# Patient Record
Sex: Female | Born: 1940 | Race: White | Hispanic: No | Smoking: Former smoker
Health system: Southern US, Community
[De-identification: ages and names within clinical notes are randomized; demographics above are authoritative.]

## PROBLEM LIST (undated history)

## (undated) DIAGNOSIS — K2970 Gastritis, unspecified, without bleeding: Secondary | ICD-10-CM

## (undated) DIAGNOSIS — K449 Diaphragmatic hernia without obstruction or gangrene: Secondary | ICD-10-CM

## (undated) DIAGNOSIS — N189 Chronic kidney disease, unspecified: Principal | ICD-10-CM

## (undated) DIAGNOSIS — H269 Unspecified cataract: Secondary | ICD-10-CM

## (undated) DIAGNOSIS — I10 Essential (primary) hypertension: Secondary | ICD-10-CM

## (undated) DIAGNOSIS — K56609 Unspecified intestinal obstruction, unspecified as to partial versus complete obstruction: Secondary | ICD-10-CM

## (undated) DIAGNOSIS — E669 Obesity, unspecified: Secondary | ICD-10-CM

## (undated) DIAGNOSIS — E785 Hyperlipidemia, unspecified: Secondary | ICD-10-CM

## (undated) DIAGNOSIS — N200 Calculus of kidney: Secondary | ICD-10-CM

## (undated) DIAGNOSIS — M549 Dorsalgia, unspecified: Secondary | ICD-10-CM

## (undated) DIAGNOSIS — I319 Disease of pericardium, unspecified: Secondary | ICD-10-CM

## (undated) DIAGNOSIS — R011 Cardiac murmur, unspecified: Secondary | ICD-10-CM

## (undated) DIAGNOSIS — M3500 Sicca syndrome, unspecified: Secondary | ICD-10-CM

## (undated) DIAGNOSIS — E538 Deficiency of other specified B group vitamins: Secondary | ICD-10-CM

## (undated) DIAGNOSIS — M8700 Idiopathic aseptic necrosis of unspecified bone: Secondary | ICD-10-CM

## (undated) DIAGNOSIS — E119 Type 2 diabetes mellitus without complications: Secondary | ICD-10-CM

## (undated) DIAGNOSIS — F419 Anxiety disorder, unspecified: Secondary | ICD-10-CM

## (undated) DIAGNOSIS — D631 Anemia in chronic kidney disease: Principal | ICD-10-CM

## (undated) DIAGNOSIS — I4891 Unspecified atrial fibrillation: Secondary | ICD-10-CM

## (undated) DIAGNOSIS — G8929 Other chronic pain: Secondary | ICD-10-CM

## (undated) DIAGNOSIS — Z87442 Personal history of urinary calculi: Secondary | ICD-10-CM

## (undated) DIAGNOSIS — K219 Gastro-esophageal reflux disease without esophagitis: Secondary | ICD-10-CM

## (undated) DIAGNOSIS — M810 Age-related osteoporosis without current pathological fracture: Secondary | ICD-10-CM

## (undated) DIAGNOSIS — K222 Esophageal obstruction: Secondary | ICD-10-CM

## (undated) DIAGNOSIS — Z9289 Personal history of other medical treatment: Secondary | ICD-10-CM

## (undated) DIAGNOSIS — Z950 Presence of cardiac pacemaker: Secondary | ICD-10-CM

## (undated) DIAGNOSIS — D649 Anemia, unspecified: Secondary | ICD-10-CM

## (undated) DIAGNOSIS — N183 Chronic kidney disease, stage 3 unspecified: Secondary | ICD-10-CM

## (undated) DIAGNOSIS — K31819 Angiodysplasia of stomach and duodenum without bleeding: Secondary | ICD-10-CM

## (undated) DIAGNOSIS — E079 Disorder of thyroid, unspecified: Secondary | ICD-10-CM

## (undated) DIAGNOSIS — Z8739 Personal history of other diseases of the musculoskeletal system and connective tissue: Secondary | ICD-10-CM

## (undated) DIAGNOSIS — F32A Depression, unspecified: Secondary | ICD-10-CM

## (undated) DIAGNOSIS — H409 Unspecified glaucoma: Secondary | ICD-10-CM

## (undated) DIAGNOSIS — M1990 Unspecified osteoarthritis, unspecified site: Secondary | ICD-10-CM

## (undated) DIAGNOSIS — K5790 Diverticulosis of intestine, part unspecified, without perforation or abscess without bleeding: Secondary | ICD-10-CM

## (undated) DIAGNOSIS — F329 Major depressive disorder, single episode, unspecified: Secondary | ICD-10-CM

## (undated) HISTORY — DX: Deficiency of other specified B group vitamins: E53.8

## (undated) HISTORY — PX: REVISION TOTAL KNEE ARTHROPLASTY: SUR1280

## (undated) HISTORY — DX: Unspecified atrial fibrillation: I48.91

## (undated) HISTORY — DX: Unspecified osteoarthritis, unspecified site: M19.90

## (undated) HISTORY — PX: INSERT / REPLACE / REMOVE PACEMAKER: SUR710

## (undated) HISTORY — PX: ABDOMINAL HYSTERECTOMY: SHX81

## (undated) HISTORY — DX: Chronic kidney disease, unspecified: N18.9

## (undated) HISTORY — DX: Disease of pericardium, unspecified: I31.9

## (undated) HISTORY — PX: KNEE ARTHROSCOPY: SHX127

## (undated) HISTORY — PX: CHOLECYSTECTOMY OPEN: SUR202

## (undated) HISTORY — DX: Disorder of thyroid, unspecified: E07.9

## (undated) HISTORY — DX: Unspecified intestinal obstruction, unspecified as to partial versus complete obstruction: K56.609

## (undated) HISTORY — DX: Age-related osteoporosis without current pathological fracture: M81.0

## (undated) HISTORY — DX: Esophageal obstruction: K22.2

## (undated) HISTORY — DX: Diaphragmatic hernia without obstruction or gangrene: K44.9

## (undated) HISTORY — DX: Angiodysplasia of stomach and duodenum without bleeding: K31.819

## (undated) HISTORY — DX: Unspecified glaucoma: H40.9

## (undated) HISTORY — PX: JOINT REPLACEMENT: SHX530

## (undated) HISTORY — PX: ESOPHAGOGASTRODUODENOSCOPY (EGD) WITH ESOPHAGEAL DILATION: SHX5812

## (undated) HISTORY — PX: TOTAL KNEE ARTHROPLASTY: SHX125

## (undated) HISTORY — PX: EYE SURGERY: SHX253

## (undated) HISTORY — DX: Gastritis, unspecified, without bleeding: K29.70

## (undated) HISTORY — DX: Diverticulosis of intestine, part unspecified, without perforation or abscess without bleeding: K57.90

## (undated) HISTORY — PX: COLECTOMY: SHX59

## (undated) HISTORY — DX: Anemia in chronic kidney disease: D63.1

---

## 1997-03-06 ENCOUNTER — Encounter: Payer: Self-pay | Admitting: Internal Medicine

## 1999-03-24 ENCOUNTER — Inpatient Hospital Stay (HOSPITAL_COMMUNITY): Admission: RE | Admit: 1999-03-24 | Discharge: 1999-03-31 | Payer: Self-pay | Admitting: Orthopedic Surgery

## 1999-03-24 ENCOUNTER — Encounter: Payer: Self-pay | Admitting: Orthopedic Surgery

## 1999-03-30 ENCOUNTER — Encounter: Payer: Self-pay | Admitting: Orthopedic Surgery

## 1999-03-31 ENCOUNTER — Inpatient Hospital Stay (HOSPITAL_COMMUNITY)
Admission: RE | Admit: 1999-03-31 | Discharge: 1999-04-05 | Payer: Self-pay | Admitting: Physical Medicine & Rehabilitation

## 1999-04-02 ENCOUNTER — Encounter: Payer: Self-pay | Admitting: Physical Medicine & Rehabilitation

## 1999-08-01 ENCOUNTER — Encounter: Payer: Self-pay | Admitting: *Deleted

## 1999-08-01 ENCOUNTER — Encounter: Admission: RE | Admit: 1999-08-01 | Discharge: 1999-08-01 | Payer: Self-pay | Admitting: *Deleted

## 2001-01-13 ENCOUNTER — Encounter: Admission: RE | Admit: 2001-01-13 | Discharge: 2001-01-13 | Payer: Self-pay | Admitting: Internal Medicine

## 2001-01-13 ENCOUNTER — Encounter: Payer: Self-pay | Admitting: Internal Medicine

## 2001-09-30 ENCOUNTER — Other Ambulatory Visit: Admission: RE | Admit: 2001-09-30 | Discharge: 2001-09-30 | Payer: Self-pay | Admitting: Internal Medicine

## 2002-11-22 ENCOUNTER — Ambulatory Visit (HOSPITAL_COMMUNITY): Admission: RE | Admit: 2002-11-22 | Discharge: 2002-11-22 | Payer: Self-pay | Admitting: Internal Medicine

## 2003-01-12 ENCOUNTER — Ambulatory Visit (HOSPITAL_COMMUNITY): Admission: RE | Admit: 2003-01-12 | Discharge: 2003-01-12 | Payer: Self-pay | Admitting: Internal Medicine

## 2003-01-18 ENCOUNTER — Encounter: Payer: Self-pay | Admitting: Internal Medicine

## 2003-01-18 ENCOUNTER — Encounter: Admission: RE | Admit: 2003-01-18 | Discharge: 2003-01-18 | Payer: Self-pay | Admitting: Internal Medicine

## 2003-01-31 ENCOUNTER — Encounter: Admission: RE | Admit: 2003-01-31 | Discharge: 2003-03-28 | Payer: Self-pay | Admitting: Internal Medicine

## 2003-07-26 ENCOUNTER — Other Ambulatory Visit: Admission: RE | Admit: 2003-07-26 | Discharge: 2003-07-26 | Payer: Self-pay | Admitting: Internal Medicine

## 2003-08-07 ENCOUNTER — Encounter: Admission: RE | Admit: 2003-08-07 | Discharge: 2003-08-07 | Payer: Self-pay | Admitting: Internal Medicine

## 2004-04-25 ENCOUNTER — Encounter: Admission: RE | Admit: 2004-04-25 | Discharge: 2004-04-25 | Payer: Self-pay | Admitting: Internal Medicine

## 2005-03-24 ENCOUNTER — Other Ambulatory Visit: Admission: RE | Admit: 2005-03-24 | Discharge: 2005-03-24 | Payer: Self-pay | Admitting: Internal Medicine

## 2007-01-24 ENCOUNTER — Encounter: Admission: RE | Admit: 2007-01-24 | Discharge: 2007-01-24 | Payer: Self-pay | Admitting: Internal Medicine

## 2007-08-17 ENCOUNTER — Encounter: Payer: Self-pay | Admitting: Internal Medicine

## 2007-09-26 ENCOUNTER — Other Ambulatory Visit: Admission: RE | Admit: 2007-09-26 | Discharge: 2007-09-26 | Payer: Self-pay | Admitting: Family Medicine

## 2007-09-26 ENCOUNTER — Encounter: Payer: Self-pay | Admitting: Internal Medicine

## 2007-10-03 ENCOUNTER — Encounter: Payer: Self-pay | Admitting: Internal Medicine

## 2007-10-25 ENCOUNTER — Telehealth: Payer: Self-pay | Admitting: Internal Medicine

## 2007-11-21 DIAGNOSIS — E669 Obesity, unspecified: Secondary | ICD-10-CM | POA: Insufficient documentation

## 2007-11-21 DIAGNOSIS — K56609 Unspecified intestinal obstruction, unspecified as to partial versus complete obstruction: Secondary | ICD-10-CM | POA: Insufficient documentation

## 2007-11-21 DIAGNOSIS — M1990 Unspecified osteoarthritis, unspecified site: Secondary | ICD-10-CM | POA: Insufficient documentation

## 2007-11-21 DIAGNOSIS — Z862 Personal history of diseases of the blood and blood-forming organs and certain disorders involving the immune mechanism: Secondary | ICD-10-CM | POA: Insufficient documentation

## 2007-11-21 DIAGNOSIS — Z8639 Personal history of other endocrine, nutritional and metabolic disease: Secondary | ICD-10-CM

## 2007-11-21 DIAGNOSIS — E538 Deficiency of other specified B group vitamins: Secondary | ICD-10-CM | POA: Insufficient documentation

## 2007-11-21 DIAGNOSIS — K449 Diaphragmatic hernia without obstruction or gangrene: Secondary | ICD-10-CM | POA: Insufficient documentation

## 2007-11-21 DIAGNOSIS — Z8679 Personal history of other diseases of the circulatory system: Secondary | ICD-10-CM | POA: Insufficient documentation

## 2007-11-21 DIAGNOSIS — K5730 Diverticulosis of large intestine without perforation or abscess without bleeding: Secondary | ICD-10-CM | POA: Insufficient documentation

## 2007-11-21 DIAGNOSIS — K222 Esophageal obstruction: Secondary | ICD-10-CM | POA: Insufficient documentation

## 2007-11-21 DIAGNOSIS — Z8719 Personal history of other diseases of the digestive system: Secondary | ICD-10-CM | POA: Insufficient documentation

## 2007-11-22 ENCOUNTER — Ambulatory Visit: Payer: Self-pay | Admitting: Internal Medicine

## 2008-01-04 ENCOUNTER — Inpatient Hospital Stay (HOSPITAL_COMMUNITY): Admission: RE | Admit: 2008-01-04 | Discharge: 2008-01-09 | Payer: Self-pay | Admitting: Orthopedic Surgery

## 2009-01-09 ENCOUNTER — Inpatient Hospital Stay (HOSPITAL_COMMUNITY): Admission: RE | Admit: 2009-01-09 | Discharge: 2009-01-14 | Payer: Self-pay | Admitting: Orthopedic Surgery

## 2009-01-14 ENCOUNTER — Encounter (INDEPENDENT_AMBULATORY_CARE_PROVIDER_SITE_OTHER): Payer: Self-pay | Admitting: *Deleted

## 2009-01-26 ENCOUNTER — Inpatient Hospital Stay (HOSPITAL_COMMUNITY): Admission: EM | Admit: 2009-01-26 | Discharge: 2009-02-01 | Payer: Self-pay | Admitting: Emergency Medicine

## 2009-01-26 ENCOUNTER — Encounter (INDEPENDENT_AMBULATORY_CARE_PROVIDER_SITE_OTHER): Payer: Self-pay | Admitting: *Deleted

## 2009-01-29 ENCOUNTER — Encounter (INDEPENDENT_AMBULATORY_CARE_PROVIDER_SITE_OTHER): Payer: Self-pay | Admitting: *Deleted

## 2009-02-12 ENCOUNTER — Encounter: Payer: Self-pay | Admitting: Internal Medicine

## 2009-02-13 ENCOUNTER — Encounter: Payer: Self-pay | Admitting: Internal Medicine

## 2009-02-13 ENCOUNTER — Encounter (INDEPENDENT_AMBULATORY_CARE_PROVIDER_SITE_OTHER): Payer: Self-pay | Admitting: *Deleted

## 2009-02-15 ENCOUNTER — Ambulatory Visit (HOSPITAL_COMMUNITY): Admission: RE | Admit: 2009-02-15 | Discharge: 2009-02-15 | Payer: Self-pay | Admitting: Internal Medicine

## 2009-02-15 ENCOUNTER — Encounter (INDEPENDENT_AMBULATORY_CARE_PROVIDER_SITE_OTHER): Payer: Self-pay | Admitting: *Deleted

## 2009-02-19 ENCOUNTER — Ambulatory Visit: Payer: Self-pay | Admitting: Internal Medicine

## 2009-02-19 DIAGNOSIS — K630 Abscess of intestine: Secondary | ICD-10-CM | POA: Insufficient documentation

## 2009-02-21 ENCOUNTER — Telehealth: Payer: Self-pay | Admitting: Internal Medicine

## 2009-02-27 ENCOUNTER — Encounter: Payer: Self-pay | Admitting: Internal Medicine

## 2009-02-27 ENCOUNTER — Encounter (INDEPENDENT_AMBULATORY_CARE_PROVIDER_SITE_OTHER): Payer: Self-pay | Admitting: *Deleted

## 2009-03-22 ENCOUNTER — Ambulatory Visit: Payer: Self-pay | Admitting: Internal Medicine

## 2009-03-26 ENCOUNTER — Telehealth: Payer: Self-pay | Admitting: Internal Medicine

## 2009-03-27 ENCOUNTER — Ambulatory Visit: Payer: Self-pay | Admitting: Internal Medicine

## 2009-03-27 DIAGNOSIS — R1900 Intra-abdominal and pelvic swelling, mass and lump, unspecified site: Secondary | ICD-10-CM | POA: Insufficient documentation

## 2009-03-29 ENCOUNTER — Encounter: Payer: Self-pay | Admitting: Internal Medicine

## 2009-04-09 ENCOUNTER — Ambulatory Visit: Admission: RE | Admit: 2009-04-09 | Discharge: 2009-04-09 | Payer: Self-pay | Admitting: Gynecologic Oncology

## 2009-06-03 ENCOUNTER — Ambulatory Visit: Payer: Self-pay | Admitting: Internal Medicine

## 2009-07-16 ENCOUNTER — Ambulatory Visit (HOSPITAL_COMMUNITY): Admission: RE | Admit: 2009-07-16 | Discharge: 2009-07-16 | Payer: Self-pay | Admitting: Gynecologic Oncology

## 2009-07-17 ENCOUNTER — Ambulatory Visit: Admission: RE | Admit: 2009-07-17 | Discharge: 2009-07-17 | Payer: Self-pay | Admitting: Gynecologic Oncology

## 2009-07-17 ENCOUNTER — Encounter: Payer: Self-pay | Admitting: Internal Medicine

## 2009-07-23 ENCOUNTER — Telehealth: Payer: Self-pay | Admitting: Internal Medicine

## 2009-08-02 ENCOUNTER — Telehealth: Payer: Self-pay | Admitting: Internal Medicine

## 2009-08-08 ENCOUNTER — Telehealth: Payer: Self-pay | Admitting: Internal Medicine

## 2009-08-15 ENCOUNTER — Encounter (INDEPENDENT_AMBULATORY_CARE_PROVIDER_SITE_OTHER): Payer: Self-pay | Admitting: *Deleted

## 2009-08-15 ENCOUNTER — Encounter: Admission: RE | Admit: 2009-08-15 | Discharge: 2009-08-15 | Payer: Self-pay | Admitting: Family Medicine

## 2009-08-22 ENCOUNTER — Telehealth: Payer: Self-pay | Admitting: Internal Medicine

## 2009-08-22 ENCOUNTER — Encounter: Payer: Self-pay | Admitting: Internal Medicine

## 2009-09-26 ENCOUNTER — Ambulatory Visit: Admission: RE | Admit: 2009-09-26 | Discharge: 2009-09-26 | Payer: Self-pay | Admitting: Gynecologic Oncology

## 2009-10-08 ENCOUNTER — Inpatient Hospital Stay (HOSPITAL_COMMUNITY): Admission: RE | Admit: 2009-10-08 | Discharge: 2009-10-23 | Payer: Self-pay | Admitting: General Surgery

## 2009-10-08 ENCOUNTER — Encounter (INDEPENDENT_AMBULATORY_CARE_PROVIDER_SITE_OTHER): Payer: Self-pay | Admitting: General Surgery

## 2009-10-18 ENCOUNTER — Ambulatory Visit: Payer: Self-pay | Admitting: Physical Medicine & Rehabilitation

## 2009-10-25 ENCOUNTER — Emergency Department (HOSPITAL_COMMUNITY)
Admission: EM | Admit: 2009-10-25 | Discharge: 2009-10-25 | Payer: Self-pay | Source: Home / Self Care | Admitting: Emergency Medicine

## 2009-10-29 ENCOUNTER — Ambulatory Visit (HOSPITAL_COMMUNITY): Admission: RE | Admit: 2009-10-29 | Discharge: 2009-10-29 | Payer: Self-pay | Admitting: Internal Medicine

## 2010-01-27 ENCOUNTER — Encounter: Admission: RE | Admit: 2010-01-27 | Discharge: 2010-01-27 | Payer: Self-pay | Admitting: General Surgery

## 2010-03-19 ENCOUNTER — Inpatient Hospital Stay (HOSPITAL_COMMUNITY): Admission: RE | Admit: 2010-03-19 | Discharge: 2010-03-23 | Payer: Self-pay | Admitting: General Surgery

## 2010-03-19 ENCOUNTER — Encounter (INDEPENDENT_AMBULATORY_CARE_PROVIDER_SITE_OTHER): Payer: Self-pay | Admitting: General Surgery

## 2010-07-22 NOTE — Consult Note (Signed)
Summary: Abdominal Pain  NAME:  Tricia Gonzales, Tricia Gonzales NO.:  [id number]      MEDICAL RECORD NO.:  [id number]          PATIENT TYPE:  INP      LOCATION:  1522                         FACILITY:  [facility]      PHYSICIAN:  Marland Kitchen T. Hoxworth, M.D.DATE OF BIRTH:  1941/02/21      DATE OF CONSULTATION:  01/29/2009   DATE OF DISCHARGE:                                    CONSULTATION      REFERRING PHYSICIAN:  [facility] hospitalist.      CHIEF COMPLAINT:  Abdominal pain.      HISTORY:  I was asked by the [facility] hospitalist to evaluate Tricia Gonzales.   She is a very pleasant 70 year old white female with a number of   significant medical problems who was admitted to the hospital on August   8 with abdominal pain.  The patient states she has a history of   diverticulitis about 15 years ago which was treated with outpatient   antibiotics.  She underwent colonoscopy at that time.  Since then she   has had minimal difficulty with just occasional mild lower abdominal   pain that she will manage by restricting her diet and it quickly   resolves.  She has not required any further medical treatment until now.      The patient is status post recent redo left total knee replacement by   Dr. Wynelle Link on July 21.  She states that around the time of that   surgery, probably due to pain medication, she developed some   constipation and then had some recurrent pain in her lower abdomen left   greater than right.  This started significantly about 5-6 days prior to   admission.  It was persistent pain, moderately severe.  She was being   seen by home health from her knee surgery and some at least low grade   fever was documented.  She presented for evaluation and was admitted on   August 8 based on CT findings of diverticulitis and abscess as described   below.      The patient has been on IV antibiotics since admission and states she is   feeling much better than admission with now relatively mild  occasional   pain.  She has had some loose stools since admission.  No current fever   or chills.  Tolerating liquids without difficulty.  She has not noted   any melena or hematochezia.  Last colonoscopy was about 12-15 years ago.      PAST MEDICAL HISTORY:  Medically she is followed for:   1. Morbid obesity.   2. Hypertension.   3. Type 2 diabetes.   4. Hyperlipidemia.      SURGERY:  Includes previous open cholecystectomy years ago and previous   knee replacements on each side.      MEDICATIONS ON ADMISSION:   1. She was on Coumadin for prophylaxis following her knee replacement.   2. Glipizide 10 mg b.i.d.   3. Neurontin 300 b.i.d.   4. Metoprolol 100  mg daily.   5. Cardura 4 mg daily.   6. Robaxin p.r.n.   7. Lasix 40 mg daily.   8. Trental 400 mg daily.   9. Protonix 40 daily.   10.Trazodone 50 at night.   11.Zocor 20 mg daily.   12.Since admission she is on Cipro 400 mg IV every 12 hours.   13.Flagyl 500 mg IV every 8 hours.      ALLERGIES:  NORVASC, METFORMIN AND CONTRAST DYE.      SOCIAL HISTORY:  Lives alone.  Does not smoke cigarettes, drinks   moderate amount of alcohol.      FAMILY HISTORY:  Noncontributory.      REVIEW OF SYSTEMS:  GENERAL:  Some malaise and low grade fever with this   illness.  HEENT:  Denies vision, hearing, swallowing problems.   RESPIRATORY:  Denies shortness of breath, cough, wheezing.  CARDIAC:   Denies chest pain, palpitations, history of heart disease.  ABDOMEN/GI:   As above.  GU:  She has had a moderate amount of urinary hesitancy with   her knee replacement.  No burning, frequency, pneumaturia.  EXTREMITIES:   Positive arthritis pain, difficult mobility.  HEMATOLOGIC:  Denies   history of blood clots, abnormal bleeding.      PHYSICAL EXAM:  Temperature is 98.6, respirations 16, heart rate 72,   blood pressure 102/62.   GENERAL:  Pleasant, morbidly obese female, no acute distress.   SKIN:  Warm, dry.  No rash or infection.    HEENT:  No palpable mass or thyromegaly.  Sclerae nonicteric.   Oropharynx clear.   LYMPH NODES.  No cervical, subclavicular or inguinal nodes palpable.   LUNGS:  Clear without wheezing or increased work of breathing.   CARDIAC:  Regular rate and rhythm.  No significant edema or JVD noted.   ABDOMEN:  Obese.  No hernias.  Well-healed Kocher incision.  Soft   without apparent tenderness.  No discernible masses, but limited due to   obesity.   EXTREMITIES:  Healing incision left knee with minimal erythema.   NEUROLOGIC:  Alert, oriented.  Motor and sensory exams grossly normal.      LABORATORY:  Currently white count is 9.4, hemoglobin is 86.   Electrolytes, BUN, creatinine unremarkable.  Stool for occult blood was   negative.  CA-125 within normal limits.      IMAGING:  CT scan of the abdomen and pelvis was reviewed.  This shows   diverticulosis of the left and sigmoid colon.  There is a 4 x 4.1 cm   collection of air and fluid adjacent to the sigmoid colon consistent   with a pericolonic diverticular abscess.  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      ASSESSMENT/PLAN:  A 70 year old female with multiple medical problems   status post recent total knee replacement.  She has an apparent small   peridiverticular abscess.  This was not felt amenable to percutaneous   drainage by Interventional Radiology but is small enough that it could   very likely be adequately treated with antibiotics alone.  She has never   been hospitalized for diverticulitis previously and has had minimal   ongoing symptoms.  She would be at increased risk for colectomy due to   her morbid obesity and other medical problems.   1. At this point I  would not recommend surgery unless her abscess       fails to resolve or she has ongoing significant symptoms.   2. She will require a colonoscopy once this  resolves.   3. She will require further evaluation of her ovarian mass which is       being arranged by the hospitalist team.   4. I would think she is nearing time for discharge and could follow up       with oral antibiotics and repeat CT scan as an outpatient in 2-3       weeks and I would be happy to see her back in the office at that       time.  We will follow with you.               Darene Lamer. Hoxworth, M.D.   Electronically Signed            BTH/MEDQ  D:  01/29/2009  T:  01/29/2009  Job:  [id number]

## 2010-07-22 NOTE — Assessment & Plan Note (Signed)
Summary: f/u./...em   History of Present Illness Visit Type: follow up  Primary GI MD: Delfin Edis MD Primary Provider: Kelton Pillar, MD Requesting Provider: n/a Chief Complaint: F/u from arm and shoulder pain from antibiotics. Pt states that she is better and denies any GI complaints  History of Present Illness:   This is a 70 year old white female with a history of diverticulitis and a diverticular abscess in July 2010. She is status post hospitalization and surgical evaluation. Patient has improved on several courses of antibiotics and did not need surgery. She is a high risk for surgery because of her obesity and other medical issues. Her last CT scan of the abdomen in October 2010 showed almost complete resolution of the abscess but a tiny residual collection filled with contrast connecting with the colon consistent with a contained leak. She has a pelvic mass and is seen by Dr.Soper and followed with vaginal ultrasound. Our last attempt for a colonoscopy in 1998 showed severe diverticulosis of the left colon. It was an incomplete exam. A subsequent barium enema showed severe diverticulosis. Patient is due for a repeat colonoscopy.   GI Review of Systems      Denies abdominal pain, acid reflux, belching, bloating, chest pain, dysphagia with liquids, dysphagia with solids, heartburn, loss of appetite, nausea, vomiting, vomiting blood, weight loss, and  weight gain.        Denies anal fissure, black tarry stools, change in bowel habit, constipation, diarrhea, diverticulosis, fecal incontinence, heme positive stool, hemorrhoids, irritable bowel syndrome, jaundice, light color stool, liver problems, rectal bleeding, and  rectal pain.    Current Medications (verified): 1)  Vicodin 5-500 Mg  Tabs (Hydrocodone-Acetaminophen) .... As Needed 2)  Glipizide 10 Mg  Tabs (Glipizide) .... Two Times A Day 3)  Actos 30 Mg  Tabs (Pioglitazone Hcl) .... Once Daily 4)  Metoprolol Succinate 50 Mg  Tb24  (Metoprolol Succinate) .... Two Times A Day 5)  Cardura 4 Mg  Tabs (Doxazosin Mesylate) .... Once Daily 6)  Furosemide 40 Mg  Tabs (Furosemide) .... Once Daily 7)  Trental 400 Mg  Tbcr (Pentoxifylline) .... Once Daily 8)  Baby Aspirin 81 Mg  Chew (Aspirin) .... Once Daily 9)  Protonix 40 Mg  Tbec (Pantoprazole Sodium) .... Once Daily 10)  Neurontin 300 Mg  Caps (Gabapentin) .... Two Times A Day 11)  Zocor 20 Mg  Tabs (Simvastatin) .... Take 1/2 Daily 12)  Trazamine 50 Mg  Misc (Trazodone & Diet Manage Prod) .... Take 1-2 Daily  Allergies (verified): 1)  ! Norvasc  Past History:  Past Medical History: Reviewed history from 11/21/2007 and no changes required. Current Problems:  DIABETES MELLITUS, HX OF (ICD-V12.2) HYPERTENSION, HX OF (ICD-V12.50) OSTEOARTHRITIS (ICD-715.90) ANEMIA, HX OF (ICD-V12.3) Hx of UNSPECIFIED INTESTINAL OBSTRUCTION (ICD-560.9) HIATAL HERNIA (ICD-553.3) REFLUX ESOPHAGITIS, HX OF (ICD-V12.79) DIVERTICULOSIS OF COLON (ICD-562.10) Hx of ESOPHAGEAL STRICTURE (ICD-530.3) OBESITY (ICD-278.00) VITAMIN B12 DEFICIENCY (ICD-266.2)  Past Surgical History: Reviewed history from 11/21/2007 and no changes required. cholecystectomy knee surgery  Family History: Reviewed history from 02/19/2009 and no changes required. Family History of Diabetes: Mother and brother  No FH of Colon Cancer:  Social History: Reviewed history from 02/19/2009 and no changes required. Patient is a former smoker.  Occupation: Electrical engineer Alcohol Use - yes Daily Caffeine Use Patient does not get regular exercise.   Review of Systems  The patient denies allergy/sinus, anemia, anxiety-new, arthritis/joint pain, back pain, blood in urine, breast changes/lumps, change in vision, confusion, cough, coughing up blood,  depression-new, fainting, fatigue, fever, headaches-new, hearing problems, heart murmur, heart rhythm changes, itching, menstrual pain, muscle pains/cramps, night sweats,  nosebleeds, pregnancy symptoms, shortness of breath, skin rash, sleeping problems, sore throat, swelling of feet/legs, swollen lymph glands, thirst - excessive , urination - excessive , urination changes/pain, urine leakage, vision changes, and voice change.         Pertinent positive and negative review of systems were noted in the above HPI. All other ROS was otherwise negative.   Vital Signs:  Patient profile:   70 year old female Height:      60 inches Weight:      256 pounds BMI:     50.18 BSA:     2.07 Pulse rate:   62 / minute Pulse rhythm:   regular BP sitting:   126 / 72  (left arm) Cuff size:   large  Vitals Entered By: Hope Pigeon CMA (June 03, 2009 11:44 AM)  Physical Exam  General:  Well developed, well nourished, no acute distress. Eyes:  PERRLA, no icterus. Mouth:  No deformity or lesions, dentition normal. Neck:  Supple; no masses or thyromegaly. Lungs:  Clear throughout to auscultation. Heart:  Regular rate and rhythm; no murmurs, rubs,  or bruits. Abdomen:  obese soft abdomen with well-healed surgical scars and normoactive bowel sounds. No tenderness and no palpable mass. Skin:  Intact without significant lesions or rashes. Psych:  Alert and cooperative. Normal mood and affect.   Impression & Recommendations:  Problem # 1:  ABSCESS OF INTESTINE (ICD-569.5) Patient is status post diverticular abscess with  resolution. She has had severe diverticulosis of the left column since 1998. Patient is doing well and remains asymptomatic. She will increase her fiber intake and will call us if the pain recurs. She has amoxicillin at home which she may take at the onset of pain. Patient is due for a colonoscopy at this time. However, we will wait until March 2011 to send her a recall letter. We will have to use a pediatric scope .  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 She has a vaginal sonogram scheduled for  06/2009.  Patient Instructions: 1)  recall colonoscopy March 2011; plan to use pediatric scope. 2)  Increase fiber intake. 3)  Call if abdominal pain recurs. 4)  Vaginal ultrasound January 2011 scheduled for followup of pelvic mass. 5)  Copy sent to : Dr Kelton Pillar, Dr Clarene Essex

## 2010-07-22 NOTE — Letter (Signed)
Summary: Tricia Gonzales  Kelton Pillar, MD   Imported By: Awilda Bill CMA (AAMA) 02/14/2009 10:47:48  _____________________________________________________________________  External Attachment:    Type:   Image     Comment:   External Document

## 2010-07-22 NOTE — Progress Notes (Signed)
Summary: TRIAGE  Phone Note Call from Patient Call back at Home Phone [phone]   Call For: Dr Olevia Perches Reason for Call: Talk to Nurse Summary of Call: Has been on Flagyl for 7days for her flare up and she feels that she is not any better. What else can she do?  Initial call taken by: Irwin Brakeman [facility],  August 02, 2009 9:14 AM  Follow-up for Phone Call        See triage from 07-23-09.Now with very low abd. pain and pressure. Denies constipation, diarrhea, blood, black stools, n/v. States she has trouble voiding for several days, frequency and some painful urination.   1) Please call your PCP ASAP, sounds like a UTI. 2) Complete your antibiotics we ordered and callback with an update on Monday. 3) If symptoms become worse call back immediately or go to ER.  Follow-up by: Vivia Ewing LPN,  August 02, 2261 9:33 AM  Additional Follow-up for Phone Call Additional follow up Details #1::        OK Additional Follow-up by: Lafayette Dragon MD,  August 02, 2009 4:06 PM

## 2010-07-22 NOTE — Letter (Signed)
Summary: [facility]   Imported By: Bubba Hales 04/05/2009 13:49:04  _____________________________________________________________________  External Attachment:    Type:   Image     Comment:   External Document

## 2010-07-22 NOTE — Progress Notes (Signed)
Summary: flare up  Phone Note Call from Patient Call back at Home Phone [phone]   Call For: Dr Olevia Perches Reason for Call: Talk to Nurse Summary of Call: Diverticulitits flare up and was told Dr would order antibiotics for her the next time she got one uses [place]. Can we let her know when it has been done so she can call pharmacy to have them deliver the medicine to her house. Initial call taken by: Irwin Brakeman [facility],  July 23, 2009 11:17 AM  Follow-up for Phone Call        Pain in LLQ that started Saturday night.  She  describes as pressure and sharp. She took a laxative Saturday night due to constipation.  She had some amoxicillin at the house but was 2 pills and she is now out.  Per last office note from Dec she was to start on antibiotics at home and call for further direction.  Dr Olevia Perches Please advise. Follow-up by: Barb Merino RN, [place],  July 23, 2009 12:06 PM  Additional Follow-up for Phone Call Additional follow up Details #1::        Discussed with Dr Olevia Perches patient to resume cipro 500 mg two times a day and Flagyl 250 mg three times a day x 10 days.  Patient  to stay on Miralax.  Patient  is also scheduled for REV with Dr Olevia Perches for 08-26-09 9:15 Additional Follow-up by: Barb Merino RN, CGRN,  July 23, 2009 3:14 PM    New/Updated Medications: CIPROFLOXACIN HCL 500 MG TABS (CIPROFLOXACIN HCL) 1 by mouth two times a day METRONIDAZOLE 250 MG TABS (METRONIDAZOLE) 1 by mouth tid Prescriptions: METRONIDAZOLE 250 MG TABS (METRONIDAZOLE) 1 by mouth tid  #30 x 0   Entered by:   Barb Merino RN, CGRN   Authorized by:   Lafayette Dragon MD   Signed by:   Barb Merino RN, CGRN on 07/23/2009   Method used:   Electronically to        [facility] (retail)       [address], [place]  [id number]       Ph: [phone] or [phone]       Fax: [phone]   RxID:   [id number] CIPROFLOXACIN HCL 500 MG TABS (CIPROFLOXACIN HCL) 1 by  mouth two times a day  #20 x 0   Entered by:   Barb Merino RN, CGRN   Authorized by:   Lafayette Dragon MD   Signed by:   Barb Merino RN, CGRN on 07/23/2009   Method used:   Electronically to        [facility] (retail)       [address], [place]  [id number]       Ph: [phone] or [phone]       Fax: [phone]   RxID:   [id number]

## 2010-07-22 NOTE — Procedures (Signed)
Summary: Gastroenterology colon  Gastroenterology colon   Imported By: Awilda Bill CMA 11/21/2007 16:00:47  _____________________________________________________________________  External Attachment:    Type:   Image     Comment:   External Document

## 2010-07-22 NOTE — Letter (Signed)
Summary: [facility] progress report  [facility] progress report   Imported By: Marlon Pel CMA ([place]) 03/27/2009 09:30:54  _____________________________________________________________________  External Attachment:    Type:   Image     Comment:   External Document

## 2010-07-22 NOTE — Medication Information (Signed)
Summary: RX for Taentail/Semrastor/Glipizedxt/triosemide/Gluccostiol/[place]  RX for Taentail/Semrastor/Glipizedxt/triosemide/Gluccostiol/[facility]   Imported By: Adonis Huguenin 12/09/2007 11:10:08  _____________________________________________________________________  External Attachment:    Type:   Image     Comment:   External Document

## 2010-07-22 NOTE — Assessment & Plan Note (Signed)
Summary: NO APPETITE/DIARRHEA/FH   History of Present Illness Visit Type: consult  Primary GI MD: Delfin Edis MD Primary Provider: Kelton Pillar, MD Requesting Provider: Kelton Pillar, MD Chief Complaint: Pt states that she is here to f/u from CT. Pt denies any GI complaints History of Present Illness:   This is a 70 year old white female who has a history of constipation and severe left colon diverticulosis which was diagnosed on a colonoscopy in 1998. At that time, her colonoscopy exam was incomplete due to severe narrowing of the sigmoid colon. A subsequent barium enema showed severe tortuosity of the left colon with some narrowing. Patient was then recovering from an attack of diverticulitis. She has no family history of colon cancer. Patient was treated in 1998 for an esphageal stricture and gastroesophageal reflux. Patient was recently hospitalized due to a knee replacement. At that time, she began having abdominal pain. A CT scan was done and showed an apparent small peridiverticular abscess that was 4cmx4cm.  This was not felt amenable to percutaneous drainage by Interventional Radiology but was small enough that it could very likely be adequately treated with antibiotics alone. She was started on Cipro and Flagyl. She has never been hospitalized for diverticulitis previously and has had minimal ongoing symptoms. She was felt to be at increased risk for colectomy due to her morbid obesity and other medical problems.  Patient was later released from the hospital much improved. She comes today for a follow up. A repeat CT scan of the pelvis showed enlargement of the diverticular abscess from 4-6 cm in diameter. There was extraluminal fluid and gas collection in the central sigmoid colon. 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 Patient restarted her Flagyl and Cipro 3 days ago.   GI Review of Systems      Denies abdominal pain, acid reflux,  belching, bloating, chest pain, dysphagia with liquids, dysphagia with solids, heartburn, loss of appetite, nausea, vomiting, vomiting blood, weight loss, and  weight gain.        Denies anal fissure, black tarry stools, change in bowel habit, constipation, diarrhea, diverticulosis, fecal incontinence, heme positive stool, hemorrhoids, irritable bowel syndrome, jaundice, light color stool, liver problems, rectal bleeding, and  rectal pain.    Current Medications (verified): 1)  Vicodin 5-500 Mg  Tabs (Hydrocodone-Acetaminophen) .... As Needed 2)  Glipizide 10 Mg  Tabs (Glipizide) .... Two Times A Day 3)  Actos 30 Mg  Tabs (Pioglitazone Hcl) .... Once Daily 4)  Metoprolol Succinate 50 Mg  Tb24 (Metoprolol Succinate) .... Two Times A Day 5)  Cardura 4 Mg  Tabs (Doxazosin Mesylate) .... Once Daily 6)  Furosemide 40 Mg  Tabs (Furosemide) .... Once Daily 7)  Trental 400 Mg  Tbcr (Pentoxifylline) .... Once Daily 8)  Baby Aspirin 81 Mg  Chew (Aspirin) .... Once Daily 9)  Protonix 40 Mg  Tbec (Pantoprazole Sodium) .... Once Daily 10)  Neurontin 300 Mg  Caps (Gabapentin) .... Two Times A Day 11)  Zocor 20 Mg  Tabs (Simvastatin) .... Take 1/2 Daily 12)  Trazamine 50 Mg  Misc (Trazodone & Diet Manage Prod) .... Take 1-2 Daily 13)  Centrum Silver   Tabs (Multiple Vitamins-Minerals) .... Once Daily 14)  Vitamin D 2000 Unit  Tabs (Cholecalciferol) .... Once Daily 15)  Cipro 500 Mg Tabs (Ciprofloxacin Hcl) .... One Tablet By Mouth Two Times A Day 16)  Flagyl 500 Mg Tabs (Metronidazole) .... One Tablet By Mouth Three Times  A Day  Allergies (verified): 1)  ! Norvasc  Past History:  Past Medical History: Reviewed history from 11/21/2007 and no changes required. Current Problems:  DIABETES MELLITUS, HX OF (ICD-V12.2) HYPERTENSION, HX OF (ICD-V12.50) OSTEOARTHRITIS (ICD-715.90) ANEMIA, HX OF (ICD-V12.3) Hx of UNSPECIFIED INTESTINAL OBSTRUCTION (ICD-560.9) HIATAL HERNIA (ICD-553.3) REFLUX  ESOPHAGITIS, HX OF (ICD-V12.79) DIVERTICULOSIS OF COLON (ICD-562.10) Hx of ESOPHAGEAL STRICTURE (ICD-530.3) OBESITY (ICD-278.00) VITAMIN B12 DEFICIENCY (ICD-266.2)  Past Surgical History: Reviewed history from 11/21/2007 and no changes required. cholecystectomy knee surgery  Family History: Family History of Diabetes: Mother and brother  No FH of Colon Cancer:  Social History: Reviewed history from 11/22/2007 and no changes required. Patient is a former smoker.  Occupation: Electrical engineer Alcohol Use - yes Daily Caffeine Use Patient does not get regular exercise.   Review of Systems  The patient denies allergy/sinus, anemia, anxiety-new, arthritis/joint pain, back pain, blood in urine, breast changes/lumps, change in vision, confusion, cough, coughing up blood, depression-new, fainting, fatigue, fever, headaches-new, hearing problems, heart murmur, heart rhythm changes, itching, menstrual pain, muscle pains/cramps, night sweats, nosebleeds, pregnancy symptoms, shortness of breath, skin rash, sleeping problems, sore throat, swelling of feet/legs, swollen lymph glands, thirst - excessive , urination - excessive , urination changes/pain, urine leakage, vision changes, and voice change.         Pertinent positive and negative review of systems were noted in the above HPI. All other ROS was otherwise negative.   Vital Signs:  Patient profile:   70 year old female Height:      60 inches Weight:      268 pounds BMI:     52.53 BSA:     2.12 Pulse rate:   62 / minute Pulse rhythm:   regular BP sitting:   122 / 74  (left arm) Cuff size:   regular  Vitals Entered By: Hope Pigeon [place] (February 19, 2009 4:20 PM)  Physical Exam  General:  morbidly obese, alert and oriented. She moves around with a walker. Eyes:  PERRLA, no icterus. Mouth:  No deformity or lesions, dentition normal. Neck:  Supple; no masses or thyromegaly. Lungs:  Clear throughout to auscultation. Heart:  Regular rate  and rhythm; no murmurs, rubs,  or bruits. Abdomen:  massively obese abdomen with normoactive bowel sounds and focal tenderness in left lower quadrant. There was no rebound. Rectal:  patient declined rectal exam. Extremities:  2+ edema with stasis dermatitis. Psych:  Alert and cooperative. Normal mood and affect.   Impression & Recommendations:  Problem # 1:  DIVERTICULOSIS OF COLON (ICD-562.10) Diverticular abscess in the left parasigmoid space enlarged from 4-6 cm in diameter despite patient feeling clinically improved. She still has periods of nausea and bloating which may be related to Flagyl. We will continue her current antibiotics and repeat the CT scan in 4 weeks. Decrease Flagyl  to 250 mg 3 times a day. If the abscess does not resolve, she will  need percutaneous drainage. Surgical intervention at this point would almost certainly require a colostomy. Dr Excell Seltzer has been seeing her in the hospital and would be the surgeon to do the sigmoid resection.  Problem # 2:  DIABETES MELLITUS, HX OF (ICD-V12.2) continue diabetic medications.  Problem # 3:  HIATAL HERNIA (ICD-553.3) no dysphagia or evidence of recurrent esophageal stricture. Patient is to continue PPI.  Other Orders: GI Misc Procedure/ Radiology Order (GI Misc )  Patient Instructions: 1)  Cipro 500 mg p.o. b.i.d. x 4 more weeks 2)  Flagyl 250 mg  p.o. t.i.d. x 4 weeks ( 500 mg causes nausea) 3)  Repeat CT scan of the pelvis with attention to pelvic abscess in 4 weeks before she sees me in the office. (scheduled for 03/22/09) 4)  Samples of probiotic given to the patient to maintain normal bacteria flora. 5)  Copy sent to : Dr Excell Seltzer, Dr Kelton Pillar Prescriptions: CIPRO 500 MG TABS (CIPROFLOXACIN HCL) one tablet by mouth two times a day x 1 month  #60 x 0   Entered by:   Awilda Bill CMA ([place])   Authorized by:   Lafayette Dragon MD   Signed by:   Awilda Bill CMA ([place]) on 02/20/2009   Method used:   Electronically  to        [facility] (retail)       [address] [place], [place]  [id number]       Ph: [phone] or [phone]       Fax: [phone]   RxID:   [id number] FLAGYL 250 MG TABS (METRONIDAZOLE) Take 1 tablet by mouth three times a day x 1 month  #90 x 0   Entered by:   Awilda Bill CMA ([place])   Authorized by:   Lafayette Dragon MD   Signed by:   Awilda Bill CMA ([place]) on 02/20/2009   Method used:   Electronically to        [facility] (retail)       [address], [place]  [id number]       Ph: [phone] or [phone]       Fax: [phone]   RxID:   [id number]

## 2010-07-22 NOTE — Assessment & Plan Note (Signed)
Summary: CONSULT WITH DR.BRODIE AND SCHEDULE A COLON.  [place]   History of Present Illness Visit Type: new patient Primary GI MD: Delfin Edis MD Primary Provider: Luvenia Heller Chief Complaint: colon cancer screening History of Present Illness:   70 year old white female, history of constipation and severe left colon diverticulosis which was diagnosed on colonoscopy in 1998. At that time. Her colonoscopy exam was incomplete due  to severe narrowing of the sigmoid colon. Subsequent barium enema showed severe tortuosity of the left colon with some narrowing. Patient was then  recovering from an  attack of diverticulitis.. She has no family history of colon cancer and she denies rectal bleeding. She is due for repeat colonoscopy. Patient has a total  knee replacement surgery scheduled  by Dr.Alusio  in July 09. She would like to wait for the colonoscopy till l after the surgery. Patient is a diabetic, on oral hypoglycemic agents,  her bowel habits are generally regular, except for occasional  diarrhea, which she attributes to irritable bowel syndrome. BX seen. pt was treated  in 1998 for esphageal stricture and gastroesophageal  reflux. She has not had any symptoms of upper GI on discomfort,  dysphagia, or odynophagia             Prior Medications Reviewed Using: List Brought by Patient  Updated Prior Medication List: VICODIN 5-500 MG  TABS (HYDROCODONE-ACETAMINOPHEN) as needed GLIPIZIDE 10 MG  TABS (GLIPIZIDE) two times a day ACTOS 30 MG  TABS (PIOGLITAZONE HCL) once daily METOPROLOL SUCCINATE 50 MG  TB24 (METOPROLOL SUCCINATE) two times a day CARDURA 4 MG  TABS (DOXAZOSIN MESYLATE) once daily FUROSEMIDE 40 MG  TABS (FUROSEMIDE) once daily TRENTAL 400 MG  TBCR (PENTOXIFYLLINE) once daily BABY ASPIRIN 81 MG  CHEW (ASPIRIN) once daily PROTONIX 40 MG  TBEC (PANTOPRAZOLE SODIUM) once daily PREDNISONE 1 MG  TABS (PREDNISONE) Take 1 q.o.d. NEURONTIN 300 MG  CAPS (GABAPENTIN) two times a  day ZOCOR 20 MG  TABS (SIMVASTATIN) Take 1/2 daily TRAZAMINE 50 MG  MISC (TRAZODONE & DIET MANAGE PROD) Take 1-2 daily SINGULAIR 10 MG  TABS (MONTELUKAST SODIUM) once daily CENTRUM SILVER   TABS (MULTIPLE VITAMINS-MINERALS) once daily VITAMIN D 2000 UNIT  TABS (CHOLECALCIFEROL) once daily FOLIC ACID 010 MCG  TABS (FOLIC ACID) once daily  Current Allergies (reviewed today): ! NORVASC  Past Medical History:    Reviewed history from 11/21/2007 and no changes required:       Current Problems:        DIABETES MELLITUS, HX OF (ICD-V12.2)       HYPERTENSION, HX OF (ICD-V12.50)       OSTEOARTHRITIS (ICD-715.90)       ANEMIA, HX OF (ICD-V12.3)       Hx of UNSPECIFIED INTESTINAL OBSTRUCTION (ICD-560.9)       HIATAL HERNIA (ICD-553.3)       REFLUX ESOPHAGITIS, HX OF (ICD-V12.79)       DIVERTICULOSIS OF COLON (ICD-562.10)       Hx of ESOPHAGEAL STRICTURE (ICD-530.3)       OBESITY (ICD-278.00)       VITAMIN B12 DEFICIENCY (ICD-266.2)         Past Surgical History:    Reviewed history from 11/21/2007 and no changes required:       cholecystectomy       knee surgery   Family History:    Reviewed history and no changes required:       Family History of Diabetes:   Social History:    Reviewed  history from 11/21/2007 and no changes required:              Patient is a former smoker.        Occupation: Electrical engineer       Alcohol Use - yes       Daily Caffeine Use       Patient does not get regular exercise.    Risk Factors:  Alcohol use:  yes Exercise:  no   Review of Systems       Pertinent positive and negative review of systems were noted in the above HPI. All other ROS was otherwise negative.    Vital Signs:  Patient Profile:   70 Years Old Female Height:     60 inches Weight:      272 pounds BMI:     53.31 Pulse rate:   64 / minute BP sitting:   120 / 68                  Physical Exam  General:     obese.   Neck:     Supple; no masses or  thyromegaly. Lungs:     Clear throughout to auscultation. Heart:     Regular rate and rhythm; no murmurs, rubs,  or bruits. Abdomen:     abdomen was massively obese, but soft and nontender. There was a healed post cholecystectomy scar in right upper quadrant. Her bowel sounds are normal active. I could not elicit any tenderness Rectal:     recent hemoccult cards by Dr Laurann Montana were negative    Impression & Recommendations:  Problem # 1:  DIVERTICULOSIS OF COLON (ICD-562.10) severe sigmoid diverticulosis documented on colonoscopy in 1998, patient is due for another exam. The last exam was complicated by partial obstruction of the left colon necessitating tpatient to have a barium enema. Since the last colonoscop we have had he available pediatric colonoscopes and it is possible, that we may be able to  negotiate through the colon without problems. Also, last colonoscopy was done shortly after she had an attack of diverticulitis, which might have caused some edema in the sigmoid colon making the exam harder.  Because of problems with insurance reimbursement for virtual colonoscopy, I  think trial of endoscopic exam could be preferable .  Problem # 2:  HIATAL HERNIA (ICD-553.3) patient denies any symptoms of gastroesophageal reflux   Patient Instructions: 1)  colonoscopy in the [place]. Patient will receive a recall letter in 9/09 2)  Continue high-fiber diet. 3)  Take fiber supplements  as needed for constipation    ]

## 2010-07-22 NOTE — Progress Notes (Signed)
Summary: [facility] for [place] [facility]   Imported By: Adonis Huguenin 12/09/2007 11:11:14  _____________________________________________________________________  External Attachment:    Type:   Image     Comment:   External Document

## 2010-07-22 NOTE — Consult Note (Signed)
Summary: [facility] for [place] [facility]   Imported By: Adonis Huguenin 12/09/2007 11:07:10  _____________________________________________________________________  External Attachment:    Type:   Image     Comment:   External Document

## 2010-07-22 NOTE — Letter (Signed)
Summary: Consult/[facility]  Consult/[facility]   Imported By: Phillis Knack 08/05/2009 07:46:48  _____________________________________________________________________  External Attachment:    Type:   Image     Comment:   External Document

## 2010-07-22 NOTE — Progress Notes (Signed)
Summary: Condition Update  Phone Note Call from Patient Call back at Home Phone [phone]   Call For: Dr Olevia Perches Summary of Call: Tricia Gonzales all her medicine she was given for Diverticulitis and is still not feeling better. Did Dr want to extended it? Initial call taken by: Irwin Brakeman [facility],  August 08, 2009 10:44 AM  Follow-up for Phone Call        [email or web address] Cipro/Flagyl on Tuesday, took for 10 days for diverticulitis. Has a UTI and is currently  taking Sulfa. She still has intermittent lower abd. discomfort, wants to know if it is still diverticulitis?  1) Complete UTI meds as directed 2) Plenty of fliuds throughout the day.Advance to soft,bland diet x2-4 days. Advanced as tolerated. 3) tylenol/Ibuprofen as needed 4) Heating pad to abdomen as needed. 5) If symptoms become worse call back immediately or go to ER. 6) Pt. to keep scheduled office visit on 08-26-09 at 9:15am 7) I will call pt., if new orders, after MD reviews.   Follow-up by: Vivia Ewing LPN,  August 08, 9824 12:19 PM  Additional Follow-up for Phone Call Additional follow up Details #1::        Doxycycline 100 mg by mouth once daily, # 10, no refill. Additional Follow-up by: Lafayette Dragon MD,  August 08, 2009 12:35 PM    Additional Follow-up for Phone Call Additional follow up Details #2::    Above MD orders reviewed with patient. Pt. to keep scheduled office visit. Pt. instructed to call back as needed.  Follow-up by: Vivia Ewing LPN,  August 08, 4156 1:30 PM  New/Updated Medications: DOXYCYCLINE HYCLATE 100 MG CAPS (DOXYCYCLINE HYCLATE) Take one by mouth daily for 10 days. Prescriptions: DOXYCYCLINE HYCLATE 100 MG CAPS (DOXYCYCLINE HYCLATE) Take one by mouth daily for 10 days.  #10 x 0   Entered by:   Vivia Ewing LPN   Authorized by:   Lafayette Dragon MD   Signed by:   Vivia Ewing LPN on 30/94/0768   Method used:   Electronically to        [facility] (retail)       [address], [place]  [id number]       Ph: [phone] or [phone]       Fax: [phone]   RxID:   [id number]

## 2010-07-22 NOTE — Progress Notes (Signed)
Summary: Wants to discuss doing Colon w/[name] or Dr Olevia Perches  Phone Note Call from Patient Call back at Home Phone [phone]   Call For: Tricia Gonzales Summary of Call: Pt saw Dr Olevia Perches over 10 yrs ago--per pt., pt wants to talk to Dr Olevia Perches or her nurse to discuss doing another Colon does not want to happen what happened before--blockage. Does not want to wait till mid-june to see Dr Olevia Perches to discuss doing this. Is this something she can discuss over the phone w/Dr. Olevia Perches or Neoma Laming? Pt's chart requested from warehouse. Initial call taken by: Denice Paradise,  Oct 25, 2007 3:40 PM  Follow-up for Phone Call        Pt. has not seen Dr.Brodie, "In over ten years." She wants an ov to consult with Dr.Brodie and to schedule a colonoscopy. No GI complaints. 1) NP3 appt. scheduled for 11-22-07_0 :30am 2) Pt. instructed to call back as needed.  Follow-up by: Vivia Ewing LPN,  Oct 25, 8914 9:45 PM

## 2010-07-22 NOTE — Discharge Summary (Signed)
NAME:  Tricia Gonzales, Tricia Gonzales NO.:  [id number]      MEDICAL RECORD NO.:  [id number]          PATIENT TYPE:  INP      LOCATION:  25                         FACILITY:  [facility]      PHYSICIAN:  Gaynelle Arabian, M.D.    DATE OF BIRTH:  [birth year]      DATE OF ADMISSION:  01/09/2009   DATE OF DISCHARGE:  01/14/2009                                  DISCHARGE SUMMARY      ADMITTING DIAGNOSES:   1. Loose left total knee arthroplasty.   2. Hypertension.   3. Hyperlipidemia.   4. Hiatal hernia.   5. Hemorrhoids.   6. Diverticulosis.   7. History of connective tissue disorder/lipodermatosclerosis.   8. History of renal calculi.   9. Noninsulin-dependent diabetes mellitus.   10.Osteopenia.   11.Postmenopausal.   12.History of childhood illnesses, measles, mumps.   13.Chronic low back pain.   14.History of renal insufficiency.   15.History of anemia.      DISCHARGE DIAGNOSES:   1. Failed left total knee arthroplasty, status post revision left       total knee arthroplasty.   2. Postoperative acute blood loss anemia.   3. Status post transfusion, without sequelae.   4. Postoperative acute renal failure, improved.   5. Hypertension.   6. Hyperlipidemia.   7. Hiatal hernia.   8. Hemorrhoids.   9. Diverticulosis.   10.History of connective tissue disorder/lipodermatosclerosis.   11.History of renal calculi.   12.Noninsulin-dependent diabetes mellitus.   13.Osteopenia.   14.Postmenopausal.   15.History of childhood illnesses, measles, mumps.   16.Chronic low back pain.   17.History of renal insufficiency.   18.History of anemia.   19.Postoperative hyponatremia, improved.      PROCEDURE:  Left total knee arthroplasty revision.  Surgeon:  Dr.   Wynelle Link.  Assistant:  Alexzandrew L. Perkins, P.A.C.  Anesthesia was   general.  Tourniquet time was 64 minutes, down for 10 minutes, then up   an additional 17 minutes.      CONSULTS:  None.      BRIEF HISTORY:  Ms. Pember is  a 70 year old female with a loose unstable   left total knee arthroplasty.  Tibia was grossly loose and collapsed   into varus.  She has intractable pain and now presents for a total knee   arthroplasty revision.      LABORATORY DATA:  Preop CBC showed a hemoglobin of 10.9, hematocrit of   32.5, white cell count 6.7, platelets 241.  PT/INR 13.3 and 1, with PTT   of 29.  Chem panel on admission:  Elevated BUN of 43, elevated   creatinine of 1.29, known history of mild renal insufficiency.   Remaining chem panel within normal limit.  Preop UA negative.  Serial   CBCs were followed.  Hemoglobin dropped down to 8.8, given 2 units of   blood, back up to 9.5, last noted H and H was 8.7 at 25.5.  Serial   protimes followed per Coumadin protocol.  Last noted PT/INR 32.5 and   2.9.  Serial BMETS were followed.  Sodium dropped from 140 to 132, back   up to 136.  BUN went from 43 came down to 41, back up to 47.  Creatinine   went from 1.29 to 2.03, got as high as 2.2, came back down to 2.01,   dropped back down to 1.5.      HOSPITAL COURSE:  The patient was admitted to [facility] and   taken to the O.R. and underwent the above-stated procedure without   complication.  The patient tolerated the procedure well.  Later   transferred to the recovery room and then the orthopedic floor.  Started   on PCA analgesics.  Given 24 hours postop IV antibiotics.  The patient   had low urinary output on day #1.  She had a history of renal   insufficiency but then went into some acute renal failure   postoperatively, with a doubling of her creatinine.  Given some   supplemental fluids, and monitored the urine output.  Her hemoglobin had   dropped also down to 8.8, felt to be significant, as she was low coming   in, so we gave her 2 units of blood.  She tolerated the blood well.  By   day #2, hemoglobin was back up.  Creatinine, which had increased to 2,   was a little higher at 2.1.  Sodium was down due  to a dilutional   component, but her output started to increase.  She was starting to   diurese the extra fluids off, started getting up out of bed, and only   doing short distances about 4 or 5 feet by day #2.  Dressing changed.   Incision looked good.  She was slowly progressing by day #3, however   started to improve with her mobility, but still needs more therapy.  The   creatinine had reached its maximum point of 2.2, and already started   turning around to 1.75.  Sodium was low, but it was starting to   stabilize.  She continued to receive therapy each day.  On day #4, she   was doing better, walking about 120 feet.  Labs continued to improve,   and by day #5, January 14, 2009, her creatinine had returned much nearer   baseline.  The sodium had come up from 133 to 136, creatinine was back   down to 1.5.  Her hemoglobin was little low, but she was noted below   preoperatively.  She was stable at this point.  We did send her iron   supplement.  She is ready to go home.      DISCHARGE PLAN:  Patient discharged home on January 14, 2009.      DISCHARGE DIAGNOSES:  Please see above.      DISCHARGE MEDICATIONS:   1. Percocet.   2. Robaxin.   3. Coumadin.   4. Nu-Iron.      DIET:  Heart-healthy, diabetic diet.      ACTIVITY:  She is total knee protocol.  Weightbearing as tolerated.   Home with PT and home health nursing.      DISCHARGE FOLLOWUP:  In 2 weeks.      DISPOSITION:  Home.      CONDITION ON DISCHARGE:  Improving.               Alexzandrew L. Perkins, P.A.C.               Gaynelle Arabian, M.D.  Electronically Signed         ALP/MEDQ  D:  02/14/2009  T:  02/14/2009  Job:  [id number]      cc:   [place] Cage. Laurann Montana, M.D.   Fax: Corning Rockwell [place], M.D.   Fax: [phone]      Rolm Bookbinder, M.D.

## 2010-07-22 NOTE — Letter (Signed)
Summary: [facility] progress report  [facility] progress report   Imported By: Marlon Pel CMA ([place]) 03/27/2009 09:31:54  _____________________________________________________________________  External Attachment:    Type:   Image     Comment:   External Document

## 2010-07-22 NOTE — Assessment & Plan Note (Signed)
Summary: ARM AND SHOULDER PAIN SINCE STARTING ANTIBIOTICS     [place]   History of Present Illness Visit Type: follow up  Primary GI MD: Delfin Edis MD Primary Provider: Kelton Pillar, MD Requesting Provider: n/a Chief Complaint: Arm and shoulder pain since starting antibiotics  History of Present Illness:   This is a 70 year old white female who has a history of constipation and severe left colon diverticulosis which was diagnosed on a colonoscopy in 1998. At that time, her colonoscopy exam was incomplete due to severe narrowing of the sigmoid colon. A subsequent barium enema showed severe tortuosity of the left colon with some narrowing. Patient was then recovering from an attack of diverticulitis. She has no family history of colon cancer. Patient was treated in 1998 for an esphageal stricture and gastroesophageal reflux. Patient was recently hospitalized due to a knee replacement. At that time, she began having abdominal pain. A CT scan was done and showed an apparent small peridiverticular abscess that was 4cmx4cm.  This was not felt amenable to percutaneous drainage by Interventional Radiology but was small enough that it could very likely be adequately treated with antibiotics alone. She was started on Cipro and Flagyl. She has never been hospitalized for diverticulitis previously and has had minimal ongoing symptoms. She was felt to be at increased risk for colectomy due to her morbid obesity and other medical problems.  Patient was later released from the hospital much improved. She comes today for a follow up. A repeat CT scan of the pelvis showed enlargement of the diverticular abscess from 4-6 cm in diameter. There was extraluminal fluid and gas collection in the central sigmoid colon. 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 Patient restarted her Flagyl and Cipro at that time. A final CT scan completed on 03/22/09 showed interval resolution of  the diverticular abscess with a tiny residual contained focal leak. There was no evidence of acute inflammation or residual abscess at that time.  Patient comes today to discuss multiple concerns regarding her diagnosis. Her major complaint is tendinitis in both shoulders. She found out that Cipro may cause tendinitis in the feet as well as in the shoulder.   GI Review of Systems      Denies abdominal pain, acid reflux, belching, bloating, chest pain, dysphagia with liquids, dysphagia with solids, heartburn, loss of appetite, nausea, vomiting, vomiting blood, weight loss, and  weight gain.        Denies anal fissure, black tarry stools, change in bowel habit, constipation, diarrhea, diverticulosis, fecal incontinence, heme positive stool, hemorrhoids, irritable bowel syndrome, jaundice, light color stool, liver problems, rectal bleeding, and  rectal pain.    Current Medications (verified): 1)  Vicodin 5-500 Mg  Tabs (Hydrocodone-Acetaminophen) .... As Needed 2)  Glipizide 10 Mg  Tabs (Glipizide) .... Two Times A Day 3)  Actos 30 Mg  Tabs (Pioglitazone Hcl) .... Once Daily 4)  Metoprolol Succinate 50 Mg  Tb24 (Metoprolol Succinate) .... Two Times A Day 5)  Cardura 4 Mg  Tabs (Doxazosin Mesylate) .... Once Daily 6)  Furosemide 40 Mg  Tabs (Furosemide) .... Once Daily 7)  Trental 400 Mg  Tbcr (Pentoxifylline) .... Once Daily 8)  Baby Aspirin 81 Mg  Chew (Aspirin) .... Once Daily 9)  Protonix 40 Mg  Tbec (Pantoprazole Sodium) .... Once Daily 10)  Neurontin 300 Mg  Caps (Gabapentin) .... Two Times A Day 11)  Zocor 20 Mg  Tabs (Simvastatin) .... Take 1/2  Daily 12)  Trazamine 50 Mg  Misc (Trazodone & Diet Manage Prod) .... Take 1-2 Daily 13)  Cipro 500 Mg Tabs (Ciprofloxacin Hcl) .... One Tablet By Mouth Two Times A Day X 1 Month 14)  Flagyl 250 Mg Tabs (Metronidazole) .... Take 1 Tablet By Mouth Three Times A Day X 1 Month  Allergies (verified): 1)  ! Norvasc  Past History:  Past Medical  History: Reviewed history from 11/21/2007 and no changes required. Current Problems:  DIABETES MELLITUS, HX OF (ICD-V12.2) HYPERTENSION, HX OF (ICD-V12.50) OSTEOARTHRITIS (ICD-715.90) ANEMIA, HX OF (ICD-V12.3) Hx of UNSPECIFIED INTESTINAL OBSTRUCTION (ICD-560.9) HIATAL HERNIA (ICD-553.3) REFLUX ESOPHAGITIS, HX OF (ICD-V12.79) DIVERTICULOSIS OF COLON (ICD-562.10) Hx of ESOPHAGEAL STRICTURE (ICD-530.3) OBESITY (ICD-278.00) VITAMIN B12 DEFICIENCY (ICD-266.2)  Past Surgical History: Reviewed history from 11/21/2007 and no changes required. cholecystectomy knee surgery  Family History: Reviewed history from 02/19/2009 and no changes required. Family History of Diabetes: Mother and brother  No FH of Colon Cancer:  Social History: Reviewed history from 02/19/2009 and no changes required. Patient is a former smoker.  Occupation: Electrical engineer Alcohol Use - yes Daily Caffeine Use Patient does not get regular exercise.   Review of Systems  The patient denies allergy/sinus, anemia, anxiety-new, arthritis/joint pain, back pain, blood in urine, breast changes/lumps, change in vision, confusion, cough, coughing up blood, depression-new, fainting, fatigue, fever, headaches-new, hearing problems, heart murmur, heart rhythm changes, itching, menstrual pain, muscle pains/cramps, night sweats, nosebleeds, pregnancy symptoms, shortness of breath, skin rash, sleeping problems, sore throat, swelling of feet/legs, swollen lymph glands, thirst - excessive , urination - excessive , urination changes/pain, urine leakage, vision changes, and voice change.         Pertinent positive and negative review of systems were noted in the above HPI. All other ROS was otherwise negative.   Vital Signs:  Patient profile:   70 year old female Height:      60 inches Weight:      260 pounds BMI:     50.96 BSA:     2.09 Pulse rate:   62 / minute Pulse rhythm:   regular BP sitting:   132 / 80  (right arm) Cuff  size:   regular  Vitals Entered By: Hope Pigeon CMA (March 27, 2009 2:23 PM)   Impression & Recommendations:  Problem # 1:  ABSCESS OF INTESTINE (ICD-569.5) Patient is status post diverticular abscess. She has had marked improvement and resolution of her abcess on the last CT scan one week ago showing only tiny residual focal collection in the left lower quadrant. She will continue her Cipro for 2 more days. Patient may increase her fiber intake gradually.  Problem # 2:  PELVIC MASS (ICD-789.30) Patient has a 8 cm pelvic mass most likely arising from the uterus representing a degenerated fibroid. Patient will need a gynecological evaluation.  Problem # 3:  Hx of ESOPHAGEAL STRICTURE (ICD-530.3) Patient currently has no symptoms of stricture.  Patient Instructions: 1)  finish the Cipro course. 2)  If tendinitis does not subside, call back. 3)  Increase fiber content in diet. 4)  Discontinue Flagyl at the same time as cipro. 5)  Gynecological evaluation of a pelvic mass. 6)  Office visit 3 months. 7)  Copy sent to : Dr Excell Seltzer, Dr Lady Deutscher

## 2010-07-22 NOTE — Progress Notes (Signed)
Summary: Appt with surgeon sooner than ct scan  Phone Note Call from Patient Call back at Home Phone [phone]   Call For: DR BRODIE Summary of Call: Has an appoinment next week with Dr Excell Seltzer but her CT Scan is not until Oct 1st? Should she reschedule surgeon consult until after CT Scan? Initial call taken by: [facility],  February 21, 2009 11:12 AM  Follow-up for Phone Call        Dr Olevia Perches- Do I need to reschedule patients appt with Dr Excell Seltzer until after her CT scan for next month or do you think he will see her before since he is the surgeon who saw her in the hospital? Follow-up by: Awilda Bill CMA Deborra Medina),  February 21, 2009 11:51 AM  Additional Follow-up for Phone Call Additional follow up Details #1::        please go ahaed and see Dr Excell Seltzer so he can follow her all along, since he will have to decide about the surgery eventually. If he wants to, he may rechedule the CT scan for an earlier date. Additional Follow-up by: Lafayette Dragon MD,  February 21, 2009 1:28 PM    Additional Follow-up for Phone Call Additional follow up Details #2::    Per patient, she "already cancelled the appt" because "there is not going to be much change" and "he can't tell anything without the new ct." I have asked patient to call back and reschedule her appointment with [facility]. I have also asked that she not cancel future appointments until she contacts [place] to find out what she should do. Patient states that she will. Follow-up by: Awilda Bill CMA Deborra Medina),  February 21, 2009 4:27 PM   Appended Document: Appt with surgeon sooner than ct scan reviwed and agree

## 2010-07-22 NOTE — Progress Notes (Signed)
Summary: TRIAGE  ---- Converted from flag ---- ---- 03/25/2009 5:50 PM, Lafayette Dragon MD wrote: I have spoken to the patient, she is feeling well, has appointm with Dr Excell Seltzer in 3 days. I told her to cancel the appointm with me and to see me in 3 months.  ---- 03/25/2009 4:56 PM, Vivia Ewing LPN wrote: CT results reviewed with pt. She wants to know if she still needs to see you this week? Deb ------------------------------  Phone Note Outgoing Call   Call placed by: Vivia Ewing LPN,  March 26, 1609 9:46 AM Call placed to: Patient Summary of Call: Pt. has a c/o--"Terrible aching in my arms and shoulders since I started taking the antibiotics Dr.Brodie orderd for me"  [place]  Initial call taken by: Vivia Ewing LPN,  March 27, 9603 9:47 AM  Follow-up for Phone Call        I just talked to her at length last night, and she did not mention  anything. She better keep the appointment with me in AM, which I told her she could cancel if she is feeling well. She is seeing Dr Excell Seltzer on 03/28/2009. Follow-up by: Lafayette Dragon MD,  March 26, 2009 1:37 PM  Additional Follow-up for Phone Call Additional follow up Details #1::        Pt. will see Dr.Brodie on 03-27-09 at 2pm.  Additional Follow-up by: Vivia Ewing LPN,  March 26, 5408 4:08 PM

## 2010-07-22 NOTE — Letter (Signed)
Summary: [facility]   Imported By: Bubba Hales 09/06/2009 10:53:07  _____________________________________________________________________  External Attachment:    Type:   Image     Comment:   External Document

## 2010-07-22 NOTE — Letter (Signed)
Summary: [facility]   Imported By: Phillis Knack 04/23/2009 15:09:07  _____________________________________________________________________  External Attachment:    Type:   Image     Comment:   External Document

## 2010-07-22 NOTE — Progress Notes (Signed)
Summary: ? re appt  Phone Note Call from Patient Call back at Home Phone [phone]   Caller: Patient Call For: Tricia Gonzales Reason for Call: Talk to Nurse Summary of Call: Patient wants to know if she needs to come for appt on Monday, states that she had her surgery. Initial call taken by: Ronalee Red,  August 22, 2009 3:31 PM  Follow-up for Phone Call        Patient saw Dr Excell Seltzer today and is going to have a segmental colectomy/hysterectomy in the next month.  She has asked Dr Excell Seltzer to forward a note to you about hi plans.  Patient  has an appointment with you on Monday.  She is wondering if it is ok to cancel or do you need to see her for anything.  Per your dec office note it says planned colon for March 2011.  Please advise. Follow-up by: Barb Merino RN, [place],  August 22, 2009 4:03 PM  Additional Follow-up for Phone Call Additional follow up Details #1::        She can cancel, please don't put any  pt  in her spot to give me a"breather". Additional Follow-up by: Lafayette Dragon MD,  August 22, 2009 9:08 PM    Additional Follow-up for Phone Call Additional follow up Details #2::    Patient  notified I have canceled her appt Follow-up by: Barb Merino RN, CGRN,  August 23, 2009 8:18 AM

## 2010-09-04 LAB — GLUCOSE, CAPILLARY
Glucose-Capillary: 103 mg/dL — ABNORMAL HIGH (ref 70–99)
Glucose-Capillary: 106 mg/dL — ABNORMAL HIGH (ref 70–99)
Glucose-Capillary: 107 mg/dL — ABNORMAL HIGH (ref 70–99)
Glucose-Capillary: 114 mg/dL — ABNORMAL HIGH (ref 70–99)
Glucose-Capillary: 121 mg/dL — ABNORMAL HIGH (ref 70–99)
Glucose-Capillary: 155 mg/dL — ABNORMAL HIGH (ref 70–99)
Glucose-Capillary: 169 mg/dL — ABNORMAL HIGH (ref 70–99)
Glucose-Capillary: 175 mg/dL — ABNORMAL HIGH (ref 70–99)
Glucose-Capillary: 233 mg/dL — ABNORMAL HIGH (ref 70–99)
Glucose-Capillary: 63 mg/dL — ABNORMAL LOW (ref 70–99)
Glucose-Capillary: 64 mg/dL — ABNORMAL LOW (ref 70–99)
Glucose-Capillary: 73 mg/dL (ref 70–99)
Glucose-Capillary: 77 mg/dL (ref 70–99)
Glucose-Capillary: 78 mg/dL (ref 70–99)
Glucose-Capillary: 81 mg/dL (ref 70–99)
Glucose-Capillary: 85 mg/dL (ref 70–99)
Glucose-Capillary: 88 mg/dL (ref 70–99)
Glucose-Capillary: 97 mg/dL (ref 70–99)
Glucose-Capillary: 99 mg/dL (ref 70–99)

## 2010-09-04 LAB — URINALYSIS, ROUTINE W REFLEX MICROSCOPIC
Bilirubin Urine: NEGATIVE
Glucose, UA: NEGATIVE mg/dL
Hgb urine dipstick: NEGATIVE
Ketones, ur: NEGATIVE mg/dL
Nitrite: NEGATIVE
Protein, ur: NEGATIVE mg/dL
Specific Gravity, Urine: 1.01 (ref 1.005–1.030)
Urobilinogen, UA: 0.2 mg/dL (ref 0.0–1.0)
pH: 5 (ref 5.0–8.0)

## 2010-09-04 LAB — COMPREHENSIVE METABOLIC PANEL
ALT: 9 U/L (ref 0–35)
AST: 15 U/L (ref 0–37)
Albumin: 3.9 g/dL (ref 3.5–5.2)
Alkaline Phosphatase: 85 U/L (ref 39–117)
BUN: 37 mg/dL — ABNORMAL HIGH (ref 6–23)
CO2: 31 mEq/L (ref 19–32)
Calcium: 9.5 mg/dL (ref 8.4–10.5)
Chloride: 101 mEq/L (ref 96–112)
Creatinine, Ser: 1.22 mg/dL — ABNORMAL HIGH (ref 0.4–1.2)
GFR calc Af Amer: 53 mL/min — ABNORMAL LOW (ref >60)
GFR calc non Af Amer: 44 mL/min — ABNORMAL LOW (ref >60)
Glucose, Bld: 107 mg/dL — ABNORMAL HIGH (ref 70–99)
Potassium: 4.6 mEq/L (ref 3.5–5.1)
Sodium: 140 mEq/L (ref 135–145)
Total Bilirubin: 0.4 mg/dL (ref 0.3–1.2)
Total Protein: 7.4 g/dL (ref 6.0–8.3)

## 2010-09-04 LAB — DIFFERENTIAL
Basophils Absolute: 0 10*3/uL (ref 0.0–0.1)
Basophils Relative: 0 % (ref 0–1)
Eosinophils Absolute: 0 10*3/uL (ref 0.0–0.7)
Eosinophils Relative: 1 % (ref 0–5)
Lymphocytes Relative: 15 % (ref 12–46)
Lymphs Abs: 0.8 10*3/uL (ref 0.7–4.0)
Monocytes Absolute: 0.3 10*3/uL (ref 0.1–1.0)
Monocytes Relative: 6 % (ref 3–12)
Neutro Abs: 4.3 10*3/uL (ref 1.7–7.7)
Neutrophils Relative %: 78 % — ABNORMAL HIGH (ref 43–77)

## 2010-09-04 LAB — CBC
HCT: 31.8 % — ABNORMAL LOW (ref 36.0–46.0)
Hemoglobin: 10.8 g/dL — ABNORMAL LOW (ref 12.0–15.0)
MCH: 31.3 pg (ref 26.0–34.0)
MCHC: 33.9 g/dL (ref 30.0–36.0)
MCV: 92.3 fL (ref 78.0–100.0)
Platelets: 223 10*3/uL (ref 150–400)
RBC: 3.45 MIL/uL — ABNORMAL LOW (ref 3.87–5.11)
RDW: 14.1 % (ref 11.5–15.5)
WBC: 5.5 10*3/uL (ref 4.0–10.5)

## 2010-09-04 LAB — SURGICAL PCR SCREEN
MRSA, PCR: NEGATIVE
Staphylococcus aureus: NEGATIVE

## 2010-09-04 LAB — HEMOGLOBIN: Hemoglobin: 8.9 g/dL — ABNORMAL LOW (ref 12.0–15.0)

## 2010-09-09 LAB — BASIC METABOLIC PANEL
BUN: 16 mg/dL (ref 6–23)
BUN: 15 mg/dL (ref 6–23)
BUN: 19 mg/dL (ref 6–23)
BUN: 24 mg/dL — ABNORMAL HIGH (ref 6–23)
BUN: 25 mg/dL — ABNORMAL HIGH (ref 6–23)
BUN: 28 mg/dL — ABNORMAL HIGH (ref 6–23)
BUN: 28 mg/dL — ABNORMAL HIGH (ref 6–23)
CO2: 32 mEq/L (ref 19–32)
CO2: 24 mEq/L (ref 19–32)
CO2: 26 mEq/L (ref 19–32)
CO2: 26 mEq/L (ref 19–32)
CO2: 28 mEq/L (ref 19–32)
CO2: 29 mEq/L (ref 19–32)
CO2: 31 mEq/L (ref 19–32)
Calcium: 8.4 mg/dL (ref 8.4–10.5)
Calcium: 7.4 mg/dL — ABNORMAL LOW (ref 8.4–10.5)
Calcium: 7.6 mg/dL — ABNORMAL LOW (ref 8.4–10.5)
Calcium: 7.7 mg/dL — ABNORMAL LOW (ref 8.4–10.5)
Calcium: 7.9 mg/dL — ABNORMAL LOW (ref 8.4–10.5)
Calcium: 8 mg/dL — ABNORMAL LOW (ref 8.4–10.5)
Calcium: 8.4 mg/dL (ref 8.4–10.5)
Chloride: 101 mEq/L (ref 96–112)
Chloride: 106 mEq/L (ref 96–112)
Chloride: 107 mEq/L (ref 96–112)
Chloride: 110 mEq/L (ref 96–112)
Chloride: 111 mEq/L (ref 96–112)
Chloride: 112 mEq/L (ref 96–112)
Chloride: 113 mEq/L — ABNORMAL HIGH (ref 96–112)
Creatinine, Ser: 1.01 mg/dL (ref 0.4–1.2)
Creatinine, Ser: 0.98 mg/dL (ref 0.4–1.2)
Creatinine, Ser: 1.06 mg/dL (ref 0.4–1.2)
Creatinine, Ser: 1.14 mg/dL (ref 0.4–1.2)
Creatinine, Ser: 1.21 mg/dL — ABNORMAL HIGH (ref 0.4–1.2)
Creatinine, Ser: 1.55 mg/dL — ABNORMAL HIGH (ref 0.4–1.2)
Creatinine, Ser: 1.56 mg/dL — ABNORMAL HIGH (ref 0.4–1.2)
GFR calc Af Amer: >60 mL/min (ref >60)
GFR calc Af Amer: 40 mL/min — ABNORMAL LOW (ref >60)
GFR calc Af Amer: 40 mL/min — ABNORMAL LOW (ref >60)
GFR calc Af Amer: 54 mL/min — ABNORMAL LOW (ref >60)
GFR calc Af Amer: 57 mL/min — ABNORMAL LOW (ref >60)
GFR calc Af Amer: >60 mL/min (ref >60)
GFR calc Af Amer: >60 mL/min (ref >60)
GFR calc non Af Amer: 55 mL/min — ABNORMAL LOW (ref >60)
GFR calc non Af Amer: 33 mL/min — ABNORMAL LOW (ref >60)
GFR calc non Af Amer: 33 mL/min — ABNORMAL LOW (ref >60)
GFR calc non Af Amer: 44 mL/min — ABNORMAL LOW (ref >60)
GFR calc non Af Amer: 47 mL/min — ABNORMAL LOW (ref >60)
GFR calc non Af Amer: 52 mL/min — ABNORMAL LOW (ref >60)
GFR calc non Af Amer: 56 mL/min — ABNORMAL LOW (ref >60)
Glucose, Bld: 163 mg/dL — ABNORMAL HIGH (ref 70–99)
Glucose, Bld: 121 mg/dL — ABNORMAL HIGH (ref 70–99)
Glucose, Bld: 132 mg/dL — ABNORMAL HIGH (ref 70–99)
Glucose, Bld: 142 mg/dL — ABNORMAL HIGH (ref 70–99)
Glucose, Bld: 152 mg/dL — ABNORMAL HIGH (ref 70–99)
Glucose, Bld: 244 mg/dL — ABNORMAL HIGH (ref 70–99)
Glucose, Bld: 92 mg/dL (ref 70–99)
Potassium: 4.1 mEq/L (ref 3.5–5.1)
Potassium: 3.9 mEq/L (ref 3.5–5.1)
Potassium: 4.4 mEq/L (ref 3.5–5.1)
Potassium: 4.6 mEq/L (ref 3.5–5.1)
Potassium: 4.7 mEq/L (ref 3.5–5.1)
Potassium: 4.9 mEq/L (ref 3.5–5.1)
Potassium: 5 mEq/L (ref 3.5–5.1)
Sodium: 138 mEq/L (ref 135–145)
Sodium: 138 mEq/L (ref 135–145)
Sodium: 140 mEq/L (ref 135–145)
Sodium: 141 mEq/L (ref 135–145)
Sodium: 143 mEq/L (ref 135–145)
Sodium: 143 mEq/L (ref 135–145)
Sodium: 144 mEq/L (ref 135–145)

## 2010-09-09 LAB — CBC
HCT: 23.8 % — ABNORMAL LOW (ref 36.0–46.0)
HCT: 25.7 % — ABNORMAL LOW (ref 36.0–46.0)
HCT: 25.7 % — ABNORMAL LOW (ref 36.0–46.0)
HCT: 25.9 % — ABNORMAL LOW (ref 36.0–46.0)
HCT: 26.7 % — ABNORMAL LOW (ref 36.0–46.0)
HCT: 27.4 % — ABNORMAL LOW (ref 36.0–46.0)
HCT: 27.7 % — ABNORMAL LOW (ref 36.0–46.0)
HCT: 29.4 % — ABNORMAL LOW (ref 36.0–46.0)
Hemoglobin: 7.8 g/dL — ABNORMAL LOW (ref 12.0–15.0)
Hemoglobin: 8.4 g/dL — ABNORMAL LOW (ref 12.0–15.0)
Hemoglobin: 8.4 g/dL — ABNORMAL LOW (ref 12.0–15.0)
Hemoglobin: 8.5 g/dL — ABNORMAL LOW (ref 12.0–15.0)
Hemoglobin: 8.7 g/dL — ABNORMAL LOW (ref 12.0–15.0)
Hemoglobin: 9 g/dL — ABNORMAL LOW (ref 12.0–15.0)
Hemoglobin: 9.1 g/dL — ABNORMAL LOW (ref 12.0–15.0)
Hemoglobin: 9.8 g/dL — ABNORMAL LOW (ref 12.0–15.0)
MCHC: 32.7 g/dL (ref 30.0–36.0)
MCHC: 32.8 g/dL (ref 30.0–36.0)
MCHC: 32.6 g/dL (ref 30.0–36.0)
MCHC: 32.7 g/dL (ref 30.0–36.0)
MCHC: 32.8 g/dL (ref 30.0–36.0)
MCHC: 32.9 g/dL (ref 30.0–36.0)
MCHC: 33 g/dL (ref 30.0–36.0)
MCHC: 33.2 g/dL (ref 30.0–36.0)
MCV: 88.7 fL (ref 78.0–100.0)
MCV: 90.2 fL (ref 78.0–100.0)
MCV: 88.7 fL (ref 78.0–100.0)
MCV: 90.4 fL (ref 78.0–100.0)
MCV: 91.3 fL (ref 78.0–100.0)
MCV: 91.7 fL (ref 78.0–100.0)
MCV: 91.8 fL (ref 78.0–100.0)
MCV: 92 fL (ref 78.0–100.0)
Platelets: 419 10*3/uL — ABNORMAL HIGH (ref 150–400)
Platelets: 429 10*3/uL — ABNORMAL HIGH (ref 150–400)
Platelets: 166 10*3/uL (ref 150–400)
Platelets: 183 10*3/uL (ref 150–400)
Platelets: 189 10*3/uL (ref 150–400)
Platelets: 190 10*3/uL (ref 150–400)
Platelets: 213 10*3/uL (ref 150–400)
Platelets: 235 10*3/uL (ref 150–400)
RBC: 2.69 MIL/uL — ABNORMAL LOW (ref 3.87–5.11)
RBC: 2.85 MIL/uL — ABNORMAL LOW (ref 3.87–5.11)
RBC: 2.8 MIL/uL — ABNORMAL LOW (ref 3.87–5.11)
RBC: 2.83 MIL/uL — ABNORMAL LOW (ref 3.87–5.11)
RBC: 2.9 MIL/uL — ABNORMAL LOW (ref 3.87–5.11)
RBC: 3.02 MIL/uL — ABNORMAL LOW (ref 3.87–5.11)
RBC: 3.03 MIL/uL — ABNORMAL LOW (ref 3.87–5.11)
RBC: 3.31 MIL/uL — ABNORMAL LOW (ref 3.87–5.11)
RDW: 17.5 % — ABNORMAL HIGH (ref 11.5–15.5)
RDW: 17.5 % — ABNORMAL HIGH (ref 11.5–15.5)
RDW: 16.6 % — ABNORMAL HIGH (ref 11.5–15.5)
RDW: 17 % — ABNORMAL HIGH (ref 11.5–15.5)
RDW: 17 % — ABNORMAL HIGH (ref 11.5–15.5)
RDW: 17.1 % — ABNORMAL HIGH (ref 11.5–15.5)
RDW: 17.1 % — ABNORMAL HIGH (ref 11.5–15.5)
RDW: 17.2 % — ABNORMAL HIGH (ref 11.5–15.5)
WBC: 8.3 10*3/uL (ref 4.0–10.5)
WBC: 8.8 10*3/uL (ref 4.0–10.5)
WBC: 10.6 10*3/uL — ABNORMAL HIGH (ref 4.0–10.5)
WBC: 11.1 10*3/uL — ABNORMAL HIGH (ref 4.0–10.5)
WBC: 11.6 10*3/uL — ABNORMAL HIGH (ref 4.0–10.5)
WBC: 13.8 10*3/uL — ABNORMAL HIGH (ref 4.0–10.5)
WBC: 8.6 10*3/uL (ref 4.0–10.5)
WBC: 9.9 10*3/uL (ref 4.0–10.5)

## 2010-09-09 LAB — GLUCOSE, CAPILLARY
Glucose-Capillary: 130 mg/dL — ABNORMAL HIGH (ref 70–99)
Glucose-Capillary: 132 mg/dL — ABNORMAL HIGH (ref 70–99)
Glucose-Capillary: 135 mg/dL — ABNORMAL HIGH (ref 70–99)
Glucose-Capillary: 150 mg/dL — ABNORMAL HIGH (ref 70–99)
Glucose-Capillary: 156 mg/dL — ABNORMAL HIGH (ref 70–99)
Glucose-Capillary: 158 mg/dL — ABNORMAL HIGH (ref 70–99)
Glucose-Capillary: 159 mg/dL — ABNORMAL HIGH (ref 70–99)
Glucose-Capillary: 161 mg/dL — ABNORMAL HIGH (ref 70–99)
Glucose-Capillary: 169 mg/dL — ABNORMAL HIGH (ref 70–99)
Glucose-Capillary: 170 mg/dL — ABNORMAL HIGH (ref 70–99)
Glucose-Capillary: 202 mg/dL — ABNORMAL HIGH (ref 70–99)
Glucose-Capillary: 215 mg/dL — ABNORMAL HIGH (ref 70–99)
Glucose-Capillary: 243 mg/dL — ABNORMAL HIGH (ref 70–99)
Glucose-Capillary: 243 mg/dL — ABNORMAL HIGH (ref 70–99)
Glucose-Capillary: 117 mg/dL — ABNORMAL HIGH (ref 70–99)
Glucose-Capillary: 181 mg/dL — ABNORMAL HIGH (ref 70–99)
Glucose-Capillary: 101 mg/dL — ABNORMAL HIGH (ref 70–99)
Glucose-Capillary: 109 mg/dL — ABNORMAL HIGH (ref 70–99)
Glucose-Capillary: 109 mg/dL — ABNORMAL HIGH (ref 70–99)
Glucose-Capillary: 109 mg/dL — ABNORMAL HIGH (ref 70–99)
Glucose-Capillary: 109 mg/dL — ABNORMAL HIGH (ref 70–99)
Glucose-Capillary: 114 mg/dL — ABNORMAL HIGH (ref 70–99)
Glucose-Capillary: 114 mg/dL — ABNORMAL HIGH (ref 70–99)
Glucose-Capillary: 117 mg/dL — ABNORMAL HIGH (ref 70–99)
Glucose-Capillary: 119 mg/dL — ABNORMAL HIGH (ref 70–99)
Glucose-Capillary: 120 mg/dL — ABNORMAL HIGH (ref 70–99)
Glucose-Capillary: 120 mg/dL — ABNORMAL HIGH (ref 70–99)
Glucose-Capillary: 121 mg/dL — ABNORMAL HIGH (ref 70–99)
Glucose-Capillary: 122 mg/dL — ABNORMAL HIGH (ref 70–99)
Glucose-Capillary: 123 mg/dL — ABNORMAL HIGH (ref 70–99)
Glucose-Capillary: 123 mg/dL — ABNORMAL HIGH (ref 70–99)
Glucose-Capillary: 123 mg/dL — ABNORMAL HIGH (ref 70–99)
Glucose-Capillary: 125 mg/dL — ABNORMAL HIGH (ref 70–99)
Glucose-Capillary: 126 mg/dL — ABNORMAL HIGH (ref 70–99)
Glucose-Capillary: 127 mg/dL — ABNORMAL HIGH (ref 70–99)
Glucose-Capillary: 128 mg/dL — ABNORMAL HIGH (ref 70–99)
Glucose-Capillary: 129 mg/dL — ABNORMAL HIGH (ref 70–99)
Glucose-Capillary: 130 mg/dL — ABNORMAL HIGH (ref 70–99)
Glucose-Capillary: 130 mg/dL — ABNORMAL HIGH (ref 70–99)
Glucose-Capillary: 133 mg/dL — ABNORMAL HIGH (ref 70–99)
Glucose-Capillary: 137 mg/dL — ABNORMAL HIGH (ref 70–99)
Glucose-Capillary: 138 mg/dL — ABNORMAL HIGH (ref 70–99)
Glucose-Capillary: 138 mg/dL — ABNORMAL HIGH (ref 70–99)
Glucose-Capillary: 140 mg/dL — ABNORMAL HIGH (ref 70–99)
Glucose-Capillary: 141 mg/dL — ABNORMAL HIGH (ref 70–99)
Glucose-Capillary: 141 mg/dL — ABNORMAL HIGH (ref 70–99)
Glucose-Capillary: 142 mg/dL — ABNORMAL HIGH (ref 70–99)
Glucose-Capillary: 143 mg/dL — ABNORMAL HIGH (ref 70–99)
Glucose-Capillary: 143 mg/dL — ABNORMAL HIGH (ref 70–99)
Glucose-Capillary: 145 mg/dL — ABNORMAL HIGH (ref 70–99)
Glucose-Capillary: 147 mg/dL — ABNORMAL HIGH (ref 70–99)
Glucose-Capillary: 147 mg/dL — ABNORMAL HIGH (ref 70–99)
Glucose-Capillary: 150 mg/dL — ABNORMAL HIGH (ref 70–99)
Glucose-Capillary: 150 mg/dL — ABNORMAL HIGH (ref 70–99)
Glucose-Capillary: 156 mg/dL — ABNORMAL HIGH (ref 70–99)
Glucose-Capillary: 156 mg/dL — ABNORMAL HIGH (ref 70–99)
Glucose-Capillary: 159 mg/dL — ABNORMAL HIGH (ref 70–99)
Glucose-Capillary: 160 mg/dL — ABNORMAL HIGH (ref 70–99)
Glucose-Capillary: 162 mg/dL — ABNORMAL HIGH (ref 70–99)
Glucose-Capillary: 168 mg/dL — ABNORMAL HIGH (ref 70–99)
Glucose-Capillary: 169 mg/dL — ABNORMAL HIGH (ref 70–99)
Glucose-Capillary: 176 mg/dL — ABNORMAL HIGH (ref 70–99)
Glucose-Capillary: 179 mg/dL — ABNORMAL HIGH (ref 70–99)
Glucose-Capillary: 182 mg/dL — ABNORMAL HIGH (ref 70–99)
Glucose-Capillary: 186 mg/dL — ABNORMAL HIGH (ref 70–99)
Glucose-Capillary: 187 mg/dL — ABNORMAL HIGH (ref 70–99)
Glucose-Capillary: 200 mg/dL — ABNORMAL HIGH (ref 70–99)
Glucose-Capillary: 203 mg/dL — ABNORMAL HIGH (ref 70–99)
Glucose-Capillary: 257 mg/dL — ABNORMAL HIGH (ref 70–99)
Glucose-Capillary: 82 mg/dL (ref 70–99)
Glucose-Capillary: 86 mg/dL (ref 70–99)
Glucose-Capillary: 97 mg/dL (ref 70–99)
Glucose-Capillary: 98 mg/dL (ref 70–99)
Glucose-Capillary: 99 mg/dL (ref 70–99)

## 2010-09-09 LAB — WOUND CULTURE

## 2010-09-09 LAB — CROSSMATCH
ABO/RH(D): A POS
ABO/RH(D): A POS
Antibody Screen: NEGATIVE
Antibody Screen: NEGATIVE

## 2010-09-09 LAB — POCT I-STAT 4, (NA,K, GLUC, HGB,HCT)
Glucose, Bld: 221 mg/dL — ABNORMAL HIGH (ref 70–99)
HCT: 27 % — ABNORMAL LOW (ref 36.0–46.0)
Hemoglobin: 9.2 g/dL — ABNORMAL LOW (ref 12.0–15.0)
Potassium: 4.1 mEq/L (ref 3.5–5.1)
Sodium: 137 mEq/L (ref 135–145)

## 2010-09-09 LAB — DIFFERENTIAL
Basophils Absolute: 0 10*3/uL (ref 0.0–0.1)
Basophils Relative: 0 % (ref 0–1)
Eosinophils Absolute: 0.2 10*3/uL (ref 0.0–0.7)
Eosinophils Relative: 2 % (ref 0–5)
Lymphocytes Relative: 12 % (ref 12–46)
Lymphs Abs: 1.1 10*3/uL (ref 0.7–4.0)
Monocytes Absolute: 0.8 10*3/uL (ref 0.1–1.0)
Monocytes Relative: 9 % (ref 3–12)
Neutro Abs: 6.8 10*3/uL (ref 1.7–7.7)
Neutrophils Relative %: 77 % (ref 43–77)

## 2010-09-09 LAB — URINE CULTURE
Colony Count: >=100000
Special Requests: NEGATIVE

## 2010-09-09 LAB — COMPREHENSIVE METABOLIC PANEL
ALT: 10 U/L (ref 0–35)
AST: 10 U/L (ref 0–37)
Albumin: 2 g/dL — ABNORMAL LOW (ref 3.5–5.2)
Alkaline Phosphatase: 71 U/L (ref 39–117)
BUN: 12 mg/dL (ref 6–23)
CO2: 32 mEq/L (ref 19–32)
Calcium: 7.8 mg/dL — ABNORMAL LOW (ref 8.4–10.5)
Chloride: 101 mEq/L (ref 96–112)
Creatinine, Ser: 1.11 mg/dL (ref 0.4–1.2)
GFR calc Af Amer: 59 mL/min — ABNORMAL LOW (ref >60)
GFR calc non Af Amer: 49 mL/min — ABNORMAL LOW (ref >60)
Glucose, Bld: 137 mg/dL — ABNORMAL HIGH (ref 70–99)
Potassium: 3.4 mEq/L — ABNORMAL LOW (ref 3.5–5.1)
Sodium: 139 mEq/L (ref 135–145)
Total Bilirubin: 0.4 mg/dL (ref 0.3–1.2)
Total Protein: 5 g/dL — ABNORMAL LOW (ref 6.0–8.3)

## 2010-09-09 LAB — CREATININE, FLUID (PLEURAL, PERITONEAL, JP DRAINAGE): Creat, Fluid: 1.1 mg/dL

## 2010-09-09 LAB — PREALBUMIN: Prealbumin: 9.7 mg/dL — ABNORMAL LOW (ref 18.0–45.0)

## 2010-09-09 LAB — HEMOGLOBIN AND HEMATOCRIT, BLOOD
HCT: 26 % — ABNORMAL LOW (ref 36.0–46.0)
Hemoglobin: 8.6 g/dL — ABNORMAL LOW (ref 12.0–15.0)

## 2010-09-09 LAB — MRSA PCR SCREENING

## 2010-09-09 LAB — PREPARE RBC (CROSSMATCH)

## 2010-09-09 LAB — PROTIME-INR
INR: 1.05 (ref 0.00–1.49)
Prothrombin Time: 13.6 seconds (ref 11.6–15.2)

## 2010-09-09 LAB — APTT: aPTT: 28 seconds (ref 24–37)

## 2010-09-09 LAB — ABO/RH: ABO/RH(D): A POS

## 2010-09-10 LAB — URINALYSIS, ROUTINE W REFLEX MICROSCOPIC
Bilirubin Urine: NEGATIVE
Glucose, UA: NEGATIVE mg/dL
Ketones, ur: NEGATIVE mg/dL
Nitrite: NEGATIVE
Protein, ur: NEGATIVE mg/dL
Specific Gravity, Urine: 1.014 (ref 1.005–1.030)
Urobilinogen, UA: 0.2 mg/dL (ref 0.0–1.0)
pH: 5.5 (ref 5.0–8.0)

## 2010-09-10 LAB — CBC
HCT: 30.7 % — ABNORMAL LOW (ref 36.0–46.0)
Hemoglobin: 10.2 g/dL — ABNORMAL LOW (ref 12.0–15.0)
MCHC: 33.3 g/dL (ref 30.0–36.0)
MCV: 88.7 fL (ref 78.0–100.0)
Platelets: 270 10*3/uL (ref 150–400)
RBC: 3.46 MIL/uL — ABNORMAL LOW (ref 3.87–5.11)
RDW: 16.5 % — ABNORMAL HIGH (ref 11.5–15.5)
WBC: 4.6 10*3/uL (ref 4.0–10.5)

## 2010-09-10 LAB — COMPREHENSIVE METABOLIC PANEL
ALT: 11 U/L (ref 0–35)
AST: 16 U/L (ref 0–37)
Albumin: 3.5 g/dL (ref 3.5–5.2)
Alkaline Phosphatase: 72 U/L (ref 39–117)
BUN: 33 mg/dL — ABNORMAL HIGH (ref 6–23)
CO2: 34 mEq/L — ABNORMAL HIGH (ref 19–32)
Calcium: 9.4 mg/dL (ref 8.4–10.5)
Chloride: 101 mEq/L (ref 96–112)
Creatinine, Ser: 1.28 mg/dL — ABNORMAL HIGH (ref 0.4–1.2)
GFR calc Af Amer: 50 mL/min — ABNORMAL LOW (ref >60)
GFR calc non Af Amer: 41 mL/min — ABNORMAL LOW (ref >60)
Glucose, Bld: 183 mg/dL — ABNORMAL HIGH (ref 70–99)
Potassium: 4.4 mEq/L (ref 3.5–5.1)
Sodium: 139 mEq/L (ref 135–145)
Total Bilirubin: 0.5 mg/dL (ref 0.3–1.2)
Total Protein: 7.4 g/dL (ref 6.0–8.3)

## 2010-09-10 LAB — TYPE AND SCREEN
ABO/RH(D): A POS
Antibody Screen: NEGATIVE

## 2010-09-10 LAB — DIFFERENTIAL
Basophils Absolute: 0 10*3/uL (ref 0.0–0.1)
Basophils Relative: 0 % (ref 0–1)
Eosinophils Absolute: 0.1 10*3/uL (ref 0.0–0.7)
Eosinophils Relative: 3 % (ref 0–5)
Lymphocytes Relative: 22 % (ref 12–46)
Lymphs Abs: 1 10*3/uL (ref 0.7–4.0)
Monocytes Absolute: 0.3 10*3/uL (ref 0.1–1.0)
Monocytes Relative: 6 % (ref 3–12)
Neutro Abs: 3.2 10*3/uL (ref 1.7–7.7)
Neutrophils Relative %: 68 % (ref 43–77)

## 2010-09-10 LAB — URINE MICROSCOPIC-ADD ON

## 2010-09-27 LAB — GLUCOSE, CAPILLARY
Glucose-Capillary: 113 mg/dL — ABNORMAL HIGH (ref 70–99)
Glucose-Capillary: 123 mg/dL — ABNORMAL HIGH (ref 70–99)
Glucose-Capillary: 123 mg/dL — ABNORMAL HIGH (ref 70–99)
Glucose-Capillary: 127 mg/dL — ABNORMAL HIGH (ref 70–99)
Glucose-Capillary: 128 mg/dL — ABNORMAL HIGH (ref 70–99)
Glucose-Capillary: 133 mg/dL — ABNORMAL HIGH (ref 70–99)
Glucose-Capillary: 134 mg/dL — ABNORMAL HIGH (ref 70–99)
Glucose-Capillary: 134 mg/dL — ABNORMAL HIGH (ref 70–99)
Glucose-Capillary: 136 mg/dL — ABNORMAL HIGH (ref 70–99)
Glucose-Capillary: 141 mg/dL — ABNORMAL HIGH (ref 70–99)
Glucose-Capillary: 142 mg/dL — ABNORMAL HIGH (ref 70–99)
Glucose-Capillary: 145 mg/dL — ABNORMAL HIGH (ref 70–99)
Glucose-Capillary: 147 mg/dL — ABNORMAL HIGH (ref 70–99)
Glucose-Capillary: 148 mg/dL — ABNORMAL HIGH (ref 70–99)
Glucose-Capillary: 150 mg/dL — ABNORMAL HIGH (ref 70–99)
Glucose-Capillary: 151 mg/dL — ABNORMAL HIGH (ref 70–99)
Glucose-Capillary: 153 mg/dL — ABNORMAL HIGH (ref 70–99)
Glucose-Capillary: 162 mg/dL — ABNORMAL HIGH (ref 70–99)
Glucose-Capillary: 175 mg/dL — ABNORMAL HIGH (ref 70–99)
Glucose-Capillary: 182 mg/dL — ABNORMAL HIGH (ref 70–99)
Glucose-Capillary: 184 mg/dL — ABNORMAL HIGH (ref 70–99)
Glucose-Capillary: 189 mg/dL — ABNORMAL HIGH (ref 70–99)
Glucose-Capillary: 210 mg/dL — ABNORMAL HIGH (ref 70–99)
Glucose-Capillary: 87 mg/dL (ref 70–99)
Glucose-Capillary: 96 mg/dL (ref 70–99)

## 2010-09-27 LAB — LIPASE, BLOOD: Lipase: 16 U/L (ref 11–59)

## 2010-09-27 LAB — URINALYSIS, ROUTINE W REFLEX MICROSCOPIC
Bilirubin Urine: NEGATIVE
Glucose, UA: NEGATIVE mg/dL
Hgb urine dipstick: NEGATIVE
Ketones, ur: NEGATIVE mg/dL
Nitrite: NEGATIVE
Protein, ur: NEGATIVE mg/dL
Specific Gravity, Urine: 1.006 (ref 1.005–1.030)
Urobilinogen, UA: 1 mg/dL (ref 0.0–1.0)
pH: 6.5 (ref 5.0–8.0)

## 2010-09-27 LAB — COMPREHENSIVE METABOLIC PANEL
ALT: 10 U/L (ref 0–35)
AST: 15 U/L (ref 0–37)
Albumin: 2.6 g/dL — ABNORMAL LOW (ref 3.5–5.2)
Alkaline Phosphatase: 112 U/L (ref 39–117)
BUN: 15 mg/dL (ref 6–23)
CO2: 33 mEq/L — ABNORMAL HIGH (ref 19–32)
Calcium: 8.8 mg/dL (ref 8.4–10.5)
Chloride: 96 mEq/L (ref 96–112)
Creatinine, Ser: 1.04 mg/dL (ref 0.4–1.2)
GFR calc Af Amer: >60 mL/min (ref >60)
GFR calc non Af Amer: 53 mL/min — ABNORMAL LOW (ref >60)
Glucose, Bld: 160 mg/dL — ABNORMAL HIGH (ref 70–99)
Potassium: 3.7 mEq/L (ref 3.5–5.1)
Sodium: 136 mEq/L (ref 135–145)
Total Bilirubin: 0.8 mg/dL (ref 0.3–1.2)
Total Protein: 6.6 g/dL (ref 6.0–8.3)

## 2010-09-27 LAB — PROTIME-INR
INR: 1.7 — ABNORMAL HIGH (ref 0.00–1.49)
INR: 1.9 — ABNORMAL HIGH (ref 0.00–1.49)
INR: 2.5 — ABNORMAL HIGH (ref 0.00–1.49)
INR: 3 — ABNORMAL HIGH (ref 0.00–1.49)
INR: 3.1 — ABNORMAL HIGH (ref 0.00–1.49)
INR: 3.7 — ABNORMAL HIGH (ref 0.00–1.49)
Prothrombin Time: 19.7 seconds — ABNORMAL HIGH (ref 11.6–15.2)
Prothrombin Time: 21.8 seconds — ABNORMAL HIGH (ref 11.6–15.2)
Prothrombin Time: 26.5 seconds — ABNORMAL HIGH (ref 11.6–15.2)
Prothrombin Time: 31.1 seconds — ABNORMAL HIGH (ref 11.6–15.2)
Prothrombin Time: 31.3 seconds — ABNORMAL HIGH (ref 11.6–15.2)
Prothrombin Time: 36.1 seconds — ABNORMAL HIGH (ref 11.6–15.2)

## 2010-09-27 LAB — BASIC METABOLIC PANEL
BUN: 10 mg/dL (ref 6–23)
BUN: 11 mg/dL (ref 6–23)
CO2: 30 mEq/L (ref 19–32)
CO2: 30 mEq/L (ref 19–32)
Calcium: 8.2 mg/dL — ABNORMAL LOW (ref 8.4–10.5)
Calcium: 8.2 mg/dL — ABNORMAL LOW (ref 8.4–10.5)
Chloride: 100 mEq/L (ref 96–112)
Chloride: 97 mEq/L (ref 96–112)
Creatinine, Ser: 0.96 mg/dL (ref 0.4–1.2)
Creatinine, Ser: 1.03 mg/dL (ref 0.4–1.2)
GFR calc Af Amer: >60 mL/min (ref >60)
GFR calc Af Amer: >60 mL/min (ref >60)
GFR calc non Af Amer: 53 mL/min — ABNORMAL LOW (ref >60)
GFR calc non Af Amer: 58 mL/min — ABNORMAL LOW (ref >60)
Glucose, Bld: 117 mg/dL — ABNORMAL HIGH (ref 70–99)
Glucose, Bld: 162 mg/dL — ABNORMAL HIGH (ref 70–99)
Potassium: 3.7 mEq/L (ref 3.5–5.1)
Potassium: 3.7 mEq/L (ref 3.5–5.1)
Sodium: 134 mEq/L — ABNORMAL LOW (ref 135–145)
Sodium: 136 mEq/L (ref 135–145)

## 2010-09-27 LAB — IRON AND TIBC
Iron: 23 ug/dL — ABNORMAL LOW (ref 42–135)
Saturation Ratios: 12 % — ABNORMAL LOW (ref 20–55)
TIBC: 186 ug/dL — ABNORMAL LOW (ref 250–470)
UIBC: 163 ug/dL

## 2010-09-27 LAB — CBC
HCT: 25.4 % — ABNORMAL LOW (ref 36.0–46.0)
HCT: 25.8 % — ABNORMAL LOW (ref 36.0–46.0)
HCT: 31.2 % — ABNORMAL LOW (ref 36.0–46.0)
Hemoglobin: 10.1 g/dL — ABNORMAL LOW (ref 12.0–15.0)
Hemoglobin: 8.4 g/dL — ABNORMAL LOW (ref 12.0–15.0)
Hemoglobin: 8.6 g/dL — ABNORMAL LOW (ref 12.0–15.0)
MCHC: 32.2 g/dL (ref 30.0–36.0)
MCHC: 32.7 g/dL (ref 30.0–36.0)
MCHC: 33.7 g/dL (ref 30.0–36.0)
MCV: 89.6 fL (ref 78.0–100.0)
MCV: 89.7 fL (ref 78.0–100.0)
MCV: 90 fL (ref 78.0–100.0)
Platelets: 388 10*3/uL (ref 150–400)
Platelets: 398 10*3/uL (ref 150–400)
Platelets: 436 10*3/uL — ABNORMAL HIGH (ref 150–400)
RBC: 2.83 MIL/uL — ABNORMAL LOW (ref 3.87–5.11)
RBC: 2.88 MIL/uL — ABNORMAL LOW (ref 3.87–5.11)
RBC: 3.47 MIL/uL — ABNORMAL LOW (ref 3.87–5.11)
RDW: 14.9 % (ref 11.5–15.5)
RDW: 15 % (ref 11.5–15.5)
RDW: 15 % (ref 11.5–15.5)
WBC: 10.8 10*3/uL — ABNORMAL HIGH (ref 4.0–10.5)
WBC: 8.6 10*3/uL (ref 4.0–10.5)
WBC: 9.4 10*3/uL (ref 4.0–10.5)

## 2010-09-27 LAB — URINE CULTURE: Colony Count: >=100000

## 2010-09-27 LAB — DIFFERENTIAL
Basophils Absolute: 0 10*3/uL (ref 0.0–0.1)
Basophils Relative: 0 % (ref 0–1)
Eosinophils Absolute: 0 10*3/uL (ref 0.0–0.7)
Eosinophils Relative: 0 % (ref 0–5)
Lymphocytes Relative: 6 % — ABNORMAL LOW (ref 12–46)
Lymphs Abs: 0.6 10*3/uL — ABNORMAL LOW (ref 0.7–4.0)
Monocytes Absolute: 0.6 10*3/uL (ref 0.1–1.0)
Monocytes Relative: 6 % (ref 3–12)
Neutro Abs: 9.5 10*3/uL — ABNORMAL HIGH (ref 1.7–7.7)
Neutrophils Relative %: 89 % — ABNORMAL HIGH (ref 43–77)

## 2010-09-27 LAB — HEMOCCULT GUIAC POC 1CARD (OFFICE)
Fecal Occult Bld: NEGATIVE
Fecal Occult Bld: NEGATIVE

## 2010-09-27 LAB — FERRITIN: Ferritin: 240 ng/mL (ref 10–291)

## 2010-09-27 LAB — CA 125: CA 125: 17 U/mL (ref 0.0–30.2)

## 2010-09-27 LAB — HEMOGLOBIN AND HEMATOCRIT, BLOOD
HCT: 25.9 % — ABNORMAL LOW (ref 36.0–46.0)
Hemoglobin: 8.6 g/dL — ABNORMAL LOW (ref 12.0–15.0)

## 2010-09-27 LAB — APTT: aPTT: 33 seconds (ref 24–37)

## 2010-09-28 LAB — BASIC METABOLIC PANEL
BUN: 38 mg/dL — ABNORMAL HIGH (ref 6–23)
BUN: 41 mg/dL — ABNORMAL HIGH (ref 6–23)
BUN: 41 mg/dL — ABNORMAL HIGH (ref 6–23)
BUN: 42 mg/dL — ABNORMAL HIGH (ref 6–23)
BUN: 43 mg/dL — ABNORMAL HIGH (ref 6–23)
BUN: 47 mg/dL — ABNORMAL HIGH (ref 6–23)
CO2: 28 mEq/L (ref 19–32)
CO2: 28 mEq/L (ref 19–32)
CO2: 28 mEq/L (ref 19–32)
CO2: 28 mEq/L (ref 19–32)
CO2: 29 mEq/L (ref 19–32)
CO2: 30 mEq/L (ref 19–32)
Calcium: 7.6 mg/dL — ABNORMAL LOW (ref 8.4–10.5)
Calcium: 7.8 mg/dL — ABNORMAL LOW (ref 8.4–10.5)
Calcium: 7.9 mg/dL — ABNORMAL LOW (ref 8.4–10.5)
Calcium: 8 mg/dL — ABNORMAL LOW (ref 8.4–10.5)
Calcium: 8.1 mg/dL — ABNORMAL LOW (ref 8.4–10.5)
Calcium: 8.2 mg/dL — ABNORMAL LOW (ref 8.4–10.5)
Chloride: 100 mEq/L (ref 96–112)
Chloride: 101 mEq/L (ref 96–112)
Chloride: 101 mEq/L (ref 96–112)
Chloride: 102 mEq/L (ref 96–112)
Chloride: 103 mEq/L (ref 96–112)
Chloride: 99 mEq/L (ref 96–112)
Creatinine, Ser: 1.52 mg/dL — ABNORMAL HIGH (ref 0.4–1.2)
Creatinine, Ser: 1.75 mg/dL — ABNORMAL HIGH (ref 0.4–1.2)
Creatinine, Ser: 2.01 mg/dL — ABNORMAL HIGH (ref 0.4–1.2)
Creatinine, Ser: 2.03 mg/dL — ABNORMAL HIGH (ref 0.4–1.2)
Creatinine, Ser: 2.13 mg/dL — ABNORMAL HIGH (ref 0.4–1.2)
Creatinine, Ser: 2.22 mg/dL — ABNORMAL HIGH (ref 0.4–1.2)
GFR calc Af Amer: 27 mL/min — ABNORMAL LOW (ref >60)
GFR calc Af Amer: 28 mL/min — ABNORMAL LOW (ref >60)
GFR calc Af Amer: 30 mL/min — ABNORMAL LOW (ref >60)
GFR calc Af Amer: 30 mL/min — ABNORMAL LOW (ref >60)
GFR calc Af Amer: 35 mL/min — ABNORMAL LOW (ref >60)
GFR calc Af Amer: 41 mL/min — ABNORMAL LOW (ref >60)
GFR calc non Af Amer: 22 mL/min — ABNORMAL LOW (ref >60)
GFR calc non Af Amer: 23 mL/min — ABNORMAL LOW (ref >60)
GFR calc non Af Amer: 24 mL/min — ABNORMAL LOW (ref >60)
GFR calc non Af Amer: 25 mL/min — ABNORMAL LOW (ref >60)
GFR calc non Af Amer: 29 mL/min — ABNORMAL LOW (ref >60)
GFR calc non Af Amer: 34 mL/min — ABNORMAL LOW (ref >60)
Glucose, Bld: 118 mg/dL — ABNORMAL HIGH (ref 70–99)
Glucose, Bld: 141 mg/dL — ABNORMAL HIGH (ref 70–99)
Glucose, Bld: 171 mg/dL — ABNORMAL HIGH (ref 70–99)
Glucose, Bld: 183 mg/dL — ABNORMAL HIGH (ref 70–99)
Glucose, Bld: 183 mg/dL — ABNORMAL HIGH (ref 70–99)
Glucose, Bld: 97 mg/dL (ref 70–99)
Potassium: 4.1 mEq/L (ref 3.5–5.1)
Potassium: 4.1 mEq/L (ref 3.5–5.1)
Potassium: 4.7 mEq/L (ref 3.5–5.1)
Potassium: 4.9 mEq/L (ref 3.5–5.1)
Potassium: 5 mEq/L (ref 3.5–5.1)
Potassium: 5.1 mEq/L (ref 3.5–5.1)
Sodium: 132 mEq/L — ABNORMAL LOW (ref 135–145)
Sodium: 133 mEq/L — ABNORMAL LOW (ref 135–145)
Sodium: 134 mEq/L — ABNORMAL LOW (ref 135–145)
Sodium: 134 mEq/L — ABNORMAL LOW (ref 135–145)
Sodium: 136 mEq/L (ref 135–145)
Sodium: 141 mEq/L (ref 135–145)

## 2010-09-28 LAB — GLUCOSE, CAPILLARY
Glucose-Capillary: 101 mg/dL — ABNORMAL HIGH (ref 70–99)
Glucose-Capillary: 107 mg/dL — ABNORMAL HIGH (ref 70–99)
Glucose-Capillary: 115 mg/dL — ABNORMAL HIGH (ref 70–99)
Glucose-Capillary: 116 mg/dL — ABNORMAL HIGH (ref 70–99)
Glucose-Capillary: 120 mg/dL — ABNORMAL HIGH (ref 70–99)
Glucose-Capillary: 123 mg/dL — ABNORMAL HIGH (ref 70–99)
Glucose-Capillary: 124 mg/dL — ABNORMAL HIGH (ref 70–99)
Glucose-Capillary: 127 mg/dL — ABNORMAL HIGH (ref 70–99)
Glucose-Capillary: 129 mg/dL — ABNORMAL HIGH (ref 70–99)
Glucose-Capillary: 133 mg/dL — ABNORMAL HIGH (ref 70–99)
Glucose-Capillary: 133 mg/dL — ABNORMAL HIGH (ref 70–99)
Glucose-Capillary: 151 mg/dL — ABNORMAL HIGH (ref 70–99)
Glucose-Capillary: 152 mg/dL — ABNORMAL HIGH (ref 70–99)
Glucose-Capillary: 155 mg/dL — ABNORMAL HIGH (ref 70–99)
Glucose-Capillary: 157 mg/dL — ABNORMAL HIGH (ref 70–99)
Glucose-Capillary: 161 mg/dL — ABNORMAL HIGH (ref 70–99)
Glucose-Capillary: 171 mg/dL — ABNORMAL HIGH (ref 70–99)
Glucose-Capillary: 172 mg/dL — ABNORMAL HIGH (ref 70–99)
Glucose-Capillary: 173 mg/dL — ABNORMAL HIGH (ref 70–99)
Glucose-Capillary: 189 mg/dL — ABNORMAL HIGH (ref 70–99)
Glucose-Capillary: 201 mg/dL — ABNORMAL HIGH (ref 70–99)

## 2010-09-28 LAB — CBC
HCT: 25.5 % — ABNORMAL LOW (ref 36.0–46.0)
HCT: 25.7 % — ABNORMAL LOW (ref 36.0–46.0)
HCT: 25.8 % — ABNORMAL LOW (ref 36.0–46.0)
HCT: 27.8 % — ABNORMAL LOW (ref 36.0–46.0)
HCT: 32.5 % — ABNORMAL LOW (ref 36.0–46.0)
Hemoglobin: 10.9 g/dL — ABNORMAL LOW (ref 12.0–15.0)
Hemoglobin: 8.7 g/dL — ABNORMAL LOW (ref 12.0–15.0)
Hemoglobin: 8.8 g/dL — ABNORMAL LOW (ref 12.0–15.0)
Hemoglobin: 9 g/dL — ABNORMAL LOW (ref 12.0–15.0)
Hemoglobin: 9.5 g/dL — ABNORMAL LOW (ref 12.0–15.0)
MCHC: 33.5 g/dL (ref 30.0–36.0)
MCHC: 34 g/dL (ref 30.0–36.0)
MCHC: 34.1 g/dL (ref 30.0–36.0)
MCHC: 34.2 g/dL (ref 30.0–36.0)
MCHC: 34.8 g/dL (ref 30.0–36.0)
MCV: 90.6 fL (ref 78.0–100.0)
MCV: 91 fL (ref 78.0–100.0)
MCV: 91 fL (ref 78.0–100.0)
MCV: 91.3 fL (ref 78.0–100.0)
MCV: 91.7 fL (ref 78.0–100.0)
Platelets: 184 10*3/uL (ref 150–400)
Platelets: 186 10*3/uL (ref 150–400)
Platelets: 203 10*3/uL (ref 150–400)
Platelets: 212 10*3/uL (ref 150–400)
Platelets: 241 10*3/uL (ref 150–400)
RBC: 2.79 MIL/uL — ABNORMAL LOW (ref 3.87–5.11)
RBC: 2.8 MIL/uL — ABNORMAL LOW (ref 3.87–5.11)
RBC: 2.85 MIL/uL — ABNORMAL LOW (ref 3.87–5.11)
RBC: 3.06 MIL/uL — ABNORMAL LOW (ref 3.87–5.11)
RBC: 3.58 MIL/uL — ABNORMAL LOW (ref 3.87–5.11)
RDW: 13.8 % (ref 11.5–15.5)
RDW: 13.9 % (ref 11.5–15.5)
RDW: 13.9 % (ref 11.5–15.5)
RDW: 13.9 % (ref 11.5–15.5)
RDW: 14.2 % (ref 11.5–15.5)
WBC: 10.1 10*3/uL (ref 4.0–10.5)
WBC: 6.7 10*3/uL (ref 4.0–10.5)
WBC: 6.7 10*3/uL (ref 4.0–10.5)
WBC: 6.7 10*3/uL (ref 4.0–10.5)
WBC: 8.4 10*3/uL (ref 4.0–10.5)

## 2010-09-28 LAB — URINALYSIS, ROUTINE W REFLEX MICROSCOPIC
Bilirubin Urine: NEGATIVE
Glucose, UA: NEGATIVE mg/dL
Hgb urine dipstick: NEGATIVE
Ketones, ur: NEGATIVE mg/dL
Nitrite: NEGATIVE
Protein, ur: NEGATIVE mg/dL
Specific Gravity, Urine: 1.009 (ref 1.005–1.030)
Urobilinogen, UA: 0.2 mg/dL (ref 0.0–1.0)
pH: 6 (ref 5.0–8.0)

## 2010-09-28 LAB — CROSSMATCH
ABO/RH(D): A POS
Antibody Screen: NEGATIVE

## 2010-09-28 LAB — PROTIME-INR
INR: 1 (ref 0.00–1.49)
INR: 1.2 (ref 0.00–1.49)
INR: 1.6 — ABNORMAL HIGH (ref 0.00–1.49)
INR: 2 — ABNORMAL HIGH (ref 0.00–1.49)
INR: 2.3 — ABNORMAL HIGH (ref 0.00–1.49)
INR: 2.9 — ABNORMAL HIGH (ref 0.00–1.49)
Prothrombin Time: 13.3 seconds (ref 11.6–15.2)
Prothrombin Time: 15.4 seconds — ABNORMAL HIGH (ref 11.6–15.2)
Prothrombin Time: 20.1 seconds — ABNORMAL HIGH (ref 11.6–15.2)
Prothrombin Time: 24.3 seconds — ABNORMAL HIGH (ref 11.6–15.2)
Prothrombin Time: 26.7 seconds — ABNORMAL HIGH (ref 11.6–15.2)
Prothrombin Time: 32.5 seconds — ABNORMAL HIGH (ref 11.6–15.2)

## 2010-09-28 LAB — COMPREHENSIVE METABOLIC PANEL
ALT: 11 U/L (ref 0–35)
AST: 19 U/L (ref 0–37)
Albumin: 3.4 g/dL — ABNORMAL LOW (ref 3.5–5.2)
Alkaline Phosphatase: 61 U/L (ref 39–117)
BUN: 43 mg/dL — ABNORMAL HIGH (ref 6–23)
CO2: 30 mEq/L (ref 19–32)
Calcium: 9.4 mg/dL (ref 8.4–10.5)
Chloride: 102 mEq/L (ref 96–112)
Creatinine, Ser: 1.29 mg/dL — ABNORMAL HIGH (ref 0.4–1.2)
GFR calc Af Amer: 50 mL/min — ABNORMAL LOW (ref >60)
GFR calc non Af Amer: 41 mL/min — ABNORMAL LOW (ref >60)
Glucose, Bld: 188 mg/dL — ABNORMAL HIGH (ref 70–99)
Potassium: 4.2 mEq/L (ref 3.5–5.1)
Sodium: 140 mEq/L (ref 135–145)
Total Bilirubin: 0.6 mg/dL (ref 0.3–1.2)
Total Protein: 7.1 g/dL (ref 6.0–8.3)

## 2010-09-28 LAB — APTT: aPTT: 29 seconds (ref 24–37)

## 2010-11-04 NOTE — Discharge Summary (Signed)
NAME:  Tricia Gonzales, Tricia Gonzales                 ACCOUNT NO.:  [id number]      MEDICAL RECORD NO.:  [id number]          PATIENT TYPE:  INP      LOCATION:  1619                         FACILITY:  [facility]      PHYSICIAN:  Frank Aluisio, M.D.    DATE OF BIRTH:  04/23/1941      DATE OF ADMISSION:  01/09/2009   DATE OF DISCHARGE:  01/14/2009                                  DISCHARGE SUMMARY      ADMITTING DIAGNOSES:   1. Loose left total knee arthroplasty.   2. Hypertension.   3. Hyperlipidemia.   4. Hiatal hernia.   5. Hemorrhoids.   6. Diverticulosis.   7. History of connective tissue disorder/lipodermatosclerosis.   8. History of renal calculi.   9. Noninsulin-dependent diabetes mellitus.   10.Osteopenia.   11.Postmenopausal.   12.History of childhood illnesses, measles, mumps.   13.Chronic low back pain.   14.History of renal insufficiency.   15.History of anemia.      DISCHARGE DIAGNOSES:   1. Failed left total knee arthroplasty, status post revision left       total knee arthroplasty.   2. Postoperative acute blood loss anemia.   3. Status post transfusion, without sequelae.   4. Postoperative acute renal failure, improved.   5. Hypertension.   6. Hyperlipidemia.   7. Hiatal hernia.   8. Hemorrhoids.   9. Diverticulosis.   10.History of connective tissue disorder/lipodermatosclerosis.   11.History of renal calculi.   12.Noninsulin-dependent diabetes mellitus.   13.Osteopenia.   14.Postmenopausal.   15.History of childhood illnesses, measles, mumps.   16.Chronic low back pain.   17.History of renal insufficiency.   18.History of anemia.   19.Postoperative hyponatremia, improved.      PROCEDURE:  Left total knee arthroplasty revision.  Surgeon:  Dr.   Aluisio.  Assistant:  Alexzandrew L. Perkins, P.A.C.  Anesthesia was   general.  Tourniquet time was 64 minutes, down for 10 minutes, then up   an additional 17 minutes.      CONSULTS:  None.      BRIEF HISTORY:  Ms. Delahunt is  a 70-year-old female with a loose unstable   left total knee arthroplasty.  Tibia was grossly loose and collapsed   into varus.  She has intractable pain and now presents for a total knee   arthroplasty revision.      LABORATORY DATA:  Preop CBC showed a hemoglobin of 10.9, hematocrit of   32.5, white cell count 6.7, platelets 241.  PT/INR 13.3 and 1, with PTT   of 29.  Chem panel on admission:  Elevated BUN of 43, elevated   creatinine of 1.29, known history of mild renal insufficiency.   Remaining chem panel within normal limit.  Preop UA negative.  Serial   CBCs were followed.  Hemoglobin dropped down to 8.8, given 2 units of   blood, back up to 9.5, last noted H and H was 8.7 at 25.5.  Serial   protimes followed per Coumadin protocol.  Last noted PT/INR 32.5 and   2.9.    Serial BMETS were followed.  Sodium dropped from 140 to 132, back   up to 136.  BUN went from 43 came down to 41, back up to 47.  Creatinine   went from 1.29 to 2.03, got as high as 2.2, came back down to 2.01,   dropped back down to 1.5.      HOSPITAL COURSE:  The patient was admitted to [facility] and   taken to the O.R. and underwent the above-stated procedure without   complication.  The patient tolerated the procedure well.  Later   transferred to the recovery room and then the orthopedic floor.  Started   on PCA analgesics.  Given 24 hours postop IV antibiotics.  The patient   had low urinary output on day #1.  She had a history of renal   insufficiency but then went into some acute renal failure   postoperatively, with a doubling of her creatinine.  Given some   supplemental fluids, and monitored the urine output.  Her hemoglobin had   dropped also down to 8.8, felt to be significant, as she was low coming   in, so we gave her 2 units of blood.  She tolerated the blood well.  By   day #2, hemoglobin was back up.  Creatinine, which had increased to 2,   was a little higher at 2.1.  Sodium was down due  to a dilutional   component, but her output started to increase.  She was starting to   diurese the extra fluids off, started getting up out of bed, and only   doing short distances about 4 or 5 feet by day #2.  Dressing changed.   Incision looked good.  She was slowly progressing by day #3, however   started to improve with her mobility, but still needs more therapy.  The   creatinine had reached its maximum point of 2.2, and already started   turning around to 1.75.  Sodium was low, but it was starting to   stabilize.  She continued to receive therapy each day.  On day #4, she   was doing better, walking about 120 feet.  Labs continued to improve,   and by day #5, January 14, 2009, her creatinine had returned much nearer   baseline.  The sodium had come up from 133 to 136, creatinine was back   down to 1.5.  Her hemoglobin was little low, but she was noted below   preoperatively.  She was stable at this point.  We did send her iron   supplement.  She is ready to go home.      DISCHARGE PLAN:  Patient discharged home on January 14, 2009.      DISCHARGE DIAGNOSES:  Please see above.      DISCHARGE MEDICATIONS:   1. Percocet.   2. Robaxin.   3. Coumadin.   4. Nu-Iron.      DIET:  Heart-healthy, diabetic diet.      ACTIVITY:  She is total knee protocol.  Weightbearing as tolerated.   Home with PT and home health nursing.      DISCHARGE FOLLOWUP:  In 2 weeks.      DISPOSITION:  Home.      CONDITION ON DISCHARGE:  Improving.               Alexzandrew L. Perkins, P.A.C.               Frank Aluisio, M.D.  

## 2010-11-04 NOTE — Consult Note (Signed)
NAME:  Tricia Gonzales, Tricia Gonzales NO.:  [id number]   MEDICAL RECORD NO.:  [id number]          PATIENT TYPE:  INP   LOCATION:  1522                         FACILITY:  [facility]   PHYSICIAN:  Marland Kitchen T. Hoxworth, M.D.DATE OF BIRTH:  11-27-1940   DATE OF CONSULTATION:  01/29/2009  DATE OF DISCHARGE:                                 CONSULTATION   REFERRING PHYSICIAN:  [facility] hospitalist.   CHIEF COMPLAINT:  Abdominal pain.   HISTORY:  I was asked by the [facility] hospitalist to evaluate Ms. Modesitt.  She is a very pleasant 70 year old white female with a number of  significant medical problems who was admitted to the hospital on August  8 with abdominal pain.  The patient states she has a history of  diverticulitis about 15 years ago which was treated with outpatient  antibiotics.  She underwent colonoscopy at that time.  Since then she  has had minimal difficulty with just occasional mild lower abdominal  pain that she will manage by restricting her diet and it quickly  resolves.  She has not required any further medical treatment until now.   The patient is status post recent redo left total knee replacement by  Dr. Wynelle Link on July 21.  She states that around the time of that  surgery, probably due to pain medication, she developed some  constipation and then had some recurrent pain in her lower abdomen left  greater than right.  This started significantly about 5-6 days prior to  admission.  It was persistent pain, moderately severe.  She was being  seen by home health from her knee surgery and some at least low grade  fever was documented.  She presented for evaluation and was admitted on  August 8 based on CT findings of diverticulitis and abscess as described  below.   The patient has been on IV antibiotics since admission and states she is  feeling much better than admission with now relatively mild occasional  pain.  She has had some loose stools since admission.  No current  fever  or chills.  Tolerating liquids without difficulty.  She has not noted  any melena or hematochezia.  Last colonoscopy was about 12-15 years ago.   PAST MEDICAL HISTORY:  Medically she is followed for:  1. Morbid obesity.  2. Hypertension.  3. Type 2 diabetes.  4. Hyperlipidemia.   SURGERY:  Includes previous open cholecystectomy years ago and previous  knee replacements on each side.   MEDICATIONS ON ADMISSION:  1. She was on Coumadin for prophylaxis following her knee replacement.  2. Glipizide 10 mg b.i.d.  3. Neurontin 300 b.i.d.  4. Metoprolol 100 mg daily.  5. Cardura 4 mg daily.  6. Robaxin p.r.n.  7. Lasix 40 mg daily.  8. Trental 400 mg daily.  9. Protonix 40 daily.  10.Trazodone 50 at night.  11.Zocor 20 mg daily.  12.Since admission she is on Cipro 400 mg IV every 12 hours.  13.Flagyl 500 mg IV every 8 hours.   ALLERGIES:  NORVASC, METFORMIN AND CONTRAST DYE.   SOCIAL HISTORY:  Lives alone.  Does not smoke cigarettes, drinks  moderate amount of alcohol.   FAMILY HISTORY:  Noncontributory.   REVIEW OF SYSTEMS:  GENERAL:  Some malaise and low grade fever with this  illness.  HEENT:  Denies vision, hearing, swallowing problems.  RESPIRATORY:  Denies shortness of breath, cough, wheezing.  CARDIAC:  Denies chest pain, palpitations, history of heart disease.  ABDOMEN/GI:  As above.  GU:  She has had a moderate amount of urinary hesitancy with  her knee replacement.  No burning, frequency, pneumaturia.  EXTREMITIES:  Positive arthritis pain, difficult mobility.  HEMATOLOGIC:  Denies  history of blood clots, abnormal bleeding.   PHYSICAL EXAM:  Temperature is 98.6, respirations 16, heart rate 72,  blood pressure 102/62.  GENERAL:  Pleasant, morbidly obese female, no acute distress.  SKIN:  Warm, dry.  No rash or infection.  HEENT:  No palpable mass or thyromegaly.  Sclerae nonicteric.  Oropharynx clear.  LYMPH NODES.  No cervical, subclavicular or inguinal  nodes palpable.  LUNGS:  Clear without wheezing or increased work of breathing.  CARDIAC:  Regular rate and rhythm.  No significant edema or JVD noted.  ABDOMEN:  Obese.  No hernias.  Well-healed Kocher incision.  Soft  without apparent tenderness.  No discernible masses, but limited due to  obesity.  EXTREMITIES:  Healing incision left knee with minimal erythema.  NEUROLOGIC:  Alert, oriented.  Motor and sensory exams grossly normal.   LABORATORY:  Currently white count is 9.4, hemoglobin is 86.  Electrolytes, BUN, creatinine unremarkable.  Stool for occult blood was  negative.  CA-125 within normal limits.   IMAGING:  CT scan of the abdomen and pelvis was reviewed.  This shows  diverticulosis of the left and sigmoid colon.  There is a 4 x 4.1 cm  collection of air and fluid adjacent to the sigmoid colon consistent  with a pericolonic diverticular abscess.  Below this and adjacent to the  bladder is a complex cystic solid mass measuring 8.8 x 6.3 cm felt to be  most consistent with a left ovarian mass.   ASSESSMENT/PLAN:  A 70 year old female with multiple medical problems  status post recent total knee replacement.  She has an apparent small  peridiverticular abscess.  This was not felt amenable to percutaneous  drainage by Interventional Radiology but is small enough that it could  very likely be adequately treated with antibiotics alone.  She has never  been hospitalized for diverticulitis previously and has had minimal  ongoing symptoms.  She would be at increased risk for colectomy due to  her morbid obesity and other medical problems.  1. At this point I would not recommend surgery unless her abscess      fails to resolve or she has ongoing significant symptoms.  2. She will require a colonoscopy once this resolves.  3. She will require further evaluation of her ovarian mass which is      being arranged by the hospitalist team.  4. I would think she is nearing time for  discharge and could follow up      with oral antibiotics and repeat CT scan as an outpatient in 2-3      weeks and I would be happy to see her back in the office at that      time.  We will follow with you.      Darene Lamer. Hoxworth, M.D.  Electronically Signed     BTH/MEDQ  D:  01/29/2009  T:  01/29/2009  Job:  [id number]

## 2010-11-04 NOTE — Op Note (Signed)
NAME:  Tricia, Gonzales NO.:  [id number]   MEDICAL RECORD NO.:  [id number]          PATIENT TYPE:  INP   LOCATION:  0001                         FACILITY:  [facility]   PHYSICIAN:  Gaynelle Arabian, M.D.    DATE OF BIRTH:  Nov 26, 1940   DATE OF PROCEDURE:  DATE OF DISCHARGE:                               OPERATIVE REPORT   POSTOPERATIVE DIAGNOSIS:  Osteoarthritis, right knee.   POSTOPERATIVE DIAGNOSIS:  Osteoarthritis, right knee.   PROCEDURE:  Right total knee arthroplasty.   SURGEON:  Gaynelle Arabian, M.D.   ASSISTANT:  Arlee Muslim, PA-C.   ANESTHESIA:  General with postop Marcaine pain pump.   ESTIMATED BLOOD LOSS:  Minimal.   DRAIN:  None.   TOURNIQUET TIME:  34 minutes at 300 mmHg.   COMPLICATIONS:  None.   CONDITION.:  Stable to recovery.   CLINICAL NOTE:  Tricia Gonzales is a 70 year old female with severe end-stage  arthritis of the right knee with progressively worsening pain and  dysfunction.  She has failed nonoperative treatment and presents now for  right total knee arthroplasty.   PROCEDURE IN DETAIL:  After the successful administration of general  anesthetic, a tourniquet was placed high on her right thigh and the  right lower extremity prepped and draped in the usual sterile fashion.  The extremity was wrapped in an Esmarch, the knee flexed and tourniquet  inflated to 300 mmHg.  A midline incision was made with a 10 blade  through subcutaneous tissue, which was a very thick layer to the level  of the extensor mechanism.  Despite her valgus deformity, I decided to  do a medial approach because of a lot of stiffness in the knee.  A fresh  blade was used make a medial parapatellar arthrotomy.  We did not  disrupt the soft tissue sleeve medially.  I did elevate the soft tissue  laterally with attention being paid to avoid patellar tendon on tibial  tubercle.  The patella was everted, the knee flexed 90 degrees.  The ACL  remnant was removed and the  PCL removed.  A drill was used to create a  starting hole in the distal femur and the canal was thoroughly  irrigated.  The 5-degree right valgus alignment guide was placed  referencing off the posterior condyles, rotation was marked, and the  block pinned to remove 11 mm off the distal femur.  We took 11 so we  could capture some bone laterally.  Distal femoral resection was made  with an oscillating saw.  A sizing block was placed and a size 3 was the  most appropriate.  The rotation was marked off the epicondylar axis.  Size 3 cutting blocks were placed and the anterior-posterior chamfer  cuts made.   The tibia subluxed forward and the menisci were removed.  The  extramedullary tibial alignment guide was placed referencing proximally  at the medial aspect of the tibial tubercle and distally along the  second metatarsal axis and tibial crest.  Blocks pinned to remove about  2 mm in the more deficient lateral side.  Tibial  resection was made with  an oscillating saw.  Size 3 was the most appropriate tibial component  and the proximal tibia was prepared with the modular drill and keel  punch for a size 3.  Femoral preparation was completed with the  intercondylar cut.   A size 3 mobile bearing tibial trial size 3 posterior stabilized femoral  trial and 12.5-mm posterior stabilized rotating platform insert trial  were placed.  She hyperextends a little bit at 12 and as we went to 15,  which allowed full extension with excellent varus valgus anterior-  posterior balance throughout full range of motion.  The patella was then  everted and thickness measured to be 20 mm.  Freehand resection was  taken to 12 mm, a 35 template was placed, lug holes were drilled, trial  patella was placed and it tracks normally.  Osteophytes were removed  from the posterior femur and the trial placed.  All trials removed and  then the cut bone surfaces were prepared with pulsatile lavage.  Cement  was mixed  and once ready for implantation the size 3 mobile bearing  tibial tray size 3 posterior stabilized femur and 35 patella were  cemented into place.  The patella was held with a clamp.  The trial 15  insert was placed, knee held in full extension, all extruded cement  removed.  When the cement was fully hardened, then the wound was  copiously irrigated with saline solution.  FloSeal was injected in the  posterior capsule and the permanent 15-mm posterior stabilized rotating  platform insert was placed in the tibial tray.  FloSeal was injected in  the mediolateral gutters and suprapatellar area.  A moist sponge was  placed and the tourniquet released for a total time of 34 minutes.  The  sponge was held for 2 minutes and removed.  Minimal bleeding was  encountered.  That which was encountered was stopped with  electrocautery.  The arthrotomy was then closed with a running #2 quill  suture.  Flexion against gravity to 135 degrees.  The subcu was closed  with interrupted 2-0 Vicryl and subcuticular running 4-0 Monocryl.  A  catheter for the Marcaine pain pump was placed and the pump was  initiated.  Steri-Strips and a bulky sterile dressing were then applied.  She was then placed into a knee immobilizer, awakened and transported to  recovery in stable condition.      Gaynelle Arabian, M.D.  Electronically Signed     FA/MEDQ  D:  01/04/2008  T:  01/04/2008  Job:  [id number]

## 2010-11-04 NOTE — H&P (Signed)
NAME:  Tricia Gonzales, Tricia Gonzales NO.:  [id number]   MEDICAL RECORD NO.:  [id number]          PATIENT TYPE:  INP   LOCATION:  NA                           [facility]ACILITY:  [facility]   PHYSICIAN:  Gaynelle Arabian, M.D.    DATE OF BIRTH:  1941-04-22   DATE OF ADMISSION:  01/04/2008  DATE OF DISCHARGE:                              HISTORY & PHYSICAL   History and Physical performed on December 08, 2007.   CHIEF COMPLAINT:  Right knee pain.   HISTORY OF PRESENT ILLNESS:  The patient is a 70 year old female who is  being seen by Dr. Wynelle Link for increasing night pain in her right knee.  She has known end-stage arthritis and has progressively gotten worse.  She has been treated conservatively in the past including injections.  She has reached a point now where she would benefit from undergoing knee  surgery.  Risks and benefits discussed.  The patient is subsequently  admitted to the hospital for surgery.   ALLERGIES:  No known drug allergies.   CURRENT MEDICATIONS:  Glipizide XR, Actos, metoprolol, Cardura, Lasix,  Trental, aspirin, Protonix, Neurontin, Zocor, Trazodone, Singular,  Centrum Silver, Vitamin D, folic acid, Vicodin, prednisone.   PAST MEDICAL HISTORY:  1. Insulin-dependent diabetes mellitus.  2. Lipodermatosclerosis.  3. Hyperlipidemia.  4. Hypertension.  5. Chronic low back pain.  6. Renal insufficiency.  7. History of anemia.  8. Hiatal hernia.  9. Varicose veins.  10.Hemorrhoids.  11.Diverticulosis.  12.Renal calculi.  13.Childhood illnesses to include measles and mumps.   PAST SURGICAL HISTORY:  1. Gallbladder.  2. Right knee arthroscopy.  3. Left total knee.   FAMILY HISTORY:  Father with history of lung cancer.  Mother with  history of diabetes.   SOCIAL HISTORY:  Divorced, retired. Worked for [employer]  Past  smoker, 2-4 glasses of wine per week. Lives alone.  She does have  someone lined up to assist her with care after surgery.   REVIEW OF  SYSTEMS:  GENERAL:  No fevers, chills, night sweats.  NEUROLOGIC:  A little bit of insomnia. No seizure, syncope, paralysis.  RESPIRATORY:  A little bit of shortness of breath on exertion but no  shortness of breath at rest.  No productive cough or hemoptysis.  CARDIOVASCULAR:  No chest pain, angina, orthopnea.  GI:  No nausea,  vomiting, diarrhea, constipation.  GU:  No dysuria, hematuria,  discharge.  MUSCULOSKELETAL:  Joint pain.   PHYSICAL EXAMINATION:  VITAL SIGNS:  Pulse 64, respirations 14, blood  pressure 110/72.  GENERAL:  A 70 year old white female, well-nourished, well-developed,  overweight, morbidly obese.  Alert, oriented and cooperative, pleasant.  Excellent historian.  HEENT:  Normocephalic and atraumatic.  Pupils round reactive.  Oropharynx clear.  Extraocular movements intact.  NECK: Supple.  CHEST:  Clear.  HEART:  Regular rate and rhythm.  No murmur.  S1, S2 noted.  ABDOMEN:  Soft, nontender.  Bowel sounds present.  RECTAL, BREASTS, GENITALIA:  Not done, not pertinent to present illness.  EXTREMITIES:  Right knee:  No swelling, marked crepitus, no instability.   IMPRESSION:  1. Osteoarthritis, right knee.  2. Non-insulin-dependent diabetes mellitus.  3. Lipodermatosclerosis.  4. Hyperlipidemia.  5. Hypertension.  6. Chronic low back pain.  7. Renal insufficiency.  8. History of anemia.  9. Hiatal hernia.  10.Varicose veins.  11.Hemorrhoids.  12.Diverticulosis.  13.Renal calculi.  14.Childhood illnesses include measles and mumps.   PLAN:  The patient will be admitted to [facility] and undergo  right total knee replacement arthroplasty.  Surgery will be performed by  Dr. Gaynelle Arabian.  She has been seen preoperatively by Dr. Knute Neu. She has also been seen preoperatively by Dr. Kelton Pillar and  felt to be stable for surgery.  Recommends steroid coverage during  surgery.      Alexzandrew L. Perkins, P.A.C.      Gaynelle Arabian,  M.D.  Electronically Signed    ALP/MEDQ  D:  01/03/2008  T:  01/03/2008  Job:  [id number]   cc:   Milford Cage. Laurann Montana, M.D.  Fax: [facility], M.D.  Fax: [phone]   Lowella Bandy. [address]hes, [place]  [place] [address]

## 2010-11-04 NOTE — H&P (Signed)
NAME:  Tricia Gonzales NO.:  [id number]   MEDICAL RECORD NO.:  [id number]          PATIENT TYPE:  INP   LOCATION:  1522                         FACILITY:  [facility]   PHYSICIAN:  Corinna L. Conley Canal, MDDATE OF BIRTH:  24-Jun-1940   DATE OF ADMISSION:  01/26/2009  DATE OF DISCHARGE:                              HISTORY & PHYSICAL   CHIEF COMPLAINT:  Suprapubic pain and left lower quadrant pain.   HISTORY OF PRESENT ILLNESS:  Tricia Gonzales is pleasant 70 year old white  female patient of Dr. Laurann Montana with a history of diverticulitis in the  past who presents with abdominal pain.  She noted the pain has been  present for several weeks but seemed to come and go.  It is  progressively become worse.  She feels that it is similar to her  previous episode of diverticulitis about 10 years ago.  Her appetite has  been poor.  She had a few episodes of diarrhea.  She has no vomiting.  She has no hematochezia.   PAST MEDICAL HISTORY:  Recent left total knee arthroplasty revision on  July 21 by Dr. Wynelle Link, type 2 diabetes, previous diverticulitis,  hypertension, hyperlipidemia, lipodermatosclerosis, status post  cholecystectomy, total right knee replacement, morbid obesity.   MEDICATIONS:  Include Coumadin, glipizide 10 mg p.o. b.i.d., Neurontin  300 mg p.o. b.i.d., metoprolol 100 mg a day, Cardura 4 mg a day,  Coumadin per pharmacy, Robaxin as needed, Lasix 40 mg a day, Trental 400  mg a day, Protonix 40 mg a day, trazodone 50 mg nightly, Zocor 20 mg a  day.   SOCIAL HISTORY:  The patient lives alone and has an aide and home health  home physical therapy since her operation.  She does not smoke or drink.   FAMILY HISTORY:  Reviewed and as per previous.   PAST SURGICAL HISTORY:  Reviewed and as per previous.   REVIEW OF SYSTEMS:  As above, otherwise negative.   PHYSICAL EXAMINATION:  VITAL SIGNS:  Temperature is 98.4, blood pressure  184/67, pulse 73, respiratory rate 19,  oxygen saturation 94% on room  air.  GENERAL:  The patient is a morbidly obese white female in no acute  distress.  HEENT: Normocephalic, atraumatic.  Pupils equal, round, reactive to  light.  Sclerae nonicteric.  Moist mucous membranes.  NECK:  Supple.  No lymphadenopathy.  LUNGS:  Clear to auscultation bilaterally without wheezes, rhonchi or  rales.  CARDIOVASCULAR:  Regular rate and rhythm without murmurs, gallops or  rubs.  ABDOMEN:  Obese, soft, lower abdominal tenderness.  EXTREMITIES:  She has an incision over the left knee with some mild  surrounding erythema.  No drainage. Full range of motion. No edema.  Pulses are intact.  NEUROLOGIC:  Alert and oriented.  Cranial nerves and sensorimotor exam  intact.  PSYCHIATRIC:  Normal affect.  SKIN No rash.   LABORATORY DATA:  White blood cell count is 10,800 with 89% neutrophils.  Hemoglobin is 10.1, hematocrit is 31, platelet count 436,000.  Complete  metabolic panel is significant for a glucose of 160, albumin of 2.6,  lipase normal.  Urinalysis normal.   CT of the abdomen and pelvis show an 8 x 6 cm structure in the central  pelvis suggesting subserosal pedunculated degenerated fibroid.  However,  light gonadal vessels track directly into this lesion and cystic ovarian  unit neoplasm also a consideration, which between the structure and mid  sigmoid is a 4 x 4 cm air and fluid collection, representing a  pericolonic abscess, most likely related to diverticulitis.   ASSESSMENT/PLAN:  1. Diverticulitis with small abscess:  I have discussed the case with      Dr. Vernard Gambles, interventional radiologist.  As this fluid collection      is small and surrounded by bowel and solid organs, it is not      amendable to drainage.  IV antibiotics and clear liquids.  I will      give IV antibiotics.  Monitor white count, serial abdominal exams.      She will get Cipro and Flagyl.  She may need re-imaging at some      point if her symptoms  fail to improve.  She will get pain      medications, clear liquid diet.  Continue Coumadin for      postoperative DVT prophylaxis.  She has about another week's worth      of Coumadin.  2. Recent left total knee revision:  Monitor for cellulitis.  Continue      ambulation with walker. Get PT consult.  3. Morbid obesity.  4. Hypertension.  5. Hyperlipidemia.  6. Diabetes:  Sliding scale for now.      Corinna L. Conley Canal, MD  Electronically Signed     CLS/MEDQ  D:  01/27/2009  T:  01/27/2009  Job:  [id number]   cc:   Gaynelle Arabian, M.D.  Fax: [place]. Laurann Montana, M.D.  Fax: [phone]

## 2010-11-04 NOTE — Op Note (Signed)
NAME:  Tricia Gonzales, Tricia Gonzales NO.:  [id number]   MEDICAL RECORD NO.:  [id number]          PATIENT TYPE:  INP   LOCATION:  0008                         [facility]AC[facility]   PHYSICIAN:  Gaynelle Arabian, M.D.    DATE OF BIRTH:  08/02/1940   DATE OF PROCEDURE:  01/09/2009  DATE OF DISCHARGE:                               OPERATIVE REPORT   PREOPERATIVE DIAGNOSIS:  Failed left total knee arthroplasty.   POSTOPERATIVE DIAGNOSIS:  Failed left total knee arthroplasty.   PROCEDURE:  Left total knee arthroplasty revision.   SURGEON:  Gaynelle Arabian, M.D.   ASSISTANT:  Arlee Muslim, PA-C.   ANESTHESIA:  General with postop Marcaine pain pump.   ESTIMATED BLOOD LOSS:  Minimal.   DRAIN:  Hemovac x1.   TOURNIQUET TIME:  Up 64 minutes at 300 mmHg, down 10 minutes, up an  additional 17 minutes at 300 mmHg.   COMPLICATIONS:  None.   CONDITION.:  Stable to recovery.   CLINICAL NOTE:  Tricia Gonzales is a 70 year old female who has a loose  unstable left total knee arthroplasty.  The tibia is grossly loose and  collapsed into significant varus.  She has had intractable pain.  She  presents now for total knee arthroplasty revision.   PROCEDURE IN DETAIL:  After successful administration of general  anesthetic, a tourniquet was placed high on her left thigh and the left  lower extremity prepped and draped in the usual sterile fashion.  The  extremity was elevated and the tourniquet inflated to 300 mmHg.  A  midline incision was made with a 10 blade through a very deep layer of  subcutaneous tissue to the level of the extensor mechanism.  A fresh  blade was used make a medial parapatellar arthrotomy.  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  The patella was subluxated  laterally.  The soft tissue on the proximal medial tibia was then  subperiosteally elevated to the joint line with the knife into the  semimembranosus bursa with a Cobb elevator.  The knee was flexed 90  degrees.  The tibia, as mentioned, was in significant varus.  I placed  an osteotome between the tibial polyethylene tibial tray to disrupt the  junction between those and to remove the tibial polyethylene.  We then  placed a retractor and subluxated the tibia forward.  I was easily able  to remove the tibial component without having to disrupt any interface  between the component and bone.  It was grossly loose and collapsed into  varus.  The tibial component was then removed.   The femoral component was removed by disrupting the interface between  the femur and bone utilizing osteotomes.  It was removed relatively  easily and there was minimal, if any, bone loss.  I inspected the  patellar component.  There was a lot of bone and tissue overgrowing the  component.  I removed the tissue and did a patellaplasty by removing the  overhanging bone.  The patellar component was well fixed and not  worn,  so I left it in place.   The cement was removed from the tibial keel.  The tibia was cemented  only at the surface and not down into the canal.  I drilled to create a  starting hole in tibial and femoral canals and then thoroughly irrigated  both.  I reamed on tibial side to 13 mm and on the femoral side to 18  mm.   The tibia was again subluxated forward and the extramedullary tibial  alignment guide was placed.  It was placed proximally at the medial  aspect of the tibial tubercle and distally along the second metatarsal  axis and tibial crest.  I pinned the block to remove about 4 mm from the  lateral side.  This was still a defect medially.  I went down into the  10-mm position to get to decent bone.  This would effectively then lead  to placement of a 10-mm augment on the medial side.  A size 3 was most  appropriate tibial component and we did the proximal preparation with  the modular drill.  I also elected to put a  sleeve for rotational  control and I broached up to a size 29 sleeve.  A trial prosthesis was  then placed.  A size 3 MBT revision tray with a 10-mm augment medially  and with a 29 sleeve.  This had great fit on the tibia.  I used the size  2.5 augment so as to taper down to get to the size of her tibia.  The  fit was excellent on the cut bone surfaces and it was a nice stable  construct.  I removed the trial.  Then I went on to prepare the femur.   An 18-mm reamer was placed to serve as our intramedullary cutting guide.  The 5-degree left valgus alignment guide was placed and the block was  pinned to remove about 2 mm off the distal femur.  The resection was  made with an oscillating saw down to healthy-appearing bone.  Removal of  the bone elevated the joint line about 4 mm, and thus I was going to use  4-mm distal augments medial and lateral.  A size 3 was the most  appropriate femoral component.  The AP cutting block was then placed  into +2 position to effectively raise the stem and lower the component  back down to the anterior cortex of the femur.  It was placed and a 15-  mm spacer block was placed in flexion so that we could get a symmetric  flexion gap and gauge the rotation of our cutting block.  The block was  then pinned and the anterior-posterior cuts made.  I had to go to a +4  position to get bone posteriorly both medial and lateral.  The  intercondylar block was then placed and the intercondylar and chamfer  cuts.   On the tibial side, we put together the tibial trial.  Once again, it  was a size 3 MBT revision tray with 2.5 x 10 medial augment and then the  29 sleeve and a 13 x 60 stem extension.  Once again, we had a very good  fit with this.  On the femoral side, the prosthesis was a 18 x 75 stem  in a +2 position, size 3 TC3 femur with 4-mm medial and lateral distal  augments and 4-mm medial and lateral posterior augments.  This also had  a very good fit on the  cut  bone surface.  I went over a 20 on the insert  and thus we changed augments on the tibial side to a 15 medial and a 5  lateral.  With this, a 22.5 insert allowed for full extension with  excellent varus-valgus and anterior-posterior balance throughout full  range of motion.  We then let the tourniquet down for total time of 64  minutes.  While the tourniquet was down, the components were assembled  on the back table.  The components were assembled as stated above with  those sizes.  Note the tibial changed to a 15-mm medial augment and a 5-  mm lateral augment to affect both extension and flexion.  The tourniquet  was down for a total of 10 minutes.  We then rewrapped the leg and  reinflated the tourniquet.  The implants were removed and a trial for  the cement restricter was placed and a size 4 was most appropriate.  A  size 4 cement restricter was then placed to the appropriate depth in the  tibial canal.  The cut bone surfaces were then thoroughly irrigated with  pulsatile lavage as was the canal.  The cement was mixed and once ready  for implantation, the tibial component was cemented in place, impacted,  and extruded cement removed.  The femoral component was cemented  distally but not at the stem, which was press fit.  The trial 22.5  insert was placed, the knee held in full extension, and all extruded  cement removed.  Once the cement had fully hardened, then the permanent  22.5-mm TC3 RP insert was placed into the tibial tray.  The wound was  again irrigated and FloSeal injected in the medial and lateral gutters  and suprapatellar area.  A moist sponge was placed and the tourniquet  released for a second time of 17 minutes.  The sponge was held for 2  minutes and removed.  Minimal bleeding was encountered.  The bleeding  that was encountered was stopped with electrocautery.  The wound was  again irrigated to remove the FloSeal and then the arthrotomy closed  over a Hemovac drain  with interrupted #1 PDS.  Flexion against gravity  was about 120 degrees, at which point the calf and posterior thigh are  touching.  The subcu was  closed in multiple layers with interrupted 2-0 Vicryl and the skin  closed with staples.  The catheter for the Marcaine pain pump was placed  and the pump was initiated.  The drain was hooked to suction and a bulky  sterile dressing applied.  She was placed into a knee immobilizer,  awakened and transported to recovery in stable condition.      Gaynelle Arabian, M.D.  Electronically Signed     FA/MEDQ  D:  01/09/2009  T:  01/09/2009  Job:  [id number]

## 2010-11-04 NOTE — Discharge Summary (Signed)
NAME:  Tricia Gonzales, Tricia Gonzales NO.:  [id number]   MEDICAL RECORD NO.:  [id number]          PATIENT TYPE:  INP   LOCATION:  1522                         FACILITY:  [facility]   PHYSICIAN:  Corinna L. Conley Canal, MDDATE OF BIRTH:  1941-05-19   DATE OF ADMISSION:  01/26/2009  DATE OF DISCHARGE:  02/01/2009                               DISCHARGE SUMMARY   DISCHARGE DIAGNOSES:  1. Diverticulitis with peridiverticular abscess.  2. Recent left total knee revision.  3. Multifactorial anemia.  4. Morbid obesity  5. Diabetes.  6. Hypertension.  7. Hyperlipidemia.  8. Lipodermatosclerosis.   DISCHARGE MEDICATIONS:  1. Ciprofloxacin 500 mg twice a day for another week.  2. Flagyl 500 mg p.o. t.i.d. for another week.  3. Change glipizide to 5 mg twice a day for a week, then 10 mg twice a      day.  4. Stop Coumadin as previously scheduled.  5. Continue Phenergan as needed.  6. Neurontin 300 mg twice a day.  7. Metoprolol XL 100 mg a day.  8. Cardura 4 mg nightly.  9. Robaxin 500 mg as needed.  10.Lasix 40 mg a day.  11.Trental 40 mg a day.  12.Protonix 40 mg a day.  13.Zocor 20 mg a day.  14.Trazodone 50 mg nightly.  15.Hydrocodone/APAP as needed, as previous.   CONDITION:  Stable.   ACTIVITY:  Continue walker and physical therapy at home.   DIET:  Diabetic cardiac.   FOLLOW-UP:  Follow-up CAT scan of the abdomen and pelvis will be range  by discharging staff for 2 weeks to evaluate her abscess.  Follow-up  with Dr. Laurann Montana after CAT scan done.  If abscess worse or still  present, follow up with surgery.  If abscess gone or improving, follow  up with Dr. Delfin Edis for eventual colonoscopy.  Follow up with  gynecologist in 2 months to further evaluate pelvic mass, suspect  fibroid.   CONSULTATIONS:  Dr. Excell Seltzer.   PROCEDURES:  None.   LABS:  White blood cell count on admission 10,800 with 89% neutrophils,  hemoglobin 10, hematocrit 31.2, platelet count 436.   Hemoglobin on 08/12  is 8.4.  INR on admission 1.7, at discharge 2.5.  Complete metabolic  panel on admission significant for a bicarbonate of 33, glucose 160,  albumin 2.6, normal lipase, iron 23, TIBC 186, ferritin 240.  Urinalysis  negative.  Urine culture grew out greater than 100,000 colonies of E-  coli and group B strep which was pansensitive.  Hemoccult of the stool  is negative x2.  CA-125 is normal at 17.   SPECIAL STUDIES:  Radiology:  CT of the abdomen and pelvis shows 4-cm  peridiverticular abscess.  An 8 x 6-cm cystic and solid-appearing  structure in the central pelvis between rectum and bladder, suggesting  uterine origin, possibly representing a subserosal pedunculated  degenerated fibroid.  However, right gonadal vessels track directly into  this lesion and cannot rule out cystic ovarian neoplasm.   HISTORY AND HOSPITAL COURSE:  Ms. Hook is a pleasant 70 year old white  female patient of Dr. Kelton Pillar  with multiple medical problems, who  presented with abdominal pain, mainly suprapubic and left lower  quadrant.  Please see H and P for complete admission details.  She was  found to have a small, 4-cm x 4-cm peridiverticular abscess.  It was  small and not amendable to drainage.  She was started on Cipro and  Flagyl.  Surgery was consulted and felt that this hopefully can be  treated medically.  She would be at increased risk for surgery due to  morbid obesity and her other medical problems.  The plan is at this  point to re-scan her in 2 weeks after treatment of the diverticulitis  and abscess and, if the abscess is bigger or unresolved, follow up with  Dr. Excell Seltzer as an outpatient for further management.  If CT scan is  negative for worsening abscess, she will need eventual colonoscopy.  She  is known to Dr. Olevia Perches and will need referral to Dr. Olevia Perches.  I have  asked her to follow up with Dr. Laurann Montana after the CT scan was done.  I  will also leave a message for  Dr. Laurann Montana, detailing the plan.   At the time of discharge the patient's pain, nausea, anorexia were all  improved and she was cleared by surgery for discharge.  She is  tolerating a diet, ambulating.   She had had a recent left total knee revision and continued on Coumadin  for DVT prophylaxis during the hospitalization.  She also received  physical therapy.  Today was scheduled to be her last dose of Coumadin  for postoperative DVT prophylaxis and I will stop  the Coumadin as  previously planned.   Her other medical problems remained stable, other than the  multifactorial anemia.  I suspect this is due to infection, recent  surgery, etc.  Due to the GI process, I am hesitant to start her on  iron.  However, this can be an option once her GI process has resolved.   Total time on the day of discharge is 45 minutes.      Corinna L. Conley Canal, MD  Electronically Signed     CLS/MEDQ  D:  02/01/2009  T:  02/01/2009  Job:  [id number]   cc:   [place] Cage. Laurann Montana, M.D.  Fax: [place] T. Hoxworth, M.D.  [address] [place] [address]   Dora M. Olevia Perches, [address]  [place]  [place] [address]   Gaynelle Arabian, M.D.  Fax: [phone]

## 2010-11-07 NOTE — Discharge Summary (Signed)
NAME:  Tricia Gonzales, Tricia Gonzales NO.:  [id number]   MEDICAL RECORD NO.:  [id number]          PATIENT TYPE:  INP   LOCATION:  52                         FACILITY:  [facility]   PHYSICIAN:  Gaynelle Arabian, M.D.    DATE OF BIRTH:  01/15/1941   DATE OF ADMISSION:  01/04/2008  DATE OF DISCHARGE:  01/09/2008                               DISCHARGE SUMMARY   ADMISSION DIAGNOSES:  1. Osteoarthritis right knee.  2. Non-insulin-dependent diabetes mellitus.  3. Sclerosis.  4. Hyperlipidemia.  5. Hypertension.  6. Chronic low back pain.  7. Renal insufficiency.  8. History of anemia.  9. Hiatal hernia.  10.Varicose veins.  11.Diverticulosis.  12.Childhood illnesses of measles and mumps.   DISCHARGE DIAGNOSIS:  1. Osteoarthritis right knee status Gonzales right total knee      arthroplasty.  2. Postop blood loss anemia.  3. Status Gonzales transfusion without sequelae.  4. Non-insulin-dependent diabetes mellitus.  5. Sclerosis.  6. Hyperlipidemia.  7. Hypertension.  8. Chronic low back pain.  9. Renal insufficiency.  10.History of anemia.  11.Hiatal hernia.  12.Varicose veins.  13.Diverticulosis.  14.Childhood illnesses of measles and mumps.   PROCEDURE:  January 08, 2008 right total knee.  Surgeon, Dr. Wynelle Link.  Assistant, Arlee Muslim, PA-C.   ANESTHESIA:  General.   CONSULTS:  None.   BRIEF HISTORY:  Tricia Gonzales is a 70 year old female with severe end-stage  arthritis right knee, progressive worsening pain and dysfunction, failed  nonoperative management, presents for total knee arthroplasty.   LABORATORY DATA:  Preop CBC hemoglobin 10.8 hematocrit 30.8, white cell  count 6.6, platelets 240.  Hemoglobin down to 8.7 and 9.2.  Last H&H 9.2  and 27.  Serial PT/PTT preop 13.2 and 26 respectively.  INR 1.  Serial  pro time followed.  PT 43.3.  Chem panel on admission low albumin of  3.4, elevated BUN and creatinine of 34.22, glucose 170.  Serial BMET  followed.  Glucose 183 back  down to 161, BUN went up to 39 back down to  36, creatinine went up to 1.67 to 1.51, improving.  Preop UA small  leukocyte esterase.  White cells, hyaline casts.  Blood type A positive.  EKG dated July 2 normal sinus rhythm, delayed R-wave progression,  confirmed by Dr. Jacqulyn Ducking.   HOSPITAL COURSE:  The patient was admitted to [facility],  tolerated procedure well, later transferred to recovery room then  orthopedic floor started on PCA and p.o. analgesic for pain control  following surgery.  Doing pretty well on the morning of day one.  Hemoglobin started out low at 10.8.  She was already down to 8.7.  BUN,  creatinine was up a little bit with some mild azotemia and mild renal  insufficiency likely due to low blood.  She was given fluids.  Was felt  she best served by undergoing transfusion, which she did.  She started  back home meds.  Blood pressure was stable postop, monitored closely.  She got up and walked about 7 feet by day two, doing a little bit  better.  She  is able to get some sleep.  Hemoglobin was improved to 9.2  and BUN and creatinine up a little bit.  Postoperatively, pressure was  good, output was excellent.  Incision looked good on the dressing  change.  CBG improving felt to be some of the spacing of the fluids.  She diuresed with fluids that would improve.  She was ambulating better  at 60 feet by day three.  She was doing well, tolerating her meds.  Has  been weaned over p.o. meds, progressing with therapy.  Stayed through  the weekend to receive therapy.  She seen back on rounds on January 09, 2008, progressing with PT.  Weaned over to p.o. meds and discharged  home.   DISCHARGE/PLAN:  1. The patient discharged home on January 09, 2008.  2. Discharge diagnoses please see above.  3. Discharge meds Percocet, Robaxin, Nu-Iron Coumadin.   DIET:  Heart-healthy diabetic diet.   ACTIVITY:  Weightbearing as tolerated right leg, total knee protocol,  home  PT and nursing followup 2 weeks.   DISPOSITION:  Home.   CONDITION ON DISCHARGE:  Improved.      Alexzandrew L. Perkins, P.A.C.      Gaynelle Arabian, M.D.  Electronically Signed    ALP/MEDQ  D:  02/28/2008  T:  02/28/2008  Job:  [id number]   cc:   Gaynelle Arabian, M.D.  Fax: [place]. Laurann Montana, M.D.  Fax: [place] Rockwell Alexandria, M.D.  Fax: [phone]   Lowella Bandy. Olevia Perches, [place]  [place] [address]

## 2010-11-07 NOTE — H&P (Signed)
NAME:  Tricia Gonzales, Tricia Gonzales NO.:  [id number]   MEDICAL RECORD NO.:  [id number]          PATIENT TYPE:  INP   LOCATION:  NA                           FACILITY:  [facility]   PHYSICIAN:  Gaynelle Arabian, M.D.    DATE OF BIRTH:  06/27/1940   DATE OF ADMISSION:  01/09/2009  DATE OF DISCHARGE:                              HISTORY & PHYSICAL   CHIEF COMPLAINT:  Loose left total knee arthroplasty.   HISTORY OF PRESENT ILLNESS:  The patient is 69 year old female who has  been seen by Dr. Wynelle Link for ongoing left knee pain.  She has had a  previous knee replacement, and unfortunately has gone on to have a  loosening of the left total knee with collapse of the tibial component.  She had a previous right total knee which is doing well at this time.  The left knee is grossly loose with some instability.  She has reached a  point where she needs to have this done.  Risks and benefits have been  discussed.  She elects to proceed with surgery.  She has been seen  preoperatively by Dr. Kelton Pillar, felt to be no contraindications  from up and coming surgery.  If need be, the [facility] hospitalist will be  consulted to assist with medical management of the patient.   ALLERGIES:  NO KNOWN DRUG ALLERGIES.   CURRENT MEDICATIONS:  1. Glipizide XL 10 mg.  2. Neurontin 300 mg,  3. Actos 45 mg.  4. Metoprolol 100 mg.  5. Cardura 4 mg.  6. Lasix 40 mg.  7. Trental 400 mg.  8. Aspirin 81 mg.  9. Protonix 40 mg.  10.Zocor 20 mg.  11.Trazodone 50 mg.  12.Centrum Silver vitamin.  13.Vitamin D.  14.Vicodin.   PAST MEDICAL HISTORY:  1. Hypertension.  2. Hyperlipidemia.  3. Hiatal hernia.  4. Hemorrhoids.  5. Diverticulosis.  6. History of connective tissue disorder/lipodermatosclerosis.  7. History of renal calculi.  8. Non-insulin dependent diabetes mellitus.  9. Osteopenia.  10.Postmenopausal.  11.History of childhood illnesses of measles and mumps.  12.Chronic low back pain.  13.History of renal insufficiency.  14.History of anemia.  Go back up and then put connective tissue      disorders slash   PAST SURGICAL HISTORY:  1. Gallbladder surgery 1981.  2. Right knee scope 1984.  3. Left knee replacement 1999.  4. Right knee replacement 2009.   FAMILY HISTORY:  Father with lung cancer complications due to surgery.  Mother living age 73.   SOCIAL HISTORY:  Divorce, retired, past smoker, two packs a day for  about 2 years occasional intake of alcohol.  Lives alone.  She has two  children.  She does have a caregiver lined up.  She has five steps  entering her one level home.   REVIEW OF SYSTEMS:  GENERAL:  Occasional chills and night sweats.  No  fevers.  NEURO:  No seizures, syncope or paralysis.  RESPIRATORY:  No  shortness breath, productive cough or hemoptysis.  CARDIOVASCULAR:  No  chest pain, angina, orthopnea.  GI:  Intermittent  diarrhea and  constipation.  No nausea, vomiting.  GU:  No dysuria, hematuria or  discharge.  MUSCULOSKELETAL:  Left knee pain.   PHYSICAL EXAMINATION:  VITAL SIGNS:  Pulse 60, respirations 14, blood  pressure 124/82.  GENERAL:  A 70 year old white female well-nourished, well-developed,  markedly obese, alert and oriented, cooperative and pleasant.  HEENT:  Normocephalic, atraumatic.  Pupils round and reactive.  EOMs  intact.  NECK:  Supple.  CHEST:  Clear.  HEART:  Regular rate and rhythm with a faint murmur, S1 and S2 noted.  ABDOMEN:  Soft, large protuberant abdomen, bowel sounds present.  RECTAL/BREASTS/GENITALIA:  Not done, not pertinent to present illness.  EXTREMITIES:  Left knee significant varus malalignment deformity.  Range  of motion 0-115.  Tender more medial with valgus laxity.   IMPRESSION:  Loose left total knee arthroplasty.   PLAN:  The patient admitted to [facility] to undergo revision  left total knee arthroplasty.  Surgery will be performed by Gaynelle Arabian.  For medical physician, Dr.  Kelton Pillar with [facility]  physicians.  [facility] hospitalist will be consulted if needed for any  medical assistance with the patient during the hospital course.      Alexzandrew L. Perkins, P.A.C.      Gaynelle Arabian, M.D.  Electronically Signed    ALP/MEDQ  D:  01/08/2009  T:  01/09/2009  Job:  [id number]   cc:   [place] Cage. Laurann Montana, M.D.  Fax: [place] Rockwell Alexandria, M.D.  Fax: [phone]   Dr. Rolm Bookbinder

## 2011-03-19 LAB — URINALYSIS, ROUTINE W REFLEX MICROSCOPIC
Bilirubin Urine: NEGATIVE
Glucose, UA: NEGATIVE
Hgb urine dipstick: NEGATIVE
Ketones, ur: NEGATIVE
Nitrite: NEGATIVE
Protein, ur: NEGATIVE
Specific Gravity, Urine: 1.009
Urobilinogen, UA: 0.2
pH: 5.5

## 2011-03-19 LAB — CBC
HCT: 31.8 — ABNORMAL LOW
Hemoglobin: 10.8 — ABNORMAL LOW
MCHC: 34
MCV: 91.3
Platelets: 241
RBC: 3.48 — ABNORMAL LOW
RDW: 14.2
WBC: 6.6

## 2011-03-19 LAB — COMPREHENSIVE METABOLIC PANEL
ALT: 14
AST: 21
Albumin: 3.4 — ABNORMAL LOW
Alkaline Phosphatase: 76
BUN: 34 — ABNORMAL HIGH
CO2: 32
Calcium: 9.2
Chloride: 101
Creatinine, Ser: 1.22 — ABNORMAL HIGH
GFR calc Af Amer: 53 — ABNORMAL LOW
GFR calc non Af Amer: 44 — ABNORMAL LOW
Glucose, Bld: 170 — ABNORMAL HIGH
Potassium: 4.2
Sodium: 141
Total Bilirubin: 0.7
Total Protein: 6.5

## 2011-03-19 LAB — URINE MICROSCOPIC-ADD ON

## 2011-03-19 LAB — APTT: aPTT: 26

## 2011-03-19 LAB — PROTIME-INR
INR: 1
Prothrombin Time: 13.2

## 2011-03-20 LAB — CBC
HCT: 25.5 — ABNORMAL LOW
HCT: 27 — ABNORMAL LOW
HCT: 27 — ABNORMAL LOW
Hemoglobin: 8.7 — ABNORMAL LOW
Hemoglobin: 9.2 — ABNORMAL LOW
Hemoglobin: 9.2 — ABNORMAL LOW
MCHC: 34
MCHC: 34.1
MCHC: 34.2
MCV: 90.1
MCV: 90.4
MCV: 91.5
Platelets: 169
Platelets: 184
Platelets: 206
RBC: 2.79 — ABNORMAL LOW
RBC: 2.99 — ABNORMAL LOW
RBC: 2.99 — ABNORMAL LOW
RDW: 14.3
RDW: 14.4
RDW: 15
WBC: 7.4
WBC: 7.8
WBC: 8.1

## 2011-03-20 LAB — TYPE AND SCREEN
ABO/RH(D): A POS
Antibody Screen: NEGATIVE

## 2011-03-20 LAB — GLUCOSE, CAPILLARY
Glucose-Capillary: 110 — ABNORMAL HIGH
Glucose-Capillary: 129 — ABNORMAL HIGH
Glucose-Capillary: 132 — ABNORMAL HIGH
Glucose-Capillary: 134 — ABNORMAL HIGH
Glucose-Capillary: 135 — ABNORMAL HIGH
Glucose-Capillary: 144 — ABNORMAL HIGH
Glucose-Capillary: 147 — ABNORMAL HIGH
Glucose-Capillary: 150 — ABNORMAL HIGH
Glucose-Capillary: 151 — ABNORMAL HIGH
Glucose-Capillary: 159 — ABNORMAL HIGH
Glucose-Capillary: 160 — ABNORMAL HIGH
Glucose-Capillary: 171 — ABNORMAL HIGH
Glucose-Capillary: 172 — ABNORMAL HIGH
Glucose-Capillary: 176 — ABNORMAL HIGH
Glucose-Capillary: 180 — ABNORMAL HIGH
Glucose-Capillary: 181 — ABNORMAL HIGH
Glucose-Capillary: 185 — ABNORMAL HIGH
Glucose-Capillary: 216 — ABNORMAL HIGH
Glucose-Capillary: 88

## 2011-03-20 LAB — PREPARE RBC (CROSSMATCH)

## 2011-03-20 LAB — BASIC METABOLIC PANEL
BUN: 36 — ABNORMAL HIGH
BUN: 37 — ABNORMAL HIGH
BUN: 39 — ABNORMAL HIGH
CO2: 29
CO2: 29
CO2: 30
Calcium: 8.3 — ABNORMAL LOW
Calcium: 8.6
Calcium: 8.6
Chloride: 101
Chloride: 101
Chloride: 102
Creatinine, Ser: 1.37 — ABNORMAL HIGH
Creatinine, Ser: 1.51 — ABNORMAL HIGH
Creatinine, Ser: 1.67 — ABNORMAL HIGH
GFR calc Af Amer: 37 — ABNORMAL LOW
GFR calc Af Amer: 42 — ABNORMAL LOW
GFR calc Af Amer: 47 — ABNORMAL LOW
GFR calc non Af Amer: 31 — ABNORMAL LOW
GFR calc non Af Amer: 34 — ABNORMAL LOW
GFR calc non Af Amer: 39 — ABNORMAL LOW
Glucose, Bld: 161 — ABNORMAL HIGH
Glucose, Bld: 167 — ABNORMAL HIGH
Glucose, Bld: 183 — ABNORMAL HIGH
Potassium: 4.5
Potassium: 4.7
Potassium: 4.9
Sodium: 136
Sodium: 137
Sodium: 138

## 2011-03-20 LAB — PROTIME-INR
INR: 1
INR: 1.7 — ABNORMAL HIGH
INR: 2.1 — ABNORMAL HIGH
INR: 2.8 — ABNORMAL HIGH
INR: 4.1 — ABNORMAL HIGH
Prothrombin Time: 13.2
Prothrombin Time: 20.3 — ABNORMAL HIGH
Prothrombin Time: 25 — ABNORMAL HIGH
Prothrombin Time: 31.1 — ABNORMAL HIGH
Prothrombin Time: 43.3 — ABNORMAL HIGH

## 2011-03-20 LAB — ABO/RH: ABO/RH(D): A POS

## 2011-09-15 ENCOUNTER — Other Ambulatory Visit: Payer: Self-pay

## 2011-09-15 ENCOUNTER — Emergency Department (HOSPITAL_COMMUNITY): Payer: Medicare Other

## 2011-09-15 ENCOUNTER — Inpatient Hospital Stay (HOSPITAL_COMMUNITY)
Admission: EM | Admit: 2011-09-15 | Discharge: 2011-09-18 | DRG: 309 | Disposition: A | Discharge status: Home / Self Care | Payer: Medicare Other | Attending: Cardiology | Admitting: Cardiology

## 2011-09-15 ENCOUNTER — Encounter (HOSPITAL_COMMUNITY): Payer: Self-pay

## 2011-09-15 DIAGNOSIS — M3500 Sicca syndrome, unspecified: Secondary | ICD-10-CM | POA: Diagnosis present

## 2011-09-15 DIAGNOSIS — I10 Essential (primary) hypertension: Secondary | ICD-10-CM | POA: Diagnosis present

## 2011-09-15 DIAGNOSIS — D649 Anemia, unspecified: Secondary | ICD-10-CM | POA: Diagnosis present

## 2011-09-15 DIAGNOSIS — E119 Type 2 diabetes mellitus without complications: Secondary | ICD-10-CM | POA: Diagnosis present

## 2011-09-15 DIAGNOSIS — E669 Obesity, unspecified: Secondary | ICD-10-CM

## 2011-09-15 DIAGNOSIS — I498 Other specified cardiac arrhythmias: Principal | ICD-10-CM | POA: Diagnosis present

## 2011-09-15 DIAGNOSIS — Z6841 Body Mass Index (BMI) 40.0 and over, adult: Secondary | ICD-10-CM

## 2011-09-15 DIAGNOSIS — Z96659 Presence of unspecified artificial knee joint: Secondary | ICD-10-CM

## 2011-09-15 DIAGNOSIS — R55 Syncope and collapse: Secondary | ICD-10-CM | POA: Diagnosis present

## 2011-09-15 DIAGNOSIS — I4949 Other premature depolarization: Secondary | ICD-10-CM | POA: Diagnosis present

## 2011-09-15 DIAGNOSIS — E785 Hyperlipidemia, unspecified: Secondary | ICD-10-CM | POA: Diagnosis present

## 2011-09-15 DIAGNOSIS — R001 Bradycardia, unspecified: Secondary | ICD-10-CM

## 2011-09-15 HISTORY — DX: Hyperlipidemia, unspecified: E78.5

## 2011-09-15 HISTORY — DX: Sjogren syndrome, unspecified: M35.00

## 2011-09-15 HISTORY — DX: Essential (primary) hypertension: I10

## 2011-09-15 HISTORY — DX: Anemia, unspecified: D64.9

## 2011-09-15 HISTORY — DX: Obesity, unspecified: E66.9

## 2011-09-15 LAB — DIFFERENTIAL
Basophils Absolute: 0 10*3/uL (ref 0.0–0.1)
Basophils Absolute: 0 10*3/uL (ref 0.0–0.1)
Basophils Relative: 0 % (ref 0–1)
Basophils Relative: 0 % (ref 0–1)
Eosinophils Absolute: 0 10*3/uL (ref 0.0–0.7)
Eosinophils Absolute: 0 10*3/uL (ref 0.0–0.7)
Eosinophils Relative: 0 % (ref 0–5)
Eosinophils Relative: 0 % (ref 0–5)
Lymphocytes Relative: 10 % — ABNORMAL LOW (ref 12–46)
Lymphocytes Relative: 8 % — ABNORMAL LOW (ref 12–46)
Lymphs Abs: 0.7 10*3/uL (ref 0.7–4.0)
Lymphs Abs: 0.6 10*3/uL — ABNORMAL LOW (ref 0.7–4.0)
Monocytes Absolute: 0.4 10*3/uL (ref 0.1–1.0)
Monocytes Absolute: 0.6 10*3/uL (ref 0.1–1.0)
Monocytes Relative: 5 % (ref 3–12)
Monocytes Relative: 7 % (ref 3–12)
Neutro Abs: 6.2 10*3/uL (ref 1.7–7.7)
Neutro Abs: 6.6 10*3/uL (ref 1.7–7.7)
Neutrophils Relative %: 84 % — ABNORMAL HIGH (ref 43–77)
Neutrophils Relative %: 85 % — ABNORMAL HIGH (ref 43–77)

## 2011-09-15 LAB — COMPREHENSIVE METABOLIC PANEL
ALT: 50 U/L — ABNORMAL HIGH (ref 0–35)
AST: 71 U/L — ABNORMAL HIGH (ref 0–37)
Albumin: 3.2 g/dL — ABNORMAL LOW (ref 3.5–5.2)
Alkaline Phosphatase: 97 U/L (ref 39–117)
BUN: 44 mg/dL — ABNORMAL HIGH (ref 6–23)
CO2: 29 mEq/L (ref 19–32)
Calcium: 9.1 mg/dL (ref 8.4–10.5)
Chloride: 104 mEq/L (ref 96–112)
Creatinine, Ser: 1.5 mg/dL — ABNORMAL HIGH (ref 0.50–1.10)
GFR calc Af Amer: 40 mL/min — ABNORMAL LOW (ref >90)
GFR calc non Af Amer: 34 mL/min — ABNORMAL LOW (ref >90)
Glucose, Bld: 132 mg/dL — ABNORMAL HIGH (ref 70–99)
Potassium: 5 mEq/L (ref 3.5–5.1)
Sodium: 142 mEq/L (ref 135–145)
Total Bilirubin: 0.2 mg/dL — ABNORMAL LOW (ref 0.3–1.2)
Total Protein: 6.2 g/dL (ref 6.0–8.3)

## 2011-09-15 LAB — CBC
HCT: 29.5 % — ABNORMAL LOW (ref 36.0–46.0)
HCT: 28.6 % — ABNORMAL LOW (ref 36.0–46.0)
Hemoglobin: 9.2 g/dL — ABNORMAL LOW (ref 12.0–15.0)
Hemoglobin: 9.1 g/dL — ABNORMAL LOW (ref 12.0–15.0)
MCH: 29.1 pg (ref 26.0–34.0)
MCH: 29.5 pg (ref 26.0–34.0)
MCHC: 31.2 g/dL (ref 30.0–36.0)
MCHC: 31.8 g/dL (ref 30.0–36.0)
MCV: 93.4 fL (ref 78.0–100.0)
MCV: 92.9 fL (ref 78.0–100.0)
Platelets: 238 10*3/uL (ref 150–400)
Platelets: 227 10*3/uL (ref 150–400)
RBC: 3.16 MIL/uL — ABNORMAL LOW (ref 3.87–5.11)
RBC: 3.08 MIL/uL — ABNORMAL LOW (ref 3.87–5.11)
RDW: 15.2 % (ref 11.5–15.5)
RDW: 15.1 % (ref 11.5–15.5)
WBC: 7.3 10*3/uL (ref 4.0–10.5)
WBC: 7.8 10*3/uL (ref 4.0–10.5)

## 2011-09-15 LAB — PROTIME-INR
INR: 1.06 (ref 0.00–1.49)
INR: 1.05 (ref 0.00–1.49)
Prothrombin Time: 14 seconds (ref 11.6–15.2)
Prothrombin Time: 13.9 seconds (ref 11.6–15.2)

## 2011-09-15 LAB — POCT I-STAT, CHEM 8
BUN: 50 mg/dL — ABNORMAL HIGH (ref 6–23)
Calcium, Ion: 1.19 mmol/L (ref 1.12–1.32)
Chloride: 107 mEq/L (ref 96–112)
Creatinine, Ser: 1.7 mg/dL — ABNORMAL HIGH (ref 0.50–1.10)
Glucose, Bld: 244 mg/dL — ABNORMAL HIGH (ref 70–99)
HCT: 29 % — ABNORMAL LOW (ref 36.0–46.0)
Hemoglobin: 9.9 g/dL — ABNORMAL LOW (ref 12.0–15.0)
Potassium: 5.1 mEq/L (ref 3.5–5.1)
Sodium: 141 mEq/L (ref 135–145)
TCO2: 28 mmol/L (ref 0–100)

## 2011-09-15 LAB — POCT I-STAT TROPONIN I: Troponin i, poc: 0.01 ng/mL (ref 0.00–0.08)

## 2011-09-15 LAB — PHOSPHORUS: Phosphorus: 4.2 mg/dL (ref 2.3–4.6)

## 2011-09-15 LAB — APTT: aPTT: 28 seconds (ref 24–37)

## 2011-09-15 LAB — MAGNESIUM: Magnesium: 2.3 mg/dL (ref 1.5–2.5)

## 2011-09-15 LAB — TROPONIN I: Troponin I: <0.3 ng/mL (ref <0.30)

## 2011-09-15 MED ORDER — FUROSEMIDE 40 MG PO TABS
40.0000 mg | ORAL_TABLET | Freq: Every day | ORAL | Status: DC
  Administered 2011-09-16 – 2011-09-18 (×3): 40 mg via ORAL
  Filled 2011-09-15 (×3): qty 1

## 2011-09-15 MED ORDER — ATROPINE SULFATE 1 MG/ML IJ SOLN
1.0000 mg | Freq: Once | INTRAMUSCULAR | Status: AC
  Administered 2011-09-15: 1 mg via INTRAVENOUS

## 2011-09-15 MED ORDER — SODIUM CHLORIDE 0.9 % IV SOLN: 250.0000 mL | INTRAVENOUS | Status: DC | PRN

## 2011-09-15 MED ORDER — SIMVASTATIN 20 MG PO TABS
20.0000 mg | ORAL_TABLET | Freq: Every day | ORAL | Status: DC
  Administered 2011-09-17 (×2): 20 mg via ORAL
  Filled 2011-09-15 (×3): qty 1

## 2011-09-15 MED ORDER — PIOGLITAZONE HCL 45 MG PO TABS
45.0000 mg | ORAL_TABLET | Freq: Every day | ORAL | Status: DC
  Administered 2011-09-16 – 2011-09-18 (×3): 45 mg via ORAL
  Filled 2011-09-15 (×3): qty 1

## 2011-09-15 MED ORDER — SODIUM CHLORIDE 0.9 % IV BOLUS (SEPSIS)
1000.0000 mL | Freq: Once | INTRAVENOUS | Status: AC
  Administered 2011-09-15: 1000 mL via INTRAVENOUS

## 2011-09-15 MED ORDER — HEPARIN SODIUM (PORCINE) 5000 UNIT/ML IJ SOLN
5000.0000 [IU] | Freq: Three times a day (TID) | INTRAMUSCULAR | Status: DC
  Administered 2011-09-15: 5000 [IU] via SUBCUTANEOUS
  Filled 2011-09-15 (×5): qty 1

## 2011-09-15 MED ORDER — ADULT MULTIVITAMIN W/MINERALS CH
1.0000 | ORAL_TABLET | Freq: Every day | ORAL | Status: DC
  Administered 2011-09-16 – 2011-09-18 (×3): 1 via ORAL
  Filled 2011-09-15 (×3): qty 1

## 2011-09-15 MED ORDER — GABAPENTIN 300 MG PO CAPS
300.0000 mg | ORAL_CAPSULE | Freq: Every day | ORAL | Status: DC
  Administered 2011-09-16 – 2011-09-18 (×3): 300 mg via ORAL
  Filled 2011-09-15 (×3): qty 1

## 2011-09-15 MED ORDER — FENTANYL CITRATE 0.05 MG/ML IJ SOLN
INTRAMUSCULAR | Status: AC
  Filled 2011-09-15: qty 2

## 2011-09-15 MED ORDER — FENTANYL CITRATE 0.05 MG/ML IJ SOLN
50.0000 ug | Freq: Once | INTRAMUSCULAR | Status: AC
  Administered 2011-09-15: 50 ug via INTRAVENOUS
  Filled 2011-09-15: qty 2

## 2011-09-15 MED ORDER — FENTANYL CITRATE 0.05 MG/ML IJ SOLN
50.0000 ug | Freq: Once | INTRAMUSCULAR | Status: AC
  Administered 2011-09-15: 50 ug via INTRAVENOUS

## 2011-09-15 MED ORDER — SODIUM CHLORIDE 0.9 % IJ SOLN
3.0000 mL | Freq: Two times a day (BID) | INTRAMUSCULAR | Status: DC
  Administered 2011-09-15 – 2011-09-17 (×4): 3 mL via INTRAVENOUS

## 2011-09-15 MED ORDER — GLIPIZIDE ER 10 MG PO TB24
10.0000 mg | ORAL_TABLET | Freq: Two times a day (BID) | ORAL | Status: DC
  Administered 2011-09-16 – 2011-09-18 (×5): 10 mg via ORAL
  Filled 2011-09-15 (×7): qty 1

## 2011-09-15 MED ORDER — ONDANSETRON HCL 4 MG/2ML IJ SOLN
INTRAMUSCULAR | Status: AC
  Administered 2011-09-15: 20:00:00
  Filled 2011-09-15: qty 2

## 2011-09-15 MED ORDER — SODIUM CHLORIDE 0.9 % IJ SOLN: 3.0000 mL | INTRAMUSCULAR | Status: DC | PRN

## 2011-09-15 MED ORDER — VITAMIN D3 25 MCG (1000 UNIT) PO TABS
2000.0000 [IU] | ORAL_TABLET | Freq: Every day | ORAL | Status: DC
  Administered 2011-09-16 – 2011-09-17 (×2): 2000 [IU] via ORAL
  Filled 2011-09-15 (×3): qty 2

## 2011-09-15 MED ORDER — DOXAZOSIN MESYLATE 4 MG PO TABS
4.0000 mg | ORAL_TABLET | Freq: Every day | ORAL | Status: DC
  Administered 2011-09-16 – 2011-09-18 (×3): 4 mg via ORAL
  Filled 2011-09-15 (×3): qty 1

## 2011-09-15 MED ORDER — GABAPENTIN 300 MG PO CAPS: 300.0000 mg | ORAL_CAPSULE | Freq: Two times a day (BID) | ORAL | Status: DC

## 2011-09-15 MED ORDER — PANTOPRAZOLE SODIUM 40 MG PO TBEC
40.0000 mg | DELAYED_RELEASE_TABLET | Freq: Every day | ORAL | Status: DC
  Administered 2011-09-16 – 2011-09-18 (×3): 40 mg via ORAL
  Filled 2011-09-15 (×3): qty 1

## 2011-09-15 MED ORDER — SODIUM CHLORIDE 0.9 % IJ SOLN
3.0000 mL | Freq: Two times a day (BID) | INTRAMUSCULAR | Status: DC
  Administered 2011-09-17 – 2011-09-18 (×2): 3 mL via INTRAVENOUS

## 2011-09-15 MED ORDER — MONTELUKAST SODIUM 10 MG PO TABS
10.0000 mg | ORAL_TABLET | Freq: Every day | ORAL | Status: DC
  Administered 2011-09-17 (×2): 10 mg via ORAL
  Filled 2011-09-15 (×3): qty 1

## 2011-09-15 MED ORDER — GABAPENTIN 300 MG PO CAPS
600.0000 mg | ORAL_CAPSULE | Freq: Every day | ORAL | Status: DC
  Administered 2011-09-16 – 2011-09-17 (×3): 600 mg via ORAL
  Filled 2011-09-15 (×4): qty 2

## 2011-09-15 MED ORDER — ASPIRIN EC 81 MG PO TBEC
81.0000 mg | DELAYED_RELEASE_TABLET | Freq: Every day | ORAL | Status: DC
  Administered 2011-09-16 – 2011-09-18 (×3): 81 mg via ORAL
  Filled 2011-09-15 (×3): qty 1

## 2011-09-15 MED ORDER — PENTOXIFYLLINE ER 400 MG PO TBCR
400.0000 mg | EXTENDED_RELEASE_TABLET | Freq: Every day | ORAL | Status: DC
  Administered 2011-09-16 – 2011-09-18 (×3): 400 mg via ORAL
  Filled 2011-09-15 (×3): qty 1

## 2011-09-15 MED ORDER — ACETAMINOPHEN 325 MG PO TABS
650.0000 mg | ORAL_TABLET | Freq: Four times a day (QID) | ORAL | Status: DC | PRN
Start: 2011-09-15 — End: 2011-09-18
  Administered 2011-09-15 – 2011-09-18 (×6): 650 mg via ORAL
  Filled 2011-09-15 (×6): qty 2

## 2011-09-15 MED ORDER — ATROPINE SULFATE 0.1 MG/ML IJ SOLN
INTRAMUSCULAR | Status: AC
  Filled 2011-09-15: qty 10

## 2011-09-15 NOTE — H&P (Signed)
Admit date: 09/15/2011 Referring Physician  Dr. Marye Round Primary Physician  Dr. Kelton Pillar Chief complaint/reason for admission:  Syncope and bradycardia  HPI: This is a 71yo WM with a history of DM, HTN, dyslipidemia, obesity and Sjogren's disease who was in her USOH until around 6:30pm when she had a syncopal event.  She was sitting in a chair reading a brochure and all of a sudden got tunnel vision and hear a high sound in her head.  She became diaphoretic and nauseated and then slumped off of the chair. She tried to get up several times and blacked out.  She called her sister and EMS.  On arrival of EMS she was bradycardic with a junctional escape rhythm in the 20-30's.  Currently she feels fine except for back pain.   She denies any chest pain, SOB, palpitations.  She has occasional LE edema.  In ER she was given Atropine 65m without any significant effect and a Zohl was placed with external pacing.  Shortly after that she went back into NSR at 76bpm.    PMH:    Past Medical History  Diagnosis Date  . Diabetes mellitus   . Hypertension   . Obesity   . Sjogren's disease   . Hyperlipidemia   . Anemia     PSH:    Past Surgical History  Procedure Date  . Joint replacement     knee x 3  . Abdominal hysterectomy   . Colon resection     for rectovaginal fistula    ALLERGIES:   Norvasc; Glucophage; Lisinopril; Losartan; and Morphine and related  Prior to Admit Meds:   (Not in a hospital admission) Family HX:   History reviewed. No pertinent family history. Social HX:    History   Social History  . Marital Status: Divorced    Spouse Name: N/A    Number of Children: N/A  . Years of Education: N/A   Occupational History  . Not on file.   Social History Main Topics  . Smoking status: Former SResearch scientist (life sciences) . Smokeless tobacco: Not on file  . Alcohol Use: Yes  . Drug Use: No  . Sexually Active:    Other Topics Concern  . Not on file   Social History Narrative  . No  narrative on file     ROS:  All 11 ROS were addressed and are negative except what is stated in the HPI  PHYSICAL EXAM Filed Vitals:   09/15/11 2040  BP: 143/63  Resp: 17   General: Well developed, well nourished, in no acute distress Head: Eyes PERRLA, No xanthomas.   Normal cephalic and atramatic  Lungs:   Clear bilaterally to auscultation and percussion. Heart:   HRRR S1 S2 Pulses are 2+ & equal.            No carotid bruit. No JVD.  No abdominal bruits. No femoral bruits. Abdomen: Bowel sounds are positive, abdomen soft and non-tender without masses  Msk:  Back normal, normal gait. Normal strength and tone for age. Extremities:   No clubbing, cyanosis or edema.  DP +1 Neuro: Alert and oriented X 3. Psych:  Good affect, responds appropriately   Labs:   Lab Results  Component Value Date   WBC 7.3 09/15/2011   HGB 9.2* 09/15/2011   HCT 29.5* 09/15/2011   MCV 93.4 09/15/2011   PLT 238 09/15/2011      Lab Results  Component Value Date   INR 1.05 10/25/2009   INR  2.5* 02/01/2009   INR 3.1* 01/31/2009       Radiology:  RADIOLOGY REPORT*  Clinical Data: Syncope. Diabetes. Hypertension.  PORTABLE CHEST - 1 VIEW  Comparison: 10/09/2009  Findings: Cardiomegaly and pulmonary vascular congestion remain  stable. No evidence of acute infiltrate or edema. No evidence of  pleural effusion.  IMPRESSION:  Stable cardiomegaly pulmonary vascular congestion. No acute  findings.  Original Report Authenticated By: Marlaine Hind, M.D.   EKG:  #1Sinus arrest with ventricular escape rhythm            #2 NSR at 76bpm with RBBB   ASSESSMENT:  1.  Syncope secondary to severe bradycardia 2.  Severe bradycardia with sinus arrest and ventricular escape on Metoprolol 3.  HTN 4.  Obesity 5.  DM 6.  Sjogren's disease  PLAN:   Admit to telemetry bed Cycle cardiac enzymes Check TSH Stop beta blocker Check 2D echo in am EP consult in am   Sueanne Margarita, MD  09/15/2011  9:14  PM

## 2011-09-15 NOTE — ED Notes (Signed)
Cardiology MD at bedside.

## 2011-09-15 NOTE — ED Notes (Signed)
Family in [place]

## 2011-09-15 NOTE — ED Notes (Signed)
Pt placed on external pacer by Dr. Hillard Danker.

## 2011-09-15 NOTE — ED Notes (Signed)
Pt no longer being paced.

## 2011-09-15 NOTE — ED Provider Notes (Signed)
I saw and evaluated the patient, reviewed the resident's note and I agree with the findings and plan.   Patient presents to the emergency room with bradycardia. The patient had a syncopal episode at home and called 911. EMS found her heart rate to be in the 20s to 30s with a stable blood pressure.  Patient is complaining of some nausea but no chest pain.  On exam she has a blood pressure of 108/49 however heart rate was in the 20s to 30s. Lungs are clear to auscultation, cardiac bradycardic, extremities with slow but palpable pulses  Patient was given 1 mg of atropine without any significant change. Her symptoms and significant bradycardia she was placed on external pacer pads and external pacing was started. We are getting intermittent capture patient is remaining stable. We will check labs and consult cardiology.  9:06 PM HR is now in the 70s.  Pt is not requiring external pacing.  Will continue to monitor.      CRITICAL CARE Performed by: Kathalene Frames   Total critical care time: 30  Critical care time was exclusive of separately billable procedures and treating other patients.  Critical care was necessary to treat or prevent imminent or life-threatening deterioration.  Critical care was time spent personally by me on the following activities: development of treatment plan with patient and/or surrogate as well as nursing, discussions with consultants, evaluation of patient's response to treatment, examination of patient, obtaining history from patient or surrogate, ordering and performing treatments and interventions, ordering and review of laboratory studies, ordering and review of radiographic studies, pulse oximetry and re-evaluation of patient's condition.    Kathalene Frames, MD 09/15/11 2106

## 2011-09-15 NOTE — ED Provider Notes (Signed)
History     CSN: [id number]  Arrival date & time 09/15/11  2008   First MD Initiated Contact with Patient 09/15/11 2014      Chief Complaint  Patient presents with  . Bradycardia    (Consider location/radiation/quality/duration/timing/severity/associated sxs/prior treatment) Patient is a 71 y.o. female presenting with syncope. The history is provided by the patient and the EMS personnel.  Loss of Consciousness This is a new problem. The current episode started today. Episode frequency: multiple times. The problem has been rapidly improving. Associated symptoms include nausea and weakness (generalized). Pertinent negatives include no abdominal pain, chest pain, chills, congestion, fever, headaches or vomiting. The symptoms are aggravated by nothing. She has tried nothing for the symptoms.    Past Medical History  Diagnosis Date  . Diabetes mellitus   . Hypertension   . Obesity   . Sjogren's disease   . Hyperlipidemia   . Anemia     Past Surgical History  Procedure Date  . Joint replacement     knee x 3  . Abdominal hysterectomy   . Colon resection     for rectovaginal fistula    No family history on file.  History  Substance Use Topics  . Smoking status: Former Research scientist (life sciences)  . Smokeless tobacco: Not on file  . Alcohol Use: Yes    OB History    Grav Para Term Preterm Abortions TAB SAB Ect Mult Living                  Review of Systems  Constitutional: Negative for fever and chills.  HENT: Negative for congestion and rhinorrhea.   Respiratory: Negative for shortness of breath.   Cardiovascular: Positive for syncope. Negative for chest pain and leg swelling.  Gastrointestinal: Positive for nausea. Negative for vomiting, abdominal pain and constipation.  Genitourinary: Negative for urgency, decreased urine volume and difficulty urinating.  Skin: Negative for wound.  Neurological: Positive for weakness (generalized) and light-headedness. Negative for headaches.   Psychiatric/Behavioral: Negative for confusion.  All other systems reviewed and are negative.    Allergies  Norvasc; Glucophage; Lisinopril; Losartan; and Morphine and related  Home Medications   Current Outpatient Rx  Name Route Sig Dispense Refill  . ACETAMINOPHEN 500 MG PO TABS Oral Take 1,000 mg by mouth at bedtime.    . ASPIRIN EC 81 MG PO TBEC Oral Take 81 mg by mouth daily.    Marland Kitchen VITAMIN D 1000 UNITS PO TABS Oral Take 2,000 Units by mouth at bedtime.    Marland Kitchen DOXAZOSIN MESYLATE 4 MG PO TABS Oral Take 4 mg by mouth daily.    . FUROSEMIDE 40 MG PO TABS Oral Take 40 mg by mouth daily.    Marland Kitchen GABAPENTIN 300 MG PO CAPS Oral Take 300-600 mg by mouth 2 (two) times daily. Pt takes 1 capsule in the am & 2 capsules in the pm    . GLIPIZIDE ER 10 MG PO TB24 Oral Take 10 mg by mouth 2 (two) times daily.    Marland Kitchen METOPROLOL TARTRATE 100 MG PO TABS Oral Take 100 mg by mouth daily.    Marland Kitchen MONTELUKAST SODIUM 10 MG PO TABS Oral Take 10 mg by mouth at bedtime.     . ADULT MULTIVITAMIN W/MINERALS CH Oral Take 1 tablet by mouth daily.    Marland Kitchen PANTOPRAZOLE SODIUM 40 MG PO TBEC Oral Take 40 mg by mouth daily.    Marland Kitchen PENTOXIFYLLINE ER 400 MG PO TBCR Oral Take 400 mg by mouth daily.    Marland Kitchen  PIOGLITAZONE HCL 45 MG PO TABS Oral Take 45 mg by mouth daily.    Marland Kitchen SIMVASTATIN 20 MG PO TABS Oral Take 20 mg by mouth at bedtime.       BP 134/49  Pulse 65  Resp 16  SpO2 100%  Physical Exam  Nursing note and vitals reviewed. Constitutional: She is oriented to person, place, and time. She appears well-developed and well-nourished. No distress.  HENT:  Head: Normocephalic and atraumatic.  Right Ear: External ear normal.  Left Ear: External ear normal.  Nose: Nose normal.  Mouth/Throat: Oropharynx is clear and moist.  Neck: Neck supple.  Cardiovascular: Regular rhythm, normal heart sounds and intact distal pulses.  Bradycardia present.   Pulmonary/Chest: Effort normal and breath sounds normal. No respiratory distress. She  has no wheezes. She has no rales.  Abdominal: Soft. She exhibits no distension. There is no tenderness.  Musculoskeletal: She exhibits no edema.  Lymphadenopathy:    She has no cervical adenopathy.  Neurological: She is alert and oriented to person, place, and time.  Skin: Skin is warm and dry. She is not diaphoretic. No pallor.    ED Course  Procedures (including critical care time)  Labs Reviewed  CBC - Abnormal; Notable for the following:    RBC 3.16 (*)    Hemoglobin 9.2 (*)    HCT 29.5 (*)    All other components within normal limits  DIFFERENTIAL - Abnormal; Notable for the following:    Neutrophils Relative 84 (*)    Lymphocytes Relative 10 (*)    All other components within normal limits  TROPONIN I  PROTIME-INR   Dg Chest Portable 1 View  09/15/2011  *RADIOLOGY REPORT*  Clinical Data: Syncope.  Diabetes.  Hypertension.  PORTABLE CHEST - 1 VIEW  Comparison: 10/09/2009  Findings: Cardiomegaly and pulmonary vascular congestion remain stable.  No evidence of acute infiltrate or edema.  No evidence of pleural effusion.  IMPRESSION: Stable cardiomegaly pulmonary vascular congestion.  No acute findings.  Original Report Authenticated By: Marlaine Hind, M.D.    Date: 09/15/2011  Rate: 31  Rhythm: junctional bradycardia  QRS Axis: indeterminate  Intervals: QT prolonged  ST/T Wave abnormalities: normal  Conduction Disutrbances:junctional rhythm  Narrative Interpretation:   Old EKG Reviewed: none available    1. Bradycardia   2. Syncope       MDM  71 yo female with acute syncope multiple times today. Bradycardic on arrival, down to 18. Never lost a palpable radial pulse, and lowest BP 90 systolic. However patient very lightheaded and dizzy. Atropine given without effect, so patient transcutaneously paced with good capture. Given fentanyl during this time. During pacing patient's mental status improved and became less symptomatic. Patient's BP rose as well into the 140s.  Cardiology consulted, and prior to their arrival patient spontaneously converted to a normal sinus rhythm in the 70s. Pacing stopped and patient remained asymptomatic. Patient admitted to cardiology after receiving IVF in ED and will be watched by cardiology.        Sherwood Gambler, MD 09/15/11 2312

## 2011-09-15 NOTE — ED Notes (Signed)
Family at bedside

## 2011-09-15 NOTE — ED Notes (Signed)
Per ems- pt felt very faint at home, slid to floor, pt states she did black out "several times." Upon EMS arrival pt found to have HR in 20-30s. BP stable. 20g IV in left hand. Pt did feel nauseous and was given 78m zofran. Pt has had 300cc of fluid. Pt a&ox4 for EMS. Pt stayed alert during transport. Pt currenlty alert and oriented.

## 2011-09-16 ENCOUNTER — Encounter (HOSPITAL_COMMUNITY): Payer: Self-pay | Admitting: Infectious Diseases

## 2011-09-16 ENCOUNTER — Other Ambulatory Visit: Payer: Self-pay

## 2011-09-16 DIAGNOSIS — R55 Syncope and collapse: Secondary | ICD-10-CM

## 2011-09-16 DIAGNOSIS — I059 Rheumatic mitral valve disease, unspecified: Secondary | ICD-10-CM

## 2011-09-16 DIAGNOSIS — I498 Other specified cardiac arrhythmias: Principal | ICD-10-CM

## 2011-09-16 LAB — BASIC METABOLIC PANEL
BUN: 46 mg/dL — ABNORMAL HIGH (ref 6–23)
CO2: 28 mEq/L (ref 19–32)
Calcium: 8.8 mg/dL (ref 8.4–10.5)
Chloride: 103 mEq/L (ref 96–112)
Creatinine, Ser: 1.43 mg/dL — ABNORMAL HIGH (ref 0.50–1.10)
GFR calc Af Amer: 42 mL/min — ABNORMAL LOW (ref >90)
GFR calc non Af Amer: 36 mL/min — ABNORMAL LOW (ref >90)
Glucose, Bld: 62 mg/dL — ABNORMAL LOW (ref 70–99)
Potassium: 4.3 mEq/L (ref 3.5–5.1)
Sodium: 140 mEq/L (ref 135–145)

## 2011-09-16 LAB — TSH: TSH: 6.035 u[IU]/mL — ABNORMAL HIGH (ref 0.350–4.500)

## 2011-09-16 LAB — GLUCOSE, CAPILLARY
Glucose-Capillary: 60 mg/dL — ABNORMAL LOW (ref 70–99)
Glucose-Capillary: 90 mg/dL (ref 70–99)
Glucose-Capillary: 92 mg/dL (ref 70–99)
Glucose-Capillary: 93 mg/dL (ref 70–99)

## 2011-09-16 LAB — CARDIAC PANEL(CRET KIN+CKTOT+MB+TROPI)
CK, MB: 2.3 ng/mL (ref 0.3–4.0)
CK, MB: 2.2 ng/mL (ref 0.3–4.0)
CK, MB: 2.3 ng/mL (ref 0.3–4.0)
Relative Index: INVALID (ref 0.0–2.5)
Relative Index: INVALID (ref 0.0–2.5)
Relative Index: INVALID (ref 0.0–2.5)
Total CK: 70 U/L (ref 7–177)
Total CK: 69 U/L (ref 7–177)
Total CK: 70 U/L (ref 7–177)
Troponin I: <0.3 ng/mL (ref <0.30)
Troponin I: <0.3 ng/mL (ref <0.30)
Troponin I: <0.3 ng/mL (ref <0.30)

## 2011-09-16 LAB — HEMOGLOBIN A1C
Hgb A1c MFr Bld: 7.8 % — ABNORMAL HIGH (ref <5.7)
Mean Plasma Glucose: 177 mg/dL — ABNORMAL HIGH (ref <117)

## 2011-09-16 LAB — MRSA PCR SCREENING: MRSA by PCR: NEGATIVE

## 2011-09-16 MED ORDER — INSULIN ASPART 100 UNIT/ML ~~LOC~~ SOLN
0.0000 [IU] | Freq: Three times a day (TID) | SUBCUTANEOUS | Status: DC
  Administered 2011-09-17: 2 [IU] via SUBCUTANEOUS
  Administered 2011-09-17: 3 [IU] via SUBCUTANEOUS
  Administered 2011-09-18: 2 [IU] via SUBCUTANEOUS

## 2011-09-16 NOTE — Progress Notes (Signed)
SUBJECTIVE:  Feels better.  Still tired.  Is not wreconsidering pacer.  SHe does not want to have another episode like yesterday.  She thinks maybe she will have the pacer place to prevent another episode.  She is concerned whether insurance will cover.  OBJECTIVE:   Vitals:   Filed Vitals:   09/16/11 1600 09/16/11 1626 09/16/11 1700 09/16/11 1800  BP: 101/49  95/41 124/39  Pulse: 59  53 65  Temp:  97.4 F (36.3 C)    TempSrc:  Oral    Resp: _0 Height:      Weight:      SpO2: 97%  96% 99%   I&O's:   Intake/Output Summary (Last 24 hours) at 09/16/11 1818 Last data filed at 09/16/11 1800  Gross per 24 hour  Intake    720 ml  Output    700 ml  Net     20 ml   TELEMETRY: Reviewed telemetry pt in NSR:     PHYSICAL EXAM General: Well developed, well nourished, in no acute distress Head:    Normal cephalic and atramatic  Lungs:   Clear bilaterally to auscultation. Heart:   HRRR S1 S2 Abdomen: Bowel sounds are positive, abdomen soft and non-tender without masses or                  Hernia's noted. Msk:  Back normal, . Normal strength and tone for age. Extremities: traceedema.  DP +1 Neuro: Alert and oriented X 3. Psych:  Good affect, responds appropriately   LABS: Basic Metabolic Panel:  Basename 09/16/11 0640 09/15/11 2302  NA 140 142  K 4.3 5.0  CL 103 104  CO2 28 29  GLUCOSE 62* 132*  BUN 46* 44*  CREATININE 1.43* 1.50*  CALCIUM 8.8 9.1  MG -- 2.3  PHOS -- 4.2   Liver Function Tests:  [facility] 09/15/11 2302  AST 71*  ALT 50*  ALKPHOS 97  BILITOT 0.2*  PROT 6.2  ALBUMIN 3.2*   No results found for this basename: LIPASE:2,AMYLASE:2 in the last 72 hours CBC:  Basename 09/15/11 2302 09/15/11 2127 09/15/11 2037  WBC 7.8 -- 7.3  NEUTROABS 6.6 -- 6.2  HGB 9.1* 9.9* --  HCT 28.6* 29.0* --  MCV 92.9 -- 93.4  PLT 227 -- 238   Cardiac Enzymes:  Basename 09/16/11 1546 09/16/11 0640 09/15/11 2301  CKTOTAL 70 69 70  CKMB 2.3 2.2 2.3  CKMBINDEX  -- -- --  TROPONINI <0.30 <0.30 <0.30   BNP: No components found with this basename: POCBNP:3 D-Dimer: No results found for this basename: DDIMER:2 in the last 72 hours Hemoglobin A1C:  Basename 09/15/11 2302  HGBA1C 7.8*   Fasting Lipid Panel: No results found for this basename: CHOL,HDL,LDLCALC,TRIG,CHOLHDL,LDLDIRECT in the last 72 hours Thyroid Function Tests:  Basename 09/15/11 2302  TSH 6.035*  T4TOTAL --  T3FREE --  THYROIDAB --   Anemia Panel: No results found for this basename: VITAMINB12,FOLATE,FERRITIN,TIBC,IRON,RETICCTPCT in the last 72 hours Coag Panel:   Lab Results  Component Value Date   INR 1.05 09/15/2011   INR 1.06 09/15/2011   INR 1.05 10/25/2009    RADIOLOGY: Dg Chest Portable 1 View  09/15/2011  *RADIOLOGY REPORT*  Clinical Data: Syncope.  Diabetes.  Hypertension.  PORTABLE CHEST - 1 VIEW  Comparison: 10/09/2009  Findings: Cardiomegaly and pulmonary vascular congestion remain stable.  No evidence of acute infiltrate or edema.  No evidence of pleural effusion.  IMPRESSION: Stable cardiomegaly pulmonary vascular congestion.  No acute findings.  Original Report Authenticated By: Marlaine Hind, M.D.      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  PLAN:  Will make NPO past MN for possible pacer.  If rhythm is stable and she decides to hold off, will need a monitor at the time of d/c.  Hold rate slowing drugs.  She is still considering her options.  Jettie Booze., MD  09/16/2011  6:18 PM

## 2011-09-16 NOTE — Consult Note (Signed)
ELECTROPHYSIOLOGY CONSULT NOTE    Patient ID: Tricia Gonzales MRN: [id number], DOB/AGE: [birth year] 71 y.o.  Admit date: 09/15/2011 Date of Consult: 09-16-2011  Primary Physician: Kelton Pillar, MD Primary Cardiologist: Peter Martinique, MD (remotely)  Reason for Consultation: syncope and bradycardia  HPI:  Tricia Gonzales is a 71 year old female with a history of morbid obesity, diabetes, hypertension, obesity, Sjogren's disease, and hyperlipidemia. She reports that last night, she was sitting in a chair reading a pamphlet when she began to feel "woozy" headed and things started "blacking out".  She states that she then felt herself sliding to the floor and lost consciousness.  She attempted to get to the kitchen and she passed out again.  She was able to get to the telephone and call her sister and then 911.  When EMS arrived, she was in junctional rhythm in the 20-30's.  She was brought to the ER for further evaluation.  In the ER, she was given Atropine and externally paced for a short while and then she went into sinus rhythm.  She is on Metoprolol for hypertension and took her last dose yesterday morning (146m).  She states that she has been on this medication long term for hypertension.  Looking back, she feels as though she has had some "woozy" head feelings off and on for a few months.  These usually occur when she has been reading or at the computer for a prolonged period of time.  She has had syncope before, once while pregnant, and once in the setting of emotional stress while signing her divorce papers.    She also endorses increased shortness of breath and exercise intolerance over the past few months.  She states she has had lower extremity edema for a long time, and this has not worsened lately.  There has been no chest pain.  She complains of headaches since the fall.   Her hemoglobin was 9.1 on admission.  She states this is chronic for her.  She has been evaluated by hematology and  GI with no reason found per her report.    Creatinine was 1.43 on admission, was 1.22 in September of 2011.    Past Medical History  Diagnosis Date  . Diabetes mellitus   . Hypertension   . Obesity   . Sjogren's disease   . Hyperlipidemia   . Anemia      Surgical History:  Past Surgical History  Procedure Date  . Joint replacement     knee x 3  . Abdominal hysterectomy   . Colon resection     for rectovaginal fistula     Prescriptions prior to admission  Medication Sig Dispense Refill  . acetaminophen (TYLENOL) 500 MG tablet Take 1,000 mg by mouth at bedtime.      .Marland Kitchenaspirin EC 81 MG tablet Take 81 mg by mouth daily.      . cholecalciferol (VITAMIN D) 1000 UNITS tablet Take 2,000 Units by mouth at bedtime.      .Marland Kitchendoxazosin (CARDURA) 4 MG tablet Take 4 mg by mouth daily.      . furosemide (LASIX) 40 MG tablet Take 40 mg by mouth daily.      .Marland Kitchengabapentin (NEURONTIN) 300 MG capsule Take 300-600 mg by mouth 2 (two) times daily. Pt takes 1 capsule in the am & 2 capsules in the pm      . glipiZIDE (GLUCOTROL XL) 10 MG 24 hr tablet Take 10 mg by mouth 2 (two)  times daily.      . metoprolol (LOPRESSOR) 100 MG tablet Take 100 mg by mouth daily.      . montelukast (SINGULAIR) 10 MG tablet Take 10 mg by mouth at bedtime.       . Multiple Vitamin (MULITIVITAMIN WITH MINERALS) TABS Take 1 tablet by mouth daily.      . pantoprazole (PROTONIX) 40 MG tablet Take 40 mg by mouth daily.      . pentoxifylline (TRENTAL) 400 MG CR tablet Take 400 mg by mouth daily.      . pioglitazone (ACTOS) 45 MG tablet Take 45 mg by mouth daily.      . simvastatin (ZOCOR) 20 MG tablet Take 20 mg by mouth at bedtime.         Inpatient Medications:    . aspirin EC  81 mg Oral Daily  . cholecalciferol  2,000 Units Oral QHS  . doxazosin  4 mg Oral Daily  . furosemide  40 mg Oral Daily  . gabapentin  300 mg Oral Daily  . gabapentin  600 mg Oral QHS  . glipiZIDE  10 mg Oral BID AC  . heparin  5,000 Units  Subcutaneous Q8H  . montelukast  10 mg Oral QHS  . mulitivitamin with minerals  1 tablet Oral Daily  . ondansetron      . pantoprazole  40 mg Oral Daily  . pentoxifylline  400 mg Oral Daily  . pioglitazone  45 mg Oral Daily  . simvastatin  20 mg Oral QHS    Allergies:  Allergies  Allergen Reactions  . Norvasc (Amlodipine Besylate) Shortness Of Breath  . Glucophage (Metformin Hydrochloride) Other (See Comments)    "increases creatinine"  . Lisinopril     Headache   . Losartan     Headache   . Morphine And Related Other (See Comments)    "hallucinations"    History   Social History  . Marital Status: Divorced    Spouse Name: N/A    Number of Children: 2  . Years of Education: N/A   Occupational History  . Retired, was Electrical engineer for [employer]   Social History Main Topics  . Smoking status: Former Research scientist (life sciences)  . Smokeless tobacco: Not on file  . Alcohol Use: Yes  . Drug Use: No  . Sexually Active: Not Currently    History reviewed. No pertinent family history.   Physical Exam: Filed Vitals:   09/16/11 0600 09/16/11 0700 09/16/11 0715 09/16/11 0804  BP: 97/28 88/39 105/44   Pulse: 55 61 52   Temp:    97.4 F (36.3 C)  TempSrc:    Oral  Resp: _0 Height:      Weight:      SpO2: 97% 96% 98%     GEN- The patient is morbidly obese appearing, alert and oriented x 3 today.   Head- normocephalic, atraumatic Eyes-  Sclera clear, conjunctiva pink Ears- hearing intact Oropharynx- clear Neck- supple, no JVP Lymph- no cervical lymphadenopathy Lungs- Clear to ausculation bilaterally, normal work of breathing Heart- Regular rate and rhythm, no murmurs, rubs or gallops, PMI not laterally displaced GI- soft, NT, ND, + BS Extremities- no clubbing, cyanosis, 1+ edema MS- no significant deformity or atrophy Skin- no rash or lesion Psych- euthymic mood, full affect Neuro- strength and sensation are intact  Labs:   Lab Results  Component Value  Date   WBC 7.8 09/15/2011   HGB 9.1* 09/15/2011   HCT 28.6*  09/15/2011   MCV 92.9 09/15/2011   PLT 227 09/15/2011    Lab 09/16/11 0640 09/15/11 2302  NA 140 --  K 4.3 --  CL 103 --  CO2 28 --  BUN 46* --  CREATININE 1.43* --  CALCIUM 8.8 --  PROT -- 6.2  BILITOT -- 0.2*  ALKPHOS -- 97  ALT -- 50*  AST -- 71*  GLUCOSE 62* --   Lab Results  Component Value Date   CKTOTAL 69 09/16/2011   CKMB 2.2 09/16/2011   TROPONINI <0.30 09/16/2011    Magnesium- 2.3, TSH- pending  Radiology/Studies: Dg Chest Portable 1 View 09/15/2011  *RADIOLOGY REPORT*  Clinical Data: Syncope.  Diabetes.  Hypertension.  PORTABLE CHEST - 1 VIEW  Comparison: 10/09/2009  Findings: Cardiomegaly and pulmonary vascular congestion remain stable.  No evidence of acute infiltrate or edema.  No evidence of pleural effusion.  IMPRESSION: Stable cardiomegaly pulmonary vascular congestion.  No acute findings.  Original Report Authenticated By: Marlaine Hind, M.D.   EKG: 09-16-2011- junctional rhythm with  RBBB ventricular escape, rate 47  TELEMETRY: junctional rhythm/sinus rhythm presently 50s  A/P:  The patient has symptomatic bradycardia with syncope.  Review of her rhythm strip from EMS reveals a slow ventricular escape rhythm in the 20s/30s.  Presently, she is in sinus rhythm 50s-60s.  She has been on metoprolol 152m daily chronically with her last dose yesterday am.  I suspect that she will require PPM longterm. At this time, however, she is clear that she would like to allow metoprolol to wash out and then proceed with PPM only if her bradycardia returns.  I think that this is a reasonable option. We will follow her closely on telemetry.  If she has further pauses or return of symptomatic bradycardia then we will proceed with PPM.  She is aware that following syncope, she cannot drive for 6 months.  Mackenize Delgadillo,MD 10:12 AM 09/16/2011

## 2011-09-16 NOTE — Progress Notes (Signed)
Inpatient Diabetes Program Recommendations  AACE/ADA: New Consensus Statement on Inpatient Glycemic Control (2009)  Target Ranges:  Prepandial:   less than 140 mg/dL      Peak postprandial:   less than 180 mg/dL (1-2 hours)      Critically ill patients:  140 - 180 mg/dL   Reason for Visit: Results for ROMELLE, MULDOON (MRN [id number]) as of 09/16/2011 10:59  Ref. Range 09/15/2011 21:13 09/15/2011 21:27 09/15/2011 23:01 09/15/2011 23:02 09/16/2011 06:40  Glucose Latest Range: 70-99 mg/dL  244 (H)  132 (H) 62 (L)   Please check CBG's tid wc and HS due to history of diabetes. Lab glucose slightly low this morning.  Inpatient Diabetes Program Recommendations Correction (SSI): Consider starting Novolog sensitive tid wc and HS while patient is in the hospital. Oral Agents: Please hold Glipizide while patient is NPO. HgbA1C: Ordered.  Note: Will follow.

## 2011-09-16 NOTE — Progress Notes (Signed)
  Echocardiogram 2D Echocardiogram has been performed.  Tricia Gonzales A 09/16/2011, 9:56 AM

## 2011-09-16 NOTE — Progress Notes (Signed)
All charting, assessments and medication administration from 2242 3/26 through 0730 3/27 for SN has been supervised and verified to be correct by this RN.

## 2011-09-17 LAB — GLUCOSE, CAPILLARY
Glucose-Capillary: 87 mg/dL (ref 70–99)
Glucose-Capillary: 162 mg/dL — ABNORMAL HIGH (ref 70–99)
Glucose-Capillary: 244 mg/dL — ABNORMAL HIGH (ref 70–99)
Glucose-Capillary: 105 mg/dL — ABNORMAL HIGH (ref 70–99)

## 2011-09-17 LAB — T4, FREE: Free T4: 1.22 ng/dL (ref 0.80–1.80)

## 2011-09-17 NOTE — Progress Notes (Signed)
Patient taken to 2040 by wheelchair. On RA 02 sats 92% and above. Patient tolerated well, VSS. RN and tech at bedside for admit. No questions from RN at this time. Family contact information given and patient not wanting to call them at this time. Patient states that she will call her son in the morning.

## 2011-09-17 NOTE — Progress Notes (Signed)
Charting done by SN, Gilford Rile, has been verified by myself concerning Ms. Claggett's care for today.  This includes, medications, assessment, and hourly charting.    Doran Clay, RN

## 2011-09-17 NOTE — Discharge Instructions (Addendum)
[facility]:  [facility] ([facility] [address] [place], [place] [address]  [phone]  Personal Trainers available.

## 2011-09-17 NOTE — Progress Notes (Signed)
CARE MANAGEMENT NOTE 09/17/2011  Patient:  Tricia Gonzales, Tricia Gonzales   Account Number:  [id number]  Date Initiated:  09/17/2011  Documentation initiated by:  GRAVES-BIGELOW,Joseff Luckman  Subjective/Objective Assessment:   Pt admitted with Syncope and bradycardia. Plan for d/c 09-18-11. CM received referral for weight loss assistance. Pt is from home alone and she has support of daughter and son. She has two sisters that live in [place].     Action/Plan:   CM did give pt several options for resources that she could visit. She stated that the [facility] will be the best option for her. CM will place the information on the AVS form.   Anticipated DC Date:  09/18/2011   Anticipated DC Plan:  [place]  CM consult      Choice offered to / List presented to:             Status of service:  Completed, signed off Medicare Important Message given?   (If response is "NO", the following Medicare IM given date fields will be blank) Date Medicare IM given:   Date Additional Medicare IM given:    Discharge Disposition:  HOME/SELF CARE  Per UR Regulation:    If discussed at Long Length of Stay Meetings, dates discussed:    Comments:  09-16-11 1449 Jacqlyn Krauss, RN,BSN [phone] CM did discuss the option of a RN to visit. Pt is refusing as of now. CM read in MD notes that pt will not be able to drive for 6 months. She feels that this is not realisitic. Pt's daughter lives in [place] and  she will not be able to provide transportation for 6 months. CM stated that an option would be to contact neighbors / friends and that they may can help her run errands. Pt states she has to get her hair and nails done. She stated that she does have sisters that may can assist, but 6 months is too much for anybody. Pt states she has [employer] security system. CM suggessted that she call [employer] and they can send her a wristlet with the ermergency button on it and if anything was to happen  she could press that button for assistance. No other needs for CM at this time. Will continue to monitor for additional d/c needs.

## 2011-09-17 NOTE — Progress Notes (Addendum)
SUBJECTIVE: The patient is doing well today.  At this time, she denies chest pain, shortness of breath, or any new concerns. No further presyncope or syncope    . aspirin EC  81 mg Oral Daily  . cholecalciferol  2,000 Units Oral QHS  . doxazosin  4 mg Oral Daily  . furosemide  40 mg Oral Daily  . gabapentin  300 mg Oral Daily  . gabapentin  600 mg Oral QHS  . glipiZIDE  10 mg Oral BID AC  . insulin aspart  0-9 Units Subcutaneous TID WC  . montelukast  10 mg Oral QHS  . mulitivitamin with minerals  1 tablet Oral Daily  . pantoprazole  40 mg Oral Daily  . pentoxifylline  400 mg Oral Daily  . pioglitazone  45 mg Oral Daily  . simvastatin  20 mg Oral QHS  . sodium chloride  3 mL Intravenous Q12H  . sodium chloride  3 mL Intravenous Q12H  . DISCONTD: heparin  5,000 Units Subcutaneous Q8H      OBJECTIVE: Physical Exam: Filed Vitals:   09/17/11 0400 09/17/11 0500 09/17/11 0600 09/17/11 0700  BP: 123/47 124/60 113/35 124/43  Pulse: 66 74 67 67  Temp: 98.2 F (36.8 C)   98.3 F (36.8 C)  TempSrc: Oral   Oral  Resp: _0 Height:      Weight:  290 lb (131.543 kg)    SpO2: 96% 94% 95% 96%    Intake/Output Summary (Last 24 hours) at 09/17/11 3875 Last data filed at 09/17/11 0500  Gross per 24 hour  Intake    950 ml  Output   1750 ml  Net   -800 ml    Telemetry reveals sinus rhythm, when asleep she is brady 50s with junctional rhythm, when awake heart rates are 70s  GEN- The patient is overweight appearing, alert and oriented x 3 today.   Head- normocephalic, atraumatic Eyes-  Sclera clear, conjunctiva pink Ears- hearing intact Oropharynx- clear Neck- supple, no JVP Lymph- no cervical lymphadenopathy Lungs- Clear to ausculation bilaterally, normal work of breathing Heart- Regular rate and rhythm, no murmurs, rubs or gallops, PMI not laterally displaced GI- soft, NT, ND, + BS Extremities- no clubbing, cyanosis, or edema  LABS: Basic Metabolic  Panel:  Basename 09/16/11 0640 09/15/11 2302  NA 140 142  K 4.3 5.0  CL 103 104  CO2 28 29  GLUCOSE 62* 132*  BUN 46* 44*  CREATININE 1.43* 1.50*  CALCIUM 8.8 9.1  MG -- 2.3  PHOS -- 4.2   Liver Function Tests:  Basename 09/15/11 2302  AST 71*  ALT 50*  ALKPHOS 97  BILITOT 0.2*  PROT 6.2  ALBUMIN 3.2*   No results found for this basename: LIPASE:2,AMYLASE:2 in the last 72 hours CBC:  Basename 09/15/11 2302 09/15/11 2127 09/15/11 2037  WBC 7.8 -- 7.3  NEUTROABS 6.6 -- 6.2  HGB 9.1* 9.9* --  HCT 28.6* 29.0* --  MCV 92.9 -- 93.4  PLT 227 -- 238   Cardiac Enzymes:  Basename 09/16/11 1546 09/16/11 0640 09/15/11 2301  CKTOTAL 70 69 70  CKMB 2.3 2.2 2.3  CKMBINDEX -- -- --  TROPONINI <0.30 <0.30 <0.30   BNP: No components found with this basename: POCBNP:3 D-Dimer: No results found for this basename: DDIMER:2 in the last 72 hours Hemoglobin A1C:  Basename 09/15/11 2302  HGBA1C 7.8*   Fasting Lipid Panel: No results found for this basename: CHOL,HDL,LDLCALC,TRIG,CHOLHDL,LDLDIRECT in the last 72 hours Thyroid  Function Tests:  Basename 09/15/11 2302  TSH 6.035*  T4TOTAL --  T3FREE --  THYROIDAB --   Anemia Panel: No results found for this basename: VITAMINB12,FOLATE,FERRITIN,TIBC,IRON,RETICCTPCT in the last 72 hours  RADIOLOGY: Dg Chest Portable 1 View  09/15/2011  *RADIOLOGY REPORT*  Clinical Data: Syncope.  Diabetes.  Hypertension.  PORTABLE CHEST - 1 VIEW  Comparison: 10/09/2009  Findings: Cardiomegaly and pulmonary vascular congestion remain stable.  No evidence of acute infiltrate or edema.  No evidence of pleural effusion.  IMPRESSION: Stable cardiomegaly pulmonary vascular congestion.  No acute findings.  Original Report Authenticated By: Marlaine Hind, M.D.    ASSESSMENT AND PLAN:  Principal Problem:  *Syncope Active Problems:  Bradycardia, severe sinus  1.  Bradycardia- improving off of metoprolol Reversible causes for syncope/ brady include  metoprolol and thyroid dysfunction. Hold metoprolol long  term TSH is high,  Will check T4 and consider replacement.  I will defer management of thyroid to Dr Radford Pax  2. Elevated TSH as above  3. Syncope- could go to telemetry today Plan to DC tomorrow with an event monitor if rhythm is stable No driving x 6 months (pt is aware)  If no further issues, I will see as needed while here. Please call with questions.   Thompson Grayer, MD 09/17/2011 8:07 AM

## 2011-09-17 NOTE — Progress Notes (Signed)
SUBJECTIVE:  Doing well  OBJECTIVE:   Vitals:   Filed Vitals:   09/17/11 1300 09/17/11 1400 09/17/11 1500 09/17/11 1600  BP: 107/49 93/80 114/45 126/54  Pulse: 86 79 80 77  Temp:    98.4 F (36.9 C)  TempSrc:    Oral  Resp: _0 Height:      Weight:      SpO2: 97% 96% 95% 95%   I&O's:   Intake/Output Summary (Last 24 hours) at 09/17/11 1754 Last data filed at 09/17/11 1600  Gross per 24 hour  Intake   1190 ml  Output   1650 ml  Net   -460 ml   TELEMETRY: Reviewed telemetry pt in NSR     PHYSICAL EXAM General: Well developed, well nourished, in no acute distress Head: Eyes PERRLA, No xanthomas.   Normal cephalic and atramatic  Lungs:  Clear bilaterally to auscultation and percussion. Heart:  HRRR S1 S2 Pulses are 2+ & equal.            No carotid bruit. No JVD.  No abdominal bruits. No femoral bruits. Abdomen: Bowel sounds are positive, abdomen soft and non-tender without masses Neuro: Alert and oriented X 3. Psych:  Good affect, responds appropriately   LABS: Basic Metabolic Panel:  Basename 09/16/11 0640 09/15/11 2302  NA 140 142  K 4.3 5.0  CL 103 104  CO2 28 29  GLUCOSE 62* 132*  BUN 46* 44*  CREATININE 1.43* 1.50*  CALCIUM 8.8 9.1  MG -- 2.3  PHOS -- 4.2   Liver Function Tests:  [facility] 09/15/11 2302  AST 71*  ALT 50*  ALKPHOS 97  BILITOT 0.2*  PROT 6.2  ALBUMIN 3.2*   No results found for this basename: LIPASE:2,AMYLASE:2 in the last 72 hours CBC:  Basename 09/15/11 2302 09/15/11 2127 09/15/11 2037  WBC 7.8 -- 7.3  NEUTROABS 6.6 -- 6.2  HGB 9.1* 9.9* --  HCT 28.6* 29.0* --  MCV 92.9 -- 93.4  PLT 227 -- 238   Cardiac Enzymes:  Basename 09/16/11 1546 09/16/11 0640 09/15/11 2301  CKTOTAL 70 69 70  CKMB 2.3 2.2 2.3  CKMBINDEX -- -- --  TROPONINI <0.30 <0.30 <0.30   BNP: No components found with this basename: POCBNP:3 D-Dimer: No results found for this basename: DDIMER:2 in the last 72 hours Hemoglobin A1C:  Basename  09/15/11 2302  HGBA1C 7.8*   Fasting Lipid Panel: No results found for this basename: CHOL,HDL,LDLCALC,TRIG,CHOLHDL,LDLDIRECT in the last 72 hours Thyroid Function Tests:  Basename 09/15/11 2302  TSH 6.035*  T4TOTAL --  T3FREE --  THYROIDAB --   Anemia Panel: No results found for this basename: VITAMINB12,FOLATE,FERRITIN,TIBC,IRON,RETICCTPCT in the last 72 hours Coag Panel:   Lab Results  Component Value Date   INR 1.05 09/15/2011   INR 1.06 09/15/2011   INR 1.05 10/25/2009    RADIOLOGY: Dg Chest Portable 1 View  09/15/2011  *RADIOLOGY REPORT*  Clinical Data: Syncope.  Diabetes.  Hypertension.  PORTABLE CHEST - 1 VIEW  Comparison: 10/09/2009  Findings: Cardiomegaly and pulmonary vascular congestion remain stable.  No evidence of acute infiltrate or edema.  No evidence of pleural effusion.  IMPRESSION: Stable cardiomegaly pulmonary vascular congestion.  No acute findings.  Original Report Authenticated By: Marlaine Hind, M.D.      ASSESSMENT: 1. Syncope secondary to severe bradycardia  2. Severe bradycardia with sinus arrest and ventricular escape resolved off Metoprolol 3. HTN  4. Obesity  5. DM  6. Sjogren's disease  7. Hypothyroidism with elevated TSH - full thyroid panel pending   PLAN:   1.  Await full thyroid panel 2.  Patient does not want PPM at this time.  Lifewatch monitor placed by my staff on patient today for 4 weeks 3.  D/C home in am  Sueanne Margarita, MD  09/17/2011  5:54 PM

## 2011-09-18 LAB — GLUCOSE, CAPILLARY
Glucose-Capillary: 120 mg/dL — ABNORMAL HIGH (ref 70–99)
Glucose-Capillary: 163 mg/dL — ABNORMAL HIGH (ref 70–99)

## 2011-09-18 NOTE — Progress Notes (Signed)
Pt discharge instructions and patient education complete. IV site d/c. Site WNL. D/C home with daughter via wheelchair. Tricia Gonzales

## 2011-09-18 NOTE — Discharge Summary (Signed)
Patient ID: Tricia Gonzales MRN: [id number] DOB/AGE: 70/02/1941 70 y.o.  Admit date: 09/15/2011 Discharge date: 09/18/2011  Primary Discharge Diagnosis: Bradycardia, syncope Secondary Discharge Diagnosis: Diabetes, hypertension, dyslipidemia, obesity, Sjogren's disease  Significant Diagnostic Studies: Telemetry demonstrated no evidence of further bradycardia/pauses.  Consults: Dr. Thompson Grayer, electrophysiology.  Hospital Course: 71 year old female previously on metoprolol 100 mg a day who was in her usual state of health until the evening of discharge had a syncopal event. She was sitting in her chair reading a brochure when she got, vision, heard a high pitched sound in her head, diaphoretic, nausea, slumped over in her chair. She tried to get up several times but "blacked out". Her sister called EMS. On arrival, EMS noted that she was bradycardic with a escape rhythm of 20-30. She was given atropine, Zoll pad placed.  Shortly thereafter, normal sinus rhythm heart rate in the 70s ensued. She is remaining there this entire hospitalization. Telemetry personally viewed. Consultation with electrophysiology took place. Plan was to discontinue metoprolol, DC with event monitor if rhythm was stable and it is. She knows not to drive for 6 months.  Dr. Radford Pax, on day prior to discharge, stated that patient does not 1 permanent pacemaker at this time. A LifeWatch monitor was placed by staff for 4 weeks. She requested that she be discharged today.  Thyroid panel showed a free T4 of 1.22, normal. TSH was slightly elevated at 6.0.  I had a lengthy discussion with Tricia Gonzales about discharge. Her first question to me was about whether or not she needed a pacemaker. I referred to both Dr. Radford Pax as well as Dr. Jackalyn Lombard notes. I discussed that the discontinuation of metoprolol was important as this may be playing an important role in her significant bradycardia. She may ultimately require a pacemaker. If she has  significant bradycardia once again on monitor, pacemaker will take place. At first, she was quite hesitant about going home. She once again had several questions about pacemaker. She said that she was shocked when she was told that she may need a pacemaker. I clearly stated to her that if she was unsettled with going home, we could keep her here over the weekend for potential pacemaker placement on Monday. She at one point said "what will monitor do if I have another episode ". I explained to her that the monitor does not provide any therapy but it will allow Dr. Radford Pax information that would lead her to have a pacemaker placed. She also stated that she was worried that she could go home and die. I told her that her telemetry all she was here in the hospital did not show any evidence of further bradycardia and that both Dr. Radford Pax and Dr. Rayann Heman felt comfortable with her being discharged with monitoring. She ultimately felt as though she didn't understand what the plan was for her and I assured her that both Dr. Radford Pax and Dr. Rayann Heman were in communication with each other discussing her case in detail.  Overall, no further syncope, no chest pain, no shortness of breath. She is sitting comfortably in her chair.     Discharge Exam: Blood pressure 108/80, pulse 81, temperature 98.5 F (36.9 C), temperature source Oral, resp. rate 18, height _0  (1.499 m), weight 131.543 kg (290 lb), SpO2 94.00%.    General: Alert and oriented x3 no acute distress mildly anxious  Cardiovascular: Regular rate and rhythm no murmurs  Lungs: Clear to auscultation bilaterally  Abdomen: Soft nontender normal active bowel  sounds  Extremities: Warm, dry. Trace edema.   Labs:   Lab Results  Component Value Date   WBC 7.8 09/15/2011   HGB 9.1* 09/15/2011   HCT 28.6* 09/15/2011   MCV 92.9 09/15/2011   PLT 227 09/15/2011     Lab 09/16/11 0640 09/15/11 2302  NA 140 --  K 4.3 --  CL 103 --  CO2 28 --  BUN 46* --    CREATININE 1.43* --  CALCIUM 8.8 --  PROT -- 6.2  BILITOT -- 0.2*  ALKPHOS -- 97  ALT -- 50*  AST -- 71*  GLUCOSE 62* --   Lab Results  Component Value Date   CKTOTAL 70 09/16/2011   CKMB 2.3 09/16/2011   TROPONINI <0.30 09/16/2011    FOLLOW UP PLANS AND APPOINTMENTS Discharge Orders    Future Orders Please Complete By Expires   Diet - low sodium heart healthy      Increase activity slowly        Medication List  As of 09/18/2011  2:39 PM   STOP taking these medications         metoprolol 100 MG tablet         TAKE these medications         acetaminophen 500 MG tablet   Commonly known as: TYLENOL   Take 1,000 mg by mouth at bedtime.      aspirin EC 81 MG tablet   Take 81 mg by mouth daily.      cholecalciferol 1000 UNITS tablet   Commonly known as: VITAMIN D   Take 2,000 Units by mouth at bedtime.      doxazosin 4 MG tablet   Commonly known as: CARDURA   Take 4 mg by mouth daily.      furosemide 40 MG tablet   Commonly known as: LASIX   Take 40 mg by mouth daily.      gabapentin 300 MG capsule   Commonly known as: NEURONTIN   Take 300-600 mg by mouth 2 (two) times daily. Pt takes 1 capsule in the am & 2 capsules in the pm      glipiZIDE 10 MG 24 hr tablet   Commonly known as: GLUCOTROL XL   Take 10 mg by mouth 2 (two) times daily.      montelukast 10 MG tablet   Commonly known as: SINGULAIR   Take 10 mg by mouth at bedtime.      mulitivitamin with minerals Tabs   Take 1 tablet by mouth daily.      pantoprazole 40 MG tablet   Commonly known as: PROTONIX   Take 40 mg by mouth daily.      pentoxifylline 400 MG CR tablet   Commonly known as: TRENTAL   Take 400 mg by mouth daily.      pioglitazone 45 MG tablet   Commonly known as: ACTOS   Take 45 mg by mouth daily.      simvastatin 20 MG tablet   Commonly known as: ZOCOR   Take 20 mg by mouth at bedtime.           Follow-up Information    Follow up with Sueanne Margarita, MD in 1 week.    Contact information:   [address] [place] [phone]          BRING ALL MEDICATIONS WITH YOU TO FOLLOW UP APPOINTMENTS  Time spent with patient to include physician time: 40 minutes spent  on discharge, discussion with patient, discussion with nursing, medical records, education, review of medications. SignedCandee Furbish 09/18/2011, 2:39 PM

## 2011-12-04 ENCOUNTER — Encounter: Payer: Self-pay | Admitting: *Deleted

## 2011-12-25 ENCOUNTER — Ambulatory Visit (INDEPENDENT_AMBULATORY_CARE_PROVIDER_SITE_OTHER): Payer: Medicare Other | Admitting: Internal Medicine

## 2011-12-25 ENCOUNTER — Encounter: Payer: Self-pay | Admitting: Internal Medicine

## 2011-12-25 VITALS — BP 110/70 | HR 100 | Ht 59.75 in | Wt 291.6 lb

## 2011-12-25 DIAGNOSIS — R109 Unspecified abdominal pain: Secondary | ICD-10-CM

## 2011-12-25 MED ORDER — MOVIPREP 100 G PO SOLR: ORAL | Status: DC

## 2011-12-25 MED ORDER — DICYCLOMINE HCL 10 MG PO CAPS: 10.0000 mg | ORAL_CAPSULE | Freq: Two times a day (BID) | ORAL | Status: DC

## 2011-12-25 NOTE — Progress Notes (Signed)
Tricia Gonzales 03-25-1941 MRN [id number]   History of Present Illness:  This is a 71 year old white female with left lower quadrant abdominal pain, right lower and upper quadrant discomfort, intermittent constipation and diarrhea as well as a history of complicated diverticular disease of the colon,  initially in 1990s, subsequently  necessitating  segmental resection in April 2011after presenting with diverticular abscess. She had a diverting colostomy for 5 months before it was taken down by Dr Excell Seltzer. She underwent a resection of a pelvic tumor which was benign. She is morbidly obese. She takes stool softeners every other day. There is no family history of colon cancer. She never had a full colonoscopy because of a partial obstruction of her left colon with diverticular disease. A barium enema in 1998 confirmed the presence of diverticulosis.   Past Medical History  Diagnosis Date  . Diabetes mellitus   . Hypertension   . Obesity   . Sjogren's disease   . Hyperlipidemia   . Anemia   . Diverticulosis   . Osteoarthritis   . Intestinal obstruction   . Hiatal hernia   . Esophageal stricture   . Vitamin B12 deficiency    Past Surgical History  Procedure Date  . Joint replacement     knee x 3  . Abdominal hysterectomy   . Colon resection     for rectovaginal fistula  . Cholecystectomy     reports that she quit smoking about 28 years ago. She has never used smokeless tobacco. She reports that she drinks alcohol. She reports that she does not use illicit drugs. family history includes Diabetes in her brother and mother and Hypertension in her sister.  There is no history of Colon cancer. Allergies  Allergen Reactions  . Norvasc (Amlodipine Besylate) Shortness Of Breath  . Glucophage (Metformin Hydrochloride) Other (See Comments)    "increases creatinine"  . Lisinopril     Headache   . Losartan     Headache   . Morphine And Related Other (See Comments)    "hallucinations"         Review of Systems: Positive for bloating and distention. Constipation and diarrhea. Negative for rectal bleeding negative for heartburn.  The remainder of the 10 point ROS is negative except as outlined in H&P   Physical Exam: General appearance  Well developed, in no distress. Massively obese Eyes- non icteric. HEENT nontraumatic, normocephalic. Mouth no lesions, tongue papillated, no cheilosis. Neck supple without adenopathy, thyroid not enlarged, no carotid bruits, no JVD. Lungs Clear to auscultation bilaterally. Cor normal S1, normal S2, regular rhythm, no murmur,  quiet precordium. Abdomen: Obese abdomen, soft, difficult to examineTenderness in left lower quadrant. No palpable mass. Also discomfort in right lower quadrant. Post open cholecystectomy scar in right upper quadrant. Normal active bowel sounds. Well-healed surgical scars. Rectal: Normal rectal sphincter tone. No stool, mucus Hemoccult negative. Extremities no pedal edema. Skin no lesions. Neurological alert and oriented x 3. Psychological normal mood and affect.  Assessment and Plan:  Problem #1 History of complicated diverticular disease with partial obstruction and diverticular abscess necessitating a temporary colostomy in 09/2009 which has now been reversed. Patient is complaining of left lower quadrant discomfort, which may be related to irritable bowel syndrome, adhesions or possibly recurrent diverticular disease. She has never had a full colonoscopy and for that reason we will proceed with a colonoscopy . We have discussed the prep and the sedation. She will use moviprep. We will also start her on Metamucil 1 teaspoon  daily. Samples were given. She will take Bentyl 10 mg twice a day for presumed irritable bowel syndrome.   12/25/2011 Delfin Edis

## 2011-12-25 NOTE — Patient Instructions (Addendum)
You have been scheduled for a colonoscopy. Please follow written instructions given to you at your visit today.  Please pick up your prep kit at the pharmacy within the next 1-3 days. We have sent the following medications to your pharmacy for you to pick up at your convenience: Bentyl Please purchase Metamucil over the counter. Take as directed. CC: Dr Kelton Pillar

## 2011-12-26 ENCOUNTER — Encounter: Payer: Self-pay | Admitting: Internal Medicine

## 2011-12-28 ENCOUNTER — Telehealth: Payer: Self-pay | Admitting: Internal Medicine

## 2011-12-28 NOTE — Telephone Encounter (Signed)
I have given the patient the option for switching to Dr Nichola Sizer hospital week, but patient states that she is unable to get up early for procedure. She states that she is going to get a friend to bring her to the 01/07/12 appt at 11 am instead of her daughter, but will call back if she still needs to switch days. I have advised her that I will be more than happy to do that if she needs me to do so.

## 2012-01-07 ENCOUNTER — Ambulatory Visit (HOSPITAL_COMMUNITY)
Admission: RE | Admit: 2012-01-07 | Discharge: 2012-01-07 | Disposition: A | Discharge status: Home / Self Care | Payer: Medicare Other | Source: Ambulatory Visit | Attending: Internal Medicine | Admitting: Internal Medicine

## 2012-01-07 ENCOUNTER — Encounter (HOSPITAL_COMMUNITY): Payer: Self-pay

## 2012-01-07 ENCOUNTER — Encounter (HOSPITAL_COMMUNITY)
Admission: RE | Disposition: A | Discharge status: Home / Self Care | Payer: Self-pay | Source: Ambulatory Visit | Attending: Internal Medicine

## 2012-01-07 DIAGNOSIS — K630 Abscess of intestine: Secondary | ICD-10-CM

## 2012-01-07 DIAGNOSIS — I10 Essential (primary) hypertension: Secondary | ICD-10-CM | POA: Insufficient documentation

## 2012-01-07 DIAGNOSIS — M3500 Sicca syndrome, unspecified: Secondary | ICD-10-CM | POA: Insufficient documentation

## 2012-01-07 DIAGNOSIS — R1032 Left lower quadrant pain: Secondary | ICD-10-CM

## 2012-01-07 DIAGNOSIS — Z90710 Acquired absence of both cervix and uterus: Secondary | ICD-10-CM | POA: Insufficient documentation

## 2012-01-07 DIAGNOSIS — E119 Type 2 diabetes mellitus without complications: Secondary | ICD-10-CM | POA: Insufficient documentation

## 2012-01-07 DIAGNOSIS — R109 Unspecified abdominal pain: Secondary | ICD-10-CM | POA: Insufficient documentation

## 2012-01-07 DIAGNOSIS — Z9049 Acquired absence of other specified parts of digestive tract: Secondary | ICD-10-CM | POA: Insufficient documentation

## 2012-01-07 DIAGNOSIS — E785 Hyperlipidemia, unspecified: Secondary | ICD-10-CM | POA: Insufficient documentation

## 2012-01-07 DIAGNOSIS — K5900 Constipation, unspecified: Secondary | ICD-10-CM | POA: Insufficient documentation

## 2012-01-07 HISTORY — PX: COLONOSCOPY: SHX5424

## 2012-01-07 LAB — GLUCOSE, CAPILLARY: Glucose-Capillary: 181 mg/dL — ABNORMAL HIGH (ref 70–99)

## 2012-01-07 SURGERY — COLONOSCOPY
Anesthesia: Moderate Sedation

## 2012-01-07 SURGERY — Surgical Case
Anesthesia: *Unknown

## 2012-01-07 MED ORDER — DIPHENHYDRAMINE HCL 50 MG/ML IJ SOLN
INTRAMUSCULAR | Status: AC
  Filled 2012-01-07: qty 1

## 2012-01-07 MED ORDER — FENTANYL CITRATE 0.05 MG/ML IJ SOLN
INTRAMUSCULAR | Status: DC | PRN
  Administered 2012-01-07 (×3): 25 ug via INTRAVENOUS

## 2012-01-07 MED ORDER — DIPHENHYDRAMINE HCL 50 MG/ML IJ SOLN
INTRAMUSCULAR | Status: DC | PRN
  Administered 2012-01-07: 25 mg via INTRAVENOUS

## 2012-01-07 MED ORDER — FENTANYL CITRATE 0.05 MG/ML IJ SOLN
INTRAMUSCULAR | Status: AC
  Filled 2012-01-07: qty 4

## 2012-01-07 MED ORDER — SODIUM CHLORIDE 0.9 % IV SOLN: Freq: Once | INTRAVENOUS | Status: DC

## 2012-01-07 MED ORDER — MIDAZOLAM HCL 5 MG/5ML IJ SOLN
INTRAMUSCULAR | Status: DC | PRN
  Administered 2012-01-07 (×3): 2 mg via INTRAVENOUS

## 2012-01-07 MED ORDER — MIDAZOLAM HCL 10 MG/2ML IJ SOLN
INTRAMUSCULAR | Status: AC
  Filled 2012-01-07: qty 4

## 2012-01-07 NOTE — H&P (View-Only) (Signed)
Tricia Gonzales 09/30/1940 MRN [id number]   History of Present Illness:  This is a 71-year-old white female with left lower quadrant abdominal pain, right lower and upper quadrant discomfort, intermittent constipation and diarrhea as well as a history of complicated diverticular disease of the colon,  initially in 1990s, subsequently  necessitating  segmental resection in April 2011after presenting with diverticular abscess. She had a diverting colostomy for 5 months before it was taken down by Dr Hoxworth. She underwent a resection of a pelvic tumor which was benign. She is morbidly obese. She takes stool softeners every other day. There is no family history of colon cancer. She never had a full colonoscopy because of a partial obstruction of her left colon with diverticular disease. A barium enema in 1998 confirmed the presence of diverticulosis.   Past Medical History  Diagnosis Date  . Diabetes mellitus   . Hypertension   . Obesity   . Sjogren's disease   . Hyperlipidemia   . Anemia   . Diverticulosis   . Osteoarthritis   . Intestinal obstruction   . Hiatal hernia   . Esophageal stricture   . Vitamin B12 deficiency    Past Surgical History  Procedure Date  . Joint replacement     knee x 3  . Abdominal hysterectomy   . Colon resection     for rectovaginal fistula  . Cholecystectomy     reports that she quit smoking about 28 years ago. She has never used smokeless tobacco. She reports that she drinks alcohol. She reports that she does not use illicit drugs. family history includes Diabetes in her brother and mother and Hypertension in her sister.  There is no history of Colon cancer. Allergies  Allergen Reactions  . Norvasc (Amlodipine Besylate) Shortness Of Breath  . Glucophage (Metformin Hydrochloride) Other (See Comments)    "increases creatinine"  . Lisinopril     Headache   . Losartan     Headache   . Morphine And Related Other (See Comments)    "hallucinations"         Review of Systems: Positive for bloating and distention. Constipation and diarrhea. Negative for rectal bleeding negative for heartburn.  The remainder of the 10 point ROS is negative except as outlined in H&P   Physical Exam: General appearance  Well developed, in no distress. Massively obese Eyes- non icteric. HEENT nontraumatic, normocephalic. Mouth no lesions, tongue papillated, no cheilosis. Neck supple without adenopathy, thyroid not enlarged, no carotid bruits, no JVD. Lungs Clear to auscultation bilaterally. Cor normal S1, normal S2, regular rhythm, no murmur,  quiet precordium. Abdomen: Obese abdomen, soft, difficult to examineTenderness in left lower quadrant. No palpable mass. Also discomfort in right lower quadrant. Post open cholecystectomy scar in right upper quadrant. Normal active bowel sounds. Well-healed surgical scars. Rectal: Normal rectal sphincter tone. No stool, mucus Hemoccult negative. Extremities no pedal edema. Skin no lesions. Neurological alert and oriented x 3. Psychological normal mood and affect.  Assessment and Plan:  Problem #1 History of complicated diverticular disease with partial obstruction and diverticular abscess necessitating a temporary colostomy in 09/2009 which has now been reversed. Patient is complaining of left lower quadrant discomfort, which may be related to irritable bowel syndrome, adhesions or possibly recurrent diverticular disease. She has never had a full colonoscopy and for that reason we will proceed with a colonoscopy . We have discussed the prep and the sedation. She will use moviprep. We will also start her on Metamucil 1 teaspoon   daily. Samples were given. She will take Bentyl 10 mg twice a day for presumed irritable bowel syndrome.   12/25/2011 Aldrin Engelhard  

## 2012-01-07 NOTE — Interval H&P Note (Signed)
History and Physical Interval Note:  01/07/2012 11:19 AM  Tricia Gonzales  has presented today for surgery, with the diagnosis of abdominal pain/dns  The various methods of treatment have been discussed with the patient and family. After consideration of risks, benefits and other options for treatment, the patient has consented to  Procedure(s) (LRB): COLONOSCOPY (N/A) as a surgical intervention .  The patient's history has been reviewed, patient examined, no change in status, stable for surgery.  I have reviewed the patients' chart and labs.  Questions were answered to the patient's satisfaction.     Delfin Edis

## 2012-01-07 NOTE — Op Note (Signed)
[facility] [place], [place]  [id number]  COLONOSCOPY PROCEDURE REPORT  PATIENT:  Tricia Gonzales, Tricia Gonzales  MR#:  [id number] BIRTHDATE:  1940-10-31, 29 yrs. old  GENDER:  female ENDOSCOPIST:  Lowella Bandy. Olevia Perches, MD REF. BY:  Kelton Pillar, M.D. PROCEDURE DATE:  01/07/2012 PROCEDURE:  Colonoscopy 48270 ASA CLASS:  Class III INDICATIONS:  Abdominal pain s/p sigmoid resection for diverticular abcess 09/2009, had temporary colostomy, hx of diverticular stricture 1998 MEDICATIONS:   These medications were titrated to patient response per physician's verbal order, Versed 6 mg, Benadryl 12.5 mg, Fentanyl 75 mcg  DESCRIPTION OF PROCEDURE:   After the risks and benefits and of the procedure were explained, informed consent was obtained. Digital rectal exam was performed and revealed no rectal masses. The Pentax Ped Colon [id number] endoscope was introduced through the anus and advanced to the cecum, which was identified by both the appendix and ileocecal valve.  The quality of the prep was good, using MoviPrep.  The instrument was then slowly withdrawn as the colon was fully examined. <<PROCEDUREIMAGES>>  FINDINGS:  There was evidence of a prior segmental colectomy. in the sigmoid colon (see image7 and image6). at 20 cm widely patent anastomosis  Moderate diverticulosis was found throughout the colon (see image5, image1, and image2).  This was otherwise a normal examination of the colon (see image3, image4, and image8). Retroflexed views in the rectum revealed no abnormalities.    The scope was then withdrawn from the patient and the procedure completed.  COMPLICATIONS:  None ENDOSCOPIC IMPRESSION: 1) Prior segmental colectomy in the sigmoid colon 2) Moderate diverticulosis throughout the colon 3) Otherwise normal examination RECOMMENDATIONS: abd. pain likely from symptomatic diverticulosis or adhesive disease, continue Metamucil/ bentyl 10 mg po bid  REPEAT EXAM:  In 10  year(s) for.  ______________________________ Lowella Bandy. Olevia Perches, MD  CC:  n. eSIGNED:   Lowella Bandy. Haja Crego at 01/07/2012 11:50 AM  Burnett Sheng, [phone]

## 2012-01-08 ENCOUNTER — Encounter (HOSPITAL_COMMUNITY): Payer: Self-pay

## 2012-01-11 ENCOUNTER — Encounter (HOSPITAL_COMMUNITY): Payer: Self-pay | Admitting: Internal Medicine

## 2012-01-19 ENCOUNTER — Telehealth: Payer: Self-pay | Admitting: Internal Medicine

## 2012-01-19 NOTE — Telephone Encounter (Signed)
Pt calling wanting to know if she needs a follow-up OV from colon done 01/07/12. The report states she needs recall colon in 10 years. Does pt need an OV? Please advise.

## 2012-01-19 NOTE — Telephone Encounter (Signed)
I would like her to see me in 2 months to discuss again her LLQ abd. Pain.

## 2012-01-20 NOTE — Telephone Encounter (Signed)
Spoke with patient and gave her recommendations. Scheduled OV on 03/18/12 at 2:15 PM.

## 2012-03-17 ENCOUNTER — Other Ambulatory Visit: Payer: Self-pay | Admitting: Family Medicine

## 2012-03-17 ENCOUNTER — Encounter (INDEPENDENT_AMBULATORY_CARE_PROVIDER_SITE_OTHER): Payer: Medicare Other | Admitting: Vascular Surgery

## 2012-03-17 DIAGNOSIS — R609 Edema, unspecified: Secondary | ICD-10-CM

## 2012-03-17 DIAGNOSIS — M79609 Pain in unspecified limb: Secondary | ICD-10-CM

## 2012-03-17 NOTE — Progress Notes (Unsigned)
Left lower extremity venous duplex performed @ [facility] 03/17/2012

## 2012-03-18 ENCOUNTER — Ambulatory Visit (INDEPENDENT_AMBULATORY_CARE_PROVIDER_SITE_OTHER): Payer: Medicare Other | Admitting: Internal Medicine

## 2012-03-18 ENCOUNTER — Encounter: Payer: Self-pay | Admitting: Internal Medicine

## 2012-03-18 VITALS — BP 120/50 | HR 84 | Ht 58.5 in | Wt 288.4 lb

## 2012-03-18 DIAGNOSIS — K5732 Diverticulitis of large intestine without perforation or abscess without bleeding: Secondary | ICD-10-CM

## 2012-03-18 DIAGNOSIS — R1032 Left lower quadrant pain: Secondary | ICD-10-CM

## 2012-03-18 NOTE — Progress Notes (Signed)
Tricia Gonzales [birth year] MRN [id number]   History of Present Illness:  This is a 71 year old white female with complicated diverticular disease and chronic LLQ abdominal pain. She has a history of a colonic stricture since 1998 and subsequently, diverticular abscess requiring segmental sigmoid resection and temporary colostomy in April 2011, which was taken down 5 months later. She had a recent colonoscopy in July 2013 which showed widely patent sigmoid anastomosis at 20 cm. She still had moderately severe diverticulosis. She has been on Metamucil and a stool softener every other day. The pain in the left lower quadrant has improved. 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 She also has Sjgren syndrome and diabetes for which she is followed by Dr. Laurann Montana.  Past Medical History  Diagnosis Date  . Diabetes mellitus   . Hypertension   . Obesity   . Sjogren's disease   . Hyperlipidemia   . Anemia   . Diverticulosis   . Osteoarthritis   . Intestinal obstruction   . Hiatal hernia   . Esophageal stricture   . Vitamin B12 deficiency    Past Surgical History  Procedure Date  . Joint replacement     knee x 3  . Abdominal hysterectomy   . Colon resection     for rectovaginal fistula  . Cholecystectomy   . Colonoscopy 01/07/2012    Procedure: COLONOSCOPY;  Surgeon: Lafayette Dragon, MD;  Location: [facility];  Service: Endoscopy;  Laterality: N/A;    reports that she quit smoking about 28 years ago. She has never used smokeless tobacco. She reports that she drinks alcohol. She reports that she does not use illicit drugs. family history includes Diabetes in her brother and mother and Hypertension in her sister.  There is no history of Colon cancer. Allergies  Allergen Reactions  . Norvasc (Amlodipine Besylate) Shortness Of Breath  . Glucophage (Metformin Hydrochloride) Other (See Comments)    "increases creatinine"  . Lisinopril    Headache   . Losartan     Headache   . Morphine And Related Other (See Comments)    "hallucinations"        Review of Systems: Occasional heartburn and indigestion for which she take protonic. Denies rectal bleeding  The remainder of the 10 point ROS is negative except as outlined in H&P    Assessment and Plan:  Problem #1 History of complicated diverticular disease. She is now 2 years post segmental sigmoid resection by Dr Excell Seltzer. She is doing well. Her left lower quadrant abdominal pain has improved. She will continue Bentyl, stool softeners and Metamucil. A recall colonoscopy will be due in 10 years depending on her medical status at that point. We will see her on an as necessary basis.  03/18/2012 Delfin Edis

## 2012-03-18 NOTE — Patient Instructions (Addendum)
CC: Dr.Elaine Laurann Montana

## 2012-06-03 ENCOUNTER — Other Ambulatory Visit (HOSPITAL_COMMUNITY): Payer: Self-pay | Admitting: *Deleted

## 2012-06-06 ENCOUNTER — Encounter (HOSPITAL_COMMUNITY)
Admission: RE | Admit: 2012-06-06 | Discharge: 2012-06-06 | Disposition: A | Discharge status: Home / Self Care | Payer: Medicare Other | Source: Ambulatory Visit | Attending: Nephrology | Admitting: Nephrology

## 2012-06-06 DIAGNOSIS — K56609 Unspecified intestinal obstruction, unspecified as to partial versus complete obstruction: Secondary | ICD-10-CM | POA: Insufficient documentation

## 2012-06-06 DIAGNOSIS — K5730 Diverticulosis of large intestine without perforation or abscess without bleeding: Secondary | ICD-10-CM | POA: Insufficient documentation

## 2012-06-06 DIAGNOSIS — D649 Anemia, unspecified: Secondary | ICD-10-CM | POA: Insufficient documentation

## 2012-06-06 DIAGNOSIS — K449 Diaphragmatic hernia without obstruction or gangrene: Secondary | ICD-10-CM | POA: Insufficient documentation

## 2012-06-06 MED ORDER — SODIUM CHLORIDE 0.9 % IV SOLN
INTRAVENOUS | Status: DC
  Administered 2012-06-06: 10:00:00 via INTRAVENOUS

## 2012-06-06 MED ORDER — FERUMOXYTOL INJECTION 510 MG/17 ML
510.0000 mg | INTRAVENOUS | Status: DC
  Administered 2012-06-06: 510 mg via INTRAVENOUS

## 2012-06-06 MED ORDER — FERUMOXYTOL INJECTION 510 MG/17 ML
INTRAVENOUS | Status: AC
  Administered 2012-06-06: 510 mg via INTRAVENOUS
  Filled 2012-06-06: qty 17

## 2012-06-20 ENCOUNTER — Encounter (HOSPITAL_COMMUNITY)
Admission: RE | Admit: 2012-06-20 | Discharge: 2012-06-20 | Disposition: A | Discharge status: Home / Self Care | Payer: Medicare Other | Source: Ambulatory Visit | Attending: Nephrology | Admitting: Nephrology

## 2012-06-20 MED ORDER — FERUMOXYTOL INJECTION 510 MG/17 ML
510.0000 mg | INTRAVENOUS | Status: DC
  Administered 2012-06-20: 510 mg via INTRAVENOUS

## 2012-06-20 MED ORDER — FERUMOXYTOL INJECTION 510 MG/17 ML
INTRAVENOUS | Status: AC
  Administered 2012-06-20: 510 mg via INTRAVENOUS
  Filled 2012-06-20: qty 17

## 2012-06-20 MED ORDER — SODIUM CHLORIDE 0.9 % IV SOLN
INTRAVENOUS | Status: DC
  Administered 2012-06-20: 11:00:00 via INTRAVENOUS

## 2013-07-20 ENCOUNTER — Other Ambulatory Visit: Payer: Self-pay | Admitting: Dermatology

## 2014-06-22 DIAGNOSIS — I319 Disease of pericardium, unspecified: Secondary | ICD-10-CM

## 2014-06-22 HISTORY — DX: Disease of pericardium, unspecified: I31.9

## 2014-07-18 ENCOUNTER — Other Ambulatory Visit: Payer: Self-pay | Admitting: Family Medicine

## 2014-08-08 ENCOUNTER — Other Ambulatory Visit: Payer: Self-pay | Admitting: Family Medicine

## 2014-08-08 ENCOUNTER — Ambulatory Visit
Admission: RE | Admit: 2014-08-08 | Discharge: 2014-08-08 | Disposition: A | Discharge status: Home / Self Care | Payer: Medicare Other | Source: Ambulatory Visit | Attending: Family Medicine | Admitting: Family Medicine

## 2014-08-08 DIAGNOSIS — R918 Other nonspecific abnormal finding of lung field: Secondary | ICD-10-CM

## 2014-08-09 ENCOUNTER — Inpatient Hospital Stay
Admission: RE | Admit: 2014-08-09 | Discharge: 2014-08-09 | Disposition: A | Discharge status: Home / Self Care | Payer: Self-pay | Source: Ambulatory Visit | Attending: Family Medicine | Admitting: Family Medicine

## 2014-08-09 ENCOUNTER — Other Ambulatory Visit: Payer: Self-pay | Admitting: Family Medicine

## 2014-08-09 DIAGNOSIS — R918 Other nonspecific abnormal finding of lung field: Secondary | ICD-10-CM

## 2014-08-13 ENCOUNTER — Other Ambulatory Visit: Payer: Self-pay | Admitting: Family Medicine

## 2014-08-13 DIAGNOSIS — R918 Other nonspecific abnormal finding of lung field: Secondary | ICD-10-CM

## 2014-08-17 ENCOUNTER — Other Ambulatory Visit: Payer: Self-pay | Admitting: Family Medicine

## 2014-08-17 ENCOUNTER — Ambulatory Visit
Admission: RE | Admit: 2014-08-17 | Discharge: 2014-08-17 | Disposition: A | Discharge status: Home / Self Care | Payer: Medicare Other | Source: Ambulatory Visit | Attending: Family Medicine | Admitting: Family Medicine

## 2014-08-17 DIAGNOSIS — R918 Other nonspecific abnormal finding of lung field: Secondary | ICD-10-CM

## 2015-03-22 ENCOUNTER — Ambulatory Visit (INDEPENDENT_AMBULATORY_CARE_PROVIDER_SITE_OTHER): Payer: Medicare Other | Admitting: Internal Medicine

## 2015-03-22 ENCOUNTER — Telehealth: Payer: Self-pay | Admitting: *Deleted

## 2015-03-22 ENCOUNTER — Encounter: Payer: Self-pay | Admitting: Internal Medicine

## 2015-03-22 VITALS — BP 108/64 | HR 79 | Temp 97.7°F | Resp 12 | Ht 59.0 in | Wt 259.8 lb

## 2015-03-22 DIAGNOSIS — M810 Age-related osteoporosis without current pathological fracture: Secondary | ICD-10-CM

## 2015-03-22 DIAGNOSIS — E785 Hyperlipidemia, unspecified: Secondary | ICD-10-CM | POA: Diagnosis not present

## 2015-03-22 LAB — COMPREHENSIVE METABOLIC PANEL
ALT: 11 U/L (ref 0–35)
AST: 18 U/L (ref 0–37)
Albumin: 3.9 g/dL (ref 3.5–5.2)
Alkaline Phosphatase: 62 U/L (ref 39–117)
BUN: 55 mg/dL — ABNORMAL HIGH (ref 6–23)
CO2: 30 mEq/L (ref 19–32)
Calcium: 9.6 mg/dL (ref 8.4–10.5)
Chloride: 103 mEq/L (ref 96–112)
Creatinine, Ser: 1.94 mg/dL — ABNORMAL HIGH (ref 0.40–1.20)
GFR: 26.84 mL/min — ABNORMAL LOW (ref >60.00)
Glucose, Bld: 135 mg/dL — ABNORMAL HIGH (ref 70–99)
Potassium: 4.9 mEq/L (ref 3.5–5.1)
Sodium: 139 mEq/L (ref 135–145)
Total Bilirubin: 0.3 mg/dL (ref 0.2–1.2)
Total Protein: 7 g/dL (ref 6.0–8.3)

## 2015-03-22 LAB — VITAMIN D 25 HYDROXY (VIT D DEFICIENCY, FRACTURES): VITD: 30.93 ng/mL (ref 30.00–100.00)

## 2015-03-22 LAB — TSH: TSH: 3.1 u[IU]/mL (ref 0.35–4.50)

## 2015-03-22 NOTE — Telephone Encounter (Signed)
Please initiate a PA for the Prolia injection for pt. Thank you.

## 2015-03-22 NOTE — Progress Notes (Addendum)
Patient ID: Tricia Gonzales, female   DOB: 10/31/1940, 74 y.o.   MRN: [id number]   HPI  Tricia Gonzales is a 74 y.o.-year-old female, referred by her PCP, Reginia Naas, MD, for management of osteoporosis.  Pt was dx with osteopenia several years ago, then OP in 12/2014.  She denies fractures or falls. No dizziness/vertigo/orthostasis. Had 3 TKR surgeries.  I reviewed pt's DEXA scans: Date L1-L3 T score FN T score  01/15/2015 -1.1 RFN: -4.1 LFN: -3.0   She has been on the following OP treatments:  - bisphosphonates (Fosamax, Actonel) x 2 years >> stopped 2010  She has GERD >> on Protonix. Also, h/o esophageal stricture.  She has a h/o vitamin D deficiency in the past >> was on Ergocalciferol 50,000 IU 10 years ago (Dr Rockwell Alexandria - rheumatology).  Pt takes calcium and vitamin D (1 MVI daily) - stopped extra vit D in 06/2014 - was taking 2000 IU daily. She also eats dairy and green, leafy, vegetables.   No weight bearing exercises.   She has a h/o kidney stone x1 in her 38s.   She does not take high vitamin A doses.  She was on chronic glucocorticoid tx for Lipodermatosclerosis (Dr Ubaldo Glassing) >> now off. Also, was getting injections for Low back pain, shoulder, knee.  No h/o hyper/hypocalcemia. No h/o hyperparathyroidism.  Lab Results  Component Value Date   CALCIUM 8.8 09/16/2011   CALCIUM 9.1 09/15/2011   CALCIUM 9.5 03/11/2010   CALCIUM 8.4 10/25/2009   CALCIUM 7.8* 10/20/2009   CALCIUM 8.0* 10/14/2009   CALCIUM 8.4 10/12/2009   CALCIUM 7.9* 10/11/2009   CALCIUM 7.6* 10/10/2009   CALCIUM 7.4* 10/09/2009   No h/o thyrotoxicosis. Reviewed TSH recent levels:  Lab Results  Component Value Date   TSH 6.035* 09/15/2011   + h/o stage 3 CKD. Last BUN/Cr: Lab Results  Component Value Date   BUN 46* 09/16/2011   CREATININE 1.43* 09/16/2011   Menopause was at 74 y/o.   Pt does not have a known FH of osteoporosis, but her mother had a hip fx.  I reviewed her chart and she  also has a history of DM2 - On glipizide XL and Januvia. Recent HbA1c (03/19/2015) was 7.8% (increased from 7.5%). She also has a history of hypertension and hyperlipidemia.  ROS: Constitutional: + weight gain, + fatigue, + subjective hypothermia, + poor sleep Eyes: + blurry vision, no xerophthalmia ENT:+  sore throat, no nodules palpated in throat, + dysphagia/no odynophagia, + hoarseness Cardiovascular: no CP/+ SOB/no palpitations/+ leg swelling Respiratory: + cough/+ SOB/+ wheezing Gastrointestinal: no N/V/D/C Musculoskeletal: + muscle/+ joint aches Skin: no rashes, + easy bruising, + itching, + hair loss Neurological: no tremors/numbness/tingling/dizziness, + HA Psychiatric: no depression/+ anxiety  Past Medical History  Diagnosis Date  . Diabetes mellitus   . Hypertension   . Obesity   . Sjogren's disease   . Hyperlipidemia   . Anemia   . Diverticulosis   . Osteoarthritis   . Intestinal obstruction   . Hiatal hernia   . Esophageal stricture   . Vitamin B12 deficiency    Past Surgical History  Procedure Laterality Date  . Joint replacement      knee x 3  . Abdominal hysterectomy    . Colon resection      for rectovaginal fistula  . Cholecystectomy    . Colonoscopy  01/07/2012    Procedure: COLONOSCOPY;  Surgeon: Lafayette Dragon, MD;  Location: [facility];  Service: Endoscopy;  Laterality:  N/A;   Social History   Social History  . Marital Status: Divorced    Spouse Name: N/A  . Number of Children: 2   Occupational History  . retired   Social History Main Topics  . Smoking status: Former Smoker    Quit date: 06/23/1983  . Smokeless tobacco: Never Used  . Alcohol Use: Yes     Comment: occasionally - wine, once a week, x2 drinks  . Drug Use: No   Current Outpatient Prescriptions on File Prior to Visit  Medication Sig Dispense Refill  . acetaminophen (TYLENOL) 500 MG tablet Take 1,500 mg by mouth at bedtime.     Marland Kitchen aspirin EC 81 MG tablet Take 81 mg by mouth  daily.    Marland Kitchen doxazosin (CARDURA) 4 MG tablet Take 4 mg by mouth daily.    . furosemide (LASIX) 40 MG tablet Take 40 mg by mouth daily.    Marland Kitchen gabapentin (NEURONTIN) 300 MG capsule Take 300-600 mg by mouth 2 (two) times daily. Pt takes 1 capsule in the am & 2 capsules in the pm    . glipiZIDE (GLUCOTROL XL) 10 MG 24 hr tablet Take 10 mg by mouth 2 (two) times daily.    . irbesartan (AVAPRO) 150 MG tablet Take 150 mg by mouth daily.     . montelukast (SINGULAIR) 10 MG tablet Take 10 mg by mouth at bedtime.     . Multiple Vitamin (MULITIVITAMIN WITH MINERALS) TABS Take 1 tablet by mouth daily.    . pantoprazole (PROTONIX) 40 MG tablet Take 40 mg by mouth daily.    . pentoxifylline (TRENTAL) 400 MG CR tablet Take 400 mg by mouth daily.    . sitaGLIPtin (JANUVIA) 100 MG tablet Take 100 mg by mouth daily.    . cholecalciferol (VITAMIN D) 1000 UNITS tablet Take 2,000 Units by mouth at bedtime.    . pioglitazone (ACTOS) 45 MG tablet Take 45 mg by mouth daily.     No current facility-administered medications on file prior to visit.   Allergies  Allergen Reactions  . Norvasc [Amlodipine Besylate] Shortness Of Breath  . Glucophage [Metformin Hydrochloride] Other (See Comments)    "increases creatinine"  . Lisinopril     Headache   . Losartan     Headache   . Metoprolol Other (See Comments)    Passed out  . Morphine And Related Other (See Comments)    "hallucinations"  . Simvastatin Other (See Comments)    Body aches  . Trulicity [Dulaglutide] Other (See Comments)    Severe mood swings   Family History  Problem Relation Age of Onset  . Diabetes Mother   . Diabetes Brother   . Colon cancer Neg Hx   . Hypertension Sister   Father with lung CA.  PE: BP 108/64 mmHg  Pulse 79  Temp(Src) 97.7 F (36.5 C) (Oral)  Resp 12  Ht _0  (1.499 m)  Wt 259 lb 12.8 oz (117.845 kg)  BMI 52.45 kg/m2  SpO2 95% Wt Readings from Last 3 Encounters:  03/22/15 259 lb 12.8 oz (117.845 kg)  06/20/12  274 lb (124.286 kg)  06/06/12 274 lb (124.286 kg)   Constitutional: obese, in NAD. + kyphosis. Eyes: PERRLA, EOMI, no exophthalmos ENT: moist mucous membranes, no thyromegaly, no cervical lymphadenopathy Cardiovascular: RRR, No MRG, + B leg swelling, pitting Respiratory: CTA B Gastrointestinal: abdomen soft, NT, ND, BS+ Musculoskeletal: no deformities, strength intact in all 4 Skin: moist, warm, no rashes Neurological: no tremor with  outstretched hands, DTR normal in all 4  Assessment: 1. Osteoporosis  Plan: 1. Osteoporosis - likely postmenopausal + 2/2 prolonged steroid use - Discussed about increased risk of fracture, depending on the T score, greatly increased when the T score is lower than -2.5, but it is actually a continuum and -2.5 should not be regarded as an absolute threshold. We reviewed her DEXA scans together, and I explained that based on the T scores, she has an increased risk for fractures.  - We discussed about the different medication classes, benefits and side effects (including atypical fractures and ONJ - no dental workup in progress or planned).  - since she has GERD and also h/o Esophageal stricture, she is not a candidate for oral bisphosphonates, so my first choice would be sq denosumab (Prolia) - she has decreased GFR, so she is not a good candidate for iv bisphosphonate (Reclast). I don't think she is a candidate for Teriparatide at the moment.  I explained the mechanism of action and expected benefits. She agrees to start Prolia. - we reviewed her dietary and supplemental calcium and vitamin D intake - given her specific instructions about food sources for these - see pt instructions  - discussed fall precautions   - given handout from [facility]: weight bearing exercises - advised to do this every day or at least 5/7 days - we discussed about maintaining a good amount of protein in her diet. The recommended daily protein intake is ~0.8 g  per kilogram per day (~90 g a day for her). I advised her to try to aim for this amount, since a diet low in proteins can exacerbate osteoporosis. Also, avoid smoking or >2 drinks of alcohol a day. - We will check the following tests:  Vitamin D  CMP  TSH - if all normal, will arrange for Prolia infusion - will check a new DEXA scan in 2 years - will see pt back in a year  - time spent with the patient: 1 hour, of which >50% was spent in obtaining information about her symptoms, reviewing her previous labs, evaluations, and treatments, counseling her about her condition (please see the discussed topics above), and developing a plan to further investigate it; she had a number of questions which I addressed.  Office Visit on 03/22/2015  Component Date Value Ref Range Status  . Sodium 03/22/2015 139  135 - 145 mEq/L Final  . Potassium 03/22/2015 4.9  3.5 - 5.1 mEq/L Final  . Chloride 03/22/2015 103  96 - 112 mEq/L Final  . CO2 03/22/2015 30  19 - 32 mEq/L Final  . Glucose, Bld 03/22/2015 135* 70 - 99 mg/dL Final  . BUN 03/22/2015 55* 6 - 23 mg/dL Final  . Creatinine, Ser 03/22/2015 1.94* 0.40 - 1.20 mg/dL Final  . Total Bilirubin 03/22/2015 0.3  0.2 - 1.2 mg/dL Final  . Alkaline Phosphatase 03/22/2015 62  39 - 117 U/L Final  . AST 03/22/2015 18  0 - 37 U/L Final  . ALT 03/22/2015 11  0 - 35 U/L Final  . Total Protein 03/22/2015 7.0  6.0 - 8.3 g/dL Final  . Albumin 03/22/2015 3.9  3.5 - 5.2 g/dL Final  . Calcium 03/22/2015 9.6  8.4 - 10.5 mg/dL Final  . GFR 03/22/2015 26.84* >60.00 mL/min Final  . VITD 03/22/2015 30.93  30.00 - 100.00 ng/mL Final  . TSH 03/22/2015 3.10  0.35 - 4.50 uIU/mL Final    Vitamin D is normal, but on  the low side, I will advise the patient to start a supplement of 2000 units of vitamin D daily.  Her thyroid test is normal.  Her GFR is slightly lower than before. We'll continue with the plan to add Prolia, based on the following reference: J Bone Miner Res.  2012 Jul; 27(7): 2446-2863.  Published online 2012 Mar 28. doi: 10.1002/jbmr.J5811397  PMCID: [id number]  A Single-Dose Study of Denosumab in Patients With Various Degrees of Renal Impairment

## 2015-03-22 NOTE — Patient Instructions (Signed)
Please stop at the lab.  We will try to get Prolia to covered by your insurance.  Please return in 1 year.  How Can I Prevent Falls? Men and women with osteoporosis need to take care not to fall down. Falls can break bones. Some reasons people fall are: Poor vision  Poor balance  Certain diseases that affect how you walk  Some types of medicine, such as sleeping pills.  Some tips to help prevent falls outdoors are: Use a cane or walker  Wear rubber-soled shoes so you don't slip  Walk on grass when sidewalks are slippery  In winter, put salt or kitty litter on icy sidewalks.  Some ways to help prevent falls indoors are: Keep rooms free of clutter, especially on floors  Use plastic or carpet runners on slippery floors  Wear low-heeled shoes that provide good support  Do not walk in socks, stockings, or slippers  Be sure carpets and area rugs have skid-proof backs or are tacked to the floor  Be sure stairs are well lit and have rails on both sides  Put grab bars on bathroom walls near tub, shower, and toilet  Use a rubber bath mat in the shower or tub  Keep a flashlight next to your bed  Use a sturdy step stool with a handrail and wide steps  Add more lights in rooms (and night lights) Buy a cordless phone to keep with you so that you don't have to rush to the phone       when it rings and so that you can call for help if you fall.   (adapted from http://www.niams.NightlifePreviews.se)  Dietary sources of calcium and vitamin D:  Calcium content (mg) - http://www.niams.MoviePins.co.za  Fortified oatmeal, 1 packet 350  Sardines, canned in oil, with edible bones, 3 oz. 324  Cheddar cheese, 1 oz. shredded 306  Milk, nonfat, 1 cup 302  Milkshake, 1 cup 300  Yogurt, plain, low-fat, 1 cup 300  Soybeans, cooked, 1 cup 261  Tofu, firm, with calcium,  cup 204  Orange juice, fortified with calcium, 6 oz. 200-260 (varies)   Salmon, canned, with edible bones, 3 oz. 181  Pudding, instant, made with 2% milk,  cup 153  Baked beans, 1 cup [place], 1% milk fat, 1 cup 138  Spaghetti, lasagna, 1 cup 125  Frozen yogurt, vanilla, soft-serve,  cup 103  Ready-to-eat cereal, fortified with calcium, 1 cup 100-1,000 (varies)  Cheese pizza, 1 slice 707  Fortified waffles, 2 100  Turnip greens, boiled,  cup 99  Broccoli, raw, 1 cup 90  Ice cream, vanilla,  cup 85  Soy or rice milk, fortified with calcium, 1 cup 80-500 (varies)   Vitamin D content (International Units, IU) - https://www.ars.usda.gov Cod liver oil, 1 tablespoon 1,360  Swordfish, cooked, 3 oz 566  Salmon (sockeye), cooked, 3 oz 447  Tuna fish, canned in water, drained, 3 oz 154  Orange juice fortified with vitamin D, 1 cup (check product labels, as amount of added vitamin D varies) 137  Milk, nonfat, reduced fat, and whole, vitamin D-fortified, 1 cup 115-124  Yogurt, fortified with 20% of the daily value for vitamin D, 6 oz 80  Margarine, fortified, 1 tablespoon 60  Sardines, canned in oil, drained, 2 sardines 46  Liver, beef, cooked, 3 oz 42  Egg, 1 large (vitamin D is found in yolk) 41  Ready-to-eat cereal, fortified with 10% of the daily value for vitamin D, 0.75-1 cup  40  Cheese, Swiss, 1 oz 6   Exercise for Strong Bones (from National Osteoporosis Foundation) There are two types of exercises that are important for building and maintaining bone density:  weight-bearing and muscle-strengthening exercises. Weight-bearing Exercises These exercises include activities that make you move against gravity while staying upright. Weight-bearing exercises can be high-impact or low-impact. High-impact weight-bearing exercises help build bones and keep them strong. If you have broken a bone due to osteoporosis or are at risk of breaking a bone, you may need to avoid high-impact exercises. If you're not sure, you should check with your healthcare  provider. Examples of high-impact weight-bearing exercises are: . Dancing . Doing high-impact aerobics . Hiking . Jogging/running . Jumping Rope . Stair climbing . Tennis Low-impact weight-bearing exercises can also help keep bones strong and are a safe alternative if you cannot do high-impact exercises. Examples of low-impact weight-bearing exercises are: . Using elliptical training machines . Doing low-impact aerobics . Using stair-step machines . Fast walking on a treadmill or outside Muscle-Strengthening Exercises These exercises include activities where you move your body, a weight or some other resistance against gravity. They are also known as resistance exercises and include: . Lifting weights . Using elastic exercise bands . Using weight machines . Lifting your own body weight . Functional movements, such as standing and rising up on your toes Yoga and Pilates can also improve strength, balance and flexibility. However, certain positions may not be safe for people with osteoporosis or those at increased risk of broken bones. For example, exercises that have you bend forward may increase the chance of breaking a bone in the spine. A physical therapist should be able to help you learn which exercises are safe and appropriate for you. Non-Impact Exercises Non-impact exercises can help you to improve balance, posture and how well you move in everyday activities. These exercises can also help to increase muscle strength and decrease the risk of falls and broken bones. Some of these exercises include: . Balance exercises that strengthen your legs and test your balance, such as Tai Chi, can decrease your risk of falls. . Posture exercises that improve your posture and reduce rounded or "sloping" shoulders can help you decrease the chance of breaking a bone, especially in the spine. . Functional exercises that improve how well you move can help you with everyday activities and decrease  your chance of falling and breaking a bone. For example, if you have trouble getting up from a chair or climbing stairs, you should do these activities as exercises. A physical therapist can teach you balance, posture and functional exercises. Starting a New Exercise Program If you haven't exercised regularly for a while, check with your healthcare provider before beginning a new exercise program-particularly if you have health problems such as heart disease, diabetes or high blood pressure. If you're at high risk of breaking a bone, you should work with a physical therapist to develop a safe exercise program. Once you have your healthcare provider's approval, start slowly. If you've already broken bones in the spine because of osteoporosis, be very careful to avoid activities that require reaching down, bending forward, rapid twisting motions, heavy lifting and those that increase your chance of a fall. As you get started, your muscles may feel sore for a day or two after you exercise. If soreness lasts longer, you may be working too hard and need to ease up. Exercises should be done in a pain-free range of motion. How Much Exercise Do You Need?   Weight-bearing exercises 30 minutes on most days of the week. Do a 30-minutesession or multiple sessions spread out throughout the day. The benefits to your bones are the same.   Muscle-strengthening exercises Two to three days per week. If you don't have much time for strengthening/resistance training, do small amounts at a time. You can do just one body part each day. For example do arms one day, legs the next and trunk the next. You can also spread these exercises out during your normal day.  Balance, posture and functional exercises Every day or as often as needed. You may want to focus on one area more than the others. If you have fallen or lose your balance, spend time doing balance exercises. If you are getting rounded shoulders, work more on posture  exercises. If you have trouble climbing stairs or getting up from the couch, do more functional exercises. You can also perform these exercises at one time or spread them during your day. Work with a phyiscal therapist to learn the right exercises for you.     

## 2015-03-25 NOTE — Telephone Encounter (Signed)
I have electronically submitted pt's info for [place] verification and will notify you once I have a response. Thank you.

## 2015-04-01 NOTE — Telephone Encounter (Signed)
I have rec'd Ms. Backhaus ins verification for Prolia and since she has met her $200 deductible her ins will cover 100% of the contracted rate, which means she will have a $0 estimated responsibility.  Please advise pt this is only an estimate and we will not know an exact amt until her insurance pays.  I have sent a copy of the summary of benefits to be scanned into her chart.  Please let me know if you have any questions. Thank you.

## 2015-04-05 NOTE — Telephone Encounter (Signed)
Called pt and advised her Prolia inj covered. Pt scheduled inj for 04/12/15 at 11:30 am.

## 2015-04-09 ENCOUNTER — Telehealth: Payer: Self-pay | Admitting: Internal Medicine

## 2015-04-09 NOTE — Telephone Encounter (Signed)
Patient called stating that she has a lot of questions and concerns regarding her prolia    Please advise    Thank you

## 2015-04-10 NOTE — Telephone Encounter (Signed)
Pt looked up online the sx of Prolia. Pt was concerned about the constipation that is one of the side effects. The other concern is the joint pain. Pt has osteoarthritis and is concerned about the possible added pain that it cause her back. Please address pt's concerns and advise. Thank you.

## 2015-04-11 NOTE — Telephone Encounter (Signed)
These are really rare side effects, and the benefit of not having an osteoporotic fracture overwhelmingly outweighs the risk of having these complications.

## 2015-04-11 NOTE — Telephone Encounter (Signed)
Called pt and lvm advising her per Dr Arman Filter message below. Advised pt to call and let us know what she decides.

## 2015-04-12 ENCOUNTER — Other Ambulatory Visit (INDEPENDENT_AMBULATORY_CARE_PROVIDER_SITE_OTHER): Payer: Medicare Other | Admitting: *Deleted

## 2015-04-12 ENCOUNTER — Ambulatory Visit (INDEPENDENT_AMBULATORY_CARE_PROVIDER_SITE_OTHER): Payer: Medicare Other | Admitting: *Deleted

## 2015-04-12 DIAGNOSIS — M810 Age-related osteoporosis without current pathological fracture: Secondary | ICD-10-CM | POA: Diagnosis not present

## 2015-04-12 MED ORDER — DENOSUMAB 60 MG/ML ~~LOC~~ SOLN
60.0000 mg | Freq: Once | SUBCUTANEOUS | Status: AC
  Administered 2015-04-12: 60 mg via SUBCUTANEOUS

## 2015-06-13 ENCOUNTER — Other Ambulatory Visit (HOSPITAL_COMMUNITY): Payer: Self-pay | Admitting: Orthopedic Surgery

## 2015-06-13 DIAGNOSIS — Z96652 Presence of left artificial knee joint: Principal | ICD-10-CM

## 2015-06-13 DIAGNOSIS — T8484XA Pain due to internal orthopedic prosthetic devices, implants and grafts, initial encounter: Secondary | ICD-10-CM

## 2015-06-17 ENCOUNTER — Encounter (HOSPITAL_COMMUNITY): Payer: Self-pay | Admitting: Emergency Medicine

## 2015-06-17 ENCOUNTER — Inpatient Hospital Stay (HOSPITAL_COMMUNITY)
Admission: EM | Admit: 2015-06-17 | Discharge: 2015-06-19 | DRG: 243 | Disposition: A | Discharge status: Home / Self Care | Payer: Medicare Other | Attending: Cardiology | Admitting: Cardiology

## 2015-06-17 DIAGNOSIS — I441 Atrioventricular block, second degree: Secondary | ICD-10-CM | POA: Diagnosis present

## 2015-06-17 DIAGNOSIS — D509 Iron deficiency anemia, unspecified: Secondary | ICD-10-CM

## 2015-06-17 DIAGNOSIS — R55 Syncope and collapse: Secondary | ICD-10-CM

## 2015-06-17 DIAGNOSIS — Z96659 Presence of unspecified artificial knee joint: Secondary | ICD-10-CM | POA: Diagnosis present

## 2015-06-17 DIAGNOSIS — I442 Atrioventricular block, complete: Secondary | ICD-10-CM | POA: Diagnosis present

## 2015-06-17 DIAGNOSIS — M3500 Sicca syndrome, unspecified: Secondary | ICD-10-CM | POA: Diagnosis present

## 2015-06-17 DIAGNOSIS — E1122 Type 2 diabetes mellitus with diabetic chronic kidney disease: Secondary | ICD-10-CM | POA: Diagnosis present

## 2015-06-17 DIAGNOSIS — Z950 Presence of cardiac pacemaker: Secondary | ICD-10-CM | POA: Diagnosis not present

## 2015-06-17 DIAGNOSIS — N183 Chronic kidney disease, stage 3 (moderate): Secondary | ICD-10-CM | POA: Diagnosis present

## 2015-06-17 DIAGNOSIS — Z7982 Long term (current) use of aspirin: Secondary | ICD-10-CM

## 2015-06-17 DIAGNOSIS — M1990 Unspecified osteoarthritis, unspecified site: Secondary | ICD-10-CM | POA: Diagnosis present

## 2015-06-17 DIAGNOSIS — D649 Anemia, unspecified: Secondary | ICD-10-CM | POA: Diagnosis present

## 2015-06-17 DIAGNOSIS — I495 Sick sinus syndrome: Principal | ICD-10-CM | POA: Diagnosis present

## 2015-06-17 DIAGNOSIS — I272 Other secondary pulmonary hypertension: Secondary | ICD-10-CM | POA: Diagnosis present

## 2015-06-17 DIAGNOSIS — E538 Deficiency of other specified B group vitamins: Secondary | ICD-10-CM | POA: Diagnosis present

## 2015-06-17 DIAGNOSIS — Z7984 Long term (current) use of oral hypoglycemic drugs: Secondary | ICD-10-CM

## 2015-06-17 DIAGNOSIS — Z01810 Encounter for preprocedural cardiovascular examination: Secondary | ICD-10-CM

## 2015-06-17 DIAGNOSIS — I4949 Other premature depolarization: Secondary | ICD-10-CM | POA: Diagnosis not present

## 2015-06-17 DIAGNOSIS — E785 Hyperlipidemia, unspecified: Secondary | ICD-10-CM | POA: Diagnosis present

## 2015-06-17 DIAGNOSIS — I129 Hypertensive chronic kidney disease with stage 1 through stage 4 chronic kidney disease, or unspecified chronic kidney disease: Secondary | ICD-10-CM | POA: Diagnosis present

## 2015-06-17 DIAGNOSIS — Z6841 Body Mass Index (BMI) 40.0 and over, adult: Secondary | ICD-10-CM | POA: Diagnosis not present

## 2015-06-17 DIAGNOSIS — Z87891 Personal history of nicotine dependence: Secondary | ICD-10-CM | POA: Diagnosis not present

## 2015-06-17 DIAGNOSIS — M810 Age-related osteoporosis without current pathological fracture: Secondary | ICD-10-CM | POA: Diagnosis present

## 2015-06-17 DIAGNOSIS — R001 Bradycardia, unspecified: Secondary | ICD-10-CM | POA: Diagnosis present

## 2015-06-17 LAB — COMPREHENSIVE METABOLIC PANEL
ALT: 17 U/L (ref 14–54)
AST: 24 U/L (ref 15–41)
Albumin: 3.4 g/dL — ABNORMAL LOW (ref 3.5–5.0)
Alkaline Phosphatase: 53 U/L (ref 38–126)
Anion gap: 9 (ref 5–15)
BUN: 55 mg/dL — ABNORMAL HIGH (ref 6–20)
CO2: 24 mmol/L (ref 22–32)
Calcium: 8.7 mg/dL — ABNORMAL LOW (ref 8.9–10.3)
Chloride: 108 mmol/L (ref 101–111)
Creatinine, Ser: 1.89 mg/dL — ABNORMAL HIGH (ref 0.44–1.00)
GFR calc Af Amer: 29 mL/min — ABNORMAL LOW (ref >60)
GFR calc non Af Amer: 25 mL/min — ABNORMAL LOW (ref >60)
Glucose, Bld: 236 mg/dL — ABNORMAL HIGH (ref 65–99)
Potassium: 5 mmol/L (ref 3.5–5.1)
Sodium: 141 mmol/L (ref 135–145)
Total Bilirubin: 0.4 mg/dL (ref 0.3–1.2)
Total Protein: 6.5 g/dL (ref 6.5–8.1)

## 2015-06-17 LAB — CBC
HCT: 28 % — ABNORMAL LOW (ref 36.0–46.0)
Hemoglobin: 8.2 g/dL — ABNORMAL LOW (ref 12.0–15.0)
MCH: 25.9 pg — ABNORMAL LOW (ref 26.0–34.0)
MCHC: 29.3 g/dL — ABNORMAL LOW (ref 30.0–36.0)
MCV: 88.6 fL (ref 78.0–100.0)
Platelets: 295 10*3/uL (ref 150–400)
RBC: 3.16 MIL/uL — ABNORMAL LOW (ref 3.87–5.11)
RDW: 14.6 % (ref 11.5–15.5)
WBC: 7.4 10*3/uL (ref 4.0–10.5)

## 2015-06-17 LAB — GLUCOSE, CAPILLARY
Glucose-Capillary: 108 mg/dL — ABNORMAL HIGH (ref 65–99)
Glucose-Capillary: 156 mg/dL — ABNORMAL HIGH (ref 65–99)

## 2015-06-17 LAB — TROPONIN I: Troponin I: <0.03 ng/mL (ref <0.031)

## 2015-06-17 MED ORDER — PIOGLITAZONE HCL 45 MG PO TABS
45.0000 mg | ORAL_TABLET | Freq: Every day | ORAL | Status: DC
Start: 2015-06-18 — End: 2015-06-18
  Filled 2015-06-17 (×2): qty 1

## 2015-06-17 MED ORDER — SODIUM CHLORIDE 0.9 % IJ SOLN: 3.0000 mL | INTRAMUSCULAR | Status: DC | PRN

## 2015-06-17 MED ORDER — IRBESARTAN 150 MG PO TABS
150.0000 mg | ORAL_TABLET | Freq: Every day | ORAL | Status: DC
  Administered 2015-06-17 – 2015-06-19 (×3): 150 mg via ORAL
  Filled 2015-06-17 (×3): qty 1

## 2015-06-17 MED ORDER — OXYCODONE-ACETAMINOPHEN 5-325 MG PO TABS: 1.0000 | ORAL_TABLET | Freq: Four times a day (QID) | ORAL | Status: DC | PRN

## 2015-06-17 MED ORDER — HEPARIN SODIUM (PORCINE) 5000 UNIT/ML IJ SOLN: 5000.0000 [IU] | Freq: Three times a day (TID) | INTRAMUSCULAR | Status: DC

## 2015-06-17 MED ORDER — GLIPIZIDE ER 10 MG PO TB24
10.0000 mg | ORAL_TABLET | Freq: Two times a day (BID) | ORAL | Status: DC
  Administered 2015-06-17 – 2015-06-19 (×3): 10 mg via ORAL
  Filled 2015-06-17 (×6): qty 1

## 2015-06-17 MED ORDER — HYDROXYCHLOROQUINE SULFATE 200 MG PO TABS
400.0000 mg | ORAL_TABLET | Freq: Every day | ORAL | Status: DC
  Administered 2015-06-17 – 2015-06-19 (×3): 400 mg via ORAL
  Filled 2015-06-17 (×3): qty 2

## 2015-06-17 MED ORDER — SODIUM CHLORIDE 0.9 % IJ SOLN
3.0000 mL | Freq: Two times a day (BID) | INTRAMUSCULAR | Status: DC
  Administered 2015-06-18 – 2015-06-19 (×2): 3 mL via INTRAVENOUS

## 2015-06-17 MED ORDER — LINAGLIPTIN 5 MG PO TABS
5.0000 mg | ORAL_TABLET | Freq: Every day | ORAL | Status: DC
  Administered 2015-06-19: 5 mg via ORAL
  Filled 2015-06-17: qty 1

## 2015-06-17 MED ORDER — INSULIN ASPART 100 UNIT/ML ~~LOC~~ SOLN
0.0000 [IU] | Freq: Three times a day (TID) | SUBCUTANEOUS | Status: DC
  Administered 2015-06-19: 2 [IU] via SUBCUTANEOUS

## 2015-06-17 MED ORDER — MONTELUKAST SODIUM 10 MG PO TABS
10.0000 mg | ORAL_TABLET | Freq: Every day | ORAL | Status: DC
  Administered 2015-06-17 – 2015-06-18 (×2): 10 mg via ORAL
  Filled 2015-06-17 (×2): qty 1

## 2015-06-17 MED ORDER — SODIUM CHLORIDE 0.9 % IV SOLN: 250.0000 mL | INTRAVENOUS | Status: DC | PRN

## 2015-06-17 MED ORDER — SODIUM CHLORIDE 0.9 % IJ SOLN
3.0000 mL | Freq: Two times a day (BID) | INTRAMUSCULAR | Status: DC
  Administered 2015-06-17 – 2015-06-19 (×3): 3 mL via INTRAVENOUS

## 2015-06-17 MED ORDER — ASPIRIN EC 81 MG PO TBEC
81.0000 mg | DELAYED_RELEASE_TABLET | Freq: Every day | ORAL | Status: DC
  Administered 2015-06-17 – 2015-06-19 (×3): 81 mg via ORAL
  Filled 2015-06-17 (×3): qty 1

## 2015-06-17 MED ORDER — ACETAMINOPHEN 500 MG PO TABS
1000.0000 mg | ORAL_TABLET | ORAL | Status: DC | PRN
  Administered 2015-06-17: 1000 mg via ORAL
  Filled 2015-06-17: qty 2

## 2015-06-17 MED ORDER — PRAVASTATIN SODIUM 40 MG PO TABS
40.0000 mg | ORAL_TABLET | Freq: Every day | ORAL | Status: DC
  Administered 2015-06-17 – 2015-06-18 (×2): 40 mg via ORAL
  Filled 2015-06-17 (×2): qty 1

## 2015-06-17 MED ORDER — PANTOPRAZOLE SODIUM 40 MG PO TBEC
40.0000 mg | DELAYED_RELEASE_TABLET | Freq: Every day | ORAL | Status: DC
  Administered 2015-06-17 – 2015-06-19 (×3): 40 mg via ORAL
  Filled 2015-06-17 (×3): qty 1

## 2015-06-17 MED ORDER — INSULIN DETEMIR 100 UNIT/ML ~~LOC~~ SOLN
6.0000 [IU] | Freq: Every day | SUBCUTANEOUS | Status: DC
  Filled 2015-06-17 (×2): qty 0.06

## 2015-06-17 MED ORDER — FEBUXOSTAT 40 MG PO TABS
40.0000 mg | ORAL_TABLET | Freq: Every day | ORAL | Status: DC
  Administered 2015-06-17 – 2015-06-19 (×3): 40 mg via ORAL
  Filled 2015-06-17 (×3): qty 1

## 2015-06-17 MED ORDER — FUROSEMIDE 40 MG PO TABS
40.0000 mg | ORAL_TABLET | Freq: Every day | ORAL | Status: DC
  Administered 2015-06-17 – 2015-06-19 (×2): 40 mg via ORAL
  Filled 2015-06-17 (×2): qty 1

## 2015-06-17 NOTE — ED Notes (Signed)
Pt here from home with c/o syncopal episode , pt  Heart rate was in the 30's upon ems arrival pt in the 70's upon arrival to ED no chest pain no sob

## 2015-06-17 NOTE — H&P (Addendum)
Tricia Gonzales is an 74 y.o. female.   Chief Complaint: Syncope, bradycardia HPI: Tricia Gonzales  is a 74 y.o. female with a history of HTN, DM II, obesity, and Sjogren's syndrome who presented after syncopal episode. She was sitting at her kitchen table and speaking to her sister and felt a "funny feeling" in her head and then woke up on the floor. Her sister called EMS. Denies any injury.  EMS on arrival found her to be markedly bradycardic per their EKG with rates in the 30s and episodes of junctional rhythm. HR had improved to 70s on arrival to ED. Denied any CP, palpitations, or SOB. Had a similar episode 3 years ago with similar findings. Pt was on metoprolol at that time which was discontinued.   She also reports 2 near syncope/syncopal episodes in 1 night two months ago, first episode occurred after waking and sitting on the side of the bed, suddenly felt lightheaded and fell backwards, second episode occurred while on the toilet the same day.  No further symptoms since admission. She states that she is presently feeling well. Denies any exertional chest pain, denies any exertional dyspnea although on further questioning states that she gets mildly dyspneic on extremes of activity.  Past Medical History  Diagnosis Date  . Diabetes mellitus   . Hypertension   . Obesity   . Sjogren's disease ([place])   . Hyperlipidemia   . Anemia   . Diverticulosis   . Osteoarthritis   . Intestinal obstruction ([place])   . Hiatal hernia   . Esophageal stricture   . Vitamin B12 deficiency     Past Surgical History  Procedure Laterality Date  . Joint replacement      knee x 3  . Abdominal hysterectomy    . Colon resection      for rectovaginal fistula  . Cholecystectomy    . Colonoscopy  01/07/2012    Procedure: COLONOSCOPY;  Surgeon: Lafayette Dragon, MD;  Location: [facility];  Service: Endoscopy;  Laterality: N/A;    Family History  Problem Relation Age of Onset  . Diabetes Mother   . Diabetes  Brother   . Colon cancer Neg Hx   . Hypertension Sister    Social History:  reports that she quit smoking about 32 years ago. She has never used smokeless tobacco. She reports that she drinks alcohol. She reports that she does not use illicit drugs.  Allergies:  Allergies  Allergen Reactions  . Norvasc [Amlodipine Besylate] Shortness Of Breath  . Glucophage [Metformin Hydrochloride] Other (See Comments)    "increases creatinine"  . Lisinopril     Headache   . Losartan     Headache   . Metoprolol Other (See Comments)    Passed out  . Morphine And Related Other (See Comments)    "hallucinations"  . Simvastatin Other (See Comments)    Body aches  . Trulicity [Dulaglutide] Other (See Comments)    Severe mood swings    Review of Systems - History obtained from the patient General ROS: negative for - chills, fever, malaise or night sweats Respiratory ROS: Mild chronic shortness of breath on extreme exertion. Denies PND or orthopnea. Cardiovascular ROS: negative for - chest pain, edema, irregular heartbeat, orthopnea or palpitations Neurological ROS: positive for  syncope negative for - confusion, neurologic deficit, memory loss, seizures or visual changes  Blood pressure 145/56, pulse 74, temperature 97.9 F (36.6 C), temperature source Oral, resp. rate 14, height _0  (1.499 m),  weight 112.764 kg (248 lb 9.6 oz), SpO2 96 %. Body mass index is 50.18 kg/(m^2).   General appearance: alert, cooperative, no distress and morbidly obese Eyes: negative Neck: no adenopathy, no carotid bruit, no JVD, supple, symmetrical, trachea midline and thyroid not enlarged, symmetric, no tenderness/mass/nodules Short neck.  Resp: clear to auscultation bilaterally Chest wall: no tenderness Cardio: S1, S2 normal, no S3 or S4 and systolic murmur: early systolic 2/6, crescendo at 2nd right intercostal space GI: soft, non-tender; bowel sounds normal; no masses,  no organomegaly Extremities:  extremities normal, atraumatic, no cyanosis or edema Pulses: 2+ and symmetric in the extremities, femoral pulses are deep but palpable. Pedal pulses 2+. Bilateral carotid artery bruit present. Skin: Skin color, texture, turgor normal. No rashes or lesions Neurologic: Grossly normal  Results for orders placed or performed during the hospital encounter of 06/17/15 (from the past 48 hour(s))  CBC     Status: Abnormal   Collection Time: 06/17/15 11:52 AM  Result Value Ref Range   WBC 7.4 4.0 - 10.5 K/uL   RBC 3.16 (L) 3.87 - 5.11 MIL/uL   Hemoglobin 8.2 (L) 12.0 - 15.0 g/dL   HCT 28.0 (L) 36.0 - 46.0 %   MCV 88.6 78.0 - 100.0 fL   MCH 25.9 (L) 26.0 - 34.0 pg   MCHC 29.3 (L) 30.0 - 36.0 g/dL   RDW 14.6 11.5 - 15.5 %   Platelets 295 150 - 400 K/uL  Comprehensive metabolic panel     Status: Abnormal   Collection Time: 06/17/15 11:52 AM  Result Value Ref Range   Sodium 141 135 - 145 mmol/L   Potassium 5.0 3.5 - 5.1 mmol/L   Chloride 108 101 - 111 mmol/L   CO2 24 22 - 32 mmol/L   Glucose, Bld 236 (H) 65 - 99 mg/dL   BUN 55 (H) 6 - 20 mg/dL   Creatinine, Ser 1.89 (H) 0.44 - 1.00 mg/dL   Calcium 8.7 (L) 8.9 - 10.3 mg/dL   Total Protein 6.5 6.5 - 8.1 g/dL   Albumin 3.4 (L) 3.5 - 5.0 g/dL   AST 24 15 - 41 U/L   ALT 17 14 - 54 U/L   Alkaline Phosphatase 53 38 - 126 U/L   Total Bilirubin 0.4 0.3 - 1.2 mg/dL   GFR calc non Af Amer 25 (L) >60 mL/min   GFR calc Af Amer 29 (L) >60 mL/min    Comment: (NOTE) The eGFR has been calculated using the CKD EPI equation. This calculation has not been validated in all clinical situations. eGFR's persistently <60 mL/min signify possible Chronic Kidney Disease.    Anion gap 9 5 - 15  Troponin I     Status: None   Collection Time: 06/17/15 11:52 AM  Result Value Ref Range   Troponin I <0.03 <0.031 ng/mL    Comment:        NO INDICATION OF MYOCARDIAL INJURY.   Glucose, capillary     Status: Abnormal   Collection Time: 06/17/15  4:39 PM  Result  Value Ref Range   Glucose-Capillary 108 (H) 65 - 99 mg/dL   No results found.  Labs:   Lab Results  Component Value Date   WBC 7.4 06/17/2015   HGB 8.2* 06/17/2015   HCT 28.0* 06/17/2015   MCV 88.6 06/17/2015   PLT 295 06/17/2015    Recent Labs Lab 06/17/15 1152  NA 141  K 5.0  CL 108  CO2 24  BUN 55*  CREATININE 1.89*  CALCIUM 8.7*  PROT 6.5  BILITOT 0.4  ALKPHOS 53  ALT 17  AST 24  GLUCOSE 236*   HEMOGLOBIN A1C Lab Results  Component Value Date   HGBA1C 7.8* 09/15/2011   MPG 177* 09/15/2011     Recent Labs  06/17/15 1152  TROPONINI <0.03    Recent Labs  03/22/15 1406  TSH 3.10    EKG:  Tracings from the EMS reveals what appears to be paroxysmal episodes of sinus arrest, difficult to state due to baseline artifact, first-degree AV block.  EKG 06/17/2015: Sinus rhythm with first-degree AV block at a rate of 70 bpm, right axis deviation, left posterior fascicular block, right bundle branch block. No evidence of ischemia. Trifascicular block, abnormal EKG.  First EKG on arrival to the emergency room reveals similar findings as above but one episode of Mobitz type II AV block.  Medications Prior to Admission  Medication Sig Dispense Refill  . ACCU-CHEK AVIVA PLUS test strip 1 each by Other route daily.     Marland Kitchen acetaminophen (TYLENOL) 500 MG tablet Take 1,500 mg by mouth at bedtime.     Marland Kitchen aspirin EC 81 MG tablet Take 81 mg by mouth daily.    . cholecalciferol (VITAMIN D) 1000 UNITS tablet Take 2,000 Units by mouth at bedtime.    Marland Kitchen denosumab (PROLIA) 60 MG/ML SOLN injection Inject 60 mg into the skin every 6 (six) months. Administer in upper arm, thigh, or abdomen    . docusate sodium (COLACE) 100 MG capsule Take 100 mg by mouth every other day.    . doxazosin (CARDURA) 4 MG tablet Take 4 mg by mouth daily.    . furosemide (LASIX) 40 MG tablet Take 40 mg by mouth daily.    Marland Kitchen gabapentin (NEURONTIN) 300 MG capsule Take 300-600 mg by mouth 2 (two) times  daily. Pt takes 1 capsule in the am & 2 capsules in the pm    . glipiZIDE (GLUCOTROL XL) 10 MG 24 hr tablet Take 10 mg by mouth 2 (two) times daily.    Marland Kitchen HYDROcodone-acetaminophen (NORCO/VICODIN) 5-325 MG tablet Take 1 tablet by mouth every 6 (six) hours as needed for moderate pain.    . hydroxychloroquine (PLAQUENIL) 200 MG tablet Take 400 mg by mouth daily.     . irbesartan (AVAPRO) 300 MG tablet Take 300 mg by mouth daily.    . montelukast (SINGULAIR) 10 MG tablet Take 10 mg by mouth at bedtime.     . Multiple Vitamin (MULITIVITAMIN WITH MINERALS) TABS Take 1 tablet by mouth daily.    . pantoprazole (PROTONIX) 40 MG tablet Take 40 mg by mouth daily.    . pentoxifylline (TRENTAL) 400 MG CR tablet Take 400 mg by mouth daily.    . pravastatin (PRAVACHOL) 40 MG tablet Take 40 mg by mouth daily.     . sitaGLIPtin (JANUVIA) 50 MG tablet Take 50 mg by mouth daily.    Marland Kitchen ULORIC 40 MG tablet Take 40 mg by mouth daily.       Current facility-administered medications:  .  0.9 %  sodium chloride infusion, 250 mL, Intravenous, PRN, Adrian Prows, MD .  aspirin EC tablet 81 mg, 81 mg, Oral, Daily, Adrian Prows, MD, 81 mg at 06/17/15 1711 .  febuxostat (ULORIC) tablet 40 mg, 40 mg, Oral, Daily, Adrian Prows, MD, 40 mg at 06/17/15 1711 .  furosemide (LASIX) tablet 40 mg, 40 mg, Oral, Daily, Adrian Prows, MD, 40 mg at 06/17/15 1711 .  glipiZIDE (GLUCOTROL XL)  24 hr tablet 10 mg, 10 mg, Oral, BID WC, Adrian Prows, MD, 10 mg at 06/17/15 1711 .  heparin injection 5,000 Units, 5,000 Units, Subcutaneous, 3 times per day, Adrian Prows, MD, 5,000 Units at 06/17/15 1400 .  hydroxychloroquine (PLAQUENIL) tablet 400 mg, 400 mg, Oral, Daily, Adrian Prows, MD, 400 mg at 06/17/15 1711 .  insulin aspart (novoLOG) injection 0-15 Units, 0-15 Units, Subcutaneous, TID WC, Adrian Prows, MD, 0 Units at 06/17/15 1700 .  insulin detemir (LEVEMIR) injection 6 Units, 6 Units, Subcutaneous, QHS, Adrian Prows, MD .  irbesartan (AVAPRO) tablet 150 mg, 150 mg,  Oral, Daily, Adrian Prows, MD, 150 mg at 06/17/15 1711 .  linagliptin (TRADJENTA) tablet 5 mg, 5 mg, Oral, Daily, Adrian Prows, MD, 5 mg at 06/17/15 1600 .  montelukast (SINGULAIR) tablet 10 mg, 10 mg, Oral, QHS, Adrian Prows, MD .  pantoprazole (PROTONIX) EC tablet 40 mg, 40 mg, Oral, Daily, Adrian Prows, MD, 40 mg at 06/17/15 1711 .  [START ON 06/18/2015] pioglitazone (ACTOS) tablet 45 mg, 45 mg, Oral, Q breakfast, Adrian Prows, MD .  pravastatin (PRAVACHOL) tablet 40 mg, 40 mg, Oral, q1800, Adrian Prows, MD, 40 mg at 06/17/15 1711 .  sodium chloride 0.9 % injection 3 mL, 3 mL, Intravenous, Q12H, Adrian Prows, MD, 0 mL at 06/17/15 1636 .  sodium chloride 0.9 % injection 3 mL, 3 mL, Intravenous, Q12H, Adrian Prows, MD, 0 mL at 06/17/15 1637 .  sodium chloride 0.9 % injection 3 mL, 3 mL, Intravenous, PRN, Adrian Prows, MD  Assessment/Plan 1. Syncope and collapse secondary to high degree AV nodal disease, probable sick sinus syndrome. Please see abnormal EKG findings above. 2. Morbid obesity 3. Type II DM, previously uncontrolled, HbA1c pending. 4. Essential Hypertension 5. Sjogren's disease 6. Bilateral carotid bruit needs f/u.  Recommendation: Symptomatic bradycardia,  Discontinue doxazosin to ensure no negative chronotropic agents although this may not be contributing to her presentation. I will keep her nothing by mouth and ask EP evaluation for tomorrow for consideration for permanent pacemaker implantation due to trifascicular block, symptomatic bradycardia, history consistent with what appears to be sick sinus syndrome and also underlying high degree AV block. Patient would like to follow up with Dr. Golden Hurter who she had seen 3 years ago, I will request them to take over tomorrow morning.    Adrian Prows, MD 06/17/2015, 6:43 PM [facility] [place] Pager: [phone] Office: [phone] If no answer: Cell:  [phone]

## 2015-06-17 NOTE — ED Provider Notes (Signed)
CSN: [id number]     Arrival date & time 06/17/15  1130 History   First MD Initiated Contact with Patient 06/17/15 1139     Chief Complaint  Patient presents with  . Loss of Consciousness     (Consider location/radiation/quality/duration/timing/severity/associated sxs/prior Treatment) Patient is a 74 y.o. female presenting with syncope. The history is provided by the patient and the EMS personnel.  Loss of Consciousness Associated symptoms: no chest pain, no confusion, no fever, no headaches, no palpitations, no shortness of breath and no vomiting   Patient presents via ems after syncopal event today.  Was sitting, at home, on phone, began to feel faint, lightheaded.  Briefly passed out, awoke on floor. Denies injury.  States hx same approximately 3 yrs ago. Denies any chest pain or discomfort. No palpitations. No sob or unusual doe. No fever or chills. States recent health at baseline. No recent change in meds.  Denies any recent blood loss or melena.       Past Medical History  Diagnosis Date  . Diabetes mellitus   . Hypertension   . Obesity   . Sjogren's disease ([place])   . Hyperlipidemia   . Anemia   . Diverticulosis   . Osteoarthritis   . Intestinal obstruction ([place])   . Hiatal hernia   . Esophageal stricture   . Vitamin B12 deficiency    Past Surgical History  Procedure Laterality Date  . Joint replacement      knee x 3  . Abdominal hysterectomy    . Colon resection      for rectovaginal fistula  . Cholecystectomy    . Colonoscopy  01/07/2012    Procedure: COLONOSCOPY;  Surgeon: Lafayette Dragon, MD;  Location: [facility];  Service: Endoscopy;  Laterality: N/A;   Family History  Problem Relation Age of Onset  . Diabetes Mother   . Diabetes Brother   . Colon cancer Neg Hx   . Hypertension Sister    Social History  Substance Use Topics  . Smoking status: Former Smoker    Quit date: 06/23/1983  . Smokeless tobacco: Never Used  . Alcohol Use: Yes     Comment:  occasionally   OB History    No data available     Review of Systems  Constitutional: Negative for fever and chills.  HENT: Negative for sore throat.   Eyes: Negative for redness.  Respiratory: Negative for shortness of breath.   Cardiovascular: Positive for syncope. Negative for chest pain, palpitations and leg swelling.  Gastrointestinal: Negative for vomiting, abdominal pain and blood in stool.  Genitourinary: Negative for flank pain.  Musculoskeletal: Negative for back pain and neck pain.  Skin: Negative for rash.  Neurological: Positive for syncope. Negative for headaches.  Hematological: Does not bruise/bleed easily.  Psychiatric/Behavioral: Negative for confusion.      Allergies  Norvasc; Glucophage; Lisinopril; Losartan; Metoprolol; Morphine and related; Simvastatin; and Trulicity  Home Medications   Prior to Admission medications   Medication Sig Start Date End Date Taking? Authorizing Provider  ACCU-CHEK AVIVA PLUS test strip 1 each by Other route daily.  03/15/15   Historical Provider, MD  acetaminophen (TYLENOL) 500 MG tablet Take 1,500 mg by mouth at bedtime.     Historical Provider, MD  aspirin EC 81 MG tablet Take 81 mg by mouth daily.    Historical Provider, MD  cholecalciferol (VITAMIN D) 1000 UNITS tablet Take 2,000 Units by mouth at bedtime.    Historical Provider, MD  doxazosin (CARDURA) 4  MG tablet Take 4 mg by mouth daily.    Historical Provider, MD  furosemide (LASIX) 40 MG tablet Take 40 mg by mouth daily.    Historical Provider, MD  gabapentin (NEURONTIN) 300 MG capsule Take 300-600 mg by mouth 2 (two) times daily. Pt takes 1 capsule in the am & 2 capsules in the pm    Historical Provider, MD  glipiZIDE (GLUCOTROL XL) 10 MG 24 hr tablet Take 10 mg by mouth 2 (two) times daily.    Historical Provider, MD  hydroxychloroquine (PLAQUENIL) 200 MG tablet Take 400 mg by mouth daily.  03/16/15   Historical Provider, MD  irbesartan (AVAPRO) 150 MG tablet Take 150  mg by mouth daily.     Historical Provider, MD  montelukast (SINGULAIR) 10 MG tablet Take 10 mg by mouth at bedtime.     Historical Provider, MD  Multiple Vitamin (MULITIVITAMIN WITH MINERALS) TABS Take 1 tablet by mouth daily.    Historical Provider, MD  pantoprazole (PROTONIX) 40 MG tablet Take 40 mg by mouth daily.    Historical Provider, MD  pentoxifylline (TRENTAL) 400 MG CR tablet Take 400 mg by mouth daily.    Historical Provider, MD  pioglitazone (ACTOS) 45 MG tablet Take 45 mg by mouth daily.    Historical Provider, MD  pravastatin (PRAVACHOL) 40 MG tablet  03/16/15   Historical Provider, MD  sitaGLIPtin (JANUVIA) 100 MG tablet Take 100 mg by mouth daily.    Historical Provider, MD  ULORIC 40 MG tablet  03/15/15   Historical Provider, MD   BP 144/48 mmHg  Pulse 73  Temp(Src) 97.7 F (36.5 C) (Oral)  Resp 18  Ht _0  (1.499 m)  Wt 114.306 kg  BMI 50.87 kg/m2  SpO2 98% Physical Exam  Constitutional: She is oriented to person, place, and time. She appears well-developed and well-nourished. No distress.  HENT:  Head: Atraumatic.  Mouth/Throat: Oropharynx is clear and moist.  Eyes: Conjunctivae are normal. Pupils are equal, round, and reactive to light. No scleral icterus.  Neck: Neck supple. No tracheal deviation present. No thyromegaly present.  Cardiovascular: Normal rate, normal heart sounds and intact distal pulses.  Exam reveals no gallop and no friction rub.   No murmur heard. Pulmonary/Chest: Effort normal and breath sounds normal. No respiratory distress. She exhibits no tenderness.  Abdominal: Soft. Normal appearance and bowel sounds are normal. She exhibits no distension. There is no tenderness.  No puls mass  Genitourinary:  No cva tenderness  Musculoskeletal: She exhibits no edema.  CTLS spine, non tender, aligned, no step off. Good rom bil ext, no focal bony tenderness  Neurological: She is alert and oriented to person, place, and time.  Motor intact bil. stre  5/5. sens grossly intact.   Skin: Skin is warm and dry. No rash noted. She is not diaphoretic.  Psychiatric: She has a normal mood and affect.  Nursing note and vitals reviewed.   ED Course  Procedures (including critical care time) Labs Review   Results for orders placed or performed during the hospital encounter of 06/17/15  MRSA PCR Screening  Result Value Ref Range   MRSA by PCR NEGATIVE NEGATIVE  CBC  Result Value Ref Range   WBC 7.4 4.0 - 10.5 K/uL   RBC 3.16 (L) 3.87 - 5.11 MIL/uL   Hemoglobin 8.2 (L) 12.0 - 15.0 g/dL   HCT 28.0 (L) 36.0 - 46.0 %   MCV 88.6 78.0 - 100.0 fL   MCH 25.9 (L) 26.0 -  34.0 pg   MCHC 29.3 (L) 30.0 - 36.0 g/dL   RDW 14.6 11.5 - 15.5 %   Platelets 295 150 - 400 K/uL  Comprehensive metabolic panel  Result Value Ref Range   Sodium 141 135 - 145 mmol/L   Potassium 5.0 3.5 - 5.1 mmol/L   Chloride 108 101 - 111 mmol/L   CO2 24 22 - 32 mmol/L   Glucose, Bld 236 (H) 65 - 99 mg/dL   BUN 55 (H) 6 - 20 mg/dL   Creatinine, Ser 1.89 (H) 0.44 - 1.00 mg/dL   Calcium 8.7 (L) 8.9 - 10.3 mg/dL   Total Protein 6.5 6.5 - 8.1 g/dL   Albumin 3.4 (L) 3.5 - 5.0 g/dL   AST 24 15 - 41 U/L   ALT 17 14 - 54 U/L   Alkaline Phosphatase 53 38 - 126 U/L   Total Bilirubin 0.4 0.3 - 1.2 mg/dL   GFR calc non Af Amer 25 (L) >60 mL/min   GFR calc Af Amer 29 (L) >60 mL/min   Anion gap 9 5 - 15  Troponin I  Result Value Ref Range   Troponin I <0.03 <0.031 ng/mL  Urinalysis, Routine w reflex microscopic (not at [facility])  Result Value Ref Range   Color, Urine YELLOW YELLOW   APPearance CLEAR CLEAR   Specific Gravity, Urine 1.020 1.005 - 1.030   pH 5.0 5.0 - 8.0   Glucose, UA NEGATIVE NEGATIVE mg/dL   Hgb urine dipstick NEGATIVE NEGATIVE   Bilirubin Urine NEGATIVE NEGATIVE   Ketones, ur NEGATIVE NEGATIVE mg/dL   Protein, ur NEGATIVE NEGATIVE mg/dL   Nitrite NEGATIVE NEGATIVE   Leukocytes, UA SMALL (A) NEGATIVE  Hemoglobin A1c  Result Value Ref Range   Hgb A1c MFr Bld  8.2 (H) 4.8 - 5.6 %   Mean Plasma Glucose 189 mg/dL  Glucose, capillary  Result Value Ref Range   Glucose-Capillary 108 (H) 65 - 99 mg/dL  Basic metabolic panel  Result Value Ref Range   Sodium 142 135 - 145 mmol/L   Potassium 4.6 3.5 - 5.1 mmol/L   Chloride 108 101 - 111 mmol/L   CO2 27 22 - 32 mmol/L   Glucose, Bld 159 (H) 65 - 99 mg/dL   BUN 51 (H) 6 - 20 mg/dL   Creatinine, Ser 1.67 (H) 0.44 - 1.00 mg/dL   Calcium 8.6 (L) 8.9 - 10.3 mg/dL   GFR calc non Af Amer 29 (L) >60 mL/min   GFR calc Af Amer 34 (L) >60 mL/min   Anion gap 7 5 - 15  Glucose, capillary  Result Value Ref Range   Glucose-Capillary 156 (H) 65 - 99 mg/dL  Urine microscopic-add on  Result Value Ref Range   Squamous Epithelial / LPF 0-5 (A) NONE SEEN   WBC, UA 0-5 0 - 5 WBC/hpf   RBC / HPF NONE SEEN 0 - 5 RBC/hpf   Bacteria, UA RARE (A) NONE SEEN   Casts HYALINE CASTS (A) NEGATIVE  Glucose, capillary  Result Value Ref Range   Glucose-Capillary 164 (H) 65 - 99 mg/dL  Glucose, capillary  Result Value Ref Range   Glucose-Capillary 107 (H) 65 - 99 mg/dL  Glucose, capillary  Result Value Ref Range   Glucose-Capillary 183 (H) 65 - 99 mg/dL  Glucose, capillary  Result Value Ref Range   Glucose-Capillary 152 (H) 65 - 99 mg/dL  Glucose, capillary  Result Value Ref Range   Glucose-Capillary 162 (H) 65 - 99 mg/dL  Dg Chest 2 View  06/19/2015  CLINICAL DATA:  Pacemaker placement. EXAM: CHEST  2 VIEW COMPARISON:  06/18/2015 FINDINGS: Left-sided dual chamber cardiac pacemaker has been placed. Leads in the region of the right atrium and right ventricle. Negative for a pneumothorax. Heart size is enlarged but unchanged. Atherosclerotic calcifications at the aortic arch. Coarse lung markings without focal airspace disease. No pleural effusions. Degenerative changes in the mid and lower thoracic spine. IMPRESSION: Placement of cardiac pacemaker without pneumothorax. Electronically Signed   By: Markus Daft M.D.   On:  06/19/2015 07:44   Dg Chest 2 View  06/18/2015  CLINICAL DATA:  Syncope, preop EXAM: CHEST  2 VIEW COMPARISON:  CT chest dated 08/17/2014 FINDINGS: Chronic interstitial markings. No focal consolidation. No pleural effusion or pneumothorax. Cardiomegaly. Degenerative changes of the visualized thoracolumbar spine. Cholecystectomy clips. IMPRESSION: No evidence of acute cardiopulmonary disease. Electronically Signed   By: Julian Hy M.D.   On: 06/18/2015 09:51       I have personally reviewed and evaluated these images and lab results as part of my medical decision-making.   EKG Interpretation   Date/Time:  Monday June 17 2015 11:42:21 EST Ventricular Rate:  70 PR Interval:  52 QRS Duration: 166 QT Interval:  485 QTC Calculation: 523 R Axis:   106 Text Interpretation:  Sinus rhythm Sinus pause RBBB and LPFB Confirmed by  Ashok Cordia  MD, Lennette Bihari ([id number]) on 06/17/2015 12:06:43 PM      MDM   Iv ns. Continuous pulse ox and monitor. o2 [place].   Labs.   Reviewed nursing notes and prior charts for additional history.   On review old charts, from 2013, admission w syncope.  At that time patient in bradycardic rhythm, rate in 20-30 range - decision made then to hold b blocker, no pacemaker placed.    Given recurrent bradycardia, syncope, and not currently on b blocker, will consult cardiology re admission and eval for pacemaker.       EMS ecg reviewed, ?2nd deg hb type 2.  Cardiology consulted for admission, pacemaker eval.     Lajean Saver, MD 06/20/15 [phone]

## 2015-06-17 NOTE — Plan of Care (Signed)
Problem: Education: Goal: Knowledge of [facility] Education information/materials will improve Outcome: Completed/Met Date Met:  06/17/15 Pt educated about unit policies and expectations. Family at bedside and also made aware.

## 2015-06-17 NOTE — ED Notes (Signed)
Attempted report

## 2015-06-18 ENCOUNTER — Encounter (HOSPITAL_COMMUNITY)
Admission: EM | Disposition: A | Discharge status: Home / Self Care | Payer: Self-pay | Source: Home / Self Care | Attending: Cardiology

## 2015-06-18 ENCOUNTER — Inpatient Hospital Stay (HOSPITAL_COMMUNITY): Payer: Medicare Other

## 2015-06-18 DIAGNOSIS — I442 Atrioventricular block, complete: Secondary | ICD-10-CM

## 2015-06-18 DIAGNOSIS — I441 Atrioventricular block, second degree: Secondary | ICD-10-CM

## 2015-06-18 HISTORY — PX: EP IMPLANTABLE DEVICE: SHX172B

## 2015-06-18 LAB — BASIC METABOLIC PANEL
Anion gap: 7 (ref 5–15)
BUN: 51 mg/dL — ABNORMAL HIGH (ref 6–20)
CO2: 27 mmol/L (ref 22–32)
Calcium: 8.6 mg/dL — ABNORMAL LOW (ref 8.9–10.3)
Chloride: 108 mmol/L (ref 101–111)
Creatinine, Ser: 1.67 mg/dL — ABNORMAL HIGH (ref 0.44–1.00)
GFR calc Af Amer: 34 mL/min — ABNORMAL LOW (ref >60)
GFR calc non Af Amer: 29 mL/min — ABNORMAL LOW (ref >60)
Glucose, Bld: 159 mg/dL — ABNORMAL HIGH (ref 65–99)
Potassium: 4.6 mmol/L (ref 3.5–5.1)
Sodium: 142 mmol/L (ref 135–145)

## 2015-06-18 LAB — HEMOGLOBIN A1C
Hgb A1c MFr Bld: 8.2 % — ABNORMAL HIGH (ref 4.8–5.6)
Mean Plasma Glucose: 189 mg/dL

## 2015-06-18 LAB — MRSA PCR SCREENING: MRSA by PCR: NEGATIVE

## 2015-06-18 LAB — GLUCOSE, CAPILLARY
Glucose-Capillary: 164 mg/dL — ABNORMAL HIGH (ref 65–99)
Glucose-Capillary: 107 mg/dL — ABNORMAL HIGH (ref 65–99)
Glucose-Capillary: 183 mg/dL — ABNORMAL HIGH (ref 65–99)

## 2015-06-18 LAB — URINALYSIS, ROUTINE W REFLEX MICROSCOPIC
Bilirubin Urine: NEGATIVE
Glucose, UA: NEGATIVE mg/dL
Hgb urine dipstick: NEGATIVE
Ketones, ur: NEGATIVE mg/dL
Nitrite: NEGATIVE
Protein, ur: NEGATIVE mg/dL
Specific Gravity, Urine: 1.02 (ref 1.005–1.030)
pH: 5 (ref 5.0–8.0)

## 2015-06-18 LAB — URINE MICROSCOPIC-ADD ON: RBC / HPF: NONE SEEN RBC/hpf (ref 0–5)

## 2015-06-18 SURGERY — PACEMAKER IMPLANT

## 2015-06-18 MED ORDER — HEPARIN (PORCINE) IN NACL 2-0.9 UNIT/ML-% IJ SOLN
INTRAMUSCULAR | Status: AC
  Filled 2015-06-18: qty 500

## 2015-06-18 MED ORDER — FENTANYL CITRATE (PF) 100 MCG/2ML IJ SOLN
INTRAMUSCULAR | Status: AC
  Filled 2015-06-18: qty 2

## 2015-06-18 MED ORDER — IRBESARTAN 150 MG PO TABS: 300.0000 mg | ORAL_TABLET | Freq: Every day | ORAL | Status: DC

## 2015-06-18 MED ORDER — CHLORHEXIDINE GLUCONATE 4 % EX LIQD
60.0000 mL | Freq: Once | CUTANEOUS | Status: AC
  Administered 2015-06-18: 4 via TOPICAL
  Filled 2015-06-18: qty 30

## 2015-06-18 MED ORDER — MIDAZOLAM HCL 5 MG/5ML IJ SOLN
INTRAMUSCULAR | Status: AC
  Filled 2015-06-18: qty 5

## 2015-06-18 MED ORDER — SODIUM CHLORIDE 0.9 % IV SOLN
INTRAVENOUS | Status: DC
  Administered 2015-06-18: 12:00:00 via INTRAVENOUS

## 2015-06-18 MED ORDER — HEPARIN (PORCINE) IN NACL 2-0.9 UNIT/ML-% IJ SOLN
INTRAMUSCULAR | Status: DC | PRN
  Administered 2015-06-18: 15:00:00

## 2015-06-18 MED ORDER — CEFAZOLIN SODIUM-DEXTROSE 2-3 GM-% IV SOLR
2.0000 g | INTRAVENOUS | Status: AC
  Administered 2015-06-18: 2 g via INTRAVENOUS

## 2015-06-18 MED ORDER — SODIUM CHLORIDE 0.9 % IV SOLN: 250.0000 mL | INTRAVENOUS | Status: DC

## 2015-06-18 MED ORDER — HYDROCODONE-ACETAMINOPHEN 5-325 MG PO TABS
1.0000 | ORAL_TABLET | Freq: Four times a day (QID) | ORAL | Status: DC | PRN
  Administered 2015-06-18 – 2015-06-19 (×2): 1 via ORAL
  Filled 2015-06-18 (×3): qty 1

## 2015-06-18 MED ORDER — FENTANYL CITRATE (PF) 100 MCG/2ML IJ SOLN
INTRAMUSCULAR | Status: DC | PRN
  Administered 2015-06-18 (×4): 12.5 ug via INTRAVENOUS
  Administered 2015-06-18: 25 ug via INTRAVENOUS
  Administered 2015-06-18 (×2): 12.5 ug via INTRAVENOUS

## 2015-06-18 MED ORDER — CEFAZOLIN SODIUM 1-5 GM-% IV SOLN
1.0000 g | Freq: Four times a day (QID) | INTRAVENOUS | Status: AC
  Administered 2015-06-18 – 2015-06-19 (×3): 1 g via INTRAVENOUS
  Filled 2015-06-18 (×4): qty 50

## 2015-06-18 MED ORDER — GENTAMICIN SULFATE 40 MG/ML IJ SOLN
INTRAMUSCULAR | Status: AC
  Filled 2015-06-18: qty 2

## 2015-06-18 MED ORDER — SODIUM CHLORIDE 0.9 % IJ SOLN: 3.0000 mL | Freq: Two times a day (BID) | INTRAMUSCULAR | Status: DC

## 2015-06-18 MED ORDER — DENOSUMAB 60 MG/ML ~~LOC~~ SOLN: 60.0000 mg | SUBCUTANEOUS | Status: DC

## 2015-06-18 MED ORDER — SODIUM CHLORIDE 0.9 % IR SOLN
80.0000 mg | Status: AC
  Administered 2015-06-18: 80 mg

## 2015-06-18 MED ORDER — GABAPENTIN 300 MG PO CAPS
300.0000 mg | ORAL_CAPSULE | Freq: Every day | ORAL | Status: DC
  Administered 2015-06-19: 300 mg via ORAL
  Filled 2015-06-18: qty 1

## 2015-06-18 MED ORDER — LINAGLIPTIN 5 MG PO TABS: 5.0000 mg | ORAL_TABLET | Freq: Every day | ORAL | Status: DC

## 2015-06-18 MED ORDER — ONDANSETRON HCL 4 MG/2ML IJ SOLN: 4.0000 mg | Freq: Four times a day (QID) | INTRAMUSCULAR | Status: DC | PRN

## 2015-06-18 MED ORDER — MIDAZOLAM HCL 5 MG/5ML IJ SOLN
INTRAMUSCULAR | Status: DC | PRN
  Administered 2015-06-18 (×3): 1 mg via INTRAVENOUS
  Administered 2015-06-18: 2 mg via INTRAVENOUS
  Administered 2015-06-18 (×3): 1 mg via INTRAVENOUS

## 2015-06-18 MED ORDER — LIDOCAINE HCL (PF) 1 % IJ SOLN
INTRAMUSCULAR | Status: DC | PRN
  Administered 2015-06-18: 60 mL

## 2015-06-18 MED ORDER — SODIUM CHLORIDE 0.9 % IJ SOLN: 3.0000 mL | INTRAMUSCULAR | Status: DC | PRN

## 2015-06-18 MED ORDER — ACETAMINOPHEN 325 MG PO TABS: 325.0000 mg | ORAL_TABLET | ORAL | Status: DC | PRN

## 2015-06-18 MED ORDER — DOCUSATE SODIUM 100 MG PO CAPS
100.0000 mg | ORAL_CAPSULE | ORAL | Status: DC
  Filled 2015-06-18 (×2): qty 1

## 2015-06-18 MED ORDER — CEFAZOLIN SODIUM-DEXTROSE 2-3 GM-% IV SOLR
INTRAVENOUS | Status: AC
  Filled 2015-06-18: qty 50

## 2015-06-18 MED ORDER — GABAPENTIN 300 MG PO CAPS
600.0000 mg | ORAL_CAPSULE | Freq: Every day | ORAL | Status: DC
  Administered 2015-06-18: 600 mg via ORAL
  Filled 2015-06-18: qty 2

## 2015-06-18 SURGICAL SUPPLY — 7 items
CABLE SURGICAL S-101-97-12 (CABLE) ×2 IMPLANT
LEAD CAPSURE NOVUS 45CM (Lead) ×2 IMPLANT
LEAD CAPSURE NOVUS 5076-52CM (Lead) ×2 IMPLANT
PAD DEFIB LIFELINK (PAD) ×2 IMPLANT
PPM ADVISA MRI DR A2DR01 (Pacemaker) ×2 IMPLANT
SHEATH CLASSIC 7F (SHEATH) ×4 IMPLANT
TRAY PACEMAKER INSERTION (PACKS) ×2 IMPLANT

## 2015-06-18 NOTE — H&P (View-Only) (Signed)
ELECTROPHYSIOLOGY CONSULT NOTE    Patient ID: [name] MRN: [id number], DOB/AGE: Feb 16, 1941 74 y.o.  Admit date: 06/17/2015 Date of Consult: 06/18/2015   Primary Physician: Reginia Naas, MD Primary Cardiologist: Dr. Einar Gip  Reason for Consultation: syncope, bradycardia  HPI: Tricia Gonzales is a 74 y.o. female who suffered a syncopal event at home.  She was seated at the time, eating breakfast and talking on the phone with her sister when she got a "feeling" in her head and woke on the floor.  She estimates the time from fainting to waking as a few seconds only.  She did not ask her sister, but the call had dropped and when she called her back and told her she had fainted her sister called 18.  The patient states she felt weak, but otherwise fine.  She had no CP, palpitations or SOB pre or post syncope. When EMS arrived her HR was in the 30's, upon arrival to ER the 70's.  Unknown vitals otherwise by EMS.  The patient reports 3 years ago with the prior syncopal event noted in her record that the event happened the same way, at that time also found bradycardiac and her metoprolol stopped.  Since then she has been well from a cardiac stand point.   In the last 6 months or so though a few very fleeting dizzy spells.  And about 2 months ago she woke in the middle of the night feeling very weak like she could hardly move and she sat at the edge of the bed, at that time feels like she blacked out a second, she did not seek attention for this.  She denies trauma with yesterday's event or the prior.  Her PMHx is that of morbid obesity, HTN, DM, osteoporosis and OA, currently being evaluated by ortho for possible stress fracture LLE, Sjogren's syndrome, CRI and chronic anemia she reports seeing a hematologist for some years ago without clear findings for her anemia.    She is feeling well this morning, without any CP, palpitations or SOB.  She does get winded with increased exertion, this  observed over the last few years and has attributed it to her weight an lack of conditioning over the years, noting no acute changes in her exertional capacity.  Past Medical History  Diagnosis Date  . Diabetes mellitus   . Hypertension   . Obesity   . Sjogren's disease ([place])   . Hyperlipidemia   . Anemia   . Diverticulosis   . Osteoarthritis   . Intestinal obstruction ([place])   . Hiatal hernia   . Esophageal stricture   . Vitamin B12 deficiency      Surgical History:  Past Surgical History  Procedure Laterality Date  . Joint replacement      knee x 3  . Abdominal hysterectomy    . Colon resection      for rectovaginal fistula  . Cholecystectomy    . Colonoscopy  01/07/2012    Procedure: COLONOSCOPY;  Surgeon: Lafayette Dragon, MD;  Location: [facility];  Service: Endoscopy;  Laterality: N/A;     Prescriptions prior to admission  Medication Sig Dispense Refill Last Dose  . ACCU-CHEK AVIVA PLUS test strip 1 each by Other route daily.    06/16/2015 at Unknown time  . acetaminophen (TYLENOL) 500 MG tablet Take 1,500 mg by mouth at bedtime.    06/16/2015 at Unknown time  . aspirin EC 81 MG tablet Take 81 mg by mouth daily.  06/16/2015 at Unknown time  . cholecalciferol (VITAMIN D) 1000 UNITS tablet Take 2,000 Units by mouth at bedtime.   06/16/2015 at Unknown time  . denosumab (PROLIA) 60 MG/ML SOLN injection Inject 60 mg into the skin every 6 (six) months. Administer in upper arm, thigh, or abdomen   unk at unk  . docusate sodium (COLACE) 100 MG capsule Take 100 mg by mouth every other day.   Past Week at Unknown time  . doxazosin (CARDURA) 4 MG tablet Take 4 mg by mouth daily.   06/16/2015 at Unknown time  . furosemide (LASIX) 40 MG tablet Take 40 mg by mouth daily.   06/16/2015 at Unknown time  . gabapentin (NEURONTIN) 300 MG capsule Take 300-600 mg by mouth 2 (two) times daily. Pt takes 1 capsule in the am & 2 capsules in the pm   06/16/2015 at Unknown time  . glipiZIDE  (GLUCOTROL XL) 10 MG 24 hr tablet Take 10 mg by mouth 2 (two) times daily.   06/16/2015 at Unknown time  . HYDROcodone-acetaminophen (NORCO/VICODIN) 5-325 MG tablet Take 1 tablet by mouth every 6 (six) hours as needed for moderate pain.   Past Week at Unknown time  . hydroxychloroquine (PLAQUENIL) 200 MG tablet Take 400 mg by mouth daily.    06/16/2015 at Unknown time  . irbesartan (AVAPRO) 300 MG tablet Take 300 mg by mouth daily.   06/16/2015 at Unknown time  . montelukast (SINGULAIR) 10 MG tablet Take 10 mg by mouth at bedtime.    06/16/2015 at Unknown time  . Multiple Vitamin (MULITIVITAMIN WITH MINERALS) TABS Take 1 tablet by mouth daily.   06/16/2015 at Unknown time  . pantoprazole (PROTONIX) 40 MG tablet Take 40 mg by mouth daily.   06/16/2015 at Unknown time  . pentoxifylline (TRENTAL) 400 MG CR tablet Take 400 mg by mouth daily.   06/16/2015 at Unknown time  . pravastatin (PRAVACHOL) 40 MG tablet Take 40 mg by mouth daily.    06/16/2015 at Unknown time  . sitaGLIPtin (JANUVIA) 50 MG tablet Take 50 mg by mouth daily.   06/16/2015 at Unknown time  . ULORIC 40 MG tablet Take 40 mg by mouth daily.    06/16/2015 at Unknown time    Inpatient Medications:  . aspirin EC  81 mg Oral Daily  . febuxostat  40 mg Oral Daily  . furosemide  40 mg Oral Daily  . glipiZIDE  10 mg Oral BID WC  . hydroxychloroquine  400 mg Oral Daily  . insulin aspart  0-15 Units Subcutaneous TID WC  . insulin detemir  6 Units Subcutaneous QHS  . irbesartan  150 mg Oral Daily  . linagliptin  5 mg Oral Daily  . montelukast  10 mg Oral QHS  . pantoprazole  40 mg Oral Daily  . pioglitazone  45 mg Oral Q breakfast  . pravastatin  40 mg Oral q1800  . sodium chloride  3 mL Intravenous Q12H  . sodium chloride  3 mL Intravenous Q12H    Allergies:  Allergies  Allergen Reactions  . Norvasc [Amlodipine Besylate] Shortness Of Breath  . Glucophage [Metformin Hydrochloride] Other (See Comments)    "increases creatinine"    . Lisinopril     Headache   . Losartan     Headache   . Metoprolol Other (See Comments)    Passed out  . Morphine And Related Other (See Comments)    "hallucinations"  . Simvastatin Other (See Comments)    Body aches  .  Trulicity [Dulaglutide] Other (See Comments)    Severe mood swings    Social History   Social History  . Marital Status: Divorced    Spouse Name: N/A  . Number of Children: 2  . Years of Education: N/A   Occupational History  . Not on file.   Social History Main Topics  . Smoking status: Former Smoker    Quit date: 06/23/1983  . Smokeless tobacco: Never Used  . Alcohol Use: Yes     Comment: occasionally  . Drug Use: No  . Sexual Activity: Not Currently   Other Topics Concern  . Not on file   Social History Narrative     Family History  Problem Relation Age of Onset  . Diabetes Mother   . Diabetes Brother   . Colon cancer Neg Hx   . Hypertension Sister      Review of Systems: All other systems reviewed and are otherwise negative except as noted above.  Physical Exam: Filed Vitals:   06/17/15 1430 06/17/15 1500 06/17/15 2013 06/18/15 0500  BP: 142/54 145/56 142/53 139/48  Pulse: 70 74 83 70  Temp:  97.9 F (36.6 C) 98.3 F (36.8 C) 98.6 F (37 C)  TempSrc:  Oral Oral Oral  Resp: _0 Height:  4' 11" (1.499 m)    Weight:  248 lb 9.6 oz (112.764 kg)  228 lb 12.8 oz (103.783 kg)  SpO2: 99% 96% 92% 97%    GEN- The patient is morbidly obese, well appearing, alert and oriented x 3 today.   HEENT: normocephalic, atraumatic; sclera clear, conjunctiva pink; hearing intact; oropharynx clear; neck supple, no JVP Lymph- no cervical lymphadenopathy Lungs- Clear to ausculation bilaterally, normal work of breathing.  No wheezes, rales, rhonchi Heart- Regular rate and rhythm, soft systolic murmurs, no rubs or gallops, PMI not laterally displaced GI- soft, non-tender, non-distended Extremities- no clubbing, cyanosis, or edema MS- no  significant deformity or atrophy Skin- warm and dry, no rash or lesion Psych- euthymic mood, full affect Neuro- no gross deficits observed  Labs:   Lab Results  Component Value Date   WBC 7.4 06/17/2015   HGB 8.2* 06/17/2015   HCT 28.0* 06/17/2015   MCV 88.6 06/17/2015   PLT 295 06/17/2015    Recent Labs Lab 06/17/15 1152 06/18/15 0556  NA 141 142  K 5.0 4.6  CL 108 108  CO2 24 27  BUN 55* 51*  CREATININE 1.89* 1.67*  CALCIUM 8.7* 8.6*  PROT 6.5  --   BILITOT 0.4  --   ALKPHOS 53  --   ALT 17  --   AST 24  --   GLUCOSE 236* 159*      Radiology/Studies: No results found.  EKG:  #1: SR, RBBB, second degree AVBlock, mobtiz II #2: SR, RBBB EMS: appears possibly 2:1 AVblock HR 39  TELEMETRY: SR, appears infrequent episodes of second degree type II, there is baseline artifact  09/16/11: Echocardiogram Impressions: - Normal LV size with mild LV hypertrophy. EF 60-65%. Moderate diastolic dysfunction. Mild pulmonary hypertension. Normal RV size and systolic function.   Assessment and Plan:  1. 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      TSH in Sept 3.1 (wnl)  Pending further discussion with Dr. Lovena Le  2. HTN  3. CRI, stage III     Sept 2016 BUN/Creat 55/1.94  4. Chronic anemia     H/H in March 2013 was 9.1/28    Signed, Tommye Standard, PA-C 06/18/2015 7:08 AM  EP attending  Patient seen and examined. I agree with the documentation and findings as noted above. On exam she is a pleasant morbidly obese woman in no acute distress. Cardiovascular exam reveals a regular rate rhythm, the lungs are clear, and the abdominal exam demonstrates marked obesity. Assessment and plan 1. Stokes-Adams syncope, secondary to intermittent complete  heart block in the setting of baseline Mobitz 2 second-degree AV block 2. Morbid obesity 3. Chronic renal insufficiency 4. Hypertension Discussion: I discussed the treatment options with the patient. The risks, goals, benefits, and expectations of permanent pacemaker and insertion of been reviewed with the patient. She is willing to proceed.  Cristopher Peru, M.D.

## 2015-06-18 NOTE — Progress Notes (Signed)
Pt called nurse into room and reports that she is feeling nauseated. Dr. Einar Gip returned call with order to give patient zofran and order obtained. [facility] [employer]

## 2015-06-18 NOTE — Care Management Note (Signed)
Case Management Note Marvetta Gibbons RN, BSN Unit 2W-Case Manager [phone] Covering 3W  Patient Details  Name: Tricia Gonzales MRN: [id number] Date of Birth: 07/06/1940  Subjective/Objective:    Pt admitted with syncope - symptomatic bradycardia- s/p PPM                Action/Plan: PTA pt lived at home- CM to follow for d/c needs  Expected Discharge Date:                  Expected Discharge Plan:  Home/Self Care  In-House Referral:     Discharge planning Services  CM Consult  Post Acute Care Choice:    Choice offered to:     DME Arranged:    DME Agency:     HH Arranged:    HH Agency:     Status of Service:  In process, will continue to follow  Medicare Important Message Given:    Date Medicare IM Given:    Medicare IM give by:    Date Additional Medicare IM Given:    Additional Medicare Important Message give by:     If discussed at [place] of Stay Meetings, dates discussed:    Additional Comments:  Dawayne Patricia, RN 06/18/2015, 4:15 PM

## 2015-06-18 NOTE — Discharge Summary (Signed)
ELECTROPHYSIOLOGY PROCEDURE DISCHARGE SUMMARY    Patient ID: Tricia Gonzales,  MRN: [id number], DOB/AGE: [birth year] 73 y.o.  Admit date: 06/17/2015 Discharge date: 06/19/2015  Primary Care Physician: Reginia Naas, MD Primary Cardiologist: Radford Pax Electrophysiologist: Lovena Le  Primary Discharge Diagnosis:  Symptomatic Mobitz II and syncope status post pacemaker implantation this admission  Secondary Discharge Diagnosis:  1.  Diabetes 2.  Hypertension 3.  Obesity 4.  Chronic renal insufficiency 5.  Hyperlipidemia 6.  Osteoarthritis  Allergies  Allergen Reactions  . Norvasc [Amlodipine Besylate] Shortness Of Breath  . Glucophage [Metformin Hydrochloride] Other (See Comments)    "increases creatinine"  . Lisinopril     Headache   . Losartan     Headache   . Metoprolol Other (See Comments)    Passed out  . Morphine And Related Other (See Comments)    "hallucinations"  . Simvastatin Other (See Comments)    Body aches  . Trulicity [Dulaglutide] Other (See Comments)    Severe mood swings     Procedures This Admission:  1.  Implantation of a MDT dual chamber PPM on 06/18/15 by [name].  See op note for full details. There were no immediate post procedure complications. 2.  CXR on 06/19/15 demonstrated no pneumothorax status post device implantation.   Brief HPI/Hospital Course Tricia Gonzales is a 74 y.o. female who presented to the ER with a syncopal event that occurred while eating breakfast. She was found to be in mobitz II heart block and admitted for further evaluation. She was on no AVN blocking agents and there were no other reversible causes identified for heart block. Risks, benefits, and alternatives to PPM implantation were reviewed with the patient who wished to proceed.  The patient underwent implantation of a MDT dual chamber pacemaker with details as outlined above.  She  was monitored on telemetry overnight which demonstrated sinus rhythm with  intermittent atrial pacing.  Left chest was without hematoma or ecchymosis.  The device was interrogated and found to be functioning normally.  CXR was obtained and demonstrated no pneumothorax status post device implantation.  Wound care, arm mobility, and restrictions were reviewed with the patient.  The patient was examined and considered stable for discharge to home.    Physical Exam: Filed Vitals:   06/18/15 1845 06/18/15 2000 06/18/15 2131 06/19/15 0500  BP: 120/54 134/45 139/51 132/59  Pulse:   74 72  Temp:   98.2 F (36.8 C) 97.8 F (36.6 C)  TempSrc:   Oral Oral  Resp:    20  Height:      Weight:    249 lb 9.6 oz (113.218 kg)  SpO2:   97% 95%    GEN- The patient is obese appearing, alert and oriented x 3 today.   HEENT: normocephalic, atraumatic; sclera clear, conjunctiva pink; hearing intact; oropharynx clear; neck supple  Lungs- Clear to ausculation bilaterally, normal work of breathing.  No wheezes, rales, rhonchi Heart- Regular rate and rhythm, no murmurs, rubs or gallops  GI- soft, non-tender, non-distended, bowel sounds present  Extremities- no clubbing, cyanosis, or edema; DP/PT/radial pulses 2+ bilaterally MS- no significant deformity or atrophy Skin- warm and dry, no rash or lesion, left chest without hematoma/ecchymosis Psych- euthymic mood, full affect Neuro- strength and sensation are intact   Labs:   Lab Results  Component Value Date   WBC 7.4 06/17/2015   HGB 8.2* 06/17/2015   HCT 28.0* 06/17/2015   MCV 88.6 06/17/2015   PLT 295 06/17/2015  Recent Labs Lab 06/17/15 1152 06/18/15 0556  NA 141 142  K 5.0 4.6  CL 108 108  CO2 24 27  BUN 55* 51*  CREATININE 1.89* 1.67*  CALCIUM 8.7* 8.6*  PROT 6.5  --   BILITOT 0.4  --   ALKPHOS 53  --   ALT 17  --   AST 24  --   GLUCOSE 236* 159*    Discharge Medications:    Medication List    TAKE these medications        ACCU-CHEK AVIVA PLUS test strip  Generic drug:  glucose blood  1 each  by Other route daily.     acetaminophen 500 MG tablet  Commonly known as:  TYLENOL  Take 1,500 mg by mouth at bedtime.     aspirin EC 81 MG tablet  Take 81 mg by mouth daily.     cholecalciferol 1000 units tablet  Commonly known as:  VITAMIN D  Take 2,000 Units by mouth at bedtime.     denosumab 60 MG/ML Soln injection  Commonly known as:  PROLIA  Inject 60 mg into the skin every 6 (six) months. Administer in upper arm, thigh, or abdomen     docusate sodium 100 MG capsule  Commonly known as:  COLACE  Take 100 mg by mouth every other day.     doxazosin 4 MG tablet  Commonly known as:  CARDURA  Take 4 mg by mouth daily.     furosemide 40 MG tablet  Commonly known as:  LASIX  Take 40 mg by mouth daily.     gabapentin 300 MG capsule  Commonly known as:  NEURONTIN  Take 300-600 mg by mouth 2 (two) times daily. Pt takes 1 capsule in the am & 2 capsules in the pm     glipiZIDE 10 MG 24 hr tablet  Commonly known as:  GLUCOTROL XL  Take 10 mg by mouth 2 (two) times daily.     HYDROcodone-acetaminophen 5-325 MG tablet  Commonly known as:  NORCO/VICODIN  Take 1 tablet by mouth every 6 (six) hours as needed for moderate pain.     hydroxychloroquine 200 MG tablet  Commonly known as:  PLAQUENIL  Take 400 mg by mouth daily.     irbesartan 300 MG tablet  Commonly known as:  AVAPRO  Take 300 mg by mouth daily.     montelukast 10 MG tablet  Commonly known as:  SINGULAIR  Take 10 mg by mouth at bedtime.     multivitamin with minerals Tabs tablet  Take 1 tablet by mouth daily.     pantoprazole 40 MG tablet  Commonly known as:  PROTONIX  Take 40 mg by mouth daily.     pentoxifylline 400 MG CR tablet  Commonly known as:  TRENTAL  Take 400 mg by mouth daily.     pravastatin 40 MG tablet  Commonly known as:  PRAVACHOL  Take 40 mg by mouth daily.     sitaGLIPtin 50 MG tablet  Commonly known as:  JANUVIA  Take 50 mg by mouth daily.     ULORIC 40 MG tablet  Generic drug:   febuxostat  Take 40 mg by mouth daily.        Disposition:  Discharge Instructions    Diet - low sodium heart healthy    Complete by:  As directed      Increase activity slowly    Complete by:  As directed  Follow-up Information    Follow up with [facility] On 06/27/2015.   Specialty:  Cardiology   Why:  at 10:30AM for wound check    Contact information:   [address], [place] [id number]      Follow up with Cristopher Peru, MD On 09/18/2015.   Specialty:  Cardiology   Why:  at 9:15AM   Contact information:   [place] [address] [phone]       Duration of Discharge Encounter: Greater than 30 minutes including physician time.  Signed, [name], NP 06/19/2015 8:29 AM  Device interrogation is reviewed and normal.  CXR reviewed. Thompson Grayer MD, [facility] 06/19/2015 2:43 PM

## 2015-06-18 NOTE — Progress Notes (Signed)
Utilization review completed

## 2015-06-18 NOTE — Interval H&P Note (Signed)
History and Physical Interval Note:  06/18/2015 2:05 PM  Tricia Gonzales  has presented today for surgery, with the diagnosis of syncope  The various methods of treatment have been discussed with the patient and family. After consideration of risks, benefits and other options for treatment, the patient has consented to  Procedure(s): Pacemaker Implant (N/A) as a surgical intervention .  The patient's history has been reviewed, patient examined, no change in status, stable for surgery.  I have reviewed the patient's chart and labs.  Questions were answered to the patient's satisfaction.     Cristopher Peru

## 2015-06-18 NOTE — Consult Note (Signed)
ELECTROPHYSIOLOGY CONSULT NOTE    Patient ID: [name] MRN: [id number], DOB/AGE: Feb 16, 1941 74 y.o.  Admit date: 06/17/2015 Date of Consult: 06/18/2015   Primary Physician: Reginia Naas, MD Primary Cardiologist: Dr. Einar Gip  Reason for Consultation: syncope, bradycardia  HPI: Tricia Gonzales is a 74 y.o. female who suffered a syncopal event at home.  She was seated at the time, eating breakfast and talking on the phone with her sister when she got a "feeling" in her head and woke on the floor.  She estimates the time from fainting to waking as a few seconds only.  She did not ask her sister, but the call had dropped and when she called her back and told her she had fainted her sister called 18.  The patient states she felt weak, but otherwise fine.  She had no CP, palpitations or SOB pre or post syncope. When EMS arrived her HR was in the 30's, upon arrival to ER the 70's.  Unknown vitals otherwise by EMS.  The patient reports 3 years ago with the prior syncopal event noted in her record that the event happened the same way, at that time also found bradycardiac and her metoprolol stopped.  Since then she has been well from a cardiac stand point.   In the last 6 months or so though a few very fleeting dizzy spells.  And about 2 months ago she woke in the middle of the night feeling very weak like she could hardly move and she sat at the edge of the bed, at that time feels like she blacked out a second, she did not seek attention for this.  She denies trauma with yesterday's event or the prior.  Her PMHx is that of morbid obesity, HTN, DM, osteoporosis and OA, currently being evaluated by ortho for possible stress fracture LLE, Sjogren's syndrome, CRI and chronic anemia she reports seeing a hematologist for some years ago without clear findings for her anemia.    She is feeling well this morning, without any CP, palpitations or SOB.  She does get winded with increased exertion, this  observed over the last few years and has attributed it to her weight an lack of conditioning over the years, noting no acute changes in her exertional capacity.  Past Medical History  Diagnosis Date  . Diabetes mellitus   . Hypertension   . Obesity   . Sjogren's disease ([place])   . Hyperlipidemia   . Anemia   . Diverticulosis   . Osteoarthritis   . Intestinal obstruction ([place])   . Hiatal hernia   . Esophageal stricture   . Vitamin B12 deficiency      Surgical History:  Past Surgical History  Procedure Laterality Date  . Joint replacement      knee x 3  . Abdominal hysterectomy    . Colon resection      for rectovaginal fistula  . Cholecystectomy    . Colonoscopy  01/07/2012    Procedure: COLONOSCOPY;  Surgeon: Lafayette Dragon, MD;  Location: [facility] ENDOSCOPY;  Service: Endoscopy;  Laterality: N/A;     Prescriptions prior to admission  Medication Sig Dispense Refill Last Dose  . ACCU-CHEK AVIVA PLUS test strip 1 each by Other route daily.    06/16/2015 at Unknown time  . acetaminophen (TYLENOL) 500 MG tablet Take 1,500 mg by mouth at bedtime.    06/16/2015 at Unknown time  . aspirin EC 81 MG tablet Take 81 mg by mouth daily.  06/16/2015 at Unknown time  . cholecalciferol (VITAMIN D) 1000 UNITS tablet Take 2,000 Units by mouth at bedtime.   06/16/2015 at Unknown time  . denosumab (PROLIA) 60 MG/ML SOLN injection Inject 60 mg into the skin every 6 (six) months. Administer in upper arm, thigh, or abdomen   unk at unk  . docusate sodium (COLACE) 100 MG capsule Take 100 mg by mouth every other day.   Past Week at Unknown time  . doxazosin (CARDURA) 4 MG tablet Take 4 mg by mouth daily.   06/16/2015 at Unknown time  . furosemide (LASIX) 40 MG tablet Take 40 mg by mouth daily.   06/16/2015 at Unknown time  . gabapentin (NEURONTIN) 300 MG capsule Take 300-600 mg by mouth 2 (two) times daily. Pt takes 1 capsule in the am & 2 capsules in the pm   06/16/2015 at Unknown time  . glipiZIDE  (GLUCOTROL XL) 10 MG 24 hr tablet Take 10 mg by mouth 2 (two) times daily.   06/16/2015 at Unknown time  . HYDROcodone-acetaminophen (NORCO/VICODIN) 5-325 MG tablet Take 1 tablet by mouth every 6 (six) hours as needed for moderate pain.   Past Week at Unknown time  . hydroxychloroquine (PLAQUENIL) 200 MG tablet Take 400 mg by mouth daily.    06/16/2015 at Unknown time  . irbesartan (AVAPRO) 300 MG tablet Take 300 mg by mouth daily.   06/16/2015 at Unknown time  . montelukast (SINGULAIR) 10 MG tablet Take 10 mg by mouth at bedtime.    06/16/2015 at Unknown time  . Multiple Vitamin (MULITIVITAMIN WITH MINERALS) TABS Take 1 tablet by mouth daily.   06/16/2015 at Unknown time  . pantoprazole (PROTONIX) 40 MG tablet Take 40 mg by mouth daily.   06/16/2015 at Unknown time  . pentoxifylline (TRENTAL) 400 MG CR tablet Take 400 mg by mouth daily.   06/16/2015 at Unknown time  . pravastatin (PRAVACHOL) 40 MG tablet Take 40 mg by mouth daily.    06/16/2015 at Unknown time  . sitaGLIPtin (JANUVIA) 50 MG tablet Take 50 mg by mouth daily.   06/16/2015 at Unknown time  . ULORIC 40 MG tablet Take 40 mg by mouth daily.    06/16/2015 at Unknown time    Inpatient Medications:  . aspirin EC  81 mg Oral Daily  . febuxostat  40 mg Oral Daily  . furosemide  40 mg Oral Daily  . glipiZIDE  10 mg Oral BID WC  . hydroxychloroquine  400 mg Oral Daily  . insulin aspart  0-15 Units Subcutaneous TID WC  . insulin detemir  6 Units Subcutaneous QHS  . irbesartan  150 mg Oral Daily  . linagliptin  5 mg Oral Daily  . montelukast  10 mg Oral QHS  . pantoprazole  40 mg Oral Daily  . pioglitazone  45 mg Oral Q breakfast  . pravastatin  40 mg Oral q1800  . sodium chloride  3 mL Intravenous Q12H  . sodium chloride  3 mL Intravenous Q12H    Allergies:  Allergies  Allergen Reactions  . Norvasc [Amlodipine Besylate] Shortness Of Breath  . Glucophage [Metformin Hydrochloride] Other (See Comments)    "increases creatinine"    . Lisinopril     Headache   . Losartan     Headache   . Metoprolol Other (See Comments)    Passed out  . Morphine And Related Other (See Comments)    "hallucinations"  . Simvastatin Other (See Comments)    Body aches  .  Trulicity [Dulaglutide] Other (See Comments)    Severe mood swings    Social History   Social History  . Marital Status: Divorced    Spouse Name: N/A  . Number of Children: 2  . Years of Education: N/A   Occupational History  . Not on file.   Social History Main Topics  . Smoking status: Former Smoker    Quit date: 06/23/1983  . Smokeless tobacco: Never Used  . Alcohol Use: Yes     Comment: occasionally  . Drug Use: No  . Sexual Activity: Not Currently   Other Topics Concern  . Not on file   Social History Narrative     Family History  Problem Relation Age of Onset  . Diabetes Mother   . Diabetes Brother   . Colon cancer Neg Hx   . Hypertension Sister      Review of Systems: All other systems reviewed and are otherwise negative except as noted above.  Physical Exam: Filed Vitals:   06/17/15 1430 06/17/15 1500 06/17/15 2013 06/18/15 0500  BP: 142/54 145/56 142/53 139/48  Pulse: 70 74 83 70  Temp:  97.9 F (36.6 C) 98.3 F (36.8 C) 98.6 F (37 C)  TempSrc:  Oral Oral Oral  Resp: _0 Height:  4' 11" (1.499 m)    Weight:  248 lb 9.6 oz (112.764 kg)  228 lb 12.8 oz (103.783 kg)  SpO2: 99% 96% 92% 97%    GEN- The patient is morbidly obese, well appearing, alert and oriented x 3 today.   HEENT: normocephalic, atraumatic; sclera clear, conjunctiva pink; hearing intact; oropharynx clear; neck supple, no JVP Lymph- no cervical lymphadenopathy Lungs- Clear to ausculation bilaterally, normal work of breathing.  No wheezes, rales, rhonchi Heart- Regular rate and rhythm, soft systolic murmurs, no rubs or gallops, PMI not laterally displaced GI- soft, non-tender, non-distended Extremities- no clubbing, cyanosis, or edema MS- no  significant deformity or atrophy Skin- warm and dry, no rash or lesion Psych- euthymic mood, full affect Neuro- no gross deficits observed  Labs:   Lab Results  Component Value Date   WBC 7.4 06/17/2015   HGB 8.2* 06/17/2015   HCT 28.0* 06/17/2015   MCV 88.6 06/17/2015   PLT 295 06/17/2015    Recent Labs Lab 06/17/15 1152 06/18/15 0556  NA 141 142  K 5.0 4.6  CL 108 108  CO2 24 27  BUN 55* 51*  CREATININE 1.89* 1.67*  CALCIUM 8.7* 8.6*  PROT 6.5  --   BILITOT 0.4  --   ALKPHOS 53  --   ALT 17  --   AST 24  --   GLUCOSE 236* 159*      Radiology/Studies: No results found.  EKG:  #1: SR, RBBB, second degree AVBlock, mobtiz II #2: SR, RBBB EMS: appears possibly 2:1 AVblock HR 39  TELEMETRY: SR, appears infrequent episodes of second degree type II, there is baseline artifact  09/16/11: Echocardiogram Impressions: - Normal LV size with mild LV hypertrophy. EF 60-65%. Moderate diastolic dysfunction. Mild pulmonary hypertension. Normal RV size and systolic function.   Assessment and Plan:  1. 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      TSH in Sept 3.1 (wnl)  Pending further discussion with Dr. Lovena Le  2. HTN  3. CRI, stage III     Sept 2016 BUN/Creat 55/1.94  4. Chronic anemia     H/H in March 2013 was 9.1/28    Signed, Tommye Standard, PA-C 06/18/2015 7:08 AM  EP attending  Patient seen and examined. I agree with the documentation and findings as noted above. On exam she is a pleasant morbidly obese woman in no acute distress. Cardiovascular exam reveals a regular rate rhythm, the lungs are clear, and the abdominal exam demonstrates marked obesity. Assessment and plan 1. Stokes-Adams syncope, secondary to intermittent complete  heart block in the setting of baseline Mobitz 2 second-degree AV block 2. Morbid obesity 3. Chronic renal insufficiency 4. Hypertension Discussion: I discussed the treatment options with the patient. The risks, goals, benefits, and expectations of permanent pacemaker and insertion of been reviewed with the patient. She is willing to proceed.  Cristopher Peru, M.D.

## 2015-06-18 NOTE — Clinical Documentation Improvement (Signed)
Cardiology  Can the diagnosis of CRI, stage III be further specified?   CKD Stage I - GFR greater than or equal to 90  CKD Stage II - GFR 60-89  CKD Stage III - GFR 30-59  CKD Stage IV - GFR 15-29  CKD Stage V - GFR < 15  ESRD (End Stage Renal Disease)  Other condition  Unable to clinically determine   Supporting Information: : (risk factors, signs and symptoms, diagnostics, treatment) Component     Latest Ref Rng 06/17/2015 06/18/2015           BUN     6 - 20 mg/dL 55 (H) 51 (H)  Creatinine     0.44 - 1.00 mg/dL 1.89 (H) 1.67 (H)  EGFR (Non-African Amer.)     >60 mL/min 25 (L) 29 (L)   Please exercise your independent, professional judgment when responding. A specific answer is not anticipated or expected. Please update your documentation within the medical record to reflect your response to this query. Thank you  Thank You, [place] [phone]

## 2015-06-18 NOTE — Discharge Instructions (Signed)
° ° °  Supplemental Discharge Instructions for  Pacemaker/Defibrillator Patients  Activity No heavy lifting or vigorous activity with your left/right arm for 6 to 8 weeks.  Do not raise your left/right arm above your head for one week.  Gradually raise your affected arm as drawn below.           __      06/22/15                  06/23/15                    06/24/15                     06/25/15  NO DRIVING for   1 week  ; you may begin driving on  06/30/89   .  WOUND CARE - Keep the wound area clean and dry.  Do not get this area wet for one week. No showers for one week; you may shower on   06/25/15  . - The tape/steri-strips on your wound will fall off; do not pull them off.  No bandage is needed on the site.  DO  NOT apply any creams, oils, or ointments to the wound area. - If you notice any drainage or discharge from the wound, any swelling or bruising at the site, or you develop a fever > 101? F after you are discharged home, call the office at once.  Special Instructions - You are still able to use cellular telephones; use the ear opposite the side where you have your pacemaker/defibrillator.  Avoid carrying your cellular phone near your device. - When traveling through airports, show security personnel your identification card to avoid being screened in the metal detectors.  Ask the security personnel to use the hand wand. - Avoid arc welding equipment, MRI testing (magnetic resonance imaging), TENS units (transcutaneous nerve stimulators).  Call the office for questions about other devices. - Avoid electrical appliances that are in poor condition or are not properly grounded. - Microwave ovens are safe to be near or to operate.

## 2015-06-19 ENCOUNTER — Encounter (HOSPITAL_COMMUNITY): Payer: Self-pay | Admitting: Internal Medicine

## 2015-06-19 ENCOUNTER — Inpatient Hospital Stay (HOSPITAL_COMMUNITY): Payer: Medicare Other

## 2015-06-19 DIAGNOSIS — R55 Syncope and collapse: Secondary | ICD-10-CM

## 2015-06-19 DIAGNOSIS — Z950 Presence of cardiac pacemaker: Secondary | ICD-10-CM

## 2015-06-19 DIAGNOSIS — I4949 Other premature depolarization: Secondary | ICD-10-CM

## 2015-06-19 LAB — GLUCOSE, CAPILLARY
Glucose-Capillary: 152 mg/dL — ABNORMAL HIGH (ref 65–99)
Glucose-Capillary: 162 mg/dL — ABNORMAL HIGH (ref 65–99)

## 2015-06-19 NOTE — Evaluation (Signed)
Physical Therapy Evaluation Patient Details Name: RENN STILLE MRN: [id number] DOB: 12/09/1940 Today's Date: 06/19/2015   History of Present Illness  Pt presents after syncopal episode sitting at her table. Received pacemaker 06/18/15. Her PMHx is that of morbid obesity, HTN, DM, osteoporosis and OA, currently being evaluated by ortho for possible stress fracture LLE, Sjogren's syndrome, CRI and chronic anemia  Clinical Impression  Patient evaluated by Physical Therapy with no further acute PT needs identified. All education has been completed and the patient has no further questions. Pt agreeable to HHPT for work on strengthening and balance. See below for any follow-up Physical Therapy or equipment needs. PT is signing off. Thank you for this referral.     Follow Up Recommendations Home health PT;Supervision - Intermittent    Equipment Recommendations  None recommended by PT    Recommendations for Other Services       Precautions / Restrictions Precautions Precautions: Fall;ICD/Pacemaker Precaution Comments: has a LLE stress fracture, pt unsure tibia or fibula, was told to use RW and was to have bone scan tomorrow Restrictions Weight Bearing Restrictions: No      Mobility  Bed Mobility Overal bed mobility: Independent                Transfers Overall transfer level: Modified independent Equipment used: Rolling walker (2 wheeled)             General transfer comment: able to stand without use of LUE  Ambulation/Gait Ambulation/Gait assistance: Supervision Ambulation Distance (Feet): 60 Feet Assistive device: Rolling walker (2 wheeled) Gait Pattern/deviations: Step-through pattern Gait velocity: decreased Gait velocity interpretation: <1.8 ft/sec, indicative of risk for recurrent falls General Gait Details: discussed not putting significant wt through LUE on RW, pt able to do this. One misstep with self correction, feel that the balance benefits of the RW  outweigh the risk of mild WB'ing through LUE.   Stairs Stairs:  (discussed stair technique)          Wheelchair Mobility    Modified Rankin (Stroke Patients Only)       Balance Overall balance assessment: Needs assistance Sitting-balance support: No upper extremity supported Sitting balance-Leahy Scale: Good     Standing balance support: No upper extremity supported Standing balance-Leahy Scale: Fair Standing balance comment: can stand without UE support for activity within BOS, needs UE support when stepping                             Pertinent Vitals/Pain Pain Assessment: Faces Faces Pain Scale: Hurts little more Pain Location: left leg Pain Descriptors / Indicators: Aching Pain Intervention(s): Limited activity within patient's tolerance;Monitored during session    [place] expects to be discharged to:: Private residence Living Arrangements: Alone   Type of Home: House Home Access: Stairs to enter Entrance Stairs-Rails: Psychiatric nurse of Steps: 3 Home Layout: One level Home Equipment: Cane - single point;Walker - 2 wheels;Adaptive equipment Additional Comments: pt's daughter was going to stay with her but she has the flu. Her son can help out some but will probably not stay the night. Has 2 sisters that can help some as well    Prior Function Level of Independence: Independent with assistive device(s)         Comments: has been using RW since LLE stress fx (unknown cause, no fall)     Hand Dominance        Extremity/Trunk Assessment  Lower Extremity Assessment: Generalized weakness      Cervical / Trunk Assessment: Kyphotic  Communication   Communication: No difficulties  Cognition Arousal/Alertness: Awake/alert Behavior During Therapy: WFL for tasks assessed/performed Overall Cognitive Status: Within Functional Limits for tasks assessed                       General Comments General comments (skin integrity, edema, etc.): reviewed precautions for pacer    Exercises        Assessment/Plan    PT Assessment All further PT needs can be met in the next venue of care  PT Diagnosis Acute pain;Generalized weakness;Difficulty walking   PT Problem List Decreased strength;Decreased balance;Decreased mobility;Decreased knowledge of precautions;Pain  PT Treatment Interventions     PT Goals (Current goals can be found in the Care Plan section) Acute Rehab PT Goals Patient Stated Goal: return home PT Goal Formulation: All assessment and education complete, DC therapy    Frequency     Barriers to discharge        Co-evaluation               End of Session Equipment Utilized During Treatment: Gait belt Activity Tolerance: Patient tolerated treatment well Patient left: in bed;with call bell/phone within reach Nurse Communication: Mobility status         Time: 1209-1227 PT Time Calculation (min) (ACUTE ONLY): 18 min   Charges:   PT Evaluation $Initial PT Evaluation Tier I: 1 Procedure     PT G Codes:      Leighton Roach, PT  Acute Rehab Services  [place], [place] 06/19/2015, 2:17 PM

## 2015-06-19 NOTE — Care Management Note (Signed)
Case Management Note  Patient Details  Name: Tricia Gonzales MRN: [id number] Date of Birth: 10-29-1940  Subjective/Objective: Pt admitted for syncope- post pacemaker.                  Action/Plan: Pt agreeable to [facility] PT Services. CM did make referral with [facility]. SOC to begin within 24-48 hours of d/c. No further needs from CM at this time.    Expected Discharge Date:                  Expected Discharge Plan:  [place]  In-House Referral:  NA  Discharge planning Services  CM Consult  Post Acute Care Choice:  Home Health Choice offered to:  Patient  DME Arranged:  N/A DME Agency:  NA  HH Arranged:  PT [place] Agency:  [place]  Status of Service:  Completed, signed off  Medicare Important Message Given:    Date Medicare IM Given:    Medicare IM give by:    Date Additional Medicare IM Given:    Additional Medicare Important Message give by:     If discussed at [place] of Stay Meetings, dates discussed:    Additional Comments:  Bethena Roys, RN 06/19/2015, 3:04 PM

## 2015-06-20 ENCOUNTER — Ambulatory Visit (HOSPITAL_COMMUNITY)
Admission: RE | Admit: 2015-06-20 | Discharge: 2015-06-20 | Disposition: A | Discharge status: Home / Self Care | Payer: Medicare Other | Source: Ambulatory Visit | Attending: Orthopedic Surgery | Admitting: Orthopedic Surgery

## 2015-06-20 ENCOUNTER — Encounter (HOSPITAL_COMMUNITY)
Admission: RE | Admit: 2015-06-20 | Discharge: 2015-06-20 | Disposition: A | Discharge status: Home / Self Care | Payer: Medicare Other | Source: Ambulatory Visit | Attending: Orthopedic Surgery | Admitting: Orthopedic Surgery

## 2015-06-20 DIAGNOSIS — Z96652 Presence of left artificial knee joint: Secondary | ICD-10-CM | POA: Diagnosis not present

## 2015-06-20 DIAGNOSIS — T8484XA Pain due to internal orthopedic prosthetic devices, implants and grafts, initial encounter: Secondary | ICD-10-CM

## 2015-06-20 DIAGNOSIS — R938 Abnormal findings on diagnostic imaging of other specified body structures: Secondary | ICD-10-CM | POA: Diagnosis not present

## 2015-06-20 DIAGNOSIS — X58XXXA Exposure to other specified factors, initial encounter: Secondary | ICD-10-CM | POA: Diagnosis not present

## 2015-06-20 MED ORDER — TECHNETIUM TC 99M MEDRONATE IV KIT
25.0000 | PACK | Freq: Once | INTRAVENOUS | Status: AC | PRN
  Administered 2015-06-20: 27.1 via INTRAVENOUS

## 2015-06-27 ENCOUNTER — Encounter: Payer: Self-pay | Admitting: Internal Medicine

## 2015-06-27 ENCOUNTER — Ambulatory Visit (INDEPENDENT_AMBULATORY_CARE_PROVIDER_SITE_OTHER): Payer: Medicare Other | Admitting: *Deleted

## 2015-06-27 DIAGNOSIS — I442 Atrioventricular block, complete: Secondary | ICD-10-CM | POA: Diagnosis not present

## 2015-06-27 LAB — CUP PACEART INCLINIC DEVICE CHECK
Battery Voltage: 3.11 V
Brady Statistic AP VP Percent: 13.45 %
Brady Statistic AP VS Percent: 3.55 %
Brady Statistic AS VP Percent: 6.02 %
Brady Statistic AS VS Percent: 76.99 %
Brady Statistic RA Percent Paced: 17 %
Brady Statistic RV Percent Paced: 19.47 %
Date Time Interrogation Session: 20170105113153
Implantable Lead Implant Date: 20161227
Implantable Lead Implant Date: 20161227
Implantable Lead Location: 753859
Implantable Lead Location: 753860
Implantable Lead Model: 5076
Implantable Lead Model: 5076
Lead Channel Impedance Value: 323 Ohm
Lead Channel Impedance Value: 342 Ohm
Lead Channel Impedance Value: 399 Ohm
Lead Channel Impedance Value: 456 Ohm
Lead Channel Pacing Threshold Amplitude: 0.75 V
Lead Channel Pacing Threshold Amplitude: 0.75 V
Lead Channel Pacing Threshold Pulse Width: 0.4 ms
Lead Channel Pacing Threshold Pulse Width: 0.4 ms
Lead Channel Sensing Intrinsic Amplitude: 12.25 mV
Lead Channel Sensing Intrinsic Amplitude: 2.875 mV
Lead Channel Setting Pacing Amplitude: 3.5 V
Lead Channel Setting Pacing Amplitude: 3.5 V
Lead Channel Setting Pacing Pulse Width: 0.4 ms
Lead Channel Setting Sensing Sensitivity: 2.8 mV

## 2015-06-27 NOTE — Progress Notes (Signed)
Wound check appointment. Steri-strips removed. Wound without redness or edema. Incision edges approximated, wound well healed. Slight yellow bruising dissipating ~2" below incision. Normal device function. Thresholds, sensing, and impedances consistent with implant measurements. Device programmed at 3.5V/auto capture programmed on for extra safety margin until 3 month visit. Histogram distribution appropriate for patient and level of activity. No mode switches or high ventricular rates noted. Patient educated about wound care, arm mobility, lifting restrictions. Patient notes her L shoulder is stiff since implant, encouraged ROM exercises and to call with worsening pain/stiffness. ROV with GT on 09/18/15 at 11:30am.

## 2015-08-06 ENCOUNTER — Observation Stay (HOSPITAL_BASED_OUTPATIENT_CLINIC_OR_DEPARTMENT_OTHER): Payer: Medicare Other

## 2015-08-06 ENCOUNTER — Inpatient Hospital Stay (HOSPITAL_COMMUNITY)
Admission: EM | Admit: 2015-08-06 | Discharge: 2015-08-13 | DRG: 314 | Disposition: A | Discharge status: Home Health | Payer: Medicare Other | Attending: Internal Medicine | Admitting: Internal Medicine

## 2015-08-06 ENCOUNTER — Emergency Department (HOSPITAL_COMMUNITY): Payer: Medicare Other

## 2015-08-06 ENCOUNTER — Observation Stay (HOSPITAL_COMMUNITY): Payer: Medicare Other

## 2015-08-06 ENCOUNTER — Encounter (HOSPITAL_COMMUNITY): Payer: Self-pay

## 2015-08-06 DIAGNOSIS — N179 Acute kidney failure, unspecified: Secondary | ICD-10-CM | POA: Diagnosis present

## 2015-08-06 DIAGNOSIS — Z87891 Personal history of nicotine dependence: Secondary | ICD-10-CM

## 2015-08-06 DIAGNOSIS — R079 Chest pain, unspecified: Secondary | ICD-10-CM | POA: Diagnosis not present

## 2015-08-06 DIAGNOSIS — B9620 Unspecified Escherichia coli [E. coli] as the cause of diseases classified elsewhere: Secondary | ICD-10-CM | POA: Diagnosis present

## 2015-08-06 DIAGNOSIS — D649 Anemia, unspecified: Secondary | ICD-10-CM | POA: Diagnosis present

## 2015-08-06 DIAGNOSIS — Z9049 Acquired absence of other specified parts of digestive tract: Secondary | ICD-10-CM

## 2015-08-06 DIAGNOSIS — K219 Gastro-esophageal reflux disease without esophagitis: Secondary | ICD-10-CM | POA: Diagnosis present

## 2015-08-06 DIAGNOSIS — E119 Type 2 diabetes mellitus without complications: Secondary | ICD-10-CM

## 2015-08-06 DIAGNOSIS — Z6841 Body Mass Index (BMI) 40.0 and over, adult: Secondary | ICD-10-CM

## 2015-08-06 DIAGNOSIS — Z833 Family history of diabetes mellitus: Secondary | ICD-10-CM

## 2015-08-06 DIAGNOSIS — I959 Hypotension, unspecified: Secondary | ICD-10-CM | POA: Diagnosis not present

## 2015-08-06 DIAGNOSIS — E1122 Type 2 diabetes mellitus with diabetic chronic kidney disease: Secondary | ICD-10-CM | POA: Diagnosis present

## 2015-08-06 DIAGNOSIS — R071 Chest pain on breathing: Secondary | ICD-10-CM | POA: Diagnosis not present

## 2015-08-06 DIAGNOSIS — K222 Esophageal obstruction: Secondary | ICD-10-CM | POA: Diagnosis present

## 2015-08-06 DIAGNOSIS — M7989 Other specified soft tissue disorders: Secondary | ICD-10-CM

## 2015-08-06 DIAGNOSIS — R195 Other fecal abnormalities: Secondary | ICD-10-CM | POA: Diagnosis present

## 2015-08-06 DIAGNOSIS — Z7984 Long term (current) use of oral hypoglycemic drugs: Secondary | ICD-10-CM

## 2015-08-06 DIAGNOSIS — I1310 Hypertensive heart and chronic kidney disease without heart failure, with stage 1 through stage 4 chronic kidney disease, or unspecified chronic kidney disease: Secondary | ICD-10-CM | POA: Diagnosis present

## 2015-08-06 DIAGNOSIS — R001 Bradycardia, unspecified: Secondary | ICD-10-CM | POA: Diagnosis present

## 2015-08-06 DIAGNOSIS — Z885 Allergy status to narcotic agent status: Secondary | ICD-10-CM

## 2015-08-06 DIAGNOSIS — Z8249 Family history of ischemic heart disease and other diseases of the circulatory system: Secondary | ICD-10-CM

## 2015-08-06 DIAGNOSIS — I10 Essential (primary) hypertension: Secondary | ICD-10-CM | POA: Diagnosis not present

## 2015-08-06 DIAGNOSIS — E1165 Type 2 diabetes mellitus with hyperglycemia: Secondary | ICD-10-CM | POA: Diagnosis present

## 2015-08-06 DIAGNOSIS — E875 Hyperkalemia: Secondary | ICD-10-CM | POA: Diagnosis not present

## 2015-08-06 DIAGNOSIS — I4510 Unspecified right bundle-branch block: Secondary | ICD-10-CM | POA: Diagnosis present

## 2015-08-06 DIAGNOSIS — E7800 Pure hypercholesterolemia, unspecified: Secondary | ICD-10-CM | POA: Diagnosis present

## 2015-08-06 DIAGNOSIS — Z950 Presence of cardiac pacemaker: Secondary | ICD-10-CM | POA: Diagnosis not present

## 2015-08-06 DIAGNOSIS — E785 Hyperlipidemia, unspecified: Secondary | ICD-10-CM | POA: Diagnosis present

## 2015-08-06 DIAGNOSIS — N184 Chronic kidney disease, stage 4 (severe): Secondary | ICD-10-CM | POA: Diagnosis present

## 2015-08-06 DIAGNOSIS — N183 Chronic kidney disease, stage 3 unspecified: Secondary | ICD-10-CM | POA: Diagnosis present

## 2015-08-06 DIAGNOSIS — Z79899 Other long term (current) drug therapy: Secondary | ICD-10-CM

## 2015-08-06 DIAGNOSIS — R0601 Orthopnea: Secondary | ICD-10-CM | POA: Diagnosis present

## 2015-08-06 DIAGNOSIS — E8770 Fluid overload, unspecified: Secondary | ICD-10-CM | POA: Diagnosis not present

## 2015-08-06 DIAGNOSIS — N390 Urinary tract infection, site not specified: Secondary | ICD-10-CM | POA: Diagnosis present

## 2015-08-06 DIAGNOSIS — K2960 Other gastritis without bleeding: Secondary | ICD-10-CM | POA: Diagnosis present

## 2015-08-06 DIAGNOSIS — K5790 Diverticulosis of intestine, part unspecified, without perforation or abscess without bleeding: Secondary | ICD-10-CM | POA: Diagnosis present

## 2015-08-06 DIAGNOSIS — M3500 Sicca syndrome, unspecified: Secondary | ICD-10-CM | POA: Diagnosis present

## 2015-08-06 DIAGNOSIS — J189 Pneumonia, unspecified organism: Secondary | ICD-10-CM | POA: Diagnosis present

## 2015-08-06 DIAGNOSIS — K449 Diaphragmatic hernia without obstruction or gangrene: Secondary | ICD-10-CM | POA: Diagnosis present

## 2015-08-06 DIAGNOSIS — Z96659 Presence of unspecified artificial knee joint: Secondary | ICD-10-CM | POA: Diagnosis present

## 2015-08-06 DIAGNOSIS — Z888 Allergy status to other drugs, medicaments and biological substances status: Secondary | ICD-10-CM

## 2015-08-06 DIAGNOSIS — I309 Acute pericarditis, unspecified: Principal | ICD-10-CM | POA: Diagnosis present

## 2015-08-06 DIAGNOSIS — Z7982 Long term (current) use of aspirin: Secondary | ICD-10-CM

## 2015-08-06 DIAGNOSIS — D631 Anemia in chronic kidney disease: Secondary | ICD-10-CM | POA: Diagnosis present

## 2015-08-06 HISTORY — DX: Other chronic pain: G89.29

## 2015-08-06 HISTORY — DX: Depression, unspecified: F32.A

## 2015-08-06 HISTORY — DX: Presence of cardiac pacemaker: Z95.0

## 2015-08-06 HISTORY — DX: Type 2 diabetes mellitus without complications: E11.9

## 2015-08-06 HISTORY — DX: Unspecified osteoarthritis, unspecified site: M19.90

## 2015-08-06 HISTORY — DX: Personal history of other diseases of the musculoskeletal system and connective tissue: Z87.39

## 2015-08-06 HISTORY — DX: Chronic kidney disease, stage 3 (moderate): N18.3

## 2015-08-06 HISTORY — DX: Dorsalgia, unspecified: M54.9

## 2015-08-06 HISTORY — DX: Personal history of other medical treatment: Z92.89

## 2015-08-06 HISTORY — DX: Anxiety disorder, unspecified: F41.9

## 2015-08-06 HISTORY — DX: Major depressive disorder, single episode, unspecified: F32.9

## 2015-08-06 HISTORY — DX: Chronic kidney disease, stage 3 unspecified: N18.30

## 2015-08-06 HISTORY — DX: Calculus of kidney: N20.0

## 2015-08-06 LAB — URINE MICROSCOPIC-ADD ON

## 2015-08-06 LAB — CBC WITH DIFFERENTIAL/PLATELET
Basophils Absolute: 0 10*3/uL (ref 0.0–0.1)
Basophils Absolute: 0 10*3/uL (ref 0.0–0.1)
Basophils Absolute: 0 10*3/uL (ref 0.0–0.1)
Basophils Relative: 0 %
Basophils Relative: 0 %
Basophils Relative: 0 %
Eosinophils Absolute: 0.1 10*3/uL (ref 0.0–0.7)
Eosinophils Absolute: 0 10*3/uL (ref 0.0–0.7)
Eosinophils Absolute: 0 10*3/uL (ref 0.0–0.7)
Eosinophils Relative: 1 %
Eosinophils Relative: 0 %
Eosinophils Relative: 0 %
HCT: 26.1 % — ABNORMAL LOW (ref 36.0–46.0)
HCT: 25 % — ABNORMAL LOW (ref 36.0–46.0)
HCT: 23.9 % — ABNORMAL LOW (ref 36.0–46.0)
Hemoglobin: 7.8 g/dL — ABNORMAL LOW (ref 12.0–15.0)
Hemoglobin: 7.3 g/dL — ABNORMAL LOW (ref 12.0–15.0)
Hemoglobin: 7.1 g/dL — ABNORMAL LOW (ref 12.0–15.0)
Lymphocytes Relative: 7 %
Lymphocytes Relative: 2 %
Lymphocytes Relative: 3 %
Lymphs Abs: 0.8 10*3/uL (ref 0.7–4.0)
Lymphs Abs: 0.3 10*3/uL — ABNORMAL LOW (ref 0.7–4.0)
Lymphs Abs: 0.6 10*3/uL — ABNORMAL LOW (ref 0.7–4.0)
MCH: 25.8 pg — ABNORMAL LOW (ref 26.0–34.0)
MCH: 25.4 pg — ABNORMAL LOW (ref 26.0–34.0)
MCH: 26 pg (ref 26.0–34.0)
MCHC: 29.9 g/dL — ABNORMAL LOW (ref 30.0–36.0)
MCHC: 29.2 g/dL — ABNORMAL LOW (ref 30.0–36.0)
MCHC: 29.7 g/dL — ABNORMAL LOW (ref 30.0–36.0)
MCV: 86.4 fL (ref 78.0–100.0)
MCV: 87.1 fL (ref 78.0–100.0)
MCV: 87.5 fL (ref 78.0–100.0)
Monocytes Absolute: 0.6 10*3/uL (ref 0.1–1.0)
Monocytes Absolute: 1.3 10*3/uL — ABNORMAL HIGH (ref 0.1–1.0)
Monocytes Absolute: 1.5 10*3/uL — ABNORMAL HIGH (ref 0.1–1.0)
Monocytes Relative: 5 %
Monocytes Relative: 6 %
Monocytes Relative: 7 %
Neutro Abs: 9.7 10*3/uL — ABNORMAL HIGH (ref 1.7–7.7)
Neutro Abs: 19.5 10*3/uL — ABNORMAL HIGH (ref 1.7–7.7)
Neutro Abs: 19.3 10*3/uL — ABNORMAL HIGH (ref 1.7–7.7)
Neutrophils Relative %: 87 %
Neutrophils Relative %: 92 %
Neutrophils Relative %: 90 %
Platelets: 276 10*3/uL (ref 150–400)
Platelets: 282 10*3/uL (ref 150–400)
Platelets: 278 10*3/uL (ref 150–400)
RBC: 3.02 MIL/uL — ABNORMAL LOW (ref 3.87–5.11)
RBC: 2.87 MIL/uL — ABNORMAL LOW (ref 3.87–5.11)
RBC: 2.73 MIL/uL — ABNORMAL LOW (ref 3.87–5.11)
RDW: 15.7 % — ABNORMAL HIGH (ref 11.5–15.5)
RDW: 16 % — ABNORMAL HIGH (ref 11.5–15.5)
RDW: 16.3 % — ABNORMAL HIGH (ref 11.5–15.5)
WBC: 11.1 10*3/uL — ABNORMAL HIGH (ref 4.0–10.5)
WBC: 21.1 10*3/uL — ABNORMAL HIGH (ref 4.0–10.5)
WBC: 21.4 10*3/uL — ABNORMAL HIGH (ref 4.0–10.5)

## 2015-08-06 LAB — LIPASE, BLOOD: Lipase: 25 U/L (ref 11–51)

## 2015-08-06 LAB — URINALYSIS, ROUTINE W REFLEX MICROSCOPIC
Glucose, UA: 100 mg/dL — AB
Hgb urine dipstick: NEGATIVE
Ketones, ur: NEGATIVE mg/dL
Nitrite: NEGATIVE
Protein, ur: NEGATIVE mg/dL
Specific Gravity, Urine: 1.018 (ref 1.005–1.030)
pH: 5 (ref 5.0–8.0)

## 2015-08-06 LAB — COMPREHENSIVE METABOLIC PANEL
ALT: 12 U/L — ABNORMAL LOW (ref 14–54)
AST: 16 U/L (ref 15–41)
Albumin: 3.3 g/dL — ABNORMAL LOW (ref 3.5–5.0)
Alkaline Phosphatase: 57 U/L (ref 38–126)
Anion gap: 8 (ref 5–15)
BUN: 70 mg/dL — ABNORMAL HIGH (ref 6–20)
CO2: 26 mmol/L (ref 22–32)
Calcium: 8.8 mg/dL — ABNORMAL LOW (ref 8.9–10.3)
Chloride: 104 mmol/L (ref 101–111)
Creatinine, Ser: 1.86 mg/dL — ABNORMAL HIGH (ref 0.44–1.00)
GFR calc Af Amer: 30 mL/min — ABNORMAL LOW (ref >60)
GFR calc non Af Amer: 26 mL/min — ABNORMAL LOW (ref >60)
Glucose, Bld: 265 mg/dL — ABNORMAL HIGH (ref 65–99)
Potassium: 5.6 mmol/L — ABNORMAL HIGH (ref 3.5–5.1)
Sodium: 138 mmol/L (ref 135–145)
Total Bilirubin: 0.6 mg/dL (ref 0.3–1.2)
Total Protein: 6.2 g/dL — ABNORMAL LOW (ref 6.5–8.1)

## 2015-08-06 LAB — D-DIMER, QUANTITATIVE: D-Dimer, Quant: 1.44 ug/mL-FEU — ABNORMAL HIGH (ref 0.00–0.50)

## 2015-08-06 LAB — BASIC METABOLIC PANEL
Anion gap: 10 (ref 5–15)
Anion gap: 12 (ref 5–15)
BUN: 69 mg/dL — ABNORMAL HIGH (ref 6–20)
BUN: 71 mg/dL — ABNORMAL HIGH (ref 6–20)
CO2: 22 mmol/L (ref 22–32)
CO2: 20 mmol/L — ABNORMAL LOW (ref 22–32)
Calcium: 8.1 mg/dL — ABNORMAL LOW (ref 8.9–10.3)
Calcium: 7.8 mg/dL — ABNORMAL LOW (ref 8.9–10.3)
Chloride: 108 mmol/L (ref 101–111)
Chloride: 104 mmol/L (ref 101–111)
Creatinine, Ser: 1.87 mg/dL — ABNORMAL HIGH (ref 0.44–1.00)
Creatinine, Ser: 2.28 mg/dL — ABNORMAL HIGH (ref 0.44–1.00)
GFR calc Af Amer: 29 mL/min — ABNORMAL LOW (ref >60)
GFR calc Af Amer: 23 mL/min — ABNORMAL LOW (ref >60)
GFR calc non Af Amer: 25 mL/min — ABNORMAL LOW (ref >60)
GFR calc non Af Amer: 20 mL/min — ABNORMAL LOW (ref >60)
Glucose, Bld: 250 mg/dL — ABNORMAL HIGH (ref 65–99)
Glucose, Bld: 271 mg/dL — ABNORMAL HIGH (ref 65–99)
Potassium: 5.1 mmol/L (ref 3.5–5.1)
Potassium: 5.2 mmol/L — ABNORMAL HIGH (ref 3.5–5.1)
Sodium: 140 mmol/L (ref 135–145)
Sodium: 136 mmol/L (ref 135–145)

## 2015-08-06 LAB — I-STAT TROPONIN, ED
Troponin i, poc: 0.01 ng/mL (ref 0.00–0.08)
Troponin i, poc: 0.02 ng/mL (ref 0.00–0.08)

## 2015-08-06 LAB — MRSA PCR SCREENING: MRSA by PCR: NEGATIVE

## 2015-08-06 LAB — LACTIC ACID, PLASMA: Lactic Acid, Venous: 1.5 mmol/L (ref 0.5–2.0)

## 2015-08-06 LAB — BRAIN NATRIURETIC PEPTIDE: B Natriuretic Peptide: 233.6 pg/mL — ABNORMAL HIGH (ref 0.0–100.0)

## 2015-08-06 LAB — TROPONIN I
Troponin I: <0.03 ng/mL (ref <0.031)
Troponin I: 0.04 ng/mL — ABNORMAL HIGH (ref <0.031)

## 2015-08-06 LAB — GLUCOSE, CAPILLARY
Glucose-Capillary: 191 mg/dL — ABNORMAL HIGH (ref 65–99)
Glucose-Capillary: 207 mg/dL — ABNORMAL HIGH (ref 65–99)
Glucose-Capillary: 234 mg/dL — ABNORMAL HIGH (ref 65–99)
Glucose-Capillary: 250 mg/dL — ABNORMAL HIGH (ref 65–99)

## 2015-08-06 LAB — PROCALCITONIN: Procalcitonin: 1.04 ng/mL

## 2015-08-06 MED ORDER — DOXAZOSIN MESYLATE 4 MG PO TABS
4.0000 mg | ORAL_TABLET | Freq: Every day | ORAL | Status: DC
  Filled 2015-08-06: qty 1

## 2015-08-06 MED ORDER — ASPIRIN EC 325 MG PO TBEC
325.0000 mg | DELAYED_RELEASE_TABLET | Freq: Every day | ORAL | Status: DC
Start: 2015-08-06 — End: 2015-08-12
  Administered 2015-08-06 – 2015-08-11 (×6): 325 mg via ORAL
  Filled 2015-08-06 (×7): qty 1

## 2015-08-06 MED ORDER — PRAVASTATIN SODIUM 40 MG PO TABS
40.0000 mg | ORAL_TABLET | Freq: Every day | ORAL | Status: DC
  Administered 2015-08-06 – 2015-08-13 (×8): 40 mg via ORAL
  Filled 2015-08-06 (×8): qty 1

## 2015-08-06 MED ORDER — SODIUM CHLORIDE 0.9 % IV BOLUS (SEPSIS)
500.0000 mL | Freq: Once | INTRAVENOUS | Status: AC
  Administered 2015-08-06: 500 mL via INTRAVENOUS

## 2015-08-06 MED ORDER — FENTANYL CITRATE (PF) 100 MCG/2ML IJ SOLN
25.0000 ug | INTRAMUSCULAR | Status: DC | PRN
  Administered 2015-08-06: 25 ug via INTRAVENOUS
  Administered 2015-08-06 (×2): 50 ug via INTRAVENOUS
  Filled 2015-08-06 (×3): qty 2

## 2015-08-06 MED ORDER — OXYCODONE-ACETAMINOPHEN 5-325 MG PO TABS
1.0000 | ORAL_TABLET | Freq: Once | ORAL | Status: AC
  Administered 2015-08-06: 1 via ORAL
  Filled 2015-08-06: qty 1

## 2015-08-06 MED ORDER — HYDRALAZINE HCL 20 MG/ML IJ SOLN: 10.0000 mg | INTRAMUSCULAR | Status: DC | PRN

## 2015-08-06 MED ORDER — SODIUM CHLORIDE 0.9 % IV BOLUS (SEPSIS)
500.0000 mL | Freq: Once | INTRAVENOUS | Status: AC
Start: 2015-08-06 — End: 2015-08-06
  Administered 2015-08-06: 500 mL via INTRAVENOUS

## 2015-08-06 MED ORDER — ACETAMINOPHEN 325 MG PO TABS
650.0000 mg | ORAL_TABLET | ORAL | Status: DC | PRN
  Administered 2015-08-09 – 2015-08-12 (×2): 650 mg via ORAL
  Filled 2015-08-06 (×2): qty 2

## 2015-08-06 MED ORDER — SODIUM CHLORIDE 0.9 % IV BOLUS (SEPSIS): 500.0000 mL | Freq: Once | INTRAVENOUS | Status: DC

## 2015-08-06 MED ORDER — FENTANYL CITRATE (PF) 100 MCG/2ML IJ SOLN
50.0000 ug | Freq: Once | INTRAMUSCULAR | Status: AC
  Administered 2015-08-06: 50 ug via INTRAVENOUS
  Filled 2015-08-06: qty 2

## 2015-08-06 MED ORDER — SODIUM CHLORIDE 0.9 % IV SOLN: Freq: Once | INTRAVENOUS | Status: DC

## 2015-08-06 MED ORDER — DEXTROSE 50 % IV SOLN
1.0000 | Freq: Once | INTRAVENOUS | Status: AC
  Administered 2015-08-06: 50 mL via INTRAVENOUS
  Filled 2015-08-06: qty 50

## 2015-08-06 MED ORDER — HEPARIN BOLUS VIA INFUSION
4000.0000 [IU] | Freq: Once | INTRAVENOUS | Status: DC
  Filled 2015-08-06: qty 4000

## 2015-08-06 MED ORDER — HYDROXYCHLOROQUINE SULFATE 200 MG PO TABS
400.0000 mg | ORAL_TABLET | Freq: Every day | ORAL | Status: DC
  Administered 2015-08-06 – 2015-08-13 (×8): 400 mg via ORAL
  Filled 2015-08-06 (×7): qty 2

## 2015-08-06 MED ORDER — GI COCKTAIL ~~LOC~~
30.0000 mL | Freq: Once | ORAL | Status: AC
  Administered 2015-08-06: 30 mL via ORAL
  Filled 2015-08-06: qty 30

## 2015-08-06 MED ORDER — ASPIRIN 81 MG PO CHEW
324.0000 mg | CHEWABLE_TABLET | Freq: Once | ORAL | Status: AC
  Administered 2015-08-06: 324 mg via ORAL
  Filled 2015-08-06: qty 4

## 2015-08-06 MED ORDER — ONDANSETRON HCL 4 MG/2ML IJ SOLN
4.0000 mg | Freq: Four times a day (QID) | INTRAMUSCULAR | Status: DC | PRN
  Administered 2015-08-12 – 2015-08-13 (×2): 4 mg via INTRAVENOUS
  Filled 2015-08-06 (×2): qty 2

## 2015-08-06 MED ORDER — PENTOXIFYLLINE ER 400 MG PO TBCR
400.0000 mg | EXTENDED_RELEASE_TABLET | Freq: Every day | ORAL | Status: DC
  Administered 2015-08-06 – 2015-08-13 (×8): 400 mg via ORAL
  Filled 2015-08-06 (×9): qty 1

## 2015-08-06 MED ORDER — SODIUM CHLORIDE 0.9 % IV SOLN
INTRAVENOUS | Status: DC
  Administered 2015-08-07 – 2015-08-09 (×2): via INTRAVENOUS

## 2015-08-06 MED ORDER — FENTANYL CITRATE (PF) 100 MCG/2ML IJ SOLN
25.0000 ug | INTRAMUSCULAR | Status: DC | PRN
  Administered 2015-08-06: 25 ug via INTRAVENOUS
  Filled 2015-08-06: qty 2

## 2015-08-06 MED ORDER — MONTELUKAST SODIUM 10 MG PO TABS
10.0000 mg | ORAL_TABLET | Freq: Every day | ORAL | Status: DC
  Administered 2015-08-06 – 2015-08-12 (×7): 10 mg via ORAL
  Filled 2015-08-06 (×7): qty 1

## 2015-08-06 MED ORDER — FEBUXOSTAT 40 MG PO TABS
40.0000 mg | ORAL_TABLET | Freq: Every day | ORAL | Status: DC
  Administered 2015-08-06 – 2015-08-13 (×8): 40 mg via ORAL
  Filled 2015-08-06 (×8): qty 1

## 2015-08-06 MED ORDER — INSULIN ASPART 100 UNIT/ML ~~LOC~~ SOLN
0.0000 [IU] | SUBCUTANEOUS | Status: DC
  Administered 2015-08-06 (×2): 3 [IU] via SUBCUTANEOUS
  Administered 2015-08-06 – 2015-08-07 (×2): 2 [IU] via SUBCUTANEOUS
  Administered 2015-08-07 (×2): 3 [IU] via SUBCUTANEOUS
  Administered 2015-08-07 (×2): 1 [IU] via SUBCUTANEOUS
  Administered 2015-08-07: 2 [IU] via SUBCUTANEOUS

## 2015-08-06 MED ORDER — INSULIN ASPART 100 UNIT/ML ~~LOC~~ SOLN
10.0000 [IU] | Freq: Once | SUBCUTANEOUS | Status: AC
Start: 2015-08-06 — End: 2015-08-06
  Administered 2015-08-06: 10 [IU] via INTRAVENOUS
  Filled 2015-08-06: qty 1

## 2015-08-06 MED ORDER — PANTOPRAZOLE SODIUM 40 MG PO TBEC
40.0000 mg | DELAYED_RELEASE_TABLET | Freq: Every day | ORAL | Status: DC
  Administered 2015-08-06 – 2015-08-12 (×7): 40 mg via ORAL
  Filled 2015-08-06 (×7): qty 1

## 2015-08-06 MED ORDER — TECHNETIUM TC 99M DIETHYLENETRIAME-PENTAACETIC ACID: 31.8000 | Freq: Once | INTRAVENOUS | Status: DC | PRN

## 2015-08-06 MED ORDER — IRBESARTAN 300 MG PO TABS: 300.0000 mg | ORAL_TABLET | Freq: Every day | ORAL | Status: DC

## 2015-08-06 MED ORDER — HEPARIN (PORCINE) IN NACL 100-0.45 UNIT/ML-% IJ SOLN: 1250.0000 [IU]/h | INTRAMUSCULAR | Status: DC

## 2015-08-06 MED ORDER — VANCOMYCIN HCL 10 G IV SOLR
1250.0000 mg | INTRAVENOUS | Status: DC
  Administered 2015-08-06: 1250 mg via INTRAVENOUS
  Filled 2015-08-06: qty 1250

## 2015-08-06 MED ORDER — LINAGLIPTIN 5 MG PO TABS
5.0000 mg | ORAL_TABLET | Freq: Every day | ORAL | Status: DC
Start: 2015-08-06 — End: 2015-08-13
  Administered 2015-08-06 – 2015-08-13 (×8): 5 mg via ORAL
  Filled 2015-08-06 (×9): qty 1

## 2015-08-06 MED ORDER — TECHNETIUM TO 99M ALBUMIN AGGREGATED
4.3000 | Freq: Once | INTRAVENOUS | Status: AC | PRN
  Administered 2015-08-06: 4 via INTRAVENOUS

## 2015-08-06 MED ORDER — SODIUM CHLORIDE 0.9 % IV SOLN
Freq: Once | INTRAVENOUS | Status: AC
  Administered 2015-08-06: 250 mL/h via INTRAVENOUS

## 2015-08-06 MED ORDER — SODIUM POLYSTYRENE SULFONATE 15 GM/60ML PO SUSP
30.0000 g | Freq: Once | ORAL | Status: AC
  Administered 2015-08-06: 30 g via ORAL
  Filled 2015-08-06: qty 120

## 2015-08-06 MED ORDER — GABAPENTIN 300 MG PO CAPS
300.0000 mg | ORAL_CAPSULE | Freq: Two times a day (BID) | ORAL | Status: DC
  Administered 2015-08-06 – 2015-08-13 (×14): 300 mg via ORAL
  Filled 2015-08-06 (×14): qty 1

## 2015-08-06 MED ORDER — PIPERACILLIN-TAZOBACTAM 3.375 G IVPB 30 MIN
3.3750 g | INTRAVENOUS | Status: AC
  Administered 2015-08-06: 3.375 g via INTRAVENOUS
  Filled 2015-08-06: qty 50

## 2015-08-06 MED ORDER — HEPARIN (PORCINE) IN NACL 100-0.45 UNIT/ML-% IJ SOLN
1100.0000 [IU]/h | INTRAMUSCULAR | Status: DC
  Filled 2015-08-06: qty 250

## 2015-08-06 MED ORDER — FUROSEMIDE 40 MG PO TABS: 40.0000 mg | ORAL_TABLET | Freq: Every day | ORAL | Status: DC

## 2015-08-06 MED ORDER — SODIUM CHLORIDE 0.9 % IV BOLUS (SEPSIS)
1000.0000 mL | Freq: Once | INTRAVENOUS | Status: AC
  Administered 2015-08-06: 1000 mL via INTRAVENOUS

## 2015-08-06 MED ORDER — SODIUM CHLORIDE 0.9 % IV SOLN: INTRAVENOUS | Status: DC

## 2015-08-06 MED ORDER — PANTOPRAZOLE SODIUM 40 MG IV SOLR
40.0000 mg | Freq: Once | INTRAVENOUS | Status: AC
  Administered 2015-08-06: 40 mg via INTRAVENOUS
  Filled 2015-08-06: qty 40

## 2015-08-06 MED ORDER — PIPERACILLIN-TAZOBACTAM 3.375 G IVPB
3.3750 g | Freq: Three times a day (TID) | INTRAVENOUS | Status: DC
  Filled 2015-08-06: qty 50

## 2015-08-06 MED ORDER — DOCUSATE SODIUM 100 MG PO CAPS
100.0000 mg | ORAL_CAPSULE | ORAL | Status: DC
  Administered 2015-08-06 – 2015-08-12 (×4): 100 mg via ORAL
  Filled 2015-08-06 (×6): qty 1

## 2015-08-06 NOTE — ED Notes (Signed)
Spoke with MD Broadus John and placed orders.

## 2015-08-06 NOTE — ED Notes (Signed)
Patient taken to NM. Pt remains on the cardiac monitor and BP. Magda Paganini, RN at [facility] given report and verbalized understanding of the Pt.

## 2015-08-06 NOTE — H&P (Signed)
[facility] History and Physical  BRYNA RAZAVI ZOX:[id number] DOB: 1940-09-24 DOA: 08/06/2015  Referring physician: Dr.Horton. PCP: Reginia Naas, MD  Specialists: Dr.Allred   Chief Complaint: Chest pain.  HPI: Tricia Gonzales is a 75 y.o. 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 Patient has been having chest pain since yesterday morning. Pain is only on deep breathing. Denies any associated productive cough shortness of breath fever chills. Chest x-ray did not show anything acute and cardiac markers were negative but d-dimer was elevated. Patient has been admitted for further management.   Review of Systems: As presented in the history of presenting illness, rest negative.  Past Medical History  Diagnosis Date  . Diabetes mellitus   . Hypertension   . Obesity   . Sjogren's disease ([place])   . Hyperlipidemia   . Anemia   . Diverticulosis   . Osteoarthritis   . Intestinal obstruction ([place])   . Hiatal hernia   . Esophageal stricture   . Vitamin B12 deficiency    Past Surgical History  Procedure Laterality Date  . Joint replacement      knee x 3  . Abdominal hysterectomy    . Colon resection      for rectovaginal fistula  . Cholecystectomy    . Colonoscopy  01/07/2012    Procedure: COLONOSCOPY;  Surgeon: Lafayette Dragon, MD;  Location: [facility];  Service: Endoscopy;  Laterality: N/A;  . Ep implantable device N/A 06/18/2015    Procedure: Pacemaker Implant;  Surgeon: Evans Lance, MD;  Location: [facility];  Service: Cardiovascular;  Laterality: N/A;   Social History:  reports that she quit smoking about 32 years ago. She has never used smokeless tobacco. She reports that she drinks alcohol. She reports that she does not use illicit  drugs. Where does patient live at home. Can patient participate in ADLs? Yes.  Allergies  Allergen Reactions  . Norvasc [Amlodipine Besylate] Shortness Of Breath  . Glucophage [Metformin Hydrochloride] Other (See Comments)    "increases creatinine"  . Lisinopril     Headache   . Losartan     Headache   . Metoprolol Other (See Comments)    Passed out  . Morphine And Related Other (See Comments)    "hallucinations"  . Simvastatin Other (See Comments)    Body aches  . Trulicity [Dulaglutide] Other (See Comments)    Severe mood swings    Family History:  Family History  Problem Relation Age of Onset  . Diabetes Mother   . Diabetes Brother   . Colon cancer Neg Hx   . Hypertension Sister       Prior to Admission medications   Medication Sig Start Date End Date Taking? Authorizing Provider  acetaminophen (TYLENOL) 500 MG tablet Take 1,500 mg by mouth at bedtime.    Yes Historical Provider, MD  aspirin EC 81 MG tablet Take 81 mg by mouth daily.   Yes Historical Provider, MD  cholecalciferol (VITAMIN D) 1000 UNITS tablet Take 2,000 Units by mouth at bedtime.   Yes Historical Provider, MD  denosumab (PROLIA) 60 MG/ML SOLN injection Inject 60 mg into the skin every 6 (six) months. Administer in upper arm, thigh, or abdomen   Yes Historical Provider, MD  docusate sodium (COLACE) 100 MG capsule  Take 100 mg by mouth every other day.   Yes Historical Provider, MD  doxazosin (CARDURA) 4 MG tablet Take 4 mg by mouth daily.   Yes Historical Provider, MD  furosemide (LASIX) 40 MG tablet Take 40 mg by mouth daily.   Yes Historical Provider, MD  gabapentin (NEURONTIN) 300 MG capsule Take 300 mg by mouth 2 (two) times daily.    Yes Historical Provider, MD  glipiZIDE (GLUCOTROL XL) 10 MG 24 hr tablet Take 10 mg by mouth 2 (two) times daily.   Yes Historical Provider, MD  HYDROcodone-acetaminophen (NORCO/VICODIN) 5-325 MG tablet Take 1 tablet by mouth every 6 (six) hours as needed for moderate  pain.   Yes Historical Provider, MD  hydroxychloroquine (PLAQUENIL) 200 MG tablet Take 400 mg by mouth daily.  03/16/15  Yes Historical Provider, MD  irbesartan (AVAPRO) 300 MG tablet Take 300 mg by mouth daily.   Yes Historical Provider, MD  montelukast (SINGULAIR) 10 MG tablet Take 10 mg by mouth at bedtime.    Yes Historical Provider, MD  Multiple Vitamin (MULITIVITAMIN WITH MINERALS) TABS Take 1 tablet by mouth daily.   Yes Historical Provider, MD  pantoprazole (PROTONIX) 40 MG tablet Take 40 mg by mouth daily.   Yes Historical Provider, MD  pentoxifylline (TRENTAL) 400 MG CR tablet Take 400 mg by mouth daily.   Yes Historical Provider, MD  pravastatin (PRAVACHOL) 40 MG tablet Take 40 mg by mouth daily.  03/16/15  Yes Historical Provider, MD  sitaGLIPtin (JANUVIA) 50 MG tablet Take 50 mg by mouth daily.   Yes Historical Provider, MD  ULORIC 40 MG tablet Take 40 mg by mouth daily.  03/15/15  Yes Historical Provider, MD  ACCU-CHEK AVIVA PLUS test strip 1 each by Other route daily.  03/15/15   Historical Provider, MD    Physical Exam: Filed Vitals:   08/06/15 0315 08/06/15 0400 08/06/15 0430 08/06/15 0500  BP: 117/41 121/31 114/34 118/48  Pulse: 61 74 68 68  Resp: _0 Height:    4' 11" (1.499 m)  Weight:    113.2 kg (249 lb 9 oz)  SpO2: 99% 93% 92% 95%     General:  Moderately built and nourished.  Eyes: Anicteric no pallor.  ENT: No discharge from the ears eyes nose or mouth.  Neck: No mass felt. No JVD appreciated.  Cardiovascular: S1-S2 heard.  Respiratory: No rhonchi or crepitations.  Abdomen: Soft nontender bowel sounds present. No guarding or rigidity.  Skin: No rash.  Musculoskeletal: No edema.  Psychiatric: Appears normal.  Neurologic: Alert awake oriented to time place and person. Moves all extremities.  Labs on Admission:  Basic Metabolic Panel:  Recent Labs Lab 08/06/15 0146  NA 138  K 5.6*  CL 104  CO2 26  GLUCOSE 265*  BUN 70*  CREATININE  1.86*  CALCIUM 8.8*   Liver Function Tests:  Recent Labs Lab 08/06/15 0146  AST 16  ALT 12*  ALKPHOS 57  BILITOT 0.6  PROT 6.2*  ALBUMIN 3.3*    Recent Labs Lab 08/06/15 0146  LIPASE 25   No results for input(s): AMMONIA in the last 168 hours. CBC:  Recent Labs Lab 08/06/15 0146  WBC 11.1*  NEUTROABS 9.7*  HGB 7.8*  HCT 26.1*  MCV 86.4  PLT 276   Cardiac Enzymes: No results for input(s): CKTOTAL, CKMB, CKMBINDEX, TROPONINI in the last 168 hours.  BNP (last 3 results)  Recent Labs  08/06/15 0146  BNP 233.6*  ProBNP (last 3 results) No results for input(s): PROBNP in the last 8760 hours.  CBG: No results for input(s): GLUCAP in the last 168 hours.  Radiological Exams on Admission: Dg Chest Portable 1 View  08/06/2015  CLINICAL DATA:  Shortness of breath. Chest pain on inspiration for 1 day. EXAM: PORTABLE CHEST 1 VIEW COMPARISON:  06/19/2015 FINDINGS: Cardiac pacemaker. Shallow inspiration. Mild cardiac enlargement without significant vascular congestion. No focal airspace disease or consolidation in the lungs. No blunting of costophrenic angles. No pneumothorax. Calcified and tortuous aorta. IMPRESSION: Mild cardiac enlargement.  No evidence of active pulmonary disease. Electronically Signed   By: Lucienne Capers M.D.   On: 08/06/2015 01:55    EKG: Independently reviewed. Normal sinus rhythm with RBBB.  Assessment/Plan Principal Problem:   Chest pain Active Problems:   Pacemaker   Diabetes mellitus type 2, controlled (HCC)   Hyperlipidemia   Hypertension   CKD (chronic kidney disease) stage 3, GFR 30-59 ml/min   Chronic anemia   1. Chest pain, pleuritic - appears atypical. D-dimer is positive for which VQ scan has been ordered and is pending. Will also check 2-D echo since patient had recent instrumentation to rule out any pericardial effusion. Cardiology consult has been requested. Cycle cardiac markers. Patient is on aspirin and when necessary  fentanyl for pain. 2. Diabetes mellitus type 2 with hyperglycemia - patient has been placed on sliding scale coverage. Since patient is nothing by mouth glipizide is on hold. 3. Chronic kidney disease stage 3-4 with hyperkalemia - holding off ARB for now. Patient was given Kayexalate D50 and insulin. Follow metabolic panel. Patient is on Lasix. 4. Hypertension - since patient's ARB is on hold due to hyperkalemia I have placed patient on when necessary IV hydralazine. Closely follow blood pressure trends. 5. Hyperlipidemia on statins. 6. Chronic anemia - follow CBC. Hemoglobin has mildly worsened. Type and screen for now. 7. Status post pacemaker placement for Mobitz type II second-degree AV block in December 2016. 8. History of Sjogren's syndrome on hydroxychloroquine.   DVT Prophylaxis SCDs until we get 2-D echo.  Code Status: Full code.  Family Communication: Discussed with patient.  Disposition Plan: Admit for observation.    Lumina Gitto N. [facility] Pager [phone].  If 7PM-7AM, please contact night-coverage [email or web address] Password [email or web address] 08/06/2015, 6:20 AM

## 2015-08-06 NOTE — ED Notes (Signed)
Spoke with NM. Pt to be taken at the this time. Pt will be transferred to floor from [place]

## 2015-08-06 NOTE — Progress Notes (Signed)
ANTICOAGULATION CONSULT NOTE - Initial Consult  Pharmacy Consult for heparin Indication: r/o PE  Allergies  Allergen Reactions  . Norvasc [Amlodipine Besylate] Shortness Of Breath  . Glucophage [Metformin Hydrochloride] Other (See Comments)    "increases creatinine"  . Lisinopril     Headache   . Losartan     Headache   . Metoprolol Other (See Comments)    Passed out  . Morphine And Related Other (See Comments)    "hallucinations"  . Simvastatin Other (See Comments)    Body aches  . Trulicity [Dulaglutide] Other (See Comments)    Severe mood swings    Patient Measurements: Height: _0  (149.9 cm) Weight: 249 lb 9 oz (113.2 kg) IBW/kg (Calculated) : 43.2 Heparin Dosing Weight: 75kg  Vital Signs: BP: 114/34 mmHg (02/14 0430) Pulse Rate: 68 (02/14 0430)  Labs:  Recent Labs  08/06/15 0146  HGB 7.8*  HCT 26.1*  PLT 276  CREATININE 1.86*    Estimated Creatinine Clearance: 29.8 mL/min (by C-G formula based on Cr of 1.86).   Medical History: Past Medical History  Diagnosis Date  . Diabetes mellitus   . Hypertension   . Obesity   . Sjogren's disease ([place])   . Hyperlipidemia   . Anemia   . Diverticulosis   . Osteoarthritis   . Intestinal obstruction ([place])   . Hiatal hernia   . Esophageal stricture   . Vitamin B12 deficiency      Assessment: 75yo female c/o CP and SOB, no relief w/ NTG given by EMS, D-dimer elevated, awaiting VQ scan, to begin heparin.  Goal of Therapy:  Heparin level 0.3-0.7 units/ml Monitor platelets by anticoagulation protocol: Yes   Plan:  Will give heparin 4000 units IV bolus x1 followed by gtt at 1100 units/hr and monitor heparin levels and CBC.  Wynona Neat, PharmD, BCPS  08/06/2015,5:15 AM

## 2015-08-06 NOTE — ED Notes (Signed)
MD Cardiology Croitoru at the bedside speaking family

## 2015-08-06 NOTE — Progress Notes (Signed)
Pharmacy Antibiotic Note  Tricia Gonzales is a 75 y.o. female admitted on 08/06/2015 who was recently admitted in December 2016 and had pacemaker placed presents to the ER because of chest pain. Patient has been having chest pain since yesterday morning. Pain is only on deep breathing, Ddimer elevated 1.44 but  VQ neg for PE. Her WBC jumped to 21 Tm 99 will treat for  sepsis.   Pharmacy has been consulted for Zosyn and vancomycin dosing dosing.  Cr 1.9 Crcl approx 22m/min - about the same as admit in Dec  Plan: Zosyn 3.375GM x1 over 378m for sepsis Then Zosyn 3.375gm IV q8 EI Vancomycin 12505mV q24hr  Height: 4' 11" (149.9 cm) Weight: 249 lb 9 oz (113.2 kg) IBW/kg (Calculated) : 43.2  Temp (24hrs), Avg:98.6 F (37 C), Min:97.9 F (36.6 C), Max:99.8 F (37.7 C)   Recent Labs Lab 08/06/15 0146 08/06/15 1321  WBC 11.1* 21.1*  CREATININE 1.86* 1.87*    Estimated Creatinine Clearance: 29.7 mL/min (by C-G formula based on Cr of 1.87).    Allergies  Allergen Reactions  . Norvasc [Amlodipine Besylate] Shortness Of Breath  . Glucophage [Metformin Hydrochloride] Other (See Comments)    "increases creatinine"  . Lisinopril     Headache   . Losartan     Headache   . Metoprolol Other (See Comments)    Passed out  . Morphine And Related Other (See Comments)    "hallucinations"  . Simvastatin Other (See Comments)    Body aches  . Trulicity [Dulaglutide] Other (See Comments)    Severe mood swings    Antimicrobials this admission: 2/14 vanc >>  2/14 Zosyn >>   Dose adjustments this admission:   Microbiology results: 2/14 BCx:  2/14 UCx: Sputum:   MRSA PCR:   LisBonnita Nasutiarm.D. CPP, BCPS Clinical Pharmacist [phone] 9:02 PM

## 2015-08-06 NOTE — ED Notes (Signed)
ECHO at the bedside.

## 2015-08-06 NOTE — ED Notes (Signed)
EKG given to [facility]. Pt's Chest pain increased with BP dropping

## 2015-08-06 NOTE — ED Notes (Signed)
Attempted Report x1.

## 2015-08-06 NOTE — ED Notes (Addendum)
Pt complained of increasing chest pain and back pain. Some elevation noted on Cardiac monitor.  Pt pale. EKG taken and MD [facility] made aware of patient and changes.  Pt is alert and oriented. Pt remains on the monitor. MD Campos at the bedside.

## 2015-08-06 NOTE — ED Notes (Signed)
MD [facility] made aware of patient's BP dropping again. Verbalized to give patient another bolus. ECHO called and Campos requested for STAT ECHO. Cardiology paged.

## 2015-08-06 NOTE — ED Notes (Signed)
Myself and Jarrett Soho, RN readjusted patient on stretcher

## 2015-08-06 NOTE — ED Provider Notes (Signed)
CSN: [id number]     Arrival date & time 08/06/15  0119 History  By signing my name below, I, Altamease Oiler, attest that this documentation has been prepared under the direction and in the presence of Merryl Hacker, MD. Electronically Signed: Altamease Oiler, ED Scribe. 08/06/2015. 2:05 AM   Chief Complaint  Patient presents with  . Chest Pain   The history is provided by the patient. No language interpreter was used.   Tricia Gonzales is a 75 y.o. female with PMHx of HTN, DM, HLD, and obesity with a demand pacemaker who presents to the Emergency Department complaining of new, constant,8/10 in severity, chest pain with onset approximately 21 hours ago. The pain is dull generally and sharp with deep breathing. Initially she thought it might be heartburn but it doesn't get better or worse with eating. She notes that the pain started at the mid back. Associated symptoms include SOB. Pt denies LE swelling, cough, nausea, and vomiting. She used 81 mg of aspirin at home. Pt denies smoking.  Past Medical History  Diagnosis Date  . Diabetes mellitus   . Hypertension   . Obesity   . Sjogren's disease ([place])   . Hyperlipidemia   . Anemia   . Diverticulosis   . Osteoarthritis   . Intestinal obstruction ([place])   . Hiatal hernia   . Esophageal stricture   . Vitamin B12 deficiency    Past Surgical History  Procedure Laterality Date  . Joint replacement      knee x 3  . Abdominal hysterectomy    . Colon resection      for rectovaginal fistula  . Cholecystectomy    . Colonoscopy  01/07/2012    Procedure: COLONOSCOPY;  Surgeon: Lafayette Dragon, MD;  Location: [facility];  Service: Endoscopy;  Laterality: N/A;  . Ep implantable device N/A 06/18/2015    Procedure: Pacemaker Implant;  Surgeon: Evans Lance, MD;  Location: [place] [facility];  Service: Cardiovascular;  Laterality: N/A;   Family History  Problem Relation Age of Onset  . Diabetes Mother   . Diabetes Brother   . Colon cancer  Neg Hx   . Hypertension Sister    Social History  Substance Use Topics  . Smoking status: Former Smoker    Quit date: 06/23/1983  . Smokeless tobacco: Never Used  . Alcohol Use: Yes     Comment: occasionally   OB History    No data available     Review of Systems  Respiratory: Positive for shortness of breath. Negative for cough.   Cardiovascular: Positive for chest pain. Negative for leg swelling.  Gastrointestinal: Negative for nausea and vomiting.  All other systems reviewed and are negative.     Allergies  Norvasc; Glucophage; Lisinopril; Losartan; Metoprolol; Morphine and related; Simvastatin; and Trulicity  Home Medications   Prior to Admission medications   Medication Sig Start Date End Date Taking? Authorizing Provider  acetaminophen (TYLENOL) 500 MG tablet Take 1,500 mg by mouth at bedtime.    Yes Historical Provider, MD  aspirin EC 81 MG tablet Take 81 mg by mouth daily.   Yes Historical Provider, MD  cholecalciferol (VITAMIN D) 1000 UNITS tablet Take 2,000 Units by mouth at bedtime.   Yes Historical Provider, MD  denosumab (PROLIA) 60 MG/ML SOLN injection Inject 60 mg into the skin every 6 (six) months. Administer in upper arm, thigh, or abdomen   Yes Historical Provider, MD  docusate sodium (COLACE) 100 MG capsule Take 100  mg by mouth every other day.   Yes Historical Provider, MD  doxazosin (CARDURA) 4 MG tablet Take 4 mg by mouth daily.   Yes Historical Provider, MD  furosemide (LASIX) 40 MG tablet Take 40 mg by mouth daily.   Yes Historical Provider, MD  gabapentin (NEURONTIN) 300 MG capsule Take 300 mg by mouth 2 (two) times daily.    Yes Historical Provider, MD  glipiZIDE (GLUCOTROL XL) 10 MG 24 hr tablet Take 10 mg by mouth 2 (two) times daily.   Yes Historical Provider, MD  HYDROcodone-acetaminophen (NORCO/VICODIN) 5-325 MG tablet Take 1 tablet by mouth every 6 (six) hours as needed for moderate pain.   Yes Historical Provider, MD  hydroxychloroquine  (PLAQUENIL) 200 MG tablet Take 400 mg by mouth daily.  03/16/15  Yes Historical Provider, MD  irbesartan (AVAPRO) 300 MG tablet Take 300 mg by mouth daily.   Yes Historical Provider, MD  montelukast (SINGULAIR) 10 MG tablet Take 10 mg by mouth at bedtime.    Yes Historical Provider, MD  Multiple Vitamin (MULITIVITAMIN WITH MINERALS) TABS Take 1 tablet by mouth daily.   Yes Historical Provider, MD  pantoprazole (PROTONIX) 40 MG tablet Take 40 mg by mouth daily.   Yes Historical Provider, MD  pentoxifylline (TRENTAL) 400 MG CR tablet Take 400 mg by mouth daily.   Yes Historical Provider, MD  pravastatin (PRAVACHOL) 40 MG tablet Take 40 mg by mouth daily.  03/16/15  Yes Historical Provider, MD  sitaGLIPtin (JANUVIA) 50 MG tablet Take 50 mg by mouth daily.   Yes Historical Provider, MD  ULORIC 40 MG tablet Take 40 mg by mouth daily.  03/15/15  Yes Historical Provider, MD  ACCU-CHEK AVIVA PLUS test strip 1 each by Other route daily.  03/15/15   Historical Provider, MD   BP 118/48 mmHg  Pulse 68  Resp 22  Ht _0  (1.499 m)  Wt 249 lb 9 oz (113.2 kg)  BMI 50.38 kg/m2  SpO2 95% Physical Exam  Constitutional: She is oriented to person, place, and time. She appears well-developed and well-nourished.  Obese  HENT:  Head: Normocephalic and atraumatic.  Cardiovascular: Normal rate, regular rhythm and normal heart sounds.   No murmur heard. Pulmonary/Chest: Effort normal and breath sounds normal. No respiratory distress. She has no wheezes. She has no rales. She exhibits no tenderness.  Abdominal: Soft. Bowel sounds are normal. There is no tenderness. There is no rebound.  Musculoskeletal:  1+ bilateral lower extremity edema  Neurological: She is alert and oriented to person, place, and time.  Skin: Skin is warm and dry.  Psychiatric: She has a normal mood and affect.  Nursing note and vitals reviewed.   ED Course  Procedures (including critical care time) DIAGNOSTIC STUDIES: Oxygen Saturation  is 95% on RA,  normal by my interpretation.    COORDINATION OF CARE: 2:04 AM Discussed treatment plan which includes lab work, CXR, EKG with pt at bedside and pt agreed to plan.  Labs Review Labs Reviewed  COMPREHENSIVE METABOLIC PANEL - Abnormal; Notable for the following:    Potassium 5.6 (*)    Glucose, Bld 265 (*)    BUN 70 (*)    Creatinine, Ser 1.86 (*)    Calcium 8.8 (*)    Total Protein 6.2 (*)    Albumin 3.3 (*)    ALT 12 (*)    GFR calc non Af Amer 26 (*)    GFR calc Af Amer 30 (*)    All other  components within normal limits  CBC WITH DIFFERENTIAL/PLATELET - Abnormal; Notable for the following:    WBC 11.1 (*)    RBC 3.02 (*)    Hemoglobin 7.8 (*)    HCT 26.1 (*)    MCH 25.8 (*)    MCHC 29.9 (*)    RDW 15.7 (*)    Neutro Abs 9.7 (*)    All other components within normal limits  D-DIMER, QUANTITATIVE (NOT AT [facility]) - Abnormal; Notable for the following:    D-Dimer, Quant 1.44 (*)    All other components within normal limits  BRAIN NATRIURETIC PEPTIDE - Abnormal; Notable for the following:    B Natriuretic Peptide 233.6 (*)    All other components within normal limits  LIPASE, BLOOD  I-STAT TROPOININ, ED    Imaging Review Dg Chest Portable 1 View  08/06/2015  CLINICAL DATA:  Shortness of breath. Chest pain on inspiration for 1 day. EXAM: PORTABLE CHEST 1 VIEW COMPARISON:  06/19/2015 FINDINGS: Cardiac pacemaker. Shallow inspiration. Mild cardiac enlargement without significant vascular congestion. No focal airspace disease or consolidation in the lungs. No blunting of costophrenic angles. No pneumothorax. Calcified and tortuous aorta. IMPRESSION: Mild cardiac enlargement.  No evidence of active pulmonary disease. Electronically Signed   By: Lucienne Capers M.D.   On: 08/06/2015 01:55   I have personally reviewed and evaluated these images and lab results as part of my medical decision-making.   EKG Interpretation   Date/Time:  Tuesday August 06 2015 01:44:45  EST Ventricular Rate:  64 PR Interval:  197 QRS Duration: 149 QT Interval:  423 QTC Calculation: 436 R Axis:   102 Text Interpretation:  Sinus rhythm RBBB and LPFB morphologic changes in  V2-V4, new when compared to prior Confirmed by HORTON  MD, Troy  ([id number]) on 08/06/2015 1:49:07 AM      MDM   Final diagnoses:  Chest pain  Hyperkalemia    Patient presents with chest pain over the last 24 hours. Nontoxic on exam. Vital signs reassuring. No reproducible tenderness on exam. Pain is somewhat atypical for ACS. Patient does have risk factors including obesity, diabetes, hypertension, hyperlipidemia. EKG shows no significant changes but does show some morphologic changes in V2 through V4. Initial troponin is negative. Chest x-ray is without evidence of pneumothorax or pneumonia.  Given the pleuritic nature of the pain, d-dimer was obtained and is 1.44. Patient's kidney function does not allow for her to have a CT. She will need a VQ scan.  Patient continues to have persistent pain. Given her risk factors, will admit for chest pain rule out. . Of note, potassium was noted to be 5.6 and hemoglobin of 7.8. Patient's last hemoglobin was 8.2. Baseline 9. Denies any acute bleeding.  Patient given Kayexalate and insulin and glucose. Will need repeat potassium.  I personally performed the services described in this documentation, which was scribed in my presence. The recorded information has been reviewed and is accurate.    Merryl Hacker, MD 08/06/15 [phone]

## 2015-08-06 NOTE — ED Notes (Signed)
Report attempted 

## 2015-08-06 NOTE — ED Notes (Signed)
Jarrett Soho, RN present when manual pressure was taken

## 2015-08-06 NOTE — Progress Notes (Signed)
  Echocardiogram 2D Echocardiogram Limited  has been performed.  Tricia Gonzales M 08/06/2015, 9:01 AM

## 2015-08-06 NOTE — ED Notes (Signed)
Dr. Horton at bedside. 

## 2015-08-06 NOTE — Consult Note (Signed)
Reason for Consult: Chest pain  Requesting Physician: Broadus John  Cardiologist: Turner/Taylor - PM  HPI: [name] with a past medical history significant for second degree AV block, Mobitz type 2 and syncope s/p dual chamber PPM implantation 06/18/2015 ([employer]), chronic anemia, Sjogren's sd., obesity, type 2 DM, HTN, hypercholesterolemia presents with pleuritic chest discomfort that began around 24 hours ago. The pain is worsened by lying down and breathing deeply. No real dyspnea, but pain causes splinting. She is hypotensive, improving with IV fluids.  Denies fever, chills, sick contact, cough, leg pain, but has chronic ankle swelling.  ECG shows preexisting RBBB, LPHB and new ST elevation < 1 mm in most leads. Troponin is normal. CXR shows cardiomegaly and aortic calcification, not a particularly wide mediastinum. Similar CXR in December. D-dimer is elevated (VQ scan ordered since creatinine is 1.8, seems to be her baseline). Bedside review of echo shows a trivial pericardial effusion, normal LV regional wall motion and EF, normal RV size and function, no obvious abnormality of the ascending or arch aortamildly increased PA pressure: 45 mm Hg, similar to last echo 2013).   PMHx:  Past Medical History  Diagnosis Date  . Diabetes mellitus   . Hypertension   . Obesity   . Sjogren's disease ([place])   . Hyperlipidemia   . Anemia   . Diverticulosis   . Osteoarthritis   . Intestinal obstruction ([place])   . Hiatal hernia   . Esophageal stricture   . Vitamin B12 deficiency    Past Surgical History  Procedure Laterality Date  . Joint replacement      knee x 3  . Abdominal hysterectomy    . Colon resection      for rectovaginal fistula  . Cholecystectomy    . Colonoscopy  01/07/2012    Procedure: COLONOSCOPY;  Surgeon: Lafayette Dragon, MD;  Location: [facility];  Service: Endoscopy;  Laterality: N/A;  . Ep implantable device N/A 06/18/2015    Procedure:  Pacemaker Implant;  Surgeon: Evans Lance, MD;  Location: [place] [facility];  Service: Cardiovascular;  Laterality: N/A;    FAMHx: Family History  Problem Relation Age of Onset  . Diabetes Mother   . Diabetes Brother   . Colon cancer Neg Hx   . Hypertension Sister     SOCHx:  reports that she quit smoking about 32 years ago. She has never used smokeless tobacco. She reports that she drinks alcohol. She reports that she does not use illicit drugs.  ALLERGIES: Allergies  Allergen Reactions  . Norvasc [Amlodipine Besylate] Shortness Of Breath  . Glucophage [Metformin Hydrochloride] Other (See Comments)    "increases creatinine"  . Lisinopril     Headache   . Losartan     Headache   . Metoprolol Other (See Comments)    Passed out  . Morphine And Related Other (See Comments)    "hallucinations"  . Simvastatin Other (See Comments)    Body aches  . Trulicity [Dulaglutide] Other (See Comments)    Severe mood swings    ROS: Pertinent items noted in HPI and remainder of comprehensive ROS otherwise negative.  HOME MEDICATIONS:  No current facility-administered medications on file prior to encounter.   Current Outpatient Prescriptions on File Prior to Encounter  Medication Sig Dispense Refill  . acetaminophen (TYLENOL) 500 MG tablet Take 1,500 mg by mouth at bedtime.     Marland Kitchen aspirin EC 81 MG tablet Take 81 mg by  mouth daily.    . cholecalciferol (VITAMIN D) 1000 UNITS tablet Take 2,000 Units by mouth at bedtime.    Marland Kitchen denosumab (PROLIA) 60 MG/ML SOLN injection Inject 60 mg into the skin every 6 (six) months. Administer in upper arm, thigh, or abdomen    . docusate sodium (COLACE) 100 MG capsule Take 100 mg by mouth every other day.    . doxazosin (CARDURA) 4 MG tablet Take 4 mg by mouth daily.    . furosemide (LASIX) 40 MG tablet Take 40 mg by mouth daily.    Marland Kitchen gabapentin (NEURONTIN) 300 MG capsule Take 300 mg by mouth 2 (two) times daily.     Marland Kitchen glipiZIDE (GLUCOTROL XL) 10  MG 24 hr tablet Take 10 mg by mouth 2 (two) times daily.    Marland Kitchen HYDROcodone-acetaminophen (NORCO/VICODIN) 5-325 MG tablet Take 1 tablet by mouth every 6 (six) hours as needed for moderate pain.    . hydroxychloroquine (PLAQUENIL) 200 MG tablet Take 400 mg by mouth daily.     . irbesartan (AVAPRO) 300 MG tablet Take 300 mg by mouth daily.    . montelukast (SINGULAIR) 10 MG tablet Take 10 mg by mouth at bedtime.     . Multiple Vitamin (MULITIVITAMIN WITH MINERALS) TABS Take 1 tablet by mouth daily.    . pantoprazole (PROTONIX) 40 MG tablet Take 40 mg by mouth daily.    . pentoxifylline (TRENTAL) 400 MG CR tablet Take 400 mg by mouth daily.    . pravastatin (PRAVACHOL) 40 MG tablet Take 40 mg by mouth daily.     . sitaGLIPtin (JANUVIA) 50 MG tablet Take 50 mg by mouth daily.    Marland Kitchen ULORIC 40 MG tablet Take 40 mg by mouth daily.     Marland Kitchen ACCU-CHEK AVIVA PLUS test strip 1 each by Other route daily.        VITALS: Blood pressure 89/44, pulse 71, resp. rate 17, height _0  (1.499 m), weight 113.2 kg (249 lb 9 oz), SpO2 98 %.  PHYSICAL EXAM:  General: Alert, oriented x3, no distress Head: no evidence of trauma, PERRL, EOMI, no exophtalmos or lid lag, no myxedema, no xanthelasma; normal ears, nose and oropharynx Neck: cannot evaluate jugular venous pulsations due to obesity; brisk carotid pulses without delay and no carotid bruits Chest: clear to auscultation, no signs of consolidation by percussion or palpation, normal fremitus, symmetrical and full respiratory excursions Cardiovascular: normal position and quality of the apical impulse, regular rhythm, normal first heart sound and widely split second heart sound, no rubs or gallops, no murmur Abdomen: no tenderness or distention, no masses by palpation, no abnormal pulsatility or arterial bruits, normal bowel sounds, no hepatosplenomegaly Extremities: no clubbing, cyanosis;  2+ symmetrical ankle edema;  Neurological: grossly  nonfocal   LABS  CBC  Recent Labs  08/06/15 0146  WBC 11.1*  NEUTROABS 9.7*  HGB 7.8*  HCT 26.1*  MCV 86.4  PLT 884   Basic Metabolic Panel  Recent Labs  08/06/15 0146  NA 138  K 5.6*  CL 104  CO2 26  GLUCOSE 265*  BUN 70*  CREATININE 1.86*  CALCIUM 8.8*   Liver Function Tests  Recent Labs  08/06/15 0146  AST 16  ALT 12*  ALKPHOS 57  BILITOT 0.6  PROT 6.2*  ALBUMIN 3.3*    Recent Labs  08/06/15 0146  LIPASE 25   D-Dimer  Recent Labs  08/06/15 0147  DDIMER 1.44*   IMAGING: Dg Chest Portable 1 View  08/06/2015  CLINICAL DATA:  Shortness of breath. Chest pain on inspiration for 1 day. EXAM: PORTABLE CHEST 1 VIEW COMPARISON:  06/19/2015 FINDINGS: Cardiac pacemaker. Shallow inspiration. Mild cardiac enlargement without significant vascular congestion. No focal airspace disease or consolidation in the lungs. No blunting of costophrenic angles. No pneumothorax. Calcified and tortuous aorta. IMPRESSION: Mild cardiac enlargement.  No evidence of active pulmonary disease. Electronically Signed   By: Lucienne Capers M.D.   On: 08/06/2015 01:55    ECG: NSR, RBBB, LPFB, < 33m ST elevation in the inferior and anterolateral leads, not obvious in I and aVL  TELEMETRY: NSR  IMPRESSION/RECOMMENDATION: 1. Probable acute pericarditis - no tamponade on echo. Will interrogate her pacemaker. Would expect elevated troponin after 24 h of symptoms if this was a coronary event. 2. Possible pulmonary embolism - this could explain the pleuritic pain, hypotension and elevated D-dimer. V/Q pending 3. Hypotension - IV fluids, hold diuretics/BP meds 4. History of second degree AV block - interrogate PPM 5. Anemia - moderately severe, but similar to December. No overt bleeding, but monitor closely in view of low BP, possible occult hemorrhage 6. Obesity 7. DM, not on insulin 8. History of HTN - hold meds   Time Spent Directly with Patient: 428minutes  MSanda Klein  MD, [facility] [phone] [phone]   08/06/2015, 8:37 AM

## 2015-08-06 NOTE — ED Notes (Addendum)
Pt placed in Trendelenburg and manual BP taken to verify. Pt is alert and oriented. Complaining of drowsiness. Pt is speaking with RN. MD Broadus John paged at this time

## 2015-08-06 NOTE — ED Provider Notes (Signed)
8:38 AM Patient developed worsening chest pain and hypotension down into the 80s here in emergency department.  Her renal insufficiency limits our evaluation.  I had cardiology come to the bedside and we are in  the process of performing a bedside echocardiogram to evaluate for the possibility of LV dysfunction such as in ACS or RV dysfunction such as in PE.  We also took a look at her aortic arch to evaluate for dissection.  No obvious aortic root dilation is noted and no obvious dissection at this time although this is not the most optimal test for disection.  Pericarditis is the currently diagnosis by cardiology.  We will continue to follow along closely in the emergency department.  I have updated Dr. Broadus John, triad hospitalist, with the patient's new findings and vital signs.  Dr. Broadus John will continue to follow closely as well.  The patient is being admitted to stepdown unit and at this time will not change that admission.  I will update the chart with any new findings.  Jola Schmidt, MD 08/06/15 [phone]

## 2015-08-06 NOTE — ED Notes (Signed)
Pt arrived via EMS from home c/o chest pain, SOB.  Pt has demand pacemaker, EKG RBBB.  EMS gave 2 ntg without relief.

## 2015-08-06 NOTE — ED Notes (Signed)
Paged MD Broadus John

## 2015-08-06 NOTE — ED Notes (Addendum)
Lowered head of bed to 30 degrees for pt's BP. Due to pain, pt unable to tolerate more

## 2015-08-06 NOTE — Progress Notes (Signed)
ANTICOAGULATION CONSULT NOTE - Initial Consult  Pharmacy Consult for heparin Indication: PE  Allergies  Allergen Reactions  . Norvasc [Amlodipine Besylate] Shortness Of Breath  . Glucophage [Metformin Hydrochloride] Other (See Comments)    "increases creatinine"  . Lisinopril     Headache   . Losartan     Headache   . Metoprolol Other (See Comments)    Passed out  . Morphine And Related Other (See Comments)    "hallucinations"  . Simvastatin Other (See Comments)    Body aches  . Trulicity [Dulaglutide] Other (See Comments)    Severe mood swings    Patient Measurements: Height: _0  (149.9 cm) Weight: 249 lb 9 oz (113.2 kg) IBW/kg (Calculated) : 43.2 Heparin Dosing Weight: 75kg  Vital Signs: Temp: 98.1 F (36.7 C) (02/14 1108) Temp Source: Oral (02/14 1108) BP: 121/44 mmHg (02/14 1108) Pulse Rate: 84 (02/14 1108)  Labs:  Recent Labs  08/06/15 0146  HGB 7.8*  HCT 26.1*  PLT 276  CREATININE 1.86*    Estimated Creatinine Clearance: 29.8 mL/min (by C-G formula based on Cr of 1.86).   Medical History: Past Medical History  Diagnosis Date  . Diabetes mellitus   . Hypertension   . Obesity   . Sjogren's disease ([place])   . Hyperlipidemia   . Anemia   . Diverticulosis   . Osteoarthritis   . Intestinal obstruction ([place])   . Hiatal hernia   . Esophageal stricture   . Vitamin B12 deficiency    Assessment: 75yo female c/o CP and SOB, no relief w/ NTG given by EMS, D-dimer elevated, VQ scan shows no evidence of PE. Hgb low at 7.8, plts wnl. No s/s of bleed.   Goal of Therapy:  Heparin level 0.3-0.7 units/ml Monitor platelets by anticoagulation protocol: Yes   Plan:  Give 4,000 unit heparin BOLUS Start heparin gtt at 1,250 units/hr Check 8 hr HL Monitor daily HL, CBC, s/s of bleed  May not need to continue heparin as seems to be negative for PE  Elenor Quinones, PharmD, BCPS Clinical Pharmacist Pager [phone] 08/06/2015 1:06 PM

## 2015-08-06 NOTE — ED Notes (Signed)
MD Cardiology at the bedside

## 2015-08-06 NOTE — Progress Notes (Addendum)
[facility] NP L. [place]uk was paged for BP 76/31(45) @ [phone]

## 2015-08-06 NOTE — Progress Notes (Signed)
Pt seen and examined, admitted this am per Dr.K, 75/F with DM, CKD, s/p PPM for second degree AVB and syncope 34month ago now with Severe Pleuritic chest pain and mild Hypotension -s/p Urgent bedside ECHO per Cards, no signs of dissection/tamponade, normal EF, wall motion, mild Pulm HTN unchanged, RV ok. -VS scan pending to r/o PE, empiric Heparin till then -Troponin negative -if no PE, other possibility is pericarditis or micro tear from pacer leads -cards consulting  PDomenic Polite MD

## 2015-08-07 ENCOUNTER — Observation Stay (HOSPITAL_COMMUNITY): Payer: Medicare Other

## 2015-08-07 DIAGNOSIS — R071 Chest pain on breathing: Secondary | ICD-10-CM | POA: Diagnosis not present

## 2015-08-07 DIAGNOSIS — I10 Essential (primary) hypertension: Secondary | ICD-10-CM | POA: Diagnosis not present

## 2015-08-07 DIAGNOSIS — I309 Acute pericarditis, unspecified: Secondary | ICD-10-CM | POA: Diagnosis present

## 2015-08-07 DIAGNOSIS — N183 Chronic kidney disease, stage 3 (moderate): Secondary | ICD-10-CM | POA: Diagnosis not present

## 2015-08-07 DIAGNOSIS — D649 Anemia, unspecified: Secondary | ICD-10-CM | POA: Diagnosis not present

## 2015-08-07 DIAGNOSIS — R079 Chest pain, unspecified: Secondary | ICD-10-CM | POA: Diagnosis not present

## 2015-08-07 DIAGNOSIS — E875 Hyperkalemia: Secondary | ICD-10-CM | POA: Diagnosis not present

## 2015-08-07 LAB — BASIC METABOLIC PANEL
Anion gap: 10 (ref 5–15)
BUN: 66 mg/dL — ABNORMAL HIGH (ref 6–20)
CO2: 21 mmol/L — ABNORMAL LOW (ref 22–32)
Calcium: 7.3 mg/dL — ABNORMAL LOW (ref 8.9–10.3)
Chloride: 110 mmol/L (ref 101–111)
Creatinine, Ser: 2.23 mg/dL — ABNORMAL HIGH (ref 0.44–1.00)
GFR calc Af Amer: 24 mL/min — ABNORMAL LOW (ref >60)
GFR calc non Af Amer: 21 mL/min — ABNORMAL LOW (ref >60)
Glucose, Bld: 158 mg/dL — ABNORMAL HIGH (ref 65–99)
Potassium: 4.8 mmol/L (ref 3.5–5.1)
Sodium: 141 mmol/L (ref 135–145)

## 2015-08-07 LAB — CBC
HCT: 21.3 % — ABNORMAL LOW (ref 36.0–46.0)
Hemoglobin: 6.3 g/dL — CL (ref 12.0–15.0)
MCH: 26 pg (ref 26.0–34.0)
MCHC: 29.6 g/dL — ABNORMAL LOW (ref 30.0–36.0)
MCV: 88 fL (ref 78.0–100.0)
Platelets: 250 10*3/uL (ref 150–400)
RBC: 2.42 MIL/uL — ABNORMAL LOW (ref 3.87–5.11)
RDW: 16.4 % — ABNORMAL HIGH (ref 11.5–15.5)
WBC: 15.7 10*3/uL — ABNORMAL HIGH (ref 4.0–10.5)

## 2015-08-07 LAB — SEDIMENTATION RATE: Sed Rate: 64 mm/hr — ABNORMAL HIGH (ref 0–22)

## 2015-08-07 LAB — LACTIC ACID, PLASMA: Lactic Acid, Venous: 1 mmol/L (ref 0.5–2.0)

## 2015-08-07 LAB — TROPONIN I: Troponin I: 0.04 ng/mL — ABNORMAL HIGH (ref <0.031)

## 2015-08-07 LAB — GLUCOSE, CAPILLARY
Glucose-Capillary: 126 mg/dL — ABNORMAL HIGH (ref 65–99)
Glucose-Capillary: 145 mg/dL — ABNORMAL HIGH (ref 65–99)
Glucose-Capillary: 153 mg/dL — ABNORMAL HIGH (ref 65–99)
Glucose-Capillary: 170 mg/dL — ABNORMAL HIGH (ref 65–99)
Glucose-Capillary: 233 mg/dL — ABNORMAL HIGH (ref 65–99)

## 2015-08-07 LAB — PREPARE RBC (CROSSMATCH)

## 2015-08-07 MED ORDER — INSULIN ASPART 100 UNIT/ML ~~LOC~~ SOLN
0.0000 [IU] | Freq: Three times a day (TID) | SUBCUTANEOUS | Status: DC
  Administered 2015-08-08: 5 [IU] via SUBCUTANEOUS
  Administered 2015-08-08 (×2): 3 [IU] via SUBCUTANEOUS
  Administered 2015-08-09: 5 [IU] via SUBCUTANEOUS
  Administered 2015-08-09: 2 [IU] via SUBCUTANEOUS
  Administered 2015-08-09: 7 [IU] via SUBCUTANEOUS
  Administered 2015-08-10: 9 [IU] via SUBCUTANEOUS
  Administered 2015-08-10: 5 [IU] via SUBCUTANEOUS
  Administered 2015-08-10: 2 [IU] via SUBCUTANEOUS
  Administered 2015-08-11: 7 [IU] via SUBCUTANEOUS
  Administered 2015-08-11: 5 [IU] via SUBCUTANEOUS
  Administered 2015-08-11 – 2015-08-12 (×2): 2 [IU] via SUBCUTANEOUS
  Administered 2015-08-12 – 2015-08-13 (×2): 5 [IU] via SUBCUTANEOUS
  Administered 2015-08-13 (×2): 3 [IU] via SUBCUTANEOUS

## 2015-08-07 MED ORDER — SODIUM CHLORIDE 0.9 % IV SOLN
Freq: Once | INTRAVENOUS | Status: AC
  Administered 2015-08-07: 14:00:00 via INTRAVENOUS

## 2015-08-07 MED ORDER — PREDNISONE 20 MG PO TABS
20.0000 mg | ORAL_TABLET | Freq: Every day | ORAL | Status: AC
  Administered 2015-08-07 – 2015-08-11 (×5): 20 mg via ORAL
  Filled 2015-08-07 (×5): qty 1

## 2015-08-07 MED ORDER — FUROSEMIDE 10 MG/ML IJ SOLN
20.0000 mg | Freq: Once | INTRAMUSCULAR | Status: AC
  Administered 2015-08-07: 20 mg via INTRAVENOUS
  Filled 2015-08-07: qty 2

## 2015-08-07 MED ORDER — SODIUM CHLORIDE 0.9 % IV SOLN
Freq: Once | INTRAVENOUS | Status: AC
  Administered 2015-08-07: 07:00:00 via INTRAVENOUS

## 2015-08-07 MED ORDER — HYDROMORPHONE HCL 1 MG/ML IJ SOLN
0.5000 mg | INTRAMUSCULAR | Status: DC | PRN
  Administered 2015-08-07 – 2015-08-08 (×5): 0.5 mg via INTRAVENOUS
  Filled 2015-08-07 (×5): qty 1

## 2015-08-07 NOTE — Progress Notes (Signed)
[facility] PROGRESS NOTE   Tricia Gonzales XIH:[id number] DOB: 07-Jan-1941 DOA: 08/06/2015 PCP: Reginia Naas, MD  HPI/Subjective: Still complaining about moderate to severe chest pain. Increases with respiration.  Assessment/Plan: Principal Problem:   Chest pain Active Problems:   Pacemaker   Diabetes mellitus type 2, controlled (HCC)   Hyperlipidemia   Hypertension   CKD (chronic kidney disease) stage 3, GFR 30-59 ml/min   Chronic anemia   Pain in the chest   Chest pain Pleuritic, has positive d-dimer is, VQ scan is negative for PEs. Patient also has -3 sets of cardiac enzymes and 12-lead EKG. 2-D echo showed no evidence of pericardial effusion, normal LVEF. Per cardiology cannot rule out acute pericarditis.  Hypotension Unclear etiology, could be secondary to diuretics and opioid narcotics. Blood pressure is reasonable today, continue to follow closely.  History of second-degree AV block Permanent pacemaker in place, interrogated by cardiology.  Anemia Severe anemia with hemoglobin of 6.3, hemoglobin baseline is 8.2 from December 2016. This acute anemia on top of anemia of chronic disease secondary to security stage III, no evidence of bleeding. Will check CT scan without contrast, of the chest. Check FOBT patient is not on anticoagulation.  Acute renal failure and chronic kidney disease stage III. Patient has chronic disease with creatinine baseline of 1.8, presented with creatinine of 2.26. Continue IV fluids, this could be secondary to hypertension. Check BMP in a.m.    Code Status: Full Code Family Communication: Plan discussed with the patient. Disposition Plan: Remains inpatient Diet: Diet renal/carb modified with fluid restriction Diet-HS Snack?: Nothing; Room service appropriate?: Yes; Fluid consistency:: Thin  Consultants:  Cards  Procedures:  None  Antibiotics:  None   Objective: Filed Vitals:   08/07/15 1115 08/07/15 1200   BP: 134/55 143/68  Pulse: 81 83  Temp: 98.1 F (36.7 C) 98.5 F (36.9 C)  Resp: 18 20    Intake/Output Summary (Last 24 hours) at 08/07/15 1321 Last data filed at 08/07/15 0700  Gross per 24 hour  Intake 2832.5 ml  Output    550 ml  Net 2282.5 ml   Filed Weights   08/06/15 0500  Weight: 113.2 kg (249 lb 9 oz)    Exam: General: Alert and awake, oriented x3, not in any acute distress. HEENT: anicteric sclera, pupils reactive to light and accommodation, EOMI CVS: S1-S2 clear, no murmur rubs or gallops Chest: clear to auscultation bilaterally, no wheezing, rales or rhonchi Abdomen: soft nontender, nondistended, normal bowel sounds, no organomegaly Extremities: no cyanosis, clubbing or edema noted bilaterally Neuro: Cranial nerves II-XII intact, no focal neurological deficits  Data Reviewed: Basic Metabolic Panel:  Recent Labs Lab 08/06/15 0146 08/06/15 1321 08/06/15 2104 08/07/15 1058  NA 138 140 136 141  K 5.6* 5.1 5.2* 4.8  CL 104 108 104 110  CO2 26 22 20* 21*  GLUCOSE 265* 250* 271* 158*  BUN 70* 69* 71* 66*  CREATININE 1.86* 1.87* 2.28* 2.23*  CALCIUM 8.8* 8.1* 7.8* 7.3*   Liver Function Tests:  Recent Labs Lab 08/06/15 0146  AST 16  ALT 12*  ALKPHOS 57  BILITOT 0.6  PROT 6.2*  ALBUMIN 3.3*    Recent Labs Lab 08/06/15 0146  LIPASE 25   No results for input(s): AMMONIA in the last 168 hours. CBC:  Recent Labs Lab 08/06/15 0146 08/06/15 1321 08/06/15 2104 08/07/15 0500  WBC 11.1* 21.1* 21.4* 15.7*  NEUTROABS 9.7* 19.5* 19.3*  --   HGB 7.8* 7.3* 7.1* 6.3*  HCT 26.1*  25.0* 23.9* 21.3*  MCV 86.4 87.1 87.5 88.0  PLT 276 282 278 250   Cardiac Enzymes:  Recent Labs Lab 08/06/15 1321 08/06/15 1647 08/06/15 2356  TROPONINI <0.03 0.04* 0.04*   BNP (last 3 results)  Recent Labs  08/06/15 0146  BNP 233.6*    ProBNP (last 3 results) No results for input(s): PROBNP in the last 8760 hours.  CBG:  Recent Labs Lab 08/06/15 2031  08/06/15 2334 08/07/15 0429 08/07/15 0826 08/07/15 1213  GLUCAP 250* 207* 126* 145* 153*    Micro Recent Results (from the past 240 hour(s))  MRSA PCR Screening     Status: None   Collection Time: 08/06/15 11:24 AM  Result Value Ref Range Status   MRSA by PCR NEGATIVE NEGATIVE Final    Comment:        The GeneXpert MRSA Assay (FDA approved for NASAL specimens only), is one component of a comprehensive MRSA colonization surveillance program. It is not intended to diagnose MRSA infection nor to guide or monitor treatment for MRSA infections.   Culture, blood (routine x 2)     Status: None (Preliminary result)   Collection Time: 08/06/15  8:46 PM  Result Value Ref Range Status   Specimen Description BLOOD LEFT HAND  Final   Special Requests   Final    BOTTLES DRAWN AEROBIC AND ANAEROBIC 4.5CC BLUE 5CC RED   Culture NO GROWTH < 24 HOURS  Final   Report Status PENDING  Incomplete     Studies: Nm Pulmonary Perf And Vent  08/06/2015  CLINICAL DATA:  New onset chest pain with shortness of Breath EXAM: NUCLEAR MEDICINE VENTILATION - PERFUSION LUNG SCAN TECHNIQUE: Ventilation images were obtained in multiple projections using inhaled aerosol [id number]. Perfusion images were obtained in multiple projections after intravenous injection of Tc-938mAA. RADIOPHARMACEUTICALS:  3148.5illicuries [id number] aerosol inhalation and 4.3 millicuries [id number] IV COMPARISON:  Chest x-ray from earlier in the same day FINDINGS: Ventilation: Mild clumping is noted centrally. No significant ventilation defect is identified. Perfusion: Adequate perfusion is noted. A defect is noted related to the pacemaker pack anteriorly on the left. No perfusion defect to suggest pulmonary embolism is seen. IMPRESSION: No evidence of pulmonary embolism. Electronically Signed   By: MaInez Catalina.D.   On: 08/06/2015 12:20   Dg Chest Portable 1 View  08/06/2015  CLINICAL DATA:  Shortness of breath.  Chest pain on inspiration for 1 day. EXAM: PORTABLE CHEST 1 VIEW COMPARISON:  06/19/2015 FINDINGS: Cardiac pacemaker. Shallow inspiration. Mild cardiac enlargement without significant vascular congestion. No focal airspace disease or consolidation in the lungs. No blunting of costophrenic angles. No pneumothorax. Calcified and tortuous aorta. IMPRESSION: Mild cardiac enlargement.  No evidence of active pulmonary disease. Electronically Signed   By: WiLucienne Capers.D.   On: 08/06/2015 01:55    Scheduled Meds: . aspirin EC  325 mg Oral Daily  . docusate sodium  100 mg Oral QODAY  . febuxostat  40 mg Oral Daily  . gabapentin  300 mg Oral BID  . hydroxychloroquine  400 mg Oral Daily  . insulin aspart  0-9 Units Subcutaneous Q4H  . linagliptin  5 mg Oral Daily  . montelukast  10 mg Oral QHS  . pantoprazole  40 mg Oral Daily  . pentoxifylline  400 mg Oral Daily  . pravastatin  40 mg Oral Daily   Continuous Infusions: . sodium chloride 150 mL/hr at 08/07/15 [id number]     Time spent:  35 minutes    [facility]  [facility] Pager [phone] If 7PM-7AM, please contact night-coverage at [email or web address], password [facility] 08/07/2015, 1:21 PM

## 2015-08-07 NOTE — Progress Notes (Signed)
Was paged by nurse just after 8pm that BP dropping into 28V systolic. Pt feeling weak, however mental status remains normal and no dizziness. HR in 70s, other VS stable. Chart reviewed, pt suspected of possible pericarditis causing leukocytosis and chest pain. Pt was hypotensive previously and received a total of 2L NS, is currently saline locked. Last dose of fentanyl was around 5pm for chest pain.   Labs drawn - WBC trending up now 21.4, Hgb low but stable (baseline). Lactate normal. Creatinine worsening. Urine output has been low. Urine with epithelial cells, will need to re-collect.  She was given and additional 2L NS, bp dropped further after receiving fentanyl but BP steadily climbing. I spoke with Dr. Hal Hope and Dr. Lake Bells with CCM, will re-evaluate after bolus complete but with normal lactate and no tachycardia does not meet sepsis criteria, will hold off on antibiotics. Unclear etiology of hypotension but no tampanode, no definite infectious source. Will continue to monitor closely tonight and if BP drops again will re-consult CCM for further management. Cont IVF and d/c fentanyl. Pt evaluated at bedside by Dr. Hal Hope. Above plan discussed with him.

## 2015-08-07 NOTE — Progress Notes (Addendum)
Patient Profile: 75 y.o. 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 She presented back [facility] on 08/06/15 with complaint of pleuritic CP. Troponin with flat low level trend of 0.04, D-dimer abnormal however V/Q negative for PE. ESR elevated at 64.   Subjective: Still feels weak. Still with pleuritic CP and mild dyspnea.   Objective: Vital signs in last 24 hours: Temp:  [97.8 F (36.6 C)-99.8 F (37.7 C)] 98.5 F (36.9 C) (02/15 1200) Pulse Rate:  [62-83] 83 (02/15 1200) Resp:  [12-23] 20 (02/15 1200) BP: (54-146)/(23-93) 143/68 mmHg (02/15 1200) SpO2:  [95 %-100 %] 96 % (02/15 1200) Last BM Date: 08/06/15  Intake/Output from previous day: 02/14 0701 - 02/15 0700 In: 2832.5 [I.V.:2832.5] Out: 550 [Urine:550] Intake/Output this shift:    Medications Current Facility-Administered Medications  Medication Dose Route Frequency Provider Last Rate Last Dose  . 0.9 %  sodium chloride infusion   Intravenous Continuous Hewitt Shorts [place], PA-C 150 mL/hr at 08/07/15 0867    . acetaminophen (TYLENOL) tablet 650 mg  650 mg Oral Q4H PRN Rise Patience, MD      . aspirin EC tablet 325 mg  325 mg Oral Daily Rise Patience, MD   325 mg at 08/07/15 0913  . docusate sodium (COLACE) capsule 100 mg  100 mg Oral QODAY Rise Patience, MD   100 mg at 08/06/15 1238  . febuxostat (ULORIC) tablet 40 mg  40 mg Oral Daily Rise Patience, MD   40 mg at 08/07/15 0913  . gabapentin (NEURONTIN) capsule 300 mg  300 mg Oral BID Rise Patience, MD   300 mg at 08/07/15 0913  . hydrALAZINE (APRESOLINE) injection 10 mg  10 mg Intravenous Q4H PRN Rise Patience, MD      . HYDROmorphone (DILAUDID) injection 0.5 mg  0.5 mg Intravenous Q3H PRN Verlee Monte, MD   0.5 mg at 08/07/15 0942  . hydroxychloroquine  (PLAQUENIL) tablet 400 mg  400 mg Oral Daily Rise Patience, MD   400 mg at 08/07/15 0914  . insulin aspart (novoLOG) injection 0-9 Units  0-9 Units Subcutaneous Q4H Rise Patience, MD   1 Units at 08/07/15 0912  . linagliptin (TRADJENTA) tablet 5 mg  5 mg Oral Daily Rise Patience, MD   5 mg at 08/07/15 0913  . montelukast (SINGULAIR) tablet 10 mg  10 mg Oral QHS Rise Patience, MD   10 mg at 08/06/15 2126  . ondansetron (ZOFRAN) injection 4 mg  4 mg Intravenous Q6H PRN Rise Patience, MD      . pantoprazole (PROTONIX) EC tablet 40 mg  40 mg Oral Daily Rise Patience, MD   40 mg at 08/07/15 0914  . pentoxifylline (TRENTAL) CR tablet 400 mg  400 mg Oral Daily Rise Patience, MD   400 mg at 08/07/15 0914  . pravastatin (PRAVACHOL) tablet 40 mg  40 mg Oral Daily Rise Patience, MD   40 mg at 08/07/15 0914  . technetium TC 49M diethylenetriame-pentaacetic acid (DTPA) injection 31.8 milli Curie  31.8 milli Curie Intravenous Once PRN Merryl Hacker, MD        PE: General appearance: alert, cooperative and no distress Lungs: clear to auscultation bilaterally Heart: regular rate and rhythm, S1, S2 normal, no murmur, click, rub or gallop Extremities:  no LEE Pulses: 2+ and symmetric Skin: Skin color, texture, turgor normal. No rashes or lesions Neurologic: Grossly normal  Lab Results:   Recent Labs  08/06/15 1321 08/06/15 2104 08/07/15 0500  WBC 21.1* 21.4* 15.7*  HGB 7.3* 7.1* 6.3*  HCT 25.0* 23.9* 21.3*  PLT 282 278 250   BMET  Recent Labs  08/06/15 1321 08/06/15 2104 08/07/15 1058  NA 140 136 141  K 5.1 5.2* 4.8  CL 108 104 110  CO2 22 20* 21*  GLUCOSE 250* 271* 158*  BUN 69* 71* 66*  CREATININE 1.87* 2.28* 2.23*  CALCIUM 8.1* 7.8* 7.3*   Cardiac Panel (last 3 results)  Recent Labs  08/06/15 1321 08/06/15 1647 08/06/15 2356  TROPONINI <0.03 0.04* 0.04*     Studies/Results: 2D echo    Assessment/Plan  Principal  Problem:   Chest pain Active Problems:   Pacemaker   Diabetes mellitus type 2, controlled (HCC)   Hyperlipidemia   Hypertension   CKD (chronic kidney disease) stage 3, GFR 30-59 ml/min   Chronic anemia   Pain in the chest   1. Chest Pain: cardiac enzymes slightly elevated but with flat low level trend not consistent with ACS. 2D echo shows normal EF and wall motion. No pericardial effusion. D-dimer is abnormal however V/Q scan was negative for PE. ESR is elevated at 64. Suspect possible pericarditis given pleuritic chest pain and abnormal inflammatory markers. Usual therapy would be treatment with NSAIDs, however unable to initiate given baseline renal function with SCr up to 2.23. Would also avoid use of colchicine as well given renal function. Can treat with high dose ASA vs prednisone.   2. H/o second degree AV block: s/p PPM insertion 05/2015.  Will interrogate device.  3. HTN: currently not on any BP meds given recent issues with hypotension. Was on lasix and Avapro as an outpatient. Current BP is in the 140s. Will need to avoid further use of both agents given her renal function. Can consider low dose amlodipine if needed.   4. Anemia: H/H continues to drop. Hgb today is 6.3. She is receiving blood. IM managing.       08/07/2015 1:14 PM  I have seen and examined the patient along with Brittainy M. Simmons, PA-C.  I have reviewed the chart, notes and new data.  I agree with PA's note.  Key new complaints: pleuritic pain has improved Key examination changes: no rub, pale Key new findings / data: Hgb continues to drop. No evidence of intrathoracic bleeding by chest CT, but some small pleural effusions. No overt GI bleeding so far. VQ scan negative. ESR high (less significant with her degree of anemia).   PLAN: 1. Acute pericarditis/pleuritis, etiology could be: - viral - pacemaker lead (micro-)perforation - autoimmune (she has Sjogren's sd.).  No large effusion on echo or CT.  Symptomatic therapy recommended. Unfortunately NSAIDs and colchicine are not good choices for her. Will start short course of prednisone. She has taken it before with only mild worsening of glycemic control  Mihai Croitoru, MD, FACC [facility] [phone] 08/07/2015, 6:00 PM  

## 2015-08-07 NOTE — Care Management Obs Status (Signed)
[place] NOTIFICATION   Patient Details  Name: Tricia Gonzales MRN: [id number] Date of Birth: 10-01-1940   Medicare Observation Status Notification Given:  Yes    MayoKym Groom, RN 08/07/2015, 12:36 PM

## 2015-08-07 NOTE — Progress Notes (Signed)
CRITICAL VALUE ALERT  Critical value received:  Eulis Foster  Date of notification: 08/07/2015  Time of notification:  0555  Critical value read back: Yes  Nurse who received alert:  Eulis Foster  MD notified (1st page):  Roger Shelter NP  Time of first page: 0559   MD notified (2nd page):  Time of second page: 0629  Responding MD:  Roger Shelter NP  Time MD responded: [phone]

## 2015-08-08 DIAGNOSIS — Z9049 Acquired absence of other specified parts of digestive tract: Secondary | ICD-10-CM | POA: Diagnosis not present

## 2015-08-08 DIAGNOSIS — N184 Chronic kidney disease, stage 4 (severe): Secondary | ICD-10-CM | POA: Diagnosis present

## 2015-08-08 DIAGNOSIS — R079 Chest pain, unspecified: Secondary | ICD-10-CM | POA: Diagnosis present

## 2015-08-08 DIAGNOSIS — J189 Pneumonia, unspecified organism: Secondary | ICD-10-CM | POA: Diagnosis present

## 2015-08-08 DIAGNOSIS — I4510 Unspecified right bundle-branch block: Secondary | ICD-10-CM | POA: Diagnosis present

## 2015-08-08 DIAGNOSIS — N179 Acute kidney failure, unspecified: Secondary | ICD-10-CM | POA: Diagnosis present

## 2015-08-08 DIAGNOSIS — E1165 Type 2 diabetes mellitus with hyperglycemia: Secondary | ICD-10-CM | POA: Diagnosis present

## 2015-08-08 DIAGNOSIS — K449 Diaphragmatic hernia without obstruction or gangrene: Secondary | ICD-10-CM | POA: Diagnosis present

## 2015-08-08 DIAGNOSIS — K222 Esophageal obstruction: Secondary | ICD-10-CM | POA: Diagnosis present

## 2015-08-08 DIAGNOSIS — E875 Hyperkalemia: Secondary | ICD-10-CM | POA: Diagnosis present

## 2015-08-08 DIAGNOSIS — E1122 Type 2 diabetes mellitus with diabetic chronic kidney disease: Secondary | ICD-10-CM | POA: Diagnosis present

## 2015-08-08 DIAGNOSIS — E785 Hyperlipidemia, unspecified: Secondary | ICD-10-CM

## 2015-08-08 DIAGNOSIS — Z888 Allergy status to other drugs, medicaments and biological substances status: Secondary | ICD-10-CM | POA: Diagnosis not present

## 2015-08-08 DIAGNOSIS — N183 Chronic kidney disease, stage 3 (moderate): Secondary | ICD-10-CM | POA: Diagnosis not present

## 2015-08-08 DIAGNOSIS — K2960 Other gastritis without bleeding: Secondary | ICD-10-CM | POA: Diagnosis present

## 2015-08-08 DIAGNOSIS — R195 Other fecal abnormalities: Secondary | ICD-10-CM | POA: Diagnosis present

## 2015-08-08 DIAGNOSIS — D649 Anemia, unspecified: Secondary | ICD-10-CM | POA: Diagnosis not present

## 2015-08-08 DIAGNOSIS — E7800 Pure hypercholesterolemia, unspecified: Secondary | ICD-10-CM | POA: Diagnosis present

## 2015-08-08 DIAGNOSIS — D631 Anemia in chronic kidney disease: Secondary | ICD-10-CM | POA: Diagnosis present

## 2015-08-08 DIAGNOSIS — Z950 Presence of cardiac pacemaker: Secondary | ICD-10-CM | POA: Diagnosis not present

## 2015-08-08 DIAGNOSIS — Z885 Allergy status to narcotic agent status: Secondary | ICD-10-CM | POA: Diagnosis not present

## 2015-08-08 DIAGNOSIS — Z6841 Body Mass Index (BMI) 40.0 and over, adult: Secondary | ICD-10-CM | POA: Diagnosis not present

## 2015-08-08 DIAGNOSIS — Z7984 Long term (current) use of oral hypoglycemic drugs: Secondary | ICD-10-CM | POA: Diagnosis not present

## 2015-08-08 DIAGNOSIS — I959 Hypotension, unspecified: Secondary | ICD-10-CM | POA: Diagnosis present

## 2015-08-08 DIAGNOSIS — R0601 Orthopnea: Secondary | ICD-10-CM | POA: Diagnosis present

## 2015-08-08 DIAGNOSIS — Z833 Family history of diabetes mellitus: Secondary | ICD-10-CM | POA: Diagnosis not present

## 2015-08-08 DIAGNOSIS — K5790 Diverticulosis of intestine, part unspecified, without perforation or abscess without bleeding: Secondary | ICD-10-CM | POA: Diagnosis present

## 2015-08-08 DIAGNOSIS — B9620 Unspecified Escherichia coli [E. coli] as the cause of diseases classified elsewhere: Secondary | ICD-10-CM | POA: Diagnosis present

## 2015-08-08 DIAGNOSIS — R071 Chest pain on breathing: Secondary | ICD-10-CM | POA: Diagnosis not present

## 2015-08-08 DIAGNOSIS — M7989 Other specified soft tissue disorders: Secondary | ICD-10-CM | POA: Diagnosis not present

## 2015-08-08 DIAGNOSIS — Z96659 Presence of unspecified artificial knee joint: Secondary | ICD-10-CM | POA: Diagnosis present

## 2015-08-08 DIAGNOSIS — E8770 Fluid overload, unspecified: Secondary | ICD-10-CM | POA: Diagnosis not present

## 2015-08-08 DIAGNOSIS — K3189 Other diseases of stomach and duodenum: Secondary | ICD-10-CM | POA: Diagnosis not present

## 2015-08-08 DIAGNOSIS — R001 Bradycardia, unspecified: Secondary | ICD-10-CM | POA: Diagnosis present

## 2015-08-08 DIAGNOSIS — Z79899 Other long term (current) drug therapy: Secondary | ICD-10-CM | POA: Diagnosis not present

## 2015-08-08 DIAGNOSIS — Z87891 Personal history of nicotine dependence: Secondary | ICD-10-CM | POA: Diagnosis not present

## 2015-08-08 DIAGNOSIS — N390 Urinary tract infection, site not specified: Secondary | ICD-10-CM | POA: Diagnosis present

## 2015-08-08 DIAGNOSIS — K219 Gastro-esophageal reflux disease without esophagitis: Secondary | ICD-10-CM | POA: Diagnosis present

## 2015-08-08 DIAGNOSIS — Z8249 Family history of ischemic heart disease and other diseases of the circulatory system: Secondary | ICD-10-CM | POA: Diagnosis not present

## 2015-08-08 DIAGNOSIS — I309 Acute pericarditis, unspecified: Secondary | ICD-10-CM | POA: Diagnosis present

## 2015-08-08 DIAGNOSIS — I1310 Hypertensive heart and chronic kidney disease without heart failure, with stage 1 through stage 4 chronic kidney disease, or unspecified chronic kidney disease: Secondary | ICD-10-CM | POA: Diagnosis present

## 2015-08-08 DIAGNOSIS — M3500 Sicca syndrome, unspecified: Secondary | ICD-10-CM | POA: Diagnosis present

## 2015-08-08 DIAGNOSIS — Z7982 Long term (current) use of aspirin: Secondary | ICD-10-CM | POA: Diagnosis not present

## 2015-08-08 LAB — GLUCOSE, CAPILLARY
Glucose-Capillary: 237 mg/dL — ABNORMAL HIGH (ref 65–99)
Glucose-Capillary: 266 mg/dL — ABNORMAL HIGH (ref 65–99)
Glucose-Capillary: 230 mg/dL — ABNORMAL HIGH (ref 65–99)
Glucose-Capillary: 223 mg/dL — ABNORMAL HIGH (ref 65–99)

## 2015-08-08 LAB — BASIC METABOLIC PANEL
Anion gap: 10 (ref 5–15)
BUN: 69 mg/dL — ABNORMAL HIGH (ref 6–20)
CO2: 21 mmol/L — ABNORMAL LOW (ref 22–32)
Calcium: 7.4 mg/dL — ABNORMAL LOW (ref 8.9–10.3)
Chloride: 107 mmol/L (ref 101–111)
Creatinine, Ser: 2.08 mg/dL — ABNORMAL HIGH (ref 0.44–1.00)
GFR calc Af Amer: 26 mL/min — ABNORMAL LOW (ref >60)
GFR calc non Af Amer: 22 mL/min — ABNORMAL LOW (ref >60)
Glucose, Bld: 279 mg/dL — ABNORMAL HIGH (ref 65–99)
Potassium: 4.8 mmol/L (ref 3.5–5.1)
Sodium: 138 mmol/L (ref 135–145)

## 2015-08-08 LAB — CBC
HCT: 27.3 % — ABNORMAL LOW (ref 36.0–46.0)
HCT: 28.4 % — ABNORMAL LOW (ref 36.0–46.0)
Hemoglobin: 8.8 g/dL — ABNORMAL LOW (ref 12.0–15.0)
Hemoglobin: 8.6 g/dL — ABNORMAL LOW (ref 12.0–15.0)
MCH: 28.2 pg (ref 26.0–34.0)
MCH: 26.5 pg (ref 26.0–34.0)
MCHC: 32.2 g/dL (ref 30.0–36.0)
MCHC: 30.3 g/dL (ref 30.0–36.0)
MCV: 87.5 fL (ref 78.0–100.0)
MCV: 87.4 fL (ref 78.0–100.0)
Platelets: 245 10*3/uL (ref 150–400)
Platelets: 262 10*3/uL (ref 150–400)
RBC: 3.12 MIL/uL — ABNORMAL LOW (ref 3.87–5.11)
RBC: 3.25 MIL/uL — ABNORMAL LOW (ref 3.87–5.11)
RDW: 15.9 % — ABNORMAL HIGH (ref 11.5–15.5)
RDW: 16.1 % — ABNORMAL HIGH (ref 11.5–15.5)
WBC: 16.7 10*3/uL — ABNORMAL HIGH (ref 4.0–10.5)
WBC: 14.5 10*3/uL — ABNORMAL HIGH (ref 4.0–10.5)

## 2015-08-08 LAB — B. BURGDORFI ANTIBODIES: B burgdorferi Ab IgG+IgM: <0.91 {ISR} (ref 0.00–0.90)

## 2015-08-08 LAB — PROCALCITONIN: Procalcitonin: 1.12 ng/mL

## 2015-08-08 MED ORDER — HYDROCODONE-ACETAMINOPHEN 5-325 MG PO TABS
1.0000 | ORAL_TABLET | Freq: Four times a day (QID) | ORAL | Status: DC | PRN
  Administered 2015-08-08 – 2015-08-11 (×2): 1 via ORAL
  Filled 2015-08-08 (×2): qty 1

## 2015-08-08 MED ORDER — DEXTROSE 5 % IV SOLN
500.0000 mg | INTRAVENOUS | Status: DC
  Administered 2015-08-08 – 2015-08-12 (×5): 500 mg via INTRAVENOUS
  Filled 2015-08-08 (×6): qty 500

## 2015-08-08 MED ORDER — GUAIFENESIN ER 600 MG PO TB12
1200.0000 mg | ORAL_TABLET | Freq: Two times a day (BID) | ORAL | Status: DC
  Administered 2015-08-08 – 2015-08-13 (×11): 1200 mg via ORAL
  Filled 2015-08-08 (×11): qty 2

## 2015-08-08 MED ORDER — INSULIN ASPART 100 UNIT/ML ~~LOC~~ SOLN
0.0000 [IU] | Freq: Every day | SUBCUTANEOUS | Status: DC
Start: 2015-08-08 — End: 2015-08-13
  Administered 2015-08-08 – 2015-08-10 (×2): 2 [IU] via SUBCUTANEOUS

## 2015-08-08 MED ORDER — DEXTROSE 5 % IV SOLN
1.0000 g | INTRAVENOUS | Status: DC
  Administered 2015-08-08 – 2015-08-12 (×5): 1 g via INTRAVENOUS
  Filled 2015-08-08 (×6): qty 10

## 2015-08-08 MED ORDER — GUAIFENESIN-DM 100-10 MG/5ML PO SYRP: 5.0000 mL | ORAL_SOLUTION | ORAL | Status: DC | PRN

## 2015-08-08 MED ORDER — ALUM & MAG HYDROXIDE-SIMETH 200-200-20 MG/5ML PO SUSP
15.0000 mL | Freq: Four times a day (QID) | ORAL | Status: DC | PRN
  Administered 2015-08-08: 15 mL via ORAL
  Filled 2015-08-08: qty 30

## 2015-08-08 MED ORDER — INSULIN ASPART 100 UNIT/ML ~~LOC~~ SOLN: 0.0000 [IU] | Freq: Three times a day (TID) | SUBCUTANEOUS | Status: DC

## 2015-08-08 NOTE — Progress Notes (Addendum)
Patient is being transferred to 2W room 15. Report given to the accepting nurse. I tried to call Nira Conn, patient's daughter, to inform her of this transfer.  Message left.  Receiving nurse stated that she will call me once the room is ready.  Direct phone number given.

## 2015-08-08 NOTE — Progress Notes (Signed)
Inpatient Diabetes Program Recommendations  AACE/ADA: New Consensus Statement on Inpatient Glycemic Control (2015)  Target Ranges:  Prepandial:   less than 140 mg/dL      Peak postprandial:   less than 180 mg/dL (1-2 hours)      Critically ill patients:  140 - 180 mg/dL   Review of Glycemic Control  Inpatient Diabetes Program Recommendations:  Correction (SSI): Increase Novolog to moderate scale  Thank you  Raoul Pitch BSN, RN,CDE Inpatient Diabetes Coordinator [phone] (team pager)

## 2015-08-08 NOTE — Progress Notes (Signed)
Notified physician of CBG at 9 pm   223.    No order for  HS  Sliding insulin coverage.  Mervyn Skeeters, RN

## 2015-08-08 NOTE — Progress Notes (Signed)
Received patient in room 2W15 from Unit 2C, alert & oriented, vital signs   97.8 temp Hr 82 Resp 18   O2sat  93% on room air, patient up to bathroom  Voids qs.  Ambulated well with one assist.  Tavaria Mackins, rn

## 2015-08-08 NOTE — Progress Notes (Signed)
[facility] PROGRESS NOTE   ZIPPORAH FINAMORE ZJQ:[id number] DOB: [birth year] DOA: 08/06/2015 PCP: Reginia Naas, MD  HPI/Subjective: Still complaining about moderate to severe chest pain. Increases with respiration.  Assessment/Plan: Principal Problem:   Chest pain Active Problems:   Pacemaker   Diabetes mellitus type 2, controlled (HCC)   Hyperlipidemia   Hypertension   CKD (chronic kidney disease) stage 3, GFR 30-59 ml/min   Chronic anemia   Pain in the chest   Acute pericarditis   Chest pain Pleuritic, has positive d-dimer is, VQ scan is negative for PEs. Patient also has -3 sets of cardiac enzymes and 12-lead EKG. 2-D echo showed no evidence of pericardial effusion, normal LVEF. Per cardiology cannot rule out acute pericarditis.  Community acquired pneumonia, presumed Patient came in with chest pain and fever, not much of SOB and sputum production. CT scan of the chest showed atelectasis versus infiltrates. Because of patient fever, leukocytosis and increased procalcitonin, I well start her on antibiotics for pneumonia. Supportive management with mucolytics, bronchial dilators, antitussives and oxygen as needed.  Hypotension Unclear etiology, could be secondary to diuretics and opioid narcotics. Blood pressure is reasonable today, continue to follow closely.  History of second-degree AV block Permanent pacemaker in place, interrogated by cardiology.  Anemia Severe anemia with hemoglobin of 6.3, hemoglobin baseline is 8.2 from December 2016. This acute anemia on top of anemia of chronic disease secondary to security stage III, no evidence of bleeding. Will check CT scan without contrast, of the chest. Check FOBT patient is not on anticoagulation.  Acute renal failure and chronic kidney disease stage III. Patient has chronic disease with creatinine baseline of 1.8, presented with creatinine of 2.26. Continue IV fluids, this could be secondary to  hypertension. Check BMP in a.m.    Code Status: Full Code Family Communication: Plan discussed with the patient. Disposition Plan: Remains inpatient Diet: Diet renal/carb modified with fluid restriction Diet-HS Snack?: Nothing; Room service appropriate?: Yes; Fluid consistency:: Thin  Consultants:  Cards  Procedures:  None  Antibiotics:  None   Objective: Filed Vitals:   08/08/15 0700 08/08/15 0800  BP: 139/52 141/66  Pulse: 72 82  Temp:  98.3 F (36.8 C)  Resp: 14 16    Intake/Output Summary (Last 24 hours) at 08/08/15 1240 Last data filed at 08/08/15 0930  Gross per 24 hour  Intake    825 ml  Output   1425 ml  Net   -600 ml   Filed Weights   08/06/15 0500  Weight: 113.2 kg (249 lb 9 oz)    Exam: General: Alert and awake, oriented x3, not in any acute distress. HEENT: anicteric sclera, pupils reactive to light and accommodation, EOMI CVS: S1-S2 clear, no murmur rubs or gallops Chest: clear to auscultation bilaterally, no wheezing, rales or rhonchi Abdomen: soft nontender, nondistended, normal bowel sounds, no organomegaly Extremities: no cyanosis, clubbing or edema noted bilaterally Neuro: Cranial nerves II-XII intact, no focal neurological deficits  Data Reviewed: Basic Metabolic Panel:  Recent Labs Lab 08/06/15 0146 08/06/15 1321 08/06/15 2104 08/07/15 1058 08/08/15 0528  NA 138 140 136 141 138  K 5.6* 5.1 5.2* 4.8 4.8  CL 104 108 104 110 107  CO2 26 22 20* 21* 21*  GLUCOSE 265* 250* 271* 158* 279*  BUN 70* 69* 71* 66* 69*  CREATININE 1.86* 1.87* 2.28* 2.23* 2.08*  CALCIUM 8.8* 8.1* 7.8* 7.3* 7.4*   Liver Function Tests:  Recent Labs Lab 08/06/15 0146  AST 16  ALT 12*  ALKPHOS 57  BILITOT 0.6  PROT 6.2*  ALBUMIN 3.3*    Recent Labs Lab 08/06/15 0146  LIPASE 25   No results for input(s): AMMONIA in the last 168 hours. CBC:  Recent Labs Lab 08/06/15 0146 08/06/15 1321 08/06/15 2104 08/07/15 0500 08/07/15 1701  08/08/15 0528  WBC 11.1* 21.1* 21.4* 15.7* 16.7* 14.5*  NEUTROABS 9.7* 19.5* 19.3*  --   --   --   HGB 7.8* 7.3* 7.1* 6.3* 8.8* 8.6*  HCT 26.1* 25.0* 23.9* 21.3* 27.3* 28.4*  MCV 86.4 87.1 87.5 88.0 87.5 87.4  PLT 276 282 278 250 245 262   Cardiac Enzymes:  Recent Labs Lab 08/06/15 1321 08/06/15 1647 08/06/15 2356  TROPONINI <0.03 0.04* 0.04*   BNP (last 3 results)  Recent Labs  08/06/15 0146  BNP 233.6*    ProBNP (last 3 results) No results for input(s): PROBNP in the last 8760 hours.  CBG:  Recent Labs Lab 08/07/15 0826 08/07/15 1213 08/07/15 1701 08/07/15 2103 08/08/15 0808  GLUCAP 145* 153* 170* 233* 237*    Micro Recent Results (from the past 240 hour(s))  MRSA PCR Screening     Status: None   Collection Time: 08/06/15 11:24 AM  Result Value Ref Range Status   MRSA by PCR NEGATIVE NEGATIVE Final    Comment:        The GeneXpert MRSA Assay (FDA approved for NASAL specimens only), is one component of a comprehensive MRSA colonization surveillance program. It is not intended to diagnose MRSA infection nor to guide or monitor treatment for MRSA infections.   Culture, blood (routine x 2)     Status: None (Preliminary result)   Collection Time: 08/06/15  8:46 PM  Result Value Ref Range Status   Specimen Description BLOOD LEFT HAND  Final   Special Requests   Final    BOTTLES DRAWN AEROBIC AND ANAEROBIC 4.5CC BLUE 5CC RED   Culture NO GROWTH 2 DAYS  Final   Report Status PENDING  Incomplete  Culture, blood (routine x 2)     Status: None (Preliminary result)   Collection Time: 08/06/15 11:56 PM  Result Value Ref Range Status   Specimen Description BLOOD LEFT HAND  Final   Special Requests IN PEDIATRIC BOTTLE 1CC  Final   Culture NO GROWTH 1 DAY  Final   Report Status PENDING  Incomplete     Studies: Ct Chest Wo Contrast  08/07/2015  CLINICAL DATA:  Moderate to severe chest pain EXAM: CT CHEST WITHOUT CONTRAST TECHNIQUE: Multidetector CT  imaging of the chest was performed following the standard protocol without IV contrast. COMPARISON:  Chest x-ray 08/06/2015.  Chest CT 08/17/2014 FINDINGS: Mediastinum / Lymph Nodes: There is no axillary lymphadenopathy. 11 mm right thyroid nodule noted. No mediastinal lymphadenopathy. There is no hilar lymphadenopathy. Heart is enlarged. Trace pericardial fluid/thickening noted. The esophagus has normal imaging features. Left dual lead permanent pacemaker again noted. Lungs / Pleura: Bilateral lower lobe collapse/consolidation, right greater than left. There is some trace probable atelectasis in the posterior left upper lobe. Small bilateral pleural effusions, right greater than left. Upper Abdomen:  Unremarkable. MSK / Soft Tissues: Bone windows reveal no worrisome lytic or sclerotic osseous lesions. IMPRESSION: Low volume film with bilateral lower lobe collapse/ consolidation (right greater than left) and small bilateral pleural effusions. Electronically Signed   By: Misty Stanley M.D.   On: 08/07/2015 17:09    Scheduled Meds: . aspirin EC  325 mg Oral Daily  . docusate sodium  100 mg Oral QODAY  . febuxostat  40 mg Oral Daily  . gabapentin  300 mg Oral BID  . hydroxychloroquine  400 mg Oral Daily  . insulin aspart  0-9 Units Subcutaneous TID WC  . linagliptin  5 mg Oral Daily  . montelukast  10 mg Oral QHS  . pantoprazole  40 mg Oral Daily  . pentoxifylline  400 mg Oral Daily  . pravastatin  40 mg Oral Daily  . predniSONE  20 mg Oral QAC breakfast   Continuous Infusions: . sodium chloride 150 mL/hr at 08/07/15 0027       Time spent: 35 minutes    [facility]  [facility] Pager [phone] If 7PM-7AM, please contact night-coverage at [email or web address], password [facility] 08/08/2015, 12:40 PM

## 2015-08-08 NOTE — Progress Notes (Signed)
Patient Name: Tricia Gonzales Date of Encounter: 08/08/2015  Principal Problem:   Chest pain Active Problems:   Pacemaker   Diabetes mellitus type 2, controlled ([place])   Hyperlipidemia   Hypertension   CKD (chronic kidney disease) stage 3, GFR 30-59 ml/min   Chronic anemia   Pain in the chest   Acute pericarditis   Length of Stay:   SUBJECTIVE  Chest pain is gone.  CURRENT MEDS . aspirin EC  325 mg Oral Daily  . azithromycin  500 mg Intravenous Q24H  . cefTRIAXone (ROCEPHIN)  IV  1 g Intravenous Q24H  . docusate sodium  100 mg Oral QODAY  . febuxostat  40 mg Oral Daily  . gabapentin  300 mg Oral BID  . guaiFENesin  1,200 mg Oral BID  . hydroxychloroquine  400 mg Oral Daily  . insulin aspart  0-9 Units Subcutaneous TID WC  . linagliptin  5 mg Oral Daily  . montelukast  10 mg Oral QHS  . pantoprazole  40 mg Oral Daily  . pentoxifylline  400 mg Oral Daily  . pravastatin  40 mg Oral Daily  . predniSONE  20 mg Oral QAC breakfast    OBJECTIVE   Intake/Output Summary (Last 24 hours) at 08/08/15 1353 Last data filed at 08/08/15 0930  Gross per 24 hour  Intake    575 ml  Output    850 ml  Net   -275 ml   Filed Weights   08/06/15 0500  Weight: 113.2 kg (249 lb 9 oz)    PHYSICAL EXAM Filed Vitals:   08/08/15 0600 08/08/15 0700 08/08/15 0800 08/08/15 1252  BP: 159/59 139/52 141/66 147/83  Pulse: 79 72 82 83  Temp:   98.3 F (36.8 C) 97.7 F (36.5 C)  TempSrc:   Oral Oral  Resp: _0 Height:      Weight:      SpO2: 96% 96% 97% 94%   General: Alert, oriented x3, no distress, pale Head: no evidence of trauma, PERRL, EOMI, no exophtalmos or lid lag, no myxedema, no xanthelasma; normal ears, nose and oropharynx Neck: normal jugular venous pulsations and no hepatojugular reflux; brisk carotid pulses without delay and no carotid bruits Chest: clear to auscultation, no signs of consolidation by percussion or palpation, normal fremitus, symmetrical and full  respiratory excursions Cardiovascular: normal position and quality of the apical impulse, regular rhythm, normal first and widely split second heart sounds, no rubs or gallops, soft 1-2/5 ejection murmur Abdomen: no tenderness or distention, no masses by palpation, no abnormal pulsatility or arterial bruits, normal bowel sounds, no hepatosplenomegaly Extremities: no clubbing, cyanosis or edema; 2+ radial, ulnar and brachial pulses bilaterally; 2+ right femoral, posterior tibial and dorsalis pedis pulses; 2+ left femoral, posterior tibial and dorsalis pedis pulses; no subclavian or femoral bruits Neurological: grossly nonfocal  LABS  CBC  Recent Labs  08/06/15 1321 08/06/15 2104  08/07/15 1701 08/08/15 0528  WBC 21.1* 21.4*  < > 16.7* 14.5*  NEUTROABS 19.5* 19.3*  --   --   --   HGB 7.3* 7.1*  < > 8.8* 8.6*  HCT 25.0* 23.9*  < > 27.3* 28.4*  MCV 87.1 87.5  < > 87.5 87.4  PLT 282 278  < > 245 262  < > = values in this interval not displayed. Basic Metabolic Panel  Recent Labs  08/07/15 1058 08/08/15 0528  NA 141 138  K 4.8 4.8  CL 110 107  CO2 21*  21*  GLUCOSE 158* 279*  BUN 66* 69*  CREATININE 2.23* 2.08*  CALCIUM 7.3* 7.4*   Liver Function Tests  Recent Labs  08/06/15 0146  AST 16  ALT 12*  ALKPHOS 57  BILITOT 0.6  PROT 6.2*  ALBUMIN 3.3*    Recent Labs  08/06/15 0146  LIPASE 25   Cardiac Enzymes  Recent Labs  08/06/15 1321 08/06/15 1647 08/06/15 2356  TROPONINI <0.03 0.04* 0.04*   BNP Invalid input(s): POCBNP D-Dimer  Recent Labs  08/06/15 0147  DDIMER 1.44*   Radiology Studies Imaging results have been reviewed and Ct Chest Wo Contrast  08/07/2015  CLINICAL DATA:  Moderate to severe chest pain EXAM: CT CHEST WITHOUT CONTRAST TECHNIQUE: Multidetector CT imaging of the chest was performed following the standard protocol without IV contrast. COMPARISON:  Chest x-ray 08/06/2015.  Chest CT 08/17/2014 FINDINGS: Mediastinum / Lymph Nodes: There  is no axillary lymphadenopathy. 11 mm right thyroid nodule noted. No mediastinal lymphadenopathy. There is no hilar lymphadenopathy. Heart is enlarged. Trace pericardial fluid/thickening noted. The esophagus has normal imaging features. Left dual lead permanent pacemaker again noted. Lungs / Pleura: Bilateral lower lobe collapse/consolidation, right greater than left. There is some trace probable atelectasis in the posterior left upper lobe. Small bilateral pleural effusions, right greater than left. Upper Abdomen:  Unremarkable. MSK / Soft Tissues: Bone windows reveal no worrisome lytic or sclerotic osseous lesions. IMPRESSION: Low volume film with bilateral lower lobe collapse/ consolidation (right greater than left) and small bilateral pleural effusions. Electronically Signed   By: Misty Stanley M.D.   On: 08/07/2015 17:09   V/Q scan 08/06/15 FINDINGS: Ventilation: Mild clumping is noted centrally. No significant ventilation defect is identified.  Perfusion: Adequate perfusion is noted. A defect is noted related to the pacemaker pack anteriorly on the left. No perfusion defect to suggest pulmonary embolism is seen.  IMPRESSION: No evidence of pulmonary embolism.  TELE NSR  ASSESSMENT AND PLAN  She appears to have resolving acute pericarditis or pleuripericarditis. Would suggest rapid wean of her steroids to reduce risk of side effects. No evidence of significant pericardial effusion. She already has an appointment scheduled with Dr. Lovena Le in March.  Sanda Klein, MD, [facility] [phone] office [phone] pager 08/08/2015 1:53 PM

## 2015-08-09 DIAGNOSIS — N390 Urinary tract infection, site not specified: Secondary | ICD-10-CM | POA: Diagnosis present

## 2015-08-09 LAB — BASIC METABOLIC PANEL
Anion gap: 8 (ref 5–15)
BUN: 71 mg/dL — ABNORMAL HIGH (ref 6–20)
CO2: 24 mmol/L (ref 22–32)
Calcium: 7.5 mg/dL — ABNORMAL LOW (ref 8.9–10.3)
Chloride: 109 mmol/L (ref 101–111)
Creatinine, Ser: 1.84 mg/dL — ABNORMAL HIGH (ref 0.44–1.00)
GFR calc Af Amer: 30 mL/min — ABNORMAL LOW (ref >60)
GFR calc non Af Amer: 26 mL/min — ABNORMAL LOW (ref >60)
Glucose, Bld: 179 mg/dL — ABNORMAL HIGH (ref 65–99)
Potassium: 5.1 mmol/L (ref 3.5–5.1)
Sodium: 141 mmol/L (ref 135–145)

## 2015-08-09 LAB — OCCULT BLOOD X 1 CARD TO LAB, STOOL: Fecal Occult Bld: POSITIVE — AB

## 2015-08-09 LAB — URINALYSIS W MICROSCOPIC (NOT AT ARMC)
Bilirubin Urine: NEGATIVE
Glucose, UA: NEGATIVE mg/dL
Hgb urine dipstick: NEGATIVE
Ketones, ur: NEGATIVE mg/dL
Nitrite: NEGATIVE
Protein, ur: NEGATIVE mg/dL
RBC / HPF: NONE SEEN RBC/hpf (ref 0–5)
Specific Gravity, Urine: 1.017 (ref 1.005–1.030)
pH: 5 (ref 5.0–8.0)

## 2015-08-09 LAB — CBC
HCT: 25.8 % — ABNORMAL LOW (ref 36.0–46.0)
Hemoglobin: 7.9 g/dL — ABNORMAL LOW (ref 12.0–15.0)
MCH: 26.7 pg (ref 26.0–34.0)
MCHC: 30.6 g/dL (ref 30.0–36.0)
MCV: 87.2 fL (ref 78.0–100.0)
Platelets: 268 10*3/uL (ref 150–400)
RBC: 2.96 MIL/uL — ABNORMAL LOW (ref 3.87–5.11)
RDW: 16.1 % — ABNORMAL HIGH (ref 11.5–15.5)
WBC: 9.5 10*3/uL (ref 4.0–10.5)

## 2015-08-09 LAB — GLUCOSE, CAPILLARY
Glucose-Capillary: 156 mg/dL — ABNORMAL HIGH (ref 65–99)
Glucose-Capillary: 311 mg/dL — ABNORMAL HIGH (ref 65–99)
Glucose-Capillary: 272 mg/dL — ABNORMAL HIGH (ref 65–99)
Glucose-Capillary: 188 mg/dL — ABNORMAL HIGH (ref 65–99)

## 2015-08-09 MED ORDER — POLYETHYLENE GLYCOL 3350 17 G PO PACK
17.0000 g | PACK | Freq: Every day | ORAL | Status: DC
  Administered 2015-08-09 – 2015-08-12 (×3): 17 g via ORAL
  Filled 2015-08-09 (×4): qty 1

## 2015-08-09 MED ORDER — FUROSEMIDE 10 MG/ML IJ SOLN
40.0000 mg | Freq: Once | INTRAMUSCULAR | Status: AC
  Administered 2015-08-09: 40 mg via INTRAVENOUS
  Filled 2015-08-09: qty 4

## 2015-08-09 MED ORDER — SORBITOL 70 % SOLN
100.0000 mL | Freq: Once | Status: AC
  Administered 2015-08-09: 100 mL via ORAL
  Filled 2015-08-09: qty 120

## 2015-08-09 NOTE — Progress Notes (Signed)
Inpatient Diabetes Program Recommendations  AACE/ADA: New Consensus Statement on Inpatient Glycemic Control (2015)  Target Ranges:  Prepandial:   less than 140 mg/dL      Peak postprandial:   less than 180 mg/dL (1-2 hours)      Critically ill patients:  140 - 180 mg/dL   Review of Glycemic Control  Results for CAMRIE, STOCK (MRN [id number]) as of 08/09/2015 10:20  Ref. Range 08/08/2015 08:08 08/08/2015 12:51 08/08/2015 16:49 08/08/2015 20:55 08/09/2015 05:56  Glucose-Capillary Latest Ref Range: 65-99 mg/dL 237 (H) 266 (H) 230 (H) 223 (H) 156 (H)    Diabetes history: Type 2 Outpatient Diabetes medications: Glucotrol 66m bid, Januvia 563mday Current orders for Inpatient glycemic control: Tradgenta 21m48may, Novolog 0-9 units tid, Novolog 0-5 units qhs  Inpatient Diabetes Program Recommendations: Blood sugars remain elevated- consider increasing Novolog correction to moderate correction scale.  JulGentry FitzN, BA, MHA, CDE Diabetes Coordinator Inpatient Diabetes Program  [phone] Pager) [phone]/17/2017 10:21 AM

## 2015-08-09 NOTE — Progress Notes (Signed)
On exam RN noted left arm swelling significantly > right. MD pg'd to make aware.

## 2015-08-09 NOTE — Progress Notes (Addendum)
Pt states bm was dark black, possibly tarry but loose. Pt used toilet instead of BSC. Instructed RN will need to hemocult next one due to hgb trending down, hat placed in toilet.

## 2015-08-09 NOTE — Progress Notes (Signed)
Pt hemoccult positive.  Pt having no stools at this time, resting comfortably.  Lynch with [facility] notified.  Will recheck CBC in AM.  RN will continue to monitor pt closely.  Claudette Stapler, RN

## 2015-08-09 NOTE — Plan of Care (Signed)
Problem: Acute Rehab PT Goals(only PT should resolve) Goal: Pt Will Go Supine/Side To Sit With bed level

## 2015-08-09 NOTE — Evaluation (Signed)
Physical Therapy Evaluation Patient Details Name: Tricia Gonzales MRN: [id number] DOB: 10-11-1940 Today's Date: 08/09/2015   History of Present Illness  75 yo female with onset of chest pain and had pacemaker implanted Dec 2016, now  has PT consult for mobility  Clinical Impression  Pt was seen for evaluation of her mobility with sats as follows:SATURATION QUALIFICATIONS: (This note is used to comply with regulatory documentation for home oxygen)  Patient Saturations on Room Air at Rest = 91%  Patient Saturations on Room Air while Ambulating = 82%  Patient Saturations on 2 Liters of oxygen while Ambulating = 97%  Please briefly explain why patient needs home oxygen:  On a very short distance was down to 82% on room air, not able to sustain enough activity safely at home alone.    Follow Up Recommendations SNF    Equipment Recommendations  None recommended by PT    Recommendations for Other Services Rehab consult     Precautions / Restrictions Precautions Precautions: Fall;ICD/Pacemaker Restrictions Weight Bearing Restrictions: No      Mobility  Bed Mobility Overal bed mobility: Modified Independent             General bed mobility comments: uses HOB elevated and railing  Transfers Overall transfer level: Needs assistance Equipment used: Rolling walker (2 wheeled);1 person hand held assist Transfers: Sit to/from [facility] Sit to Stand: Min guard Stand pivot transfers: Min guard       General transfer comment: has limited use of her LE's due to fatigue and SOB  Ambulation/Gait Ambulation/Gait assistance: Min guard Ambulation Distance (Feet): 60 Feet Assistive device: Rolling walker (2 wheeled) Gait Pattern/deviations: Step-through pattern;Wide base of support;Drifts right/left Gait velocity: decreased Gait velocity interpretation: Below normal speed for age/gender General Gait Details: Pt is getting up with assist from nursing here and  needs help, not willing to consider rehab option  Stairs            Wheelchair Mobility    Modified Rankin (Stroke Patients Only)       Balance Overall balance assessment: Needs assistance Sitting-balance support: Feet supported Sitting balance-Leahy Scale: Good     Standing balance support: Bilateral upper extremity supported Standing balance-Leahy Scale: Fair Standing balance comment: fair- dynamic without walker                             Pertinent Vitals/Pain Pain Assessment: No/denies pain    Home Living Family/patient expects to be discharged to:: Private residence Living Arrangements: Alone   Type of Home: House Home Access: Stairs to enter Entrance Stairs-Rails: Psychiatric nurse of Steps: 3 Home Layout: One level Home Equipment: Cane - single point;Walker - 2 wheels;Adaptive equipment      Prior Function Level of Independence: Independent with assistive device(s)         Comments: has been using RW since LLE stress fx (unknown cause, no fall)     Hand Dominance        Extremity/Trunk Assessment   Upper Extremity Assessment: Overall WFL for tasks assessed           Lower Extremity Assessment: Generalized weakness      Cervical / Trunk Assessment: Kyphotic  Communication   Communication: No difficulties  Cognition Arousal/Alertness: Awake/alert Behavior During Therapy: WFL for tasks assessed/performed Overall Cognitive Status: Within Functional Limits for tasks assessed  General Comments General comments (skin integrity, edema, etc.): Pt is tested for O2 sats pregait as she was SOB when nursing took her to BR.  Was 91% at rest off O2 then walked 60', was 82% after gait.  Reapplied O2 2L and was 97% fairly quickly with pulse 78.    Exercises        Assessment/Plan    PT Assessment Patient needs continued PT services  PT Diagnosis Difficulty walking;Other (comment)  (cardiac care)   PT Problem List Decreased strength;Decreased range of motion;Decreased activity tolerance;Decreased mobility;Decreased balance;Decreased coordination;Decreased knowledge of use of DME;Cardiopulmonary status limiting activity;Obesity  PT Treatment Interventions DME instruction;Gait training;Stair training;Functional mobility training;Therapeutic activities;Therapeutic exercise;Balance training;Neuromuscular re-education;Patient/family education   PT Goals (Current goals can be found in the Care Plan section) Acute Rehab PT Goals Patient Stated Goal: return home PT Goal Formulation: With patient Time For Goal Achievement: 08/23/15 Potential to Achieve Goals: Good    Frequency Min 3X/week   Barriers to discharge Decreased caregiver support;Inaccessible home environment home alone with RW and O2 needed    Co-evaluation               End of Session Equipment Utilized During Treatment: Gait belt;Oxygen Activity Tolerance: Patient tolerated treatment well Patient left: in bed;with call bell/phone within reach Nurse Communication: Mobility status         Time: 0722-5750 PT Time Calculation (min) (ACUTE ONLY): 24 min   Charges:   PT Evaluation $PT Eval Moderate Complexity: 1 Procedure PT Treatments $Gait Training: 8-22 mins   PT G Codes:        Ramond Dial 08/21/15, 10:54 AM   Mee Hives, PT MS Acute Rehab Dept. Number: [phone] [id number] and [place] [phone]

## 2015-08-09 NOTE — Progress Notes (Signed)
[facility] PROGRESS NOTE   Tricia Gonzales OEU:[id number] DOB: 1941-03-29 DOA: 08/06/2015 PCP: Reginia Naas, MD  HPI/Subjective: Feeling more short of breath today but overall she is feeling better than the day she came in.  Assessment/Plan: Principal Problem:   Chest pain Active Problems:   Pacemaker   Diabetes mellitus type 2, controlled ([facility])   Hyperlipidemia   Hypertension   CKD (chronic kidney disease) stage 3, GFR 30-59 ml/min   Chronic anemia   Acute pericarditis   UTI (urinary tract infection)   Chest pain Pleuritic, has positive d-dimer is, VQ scan is negative for PEs. Patient also has -3 sets of cardiac enzymes and 12-lead EKG. 2-D echo showed no evidence of pericardial effusion, normal LVEF. Per cardiology this is likely acute pleuropericarditis, improving with a steroids.  Community acquired pneumonia, presumed Patient came in with chest pain and fever, not much of SOB and sputum production. CT scan of the chest showed atelectasis versus infiltrates. Because of patient fever, leukocytosis and increased procalcitonin, I well start her on antibiotics for pneumonia. Supportive management with mucolytics, bronchial dilators, antitussives and oxygen as needed.  UTI Urinalysis is consistent with UTI, on Rocephin. Culture growing gram-negative rods, just antibiotics according to culture results.  Hypotension Unclear etiology, could be secondary to diuretics and opioid narcotics. Blood pressure is reasonable today, continue to follow closely.  History of second-degree AV block Permanent pacemaker in place, interrogated by cardiology.  Anemia Severe anemia with hemoglobin of 6.3, hemoglobin baseline is 8.2 from December 2016. This acute anemia on top of anemia of chronic disease secondary to CKD stage III, no evidence of bleeding. CT scan showed atelectasis versus consultation.  Acute renal failure and chronic kidney disease stage III. Patient has  chronic disease with creatinine baseline of 1.8, presented with creatinine of 2.26. Discontinue IV fluids, creatinine is back to baseline patient might need some diuresis. Check BMP in a.m.    Code Status: Full Code Family Communication: Plan discussed with the patient. Disposition Plan: Remains inpatient Diet: Diet renal/carb modified with fluid restriction Diet-HS Snack?: Nothing; Room service appropriate?: Yes; Fluid consistency:: Thin  Consultants:  Cards  Procedures:  None  Antibiotics:  None   Objective: Filed Vitals:   08/08/15 2022 08/09/15 0534  BP: 126/63 142/51  Pulse: 79 80  Temp: 98.4 F (36.9 C) 98.5 F (36.9 C)  Resp: 18 20    Intake/Output Summary (Last 24 hours) at 08/09/15 1157 Last data filed at 08/09/15 1011  Gross per 24 hour  Intake    715 ml  Output    500 ml  Net    215 ml   Filed Weights   08/06/15 0500 08/09/15 0534  Weight: 113.2 kg (249 lb 9 oz) 122.5 kg (270 lb 1 oz)    Exam: General: Alert and awake, oriented x3, not in any acute distress. HEENT: anicteric sclera, pupils reactive to light and accommodation, EOMI CVS: S1-S2 clear, no murmur rubs or gallops Chest: clear to auscultation bilaterally, no wheezing, rales or rhonchi Abdomen: soft nontender, nondistended, normal bowel sounds, no organomegaly Extremities: no cyanosis, clubbing or edema noted bilaterally Neuro: Cranial nerves II-XII intact, no focal neurological deficits  Data Reviewed: Basic Metabolic Panel:  Recent Labs Lab 08/06/15 1321 08/06/15 2104 08/07/15 1058 08/08/15 0528 08/09/15 0320  NA 140 136 141 138 141  K 5.1 5.2* 4.8 4.8 5.1  CL 108 104 110 107 109  CO2 22 20* 21* 21* 24  GLUCOSE 250* 271* 158* 279* 179*  BUN 69* 71* 66* 69* 71*  CREATININE 1.87* 2.28* 2.23* 2.08* 1.84*  CALCIUM 8.1* 7.8* 7.3* 7.4* 7.5*   Liver Function Tests:  Recent Labs Lab 08/06/15 0146  AST 16  ALT 12*  ALKPHOS 57  BILITOT 0.6  PROT 6.2*  ALBUMIN 3.3*     Recent Labs Lab 08/06/15 0146  LIPASE 25   No results for input(s): AMMONIA in the last 168 hours. CBC:  Recent Labs Lab 08/06/15 0146 08/06/15 1321 08/06/15 2104 08/07/15 0500 08/07/15 1701 08/08/15 0528 08/09/15 0320  WBC 11.1* 21.1* 21.4* 15.7* 16.7* 14.5* 9.5  NEUTROABS 9.7* 19.5* 19.3*  --   --   --   --   HGB 7.8* 7.3* 7.1* 6.3* 8.8* 8.6* 7.9*  HCT 26.1* 25.0* 23.9* 21.3* 27.3* 28.4* 25.8*  MCV 86.4 87.1 87.5 88.0 87.5 87.4 87.2  PLT 276 282 278 250 245 262 268   Cardiac Enzymes:  Recent Labs Lab 08/06/15 1321 08/06/15 1647 08/06/15 2356  TROPONINI <0.03 0.04* 0.04*   BNP (last 3 results)  Recent Labs  08/06/15 0146  BNP 233.6*    ProBNP (last 3 results) No results for input(s): PROBNP in the last 8760 hours.  CBG:  Recent Labs Lab 08/08/15 1251 08/08/15 1649 08/08/15 2055 08/09/15 0556 08/09/15 1114  GLUCAP 266* 230* 223* 156* 311*    Micro Recent Results (from the past 240 hour(s))  MRSA PCR Screening     Status: None   Collection Time: 08/06/15 11:24 AM  Result Value Ref Range Status   MRSA by PCR NEGATIVE NEGATIVE Final    Comment:        The GeneXpert MRSA Assay (FDA approved for NASAL specimens only), is one component of a comprehensive MRSA colonization surveillance program. It is not intended to diagnose MRSA infection nor to guide or monitor treatment for MRSA infections.   Culture, blood (routine x 2)     Status: None (Preliminary result)   Collection Time: 08/06/15  8:46 PM  Result Value Ref Range Status   Specimen Description BLOOD LEFT HAND  Final   Special Requests   Final    BOTTLES DRAWN AEROBIC AND ANAEROBIC 4.5CC BLUE 5CC RED   Culture NO GROWTH 2 DAYS  Final   Report Status PENDING  Incomplete  Culture, Urine     Status: None (Preliminary result)   Collection Time: 08/06/15  9:37 PM  Result Value Ref Range Status   Specimen Description URINE, CLEAN CATCH  Final   Special Requests NONE  Final   Culture    Final    >=100,000 COLONIES/mL GRAM NEGATIVE RODS CULTURE REINCUBATED FOR BETTER GROWTH    Report Status PENDING  Incomplete  Culture, blood (routine x 2)     Status: None (Preliminary result)   Collection Time: 08/06/15 11:56 PM  Result Value Ref Range Status   Specimen Description BLOOD LEFT HAND  Final   Special Requests IN PEDIATRIC BOTTLE 1CC  Final   Culture NO GROWTH 1 DAY  Final   Report Status PENDING  Incomplete     Studies: Ct Chest Wo Contrast  08/07/2015  CLINICAL DATA:  Moderate to severe chest pain EXAM: CT CHEST WITHOUT CONTRAST TECHNIQUE: Multidetector CT imaging of the chest was performed following the standard protocol without IV contrast. COMPARISON:  Chest x-ray 08/06/2015.  Chest CT 08/17/2014 FINDINGS: Mediastinum / Lymph Nodes: There is no axillary lymphadenopathy. 11 mm right thyroid nodule noted. No mediastinal lymphadenopathy. There is no hilar lymphadenopathy. Heart is enlarged. Trace  pericardial fluid/thickening noted. The esophagus has normal imaging features. Left dual lead permanent pacemaker again noted. Lungs / Pleura: Bilateral lower lobe collapse/consolidation, right greater than left. There is some trace probable atelectasis in the posterior left upper lobe. Small bilateral pleural effusions, right greater than left. Upper Abdomen:  Unremarkable. MSK / Soft Tissues: Bone windows reveal no worrisome lytic or sclerotic osseous lesions. IMPRESSION: Low volume film with bilateral lower lobe collapse/ consolidation (right greater than left) and small bilateral pleural effusions. Electronically Signed   By: Misty Stanley M.D.   On: 08/07/2015 17:09    Scheduled Meds: . aspirin EC  325 mg Oral Daily  . azithromycin  500 mg Intravenous Q24H  . cefTRIAXone (ROCEPHIN)  IV  1 g Intravenous Q24H  . docusate sodium  100 mg Oral QODAY  . febuxostat  40 mg Oral Daily  . gabapentin  300 mg Oral BID  . guaiFENesin  1,200 mg Oral BID  . hydroxychloroquine  400 mg Oral  Daily  . insulin aspart  0-5 Units Subcutaneous QHS  . insulin aspart  0-9 Units Subcutaneous TID WC  . linagliptin  5 mg Oral Daily  . montelukast  10 mg Oral QHS  . pantoprazole  40 mg Oral Daily  . pentoxifylline  400 mg Oral Daily  . pravastatin  40 mg Oral Daily  . predniSONE  20 mg Oral QAC breakfast   Continuous Infusions:       Time spent: 35 minutes    [facility]  [facility] Pager [phone] If 7PM-7AM, please contact night-coverage at [email or web address], password [facility] 08/09/2015, 11:57 AM  LOS: 1 day

## 2015-08-09 NOTE — Evaluation (Signed)
Occupational Therapy Evaluation Patient Details Name: Tricia Gonzales MRN: [id number] DOB: 20-Apr-1941 Today's Date: 08/09/2015    History of Present Illness 75 yo female with onset of chest pain and had pacemaker implanted Dec 2016, now  has PT consult for mobility   Clinical Impression   Pt was independent with AD prior to admission. Presents with decreased activity tolerance and impaired standing balance interfering with ability to perform at her baseline.  Will follow acutely.    Follow Up Recommendations  SNF (Pt is refusing, will need HHOT and is requesting HHRN.)    Equipment Recommendations       Recommendations for Other Services       Precautions / Restrictions Precautions Precautions: Fall;ICD/Pacemaker Precaution Comments: Pt with ?stress fx in LLE, was not able to go to ortho appointment this week due to illness Restrictions Weight Bearing Restrictions: No      Mobility Bed Mobility Overal bed mobility: Modified Independent             General bed mobility comments: uses HOB elevated and railing  Transfers Overall transfer level: Needs assistance Equipment used: Rolling walker (2 wheeled) Transfers: Sit to/from [facility] Sit to Stand: Min guard Stand pivot transfers: Min guard       General transfer comment: slow to stand, but does not require assist    Balance Overall balance assessment: Needs assistance Sitting-balance support: Feet supported Sitting balance-Leahy Scale: Good     Standing balance support: Bilateral upper extremity supported Standing balance-Leahy Scale: Fair Standing balance comment: fair- dynamic without walker                            ADL Overall ADL's : Needs assistance/impaired Eating/Feeding: Independent;Sitting   Grooming: Wash/dry hands;Supervision/safety;Sitting   Upper Body Bathing: Set up;Sitting   Lower Body Bathing: Minimal assistance;Sit to/from stand   Upper Body  Dressing : Set up;Sitting   Lower Body Dressing: Minimal assistance;Sit to/from stand   Toilet Transfer: Min guard;RW;Ambulation;Comfort height toilet;Grab bars   Toileting- Water quality scientist and Hygiene: Min guard;Sit to/from stand       Functional mobility during ADLs: Min guard;Rolling walker General ADL Comments: Instructed in pursed lip breathing.     Vision     Perception     Praxis      Pertinent Vitals/Pain Pain Assessment: No/denies pain     Hand Dominance Right   Extremity/Trunk Assessment Upper Extremity Assessment Upper Extremity Assessment: Overall WFL for tasks assessed (arthritic changes in hands)   Lower Extremity Assessment Lower Extremity Assessment: Defer to PT evaluation   Cervical / Trunk Assessment Cervical / Trunk Assessment: Kyphotic   Communication Communication Communication: No difficulties   Cognition Arousal/Alertness: Awake/alert Behavior During Therapy: WFL for tasks assessed/performed Overall Cognitive Status: Within Functional Limits for tasks assessed                     General Comments       Exercises       Shoulder Instructions      Home Living Family/patient expects to be discharged to:: Private residence Living Arrangements: Alone Available Help at Discharge: Family;Friend(s);Available PRN/intermittently Type of Home: House Home Access: Stairs to enter [employer] of Steps: 3 Entrance Stairs-Rails: Right;Left Home Layout: One level         Bathroom Toilet: Handicapped height Bathroom Accessibility: Yes   Home Equipment: Cane - single point;Walker - 2 wheels;Adaptive equipment Adaptive Equipment: Reacher Additional  Comments: has friends and family that can come in and out      Prior Functioning/Environment Level of Independence: Independent with assistive device(s)        Comments: has been using RW since LLE stress fx (unknown cause, no fall), has a housekeeper, friends have  been bringing in food    OT Diagnosis: Generalized weakness   OT Problem List: Decreased strength;Decreased activity tolerance;Impaired balance (sitting and/or standing);Cardiopulmonary status limiting activity;Obesity   OT Treatment/Interventions: Self-care/ADL training;Energy conservation;Patient/family education    OT Goals(Current goals can be found in the care plan section) Acute Rehab OT Goals Patient Stated Goal: return home OT Goal Formulation: With patient Time For Goal Achievement: 08/16/15 Potential to Achieve Goals: Good  OT Frequency: Min 2X/week   Barriers to D/C: Decreased caregiver support          Co-evaluation              End of Session Equipment Utilized During Treatment: Oxygen;Rolling walker  Activity Tolerance: Patient limited by fatigue Patient left: in chair;with call bell/phone within reach;with family/visitor present   Time: 3735-7897 OT Time Calculation (min): 28 min Charges:  OT General Charges $OT Visit: 1 Procedure OT Evaluation $OT Eval Moderate Complexity: 1 Procedure OT Treatments $Self Care/Home Management : 8-22 mins G-Codes:    Malka So 08/09/2015, 12:10 PM  [phone]

## 2015-08-10 ENCOUNTER — Inpatient Hospital Stay (HOSPITAL_COMMUNITY): Payer: Medicare Other

## 2015-08-10 DIAGNOSIS — R195 Other fecal abnormalities: Secondary | ICD-10-CM | POA: Diagnosis present

## 2015-08-10 DIAGNOSIS — M7989 Other specified soft tissue disorders: Secondary | ICD-10-CM

## 2015-08-10 DIAGNOSIS — N183 Chronic kidney disease, stage 3 (moderate): Secondary | ICD-10-CM

## 2015-08-10 DIAGNOSIS — R079 Chest pain, unspecified: Secondary | ICD-10-CM | POA: Diagnosis present

## 2015-08-10 DIAGNOSIS — D649 Anemia, unspecified: Secondary | ICD-10-CM

## 2015-08-10 DIAGNOSIS — E875 Hyperkalemia: Secondary | ICD-10-CM

## 2015-08-10 LAB — TYPE AND SCREEN
ABO/RH(D): A POS
Antibody Screen: NEGATIVE
Unit division: 0
Unit division: 0
Unit division: 0
Unit division: 0

## 2015-08-10 LAB — URINE CULTURE: Culture: >=100000

## 2015-08-10 LAB — CBC
HCT: 26.5 % — ABNORMAL LOW (ref 36.0–46.0)
Hemoglobin: 8 g/dL — ABNORMAL LOW (ref 12.0–15.0)
MCH: 26.6 pg (ref 26.0–34.0)
MCHC: 30.2 g/dL (ref 30.0–36.0)
MCV: 88 fL (ref 78.0–100.0)
Platelets: 280 10*3/uL (ref 150–400)
RBC: 3.01 MIL/uL — ABNORMAL LOW (ref 3.87–5.11)
RDW: 16.3 % — ABNORMAL HIGH (ref 11.5–15.5)
WBC: 8 10*3/uL (ref 4.0–10.5)

## 2015-08-10 LAB — BASIC METABOLIC PANEL WITH GFR
Anion gap: 6 (ref 5–15)
BUN: 68 mg/dL — ABNORMAL HIGH (ref 6–20)
CO2: 24 mmol/L (ref 22–32)
Calcium: 7.7 mg/dL — ABNORMAL LOW (ref 8.9–10.3)
Chloride: 111 mmol/L (ref 101–111)
Creatinine, Ser: 1.62 mg/dL — ABNORMAL HIGH (ref 0.44–1.00)
GFR calc Af Amer: 35 mL/min — ABNORMAL LOW
GFR calc non Af Amer: 30 mL/min — ABNORMAL LOW
Glucose, Bld: 221 mg/dL — ABNORMAL HIGH (ref 65–99)
Potassium: 5.2 mmol/L — ABNORMAL HIGH (ref 3.5–5.1)
Sodium: 141 mmol/L (ref 135–145)

## 2015-08-10 LAB — GLUCOSE, CAPILLARY
Glucose-Capillary: 198 mg/dL — ABNORMAL HIGH (ref 65–99)
Glucose-Capillary: 280 mg/dL — ABNORMAL HIGH (ref 65–99)
Glucose-Capillary: 352 mg/dL — ABNORMAL HIGH (ref 65–99)
Glucose-Capillary: 228 mg/dL — ABNORMAL HIGH (ref 65–99)

## 2015-08-10 LAB — PROCALCITONIN: Procalcitonin: 0.43 ng/mL

## 2015-08-10 NOTE — Progress Notes (Signed)
SUBJECTIVE:  No chest pain.     PHYSICAL EXAM Filed Vitals:   08/09/15 0534 08/09/15 1437 08/09/15 2015 08/10/15 0526  BP: 142/51 142/74 139/44 146/64  Pulse: 80 71 67 72  Temp: 98.5 F (36.9 C) 97.9 F (36.6 C) 98.2 F (36.8 C) 98.2 F (36.8 C)  TempSrc: Oral Oral Oral Oral  Resp: _0 Height:      Weight: 270 lb 1 oz (122.5 kg)     SpO2: 95% 99% 99% 100%   General:  No acute distress Lungs:  Clear Heart:  RRR Abdomen:  Positive bowel sounds, no rebound no guarding Extremities:  Moderate edema  LABS: Lab Results  Component Value Date   TROPONINI 0.04* 08/06/2015   Results for orders placed or performed during the hospital encounter of 08/06/15 (from the past 24 hour(s))  Glucose, capillary     Status: Abnormal   Collection Time: 08/09/15 11:14 AM  Result Value Ref Range   Glucose-Capillary 311 (H) 65 - 99 mg/dL  Glucose, capillary     Status: Abnormal   Collection Time: 08/09/15  4:32 PM  Result Value Ref Range   Glucose-Capillary 272 (H) 65 - 99 mg/dL  Occult blood card to lab, stool     Status: Abnormal   Collection Time: 08/09/15  7:10 PM  Result Value Ref Range   Fecal Occult Bld POSITIVE (A) NEGATIVE  Glucose, capillary     Status: Abnormal   Collection Time: 08/09/15  9:18 PM  Result Value Ref Range   Glucose-Capillary 188 (H) 65 - 99 mg/dL  CBC     Status: Abnormal   Collection Time: 08/10/15  4:55 AM  Result Value Ref Range   WBC 8.0 4.0 - 10.5 K/uL   RBC 3.01 (L) 3.87 - 5.11 MIL/uL   Hemoglobin 8.0 (L) 12.0 - 15.0 g/dL   HCT 26.5 (L) 36.0 - 46.0 %   MCV 88.0 78.0 - 100.0 fL   MCH 26.6 26.0 - 34.0 pg   MCHC 30.2 30.0 - 36.0 g/dL   RDW 16.3 (H) 11.5 - 15.5 %   Platelets 280 150 - 400 K/uL  Procalcitonin     Status: None   Collection Time: 08/10/15  4:55 AM  Result Value Ref Range   Procalcitonin 0.43 ng/mL  Basic metabolic panel     Status: Abnormal   Collection Time: 08/10/15  4:55 AM  Result Value Ref Range   Sodium 141 135 -  145 mmol/L   Potassium 5.2 (H) 3.5 - 5.1 mmol/L   Chloride 111 101 - 111 mmol/L   CO2 24 22 - 32 mmol/L   Glucose, Bld 221 (H) 65 - 99 mg/dL   BUN 68 (H) 6 - 20 mg/dL   Creatinine, Ser 1.62 (H) 0.44 - 1.00 mg/dL   Calcium 7.7 (L) 8.9 - 10.3 mg/dL   GFR calc non Af Amer 30 (L) >60 mL/min   GFR calc Af Amer 35 (L) >60 mL/min   Anion gap 6 5 - 15  Glucose, capillary     Status: Abnormal   Collection Time: 08/10/15  5:59 AM  Result Value Ref Range   Glucose-Capillary 198 (H) 65 - 99 mg/dL    Intake/Output Summary (Last 24 hours) at 08/10/15 0856 Last data filed at 08/09/15 1901  Gross per 24 hour  Intake    480 ml  Output    750 ml  Net   -270 ml    ASSESSMENT AND PLAN:  CHEST PAIN:  Possible pericarditis or pleuropericarditis.  Continue steroid taper.  No further cardiac issues.   Call with further issues.   Minus Breeding 08/10/2015 8:56 AM

## 2015-08-10 NOTE — Consult Note (Signed)
Consultation  Referring Provider: [facility] Primary Care Physician:  Reginia Naas, MD Primary Gastroenterologist:  Dr.Brodie/former  Reason for Consultation:  Anemia,heme + stool  HPI: Tricia Gonzales is a 75 y.o. female   who was admitted on 08/06/2015 with complaints of chest pain and shortness of breath. She has been diagnosed with a pleuro pericarditis and is being treated with steroids. She is improved symptomatically but still feels short of breath and states she has a "smothery" feeling if she tries to lay back. She is most comfortable sitting forward. Patient has multiple medical issues including morbid obesity, adult-onset diabetes mellitus, Sjogren syndrome, hypertension, chronic kidney disease stage III, she is status post pacemaker placement.  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 She had colonoscopy last in July 2013 with finding of prior segmental colectomy and moderate pandiverticulosis, no polyps. Patient says she had a very remote EGD with Dr. Olevia Perches. She does have history of chronic GERD and stays on Protonix at home. She says she does fine on the protonix but if she stops that she gets recurrent burning in her stomach.  She's been on a baby aspirin at home and trental Patient has a chronic anemia which she says she's had for many years with baseline hemoglobin around 8. On admission, 08/07/2015 hemoglobin was down to 6.3 hematocrit of 21.3. She was transfused and on 2/16 hemoglobin was 8.6, today hemoglobin 8 hematocrit of 26.5. Stool has been documented to be Hemoccult-positive.   Patient has not seen any obvious melena or blood. She denies any abdominal pain or changes in her bowel habits.   Past Medical History  Diagnosis Date  . Hypertension   . Obesity   . Sjogren's disease ([place])   . Hyperlipidemia   . Anemia   . Diverticulosis   . Intestinal obstruction ([place])    . Hiatal hernia   . Esophageal stricture   . Vitamin B12 deficiency   . Presence of permanent cardiac pacemaker   . Type II diabetes mellitus ([place])   . History of blood transfusion     "@ least w/1st knee OR"  . Osteoarthritis   . Arthritis   . Chronic back pain   . History of gout   . Anxiety   . Depression   . Kidney stones   . Chronic kidney disease (CKD), stage III (moderate)     Past Surgical History  Procedure Laterality Date  . Joint replacement    . Colectomy      for rectovaginal fistula  . Colonoscopy  01/07/2012    Procedure: COLONOSCOPY;  Surgeon: Lafayette Dragon, MD;  Location: [facility];  Service: Endoscopy;  Laterality: N/A;  . Ep implantable device N/A 06/18/2015    Procedure: Pacemaker Implant;  Surgeon: Evans Lance, MD;  Location: [facility] [facility];  Service: Cardiovascular;  Laterality: N/A;  . Cholecystectomy open    . Insert / replace / remove pacemaker    . Total knee arthroplasty Bilateral   . Revision total knee arthroplasty Left   . Abdominal hysterectomy    . Knee arthroscopy Right   . Esophagogastroduodenoscopy (egd) with esophageal dilation      Prior to Admission medications   Medication Sig Start Date End Date Taking? Authorizing Provider  acetaminophen (TYLENOL) 500 MG tablet Take 1,500 mg by mouth at bedtime.    Yes Historical Provider, MD  aspirin EC 81 MG tablet Take 81 mg  by mouth daily.   Yes Historical Provider, MD  cholecalciferol (VITAMIN D) 1000 UNITS tablet Take 2,000 Units by mouth at bedtime.   Yes Historical Provider, MD  denosumab (PROLIA) 60 MG/ML SOLN injection Inject 60 mg into the skin every 6 (six) months. Administer in upper arm, thigh, or abdomen   Yes Historical Provider, MD  docusate sodium (COLACE) 100 MG capsule Take 100 mg by mouth every other day.   Yes Historical Provider, MD  doxazosin (CARDURA) 4 MG tablet Take 4 mg by mouth daily.   Yes Historical Provider, MD  furosemide (LASIX) 40 MG tablet Take 40 mg by  mouth daily.   Yes Historical Provider, MD  gabapentin (NEURONTIN) 300 MG capsule Take 300 mg by mouth 2 (two) times daily.    Yes Historical Provider, MD  glipiZIDE (GLUCOTROL XL) 10 MG 24 hr tablet Take 10 mg by mouth 2 (two) times daily.   Yes Historical Provider, MD  HYDROcodone-acetaminophen (NORCO/VICODIN) 5-325 MG tablet Take 1 tablet by mouth every 6 (six) hours as needed for moderate pain.   Yes Historical Provider, MD  hydroxychloroquine (PLAQUENIL) 200 MG tablet Take 400 mg by mouth daily.  03/16/15  Yes Historical Provider, MD  irbesartan (AVAPRO) 300 MG tablet Take 300 mg by mouth daily.   Yes Historical Provider, MD  montelukast (SINGULAIR) 10 MG tablet Take 10 mg by mouth at bedtime.    Yes Historical Provider, MD  Multiple Vitamin (MULITIVITAMIN WITH MINERALS) TABS Take 1 tablet by mouth daily.   Yes Historical Provider, MD  pantoprazole (PROTONIX) 40 MG tablet Take 40 mg by mouth daily.   Yes Historical Provider, MD  pentoxifylline (TRENTAL) 400 MG CR tablet Take 400 mg by mouth daily.   Yes Historical Provider, MD  pravastatin (PRAVACHOL) 40 MG tablet Take 40 mg by mouth daily.  03/16/15  Yes Historical Provider, MD  sitaGLIPtin (JANUVIA) 50 MG tablet Take 50 mg by mouth daily.   Yes Historical Provider, MD  ULORIC 40 MG tablet Take 40 mg by mouth daily.  03/15/15  Yes Historical Provider, MD  ACCU-CHEK AVIVA PLUS test strip 1 each by Other route daily.  03/15/15   Historical Provider, MD    Current Facility-Administered Medications  Medication Dose Route Frequency Provider Last Rate Last Dose  . acetaminophen (TYLENOL) tablet 650 mg  650 mg Oral Q4H PRN Rise Patience, MD   650 mg at 08/09/15 1601  . alum & mag hydroxide-simeth (MAALOX/MYLANTA) 200-200-20 MG/5ML suspension 15 mL  15 mL Oral Q6H PRN Verlee Monte, MD   15 mL at 08/08/15 1634  . aspirin EC tablet 325 mg  325 mg Oral Daily Rise Patience, MD   325 mg at 08/10/15 0945  . azithromycin (ZITHROMAX) 500 mg in  dextrose 5 % 250 mL IVPB  500 mg Intravenous Q24H Verlee Monte, MD   500 mg at 08/09/15 1425  . cefTRIAXone (ROCEPHIN) 1 g in dextrose 5 % 50 mL IVPB  1 g Intravenous Q24H Verlee Monte, MD   1 g at 08/09/15 1330  . docusate sodium (COLACE) capsule 100 mg  100 mg Oral QODAY Rise Patience, MD   100 mg at 08/10/15 0945  . febuxostat (ULORIC) tablet 40 mg  40 mg Oral Daily Rise Patience, MD   40 mg at 08/10/15 0945  . gabapentin (NEURONTIN) capsule 300 mg  300 mg Oral BID Rise Patience, MD   300 mg at 08/10/15 0945  . guaiFENesin (MUCINEX) 12 hr  tablet 1,200 mg  1,200 mg Oral BID Verlee Monte, MD   1,200 mg at 08/10/15 0945  . guaiFENesin-dextromethorphan (ROBITUSSIN DM) 100-10 MG/5ML syrup 5 mL  5 mL Oral Q4H PRN Verlee Monte, MD      . hydrALAZINE (APRESOLINE) injection 10 mg  10 mg Intravenous Q4H PRN Rise Patience, MD      . HYDROcodone-acetaminophen (NORCO/VICODIN) 5-325 MG per tablet 1 tablet  1 tablet Oral Q6H PRN Verlee Monte, MD   1 tablet at 08/08/15 1633  . HYDROmorphone (DILAUDID) injection 0.5 mg  0.5 mg Intravenous Q3H PRN Verlee Monte, MD   0.5 mg at 08/08/15 0500  . hydroxychloroquine (PLAQUENIL) tablet 400 mg  400 mg Oral Daily Rise Patience, MD   400 mg at 08/10/15 0945  . insulin aspart (novoLOG) injection 0-5 Units  0-5 Units Subcutaneous QHS Pollie Friar, PA-C   2 Units at 08/08/15 2224  . insulin aspart (novoLOG) injection 0-9 Units  0-9 Units Subcutaneous TID WC Verlee Monte, MD   5 Units at 08/10/15 1202  . linagliptin (TRADJENTA) tablet 5 mg  5 mg Oral Daily Rise Patience, MD   5 mg at 08/10/15 0945  . montelukast (SINGULAIR) tablet 10 mg  10 mg Oral QHS Rise Patience, MD   10 mg at 08/09/15 2116  . ondansetron (ZOFRAN) injection 4 mg  4 mg Intravenous Q6H PRN Rise Patience, MD      . pantoprazole (PROTONIX) EC tablet 40 mg  40 mg Oral Daily Rise Patience, MD   40 mg at 08/10/15 0945  . pentoxifylline (TRENTAL) CR tablet  400 mg  400 mg Oral Daily Rise Patience, MD   400 mg at 08/10/15 0945  . polyethylene glycol (MIRALAX / GLYCOLAX) packet 17 g  17 g Oral Daily Verlee Monte, MD   17 g at 08/09/15 1331  . pravastatin (PRAVACHOL) tablet 40 mg  40 mg Oral Daily Rise Patience, MD   40 mg at 08/10/15 0945  . predniSONE (DELTASONE) tablet 20 mg  20 mg Oral QAC breakfast Sanda Klein, MD   20 mg at 08/10/15 0608  . technetium TC 77M diethylenetriame-pentaacetic acid (DTPA) injection 31.8 milli Curie  31.8 milli Curie Intravenous Once PRN Merryl Hacker, MD        Allergies as of 08/06/2015 - Review Complete 08/06/2015  Allergen Reaction Noted  . Norvasc [amlodipine besylate] Shortness Of Breath 11/22/2007  . Glucophage [metformin hydrochloride] Other (See Comments) 09/15/2011  . Lisinopril  09/15/2011  . Losartan  09/15/2011  . Metoprolol Other (See Comments) 03/22/2015  . Morphine and related Other (See Comments) 09/15/2011  . Simvastatin Other (See Comments) 03/22/2015  . Trulicity [dulaglutide] Other (See Comments) 03/22/2015    Family History  Problem Relation Age of Onset  . Diabetes Mother   . Diabetes Brother   . Colon cancer Neg Hx   . Hypertension Sister     Social History   Social History  . Marital Status: Divorced    Spouse Name: N/A  . Number of Children: 2  . Years of Education: N/A   Occupational History  . Not on file.   Social History Main Topics  . Smoking status: Former Smoker -- 2.00 packs/day for 3 years    Types: Cigarettes    Quit date: 06/23/1983  . Smokeless tobacco: Never Used  . Alcohol Use: 1.2 oz/week    2 Glasses of wine per week  . Drug Use: No  .  Sexual Activity: No   Other Topics Concern  . Not on file   Social History Narrative    Review of Systems: Pertinent positive and negative review of systems were noted in the above HPI section.  All other review of systems was otherwise negative.n.  Physical Exam: Vital signs in last 24  hours: Temp:  [97.9 F (36.6 C)-98.2 F (36.8 C)] 98.2 F (36.8 C) (02/18 0526) Pulse Rate:  [67-72] 72 (02/18 0526) Resp:  [18-19] 18 (02/18 0526) BP: (139-146)/(44-74) 146/64 mmHg (02/18 0526) SpO2:  [99 %-100 %] 100 % (02/18 0526) Last BM Date: 08/09/15 General:   Alert,  Well-developed,older WF, obese,well-nourished, pleasant and cooperative in NAD Head:  Normocephalic and atraumatic. Eyes:  Sclera clear, no icterus.   Conjunctiva pink. Ears:  Normal auditory acuity. Nose:  No deformity, discharge,  or lesions. Mouth:  No deformity or lesions.   Neck:  Supple; no masses or thyromegaly. Lungs:  Fine rales left base and mid. Heart:  Regular rate and rhythm; no murmurs, clicks, rubs,  or gallops. Abdomen:  Soft,obese,nontender, BS active,nonpalp mass or hsm.   Rectal:  Deferred -documented heme+ Msk:  Symmetrical without gross deformities. . Pulses:  Normal pulses noted. Extremities:  Without clubbing or edema. Neurologic:  Alert and  oriented x4;  grossly normal neurologically. Skin:  Intact without significant lesions or rashes.. Psych:  Alert and cooperative. Normal mood and affect.  Intake/Output from previous day: 02/17 0701 - 02/18 0700 In: 7 [P.O.:820] Out: 750 [Urine:750] Intake/Output this shift:    Lab Results:  Recent Labs  08/08/15 0528 08/09/15 0320 08/10/15 0455  WBC 14.5* 9.5 8.0  HGB 8.6* 7.9* 8.0*  HCT 28.4* 25.8* 26.5*  PLT 262 268 280   BMET  Recent Labs  08/08/15 0528 08/09/15 0320 08/10/15 0455  NA 138 141 141  K 4.8 5.1 5.2*  CL 107 109 111  CO2 21* 24 24  GLUCOSE 279* 179* 221*  BUN 69* 71* 68*  CREATININE 2.08* 1.84* 1.62*  CALCIUM 7.4* 7.5* 7.7*   LFT No results for input(s): PROT, ALBUMIN, AST, ALT, ALKPHOS, BILITOT, BILIDIR, IBILI in the last 72 hours. PT/INR No results for input(s): LABPROT, INR in the last 72 hours. Hepatitis Panel No results for input(s): HEPBSAG, HCVAB, HEPAIGM, HEPBIGM in the last 72  hours.    IMPRESSION:   #54 75 year old female with chronic anemia, baseline hemoglobin around 8 felt secondary to anemia of chronic disease/chronic kidney disease with drop in hemoglobin on admission and Hemoccult-positive stool. Patient has not had any overt bleeding. Colonoscopy in 2013 negative with the exception of moderate pandiverticulosis. Consider upper GI source/gastropathy with complaints of chronic epigastric burning #2  Acute pericarditis-still dyspneic #3 sjogrens syndrome #4 AODM #5 morbid obesity #6 CKD stage 3 #7 HTN #7 s/p pacer- junctional bradycardia #8 s/p segmental colectomy 2011- diverticular disease- pandiverticulosis  PLAN: Pt not ready for EGD at present, consider EGD prior to discharge when closer to baseline from cardiopulmonary standpoint Monitor serial hgb's Continue daily PPI     Kalima Saylor  08/10/2015, 12:23 PM

## 2015-08-10 NOTE — Progress Notes (Signed)
Preliminary results by tech - Venous Duplex Upper Ext. Completed. Negative for deep vein thrombosis in both arms and superficial vein thrombosis in the right arm.  Positive for a localized thrombus in the left basilic vein at the antecubital fossa. Results given to patient's nurse.  Oda Cogan, BS, RDMS, RVT

## 2015-08-10 NOTE — Progress Notes (Addendum)
[facility] PROGRESS NOTE   Tricia Gonzales YQM:[id number] DOB: [birth year] DOA: 08/06/2015 PCP: Reginia Naas, MD  HPI/Subjective: Feels better today, still not back to her baseline. PT recommended SNF, patient reluctant to go, set up home health services likely for discharge to home in a.m.  Assessment/Plan: Principal Problem:   Chest pain Active Problems:   Pacemaker   Diabetes mellitus type 2, controlled (HCC)   Hyperlipidemia   Hypertension   CKD (chronic kidney disease) stage 3, GFR 30-59 ml/min   Chronic anemia   Acute pericarditis   UTI (urinary tract infection)   Chest pain Pleuritic, has positive d-dimer is, VQ scan is negative for PEs. Patient also has -3 sets of cardiac enzymes and 12-lead EKG. 2-D echo showed no evidence of pericardial effusion, normal LVEF. Per cardiology this is likely acute pleuropericarditis, improving with a steroids.  Community acquired pneumonia, presumed Patient came in with chest pain and fever, not much of SOB and sputum production. CT scan of the chest showed atelectasis versus infiltrates. Because of patient fever, leukocytosis and increased procalcitonin, I well start her on antibiotics for pneumonia. Supportive management with mucolytics, bronchial dilators, antitussives and oxygen as needed. Patient is doing much better on antibiotics and steroids. We'll continue.  Anemia Severe anemia with hemoglobin of 6.3, hemoglobin baseline is 8.2 from December 2016. This acute anemia on top of anemia of chronic disease secondary to CKD stage III, no evidence of bleeding. Patient required transfusion of 2 units of packed RBCs, FOBT was positive from last night. GI to evaluate.  UTI Urinalysis is consistent with UTI, on Rocephin. Culture growing Escherichia coli, adjust antibiotics according to culture results.  Hypotension Unclear etiology, could be secondary to diuretics and opioid narcotics. Blood pressure is reasonable  today, continue to follow closely.  History of second-degree AV block Permanent pacemaker in place, interrogated by cardiology.  Acute renal failure and chronic kidney disease stage III. Patient has chronic disease with creatinine baseline of 1.8, presented with creatinine of 2.26. Creatinine is back to baseline of 1.6. Slight hyperkalemia of 5.2, check BMP in a.m.   Code Status: Full Code Family Communication: Plan discussed with the patient. Disposition Plan: Remains inpatient Diet: Diet renal/carb modified with fluid restriction Diet-HS Snack?: Nothing; Room service appropriate?: Yes; Fluid consistency:: Thin  Consultants:  Cards  Procedures:  None  Antibiotics:  None   Objective: Filed Vitals:   08/09/15 2015 08/10/15 0526  BP: 139/44 146/64  Pulse: 67 72  Temp: 98.2 F (36.8 C) 98.2 F (36.8 C)  Resp: 18 18    Intake/Output Summary (Last 24 hours) at 08/10/15 1120 Last data filed at 08/09/15 1901  Gross per 24 hour  Intake    480 ml  Output    600 ml  Net   -120 ml   Filed Weights   08/06/15 0500 08/09/15 0534  Weight: 113.2 kg (249 lb 9 oz) 122.5 kg (270 lb 1 oz)    Exam: General: Alert and awake, oriented x3, not in any acute distress. HEENT: anicteric sclera, pupils reactive to light and accommodation, EOMI CVS: S1-S2 clear, no murmur rubs or gallops Chest: clear to auscultation bilaterally, no wheezing, rales or rhonchi Abdomen: soft nontender, nondistended, normal bowel sounds, no organomegaly Extremities: no cyanosis, clubbing or edema noted bilaterally Neuro: Cranial nerves II-XII intact, no focal neurological deficits  Data Reviewed: Basic Metabolic Panel:  Recent Labs Lab 08/06/15 2104 08/07/15 1058 08/08/15 0528 08/09/15 0320 08/10/15 0455  NA 136 141 138 141 141  K 5.2* 4.8 4.8 5.1 5.2*  CL 104 110 107 109 111  CO2 20* 21* 21* 24 24  GLUCOSE 271* 158* 279* 179* 221*  BUN 71* 66* 69* 71* 68*  CREATININE 2.28* 2.23* 2.08* 1.84*  1.62*  CALCIUM 7.8* 7.3* 7.4* 7.5* 7.7*   Liver Function Tests:  Recent Labs Lab 08/06/15 0146  AST 16  ALT 12*  ALKPHOS 57  BILITOT 0.6  PROT 6.2*  ALBUMIN 3.3*    Recent Labs Lab 08/06/15 0146  LIPASE 25   No results for input(s): AMMONIA in the last 168 hours. CBC:  Recent Labs Lab 08/06/15 0146 08/06/15 1321 08/06/15 2104 08/07/15 0500 08/07/15 1701 08/08/15 0528 08/09/15 0320 08/10/15 0455  WBC 11.1* 21.1* 21.4* 15.7* 16.7* 14.5* 9.5 8.0  NEUTROABS 9.7* 19.5* 19.3*  --   --   --   --   --   HGB 7.8* 7.3* 7.1* 6.3* 8.8* 8.6* 7.9* 8.0*  HCT 26.1* 25.0* 23.9* 21.3* 27.3* 28.4* 25.8* 26.5*  MCV 86.4 87.1 87.5 88.0 87.5 87.4 87.2 88.0  PLT 276 282 278 250 245 262 268 280   Cardiac Enzymes:  Recent Labs Lab 08/06/15 1321 08/06/15 1647 08/06/15 2356  TROPONINI <0.03 0.04* 0.04*   BNP (last 3 results)  Recent Labs  08/06/15 0146  BNP 233.6*    ProBNP (last 3 results) No results for input(s): PROBNP in the last 8760 hours.  CBG:  Recent Labs Lab 08/09/15 0556 08/09/15 1114 08/09/15 1632 08/09/15 2118 08/10/15 0559  GLUCAP 156* 311* 272* 188* 198*    Micro Recent Results (from the past 240 hour(s))  MRSA PCR Screening     Status: None   Collection Time: 08/06/15 11:24 AM  Result Value Ref Range Status   MRSA by PCR NEGATIVE NEGATIVE Final    Comment:        The GeneXpert MRSA Assay (FDA approved for NASAL specimens only), is one component of a comprehensive MRSA colonization surveillance program. It is not intended to diagnose MRSA infection nor to guide or monitor treatment for MRSA infections.   Culture, blood (routine x 2)     Status: None (Preliminary result)   Collection Time: 08/06/15  8:46 PM  Result Value Ref Range Status   Specimen Description BLOOD LEFT HAND  Final   Special Requests   Final    BOTTLES DRAWN AEROBIC AND ANAEROBIC 4.5CC BLUE 5CC RED   Culture NO GROWTH 3 DAYS  Final   Report Status PENDING   Incomplete  Culture, Urine     Status: None (Preliminary result)   Collection Time: 08/06/15  9:37 PM  Result Value Ref Range Status   Specimen Description URINE, CLEAN CATCH  Final   Special Requests NONE  Final   Culture >=100,000 COLONIES/mL ESCHERICHIA COLI  Final   Report Status PENDING  Incomplete  Culture, blood (routine x 2)     Status: None (Preliminary result)   Collection Time: 08/06/15 11:56 PM  Result Value Ref Range Status   Specimen Description BLOOD LEFT HAND  Final   Special Requests IN PEDIATRIC BOTTLE 1CC  Final   Culture NO GROWTH 2 DAYS  Final   Report Status PENDING  Incomplete     Studies: No results found.  Scheduled Meds: . aspirin EC  325 mg Oral Daily  . azithromycin  500 mg Intravenous Q24H  . cefTRIAXone (ROCEPHIN)  IV  1 g Intravenous Q24H  . docusate sodium  100 mg Oral QODAY  . febuxostat  40 mg Oral Daily  . gabapentin  300 mg Oral BID  . guaiFENesin  1,200 mg Oral BID  . hydroxychloroquine  400 mg Oral Daily  . insulin aspart  0-5 Units Subcutaneous QHS  . insulin aspart  0-9 Units Subcutaneous TID WC  . linagliptin  5 mg Oral Daily  . montelukast  10 mg Oral QHS  . pantoprazole  40 mg Oral Daily  . pentoxifylline  400 mg Oral Daily  . polyethylene glycol  17 g Oral Daily  . pravastatin  40 mg Oral Daily  . predniSONE  20 mg Oral QAC breakfast   Continuous Infusions:       Time spent: 35 minutes    [facility]  [facility] Pager [phone] If 7PM-7AM, please contact night-coverage at [email or web address], password [facility] 08/10/2015, 11:20 AM  LOS: 2 days

## 2015-08-11 LAB — CBC
HCT: 26.4 % — ABNORMAL LOW (ref 36.0–46.0)
Hemoglobin: 7.9 g/dL — ABNORMAL LOW (ref 12.0–15.0)
MCH: 26.5 pg (ref 26.0–34.0)
MCHC: 29.9 g/dL — ABNORMAL LOW (ref 30.0–36.0)
MCV: 88.6 fL (ref 78.0–100.0)
Platelets: 292 10*3/uL (ref 150–400)
RBC: 2.98 MIL/uL — ABNORMAL LOW (ref 3.87–5.11)
RDW: 16.1 % — ABNORMAL HIGH (ref 11.5–15.5)
WBC: 6.6 10*3/uL (ref 4.0–10.5)

## 2015-08-11 LAB — CULTURE, BLOOD (ROUTINE X 2): Culture: NO GROWTH

## 2015-08-11 LAB — BASIC METABOLIC PANEL
Anion gap: 7 (ref 5–15)
BUN: 58 mg/dL — ABNORMAL HIGH (ref 6–20)
CO2: 21 mmol/L — ABNORMAL LOW (ref 22–32)
Calcium: 7.9 mg/dL — ABNORMAL LOW (ref 8.9–10.3)
Chloride: 112 mmol/L — ABNORMAL HIGH (ref 101–111)
Creatinine, Ser: 1.33 mg/dL — ABNORMAL HIGH (ref 0.44–1.00)
GFR calc Af Amer: 44 mL/min — ABNORMAL LOW (ref >60)
GFR calc non Af Amer: 38 mL/min — ABNORMAL LOW (ref >60)
Glucose, Bld: 182 mg/dL — ABNORMAL HIGH (ref 65–99)
Potassium: 5.3 mmol/L — ABNORMAL HIGH (ref 3.5–5.1)
Sodium: 140 mmol/L (ref 135–145)

## 2015-08-11 LAB — GLUCOSE, CAPILLARY
Glucose-Capillary: 165 mg/dL — ABNORMAL HIGH (ref 65–99)
Glucose-Capillary: 255 mg/dL — ABNORMAL HIGH (ref 65–99)
Glucose-Capillary: 175 mg/dL — ABNORMAL HIGH (ref 65–99)

## 2015-08-11 MED ORDER — FUROSEMIDE 10 MG/ML IJ SOLN
40.0000 mg | Freq: Three times a day (TID) | INTRAMUSCULAR | Status: AC
  Administered 2015-08-11 (×2): 40 mg via INTRAVENOUS
  Filled 2015-08-11 (×2): qty 4

## 2015-08-11 NOTE — Progress Notes (Signed)
Patient ID: Tricia Gonzales, female   DOB: [birth year], 75 y.o.   MRN: [id number]    Progress Note   Subjective  Up walking in room. Feels tired, fatigued  Denies SOB Has not required O2 and sats in mid 90's   Objective   Vital signs in last 24 hours: Temp:  [97.5 F (36.4 C)-98.1 F (36.7 C)] 98.1 F (36.7 C) (02/19 0549) Pulse Rate:  [65-71] 70 (02/19 0549) Resp:  [17-18] 18 (02/19 0549) BP: (142-179)/(56-79) 147/56 mmHg (02/19 0549) SpO2:  [96 %-100 %] 96 % (02/19 0549) Last BM Date: 08/09/15 General: obese    white female in NAD Heart:  Regular rate and rhythm; no murmurs Lungs: Respirations even and unlabored, lungs  Few fines rales Abdomen:  Soft,obese, nontender and nondistended. Normal bowel sounds. Extremities:  Without edema. Neurologic:  Alert and oriented,  grossly normal neurologically. Psych:  Cooperative. Normal mood and affect.  Intake/Output from previous day: 02/18 0701 - 02/19 0700 In: 600 [P.O.:600] Out: -  Intake/Output this shift:    Lab Results:  Recent Labs  08/09/15 0320 08/10/15 0455 08/11/15 0248  WBC 9.5 8.0 6.6  HGB 7.9* 8.0* 7.9*  HCT 25.8* 26.5* 26.4*  PLT 268 280 292   BMET  Recent Labs  08/09/15 0320 08/10/15 0455 08/11/15 0248  NA 141 141 140  K 5.1 5.2* 5.3*  CL 109 111 112*  CO2 24 24 21*  GLUCOSE 179* 221* 182*  BUN 71* 68* 58*  CREATININE 1.84* 1.62* 1.33*  CALCIUM 7.5* 7.7* 7.9*   LFT No results for input(s): PROT, ALBUMIN, AST, ALT, ALKPHOS, BILITOT, BILIDIR, IBILI in the last 72 hours. PT/INR No results for input(s): LABPROT, INR in the last 72 hours.  Studies/Results: No results found.     Assessment / Plan:     #1  75 yo female with chronic anemia(CKD) , with recent drop and heme + stool  Colon 2013 with diverticulosis only On chronic PPI for GERd, c/o burning in stomach Will proceed with EGD tomorrow with Dr Havery [name]- believe best for her to be done prior to Discharge, as would need to be done at  Hospital due to BMI etc NPO in am for EGD HGB stable- repeat in am  #2 Pericarditis- on steroids #3 morbid obesity #4 Sjogren's #5 AODM #6 pacemaker- junctional brady #7 chronic antiplatelet rx- on ASA and trental       Principal Problem:   Chest pain Active Problems:   Pacemaker   Diabetes mellitus type 2, controlled ([place])   Hyperlipidemia   Hypertension   CKD (chronic kidney disease) stage 3, GFR 30-59 ml/min   Chronic anemia   Acute pericarditis   UTI (urinary tract infection)   Chest pain at rest   Heme positive stool     LOS: 3 days   Amy Esterwood  08/11/2015, 9:14 AM

## 2015-08-11 NOTE — Progress Notes (Signed)
[facility] PROGRESS NOTE   Tricia Gonzales BHA:[id number] DOB: Feb 25, 1941 DOA: 08/06/2015 PCP: Reginia Naas, MD  HPI/Subjective: Seen by GI, recommended EGD to be done in a.m. Continued to complain about lower extremity edema, Lasix restarted.  Assessment/Plan: Principal Problem:   Chest pain Active Problems:   Pacemaker   Diabetes mellitus type 2, controlled (HCC)   Hyperlipidemia   Hypertension   CKD (chronic kidney disease) stage 3, GFR 30-59 ml/min   Chronic anemia   Acute pericarditis   UTI (urinary tract infection)   Chest pain at rest   Heme positive stool   Chest pain Pleuritic, has positive d-dimer is, VQ scan is negative for PEs. Patient also has -3 sets of cardiac enzymes and 12-lead EKG. 2-D echo showed no evidence of pericardial effusion, normal LVEF. Per cardiology this is likely acute pleuropericarditis, improving with a steroids.  Community acquired pneumonia, presumed Patient came in with chest pain and fever, not much of SOB and sputum production. CT scan of the chest showed atelectasis versus infiltrates. Because of patient fever, leukocytosis and increased procalcitonin, I well start her on antibiotics for pneumonia. Supportive management with mucolytics, bronchial dilators, antitussives and oxygen as needed. Patient is doing much better on antibiotics and steroids. We'll continue.  Volume overload Patient has CKD stage III, been on Lasix at home, Lasix held at the time of admission. Patient received transfusion and IV fluids, continue with volume overload. Started on Lasix, 2-D echo showed LVEF of 60-65%, does not have history of chronic CHF.  Anemia Severe anemia with hemoglobin of 6.3, hemoglobin baseline is 8.2 from December 2016. This acute anemia on top of anemia of chronic disease secondary to CKD stage III. Patient required transfusion of 2 units of packed RBCs, FOBT was positive from last night. GI to evaluate.    UTI Urinalysis is consistent with UTI, on Rocephin. Culture growing Escherichia coli, adjust antibiotics according to culture results.  Hypotension Unclear etiology, could be secondary to diuretics and opioid narcotics. Blood pressure is reasonable today, continue to follow closely.  History of second-degree AV block Permanent pacemaker in place, interrogated by cardiology.  Acute renal failure and chronic kidney disease stage III. Patient has chronic disease with creatinine baseline of 1.8, presented with creatinine of 2.26. Creatinine is back to baseline of 1.6. Slight hyperkalemia of 5.2, check BMP in a.m.   Code Status: Full Code Family Communication: Plan discussed with the patient. Disposition Plan: Remains inpatient Diet: Diet renal/carb modified with fluid restriction Diet-HS Snack?: Nothing; Room service appropriate?: Yes; Fluid consistency:: Thin Diet NPO time specified  Consultants:  Cards  Procedures:  None  Antibiotics:  None   Objective: Filed Vitals:   08/11/15 0549 08/11/15 1349  BP: 147/56 155/60  Pulse: 70 75  Temp: 98.1 F (36.7 C) 97.9 F (36.6 C)  Resp: 18 18    Intake/Output Summary (Last 24 hours) at 08/11/15 1403 Last data filed at 08/11/15 1349  Gross per 24 hour  Intake    600 ml  Output   2200 ml  Net  -1600 ml   Filed Weights   08/06/15 0500 08/09/15 0534  Weight: 113.2 kg (249 lb 9 oz) 122.5 kg (270 lb 1 oz)    Exam: General: Alert and awake, oriented x3, not in any acute distress. HEENT: anicteric sclera, pupils reactive to light and accommodation, EOMI CVS: S1-S2 clear, no murmur rubs or gallops Chest: clear to auscultation bilaterally, no wheezing, rales or rhonchi Abdomen: soft nontender, nondistended, normal bowel  sounds, no organomegaly Extremities: no cyanosis, clubbing or edema noted bilaterally Neuro: Cranial nerves II-XII intact, no focal neurological deficits  Data Reviewed: Basic Metabolic Panel:  Recent  Labs Lab 08/07/15 1058 08/08/15 0528 08/09/15 0320 08/10/15 0455 08/11/15 0248  NA 141 138 141 141 140  K 4.8 4.8 5.1 5.2* 5.3*  CL 110 107 109 111 112*  CO2 21* 21* 24 24 21*  GLUCOSE 158* 279* 179* 221* 182*  BUN 66* 69* 71* 68* 58*  CREATININE 2.23* 2.08* 1.84* 1.62* 1.33*  CALCIUM 7.3* 7.4* 7.5* 7.7* 7.9*   Liver Function Tests:  Recent Labs Lab 08/06/15 0146  AST 16  ALT 12*  ALKPHOS 57  BILITOT 0.6  PROT 6.2*  ALBUMIN 3.3*    Recent Labs Lab 08/06/15 0146  LIPASE 25   No results for input(s): AMMONIA in the last 168 hours. CBC:  Recent Labs Lab 08/06/15 0146 08/06/15 1321 08/06/15 2104  08/07/15 1701 08/08/15 0528 08/09/15 0320 08/10/15 0455 08/11/15 0248  WBC 11.1* 21.1* 21.4*  < > 16.7* 14.5* 9.5 8.0 6.6  NEUTROABS 9.7* 19.5* 19.3*  --   --   --   --   --   --   HGB 7.8* 7.3* 7.1*  < > 8.8* 8.6* 7.9* 8.0* 7.9*  HCT 26.1* 25.0* 23.9*  < > 27.3* 28.4* 25.8* 26.5* 26.4*  MCV 86.4 87.1 87.5  < > 87.5 87.4 87.2 88.0 88.6  PLT 276 282 278  < > 245 262 268 280 292  < > = values in this interval not displayed. Cardiac Enzymes:  Recent Labs Lab 08/06/15 1321 08/06/15 1647 08/06/15 2356  TROPONINI <0.03 0.04* 0.04*   BNP (last 3 results)  Recent Labs  08/06/15 0146  BNP 233.6*    ProBNP (last 3 results) No results for input(s): PROBNP in the last 8760 hours.  CBG:  Recent Labs Lab 08/10/15 1123 08/10/15 1632 08/10/15 2143 08/11/15 0548 08/11/15 1053  GLUCAP 280* 352* 228* 165* 255*    Micro Recent Results (from the past 240 hour(s))  MRSA PCR Screening     Status: None   Collection Time: 08/06/15 11:24 AM  Result Value Ref Range Status   MRSA by PCR NEGATIVE NEGATIVE Final    Comment:        The GeneXpert MRSA Assay (FDA approved for NASAL specimens only), is one component of a comprehensive MRSA colonization surveillance program. It is not intended to diagnose MRSA infection nor to guide or monitor treatment for MRSA  infections.   Culture, blood (routine x 2)     Status: None (Preliminary result)   Collection Time: 08/06/15  8:46 PM  Result Value Ref Range Status   Specimen Description BLOOD LEFT HAND  Final   Special Requests   Final    BOTTLES DRAWN AEROBIC AND ANAEROBIC 4.5CC BLUE 5CC RED   Culture NO GROWTH 4 DAYS  Final   Report Status PENDING  Incomplete  Culture, Urine     Status: None   Collection Time: 08/06/15  9:37 PM  Result Value Ref Range Status   Specimen Description URINE, CLEAN CATCH  Final   Special Requests NONE  Final   Culture >=100,000 COLONIES/mL ESCHERICHIA COLI  Final   Report Status 08/10/2015 FINAL  Final   Organism ID, Bacteria ESCHERICHIA COLI  Final      Susceptibility   Escherichia coli - MIC*    AMPICILLIN 4 SENSITIVE Sensitive     CEFAZOLIN <=4 SENSITIVE Sensitive  CEFTRIAXONE <=1 SENSITIVE Sensitive     CIPROFLOXACIN <=0.25 SENSITIVE Sensitive     GENTAMICIN <=1 SENSITIVE Sensitive     IMIPENEM <=0.25 SENSITIVE Sensitive     NITROFURANTOIN <=16 SENSITIVE Sensitive     TRIMETH/SULFA <=20 SENSITIVE Sensitive     AMPICILLIN/SULBACTAM 4 SENSITIVE Sensitive     PIP/TAZO <=4 SENSITIVE Sensitive     * >=100,000 COLONIES/mL ESCHERICHIA COLI  Culture, blood (routine x 2)     Status: None (Preliminary result)   Collection Time: 08/06/15 11:56 PM  Result Value Ref Range Status   Specimen Description BLOOD LEFT HAND  Final   Special Requests IN PEDIATRIC BOTTLE 1CC  Final   Culture NO GROWTH 3 DAYS  Final   Report Status PENDING  Incomplete     Studies: No results found.  Scheduled Meds: . aspirin EC  325 mg Oral Daily  . azithromycin  500 mg Intravenous Q24H  . cefTRIAXone (ROCEPHIN)  IV  1 g Intravenous Q24H  . docusate sodium  100 mg Oral QODAY  . febuxostat  40 mg Oral Daily  . gabapentin  300 mg Oral BID  . guaiFENesin  1,200 mg Oral BID  . hydroxychloroquine  400 mg Oral Daily  . insulin aspart  0-5 Units Subcutaneous QHS  . insulin aspart  0-9  Units Subcutaneous TID WC  . linagliptin  5 mg Oral Daily  . montelukast  10 mg Oral QHS  . pantoprazole  40 mg Oral Daily  . pentoxifylline  400 mg Oral Daily  . polyethylene glycol  17 g Oral Daily  . pravastatin  40 mg Oral Daily   Continuous Infusions:       Time spent: 35 minutes    [facility]  [facility] Pager [phone] If 7PM-7AM, please contact night-coverage at [email or web address], password [facility] 08/11/2015, 2:03 PM  LOS: 3 days

## 2015-08-12 ENCOUNTER — Encounter (HOSPITAL_COMMUNITY)
Admission: EM | Disposition: A | Discharge status: Home Health | Payer: Self-pay | Source: Home / Self Care | Attending: Internal Medicine

## 2015-08-12 ENCOUNTER — Inpatient Hospital Stay (HOSPITAL_COMMUNITY): Payer: Medicare Other | Admitting: Anesthesiology

## 2015-08-12 ENCOUNTER — Encounter (HOSPITAL_COMMUNITY): Payer: Self-pay

## 2015-08-12 DIAGNOSIS — K3189 Other diseases of stomach and duodenum: Secondary | ICD-10-CM

## 2015-08-12 HISTORY — PX: ESOPHAGOGASTRODUODENOSCOPY (EGD) WITH PROPOFOL: SHX5813

## 2015-08-12 LAB — BASIC METABOLIC PANEL
Anion gap: 9 (ref 5–15)
BUN: 54 mg/dL — ABNORMAL HIGH (ref 6–20)
CO2: 24 mmol/L (ref 22–32)
Calcium: 8.3 mg/dL — ABNORMAL LOW (ref 8.9–10.3)
Chloride: 108 mmol/L (ref 101–111)
Creatinine, Ser: 1.38 mg/dL — ABNORMAL HIGH (ref 0.44–1.00)
GFR calc Af Amer: 42 mL/min — ABNORMAL LOW (ref >60)
GFR calc non Af Amer: 37 mL/min — ABNORMAL LOW (ref >60)
Glucose, Bld: 163 mg/dL — ABNORMAL HIGH (ref 65–99)
Potassium: 5.4 mmol/L — ABNORMAL HIGH (ref 3.5–5.1)
Sodium: 141 mmol/L (ref 135–145)

## 2015-08-12 LAB — GLUCOSE, CAPILLARY
Glucose-Capillary: 321 mg/dL — ABNORMAL HIGH (ref 65–99)
Glucose-Capillary: 163 mg/dL — ABNORMAL HIGH (ref 65–99)
Glucose-Capillary: 167 mg/dL — ABNORMAL HIGH (ref 65–99)
Glucose-Capillary: 257 mg/dL — ABNORMAL HIGH (ref 65–99)
Glucose-Capillary: 187 mg/dL — ABNORMAL HIGH (ref 65–99)

## 2015-08-12 LAB — CULTURE, BLOOD (ROUTINE X 2): Culture: NO GROWTH

## 2015-08-12 SURGERY — ESOPHAGOGASTRODUODENOSCOPY (EGD) WITH PROPOFOL
Anesthesia: Monitor Anesthesia Care

## 2015-08-12 MED ORDER — PROPOFOL 500 MG/50ML IV EMUL
INTRAVENOUS | Status: DC | PRN
  Administered 2015-08-12: 100 ug/kg/min via INTRAVENOUS

## 2015-08-12 MED ORDER — FUROSEMIDE 10 MG/ML IJ SOLN
40.0000 mg | Freq: Every day | INTRAMUSCULAR | Status: DC
  Administered 2015-08-13: 40 mg via INTRAVENOUS
  Filled 2015-08-12 (×2): qty 4

## 2015-08-12 MED ORDER — PANTOPRAZOLE SODIUM 40 MG PO TBEC
40.0000 mg | DELAYED_RELEASE_TABLET | Freq: Two times a day (BID) | ORAL | Status: DC
  Administered 2015-08-12 – 2015-08-13 (×3): 40 mg via ORAL
  Filled 2015-08-12 (×2): qty 1

## 2015-08-12 MED ORDER — BUTAMBEN-TETRACAINE-BENZOCAINE 2-2-14 % EX AERO
INHALATION_SPRAY | CUTANEOUS | Status: DC | PRN
  Administered 2015-08-12: 2 via TOPICAL

## 2015-08-12 MED ORDER — SODIUM POLYSTYRENE SULFONATE 15 GM/60ML PO SUSP
15.0000 g | Freq: Once | ORAL | Status: DC
  Filled 2015-08-12: qty 60

## 2015-08-12 MED ORDER — LACTATED RINGERS IV SOLN
INTRAVENOUS | Status: DC | PRN
  Administered 2015-08-12: 11:00:00 via INTRAVENOUS

## 2015-08-12 MED ORDER — ASPIRIN EC 81 MG PO TBEC
81.0000 mg | DELAYED_RELEASE_TABLET | Freq: Every day | ORAL | Status: DC
  Administered 2015-08-13: 81 mg via ORAL
  Filled 2015-08-12: qty 1

## 2015-08-12 NOTE — Progress Notes (Signed)
[facility] PROGRESS NOTE   ROWEN HUR UYQ:[id number] DOB: December 29, 1940 DOA: 08/06/2015 PCP: Reginia Naas, MD  HPI/Subjective: Feels okay this morning, EGD done and showed erosive esophagitis. PPI to be increased to twice a day.  Assessment/Plan: Principal Problem:   Chest pain Active Problems:   Pacemaker   Diabetes mellitus type 2, controlled (HCC)   Hyperlipidemia   Hypertension   CKD (chronic kidney disease) stage 3, GFR 30-59 ml/min   Chronic anemia   Acute pericarditis   UTI (urinary tract infection)   Chest pain at rest   Heme positive stool   Chest pain Pleuritic, has positive d-dimer is, VQ scan is negative for PEs. Patient also has -3 sets of cardiac enzymes and 12-lead EKG. 2-D echo showed no evidence of pericardial effusion, normal LVEF. Per cardiology this is likely acute pleuropericarditis, improving with a steroids.  Community acquired pneumonia, presumed Patient came in with chest pain and fever, not much of SOB and sputum production. CT scan of the chest showed atelectasis versus infiltrates. Because of patient fever, leukocytosis and increased procalcitonin, I well start her on antibiotics for pneumonia. Supportive management with mucolytics, bronchial dilators, antitussives and oxygen as needed. Patient is doing much better on antibiotics and steroids. We'll continue.  Erosive esophagitis Seen by GI, PPI ordered as twice a day. Biopsies taken and serology for H. pylori On aspirin 325 mg, will decrease to 81 mg.  Volume overload Patient has CKD stage III, been on Lasix at home, Lasix held at the time of admission. Patient received transfusion and IV fluids, continue with volume overload. Started on Lasix, 2-D echo showed LVEF of 60-65%, does not have history of chronic CHF.  Anemia Severe anemia with hemoglobin of 6.3, hemoglobin baseline is 8.2 from December 2016. This acute anemia on top of anemia of chronic disease secondary to  CKD stage III. Patient required transfusion of 2 units of packed RBCs, FOBT was positive from last night. EGD done and showed erosive esophagitis.   UTI Urinalysis is consistent with UTI, on Rocephin. Culture growing Escherichia coli, adjust antibiotics according to culture results.  Hypotension Unclear etiology, could be secondary to diuretics and opioid narcotics. Blood pressure is reasonable today, continue to follow closely.  History of second-degree AV block Permanent pacemaker in place, interrogated by cardiology.  Acute renal failure and chronic kidney disease stage III. Patient has chronic disease with creatinine baseline of 1.8, presented with creatinine of 2.26. Creatinine is back to baseline of 1.6. Lasix restarted Has a slight hyperkalemia of 5.4, did not go down with Lasix, I'll give small dose of Kayexalate.   Code Status: Full Code Family Communication: Plan discussed with the patient. Disposition Plan: Remains inpatient Diet: Diet NPO time specified  Consultants:  Cards  Procedures:  None  Antibiotics:  None   Objective: Filed Vitals:   08/12/15 1035 08/12/15 1201  BP: 186/79 165/87  Pulse: 79 71  Temp: 98.8 F (37.1 C) 97.5 F (36.4 C)  Resp: 15 18    Intake/Output Summary (Last 24 hours) at 08/12/15 1218 Last data filed at 08/12/15 1200  Gross per 24 hour  Intake   1720 ml  Output   3851 ml  Net  -2131 ml   Filed Weights   08/06/15 0500 08/09/15 0534 08/12/15 1035  Weight: 113.2 kg (249 lb 9 oz) 122.5 kg (270 lb 1 oz) 122.471 kg (270 lb)    Exam: General: Alert and awake, oriented x3, not in any acute distress. HEENT: anicteric sclera,  pupils reactive to light and accommodation, EOMI CVS: S1-S2 clear, no murmur rubs or gallops Chest: clear to auscultation bilaterally, no wheezing, rales or rhonchi Abdomen: soft nontender, nondistended, normal bowel sounds, no organomegaly Extremities: no cyanosis, clubbing or edema noted  bilaterally Neuro: Cranial nerves II-XII intact, no focal neurological deficits  Data Reviewed: Basic Metabolic Panel:  Recent Labs Lab 08/08/15 0528 08/09/15 0320 08/10/15 0455 08/11/15 0248 08/12/15 0341  NA 138 141 141 140 141  K 4.8 5.1 5.2* 5.3* 5.4*  CL 107 109 111 112* 108  CO2 21* 24 24 21* 24  GLUCOSE 279* 179* 221* 182* 163*  BUN 69* 71* 68* 58* 54*  CREATININE 2.08* 1.84* 1.62* 1.33* 1.38*  CALCIUM 7.4* 7.5* 7.7* 7.9* 8.3*   Liver Function Tests:  Recent Labs Lab 08/06/15 0146  AST 16  ALT 12*  ALKPHOS 57  BILITOT 0.6  PROT 6.2*  ALBUMIN 3.3*    Recent Labs Lab 08/06/15 0146  LIPASE 25   No results for input(s): AMMONIA in the last 168 hours. CBC:  Recent Labs Lab 08/06/15 0146 08/06/15 1321 08/06/15 2104  08/07/15 1701 08/08/15 0528 08/09/15 0320 08/10/15 0455 08/11/15 0248  WBC 11.1* 21.1* 21.4*  < > 16.7* 14.5* 9.5 8.0 6.6  NEUTROABS 9.7* 19.5* 19.3*  --   --   --   --   --   --   HGB 7.8* 7.3* 7.1*  < > 8.8* 8.6* 7.9* 8.0* 7.9*  HCT 26.1* 25.0* 23.9*  < > 27.3* 28.4* 25.8* 26.5* 26.4*  MCV 86.4 87.1 87.5  < > 87.5 87.4 87.2 88.0 88.6  PLT 276 282 278  < > 245 262 268 280 292  < > = values in this interval not displayed. Cardiac Enzymes:  Recent Labs Lab 08/06/15 1321 08/06/15 1647 08/06/15 2356  TROPONINI <0.03 0.04* 0.04*   BNP (last 3 results)  Recent Labs  08/06/15 0146  BNP 233.6*    ProBNP (last 3 results) No results for input(s): PROBNP in the last 8760 hours.  CBG:  Recent Labs Lab 08/10/15 2143 08/11/15 0548 08/11/15 1053 08/11/15 2121 08/12/15 0619  GLUCAP 228* 165* 255* 175* 163*    Micro Recent Results (from the past 240 hour(s))  MRSA PCR Screening     Status: None   Collection Time: 08/06/15 11:24 AM  Result Value Ref Range Status   MRSA by PCR NEGATIVE NEGATIVE Final    Comment:        The GeneXpert MRSA Assay (FDA approved for NASAL specimens only), is one component of a comprehensive  MRSA colonization surveillance program. It is not intended to diagnose MRSA infection nor to guide or monitor treatment for MRSA infections.   Culture, blood (routine x 2)     Status: None   Collection Time: 08/06/15  8:46 PM  Result Value Ref Range Status   Specimen Description BLOOD LEFT HAND  Final   Special Requests   Final    BOTTLES DRAWN AEROBIC AND ANAEROBIC 4.5CC BLUE 5CC RED   Culture NO GROWTH 5 DAYS  Final   Report Status 08/11/2015 FINAL  Final  Culture, Urine     Status: None   Collection Time: 08/06/15  9:37 PM  Result Value Ref Range Status   Specimen Description URINE, CLEAN CATCH  Final   Special Requests NONE  Final   Culture >=100,000 COLONIES/mL ESCHERICHIA COLI  Final   Report Status 08/10/2015 FINAL  Final   Organism ID, Bacteria ESCHERICHIA COLI  Final  Susceptibility   Escherichia coli - MIC*    AMPICILLIN 4 SENSITIVE Sensitive     CEFAZOLIN <=4 SENSITIVE Sensitive     CEFTRIAXONE <=1 SENSITIVE Sensitive     CIPROFLOXACIN <=0.25 SENSITIVE Sensitive     GENTAMICIN <=1 SENSITIVE Sensitive     IMIPENEM <=0.25 SENSITIVE Sensitive     NITROFURANTOIN <=16 SENSITIVE Sensitive     TRIMETH/SULFA <=20 SENSITIVE Sensitive     AMPICILLIN/SULBACTAM 4 SENSITIVE Sensitive     PIP/TAZO <=4 SENSITIVE Sensitive     * >=100,000 COLONIES/mL ESCHERICHIA COLI  Culture, blood (routine x 2)     Status: None (Preliminary result)   Collection Time: 08/06/15 11:56 PM  Result Value Ref Range Status   Specimen Description BLOOD LEFT HAND  Final   Special Requests IN PEDIATRIC BOTTLE 1CC  Final   Culture NO GROWTH 4 DAYS  Final   Report Status PENDING  Incomplete     Studies: No results found.  Scheduled Meds: . aspirin EC  325 mg Oral Daily  . azithromycin  500 mg Intravenous Q24H  . cefTRIAXone (ROCEPHIN)  IV  1 g Intravenous Q24H  . docusate sodium  100 mg Oral QODAY  . febuxostat  40 mg Oral Daily  . gabapentin  300 mg Oral BID  . guaiFENesin  1,200 mg Oral  BID  . hydroxychloroquine  400 mg Oral Daily  . insulin aspart  0-5 Units Subcutaneous QHS  . insulin aspart  0-9 Units Subcutaneous TID WC  . linagliptin  5 mg Oral Daily  . montelukast  10 mg Oral QHS  . pantoprazole  40 mg Oral Daily  . pentoxifylline  400 mg Oral Daily  . polyethylene glycol  17 g Oral Daily  . pravastatin  40 mg Oral Daily   Continuous Infusions:       Time spent: 35 minutes    [facility] A  [facility] Pager [phone] If 7PM-7AM, please contact night-coverage at [email or web address], password [facility] 08/12/2015, 12:18 PM  LOS: 4 days

## 2015-08-12 NOTE — Transfer of Care (Signed)
Immediate Anesthesia Transfer of Care Note  Patient: Tricia Gonzales  Procedure(s) Performed: Procedure(s): ESOPHAGOGASTRODUODENOSCOPY (EGD) WITH PROPOFOL (N/A)  Patient Location: Endoscopy Unit  Anesthesia Type:MAC  Level of Consciousness: awake, alert  and oriented  Airway & Oxygen Therapy: Patient Spontanous Breathing and Patient connected to nasal cannula oxygen  Post-op Assessment: Report given to RN, Post -op Vital signs reviewed and stable and Patient moving all extremities  Post vital signs: Reviewed and stable  Last Vitals:  Filed Vitals:   08/12/15 1035 08/12/15 1201  BP: 186/79 165/87  Pulse: 79 71  Temp: 37.1 C   Resp: 15 18    Complications: No apparent anesthesia complications

## 2015-08-12 NOTE — Anesthesia Preprocedure Evaluation (Addendum)
Anesthesia Evaluation  Patient identified by MRN, date of birth, ID band Patient awake    Reviewed: Allergy & Precautions, NPO status , Patient's Chart, lab work & pertinent test results  Airway Mallampati: III  TM Distance: >3 FB Neck ROM: Full    Dental  (+) Poor Dentition   Pulmonary former smoker,    breath sounds clear to auscultation       Cardiovascular hypertension, + pacemaker  Rhythm:Regular Rate:Normal  Echo 2017 with normal EF, no signficant valve abnormalities  Hx of AV block, second degree, requiring permanent pacemaker   Neuro/Psych PSYCHIATRIC DISORDERS Anxiety Depression    GI/Hepatic hiatal hernia,   Endo/Other  diabetesMorbid obesity  Renal/GU CRFRenal disease     Musculoskeletal  (+) Arthritis ,   Abdominal   Peds  Hematology  (+) anemia ,   Anesthesia Other Findings   Reproductive/Obstetrics                           Anesthesia Physical Anesthesia Plan  ASA: III  Anesthesia Plan: MAC   Post-op Pain Management:    Induction: Intravenous  Airway Management Planned: Nasal Cannula  Additional Equipment:   Intra-op Plan:   Post-operative Plan:   Informed Consent: I have reviewed the patients History and Physical, chart, labs and discussed the procedure including the risks, benefits and alternatives for the proposed anesthesia with the patient or authorized representative who has indicated his/her understanding and acceptance.   Dental advisory given  Plan Discussed with: CRNA and Anesthesiologist  Anesthesia Plan Comments:         Anesthesia Quick Evaluation

## 2015-08-12 NOTE — Care Management Important Message (Signed)
Important Message  Patient Details  Name: Tricia Gonzales MRN: [id number] Date of Birth: 1940-07-27   Medicare Important Message Given:  Yes    Loann Quill 08/12/2015, 11:43 AM

## 2015-08-12 NOTE — Progress Notes (Signed)
Physical Therapy Treatment Patient Details Name: Tricia Gonzales MRN: [id number] DOB: 1940/09/17 Today's Date: 08/12/2015    History of Present Illness 75 yo female with onset of chest pain and had pacemaker implanted Dec 2016, now  has PT consult for mobility    PT Comments    Pt performed increased gait distance with successful completion of stair training.  O2 sats remain to decrease on room air but improves with standing rest breaks and pursed lip breathing.    Follow Up Recommendations  SNF     Equipment Recommendations  None recommended by PT    Recommendations for Other Services Rehab consult     Precautions / Restrictions Precautions Precautions: Fall;ICD/Pacemaker Precaution Comments: Pt with ?stress fx in LLE, was not able to go to ortho appointment this week due to illness Restrictions Weight Bearing Restrictions: No    Mobility  Bed Mobility Overal bed mobility:  (Pt recieved in recliner chair.  )                Transfers Overall transfer level: Modified independent   Transfers: Sit to/from Stand Sit to Stand: Modified independent (Device/Increase time) Stand pivot transfers: Modified independent (Device/Increase time)       General transfer comment: slow to stand, but does not require assist, demonstrated good technique.    Ambulation/Gait Ambulation/Gait assistance: Min guard Ambulation Distance (Feet): 276 Feet Assistive device: Rolling walker (2 wheeled) Gait Pattern/deviations: Step-through pattern;Wide base of support Gait velocity: decreased   General Gait Details: Pt required cues for pacing to maintain O2 sats greater than 90%.  Pt required cues for safety with RW position.     Stairs Stairs: Yes Stairs assistance: Min guard Stair Management: Two rails Number of Stairs: 5 General stair comments: Cues for sequencing and hand placement on rails to improve safety.  O2 sats decreased to 84% on RA returning quickly to 90% with standing  rest break.    Wheelchair Mobility    Modified Rankin (Stroke Patients Only)       Balance     Sitting balance-Leahy Scale: Good       Standing balance-Leahy Scale: Fair                      Cognition Arousal/Alertness: Awake/alert Behavior During Therapy: WFL for tasks assessed/performed Overall Cognitive Status: Within Functional Limits for tasks assessed                      Exercises      General Comments        Pertinent Vitals/Pain Pain Assessment: No/denies pain    Home Living                      Prior Function            PT Goals (current goals can now be found in the care plan section) Acute Rehab PT Goals Potential to Achieve Goals: Good Progress towards PT goals: Progressing toward goals    Frequency  Min 3X/week    PT Plan      Co-evaluation             End of Session Equipment Utilized During Treatment: Gait belt Activity Tolerance: Patient tolerated treatment well Patient left: in bed;with call bell/phone within reach     Time: 0947-1002 PT Time Calculation (min) (ACUTE ONLY): 15 min  Charges:  $Gait Training: 8-22 mins  G Codes:      Cristela Blue 08/12/2015, 12:50 PM  Governor Rooks, PTA pager [phone]

## 2015-08-12 NOTE — Anesthesia Postprocedure Evaluation (Signed)
Anesthesia Post Note  Patient: Tricia Gonzales  Procedure(s) Performed: Procedure(s) (LRB): ESOPHAGOGASTRODUODENOSCOPY (EGD) WITH PROPOFOL (N/A)  Patient location during evaluation: PACU Anesthesia Type: MAC Level of consciousness: awake and alert Pain management: pain level controlled Vital Signs Assessment: post-procedure vital signs reviewed and stable Respiratory status: spontaneous breathing, nonlabored ventilation, respiratory function stable and patient connected to nasal cannula oxygen Cardiovascular status: stable and blood pressure returned to baseline Anesthetic complications: no    Last Vitals:  Filed Vitals:   08/12/15 1035 08/12/15 1201  BP: 186/79 165/87  Pulse: 79 71  Temp: 37.1 C 36.4 C  Resp: 15 18    Last Pain:  Filed Vitals:   08/12/15 1205  PainSc: 0-No pain                 Zenaida Deed

## 2015-08-12 NOTE — H&P (View-Only) (Signed)
Patient ID: Tricia Gonzales, female   DOB: 1941-01-12, 75 y.o.   MRN: [id number]    Progress Note   Subjective  Up walking in room. Feels tired, fatigued  Denies SOB Has not required O2 and sats in mid 90's   Objective   Vital signs in last 24 hours: Temp:  [97.5 F (36.4 C)-98.1 F (36.7 C)] 98.1 F (36.7 C) (02/19 0549) Pulse Rate:  [65-71] 70 (02/19 0549) Resp:  [17-18] 18 (02/19 0549) BP: (142-179)/(56-79) 147/56 mmHg (02/19 0549) SpO2:  [96 %-100 %] 96 % (02/19 0549) Last BM Date: 08/09/15 General: obese    white female in NAD Heart:  Regular rate and rhythm; no murmurs Lungs: Respirations even and unlabored, lungs  Few fines rales Abdomen:  Soft,obese, nontender and nondistended. Normal bowel sounds. Extremities:  Without edema. Neurologic:  Alert and oriented,  grossly normal neurologically. Psych:  Cooperative. Normal mood and affect.  Intake/Output from previous day: 02/18 0701 - 02/19 0700 In: 600 [P.O.:600] Out: -  Intake/Output this shift:    Lab Results:  Recent Labs  08/09/15 0320 08/10/15 0455 08/11/15 0248  WBC 9.5 8.0 6.6  HGB 7.9* 8.0* 7.9*  HCT 25.8* 26.5* 26.4*  PLT 268 280 292   BMET  Recent Labs  08/09/15 0320 08/10/15 0455 08/11/15 0248  NA 141 141 140  K 5.1 5.2* 5.3*  CL 109 111 112*  CO2 24 24 21*  GLUCOSE 179* 221* 182*  BUN 71* 68* 58*  CREATININE 1.84* 1.62* 1.33*  CALCIUM 7.5* 7.7* 7.9*   LFT No results for input(s): PROT, ALBUMIN, AST, ALT, ALKPHOS, BILITOT, BILIDIR, IBILI in the last 72 hours. PT/INR No results for input(s): LABPROT, INR in the last 72 hours.  Studies/Results: No results found.     Assessment / Plan:     #1  75 yo female with chronic anemia(CKD) , with recent drop and heme + stool  Colon 2013 with diverticulosis only On chronic PPI for GERd, c/o burning in stomach Will proceed with EGD tomorrow with Dr Havery [name]- believe best for her to be done prior to Discharge, as would need to be done at  Hospital due to BMI etc NPO in am for EGD HGB stable- repeat in am  #2 Pericarditis- on steroids #3 morbid obesity #4 Sjogren's #5 AODM #6 pacemaker- junctional brady #7 chronic antiplatelet rx- on ASA and trental       Principal Problem:   Chest pain Active Problems:   Pacemaker   Diabetes mellitus type 2, controlled ([place])   Hyperlipidemia   Hypertension   CKD (chronic kidney disease) stage 3, GFR 30-59 ml/min   Chronic anemia   Acute pericarditis   UTI (urinary tract infection)   Chest pain at rest   Heme positive stool     LOS: 3 days   [name]  08/11/2015, 9:14 AM

## 2015-08-12 NOTE — Progress Notes (Signed)
Patient ID: Tricia Gonzales, female   DOB: 08/07/1940, 75 y.o.   MRN: [id number]   EGD today  Revealed acute gastritis  Which explains her heme + stool -especially since on 325 mg ASA daily and trental.  Treat with BID PPI x 6 weeks then back to Qam  PPI chronically  Will follow up H pylori and treat if positive GI will sign off   Regular diet

## 2015-08-12 NOTE — Interval H&P Note (Signed)
History and Physical Interval Note:  08/12/2015 11:19 AM  Tricia Gonzales  has presented today for surgery, with the diagnosis of anemia , heme positive  The various methods of treatment have been discussed with the patient and family. After consideration of risks, benefits and other options for treatment, the patient has consented to  Procedure(s): ESOPHAGOGASTRODUODENOSCOPY (EGD) WITH PROPOFOL (N/A) as a surgical intervention .  The patient's history has been reviewed, patient examined, no change in status, stable for surgery.  I have reviewed the patient's chart and labs.  Questions were answered to the patient's satisfaction.     Renelda Loma [name]

## 2015-08-12 NOTE — Anesthesia Procedure Notes (Signed)
Procedure Name: MAC Date/Time: 08/12/2015 11:41 AM Performed by: Kyung Rudd Pre-anesthesia Checklist: Patient identified, Emergency Drugs available, Suction available and Patient being monitored Patient Re-evaluated:Patient Re-evaluated prior to inductionOxygen Delivery Method: Nasal cannula Intubation Type: IV induction Placement Confirmation: positive ETCO2 Dental Injury: Teeth and Oropharynx as per pre-operative assessment

## 2015-08-12 NOTE — Op Note (Addendum)
[facility] [place], [id number]   ENDOSCOPY PROCEDURE REPORT  PATIENT: Tricia Gonzales, Tricia Gonzales  MR#: [id number] BIRTHDATE: 29-Oct-1940 , 75  yrs. old GENDER: female ENDOSCOPIST: Yetta Flock, MD REFERRED BY: PROCEDURE DATE:  08/12/2015 PROCEDURE:  EGD w/ biopsy ASA CLASS:     Class III INDICATIONS:  anemia. MEDICATIONS: Per Anesthesia TOPICAL ANESTHETIC: Lidocaine Spray  DESCRIPTION OF PROCEDURE: After the risks benefits and alternatives of the procedure were thoroughly explained, informed consent was obtained.  The PENTAX GASTOROSCOPE [id number] endoscope was introduced through the mouth and advanced to the second portion of the duodenum , Without limitations.  The instrument was slowly withdrawn as the mucosa was fully examined.  The esophagus was normal in appearance.  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  DH noted 42cm from the incisors with GEJ and SCJ located 40cm from the incisors, with a 2cm hiatal hernia.  The stomach was remarkable for erosive gastritis in the antrum / pylorus, without focal ulceration or stigmata of bleeding.  The proximal stomach and fundus were normal.  Biopsies were taken of the antrum and body to rule out H pylori.  The duodenal bulb and 2nd portion of the duodenum appeared normal.  Retroflexed views revealed no abnormalities.     The scope was then withdrawn from the patient and the procedure completed.  COMPLICATIONS: There were no immediate complications.  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

## 2015-08-12 NOTE — Progress Notes (Signed)
OT Cancellation Note  Patient Details Name: SIENA POEHLER MRN: [id number] DOB: 22-Oct-1940   Cancelled Treatment:    Reason Eval/Treat Not Completed: Patient at procedure or test/ unavailable.  Pt. Having EGD this am.  Will check back as pt.  medically able.    Janice Coffin, COTA/L 08/12/2015, 10:21 AM

## 2015-08-13 ENCOUNTER — Encounter (HOSPITAL_COMMUNITY): Payer: Self-pay | Admitting: Gastroenterology

## 2015-08-13 LAB — BASIC METABOLIC PANEL
Anion gap: 7 (ref 5–15)
Anion gap: 10 (ref 5–15)
BUN: 39 mg/dL — ABNORMAL HIGH (ref 6–20)
BUN: 34 mg/dL — ABNORMAL HIGH (ref 6–20)
CO2: 25 mmol/L (ref 22–32)
CO2: 24 mmol/L (ref 22–32)
Calcium: 8.3 mg/dL — ABNORMAL LOW (ref 8.9–10.3)
Calcium: 9 mg/dL (ref 8.9–10.3)
Chloride: 110 mmol/L (ref 101–111)
Chloride: 109 mmol/L (ref 101–111)
Creatinine, Ser: 1.21 mg/dL — ABNORMAL HIGH (ref 0.44–1.00)
Creatinine, Ser: 1.22 mg/dL — ABNORMAL HIGH (ref 0.44–1.00)
GFR calc Af Amer: 50 mL/min — ABNORMAL LOW (ref >60)
GFR calc Af Amer: 49 mL/min — ABNORMAL LOW (ref >60)
GFR calc non Af Amer: 43 mL/min — ABNORMAL LOW (ref >60)
GFR calc non Af Amer: 43 mL/min — ABNORMAL LOW (ref >60)
Glucose, Bld: 220 mg/dL — ABNORMAL HIGH (ref 65–99)
Glucose, Bld: 224 mg/dL — ABNORMAL HIGH (ref 65–99)
Potassium: 5.8 mmol/L — ABNORMAL HIGH (ref 3.5–5.1)
Potassium: 4.9 mmol/L (ref 3.5–5.1)
Sodium: 142 mmol/L (ref 135–145)
Sodium: 143 mmol/L (ref 135–145)

## 2015-08-13 LAB — GLUCOSE, CAPILLARY
Glucose-Capillary: 221 mg/dL — ABNORMAL HIGH (ref 65–99)
Glucose-Capillary: 201 mg/dL — ABNORMAL HIGH (ref 65–99)
Glucose-Capillary: 272 mg/dL — ABNORMAL HIGH (ref 65–99)

## 2015-08-13 LAB — CBC
HCT: 29.1 % — ABNORMAL LOW (ref 36.0–46.0)
Hemoglobin: 8.5 g/dL — ABNORMAL LOW (ref 12.0–15.0)
MCH: 26 pg (ref 26.0–34.0)
MCHC: 29.2 g/dL — ABNORMAL LOW (ref 30.0–36.0)
MCV: 89 fL (ref 78.0–100.0)
Platelets: 369 10*3/uL (ref 150–400)
RBC: 3.27 MIL/uL — ABNORMAL LOW (ref 3.87–5.11)
RDW: 15.8 % — ABNORMAL HIGH (ref 11.5–15.5)
WBC: 9.7 10*3/uL (ref 4.0–10.5)

## 2015-08-13 MED ORDER — SODIUM POLYSTYRENE SULFONATE 15 GM/60ML PO SUSP
30.0000 g | Freq: Once | ORAL | Status: AC
  Administered 2015-08-13: 30 g via ORAL
  Filled 2015-08-13: qty 120

## 2015-08-13 MED ORDER — PANTOPRAZOLE SODIUM 40 MG PO TBEC: 40.0000 mg | DELAYED_RELEASE_TABLET | Freq: Two times a day (BID) | ORAL | Status: DC

## 2015-08-13 MED ORDER — CEFUROXIME AXETIL 500 MG PO TABS: 500.0000 mg | ORAL_TABLET | Freq: Two times a day (BID) | ORAL | Status: DC

## 2015-08-13 MED ORDER — CEFUROXIME AXETIL 500 MG PO TABS
500.0000 mg | ORAL_TABLET | Freq: Two times a day (BID) | ORAL | Status: DC
  Administered 2015-08-13: 500 mg via ORAL
  Filled 2015-08-13: qty 1

## 2015-08-13 MED ORDER — AZITHROMYCIN 500 MG PO TABS
500.0000 mg | ORAL_TABLET | ORAL | Status: DC
  Administered 2015-08-13: 500 mg via ORAL
  Filled 2015-08-13: qty 1

## 2015-08-13 NOTE — Progress Notes (Signed)
Patient refused both lasix and and kayexalate yesterday despite teaching.  Yesterday she complained that getting up and down to go to the toilet was too taxing for her. This morning the patient refused kayexalate again. She stated that she is sick on her stomach because of taking Miralax yesterday.  She said that the Miralax should have lowered her potassium level since it made her have several bowel movements yesterday. Patient educated that Miralax does not work the same as Kayexalate which reduces potassium level in the blood. She was educated on the dangers of hyperkalemia. She was also given printed educational material on hyperkalemia. Doctor [facility] notified and will come to speak with the patient.  Will continue to encourage patient to take medication as ordered.

## 2015-08-13 NOTE — Progress Notes (Signed)
Inpatient Diabetes Program Recommendations  AACE/ADA: New Consensus Statement on Inpatient Glycemic Control (2015)  Target Ranges:  Prepandial:   less than 140 mg/dL      Peak postprandial:   less than 180 mg/dL (1-2 hours)      Critically ill patients:  140 - 180 mg/dL   Results for NADIYA, PIERATT (MRN [id number]) as of 08/13/2015 08:14  Ref. Range 08/12/2015 06:19 08/12/2015 14:27 08/12/2015 16:05 08/12/2015 20:37 08/13/2015 07:01  Glucose-Capillary Latest Ref Range: 65-99 mg/dL 163 (H) 167 (H) 257 (H) 187 (H) 221 (H)   Review of Glycemic Control  Diabetes history: DM 2 Outpatient Diabetes medications: Glipizide 10 mg BID, Januvia 50 mg Daily Current orders for Inpatient glycemic control: Novolog Sensitive TID + HS, Tradjenta 5 Daily  Inpatient Diabetes Program Recommendations: Correction (SSI): Glucose increasing into the 200's occassionally. Please consider increasing correction to Novolog moderate TID.  Thanks,  Tama Headings RN, MSN, [facility] Inpatient Diabetes Coordinator Team Pager [phone] (8a-5p)

## 2015-08-13 NOTE — Progress Notes (Signed)
Discharge Note:  Patient alert and oriented X 4 and in no apparent distress.  Patient given discharge instructions regarding medications, activity, diet, and upcoming appointments.  Patient verbalized understanding of all instructions.  Telemetry and peripheral IV discontinued.  Patient confirmed that she had all of her personal belongings. She was transported out via wheelchair by this RN.

## 2015-08-13 NOTE — Care Management Note (Signed)
Case Management Note  Patient Details  Name: Tricia Gonzales MRN: [id number] Date of Birth: 07-24-1940  Subjective/Objective:       Chest pain, PNA             Action/Plan:  NCM spoke to pt and offered choice/ [facility] list provided. Pt states she had [facility] and [facility] in the past. Requesting [facility] for [facility] Pt states she has RW at home. States 3n1 not needed. States her dtr and son both live in town. Her pharmacy delivers her medications to her home. States no problems with paying for meds. States her sisters are both in town and can assist as needed.   Expected Discharge Date:  08/13/2015               Expected Discharge Plan:  [place]  In-House Referral:  Clinical Social Work  Discharge planning Services  CM Consult  Post Acute Care Choice:  Home Health Choice offered to:  Patient  DME Arranged:  N/A DME Agency:  NA  HH Arranged:  RN, PT, OT, Nurse's Aide [place] Agency:  [place]  Status of Service:  Completed, signed off  Medicare Important Message Given:  Yes Date Medicare IM Given:    Medicare IM give by:    Date Additional Medicare IM Given:    Additional Medicare Important Message give by:     If discussed at [place] of Stay Meetings, dates discussed:    Additional Comments:  Erenest Rasher, RN 08/13/2015, 4:25 PM

## 2015-08-13 NOTE — Progress Notes (Signed)
Utilization review completed

## 2015-08-13 NOTE — Discharge Summary (Signed)
Physician Discharge Summary  Tricia Gonzales KVQ:[id number] DOB: Nov 25, 1940 DOA: 08/06/2015  PCP: Reginia Naas, MD  Admit date: 08/06/2015 Discharge date: 08/13/2015  Time spent: 40 minutes  Recommendations for Outpatient Follow-up:  1. Follow-up with primary care physician within one week. 2. Protonix switched to twice a day.   Discharge Diagnoses:  Principal Problem:   Chest pain Active Problems:   Pacemaker   Diabetes mellitus type 2, controlled ([place])   Hyperlipidemia   Hypertension   CKD (chronic kidney disease) stage 3, GFR 30-59 ml/min   Chronic anemia   Acute pericarditis   UTI (urinary tract infection)   Chest pain at rest   Heme positive stool   Discharge Condition: Stable  Diet recommendation: Carbohydrate modified diet  Filed Weights   08/06/15 0500 08/09/15 0534 08/12/15 1035  Weight: 113.2 kg (249 lb 9 oz) 122.5 kg (270 lb 1 oz) 122.471 kg (270 lb)    History of present illness:  Tricia Gonzales is a 75 y.o. 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 Patient has been having chest pain since yesterday morning. Pain is only on deep breathing. Denies any associated productive cough shortness of breath fever chills. Chest x-ray did not show anything acute and cardiac markers were negative but d-dimer was elevated. Patient has been admitted for further management.   Hospital Course:   Chest pain Pleuritic, has positive d-dimer is, VQ scan is negative for PEs. Patient also has -3 sets of cardiac enzymes and 12-lead EKG. 2-D echo showed no evidence of pericardial effusion, normal LVEF. Per cardiology this is likely acute pleuropericarditis, improved with a steroids, steroids discontinued. This is pleuropericarditis versus CAP  Community  acquired pneumonia, presumed Patient came in with chest pain and fever, not much of SOB and sputum production. CT scan of the chest showed atelectasis versus infiltrates. Because of patient fever, leukocytosis and increased procalcitonin, I well start her on antibiotics for pneumonia. Supportive management with mucolytics, bronchial dilators, antitussives and oxygen as needed. Ceftin 500 mg for 5 more days of discharge.  Erosive esophagitis Seen by GI, PPI ordered as twice a day. Biopsies taken and serology for H. pylori Advised not to take any NSAIDs, aspirin decreased to 81 mg.  Volume overload Patient has CKD stage III, been on Lasix at home, Lasix held at the time of admission. Patient received transfusion and IV fluids, continue with volume overload. Started on Lasix, 2-D echo showed LVEF of 60-65%, does not have history of chronic CHF.  Home dose of Lasix restarted on discharge.  Anemia Severe anemia with hemoglobin of 6.3, hemoglobin baseline is 8.2 from December 2016. This acute anemia on top of anemia of chronic disease secondary to CKD stage III. Patient required transfusion of 2 units of packed RBCs, FOBT was positive EGD done and showed erosive esophagitis.  UTI Urinalysis is consistent with UTI, on Rocephin. Culture growing Escherichia coli, adjust antibiotics according to culture results.  Hypotension Unclear etiology, could be secondary to diuretics and opioid narcotics. Blood pressure is improved and back to normal.  History of second-degree AV block Permanent pacemaker in place, interrogated by cardiology.  Acute renal failure and chronic kidney disease stage III. Patient has chronic disease with creatinine baseline of 1.8, presented with creatinine of 2.26. Creatinine is back to baseline of  1.6. Lasix restarted  Hyperkalemia Potassium today is 5.7, improved to 4.9 after dose of Kayexalate. Patient is okay for discharge.   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 HEENT: anicteric sclera, pupils reactive to light and accommodation, EOMI CVS: S1-S2 clear, no murmur rubs or gallops Chest: clear to auscultation bilaterally, no wheezing, rales or rhonchi Abdomen: soft nontender, nondistended, normal bowel sounds, no organomegaly Extremities: no cyanosis, clubbing or edema noted bilaterally Neuro: Cranial nerves II-XII intact, no focal neurological deficits  Discharge Instructions   Discharge Instructions    Diet - low sodium heart healthy    Complete by:  As directed      Increase activity slowly    Complete by:  As directed           Current Discharge Medication List    START taking these medications   Details  cefUROXime (CEFTIN) 500 MG tablet Take 1 tablet (500 mg total) by mouth 2 (two) times daily with a meal. Qty: 10 tablet, Refills: 0      CONTINUE these medications which have CHANGED   Details  pantoprazole (PROTONIX) 40 MG tablet Take 1 tablet (40 mg total) by mouth 2 (two) times daily before a meal. Qty: 60 tablet, Refills: 0      CONTINUE these medications which have NOT CHANGED   Details  acetaminophen (TYLENOL) 500 MG tablet Take 1,500 mg by mouth at bedtime.     aspirin EC 81 MG tablet Take 81 mg by mouth daily.    cholecalciferol (VITAMIN D) 1000 UNITS tablet Take 2,000 Units by mouth at bedtime.    denosumab (PROLIA) 60 MG/ML SOLN injection Inject 60 mg into the skin every 6 (six) months. Administer in upper arm, thigh, or abdomen     docusate sodium (COLACE) 100 MG capsule Take 100 mg by mouth every other day.    doxazosin (CARDURA) 4 MG tablet Take 4 mg by mouth daily.    furosemide (LASIX) 40 MG tablet Take 40 mg by mouth daily.    gabapentin (NEURONTIN) 300 MG capsule Take 300 mg by mouth 2 (two) times daily.     glipiZIDE (GLUCOTROL XL) 10 MG 24 hr tablet Take 10 mg by mouth 2 (two) times daily.    HYDROcodone-acetaminophen (NORCO/VICODIN) 5-325 MG tablet Take 1 tablet by mouth every 6 (six) hours as needed for moderate pain.    hydroxychloroquine (PLAQUENIL) 200 MG tablet Take 400 mg by mouth daily.     irbesartan (AVAPRO) 300 MG tablet Take 300 mg by mouth daily.    montelukast (SINGULAIR) 10 MG tablet Take 10 mg by mouth at bedtime.     Multiple Vitamin (MULITIVITAMIN WITH MINERALS) TABS Take 1 tablet by mouth daily.    pentoxifylline (TRENTAL) 400 MG CR tablet Take 400 mg by mouth daily.    pravastatin (PRAVACHOL) 40 MG tablet Take 40 mg by mouth daily.     sitaGLIPtin (JANUVIA) 50 MG tablet Take 50 mg by mouth daily.    ULORIC 40 MG tablet  Take 40 mg by mouth daily.     ACCU-CHEK AVIVA PLUS test strip 1 each by Other route daily.        Allergies  Allergen Reactions  . Norvasc [Amlodipine Besylate] Shortness Of Breath  . Glucophage [Metformin Hydrochloride] Other (See Comments)    "increases creatinine"  . Lisinopril     Headache   . Losartan     Headache   . Metoprolol Other (See Comments)    Passed out  . Morphine And Related Other (See Comments)    "hallucinations"  . Simvastatin Other (See Comments)    Body aches  . Trulicity [Dulaglutide] Other (See Comments)    Severe mood swings   Follow-up Information    Follow up with Reginia Naas, MD In 1 week.   Specialty:  Family Medicine   Contact information:   [address] [place] [address] [phone]        The results of significant diagnostics from this hospitalization (including imaging,  microbiology, ancillary and laboratory) are listed below for reference.    Significant Diagnostic Studies: Ct Chest Wo Contrast  08/07/2015  CLINICAL DATA:  Moderate to severe chest pain EXAM: CT CHEST WITHOUT CONTRAST TECHNIQUE: Multidetector CT imaging of the chest was performed following the standard protocol without IV contrast. COMPARISON:  Chest x-ray 08/06/2015.  Chest CT 08/17/2014 FINDINGS: Mediastinum / Lymph Nodes: There is no axillary lymphadenopathy. 11 mm right thyroid nodule noted. No mediastinal lymphadenopathy. There is no hilar lymphadenopathy. Heart is enlarged. Trace pericardial fluid/thickening noted. The esophagus has normal imaging features. Left dual lead permanent pacemaker again noted. Lungs / Pleura: Bilateral lower lobe collapse/consolidation, right greater than left. There is some trace probable atelectasis in the posterior left upper lobe. Small bilateral pleural effusions, right greater than left. Upper Abdomen:  Unremarkable. MSK / Soft Tissues: Bone windows reveal no worrisome lytic or sclerotic osseous lesions. IMPRESSION: Low volume film with bilateral lower lobe collapse/ consolidation (right greater than left) and small bilateral pleural effusions. Electronically Signed   By: Misty Stanley M.D.   On: 08/07/2015 17:09   Nm Pulmonary Perf And Vent  08/06/2015  CLINICAL DATA:  New onset chest pain with shortness of Breath EXAM: NUCLEAR MEDICINE VENTILATION - PERFUSION LUNG SCAN TECHNIQUE: Ventilation images were obtained in multiple projections using inhaled aerosol Tc-60mDTPA. Perfusion images were obtained in multiple projections after intravenous injection of Tc-967mAA. RADIOPHARMACEUTICALS:  3167.1illicuries [id number] aerosol inhalation and 4.3 millicuries [id number] IV COMPARISON:  Chest x-ray from earlier in the same day FINDINGS: Ventilation: Mild clumping is noted centrally. No significant ventilation defect is identified. Perfusion: Adequate  perfusion is noted. A defect is noted related to the pacemaker pack anteriorly on the left. No perfusion defect to suggest pulmonary embolism is seen. IMPRESSION: No evidence of pulmonary embolism. Electronically Signed   By: MaInez Catalina.D.   On: 08/06/2015 12:20   Dg Chest Portable 1 View  08/06/2015  CLINICAL DATA:  Shortness of breath. Chest pain on inspiration for 1 day. EXAM: PORTABLE CHEST 1 VIEW COMPARISON:  06/19/2015 FINDINGS: Cardiac pacemaker. Shallow inspiration. Mild cardiac enlargement without significant vascular congestion. No focal airspace disease or consolidation in the lungs. No blunting of costophrenic angles. No pneumothorax. Calcified and tortuous aorta. IMPRESSION: Mild cardiac enlargement.  No evidence of active pulmonary disease. Electronically Signed   By: WiLucienne Capers.D.   On: 08/06/2015 01:55    Microbiology: Recent Results (from the past 240 hour(s))  MRSA PCR Screening     Status: None   Collection Time: 08/06/15 11:24 AM  Result Value Ref Range Status   MRSA by PCR NEGATIVE NEGATIVE Final    Comment:        The GeneXpert MRSA Assay (FDA approved for NASAL specimens only), is one component of a comprehensive MRSA colonization surveillance program. It is not intended to diagnose MRSA infection nor to guide or monitor treatment for MRSA infections.   Culture, blood (routine x 2)     Status: None   Collection Time: 08/06/15  8:46 PM  Result Value Ref Range Status   Specimen Description BLOOD LEFT HAND  Final   Special Requests   Final    BOTTLES DRAWN AEROBIC AND ANAEROBIC 4.5CC BLUE 5CC RED   Culture NO GROWTH 5 DAYS  Final   Report Status 08/11/2015 FINAL  Final  Culture, Urine     Status: None   Collection Time: 08/06/15  9:37 PM  Result Value Ref Range Status   Specimen Description URINE, CLEAN CATCH  Final   Special Requests NONE  Final   Culture >=100,000 COLONIES/mL ESCHERICHIA COLI  Final   Report Status 08/10/2015 FINAL  Final    Organism ID, Bacteria ESCHERICHIA COLI  Final      Susceptibility   Escherichia coli - MIC*    AMPICILLIN 4 SENSITIVE Sensitive     CEFAZOLIN <=4 SENSITIVE Sensitive     CEFTRIAXONE <=1 SENSITIVE Sensitive     CIPROFLOXACIN <=0.25 SENSITIVE Sensitive     GENTAMICIN <=1 SENSITIVE Sensitive     IMIPENEM <=0.25 SENSITIVE Sensitive     NITROFURANTOIN <=16 SENSITIVE Sensitive     TRIMETH/SULFA <=20 SENSITIVE Sensitive     AMPICILLIN/SULBACTAM 4 SENSITIVE Sensitive     PIP/TAZO <=4 SENSITIVE Sensitive     * >=100,000 COLONIES/mL ESCHERICHIA COLI  Culture, blood (routine x 2)     Status: None   Collection Time: 08/06/15 11:56 PM  Result Value Ref Range Status   Specimen Description BLOOD LEFT HAND  Final   Special Requests IN PEDIATRIC BOTTLE 1CC  Final   Culture NO GROWTH 5 DAYS  Final   Report Status 08/12/2015 FINAL  Final     Labs: Basic Metabolic Panel:  Recent Labs Lab 08/10/15 0455 08/11/15 0248 08/12/15 0341 08/13/15 0315 08/13/15 1211  NA 141 140 141 142 143  K 5.2* 5.3* 5.4* 5.8* 4.9  CL 111 112* 108 110 109  CO2 24 21* _0 GLUCOSE 221* 182* 163* 220* 224*  BUN 68* 58* 54* 39* 34*  CREATININE 1.62* 1.33* 1.38* 1.21* 1.22*  CALCIUM 7.7* 7.9* 8.3* 8.3* 9.0   Liver Function Tests: No results for input(s): AST, ALT, ALKPHOS, BILITOT, PROT, ALBUMIN in the last 168 hours. No results for input(s): LIPASE, AMYLASE in the last 168 hours. No results for input(s): AMMONIA in the last 168 hours. CBC:  Recent Labs Lab 08/06/15 2104  08/08/15 0528 08/09/15 0320 08/10/15 0455 08/11/15 0248 08/13/15 0315  WBC 21.4*  < > 14.5* 9.5 8.0 6.6 9.7  NEUTROABS 19.3*  --   --   --   --   --   --   HGB 7.1*  < > 8.6* 7.9* 8.0* 7.9* 8.5*  HCT 23.9*  < > 28.4* 25.8* 26.5* 26.4* 29.1*  MCV 87.5  < > 87.4 87.2 88.0 88.6 89.0  PLT 278  < > 262 268 280 292 369  < > = values in this interval not displayed.  Cardiac Enzymes:  Recent Labs Lab 08/06/15 1647 08/06/15 2356   TROPONINI 0.04* 0.04*   BNP: BNP (last 3 results)  Recent Labs  08/06/15 0146  BNP 233.6*    ProBNP (last 3 results) No results for input(s): PROBNP in the last 8760 hours.  CBG:  Recent Labs Lab 08/12/15 1427 08/12/15 1605 08/12/15 2037 08/13/15 0701 08/13/15 1140  GLUCAP 167* 257* 187* 221* 201*       Signed:  Verlee Monte A MD.  [facility] 08/13/2015, 3:45 PM

## 2015-08-13 NOTE — Progress Notes (Signed)
Occupational Therapy Treatment Patient Details Name: Tricia Gonzales MRN: [id number] DOB: August 06, 1940 Today's Date: 08/13/2015    History of present illness 75 yo female with onset of chest pain and had pacemaker implanted Dec 2016, now  has PT consult for mobility   OT comments  Pt eager to go home.  Fatigues easily, but able to perform LB dressing, toileting and standing grooming at a modified independent level.  Educated in energy conservation.  Follow Up Recommendations  Home health OT (Pt also requesting [facility].)    Equipment Recommendations       Recommendations for Other Services      Precautions / Restrictions Precautions Precautions: Fall;ICD/Pacemaker       Mobility Bed Mobility               General bed mobility comments: pt in chair  Transfers Overall transfer level: Modified independent Equipment used: Rolling walker (2 wheeled)                  Balance                                   ADL Overall ADL's : Needs assistance/impaired     Grooming: Wash/dry hands;Modified independent;Standing;Brushing hair;Oral care               Lower Body Dressing: Modified independent;Sit to/from stand   Toilet Transfer: Modified Independent;RW;Ambulation;Comfort height toilet   Toileting- Clothing Manipulation and Hygiene: Modified independent;Sit to/from stand       Functional mobility during ADLs: Rolling walker;Modified independent (in room) General ADL Comments: Instructed in energy conservation, pt verbalizing understanding.      Vision                     Perception     Praxis      Cognition   Behavior During Therapy: WFL for tasks assessed/performed Overall Cognitive Status: Within Functional Limits for tasks assessed                       Extremity/Trunk Assessment               Exercises     Shoulder Instructions       General Comments      Pertinent Vitals/ Pain       Pain  Assessment: No/denies pain  Home Living                                          Prior Functioning/Environment              Frequency Min 2X/week     Progress Toward Goals  OT Goals(current goals can now be found in the care plan section)  Progress towards OT goals: Progressing toward goals  Acute Rehab OT Goals Patient Stated Goal: return home  Plan Discharge plan needs to be updated    Co-evaluation                 End of Session Equipment Utilized During Treatment: Rolling walker   Activity Tolerance Patient tolerated treatment well   Patient Left in chair;with call bell/phone within reach   Nurse Communication          Time: [phone] OT Time Calculation (min): 29 min  Charges: OT General Charges $  OT Visit: 1 Procedure OT Treatments $Self Care/Home Management : 23-37 mins  Malka So 08/13/2015, 9:33 AM  [phone]

## 2015-08-20 ENCOUNTER — Telehealth: Payer: Self-pay | Admitting: Internal Medicine

## 2015-08-20 NOTE — Telephone Encounter (Signed)
New message      Talk to a nurse regarding patient's fast HR

## 2015-08-21 ENCOUNTER — Telehealth: Payer: Self-pay | Admitting: Internal Medicine

## 2015-08-21 MED ORDER — METOPROLOL SUCCINATE ER 25 MG PO TB24: 25.0000 mg | ORAL_TABLET | Freq: Every day | ORAL | Status: DC

## 2015-08-21 NOTE — Telephone Encounter (Signed)
See phone noted dated 08/21/15

## 2015-08-21 NOTE — Telephone Encounter (Signed)
Spoke with patient and she says she can sometimes feel her HR running fast at night but overall not that symptomatic.  She has just been told by 2 different healthcare professionals that her HR is up.  She has sent a transmission which shows she has been in afib 43 % of the time since 08/06/15.  Discussed with [name], NP,  will start her on Toprol 25 mg daily to slow her rates down and come in tomorrow to assess and make further recommendations.

## 2015-08-21 NOTE — Telephone Encounter (Signed)
New Message:  Pt called in stating that her heart rate has been running in the 120-130s and she would like to know what to do.

## 2015-08-22 ENCOUNTER — Encounter: Payer: Self-pay | Admitting: Internal Medicine

## 2015-08-22 ENCOUNTER — Ambulatory Visit (INDEPENDENT_AMBULATORY_CARE_PROVIDER_SITE_OTHER): Payer: Medicare Other | Admitting: Physician Assistant

## 2015-08-22 ENCOUNTER — Encounter: Payer: Self-pay | Admitting: Physician Assistant

## 2015-08-22 VITALS — BP 116/62 | HR 112 | Ht 59.0 in | Wt 252.0 lb

## 2015-08-22 DIAGNOSIS — I309 Acute pericarditis, unspecified: Secondary | ICD-10-CM | POA: Diagnosis not present

## 2015-08-22 DIAGNOSIS — I10 Essential (primary) hypertension: Secondary | ICD-10-CM | POA: Diagnosis not present

## 2015-08-22 LAB — CBC
HCT: 30.6 % — ABNORMAL LOW (ref 36.0–46.0)
Hemoglobin: 9.4 g/dL — ABNORMAL LOW (ref 12.0–15.0)
MCH: 26.5 pg (ref 26.0–34.0)
MCHC: 30.7 g/dL (ref 30.0–36.0)
MCV: 86.2 fL (ref 78.0–100.0)
MPV: 10.4 fL (ref 8.6–12.4)
Platelets: 350 10*3/uL (ref 150–400)
RBC: 3.55 MIL/uL — ABNORMAL LOW (ref 3.87–5.11)
RDW: 16 % — ABNORMAL HIGH (ref 11.5–15.5)
WBC: 7.6 10*3/uL (ref 4.0–10.5)

## 2015-08-22 LAB — BASIC METABOLIC PANEL
BUN: 64 mg/dL — ABNORMAL HIGH (ref 7–25)
CO2: 26 mmol/L (ref 20–31)
Calcium: 9.8 mg/dL (ref 8.6–10.4)
Chloride: 96 mmol/L — ABNORMAL LOW (ref 98–110)
Creat: 2.34 mg/dL — ABNORMAL HIGH (ref 0.60–0.93)
Glucose, Bld: 352 mg/dL — ABNORMAL HIGH (ref 65–99)
Potassium: 5 mmol/L (ref 3.5–5.3)
Sodium: 134 mmol/L — ABNORMAL LOW (ref 135–146)

## 2015-08-22 MED ORDER — METOPROLOL SUCCINATE ER 25 MG PO TB24: 37.5000 mg | ORAL_TABLET | Freq: Every day | ORAL | Status: DC

## 2015-08-22 NOTE — Patient Instructions (Signed)
Medication Instructions:  Your physician has recommended you make the following change in your medication:  1.  INCREASE the Metoprolol to 25 mg taking 1 1/2 tablet daily   Labwork: TODAY:  CBC & BMET  Testing/Procedures: NONE ORDERED  Follow-Up: Your physician recommends that you schedule a follow-up appointment in: 08/28/15 @ 9:30 WITH KATIE THOMPSON, PA-C   Any Other Special Instructions Will Be Listed Below (If Applicable).     If you need a refill on your cardiac medications before your next appointment, please call your pharmacy.

## 2015-08-22 NOTE — Progress Notes (Signed)
Cardiology Office Note   Date:  08/22/2015   ID:  Tricia Gonzales, DOB 08-06-1940, MRN [id number]  PCP:  Tricia Naas, MD  Cardiologist:  Dr. Radford Pax Dr. Rayann Heman ( saw in consult in 2013)    Newly diagnosed atrial fibrillation    History of Present Illness:  Tricia Gonzales is a 75 y.o. female with a history of Mobitz type 2 and syncope s/p dual chamber PPM implantation 06/18/2015 ([employer]), chronic anemia, Sjogren's dz., obesity, type 2 DM, CKD, HTN, HLD who presnts to clinic for evaluation of recently diagnosed afib with RVR on pacemaker interrogation.   She was admitted in 2013 for syncope but was on high dose BB at the time and so PPM was not indicated.   Admitted 12/26-12/29/17 for syncope and symptomatic Mobitz II and implantation of a MDT dual chamber PPM on 06/18/15 by [name].  Re-admitted 2/14-2/21 for chest pain. D dimer positive but V/Q scan negative for PE. She ruled out for MI. 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 NSAIDS were contraindicated due to erosive esophagitis.  She also was treated for CAP w/ fever, leukocytosis and increased procalcitonin. She was also found to be anemic requiring 2 unit transfusion. FOBT was positive and EGD showed erosive esophagitis and she was started on BID PPI. As above, NSAIDS contraindicated. She also had some hyperkalemia treated with Kayexelte. There was question of volume overload, she had some orthopnea and she was resumed on her home lasix.   Phone notes reveal that she has been found to be tachycardic recently. She has sent a transmission which showed she has been in afib 43 % of the time since 08/06/15. RN discussed with [name], NP and it was decided tostart her on Toprol 25 mg daily to slow her rates down and come in to be seen to discuss long term AC.  Today she presents to clinic for follow up. She sometimes feels a fluttering in her chest but  otherwise asymptomatic. She has chronic dyspnea but is very overweight. She has chronic LE edema which is not worse. She did have some orthopnea while in the hospital that is now better. No chest pain. She apparently had a sleep study in the past that was normal. No chest pain. No dizziness or syncope. No blood in stool or urine.    Past Medical History  Diagnosis Date  . Hypertension   . Obesity   . Sjogren's disease ([place])   . Hyperlipidemia   . Anemia   . Diverticulosis   . Intestinal obstruction ([place])   . Hiatal hernia   . Esophageal stricture   . Vitamin B12 deficiency   . Presence of permanent cardiac pacemaker   . Type II diabetes mellitus ([place])   . History of blood transfusion     "@ least w/1st knee OR"  . Osteoarthritis   . Arthritis   . Chronic back pain   . History of gout   . Anxiety   . Depression   . Kidney stones   . Chronic kidney disease (CKD), stage III (moderate)     Past Surgical History  Procedure Laterality Date  . Joint replacement    . Colectomy      for rectovaginal fistula  . Colonoscopy  01/07/2012    Procedure: COLONOSCOPY;  Surgeon: Lafayette Dragon, MD;  Location: [facility];  Service: Endoscopy;  Laterality: N/A;  . Ep implantable device N/A  06/18/2015    Procedure: Pacemaker Implant;  Surgeon: Evans Lance, MD;  Location: [facility];  Service: Cardiovascular;  Laterality: N/A;  . Cholecystectomy open    . Insert / replace / remove pacemaker    . Total knee arthroplasty Bilateral   . Revision total knee arthroplasty Left   . Abdominal hysterectomy    . Knee arthroscopy Right   . Esophagogastroduodenoscopy (egd) with esophageal dilation    . Esophagogastroduodenoscopy (egd) with propofol N/A 08/12/2015    Procedure: ESOPHAGOGASTRODUODENOSCOPY (EGD) WITH PROPOFOL;  Surgeon: Manus Gunning, MD;  Location: [facility];  Service: Gastroenterology;  Laterality: N/A;     Current Outpatient Prescriptions  Medication Sig  Dispense Refill  . ACCU-CHEK AVIVA PLUS test strip 1 each by Other route daily.     Marland Kitchen acetaminophen (TYLENOL) 500 MG tablet Take 1,500 mg by mouth at bedtime.     Marland Kitchen aspirin EC 81 MG tablet Take 81 mg by mouth daily.    . cholecalciferol (VITAMIN D) 1000 UNITS tablet Take 2,000 Units by mouth at bedtime.    Marland Kitchen denosumab (PROLIA) 60 MG/ML SOLN injection Inject 60 mg into the skin every 6 (six) months. Administer in upper arm, thigh, or abdomen    . docusate sodium (COLACE) 100 MG capsule Take 100 mg by mouth every other day.    . doxazosin (CARDURA) 4 MG tablet Take 4 mg by mouth daily.    . furosemide (LASIX) 40 MG tablet Take 40 mg by mouth daily.    Marland Kitchen gabapentin (NEURONTIN) 300 MG capsule Take 300 mg by mouth 2 (two) times daily.     Marland Kitchen glipiZIDE (GLUCOTROL XL) 10 MG 24 hr tablet Take 10 mg by mouth 2 (two) times daily.    Marland Kitchen HYDROcodone-acetaminophen (NORCO/VICODIN) 5-325 MG tablet Take 1 tablet by mouth every 6 (six) hours as needed for moderate pain.    Marland Kitchen irbesartan (AVAPRO) 300 MG tablet Take 300 mg by mouth daily.    . metoprolol succinate (TOPROL-XL) 25 MG 24 hr tablet Take 1.5 tablets (37.5 mg total) by mouth daily. 90 tablet 3  . montelukast (SINGULAIR) 10 MG tablet Take 10 mg by mouth at bedtime.     . Multiple Vitamin (MULITIVITAMIN WITH MINERALS) TABS Take 1 tablet by mouth daily.    . pantoprazole (PROTONIX) 40 MG tablet Take 1 tablet (40 mg total) by mouth 2 (two) times daily before a meal. 60 tablet 0  . pentoxifylline (TRENTAL) 400 MG CR tablet Take 400 mg by mouth daily.    . pravastatin (PRAVACHOL) 40 MG tablet Take 40 mg by mouth daily.     . sitaGLIPtin (JANUVIA) 50 MG tablet Take 50 mg by mouth daily.    Marland Kitchen ULORIC 40 MG tablet Take 40 mg by mouth daily.      No current facility-administered medications for this visit.    Allergies:   Norvasc; Glucophage; Lisinopril; Losartan; Morphine and related; Simvastatin; and Trulicity    Social History:  The patient  reports that  she quit smoking about 32 years ago. Her smoking use included Cigarettes. She has a 6 pack-year smoking history. She has never used smokeless tobacco. She reports that she drinks about 1.2 oz of alcohol per week. She reports that she does not use illicit drugs.   Family History:  The patient'sfamily history includes Diabetes in her brother and mother; Hypertension in her sister. There is no history of Colon cancer.    ROS:  Please see the history  of present illness.   Otherwise, review of systems are positive for none.   All other systems are reviewed and negative.    PHYSICAL EXAM: VS:  BP 116/62 mmHg  Pulse 112  Ht _0  (1.499 m)  Wt 252 lb (114.306 kg)  BMI 50.87 kg/m2 , BMI Body mass index is 50.87 kg/(m^2). GEN: Well nourished, well developed, in no acute distressobese HEENT: normal Neck: no JVD, carotid bruits, or masses Cardiac: irreg irreg, tachy; no murmurs, rubs, or gallops,no edema  Respiratory:  clear to auscultation bilaterally, normal work of breathing GI: soft, nontender, nondistended, + BS MS: no deformity or atrophy Skin: warm and dry, no rash Neuro:  Strength and sensation are intact Psych: euthymic mood, full affect   EKG:  EKG is ordered today. The ekg ordered today demonstrates afib with RVR HR 112. RBBB   Recent Labs: 03/22/2015: TSH 3.10 08/06/2015: ALT 12*; B Natriuretic Peptide 233.6* 08/13/2015: BUN 34*; Creatinine, Ser 1.22*; Hemoglobin 8.5*; Platelets 369; Potassium 4.9; Sodium 143    Lipid Panel No results found for: CHOL, TRIG, HDL, CHOLHDL, VLDL, LDLCALC, LDLDIRECT    Wt Readings from Last 3 Encounters:  08/22/15 252 lb (114.306 kg)  08/12/15 270 lb (122.471 kg)  06/19/15 249 lb 9.6 oz (113.218 kg)      Other studies Reviewed: Additional studies/ records that were reviewed today include: 2D ECHO Review of the above records demonstrates:   2D ECHO 08/06/2015 LV EF: 60% -  65% Study Conclusions - Left ventricle: The cavity size was  normal. Systolic function was normal. The estimated ejection fraction was in the range of 60% to 65%. Wall motion was normal; there were no regional wall motion abnormalities. - Aortic valve: Poorly visualized. - Right ventricle: Pacer wire or catheter noted in right ventricle. - Right atrium: Pacer wire or catheter noted in right atrium. - Atrial septum: There was increased thickness of the septum, consistent with lipomatous hypertrophy. - Pulmonary arteries: PA peak pressure: 50 mm Hg (S). Impressions: - The right ventricular systolic pressure was increased consistent with moderate pulmonary hypertension.    ASSESSMENT AND PLAN:  Tricia Gonzales is a 75 y.o. female with a history of Mobitz type 2 and syncope s/p dual chamber PPM implantation 06/18/2015 ([employer]), chronic anemia, Sjogren's dz., obesity, type 2 DM, CKD, HTN, HLD who presnts to clinic for evaluation of recently diagnosed afib with RVR on pacemaker interrogation.   Newly diagnosed afib with RVR: ECG today w/ atrial fib with RVR. HR 112 on Toprol XL 21m daily. Will start Eliquis 2.568mBID (GFR 50) CHADSVASC score of at least 4 (HTN, age, F sex, DM). Will get a CBC today and if okay we will start Eliquis and recheck CBC in 1 weeks. Will increase Toprol XL to 37.5 mg for better rate control.  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   CKD: will place her on renally dosed Eliquis 2.66m49mID if her CBC is stable. Will check BMET   HTN: BP well controlled on current regimen. Increase BB for better rate control as above  Anemia of chronic disease: baseline hemoglobin in 8s. Recently was FOBT + and required 2 U transfusion while admitted. Will check CBC today.   Obesity: discussed how afib is best managed by weight loss. She sad she has been trying to loose weight her whole life and cannot ever do it.   Erosive esophagitis: continue PPI.   Hyperkalemia: treated with Kayexelate  during  recent admission. Will check BMET today  Current medicines are reviewed at length with the patient today.  The patient does not have concerns regarding medicines.  The following changes have been made:  Will increase Toprol XL to 37.5 mg for better rate control. Add Eliquis 2.47m BID if CBC stable today.    Labs/ tests ordered today include:  Orders Placed This Encounter  Procedures  . CBC  . Basic Metabolic Panel (BMET)     Disposition:   FU with me next week.   SRenea Ee 08/22/2015 2:29 PM    [facility] [address] [place]  [id number]: [phone] Fax: [phone].cvof

## 2015-08-23 ENCOUNTER — Telehealth: Payer: Self-pay | Admitting: *Deleted

## 2015-08-23 ENCOUNTER — Telehealth: Payer: Self-pay | Admitting: Internal Medicine

## 2015-08-23 ENCOUNTER — Telehealth: Payer: Self-pay

## 2015-08-23 DIAGNOSIS — I10 Essential (primary) hypertension: Secondary | ICD-10-CM

## 2015-08-23 MED ORDER — APIXABAN 2.5 MG PO TABS: 2.5000 mg | ORAL_TABLET | Freq: Two times a day (BID) | ORAL | Status: DC

## 2015-08-23 NOTE — Telephone Encounter (Signed)
Prior auth for Eliquis 2.63m  Sent to [employer]

## 2015-08-23 NOTE — Telephone Encounter (Signed)
Calling because she states that she can draw the labs that are needed , she just needs a order . Can leave a message , she does have a confidential voice mail   Thanks

## 2015-08-23 NOTE — Telephone Encounter (Signed)
-----  Message from Eileen Stanford, PA-C sent at 08/23/2015  7:48 AM EST ----- Her kidney function is worse. Please have her hold her lasix for 3 days and come back Monday for repeat BMET. Also please tell her hemoglobin looks good and it is okay to start the Eliquis 2.31m BID. I will see her in the office next week to check blood counts again. thanks

## 2015-08-23 NOTE — Telephone Encounter (Signed)
Returned pt's call and clarified for the pt to take the Eliquis 2.5 bid. Pt verbalized understanding.

## 2015-08-23 NOTE — Telephone Encounter (Signed)
Returned Caitlin's call from [facility].  She was calling in regards to pts lab work that she needs to have done 08/26/15.  She was asking if it was ok if she drew it, and I advised her that if she could assure that we would get the results, that it would be fine.  She was made aware of lab results and the importance of repeating 08/26/15 with results sent to [place] and she advised that she would just tell the pt she felt it would be better if pt comes to the office for labs.

## 2015-08-23 NOTE — Telephone Encounter (Signed)
Pt wants to know how to take her Eliquis. Should it be once a day or 2 times a day?

## 2015-08-26 ENCOUNTER — Other Ambulatory Visit (INDEPENDENT_AMBULATORY_CARE_PROVIDER_SITE_OTHER): Payer: Medicare Other | Admitting: *Deleted

## 2015-08-26 ENCOUNTER — Telehealth: Payer: Self-pay

## 2015-08-26 DIAGNOSIS — I10 Essential (primary) hypertension: Secondary | ICD-10-CM

## 2015-08-26 LAB — BASIC METABOLIC PANEL
BUN: 62 mg/dL — ABNORMAL HIGH (ref 7–25)
CO2: 25 mmol/L (ref 20–31)
Calcium: 9.2 mg/dL (ref 8.6–10.4)
Chloride: 98 mmol/L (ref 98–110)
Creat: 1.91 mg/dL — ABNORMAL HIGH (ref 0.60–0.93)
Glucose, Bld: 303 mg/dL — ABNORMAL HIGH (ref 65–99)
Potassium: 5.4 mmol/L — ABNORMAL HIGH (ref 3.5–5.3)
Sodium: 133 mmol/L — ABNORMAL LOW (ref 135–146)

## 2015-08-26 NOTE — Addendum Note (Signed)
Addended by: Freada Bergeron on: 08/26/2015 05:51 PM   Modules accepted: Orders

## 2015-08-26 NOTE — Telephone Encounter (Signed)
Eliquis 5 mg approved through 06/21/2016. PA- [id number].

## 2015-08-27 ENCOUNTER — Telehealth: Payer: Self-pay | Admitting: Physician Assistant

## 2015-08-28 ENCOUNTER — Ambulatory Visit (INDEPENDENT_AMBULATORY_CARE_PROVIDER_SITE_OTHER): Payer: Medicare Other | Admitting: Physician Assistant

## 2015-08-28 ENCOUNTER — Ambulatory Visit: Payer: Medicare Other | Admitting: Physician Assistant

## 2015-08-28 ENCOUNTER — Encounter: Payer: Self-pay | Admitting: Physician Assistant

## 2015-08-28 VITALS — BP 160/50 | HR 70 | Ht 59.0 in | Wt 257.0 lb

## 2015-08-28 DIAGNOSIS — I10 Essential (primary) hypertension: Secondary | ICD-10-CM

## 2015-08-28 DIAGNOSIS — I309 Acute pericarditis, unspecified: Secondary | ICD-10-CM | POA: Diagnosis not present

## 2015-08-28 DIAGNOSIS — E875 Hyperkalemia: Secondary | ICD-10-CM | POA: Diagnosis not present

## 2015-08-28 DIAGNOSIS — I480 Paroxysmal atrial fibrillation: Secondary | ICD-10-CM

## 2015-08-28 NOTE — Patient Instructions (Addendum)
Medication Instructions:  Your physician recommends that you continue on your current medications as directed. Please refer to the Current Medication list given to you today.   Labwork: TODAY:  BMET & CBC W/DIFF  Testing/Procedures: NONE ORDERED  Follow-Up: Your physician recommends that you keep your scheduled follow-up appointment WITH DR. Lovena Le   Any Other Special Instructions Will Be Listed Below (If Applicable).     If you need a refill on your cardiac medications before your next appointment, please call your pharmacy.

## 2015-08-28 NOTE — Telephone Encounter (Signed)
Entered in error

## 2015-08-28 NOTE — Progress Notes (Signed)
Cardiology Office Note   Date:  08/28/2015   ID:  JOLYN DESHMUKH, DOB 08/05/1940, MRN [id number]  PCP:  Reginia Naas, MD  Cardiologist:  Dr. Lenore Cordia. Fib follow-up   History of Present Illness:  JAYLYN IYER is a 75 y.o. female with a history of Mobitz type 2 and syncope s/p dual chamber PPM implantation 06/18/2015 ([employer]), chronic anemia, Sjogren's dz., pericarditis, obesity, type 2 DM, CKD, HTN, HLD, and recently diagnosed A. fib with RVR noted on device interrogation presents to clinic for follow-up.  She was admitted in 2013 for syncope but was on high dose BB at the time and so PPM was not indicated.   Admitted 12/26-12/29/17 for syncope and symptomatic Mobitz II and implantation of a MDT dual chamber PPM on 06/18/15 by [name].  Re-admitted 2/14-2/21 for chest pain. D dimer positive but V/Q scan negative for PE. She ruled out for MI. 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 NSAIDS were contraindicated due to erosive esophagitis.  She also was treated for CAP w/ fever, leukocytosis and increased procalcitonin. She was also found to be anemic requiring 2 unit transfusion. FOBT was positive and EGD showed erosive esophagitis and she was started on BID PPI. As above, NSAIDS contraindicated. She also had some hyperkalemia treated with Kayexelte. There was question of volume overload, she had some orthopnea and she was resumed on her home lasix.   Phone notes reveal that she has been found to be tachycardic recently. She has sent a transmission which showed she has been in afib 43 % of the time since 08/06/15. RN discussed with [name], NP and it was decided tostart her on Toprol 25 mg daily to slow her rates down and come in to be seen to discuss long term AC.  I saw her in clinic on 08/22/15 for discussion about long-term anticoagulation. Overall, she was asymptomatic with her atrial fibrillation,  even with rapid ventricular rates. Her heart rate was still 112 in the office so I increased her Toprol-XL 28m to 37.5 mg daily. We checked a CBC which showed her hemoglobin was stable and then started her on Eliquis 2.5 mg twice a day. I also ordered a bmet which showed her creatinine had gone from 1.22--> 2.34 and I advised her to hold her Lasix. This then improved to 1.91.  Today she presents to clinic for follow-up. She sometimes gets a sharp chest pain in her left breast and back. Pain that are unrelated. Not associated with exertion. It is random and lasts only a few minutes. No CP with exertion. She still feels fatigued. No palpitations. She has many questions.    Past Medical History  Diagnosis Date  . Hypertension   . Obesity   . [place]'s [place] ([place]   . Hyperlipidemia   . Anemia   . Diverticulosis   . Intestinal obstruction ([place]   . Hiatal hernia   . Esophageal stricture   . Vitamin B12 deficiency   . Presence of permanent cardiac pacemaker   . Type II diabetes mellitus ([place]   . History of blood transfusion     "@ least w/1st knee OR"  . Osteoarthritis   . Arthritis   . Chronic back pain   . History of gout   . Anxiety   . Depression   . Kidney stones   . Chronic kidney disease (CKD), stage III (moderate)  Past Surgical History  Procedure Laterality Date  . Joint replacement    . Colectomy      for rectovaginal fistula  . Colonoscopy  01/07/2012    Procedure: COLONOSCOPY;  Surgeon: Lafayette Dragon, MD;  [facility];  Service: Endoscopy;  Laterality: N/A;  . Ep implantable device N/A 06/18/2015    Procedure: Pacemaker Implant;  Surgeon: Evans Lance, MD;  Location: [facility];  Service: Cardiovascular;  Laterality: N/A;  . Cholecystectomy open    . Insert / replace / remove pacemaker    . Total knee arthroplasty Bilateral   . Revision total knee arthroplasty Left   . Abdominal hysterectomy    . Knee arthroscopy Right   .  Esophagogastroduodenoscopy (egd) with esophageal dilation    . Esophagogastroduodenoscopy (egd) with propofol N/A 08/12/2015    Procedure: ESOPHAGOGASTRODUODENOSCOPY (EGD) WITH PROPOFOL;  Surgeon: Manus Gunning, MD;  Location: [facility];  Service: Gastroenterology;  Laterality: N/A;     Current Outpatient Prescriptions  Medication Sig Dispense Refill  . ACCU-CHEK AVIVA PLUS test strip 1 each by Other route daily.     Marland Kitchen acetaminophen (TYLENOL) 500 MG tablet Take 1,500 mg by mouth at bedtime.     Marland Kitchen apixaban (ELIQUIS) 2.5 MG TABS tablet Take 1 tablet (2.5 mg total) by mouth 2 (two) times daily. 180 tablet 3  . aspirin EC 81 MG tablet Take 81 mg by mouth daily.    . cholecalciferol (VITAMIN D) 1000 UNITS tablet Take 2,000 Units by mouth at bedtime.    Marland Kitchen denosumab (PROLIA) 60 MG/ML SOLN injection Inject 60 mg into the skin every 6 (six) months. Administer in upper arm, thigh, or abdomen    . docusate sodium (COLACE) 100 MG capsule Take 100 mg by mouth every other day.    . doxazosin (CARDURA) 4 MG tablet Take 4 mg by mouth daily.    . furosemide (LASIX) 40 MG tablet Take 40 mg by mouth daily.    Marland Kitchen gabapentin (NEURONTIN) 300 MG capsule Take 300 mg by mouth 2 (two) times daily.     Marland Kitchen glipiZIDE (GLUCOTROL XL) 10 MG 24 hr tablet Take 10 mg by mouth 2 (two) times daily.    Marland Kitchen HYDROcodone-acetaminophen (NORCO/VICODIN) 5-325 MG tablet Take 1 tablet by mouth every 6 (six) hours as needed for moderate pain.    Marland Kitchen irbesartan (AVAPRO) 300 MG tablet Take 300 mg by mouth daily.    . metoprolol succinate (TOPROL-XL) 25 MG 24 hr tablet Take 1.5 tablets (37.5 mg total) by mouth daily. 90 tablet 3  . montelukast (SINGULAIR) 10 MG tablet Take 10 mg by mouth at bedtime.     . Multiple Vitamin (MULITIVITAMIN WITH MINERALS) TABS Take 1 tablet by mouth daily.    . pantoprazole (PROTONIX) 40 MG tablet Take 1 tablet (40 mg total) by mouth 2 (two) times daily before a meal. 60 tablet 0  . pentoxifylline  (TRENTAL) 400 MG CR tablet Take 400 mg by mouth daily.    . pravastatin (PRAVACHOL) 40 MG tablet Take 40 mg by mouth daily.     . sitaGLIPtin (JANUVIA) 50 MG tablet Take 50 mg by mouth daily.    Marland Kitchen ULORIC 40 MG tablet Take 40 mg by mouth daily.      No current facility-administered medications for this visit.    Allergies:   Norvasc; Glucophage; Lisinopril; Losartan; Morphine and related; Simvastatin; and Trulicity    Social History:  The patient  reports that she quit  smoking about 32 years ago. Her smoking use included Cigarettes. She has a 6 pack-year smoking history. She has never used smokeless tobacco. She reports that she drinks about 1.2 oz of alcohol per week. She reports that she does not use illicit drugs.   Family History:  The patient'sfamily history includes Diabetes in her brother and mother; Hypertension in her sister. There is no history of Colon cancer.    ROS:  Please see the history of present illness.   Otherwise, review of systems are positive for none.   All other systems are reviewed and negative.    PHYSICAL EXAM: VS:  There were no vitals taken for this visit. , BMI There is no weight on file to calculate BMI. GEN: Well nourished, well developed, in no acute distressobese HEENT: normal Neck: no JVD, carotid bruits, or masses Cardiac: irreg irreg, tachy; no murmurs, rubs, or gallops,no edema  Respiratory:  clear to auscultation bilaterally, normal work of breathing GI: soft, nontender, nondistended, + BS MS: no deformity or atrophy Skin: warm and dry, no rash Neuro:  Strength and sensation are intact Psych: euthymic mood, full affect   EKG:  EKG is ordered today. The ekg ordered today demonstrates NSR HR 70 RBBB   Recent Labs: 03/22/2015: TSH 3.10 08/06/2015: ALT 12*; B Natriuretic Peptide 233.6* 08/22/2015: Hemoglobin 9.4*; Platelets 350 08/26/2015: BUN 62*; Creat 1.91*; Potassium 5.4*; Sodium 133*    Lipid Panel No results found for: CHOL, TRIG, HDL,  CHOLHDL, VLDL, LDLCALC, LDLDIRECT    Wt Readings from Last 3 Encounters:  08/22/15 252 lb (114.306 kg)  08/12/15 270 lb (122.471 kg)  06/19/15 249 lb 9.6 oz (113.218 kg)      Other studies Reviewed: Additional studies/ records that were reviewed today include: 2D ECHO Review of the above records demonstrates:   2D ECHO 08/06/2015 LV EF: 60% -  65% Study Conclusions - Left ventricle: The cavity size was normal. Systolic function was normal. The estimated ejection fraction was in the range of 60% to 65%. Wall motion was normal; there were no regional wall motion abnormalities. - Aortic valve: Poorly visualized. - Right ventricle: Pacer wire or catheter noted in right ventricle. - Right atrium: Pacer wire or catheter noted in right atrium. - Atrial septum: There was increased thickness of the septum, consistent with lipomatous hypertrophy. - Pulmonary arteries: PA peak pressure: 50 mm Hg (S). Impressions: - The right ventricular systolic pressure was increased consistent with moderate pulmonary hypertension.    ASSESSMENT AND PLAN:  HARRIS KISTLER is a 75 y.o. female with a history of Mobitz type 2 and syncope s/p dual chamber PPM implantation 06/18/2015 ([employer]), chronic anemia, Sjogren's dz., obesity, type 2 DM, CKD, HTN, HLD who presnts to clinic for evaluation of recently diagnosed afib with RVR on pacemaker interrogation.   Newly diagnosed afib with RVR: She spontaneously converted to NSR. Continue Toprol XL 37.23m daily.  -- Recently started on Eliquis 2.5100mBID (GFR 50) CHADSVASC score of at least 4 (HTN, age, F sex, DM). CBC stable with a hemoglobin 9.4 last week. Will recheck CBC today.   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   CKD: Creat up to 2.34. It improved after holding lasix 2.34--> 1.91. Will check BMET today and have her follow up with Dr. GoSteve Rattlerext week. If kidney function I will let her restart  her lasix and follow up with nephrology.   HTN: BP well controlled at home on current regimen (  home health RN takes 2x weeks and PT comes 2x week and they always take her BP and it has been under good control, even low sometimes) She rushed to get here today which is why it is elevated. Continue Irbesartan 38m daily and Toprol XL 37.58mdaily  Anemia of chronic disease: baseline hemoglobin in 8s. Recently was FOBT + and required 2 U transfusion while admitted. Repeat CBC last week with Hg up to 9.4.  Will check CBC today now that she has been on Eliquis for 1 week.    Obesity: discussed how afib is best managed by weight loss. She said she has been trying to loose weight her whole life and cannot ever do it.   Erosive esophagitis: continue PPI.   Chest and back pain: atypical. I offered her a stress test but she is not that worried about it.   Current medicines are reviewed at length with the patient today.  The patient does not have concerns regarding medicines.  The following changes have been made:  none   Labs/ tests ordered today include:  No orders of the defined types were placed in this encounter.     Disposition:   FU with Dr. TaLovena LeSigned, THEileen StanfordPA-C  08/28/2015 10:43 AM    [place]  [id number]: [phone]: [phone]

## 2015-08-29 ENCOUNTER — Telehealth: Payer: Self-pay | Admitting: *Deleted

## 2015-08-29 DIAGNOSIS — D649 Anemia, unspecified: Secondary | ICD-10-CM

## 2015-08-29 LAB — CBC WITH DIFFERENTIAL/PLATELET
Basophils Absolute: 0 10*3/uL (ref 0.0–0.1)
Basophils Relative: 0 % (ref 0–1)
Eosinophils Absolute: 0.1 10*3/uL (ref 0.0–0.7)
Eosinophils Relative: 2 % (ref 0–5)
HCT: 27.6 % — ABNORMAL LOW (ref 36.0–46.0)
Hemoglobin: 8.6 g/dL — ABNORMAL LOW (ref 12.0–15.0)
Lymphocytes Relative: 12 % (ref 12–46)
Lymphs Abs: 0.9 10*3/uL (ref 0.7–4.0)
MCH: 26.7 pg (ref 26.0–34.0)
MCHC: 31.2 g/dL (ref 30.0–36.0)
MCV: 85.7 fL (ref 78.0–100.0)
MPV: 10.3 fL (ref 8.6–12.4)
Monocytes Absolute: 0.5 10*3/uL (ref 0.1–1.0)
Monocytes Relative: 7 % (ref 3–12)
Neutro Abs: 5.8 10*3/uL (ref 1.7–7.7)
Neutrophils Relative %: 79 % — ABNORMAL HIGH (ref 43–77)
Platelets: 290 10*3/uL (ref 150–400)
RBC: 3.22 MIL/uL — ABNORMAL LOW (ref 3.87–5.11)
RDW: 16 % — ABNORMAL HIGH (ref 11.5–15.5)
WBC: 7.4 10*3/uL (ref 4.0–10.5)

## 2015-08-29 LAB — BASIC METABOLIC PANEL
BUN: 52 mg/dL — ABNORMAL HIGH (ref 7–25)
CO2: 26 mmol/L (ref 20–31)
Calcium: 9.4 mg/dL (ref 8.6–10.4)
Chloride: 103 mmol/L (ref 98–110)
Creat: 1.66 mg/dL — ABNORMAL HIGH (ref 0.60–0.93)
Glucose, Bld: 218 mg/dL — ABNORMAL HIGH (ref 65–99)
Potassium: 5 mmol/L (ref 3.5–5.3)
Sodium: 137 mmol/L (ref 135–146)

## 2015-08-29 NOTE — Telephone Encounter (Signed)
Pt has been made aware of her lab results and that it was ok to resume her Lasix.  Pt was also advised to f/u with Dr. Steve Rattler.  She has been made aware that her hemoglobin is down again and that we will need a repeat CBC in 1 week.  She will have her hhn call me back with how to get an order to her to draw at home. Order put in [facility] for now. Pt verbalized understanding.

## 2015-08-29 NOTE — Telephone Encounter (Signed)
-----  Message from Eileen Stanford, PA-C sent at 08/29/2015  7:29 AM EST ----- Creat looks much better. Please tell her she can start taking her lasix again and follow up with Dr. Steve Rattler. Her hemoglobin is down again. We will need to get another CBC in 1 week to ensure it is not dropping further on Eliquis. Thank you

## 2015-09-06 ENCOUNTER — Encounter: Payer: Self-pay | Admitting: Internal Medicine

## 2015-09-09 ENCOUNTER — Telehealth: Payer: Self-pay | Admitting: Internal Medicine

## 2015-09-09 NOTE — Telephone Encounter (Signed)
Spoke with [facility] and let hfer know the labs are on Dr Tanna Furry desktop for review.  Her Hgb has dropped but is still at baseline it looks for her.  The pain she is having seems to be musculoskeletal per the [facility].  I let her know I would discuss with [name] and get back with the patient.  She also states that she is scheduled to see her only one more visit and the patient seemed a bit anxious about this.

## 2015-09-09 NOTE — Telephone Encounter (Signed)
New message   Home health RN calling:  Did you receive STAT blood work that she took on 09-05-15    Pt is fine but she has a pain level at 9-10 neck shoulder and down her back

## 2015-09-12 ENCOUNTER — Encounter: Payer: Self-pay | Admitting: Gastroenterology

## 2015-09-12 ENCOUNTER — Other Ambulatory Visit (INDEPENDENT_AMBULATORY_CARE_PROVIDER_SITE_OTHER): Payer: Medicare Other

## 2015-09-12 ENCOUNTER — Ambulatory Visit (INDEPENDENT_AMBULATORY_CARE_PROVIDER_SITE_OTHER): Payer: Medicare Other | Admitting: Gastroenterology

## 2015-09-12 VITALS — BP 128/74 | HR 64 | Ht 58.27 in | Wt 247.5 lb

## 2015-09-12 DIAGNOSIS — D500 Iron deficiency anemia secondary to blood loss (chronic): Secondary | ICD-10-CM | POA: Diagnosis not present

## 2015-09-12 DIAGNOSIS — K5900 Constipation, unspecified: Secondary | ICD-10-CM | POA: Diagnosis not present

## 2015-09-12 DIAGNOSIS — K2900 Acute gastritis without bleeding: Secondary | ICD-10-CM

## 2015-09-12 DIAGNOSIS — D649 Anemia, unspecified: Secondary | ICD-10-CM

## 2015-09-12 DIAGNOSIS — Z7901 Long term (current) use of anticoagulants: Secondary | ICD-10-CM | POA: Diagnosis not present

## 2015-09-12 DIAGNOSIS — K2960 Other gastritis without bleeding: Secondary | ICD-10-CM

## 2015-09-12 LAB — IBC PANEL
Iron: 16 ug/dL — ABNORMAL LOW (ref 42–145)
Saturation Ratios: 3.8 % — ABNORMAL LOW (ref 20.0–50.0)
Transferrin: 299 mg/dL (ref 212.0–360.0)

## 2015-09-12 MED ORDER — POLYETHYLENE GLYCOL 3350 17 GM/SCOOP PO POWD: ORAL | Status: DC

## 2015-09-12 MED ORDER — PANTOPRAZOLE SODIUM 40 MG PO TBEC: 40.0000 mg | DELAYED_RELEASE_TABLET | Freq: Every day | ORAL | Status: DC

## 2015-09-12 NOTE — Progress Notes (Signed)
HPI :  75 y/o female here for follow up. She was previously seen by our service in the hospital in the mid February, when she was admitted with pericarditis and noted to have drop in Hgb from baseline and (+) occult positive stools. She had previously had a colonoscopy in 2013 with diverticulosis only, otherwise normal. EGD done during her admission showed erosive gastris in the setting of steroids. She was on aspirin already but other NSAIDs held given her CKD.   Since her discharge she was diagnosed with atrial fibrillation, and she was placed on Eliquis. She does not have any blood in her stools. She passes normal stools, brown in color. She is not taking iron. She is not having any abdominal pains. She has borboygmi which she hears from time to time but no abdominal pain. She has a BM once every 3 days, and has some baseline constipation. She has had this ongoing constipation since she has had a rectovaginal fistula repaired, about 6 years ago, thought to be secondary to diverticulitis. She has never been told she had Crohns disease. She takes protonix 77m BID and also takes aspirin 846mdaily at this time. She has questions about long term risks of PPIs. She has been on low dose protonix for several years. She denies any heartburn that bothers her at this time.    She otherwise has had her Hgb checked every few weeks. Last check was 8.8, up from 8.4. On review of her chart she has had a normocytic anemia with baseline Hgb 8-9s, with some as low as 6-7s since 2009 at least. She has had a remote hematology evaluation but not recently.   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      Past Medical History  Diagnosis Date  . Hypertension   . Obesity   . Sjogren's disease ([facility]  . Hyperlipidemia   .  Anemia   . Diverticulosis   . Intestinal obstruction ([place]  . Hiatal hernia   . Esophageal stricture   . Vitamin B12 deficiency   . Presence of permanent cardiac pacemaker   . Type II diabetes mellitus (HCS[place]  . History of blood transfusion     "@ least w/1st knee OR"  . Osteoarthritis   . Arthritis   . Chronic back pain   . History of gout   . Anxiety   . Depression   . Kidney stones   . Chronic kidney disease (CKD), stage III (moderate)   . Atrial fibrillation (HCParis  . Pericarditis   . Gastritis      Past Surgical History  Procedure Laterality Date  . Joint replacement    . Colectomy      for rectovaginal fistula  . Colonoscopy  01/07/2012    Procedure: COLONOSCOPY;  Surgeon: DoLafayette DragonMD;  [facility]: [facility];  Service: Endoscopy;  Laterality: N/A;  . Ep implantable device N/A 06/18/2015    Procedure: Pacemaker Implant;  Surgeon: GrEvans LanceMD;  [facility];  Service: Cardiovascular;  Laterality: N/A;  . Cholecystectomy open    . Insert / replace / remove pacemaker    . Total knee arthroplasty Bilateral   . Revision total knee arthroplasty Left   . Abdominal hysterectomy    . Knee arthroscopy Right   .  Esophagogastroduodenoscopy (egd) with esophageal dilation    . Esophagogastroduodenoscopy (egd) with propofol N/A 08/12/2015    Procedure: ESOPHAGOGASTRODUODENOSCOPY (EGD) WITH PROPOFOL;  Surgeon: Manus Gunning, MD;  Location: [place];  Service: Gastroenterology;  Laterality: N/A;   Family History  Problem Relation Age of Onset  . Diabetes Mother   . Diabetes Brother   . Colon cancer Neg Hx   . Hypertension Sister   . Heart attack Neg Hx   . Stroke Neg Hx    Social History  Substance Use Topics  . Smoking status: Former Smoker -- 2.00 packs/day for 3 years    Types: Cigarettes    Quit date: 06/23/1983  . Smokeless tobacco: Never Used  . Alcohol Use: 1.2 oz/week    2 Glasses of wine per week     Comment: occ    Current Outpatient Prescriptions  Medication Sig Dispense Refill  . ACCU-CHEK AVIVA PLUS test strip 1 each by Other route daily.     Marland Kitchen acetaminophen (TYLENOL) 500 MG tablet Take 1,500 mg by mouth at bedtime.     Marland Kitchen apixaban (ELIQUIS) 2.5 MG TABS tablet Take 1 tablet (2.5 mg total) by mouth 2 (two) times daily. 180 tablet 3  . aspirin EC 81 MG tablet Take 81 mg by mouth daily.    . cholecalciferol (VITAMIN D) 1000 UNITS tablet Take 2,000 Units by mouth at bedtime.    Marland Kitchen denosumab (PROLIA) 60 MG/ML SOLN injection Inject 60 mg into the skin every 6 (six) months. Administer in upper arm, thigh, or abdomen    . docusate sodium (COLACE) 100 MG capsule Take 100 mg by mouth daily.     Marland Kitchen doxazosin (CARDURA) 4 MG tablet Take 4 mg by mouth daily.    Marland Kitchen gabapentin (NEURONTIN) 300 MG capsule Take 300 mg by mouth 2 (two) times daily.     Marland Kitchen glipiZIDE (GLUCOTROL XL) 10 MG 24 hr tablet Take 10 mg by mouth 2 (two) times daily.    Marland Kitchen HYDROcodone-acetaminophen (NORCO/VICODIN) 5-325 MG tablet Take 1 tablet by mouth every 6 (six) hours as needed for moderate pain.    Marland Kitchen irbesartan (AVAPRO) 300 MG tablet Take 300 mg by mouth daily.    . metoprolol succinate (TOPROL-XL) 25 MG 24 hr tablet Take 1.5 tablets (37.5 mg total) by mouth daily. 90 tablet 3  . montelukast (SINGULAIR) 10 MG tablet Take 10 mg by mouth at bedtime.     . Multiple Vitamin (MULITIVITAMIN WITH MINERALS) TABS Take 1 tablet by mouth daily.    . pantoprazole (PROTONIX) 40 MG tablet Take 1 tablet (40 mg total) by mouth daily. 30 tablet 1  . pentoxifylline (TRENTAL) 400 MG CR tablet Take 400 mg by mouth daily.    . pravastatin (PRAVACHOL) 40 MG tablet Take 40 mg by mouth daily.     . sitaGLIPtin (JANUVIA) 50 MG tablet Take 50 mg by mouth daily.    Marland Kitchen ULORIC 40 MG tablet Take 40 mg by mouth daily.     . polyethylene glycol powder (GLYCOLAX/MIRALAX) powder miz 17 grams in 8 oz of water up to two times daily 255 g 3   No current facility-administered  medications for this visit.   Allergies  Allergen Reactions  . Norvasc [Amlodipine Besylate] Shortness Of Breath  . Glucophage [Metformin Hydrochloride] Other (See Comments)    "increases creatinine"  . Lisinopril     Headache   . Losartan     Headache   . Morphine And Related Other (See Comments)    "  hallucinations"  . Simvastatin Other (See Comments)    Body aches  . Trulicity [Dulaglutide] Other (See Comments)    Severe mood swings     Review of Systems: All systems reviewed and negative except where noted in HPI.   Lab Results  Component Value Date   WBC 7.4 08/28/2015   HGB 8.6* 08/28/2015   HCT 27.6* 08/28/2015   MCV 85.7 08/28/2015   PLT 290 08/28/2015    Lab Results  Component Value Date   CREATININE 1.66* 08/28/2015   BUN 52* 08/28/2015   NA 137 08/28/2015   K 5.0 08/28/2015   CL 103 08/28/2015   CO2 26 08/28/2015    Lab Results  Component Value Date   ALT 12* 08/06/2015   AST 16 08/06/2015   ALKPHOS 57 08/06/2015   BILITOT 0.6 08/06/2015     Physical Exam: BP 128/74 mmHg  Pulse 64  Ht 4' 10.27" (1.48 m)  Wt 247 lb 8 oz (112.265 kg)  BMI 51.25 kg/m2 Constitutional: Pleasant, obese female in no acute distress. HEENT: Normocephalic and atraumatic. Conjunctivae are normal. No scleral icterus. Neck supple.  Cardiovascular: irregularly irregular Pulmonary/chest: Effort normal and breath sounds normal. No wheezing, rales or rhonchi. Abdominal: Soft, protuberant nontender. Bowel sounds active throughout. There are no masses palpable.  Extremities: no edema Lymphadenopathy: No cervical adenopathy noted. Neurological: Alert and oriented to person place and time. Skin: Skin is warm and dry. No rashes noted. Psychiatric: Normal mood and affect. Behavior is normal.   ASSESSMENT AND PLAN: 76 y/o female with multiple medical problems as outlined above, recently admitted with pericarditis and now with new diagnosis of atrial fibrillation on Eliquis,  here to follow up for anemia. She has had a longstanding normocytic anemia of unclear etiology dating back to at least 2009 in [place]. Baseline Hgb in the 8-9s range. She had a drop during hospitalization to 6s, without any overt bleeding but did have a positive OB stool and was given a transfusion. EGD was performed showing erosive reactive gastritis which could have played a role while in the setting of steroid use. Her protonix was increased to twice daily for the past month or so and her Hgb has been stable while on Eliquis. No overt bleeding. Her last colonoscopy in 2013 at the time of her anemia was unremarkable.   At this time I will check iron studies and B12/folate. Given the chronicity of her anemia, I think this is unlikely to be from chronic GI tract loss, and it appears stable at this time compared to remote labs. If no obvious iron deficiency, will refer to Hematology for further evaluation. If she is iron deficient we may consider a repeat colonoscopy. Otherwise, given her history of Eliquis and aspirin use, and erosive gastritis on recent EGD, I do think she warrants long term PPI prophylaxis in this setting to reduce the risk of GI bleeding if she requires these medications long term. We discussed the long term risks of PPIs otherwise. The risks of long term PPIs with current data include increased risk for chronic kidney disease, increased risk of fracture, increased risk of C diff, increased risk of pneumonia (short term usage), potentially increased risk of B12 / calcium deficiency, and rare risk of hypomagnesemia. Recent studies have also shown an association with increased risk of dementia and cardiovascular outcomes including stroke. These studies have showed an association between PPIs and several of these outcomes but no evidence of causality. The patient was counseled to use the  lowest daily use of PPI needed to control symptoms, and at this time will decrease to 37m once daily. She has  had chronic kidney disease which is stable and would continue to monitor periodically. Otherwise recommend treating her constipation with Miralax and she can titrate up as needed and see if this helps.   I will contact her with blood work and discuss recommendations. She agreed.   SCarolina Cellar MD [facility] Pager [phone]

## 2015-09-12 NOTE — Patient Instructions (Signed)
We have sent the following medications to your pharmacy for you to pick up at your convenience:Miralax.   Decrease your Protonix to 40 mg one tablet by mouth once daily. A new prescription has been sen to your pharmacy.   Your physician has requested that you go to the basement for lab work before leaving today.

## 2015-09-13 LAB — FERRITIN: Ferritin: 41.3 ng/mL (ref 10.0–291.0)

## 2015-09-13 LAB — VITAMIN B12: Vitamin B-12: 370 pg/mL (ref 211–911)

## 2015-09-17 ENCOUNTER — Encounter: Payer: Self-pay | Admitting: Hematology

## 2015-09-17 ENCOUNTER — Other Ambulatory Visit: Payer: Self-pay | Admitting: *Deleted

## 2015-09-17 DIAGNOSIS — D649 Anemia, unspecified: Secondary | ICD-10-CM

## 2015-09-18 ENCOUNTER — Telehealth: Payer: Self-pay | Admitting: *Deleted

## 2015-09-18 ENCOUNTER — Ambulatory Visit (INDEPENDENT_AMBULATORY_CARE_PROVIDER_SITE_OTHER): Payer: Medicare Other | Admitting: Internal Medicine

## 2015-09-18 ENCOUNTER — Encounter: Payer: Self-pay | Admitting: Internal Medicine

## 2015-09-18 ENCOUNTER — Encounter: Payer: Self-pay | Admitting: Hematology

## 2015-09-18 ENCOUNTER — Encounter: Payer: Medicare Other | Admitting: Internal Medicine

## 2015-09-18 VITALS — BP 110/60 | HR 74 | Ht 58.5 in | Wt 246.8 lb

## 2015-09-18 DIAGNOSIS — I480 Paroxysmal atrial fibrillation: Secondary | ICD-10-CM

## 2015-09-18 LAB — CUP PACEART INCLINIC DEVICE CHECK
Battery Remaining Longevity: 117 mo
Battery Voltage: 3.05 V
Brady Statistic AP VP Percent: 6.11 %
Brady Statistic AP VS Percent: 1.06 %
Brady Statistic AS VP Percent: 6.89 %
Brady Statistic AS VS Percent: 85.95 %
Brady Statistic RA Percent Paced: 7.17 %
Brady Statistic RV Percent Paced: 13 %
Date Time Interrogation Session: 20170329153822
Implantable Lead Implant Date: 20161227
Implantable Lead Implant Date: 20161227
Implantable Lead Location: 753859
Implantable Lead Location: 753860
Implantable Lead Model: 5076
Implantable Lead Model: 5076
Lead Channel Impedance Value: 304 Ohm
Lead Channel Impedance Value: 380 Ohm
Lead Channel Impedance Value: 399 Ohm
Lead Channel Impedance Value: 494 Ohm
Lead Channel Pacing Threshold Amplitude: 0.625 V
Lead Channel Pacing Threshold Amplitude: 0.75 V
Lead Channel Pacing Threshold Pulse Width: 0.4 ms
Lead Channel Pacing Threshold Pulse Width: 0.4 ms
Lead Channel Sensing Intrinsic Amplitude: 11.25 mV
Lead Channel Sensing Intrinsic Amplitude: 3 mV
Lead Channel Setting Pacing Amplitude: 2 V
Lead Channel Setting Pacing Amplitude: 2.5 V
Lead Channel Setting Pacing Pulse Width: 0.4 ms
Lead Channel Setting Sensing Sensitivity: 2.8 mV

## 2015-09-18 NOTE — Telephone Encounter (Signed)
Received a fax from [facility] with [place] on 10/09/15 at 10:30 AM with Dr. Irene Limbo. Left a message for patient to call back.

## 2015-09-18 NOTE — Progress Notes (Signed)
HPI Tricia Gonzales returns today for followup. 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 In the interim, she has had some PAF. She also notes back and chest pain. She was thought to have pericarditis. This has resolved. She feels her heart fluttering at times.   Allergies  Allergen Reactions  . Norvasc [Amlodipine Besylate] Shortness Of Breath  . Glucophage [Metformin Hydrochloride] Other (See Comments)    "increases creatinine"  . Lisinopril     Headache   . Losartan     Headache   . Morphine And Related Other (See Comments)    "hallucinations"  . Simvastatin Other (See Comments)    Body aches  . Trulicity [Dulaglutide] Other (See Comments)    Severe mood swings     Current Outpatient Prescriptions  Medication Sig Dispense Refill  . ACCU-CHEK AVIVA PLUS test strip 1 each by Other route daily.     Marland Kitchen acetaminophen (TYLENOL) 500 MG tablet Take 1,500 mg by mouth at bedtime.     Marland Kitchen apixaban (ELIQUIS) 2.5 MG TABS tablet Take 1 tablet (2.5 mg total) by mouth 2 (two) times daily. 180 tablet 3  . aspirin EC 81 MG tablet Take 81 mg by mouth daily.    . cholecalciferol (VITAMIN D) 1000 UNITS tablet Take 2,000 Units by mouth at bedtime.    Marland Kitchen denosumab (PROLIA) 60 MG/ML SOLN injection Inject 60 mg into the skin every 6 (six) months. Administer in upper arm, thigh, or abdomen    . docusate sodium (COLACE) 100 MG capsule Take 100 mg by mouth daily.     Marland Kitchen doxazosin (CARDURA) 4 MG tablet Take 4 mg by mouth daily.    Marland Kitchen gabapentin (NEURONTIN) 300 MG capsule Take 300 mg by mouth 2 (two) times daily.     Marland Kitchen glipiZIDE (GLUCOTROL XL) 10 MG 24 hr tablet Take 10 mg by mouth 2 (two) times daily.    Marland Kitchen HYDROcodone-acetaminophen (NORCO/VICODIN) 5-325 MG tablet Take 1 tablet by mouth every 6 (six) hours as needed for moderate pain.    Marland Kitchen irbesartan (AVAPRO) 300 MG tablet Take 300 mg by mouth daily.    Marland Kitchen JANUVIA 100 MG  tablet Take 50 mg by mouth daily.    . metoprolol succinate (TOPROL-XL) 25 MG 24 hr tablet Take 1.5 tablets (37.5 mg total) by mouth daily. 90 tablet 3  . montelukast (SINGULAIR) 10 MG tablet Take 10 mg by mouth at bedtime.     . Multiple Vitamin (MULITIVITAMIN WITH MINERALS) TABS Take 1 tablet by mouth daily.    . pantoprazole (PROTONIX) 40 MG tablet Take 1 tablet (40 mg total) by mouth daily. 30 tablet 1  . pentoxifylline (TRENTAL) 400 MG CR tablet Take 400 mg by mouth daily.    . polyethylene glycol powder (GLYCOLAX/MIRALAX) powder miz 17 grams in 8 oz of water up to two times daily 255 g 3  . pravastatin (PRAVACHOL) 40 MG tablet Take 40 mg by mouth daily.     Marland Kitchen ULORIC 40 MG tablet Take 40 mg by mouth daily.      No current facility-administered medications for this visit.     Past Medical History  Diagnosis Date  . Hypertension   . Obesity   . Sjogren's disease ([place])   . Hyperlipidemia   . Anemia   . Diverticulosis   . Intestinal obstruction ([place])   . Hiatal hernia   .  Esophageal stricture   . Vitamin B12 deficiency   . Presence of permanent cardiac pacemaker   . Type II diabetes mellitus ([place])   . History of blood transfusion     "@ least w/1st knee OR"  . Osteoarthritis   . Arthritis   . Chronic back pain   . History of gout   . Anxiety   . Depression   . Kidney stones   . Chronic kidney disease (CKD), stage III (moderate)   . Atrial fibrillation ([place])   . Pericarditis   . Gastritis     ROS:   All systems reviewed and negative except as noted in the HPI.   Past Surgical History  Procedure Laterality Date  . Joint replacement    . Colectomy      for rectovaginal fistula  . Colonoscopy  01/07/2012    Procedure: COLONOSCOPY;  Surgeon: Lafayette Dragon, MD;  Location: [facility];  Service: Endoscopy;  Laterality: N/A;  . Ep implantable device N/A 06/18/2015    Procedure: Pacemaker Implant;  Surgeon: Evans Lance, MD;  Location: [facility];  Service:  Cardiovascular;  Laterality: N/A;  . Cholecystectomy open    . Insert / replace / remove pacemaker    . Total knee arthroplasty Bilateral   . Revision total knee arthroplasty Left   . Abdominal hysterectomy    . Knee arthroscopy Right   . Esophagogastroduodenoscopy (egd) with esophageal dilation    . Esophagogastroduodenoscopy (egd) with propofol N/A 08/12/2015    Procedure: ESOPHAGOGASTRODUODENOSCOPY (EGD) WITH PROPOFOL;  Surgeon: Manus Gunning, MD;  Location: [place];  Service: Gastroenterology;  Laterality: N/A;     Family History  Problem Relation Age of Onset  . Diabetes Mother   . Diabetes Brother   . Colon cancer Neg Hx   . Hypertension Sister   . Heart attack Neg Hx   . Stroke Neg Hx      Social History   Social History  . Marital Status: Divorced    Spouse Name: N/A  . Number of Children: 2  . Years of Education: N/A   Occupational History  . Not on file.   Social History Main Topics  . Smoking status: Former Smoker -- 2.00 packs/day for 3 years    Types: Cigarettes    Quit date: 06/23/1983  . Smokeless tobacco: Never Used  . Alcohol Use: 1.2 oz/week    2 Glasses of wine per week     Comment: occ  . Drug Use: No  . Sexual Activity: No   Other Topics Concern  . Not on file   Social History Narrative     BP 110/60 mmHg  Pulse 74  Ht 4' 10.5" (1.486 m)  Wt 246 lb 12.8 oz (111.948 kg)  BMI 50.70 kg/m2  Physical Exam:  Well appearing 75 yo woman, morbidly obese, NAD HEENT: Unremarkable Neck:  7 cm JVD, no thyromegally Lymphatics:  No adenopathy Back:  No CVA tenderness Lungs:  Clear though breath sounds are decreased. HEART:  Regular rate rhythm, no murmurs, no rubs, no clicks Abd:  soft, positive bowel sounds, no organomegally, no rebound, no guarding Ext:  2 plus pulses, no edema, no cyanosis, no clubbing Skin:  No rashes no nodules Neuro:  CN II through XII intact, motor grossly intact  EKG - NSR with RBBB  DEVICE  Normal  device function.  See PaceArt for details.   Assess/Plan: 1. PAF - she has both fib and flutter. She is  maintaining NSR. She will continue her current meds.  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 PPM - her medtronic DDD PM is working normally. Will recheck in several months. 4. Chronic diastolic heart failure - she is minimally overloaded today. I have encouraged the patient to lose weight and eat less and reduce her salt intake. No change in cardiac meds.

## 2015-09-18 NOTE — Patient Instructions (Addendum)
Medication Instructions:  Your physician recommends that you continue on your current medications as directed. Please refer to the Current Medication list given to you today. May take extra Toprol as needed if heart is racing  Labwork: none  Testing/Procedures: none  Follow-Up:  Remote monitoring is used to monitor your Pacemaker of ICD from home. This monitoring reduces the number of office visits required to check your device to one time per year. It allows [place] to keep an eye on the functioning of your device to ensure it is working properly. You are scheduled for a device check from home on 12/18/15. You may send your transmission at any time that day. If you have a wireless device, the transmission will be sent automatically. After your physician reviews your transmission, you will receive a postcard with your next transmission date.   Your physician wants you to follow-up in: 9 months.  You will receive a reminder letter in the mail two months in advance. If you don't receive a letter, please call our office to schedule the follow-up appointment.   Any Other Special Instructions Will Be Listed Below (If Applicable).     If you need a refill on your cardiac medications before your next appointment, please call your pharmacy.

## 2015-09-18 NOTE — Telephone Encounter (Signed)
Patient notified of appointment date and time.

## 2015-09-24 ENCOUNTER — Telehealth: Payer: Self-pay | Admitting: *Deleted

## 2015-09-24 NOTE — Telephone Encounter (Signed)
Rose, can you initiate a PA for a Prolia inj? Pt is due to have it on 10/12/15. Thank you so much!!

## 2015-09-25 ENCOUNTER — Telehealth: Payer: Self-pay | Admitting: Oncology

## 2015-09-25 ENCOUNTER — Other Ambulatory Visit: Payer: Self-pay | Admitting: *Deleted

## 2015-09-25 DIAGNOSIS — Z862 Personal history of diseases of the blood and blood-forming organs and certain disorders involving the immune mechanism: Secondary | ICD-10-CM

## 2015-09-25 NOTE — Telephone Encounter (Signed)
Contacted pt regarding appt for 4/7 at 11 am per Md

## 2015-09-27 ENCOUNTER — Encounter: Payer: Self-pay | Admitting: Oncology

## 2015-09-27 ENCOUNTER — Ambulatory Visit (HOSPITAL_BASED_OUTPATIENT_CLINIC_OR_DEPARTMENT_OTHER): Payer: Medicare Other | Admitting: Oncology

## 2015-09-27 ENCOUNTER — Telehealth: Payer: Self-pay | Admitting: Oncology

## 2015-09-27 ENCOUNTER — Other Ambulatory Visit (HOSPITAL_BASED_OUTPATIENT_CLINIC_OR_DEPARTMENT_OTHER): Payer: Medicare Other

## 2015-09-27 VITALS — BP 153/68 | HR 77 | Temp 97.5°F | Resp 18 | Ht 58.5 in | Wt 246.6 lb

## 2015-09-27 DIAGNOSIS — E119 Type 2 diabetes mellitus without complications: Secondary | ICD-10-CM

## 2015-09-27 DIAGNOSIS — N189 Chronic kidney disease, unspecified: Secondary | ICD-10-CM

## 2015-09-27 DIAGNOSIS — M3500 Sicca syndrome, unspecified: Secondary | ICD-10-CM

## 2015-09-27 DIAGNOSIS — M1990 Unspecified osteoarthritis, unspecified site: Secondary | ICD-10-CM

## 2015-09-27 DIAGNOSIS — D649 Anemia, unspecified: Secondary | ICD-10-CM

## 2015-09-27 DIAGNOSIS — N183 Chronic kidney disease, stage 3 unspecified: Secondary | ICD-10-CM

## 2015-09-27 DIAGNOSIS — I4891 Unspecified atrial fibrillation: Secondary | ICD-10-CM | POA: Diagnosis not present

## 2015-09-27 DIAGNOSIS — Z862 Personal history of diseases of the blood and blood-forming organs and certain disorders involving the immune mechanism: Secondary | ICD-10-CM

## 2015-09-27 LAB — CBC WITH DIFFERENTIAL/PLATELET
BASO%: 0.1 % (ref 0.0–2.0)
Basophils Absolute: 0 10*3/uL (ref 0.0–0.1)
EOS%: 0.9 % (ref 0.0–7.0)
Eosinophils Absolute: 0.1 10*3/uL (ref 0.0–0.5)
HCT: 25.2 % — ABNORMAL LOW (ref 34.8–46.6)
HGB: 7.7 g/dL — ABNORMAL LOW (ref 11.6–15.9)
LYMPH%: 7.6 % — ABNORMAL LOW (ref 14.0–49.7)
MCH: 26.6 pg (ref 25.1–34.0)
MCHC: 30.6 g/dL — ABNORMAL LOW (ref 31.5–36.0)
MCV: 86.9 fL (ref 79.5–101.0)
MONO#: 0.5 10*3/uL (ref 0.1–0.9)
MONO%: 6.1 % (ref 0.0–14.0)
NEUT#: 7.2 10*3/uL — ABNORMAL HIGH (ref 1.5–6.5)
NEUT%: 85.3 % — ABNORMAL HIGH (ref 38.4–76.8)
Platelets: 311 10*3/uL (ref 145–400)
RBC: 2.9 10*6/uL — ABNORMAL LOW (ref 3.70–5.45)
RDW: 15.4 % — ABNORMAL HIGH (ref 11.2–14.5)
WBC: 8.5 10*3/uL (ref 3.9–10.3)
lymph#: 0.6 10*3/uL — ABNORMAL LOW (ref 0.9–3.3)

## 2015-09-27 LAB — RETICULOCYTES (CHCC)
Immature Retic Fract: 9.4 % (ref 1.60–10.00)
RBC: 2.88 10*6/uL — ABNORMAL LOW (ref 3.70–5.45)
Retic %: 1.98 % (ref 0.70–2.10)
Retic Ct Abs: 57.02 10*3/uL (ref 33.70–90.70)

## 2015-09-27 LAB — DRAW EXTRA CLOT TUBE

## 2015-09-27 LAB — CHCC SMEAR

## 2015-09-27 MED ORDER — FERROUS SULFATE 325 (65 FE) MG PO TBEC: 325.0000 mg | DELAYED_RELEASE_TABLET | Freq: Two times a day (BID) | ORAL | Status: DC

## 2015-09-27 MED ORDER — EPOETIN ALFA 40000 UNIT/ML IJ SOLN
40000.0000 [IU] | Freq: Once | INTRAMUSCULAR | Status: AC
  Administered 2015-09-27: 40000 [IU] via SUBCUTANEOUS
  Filled 2015-09-27: qty 1

## 2015-09-27 NOTE — Progress Notes (Signed)
[facility] New Patient Consult   Referring MD: Carol Ada, [place], [place] [address]   Tricia Gonzales 75 y.o.  10-Nov-1940    Reason for Referral: Anemia   HPI: Ms. Fouch has a chronic history of anemia. I saw her in the past, last in 2005. I felt the anemia was most likely related to chronic renal insufficiency. The anemia did not correct with vitamin B 12 replacement.  She was admitted to the hospital in February with pericarditis and noted to have a hemoglobin of 7.8 with an MCV of 86.4 on hospital admission. She was transfused with packed red blood cells on 08/07/2015.  The stool was Hemoccult positive. She was referred to [name]. She denies bleeding. She was taken to an upper endoscopy 08/12/2015. Erosive gastritis was noted in the distal stomach. The duodenum appeared normal. No stigmata of bleeding. Biopsy from the gastric antrum revealed reactive gastropathy. No H. Pylori.  She had a colonoscopy in 2013 with diverticulosis.  She was started on Eliquis 4 atrial fibrillation after the hospital admission.  On 09/12/2015 the ferritin returned at 41.3 with a percent iron saturation of 3.8 and a transferrin of 299. Vitamin B-12 level returned at 370.   Past Medical History  Diagnosis Date  . Hypertension   . Obesity   . Sjogren's disease ([place])   . Hyperlipidemia   . Anemia   . Diverticulosis   . Intestinal obstruction ([place])   . Hiatal hernia   . Esophageal stricture   . Vitamin B12 deficiency   . Presence of permanent cardiac pacemaker   . Type II diabetes mellitus ([place])   . History of blood transfusion     "@ least w/1st knee OR"  . Osteoarthritis   . Arthritis   . Chronic back pain   . History of gout   . Anxiety   . Depression   . Kidney stones   . Chronic kidney disease (CKD), stage III (moderate)   . Atrial fibrillation ([place])   . Pericarditis   . Gastritis     Past Surgical History  Procedure  Laterality Date  . Joint replacement    . Colectomy      for rectovaginal fistula  . Colonoscopy  01/07/2012    Procedure: COLONOSCOPY;  Surgeon: Lafayette Dragon, MD;  Location: [facility];  Service: Endoscopy;  Laterality: N/A;  . Ep implantable device N/A 06/18/2015    Procedure: Pacemaker Implant;  Surgeon: Evans Lance, MD;  Location: [facility];  Service: Cardiovascular;  Laterality: N/A;  . Cholecystectomy open    . Insert / replace / remove pacemaker    . Total knee arthroplasty Bilateral   . Revision total knee arthroplasty Left   . Abdominal hysterectomy    . Knee arthroscopy Right   . Esophagogastroduodenoscopy (egd) with esophageal dilation    . Esophagogastroduodenoscopy (egd) with propofol N/A 08/12/2015    Procedure: ESOPHAGOGASTRODUODENOSCOPY (EGD) WITH PROPOFOL;  Surgeon: Manus Gunning, MD;  Location: [facility];  Service: Gastroenterology;  Laterality: N/A;    Medications: Reviewed  Allergies:  Allergies  Allergen Reactions  . Norvasc [Amlodipine Besylate] Shortness Of Breath  . Glucophage [Metformin Hydrochloride] Other (See Comments)    "increases creatinine"  . Lisinopril     Headache   . Losartan     Headache   . Morphine And Related Other (See Comments)    "hallucinations"  . Simvastatin Other (See Comments)  Body aches  . Trulicity [Dulaglutide] Other (See Comments)    Severe mood swings    Family history: Noncontributory  Social History:   She lives in [place]. 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 She quit smoking cigarettes 30-40 years ago. She reports 2-3 alcohol drinks per week. She has received red cell transfusions in the past. No risk factor for HIV or hepatitis.     ROS:   Positives include:30-40 pound weight loss over the past 5 years, constipation  A complete ROS was otherwise negative.  Physical Exam:  Blood pressure 153/68, pulse 77, temperature 97.5 F (36.4 C), temperature  source Oral, resp. rate 18, height 4' 10.5" (1.486 m), weight 246 lb 9.6 oz (111.857 kg), SpO2 97 %.  HEENT: Oropharynx without visible mass, neck without mass  Lungs: Coarse rhonchi at the posterior bases bilaterally, no respiratory distress  Cardiac: Regular rate and rhythm  Abdomen: No hepatosplenomegaly, nontender, no mass   Vascular: Trace low pretibial edema bilaterally  Lymph nodes: No cervical, supraclavicular, axillary, or inguinal nodes  Neurologic: Alert and oriented, the motor exam appears intact in the upper and lower extremities  Skin: No rash  Musculoskeletal: No spine tenderness  LAB:  CBC  Lab Results  Component Value Date   WBC 8.5 09/27/2015   HGB 7.7* 09/27/2015   HCT 25.2* 09/27/2015   MCV 86.9 09/27/2015   PLT 311 09/27/2015   NEUTROABS 7.2* 09/27/2015    Peripheral blood smear: The platelets appear normal in number. The white cell morphology is unremarkable. There are a few ovalocytes and teardrop forms. The polychromasia is not increased.   CMP      Component Value Date/Time   NA 137 08/28/2015 1617   K 5.0 08/28/2015 1617   CL 103 08/28/2015 1617   CO2 26 08/28/2015 1617   GLUCOSE 218* 08/28/2015 1617   BUN 52* 08/28/2015 1617   CREATININE 1.66* 08/28/2015 1617   CREATININE 1.22* 08/13/2015 1211   CALCIUM 9.4 08/28/2015 1617   PROT 6.2* 08/06/2015 0146   ALBUMIN 3.3* 08/06/2015 0146   AST 16 08/06/2015 0146   ALT 12* 08/06/2015 0146   ALKPHOS 57 08/06/2015 0146   BILITOT 0.6 08/06/2015 0146   GFRNONAA 43* 08/13/2015 1211   GFRAA 49* 08/13/2015 1211      Assessment/Plan:   1. Normocytic anemia-chronic  normal ferritin, serum iron studies-low percent transferrin saturation, normal transferrin  Hemoccult positive stool for break 2017, upper endoscopy 08/12/2015 with no source for bleeding  2.   Chronic renal insufficiency  3.    Atrial fibrillation-maintained on apixaban  4.    Osteoarthritis  5.     Diabetes  6.     Sjogren's  disease  7.     Pericarditis February 2017  Disposition:   Ms. Olaes is referred for evaluation of anemia. She has a chronic history of anemia with a lower hemoglobin level over the past several months. The anemia is most likely related to chronic renal insufficiency. The differential diagnosis includes myelodysplasia, the anemia of "chronic disease ", and anemia related to chronic blood loss.  We checked a serum protein electrophoresis/immunofixation and erythropoietin level today. She agrees to a trial of erythropoietin therapy. She will begin erythropoietin weekly in addition to twice daily ferrous sulfate. I reviewed the potential side effects associated with erythropoietin therapy and she agrees to proceed.  If the anemia does not correct with erythropoietin therapy we will consider a diagnostic bone marrow biopsy.  She  does not appear to have acute symptoms related to anemia today. We decided against a red cell transfusion. We will follow the hemoglobin weekly while on erythropoietin and arrange for transfusion support as needed.  Approximately 50 minutes were spent with the patient today. The majority of the time was used for counseling and coordination of care.  [name], MD  09/27/2015, 1:19 PM

## 2015-09-27 NOTE — Patient Instructions (Signed)
Epoetin Alfa injection What is this medicine? EPOETIN ALFA (e POE e tin AL fa) helps your body make more red blood cells. This medicine is used to treat anemia caused by chronic kidney failure, cancer chemotherapy, or HIV-therapy. It may also be used before surgery if you have anemia. This medicine may be used for other purposes; ask your health care provider or pharmacist if you have questions. What should I tell my health care provider before I take this medicine? They need to know if you have any of these conditions: -blood clotting disorders -cancer patient not on chemotherapy -cystic fibrosis -heart disease, such as angina or heart failure -hemoglobin level of 12 g/dL or greater -high blood pressure -low levels of folate, iron, or vitamin B12 -seizures -an unusual or allergic reaction to erythropoietin, albumin, benzyl alcohol, hamster proteins, other medicines, foods, dyes, or preservatives -pregnant or trying to get pregnant -breast-feeding How should I use this medicine? This medicine is for injection into a vein or under the skin. It is usually given by a health care professional in a hospital or clinic setting. If you get this medicine at home, you will be taught how to prepare and give this medicine. Use exactly as directed. Take your medicine at regular intervals. Do not take your medicine more often than directed. It is important that you put your used needles and syringes in a special sharps container. Do not put them in a trash can. If you do not have a sharps container, call your pharmacist or healthcare provider to get one. Talk to your pediatrician regarding the use of this medicine in children. While this drug may be prescribed for selected conditions, precautions do apply. Overdosage: If you think you have taken too much of this medicine contact a poison control center or emergency room at once. NOTE: This medicine is only for you. Do not share this medicine with  others. What if I miss a dose? If you miss a dose, take it as soon as you can. If it is almost time for your next dose, take only that dose. Do not take double or extra doses. What may interact with this medicine? Do not take this medicine with any of the following medications: -darbepoetin alfa This list may not describe all possible interactions. Give your health care provider a list of all the medicines, herbs, non-prescription drugs, or dietary supplements you use. Also tell them if you smoke, drink alcohol, or use illegal drugs. Some items may interact with your medicine. What should I watch for while using this medicine? Visit your prescriber or health care professional for regular checks on your progress and for the needed blood tests and blood pressure measurements. It is especially important for the doctor to make sure your hemoglobin level is in the desired range, to limit the risk of potential side effects and to give you the best benefit. Keep all appointments for any recommended tests. Check your blood pressure as directed. Ask your doctor what your blood pressure should be and when you should contact him or her. As your body makes more red blood cells, you may need to take iron, folic acid, or vitamin B supplements. Ask your doctor or health care provider which products are right for you. If you have kidney disease continue dietary restrictions, even though this medication can make you feel better. Talk with your doctor or health care professional about the foods you eat and the vitamins that you take. What side effects may I notice   from receiving this medicine? Side effects that you should report to your doctor or health care professional as soon as possible: -allergic reactions like skin rash, itching or hives, swelling of the face, lips, or tongue -breathing problems -changes in vision -chest pain -confusion, trouble speaking or understanding -feeling faint or lightheaded,  falls -high blood pressure -muscle aches or pains -pain, swelling, warmth in the leg -rapid weight gain -severe headaches -sudden numbness or weakness of the face, arm or leg -trouble walking, dizziness, loss of balance or coordination -seizures (convulsions) -swelling of the ankles, feet, hands -unusually weak or tired Side effects that usually do not require medical attention (report to your doctor or health care professional if they continue or are bothersome): -diarrhea -fever, chills (flu-like symptoms) -headaches -nausea, vomiting -redness, stinging, or swelling at site where injected This list may not describe all possible side effects. Call your doctor for medical advice about side effects. You may report side effects to FDA at 1-800-FDA-1088. Where should I keep my medicine? Keep out of the reach of children. Store in a refrigerator between 2 and 8 degrees C (36 and 46 degrees F). Do not freeze or shake. Throw away any unused portion if using a single-dose vial. Multi-dose vials can be kept in the refrigerator for up to 21 days after the initial dose. Throw away unused medicine. NOTE: This sheet is a summary. It may not cover all possible information. If you have questions about this medicine, talk to your doctor, pharmacist, or health care provider.    2016, Elsevier/Gold Standard. (2008-05-22 10:25:44)

## 2015-09-27 NOTE — Telephone Encounter (Signed)
per pof to sch pt appt-gave pt copy of avs

## 2015-09-28 LAB — ERYTHROPOIETIN: Erythropoietin: 48.3 m[IU]/mL — ABNORMAL HIGH (ref 2.6–18.5)

## 2015-09-30 NOTE — Telephone Encounter (Signed)
I have electronically submitted pt's info for Prolia insurance verification and will notify you once I have a response. Thank you. °

## 2015-10-03 ENCOUNTER — Other Ambulatory Visit: Payer: Self-pay | Admitting: *Deleted

## 2015-10-03 ENCOUNTER — Telehealth: Payer: Self-pay | Admitting: Oncology

## 2015-10-03 DIAGNOSIS — Z862 Personal history of diseases of the blood and blood-forming organs and certain disorders involving the immune mechanism: Secondary | ICD-10-CM

## 2015-10-03 DIAGNOSIS — M810 Age-related osteoporosis without current pathological fracture: Secondary | ICD-10-CM

## 2015-10-03 NOTE — Telephone Encounter (Signed)
cld pt and adv of time of lab appt 4/13_0 :45

## 2015-10-04 ENCOUNTER — Other Ambulatory Visit: Payer: Medicare Other

## 2015-10-04 ENCOUNTER — Ambulatory Visit (HOSPITAL_BASED_OUTPATIENT_CLINIC_OR_DEPARTMENT_OTHER): Payer: Medicare Other

## 2015-10-04 ENCOUNTER — Other Ambulatory Visit (HOSPITAL_BASED_OUTPATIENT_CLINIC_OR_DEPARTMENT_OTHER): Payer: Medicare Other

## 2015-10-04 VITALS — BP 152/39 | HR 70 | Temp 97.8°F

## 2015-10-04 DIAGNOSIS — N183 Chronic kidney disease, stage 3 unspecified: Secondary | ICD-10-CM

## 2015-10-04 DIAGNOSIS — D649 Anemia, unspecified: Secondary | ICD-10-CM

## 2015-10-04 DIAGNOSIS — M810 Age-related osteoporosis without current pathological fracture: Secondary | ICD-10-CM

## 2015-10-04 DIAGNOSIS — Z862 Personal history of diseases of the blood and blood-forming organs and certain disorders involving the immune mechanism: Secondary | ICD-10-CM

## 2015-10-04 LAB — CBC WITH DIFFERENTIAL/PLATELET
BASO%: 0.4 % (ref 0.0–2.0)
Basophils Absolute: 0 10*3/uL (ref 0.0–0.1)
EOS%: 1.9 % (ref 0.0–7.0)
Eosinophils Absolute: 0.1 10*3/uL (ref 0.0–0.5)
HCT: 25.2 % — ABNORMAL LOW (ref 34.8–46.6)
HGB: 7.9 g/dL — ABNORMAL LOW (ref 11.6–15.9)
LYMPH%: 13.5 % — ABNORMAL LOW (ref 14.0–49.7)
MCH: 26.3 pg (ref 25.1–34.0)
MCHC: 31.4 g/dL — ABNORMAL LOW (ref 31.5–36.0)
MCV: 83.7 fL (ref 79.5–101.0)
MONO#: 0.4 10*3/uL (ref 0.1–0.9)
MONO%: 5.9 % (ref 0.0–14.0)
NEUT#: 5.4 10*3/uL (ref 1.5–6.5)
NEUT%: 78.3 % — ABNORMAL HIGH (ref 38.4–76.8)
Platelets: 357 10*3/uL (ref 145–400)
RBC: 3.01 10*6/uL — ABNORMAL LOW (ref 3.70–5.45)
RDW: 16.2 % — ABNORMAL HIGH (ref 11.2–14.5)
WBC: 6.9 10*3/uL (ref 3.9–10.3)
lymph#: 0.9 10*3/uL (ref 0.9–3.3)

## 2015-10-04 MED ORDER — EPOETIN ALFA 40000 UNIT/ML IJ SOLN
40000.0000 [IU] | Freq: Once | INTRAMUSCULAR | Status: AC
  Administered 2015-10-04: 40000 [IU] via SUBCUTANEOUS
  Filled 2015-10-04: qty 1

## 2015-10-04 NOTE — Patient Instructions (Signed)
Epoetin Alfa injection What is this medicine? EPOETIN ALFA (e POE e tin AL fa) helps your body make more red blood cells. This medicine is used to treat anemia caused by chronic kidney failure, cancer chemotherapy, or HIV-therapy. It may also be used before surgery if you have anemia. This medicine may be used for other purposes; ask your health care provider or pharmacist if you have questions. What should I tell my health care provider before I take this medicine? They need to know if you have any of these conditions: -blood clotting disorders -cancer patient not on chemotherapy -cystic fibrosis -heart disease, such as angina or heart failure -hemoglobin level of 12 g/dL or greater -high blood pressure -low levels of folate, iron, or vitamin B12 -seizures -an unusual or allergic reaction to erythropoietin, albumin, benzyl alcohol, hamster proteins, other medicines, foods, dyes, or preservatives -pregnant or trying to get pregnant -breast-feeding How should I use this medicine? This medicine is for injection into a vein or under the skin. It is usually given by a health care professional in a hospital or clinic setting. If you get this medicine at home, you will be taught how to prepare and give this medicine. Use exactly as directed. Take your medicine at regular intervals. Do not take your medicine more often than directed. It is important that you put your used needles and syringes in a special sharps container. Do not put them in a trash can. If you do not have a sharps container, call your pharmacist or healthcare provider to get one. Talk to your pediatrician regarding the use of this medicine in children. While this drug may be prescribed for selected conditions, precautions do apply. Overdosage: If you think you have taken too much of this medicine contact a poison control center or emergency room at once. NOTE: This medicine is only for you. Do not share this medicine with  others. What if I miss a dose? If you miss a dose, take it as soon as you can. If it is almost time for your next dose, take only that dose. Do not take double or extra doses. What may interact with this medicine? Do not take this medicine with any of the following medications: -darbepoetin alfa This list may not describe all possible interactions. Give your health care provider a list of all the medicines, herbs, non-prescription drugs, or dietary supplements you use. Also tell them if you smoke, drink alcohol, or use illegal drugs. Some items may interact with your medicine. What should I watch for while using this medicine? Visit your prescriber or health care professional for regular checks on your progress and for the needed blood tests and blood pressure measurements. It is especially important for the doctor to make sure your hemoglobin level is in the desired range, to limit the risk of potential side effects and to give you the best benefit. Keep all appointments for any recommended tests. Check your blood pressure as directed. Ask your doctor what your blood pressure should be and when you should contact him or her. As your body makes more red blood cells, you may need to take iron, folic acid, or vitamin B supplements. Ask your doctor or health care provider which products are right for you. If you have kidney disease continue dietary restrictions, even though this medication can make you feel better. Talk with your doctor or health care professional about the foods you eat and the vitamins that you take. What side effects may I notice  from receiving this medicine? Side effects that you should report to your doctor or health care professional as soon as possible: -allergic reactions like skin rash, itching or hives, swelling of the face, lips, or tongue -breathing problems -changes in vision -chest pain -confusion, trouble speaking or understanding -feeling faint or lightheaded,  falls -high blood pressure -muscle aches or pains -pain, swelling, warmth in the leg -rapid weight gain -severe headaches -sudden numbness or weakness of the face, arm or leg -trouble walking, dizziness, loss of balance or coordination -seizures (convulsions) -swelling of the ankles, feet, hands -unusually weak or tired Side effects that usually do not require medical attention (report to your doctor or health care professional if they continue or are bothersome): -diarrhea -fever, chills (flu-like symptoms) -headaches -nausea, vomiting -redness, stinging, or swelling at site where injected This list may not describe all possible side effects. Call your doctor for medical advice about side effects. You may report side effects to FDA at 1-800-FDA-1088. Where should I keep my medicine? Keep out of the reach of children. Store in a refrigerator between 2 and 8 degrees C (36 and 46 degrees F). Do not freeze or shake. Throw away any unused portion if using a single-dose vial. Multi-dose vials can be kept in the refrigerator for up to 21 days after the initial dose. Throw away unused medicine. NOTE: This sheet is a summary. It may not cover all possible information. If you have questions about this medicine, talk to your doctor, pharmacist, or health care provider.    2016, Elsevier/Gold Standard. (2008-05-22 10:25:44)

## 2015-10-07 ENCOUNTER — Encounter: Payer: Self-pay | Admitting: *Deleted

## 2015-10-08 ENCOUNTER — Ambulatory Visit: Payer: Medicare Other | Admitting: Oncology

## 2015-10-08 NOTE — Telephone Encounter (Signed)
I have rec'd Ms. Spillers insurance verification for Prolia and she has an estimated responsibility of $0.  Please make pt aware this is an estimate and we will not know an exact amt until insurance(s) has/have paid.  I have sent a copy of the summary of benefits to be scanned into pt's chart.    Once pt recs injection, please let me know actual injection date so I can update the Prolia portal.  If you have any questions, please let me know.  Thank you!

## 2015-10-09 ENCOUNTER — Ambulatory Visit: Payer: Medicare Other | Admitting: Hematology

## 2015-10-11 ENCOUNTER — Ambulatory Visit (HOSPITAL_BASED_OUTPATIENT_CLINIC_OR_DEPARTMENT_OTHER): Payer: Medicare Other

## 2015-10-11 VITALS — BP 156/61 | HR 77 | Temp 98.2°F

## 2015-10-11 DIAGNOSIS — N183 Chronic kidney disease, stage 3 unspecified: Secondary | ICD-10-CM

## 2015-10-11 DIAGNOSIS — K222 Esophageal obstruction: Secondary | ICD-10-CM

## 2015-10-11 DIAGNOSIS — D649 Anemia, unspecified: Secondary | ICD-10-CM | POA: Diagnosis not present

## 2015-10-11 DIAGNOSIS — M810 Age-related osteoporosis without current pathological fracture: Secondary | ICD-10-CM

## 2015-10-11 LAB — CBC WITH DIFFERENTIAL/PLATELET
BASO%: 0.6 % (ref 0.0–2.0)
Basophils Absolute: 0 10*3/uL (ref 0.0–0.1)
EOS%: 0.9 % (ref 0.0–7.0)
Eosinophils Absolute: 0.1 10*3/uL (ref 0.0–0.5)
HCT: 26.6 % — ABNORMAL LOW (ref 34.8–46.6)
HGB: 8.2 g/dL — ABNORMAL LOW (ref 11.6–15.9)
LYMPH%: 10.5 % — ABNORMAL LOW (ref 14.0–49.7)
MCH: 25.4 pg (ref 25.1–34.0)
MCHC: 30.7 g/dL — ABNORMAL LOW (ref 31.5–36.0)
MCV: 82.7 fL (ref 79.5–101.0)
MONO#: 0.5 10*3/uL (ref 0.1–0.9)
MONO%: 6.5 % (ref 0.0–14.0)
NEUT#: 6.3 10*3/uL (ref 1.5–6.5)
NEUT%: 81.5 % — ABNORMAL HIGH (ref 38.4–76.8)
Platelets: 361 10*3/uL (ref 145–400)
RBC: 3.22 10*6/uL — ABNORMAL LOW (ref 3.70–5.45)
RDW: 17.1 % — ABNORMAL HIGH (ref 11.2–14.5)
WBC: 7.7 10*3/uL (ref 3.9–10.3)
lymph#: 0.8 10*3/uL — ABNORMAL LOW (ref 0.9–3.3)

## 2015-10-11 MED ORDER — EPOETIN ALFA 40000 UNIT/ML IJ SOLN
40000.0000 [IU] | Freq: Once | INTRAMUSCULAR | Status: AC
  Administered 2015-10-11: 40000 [IU] via SUBCUTANEOUS
  Filled 2015-10-11: qty 1

## 2015-10-11 NOTE — Patient Instructions (Signed)
Epoetin Alfa injection What is this medicine? EPOETIN ALFA (e POE e tin AL fa) helps your body make more red blood cells. This medicine is used to treat anemia caused by chronic kidney failure, cancer chemotherapy, or HIV-therapy. It may also be used before surgery if you have anemia. This medicine may be used for other purposes; ask your health care provider or pharmacist if you have questions. What should I tell my health care provider before I take this medicine? They need to know if you have any of these conditions: -blood clotting disorders -cancer patient not on chemotherapy -cystic fibrosis -heart disease, such as angina or heart failure -hemoglobin level of 12 g/dL or greater -high blood pressure -low levels of folate, iron, or vitamin B12 -seizures -an unusual or allergic reaction to erythropoietin, albumin, benzyl alcohol, hamster proteins, other medicines, foods, dyes, or preservatives -pregnant or trying to get pregnant -breast-feeding How should I use this medicine? This medicine is for injection into a vein or under the skin. It is usually given by a health care professional in a hospital or clinic setting. If you get this medicine at home, you will be taught how to prepare and give this medicine. Use exactly as directed. Take your medicine at regular intervals. Do not take your medicine more often than directed. It is important that you put your used needles and syringes in a special sharps container. Do not put them in a trash can. If you do not have a sharps container, call your pharmacist or healthcare provider to get one. Talk to your pediatrician regarding the use of this medicine in children. While this drug may be prescribed for selected conditions, precautions do apply. Overdosage: If you think you have taken too much of this medicine contact a poison control center or emergency room at once. NOTE: This medicine is only for you. Do not share this medicine with  others. What if I miss a dose? If you miss a dose, take it as soon as you can. If it is almost time for your next dose, take only that dose. Do not take double or extra doses. What may interact with this medicine? Do not take this medicine with any of the following medications: -darbepoetin alfa This list may not describe all possible interactions. Give your health care provider a list of all the medicines, herbs, non-prescription drugs, or dietary supplements you use. Also tell them if you smoke, drink alcohol, or use illegal drugs. Some items may interact with your medicine. What should I watch for while using this medicine? Visit your prescriber or health care professional for regular checks on your progress and for the needed blood tests and blood pressure measurements. It is especially important for the doctor to make sure your hemoglobin level is in the desired range, to limit the risk of potential side effects and to give you the best benefit. Keep all appointments for any recommended tests. Check your blood pressure as directed. Ask your doctor what your blood pressure should be and when you should contact him or her. As your body makes more red blood cells, you may need to take iron, folic acid, or vitamin B supplements. Ask your doctor or health care provider which products are right for you. If you have kidney disease continue dietary restrictions, even though this medication can make you feel better. Talk with your doctor or health care professional about the foods you eat and the vitamins that you take. What side effects may I notice   from receiving this medicine? Side effects that you should report to your doctor or health care professional as soon as possible: -allergic reactions like skin rash, itching or hives, swelling of the face, lips, or tongue -breathing problems -changes in vision -chest pain -confusion, trouble speaking or understanding -feeling faint or lightheaded,  falls -high blood pressure -muscle aches or pains -pain, swelling, warmth in the leg -rapid weight gain -severe headaches -sudden numbness or weakness of the face, arm or leg -trouble walking, dizziness, loss of balance or coordination -seizures (convulsions) -swelling of the ankles, feet, hands -unusually weak or tired Side effects that usually do not require medical attention (report to your doctor or health care professional if they continue or are bothersome): -diarrhea -fever, chills (flu-like symptoms) -headaches -nausea, vomiting -redness, stinging, or swelling at site where injected This list may not describe all possible side effects. Call your doctor for medical advice about side effects. You may report side effects to FDA at 1-800-FDA-1088. Where should I keep my medicine? Keep out of the reach of children. Store in a refrigerator between 2 and 8 degrees C (36 and 46 degrees F). Do not freeze or shake. Throw away any unused portion if using a single-dose vial. Multi-dose vials can be kept in the refrigerator for up to 21 days after the initial dose. Throw away unused medicine. NOTE: This sheet is a summary. It may not cover all possible information. If you have questions about this medicine, talk to your doctor, pharmacist, or health care provider.    2016, Elsevier/Gold Standard. (2008-05-22 10:25:44)

## 2015-10-14 LAB — IMMUNOFIXATION ELECTROPHORESIS
IgA, Qn, Serum: 357 mg/dL (ref 64–422)
IgG, Qn, Serum: 654 mg/dL — ABNORMAL LOW (ref 700–1600)
IgM, Qn, Serum: 46 mg/dL (ref 26–217)
Total Protein: 6.1 g/dL (ref 6.0–8.5)

## 2015-10-15 ENCOUNTER — Other Ambulatory Visit: Payer: Self-pay | Admitting: *Deleted

## 2015-10-15 DIAGNOSIS — M810 Age-related osteoporosis without current pathological fracture: Secondary | ICD-10-CM

## 2015-10-15 DIAGNOSIS — K222 Esophageal obstruction: Secondary | ICD-10-CM

## 2015-10-15 LAB — PROTEIN ELECTROPHORESIS, SERUM
A/G Ratio: 1.2 (ref 0.7–1.7)
Albumin: 3.3 g/dL (ref 2.9–4.4)
Alpha 1: 0.3 g/dL (ref 0.0–0.4)
Alpha 2: 0.9 g/dL (ref 0.4–1.0)
Beta: 1.1 g/dL (ref 0.7–1.3)
Gamma Globulin: 0.6 g/dL (ref 0.4–1.8)
Globulin, Total: 2.8 g/dL (ref 2.2–3.9)

## 2015-10-15 NOTE — Progress Notes (Signed)
Per lab, serum light chains able to be added to current labs.  Order placed.

## 2015-10-15 NOTE — Progress Notes (Signed)
Per lab, serum light chains are able to be added to current labs.  Order placed.

## 2015-10-16 LAB — KAPPA/LAMBDA LIGHT CHAINS
Ig Kappa Free Light Chain: 43.11 mg/L — ABNORMAL HIGH (ref 3.30–19.40)
Ig Lambda Free Light Chain: 38.72 mg/L — ABNORMAL HIGH (ref 5.71–26.30)
Kappa/Lambda FluidC Ratio: 1.11 (ref 0.26–1.65)

## 2015-10-18 ENCOUNTER — Ambulatory Visit: Payer: Medicare Other

## 2015-10-18 ENCOUNTER — Ambulatory Visit (HOSPITAL_BASED_OUTPATIENT_CLINIC_OR_DEPARTMENT_OTHER): Payer: Medicare Other | Admitting: Nurse Practitioner

## 2015-10-18 ENCOUNTER — Other Ambulatory Visit (HOSPITAL_BASED_OUTPATIENT_CLINIC_OR_DEPARTMENT_OTHER): Payer: Medicare Other

## 2015-10-18 ENCOUNTER — Telehealth: Payer: Self-pay | Admitting: Oncology

## 2015-10-18 VITALS — BP 145/60 | HR 67 | Temp 97.5°F | Resp 19 | Ht 58.5 in | Wt 241.5 lb

## 2015-10-18 DIAGNOSIS — M1990 Unspecified osteoarthritis, unspecified site: Secondary | ICD-10-CM

## 2015-10-18 DIAGNOSIS — E119 Type 2 diabetes mellitus without complications: Secondary | ICD-10-CM

## 2015-10-18 DIAGNOSIS — N189 Chronic kidney disease, unspecified: Secondary | ICD-10-CM | POA: Diagnosis not present

## 2015-10-18 DIAGNOSIS — I4891 Unspecified atrial fibrillation: Secondary | ICD-10-CM

## 2015-10-18 DIAGNOSIS — N183 Chronic kidney disease, stage 3 unspecified: Secondary | ICD-10-CM

## 2015-10-18 DIAGNOSIS — D649 Anemia, unspecified: Secondary | ICD-10-CM

## 2015-10-18 DIAGNOSIS — N184 Chronic kidney disease, stage 4 (severe): Secondary | ICD-10-CM

## 2015-10-18 DIAGNOSIS — M3500 Sicca syndrome, unspecified: Secondary | ICD-10-CM

## 2015-10-18 LAB — CBC WITH DIFFERENTIAL/PLATELET
BASO%: 0.4 % (ref 0.0–2.0)
Basophils Absolute: 0 10*3/uL (ref 0.0–0.1)
EOS%: 0.7 % (ref 0.0–7.0)
Eosinophils Absolute: 0 10*3/uL (ref 0.0–0.5)
HCT: 29.4 % — ABNORMAL LOW (ref 34.8–46.6)
HGB: 9.1 g/dL — ABNORMAL LOW (ref 11.6–15.9)
LYMPH%: 12.2 % — ABNORMAL LOW (ref 14.0–49.7)
MCH: 25.5 pg (ref 25.1–34.0)
MCHC: 30.8 g/dL — ABNORMAL LOW (ref 31.5–36.0)
MCV: 82.7 fL (ref 79.5–101.0)
MONO#: 0.5 10*3/uL (ref 0.1–0.9)
MONO%: 7.5 % (ref 0.0–14.0)
NEUT#: 5.6 10*3/uL (ref 1.5–6.5)
NEUT%: 79.2 % — ABNORMAL HIGH (ref 38.4–76.8)
Platelets: 344 10*3/uL (ref 145–400)
RBC: 3.56 10*6/uL — ABNORMAL LOW (ref 3.70–5.45)
RDW: 18.8 % — ABNORMAL HIGH (ref 11.2–14.5)
WBC: 7 10*3/uL (ref 3.9–10.3)
lymph#: 0.9 10*3/uL (ref 0.9–3.3)

## 2015-10-18 MED ORDER — EPOETIN ALFA 40000 UNIT/ML IJ SOLN
40000.0000 [IU] | Freq: Once | INTRAMUSCULAR | Status: AC
  Administered 2015-10-18: 40000 [IU] via SUBCUTANEOUS
  Filled 2015-10-18: qty 1

## 2015-10-18 NOTE — Patient Instructions (Signed)
Epoetin Alfa injection What is this medicine? EPOETIN ALFA (e POE e tin AL fa) helps your body make more red blood cells. This medicine is used to treat anemia caused by chronic kidney failure, cancer chemotherapy, or HIV-therapy. It may also be used before surgery if you have anemia. This medicine may be used for other purposes; ask your health care provider or pharmacist if you have questions. What should I tell my health care provider before I take this medicine? They need to know if you have any of these conditions: -blood clotting disorders -cancer patient not on chemotherapy -cystic fibrosis -heart disease, such as angina or heart failure -hemoglobin level of 12 g/dL or greater -high blood pressure -low levels of folate, iron, or vitamin B12 -seizures -an unusual or allergic reaction to erythropoietin, albumin, benzyl alcohol, hamster proteins, other medicines, foods, dyes, or preservatives -pregnant or trying to get pregnant -breast-feeding How should I use this medicine? This medicine is for injection into a vein or under the skin. It is usually given by a health care professional in a hospital or clinic setting. If you get this medicine at home, you will be taught how to prepare and give this medicine. Use exactly as directed. Take your medicine at regular intervals. Do not take your medicine more often than directed. It is important that you put your used needles and syringes in a special sharps container. Do not put them in a trash can. If you do not have a sharps container, call your pharmacist or healthcare provider to get one. Talk to your pediatrician regarding the use of this medicine in children. While this drug may be prescribed for selected conditions, precautions do apply. Overdosage: If you think you have taken too much of this medicine contact a poison control center or emergency room at once. NOTE: This medicine is only for you. Do not share this medicine with  others. What if I miss a dose? If you miss a dose, take it as soon as you can. If it is almost time for your next dose, take only that dose. Do not take double or extra doses. What may interact with this medicine? Do not take this medicine with any of the following medications: -darbepoetin alfa This list may not describe all possible interactions. Give your health care provider a list of all the medicines, herbs, non-prescription drugs, or dietary supplements you use. Also tell them if you smoke, drink alcohol, or use illegal drugs. Some items may interact with your medicine. What should I watch for while using this medicine? Visit your prescriber or health care professional for regular checks on your progress and for the needed blood tests and blood pressure measurements. It is especially important for the doctor to make sure your hemoglobin level is in the desired range, to limit the risk of potential side effects and to give you the best benefit. Keep all appointments for any recommended tests. Check your blood pressure as directed. Ask your doctor what your blood pressure should be and when you should contact him or her. As your body makes more red blood cells, you may need to take iron, folic acid, or vitamin B supplements. Ask your doctor or health care provider which products are right for you. If you have kidney disease continue dietary restrictions, even though this medication can make you feel better. Talk with your doctor or health care professional about the foods you eat and the vitamins that you take. What side effects may I notice   from receiving this medicine? Side effects that you should report to your doctor or health care professional as soon as possible: -allergic reactions like skin rash, itching or hives, swelling of the face, lips, or tongue -breathing problems -changes in vision -chest pain -confusion, trouble speaking or understanding -feeling faint or lightheaded,  falls -high blood pressure -muscle aches or pains -pain, swelling, warmth in the leg -rapid weight gain -severe headaches -sudden numbness or weakness of the face, arm or leg -trouble walking, dizziness, loss of balance or coordination -seizures (convulsions) -swelling of the ankles, feet, hands -unusually weak or tired Side effects that usually do not require medical attention (report to your doctor or health care professional if they continue or are bothersome): -diarrhea -fever, chills (flu-like symptoms) -headaches -nausea, vomiting -redness, stinging, or swelling at site where injected This list may not describe all possible side effects. Call your doctor for medical advice about side effects. You may report side effects to FDA at 1-800-FDA-1088. Where should I keep my medicine? Keep out of the reach of children. Store in a refrigerator between 2 and 8 degrees C (36 and 46 degrees F). Do not freeze or shake. Throw away any unused portion if using a single-dose vial. Multi-dose vials can be kept in the refrigerator for up to 21 days after the initial dose. Throw away unused medicine. NOTE: This sheet is a summary. It may not cover all possible information. If you have questions about this medicine, talk to your doctor, pharmacist, or health care provider.    2016, Elsevier/Gold Standard. (2008-05-22 10:25:44)

## 2015-10-18 NOTE — Telephone Encounter (Signed)
Gave and printed appt sched and avs fo rpt for May

## 2015-10-18 NOTE — Progress Notes (Signed)
Injection done during MD visit.

## 2015-10-18 NOTE — Progress Notes (Addendum)
  [facility] OFFICE PROGRESS NOTE   Diagnosis:  Anemia  INTERVAL HISTORY:   Ms. Lanni returns as scheduled. She began weekly erythropoietin 09/27/2015. No change in baseline dyspnea. Energy level remains poor. She has occasional lightheadedness. She has "muscle aches and pains". Her balance continues to be "off".  Objective:  Vital signs in last 24 hours:  Blood pressure 145/60, pulse 67, temperature 97.5 F (36.4 C), temperature source Oral, resp. rate 19, height 4' 10.5" (1.486 m), weight 241 lb 8 oz (109.544 kg), SpO2 98 %.   Resp: Coarse rhonchi at both lung bases. Cardio: Regular rate and rhythm. GI: Abdomen soft and nontender. Vascular: Trace lower leg edema bilaterally.   Lab Results:  Lab Results  Component Value Date   WBC 7.0 10/18/2015   HGB 9.1* 10/18/2015   HCT 29.4* 10/18/2015   MCV 82.7 10/18/2015   PLT 344 10/18/2015   NEUTROABS 5.6 10/18/2015    Imaging:  No results found.  Medications: I have reviewed the patient's current medications.  Assessment/Plan: 1. 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 Chronic renal insufficiency  3. Atrial fibrillation-maintained on apixaban  4. Osteoarthritis  5. Diabetes  6. Sjogren's disease  7. Pericarditis February 2017    Disposition: Ms. Metzer hemoglobin  has improved since beginning weekly erythropoietin. Plan to continue the same.   We discussed that a monoclonal protein was identified on serum IFE. The plan to repeat a serum protein electrophoresis, serum IFE/quantitative immunoglobulins and serum light chains in 3 months.  She will return for a follow-up visit in 4 weeks.   Patient seen with Dr. Benay Spice.    Ned Card ANP/GNP-BC   10/18/2015  11:35 AM  This was a shared visit with Ned Card. He anemia has partially corrected with erythropoietin. I have a low clinical suspicion for a lymphoproliferative disorder. The anemia is likely secondary to renal insufficiency and "chronic disease ". She will continue weekly erythropoietin.  Julieanne Manson, M.D.

## 2015-10-25 ENCOUNTER — Other Ambulatory Visit (HOSPITAL_BASED_OUTPATIENT_CLINIC_OR_DEPARTMENT_OTHER): Payer: Medicare Other

## 2015-10-25 ENCOUNTER — Ambulatory Visit (HOSPITAL_BASED_OUTPATIENT_CLINIC_OR_DEPARTMENT_OTHER): Payer: Medicare Other

## 2015-10-25 VITALS — BP 150/60 | HR 60 | Temp 97.5°F | Resp 22

## 2015-10-25 DIAGNOSIS — D649 Anemia, unspecified: Secondary | ICD-10-CM

## 2015-10-25 DIAGNOSIS — N189 Chronic kidney disease, unspecified: Secondary | ICD-10-CM

## 2015-10-25 DIAGNOSIS — N183 Chronic kidney disease, stage 3 unspecified: Secondary | ICD-10-CM

## 2015-10-25 DIAGNOSIS — D631 Anemia in chronic kidney disease: Secondary | ICD-10-CM | POA: Diagnosis not present

## 2015-10-25 DIAGNOSIS — N184 Chronic kidney disease, stage 4 (severe): Secondary | ICD-10-CM

## 2015-10-25 LAB — CBC WITH DIFFERENTIAL/PLATELET
BASO%: 0.2 % (ref 0.0–2.0)
Basophils Absolute: 0 10*3/uL (ref 0.0–0.1)
EOS%: 1.4 % (ref 0.0–7.0)
Eosinophils Absolute: 0.1 10*3/uL (ref 0.0–0.5)
HCT: 31.5 % — ABNORMAL LOW (ref 34.8–46.6)
HGB: 9.4 g/dL — ABNORMAL LOW (ref 11.6–15.9)
LYMPH%: 15.2 % (ref 14.0–49.7)
MCH: 26.1 pg (ref 25.1–34.0)
MCHC: 29.8 g/dL — ABNORMAL LOW (ref 31.5–36.0)
MCV: 87.5 fL (ref 79.5–101.0)
MONO#: 0.3 10*3/uL (ref 0.1–0.9)
MONO%: 5.1 % (ref 0.0–14.0)
NEUT#: 5 10*3/uL (ref 1.5–6.5)
NEUT%: 78.1 % — ABNORMAL HIGH (ref 38.4–76.8)
Platelets: 311 10*3/uL (ref 145–400)
RBC: 3.6 10*6/uL — ABNORMAL LOW (ref 3.70–5.45)
RDW: 17.5 % — ABNORMAL HIGH (ref 11.2–14.5)
WBC: 6.4 10*3/uL (ref 3.9–10.3)
lymph#: 1 10*3/uL (ref 0.9–3.3)

## 2015-10-25 MED ORDER — EPOETIN ALFA 20000 UNIT/ML IJ SOLN
40000.0000 [IU] | Freq: Once | INTRAMUSCULAR | Status: AC
  Administered 2015-10-25: 40000 [IU] via SUBCUTANEOUS
  Filled 2015-10-25: qty 2

## 2015-10-25 NOTE — Patient Instructions (Signed)
Epoetin Alfa injection What is this medicine? EPOETIN ALFA (e POE e tin AL fa) helps your body make more red blood cells. This medicine is used to treat anemia caused by chronic kidney failure, cancer chemotherapy, or HIV-therapy. It may also be used before surgery if you have anemia. This medicine may be used for other purposes; ask your health care provider or pharmacist if you have questions. What should I tell my health care provider before I take this medicine? They need to know if you have any of these conditions: -blood clotting disorders -cancer patient not on chemotherapy -cystic fibrosis -heart disease, such as angina or heart failure -hemoglobin level of 12 g/dL or greater -high blood pressure -low levels of folate, iron, or vitamin B12 -seizures -an unusual or allergic reaction to erythropoietin, albumin, benzyl alcohol, hamster proteins, other medicines, foods, dyes, or preservatives -pregnant or trying to get pregnant -breast-feeding How should I use this medicine? This medicine is for injection into a vein or under the skin. It is usually given by a health care professional in a hospital or clinic setting. If you get this medicine at home, you will be taught how to prepare and give this medicine. Use exactly as directed. Take your medicine at regular intervals. Do not take your medicine more often than directed. It is important that you put your used needles and syringes in a special sharps container. Do not put them in a trash can. If you do not have a sharps container, call your pharmacist or healthcare provider to get one. Talk to your pediatrician regarding the use of this medicine in children. While this drug may be prescribed for selected conditions, precautions do apply. Overdosage: If you think you have taken too much of this medicine contact a poison control center or emergency room at once. NOTE: This medicine is only for you. Do not share this medicine with  others. What if I miss a dose? If you miss a dose, take it as soon as you can. If it is almost time for your next dose, take only that dose. Do not take double or extra doses. What may interact with this medicine? Do not take this medicine with any of the following medications: -darbepoetin alfa This list may not describe all possible interactions. Give your health care provider a list of all the medicines, herbs, non-prescription drugs, or dietary supplements you use. Also tell them if you smoke, drink alcohol, or use illegal drugs. Some items may interact with your medicine. What should I watch for while using this medicine? Visit your prescriber or health care professional for regular checks on your progress and for the needed blood tests and blood pressure measurements. It is especially important for the doctor to make sure your hemoglobin level is in the desired range, to limit the risk of potential side effects and to give you the best benefit. Keep all appointments for any recommended tests. Check your blood pressure as directed. Ask your doctor what your blood pressure should be and when you should contact him or her. As your body makes more red blood cells, you may need to take iron, folic acid, or vitamin B supplements. Ask your doctor or health care provider which products are right for you. If you have kidney disease continue dietary restrictions, even though this medication can make you feel better. Talk with your doctor or health care professional about the foods you eat and the vitamins that you take. What side effects may I notice   from receiving this medicine? Side effects that you should report to your doctor or health care professional as soon as possible: -allergic reactions like skin rash, itching or hives, swelling of the face, lips, or tongue -breathing problems -changes in vision -chest pain -confusion, trouble speaking or understanding -feeling faint or lightheaded,  falls -high blood pressure -muscle aches or pains -pain, swelling, warmth in the leg -rapid weight gain -severe headaches -sudden numbness or weakness of the face, arm or leg -trouble walking, dizziness, loss of balance or coordination -seizures (convulsions) -swelling of the ankles, feet, hands -unusually weak or tired Side effects that usually do not require medical attention (report to your doctor or health care professional if they continue or are bothersome): -diarrhea -fever, chills (flu-like symptoms) -headaches -nausea, vomiting -redness, stinging, or swelling at site where injected This list may not describe all possible side effects. Call your doctor for medical advice about side effects. You may report side effects to FDA at 1-800-FDA-1088. Where should I keep my medicine? Keep out of the reach of children. Store in a refrigerator between 2 and 8 degrees C (36 and 46 degrees F). Do not freeze or shake. Throw away any unused portion if using a single-dose vial. Multi-dose vials can be kept in the refrigerator for up to 21 days after the initial dose. Throw away unused medicine. NOTE: This sheet is a summary. It may not cover all possible information. If you have questions about this medicine, talk to your doctor, pharmacist, or health care provider.    2016, Elsevier/Gold Standard. (2008-05-22 10:25:44)

## 2015-10-29 ENCOUNTER — Other Ambulatory Visit: Payer: Self-pay | Admitting: Oncology

## 2015-10-29 ENCOUNTER — Encounter: Payer: Self-pay | Admitting: Oncology

## 2015-10-29 DIAGNOSIS — D631 Anemia in chronic kidney disease: Secondary | ICD-10-CM | POA: Insufficient documentation

## 2015-10-29 DIAGNOSIS — N189 Chronic kidney disease, unspecified: Principal | ICD-10-CM

## 2015-10-29 HISTORY — DX: Anemia in chronic kidney disease: N18.9

## 2015-10-29 HISTORY — DX: Anemia in chronic kidney disease: D63.1

## 2015-10-31 ENCOUNTER — Telehealth: Payer: Self-pay | Admitting: Internal Medicine

## 2015-10-31 NOTE — Telephone Encounter (Signed)
PT thinks she is due for a prolia inj and wanted to check to see when she can schedule

## 2015-10-31 NOTE — Telephone Encounter (Signed)
Pt is scheduled for 11/05/15 at 1:30 pm for her Prolia inj.

## 2015-11-01 ENCOUNTER — Ambulatory Visit (HOSPITAL_BASED_OUTPATIENT_CLINIC_OR_DEPARTMENT_OTHER): Payer: Medicare Other

## 2015-11-01 ENCOUNTER — Other Ambulatory Visit (HOSPITAL_BASED_OUTPATIENT_CLINIC_OR_DEPARTMENT_OTHER): Payer: Medicare Other

## 2015-11-01 VITALS — BP 138/54 | HR 65 | Temp 97.7°F | Resp 22

## 2015-11-01 DIAGNOSIS — N183 Chronic kidney disease, stage 3 unspecified: Secondary | ICD-10-CM

## 2015-11-01 DIAGNOSIS — N184 Chronic kidney disease, stage 4 (severe): Secondary | ICD-10-CM

## 2015-11-01 DIAGNOSIS — D631 Anemia in chronic kidney disease: Secondary | ICD-10-CM | POA: Diagnosis not present

## 2015-11-01 DIAGNOSIS — D649 Anemia, unspecified: Secondary | ICD-10-CM

## 2015-11-01 LAB — CBC WITH DIFFERENTIAL/PLATELET
BASO%: 0.1 % (ref 0.0–2.0)
Basophils Absolute: 0 10*3/uL (ref 0.0–0.1)
EOS%: 0.7 % (ref 0.0–7.0)
Eosinophils Absolute: 0.1 10*3/uL (ref 0.0–0.5)
HCT: 34.4 % — ABNORMAL LOW (ref 34.8–46.6)
HGB: 10.3 g/dL — ABNORMAL LOW (ref 11.6–15.9)
LYMPH%: 12.9 % — ABNORMAL LOW (ref 14.0–49.7)
MCH: 26.3 pg (ref 25.1–34.0)
MCHC: 29.9 g/dL — ABNORMAL LOW (ref 31.5–36.0)
MCV: 87.8 fL (ref 79.5–101.0)
MONO#: 0.4 10*3/uL (ref 0.1–0.9)
MONO%: 6.1 % (ref 0.0–14.0)
NEUT#: 5.7 10*3/uL (ref 1.5–6.5)
NEUT%: 80.2 % — ABNORMAL HIGH (ref 38.4–76.8)
Platelets: 292 10*3/uL (ref 145–400)
RBC: 3.92 10*6/uL (ref 3.70–5.45)
RDW: 17.5 % — ABNORMAL HIGH (ref 11.2–14.5)
WBC: 7.1 10*3/uL (ref 3.9–10.3)
lymph#: 0.9 10*3/uL (ref 0.9–3.3)

## 2015-11-01 MED ORDER — EPOETIN ALFA 40000 UNIT/ML IJ SOLN
40000.0000 [IU] | Freq: Once | INTRAMUSCULAR | Status: AC
  Administered 2015-11-01: 40000 [IU] via SUBCUTANEOUS
  Filled 2015-11-01: qty 1

## 2015-11-01 NOTE — Patient Instructions (Signed)
Epoetin Alfa injection What is this medicine? EPOETIN ALFA (e POE e tin AL fa) helps your body make more red blood cells. This medicine is used to treat anemia caused by chronic kidney failure, cancer chemotherapy, or HIV-therapy. It may also be used before surgery if you have anemia. This medicine may be used for other purposes; ask your health care provider or pharmacist if you have questions. What should I tell my health care provider before I take this medicine? They need to know if you have any of these conditions: -blood clotting disorders -cancer patient not on chemotherapy -cystic fibrosis -heart disease, such as angina or heart failure -hemoglobin level of 12 g/dL or greater -high blood pressure -low levels of folate, iron, or vitamin B12 -seizures -an unusual or allergic reaction to erythropoietin, albumin, benzyl alcohol, hamster proteins, other medicines, foods, dyes, or preservatives -pregnant or trying to get pregnant -breast-feeding How should I use this medicine? This medicine is for injection into a vein or under the skin. It is usually given by a health care professional in a hospital or clinic setting. If you get this medicine at home, you will be taught how to prepare and give this medicine. Use exactly as directed. Take your medicine at regular intervals. Do not take your medicine more often than directed. It is important that you put your used needles and syringes in a special sharps container. Do not put them in a trash can. If you do not have a sharps container, call your pharmacist or healthcare provider to get one. Talk to your pediatrician regarding the use of this medicine in children. While this drug may be prescribed for selected conditions, precautions do apply. Overdosage: If you think you have taken too much of this medicine contact a poison control center or emergency room at once. NOTE: This medicine is only for you. Do not share this medicine with  others. What if I miss a dose? If you miss a dose, take it as soon as you can. If it is almost time for your next dose, take only that dose. Do not take double or extra doses. What may interact with this medicine? Do not take this medicine with any of the following medications: -darbepoetin alfa This list may not describe all possible interactions. Give your health care provider a list of all the medicines, herbs, non-prescription drugs, or dietary supplements you use. Also tell them if you smoke, drink alcohol, or use illegal drugs. Some items may interact with your medicine. What should I watch for while using this medicine? Visit your prescriber or health care professional for regular checks on your progress and for the needed blood tests and blood pressure measurements. It is especially important for the doctor to make sure your hemoglobin level is in the desired range, to limit the risk of potential side effects and to give you the best benefit. Keep all appointments for any recommended tests. Check your blood pressure as directed. Ask your doctor what your blood pressure should be and when you should contact him or her. As your body makes more red blood cells, you may need to take iron, folic acid, or vitamin B supplements. Ask your doctor or health care provider which products are right for you. If you have kidney disease continue dietary restrictions, even though this medication can make you feel better. Talk with your doctor or health care professional about the foods you eat and the vitamins that you take. What side effects may I notice   from receiving this medicine? Side effects that you should report to your doctor or health care professional as soon as possible: -allergic reactions like skin rash, itching or hives, swelling of the face, lips, or tongue -breathing problems -changes in vision -chest pain -confusion, trouble speaking or understanding -feeling faint or lightheaded,  falls -high blood pressure -muscle aches or pains -pain, swelling, warmth in the leg -rapid weight gain -severe headaches -sudden numbness or weakness of the face, arm or leg -trouble walking, dizziness, loss of balance or coordination -seizures (convulsions) -swelling of the ankles, feet, hands -unusually weak or tired Side effects that usually do not require medical attention (report to your doctor or health care professional if they continue or are bothersome): -diarrhea -fever, chills (flu-like symptoms) -headaches -nausea, vomiting -redness, stinging, or swelling at site where injected This list may not describe all possible side effects. Call your doctor for medical advice about side effects. You may report side effects to FDA at 1-800-FDA-1088. Where should I keep my medicine? Keep out of the reach of children. Store in a refrigerator between 2 and 8 degrees C (36 and 46 degrees F). Do not freeze or shake. Throw away any unused portion if using a single-dose vial. Multi-dose vials can be kept in the refrigerator for up to 21 days after the initial dose. Throw away unused medicine. NOTE: This sheet is a summary. It may not cover all possible information. If you have questions about this medicine, talk to your doctor, pharmacist, or health care provider.    2016, Elsevier/Gold Standard. (2008-05-22 10:25:44)

## 2015-11-05 ENCOUNTER — Other Ambulatory Visit (INDEPENDENT_AMBULATORY_CARE_PROVIDER_SITE_OTHER): Payer: Medicare Other | Admitting: *Deleted

## 2015-11-05 ENCOUNTER — Ambulatory Visit (INDEPENDENT_AMBULATORY_CARE_PROVIDER_SITE_OTHER): Payer: Medicare Other | Admitting: *Deleted

## 2015-11-05 DIAGNOSIS — M810 Age-related osteoporosis without current pathological fracture: Secondary | ICD-10-CM

## 2015-11-05 MED ORDER — DENOSUMAB 60 MG/ML ~~LOC~~ SOLN
60.0000 mg | Freq: Once | SUBCUTANEOUS | Status: AC
  Administered 2015-11-05: 60 mg via SUBCUTANEOUS

## 2015-11-08 ENCOUNTER — Ambulatory Visit (HOSPITAL_BASED_OUTPATIENT_CLINIC_OR_DEPARTMENT_OTHER): Payer: Medicare Other

## 2015-11-08 ENCOUNTER — Other Ambulatory Visit (HOSPITAL_BASED_OUTPATIENT_CLINIC_OR_DEPARTMENT_OTHER): Payer: Medicare Other

## 2015-11-08 VITALS — BP 144/63 | HR 63 | Temp 98.0°F | Resp 18

## 2015-11-08 DIAGNOSIS — D649 Anemia, unspecified: Secondary | ICD-10-CM

## 2015-11-08 DIAGNOSIS — N183 Chronic kidney disease, stage 3 unspecified: Secondary | ICD-10-CM

## 2015-11-08 DIAGNOSIS — N189 Chronic kidney disease, unspecified: Secondary | ICD-10-CM

## 2015-11-08 DIAGNOSIS — D631 Anemia in chronic kidney disease: Secondary | ICD-10-CM

## 2015-11-08 DIAGNOSIS — N184 Chronic kidney disease, stage 4 (severe): Secondary | ICD-10-CM

## 2015-11-08 LAB — CBC WITH DIFFERENTIAL/PLATELET
BASO%: 0.1 % (ref 0.0–2.0)
Basophils Absolute: 0 10*3/uL (ref 0.0–0.1)
EOS%: 0.7 % (ref 0.0–7.0)
Eosinophils Absolute: 0.1 10*3/uL (ref 0.0–0.5)
HCT: 36.2 % (ref 34.8–46.6)
HGB: 10.8 g/dL — ABNORMAL LOW (ref 11.6–15.9)
LYMPH%: 13.8 % — ABNORMAL LOW (ref 14.0–49.7)
MCH: 26.3 pg (ref 25.1–34.0)
MCHC: 29.8 g/dL — ABNORMAL LOW (ref 31.5–36.0)
MCV: 88.1 fL (ref 79.5–101.0)
MONO#: 0.6 10*3/uL (ref 0.1–0.9)
MONO%: 8.1 % (ref 0.0–14.0)
NEUT#: 5.3 10*3/uL (ref 1.5–6.5)
NEUT%: 77.3 % — ABNORMAL HIGH (ref 38.4–76.8)
Platelets: 300 10*3/uL (ref 145–400)
RBC: 4.11 10*6/uL (ref 3.70–5.45)
RDW: 17.7 % — ABNORMAL HIGH (ref 11.2–14.5)
WBC: 6.8 10*3/uL (ref 3.9–10.3)
lymph#: 0.9 10*3/uL (ref 0.9–3.3)

## 2015-11-08 MED ORDER — EPOETIN ALFA 40000 UNIT/ML IJ SOLN
40000.0000 [IU] | Freq: Once | INTRAMUSCULAR | Status: AC
  Administered 2015-11-08: 40000 [IU] via SUBCUTANEOUS
  Filled 2015-11-08: qty 1

## 2015-11-15 ENCOUNTER — Telehealth: Payer: Self-pay | Admitting: Oncology

## 2015-11-15 ENCOUNTER — Ambulatory Visit: Payer: Medicare Other

## 2015-11-15 ENCOUNTER — Other Ambulatory Visit (HOSPITAL_BASED_OUTPATIENT_CLINIC_OR_DEPARTMENT_OTHER): Payer: Medicare Other

## 2015-11-15 ENCOUNTER — Ambulatory Visit (HOSPITAL_BASED_OUTPATIENT_CLINIC_OR_DEPARTMENT_OTHER): Payer: Medicare Other | Admitting: Oncology

## 2015-11-15 VITALS — BP 133/48 | HR 67 | Temp 97.7°F | Resp 19 | Ht 58.5 in | Wt 244.9 lb

## 2015-11-15 DIAGNOSIS — N189 Chronic kidney disease, unspecified: Secondary | ICD-10-CM

## 2015-11-15 DIAGNOSIS — D649 Anemia, unspecified: Secondary | ICD-10-CM

## 2015-11-15 DIAGNOSIS — E119 Type 2 diabetes mellitus without complications: Secondary | ICD-10-CM

## 2015-11-15 DIAGNOSIS — I4891 Unspecified atrial fibrillation: Secondary | ICD-10-CM

## 2015-11-15 DIAGNOSIS — N184 Chronic kidney disease, stage 4 (severe): Secondary | ICD-10-CM

## 2015-11-15 DIAGNOSIS — D631 Anemia in chronic kidney disease: Secondary | ICD-10-CM

## 2015-11-15 DIAGNOSIS — Z862 Personal history of diseases of the blood and blood-forming organs and certain disorders involving the immune mechanism: Secondary | ICD-10-CM

## 2015-11-15 DIAGNOSIS — M3500 Sicca syndrome, unspecified: Secondary | ICD-10-CM

## 2015-11-15 DIAGNOSIS — M1990 Unspecified osteoarthritis, unspecified site: Secondary | ICD-10-CM

## 2015-11-15 LAB — CBC WITH DIFFERENTIAL/PLATELET
BASO%: 0 % (ref 0.0–2.0)
Basophils Absolute: 0 10*3/uL (ref 0.0–0.1)
EOS%: 0.5 % (ref 0.0–7.0)
Eosinophils Absolute: 0 10*3/uL (ref 0.0–0.5)
HCT: 37.8 % (ref 34.8–46.6)
HGB: 11.2 g/dL — ABNORMAL LOW (ref 11.6–15.9)
LYMPH%: 14.5 % (ref 14.0–49.7)
MCH: 26.6 pg (ref 25.1–34.0)
MCHC: 29.6 g/dL — ABNORMAL LOW (ref 31.5–36.0)
MCV: 89.8 fL (ref 79.5–101.0)
MONO#: 0.5 10*3/uL (ref 0.1–0.9)
MONO%: 7 % (ref 0.0–14.0)
NEUT#: 5.2 10*3/uL (ref 1.5–6.5)
NEUT%: 78 % — ABNORMAL HIGH (ref 38.4–76.8)
Platelets: 280 10*3/uL (ref 145–400)
RBC: 4.21 10*6/uL (ref 3.70–5.45)
RDW: 18 % — ABNORMAL HIGH (ref 11.2–14.5)
WBC: 6.6 10*3/uL (ref 3.9–10.3)
lymph#: 1 10*3/uL (ref 0.9–3.3)

## 2015-11-15 NOTE — Progress Notes (Signed)
Injection held today for Hgb of 11.2 per orders

## 2015-11-15 NOTE — Telephone Encounter (Signed)
Gave and printed appt sched and avs for pt for June and July

## 2015-11-15 NOTE — Progress Notes (Signed)
  [facility] OFFICE PROGRESS NOTE   Diagnosis: Anemia secondary to renal failure  INTERVAL HISTORY:   Tricia. Szuch returns as scheduled. She continues weekly erythropoietin. She is taking iron once daily. She reports a much improved energy level. No bleeding. She has loose stools when taking iron.  Objective:  Vital signs in last 24 hours:  Blood pressure 133/48, pulse 67, temperature 97.7 F (36.5 C), temperature source Oral, resp. rate 19, height 4' 10.5" (1.486 m), weight 244 lb 14.4 oz (111.086 kg), SpO2 93 %.   Resp: Lungs clear bilaterally Cardio: Regular rate and rhythm GI: No splenomegaly Vascular: Trace edema at the left greater than right lower leg   Lab Results:  Lab Results  Component Value Date   WBC 6.6 11/15/2015   HGB 11.2* 11/15/2015   HCT 37.8 11/15/2015   MCV 89.8 11/15/2015   PLT 280 11/15/2015   NEUTROABS 5.2 11/15/2015      Medications: I have reviewed the patient's current medications.  Assessment/Plan: 1. 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; quantitative immunoglobulins show IgG mildly decreased 654 (range [phone]), IgA and IgM in normal range; polyclonal serum light chain elevation  11/15/2015-hemoglobin 11.2  Treatment changed to every 2 week Aranesp beginning 11/29/2015  2. Chronic renal insufficiency  3. Atrial fibrillation-maintained on apixaban  4. Osteoarthritis  5. Diabetes  6. Sjogren's disease  7. Pericarditis February 2017    Disposition:  Tricia Gonzales  appears well. The hemoglobin has improved significantly with erythropoietin therapy. The hemoglobin is above goal range today. We will hold erythropoietin today with the plan to change to every 2 week Aranesp beginning on 11/29/2015. We again reviewed potential toxicities associated with erythropoietin therapy and she agrees to proceed. We will check serum iron studies when she returns in 2 weeks.  She will be scheduled for an office visit in 6 weeks.  [name], MD  11/15/2015  11:08 AM

## 2015-11-20 ENCOUNTER — Ambulatory Visit: Payer: Medicare Other | Admitting: Gastroenterology

## 2015-11-28 ENCOUNTER — Ambulatory Visit (INDEPENDENT_AMBULATORY_CARE_PROVIDER_SITE_OTHER): Payer: Medicare Other | Admitting: Gastroenterology

## 2015-11-28 ENCOUNTER — Encounter: Payer: Self-pay | Admitting: Gastroenterology

## 2015-11-28 VITALS — BP 140/74 | HR 76 | Ht 58.5 in | Wt 246.0 lb

## 2015-11-28 DIAGNOSIS — K2970 Gastritis, unspecified, without bleeding: Secondary | ICD-10-CM

## 2015-11-28 DIAGNOSIS — D649 Anemia, unspecified: Secondary | ICD-10-CM | POA: Diagnosis not present

## 2015-11-28 DIAGNOSIS — K2990 Gastroduodenitis, unspecified, without bleeding: Secondary | ICD-10-CM

## 2015-11-28 NOTE — Patient Instructions (Signed)
Please follow up as needed

## 2015-11-28 NOTE — Progress Notes (Signed)
HPI :  75 y/o female here for follow up. She was previously seen by our service in the hospital in the mid February, when she was admitted with pericarditis and noted to have drop in Hgb from baseline and (+) occult positive stools. She had previously had a colonoscopy in 2013 with diverticulosis only, otherwise normal. EGD done during her admission showed erosive gastris in the setting of steroids. She was on aspirin already but other NSAIDs held given her CKD.   Since her discharge she was diagnosed with atrial fibrillation, and she was placed on Eliquis. She has not had any blood in her stools. She is not having any abdominal pains. She has had a rectovaginal fistula repaired, about 6 years ago, thought to be secondary to diverticulitis. She has never been told she had Crohns disease. She takes protonix 44m daily and also takes aspirin 854mdaily at this time.   Since our last visit, I referred her to Hematology given she has had a chronic anemia dating back several years, and it was thought she had chronic anemia secondary to chronic kidney disease. She had a trial of erythropoiten and it improved her Hgb with conjunction of iron supplementation. Her most Hgb was in the 11s and has continued to rise on Epo. She reports feeling miuch better with more energy with higher Hgb. She is not seeing any blood in the stools although stools are dark from iron. Her bowel movements are a bit looser on iron. She continues to be on Eliquis. She denies any use of NSAIDs, she is taking tylenol PRN.   Past Medical History  Diagnosis Date  . Hypertension   . Obesity   . Sjogren's disease ([facility]  . Hyperlipidemia   . Anemia   . Diverticulosis   . Intestinal obstruction ([place]  . Hiatal hernia   . Esophageal stricture   . Vitamin B12 deficiency   . Presence of permanent cardiac pacemaker   . Type II diabetes mellitus ([facility]  . History of blood transfusion     "@ least w/1st knee OR"  . Osteoarthritis     . Arthritis   . Chronic back pain   . History of gout   . Anxiety   . Depression   . Kidney stones   . Chronic kidney disease (CKD), stage III (moderate)   . Atrial fibrillation ([facility]  . Pericarditis   . Gastritis   . 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/2015     Past Surgical History  Procedure Laterality Date  . Joint replacement    . Colectomy      for rectovaginal fistula  . Colonoscopy  01/07/2012    Procedure: COLONOSCOPY;  Surgeon: DoLafayette DragonMD;  Location: [facility];  Service: Endoscopy;  Laterality: N/A;  . Ep implantable device N/A 06/18/2015    Procedure: Pacemaker Implant;  Surgeon: GrEvans LanceMD;  [facility];  Service: Cardiovascular;  Laterality: N/A;  . Cholecystectomy open    . Insert / replace / remove pacemaker    . Total knee arthroplasty Bilateral   . Revision total knee arthroplasty Left   . Abdominal hysterectomy    . Knee arthroscopy Right   . Esophagogastroduodenoscopy (egd) with esophageal dilation    . Esophagogastroduodenoscopy (egd) with propofol N/A 08/12/2015    Procedure: ESOPHAGOGASTRODUODENOSCOPY (EGD) WITH PROPOFOL;  Surgeon: StManus GunningMD;  [facility] Service: Gastroenterology;  Laterality: N/A;   Family History  Problem Relation Age of Onset  . Diabetes Mother   . Diabetes Brother   . Colon cancer Neg Hx   . Hypertension Sister   . Heart attack Neg Hx   . Stroke Neg Hx    Social History  Substance Use Topics  . Smoking status: Former Smoker -- 2.00 packs/day for 3 years    Types: Cigarettes    Quit date: 06/23/1983  . Smokeless tobacco: Never Used  . Alcohol Use: 1.2 oz/week    2 Glasses of wine per week     Comment: occ   Current Outpatient Prescriptions  Medication Sig Dispense Refill  . ACCU-CHEK AVIVA PLUS test strip 1 each by Other route daily.     Marland Kitchen acetaminophen (TYLENOL) 650 MG CR tablet Take 1,300 mg by mouth at bedtime as needed for pain.    Marland Kitchen apixaban  (ELIQUIS) 2.5 MG TABS tablet Take 1 tablet (2.5 mg total) by mouth 2 (two) times daily. 180 tablet 3  . aspirin EC 81 MG tablet Take 81 mg by mouth daily.    . cholecalciferol (VITAMIN D) 1000 UNITS tablet Take 2,000 Units by mouth at bedtime. Reported on 11/15/2015    . denosumab (PROLIA) 60 MG/ML SOLN injection Inject 60 mg into the skin every 6 (six) months. Administer in upper arm, thigh, or abdomen    . doxazosin (CARDURA) 4 MG tablet Take 4 mg by mouth daily.    Marland Kitchen Epoetin Alfa (PROCRIT IJ) Inject as directed.    . ferrous sulfate 325 (65 FE) MG EC tablet Take 1 tablet (325 mg total) by mouth 2 (two) times daily. (Patient taking differently: Take 325 mg by mouth daily with breakfast. Once a day) 60 tablet 3  . gabapentin (NEURONTIN) 300 MG capsule Take 300 mg by mouth 2 (two) times daily.     Marland Kitchen glipiZIDE (GLUCOTROL XL) 10 MG 24 hr tablet Take 10 mg by mouth 2 (two) times daily.    Marland Kitchen HYDROcodone-acetaminophen (NORCO/VICODIN) 5-325 MG tablet Take 1 tablet by mouth every 6 (six) hours as needed for moderate pain.    Marland Kitchen irbesartan (AVAPRO) 300 MG tablet Take 300 mg by mouth daily.    Marland Kitchen JANUVIA 50 MG tablet     . metoprolol succinate (TOPROL-XL) 25 MG 24 hr tablet Take 1.5 tablets (37.5 mg total) by mouth daily. 90 tablet 3  . montelukast (SINGULAIR) 10 MG tablet Take 10 mg by mouth at bedtime.     . Multiple Vitamin (MULITIVITAMIN WITH MINERALS) TABS Take 1 tablet by mouth daily.    . pantoprazole (PROTONIX) 40 MG tablet Take 1 tablet (40 mg total) by mouth daily. 30 tablet 1  . pentoxifylline (TRENTAL) 400 MG CR tablet Take 400 mg by mouth daily.    . polyethylene glycol powder (GLYCOLAX/MIRALAX) powder miz 17 grams in 8 oz of water up to two times daily 255 g 3  . pravastatin (PRAVACHOL) 40 MG tablet Take 40 mg by mouth daily.     Marland Kitchen ULORIC 40 MG tablet Take 40 mg by mouth daily.      No current facility-administered medications for this visit.   Allergies  Allergen Reactions  . Norvasc  [Amlodipine Besylate] Shortness Of Breath  . Glucophage [Metformin Hydrochloride] Other (See Comments)    "increases creatinine"  . Lisinopril     Headache   . Losartan     Headache   . Morphine And Related Other (See Comments)    "hallucinations"  . Simvastatin Other (See Comments)  Body aches  . Trulicity [Dulaglutide] Other (See Comments)    Severe mood swings     Review of Systems: All systems reviewed and negative except where noted in HPI.   CBC Latest Ref Rng 11/15/2015 11/08/2015 11/01/2015  WBC 3.9 - 10.3 10e3/uL 6.6 6.8 7.1  Hemoglobin 11.6 - 15.9 g/dL 11.2(L) 10.8(L) 10.3(L)  Hematocrit 34.8 - 46.6 % 37.8 36.2 34.4(L)  Platelets 145 - 400 10e3/uL 280 300 292    Lab Results  Component Value Date   IRON 16* 09/12/2015   TIBC 186* 01/28/2009   FERRITIN 41.3 09/12/2015     Physical Exam: BP 140/74 mmHg  Pulse 76  Ht 4' 10.5" (1.486 m)  Wt 246 lb (111.585 kg)  BMI 50.53 kg/m2  SpO2 97% Constitutional: Pleasant,well-developed, female in no acute distress. HEENT: Normocephalic and atraumatic. Conjunctivae are normal. No scleral icterus. Cardiovascular: Normal rate, regular rhythm.  Pulmonary/chest: Effort normal and breath sounds normal. No wheezing, rales or rhonchi. Abdominal: Soft, nondistended, protuberant, nontender. Bowel sounds active throughout. There are no masses palpable. Extremities: no edema Lymphadenopathy: No cervical adenopathy noted. Neurological: Alert and oriented to person place and time. Skin: Skin is warm and dry. No rashes noted. Psychiatric: Normal mood and affect. Behavior is normal.   ASSESSMENT AND PLAN: 75 y/o female with multiple medical problems as outlined above, previously admitted with pericarditis and atrial fibrillation on Eliquis, here to follow up for anemia. She has had a longstanding normocytic anemia dating back to at least 2009. Baseline Hgb in the 8-9s range. She had a drop during hospitalization to 6s, without any  overt bleeding but did have a positive OB stool and was given a transfusion. EGD was performed showing erosive reactive gastritis which could have played a role while in the setting of steroid use. Her protonix was increased to twice daily at the time but since back to once daily. Her Hgb has been stable and rising while on Eliquis. No overt bleeding. Her last colonoscopy in 2013 at the time of her anemia was unremarkable.   Since our last visit I referred her to Hematology who thought her anemia was due to CKD. She has had a trial of Epo with significant improvement, Hgb has risen now to 11s, while being on Eliquis. I do not think she has any significant chronic GI blood loss contributing to her anemia at this time. She has repeat iron studies with CBC scheduled for tomorrow with Dr. Ammie Dalton. If she remains iron deficient we can consider a capsule endoscopy to clear the small bowel but given her stable anemia don't feel like this will be needed at present time, and she wishes to avoid it if possible. She can follow up with me as needed if further workup is needed from GI standpoint moving forward. Given her last colonoscopy was in 2013 and normal, I don't think she warrants further colon cancer screening if she remains asymptomatic. She agreed.   Campo Cellar, MD [facility] Pager [phone]

## 2015-11-29 ENCOUNTER — Ambulatory Visit: Payer: Medicare Other

## 2015-11-29 ENCOUNTER — Other Ambulatory Visit (HOSPITAL_BASED_OUTPATIENT_CLINIC_OR_DEPARTMENT_OTHER): Payer: Medicare Other

## 2015-11-29 DIAGNOSIS — Z862 Personal history of diseases of the blood and blood-forming organs and certain disorders involving the immune mechanism: Secondary | ICD-10-CM

## 2015-11-29 LAB — CBC WITH DIFFERENTIAL/PLATELET
BASO%: 0.2 % (ref 0.0–2.0)
Basophils Absolute: 0 10*3/uL (ref 0.0–0.1)
EOS%: 0.4 % (ref 0.0–7.0)
Eosinophils Absolute: 0 10*3/uL (ref 0.0–0.5)
HCT: 35.6 % (ref 34.8–46.6)
HGB: 11.1 g/dL — ABNORMAL LOW (ref 11.6–15.9)
LYMPH%: 12.9 % — ABNORMAL LOW (ref 14.0–49.7)
MCH: 26.6 pg (ref 25.1–34.0)
MCHC: 31.1 g/dL — ABNORMAL LOW (ref 31.5–36.0)
MCV: 85.7 fL (ref 79.5–101.0)
MONO#: 0.5 10*3/uL (ref 0.1–0.9)
MONO%: 6.6 % (ref 0.0–14.0)
NEUT#: 5.9 10*3/uL (ref 1.5–6.5)
NEUT%: 79.9 % — ABNORMAL HIGH (ref 38.4–76.8)
Platelets: 219 10*3/uL (ref 145–400)
RBC: 4.16 10*6/uL (ref 3.70–5.45)
RDW: 17.5 % — ABNORMAL HIGH (ref 11.2–14.5)
WBC: 7.4 10*3/uL (ref 3.9–10.3)
lymph#: 1 10*3/uL (ref 0.9–3.3)

## 2015-11-29 LAB — IRON AND TIBC
%SAT: 24 % (ref 21–57)
Iron: 72 ug/dL (ref 41–142)
TIBC: 303 ug/dL (ref 236–444)
UIBC: 229 ug/dL (ref 120–384)

## 2015-11-29 LAB — FERRITIN: Ferritin: 42 ng/ml (ref 9–269)

## 2015-11-29 NOTE — Progress Notes (Signed)
Hgb noted to be 11.1 today. No injection of Aranesp needed. Pt given copy of labs and current schedule. Pt instructed to call center should issues occur. Pt verbalized understanding of instructions

## 2015-12-13 ENCOUNTER — Other Ambulatory Visit (HOSPITAL_BASED_OUTPATIENT_CLINIC_OR_DEPARTMENT_OTHER): Payer: Medicare Other

## 2015-12-13 ENCOUNTER — Ambulatory Visit (HOSPITAL_BASED_OUTPATIENT_CLINIC_OR_DEPARTMENT_OTHER): Payer: Medicare Other

## 2015-12-13 VITALS — BP 142/63 | HR 66 | Temp 97.5°F | Resp 20

## 2015-12-13 DIAGNOSIS — Z862 Personal history of diseases of the blood and blood-forming organs and certain disorders involving the immune mechanism: Secondary | ICD-10-CM

## 2015-12-13 DIAGNOSIS — N183 Chronic kidney disease, stage 3 unspecified: Secondary | ICD-10-CM

## 2015-12-13 DIAGNOSIS — D631 Anemia in chronic kidney disease: Secondary | ICD-10-CM | POA: Diagnosis not present

## 2015-12-13 DIAGNOSIS — N189 Chronic kidney disease, unspecified: Secondary | ICD-10-CM

## 2015-12-13 LAB — CBC WITH DIFFERENTIAL/PLATELET
BASO%: 0.1 % (ref 0.0–2.0)
Basophils Absolute: 0 10*3/uL (ref 0.0–0.1)
EOS%: 0.8 % (ref 0.0–7.0)
Eosinophils Absolute: 0.1 10*3/uL (ref 0.0–0.5)
HCT: 33.2 % — ABNORMAL LOW (ref 34.8–46.6)
HGB: 10.4 g/dL — ABNORMAL LOW (ref 11.6–15.9)
LYMPH%: 13.5 % — ABNORMAL LOW (ref 14.0–49.7)
MCH: 27.2 pg (ref 25.1–34.0)
MCHC: 31.3 g/dL — ABNORMAL LOW (ref 31.5–36.0)
MCV: 86.7 fL (ref 79.5–101.0)
MONO#: 0.6 10*3/uL (ref 0.1–0.9)
MONO%: 7.7 % (ref 0.0–14.0)
NEUT#: 5.8 10*3/uL (ref 1.5–6.5)
NEUT%: 77.9 % — ABNORMAL HIGH (ref 38.4–76.8)
Platelets: 210 10*3/uL (ref 145–400)
RBC: 3.83 10*6/uL (ref 3.70–5.45)
RDW: 15.8 % — ABNORMAL HIGH (ref 11.2–14.5)
WBC: 7.4 10*3/uL (ref 3.9–10.3)
lymph#: 1 10*3/uL (ref 0.9–3.3)

## 2015-12-13 MED ORDER — DARBEPOETIN ALFA 100 MCG/0.5ML IJ SOSY
100.0000 ug | PREFILLED_SYRINGE | Freq: Once | INTRAMUSCULAR | Status: AC
  Administered 2015-12-13: 100 ug via SUBCUTANEOUS
  Filled 2015-12-13: qty 0.5

## 2015-12-13 NOTE — Patient Instructions (Signed)
Darbepoetin Alfa injection What is this medicine? DARBEPOETIN ALFA (dar be POE e tin AL fa) helps your body make more red blood cells. It is used to treat anemia caused by chronic kidney failure and chemotherapy. This medicine may be used for other purposes; ask your health care provider or pharmacist if you have questions. What should I tell my health care provider before I take this medicine? They need to know if you have any of these conditions: -blood clotting disorders or history of blood clots -cancer patient not on chemotherapy -cystic fibrosis -heart disease, such as angina, heart failure, or a history of a heart attack -hemoglobin level of 12 g/dL or greater -high blood pressure -low levels of folate, iron, or vitamin B12 -seizures -an unusual or allergic reaction to darbepoetin, erythropoietin, albumin, hamster proteins, latex, other medicines, foods, dyes, or preservatives -pregnant or trying to get pregnant -breast-feeding How should I use this medicine? This medicine is for injection into a vein or under the skin. It is usually given by a health care professional in a hospital or clinic setting. If you get this medicine at home, you will be taught how to prepare and give this medicine. Do not shake the solution before you withdraw a dose. Use exactly as directed. Take your medicine at regular intervals. Do not take your medicine more often than directed. It is important that you put your used needles and syringes in a special sharps container. Do not put them in a trash can. If you do not have a sharps container, call your pharmacist or healthcare provider to get one. Talk to your pediatrician regarding the use of this medicine in children. While this medicine may be used in children as young as 1 year for selected conditions, precautions do apply. Overdosage: If you think you have taken too much of this medicine contact a poison control center or emergency room at once. NOTE:  This medicine is only for you. Do not share this medicine with others. What if I miss a dose? If you miss a dose, take it as soon as you can. If it is almost time for your next dose, take only that dose. Do not take double or extra doses. What may interact with this medicine? Do not take this medicine with any of the following medications: -epoetin alfa This list may not describe all possible interactions. Give your health care provider a list of all the medicines, herbs, non-prescription drugs, or dietary supplements you use. Also tell them if you smoke, drink alcohol, or use illegal drugs. Some items may interact with your medicine. What should I watch for while using this medicine? Visit your prescriber or health care professional for regular checks on your progress and for the needed blood tests and blood pressure measurements. It is especially important for the doctor to make sure your hemoglobin level is in the desired range, to limit the risk of potential side effects and to give you the best benefit. Keep all appointments for any recommended tests. Check your blood pressure as directed. Ask your doctor what your blood pressure should be and when you should contact him or her. As your body makes more red blood cells, you may need to take iron, folic acid, or vitamin B supplements. Ask your doctor or health care provider which products are right for you. If you have kidney disease continue dietary restrictions, even though this medication can make you feel better. Talk with your doctor or health care professional about the   foods you eat and the vitamins that you take. What side effects may I notice from receiving this medicine? Side effects that you should report to your doctor or health care professional as soon as possible: -allergic reactions like skin rash, itching or hives, swelling of the face, lips, or tongue -breathing problems -changes in vision -chest pain -confusion, trouble speaking  or understanding -feeling faint or lightheaded, falls -high blood pressure -muscle aches or pains -pain, swelling, warmth in the leg -rapid weight gain -severe headaches -sudden numbness or weakness of the face, arm or leg -trouble walking, dizziness, loss of balance or coordination -seizures (convulsions) -swelling of the ankles, feet, hands -unusually weak or tired Side effects that usually do not require medical attention (report to your doctor or health care professional if they continue or are bothersome): -diarrhea -fever, chills (flu-like symptoms) -headaches -nausea, vomiting -redness, stinging, or swelling at site where injected This list may not describe all possible side effects. Call your doctor for medical advice about side effects. You may report side effects to FDA at 1-800-FDA-1088. Where should I keep my medicine? Keep out of the reach of children. Store in a refrigerator between 2 and 8 degrees C (36 and 46 degrees F). Do not freeze. Do not shake. Throw away any unused portion if using a single-dose vial. Throw away any unused medicine after the expiration date. NOTE: This sheet is a summary. It may not cover all possible information. If you have questions about this medicine, talk to your doctor, pharmacist, or health care provider.    2016, Elsevier/[employer]. (2008-05-22 10:23:57)

## 2015-12-18 ENCOUNTER — Telehealth: Payer: Self-pay | Admitting: Cardiology

## 2015-12-18 ENCOUNTER — Ambulatory Visit (INDEPENDENT_AMBULATORY_CARE_PROVIDER_SITE_OTHER): Payer: Medicare Other | Admitting: *Deleted

## 2015-12-18 DIAGNOSIS — I442 Atrioventricular block, complete: Secondary | ICD-10-CM | POA: Diagnosis not present

## 2015-12-18 NOTE — Progress Notes (Signed)
Remote pacemaker transmission.   

## 2015-12-18 NOTE — Telephone Encounter (Signed)
LMOVM reminding pt to send remote transmission.

## 2015-12-19 LAB — CUP PACEART REMOTE DEVICE CHECK
Battery Remaining Longevity: 111 mo
Battery Voltage: 3.03 V
Brady Statistic AP VP Percent: 28.58 %
Brady Statistic AP VS Percent: 3.38 %
Brady Statistic AS VP Percent: 2.61 %
Brady Statistic AS VS Percent: 65.43 %
Brady Statistic RA Percent Paced: 31.96 %
Brady Statistic RV Percent Paced: 31.19 %
Date Time Interrogation Session: 20170628173119
Implantable Lead Implant Date: 20161227
Implantable Lead Implant Date: 20161227
Implantable Lead Location: 753859
Implantable Lead Location: 753860
Implantable Lead Model: 5076
Implantable Lead Model: 5076
Lead Channel Impedance Value: 361 Ohm
Lead Channel Impedance Value: 418 Ohm
Lead Channel Impedance Value: 437 Ohm
Lead Channel Impedance Value: 513 Ohm
Lead Channel Pacing Threshold Amplitude: 0.625 V
Lead Channel Pacing Threshold Amplitude: 0.75 V
Lead Channel Pacing Threshold Pulse Width: 0.4 ms
Lead Channel Pacing Threshold Pulse Width: 0.4 ms
Lead Channel Sensing Intrinsic Amplitude: 11.25 mV
Lead Channel Sensing Intrinsic Amplitude: 11.25 mV
Lead Channel Sensing Intrinsic Amplitude: 3 mV
Lead Channel Sensing Intrinsic Amplitude: 3 mV
Lead Channel Setting Pacing Amplitude: 2 V
Lead Channel Setting Pacing Amplitude: 2.5 V
Lead Channel Setting Pacing Pulse Width: 0.4 ms
Lead Channel Setting Sensing Sensitivity: 2.8 mV

## 2015-12-20 ENCOUNTER — Encounter: Payer: Self-pay | Admitting: Cardiology

## 2015-12-27 ENCOUNTER — Other Ambulatory Visit (HOSPITAL_BASED_OUTPATIENT_CLINIC_OR_DEPARTMENT_OTHER): Payer: Medicare Other

## 2015-12-27 ENCOUNTER — Telehealth: Payer: Self-pay | Admitting: Nurse Practitioner

## 2015-12-27 ENCOUNTER — Ambulatory Visit (HOSPITAL_BASED_OUTPATIENT_CLINIC_OR_DEPARTMENT_OTHER): Payer: Medicare Other

## 2015-12-27 ENCOUNTER — Ambulatory Visit (HOSPITAL_BASED_OUTPATIENT_CLINIC_OR_DEPARTMENT_OTHER): Payer: Medicare Other | Admitting: Nurse Practitioner

## 2015-12-27 VITALS — BP 125/74 | HR 72 | Temp 97.7°F | Resp 17 | Ht 58.5 in | Wt 246.1 lb

## 2015-12-27 DIAGNOSIS — M3500 Sicca syndrome, unspecified: Secondary | ICD-10-CM

## 2015-12-27 DIAGNOSIS — Z862 Personal history of diseases of the blood and blood-forming organs and certain disorders involving the immune mechanism: Secondary | ICD-10-CM

## 2015-12-27 DIAGNOSIS — D631 Anemia in chronic kidney disease: Secondary | ICD-10-CM

## 2015-12-27 DIAGNOSIS — I4891 Unspecified atrial fibrillation: Secondary | ICD-10-CM | POA: Diagnosis not present

## 2015-12-27 DIAGNOSIS — N189 Chronic kidney disease, unspecified: Secondary | ICD-10-CM | POA: Diagnosis not present

## 2015-12-27 DIAGNOSIS — E119 Type 2 diabetes mellitus without complications: Secondary | ICD-10-CM | POA: Diagnosis not present

## 2015-12-27 DIAGNOSIS — M1990 Unspecified osteoarthritis, unspecified site: Secondary | ICD-10-CM

## 2015-12-27 LAB — CBC WITH DIFFERENTIAL/PLATELET
BASO%: 0.1 % (ref 0.0–2.0)
Basophils Absolute: 0 10*3/uL (ref 0.0–0.1)
EOS%: 0.8 % (ref 0.0–7.0)
Eosinophils Absolute: 0.1 10*3/uL (ref 0.0–0.5)
HCT: 34.1 % — ABNORMAL LOW (ref 34.8–46.6)
HGB: 10.7 g/dL — ABNORMAL LOW (ref 11.6–15.9)
LYMPH%: 11.8 % — ABNORMAL LOW (ref 14.0–49.7)
MCH: 27.7 pg (ref 25.1–34.0)
MCHC: 31.4 g/dL — ABNORMAL LOW (ref 31.5–36.0)
MCV: 88.3 fL (ref 79.5–101.0)
MONO#: 0.4 10*3/uL (ref 0.1–0.9)
MONO%: 5.2 % (ref 0.0–14.0)
NEUT#: 6.3 10*3/uL (ref 1.5–6.5)
NEUT%: 82.1 % — ABNORMAL HIGH (ref 38.4–76.8)
Platelets: 225 10*3/uL (ref 145–400)
RBC: 3.86 10*6/uL (ref 3.70–5.45)
RDW: 16.5 % — ABNORMAL HIGH (ref 11.2–14.5)
WBC: 7.6 10*3/uL (ref 3.9–10.3)
lymph#: 0.9 10*3/uL (ref 0.9–3.3)

## 2015-12-27 MED ORDER — DARBEPOETIN ALFA 100 MCG/0.5ML IJ SOSY
100.0000 ug | PREFILLED_SYRINGE | Freq: Once | INTRAMUSCULAR | Status: AC
  Administered 2015-12-27: 100 ug via SUBCUTANEOUS
  Filled 2015-12-27: qty 0.5

## 2015-12-27 NOTE — Patient Instructions (Signed)
Darbepoetin Alfa injection What is this medicine? DARBEPOETIN ALFA (dar be POE e tin AL fa) helps your body make more red blood cells. It is used to treat anemia caused by chronic kidney failure and chemotherapy. This medicine may be used for other purposes; ask your health care provider or pharmacist if you have questions. What should I tell my health care provider before I take this medicine? They need to know if you have any of these conditions: -blood clotting disorders or history of blood clots -cancer patient not on chemotherapy -cystic fibrosis -heart disease, such as angina, heart failure, or a history of a heart attack -hemoglobin level of 12 g/dL or greater -high blood pressure -low levels of folate, iron, or vitamin B12 -seizures -an unusual or allergic reaction to darbepoetin, erythropoietin, albumin, hamster proteins, latex, other medicines, foods, dyes, or preservatives -pregnant or trying to get pregnant -breast-feeding How should I use this medicine? This medicine is for injection into a vein or under the skin. It is usually given by a health care professional in a hospital or clinic setting. If you get this medicine at home, you will be taught how to prepare and give this medicine. Do not shake the solution before you withdraw a dose. Use exactly as directed. Take your medicine at regular intervals. Do not take your medicine more often than directed. It is important that you put your used needles and syringes in a special sharps container. Do not put them in a trash can. If you do not have a sharps container, call your pharmacist or healthcare provider to get one. Talk to your pediatrician regarding the use of this medicine in children. While this medicine may be used in children as young as 1 year for selected conditions, precautions do apply. Overdosage: If you think you have taken too much of this medicine contact a poison control center or emergency room at once. NOTE:  This medicine is only for you. Do not share this medicine with others. What if I miss a dose? If you miss a dose, take it as soon as you can. If it is almost time for your next dose, take only that dose. Do not take double or extra doses. What may interact with this medicine? Do not take this medicine with any of the following medications: -epoetin alfa This list may not describe all possible interactions. Give your health care provider a list of all the medicines, herbs, non-prescription drugs, or dietary supplements you use. Also tell them if you smoke, drink alcohol, or use illegal drugs. Some items may interact with your medicine. What should I watch for while using this medicine? Visit your prescriber or health care professional for regular checks on your progress and for the needed blood tests and blood pressure measurements. It is especially important for the doctor to make sure your hemoglobin level is in the desired range, to limit the risk of potential side effects and to give you the best benefit. Keep all appointments for any recommended tests. Check your blood pressure as directed. Ask your doctor what your blood pressure should be and when you should contact him or her. As your body makes more red blood cells, you may need to take iron, folic acid, or vitamin B supplements. Ask your doctor or health care provider which products are right for you. If you have kidney disease continue dietary restrictions, even though this medication can make you feel better. Talk with your doctor or health care professional about the  foods you eat and the vitamins that you take. What side effects may I notice from receiving this medicine? Side effects that you should report to your doctor or health care professional as soon as possible: -allergic reactions like skin rash, itching or hives, swelling of the face, lips, or tongue -breathing problems -changes in vision -chest pain -confusion, trouble speaking  or understanding -feeling faint or lightheaded, falls -high blood pressure -muscle aches or pains -pain, swelling, warmth in the leg -rapid weight gain -severe headaches -sudden numbness or weakness of the face, arm or leg -trouble walking, dizziness, loss of balance or coordination -seizures (convulsions) -swelling of the ankles, feet, hands -unusually weak or tired Side effects that usually do not require medical attention (report to your doctor or health care professional if they continue or are bothersome): -diarrhea -fever, chills (flu-like symptoms) -headaches -nausea, vomiting -redness, stinging, or swelling at site where injected This list may not describe all possible side effects. Call your doctor for medical advice about side effects. You may report side effects to FDA at 1-800-FDA-1088. Where should I keep my medicine? Keep out of the reach of children. Store in a refrigerator between 2 and 8 degrees C (36 and 46 degrees F). Do not freeze. Do not shake. Throw away any unused portion if using a single-dose vial. Throw away any unused medicine after the expiration date. NOTE: This sheet is a summary. It may not cover all possible information. If you have questions about this medicine, talk to your doctor, pharmacist, or health care provider.    2016, [employer]vier/[employer] Standard. (2008-05-22 10:23:57)

## 2015-12-27 NOTE — Progress Notes (Signed)
  [place] OFFICE PROGRESS NOTE   Diagnosis:  Anemia secondary to renal failure  INTERVAL HISTORY:   Tricia Gonzales returns as scheduled. She continues erythropoietin, currently on a 2 week schedule. The last few weeks she has noted a decreased energy level. She has had some trouble sleeping. She wonders if this is due to the heat. No shortness of breath. She continues oral iron. She has loose stools when taking iron. She denies any bleeding.  Objective:  Vital signs in last 24 hours:  Blood pressure 125/74, pulse 72, temperature 97.7 F (36.5 C), temperature source Oral, resp. rate 17, height 4' 10.5" (1.486 m), weight 246 lb 1.6 oz (111.63 kg), SpO2 96 %.   Resp: Faint rales at both lung bases. Cardio: Regular rate and rhythm. GI: Abdomen is soft. No splenomegaly. Vascular: Trace edema at the lower legs bilaterally left greater than right.   Lab Results:  Lab Results  Component Value Date   WBC 7.6 12/27/2015   HGB 10.7* 12/27/2015   HCT 34.1* 12/27/2015   MCV 88.3 12/27/2015   PLT 225 12/27/2015   NEUTROABS 6.3 12/27/2015    Imaging:  No results found.  Medications: I have reviewed the patient's current medications.  Assessment/Plan: 1. 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 Chronic renal insufficiency  3. Atrial fibrillation-maintained on apixaban  4. Osteoarthritis  5. Diabetes  6. Sjogren's disease  7. Pericarditis February 2017     Disposition: Tricia Gonzales hemoglobin continues to be improved. Plan to continue Aranesp 100 g every 2 weeks. She will return for a follow-up visit in 6 weeks.    Ned Card ANP/GNP-BC   12/27/2015  12:19 PM

## 2015-12-27 NOTE — Telephone Encounter (Signed)
per pof to sch pt appt-gave pt copy of avs °

## 2016-01-01 ENCOUNTER — Telehealth: Payer: Self-pay

## 2016-01-01 ENCOUNTER — Telehealth: Payer: Self-pay | Admitting: Internal Medicine

## 2016-01-01 NOTE — Telephone Encounter (Signed)
No, we need to wait 2 years from the previous, which was in 02/2015

## 2016-01-01 NOTE — Telephone Encounter (Signed)
PT wanted to know if she needs to do bone density scan prior to visit with Dr. Cruzita Lederer on 03/23/16

## 2016-01-01 NOTE — Telephone Encounter (Signed)
Called patient regarding if bone density was needed before her next appointment. Dr.Gherghe messaged back stating we had to wait two years and the last one was 02/2015. I advised patient to call back if she had any questions or concerns.

## 2016-01-10 ENCOUNTER — Ambulatory Visit (HOSPITAL_BASED_OUTPATIENT_CLINIC_OR_DEPARTMENT_OTHER): Payer: Medicare Other

## 2016-01-10 ENCOUNTER — Other Ambulatory Visit (HOSPITAL_BASED_OUTPATIENT_CLINIC_OR_DEPARTMENT_OTHER): Payer: Medicare Other

## 2016-01-10 VITALS — BP 128/45 | HR 60 | Temp 97.8°F | Resp 18

## 2016-01-10 DIAGNOSIS — N183 Chronic kidney disease, stage 3 unspecified: Secondary | ICD-10-CM

## 2016-01-10 DIAGNOSIS — N189 Chronic kidney disease, unspecified: Principal | ICD-10-CM

## 2016-01-10 DIAGNOSIS — D631 Anemia in chronic kidney disease: Secondary | ICD-10-CM

## 2016-01-10 LAB — CBC WITH DIFFERENTIAL/PLATELET
BASO%: 0.3 % (ref 0.0–2.0)
Basophils Absolute: 0 10*3/uL (ref 0.0–0.1)
EOS%: 0.4 % (ref 0.0–7.0)
Eosinophils Absolute: 0 10*3/uL (ref 0.0–0.5)
HCT: 33.7 % — ABNORMAL LOW (ref 34.8–46.6)
HGB: 10.9 g/dL — ABNORMAL LOW (ref 11.6–15.9)
LYMPH%: 12.2 % — ABNORMAL LOW (ref 14.0–49.7)
MCH: 27.8 pg (ref 25.1–34.0)
MCHC: 32.2 g/dL (ref 31.5–36.0)
MCV: 86.4 fL (ref 79.5–101.0)
MONO#: 0.5 10*3/uL (ref 0.1–0.9)
MONO%: 6.2 % (ref 0.0–14.0)
NEUT#: 6.4 10*3/uL (ref 1.5–6.5)
NEUT%: 80.9 % — ABNORMAL HIGH (ref 38.4–76.8)
Platelets: 244 10*3/uL (ref 145–400)
RBC: 3.9 10*6/uL (ref 3.70–5.45)
RDW: 17.1 % — ABNORMAL HIGH (ref 11.2–14.5)
WBC: 7.9 10*3/uL (ref 3.9–10.3)
lymph#: 1 10*3/uL (ref 0.9–3.3)

## 2016-01-10 MED ORDER — DARBEPOETIN ALFA 100 MCG/0.5ML IJ SOSY
100.0000 ug | PREFILLED_SYRINGE | Freq: Once | INTRAMUSCULAR | Status: AC
  Administered 2016-01-10: 100 ug via SUBCUTANEOUS
  Filled 2016-01-10: qty 0.5

## 2016-01-22 ENCOUNTER — Telehealth: Payer: Self-pay | Admitting: Internal Medicine

## 2016-01-22 DIAGNOSIS — R52 Pain, unspecified: Secondary | ICD-10-CM | POA: Insufficient documentation

## 2016-01-22 DIAGNOSIS — M19041 Primary osteoarthritis, right hand: Secondary | ICD-10-CM | POA: Insufficient documentation

## 2016-01-22 DIAGNOSIS — M180 Bilateral primary osteoarthritis of first carpometacarpal joints: Secondary | ICD-10-CM | POA: Insufficient documentation

## 2016-01-22 NOTE — Telephone Encounter (Signed)
Spoke with pt.  Okay to take voltaren gel with Eliquis.

## 2016-01-22 NOTE — Telephone Encounter (Signed)
Thinking it's voltaren gel.  They are asking because she is on Eliquis

## 2016-01-22 NOTE — Telephone Encounter (Signed)
New message    Patient calling    Pt c/o medication issue:  1. Name of Medication: volparen gel   2. How are you currently taking this medication (dosage and times per day)? Patient unsure   3. Are you having a reaction (difficulty breathing--STAT)? No   4. What is your medication issue? Dr. Gaynelle Arabian wants to be sure if patient can use this gel  Office # [phone]

## 2016-01-24 ENCOUNTER — Ambulatory Visit (HOSPITAL_BASED_OUTPATIENT_CLINIC_OR_DEPARTMENT_OTHER): Payer: Medicare Other

## 2016-01-24 ENCOUNTER — Other Ambulatory Visit (HOSPITAL_BASED_OUTPATIENT_CLINIC_OR_DEPARTMENT_OTHER): Payer: Medicare Other

## 2016-01-24 VITALS — BP 145/75 | HR 87 | Temp 97.9°F | Resp 20

## 2016-01-24 DIAGNOSIS — N183 Chronic kidney disease, stage 3 unspecified: Secondary | ICD-10-CM

## 2016-01-24 DIAGNOSIS — N189 Chronic kidney disease, unspecified: Principal | ICD-10-CM

## 2016-01-24 DIAGNOSIS — D631 Anemia in chronic kidney disease: Secondary | ICD-10-CM

## 2016-01-24 LAB — CBC WITH DIFFERENTIAL/PLATELET
BASO%: 0.5 % (ref 0.0–2.0)
Basophils Absolute: 0 10*3/uL (ref 0.0–0.1)
EOS%: 0.8 % (ref 0.0–7.0)
Eosinophils Absolute: 0.1 10*3/uL (ref 0.0–0.5)
HCT: 33.3 % — ABNORMAL LOW (ref 34.8–46.6)
HGB: 10.4 g/dL — ABNORMAL LOW (ref 11.6–15.9)
LYMPH%: 12.6 % — ABNORMAL LOW (ref 14.0–49.7)
MCH: 27.6 pg (ref 25.1–34.0)
MCHC: 31.4 g/dL — ABNORMAL LOW (ref 31.5–36.0)
MCV: 88.1 fL (ref 79.5–101.0)
MONO#: 0.4 10*3/uL (ref 0.1–0.9)
MONO%: 6.6 % (ref 0.0–14.0)
NEUT#: 5.2 10*3/uL (ref 1.5–6.5)
NEUT%: 79.5 % — ABNORMAL HIGH (ref 38.4–76.8)
Platelets: 257 10*3/uL (ref 145–400)
RBC: 3.78 10*6/uL (ref 3.70–5.45)
RDW: 17.1 % — ABNORMAL HIGH (ref 11.2–14.5)
WBC: 6.5 10*3/uL (ref 3.9–10.3)
lymph#: 0.8 10*3/uL — ABNORMAL LOW (ref 0.9–3.3)

## 2016-01-24 MED ORDER — DARBEPOETIN ALFA 100 MCG/0.5ML IJ SOSY
100.0000 ug | PREFILLED_SYRINGE | Freq: Once | INTRAMUSCULAR | Status: AC
  Administered 2016-01-24: 100 ug via SUBCUTANEOUS
  Filled 2016-01-24: qty 0.5

## 2016-01-24 NOTE — Patient Instructions (Signed)
Darbepoetin Alfa injection What is this medicine? DARBEPOETIN ALFA (dar be POE e tin AL fa) helps your body make more red blood cells. It is used to treat anemia caused by chronic kidney failure and chemotherapy. This medicine may be used for other purposes; ask your health care provider or pharmacist if you have questions. What should I tell my health care provider before I take this medicine? They need to know if you have any of these conditions: -blood clotting disorders or history of blood clots -cancer patient not on chemotherapy -cystic fibrosis -heart disease, such as angina, heart failure, or a history of a heart attack -hemoglobin level of 12 g/dL or greater -high blood pressure -low levels of folate, iron, or vitamin B12 -seizures -an unusual or allergic reaction to darbepoetin, erythropoietin, albumin, hamster proteins, latex, other medicines, foods, dyes, or preservatives -pregnant or trying to get pregnant -breast-feeding How should I use this medicine? This medicine is for injection into a vein or under the skin. It is usually given by a health care professional in a hospital or clinic setting. If you get this medicine at home, you will be taught how to prepare and give this medicine. Do not shake the solution before you withdraw a dose. Use exactly as directed. Take your medicine at regular intervals. Do not take your medicine more often than directed. It is important that you put your used needles and syringes in a special sharps container. Do not put them in a trash can. If you do not have a sharps container, call your pharmacist or healthcare provider to get one. Talk to your pediatrician regarding the use of this medicine in children. While this medicine may be used in children as young as 1 year for selected conditions, precautions do apply. Overdosage: If you think you have taken too much of this medicine contact a poison control center or emergency room at once. NOTE:  This medicine is only for you. Do not share this medicine with others. What if I miss a dose? If you miss a dose, take it as soon as you can. If it is almost time for your next dose, take only that dose. Do not take double or extra doses. What may interact with this medicine? Do not take this medicine with any of the following medications: -epoetin alfa This list may not describe all possible interactions. Give your health care provider a list of all the medicines, herbs, non-prescription drugs, or dietary supplements you use. Also tell them if you smoke, drink alcohol, or use illegal drugs. Some items may interact with your medicine. What should I watch for while using this medicine? Visit your prescriber or health care professional for regular checks on your progress and for the needed blood tests and blood pressure measurements. It is especially important for the doctor to make sure your hemoglobin level is in the desired range, to limit the risk of potential side effects and to give you the best benefit. Keep all appointments for any recommended tests. Check your blood pressure as directed. Ask your doctor what your blood pressure should be and when you should contact him or her. As your body makes more red blood cells, you may need to take iron, folic acid, or vitamin B supplements. Ask your doctor or health care provider which products are right for you. If you have kidney disease continue dietary restrictions, even though this medication can make you feel better. Talk with your doctor or health care professional about the   foods you eat and the vitamins that you take. What side effects may I notice from receiving this medicine? Side effects that you should report to your doctor or health care professional as soon as possible: -allergic reactions like skin rash, itching or hives, swelling of the face, lips, or tongue -breathing problems -changes in vision -chest pain -confusion, trouble speaking  or understanding -feeling faint or lightheaded, falls -high blood pressure -muscle aches or pains -pain, swelling, warmth in the leg -rapid weight gain -severe headaches -sudden numbness or weakness of the face, arm or leg -trouble walking, dizziness, loss of balance or coordination -seizures (convulsions) -swelling of the ankles, feet, hands -unusually weak or tired Side effects that usually do not require medical attention (report to your doctor or health care professional if they continue or are bothersome): -diarrhea -fever, chills (flu-like symptoms) -headaches -nausea, vomiting -redness, stinging, or swelling at site where injected This list may not describe all possible side effects. Call your doctor for medical advice about side effects. You may report side effects to FDA at 1-800-FDA-1088. Where should I keep my medicine? Keep out of the reach of children. Store in a refrigerator between 2 and 8 degrees C (36 and 46 degrees F). Do not freeze. Do not shake. Throw away any unused portion if using a single-dose vial. Throw away any unused medicine after the expiration date. NOTE: This sheet is a summary. It may not cover all possible information. If you have questions about this medicine, talk to your doctor, pharmacist, or health care provider.    2016, Elsevier/[employer]. (2008-05-22 10:23:57)

## 2016-02-07 ENCOUNTER — Telehealth: Payer: Self-pay | Admitting: Oncology

## 2016-02-07 ENCOUNTER — Other Ambulatory Visit (HOSPITAL_BASED_OUTPATIENT_CLINIC_OR_DEPARTMENT_OTHER): Payer: Medicare Other

## 2016-02-07 ENCOUNTER — Ambulatory Visit: Payer: Medicare Other

## 2016-02-07 ENCOUNTER — Ambulatory Visit (HOSPITAL_BASED_OUTPATIENT_CLINIC_OR_DEPARTMENT_OTHER): Payer: Medicare Other | Admitting: Oncology

## 2016-02-07 VITALS — BP 169/65 | HR 65 | Temp 97.8°F | Resp 19 | Ht 58.5 in | Wt 246.7 lb

## 2016-02-07 DIAGNOSIS — E119 Type 2 diabetes mellitus without complications: Secondary | ICD-10-CM | POA: Diagnosis not present

## 2016-02-07 DIAGNOSIS — M1990 Unspecified osteoarthritis, unspecified site: Secondary | ICD-10-CM

## 2016-02-07 DIAGNOSIS — N189 Chronic kidney disease, unspecified: Secondary | ICD-10-CM

## 2016-02-07 DIAGNOSIS — M3500 Sicca syndrome, unspecified: Secondary | ICD-10-CM

## 2016-02-07 DIAGNOSIS — I4891 Unspecified atrial fibrillation: Secondary | ICD-10-CM

## 2016-02-07 DIAGNOSIS — D631 Anemia in chronic kidney disease: Secondary | ICD-10-CM

## 2016-02-07 DIAGNOSIS — I319 Disease of pericardium, unspecified: Secondary | ICD-10-CM

## 2016-02-07 LAB — CBC WITH DIFFERENTIAL/PLATELET
BASO%: 0.2 % (ref 0.0–2.0)
Basophils Absolute: 0 10*3/uL (ref 0.0–0.1)
EOS%: 0.7 % (ref 0.0–7.0)
Eosinophils Absolute: 0.1 10*3/uL (ref 0.0–0.5)
HCT: 34.5 % — ABNORMAL LOW (ref 34.8–46.6)
HGB: 11 g/dL — ABNORMAL LOW (ref 11.6–15.9)
LYMPH%: 12.4 % — ABNORMAL LOW (ref 14.0–49.7)
MCH: 28 pg (ref 25.1–34.0)
MCHC: 31.8 g/dL (ref 31.5–36.0)
MCV: 88.2 fL (ref 79.5–101.0)
MONO#: 0.5 10*3/uL (ref 0.1–0.9)
MONO%: 6.9 % (ref 0.0–14.0)
NEUT#: 6 10*3/uL (ref 1.5–6.5)
NEUT%: 79.8 % — ABNORMAL HIGH (ref 38.4–76.8)
Platelets: 265 10*3/uL (ref 145–400)
RBC: 3.91 10*6/uL (ref 3.70–5.45)
RDW: 16.1 % — ABNORMAL HIGH (ref 11.2–14.5)
WBC: 7.5 10*3/uL (ref 3.9–10.3)
lymph#: 0.9 10*3/uL (ref 0.9–3.3)

## 2016-02-07 NOTE — Progress Notes (Signed)
  [place] OFFICE PROGRESS NOTE   Diagnosis: Anemia, renal insufficiency  INTERVAL HISTORY:   Ms. Osterlund returns as scheduled. She continues iron. She was last treated with Aranesp 01/24/2016. No new complaint. No symptom of venous or arterial thrombosis.  Objective:  Vital signs in last 24 hours:  Blood pressure (!) 169/65, pulse 65, temperature 97.8 F (36.6 C), temperature source Oral, resp. rate 19, height 4' 10.5" (1.486 m), weight 246 lb 11.2 oz (111.9 kg), SpO2 97 %.    Resp: Coarse rhonchi at the posterior bases bilaterally, no respiratory distress Cardio: Regular rate and rhythm GI: No splenomegaly Vascular: Trace edema at the left greater than right lower leg   Lab Results:  Lab Results  Component Value Date   WBC 7.5 02/07/2016   HGB 11.0 (L) 02/07/2016   HCT 34.5 (L) 02/07/2016   MCV 88.2 02/07/2016   PLT 265 02/07/2016   NEUTROABS 6.0 02/07/2016   Medications: I have reviewed the patient's current medications.  Assessment/Plan: 1. 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 Chronic renal insufficiency  3. Atrial fibrillation-maintained on apixaban  4.  Osteoarthritis  5. Diabetes  6. Sjogren's disease  7. Pericarditis February 2017     Disposition:  Ms. Bungert appears stable. The anemia has responded to Aranesp. We held Aranesp today and will then switch to an every 3 week schedule. She will return for an office visit 04/24/2016.  [name], MD  02/07/2016  12:45 PM

## 2016-02-07 NOTE — Progress Notes (Signed)
Aranesp injection not given, Hgb noted to be at 11.0

## 2016-02-07 NOTE — Telephone Encounter (Signed)
Gave pt cal & avs °

## 2016-02-21 ENCOUNTER — Ambulatory Visit (HOSPITAL_BASED_OUTPATIENT_CLINIC_OR_DEPARTMENT_OTHER): Payer: Medicare Other

## 2016-02-21 ENCOUNTER — Other Ambulatory Visit (HOSPITAL_BASED_OUTPATIENT_CLINIC_OR_DEPARTMENT_OTHER): Payer: Medicare Other

## 2016-02-21 VITALS — BP 145/79 | HR 59 | Temp 97.7°F | Resp 20

## 2016-02-21 DIAGNOSIS — D631 Anemia in chronic kidney disease: Secondary | ICD-10-CM

## 2016-02-21 DIAGNOSIS — N183 Chronic kidney disease, stage 3 unspecified: Secondary | ICD-10-CM

## 2016-02-21 DIAGNOSIS — N189 Chronic kidney disease, unspecified: Principal | ICD-10-CM

## 2016-02-21 LAB — CBC WITH DIFFERENTIAL/PLATELET
BASO%: 0.2 % (ref 0.0–2.0)
Basophils Absolute: 0 10*3/uL (ref 0.0–0.1)
EOS%: 0.9 % (ref 0.0–7.0)
Eosinophils Absolute: 0.1 10*3/uL (ref 0.0–0.5)
HCT: 32 % — ABNORMAL LOW (ref 34.8–46.6)
HGB: 10.2 g/dL — ABNORMAL LOW (ref 11.6–15.9)
LYMPH%: 11.1 % — ABNORMAL LOW (ref 14.0–49.7)
MCH: 28.5 pg (ref 25.1–34.0)
MCHC: 32 g/dL (ref 31.5–36.0)
MCV: 89 fL (ref 79.5–101.0)
MONO#: 0.5 10*3/uL (ref 0.1–0.9)
MONO%: 6.1 % (ref 0.0–14.0)
NEUT#: 6.8 10*3/uL — ABNORMAL HIGH (ref 1.5–6.5)
NEUT%: 81.7 % — ABNORMAL HIGH (ref 38.4–76.8)
Platelets: 236 10*3/uL (ref 145–400)
RBC: 3.59 10*6/uL — ABNORMAL LOW (ref 3.70–5.45)
RDW: 15.7 % — ABNORMAL HIGH (ref 11.2–14.5)
WBC: 8.3 10*3/uL (ref 3.9–10.3)
lymph#: 0.9 10*3/uL (ref 0.9–3.3)

## 2016-02-21 MED ORDER — DARBEPOETIN ALFA 100 MCG/0.5ML IJ SOSY
100.0000 ug | PREFILLED_SYRINGE | Freq: Once | INTRAMUSCULAR | Status: AC
  Administered 2016-02-21: 100 ug via SUBCUTANEOUS
  Filled 2016-02-21: qty 0.5

## 2016-02-21 NOTE — Patient Instructions (Signed)
Darbepoetin Alfa injection What is this medicine? DARBEPOETIN ALFA (dar be POE e tin AL fa) helps your body make more red blood cells. It is used to treat anemia caused by chronic kidney failure and chemotherapy. This medicine may be used for other purposes; ask your health care provider or pharmacist if you have questions. What should I tell my health care provider before I take this medicine? They need to know if you have any of these conditions: -blood clotting disorders or history of blood clots -cancer patient not on chemotherapy -cystic fibrosis -heart disease, such as angina, heart failure, or a history of a heart attack -hemoglobin level of 12 g/dL or greater -high blood pressure -low levels of folate, iron, or vitamin B12 -seizures -an unusual or allergic reaction to darbepoetin, erythropoietin, albumin, hamster proteins, latex, other medicines, foods, dyes, or preservatives -pregnant or trying to get pregnant -breast-feeding How should I use this medicine? This medicine is for injection into a vein or under the skin. It is usually given by a health care professional in a hospital or clinic setting. If you get this medicine at home, you will be taught how to prepare and give this medicine. Do not shake the solution before you withdraw a dose. Use exactly as directed. Take your medicine at regular intervals. Do not take your medicine more often than directed. It is important that you put your used needles and syringes in a special sharps container. Do not put them in a trash can. If you do not have a sharps container, call your pharmacist or healthcare provider to get one. Talk to your pediatrician regarding the use of this medicine in children. While this medicine may be used in children as young as 1 year for selected conditions, precautions do apply. Overdosage: If you think you have taken too much of this medicine contact a poison control center or emergency room at once. NOTE:  This medicine is only for you. Do not share this medicine with others. What if I miss a dose? If you miss a dose, take it as soon as you can. If it is almost time for your next dose, take only that dose. Do not take double or extra doses. What may interact with this medicine? Do not take this medicine with any of the following medications: -epoetin alfa This list may not describe all possible interactions. Give your health care provider a list of all the medicines, herbs, non-prescription drugs, or dietary supplements you use. Also tell them if you smoke, drink alcohol, or use illegal drugs. Some items may interact with your medicine. What should I watch for while using this medicine? Visit your prescriber or health care professional for regular checks on your progress and for the needed blood tests and blood pressure measurements. It is especially important for the doctor to make sure your hemoglobin level is in the desired range, to limit the risk of potential side effects and to give you the best benefit. Keep all appointments for any recommended tests. Check your blood pressure as directed. Ask your doctor what your blood pressure should be and when you should contact him or her. As your body makes more red blood cells, you may need to take iron, folic acid, or vitamin B supplements. Ask your doctor or health care provider which products are right for you. If you have kidney disease continue dietary restrictions, even though this medication can make you feel better. Talk with your doctor or health care professional about the   foods you eat and the vitamins that you take. What side effects may I notice from receiving this medicine? Side effects that you should report to your doctor or health care professional as soon as possible: -allergic reactions like skin rash, itching or hives, swelling of the face, lips, or tongue -breathing problems -changes in vision -chest pain -confusion, trouble speaking  or understanding -feeling faint or lightheaded, falls -high blood pressure -muscle aches or pains -pain, swelling, warmth in the leg -rapid weight gain -severe headaches -sudden numbness or weakness of the face, arm or leg -trouble walking, dizziness, loss of balance or coordination -seizures (convulsions) -swelling of the ankles, feet, hands -unusually weak or tired Side effects that usually do not require medical attention (report to your doctor or health care professional if they continue or are bothersome): -diarrhea -fever, chills (flu-like symptoms) -headaches -nausea, vomiting -redness, stinging, or swelling at site where injected This list may not describe all possible side effects. Call your doctor for medical advice about side effects. You may report side effects to FDA at 1-800-FDA-1088. Where should I keep my medicine? Keep out of the reach of children. Store in a refrigerator between 2 and 8 degrees C (36 and 46 degrees F). Do not freeze. Do not shake. Throw away any unused portion if using a single-dose vial. Throw away any unused medicine after the expiration date. NOTE: This sheet is a summary. It may not cover all possible information. If you have questions about this medicine, talk to your doctor, pharmacist, or health care provider.    2016, Elsevier/[employer]. (2008-05-22 10:23:57)

## 2016-03-12 ENCOUNTER — Other Ambulatory Visit: Payer: Self-pay | Admitting: *Deleted

## 2016-03-12 DIAGNOSIS — Z862 Personal history of diseases of the blood and blood-forming organs and certain disorders involving the immune mechanism: Secondary | ICD-10-CM

## 2016-03-13 ENCOUNTER — Other Ambulatory Visit (HOSPITAL_BASED_OUTPATIENT_CLINIC_OR_DEPARTMENT_OTHER): Payer: Medicare Other

## 2016-03-13 ENCOUNTER — Ambulatory Visit (HOSPITAL_BASED_OUTPATIENT_CLINIC_OR_DEPARTMENT_OTHER): Payer: Medicare Other

## 2016-03-13 VITALS — BP 127/83 | HR 61 | Temp 97.7°F | Resp 18

## 2016-03-13 DIAGNOSIS — D631 Anemia in chronic kidney disease: Secondary | ICD-10-CM

## 2016-03-13 DIAGNOSIS — N183 Chronic kidney disease, stage 3 unspecified: Secondary | ICD-10-CM

## 2016-03-13 DIAGNOSIS — Z862 Personal history of diseases of the blood and blood-forming organs and certain disorders involving the immune mechanism: Secondary | ICD-10-CM

## 2016-03-13 DIAGNOSIS — N189 Chronic kidney disease, unspecified: Principal | ICD-10-CM

## 2016-03-13 LAB — CBC WITH DIFFERENTIAL/PLATELET
BASO%: 0.3 % (ref 0.0–2.0)
Basophils Absolute: 0 10*3/uL (ref 0.0–0.1)
EOS%: 1.2 % (ref 0.0–7.0)
Eosinophils Absolute: 0.1 10*3/uL (ref 0.0–0.5)
HCT: 30.9 % — ABNORMAL LOW (ref 34.8–46.6)
HGB: 9.8 g/dL — ABNORMAL LOW (ref 11.6–15.9)
LYMPH%: 11.9 % — ABNORMAL LOW (ref 14.0–49.7)
MCH: 28.9 pg (ref 25.1–34.0)
MCHC: 31.8 g/dL (ref 31.5–36.0)
MCV: 90.7 fL (ref 79.5–101.0)
MONO#: 0.5 10*3/uL (ref 0.1–0.9)
MONO%: 6.4 % (ref 0.0–14.0)
NEUT#: 6.2 10*3/uL (ref 1.5–6.5)
NEUT%: 80.2 % — ABNORMAL HIGH (ref 38.4–76.8)
Platelets: 255 10*3/uL (ref 145–400)
RBC: 3.4 10*6/uL — ABNORMAL LOW (ref 3.70–5.45)
RDW: 14.7 % — ABNORMAL HIGH (ref 11.2–14.5)
WBC: 7.7 10*3/uL (ref 3.9–10.3)
lymph#: 0.9 10*3/uL (ref 0.9–3.3)

## 2016-03-13 MED ORDER — DARBEPOETIN ALFA 100 MCG/0.5ML IJ SOSY
100.0000 ug | PREFILLED_SYRINGE | Freq: Once | INTRAMUSCULAR | Status: AC
  Administered 2016-03-13: 100 ug via SUBCUTANEOUS
  Filled 2016-03-13: qty 0.5

## 2016-03-13 NOTE — Patient Instructions (Signed)
Darbepoetin Alfa injection What is this medicine? DARBEPOETIN ALFA (dar be POE e tin AL fa) helps your body make more red blood cells. It is used to treat anemia caused by chronic kidney failure and chemotherapy. This medicine may be used for other purposes; ask your health care provider or pharmacist if you have questions. What should I tell my health care provider before I take this medicine? They need to know if you have any of these conditions: -blood clotting disorders or history of blood clots -cancer patient not on chemotherapy -cystic fibrosis -heart disease, such as angina, heart failure, or a history of a heart attack -hemoglobin level of 12 g/dL or greater -high blood pressure -low levels of folate, iron, or vitamin B12 -seizures -an unusual or allergic reaction to darbepoetin, erythropoietin, albumin, hamster proteins, latex, other medicines, foods, dyes, or preservatives -pregnant or trying to get pregnant -breast-feeding How should I use this medicine? This medicine is for injection into a vein or under the skin. It is usually given by a health care professional in a hospital or clinic setting. If you get this medicine at home, you will be taught how to prepare and give this medicine. Do not shake the solution before you withdraw a dose. Use exactly as directed. Take your medicine at regular intervals. Do not take your medicine more often than directed. It is important that you put your used needles and syringes in a special sharps container. Do not put them in a trash can. If you do not have a sharps container, call your pharmacist or healthcare provider to get one. Talk to your pediatrician regarding the use of this medicine in children. While this medicine may be used in children as young as 1 year for selected conditions, precautions do apply. Overdosage: If you think you have taken too much of this medicine contact a poison control center or emergency room at once. NOTE:  This medicine is only for you. Do not share this medicine with others. What if I miss a dose? If you miss a dose, take it as soon as you can. If it is almost time for your next dose, take only that dose. Do not take double or extra doses. What may interact with this medicine? Do not take this medicine with any of the following medications: -epoetin alfa This list may not describe all possible interactions. Give your health care provider a list of all the medicines, herbs, non-prescription drugs, or dietary supplements you use. Also tell them if you smoke, drink alcohol, or use illegal drugs. Some items may interact with your medicine. What should I watch for while using this medicine? Visit your prescriber or health care professional for regular checks on your progress and for the needed blood tests and blood pressure measurements. It is especially important for the doctor to make sure your hemoglobin level is in the desired range, to limit the risk of potential side effects and to give you the best benefit. Keep all appointments for any recommended tests. Check your blood pressure as directed. Ask your doctor what your blood pressure should be and when you should contact him or her. As your body makes more red blood cells, you may need to take iron, folic acid, or vitamin B supplements. Ask your doctor or health care provider which products are right for you. If you have kidney disease continue dietary restrictions, even though this medication can make you feel better. Talk with your doctor or health care professional about the   foods you eat and the vitamins that you take. What side effects may I notice from receiving this medicine? Side effects that you should report to your doctor or health care professional as soon as possible: -allergic reactions like skin rash, itching or hives, swelling of the face, lips, or tongue -breathing problems -changes in vision -chest pain -confusion, trouble speaking  or understanding -feeling faint or lightheaded, falls -high blood pressure -muscle aches or pains -pain, swelling, warmth in the leg -rapid weight gain -severe headaches -sudden numbness or weakness of the face, arm or leg -trouble walking, dizziness, loss of balance or coordination -seizures (convulsions) -swelling of the ankles, feet, hands -unusually weak or tired Side effects that usually do not require medical attention (report to your doctor or health care professional if they continue or are bothersome): -diarrhea -fever, chills (flu-like symptoms) -headaches -nausea, vomiting -redness, stinging, or swelling at site where injected This list may not describe all possible side effects. Call your doctor for medical advice about side effects. You may report side effects to FDA at 1-800-FDA-1088. Where should I keep my medicine? Keep out of the reach of children. Store in a refrigerator between 2 and 8 degrees C (36 and 46 degrees F). Do not freeze. Do not shake. Throw away any unused portion if using a single-dose vial. Throw away any unused medicine after the expiration date. NOTE: This sheet is a summary. It may not cover all possible information. If you have questions about this medicine, talk to your doctor, pharmacist, or health care provider.    2016, Elsevier/[employer]. (2008-05-22 10:23:57)

## 2016-03-18 ENCOUNTER — Ambulatory Visit (INDEPENDENT_AMBULATORY_CARE_PROVIDER_SITE_OTHER): Payer: Medicare Other | Admitting: *Deleted

## 2016-03-18 DIAGNOSIS — I442 Atrioventricular block, complete: Secondary | ICD-10-CM

## 2016-03-18 NOTE — Progress Notes (Signed)
Remote pacemaker transmission.   

## 2016-03-23 ENCOUNTER — Encounter: Payer: Self-pay | Admitting: Internal Medicine

## 2016-03-23 ENCOUNTER — Ambulatory Visit (INDEPENDENT_AMBULATORY_CARE_PROVIDER_SITE_OTHER): Payer: Medicare Other | Admitting: Internal Medicine

## 2016-03-23 VITALS — BP 132/78 | HR 62 | Ht 58.5 in | Wt 248.0 lb

## 2016-03-23 DIAGNOSIS — Z23 Encounter for immunization: Secondary | ICD-10-CM | POA: Diagnosis not present

## 2016-03-23 DIAGNOSIS — M810 Age-related osteoporosis without current pathological fracture: Secondary | ICD-10-CM

## 2016-03-23 LAB — VITAMIN D 25 HYDROXY (VIT D DEFICIENCY, FRACTURES): VITD: 42.06 ng/mL (ref 30.00–100.00)

## 2016-03-23 NOTE — Progress Notes (Signed)
Patient ID: Tricia Gonzales, female   DOB: 21-Nov-1940, 75 y.o.   MRN: [id number]   HPI  Tricia Gonzales is a 75 y.o.-year-old female, returning for f/u for osteoporosis. Last visit 1 year ago.  Since last visit: she was dx'ed with Afib >> now has a pacemaker.  She had pain in L knee >> no fx's >> now walking with cane.  Reviewed and addended hx: Pt was dx with osteopenia several years ago, then OP in 12/2014.  She denies fractures or falls. No dizziness/vertigo/orthostasis. Had 3 TKR surgeries.  I reviewed pt's DEXA scans: Date L1-L3 T score FN T score  01/15/2015 -1.1 RFN: -4.1 LFN: -3.0   She has been on the following OP treatments:  - bisphosphonates (Fosamax, Actonel) x 2 years >> stopped 2010  We started Prolia at last visit >> had 2 injections. Tolerating this well.   She has GERD >> on Protonix. Also, h/o esophageal stricture.  She has a h/o vitamin D deficiency in the past >> was on Ergocalciferol 50,000 IU  In 2006 years ago (Dr Rockwell [place] - rheumatology).  Pt takes calcium and vitamin D (1 MVI daily) - stopped extra vit D in 06/2014 - did not restart 2000 IU as advised at last visit. She also eats dairy and green, leafy, vegetables.   Last vit D level was: Lab Results  Component Value Date   VD25OH 30.93 03/22/2015   No weight bearing exercises.   She has a h/o kidney stone x1 in her 58s.   She does not take high vitamin A doses.  She was on chronic glucocorticoid tx for Lipodermatosclerosis (Dr Ubaldo Glassing) >> now off. Also, was getting injections for Low back pain, shoulder, knee.  SPEP/UPEP normal  No h/o hyper/hypocalcemia. No h/o hyperparathyroidism.  Lab Results  Component Value Date   CALCIUM 9.4 08/28/2015   CALCIUM 9.2 08/26/2015   CALCIUM 9.8 08/22/2015   CALCIUM 9.0 08/13/2015   CALCIUM 8.3 (L) 08/13/2015   CALCIUM 8.3 (L) 08/12/2015   CALCIUM 7.9 (L) 08/11/2015   CALCIUM 7.7 (L) 08/10/2015   CALCIUM 7.5 (L) 08/09/2015   CALCIUM 7.4 (L) 08/08/2015   No  h/o thyrotoxicosis. Reviewed TSH recent levels:  Lab Results  Component Value Date   TSH 3.10 03/22/2015   TSH 6.035 (H) 09/15/2011   + h/o stage 3 CKD. Last BUN/Cr - stable per Dr Clover Mealy. Per my records, improving: Lab Results  Component Value Date   BUN 52 (H) 08/28/2015   CREATININE 1.66 (H) 08/28/2015   Menopause was at 75 y/o.   Pt does not have a known FH of osteoporosis, but her mother had a hip fx.  I reviewed her chart and she also has a history of DM2 - On glipizide XL and Januvia, and now insulin. She also has a history of hypertension and hyperlipidemia.  ROS: Constitutional: no weight gain, no fatigue, no subjective hypo- or hyperthermia Eyes: no blurry vision, no xerophthalmia ENT:no sore throat, no nodules palpated in throat, no dysphagia/no odynophagia Cardiovascular: no CP/SOB/no palpitations/+ leg swelling Respiratory: no cough/SOB/wheezing Gastrointestinal: no N/V/D/C Musculoskeletal: + muscle/+ joint aches Skin: no rashes, no hair loss Neurological: no tremors/numbness/tingling/dizziness, + HA  I reviewed pt's medications, allergies, PMH, social hx, family hx, and changes were documented in the history of present illness. Otherwise, unchanged from my initial visit note. She stopped Glipizide and started Basaglar since last visit. She also started EPO.   Past Medical History:  Diagnosis Date  . Anemia   .  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 Anxiety   . Arthritis   . Atrial fibrillation ([place])   . Chronic back pain   . Chronic kidney disease (CKD), stage III (moderate)   . Depression   . Diverticulosis   . Esophageal stricture   . Gastritis   . Hiatal hernia   . History of blood transfusion    "@ least w/1st knee OR"  . History of gout   . Hyperlipidemia   . Hypertension   . Intestinal obstruction   . Kidney stones   . Obesity   . Osteoarthritis   . Pericarditis   . Presence of permanent cardiac pacemaker   . Sjogren's disease  ([place])   . Type II diabetes mellitus ([place])   . Vitamin B12 deficiency    Past Surgical History:  Procedure Laterality Date  . ABDOMINAL HYSTERECTOMY    . CHOLECYSTECTOMY OPEN    . COLECTOMY     for rectovaginal fistula  . COLONOSCOPY  01/07/2012   Procedure: COLONOSCOPY;  Surgeon: Lafayette Dragon, MD;  Location: [facility];  Service: Endoscopy;  Laterality: N/A;  . EP IMPLANTABLE DEVICE N/A 06/18/2015   Procedure: Pacemaker Implant;  Surgeon: Evans Lance, MD;  Location: [facility];  Service: Cardiovascular;  Laterality: N/A;  . ESOPHAGOGASTRODUODENOSCOPY (EGD) WITH ESOPHAGEAL DILATION    . ESOPHAGOGASTRODUODENOSCOPY (EGD) WITH PROPOFOL N/A 08/12/2015   Procedure: ESOPHAGOGASTRODUODENOSCOPY (EGD) WITH PROPOFOL;  Surgeon: Manus Gunning, MD;  Location: [facility];  Service: Gastroenterology;  Laterality: N/A;  . INSERT / REPLACE / REMOVE PACEMAKER    . JOINT REPLACEMENT    . KNEE ARTHROSCOPY Right   . REVISION TOTAL KNEE ARTHROPLASTY Left   . TOTAL KNEE ARTHROPLASTY Bilateral    Social History   Social History  . Marital Status: Divorced    Spouse Name: N/A  . Number of Children: 2   Occupational History  . retired   Social History Main Topics  . Smoking status: Former Smoker    Quit date: 06/23/1983  . Smokeless tobacco: Never Used  . Alcohol Use: Yes     Comment: occasionally - wine, once a week, x2 drinks  . Drug Use: No   Current Outpatient Prescriptions on File Prior to Visit  Medication Sig Dispense Refill  . ACCU-CHEK AVIVA PLUS test strip 1 each by Other route daily.     Marland Kitchen acetaminophen (TYLENOL) 650 MG CR tablet Take 1,300 mg by mouth at bedtime as needed for pain.    Marland Kitchen apixaban (ELIQUIS) 2.5 MG TABS tablet Take 1 tablet (2.5 mg total) by mouth 2 (two) times daily. 180 tablet 3  . aspirin EC 81 MG tablet Take 81 mg by mouth daily.    . cholecalciferol (VITAMIN D) 1000 UNITS tablet Take 2,000 Units by mouth at bedtime. Reported on 11/15/2015    .  denosumab (PROLIA) 60 MG/ML SOLN injection Inject 60 mg into the skin every 6 (six) months. Administer in upper arm, thigh, or abdomen    . doxazosin (CARDURA) 4 MG tablet Take 4 mg by mouth daily.    Marland Kitchen Epoetin Alfa (PROCRIT IJ) Inject as directed.    . ferrous sulfate 325 (65 FE) MG EC tablet Take 1 tablet (325 mg total) by mouth 2 (two) times daily. (Patient taking differently: Take 325 mg by mouth daily with breakfast. Once a day) 60 tablet 3  . gabapentin (NEURONTIN) 300 MG capsule Take 300 mg by mouth 2 (two) times daily.     Marland Kitchen  glipiZIDE (GLUCOTROL XL) 10 MG 24 hr tablet Take 10 mg by mouth 2 (two) times daily.    Marland Kitchen HYDROcodone-acetaminophen (NORCO/VICODIN) 5-325 MG tablet Take 1 tablet by mouth every 6 (six) hours as needed for moderate pain.    Marland Kitchen irbesartan (AVAPRO) 300 MG tablet Take 300 mg by mouth daily.    Marland Kitchen JANUVIA 50 MG tablet     . metoprolol succinate (TOPROL-XL) 25 MG 24 hr tablet Take 1.5 tablets (37.5 mg total) by mouth daily. 90 tablet 3  . montelukast (SINGULAIR) 10 MG tablet Take 10 mg by mouth at bedtime.     . Multiple Vitamin (MULITIVITAMIN WITH MINERALS) TABS Take 1 tablet by mouth daily.    . pantoprazole (PROTONIX) 40 MG tablet Take 1 tablet (40 mg total) by mouth daily. 30 tablet 1  . pentoxifylline (TRENTAL) 400 MG CR tablet Take 400 mg by mouth daily.    . polyethylene glycol powder (GLYCOLAX/MIRALAX) powder miz 17 grams in 8 oz of water up to two times daily 255 g 3  . pravastatin (PRAVACHOL) 40 MG tablet Take 40 mg by mouth daily.     Marland Kitchen ULORIC 40 MG tablet Take 40 mg by mouth daily.      No current facility-administered medications on file prior to visit.    Allergies  Allergen Reactions  . Norvasc [Amlodipine Besylate] Shortness Of Breath  . Glucophage [Metformin Hydrochloride] Other (See Comments)    "increases creatinine"  . Lisinopril     Headache   . Losartan     Headache   . Morphine And Related Other (See Comments)    "hallucinations"  .  Simvastatin Other (See Comments)    Body aches  . Trulicity [Dulaglutide] Other (See Comments)    Severe mood swings   Family History  Problem Relation Age of Onset  . Diabetes Mother   . Diabetes Brother   . Colon cancer Neg Hx   . Hypertension Sister   . Heart attack Neg Hx   . Stroke Neg Hx   Father with lung CA.  PE: BP 132/78 (BP Location: Left Arm, Patient Position: Sitting)   Pulse 62   Ht 4' 10.5" (1.486 m)   Wt 248 lb (112.5 kg)   SpO2 96%   BMI 50.95 kg/m  Wt Readings from Last 3 Encounters:  03/23/16 248 lb (112.5 kg)  02/07/16 246 lb 11.2 oz (111.9 kg)  12/27/15 246 lb 1.6 oz (111.6 kg)   Constitutional: obese, in NAD. + kyphosis, walks with cane Eyes: PERRLA, EOMI, no exophthalmos ENT: moist mucous membranes, no thyromegaly, no cervical lymphadenopathy Cardiovascular: RRR, No MRG, + B leg swelling, pitting Respiratory: CTA B Gastrointestinal: abdomen soft, NT, ND, BS+ Musculoskeletal: no deformities, strength intact in all 4 Skin: moist, warm, no rashes Neurological: no tremor with outstretched hands, DTR normal in all 4  Assessment: 1. Osteoporosis   Her GFR was low, however we added Prolia in 02/2015, based on the following reference: J Bone Miner Res. 2012 Jul; 27(7): 1610-9604.  Published online 2012 Mar 28. doi: 10.1002/jbmr.J5811397  PMCID: [id number]  A Single-Dose Study of Denosumab in Patients With Various Degrees of Renal Impairment  Plan: 1. Osteoporosis - likely postmenopausal + 2/2 prolonged steroid use - we reviewed her latest DEXA scan results together, and I explained that her hip T scores are the worst >> recommended to start walking more. She now walks with a cane 2/2 knee pain.   - she is doing well on Prolia, has had  2 doses already. We discussed that we can continue with Prolia for at least 6 years, However, based on the new studies, we can even extend the treatment to 10 years. - we reviewed her dietary and supplemental calcium and  vitamin D intake - She did not at the 2000 units of vitamin D dose that I suggested at last visit, as her vitamin D was at the lower limit of normal >> We'll recheck it again today to see if she needs to add this. - Since last visit, she had multiple myeloma workup, and this was negative - I also reviewed her latest kidney tests, and these appear to be improving. - Will continue with Prolia injections and  plan to check a new DEXA scan after 01/14/2017 - will see pt back in a year Office Visit on 03/23/2016  Component Date Value Ref Range Status  . VITD 03/23/2016 42.06  30.00 - 100.00 ng/mL Final   Normal vitamin D.  Philemon Kingdom, MD PhD [facility]

## 2016-03-23 NOTE — Patient Instructions (Signed)
Please stop at the lab.  Please continue Prolia.  Please return in 1 year.

## 2016-03-24 ENCOUNTER — Telehealth: Payer: Self-pay

## 2016-03-24 NOTE — Telephone Encounter (Signed)
Called and left message with lab result. Advised to stay on medication, gave call back number if any questions.

## 2016-03-25 ENCOUNTER — Encounter: Payer: Self-pay | Admitting: Cardiology

## 2016-04-03 ENCOUNTER — Other Ambulatory Visit (HOSPITAL_BASED_OUTPATIENT_CLINIC_OR_DEPARTMENT_OTHER): Payer: Medicare Other

## 2016-04-03 ENCOUNTER — Ambulatory Visit (HOSPITAL_BASED_OUTPATIENT_CLINIC_OR_DEPARTMENT_OTHER): Payer: Medicare Other

## 2016-04-03 VITALS — BP 122/62 | HR 64 | Temp 97.5°F | Resp 20

## 2016-04-03 DIAGNOSIS — N183 Chronic kidney disease, stage 3 unspecified: Secondary | ICD-10-CM

## 2016-04-03 DIAGNOSIS — N189 Chronic kidney disease, unspecified: Principal | ICD-10-CM

## 2016-04-03 DIAGNOSIS — D631 Anemia in chronic kidney disease: Secondary | ICD-10-CM

## 2016-04-03 DIAGNOSIS — Z862 Personal history of diseases of the blood and blood-forming organs and certain disorders involving the immune mechanism: Secondary | ICD-10-CM

## 2016-04-03 DIAGNOSIS — M810 Age-related osteoporosis without current pathological fracture: Secondary | ICD-10-CM

## 2016-04-03 LAB — CBC WITH DIFFERENTIAL/PLATELET
BASO%: 0.1 % (ref 0.0–2.0)
Basophils Absolute: 0 10*3/uL (ref 0.0–0.1)
EOS%: 1 % (ref 0.0–7.0)
Eosinophils Absolute: 0.1 10*3/uL (ref 0.0–0.5)
HCT: 29.7 % — ABNORMAL LOW (ref 34.8–46.6)
HGB: 9.3 g/dL — ABNORMAL LOW (ref 11.6–15.9)
LYMPH%: 11.7 % — ABNORMAL LOW (ref 14.0–49.7)
MCH: 29.3 pg (ref 25.1–34.0)
MCHC: 31.3 g/dL — ABNORMAL LOW (ref 31.5–36.0)
MCV: 93.7 fL (ref 79.5–101.0)
MONO#: 0.4 10*3/uL (ref 0.1–0.9)
MONO%: 5.8 % (ref 0.0–14.0)
NEUT#: 5.6 10*3/uL (ref 1.5–6.5)
NEUT%: 81.4 % — ABNORMAL HIGH (ref 38.4–76.8)
Platelets: 242 10*3/uL (ref 145–400)
RBC: 3.17 10*6/uL — ABNORMAL LOW (ref 3.70–5.45)
RDW: 14 % (ref 11.2–14.5)
WBC: 6.8 10*3/uL (ref 3.9–10.3)
lymph#: 0.8 10*3/uL — ABNORMAL LOW (ref 0.9–3.3)

## 2016-04-03 MED ORDER — DARBEPOETIN ALFA 100 MCG/0.5ML IJ SOSY
100.0000 ug | PREFILLED_SYRINGE | Freq: Once | INTRAMUSCULAR | Status: AC
  Administered 2016-04-03: 100 ug via SUBCUTANEOUS
  Filled 2016-04-03: qty 0.5

## 2016-04-03 NOTE — Patient Instructions (Signed)
Darbepoetin Alfa injection What is this medicine? DARBEPOETIN ALFA (dar be POE e tin AL fa) helps your body make more red blood cells. It is used to treat anemia caused by chronic kidney failure and chemotherapy. This medicine may be used for other purposes; ask your health care provider or pharmacist if you have questions. What should I tell my health care provider before I take this medicine? They need to know if you have any of these conditions: -blood clotting disorders or history of blood clots -cancer patient not on chemotherapy -cystic fibrosis -heart disease, such as angina, heart failure, or a history of a heart attack -hemoglobin level of 12 g/dL or greater -high blood pressure -low levels of folate, iron, or vitamin B12 -seizures -an unusual or allergic reaction to darbepoetin, erythropoietin, albumin, hamster proteins, latex, other medicines, foods, dyes, or preservatives -pregnant or trying to get pregnant -breast-feeding How should I use this medicine? This medicine is for injection into a vein or under the skin. It is usually given by a health care professional in a hospital or clinic setting. If you get this medicine at home, you will be taught how to prepare and give this medicine. Do not shake the solution before you withdraw a dose. Use exactly as directed. Take your medicine at regular intervals. Do not take your medicine more often than directed. It is important that you put your used needles and syringes in a special sharps container. Do not put them in a trash can. If you do not have a sharps container, call your pharmacist or healthcare provider to get one. Talk to your pediatrician regarding the use of this medicine in children. While this medicine may be used in children as young as 1 year for selected conditions, precautions do apply. Overdosage: If you think you have taken too much of this medicine contact a poison control center or emergency room at once. NOTE:  This medicine is only for you. Do not share this medicine with others. What if I miss a dose? If you miss a dose, take it as soon as you can. If it is almost time for your next dose, take only that dose. Do not take double or extra doses. What may interact with this medicine? Do not take this medicine with any of the following medications: -epoetin alfa This list may not describe all possible interactions. Give your health care provider a list of all the medicines, herbs, non-prescription drugs, or dietary supplements you use. Also tell them if you smoke, drink alcohol, or use illegal drugs. Some items may interact with your medicine. What should I watch for while using this medicine? Visit your prescriber or health care professional for regular checks on your progress and for the needed blood tests and blood pressure measurements. It is especially important for the doctor to make sure your hemoglobin level is in the desired range, to limit the risk of potential side effects and to give you the best benefit. Keep all appointments for any recommended tests. Check your blood pressure as directed. Ask your doctor what your blood pressure should be and when you should contact him or her. As your body makes more red blood cells, you may need to take iron, folic acid, or vitamin B supplements. Ask your doctor or health care provider which products are right for you. If you have kidney disease continue dietary restrictions, even though this medication can make you feel better. Talk with your doctor or health care professional about the  foods you eat and the vitamins that you take. What side effects may I notice from receiving this medicine? Side effects that you should report to your doctor or health care professional as soon as possible: -allergic reactions like skin rash, itching or hives, swelling of the face, lips, or tongue -breathing problems -changes in vision -chest pain -confusion, trouble speaking  or understanding -feeling faint or lightheaded, falls -high blood pressure -muscle aches or pains -pain, swelling, warmth in the leg -rapid weight gain -severe headaches -sudden numbness or weakness of the face, arm or leg -trouble walking, dizziness, loss of balance or coordination -seizures (convulsions) -swelling of the ankles, feet, hands -unusually weak or tired Side effects that usually do not require medical attention (report to your doctor or health care professional if they continue or are bothersome): -diarrhea -fever, chills (flu-like symptoms) -headaches -nausea, vomiting -redness, stinging, or swelling at site where injected This list may not describe all possible side effects. Call your doctor for medical advice about side effects. You may report side effects to FDA at 1-800-FDA-1088. Where should I keep my medicine? Keep out of the reach of children. Store in a refrigerator between 2 and 8 degrees C (36 and 46 degrees F). Do not freeze. Do not shake. Throw away any unused portion if using a single-dose vial. Throw away any unused medicine after the expiration date. NOTE: This sheet is a summary. It may not cover all possible information. If you have questions about this medicine, talk to your doctor, pharmacist, or health care provider.    2016, [employer]vier/[employer] Standard. (2008-05-22 10:23:57)

## 2016-04-15 LAB — CUP PACEART REMOTE DEVICE CHECK
Battery Remaining Longevity: 106 mo
Battery Voltage: 3.01 V
Brady Statistic AP VP Percent: 64.07 %
Brady Statistic AP VS Percent: 3.79 %
Brady Statistic AS VP Percent: 1.71 %
Brady Statistic AS VS Percent: 30.43 %
Brady Statistic RA Percent Paced: 67.86 %
Brady Statistic RV Percent Paced: 65.78 %
Date Time Interrogation Session: 20170927151145
Implantable Lead Implant Date: 20161227
Implantable Lead Implant Date: 20161227
Implantable Lead Location: 753859
Implantable Lead Location: 753860
Implantable Lead Model: 5076
Implantable Lead Model: 5076
Lead Channel Impedance Value: 361 Ohm
Lead Channel Impedance Value: 418 Ohm
Lead Channel Impedance Value: 418 Ohm
Lead Channel Impedance Value: 494 Ohm
Lead Channel Pacing Threshold Amplitude: 0.625 V
Lead Channel Pacing Threshold Amplitude: 0.75 V
Lead Channel Pacing Threshold Pulse Width: 0.4 ms
Lead Channel Pacing Threshold Pulse Width: 0.4 ms
Lead Channel Sensing Intrinsic Amplitude: 1 mV
Lead Channel Sensing Intrinsic Amplitude: 1 mV
Lead Channel Sensing Intrinsic Amplitude: 11.375 mV
Lead Channel Sensing Intrinsic Amplitude: 11.375 mV
Lead Channel Setting Pacing Amplitude: 2 V
Lead Channel Setting Pacing Amplitude: 2.5 V
Lead Channel Setting Pacing Pulse Width: 0.4 ms
Lead Channel Setting Sensing Sensitivity: 2.8 mV

## 2016-04-23 ENCOUNTER — Telehealth: Payer: Self-pay | Admitting: *Deleted

## 2016-04-23 NOTE — Telephone Encounter (Signed)
Call from pt requesting to reschedule 11/3 appointments to next week. She hurt her knee and won't be able to come in. Will move lab/office and injection to 11/9 with Lattie Haw.

## 2016-04-24 ENCOUNTER — Ambulatory Visit: Payer: Medicare Other | Admitting: Nurse Practitioner

## 2016-04-24 ENCOUNTER — Ambulatory Visit: Payer: Medicare Other

## 2016-04-24 ENCOUNTER — Other Ambulatory Visit: Payer: Medicare Other

## 2016-04-30 ENCOUNTER — Ambulatory Visit (HOSPITAL_BASED_OUTPATIENT_CLINIC_OR_DEPARTMENT_OTHER): Payer: Medicare Other

## 2016-04-30 ENCOUNTER — Telehealth: Payer: Self-pay

## 2016-04-30 ENCOUNTER — Ambulatory Visit: Payer: Medicare Other | Admitting: Nurse Practitioner

## 2016-04-30 ENCOUNTER — Other Ambulatory Visit (HOSPITAL_BASED_OUTPATIENT_CLINIC_OR_DEPARTMENT_OTHER): Payer: Medicare Other

## 2016-04-30 VITALS — BP 151/69 | HR 60 | Temp 97.8°F

## 2016-04-30 DIAGNOSIS — D631 Anemia in chronic kidney disease: Secondary | ICD-10-CM

## 2016-04-30 DIAGNOSIS — N183 Chronic kidney disease, stage 3 unspecified: Secondary | ICD-10-CM

## 2016-04-30 DIAGNOSIS — N189 Chronic kidney disease, unspecified: Principal | ICD-10-CM

## 2016-04-30 LAB — CBC WITH DIFFERENTIAL/PLATELET
BASO%: 0.2 % (ref 0.0–2.0)
Basophils Absolute: 0 10*3/uL (ref 0.0–0.1)
EOS%: 0.5 % (ref 0.0–7.0)
Eosinophils Absolute: 0.1 10*3/uL (ref 0.0–0.5)
HCT: 28.3 % — ABNORMAL LOW (ref 34.8–46.6)
HGB: 9.3 g/dL — ABNORMAL LOW (ref 11.6–15.9)
LYMPH%: 9.4 % — ABNORMAL LOW (ref 14.0–49.7)
MCH: 29.6 pg (ref 25.1–34.0)
MCHC: 32.7 g/dL (ref 31.5–36.0)
MCV: 90.6 fL (ref 79.5–101.0)
MONO#: 0.5 10*3/uL (ref 0.1–0.9)
MONO%: 4.7 % (ref 0.0–14.0)
NEUT#: 8.5 10*3/uL — ABNORMAL HIGH (ref 1.5–6.5)
NEUT%: 85.2 % — ABNORMAL HIGH (ref 38.4–76.8)
Platelets: 304 10*3/uL (ref 145–400)
RBC: 3.13 10*6/uL — ABNORMAL LOW (ref 3.70–5.45)
RDW: 13.7 % (ref 11.2–14.5)
WBC: 10 10*3/uL (ref 3.9–10.3)
lymph#: 0.9 10*3/uL (ref 0.9–3.3)

## 2016-04-30 MED ORDER — DARBEPOETIN ALFA 100 MCG/0.5ML IJ SOSY
100.0000 ug | PREFILLED_SYRINGE | Freq: Once | INTRAMUSCULAR | Status: AC
  Administered 2016-04-30: 100 ug via SUBCUTANEOUS
  Filled 2016-04-30: qty 0.5

## 2016-04-30 NOTE — Telephone Encounter (Signed)
Called pt to inform her she will see the provider at her next injection appt and not today d/t accidental double booking. Pt agrees to plan and will be in at 245 today and 1230 on 11/30. Pt knows to call interm with any questions or concerns.

## 2016-04-30 NOTE — Patient Instructions (Signed)
Darbepoetin Alfa injection What is this medicine? DARBEPOETIN ALFA (dar be POE e tin AL fa) helps your body make more red blood cells. It is used to treat anemia caused by chronic kidney failure and chemotherapy. This medicine may be used for other purposes; ask your health care provider or pharmacist if you have questions. What should I tell my health care provider before I take this medicine? They need to know if you have any of these conditions: -blood clotting disorders or history of blood clots -cancer patient not on chemotherapy -cystic fibrosis -heart disease, such as angina, heart failure, or a history of a heart attack -hemoglobin level of 12 g/dL or greater -high blood pressure -low levels of folate, iron, or vitamin B12 -seizures -an unusual or allergic reaction to darbepoetin, erythropoietin, albumin, hamster proteins, latex, other medicines, foods, dyes, or preservatives -pregnant or trying to get pregnant -breast-feeding How should I use this medicine? This medicine is for injection into a vein or under the skin. It is usually given by a health care professional in a hospital or clinic setting. If you get this medicine at home, you will be taught how to prepare and give this medicine. Do not shake the solution before you withdraw a dose. Use exactly as directed. Take your medicine at regular intervals. Do not take your medicine more often than directed. It is important that you put your used needles and syringes in a special sharps container. Do not put them in a trash can. If you do not have a sharps container, call your pharmacist or healthcare provider to get one. Talk to your pediatrician regarding the use of this medicine in children. While this medicine may be used in children as young as 1 year for selected conditions, precautions do apply. Overdosage: If you think you have taken too much of this medicine contact a poison control center or emergency room at once. NOTE:  This medicine is only for you. Do not share this medicine with others. What if I miss a dose? If you miss a dose, take it as soon as you can. If it is almost time for your next dose, take only that dose. Do not take double or extra doses. What may interact with this medicine? Do not take this medicine with any of the following medications: -epoetin alfa This list may not describe all possible interactions. Give your health care provider a list of all the medicines, herbs, non-prescription drugs, or dietary supplements you use. Also tell them if you smoke, drink alcohol, or use illegal drugs. Some items may interact with your medicine. What should I watch for while using this medicine? Visit your prescriber or health care professional for regular checks on your progress and for the needed blood tests and blood pressure measurements. It is especially important for the doctor to make sure your hemoglobin level is in the desired range, to limit the risk of potential side effects and to give you the best benefit. Keep all appointments for any recommended tests. Check your blood pressure as directed. Ask your doctor what your blood pressure should be and when you should contact him or her. As your body makes more red blood cells, you may need to take iron, folic acid, or vitamin B supplements. Ask your doctor or health care provider which products are right for you. If you have kidney disease continue dietary restrictions, even though this medication can make you feel better. Talk with your doctor or health care professional about the   foods you eat and the vitamins that you take. What side effects may I notice from receiving this medicine? Side effects that you should report to your doctor or health care professional as soon as possible: -allergic reactions like skin rash, itching or hives, swelling of the face, lips, or tongue -breathing problems -changes in vision -chest pain -confusion, trouble speaking  or understanding -feeling faint or lightheaded, falls -high blood pressure -muscle aches or pains -pain, swelling, warmth in the leg -rapid weight gain -severe headaches -sudden numbness or weakness of the face, arm or leg -trouble walking, dizziness, loss of balance or coordination -seizures (convulsions) -swelling of the ankles, feet, hands -unusually weak or tired Side effects that usually do not require medical attention (report to your doctor or health care professional if they continue or are bothersome): -diarrhea -fever, chills (flu-like symptoms) -headaches -nausea, vomiting -redness, stinging, or swelling at site where injected This list may not describe all possible side effects. Call your doctor for medical advice about side effects. You may report side effects to FDA at 1-800-FDA-1088. Where should I keep my medicine? Keep out of the reach of children. Store in a refrigerator between 2 and 8 degrees C (36 and 46 degrees F). Do not freeze. Do not shake. Throw away any unused portion if using a single-dose vial. Throw away any unused medicine after the expiration date. NOTE: This sheet is a summary. It may not cover all possible information. If you have questions about this medicine, talk to your doctor, pharmacist, or health care provider.    2016, Elsevier/[employer]. (2008-05-22 10:23:57)

## 2016-05-15 ENCOUNTER — Ambulatory Visit: Payer: Medicare Other | Admitting: Nurse Practitioner

## 2016-05-15 ENCOUNTER — Ambulatory Visit: Payer: Medicare Other

## 2016-05-15 ENCOUNTER — Other Ambulatory Visit: Payer: Medicare Other

## 2016-05-21 ENCOUNTER — Ambulatory Visit (HOSPITAL_BASED_OUTPATIENT_CLINIC_OR_DEPARTMENT_OTHER): Payer: Medicare Other

## 2016-05-21 ENCOUNTER — Telehealth: Payer: Self-pay | Admitting: Oncology

## 2016-05-21 ENCOUNTER — Ambulatory Visit (HOSPITAL_BASED_OUTPATIENT_CLINIC_OR_DEPARTMENT_OTHER): Payer: Medicare Other | Admitting: Nurse Practitioner

## 2016-05-21 ENCOUNTER — Other Ambulatory Visit (HOSPITAL_BASED_OUTPATIENT_CLINIC_OR_DEPARTMENT_OTHER): Payer: Medicare Other

## 2016-05-21 VITALS — BP 155/65 | HR 60 | Temp 97.5°F | Resp 18

## 2016-05-21 VITALS — BP 155/65 | HR 60 | Temp 97.5°F | Resp 18 | Ht 58.5 in | Wt 248.1 lb

## 2016-05-21 DIAGNOSIS — D631 Anemia in chronic kidney disease: Secondary | ICD-10-CM

## 2016-05-21 DIAGNOSIS — N183 Chronic kidney disease, stage 3 unspecified: Secondary | ICD-10-CM

## 2016-05-21 DIAGNOSIS — M3500 Sicca syndrome, unspecified: Secondary | ICD-10-CM

## 2016-05-21 DIAGNOSIS — E119 Type 2 diabetes mellitus without complications: Secondary | ICD-10-CM

## 2016-05-21 DIAGNOSIS — M1990 Unspecified osteoarthritis, unspecified site: Secondary | ICD-10-CM

## 2016-05-21 DIAGNOSIS — I4891 Unspecified atrial fibrillation: Secondary | ICD-10-CM | POA: Diagnosis not present

## 2016-05-21 DIAGNOSIS — N189 Chronic kidney disease, unspecified: Principal | ICD-10-CM

## 2016-05-21 DIAGNOSIS — D649 Anemia, unspecified: Secondary | ICD-10-CM

## 2016-05-21 LAB — CBC WITH DIFFERENTIAL/PLATELET
BASO%: 0.1 % (ref 0.0–2.0)
Basophils Absolute: 0 10*3/uL (ref 0.0–0.1)
EOS%: 0.4 % (ref 0.0–7.0)
Eosinophils Absolute: 0 10*3/uL (ref 0.0–0.5)
HCT: 26.6 % — ABNORMAL LOW (ref 34.8–46.6)
HGB: 8.3 g/dL — ABNORMAL LOW (ref 11.6–15.9)
LYMPH%: 11.9 % — ABNORMAL LOW (ref 14.0–49.7)
MCH: 28.8 pg (ref 25.1–34.0)
MCHC: 31.2 g/dL — ABNORMAL LOW (ref 31.5–36.0)
MCV: 92.4 fL (ref 79.5–101.0)
MONO#: 0.5 10*3/uL (ref 0.1–0.9)
MONO%: 5.8 % (ref 0.0–14.0)
NEUT#: 6.7 10*3/uL — ABNORMAL HIGH (ref 1.5–6.5)
NEUT%: 81.8 % — ABNORMAL HIGH (ref 38.4–76.8)
Platelets: 267 10*3/uL (ref 145–400)
RBC: 2.88 10*6/uL — ABNORMAL LOW (ref 3.70–5.45)
RDW: 13.6 % (ref 11.2–14.5)
WBC: 8.2 10*3/uL (ref 3.9–10.3)
lymph#: 1 10*3/uL (ref 0.9–3.3)
nRBC: 0 % (ref 0–0)

## 2016-05-21 MED ORDER — DARBEPOETIN ALFA 100 MCG/0.5ML IJ SOSY
100.0000 ug | PREFILLED_SYRINGE | Freq: Once | INTRAMUSCULAR | Status: AC
  Administered 2016-05-21: 100 ug via SUBCUTANEOUS
  Filled 2016-05-21: qty 0.5

## 2016-05-21 NOTE — Progress Notes (Signed)
  [place] OFFICE PROGRESS NOTE   Diagnosis:  Anemia, renal insufficiency  INTERVAL HISTORY:   Tricia Gonzales returns as scheduled. She continues Aranesp every 3 weeks, last injection 04/30/2016. She continues ferrous sulfate daily. She periodically has loose stools. She notes mild increased dyspnea on exertion over the past month. No chest pain. No bleeding.  Objective:  Vital signs in last 24 hours:  Blood pressure (!) 155/65, pulse 60, temperature 97.5 F (36.4 C), temperature source Oral, resp. rate 18, height 4' 10.5" (1.486 m), weight 248 lb 1.6 oz (112.5 kg), SpO2 99 %.     Resp: Rales at both lung bases. No respiratory distress. Cardio: Regular rate and rhythm. GI: No organomegaly. Vascular: Trace edema at the lower legs bilaterally.   Lab Results:  Lab Results  Component Value Date   WBC 8.2 05/21/2016   HGB 8.3 (L) 05/21/2016   HCT 26.6 (L) 05/21/2016   MCV 92.4 05/21/2016   PLT 267 05/21/2016   NEUTROABS 6.7 (H) 05/21/2016    Imaging:  No results found.  Medications: I have reviewed the patient's current medications.  Assessment/Plan: 1. 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 Chronic renal insufficiency  3. Atrial fibrillation-maintained on apixaban  4. Osteoarthritis  5. Diabetes  6. Sjogren's disease  7. Pericarditis February 2017    Disposition: Tricia Gonzales appears unchanged. Hemoglobin is slightly lower today. Plan to continue Aranesp on the three-week schedule, check iron studies with next injection. We will see her in follow-up in 9 weeks. She will contact the office in the interim with any problems. We specifically discussed signs/symptoms suggestive of progressive anemia.  Of note, we discussed a blood transfusion. She does not feel she needs a transfusion at present.    Ned Card ANP/GNP-BC   05/21/2016  1:32 PM

## 2016-05-21 NOTE — Telephone Encounter (Signed)
Appointments scheduled per 05/21/16 los. A copy of the AVS report and appointment schedule was given to patient, per 05/21/16 los.

## 2016-05-21 NOTE — Patient Instructions (Signed)
Darbepoetin Alfa injection What is this medicine? DARBEPOETIN ALFA (dar be POE e tin AL fa) helps your body make more red blood cells. It is used to treat anemia caused by chronic kidney failure and chemotherapy. This medicine may be used for other purposes; ask your health care provider or pharmacist if you have questions. What should I tell my health care provider before I take this medicine? They need to know if you have any of these conditions: -blood clotting disorders or history of blood clots -cancer patient not on chemotherapy -cystic fibrosis -heart disease, such as angina, heart failure, or a history of a heart attack -hemoglobin level of 12 g/dL or greater -high blood pressure -low levels of folate, iron, or vitamin B12 -seizures -an unusual or allergic reaction to darbepoetin, erythropoietin, albumin, hamster proteins, latex, other medicines, foods, dyes, or preservatives -pregnant or trying to get pregnant -breast-feeding How should I use this medicine? This medicine is for injection into a vein or under the skin. It is usually given by a health care professional in a hospital or clinic setting. If you get this medicine at home, you will be taught how to prepare and give this medicine. Do not shake the solution before you withdraw a dose. Use exactly as directed. Take your medicine at regular intervals. Do not take your medicine more often than directed. It is important that you put your used needles and syringes in a special sharps container. Do not put them in a trash can. If you do not have a sharps container, call your pharmacist or healthcare provider to get one. Talk to your pediatrician regarding the use of this medicine in children. While this medicine may be used in children as young as 1 year for selected conditions, precautions do apply. Overdosage: If you think you have taken too much of this medicine contact a poison control center or emergency room at once. NOTE:  This medicine is only for you. Do not share this medicine with others. What if I miss a dose? If you miss a dose, take it as soon as you can. If it is almost time for your next dose, take only that dose. Do not take double or extra doses. What may interact with this medicine? Do not take this medicine with any of the following medications: -epoetin alfa This list may not describe all possible interactions. Give your health care provider a list of all the medicines, herbs, non-prescription drugs, or dietary supplements you use. Also tell them if you smoke, drink alcohol, or use illegal drugs. Some items may interact with your medicine. What should I watch for while using this medicine? Visit your prescriber or health care professional for regular checks on your progress and for the needed blood tests and blood pressure measurements. It is especially important for the doctor to make sure your hemoglobin level is in the desired range, to limit the risk of potential side effects and to give you the best benefit. Keep all appointments for any recommended tests. Check your blood pressure as directed. Ask your doctor what your blood pressure should be and when you should contact him or her. As your body makes more red blood cells, you may need to take iron, folic acid, or vitamin B supplements. Ask your doctor or health care provider which products are right for you. If you have kidney disease continue dietary restrictions, even though this medication can make you feel better. Talk with your doctor or health care professional about the   foods you eat and the vitamins that you take. What side effects may I notice from receiving this medicine? Side effects that you should report to your doctor or health care professional as soon as possible: -allergic reactions like skin rash, itching or hives, swelling of the face, lips, or tongue -breathing problems -changes in vision -chest pain -confusion, trouble speaking  or understanding -feeling faint or lightheaded, falls -high blood pressure -muscle aches or pains -pain, swelling, warmth in the leg -rapid weight gain -severe headaches -sudden numbness or weakness of the face, arm or leg -trouble walking, dizziness, loss of balance or coordination -seizures (convulsions) -swelling of the ankles, feet, hands -unusually weak or tired Side effects that usually do not require medical attention (report to your doctor or health care professional if they continue or are bothersome): -diarrhea -fever, chills (flu-like symptoms) -headaches -nausea, vomiting -redness, stinging, or swelling at site where injected This list may not describe all possible side effects. Call your doctor for medical advice about side effects. You may report side effects to FDA at 1-800-FDA-1088. Where should I keep my medicine? Keep out of the reach of children. Store in a refrigerator between 2 and 8 degrees C (36 and 46 degrees F). Do not freeze. Do not shake. Throw away any unused portion if using a single-dose vial. Throw away any unused medicine after the expiration date. NOTE: This sheet is a summary. It may not cover all possible information. If you have questions about this medicine, talk to your doctor, pharmacist, or health care provider.    2016, Elsevier/[employer]. (2008-05-22 10:23:57)

## 2016-05-27 ENCOUNTER — Telehealth: Payer: Self-pay | Admitting: Internal Medicine

## 2016-05-27 NOTE — Telephone Encounter (Signed)
Pt's benefits have been verified pt has $0 copay for prolia, has an appt on 12/13 at 3:15 PM

## 2016-05-28 ENCOUNTER — Other Ambulatory Visit: Payer: Self-pay | Admitting: Physician Assistant

## 2016-06-03 ENCOUNTER — Ambulatory Visit (INDEPENDENT_AMBULATORY_CARE_PROVIDER_SITE_OTHER): Payer: Medicare Other

## 2016-06-03 DIAGNOSIS — M810 Age-related osteoporosis without current pathological fracture: Secondary | ICD-10-CM

## 2016-06-03 MED ORDER — DENOSUMAB 60 MG/ML ~~LOC~~ SOLN
60.0000 mg | Freq: Once | SUBCUTANEOUS | Status: AC
  Administered 2016-06-03: 60 mg via SUBCUTANEOUS

## 2016-06-03 NOTE — Progress Notes (Signed)
Patient here for Prolia Injection. Gave Prolia 25m Sub-Q in L back of arm. Patient tolerated well. Band-Aid applied.

## 2016-06-04 ENCOUNTER — Encounter: Payer: Self-pay | Admitting: Internal Medicine

## 2016-06-11 ENCOUNTER — Ambulatory Visit (HOSPITAL_BASED_OUTPATIENT_CLINIC_OR_DEPARTMENT_OTHER): Payer: Medicare Other

## 2016-06-11 ENCOUNTER — Other Ambulatory Visit: Payer: Self-pay

## 2016-06-11 ENCOUNTER — Other Ambulatory Visit (HOSPITAL_BASED_OUTPATIENT_CLINIC_OR_DEPARTMENT_OTHER): Payer: Medicare Other

## 2016-06-11 VITALS — BP 157/56 | HR 62 | Temp 97.7°F | Resp 20

## 2016-06-11 DIAGNOSIS — D631 Anemia in chronic kidney disease: Secondary | ICD-10-CM

## 2016-06-11 DIAGNOSIS — N183 Chronic kidney disease, stage 3 unspecified: Secondary | ICD-10-CM

## 2016-06-11 DIAGNOSIS — N189 Chronic kidney disease, unspecified: Secondary | ICD-10-CM

## 2016-06-11 LAB — CBC WITH DIFFERENTIAL/PLATELET
BASO%: 0.3 % (ref 0.0–2.0)
Basophils Absolute: 0 10*3/uL (ref 0.0–0.1)
EOS%: 0.6 % (ref 0.0–7.0)
Eosinophils Absolute: 0.1 10*3/uL (ref 0.0–0.5)
HCT: 26 % — ABNORMAL LOW (ref 34.8–46.6)
HGB: 8.2 g/dL — ABNORMAL LOW (ref 11.6–15.9)
LYMPH%: 9.9 % — ABNORMAL LOW (ref 14.0–49.7)
MCH: 29.1 pg (ref 25.1–34.0)
MCHC: 31.6 g/dL (ref 31.5–36.0)
MCV: 92 fL (ref 79.5–101.0)
MONO#: 0.5 10*3/uL (ref 0.1–0.9)
MONO%: 5.3 % (ref 0.0–14.0)
NEUT#: 7.4 10*3/uL — ABNORMAL HIGH (ref 1.5–6.5)
NEUT%: 83.9 % — ABNORMAL HIGH (ref 38.4–76.8)
Platelets: 247 10*3/uL (ref 145–400)
RBC: 2.83 10*6/uL — ABNORMAL LOW (ref 3.70–5.45)
RDW: 13.8 % (ref 11.2–14.5)
WBC: 8.9 10*3/uL (ref 3.9–10.3)
lymph#: 0.9 10*3/uL (ref 0.9–3.3)

## 2016-06-11 LAB — IRON AND TIBC
%SAT: 11 % — ABNORMAL LOW (ref 21–57)
Iron: 40 ug/dL — ABNORMAL LOW (ref 41–142)
TIBC: 358 ug/dL (ref 236–444)
UIBC: 317 ug/dL (ref 120–384)

## 2016-06-11 LAB — FERRITIN: Ferritin: 25 ng/ml (ref 9–269)

## 2016-06-11 MED ORDER — DARBEPOETIN ALFA 100 MCG/0.5ML IJ SOSY
100.0000 ug | PREFILLED_SYRINGE | Freq: Once | INTRAMUSCULAR | Status: AC
  Administered 2016-06-11: 100 ug via SUBCUTANEOUS
  Filled 2016-06-11: qty 0.5

## 2016-06-11 NOTE — Patient Instructions (Signed)
Darbepoetin Alfa injection What is this medicine? DARBEPOETIN ALFA (dar be POE e tin AL fa) helps your body make more red blood cells. It is used to treat anemia caused by chronic kidney failure and chemotherapy. This medicine may be used for other purposes; ask your health care provider or pharmacist if you have questions. What should I tell my health care provider before I take this medicine? They need to know if you have any of these conditions: -blood clotting disorders or history of blood clots -cancer patient not on chemotherapy -cystic fibrosis -heart disease, such as angina, heart failure, or a history of a heart attack -hemoglobin level of 12 g/dL or greater -high blood pressure -low levels of folate, iron, or vitamin B12 -seizures -an unusual or allergic reaction to darbepoetin, erythropoietin, albumin, hamster proteins, latex, other medicines, foods, dyes, or preservatives -pregnant or trying to get pregnant -breast-feeding How should I use this medicine? This medicine is for injection into a vein or under the skin. It is usually given by a health care professional in a hospital or clinic setting. If you get this medicine at home, you will be taught how to prepare and give this medicine. Do not shake the solution before you withdraw a dose. Use exactly as directed. Take your medicine at regular intervals. Do not take your medicine more often than directed. It is important that you put your used needles and syringes in a special sharps container. Do not put them in a trash can. If you do not have a sharps container, call your pharmacist or healthcare provider to get one. Talk to your pediatrician regarding the use of this medicine in children. While this medicine may be used in children as young as 1 year for selected conditions, precautions do apply. Overdosage: If you think you have taken too much of this medicine contact a poison control center or emergency room at once. NOTE:  This medicine is only for you. Do not share this medicine with others. What if I miss a dose? If you miss a dose, take it as soon as you can. If it is almost time for your next dose, take only that dose. Do not take double or extra doses. What may interact with this medicine? Do not take this medicine with any of the following medications: -epoetin alfa This list may not describe all possible interactions. Give your health care provider a list of all the medicines, herbs, non-prescription drugs, or dietary supplements you use. Also tell them if you smoke, drink alcohol, or use illegal drugs. Some items may interact with your medicine. What should I watch for while using this medicine? Visit your prescriber or health care professional for regular checks on your progress and for the needed blood tests and blood pressure measurements. It is especially important for the doctor to make sure your hemoglobin level is in the desired range, to limit the risk of potential side effects and to give you the best benefit. Keep all appointments for any recommended tests. Check your blood pressure as directed. Ask your doctor what your blood pressure should be and when you should contact him or her. As your body makes more red blood cells, you may need to take iron, folic acid, or vitamin B supplements. Ask your doctor or health care provider which products are right for you. If you have kidney disease continue dietary restrictions, even though this medication can make you feel better. Talk with your doctor or health care professional about the   foods you eat and the vitamins that you take. What side effects may I notice from receiving this medicine? Side effects that you should report to your doctor or health care professional as soon as possible: -allergic reactions like skin rash, itching or hives, swelling of the face, lips, or tongue -breathing problems -changes in vision -chest pain -confusion, trouble speaking  or understanding -feeling faint or lightheaded, falls -high blood pressure -muscle aches or pains -pain, swelling, warmth in the leg -rapid weight gain -severe headaches -sudden numbness or weakness of the face, arm or leg -trouble walking, dizziness, loss of balance or coordination -seizures (convulsions) -swelling of the ankles, feet, hands -unusually weak or tired Side effects that usually do not require medical attention (report to your doctor or health care professional if they continue or are bothersome): -diarrhea -fever, chills (flu-like symptoms) -headaches -nausea, vomiting -redness, stinging, or swelling at site where injected This list may not describe all possible side effects. Call your doctor for medical advice about side effects. You may report side effects to FDA at 1-800-FDA-1088. Where should I keep my medicine? Keep out of the reach of children. Store in a refrigerator between 2 and 8 degrees C (36 and 46 degrees F). Do not freeze. Do not shake. Throw away any unused portion if using a single-dose vial. Throw away any unused medicine after the expiration date. NOTE: This sheet is a summary. It may not cover all possible information. If you have questions about this medicine, talk to your doctor, pharmacist, or health care provider.    2016, Elsevier/[employer]. (2008-05-22 10:23:57)

## 2016-06-12 ENCOUNTER — Telehealth: Payer: Self-pay | Admitting: Oncology

## 2016-06-12 ENCOUNTER — Telehealth: Payer: Self-pay | Admitting: *Deleted

## 2016-06-12 DIAGNOSIS — Z862 Personal history of diseases of the blood and blood-forming organs and certain disorders involving the immune mechanism: Secondary | ICD-10-CM

## 2016-06-12 NOTE — Telephone Encounter (Signed)
Call placed to patient to inform her per L. Thomas NP that iron stores are low and to increase oral iron to 3 times a day and if she is unable to increase dose we will add feraheme weekly for 2 doses and that aranesp injection will be changed to every 2 weeks with next lab/injection due on 06/25/16.  Patient states that she is unable to increase oral iron to 3 times a day d/t difficulties with diarrhea d/t medication and requests that IV Feraheme be scheduled the first week of January.  Patient verbalizes an understanding of next lab/injection on 06/25/16.  Message sent to scheduling to call pt with time for 06/25/16.  Patient appreciative of call and has no questions at this time.

## 2016-06-12 NOTE — Telephone Encounter (Signed)
Spoke with patient re January/February appointments.

## 2016-06-12 NOTE — Telephone Encounter (Signed)
-----  Message from Owens Shark, NP sent at 06/12/2016  9:32 AM EST ----- Please let her know iron stores are low -- see if she can increase oral iron to 3 times a day (if not will need feraheme weekly for 2 doses); also let her know we are changing aranesp to every 2 weeks (schedule will need to be adjusted, next lab/injection should be 06/25/16, then every 2 weeks thereafter.   thanks

## 2016-06-18 ENCOUNTER — Encounter (INDEPENDENT_AMBULATORY_CARE_PROVIDER_SITE_OTHER): Payer: Self-pay

## 2016-06-18 ENCOUNTER — Ambulatory Visit (INDEPENDENT_AMBULATORY_CARE_PROVIDER_SITE_OTHER): Payer: Medicare Other | Admitting: Internal Medicine

## 2016-06-18 ENCOUNTER — Encounter: Payer: Self-pay | Admitting: Internal Medicine

## 2016-06-18 DIAGNOSIS — R001 Bradycardia, unspecified: Secondary | ICD-10-CM

## 2016-06-18 LAB — CUP PACEART INCLINIC DEVICE CHECK
Battery Remaining Longevity: 99 mo
Battery Voltage: 3.02 V
Brady Statistic AP VP Percent: 54.65 %
Brady Statistic AP VS Percent: 3.67 %
Brady Statistic AS VP Percent: 2.31 %
Brady Statistic AS VS Percent: 39.37 %
Brady Statistic RA Percent Paced: 58.27 %
Brady Statistic RV Percent Paced: 57.07 %
Date Time Interrogation Session: 20171228143755
Implantable Lead Implant Date: 20161227
Implantable Lead Implant Date: 20161227
Implantable Lead Location: 753859
Implantable Lead Location: 753860
Implantable Lead Model: 5076
Implantable Lead Model: 5076
Implantable Pulse Generator Implant Date: 20161227
Lead Channel Impedance Value: 323 Ohm
Lead Channel Impedance Value: 380 Ohm
Lead Channel Impedance Value: 418 Ohm
Lead Channel Impedance Value: 437 Ohm
Lead Channel Pacing Threshold Amplitude: 0.75 V
Lead Channel Pacing Threshold Amplitude: 0.75 V
Lead Channel Pacing Threshold Pulse Width: 0.4 ms
Lead Channel Pacing Threshold Pulse Width: 0.4 ms
Lead Channel Sensing Intrinsic Amplitude: 10 mV
Lead Channel Sensing Intrinsic Amplitude: 11.5 mV
Lead Channel Sensing Intrinsic Amplitude: 2.125 mV
Lead Channel Sensing Intrinsic Amplitude: 3.375 mV
Lead Channel Setting Pacing Amplitude: 2 V
Lead Channel Setting Pacing Amplitude: 2.5 V
Lead Channel Setting Pacing Pulse Width: 0.4 ms
Lead Channel Setting Sensing Sensitivity: 2.8 mV

## 2016-06-18 NOTE — Progress Notes (Signed)
HPI Mrs. Laurich returns today for followup. 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 In the interim, she has had some PAF. She also notes back and chest pain. She was thought to have pericarditis. This has resolved. She feels her heart fluttering at times.   Allergies  Allergen Reactions  . Norvasc [Amlodipine Besylate] Shortness Of Breath  . Glucophage [Metformin Hydrochloride] Other (See Comments)    "increases creatinine"  . Lisinopril     Headache   . Losartan     Headache   . Morphine And Related Other (See Comments)    "hallucinations"  . Simvastatin Other (See Comments)    Body aches  . Trulicity [Dulaglutide] Other (See Comments)    Severe mood swings     Current Outpatient Prescriptions  Medication Sig Dispense Refill  . ACCU-CHEK AVIVA PLUS test strip 1 each by Other route daily.     Marland Kitchen acetaminophen (TYLENOL) 650 MG CR tablet Take 1,300 mg by mouth at bedtime as needed for pain.    Marland Kitchen apixaban (ELIQUIS) 2.5 MG TABS tablet Take 1 tablet (2.5 mg total) by mouth 2 (two) times daily. 180 tablet 3  . cholecalciferol (VITAMIN D) 1000 UNITS tablet Take 2,000 Units by mouth at bedtime. Reported on 11/15/2015    . denosumab (PROLIA) 60 MG/ML SOLN injection Inject 60 mg into the skin every 6 (six) months. Administer in upper arm, thigh, or abdomen    . doxazosin (CARDURA) 4 MG tablet Take 4 mg by mouth daily.    Marland Kitchen Epoetin Alfa (PROCRIT IJ) Inject as directed.    . ferrous sulfate 325 (65 FE) MG EC tablet Take 1 tablet (325 mg total) by mouth 2 (two) times daily. (Patient taking differently: Take 325 mg by mouth daily with breakfast. Once a day) 60 tablet 3  . gabapentin (NEURONTIN) 300 MG capsule Take 300 mg by mouth 2 (two) times daily.     Marland Kitchen HYDROcodone-acetaminophen (NORCO/VICODIN) 5-325 MG tablet Take 1 tablet by mouth every 6 (six) hours as needed for moderate pain.    Marland Kitchen irbesartan  (AVAPRO) 300 MG tablet Take 300 mg by mouth daily.    Marland Kitchen JANUVIA 50 MG tablet     . metoprolol succinate (TOPROL-XL) 25 MG 24 hr tablet Take 1.5 tablets (37.5 mg total) by mouth daily. 90 tablet 1  . montelukast (SINGULAIR) 10 MG tablet Take 10 mg by mouth at bedtime.     . Multiple Vitamin (MULITIVITAMIN WITH MINERALS) TABS Take 1 tablet by mouth daily.    . pantoprazole (PROTONIX) 40 MG tablet Take 1 tablet (40 mg total) by mouth daily. 30 tablet 1  . pentoxifylline (TRENTAL) 400 MG CR tablet Take 400 mg by mouth daily.    . polyethylene glycol powder (GLYCOLAX/MIRALAX) powder miz 17 grams in 8 oz of water up to two times daily 255 g 3  . pravastatin (PRAVACHOL) 40 MG tablet Take 40 mg by mouth daily.     Nelva Nay SOLOSTAR 300 UNIT/ML SOPN Inject 20 Units into the skin daily.    Marland Kitchen ULORIC 40 MG tablet Take 40 mg by mouth daily.      No current facility-administered medications for this visit.      Past Medical History:  Diagnosis Date  . Anemia   . 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 Anxiety   . Arthritis   .  Atrial fibrillation ([place])   . Chronic back pain   . Chronic kidney disease (CKD), stage III (moderate)   . Depression   . Diverticulosis   . Esophageal stricture   . Gastritis   . Hiatal hernia   . History of blood transfusion    "@ least w/1st knee OR"  . History of gout   . Hyperlipidemia   . Hypertension   . Intestinal obstruction   . Kidney stones   . Obesity   . Osteoarthritis   . Pericarditis   . Presence of permanent cardiac pacemaker   . Sjogren's disease ([place])   . Type II diabetes mellitus ([place])   . Vitamin B12 deficiency     ROS:   All systems reviewed and negative except as noted in the HPI.   Past Surgical History:  Procedure Laterality Date  . ABDOMINAL HYSTERECTOMY    . CHOLECYSTECTOMY OPEN    . COLECTOMY     for rectovaginal fistula  . COLONOSCOPY  01/07/2012   Procedure: COLONOSCOPY;  Surgeon: Lafayette Dragon, MD;  Location: [facility]  ENDOSCOPY;  Service: Endoscopy;  Laterality: N/A;  . EP IMPLANTABLE DEVICE N/A 06/18/2015   Procedure: Pacemaker Implant;  Surgeon: Evans Lance, MD;  Location: [facility];  Service: Cardiovascular;  Laterality: N/A;  . ESOPHAGOGASTRODUODENOSCOPY (EGD) WITH ESOPHAGEAL DILATION    . ESOPHAGOGASTRODUODENOSCOPY (EGD) WITH PROPOFOL N/A 08/12/2015   Procedure: ESOPHAGOGASTRODUODENOSCOPY (EGD) WITH PROPOFOL;  Surgeon: Manus Gunning, MD;  Location: [facility];  Service: Gastroenterology;  Laterality: N/A;  . INSERT / REPLACE / REMOVE PACEMAKER    . JOINT REPLACEMENT    . KNEE ARTHROSCOPY Right   . REVISION TOTAL KNEE ARTHROPLASTY Left   . TOTAL KNEE ARTHROPLASTY Bilateral      Family History  Problem Relation Age of Onset  . Diabetes Mother   . Diabetes Brother   . Hypertension Sister   . Colon cancer Neg Hx   . Heart attack Neg Hx   . Stroke Neg Hx      Social History   Social History  . Marital status: Divorced    Spouse name: N/A  . Number of children: 2  . Years of education: N/A   Occupational History  . Not on file.   Social History Main Topics  . Smoking status: Former Smoker    Packs/day: 2.00    Years: 3.00    Types: Cigarettes    Quit date: 06/23/1983  . Smokeless tobacco: Never Used  . Alcohol use 1.2 oz/week    2 Glasses of wine per week     Comment: occ  . Drug use: No  . Sexual activity: No   Other Topics Concern  . Not on file   Social History Narrative  . No narrative on file     BP (!) 144/70   Pulse 84   Ht _0  (1.473 m)   Wt 249 lb (112.9 kg)   SpO2 96%   BMI 52.04 kg/m   Physical Exam:  Well appearing 75 yo woman, morbidly obese, NAD HEENT: Unremarkable Neck:  7 cm JVD, no thyromegally Lymphatics:  No adenopathy Back:  No CVA tenderness Lungs:  Clear though breath sounds are decreased. HEART:  Regular rate rhythm, no murmurs, no rubs, no clicks Abd:  soft, obese, positive bowel sounds, no organomegally, no  rebound, no guarding Ext:  2 plus pulses, no edema, no cyanosis, no clubbing Skin:  No rashes no nodules Neuro:  CN II  through XII intact, motor grossly intact  DEVICE  Normal device function.  See PaceArt for details.   Assess/Plan: 1. PAF - she has both fib and flutter. She is maintaining NSR. She will continue her current meds.  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 PPM - her medtronic DDD PM is working normally. Will recheck in several months. 4. Chronic diastolic heart failure - she is euvolemic today.  I have encouraged the patient to lose weight and eat less and reduce her salt intake. No change in cardiac meds.  Mikle Bosworth.D.

## 2016-06-18 NOTE — Patient Instructions (Addendum)
Medication Instructions:  Your physician has recommended you make the following change in your medication:  1) STOP baby Aspirin 81 mg    Labwork: None Ordered   Testing/Procedures: None Ordered    Follow-Up: Your physician wants you to follow-up in: 1 year with Dr. Lovena Le. You will receive a reminder letter in the mail two months in advance. If you don't receive a letter, please call our office to schedule the follow-up appointment.   Remote monitoring is used to monitor your Pacemaker or ICD from home. This monitoring reduces the number of office visits required to check your device to one time per year. It allows [place] to keep an eye on the functioning of your device to ensure it is working properly. You are scheduled for a device check from home on 09/17/16. You may send your transmission at any time that day. If you have a wireless device, the transmission will be sent automatically. After your physician reviews your transmission, you will receive a postcard with your next transmission date.    Any Other Special Instructions Will Be Listed Below (If Applicable).     If you need a refill on your cardiac medications before your next appointment, please call your pharmacy.

## 2016-06-25 ENCOUNTER — Other Ambulatory Visit: Payer: Self-pay | Admitting: Oncology

## 2016-06-25 ENCOUNTER — Ambulatory Visit: Payer: Medicare Other

## 2016-06-25 ENCOUNTER — Ambulatory Visit (HOSPITAL_BASED_OUTPATIENT_CLINIC_OR_DEPARTMENT_OTHER): Payer: Medicare Other

## 2016-06-25 ENCOUNTER — Other Ambulatory Visit (HOSPITAL_BASED_OUTPATIENT_CLINIC_OR_DEPARTMENT_OTHER): Payer: Medicare Other

## 2016-06-25 VITALS — BP 151/48 | HR 66 | Temp 97.7°F | Resp 18

## 2016-06-25 DIAGNOSIS — D631 Anemia in chronic kidney disease: Secondary | ICD-10-CM | POA: Diagnosis not present

## 2016-06-25 DIAGNOSIS — N189 Chronic kidney disease, unspecified: Secondary | ICD-10-CM | POA: Diagnosis not present

## 2016-06-25 DIAGNOSIS — Z862 Personal history of diseases of the blood and blood-forming organs and certain disorders involving the immune mechanism: Secondary | ICD-10-CM

## 2016-06-25 DIAGNOSIS — N183 Chronic kidney disease, stage 3 unspecified: Secondary | ICD-10-CM

## 2016-06-25 LAB — CBC WITH DIFFERENTIAL/PLATELET
BASO%: 0.1 % (ref 0.0–2.0)
Basophils Absolute: 0 10*3/uL (ref 0.0–0.1)
EOS%: 0.9 % (ref 0.0–7.0)
Eosinophils Absolute: 0.1 10*3/uL (ref 0.0–0.5)
HCT: 29.1 % — ABNORMAL LOW (ref 34.8–46.6)
HGB: 8.8 g/dL — ABNORMAL LOW (ref 11.6–15.9)
LYMPH%: 8.9 % — ABNORMAL LOW (ref 14.0–49.7)
MCH: 28.8 pg (ref 25.1–34.0)
MCHC: 30.2 g/dL — ABNORMAL LOW (ref 31.5–36.0)
MCV: 95.1 fL (ref 79.5–101.0)
MONO#: 0.6 10*3/uL (ref 0.1–0.9)
MONO%: 6 % (ref 0.0–14.0)
NEUT#: 7.7 10*3/uL — ABNORMAL HIGH (ref 1.5–6.5)
NEUT%: 84.1 % — ABNORMAL HIGH (ref 38.4–76.8)
Platelets: 272 10*3/uL (ref 145–400)
RBC: 3.06 10*6/uL — ABNORMAL LOW (ref 3.70–5.45)
RDW: 13.8 % (ref 11.2–14.5)
WBC: 9.2 10*3/uL (ref 3.9–10.3)
lymph#: 0.8 10*3/uL — ABNORMAL LOW (ref 0.9–3.3)
nRBC: 0 % (ref 0–0)

## 2016-06-25 MED ORDER — SODIUM CHLORIDE 0.9 % IV SOLN
Freq: Once | INTRAVENOUS | Status: AC
  Administered 2016-06-25: 14:00:00 via INTRAVENOUS

## 2016-06-25 MED ORDER — DARBEPOETIN ALFA 100 MCG/0.5ML IJ SOSY: 100.0000 ug | PREFILLED_SYRINGE | Freq: Once | INTRAMUSCULAR | Status: DC

## 2016-06-25 MED ORDER — SODIUM CHLORIDE 0.9 % IV SOLN
510.0000 mg | Freq: Once | INTRAVENOUS | Status: AC
  Administered 2016-06-25: 510 mg via INTRAVENOUS
  Filled 2016-06-25: qty 17

## 2016-06-25 NOTE — Progress Notes (Signed)
Per pharmacy patient not to receive aranesp today. Dr Benay Spice made aware and appointment rescheduled. Patient aware and verbalized understanding. New schedule given to patient.  Patient tolerated infusion well. Patient and vital signs stable upon discharge.

## 2016-06-25 NOTE — Patient Instructions (Signed)
Ferumoxytol injection What is this medicine? FERUMOXYTOL is an iron complex. Iron is used to make healthy red blood cells, which carry oxygen and nutrients throughout the body. This medicine is used to treat iron deficiency anemia in people with chronic kidney disease. COMMON BRAND NAME(S): Feraheme What should I tell my health care provider before I take this medicine? They need to know if you have any of these conditions: -anemia not caused by low iron levels -high levels of iron in the blood -magnetic resonance imaging (MRI) test scheduled -an unusual or allergic reaction to iron, other medicines, foods, dyes, or preservatives -pregnant or trying to get pregnant -breast-feeding How should I use this medicine? This medicine is for injection into a vein. It is given by a health care professional in a hospital or clinic setting. Talk to your pediatrician regarding the use of this medicine in children. Special care may be needed. What if I miss a dose? It is important not to miss your dose. Call your doctor or health care professional if you are unable to keep an appointment. What may interact with this medicine? This medicine may interact with the following medications: -other iron products What should I watch for while using this medicine? Visit your doctor or healthcare professional regularly. Tell your doctor or healthcare professional if your symptoms do not start to get better or if they get worse. You may need blood work done while you are taking this medicine. You may need to follow a special diet. Talk to your doctor. Foods that contain iron include: whole grains/cereals, dried fruits, beans, or peas, leafy green vegetables, and organ meats (liver, kidney). What side effects may I notice from receiving this medicine? Side effects that you should report to your doctor or health care professional as soon as possible: -allergic reactions like skin rash, itching or hives, swelling of the  face, lips, or tongue -breathing problems -changes in blood pressure -feeling faint or lightheaded, falls -fever or chills -flushing, sweating, or hot feelings -swelling of the ankles or feet Side effects that usually do not require medical attention (report to your doctor or health care professional if they continue or are bothersome): -diarrhea -headache -nausea, vomiting -stomach pain Where should I keep my medicine? This drug is given in a hospital or clinic and will not be stored at home.  2017 Elsevier/Gold Standard (2015-07-11 12:41:49)  

## 2016-06-30 ENCOUNTER — Ambulatory Visit (HOSPITAL_BASED_OUTPATIENT_CLINIC_OR_DEPARTMENT_OTHER): Payer: Medicare Other

## 2016-06-30 ENCOUNTER — Other Ambulatory Visit: Payer: Medicare Other

## 2016-06-30 ENCOUNTER — Other Ambulatory Visit (HOSPITAL_BASED_OUTPATIENT_CLINIC_OR_DEPARTMENT_OTHER): Payer: Medicare Other

## 2016-06-30 VITALS — BP 181/50 | HR 61 | Temp 97.9°F | Resp 20

## 2016-06-30 DIAGNOSIS — D631 Anemia in chronic kidney disease: Secondary | ICD-10-CM | POA: Diagnosis not present

## 2016-06-30 DIAGNOSIS — N183 Chronic kidney disease, stage 3 unspecified: Secondary | ICD-10-CM

## 2016-06-30 DIAGNOSIS — N189 Chronic kidney disease, unspecified: Secondary | ICD-10-CM | POA: Diagnosis not present

## 2016-06-30 LAB — CBC WITH DIFFERENTIAL/PLATELET
BASO%: 0.1 % (ref 0.0–2.0)
Basophils Absolute: 0 10*3/uL (ref 0.0–0.1)
EOS%: 0.4 % (ref 0.0–7.0)
Eosinophils Absolute: 0 10*3/uL (ref 0.0–0.5)
HCT: 29.3 % — ABNORMAL LOW (ref 34.8–46.6)
HGB: 8.9 g/dL — ABNORMAL LOW (ref 11.6–15.9)
LYMPH%: 12 % — ABNORMAL LOW (ref 14.0–49.7)
MCH: 29 pg (ref 25.1–34.0)
MCHC: 30.4 g/dL — ABNORMAL LOW (ref 31.5–36.0)
MCV: 95.4 fL (ref 79.5–101.0)
MONO#: 0.4 10*3/uL (ref 0.1–0.9)
MONO%: 4.6 % (ref 0.0–14.0)
NEUT#: 7.8 10*3/uL — ABNORMAL HIGH (ref 1.5–6.5)
NEUT%: 82.9 % — ABNORMAL HIGH (ref 38.4–76.8)
Platelets: 270 10*3/uL (ref 145–400)
RBC: 3.07 10*6/uL — ABNORMAL LOW (ref 3.70–5.45)
RDW: 13.7 % (ref 11.2–14.5)
WBC: 9.4 10*3/uL (ref 3.9–10.3)
lymph#: 1.1 10*3/uL (ref 0.9–3.3)

## 2016-06-30 MED ORDER — DARBEPOETIN ALFA 100 MCG/0.5ML IJ SOSY
100.0000 ug | PREFILLED_SYRINGE | Freq: Once | INTRAMUSCULAR | Status: AC
  Administered 2016-06-30: 100 ug via SUBCUTANEOUS
  Filled 2016-06-30: qty 0.5

## 2016-07-02 ENCOUNTER — Ambulatory Visit (HOSPITAL_BASED_OUTPATIENT_CLINIC_OR_DEPARTMENT_OTHER): Payer: Medicare Other

## 2016-07-02 ENCOUNTER — Telehealth: Payer: Self-pay | Admitting: *Deleted

## 2016-07-02 ENCOUNTER — Ambulatory Visit: Payer: Medicare Other

## 2016-07-02 ENCOUNTER — Other Ambulatory Visit: Payer: Medicare Other

## 2016-07-02 VITALS — BP 156/53 | HR 60 | Temp 98.0°F | Resp 18

## 2016-07-02 DIAGNOSIS — N189 Chronic kidney disease, unspecified: Secondary | ICD-10-CM | POA: Diagnosis not present

## 2016-07-02 DIAGNOSIS — D631 Anemia in chronic kidney disease: Secondary | ICD-10-CM

## 2016-07-02 DIAGNOSIS — N183 Chronic kidney disease, stage 3 unspecified: Secondary | ICD-10-CM

## 2016-07-02 MED ORDER — SODIUM CHLORIDE 0.9 % IV SOLN
Freq: Once | INTRAVENOUS | Status: AC
  Administered 2016-07-02: 14:00:00 via INTRAVENOUS

## 2016-07-02 MED ORDER — FERUMOXYTOL INJECTION 510 MG/17 ML
510.0000 mg | Freq: Once | INTRAVENOUS | Status: AC
  Administered 2016-07-02: 510 mg via INTRAVENOUS
  Filled 2016-07-02: qty 17

## 2016-07-02 NOTE — Patient Instructions (Signed)
Ferumoxytol injection What is this medicine? FERUMOXYTOL is an iron complex. Iron is used to make healthy red blood cells, which carry oxygen and nutrients throughout the body. This medicine is used to treat iron deficiency anemia in people with chronic kidney disease. COMMON BRAND NAME(S): Feraheme What should I tell my health care provider before I take this medicine? They need to know if you have any of these conditions: -anemia not caused by low iron levels -high levels of iron in the blood -magnetic resonance imaging (MRI) test scheduled -an unusual or allergic reaction to iron, other medicines, foods, dyes, or preservatives -pregnant or trying to get pregnant -breast-feeding How should I use this medicine? This medicine is for injection into a vein. It is given by a health care professional in a hospital or clinic setting. Talk to your pediatrician regarding the use of this medicine in children. Special care may be needed. What if I miss a dose? It is important not to miss your dose. Call your doctor or health care professional if you are unable to keep an appointment. What may interact with this medicine? This medicine may interact with the following medications: -other iron products What should I watch for while using this medicine? Visit your doctor or healthcare professional regularly. Tell your doctor or healthcare professional if your symptoms do not start to get better or if they get worse. You may need blood work done while you are taking this medicine. You may need to follow a special diet. Talk to your doctor. Foods that contain iron include: whole grains/cereals, dried fruits, beans, or peas, leafy green vegetables, and organ meats (liver, kidney). What side effects may I notice from receiving this medicine? Side effects that you should report to your doctor or health care professional as soon as possible: -allergic reactions like skin rash, itching or hives, swelling of the  face, lips, or tongue -breathing problems -changes in blood pressure -feeling faint or lightheaded, falls -fever or chills -flushing, sweating, or hot feelings -swelling of the ankles or feet Side effects that usually do not require medical attention (report to your doctor or health care professional if they continue or are bothersome): -diarrhea -headache -nausea, vomiting -stomach pain Where should I keep my medicine? This drug is given in a hospital or clinic and will not be stored at home.  2017 Elsevier/Gold Standard (2015-07-11 12:41:49)  

## 2016-07-02 NOTE — Telephone Encounter (Signed)
Per chemo RN we have moved appts from 1/18 to 1/23

## 2016-07-09 ENCOUNTER — Other Ambulatory Visit: Payer: Medicare Other

## 2016-07-09 ENCOUNTER — Ambulatory Visit: Payer: Medicare Other

## 2016-07-14 ENCOUNTER — Ambulatory Visit (HOSPITAL_BASED_OUTPATIENT_CLINIC_OR_DEPARTMENT_OTHER): Payer: Medicare Other

## 2016-07-14 ENCOUNTER — Other Ambulatory Visit (HOSPITAL_BASED_OUTPATIENT_CLINIC_OR_DEPARTMENT_OTHER): Payer: Medicare Other

## 2016-07-14 VITALS — BP 151/72 | HR 63 | Temp 97.8°F | Resp 18

## 2016-07-14 DIAGNOSIS — N183 Chronic kidney disease, stage 3 unspecified: Secondary | ICD-10-CM

## 2016-07-14 DIAGNOSIS — Z862 Personal history of diseases of the blood and blood-forming organs and certain disorders involving the immune mechanism: Secondary | ICD-10-CM

## 2016-07-14 DIAGNOSIS — D631 Anemia in chronic kidney disease: Secondary | ICD-10-CM | POA: Diagnosis not present

## 2016-07-14 DIAGNOSIS — N189 Chronic kidney disease, unspecified: Secondary | ICD-10-CM

## 2016-07-14 LAB — CBC WITH DIFFERENTIAL/PLATELET
BASO%: 0.2 % (ref 0.0–2.0)
Basophils Absolute: 0 10e3/uL (ref 0.0–0.1)
EOS%: 0.4 % (ref 0.0–7.0)
Eosinophils Absolute: 0 10e3/uL (ref 0.0–0.5)
HCT: 32.4 % — ABNORMAL LOW (ref 34.8–46.6)
HGB: 10.4 g/dL — ABNORMAL LOW (ref 11.6–15.9)
LYMPH%: 8.1 % — ABNORMAL LOW (ref 14.0–49.7)
MCH: 30 pg (ref 25.1–34.0)
MCHC: 32.3 g/dL (ref 31.5–36.0)
MCV: 92.9 fL (ref 79.5–101.0)
MONO#: 0.6 10e3/uL (ref 0.1–0.9)
MONO%: 6.4 % (ref 0.0–14.0)
NEUT#: 8.3 10e3/uL — ABNORMAL HIGH (ref 1.5–6.5)
NEUT%: 84.9 % — ABNORMAL HIGH (ref 38.4–76.8)
Platelets: 219 10e3/uL (ref 145–400)
RBC: 3.48 10e6/uL — ABNORMAL LOW (ref 3.70–5.45)
RDW: 16.1 % — ABNORMAL HIGH (ref 11.2–14.5)
WBC: 9.8 10e3/uL (ref 3.9–10.3)
lymph#: 0.8 10e3/uL — ABNORMAL LOW (ref 0.9–3.3)

## 2016-07-14 MED ORDER — DARBEPOETIN ALFA 100 MCG/0.5ML IJ SOSY
100.0000 ug | PREFILLED_SYRINGE | Freq: Once | INTRAMUSCULAR | Status: AC
  Administered 2016-07-14: 100 ug via SUBCUTANEOUS
  Filled 2016-07-14: qty 0.5

## 2016-07-23 ENCOUNTER — Ambulatory Visit: Payer: Medicare Other

## 2016-07-23 ENCOUNTER — Other Ambulatory Visit (HOSPITAL_BASED_OUTPATIENT_CLINIC_OR_DEPARTMENT_OTHER): Payer: Medicare Other

## 2016-07-23 ENCOUNTER — Ambulatory Visit (HOSPITAL_BASED_OUTPATIENT_CLINIC_OR_DEPARTMENT_OTHER): Payer: Medicare Other | Admitting: Oncology

## 2016-07-23 ENCOUNTER — Telehealth: Payer: Self-pay | Admitting: Oncology

## 2016-07-23 VITALS — BP 149/62 | HR 63 | Temp 97.7°F | Resp 18 | Ht <= 58 in | Wt 247.5 lb

## 2016-07-23 DIAGNOSIS — N189 Chronic kidney disease, unspecified: Secondary | ICD-10-CM

## 2016-07-23 DIAGNOSIS — D631 Anemia in chronic kidney disease: Secondary | ICD-10-CM | POA: Diagnosis not present

## 2016-07-23 DIAGNOSIS — M3500 Sicca syndrome, unspecified: Secondary | ICD-10-CM

## 2016-07-23 DIAGNOSIS — I4891 Unspecified atrial fibrillation: Secondary | ICD-10-CM

## 2016-07-23 DIAGNOSIS — M1990 Unspecified osteoarthritis, unspecified site: Secondary | ICD-10-CM

## 2016-07-23 DIAGNOSIS — D649 Anemia, unspecified: Secondary | ICD-10-CM

## 2016-07-23 DIAGNOSIS — R1031 Right lower quadrant pain: Secondary | ICD-10-CM

## 2016-07-23 LAB — CBC WITH DIFFERENTIAL/PLATELET
BASO%: 0.1 % (ref 0.0–2.0)
Basophils Absolute: 0 10*3/uL (ref 0.0–0.1)
EOS%: 0.6 % (ref 0.0–7.0)
Eosinophils Absolute: 0.1 10*3/uL (ref 0.0–0.5)
HCT: 33.7 % — ABNORMAL LOW (ref 34.8–46.6)
HGB: 11.2 g/dL — ABNORMAL LOW (ref 11.6–15.9)
LYMPH%: 9.7 % — ABNORMAL LOW (ref 14.0–49.7)
MCH: 30.1 pg (ref 25.1–34.0)
MCHC: 33.2 g/dL (ref 31.5–36.0)
MCV: 90.6 fL (ref 79.5–101.0)
MONO#: 0.5 10*3/uL (ref 0.1–0.9)
MONO%: 5.9 % (ref 0.0–14.0)
NEUT#: 7.3 10*3/uL — ABNORMAL HIGH (ref 1.5–6.5)
NEUT%: 83.7 % — ABNORMAL HIGH (ref 38.4–76.8)
Platelets: 240 10*3/uL (ref 145–400)
RBC: 3.72 10*6/uL (ref 3.70–5.45)
RDW: 15.3 % — ABNORMAL HIGH (ref 11.2–14.5)
WBC: 8.8 10*3/uL (ref 3.9–10.3)
lymph#: 0.9 10*3/uL (ref 0.9–3.3)

## 2016-07-23 NOTE — Telephone Encounter (Signed)
Gave patient avs report and appointments for February thru April.

## 2016-07-23 NOTE — Progress Notes (Signed)
  Tricia Gonzales OFFICE PROGRESS NOTE   Diagnosis: Anemia, renal insufficiency  INTERVAL HISTORY:   Tricia Gonzales returns as scheduled. The hemoglobin and iron stores were lower last month. She received IV iron on 06/25/2016 and 07/02/2016. Aranesp was changed back to a 2 week schedule. She continues to have malaise. She takes iron once daily. No bleeding. She reports intermittent pain in the right lower abdomen. This is a chronic complaint. She reports being followed by Dr. Tamala Julian for this and had an MRI at [facility] in 2016 that revealed a stable cystic structure deep to the right iliopsoas muscle compared to a CT from August 2014.  Objective:  Vital signs in last 24 hours:  Blood pressure (!) 149/62, pulse 63, temperature 97.7 F (36.5 C), temperature source Oral, resp. rate 18, height _0  (1.473 m), weight 247 lb 8 oz (112.3 kg), SpO2 97 %.    Resp: Good air movement bilaterally, no respiratory distress, inspiratory rhonchi at the posterior bases bilaterally Cardio: Regular rate and rhythm GI: Nontender, no hepatosplenomegaly   Lab Results:  Lab Results  Component Value Date   WBC 8.8 07/23/2016   HGB 11.2 (L) 07/23/2016   HCT 33.7 (L) 07/23/2016   MCV 90.6 07/23/2016   PLT 240 07/23/2016   NEUTROABS 7.3 (H) 07/23/2016   06/11/2016-ferritin 25, iron 40, TIBC 358, percent saturation 11  Medications: I have reviewed the patient's current medications.  Assessment/Plan: 1. 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 Chronic renal insufficiency  3. Atrial fibrillation-maintained on apixaban  4. Osteoarthritis  5. Diabetes  6. Sjogren's disease  7. Pericarditis February 2017   Disposition:  [name] appears unchanged. The hemoglobin was lower over the past few months and the serum iron studies returned low. The hemoglobin has improved significantly after IV iron. We will check iron studies when she returns for Aranesp on 08/11/2016. The plan is to continue Aranesp on a 3 week schedule beginning 08/11/2016. We will follow the serum iron studies more closely.  She may have chronic microscopic blood loss related to anticoagulation therapy. She reports no gross bleeding. She has a history of gastritis.  25 minutes were spent with the patient today. The majority of the time was used for counseling and coordination of care.  [name], MD  07/23/2016  12:21 PM

## 2016-07-28 ENCOUNTER — Other Ambulatory Visit: Payer: Self-pay | Admitting: Orthopedic Surgery

## 2016-08-05 ENCOUNTER — Encounter (HOSPITAL_BASED_OUTPATIENT_CLINIC_OR_DEPARTMENT_OTHER): Payer: Self-pay | Admitting: *Deleted

## 2016-08-05 NOTE — Progress Notes (Signed)
Pt denies SOB and chest pain. Pt stated that she is under the care of Dr. Cristopher Peru, Cardiology. Pt stated that she had a stress test >10 years ago but denies having a cardiac cath. Pt made aware to stop taking vitamins, fish oil and herbal medications. Do not take any NSAIDs ie: Ibuprofen, Advil, Naproxen, BC and Goody Powder or any medication containing Aspirin. Pt stated that her last dose of Eliquis was 08/04/16 (per MD). LVM with  Surgical Coordinator regarding pt pre-op instructions for [place]; awaiting a return call. Pt stated that her fasting blood glucose ranges between 120-140. Pt made aware of diabetes protocol to take 16 units of Toujeo insulin tonight and no Januvia the morning of surgery. Pt made aware to check BG every 2 hours prior to arrival and interventions for BG < 70 ( drink 4 ounces of Apple Juice ,wait 15 minutes and then recheck BG, if BG is still <70 to call SS). Pt verbalized understanding of all pre-op instructions.

## 2016-08-05 NOTE — Progress Notes (Signed)
Dr. Royce Macadamia, Anesthesia, reviewed pt medical history; pt "okay" no new orders given.

## 2016-08-06 ENCOUNTER — Ambulatory Visit (HOSPITAL_COMMUNITY): Payer: Medicare Other | Admitting: Certified Registered Nurse Anesthetist

## 2016-08-06 ENCOUNTER — Ambulatory Visit (HOSPITAL_BASED_OUTPATIENT_CLINIC_OR_DEPARTMENT_OTHER)
Admission: RE | Admit: 2016-08-06 | Discharge: 2016-08-06 | Disposition: A | Discharge status: Home / Self Care | Payer: Medicare Other | Source: Ambulatory Visit | Attending: Orthopedic Surgery | Admitting: Orthopedic Surgery

## 2016-08-06 ENCOUNTER — Encounter (HOSPITAL_COMMUNITY)
Admission: RE | Disposition: A | Discharge status: Home / Self Care | Payer: Self-pay | Source: Ambulatory Visit | Attending: Orthopedic Surgery

## 2016-08-06 DIAGNOSIS — D631 Anemia in chronic kidney disease: Secondary | ICD-10-CM | POA: Diagnosis not present

## 2016-08-06 DIAGNOSIS — Z833 Family history of diabetes mellitus: Secondary | ICD-10-CM | POA: Insufficient documentation

## 2016-08-06 DIAGNOSIS — I129 Hypertensive chronic kidney disease with stage 1 through stage 4 chronic kidney disease, or unspecified chronic kidney disease: Secondary | ICD-10-CM | POA: Diagnosis not present

## 2016-08-06 DIAGNOSIS — Z950 Presence of cardiac pacemaker: Secondary | ICD-10-CM | POA: Diagnosis not present

## 2016-08-06 DIAGNOSIS — Z96653 Presence of artificial knee joint, bilateral: Secondary | ICD-10-CM | POA: Diagnosis not present

## 2016-08-06 DIAGNOSIS — M3500 Sicca syndrome, unspecified: Secondary | ICD-10-CM | POA: Diagnosis not present

## 2016-08-06 DIAGNOSIS — Z6841 Body Mass Index (BMI) 40.0 and over, adult: Secondary | ICD-10-CM | POA: Diagnosis not present

## 2016-08-06 DIAGNOSIS — M1990 Unspecified osteoarthritis, unspecified site: Secondary | ICD-10-CM | POA: Insufficient documentation

## 2016-08-06 DIAGNOSIS — M109 Gout, unspecified: Secondary | ICD-10-CM | POA: Insufficient documentation

## 2016-08-06 DIAGNOSIS — Z87442 Personal history of urinary calculi: Secondary | ICD-10-CM | POA: Diagnosis not present

## 2016-08-06 DIAGNOSIS — F419 Anxiety disorder, unspecified: Secondary | ICD-10-CM | POA: Diagnosis not present

## 2016-08-06 DIAGNOSIS — E669 Obesity, unspecified: Secondary | ICD-10-CM | POA: Insufficient documentation

## 2016-08-06 DIAGNOSIS — F329 Major depressive disorder, single episode, unspecified: Secondary | ICD-10-CM | POA: Diagnosis not present

## 2016-08-06 DIAGNOSIS — K5790 Diverticulosis of intestine, part unspecified, without perforation or abscess without bleeding: Secondary | ICD-10-CM | POA: Diagnosis not present

## 2016-08-06 DIAGNOSIS — K449 Diaphragmatic hernia without obstruction or gangrene: Secondary | ICD-10-CM | POA: Insufficient documentation

## 2016-08-06 DIAGNOSIS — N183 Chronic kidney disease, stage 3 (moderate): Secondary | ICD-10-CM | POA: Insufficient documentation

## 2016-08-06 DIAGNOSIS — Z8249 Family history of ischemic heart disease and other diseases of the circulatory system: Secondary | ICD-10-CM | POA: Insufficient documentation

## 2016-08-06 DIAGNOSIS — M19041 Primary osteoarthritis, right hand: Secondary | ICD-10-CM | POA: Diagnosis present

## 2016-08-06 DIAGNOSIS — Z7901 Long term (current) use of anticoagulants: Secondary | ICD-10-CM | POA: Insufficient documentation

## 2016-08-06 DIAGNOSIS — G8929 Other chronic pain: Secondary | ICD-10-CM | POA: Diagnosis not present

## 2016-08-06 DIAGNOSIS — Z90710 Acquired absence of both cervix and uterus: Secondary | ICD-10-CM | POA: Diagnosis not present

## 2016-08-06 DIAGNOSIS — E1122 Type 2 diabetes mellitus with diabetic chronic kidney disease: Secondary | ICD-10-CM | POA: Insufficient documentation

## 2016-08-06 DIAGNOSIS — I4891 Unspecified atrial fibrillation: Secondary | ICD-10-CM | POA: Diagnosis not present

## 2016-08-06 DIAGNOSIS — E785 Hyperlipidemia, unspecified: Secondary | ICD-10-CM | POA: Insufficient documentation

## 2016-08-06 DIAGNOSIS — Z9049 Acquired absence of other specified parts of digestive tract: Secondary | ICD-10-CM | POA: Insufficient documentation

## 2016-08-06 DIAGNOSIS — Z87891 Personal history of nicotine dependence: Secondary | ICD-10-CM | POA: Insufficient documentation

## 2016-08-06 DIAGNOSIS — M67441 Ganglion, right hand: Secondary | ICD-10-CM | POA: Diagnosis not present

## 2016-08-06 DIAGNOSIS — M549 Dorsalgia, unspecified: Secondary | ICD-10-CM | POA: Insufficient documentation

## 2016-08-06 DIAGNOSIS — Z888 Allergy status to other drugs, medicaments and biological substances status: Secondary | ICD-10-CM | POA: Insufficient documentation

## 2016-08-06 DIAGNOSIS — K219 Gastro-esophageal reflux disease without esophagitis: Secondary | ICD-10-CM | POA: Insufficient documentation

## 2016-08-06 HISTORY — DX: Cardiac murmur, unspecified: R01.1

## 2016-08-06 HISTORY — PX: MASS EXCISION: SHX2000

## 2016-08-06 HISTORY — DX: Gastro-esophageal reflux disease without esophagitis: K21.9

## 2016-08-06 HISTORY — DX: Unspecified cataract: H26.9

## 2016-08-06 HISTORY — PX: I&D EXTREMITY: SHX5045

## 2016-08-06 LAB — GLUCOSE, CAPILLARY
Glucose-Capillary: 153 mg/dL — ABNORMAL HIGH (ref 65–99)
Glucose-Capillary: 152 mg/dL — ABNORMAL HIGH (ref 65–99)

## 2016-08-06 SURGERY — EXCISION MASS
Anesthesia: Monitor Anesthesia Care | Site: Finger | Laterality: Right

## 2016-08-06 MED ORDER — CEFAZOLIN SODIUM-DEXTROSE 2-4 GM/100ML-% IV SOLN
2.0000 g | INTRAVENOUS | Status: AC
  Administered 2016-08-06: 2 g via INTRAVENOUS
  Filled 2016-08-06: qty 100

## 2016-08-06 MED ORDER — LACTATED RINGERS IV SOLN
INTRAVENOUS | Status: DC
  Administered 2016-08-06: 09:00:00 via INTRAVENOUS

## 2016-08-06 MED ORDER — FENTANYL CITRATE (PF) 100 MCG/2ML IJ SOLN
INTRAMUSCULAR | Status: AC
  Filled 2016-08-06: qty 2

## 2016-08-06 MED ORDER — MEPERIDINE HCL 25 MG/ML IJ SOLN: 6.2500 mg | INTRAMUSCULAR | Status: DC | PRN

## 2016-08-06 MED ORDER — FENTANYL CITRATE (PF) 100 MCG/2ML IJ SOLN
INTRAMUSCULAR | Status: DC | PRN
Start: 2016-08-06 — End: 2016-08-06
  Administered 2016-08-06: 50 ug via INTRAVENOUS

## 2016-08-06 MED ORDER — 0.9 % SODIUM CHLORIDE (POUR BTL) OPTIME
TOPICAL | Status: DC | PRN
  Administered 2016-08-06: 1000 mL

## 2016-08-06 MED ORDER — PROMETHAZINE HCL 25 MG/ML IJ SOLN: 6.2500 mg | INTRAMUSCULAR | Status: DC | PRN

## 2016-08-06 MED ORDER — CHLORHEXIDINE GLUCONATE 4 % EX LIQD: 60.0000 mL | Freq: Once | CUTANEOUS | Status: DC

## 2016-08-06 MED ORDER — LIDOCAINE HCL (PF) 0.5 % IJ SOLN
INTRAMUSCULAR | Status: DC | PRN
  Administered 2016-08-06: 35 mL via INTRAVENOUS

## 2016-08-06 MED ORDER — PROPOFOL 10 MG/ML IV BOLUS
INTRAVENOUS | Status: DC | PRN
  Administered 2016-08-06: 15 mg via INTRAVENOUS

## 2016-08-06 MED ORDER — PROPOFOL 500 MG/50ML IV EMUL
INTRAVENOUS | Status: DC | PRN
  Administered 2016-08-06: 30 ug/kg/min via INTRAVENOUS

## 2016-08-06 MED ORDER — BUPIVACAINE HCL (PF) 0.25 % IJ SOLN
INTRAMUSCULAR | Status: AC
  Filled 2016-08-06: qty 30

## 2016-08-06 MED ORDER — HYDROCODONE-ACETAMINOPHEN 5-325 MG PO TABS: 1.0000 | ORAL_TABLET | Freq: Four times a day (QID) | ORAL | 0 refills | Status: DC | PRN

## 2016-08-06 MED ORDER — FENTANYL CITRATE (PF) 100 MCG/2ML IJ SOLN: 25.0000 ug | INTRAMUSCULAR | Status: DC | PRN

## 2016-08-06 MED ORDER — MIDAZOLAM HCL 2 MG/2ML IJ SOLN: 0.5000 mg | Freq: Once | INTRAMUSCULAR | Status: DC | PRN

## 2016-08-06 MED ORDER — PROPOFOL 10 MG/ML IV BOLUS
INTRAVENOUS | Status: AC
  Filled 2016-08-06: qty 20

## 2016-08-06 MED ORDER — BUPIVACAINE HCL (PF) 0.25 % IJ SOLN
INTRAMUSCULAR | Status: DC | PRN
Start: 2016-08-06 — End: 2016-08-06
  Administered 2016-08-06: 6.5 mL

## 2016-08-06 SURGICAL SUPPLY — 33 items
BANDAGE ACE 4X5 VEL STRL LF (GAUZE/BANDAGES/DRESSINGS) ×2 IMPLANT
BNDG COHESIVE 1X5 TAN STRL LF (GAUZE/BANDAGES/DRESSINGS) ×2 IMPLANT
BNDG ESMARK 4X9 LF (GAUZE/BANDAGES/DRESSINGS) ×2 IMPLANT
BNDG GAUZE ELAST 4 BULKY (GAUZE/BANDAGES/DRESSINGS) ×2 IMPLANT
CORDS BIPOLAR (ELECTRODE) ×2 IMPLANT
CUFF TOURNIQUET SINGLE 18IN (TOURNIQUET CUFF) ×2 IMPLANT
CUFF TOURNIQUET SINGLE 24IN (TOURNIQUET CUFF) IMPLANT
DRSG ADAPTIC 3X8 NADH LF (GAUZE/BANDAGES/DRESSINGS) ×2 IMPLANT
DRSG EMULSION OIL 3X3 NADH (GAUZE/BANDAGES/DRESSINGS) ×2 IMPLANT
DRSG PAD ABDOMINAL 8X10 ST (GAUZE/BANDAGES/DRESSINGS) ×4 IMPLANT
GAUZE SPONGE 4X4 12PLY STRL (GAUZE/BANDAGES/DRESSINGS) ×4 IMPLANT
GAUZE XEROFORM 1X8 LF (GAUZE/BANDAGES/DRESSINGS) ×2 IMPLANT
HANDPIECE INTERPULSE COAX TIP (DISPOSABLE)
KIT BASIN OR (CUSTOM PROCEDURE TRAY) ×2 IMPLANT
KIT ROOM TURNOVER OR (KITS) ×2 IMPLANT
MANIFOLD NEPTUNE II (INSTRUMENTS) ×2 IMPLANT
NEEDLE HYPO 25GX1X1/2 BEV (NEEDLE) ×2 IMPLANT
NS IRRIG 1000ML POUR BTL (IV SOLUTION) ×2 IMPLANT
PACK ORTHO EXTREMITY (CUSTOM PROCEDURE TRAY) ×2 IMPLANT
PAD ARMBOARD 7.5X6 YLW CONV (MISCELLANEOUS) ×4 IMPLANT
SET HNDPC FAN SPRY TIP SCT (DISPOSABLE) IMPLANT
SPONGE LAP 18X18 X RAY DECT (DISPOSABLE) ×2 IMPLANT
SPONGE LAP 4X18 X RAY DECT (DISPOSABLE) ×2 IMPLANT
SUT ETHILON 5 0 P 3 18 (SUTURE) ×1
SUT NYLON ETHILON 5-0 P-3 1X18 (SUTURE) ×1 IMPLANT
SYR CONTROL 10ML LL (SYRINGE) ×2 IMPLANT
TOWEL OR 17X24 6PK STRL BLUE (TOWEL DISPOSABLE) ×2 IMPLANT
TOWEL OR 17X26 10 PK STRL BLUE (TOWEL DISPOSABLE) ×2 IMPLANT
TUBE ANAEROBIC SPECIMEN COL (MISCELLANEOUS) IMPLANT
TUBE CONNECTING 12X1/4 (SUCTIONS) ×2 IMPLANT
UNDERPAD 30X30 (UNDERPADS AND DIAPERS) ×2 IMPLANT
WATER STERILE IRR 1000ML POUR (IV SOLUTION) ×2 IMPLANT
YANKAUER SUCT BULB TIP NO VENT (SUCTIONS) ×2 IMPLANT

## 2016-08-06 NOTE — Discharge Instructions (Addendum)
[facility] Instructions Hand Surgery  Wound Care: Keep your hand elevated above the level of your heart.  Do not allow it to dangle by your side.  Keep the dressing dry and do not remove it unless your doctor advises you to do so.  He will usually change it at the time of your post-op visit.  Moving your fingers is advised to stimulate circulation but will depend on the site of your surgery.  If you have a splint applied, your doctor will advise you regarding movement.  Activity: Do not drive or operate machinery today.  Rest today and then you may return to your normal activity and work as indicated by your physician.  Diet:  Drink liquids today or eat a light diet.  You may resume a regular diet tomorrow.    General expectations: Pain for two to three days. Fingers may become slightly swollen.  Call your doctor if any of the following occur: Severe pain not relieved by pain medication. Elevated temperature. Dressing soaked with blood. Inability to move fingers. White or bluish color to fingers.

## 2016-08-06 NOTE — Anesthesia Postprocedure Evaluation (Addendum)
Anesthesia Post Note  Patient: Tricia Gonzales  Procedure(s) Performed: Procedure(s) (LRB): EXCISION CYST (Right) DEBRIDEMENT PIP RIGHT RING FINGER (Right)  Patient location during evaluation: PACU Anesthesia Type: MAC Level of consciousness: awake and alert, oriented and patient cooperative Pain management: pain level controlled Vital Signs Assessment: vitals unstable and post-procedure vital signs reviewed and stable Respiratory status: nonlabored ventilation, spontaneous breathing and respiratory function stable Cardiovascular status: blood pressure returned to baseline and stable Postop Assessment: no signs of nausea or vomiting Anesthetic complications: no       Last Vitals:  Vitals:   08/06/16 0945 08/06/16 1000  BP: (!) 153/65   Pulse: (!) 59 (!) 59  Resp: 19 16  Temp:  36.3 C    Last Pain:  Vitals:   08/06/16 0649  TempSrc: Oral                 Tiffane Sheldon,E. Brean Carberry

## 2016-08-06 NOTE — Brief Op Note (Signed)
08/06/2016  9:25 AM  PATIENT:  Tricia Gonzales  76 y.o. female  PRE-OPERATIVE DIAGNOSIS:  End stage osteoarthritis right ring finger proximal interphalangeal joint  POST-OPERATIVE DIAGNOSIS:  End stage osteoarthritis right ring finger proximal interphalangeal joint  PROCEDURE:  Procedure(s): EXCISION CYST (Right) DEBRIDEMENT PIP RIGHT RING FINGER (Right)  SURGEON:  Surgeon(s) and Role:    * Daryll Brod, MD - Primary  PHYSICIAN ASSISTANT:   ASSISTANTS: none   ANESTHESIA:   local and regional  EBL:  No intake/output data recorded.  BLOOD ADMINISTERED:none  DRAINS: none   LOCAL MEDICATIONS USED:  BUPIVICAINE   SPECIMEN:  Excision  DISPOSITION OF SPECIMEN:  PATHOLOGY  COUNTS:  YES  TOURNIQUET:  * Missing tourniquet times found for documented tourniquets in log:  [id number] *  DICTATION: .Other Dictation: Dictation Number [id number]  PLAN OF CARE: Discharge to home after PACU  PATIENT DISPOSITION:  PACU - hemodynamically stable.

## 2016-08-06 NOTE — Anesthesia Preprocedure Evaluation (Addendum)
Anesthesia Evaluation  Patient identified by MRN, date of birth, ID band Patient awake    Reviewed: Allergy & Precautions, NPO status , Patient's Chart, lab work & pertinent test results, reviewed documented beta blocker date and time   History of Anesthesia Complications Negative for: history of anesthetic complications  Airway Mallampati: II  TM Distance: >3 FB Neck ROM: Full    Dental  (+) Dental Advisory Given   Pulmonary former smoker,    breath sounds clear to auscultation       Cardiovascular hypertension, Pt. on home beta blockers and Pt. on medications (-) angina+ pacemaker + Valvular Problems/Murmurs  Rhythm:Regular Rate:Normal  '17 ECHO: EF 60-65%, valves OK   Neuro/Psych negative neurological ROS     GI/Hepatic Neg liver ROS, GERD  Medicated and Controlled,  Endo/Other  diabetes (glu 153), Insulin DependentMorbid obesitySjogren's  Renal/GU Renal InsufficiencyRenal disease     Musculoskeletal  (+) Arthritis , Osteoarthritis,    Abdominal (+) + obese,   Peds  Hematology  (+) Blood dyscrasia (eliquis), ,   Anesthesia Other Findings   Reproductive/Obstetrics                            Anesthesia Physical Anesthesia Plan  ASA: III  Anesthesia Plan: Bier Block   Post-op Pain Management:    Induction:   Airway Management Planned: Natural Airway and Nasal Cannula  Additional Equipment:   Intra-op Plan:   Post-operative Plan:   Informed Consent: I have reviewed the patients History and Physical, chart, labs and discussed the procedure including the risks, benefits and alternatives for the proposed anesthesia with the patient or authorized representative who has indicated his/her understanding and acceptance.   Dental advisory given  Plan Discussed with: CRNA and Surgeon  Anesthesia Plan Comments: (Plan routine monitors, IV regional Lidocaine)        Anesthesia  Quick Evaluation

## 2016-08-06 NOTE — Op Note (Signed)
Dictation Number [id number]

## 2016-08-06 NOTE — Op Note (Signed)
NAME:  Tricia Gonzales, SWARM NO.:  [id number]  MEDICAL RECORD NO.:  [id number]  LOCATION:  [facility]                         FACILITY:  [facility]  PHYSICIAN:  Daryll Brod, M.D.       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  POSTOPERATIVE DIAGNOSIS:  Mucoid cyst with degenerative arthritis in proximal interphalangeal joint, right ring finger.  OPERATION:  Excision cyst with debridement of PIP joint, right ring finger.  SURGEON:  Daryll Brod, M.D.  ANESTHESIA:  Forearm-based IV regional with local infiltration metacarpal block.  PLACE OF SURGERY:  Hospital.  ANESTHESIOLOGIST:  Glennon Mac.  HISTORY:  The patient is a 76 year old female with a history of degenerative arthritis of the PIP joint of her right ring finger.  She has developed a large mass on the radial aspect just proximal to the joint.  She is desirous having this excised after local treatment including injections, anti-inflammatories has not given her any significant relief of symptoms.  In preoperative area, the patient was seen, the extremity marked by both patient and surgeon.  Antibiotic given.  PROCEDURE IN DETAIL:  The patient was brought to the operating room, where a forearm-based IV regional anesthetic was carried out without difficulty.  She was prepped using ChloraPrep in a supine position with the right arm free.  A 3-minute dry time was allowed, and time-out taken, confirming the patient and procedure.  A midlateral incision was then made carried across the dorsum of the middle phalanx.  This was carried down through subcutaneous tissue.  Bleeders were electrocauterized with bipolar.  A large cystic mass was immediately encountered.  The extensor tendon was then elevated.  This allowed  the retinacular fibers to be incised allowing visualization of the joint. The mass was excised and a synovectomy performed to the PIP joint.  A rongeur was then used to remove osteophytes from the PIP joint proximal phalanx.  This removed all of the cyst, synovial tissue, and osteophytes.  The wound was copiously irrigated with saline.  The skin was then closed with interrupted 5-0 nylon sutures.  A sterile compressive dressing and splint to the finger was applied.  On deflation of the tourniquet, all fingers pinked.  She was taken to the recovery room for observation in satisfactory condition. She will be discharged to home to return to the [facility] in 1 week, on Norco.          ______________________________ Daryll Brod, M.D.     GK/MEDQ  D:  08/06/2016  T:  08/06/2016  Job:  [id number]

## 2016-08-06 NOTE — Anesthesia Procedure Notes (Signed)
Anesthesia Regional Block:  Bier block (IV Regional)  Pre-Anesthetic Checklist: ,, timeout performed, Correct Patient, Correct Site, Correct Laterality, Correct Procedure, Correct Position, site marked, Risks and benefits discussed,  Surgical consent,  Pre-op evaluation,  At surgeon's request and post-op pain management  Laterality: Right and Lower     Needles:  Injection technique: Single-shot      Needle Gauge: 22 and 22 G    Additional Needles: Bier block (IV Regional) Narrative:  Start time: 08/06/2016 8:50 AM End time: 08/06/2016 8:54 AM  Performed by: Personally  Anesthesiologist: Glennon Mac, Myrtice Lowdermilk  Additional Notes: Pt identified in operating room.  Monitors applied. Working IV access confirmed. Sterile prep.  Esmarck exsanguination R forearm, tourniquet inflated 233m Hg, 35cc 0.5% lidocaine IV.  Patient asymptomatic, VSS, tolerated well.  CJenita Seashore MD

## 2016-08-06 NOTE — Transfer of Care (Signed)
Immediate Anesthesia Transfer of Care Note  Patient: Tricia Gonzales  Procedure(s) Performed: Procedure(s): EXCISION CYST (Right) DEBRIDEMENT PIP RIGHT RING FINGER (Right)  Patient Location: PACU  Anesthesia Type:Bier block  Level of Consciousness: awake, alert  and oriented  Airway & Oxygen Therapy: Patient Spontanous Breathing  Post-op Assessment: Report given to RN and Post -op Vital signs reviewed and stable  Post vital signs: Reviewed and stable  Last Vitals:  Vitals:   08/06/16 0649 08/06/16 0933  BP: (!) 177/46   Pulse: 62   Resp: 18   Temp: 36.6 C (P) 36.7 C    Last Pain:  Vitals:   08/06/16 0649  TempSrc: Oral      Patients Stated Pain Goal: 3 (20/60/15 6153)  Complications: No apparent anesthesia complications

## 2016-08-06 NOTE — H&P (Signed)
Tricia Gonzales is an 76 y.o. female.   Chief Complaint: mass right ring finger HPI: Tricia Gonzales is a 76 year old right-hand-dominant female referred by Dr. Carol Ada for consultation regarding pain in her right middle ring and little fingers of this at PIP and DIP joints. This been going on for months increasing in intensity she complains of a sharp pain occasionally with a VAS score of 8/10 otherwise a throbbing pain with a VAS score of 4 out of 5 to 6/10. She states folding laundry ring out a washcloth increased pain for her. She has no discrete history of injury. She does have a history of arthritis and gout diabetes. She states cold increase her pain for her. Is a history of atrial fibrillation she. She is on Eliquis and has a pacemaker. She has a history of kidney disease. He has been taking gabapentin Tylenol and hydrocodone occasionally for pain. After Lovena Le is her doctor who prescribes her Eliquis. He is not complaining of any numbness or tingling. States nothing seems to make this better. She is unable to tolerate take nonsteroidal anti-inflammatories on a regular basis.This has previously been injected. She has a sizable cyst on the radial aspect. He has CMC arthritis and arthritis of all of her fingers. She is complaining of continued pain. This has not resolved with conservative treatment. Pain is moderate in nature. She has moderate pain to the carpometacarpal joints of the thumbs bilaterally.          Past Medical History:  Diagnosis Date  . Anemia   . 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 Anxiety   . Arthritis   . Atrial fibrillation ([place])   . Chronic back pain   . Chronic kidney disease (CKD), stage III (moderate)   . Depression   . Diverticulosis   . Early cataracts, bilateral   . Esophageal stricture   . Gastritis   . GERD (gastroesophageal reflux disease)   . Heart murmur     " some doctors say that I have one some say that I don"t "  . Hiatal hernia   . History  of blood transfusion    "@ least w/1st knee OR"  . History of gout   . Hyperlipidemia   . Hypertension   . Intestinal obstruction   . Kidney stones   . Obesity   . Osteoarthritis   . Pericarditis   . Presence of permanent cardiac pacemaker   . Sjogren's disease ([place])   . Type II diabetes mellitus ([place])   . Vitamin B12 deficiency     Past Surgical History:  Procedure Laterality Date  . ABDOMINAL HYSTERECTOMY    . CHOLECYSTECTOMY OPEN    . COLECTOMY     for rectovaginal fistula  . COLONOSCOPY  01/07/2012   Procedure: COLONOSCOPY;  Surgeon: Lafayette Dragon, MD;  Location: [facility];  Service: Endoscopy;  Laterality: N/A;  . EP IMPLANTABLE DEVICE N/A 06/18/2015   Procedure: Pacemaker Implant;  Surgeon: Evans Lance, MD;  Location: [facility];  Service: Cardiovascular;  Laterality: N/A;  . ESOPHAGOGASTRODUODENOSCOPY (EGD) WITH ESOPHAGEAL DILATION    . ESOPHAGOGASTRODUODENOSCOPY (EGD) WITH PROPOFOL N/A 08/12/2015   Procedure: ESOPHAGOGASTRODUODENOSCOPY (EGD) WITH PROPOFOL;  Surgeon: Manus Gunning, MD;  Location: [facility];  Service: Gastroenterology;  Laterality: N/A;  . INSERT / REPLACE / REMOVE PACEMAKER    . JOINT REPLACEMENT    . KNEE ARTHROSCOPY Right   . REVISION TOTAL KNEE ARTHROPLASTY Left   . TOTAL KNEE ARTHROPLASTY  Bilateral     Family History  Problem Relation Age of Onset  . Diabetes Mother   . Diabetes Brother   . Hypertension Sister   . Colon cancer Neg Hx   . Heart attack Neg Hx   . Stroke Neg Hx    Social History:  reports that she quit smoking about 33 years ago. Her smoking use included Cigarettes. She has a 6.00 pack-year smoking history. She has never used smokeless tobacco. She reports that she drinks about 1.2 oz of alcohol per week . She reports that she does not use drugs.  Allergies:  Allergies  Allergen Reactions  . Norvasc [Amlodipine Besylate] Shortness Of Breath  . Glucophage [Metformin Hydrochloride] Other (See Comments)     "increases creatinine"  . Lisinopril     Headache   . Losartan     Headache   . Morphine And Related Other (See Comments)    "hallucinations"  . Simvastatin Other (See Comments)    Body aches  . Trulicity [Dulaglutide] Other (See Comments)    Severe mood swings    No prescriptions prior to admission.    No results found for this or any previous visit (from the past 48 hour(s)).  No results found.   Pertinent items are noted in HPI.  Weight 112.5 kg (248 lb).  General appearance: alert, cooperative and appears stated age Head: Normocephalic, without obvious abnormality Neck: no JVD Resp: clear to auscultation bilaterally Cardio: regular rate and rhythm, S1, S2 normal, no murmur, click, rub or gallop GI: soft, non-tender; bowel sounds normal; no masses,  no organomegaly Extremities: mass right ring finger Pulses: 2+ and symmetric Skin: Skin color, texture, turgor normal. No rashes or lesions Neurologic: Grossly normal Incision/Wound: na  Assessment/Plan Assessment:  1. Primary osteoarthritis of both hands   2. Mass ring finger  Plan: We have discussed with the possibility of excision of the cyst with debridement of the proximal interphalangeal joint. Pre-peri-and postoperative course are discussed along with risks and complications. She is aware that there is no guarantee to the surgery the possibility of infection recurrence injury to arteries nerves tendons incomplete release symptoms and dystrophy. She is advised potential for stiffness. She would like to proceed. She is scheduled for excision cyst with debridement PIP joint right ring finger as an outpatient under regional anesthesia. Questions are encouraged and answered to her satisfaction.      Thao Vanover R 08/06/2016, 5:00 AM

## 2016-08-06 NOTE — Progress Notes (Signed)
No labs needed DOS per Dr Glennon Mac

## 2016-08-07 ENCOUNTER — Encounter (HOSPITAL_COMMUNITY): Payer: Self-pay | Admitting: Orthopedic Surgery

## 2016-08-11 ENCOUNTER — Other Ambulatory Visit (HOSPITAL_BASED_OUTPATIENT_CLINIC_OR_DEPARTMENT_OTHER): Payer: Medicare Other

## 2016-08-11 ENCOUNTER — Ambulatory Visit: Payer: Medicare Other

## 2016-08-11 DIAGNOSIS — D649 Anemia, unspecified: Secondary | ICD-10-CM

## 2016-08-11 DIAGNOSIS — D631 Anemia in chronic kidney disease: Secondary | ICD-10-CM | POA: Diagnosis not present

## 2016-08-11 DIAGNOSIS — N189 Chronic kidney disease, unspecified: Secondary | ICD-10-CM | POA: Diagnosis not present

## 2016-08-11 LAB — IRON AND TIBC
%SAT: 26 % (ref 21–57)
Iron: 65 ug/dL (ref 41–142)
TIBC: 253 ug/dL (ref 236–444)
UIBC: 187 ug/dL (ref 120–384)

## 2016-08-11 LAB — CBC WITH DIFFERENTIAL/PLATELET
BASO%: 0.3 % (ref 0.0–2.0)
Basophils Absolute: 0 10*3/uL (ref 0.0–0.1)
EOS%: 0.5 % (ref 0.0–7.0)
Eosinophils Absolute: 0 10*3/uL (ref 0.0–0.5)
HCT: 34.6 % — ABNORMAL LOW (ref 34.8–46.6)
HGB: 11.3 g/dL — ABNORMAL LOW (ref 11.6–15.9)
LYMPH%: 9.4 % — ABNORMAL LOW (ref 14.0–49.7)
MCH: 29.8 pg (ref 25.1–34.0)
MCHC: 32.7 g/dL (ref 31.5–36.0)
MCV: 91.4 fL (ref 79.5–101.0)
MONO#: 0.4 10*3/uL (ref 0.1–0.9)
MONO%: 5.1 % (ref 0.0–14.0)
NEUT#: 7.2 10*3/uL — ABNORMAL HIGH (ref 1.5–6.5)
NEUT%: 84.7 % — ABNORMAL HIGH (ref 38.4–76.8)
Platelets: 232 10*3/uL (ref 145–400)
RBC: 3.78 10*6/uL (ref 3.70–5.45)
RDW: 15.2 % — ABNORMAL HIGH (ref 11.2–14.5)
WBC: 8.4 10*3/uL (ref 3.9–10.3)
lymph#: 0.8 10*3/uL — ABNORMAL LOW (ref 0.9–3.3)

## 2016-08-11 LAB — FERRITIN: Ferritin: 185 ng/ml (ref 9–269)

## 2016-08-11 NOTE — Progress Notes (Signed)
Hemoglobin noted at 11.3 on lab today. Injection not given per order protocol. Current labs and schedule given to patient. Instructed to follow schedule and call office if issues occur. Pt verbalized understanding of instructions.

## 2016-08-27 ENCOUNTER — Other Ambulatory Visit: Payer: Self-pay | Admitting: Physician Assistant

## 2016-08-28 NOTE — Telephone Encounter (Signed)
Patient meets criteria for 64m BID based on age <<67 weight>60kg - lengthy discussion about why dose needs to change and patient is very concerned about Eliquis affecting kidney function. Explained that Eliquis does not affect kidney function but is cleared by the kidneys. After lengthy conversation she reports she is just frustrated with medical profession. Offered to have metabolic panel entered for 1 month post dose increase and she states that she will have her PCP order this for her. She is agreeable to increased dose after discussion of risk.

## 2016-08-28 NOTE — Telephone Encounter (Signed)
Called spoke with pt, pt last saw [name] on 06/18/16, last labs done by primary MD 08/24/16 Creat 1.76, Age 76 y/o, weight 112.5kg, based on specified criteria pt is not on the appropriate dosage of Eliquis.  Pt is currently on 2.58m Eliquis BID, pt should be on Eliquis 572mBID.  Attempted to explain this need to change dosage to the pt based on specified criteria, pt resistant to increase dosage of Eliquis.  Transferred pt to KeEino FarberPharmD to discuss in detail.

## 2016-08-31 ENCOUNTER — Other Ambulatory Visit: Payer: Self-pay | Admitting: *Deleted

## 2016-08-31 DIAGNOSIS — N189 Chronic kidney disease, unspecified: Principal | ICD-10-CM

## 2016-08-31 DIAGNOSIS — D631 Anemia in chronic kidney disease: Secondary | ICD-10-CM

## 2016-09-01 ENCOUNTER — Other Ambulatory Visit (HOSPITAL_BASED_OUTPATIENT_CLINIC_OR_DEPARTMENT_OTHER): Payer: Medicare Other

## 2016-09-01 ENCOUNTER — Ambulatory Visit: Payer: Medicare Other

## 2016-09-01 DIAGNOSIS — N189 Chronic kidney disease, unspecified: Secondary | ICD-10-CM

## 2016-09-01 DIAGNOSIS — D631 Anemia in chronic kidney disease: Secondary | ICD-10-CM | POA: Diagnosis not present

## 2016-09-01 LAB — CBC WITH DIFFERENTIAL/PLATELET
BASO%: 0.1 % (ref 0.0–2.0)
Basophils Absolute: 0 10*3/uL (ref 0.0–0.1)
EOS%: 0.6 % (ref 0.0–7.0)
Eosinophils Absolute: 0.1 10*3/uL (ref 0.0–0.5)
HCT: 33.3 % — ABNORMAL LOW (ref 34.8–46.6)
HGB: 11 g/dL — ABNORMAL LOW (ref 11.6–15.9)
LYMPH%: 11.9 % — ABNORMAL LOW (ref 14.0–49.7)
MCH: 29.7 pg (ref 25.1–34.0)
MCHC: 33 g/dL (ref 31.5–36.0)
MCV: 90 fL (ref 79.5–101.0)
MONO#: 0.5 10*3/uL (ref 0.1–0.9)
MONO%: 5.3 % (ref 0.0–14.0)
NEUT#: 8.2 10*3/uL — ABNORMAL HIGH (ref 1.5–6.5)
NEUT%: 82.1 % — ABNORMAL HIGH (ref 38.4–76.8)
Platelets: 231 10*3/uL (ref 145–400)
RBC: 3.7 10*6/uL (ref 3.70–5.45)
RDW: 14 % (ref 11.2–14.5)
WBC: 9.9 10*3/uL (ref 3.9–10.3)
lymph#: 1.2 10*3/uL (ref 0.9–3.3)
nRBC: 0 % (ref 0–0)

## 2016-09-01 NOTE — Progress Notes (Signed)
Hemoglobin noted at 11.0 today. No injection needed per Protocol order. Pt given copy of current labs and current schedule. Instructed to follow schedule and to call office should issues occur. Pt verbalized understanding of instrutions.

## 2016-09-17 ENCOUNTER — Telehealth: Payer: Self-pay | Admitting: Cardiology

## 2016-09-17 ENCOUNTER — Ambulatory Visit (INDEPENDENT_AMBULATORY_CARE_PROVIDER_SITE_OTHER): Payer: Medicare Other | Admitting: *Deleted

## 2016-09-17 DIAGNOSIS — I442 Atrioventricular block, complete: Secondary | ICD-10-CM

## 2016-09-17 NOTE — Progress Notes (Signed)
Remote pacemaker transmission.

## 2016-09-17 NOTE — Telephone Encounter (Signed)
Spoke with pt and reminded pt of remote transmission that is due today. Pt verbalized understanding.   

## 2016-09-18 ENCOUNTER — Encounter: Payer: Self-pay | Admitting: Cardiology

## 2016-09-18 LAB — CUP PACEART REMOTE DEVICE CHECK
Battery Remaining Longevity: 99 mo
Battery Voltage: 3.01 V
Brady Statistic AP VP Percent: 62.84 %
Brady Statistic AP VS Percent: 4.32 %
Brady Statistic AS VP Percent: 2.65 %
Brady Statistic AS VS Percent: 30.19 %
Brady Statistic RA Percent Paced: 67.14 %
Brady Statistic RV Percent Paced: 65.64 %
Date Time Interrogation Session: 20180329160630
Implantable Lead Implant Date: 20161227
Implantable Lead Implant Date: 20161227
Implantable Lead Location: 753859
Implantable Lead Location: 753860
Implantable Lead Model: 5076
Implantable Lead Model: 5076
Implantable Pulse Generator Implant Date: 20161227
Lead Channel Impedance Value: 342 Ohm
Lead Channel Impedance Value: 342 Ohm
Lead Channel Impedance Value: 380 Ohm
Lead Channel Impedance Value: 418 Ohm
Lead Channel Pacing Threshold Amplitude: 0.625 V
Lead Channel Pacing Threshold Amplitude: 1.125 V
Lead Channel Pacing Threshold Pulse Width: 0.4 ms
Lead Channel Pacing Threshold Pulse Width: 0.4 ms
Lead Channel Sensing Intrinsic Amplitude: 10.625 mV
Lead Channel Sensing Intrinsic Amplitude: 10.625 mV
Lead Channel Sensing Intrinsic Amplitude: 2.875 mV
Lead Channel Sensing Intrinsic Amplitude: 2.875 mV
Lead Channel Setting Pacing Amplitude: 2 V
Lead Channel Setting Pacing Amplitude: 2.5 V
Lead Channel Setting Pacing Pulse Width: 0.4 ms
Lead Channel Setting Sensing Sensitivity: 2.8 mV

## 2016-09-22 ENCOUNTER — Other Ambulatory Visit (HOSPITAL_BASED_OUTPATIENT_CLINIC_OR_DEPARTMENT_OTHER): Payer: Medicare Other

## 2016-09-22 ENCOUNTER — Telehealth: Payer: Self-pay | Admitting: Oncology

## 2016-09-22 ENCOUNTER — Ambulatory Visit (HOSPITAL_BASED_OUTPATIENT_CLINIC_OR_DEPARTMENT_OTHER): Payer: Medicare Other

## 2016-09-22 ENCOUNTER — Ambulatory Visit (HOSPITAL_BASED_OUTPATIENT_CLINIC_OR_DEPARTMENT_OTHER): Payer: Medicare Other | Admitting: Oncology

## 2016-09-22 VITALS — BP 149/70 | HR 87 | Temp 98.1°F | Resp 18 | Ht <= 58 in | Wt 251.2 lb

## 2016-09-22 DIAGNOSIS — D631 Anemia in chronic kidney disease: Secondary | ICD-10-CM

## 2016-09-22 DIAGNOSIS — M1990 Unspecified osteoarthritis, unspecified site: Secondary | ICD-10-CM

## 2016-09-22 DIAGNOSIS — N183 Chronic kidney disease, stage 3 unspecified: Secondary | ICD-10-CM

## 2016-09-22 DIAGNOSIS — E119 Type 2 diabetes mellitus without complications: Secondary | ICD-10-CM | POA: Diagnosis not present

## 2016-09-22 DIAGNOSIS — M3500 Sicca syndrome, unspecified: Secondary | ICD-10-CM | POA: Diagnosis not present

## 2016-09-22 DIAGNOSIS — N189 Chronic kidney disease, unspecified: Secondary | ICD-10-CM

## 2016-09-22 DIAGNOSIS — I4891 Unspecified atrial fibrillation: Secondary | ICD-10-CM | POA: Diagnosis not present

## 2016-09-22 DIAGNOSIS — D649 Anemia, unspecified: Secondary | ICD-10-CM

## 2016-09-22 LAB — CBC WITH DIFFERENTIAL/PLATELET
BASO%: 0.2 % (ref 0.0–2.0)
Basophils Absolute: 0 10*3/uL (ref 0.0–0.1)
EOS%: 0.5 % (ref 0.0–7.0)
Eosinophils Absolute: 0 10*3/uL (ref 0.0–0.5)
HCT: 29.8 % — ABNORMAL LOW (ref 34.8–46.6)
HGB: 9.9 g/dL — ABNORMAL LOW (ref 11.6–15.9)
LYMPH%: 10.9 % — ABNORMAL LOW (ref 14.0–49.7)
MCH: 30.2 pg (ref 25.1–34.0)
MCHC: 33.3 g/dL (ref 31.5–36.0)
MCV: 90.7 fL (ref 79.5–101.0)
MONO#: 0.5 10*3/uL (ref 0.1–0.9)
MONO%: 7.1 % (ref 0.0–14.0)
NEUT#: 6 10*3/uL (ref 1.5–6.5)
NEUT%: 81.3 % — ABNORMAL HIGH (ref 38.4–76.8)
Platelets: 233 10*3/uL (ref 145–400)
RBC: 3.29 10*6/uL — ABNORMAL LOW (ref 3.70–5.45)
RDW: 15.5 % — ABNORMAL HIGH (ref 11.2–14.5)
WBC: 7.4 10*3/uL (ref 3.9–10.3)
lymph#: 0.8 10*3/uL — ABNORMAL LOW (ref 0.9–3.3)

## 2016-09-22 LAB — IRON AND TIBC
%SAT: 22 % (ref 21–57)
Iron: 57 ug/dL (ref 41–142)
TIBC: 265 ug/dL (ref 236–444)
UIBC: 208 ug/dL (ref 120–384)

## 2016-09-22 LAB — FERRITIN: Ferritin: 119 ng/ml (ref 9–269)

## 2016-09-22 MED ORDER — DARBEPOETIN ALFA 100 MCG/0.5ML IJ SOSY
100.0000 ug | PREFILLED_SYRINGE | Freq: Once | INTRAMUSCULAR | Status: AC
  Administered 2016-09-22: 100 ug via SUBCUTANEOUS
  Filled 2016-09-22: qty 0.5

## 2016-09-22 NOTE — Progress Notes (Signed)
  [facility] OFFICE PROGRESS NOTE   Diagnosis: Anemia, renal insufficiency  INTERVAL HISTORY:   Tricia Gonzales returns as scheduled. She is taking iron. She denies bleeding. She last received Aranesp on 07/14/2016. The hemoglobin was above goal range when she was here 07/23/2016 and 08/11/2016 and 09/01/2016. She received IV iron in January.  Objective:  Vital signs in last 24 hours:  Blood pressure (!) 149/70, pulse 87, temperature 98.1 F (36.7 C), temperature source Oral, resp. rate 18, height _0  (1.473 m), weight 251 lb 3.2 oz (113.9 kg), SpO2 92 %.  Resp: End inspiratory rhonchi at the left greater than right posterior base, no respiratory distress Cardio: Regular rate and rhythm GI: No hepatosplenomegaly Vascular: Trace pitting edema at the right greater than left lower leg   Lab Results:  Lab Results  Component Value Date   WBC 7.4 09/22/2016   HGB 9.9 (L) 09/22/2016   HCT 29.8 (L) 09/22/2016   MCV 90.7 09/22/2016   PLT 233 09/22/2016   NEUTROABS 6.0 09/22/2016     Medications: I have reviewed the patient's current medications.  Assessment/Plan: 1. 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; quantitative immunoglobulins show IgG mildly decreased 654 (range [phone]), IgA and IgM in normal range; polyclonal serum light chain elevation  11/15/2015-hemoglobin 11.2  11/29/2015-hemoglobin 11.1  12/12/2005-hemoglobin 10.4; Aranesp 100 g every 2 weeks initiated  02/07/2016-hemoglobin 11.0, Aranesp held, changed to an every 3  week schedule  Progressive anemia and decreased iron stores December 2017, treated with IV iron January 2018 with improvement  2. Chronic renal insufficiency  3. Atrial fibrillation-maintained on apixaban  4. Osteoarthritis  5. Diabetes  6. Sjogren's disease  7. Pericarditis February 2017    Disposition:  The hemoglobin has dropped over the past 2 months for all of erythropoietin therapy. The plan is to resume Aranesp on a monthly schedule. She continues oral iron. We will check serum iron studies when she returns in 2 months and administer IV iron as needed. She is seeing Dr. Moshe Cipro for management of renal insufficiency.  Ms. Citro will return for an office visit in 4 months.  [name], MD  09/22/2016  11:38 AM

## 2016-09-22 NOTE — Patient Instructions (Signed)
Darbepoetin Alfa injection What is this medicine? DARBEPOETIN ALFA (dar be POE e tin AL fa) helps your body make more red blood cells. It is used to treat anemia caused by chronic kidney failure and chemotherapy. This medicine may be used for other purposes; ask your health care provider or pharmacist if you have questions. What should I tell my health care provider before I take this medicine? They need to know if you have any of these conditions: -blood clotting disorders or history of blood clots -cancer patient not on chemotherapy -cystic fibrosis -heart disease, such as angina, heart failure, or a history of a heart attack -hemoglobin level of 12 g/dL or greater -high blood pressure -low levels of folate, iron, or vitamin B12 -seizures -an unusual or allergic reaction to darbepoetin, erythropoietin, albumin, hamster proteins, latex, other medicines, foods, dyes, or preservatives -pregnant or trying to get pregnant -breast-feeding How should I use this medicine? This medicine is for injection into a vein or under the skin. It is usually given by a health care professional in a hospital or clinic setting. If you get this medicine at home, you will be taught how to prepare and give this medicine. Do not shake the solution before you withdraw a dose. Use exactly as directed. Take your medicine at regular intervals. Do not take your medicine more often than directed. It is important that you put your used needles and syringes in a special sharps container. Do not put them in a trash can. If you do not have a sharps container, call your pharmacist or healthcare provider to get one. Talk to your pediatrician regarding the use of this medicine in children. While this medicine may be used in children as young as 1 year for selected conditions, precautions do apply. Overdosage: If you think you have taken too much of this medicine contact a poison control center or emergency room at once. NOTE:  This medicine is only for you. Do not share this medicine with others. What if I miss a dose? If you miss a dose, take it as soon as you can. If it is almost time for your next dose, take only that dose. Do not take double or extra doses. What may interact with this medicine? Do not take this medicine with any of the following medications: -epoetin alfa This list may not describe all possible interactions. Give your health care provider a list of all the medicines, herbs, non-prescription drugs, or dietary supplements you use. Also tell them if you smoke, drink alcohol, or use illegal drugs. Some items may interact with your medicine. What should I watch for while using this medicine? Visit your prescriber or health care professional for regular checks on your progress and for the needed blood tests and blood pressure measurements. It is especially important for the doctor to make sure your hemoglobin level is in the desired range, to limit the risk of potential side effects and to give you the best benefit. Keep all appointments for any recommended tests. Check your blood pressure as directed. Ask your doctor what your blood pressure should be and when you should contact him or her. As your body makes more red blood cells, you may need to take iron, folic acid, or vitamin B supplements. Ask your doctor or health care provider which products are right for you. If you have kidney disease continue dietary restrictions, even though this medication can make you feel better. Talk with your doctor or health care professional about the   foods you eat and the vitamins that you take. What side effects may I notice from receiving this medicine? Side effects that you should report to your doctor or health care professional as soon as possible: -allergic reactions like skin rash, itching or hives, swelling of the face, lips, or tongue -breathing problems -changes in vision -chest pain -confusion, trouble speaking  or understanding -feeling faint or lightheaded, falls -high blood pressure -muscle aches or pains -pain, swelling, warmth in the leg -rapid weight gain -severe headaches -sudden numbness or weakness of the face, arm or leg -trouble walking, dizziness, loss of balance or coordination -seizures (convulsions) -swelling of the ankles, feet, hands -unusually weak or tired Side effects that usually do not require medical attention (report to your doctor or health care professional if they continue or are bothersome): -diarrhea -fever, chills (flu-like symptoms) -headaches -nausea, vomiting -redness, stinging, or swelling at site where injected This list may not describe all possible side effects. Call your doctor for medical advice about side effects. You may report side effects to FDA at 1-800-FDA-1088. Where should I keep my medicine? Keep out of the reach of children. Store in a refrigerator between 2 and 8 degrees C (36 and 46 degrees F). Do not freeze. Do not shake. Throw away any unused portion if using a single-dose vial. Throw away any unused medicine after the expiration date. NOTE: This sheet is a summary. It may not cover all possible information. If you have questions about this medicine, talk to your doctor, pharmacist, or health care provider.    2016, Elsevier/[employer]. (2008-05-22 10:23:57)

## 2016-09-22 NOTE — Telephone Encounter (Signed)
Gave patient AVS and calender per 09/22/2016 los.

## 2016-10-06 ENCOUNTER — Other Ambulatory Visit: Payer: Self-pay | Admitting: Internal Medicine

## 2016-10-15 ENCOUNTER — Telehealth: Payer: Self-pay | Admitting: *Deleted

## 2016-10-15 NOTE — Telephone Encounter (Signed)
Information has been submitted to pts insurance for verification of benefits. Awaiting response for coverage

## 2016-10-16 NOTE — Telephone Encounter (Signed)
Verification of benefits have been processed and an approval has been received for pts prolia injection. Pts estimated cost are appx $0. This is only an estimate and cannot be confirmed until benefits are paid. Please advise pt and schedule if needed. If scheduled, once the injection is received, pls contact me back with the date it was received so that I am able to update prolia folder. thanks   Injection can be scheduled after 12/03/2016

## 2016-10-19 ENCOUNTER — Telehealth: Payer: Self-pay

## 2016-10-19 ENCOUNTER — Other Ambulatory Visit: Payer: Self-pay | Admitting: Adult Health

## 2016-10-19 DIAGNOSIS — Z862 Personal history of diseases of the blood and blood-forming organs and certain disorders involving the immune mechanism: Secondary | ICD-10-CM

## 2016-10-19 NOTE — Telephone Encounter (Signed)
Called and LVM advising patient of Prolia information. Advised to call back to schedule injection after stated date. Gave call back number.   

## 2016-10-19 NOTE — Telephone Encounter (Signed)
Called and LVM advising patient of Prolia information. Advised to call back to schedule injection after stated date. Gave call back number.

## 2016-10-20 ENCOUNTER — Other Ambulatory Visit (HOSPITAL_BASED_OUTPATIENT_CLINIC_OR_DEPARTMENT_OTHER): Payer: Medicare Other

## 2016-10-20 ENCOUNTER — Ambulatory Visit: Payer: Medicare Other

## 2016-10-20 ENCOUNTER — Other Ambulatory Visit: Payer: Medicare Other

## 2016-10-20 ENCOUNTER — Ambulatory Visit (HOSPITAL_BASED_OUTPATIENT_CLINIC_OR_DEPARTMENT_OTHER): Payer: Medicare Other

## 2016-10-20 VITALS — BP 182/52 | HR 70 | Temp 97.7°F

## 2016-10-20 DIAGNOSIS — D631 Anemia in chronic kidney disease: Secondary | ICD-10-CM

## 2016-10-20 DIAGNOSIS — N183 Chronic kidney disease, stage 3 unspecified: Secondary | ICD-10-CM

## 2016-10-20 DIAGNOSIS — N189 Chronic kidney disease, unspecified: Secondary | ICD-10-CM | POA: Diagnosis not present

## 2016-10-20 DIAGNOSIS — Z862 Personal history of diseases of the blood and blood-forming organs and certain disorders involving the immune mechanism: Secondary | ICD-10-CM

## 2016-10-20 LAB — CBC WITH DIFFERENTIAL/PLATELET
BASO%: 0.3 % (ref 0.0–2.0)
Basophils Absolute: 0 10*3/uL (ref 0.0–0.1)
EOS%: 0.3 % (ref 0.0–7.0)
Eosinophils Absolute: 0 10*3/uL (ref 0.0–0.5)
HCT: 30.8 % — ABNORMAL LOW (ref 34.8–46.6)
HGB: 10.1 g/dL — ABNORMAL LOW (ref 11.6–15.9)
LYMPH%: 11.2 % — ABNORMAL LOW (ref 14.0–49.7)
MCH: 30.2 pg (ref 25.1–34.0)
MCHC: 32.8 g/dL (ref 31.5–36.0)
MCV: 91.8 fL (ref 79.5–101.0)
MONO#: 0.5 10*3/uL (ref 0.1–0.9)
MONO%: 5.9 % (ref 0.0–14.0)
NEUT#: 6.4 10*3/uL (ref 1.5–6.5)
NEUT%: 82.3 % — ABNORMAL HIGH (ref 38.4–76.8)
Platelets: 269 10*3/uL (ref 145–400)
RBC: 3.35 10*6/uL — ABNORMAL LOW (ref 3.70–5.45)
RDW: 15 % — ABNORMAL HIGH (ref 11.2–14.5)
WBC: 7.8 10*3/uL (ref 3.9–10.3)
lymph#: 0.9 10*3/uL (ref 0.9–3.3)

## 2016-10-20 LAB — COMPREHENSIVE METABOLIC PANEL
ALT: 10 U/L (ref 0–55)
AST: 15 U/L (ref 5–34)
Albumin: 3.5 g/dL (ref 3.5–5.0)
Alkaline Phosphatase: 58 U/L (ref 40–150)
Anion Gap: 8 mEq/L (ref 3–11)
BUN: 57.3 mg/dL — ABNORMAL HIGH (ref 7.0–26.0)
CO2: 28 mEq/L (ref 22–29)
Calcium: 9.4 mg/dL (ref 8.4–10.4)
Chloride: 105 mEq/L (ref 98–109)
Creatinine: 1.6 mg/dL — ABNORMAL HIGH (ref 0.6–1.1)
EGFR: 31 mL/min/{1.73_m2} — ABNORMAL LOW (ref >90)
Glucose: 179 mg/dl — ABNORMAL HIGH (ref 70–140)
Potassium: 4.9 mEq/L (ref 3.5–5.1)
Sodium: 141 mEq/L (ref 136–145)
Total Bilirubin: 0.35 mg/dL (ref 0.20–1.20)
Total Protein: 6.7 g/dL (ref 6.4–8.3)

## 2016-10-20 LAB — FERRITIN: Ferritin: 64 ng/ml (ref 9–269)

## 2016-10-20 LAB — IRON AND TIBC
%SAT: 17 % — ABNORMAL LOW (ref 21–57)
Iron: 53 ug/dL (ref 41–142)
TIBC: 303 ug/dL (ref 236–444)
UIBC: 250 ug/dL (ref 120–384)

## 2016-10-20 MED ORDER — DARBEPOETIN ALFA 100 MCG/0.5ML IJ SOSY
100.0000 ug | PREFILLED_SYRINGE | Freq: Once | INTRAMUSCULAR | Status: AC
  Administered 2016-10-20: 100 ug via SUBCUTANEOUS
  Filled 2016-10-20: qty 0.5

## 2016-10-20 NOTE — Patient Instructions (Signed)

## 2016-10-20 NOTE — Progress Notes (Signed)
BP 182/53. Dr. Benay Spice aware and to proceed with aranesp today.

## 2016-11-17 ENCOUNTER — Other Ambulatory Visit (HOSPITAL_BASED_OUTPATIENT_CLINIC_OR_DEPARTMENT_OTHER): Payer: Medicare Other

## 2016-11-17 ENCOUNTER — Ambulatory Visit (HOSPITAL_BASED_OUTPATIENT_CLINIC_OR_DEPARTMENT_OTHER): Payer: Medicare Other

## 2016-11-17 ENCOUNTER — Other Ambulatory Visit: Payer: Medicare Other

## 2016-11-17 ENCOUNTER — Ambulatory Visit: Payer: Medicare Other

## 2016-11-17 VITALS — BP 156/74 | HR 64 | Temp 97.4°F | Resp 20

## 2016-11-17 DIAGNOSIS — D631 Anemia in chronic kidney disease: Secondary | ICD-10-CM

## 2016-11-17 DIAGNOSIS — N183 Chronic kidney disease, stage 3 unspecified: Secondary | ICD-10-CM

## 2016-11-17 DIAGNOSIS — N189 Chronic kidney disease, unspecified: Secondary | ICD-10-CM | POA: Diagnosis not present

## 2016-11-17 LAB — CBC WITH DIFFERENTIAL/PLATELET
BASO%: 0.2 % (ref 0.0–2.0)
Basophils Absolute: 0 10*3/uL (ref 0.0–0.1)
EOS%: 0.8 % (ref 0.0–7.0)
Eosinophils Absolute: 0.1 10*3/uL (ref 0.0–0.5)
HCT: 32.4 % — ABNORMAL LOW (ref 34.8–46.6)
HGB: 10.7 g/dL — ABNORMAL LOW (ref 11.6–15.9)
LYMPH%: 12.8 % — ABNORMAL LOW (ref 14.0–49.7)
MCH: 30.4 pg (ref 25.1–34.0)
MCHC: 33.1 g/dL (ref 31.5–36.0)
MCV: 91.9 fL (ref 79.5–101.0)
MONO#: 0.5 10*3/uL (ref 0.1–0.9)
MONO%: 6.1 % (ref 0.0–14.0)
NEUT#: 6.2 10*3/uL (ref 1.5–6.5)
NEUT%: 80.1 % — ABNORMAL HIGH (ref 38.4–76.8)
Platelets: 248 10*3/uL (ref 145–400)
RBC: 3.53 10*6/uL — ABNORMAL LOW (ref 3.70–5.45)
RDW: 13.9 % (ref 11.2–14.5)
WBC: 7.7 10*3/uL (ref 3.9–10.3)
lymph#: 1 10*3/uL (ref 0.9–3.3)

## 2016-11-17 MED ORDER — DARBEPOETIN ALFA 100 MCG/0.5ML IJ SOSY
100.0000 ug | PREFILLED_SYRINGE | Freq: Once | INTRAMUSCULAR | Status: AC
  Administered 2016-11-17: 100 ug via SUBCUTANEOUS
  Filled 2016-11-17: qty 0.5

## 2016-11-17 NOTE — Patient Instructions (Signed)
Darbepoetin Alfa injection What is this medicine? DARBEPOETIN ALFA (dar be POE e tin AL fa) helps your body make more red blood cells. It is used to treat anemia caused by chronic kidney failure and chemotherapy. This medicine may be used for other purposes; ask your health care provider or pharmacist if you have questions. What should I tell my health care provider before I take this medicine? They need to know if you have any of these conditions: -blood clotting disorders or history of blood clots -cancer patient not on chemotherapy -cystic fibrosis -heart disease, such as angina, heart failure, or a history of a heart attack -hemoglobin level of 12 g/dL or greater -high blood pressure -low levels of folate, iron, or vitamin B12 -seizures -an unusual or allergic reaction to darbepoetin, erythropoietin, albumin, hamster proteins, latex, other medicines, foods, dyes, or preservatives -pregnant or trying to get pregnant -breast-feeding How should I use this medicine? This medicine is for injection into a vein or under the skin. It is usually given by a health care professional in a hospital or clinic setting. If you get this medicine at home, you will be taught how to prepare and give this medicine. Do not shake the solution before you withdraw a dose. Use exactly as directed. Take your medicine at regular intervals. Do not take your medicine more often than directed. It is important that you put your used needles and syringes in a special sharps container. Do not put them in a trash can. If you do not have a sharps container, call your pharmacist or healthcare provider to get one. Talk to your pediatrician regarding the use of this medicine in children. While this medicine may be used in children as young as 1 year for selected conditions, precautions do apply. Overdosage: If you think you have taken too much of this medicine contact a poison control center or emergency room at once. NOTE:  This medicine is only for you. Do not share this medicine with others. What if I miss a dose? If you miss a dose, take it as soon as you can. If it is almost time for your next dose, take only that dose. Do not take double or extra doses. What may interact with this medicine? Do not take this medicine with any of the following medications: -epoetin alfa This list may not describe all possible interactions. Give your health care provider a list of all the medicines, herbs, non-prescription drugs, or dietary supplements you use. Also tell them if you smoke, drink alcohol, or use illegal drugs. Some items may interact with your medicine. What should I watch for while using this medicine? Visit your prescriber or health care professional for regular checks on your progress and for the needed blood tests and blood pressure measurements. It is especially important for the doctor to make sure your hemoglobin level is in the desired range, to limit the risk of potential side effects and to give you the best benefit. Keep all appointments for any recommended tests. Check your blood pressure as directed. Ask your doctor what your blood pressure should be and when you should contact him or her. As your body makes more red blood cells, you may need to take iron, folic acid, or vitamin B supplements. Ask your doctor or health care provider which products are right for you. If you have kidney disease continue dietary restrictions, even though this medication can make you feel better. Talk with your doctor or health care professional about the   foods you eat and the vitamins that you take. What side effects may I notice from receiving this medicine? Side effects that you should report to your doctor or health care professional as soon as possible: -allergic reactions like skin rash, itching or hives, swelling of the face, lips, or tongue -breathing problems -changes in vision -chest pain -confusion, trouble speaking  or understanding -feeling faint or lightheaded, falls -high blood pressure -muscle aches or pains -pain, swelling, warmth in the leg -rapid weight gain -severe headaches -sudden numbness or weakness of the face, arm or leg -trouble walking, dizziness, loss of balance or coordination -seizures (convulsions) -swelling of the ankles, feet, hands -unusually weak or tired Side effects that usually do not require medical attention (report to your doctor or health care professional if they continue or are bothersome): -diarrhea -fever, chills (flu-like symptoms) -headaches -nausea, vomiting -redness, stinging, or swelling at site where injected This list may not describe all possible side effects. Call your doctor for medical advice about side effects. You may report side effects to FDA at 1-800-FDA-1088. Where should I keep my medicine? Keep out of the reach of children. Store in a refrigerator between 2 and 8 degrees C (36 and 46 degrees F). Do not freeze. Do not shake. Throw away any unused portion if using a single-dose vial. Throw away any unused medicine after the expiration date. NOTE: This sheet is a summary. It may not cover all possible information. If you have questions about this medicine, talk to your doctor, pharmacist, or health care provider.    2016, Elsevier/[employer]. (2008-05-22 10:23:57)

## 2016-11-18 LAB — IRON AND TIBC
%SAT: 17 % — ABNORMAL LOW (ref 21–57)
Iron: 55 ug/dL (ref 41–142)
TIBC: 323 ug/dL (ref 236–444)
UIBC: 268 ug/dL (ref 120–384)

## 2016-11-18 LAB — FERRITIN: Ferritin: 54 ng/ml (ref 9–269)

## 2016-11-19 ENCOUNTER — Other Ambulatory Visit: Payer: Self-pay | Admitting: *Deleted

## 2016-11-19 ENCOUNTER — Telehealth: Payer: Self-pay | Admitting: *Deleted

## 2016-11-19 DIAGNOSIS — N189 Chronic kidney disease, unspecified: Principal | ICD-10-CM

## 2016-11-19 DIAGNOSIS — D631 Anemia in chronic kidney disease: Secondary | ICD-10-CM

## 2016-11-19 NOTE — Telephone Encounter (Signed)
-----  Message from Ladell Pier, MD sent at 11/18/2016  8:21 PM EDT ----- Please call patient, iron levels are lower, increase ferrous sulfate to bid if she can tolerate it, will need to consider repeat iv iron next few months Repeat ferritin and iron/tibc 2 months

## 2016-11-19 NOTE — Telephone Encounter (Signed)
Called pt & informed of low iron levels & encouraged to try to increase ferrous sulfate to bid.  She reports that she will try but has been taking only once per day due to it causing diarrhea.  Discussed iron rich foods & she will also try to research more on that.  LOS for lab in 2 months per Dr Gearldine Shown instructions.

## 2016-12-08 ENCOUNTER — Ambulatory Visit (INDEPENDENT_AMBULATORY_CARE_PROVIDER_SITE_OTHER): Payer: Medicare Other

## 2016-12-08 DIAGNOSIS — M810 Age-related osteoporosis without current pathological fracture: Secondary | ICD-10-CM

## 2016-12-08 MED ORDER — DENOSUMAB 60 MG/ML ~~LOC~~ SOLN
60.0000 mg | Freq: Once | SUBCUTANEOUS | Status: AC
  Administered 2016-12-08: 60 mg via SUBCUTANEOUS

## 2016-12-15 ENCOUNTER — Other Ambulatory Visit (HOSPITAL_BASED_OUTPATIENT_CLINIC_OR_DEPARTMENT_OTHER): Payer: Medicare Other

## 2016-12-15 ENCOUNTER — Ambulatory Visit: Payer: Medicare Other

## 2016-12-15 ENCOUNTER — Ambulatory Visit (HOSPITAL_BASED_OUTPATIENT_CLINIC_OR_DEPARTMENT_OTHER): Payer: Medicare Other

## 2016-12-15 ENCOUNTER — Other Ambulatory Visit: Payer: Medicare Other

## 2016-12-15 VITALS — BP 153/88 | HR 59 | Temp 98.0°F | Resp 20

## 2016-12-15 DIAGNOSIS — D631 Anemia in chronic kidney disease: Secondary | ICD-10-CM

## 2016-12-15 DIAGNOSIS — N189 Chronic kidney disease, unspecified: Secondary | ICD-10-CM | POA: Diagnosis not present

## 2016-12-15 DIAGNOSIS — N183 Chronic kidney disease, stage 3 unspecified: Secondary | ICD-10-CM

## 2016-12-15 LAB — CBC WITH DIFFERENTIAL/PLATELET
BASO%: 0.1 % (ref 0.0–2.0)
Basophils Absolute: 0 10*3/uL (ref 0.0–0.1)
EOS%: 0.2 % (ref 0.0–7.0)
Eosinophils Absolute: 0 10*3/uL (ref 0.0–0.5)
HCT: 33.4 % — ABNORMAL LOW (ref 34.8–46.6)
HGB: 10.7 g/dL — ABNORMAL LOW (ref 11.6–15.9)
LYMPH%: 11.2 % — ABNORMAL LOW (ref 14.0–49.7)
MCH: 30.1 pg (ref 25.1–34.0)
MCHC: 32 g/dL (ref 31.5–36.0)
MCV: 94.1 fL (ref 79.5–101.0)
MONO#: 0.6 10*3/uL (ref 0.1–0.9)
MONO%: 7.7 % (ref 0.0–14.0)
NEUT#: 6.6 10*3/uL — ABNORMAL HIGH (ref 1.5–6.5)
NEUT%: 80.8 % — ABNORMAL HIGH (ref 38.4–76.8)
Platelets: 162 10*3/uL (ref 145–400)
RBC: 3.55 10*6/uL — ABNORMAL LOW (ref 3.70–5.45)
RDW: 13.2 % (ref 11.2–14.5)
WBC: 8.2 10*3/uL (ref 3.9–10.3)
lymph#: 0.9 10*3/uL (ref 0.9–3.3)
nRBC: 0 % (ref 0–0)

## 2016-12-15 MED ORDER — DARBEPOETIN ALFA 100 MCG/0.5ML IJ SOSY
100.0000 ug | PREFILLED_SYRINGE | Freq: Once | INTRAMUSCULAR | Status: AC
  Administered 2016-12-15: 100 ug via SUBCUTANEOUS
  Filled 2016-12-15: qty 0.5

## 2016-12-15 NOTE — Patient Instructions (Signed)

## 2016-12-17 ENCOUNTER — Telehealth: Payer: Self-pay | Admitting: Cardiology

## 2016-12-17 ENCOUNTER — Ambulatory Visit (INDEPENDENT_AMBULATORY_CARE_PROVIDER_SITE_OTHER): Payer: Medicare Other | Admitting: *Deleted

## 2016-12-17 DIAGNOSIS — I442 Atrioventricular block, complete: Secondary | ICD-10-CM | POA: Diagnosis not present

## 2016-12-17 NOTE — Progress Notes (Signed)
Remote pacemaker transmission.   

## 2016-12-17 NOTE — Telephone Encounter (Signed)
LMOVM reminding pt to send remote transmission.

## 2016-12-18 ENCOUNTER — Encounter: Payer: Self-pay | Admitting: Cardiology

## 2017-01-04 ENCOUNTER — Encounter (HOSPITAL_COMMUNITY): Payer: Self-pay | Admitting: Orthopedic Surgery

## 2017-01-04 NOTE — Addendum Note (Signed)
Addendum  created 01/04/17 1628 by Annye Asa, MD   Delete clinical note, Sign clinical note

## 2017-01-04 NOTE — Anesthesia Postprocedure Evaluation (Deleted)
Anesthesia Post Note  Patient: Tricia Gonzales  Procedure(s) Performed: Procedure(s) (LRB): EXCISION CYST (Right) DEBRIDEMENT PIP RIGHT RING FINGER (Right)     Anesthesia Post Evaluation  Last Vitals:  Vitals:   08/06/16 0945 08/06/16 1000  BP: (!) 153/65   Pulse: (!) 59 (!) 59  Resp: 19 16  Temp:  36.3 C    Last Pain:  Vitals:   08/06/16 0649  TempSrc: Oral                 Lando Alcalde,E. Jaiah Weigel

## 2017-01-04 NOTE — Addendum Note (Signed)
Addendum  created 01/04/17 1535 by Annye Asa, MD   Sign clinical note

## 2017-01-12 ENCOUNTER — Other Ambulatory Visit: Payer: Self-pay

## 2017-01-12 ENCOUNTER — Ambulatory Visit: Payer: Medicare Other

## 2017-01-12 ENCOUNTER — Other Ambulatory Visit: Payer: Medicare Other

## 2017-01-12 ENCOUNTER — Ambulatory Visit (HOSPITAL_BASED_OUTPATIENT_CLINIC_OR_DEPARTMENT_OTHER): Payer: Medicare Other

## 2017-01-12 ENCOUNTER — Other Ambulatory Visit (HOSPITAL_BASED_OUTPATIENT_CLINIC_OR_DEPARTMENT_OTHER): Payer: Medicare Other

## 2017-01-12 ENCOUNTER — Telehealth: Payer: Self-pay | Admitting: Oncology

## 2017-01-12 ENCOUNTER — Ambulatory Visit (HOSPITAL_BASED_OUTPATIENT_CLINIC_OR_DEPARTMENT_OTHER): Payer: Medicare Other | Admitting: Nurse Practitioner

## 2017-01-12 ENCOUNTER — Ambulatory Visit: Payer: Medicare Other | Admitting: Nurse Practitioner

## 2017-01-12 VITALS — BP 161/56 | HR 60 | Temp 97.5°F | Resp 18 | Ht <= 58 in | Wt 257.1 lb

## 2017-01-12 VITALS — BP 158/61

## 2017-01-12 DIAGNOSIS — N189 Chronic kidney disease, unspecified: Secondary | ICD-10-CM

## 2017-01-12 DIAGNOSIS — D631 Anemia in chronic kidney disease: Secondary | ICD-10-CM | POA: Diagnosis not present

## 2017-01-12 DIAGNOSIS — M1990 Unspecified osteoarthritis, unspecified site: Secondary | ICD-10-CM

## 2017-01-12 DIAGNOSIS — E119 Type 2 diabetes mellitus without complications: Secondary | ICD-10-CM

## 2017-01-12 DIAGNOSIS — I4891 Unspecified atrial fibrillation: Secondary | ICD-10-CM | POA: Diagnosis not present

## 2017-01-12 DIAGNOSIS — M3500 Sicca syndrome, unspecified: Secondary | ICD-10-CM

## 2017-01-12 DIAGNOSIS — N183 Chronic kidney disease, stage 3 unspecified: Secondary | ICD-10-CM

## 2017-01-12 LAB — CUP PACEART REMOTE DEVICE CHECK
Battery Remaining Longevity: 96 mo
Battery Voltage: 3.01 V
Brady Statistic AP VP Percent: 62.23 %
Brady Statistic AP VS Percent: 3.61 %
Brady Statistic AS VP Percent: 6.93 %
Brady Statistic AS VS Percent: 27.22 %
Brady Statistic RA Percent Paced: 65.8 %
Brady Statistic RV Percent Paced: 69.26 %
Date Time Interrogation Session: 20180628185351
Implantable Lead Implant Date: 20161227
Implantable Lead Implant Date: 20161227
Implantable Lead Location: 753859
Implantable Lead Location: 753860
Implantable Lead Model: 5076
Implantable Lead Model: 5076
Implantable Pulse Generator Implant Date: 20161227
Lead Channel Impedance Value: 342 Ohm
Lead Channel Impedance Value: 361 Ohm
Lead Channel Impedance Value: 399 Ohm
Lead Channel Impedance Value: 437 Ohm
Lead Channel Pacing Threshold Amplitude: 0.875 V
Lead Channel Pacing Threshold Amplitude: 0.875 V
Lead Channel Pacing Threshold Pulse Width: 0.4 ms
Lead Channel Pacing Threshold Pulse Width: 0.4 ms
Lead Channel Sensing Intrinsic Amplitude: 12.375 mV
Lead Channel Sensing Intrinsic Amplitude: 12.375 mV
Lead Channel Sensing Intrinsic Amplitude: 2.375 mV
Lead Channel Sensing Intrinsic Amplitude: 2.375 mV
Lead Channel Setting Pacing Amplitude: 2 V
Lead Channel Setting Pacing Amplitude: 2.5 V
Lead Channel Setting Pacing Pulse Width: 0.4 ms
Lead Channel Setting Sensing Sensitivity: 2.8 mV

## 2017-01-12 LAB — CBC WITH DIFFERENTIAL/PLATELET
BASO%: 0.3 % (ref 0.0–2.0)
Basophils Absolute: 0 10*3/uL (ref 0.0–0.1)
EOS%: 0.4 % (ref 0.0–7.0)
Eosinophils Absolute: 0 10*3/uL (ref 0.0–0.5)
HCT: 32.8 % — ABNORMAL LOW (ref 34.8–46.6)
HGB: 10.7 g/dL — ABNORMAL LOW (ref 11.6–15.9)
LYMPH%: 10.9 % — ABNORMAL LOW (ref 14.0–49.7)
MCH: 29.5 pg (ref 25.1–34.0)
MCHC: 32.7 g/dL (ref 31.5–36.0)
MCV: 90.2 fL (ref 79.5–101.0)
MONO#: 0.5 10*3/uL (ref 0.1–0.9)
MONO%: 5.1 % (ref 0.0–14.0)
NEUT#: 7.4 10*3/uL — ABNORMAL HIGH (ref 1.5–6.5)
NEUT%: 83.3 % — ABNORMAL HIGH (ref 38.4–76.8)
Platelets: 260 10*3/uL (ref 145–400)
RBC: 3.64 10*6/uL — ABNORMAL LOW (ref 3.70–5.45)
RDW: 13.5 % (ref 11.2–14.5)
WBC: 8.9 10*3/uL (ref 3.9–10.3)
lymph#: 1 10*3/uL (ref 0.9–3.3)

## 2017-01-12 LAB — IRON AND TIBC
%SAT: 18 % — ABNORMAL LOW (ref 21–57)
Iron: 59 ug/dL (ref 41–142)
TIBC: 326 ug/dL (ref 236–444)
UIBC: 267 ug/dL (ref 120–384)

## 2017-01-12 LAB — FERRITIN: Ferritin: 46 ng/ml (ref 9–269)

## 2017-01-12 MED ORDER — DARBEPOETIN ALFA 100 MCG/0.5ML IJ SOSY
100.0000 ug | PREFILLED_SYRINGE | Freq: Once | INTRAMUSCULAR | Status: AC
  Administered 2017-01-12: 100 ug via SUBCUTANEOUS
  Filled 2017-01-12: qty 0.5

## 2017-01-12 NOTE — Patient Instructions (Signed)
Darbepoetin Alfa injection What is this medicine? DARBEPOETIN ALFA (dar be POE e tin AL fa) helps your body make more red blood cells. It is used to treat anemia caused by chronic kidney failure and chemotherapy. This medicine may be used for other purposes; ask your health care provider or pharmacist if you have questions. What should I tell my health care provider before I take this medicine? They need to know if you have any of these conditions: -blood clotting disorders or history of blood clots -cancer patient not on chemotherapy -cystic fibrosis -heart disease, such as angina, heart failure, or a history of a heart attack -hemoglobin level of 12 g/dL or greater -high blood pressure -low levels of folate, iron, or vitamin B12 -seizures -an unusual or allergic reaction to darbepoetin, erythropoietin, albumin, hamster proteins, latex, other medicines, foods, dyes, or preservatives -pregnant or trying to get pregnant -breast-feeding How should I use this medicine? This medicine is for injection into a vein or under the skin. It is usually given by a health care professional in a hospital or clinic setting. If you get this medicine at home, you will be taught how to prepare and give this medicine. Do not shake the solution before you withdraw a dose. Use exactly as directed. Take your medicine at regular intervals. Do not take your medicine more often than directed. It is important that you put your used needles and syringes in a special sharps container. Do not put them in a trash can. If you do not have a sharps container, call your pharmacist or healthcare provider to get one. Talk to your pediatrician regarding the use of this medicine in children. While this medicine may be used in children as young as 1 year for selected conditions, precautions do apply. Overdosage: If you think you have taken too much of this medicine contact a poison control center or emergency room at once. NOTE:  This medicine is only for you. Do not share this medicine with others. What if I miss a dose? If you miss a dose, take it as soon as you can. If it is almost time for your next dose, take only that dose. Do not take double or extra doses. What may interact with this medicine? Do not take this medicine with any of the following medications: -epoetin alfa This list may not describe all possible interactions. Give your health care provider a list of all the medicines, herbs, non-prescription drugs, or dietary supplements you use. Also tell them if you smoke, drink alcohol, or use illegal drugs. Some items may interact with your medicine. What should I watch for while using this medicine? Visit your prescriber or health care professional for regular checks on your progress and for the needed blood tests and blood pressure measurements. It is especially important for the doctor to make sure your hemoglobin level is in the desired range, to limit the risk of potential side effects and to give you the best benefit. Keep all appointments for any recommended tests. Check your blood pressure as directed. Ask your doctor what your blood pressure should be and when you should contact him or her. As your body makes more red blood cells, you may need to take iron, folic acid, or vitamin B supplements. Ask your doctor or health care provider which products are right for you. If you have kidney disease continue dietary restrictions, even though this medication can make you feel better. Talk with your doctor or health care professional about the   foods you eat and the vitamins that you take. What side effects may I notice from receiving this medicine? Side effects that you should report to your doctor or health care professional as soon as possible: -allergic reactions like skin rash, itching or hives, swelling of the face, lips, or tongue -breathing problems -changes in vision -chest pain -confusion, trouble speaking  or understanding -feeling faint or lightheaded, falls -high blood pressure -muscle aches or pains -pain, swelling, warmth in the leg -rapid weight gain -severe headaches -sudden numbness or weakness of the face, arm or leg -trouble walking, dizziness, loss of balance or coordination -seizures (convulsions) -swelling of the ankles, feet, hands -unusually weak or tired Side effects that usually do not require medical attention (report to your doctor or health care professional if they continue or are bothersome): -diarrhea -fever, chills (flu-like symptoms) -headaches -nausea, vomiting -redness, stinging, or swelling at site where injected This list may not describe all possible side effects. Call your doctor for medical advice about side effects. You may report side effects to FDA at 1-800-FDA-1088. Where should I keep my medicine? Keep out of the reach of children. Store in a refrigerator between 2 and 8 degrees C (36 and 46 degrees F). Do not freeze. Do not shake. Throw away any unused portion if using a single-dose vial. Throw away any unused medicine after the expiration date. NOTE: This sheet is a summary. It may not cover all possible information. If you have questions about this medicine, talk to your doctor, pharmacist, or health care provider.    2016, Elsevier/[employer]. (2008-05-22 10:23:57)

## 2017-01-12 NOTE — Progress Notes (Signed)
  [facility] OFFICE PROGRESS NOTE   Diagnosis:  [place] a, renal insufficiency  INTERVAL HISTORY:   Tricia Gonzales returns as scheduled. She receives Aranesp on a monthly schedule. She continues to iron tablets a day. She notes the iron causes loose stools.  Objective:  Vital signs in last 24 hours:  Blood pressure (!) 176/51, pulse 60, temperature (!) 97.5 F (36.4 C), temperature source Oral, resp. rate 18, height _0  (1.473 m), weight 257 lb 1.6 oz (116.6 kg), SpO2 98 %.   Resp: Lungs clear bilaterally. Cardio: Regular rate and rhythm. GI: Abdomen soft and nontender. Vascular: Trace lower leg edema bilaterally.    Lab Results:  Lab Results  Component Value Date   WBC 8.2 12/15/2016   HGB 10.7 (L) 12/15/2016   HCT 33.4 (L) 12/15/2016   MCV 94.1 12/15/2016   PLT 162 12/15/2016   NEUTROABS 6.6 (H) 12/15/2016    Imaging:  No results found.  Medications: I have reviewed the patient's current medications.  Assessment/Plan: 1. 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; quantitative immunoglobulins show IgG mildly decreased 654 (range [phone]), IgA and IgM in normal range; polyclonal serum light chain elevation  11/15/2015-hemoglobin 11.2  11/29/2015-hemoglobin 11.1  12/12/2005-hemoglobin 10.4; Aranesp 100 g every 2 weeks initiated  02/07/2016-hemoglobin 11.0, Aranesp held, changed to an every 3 week schedule  Progressive anemia and decreased iron stores December 2017, treated with IV iron January 2018 with  improvement  2. Chronic renal insufficiency  3. Atrial fibrillation-maintained on apixaban  4. Osteoarthritis  5. Diabetes  6. Sjogren's disease  7. Pericarditis February 2017   Disposition: Tricia Gonzales remains stable from a hematologic standpoint. Plan to continue Aranesp every 4 weeks. We will follow-up on the iron studies from today. We will see her in follow-up in 4 months. She will contact the office in the interim with any problems.    Ned Card ANP/GNP-BC   01/12/2017  2:35 PM

## 2017-01-12 NOTE — Telephone Encounter (Signed)
Gave patient avs report and appointments for August thru July.

## 2017-01-13 ENCOUNTER — Telehealth: Payer: Self-pay | Admitting: *Deleted

## 2017-01-13 NOTE — Telephone Encounter (Signed)
-----  Message from Ladell Pier, MD sent at 01/12/2017  8:34 PM EDT ----- Please call patient, iron level is low, increase iron to tid, repeat ferritin and iron/tibc 3-6 weeks with procrit injection

## 2017-01-13 NOTE — Telephone Encounter (Signed)
Called pt with instructions to increase iron to TID. She reports current BID dosing is causing 6-8 loose stools per day. She doesn't think she can tolerate TID. Will review with MD.

## 2017-01-15 ENCOUNTER — Other Ambulatory Visit: Payer: Self-pay | Admitting: Internal Medicine

## 2017-01-15 DIAGNOSIS — M810 Age-related osteoporosis without current pathological fracture: Secondary | ICD-10-CM

## 2017-01-21 ENCOUNTER — Other Ambulatory Visit: Payer: Self-pay | Admitting: Oncology

## 2017-01-21 NOTE — Telephone Encounter (Signed)
Left message for pt to call office. Dr. Benay Spice recommends IV iron.

## 2017-01-21 NOTE — Telephone Encounter (Signed)
Scheduled ferraheme

## 2017-02-01 NOTE — Telephone Encounter (Signed)
Informed pt of MD recommendation for IV iron. She has cataract surgery scheduled for early Sept. Would like to wait until late Sept/ early Oct to take IV iron. Message to schedulers to contact pt with appt.

## 2017-02-02 ENCOUNTER — Telehealth: Payer: Self-pay | Admitting: Oncology

## 2017-02-02 NOTE — Telephone Encounter (Signed)
Scheduled appt per 8/13 sch message- patient is aware of appt added.

## 2017-02-09 ENCOUNTER — Ambulatory Visit (HOSPITAL_BASED_OUTPATIENT_CLINIC_OR_DEPARTMENT_OTHER): Payer: Medicare Other

## 2017-02-09 ENCOUNTER — Other Ambulatory Visit (HOSPITAL_BASED_OUTPATIENT_CLINIC_OR_DEPARTMENT_OTHER): Payer: Medicare Other

## 2017-02-09 VITALS — BP 156/82 | HR 73 | Temp 98.0°F | Resp 18

## 2017-02-09 DIAGNOSIS — D631 Anemia in chronic kidney disease: Secondary | ICD-10-CM | POA: Diagnosis not present

## 2017-02-09 DIAGNOSIS — N189 Chronic kidney disease, unspecified: Secondary | ICD-10-CM | POA: Diagnosis not present

## 2017-02-09 DIAGNOSIS — N183 Chronic kidney disease, stage 3 unspecified: Secondary | ICD-10-CM

## 2017-02-09 LAB — CBC WITH DIFFERENTIAL/PLATELET
BASO%: 0.3 % (ref 0.0–2.0)
Basophils Absolute: 0 10*3/uL (ref 0.0–0.1)
EOS%: 0.6 % (ref 0.0–7.0)
Eosinophils Absolute: 0 10*3/uL (ref 0.0–0.5)
HCT: 30.8 % — ABNORMAL LOW (ref 34.8–46.6)
HGB: 10.3 g/dL — ABNORMAL LOW (ref 11.6–15.9)
LYMPH%: 11.6 % — ABNORMAL LOW (ref 14.0–49.7)
MCH: 30.1 pg (ref 25.1–34.0)
MCHC: 33.5 g/dL (ref 31.5–36.0)
MCV: 89.9 fL (ref 79.5–101.0)
MONO#: 0.5 10*3/uL (ref 0.1–0.9)
MONO%: 6.5 % (ref 0.0–14.0)
NEUT#: 6.3 10*3/uL (ref 1.5–6.5)
NEUT%: 81 % — ABNORMAL HIGH (ref 38.4–76.8)
Platelets: 264 10*3/uL (ref 145–400)
RBC: 3.42 10*6/uL — ABNORMAL LOW (ref 3.70–5.45)
RDW: 14.4 % (ref 11.2–14.5)
WBC: 7.7 10*3/uL (ref 3.9–10.3)
lymph#: 0.9 10*3/uL (ref 0.9–3.3)

## 2017-02-09 MED ORDER — DARBEPOETIN ALFA 100 MCG/0.5ML IJ SOSY
100.0000 ug | PREFILLED_SYRINGE | Freq: Once | INTRAMUSCULAR | Status: AC
  Administered 2017-02-09: 100 ug via SUBCUTANEOUS
  Filled 2017-02-09: qty 0.5

## 2017-02-09 NOTE — Patient Instructions (Signed)
Darbepoetin Alfa injection What is this medicine? DARBEPOETIN ALFA (dar be POE e tin AL fa) helps your body make more red blood cells. It is used to treat anemia caused by chronic kidney failure and chemotherapy. This medicine may be used for other purposes; ask your health care provider or pharmacist if you have questions. What should I tell my health care provider before I take this medicine? They need to know if you have any of these conditions: -blood clotting disorders or history of blood clots -cancer patient not on chemotherapy -cystic fibrosis -heart disease, such as angina, heart failure, or a history of a heart attack -hemoglobin level of 12 g/dL or greater -high blood pressure -low levels of folate, iron, or vitamin B12 -seizures -an unusual or allergic reaction to darbepoetin, erythropoietin, albumin, hamster proteins, latex, other medicines, foods, dyes, or preservatives -pregnant or trying to get pregnant -breast-feeding How should I use this medicine? This medicine is for injection into a vein or under the skin. It is usually given by a health care professional in a hospital or clinic setting. If you get this medicine at home, you will be taught how to prepare and give this medicine. Do not shake the solution before you withdraw a dose. Use exactly as directed. Take your medicine at regular intervals. Do not take your medicine more often than directed. It is important that you put your used needles and syringes in a special sharps container. Do not put them in a trash can. If you do not have a sharps container, call your pharmacist or healthcare provider to get one. Talk to your pediatrician regarding the use of this medicine in children. While this medicine may be used in children as young as 1 year for selected conditions, precautions do apply. Overdosage: If you think you have taken too much of this medicine contact a poison control center or emergency room at once. NOTE:  This medicine is only for you. Do not share this medicine with others. What if I miss a dose? If you miss a dose, take it as soon as you can. If it is almost time for your next dose, take only that dose. Do not take double or extra doses. What may interact with this medicine? Do not take this medicine with any of the following medications: -epoetin alfa This list may not describe all possible interactions. Give your health care provider a list of all the medicines, herbs, non-prescription drugs, or dietary supplements you use. Also tell them if you smoke, drink alcohol, or use illegal drugs. Some items may interact with your medicine. What should I watch for while using this medicine? Visit your prescriber or health care professional for regular checks on your progress and for the needed blood tests and blood pressure measurements. It is especially important for the doctor to make sure your hemoglobin level is in the desired range, to limit the risk of potential side effects and to give you the best benefit. Keep all appointments for any recommended tests. Check your blood pressure as directed. Ask your doctor what your blood pressure should be and when you should contact him or her. As your body makes more red blood cells, you may need to take iron, folic acid, or vitamin B supplements. Ask your doctor or health care provider which products are right for you. If you have kidney disease continue dietary restrictions, even though this medication can make you feel better. Talk with your doctor or health care professional about the   foods you eat and the vitamins that you take. What side effects may I notice from receiving this medicine? Side effects that you should report to your doctor or health care professional as soon as possible: -allergic reactions like skin rash, itching or hives, swelling of the face, lips, or tongue -breathing problems -changes in vision -chest pain -confusion, trouble speaking  or understanding -feeling faint or lightheaded, falls -high blood pressure -muscle aches or pains -pain, swelling, warmth in the leg -rapid weight gain -severe headaches -sudden numbness or weakness of the face, arm or leg -trouble walking, dizziness, loss of balance or coordination -seizures (convulsions) -swelling of the ankles, feet, hands -unusually weak or tired Side effects that usually do not require medical attention (report to your doctor or health care professional if they continue or are bothersome): -diarrhea -fever, chills (flu-like symptoms) -headaches -nausea, vomiting -redness, stinging, or swelling at site where injected This list may not describe all possible side effects. Call your doctor for medical advice about side effects. You may report side effects to FDA at 1-800-FDA-1088. Where should I keep my medicine? Keep out of the reach of children. Store in a refrigerator between 2 and 8 degrees C (36 and 46 degrees F). Do not freeze. Do not shake. Throw away any unused portion if using a single-dose vial. Throw away any unused medicine after the expiration date. NOTE: This sheet is a summary. It may not cover all possible information. If you have questions about this medicine, talk to your doctor, pharmacist, or health care provider.    2016, Elsevier/[employer]. (2008-05-22 10:23:57)

## 2017-02-24 ENCOUNTER — Other Ambulatory Visit: Payer: Self-pay | Admitting: Internal Medicine

## 2017-02-24 NOTE — Telephone Encounter (Signed)
Pt last saw [name] 06/18/16, last labs 10/20/16 Creat 1.6, age 76, weight 116.6kg, based on specified criteria pt is on appropriate dosage of Eliquis 77m BID.  Will refill rx.

## 2017-03-08 ENCOUNTER — Encounter: Payer: Self-pay | Admitting: Internal Medicine

## 2017-03-09 ENCOUNTER — Ambulatory Visit (HOSPITAL_BASED_OUTPATIENT_CLINIC_OR_DEPARTMENT_OTHER): Payer: Medicare Other

## 2017-03-09 ENCOUNTER — Other Ambulatory Visit (HOSPITAL_BASED_OUTPATIENT_CLINIC_OR_DEPARTMENT_OTHER): Payer: Medicare Other

## 2017-03-09 VITALS — BP 146/52 | HR 68 | Temp 98.0°F | Resp 18

## 2017-03-09 DIAGNOSIS — N189 Chronic kidney disease, unspecified: Secondary | ICD-10-CM | POA: Diagnosis not present

## 2017-03-09 DIAGNOSIS — N183 Chronic kidney disease, stage 3 unspecified: Secondary | ICD-10-CM

## 2017-03-09 DIAGNOSIS — D631 Anemia in chronic kidney disease: Secondary | ICD-10-CM

## 2017-03-09 LAB — CBC WITH DIFFERENTIAL/PLATELET
BASO%: 0.2 % (ref 0.0–2.0)
Basophils Absolute: 0 10*3/uL (ref 0.0–0.1)
EOS%: 0.4 % (ref 0.0–7.0)
Eosinophils Absolute: 0 10*3/uL (ref 0.0–0.5)
HCT: 31.4 % — ABNORMAL LOW (ref 34.8–46.6)
HGB: 10.6 g/dL — ABNORMAL LOW (ref 11.6–15.9)
LYMPH%: 8.4 % — ABNORMAL LOW (ref 14.0–49.7)
MCH: 30.3 pg (ref 25.1–34.0)
MCHC: 33.7 g/dL (ref 31.5–36.0)
MCV: 89.9 fL (ref 79.5–101.0)
MONO#: 0.5 10*3/uL (ref 0.1–0.9)
MONO%: 5.5 % (ref 0.0–14.0)
NEUT#: 8 10*3/uL — ABNORMAL HIGH (ref 1.5–6.5)
NEUT%: 85.5 % — ABNORMAL HIGH (ref 38.4–76.8)
Platelets: 259 10*3/uL (ref 145–400)
RBC: 3.49 10*6/uL — ABNORMAL LOW (ref 3.70–5.45)
RDW: 14.4 % (ref 11.2–14.5)
WBC: 9.3 10*3/uL (ref 3.9–10.3)
lymph#: 0.8 10*3/uL — ABNORMAL LOW (ref 0.9–3.3)

## 2017-03-09 LAB — IRON AND TIBC
%SAT: 16 % — ABNORMAL LOW (ref 21–57)
Iron: 56 ug/dL (ref 41–142)
TIBC: 339 ug/dL (ref 236–444)
UIBC: 283 ug/dL (ref 120–384)

## 2017-03-09 LAB — FERRITIN: Ferritin: 53 ng/ml (ref 9–269)

## 2017-03-09 MED ORDER — DARBEPOETIN ALFA 100 MCG/0.5ML IJ SOSY
100.0000 ug | PREFILLED_SYRINGE | Freq: Once | INTRAMUSCULAR | Status: AC
  Administered 2017-03-09: 100 ug via SUBCUTANEOUS
  Filled 2017-03-09: qty 0.5

## 2017-03-12 ENCOUNTER — Telehealth: Payer: Self-pay | Admitting: *Deleted

## 2017-03-12 NOTE — Telephone Encounter (Signed)
Notified pt, per Dr. Benay Spice: HGB is stable despite low iron saturation. Cancel IV iron, continue ferrous sulfate BID. Pt voiced appreciation for call.

## 2017-03-16 ENCOUNTER — Ambulatory Visit: Payer: Medicare Other

## 2017-03-18 ENCOUNTER — Ambulatory Visit (INDEPENDENT_AMBULATORY_CARE_PROVIDER_SITE_OTHER): Payer: Medicare Other | Admitting: *Deleted

## 2017-03-18 ENCOUNTER — Telehealth: Payer: Self-pay | Admitting: Cardiology

## 2017-03-18 DIAGNOSIS — I442 Atrioventricular block, complete: Secondary | ICD-10-CM

## 2017-03-18 NOTE — Telephone Encounter (Signed)
Spoke with pt and reminded pt of remote transmission that is due today. Pt verbalized understanding.   

## 2017-03-19 NOTE — Progress Notes (Signed)
Remote pacemaker transmission.   

## 2017-03-22 ENCOUNTER — Telehealth: Payer: Self-pay | Admitting: Internal Medicine

## 2017-03-22 NOTE — Telephone Encounter (Signed)
Patient requesting a call before 12pm tomorrow on if her bone density lab results have been received. Patient has an appointment tomorrow at 3pm. Call to advise.

## 2017-03-23 ENCOUNTER — Encounter: Payer: Self-pay | Admitting: Internal Medicine

## 2017-03-23 ENCOUNTER — Encounter: Payer: Self-pay | Admitting: Cardiology

## 2017-03-23 ENCOUNTER — Ambulatory Visit (INDEPENDENT_AMBULATORY_CARE_PROVIDER_SITE_OTHER): Payer: Medicare Other | Admitting: Internal Medicine

## 2017-03-23 VITALS — BP 130/80 | HR 80 | Ht 58.5 in | Wt 261.0 lb

## 2017-03-23 DIAGNOSIS — M810 Age-related osteoporosis without current pathological fracture: Secondary | ICD-10-CM | POA: Diagnosis not present

## 2017-03-23 LAB — VITAMIN D 25 HYDROXY (VIT D DEFICIENCY, FRACTURES): VITD: 59.65 ng/mL (ref 30.00–100.00)

## 2017-03-23 NOTE — Telephone Encounter (Signed)
I do not have the results.Marland KitchenMarland Kitchen

## 2017-03-23 NOTE — Patient Instructions (Signed)
Please stop at the lab.  Please continue Prolia.  Please return in 1 year.

## 2017-03-23 NOTE — Progress Notes (Addendum)
Patient ID: Tricia Gonzales, female   DOB: 06-Jul-1940, 76 y.o.   MRN: [id number]   HPI  Tricia Gonzales is a 76 y.o.-year-old female, returning for f/u for osteoporosis. Last visit one year ago.  Reviewed and addended hx: Pt was dx with osteopenia several years ago, then OP in 12/2014.  She denies fractures or falls. No dizziness/vertigo/orthostasis. Had 3 TKR surgeries.  I reviewed pt's DEXA scans: Date L1-L3 T score FN T score  03/08/2017 -0.4 (+5.3%*) RFN: -2.7 (+48.0%*) LFN: -2.5 (+ 16.1%*)  01/15/2015 -0.9 RFN: -4.3 LFN: -3.2   She has been on the following OP treatments:  - bisphosphonates (Fosamax, Actonel) x 2 years >> stopped 2010  We started Prolia and she has had 4 injections so far. She is tolerating it very well.  She has GERD >> on Protonix. Also, h/o esophageal stricture.  She has a h/o vitamin D deficiency in the past >> was on Ergocalciferol 50,000 IU  In 2006 years ago (Dr Rockwell [place] - rheumatology).  Pt takes calcium and vitamin D (1 MVI daily) - stopped extra vit D in 06/2014 She also eats dairy and green, leafy, vegetables.   Last vit D level was normal: Lab Results  Component Value Date   VD25OH 42.06 03/23/2016   VD25OH 30.93 03/22/2015   No weight bearing exercises. She actually is not very active b/c back and knee pain.  She has a h/o kidney stone x1 in her 60s.   She does not take high vitamin A doses.  She was on chronic glucocorticoid tx for Lipodermatosclerosis (Dr Ubaldo Glassing) >> now off. Also, was getting injections for Low back pain, shoulder, knee.  SPEP/UPEP normal.  No h/o hypercalcemia, but had hypocalcemia in 07/2015. No h/o hyperparathyroidism.  Lab Results  Component Value Date   CALCIUM 9.4 10/20/2016   CALCIUM 9.4 08/28/2015   CALCIUM 9.2 08/26/2015   CALCIUM 9.8 08/22/2015   CALCIUM 9.0 08/13/2015   CALCIUM 8.3 (L) 08/13/2015   CALCIUM 8.3 (L) 08/12/2015   CALCIUM 7.9 (L) 08/11/2015   CALCIUM 7.7 (L) 08/10/2015   CALCIUM 7.5 (L)  08/09/2015   No h/o thyrotoxicosis. Reviewed TSH recent levels:  Lab Results  Component Value Date   TSH 3.10 03/22/2015   TSH 6.035 (H) 09/15/2011   She hasstage 3 CKD. She sees Dr Clover Mealy. Lab Results  Component Value Date   BUN 57.3 (H) 10/20/2016   CREATININE 1.6 (H) 10/20/2016   Menopause was at 76 y/o.   Pt does not have a known FH of osteoporosis, but her mother had a hip fx.  She also has DM2 - On glipizide XL, Januvia, insulin. She also has a history of hypertension and hyperlipidemia.  ROS: Constitutional: + weight gain, + fatigue, +subjective hypothermia Eyes: no blurry vision, no xerophthalmia ENT: no sore throat, no nodules palpated in throat, no dysphagia, no odynophagia, no hoarseness Cardiovascular: no CP/+ SOB/no palpitations/+ leg swelling Respiratory: + cough/+ SOB/no wheezing Gastrointestinal: no N/no V/+ D/+ C/no acid reflux Musculoskeletal: + muscle aches/+ joint aches Skin: no rashes, no hair loss Neurological: no tremors/no numbness/no tingling/no dizziness, + HA  I reviewed pt's medications, allergies, PMH, social hx, family hx, and changes were documented in the history of present illness. Otherwise, unchanged from my initial visit note.  Past Medical History:  Diagnosis Date  . Anemia   . 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 Anxiety   . Arthritis   . Atrial fibrillation ([place])   . Chronic back pain   .  Chronic kidney disease (CKD), stage III (moderate) (HCC)   . Depression   . Diverticulosis   . Early cataracts, bilateral   . Esophageal stricture   . Gastritis   . GERD (gastroesophageal reflux disease)   . Heart murmur     " some doctors say that I have one some say that I don"t "  . Hiatal hernia   . History of blood transfusion    "@ least w/1st knee OR"  . History of gout   . Hyperlipidemia   . Hypertension   . Intestinal obstruction ([place])   . Kidney stones   . Obesity   . Osteoarthritis   . Pericarditis   . Presence  of permanent cardiac pacemaker   . Sjogren's disease ([place])   . Type II diabetes mellitus ([place])   . Vitamin B12 deficiency    Past Surgical History:  Procedure Laterality Date  . ABDOMINAL HYSTERECTOMY    . CHOLECYSTECTOMY OPEN    . COLECTOMY     for rectovaginal fistula  . COLONOSCOPY  01/07/2012   Procedure: COLONOSCOPY;  Surgeon: Lafayette Dragon, MD;  Location: [facility];  Service: Endoscopy;  Laterality: N/A;  . EP IMPLANTABLE DEVICE N/A 06/18/2015   Procedure: Pacemaker Implant;  Surgeon: Evans Lance, MD;  Location: [facility];  Service: Cardiovascular;  Laterality: N/A;  . ESOPHAGOGASTRODUODENOSCOPY (EGD) WITH ESOPHAGEAL DILATION    . ESOPHAGOGASTRODUODENOSCOPY (EGD) WITH PROPOFOL N/A 08/12/2015   Procedure: ESOPHAGOGASTRODUODENOSCOPY (EGD) WITH PROPOFOL;  Surgeon: Manus Gunning, MD;  Location: [facility];  Service: Gastroenterology;  Laterality: N/A;  . I&D EXTREMITY Right 08/06/2016   Procedure: DEBRIDEMENT PIP RIGHT RING FINGER;  Surgeon: Daryll Brod, MD;  Location: [facility];  Service: Orthopedics;  Laterality: Right;  . INSERT / REPLACE / REMOVE PACEMAKER    . JOINT REPLACEMENT    . KNEE ARTHROSCOPY Right   . MASS EXCISION Right 08/06/2016   Procedure: EXCISION CYST;  Surgeon: Daryll Brod, MD;  Location: [facility];  Service: Orthopedics;  Laterality: Right;  . REVISION TOTAL KNEE ARTHROPLASTY Left   . TOTAL KNEE ARTHROPLASTY Bilateral    Social History   Social History  . Marital Status: Divorced    Spouse Name: N/A  . Number of Children: 2   Occupational History  . retired   Social History Main Topics  . Smoking status: Former Smoker    Quit date: 06/23/1983  . Smokeless tobacco: Never Used  . Alcohol Use: Yes     Comment: occasionally - wine, once a week, x2 drinks  . Drug Use: No   Current Outpatient Prescriptions on File Prior to Visit  Medication Sig Dispense Refill  . ACCU-CHEK AVIVA PLUS test strip 1 each by Other route daily.     Marland Kitchen  acetaminophen (TYLENOL) 650 MG CR tablet Take 1,300 mg by mouth at bedtime as needed for pain.    . Carboxymethylcellulose Sodium (THERATEARS OP) Apply 1 drop to eye daily as needed (dry eyes).    Marland Kitchen doxazosin (CARDURA) 4 MG tablet Take 4 mg by mouth daily.    Marland Kitchen ELIQUIS 5 MG TABS tablet TAKE ONE TABLET TWICE DAILY 180 tablet 1  . Epoetin Alfa (PROCRIT IJ) Inject 1 Dose as directed every 21 ( twenty-one) days.     . ferrous sulfate 325 (65 FE) MG EC tablet Take 1 tablet (325 mg total) by mouth 2 (two) times daily. (Patient taking differently: Take 325 mg by mouth at bedtime. Once a day) 60 tablet  3  . gabapentin (NEURONTIN) 300 MG capsule Take 300 mg by mouth 2 (two) times daily.     Marland Kitchen HYDROcodone-acetaminophen (NORCO) 5-325 MG tablet Take 1 tablet by mouth every 6 (six) hours as needed for moderate pain. 30 tablet 0  . JANUVIA 50 MG tablet Take 50 mg by mouth daily.     . metoprolol succinate (TOPROL-XL) 25 MG 24 hr tablet Take 1.5 tablets (37.5 mg total) by mouth daily. 45 tablet 7  . montelukast (SINGULAIR) 10 MG tablet Take 10 mg by mouth at bedtime.     . Multiple Vitamin (MULITIVITAMIN WITH MINERALS) TABS Take 1 tablet by mouth daily.    . pantoprazole (PROTONIX) 40 MG tablet Take 1 tablet (40 mg total) by mouth daily. 30 tablet 1  . pentoxifylline (TRENTAL) 400 MG CR tablet Take 400 mg by mouth daily.    . pravastatin (PRAVACHOL) 40 MG tablet Take 40 mg by mouth every evening.     Marland Kitchen TOUJEO SOLOSTAR 300 UNIT/ML SOPN Inject 22 Units into the skin every evening.     Marland Kitchen ULORIC 40 MG tablet Take 40 mg by mouth daily.      No current facility-administered medications on file prior to visit.    Allergies  Allergen Reactions  . Norvasc [Amlodipine Besylate] Shortness Of Breath  . Avapro [Irbesartan]     Headaches  . Glucophage [Metformin Hydrochloride] Other (See Comments)    "increases creatinine"  . Lisinopril     Headache   . Losartan     Headache   . Morphine And Related Other (See  Comments)    "hallucinations"  . Simvastatin Other (See Comments)    Body aches  . Trulicity [Dulaglutide] Other (See Comments)    Severe mood swings   Family History  Problem Relation Age of Onset  . Diabetes Mother   . Diabetes Brother   . Hypertension Sister   . Colon cancer Neg Hx   . Heart attack Neg Hx   . Stroke Neg Hx   Father with lung CA.  PE: BP 130/80   Pulse 80   Ht 4' 10.5" (1.486 m)   Wt 261 lb (118.4 kg)   SpO2 96%   BMI 53.62 kg/m   Wt Readings from Last 3 Encounters:  03/23/17 261 lb (118.4 kg)  01/12/17 257 lb 1.6 oz (116.6 kg)  09/22/16 251 lb 3.2 oz (113.9 kg)   Constitutional: obese, in NAD, + kyphosis, walks with a cane Eyes: PERRLA, EOMI, no exophthalmos ENT: moist mucous membranes, no thyromegaly, no cervical lymphadenopathy Cardiovascular: RRR, No MRG Respiratory: CTA B Gastrointestinal: abdomen soft, NT, ND, BS+ Musculoskeletal: no deformities, strength intact in all 4 Skin: moist, warm, no rashes Neurological: no tremor with outstretched hands, DTR normal in all 4  Assessment: 1. Osteoporosis   Her GFR was low, however we added Prolia in 02/2015, based on the following reference: J Bone Miner Res. 2012 Jul; 27(7): 1505-6979.  Published online 2012 Mar 28. doi: 10.1002/jbmr.J5811397  PMCID: [id number]  A Single-Dose Study of Denosumab in Patients With Various Degrees of Renal Impairment  Plan: 1. Osteoporosis - likely postmenopausal + 2/2 prolonged steroid use - reviewed her last DEXA scan along with the pt >> requested records today from [facility] >> impressive improvement in all T scores! - we should definitely continue Prolia, of which she had 4 doses - we can continue up to 10 years per the latest studies - I also strongly advised her to start some type of  exercise, as tolerated - We will check her vitamin D level today and will need to obtain the latest BMP from PCP - will plan to check another DEXA scan in 2 years - will see pt back  in a year  Orders Placed This Encounter  Procedures  . VITAMIN D 25 Hydroxy (Vit-D Deficiency, Fractures)   Component     Latest Ref Rng & Units 03/23/2017  VITD     30.00 - 100.00 ng/mL 59.65  Excellent vitamin D level.  Addendum 04/05/17 Received records from [facility] >> reviewed original report and images.  Philemon Kingdom, MD PhD [facility]

## 2017-03-23 NOTE — Telephone Encounter (Signed)
Please advise. Thank you!

## 2017-03-24 ENCOUNTER — Telehealth: Payer: Self-pay

## 2017-03-24 NOTE — Telephone Encounter (Signed)
Called to advise patient that we did not have the results on the bone density, but advised of the Vitamin D level.  Patient had no other questions.  

## 2017-03-24 NOTE — Telephone Encounter (Signed)
Called to advise patient that we did not have the results on the bone density, but advised of the Vitamin D level.  Patient had no other questions.

## 2017-03-30 LAB — CUP PACEART REMOTE DEVICE CHECK
Battery Remaining Longevity: 95 mo
Battery Voltage: 3.01 V
Brady Statistic AP VP Percent: 65.3 %
Brady Statistic AP VS Percent: 3.2 %
Brady Statistic AS VP Percent: 9.4 %
Brady Statistic AS VS Percent: 22.1 %
Brady Statistic RA Percent Paced: 68.42 %
Brady Statistic RV Percent Paced: 74.79 %
Date Time Interrogation Session: 20180927175951
Implantable Lead Implant Date: 20161227
Implantable Lead Implant Date: 20161227
Implantable Lead Location: 753859
Implantable Lead Location: 753860
Implantable Lead Model: 5076
Implantable Lead Model: 5076
Implantable Pulse Generator Implant Date: 20161227
Lead Channel Impedance Value: 342 Ohm
Lead Channel Impedance Value: 361 Ohm
Lead Channel Impedance Value: 380 Ohm
Lead Channel Impedance Value: 399 Ohm
Lead Channel Pacing Threshold Amplitude: 0.75 V
Lead Channel Pacing Threshold Amplitude: 1.125 V
Lead Channel Pacing Threshold Pulse Width: 0.4 ms
Lead Channel Pacing Threshold Pulse Width: 0.4 ms
Lead Channel Sensing Intrinsic Amplitude: 11.375 mV
Lead Channel Sensing Intrinsic Amplitude: 11.375 mV
Lead Channel Sensing Intrinsic Amplitude: 2.125 mV
Lead Channel Sensing Intrinsic Amplitude: 2.125 mV
Lead Channel Setting Pacing Amplitude: 2 V
Lead Channel Setting Pacing Amplitude: 2.5 V
Lead Channel Setting Pacing Pulse Width: 0.4 ms
Lead Channel Setting Sensing Sensitivity: 2.8 mV

## 2017-04-06 ENCOUNTER — Other Ambulatory Visit (HOSPITAL_BASED_OUTPATIENT_CLINIC_OR_DEPARTMENT_OTHER): Payer: Medicare Other

## 2017-04-06 ENCOUNTER — Ambulatory Visit (HOSPITAL_BASED_OUTPATIENT_CLINIC_OR_DEPARTMENT_OTHER): Payer: Medicare Other

## 2017-04-06 VITALS — BP 155/68 | HR 67 | Temp 97.8°F | Resp 20

## 2017-04-06 DIAGNOSIS — N183 Chronic kidney disease, stage 3 unspecified: Secondary | ICD-10-CM

## 2017-04-06 DIAGNOSIS — D631 Anemia in chronic kidney disease: Secondary | ICD-10-CM | POA: Diagnosis not present

## 2017-04-06 DIAGNOSIS — N189 Chronic kidney disease, unspecified: Secondary | ICD-10-CM | POA: Diagnosis not present

## 2017-04-06 LAB — CBC WITH DIFFERENTIAL/PLATELET
BASO%: 0.1 % (ref 0.0–2.0)
Basophils Absolute: 0 10*3/uL (ref 0.0–0.1)
EOS%: 0.4 % (ref 0.0–7.0)
Eosinophils Absolute: 0 10*3/uL (ref 0.0–0.5)
HCT: 29.2 % — ABNORMAL LOW (ref 34.8–46.6)
HGB: 9.5 g/dL — ABNORMAL LOW (ref 11.6–15.9)
LYMPH%: 9.7 % — ABNORMAL LOW (ref 14.0–49.7)
MCH: 29.5 pg (ref 25.1–34.0)
MCHC: 32.5 g/dL (ref 31.5–36.0)
MCV: 90.7 fL (ref 79.5–101.0)
MONO#: 0.4 10*3/uL (ref 0.1–0.9)
MONO%: 5.5 % (ref 0.0–14.0)
NEUT#: 6.4 10*3/uL (ref 1.5–6.5)
NEUT%: 84.3 % — ABNORMAL HIGH (ref 38.4–76.8)
Platelets: 253 10*3/uL (ref 145–400)
RBC: 3.22 10*6/uL — ABNORMAL LOW (ref 3.70–5.45)
RDW: 14.5 % (ref 11.2–14.5)
WBC: 7.6 10*3/uL (ref 3.9–10.3)
lymph#: 0.7 10*3/uL — ABNORMAL LOW (ref 0.9–3.3)

## 2017-04-06 MED ORDER — DARBEPOETIN ALFA 100 MCG/0.5ML IJ SOSY
100.0000 ug | PREFILLED_SYRINGE | Freq: Once | INTRAMUSCULAR | Status: AC
  Administered 2017-04-06: 100 ug via SUBCUTANEOUS
  Filled 2017-04-06: qty 0.5

## 2017-04-06 NOTE — Patient Instructions (Signed)
Darbepoetin Alfa injection What is this medicine? DARBEPOETIN ALFA (dar be POE e tin AL fa) helps your body make more red blood cells. It is used to treat anemia caused by chronic kidney failure and chemotherapy. This medicine may be used for other purposes; ask your health care provider or pharmacist if you have questions. What should I tell my health care provider before I take this medicine? They need to know if you have any of these conditions: -blood clotting disorders or history of blood clots -cancer patient not on chemotherapy -cystic fibrosis -heart disease, such as angina, heart failure, or a history of a heart attack -hemoglobin level of 12 g/dL or greater -high blood pressure -low levels of folate, iron, or vitamin B12 -seizures -an unusual or allergic reaction to darbepoetin, erythropoietin, albumin, hamster proteins, latex, other medicines, foods, dyes, or preservatives -pregnant or trying to get pregnant -breast-feeding How should I use this medicine? This medicine is for injection into a vein or under the skin. It is usually given by a health care professional in a hospital or clinic setting. If you get this medicine at home, you will be taught how to prepare and give this medicine. Do not shake the solution before you withdraw a dose. Use exactly as directed. Take your medicine at regular intervals. Do not take your medicine more often than directed. It is important that you put your used needles and syringes in a special sharps container. Do not put them in a trash can. If you do not have a sharps container, call your pharmacist or healthcare provider to get one. Talk to your pediatrician regarding the use of this medicine in children. While this medicine may be used in children as young as 1 year for selected conditions, precautions do apply. Overdosage: If you think you have taken too much of this medicine contact a poison control center or emergency room at once. NOTE:  This medicine is only for you. Do not share this medicine with others. What if I miss a dose? If you miss a dose, take it as soon as you can. If it is almost time for your next dose, take only that dose. Do not take double or extra doses. What may interact with this medicine? Do not take this medicine with any of the following medications: -epoetin alfa This list may not describe all possible interactions. Give your health care provider a list of all the medicines, herbs, non-prescription drugs, or dietary supplements you use. Also tell them if you smoke, drink alcohol, or use illegal drugs. Some items may interact with your medicine. What should I watch for while using this medicine? Visit your prescriber or health care professional for regular checks on your progress and for the needed blood tests and blood pressure measurements. It is especially important for the doctor to make sure your hemoglobin level is in the desired range, to limit the risk of potential side effects and to give you the best benefit. Keep all appointments for any recommended tests. Check your blood pressure as directed. Ask your doctor what your blood pressure should be and when you should contact him or her. As your body makes more red blood cells, you may need to take iron, folic acid, or vitamin B supplements. Ask your doctor or health care provider which products are right for you. If you have kidney disease continue dietary restrictions, even though this medication can make you feel better. Talk with your doctor or health care professional about the   foods you eat and the vitamins that you take. What side effects may I notice from receiving this medicine? Side effects that you should report to your doctor or health care professional as soon as possible: -allergic reactions like skin rash, itching or hives, swelling of the face, lips, or tongue -breathing problems -changes in vision -chest pain -confusion, trouble speaking  or understanding -feeling faint or lightheaded, falls -high blood pressure -muscle aches or pains -pain, swelling, warmth in the leg -rapid weight gain -severe headaches -sudden numbness or weakness of the face, arm or leg -trouble walking, dizziness, loss of balance or coordination -seizures (convulsions) -swelling of the ankles, feet, hands -unusually weak or tired Side effects that usually do not require medical attention (report to your doctor or health care professional if they continue or are bothersome): -diarrhea -fever, chills (flu-like symptoms) -headaches -nausea, vomiting -redness, stinging, or swelling at site where injected This list may not describe all possible side effects. Call your doctor for medical advice about side effects. You may report side effects to FDA at 1-800-FDA-1088. Where should I keep my medicine? Keep out of the reach of children. Store in a refrigerator between 2 and 8 degrees C (36 and 46 degrees F). Do not freeze. Do not shake. Throw away any unused portion if using a single-dose vial. Throw away any unused medicine after the expiration date. NOTE: This sheet is a summary. It may not cover all possible information. If you have questions about this medicine, talk to your doctor, pharmacist, or health care provider.    2016, Elsevier/[employer]. (2008-05-22 10:23:57)

## 2017-05-04 ENCOUNTER — Other Ambulatory Visit (HOSPITAL_BASED_OUTPATIENT_CLINIC_OR_DEPARTMENT_OTHER): Payer: Medicare Other

## 2017-05-04 ENCOUNTER — Ambulatory Visit (HOSPITAL_BASED_OUTPATIENT_CLINIC_OR_DEPARTMENT_OTHER): Payer: Medicare Other | Admitting: Oncology

## 2017-05-04 ENCOUNTER — Ambulatory Visit (HOSPITAL_BASED_OUTPATIENT_CLINIC_OR_DEPARTMENT_OTHER): Payer: Medicare Other

## 2017-05-04 ENCOUNTER — Telehealth: Payer: Self-pay | Admitting: Oncology

## 2017-05-04 VITALS — BP 157/67 | HR 76 | Temp 97.2°F | Resp 18 | Ht 58.5 in | Wt 264.7 lb

## 2017-05-04 DIAGNOSIS — I4891 Unspecified atrial fibrillation: Secondary | ICD-10-CM

## 2017-05-04 DIAGNOSIS — E119 Type 2 diabetes mellitus without complications: Secondary | ICD-10-CM | POA: Diagnosis not present

## 2017-05-04 DIAGNOSIS — D631 Anemia in chronic kidney disease: Secondary | ICD-10-CM

## 2017-05-04 DIAGNOSIS — M1990 Unspecified osteoarthritis, unspecified site: Secondary | ICD-10-CM

## 2017-05-04 DIAGNOSIS — N189 Chronic kidney disease, unspecified: Secondary | ICD-10-CM | POA: Diagnosis not present

## 2017-05-04 DIAGNOSIS — N183 Chronic kidney disease, stage 3 unspecified: Secondary | ICD-10-CM

## 2017-05-04 DIAGNOSIS — M3500 Sicca syndrome, unspecified: Secondary | ICD-10-CM

## 2017-05-04 LAB — CBC WITH DIFFERENTIAL/PLATELET
BASO%: 0.1 % (ref 0.0–2.0)
Basophils Absolute: 0 10*3/uL (ref 0.0–0.1)
EOS%: 0.4 % (ref 0.0–7.0)
Eosinophils Absolute: 0 10*3/uL (ref 0.0–0.5)
HCT: 32 % — ABNORMAL LOW (ref 34.8–46.6)
HGB: 9.9 g/dL — ABNORMAL LOW (ref 11.6–15.9)
LYMPH%: 9.8 % — ABNORMAL LOW (ref 14.0–49.7)
MCH: 29 pg (ref 25.1–34.0)
MCHC: 30.9 g/dL — ABNORMAL LOW (ref 31.5–36.0)
MCV: 93.8 fL (ref 79.5–101.0)
MONO#: 0.6 10*3/uL (ref 0.1–0.9)
MONO%: 6.3 % (ref 0.0–14.0)
NEUT#: 7.7 10*3/uL — ABNORMAL HIGH (ref 1.5–6.5)
NEUT%: 83.4 % — ABNORMAL HIGH (ref 38.4–76.8)
Platelets: 252 10*3/uL (ref 145–400)
RBC: 3.41 10*6/uL — ABNORMAL LOW (ref 3.70–5.45)
RDW: 13.8 % (ref 11.2–14.5)
WBC: 9.2 10*3/uL (ref 3.9–10.3)
lymph#: 0.9 10*3/uL (ref 0.9–3.3)
nRBC: 0 % (ref 0–0)

## 2017-05-04 LAB — IRON AND TIBC
%SAT: 18 % — ABNORMAL LOW (ref 21–57)
Iron: 62 ug/dL (ref 41–142)
TIBC: 339 ug/dL (ref 236–444)
UIBC: 277 ug/dL (ref 120–384)

## 2017-05-04 LAB — FERRITIN: Ferritin: 40 ng/ml (ref 9–269)

## 2017-05-04 MED ORDER — DARBEPOETIN ALFA 100 MCG/0.5ML IJ SOSY
100.0000 ug | PREFILLED_SYRINGE | Freq: Once | INTRAMUSCULAR | Status: AC
  Administered 2017-05-04: 100 ug via SUBCUTANEOUS
  Filled 2017-05-04: qty 0.5

## 2017-05-04 NOTE — Progress Notes (Signed)
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  She is maintained on Aranesp every month.  She is taking iron.  No new complaint.  She would like to increase the length of time between injections.  Objective:  Vital signs in last 24 hours:  Blood pressure (!) 157/67, pulse 76, temperature (!) 97.2 F (36.2 C), temperature source Oral, resp. rate 18, height 4' 10.5" (1.486 m), weight 264 lb 11.2 oz (120.1 kg), SpO2 94 %.    Resp: Inspiratory rhonchi at the lower posterior chest bilaterally, no respiratory distress Cardio: Regular rate and rhythm GI: No hepatosplenomegaly Vascular: Trace edema at the right greater than left lower leg   Lab Results:  Lab Results  Component Value Date   WBC 9.2 05/04/2017   HGB 9.9 (L) 05/04/2017   HCT 32.0 (L) 05/04/2017   MCV 93.8 05/04/2017   PLT 252 05/04/2017   NEUTROABS 7.7 (H) 05/04/2017    CMP     Component Value Date/Time   NA 141 10/20/2016 1321   K 4.9 10/20/2016 1321   CL 103 08/28/2015 1617   CO2 28 10/20/2016 1321   GLUCOSE 179 (H) 10/20/2016 1321   BUN 57.3 (H) 10/20/2016 1321   CREATININE 1.6 (H) 10/20/2016 1321   CALCIUM 9.4 10/20/2016 1321   PROT 6.7 10/20/2016 1321   ALBUMIN 3.5 10/20/2016 1321   AST 15 10/20/2016 1321   ALT 10 10/20/2016 1321   ALKPHOS 58 10/20/2016 1321   BILITOT 0.35 10/20/2016 1321   GFRNONAA 43 (L) 08/13/2015 1211   GFRAA 49 (L) 08/13/2015 1211    No results found for: CEA1  Lab Results  Component Value Date   INR 1.05 09/15/2011    Imaging:  No results found.  Medications: I have reviewed the patient's current medications.  Assessment/Plan: 1. Normocytic anemia-chronic  normal ferritin, serum iron studies-low percent transferrin saturation, normal transferrin  Hemoccult positive stool February 2017, upper endoscopy 08/12/2015 with no source for  bleeding  09/27/2015 erythropoietin 40,000 units weekly initiated  09/27/2015 erythropoietin level 48.3 (range 2.6-18.5)  10/11/2015 serum protein electrophoresis with no M spike observed; serum IFE shows IgA monoclonal protein with lambda light chain specificity; quantitative immunoglobulins show IgG mildly decreased 654 (range [phone]), IgA and IgM in normal range; polyclonal serum light chain elevation  11/15/2015-hemoglobin 11.2  11/29/2015-hemoglobin 11.1  12/12/2005-hemoglobin 10.4; Aranesp 100 g every 2 weeks initiated  02/07/2016-hemoglobin 11.0, Aranesp held, changed to an every 3 week schedule  Progressive anemia and decreased iron stores December 2017, treated with IV iron January 2018 with improvement  She did not require Aranesp for 2 months and resumed Aranesp on a monthly schedule for 08/2016  2. Chronic renal insufficiency  3. Atrial fibrillation-maintained on apixaban  4. Osteoarthritis  5. Diabetes  6. Sjogren's disease  7. Pericarditis February 2017   Disposition:  She appears stable.  The hemoglobin is in the goal range on a monthly Aranesp schedule.  She would like to increase the interval between Aranesp injections.  We will try moving to a 6-week schedule.  She will continue iron.  We will follow-up on the serum iron studies and administer IV iron if the iron levels are low or hemoglobin falls.  She will return for Aranesp in 6 weeks and an office visit in 3 months.  15 minutes were spent with the patient today.  The majority of the time was used  for counseling and coordination of care.  [name], MD  05/04/2017  2:11 PM

## 2017-05-04 NOTE — Patient Instructions (Signed)
Darbepoetin Alfa injection What is this medicine? DARBEPOETIN ALFA (dar be POE e tin AL fa) helps your body make more red blood cells. It is used to treat anemia caused by chronic kidney failure and chemotherapy. This medicine may be used for other purposes; ask your health care provider or pharmacist if you have questions. What should I tell my health care provider before I take this medicine? They need to know if you have any of these conditions: -blood clotting disorders or history of blood clots -cancer patient not on chemotherapy -cystic fibrosis -heart disease, such as angina, heart failure, or a history of a heart attack -hemoglobin level of 12 g/dL or greater -high blood pressure -low levels of folate, iron, or vitamin B12 -seizures -an unusual or allergic reaction to darbepoetin, erythropoietin, albumin, hamster proteins, latex, other medicines, foods, dyes, or preservatives -pregnant or trying to get pregnant -breast-feeding How should I use this medicine? This medicine is for injection into a vein or under the skin. It is usually given by a health care professional in a hospital or clinic setting. If you get this medicine at home, you will be taught how to prepare and give this medicine. Do not shake the solution before you withdraw a dose. Use exactly as directed. Take your medicine at regular intervals. Do not take your medicine more often than directed. It is important that you put your used needles and syringes in a special sharps container. Do not put them in a trash can. If you do not have a sharps container, call your pharmacist or healthcare provider to get one. Talk to your pediatrician regarding the use of this medicine in children. While this medicine may be used in children as young as 1 year for selected conditions, precautions do apply. Overdosage: If you think you have taken too much of this medicine contact a poison control center or emergency room at once. NOTE:  This medicine is only for you. Do not share this medicine with others. What if I miss a dose? If you miss a dose, take it as soon as you can. If it is almost time for your next dose, take only that dose. Do not take double or extra doses. What may interact with this medicine? Do not take this medicine with any of the following medications: -epoetin alfa This list may not describe all possible interactions. Give your health care provider a list of all the medicines, herbs, non-prescription drugs, or dietary supplements you use. Also tell them if you smoke, drink alcohol, or use illegal drugs. Some items may interact with your medicine. What should I watch for while using this medicine? Visit your prescriber or health care professional for regular checks on your progress and for the needed blood tests and blood pressure measurements. It is especially important for the doctor to make sure your hemoglobin level is in the desired range, to limit the risk of potential side effects and to give you the best benefit. Keep all appointments for any recommended tests. Check your blood pressure as directed. Ask your doctor what your blood pressure should be and when you should contact him or her. As your body makes more red blood cells, you may need to take iron, folic acid, or vitamin B supplements. Ask your doctor or health care provider which products are right for you. If you have kidney disease continue dietary restrictions, even though this medication can make you feel better. Talk with your doctor or health care professional about the   foods you eat and the vitamins that you take. What side effects may I notice from receiving this medicine? Side effects that you should report to your doctor or health care professional as soon as possible: -allergic reactions like skin rash, itching or hives, swelling of the face, lips, or tongue -breathing problems -changes in vision -chest pain -confusion, trouble speaking  or understanding -feeling faint or lightheaded, falls -high blood pressure -muscle aches or pains -pain, swelling, warmth in the leg -rapid weight gain -severe headaches -sudden numbness or weakness of the face, arm or leg -trouble walking, dizziness, loss of balance or coordination -seizures (convulsions) -swelling of the ankles, feet, hands -unusually weak or tired Side effects that usually do not require medical attention (report to your doctor or health care professional if they continue or are bothersome): -diarrhea -fever, chills (flu-like symptoms) -headaches -nausea, vomiting -redness, stinging, or swelling at site where injected This list may not describe all possible side effects. Call your doctor for medical advice about side effects. You may report side effects to FDA at 1-800-FDA-1088. Where should I keep my medicine? Keep out of the reach of children. Store in a refrigerator between 2 and 8 degrees C (36 and 46 degrees F). Do not freeze. Do not shake. Throw away any unused portion if using a single-dose vial. Throw away any unused medicine after the expiration date. NOTE: This sheet is a summary. It may not cover all possible information. If you have questions about this medicine, talk to your doctor, pharmacist, or health care provider.    2016, Elsevier/[employer]. (2008-05-22 10:23:57)

## 2017-05-04 NOTE — Telephone Encounter (Signed)
Gave avs and calendar for December and February 2019

## 2017-05-08 ENCOUNTER — Other Ambulatory Visit: Payer: Self-pay | Admitting: Internal Medicine

## 2017-06-04 ENCOUNTER — Telehealth: Payer: Self-pay

## 2017-06-04 NOTE — Telephone Encounter (Signed)
Left vm requesting patient call back so we can schedule prolia injection

## 2017-06-08 ENCOUNTER — Telehealth: Payer: Self-pay

## 2017-06-08 NOTE — Telephone Encounter (Signed)
Unable to reach patient to schedule the prolia injection- I am assuming she will not be getting it this years as I have made several attempts to reach her

## 2017-06-08 NOTE — Telephone Encounter (Signed)
Tricia Gonzales, please retry and also send her a letter >> she cannot miss injections!

## 2017-06-09 NOTE — Telephone Encounter (Signed)
Spoke to the patient today and she has refused to come in to get the Prolia injection- patient states she can not make it here until next year it is too hard for her to come into the office

## 2017-06-09 NOTE — Telephone Encounter (Signed)
So sorry to hear that >> we cannot use Reclast due to her low GFR but if she wants to try again Fosamax 70 mg once a week, I would highly recommend to do this since otherwise she will lose all the benefit that she got from Prolia in the past and is at higher risk for fracture in the next year.

## 2017-06-10 NOTE — Telephone Encounter (Signed)
Patient has declined fosamax and she does want to continue with Prolia but she will not come in again until next year to get this- I tried to get the patient to come in so that there is no gap in therapy but she refused- I let the patient know that I will start the verification process mid January- she agreed to this

## 2017-06-10 NOTE — Telephone Encounter (Signed)
Okay, thank you, Lattie Haw.

## 2017-06-16 DIAGNOSIS — Z8739 Personal history of other diseases of the musculoskeletal system and connective tissue: Secondary | ICD-10-CM | POA: Insufficient documentation

## 2017-06-16 DIAGNOSIS — F32A Depression, unspecified: Secondary | ICD-10-CM | POA: Insufficient documentation

## 2017-06-16 DIAGNOSIS — K56609 Unspecified intestinal obstruction, unspecified as to partial versus complete obstruction: Secondary | ICD-10-CM | POA: Insufficient documentation

## 2017-06-16 DIAGNOSIS — D649 Anemia, unspecified: Secondary | ICD-10-CM | POA: Insufficient documentation

## 2017-06-16 DIAGNOSIS — K449 Diaphragmatic hernia without obstruction or gangrene: Secondary | ICD-10-CM | POA: Insufficient documentation

## 2017-06-16 DIAGNOSIS — I480 Paroxysmal atrial fibrillation: Secondary | ICD-10-CM | POA: Insufficient documentation

## 2017-06-16 DIAGNOSIS — M1990 Unspecified osteoarthritis, unspecified site: Secondary | ICD-10-CM | POA: Insufficient documentation

## 2017-06-16 DIAGNOSIS — R011 Cardiac murmur, unspecified: Secondary | ICD-10-CM | POA: Insufficient documentation

## 2017-06-16 DIAGNOSIS — K219 Gastro-esophageal reflux disease without esophagitis: Secondary | ICD-10-CM | POA: Insufficient documentation

## 2017-06-16 DIAGNOSIS — N183 Chronic kidney disease, stage 3 unspecified: Secondary | ICD-10-CM | POA: Insufficient documentation

## 2017-06-16 DIAGNOSIS — M3500 Sicca syndrome, unspecified: Secondary | ICD-10-CM | POA: Insufficient documentation

## 2017-06-16 DIAGNOSIS — E538 Deficiency of other specified B group vitamins: Secondary | ICD-10-CM | POA: Insufficient documentation

## 2017-06-16 DIAGNOSIS — G8929 Other chronic pain: Secondary | ICD-10-CM | POA: Insufficient documentation

## 2017-06-16 DIAGNOSIS — E669 Obesity, unspecified: Secondary | ICD-10-CM | POA: Insufficient documentation

## 2017-06-16 DIAGNOSIS — K2970 Gastritis, unspecified, without bleeding: Secondary | ICD-10-CM | POA: Insufficient documentation

## 2017-06-16 DIAGNOSIS — I4891 Unspecified atrial fibrillation: Secondary | ICD-10-CM | POA: Insufficient documentation

## 2017-06-16 DIAGNOSIS — H269 Unspecified cataract: Secondary | ICD-10-CM | POA: Insufficient documentation

## 2017-06-16 DIAGNOSIS — F329 Major depressive disorder, single episode, unspecified: Secondary | ICD-10-CM | POA: Insufficient documentation

## 2017-06-16 DIAGNOSIS — Z950 Presence of cardiac pacemaker: Secondary | ICD-10-CM | POA: Insufficient documentation

## 2017-06-16 DIAGNOSIS — K5790 Diverticulosis of intestine, part unspecified, without perforation or abscess without bleeding: Secondary | ICD-10-CM | POA: Insufficient documentation

## 2017-06-16 DIAGNOSIS — F419 Anxiety disorder, unspecified: Secondary | ICD-10-CM | POA: Insufficient documentation

## 2017-06-16 DIAGNOSIS — E119 Type 2 diabetes mellitus without complications: Secondary | ICD-10-CM | POA: Insufficient documentation

## 2017-06-16 DIAGNOSIS — Z9289 Personal history of other medical treatment: Secondary | ICD-10-CM | POA: Insufficient documentation

## 2017-06-16 DIAGNOSIS — I319 Disease of pericardium, unspecified: Secondary | ICD-10-CM | POA: Insufficient documentation

## 2017-06-16 DIAGNOSIS — K222 Esophageal obstruction: Secondary | ICD-10-CM | POA: Insufficient documentation

## 2017-06-16 DIAGNOSIS — M549 Dorsalgia, unspecified: Secondary | ICD-10-CM

## 2017-06-16 DIAGNOSIS — N200 Calculus of kidney: Secondary | ICD-10-CM | POA: Insufficient documentation

## 2017-06-18 ENCOUNTER — Ambulatory Visit (INDEPENDENT_AMBULATORY_CARE_PROVIDER_SITE_OTHER): Payer: Medicare Other | Admitting: *Deleted

## 2017-06-18 ENCOUNTER — Ambulatory Visit (HOSPITAL_BASED_OUTPATIENT_CLINIC_OR_DEPARTMENT_OTHER): Payer: Medicare Other

## 2017-06-18 ENCOUNTER — Other Ambulatory Visit (HOSPITAL_BASED_OUTPATIENT_CLINIC_OR_DEPARTMENT_OTHER): Payer: Medicare Other

## 2017-06-18 VITALS — BP 158/64 | HR 77 | Temp 97.9°F | Resp 18

## 2017-06-18 DIAGNOSIS — I442 Atrioventricular block, complete: Secondary | ICD-10-CM

## 2017-06-18 DIAGNOSIS — N183 Chronic kidney disease, stage 3 unspecified: Secondary | ICD-10-CM

## 2017-06-18 DIAGNOSIS — N189 Chronic kidney disease, unspecified: Secondary | ICD-10-CM

## 2017-06-18 DIAGNOSIS — D631 Anemia in chronic kidney disease: Secondary | ICD-10-CM

## 2017-06-18 LAB — CBC WITH DIFFERENTIAL/PLATELET
BASO%: 0.2 % (ref 0.0–2.0)
Basophils Absolute: 0 10*3/uL (ref 0.0–0.1)
EOS%: 0.4 % (ref 0.0–7.0)
Eosinophils Absolute: 0 10*3/uL (ref 0.0–0.5)
HCT: 25.9 % — ABNORMAL LOW (ref 34.8–46.6)
HGB: 8.4 g/dL — ABNORMAL LOW (ref 11.6–15.9)
LYMPH%: 6.7 % — ABNORMAL LOW (ref 14.0–49.7)
MCH: 29.5 pg (ref 25.1–34.0)
MCHC: 32.3 g/dL (ref 31.5–36.0)
MCV: 91.4 fL (ref 79.5–101.0)
MONO#: 0.5 10*3/uL (ref 0.1–0.9)
MONO%: 5.8 % (ref 0.0–14.0)
NEUT#: 8 10*3/uL — ABNORMAL HIGH (ref 1.5–6.5)
NEUT%: 86.9 % — ABNORMAL HIGH (ref 38.4–76.8)
Platelets: 264 10*3/uL (ref 145–400)
RBC: 2.83 10*6/uL — ABNORMAL LOW (ref 3.70–5.45)
RDW: 15.1 % — ABNORMAL HIGH (ref 11.2–14.5)
WBC: 9.2 10*3/uL (ref 3.9–10.3)
lymph#: 0.6 10*3/uL — ABNORMAL LOW (ref 0.9–3.3)

## 2017-06-18 MED ORDER — DARBEPOETIN ALFA 100 MCG/0.5ML IJ SOSY
100.0000 ug | PREFILLED_SYRINGE | Freq: Once | INTRAMUSCULAR | Status: AC
  Administered 2017-06-18: 100 ug via SUBCUTANEOUS
  Filled 2017-06-18: qty 0.5

## 2017-06-18 NOTE — Patient Instructions (Signed)
Darbepoetin Alfa injection What is this medicine? DARBEPOETIN ALFA (dar be POE e tin AL fa) helps your body make more red blood cells. It is used to treat anemia caused by chronic kidney failure and chemotherapy. This medicine may be used for other purposes; ask your health care provider or pharmacist if you have questions. What should I tell my health care provider before I take this medicine? They need to know if you have any of these conditions: -blood clotting disorders or history of blood clots -cancer patient not on chemotherapy -cystic fibrosis -heart disease, such as angina, heart failure, or a history of a heart attack -hemoglobin level of 12 g/dL or greater -high blood pressure -low levels of folate, iron, or vitamin B12 -seizures -an unusual or allergic reaction to darbepoetin, erythropoietin, albumin, hamster proteins, latex, other medicines, foods, dyes, or preservatives -pregnant or trying to get pregnant -breast-feeding How should I use this medicine? This medicine is for injection into a vein or under the skin. It is usually given by a health care professional in a hospital or clinic setting. If you get this medicine at home, you will be taught how to prepare and give this medicine. Do not shake the solution before you withdraw a dose. Use exactly as directed. Take your medicine at regular intervals. Do not take your medicine more often than directed. It is important that you put your used needles and syringes in a special sharps container. Do not put them in a trash can. If you do not have a sharps container, call your pharmacist or healthcare provider to get one. Talk to your pediatrician regarding the use of this medicine in children. While this medicine may be used in children as young as 1 year for selected conditions, precautions do apply. Overdosage: If you think you have taken too much of this medicine contact a poison control center or emergency room at once. NOTE:  This medicine is only for you. Do not share this medicine with others. What if I miss a dose? If you miss a dose, take it as soon as you can. If it is almost time for your next dose, take only that dose. Do not take double or extra doses. What may interact with this medicine? Do not take this medicine with any of the following medications: -epoetin alfa This list may not describe all possible interactions. Give your health care provider a list of all the medicines, herbs, non-prescription drugs, or dietary supplements you use. Also tell them if you smoke, drink alcohol, or use illegal drugs. Some items may interact with your medicine. What should I watch for while using this medicine? Visit your prescriber or health care professional for regular checks on your progress and for the needed blood tests and blood pressure measurements. It is especially important for the doctor to make sure your hemoglobin level is in the desired range, to limit the risk of potential side effects and to give you the best benefit. Keep all appointments for any recommended tests. Check your blood pressure as directed. Ask your doctor what your blood pressure should be and when you should contact him or her. As your body makes more red blood cells, you may need to take iron, folic acid, or vitamin B supplements. Ask your doctor or health care provider which products are right for you. If you have kidney disease continue dietary restrictions, even though this medication can make you feel better. Talk with your doctor or health care professional about the  foods you eat and the vitamins that you take. What side effects may I notice from receiving this medicine? Side effects that you should report to your doctor or health care professional as soon as possible: -allergic reactions like skin rash, itching or hives, swelling of the face, lips, or tongue -breathing problems -changes in vision -chest pain -confusion, trouble speaking  or understanding -feeling faint or lightheaded, falls -high blood pressure -muscle aches or pains -pain, swelling, warmth in the leg -rapid weight gain -severe headaches -sudden numbness or weakness of the face, arm or leg -trouble walking, dizziness, loss of balance or coordination -seizures (convulsions) -swelling of the ankles, feet, hands -unusually weak or tired Side effects that usually do not require medical attention (report to your doctor or health care professional if they continue or are bothersome): -diarrhea -fever, chills (flu-like symptoms) -headaches -nausea, vomiting -redness, stinging, or swelling at site where injected This list may not describe all possible side effects. Call your doctor for medical advice about side effects. You may report side effects to FDA at 1-800-FDA-1088. Where should I keep my medicine? Keep out of the reach of children. Store in a refrigerator between 2 and 8 degrees C (36 and 46 degrees F). Do not freeze. Do not shake. Throw away any unused portion if using a single-dose vial. Throw away any unused medicine after the expiration date. NOTE: This sheet is a summary. It may not cover all possible information. If you have questions about this medicine, talk to your doctor, pharmacist, or health care provider.    2016, [employer]vier/[employer] Standard. (2008-05-22 10:23:57)

## 2017-06-18 NOTE — Progress Notes (Signed)
Remote pacemaker transmission.   

## 2017-06-21 ENCOUNTER — Other Ambulatory Visit: Payer: Self-pay

## 2017-06-21 ENCOUNTER — Encounter: Payer: Self-pay | Admitting: Cardiology

## 2017-06-21 ENCOUNTER — Telehealth: Payer: Self-pay

## 2017-06-21 DIAGNOSIS — N189 Chronic kidney disease, unspecified: Principal | ICD-10-CM

## 2017-06-21 DIAGNOSIS — D631 Anemia in chronic kidney disease: Secondary | ICD-10-CM

## 2017-06-21 NOTE — Telephone Encounter (Signed)
-----  Message from Ladell Pier, MD sent at 06/20/2017  8:35 AM EST ----- Please call patient, hb is lower,may be due to no epo for 6 weeks Repeat cbc 2 weeks, may need to increase aranesp or give iv iron, add ferritin, iron , tibc to next lab

## 2017-06-21 NOTE — Telephone Encounter (Signed)
Informed patient of message below. Patient voiced understanding.

## 2017-06-23 ENCOUNTER — Telehealth: Payer: Self-pay | Admitting: Oncology

## 2017-06-23 NOTE — Telephone Encounter (Signed)
Scheduled appt per 12/31 sch message - left message with appt date and time.

## 2017-07-02 ENCOUNTER — Encounter: Payer: Self-pay | Admitting: Internal Medicine

## 2017-07-02 ENCOUNTER — Ambulatory Visit (INDEPENDENT_AMBULATORY_CARE_PROVIDER_SITE_OTHER): Payer: Medicare Other | Admitting: Internal Medicine

## 2017-07-02 VITALS — BP 138/78 | HR 62 | Ht 58.5 in | Wt 255.0 lb

## 2017-07-02 DIAGNOSIS — I480 Paroxysmal atrial fibrillation: Secondary | ICD-10-CM

## 2017-07-02 DIAGNOSIS — Z950 Presence of cardiac pacemaker: Secondary | ICD-10-CM

## 2017-07-02 DIAGNOSIS — R0600 Dyspnea, unspecified: Secondary | ICD-10-CM

## 2017-07-02 DIAGNOSIS — I10 Essential (primary) hypertension: Secondary | ICD-10-CM | POA: Diagnosis not present

## 2017-07-02 DIAGNOSIS — R001 Bradycardia, unspecified: Secondary | ICD-10-CM | POA: Diagnosis not present

## 2017-07-02 NOTE — Patient Instructions (Addendum)
Medication Instructions:  Your physician recommends that you continue on your current medications as directed. Please refer to the Current Medication list given to you today.  Labwork: None ordered.  Testing/Procedures: Your physician has requested that you have an echocardiogram. Echocardiography is a painless test that uses sound waves to create images of your heart. It provides your doctor with information about the size and shape of your heart and how well your heart's chambers and valves are working. This procedure takes approximately one hour. There are no restrictions for this procedure.  Please schedule for an ECHO.  Follow-Up: Your physician wants you to follow-up in: one year with Dr. Lovena Le.   You will receive a reminder letter in the mail two months in advance. If you don't receive a letter, please call our office to schedule the follow-up appointment.  Remote monitoring is used to monitor your Pacemaker from home. This monitoring reduces the number of office visits required to check your device to one time per year. It allows [place] to keep an eye on the functioning of your device to ensure it is working properly. You are scheduled for a device check from home on 09/21/2017. You may send your transmission at any time that day. If you have a wireless device, the transmission will be sent automatically. After your physician reviews your transmission, you will receive a postcard with your next transmission date.  Any Other Special Instructions Will Be Listed Below (If Applicable).   If you need a refill on your cardiac medications before your next appointment, please call your pharmacy.

## 2017-07-02 NOTE — Progress Notes (Signed)
HPI Tricia Gonzales returns today for followup. 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 In the interim, she has not passed out. She c/o feeling week and tired. She is sob. She denies dietary indiscretion. No additional syncope.  Allergies  Allergen Reactions  . Norvasc [Amlodipine Besylate] Shortness Of Breath  . Avapro [Irbesartan]     Headaches  . Glucophage [Metformin Hydrochloride] Other (See Comments)    "increases creatinine"  . Lisinopril     Headache   . Losartan     Headache   . Morphine And Related Other (See Comments)    "hallucinations"  . Simvastatin Other (See Comments)    Body aches  . Trulicity [Dulaglutide] Other (See Comments)    Severe mood swings     Current Outpatient Medications  Medication Sig Dispense Refill  . ACCU-CHEK AVIVA PLUS test strip 1 each by Other route daily.     Marland Kitchen acetaminophen (TYLENOL) 650 MG CR tablet Take 1,300 mg by mouth at bedtime as needed for pain.    . Carboxymethylcellulose Sodium (THERATEARS OP) Apply 1 drop to eye daily as needed (dry eyes).    Marland Kitchen denosumab (PROLIA) 60 MG/ML SOLN injection Inject 60 mg into the skin every 6 (six) months. Administer in upper arm, thigh, or abdomen    . doxazosin (CARDURA) 4 MG tablet Take 4 mg by mouth daily.    Marland Kitchen ELIQUIS 5 MG TABS tablet TAKE ONE TABLET TWICE DAILY 180 tablet 1  . Epoetin Alfa (PROCRIT IJ) Inject 1 Dose as directed every 21 ( twenty-one) days.     . ferrous sulfate 325 (65 FE) MG EC tablet Take 1 tablet (325 mg total) by mouth 2 (two) times daily. (Patient taking differently: Take 325 mg by mouth at bedtime. Once a day) 60 tablet 3  . furosemide (LASIX) 40 MG tablet Take 40 mg daily by mouth.    . gabapentin (NEURONTIN) 300 MG capsule Take 300 mg by mouth 2 (two) times daily.     Marland Kitchen HYDROcodone-acetaminophen (NORCO) 5-325 MG tablet Take 1 tablet by mouth every 6 (six) hours as needed  for moderate pain. 30 tablet 0  . JANUVIA 50 MG tablet Take 50 mg by mouth daily.     Marland Kitchen levothyroxine (SYNTHROID, LEVOTHROID) 50 MCG tablet Take 50 mcg daily by mouth.    . metoprolol succinate (TOPROL-XL) 25 MG 24 hr tablet Take 1.5 tablets (37.5 mg total) daily by mouth. Please keep upcoming appt with Dr. Lovena Le in January. Thank you 45 tablet 1  . montelukast (SINGULAIR) 10 MG tablet Take 10 mg by mouth at bedtime.     . Multiple Vitamin (MULITIVITAMIN WITH MINERALS) TABS Take 1 tablet by mouth daily.    . pantoprazole (PROTONIX) 40 MG tablet Take 1 tablet (40 mg total) by mouth daily. 30 tablet 1  . pentoxifylline (TRENTAL) 400 MG CR tablet Take 400 mg by mouth daily.    . pravastatin (PRAVACHOL) 40 MG tablet Take 40 mg by mouth every evening.     Marland Kitchen TOUJEO SOLOSTAR 300 UNIT/ML SOPN Inject 22 Units into the skin every evening.     Marland Kitchen ULORIC 40 MG tablet Take 40 mg by mouth daily.      No current facility-administered medications for this visit.      Past Medical History:  Diagnosis Date  . Anemia   . Anemia in chronic kidney disease  10/29/2015  . Anxiety   . Arthritis   . Atrial fibrillation ([place])   . Chronic back pain   . Chronic kidney disease (CKD), stage III (moderate) (HCC)   . Depression   . Diverticulosis   . Early cataracts, bilateral   . Esophageal stricture   . Gastritis   . GERD (gastroesophageal reflux disease)   . Heart murmur     " some doctors say that I have one some say that I don"t "  . Hiatal hernia   . History of blood transfusion    "@ least w/1st knee OR"  . History of gout   . Hyperlipidemia   . Hypertension   . Intestinal obstruction ([place])   . Kidney stones   . Obesity   . Osteoarthritis   . Pericarditis   . Presence of permanent cardiac pacemaker   . Sjogren's disease ([place])   . Type II diabetes mellitus ([place])   . Vitamin B12 deficiency     ROS:   All systems reviewed and negative except as noted in the HPI.   Past Surgical History:    Procedure Laterality Date  . ABDOMINAL HYSTERECTOMY    . CHOLECYSTECTOMY OPEN    . COLECTOMY     for rectovaginal fistula  . COLONOSCOPY  01/07/2012   Procedure: COLONOSCOPY;  Surgeon: Lafayette Dragon, MD;  Location: [facility];  Service: Endoscopy;  Laterality: N/A;  . EP IMPLANTABLE DEVICE N/A 06/18/2015   Procedure: Pacemaker Implant;  Surgeon: Evans Lance, MD;  Location: [facility];  Service: Cardiovascular;  Laterality: N/A;  . ESOPHAGOGASTRODUODENOSCOPY (EGD) WITH ESOPHAGEAL DILATION    . ESOPHAGOGASTRODUODENOSCOPY (EGD) WITH PROPOFOL N/A 08/12/2015   Procedure: ESOPHAGOGASTRODUODENOSCOPY (EGD) WITH PROPOFOL;  Surgeon: Manus Gunning, MD;  Location: [facility];  Service: Gastroenterology;  Laterality: N/A;  . I&D EXTREMITY Right 08/06/2016   Procedure: DEBRIDEMENT PIP RIGHT RING FINGER;  Surgeon: Daryll Brod, MD;  Location: [facility];  Service: Orthopedics;  Laterality: Right;  . INSERT / REPLACE / REMOVE PACEMAKER    . JOINT REPLACEMENT    . KNEE ARTHROSCOPY Right   . MASS EXCISION Right 08/06/2016   Procedure: EXCISION CYST;  Surgeon: Daryll Brod, MD;  Location: [facility];  Service: Orthopedics;  Laterality: Right;  . REVISION TOTAL KNEE ARTHROPLASTY Left   . TOTAL KNEE ARTHROPLASTY Bilateral      Family History  Problem Relation Age of Onset  . Diabetes Mother   . Diabetes Brother   . Hypertension Sister   . Colon cancer Neg Hx   . Heart attack Neg Hx   . Stroke Neg Hx      Social History   Socioeconomic History  . Marital status: Divorced    Spouse name: Not on file  . Number of children: 2  . Years of education: Not on file  . Highest education level: Not on file  Social Needs  . Financial resource strain: Not on file  . Food insecurity - worry: Not on file  . Food insecurity - inability: Not on file  . Transportation needs - medical: Not on file  . Transportation needs - non-medical: Not on file  Occupational History  . Not on file  Tobacco  Use  . Smoking status: Former Smoker    Packs/day: 2.00    Years: 3.00    Pack years: 6.00    Types: Cigarettes    Last attempt to quit: 06/23/1983    Years since quitting: 34.0  .  Smokeless tobacco: Never Used  Substance and Sexual Activity  . Alcohol use: Yes    Alcohol/week: 1.2 oz    Types: 2 Glasses of wine per week    Comment: occ  . Drug use: No  . Sexual activity: No  Other Topics Concern  . Not on file  Social History Narrative  . Not on file     BP 138/78   Pulse 62   Ht 4' 10.5" (1.486 m)   Wt 255 lb (115.7 kg)   BMI 52.39 kg/m   Physical Exam:  Well appearing but morbidly obese, NAD HEENT: Unremarkable Neck:  Unable to assess JVD, no thyromegally Lymphatics:  No adenopathy Back:  No CVA tenderness Lungs:  Clear with no wheezes HEART:  Regular rate rhythm, no murmurs, no rubs, no clicks Abd:  Soft, obese, positive bowel sounds, no organomegally, no rebound, no guarding Ext:  2 plus pulses, no edema, no cyanosis, no clubbing Skin:  No rashes no nodules Neuro:  CN II through XII intact, motor grossly intact  EKG - NSR with RBBB  DEVICE  Normal device function.  See PaceArt for details.   Assess/Plan: 1. [place] syncope - she has had no recurrent syncope since her PPM was inserted.  2. PPM - her [employer] DDD PM is working normally. Will recheck in several months. 3. Obesity - she is strongly encouraged to lose weight.  4. Dyspnea - I suspect that this is due to obesity but will repeat her 2 D echo to evaluate her pulmonary pressures and LV function and valves.  Mikle Bosworth.D.

## 2017-07-05 ENCOUNTER — Inpatient Hospital Stay: Payer: Medicare Other | Attending: Oncology

## 2017-07-05 DIAGNOSIS — N189 Chronic kidney disease, unspecified: Secondary | ICD-10-CM | POA: Diagnosis not present

## 2017-07-05 DIAGNOSIS — D631 Anemia in chronic kidney disease: Secondary | ICD-10-CM | POA: Insufficient documentation

## 2017-07-05 LAB — CBC WITH DIFFERENTIAL/PLATELET
Basophils Absolute: 0 10*3/uL (ref 0.0–0.1)
Basophils Relative: 0 %
Eosinophils Absolute: 0 10*3/uL (ref 0.0–0.5)
Eosinophils Relative: 1 %
HCT: 28 % — ABNORMAL LOW (ref 34.8–46.6)
Hemoglobin: 8.5 g/dL — ABNORMAL LOW (ref 11.6–15.9)
Lymphocytes Relative: 10 %
Lymphs Abs: 0.9 10*3/uL (ref 0.9–3.3)
MCH: 28.9 pg (ref 25.1–34.0)
MCHC: 30.4 g/dL — ABNORMAL LOW (ref 31.5–36.0)
MCV: 95.2 fL (ref 79.5–101.0)
Monocytes Absolute: 0.4 10*3/uL (ref 0.1–0.9)
Monocytes Relative: 4 %
Neutro Abs: 7.4 10*3/uL — ABNORMAL HIGH (ref 1.5–6.5)
Neutrophils Relative %: 85 %
Platelets: 268 10*3/uL (ref 145–400)
RBC: 2.94 MIL/uL — ABNORMAL LOW (ref 3.70–5.45)
RDW: 14.6 % (ref 11.2–16.1)
WBC: 8.7 10*3/uL (ref 3.9–10.3)

## 2017-07-05 LAB — IRON AND TIBC
Iron: 53 ug/dL (ref 41–142)
Saturation Ratios: 15 % — ABNORMAL LOW (ref 21–57)
TIBC: 354 ug/dL (ref 236–444)
UIBC: 301 ug/dL

## 2017-07-05 LAB — FERRITIN: Ferritin: 27 ng/mL (ref 9–269)

## 2017-07-06 LAB — CUP PACEART INCLINIC DEVICE CHECK
Battery Remaining Longevity: 94 mo
Battery Voltage: 3.01 V
Brady Statistic AP VP Percent: 62.57 %
Brady Statistic AP VS Percent: 3.71 %
Brady Statistic AS VP Percent: 7.53 %
Brady Statistic AS VS Percent: 26.19 %
Brady Statistic RA Percent Paced: 66.19 %
Brady Statistic RV Percent Paced: 70.21 %
Date Time Interrogation Session: 20190111172033
Implantable Lead Implant Date: 20161227
Implantable Lead Implant Date: 20161227
Implantable Lead Location: 753859
Implantable Lead Location: 753860
Implantable Lead Model: 5076
Implantable Lead Model: 5076
Implantable Pulse Generator Implant Date: 20161227
Lead Channel Impedance Value: 342 Ohm
Lead Channel Impedance Value: 361 Ohm
Lead Channel Impedance Value: 380 Ohm
Lead Channel Impedance Value: 418 Ohm
Lead Channel Pacing Threshold Amplitude: 0.75 V
Lead Channel Pacing Threshold Amplitude: 0.75 V
Lead Channel Pacing Threshold Pulse Width: 0.4 ms
Lead Channel Pacing Threshold Pulse Width: 0.4 ms
Lead Channel Sensing Intrinsic Amplitude: 1.25 mV
Lead Channel Sensing Intrinsic Amplitude: 1.875 mV
Lead Channel Sensing Intrinsic Amplitude: 10.25 mV
Lead Channel Sensing Intrinsic Amplitude: 12.25 mV
Lead Channel Setting Pacing Amplitude: 2 V
Lead Channel Setting Pacing Amplitude: 2.5 V
Lead Channel Setting Pacing Pulse Width: 0.4 ms
Lead Channel Setting Sensing Sensitivity: 2.8 mV

## 2017-07-12 ENCOUNTER — Other Ambulatory Visit: Payer: Self-pay | Admitting: Internal Medicine

## 2017-07-12 LAB — CUP PACEART REMOTE DEVICE CHECK
Date Time Interrogation Session: 20190121114900
Implantable Lead Implant Date: 20161227
Implantable Lead Implant Date: 20161227
Implantable Lead Location: 753859
Implantable Lead Location: 753860
Implantable Lead Model: 5076
Implantable Lead Model: 5076
Implantable Pulse Generator Implant Date: 20161227
Lead Channel Setting Pacing Amplitude: 2 V
Lead Channel Setting Pacing Amplitude: 2.5 V
Lead Channel Setting Pacing Pulse Width: 0.4 ms
Lead Channel Setting Sensing Sensitivity: 2.8 mV

## 2017-07-13 ENCOUNTER — Other Ambulatory Visit: Payer: Self-pay

## 2017-07-13 ENCOUNTER — Ambulatory Visit (HOSPITAL_COMMUNITY): Payer: Medicare Other | Attending: Cardiology

## 2017-07-13 ENCOUNTER — Encounter (INDEPENDENT_AMBULATORY_CARE_PROVIDER_SITE_OTHER): Payer: Self-pay

## 2017-07-13 DIAGNOSIS — E1122 Type 2 diabetes mellitus with diabetic chronic kidney disease: Secondary | ICD-10-CM | POA: Diagnosis not present

## 2017-07-13 DIAGNOSIS — I129 Hypertensive chronic kidney disease with stage 1 through stage 4 chronic kidney disease, or unspecified chronic kidney disease: Secondary | ICD-10-CM | POA: Insufficient documentation

## 2017-07-13 DIAGNOSIS — I340 Nonrheumatic mitral (valve) insufficiency: Secondary | ICD-10-CM | POA: Insufficient documentation

## 2017-07-13 DIAGNOSIS — R0600 Dyspnea, unspecified: Secondary | ICD-10-CM

## 2017-07-13 DIAGNOSIS — N189 Chronic kidney disease, unspecified: Secondary | ICD-10-CM | POA: Diagnosis not present

## 2017-07-14 ENCOUNTER — Telehealth: Payer: Self-pay

## 2017-07-14 ENCOUNTER — Telehealth: Payer: Self-pay | Admitting: Emergency Medicine

## 2017-07-14 NOTE — Telephone Encounter (Signed)
error

## 2017-07-14 NOTE — Telephone Encounter (Addendum)
Pt verbalized understanding of this. Scheduling message sent.   ----- Message from Owens Shark, NP sent at 07/14/2017  8:29 AM EST ----- Please let her know iron is low. Schedule for feraheme weekly x 2. Let me know when she wants to do and I will enter orders.

## 2017-07-15 ENCOUNTER — Telehealth: Payer: Self-pay | Admitting: Nurse Practitioner

## 2017-07-15 NOTE — Telephone Encounter (Signed)
Scheduled appt per 1/23 sch message - left message with appt date and time.

## 2017-07-21 ENCOUNTER — Telehealth: Payer: Self-pay | Admitting: Internal Medicine

## 2017-07-21 NOTE — Telephone Encounter (Signed)
Patient calling,   would like to know if the echo results are available.

## 2017-07-27 ENCOUNTER — Encounter: Payer: Self-pay | Admitting: Nurse Practitioner

## 2017-07-27 ENCOUNTER — Inpatient Hospital Stay: Payer: Medicare Other | Attending: Oncology | Admitting: Nurse Practitioner

## 2017-07-27 ENCOUNTER — Telehealth: Payer: Self-pay | Admitting: Nurse Practitioner

## 2017-07-27 ENCOUNTER — Inpatient Hospital Stay: Payer: Medicare Other

## 2017-07-27 VITALS — BP 159/54 | HR 59 | Temp 97.7°F | Resp 17 | Ht 58.5 in | Wt 259.9 lb

## 2017-07-27 DIAGNOSIS — D631 Anemia in chronic kidney disease: Secondary | ICD-10-CM

## 2017-07-27 DIAGNOSIS — N183 Chronic kidney disease, stage 3 unspecified: Secondary | ICD-10-CM

## 2017-07-27 DIAGNOSIS — N189 Chronic kidney disease, unspecified: Secondary | ICD-10-CM | POA: Insufficient documentation

## 2017-07-27 DIAGNOSIS — I4891 Unspecified atrial fibrillation: Secondary | ICD-10-CM

## 2017-07-27 DIAGNOSIS — D509 Iron deficiency anemia, unspecified: Secondary | ICD-10-CM | POA: Insufficient documentation

## 2017-07-27 LAB — CBC WITH DIFFERENTIAL/PLATELET
Basophils Absolute: 0 10*3/uL (ref 0.0–0.1)
Basophils Relative: 0 %
Eosinophils Absolute: 0 10*3/uL (ref 0.0–0.5)
Eosinophils Relative: 0 %
HCT: 26.8 % — ABNORMAL LOW (ref 34.8–46.6)
Hemoglobin: 8.6 g/dL — ABNORMAL LOW (ref 11.6–15.9)
Lymphocytes Relative: 10 %
Lymphs Abs: 0.8 10*3/uL — ABNORMAL LOW (ref 0.9–3.3)
MCH: 29.3 pg (ref 25.1–34.0)
MCHC: 32.2 g/dL (ref 31.5–36.0)
MCV: 90.9 fL (ref 79.5–101.0)
Monocytes Absolute: 0.5 10*3/uL (ref 0.1–0.9)
Monocytes Relative: 6 %
Neutro Abs: 6.7 10*3/uL — ABNORMAL HIGH (ref 1.5–6.5)
Neutrophils Relative %: 84 %
Platelets: 267 10*3/uL (ref 145–400)
RBC: 2.95 MIL/uL — ABNORMAL LOW (ref 3.70–5.45)
RDW: 15.1 % — ABNORMAL HIGH (ref 11.2–14.5)
WBC: 8 10*3/uL (ref 3.9–10.3)

## 2017-07-27 LAB — IRON AND TIBC
Iron: 44 ug/dL (ref 41–142)
Saturation Ratios: 12 % — ABNORMAL LOW (ref 21–57)
TIBC: 364 ug/dL (ref 236–444)
UIBC: 320 ug/dL

## 2017-07-27 LAB — FERRITIN: Ferritin: 23 ng/mL (ref 9–269)

## 2017-07-27 MED ORDER — DARBEPOETIN ALFA 100 MCG/0.5ML IJ SOSY
100.0000 ug | PREFILLED_SYRINGE | Freq: Once | INTRAMUSCULAR | Status: AC
  Administered 2017-07-27: 100 ug via SUBCUTANEOUS
  Filled 2017-07-27: qty 0.5

## 2017-07-27 NOTE — Patient Instructions (Signed)
Darbepoetin Alfa injection What is this medicine? DARBEPOETIN ALFA (dar be POE e tin AL fa) helps your body make more red blood cells. It is used to treat anemia caused by chronic kidney failure and chemotherapy. This medicine may be used for other purposes; ask your health care provider or pharmacist if you have questions. What should I tell my health care provider before I take this medicine? They need to know if you have any of these conditions: -blood clotting disorders or history of blood clots -cancer patient not on chemotherapy -cystic fibrosis -heart disease, such as angina, heart failure, or a history of a heart attack -hemoglobin level of 12 g/dL or greater -high blood pressure -low levels of folate, iron, or vitamin B12 -seizures -an unusual or allergic reaction to darbepoetin, erythropoietin, albumin, hamster proteins, latex, other medicines, foods, dyes, or preservatives -pregnant or trying to get pregnant -breast-feeding How should I use this medicine? This medicine is for injection into a vein or under the skin. It is usually given by a health care professional in a hospital or clinic setting. If you get this medicine at home, you will be taught how to prepare and give this medicine. Do not shake the solution before you withdraw a dose. Use exactly as directed. Take your medicine at regular intervals. Do not take your medicine more often than directed. It is important that you put your used needles and syringes in a special sharps container. Do not put them in a trash can. If you do not have a sharps container, call your pharmacist or healthcare provider to get one. Talk to your pediatrician regarding the use of this medicine in children. While this medicine may be used in children as young as 1 year for selected conditions, precautions do apply. Overdosage: If you think you have taken too much of this medicine contact a poison control center or emergency room at once. NOTE:  This medicine is only for you. Do not share this medicine with others. What if I miss a dose? If you miss a dose, take it as soon as you can. If it is almost time for your next dose, take only that dose. Do not take double or extra doses. What may interact with this medicine? Do not take this medicine with any of the following medications: -epoetin alfa This list may not describe all possible interactions. Give your health care provider a list of all the medicines, herbs, non-prescription drugs, or dietary supplements you use. Also tell them if you smoke, drink alcohol, or use illegal drugs. Some items may interact with your medicine. What should I watch for while using this medicine? Visit your prescriber or health care professional for regular checks on your progress and for the needed blood tests and blood pressure measurements. It is especially important for the doctor to make sure your hemoglobin level is in the desired range, to limit the risk of potential side effects and to give you the best benefit. Keep all appointments for any recommended tests. Check your blood pressure as directed. Ask your doctor what your blood pressure should be and when you should contact him or her. As your body makes more red blood cells, you may need to take iron, folic acid, or vitamin B supplements. Ask your doctor or health care provider which products are right for you. If you have kidney disease continue dietary restrictions, even though this medication can make you feel better. Talk with your doctor or health care professional about the  foods you eat and the vitamins that you take. What side effects may I notice from receiving this medicine? Side effects that you should report to your doctor or health care professional as soon as possible: -allergic reactions like skin rash, itching or hives, swelling of the face, lips, or tongue -breathing problems -changes in vision -chest pain -confusion, trouble speaking  or understanding -feeling faint or lightheaded, falls -high blood pressure -muscle aches or pains -pain, swelling, warmth in the leg -rapid weight gain -severe headaches -sudden numbness or weakness of the face, arm or leg -trouble walking, dizziness, loss of balance or coordination -seizures (convulsions) -swelling of the ankles, feet, hands -unusually weak or tired Side effects that usually do not require medical attention (report to your doctor or health care professional if they continue or are bothersome): -diarrhea -fever, chills (flu-like symptoms) -headaches -nausea, vomiting -redness, stinging, or swelling at site where injected This list may not describe all possible side effects. Call your doctor for medical advice about side effects. You may report side effects to FDA at 1-800-FDA-1088. Where should I keep my medicine? Keep out of the reach of children. Store in a refrigerator between 2 and 8 degrees C (36 and 46 degrees F). Do not freeze. Do not shake. Throw away any unused portion if using a single-dose vial. Throw away any unused medicine after the expiration date. NOTE: This sheet is a summary. It may not cover all possible information. If you have questions about this medicine, talk to your doctor, pharmacist, or health care provider.    2016, [employer]vier/[employer] Standard. (2008-05-22 10:23:57)

## 2017-07-27 NOTE — Progress Notes (Signed)
  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  She is maintained on Aranesp every 6 weeks.  She has stable dyspnea on exertion.  No bleeding.  She continues oral iron.  Objective:  Vital signs in last 24 hours:  Blood pressure (!) 159/54, pulse (!) 59, temperature 97.7 F (36.5 C), temperature source Oral, resp. rate 17, height 4' 10.5" (1.486 m), weight 259 lb 14.4 oz (117.9 kg), SpO2 98 %.    Resp: Lungs clear bilaterally. Cardio: Regular rate and rhythm. GI: Abdomen soft and nontender. Vascular: Trace edema at the lower legs bilaterally right slightly greater than left.   Lab Results:  Lab Results  Component Value Date   WBC 8.0 07/27/2017   HGB 8.6 (L) 07/27/2017   HCT 26.8 (L) 07/27/2017   MCV 90.9 07/27/2017   PLT 267 07/27/2017   NEUTROABS 6.7 (H) 07/27/2017    Imaging:  No results found.  Medications: I have reviewed the patient's current medications.  Assessment/Plan: 1. 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; quantitative immunoglobulins show IgG mildly decreased 654 (range [phone]), IgA and IgM in normal range; polyclonal serum light chain elevation  11/15/2015-hemoglobin 11.2  11/29/2015-hemoglobin 11.1  12/12/2005-hemoglobin 10.4; Aranesp 100 g every 2 weeks initiated  02/07/2016-hemoglobin 11.0, Aranesp held, changed to an every 3 week schedule  Progressive anemia and decreased iron stores December 2017,  treated with IV iron January 2018 with improvement  She did not require Aranesp for 2 months and resumed Aranesp on a monthly schedule for 08/2016  Aranesp every 6 weeks beginning 05/04/2017  Progressive anemia and decreased iron stores 07/05/2017; IV iron planned 07/28/2017 and 08/06/2017  2. Chronic renal insufficiency  3. Atrial fibrillation-maintained on apixaban  4. Osteoarthritis  5. Diabetes  6. Sjogren's disease  7. Pericarditis February 2017    Disposition: Tricia Gonzales appears stable.  The hemoglobin is below goal range and iron stores were decreased on recent labs.  Plan to continue Aranesp every 6 weeks for now.  She is scheduled to receive IV iron weekly x2 beginning 07/28/2017.  She will return for a follow-up visit in 3 months.    Ned Card ANP/GNP-BC   07/27/2017  2:34 PM

## 2017-07-27 NOTE — Telephone Encounter (Signed)
Scheduled appt per 2/5 los - Gave patient AVS and calender per los.

## 2017-07-28 ENCOUNTER — Ambulatory Visit: Payer: Medicare Other

## 2017-07-28 ENCOUNTER — Inpatient Hospital Stay: Payer: Medicare Other

## 2017-07-28 VITALS — BP 138/43 | HR 63 | Temp 97.9°F | Resp 18 | Wt 259.0 lb

## 2017-07-28 DIAGNOSIS — N189 Chronic kidney disease, unspecified: Secondary | ICD-10-CM | POA: Diagnosis not present

## 2017-07-28 DIAGNOSIS — D631 Anemia in chronic kidney disease: Secondary | ICD-10-CM

## 2017-07-28 DIAGNOSIS — N183 Chronic kidney disease, stage 3 unspecified: Secondary | ICD-10-CM

## 2017-07-28 MED ORDER — SODIUM CHLORIDE 0.9 % IV SOLN
Freq: Once | INTRAVENOUS | Status: AC
  Administered 2017-07-28: 14:00:00 via INTRAVENOUS

## 2017-07-28 MED ORDER — SODIUM CHLORIDE 0.9 % IV SOLN
510.0000 mg | Freq: Once | INTRAVENOUS | Status: AC
  Administered 2017-07-28: 510 mg via INTRAVENOUS
  Filled 2017-07-28: qty 17

## 2017-07-28 NOTE — Patient Instructions (Signed)
Ferumoxytol injection What is this medicine? FERUMOXYTOL is an iron complex. Iron is used to make healthy red blood cells, which carry oxygen and nutrients throughout the body. This medicine is used to treat iron deficiency anemia in people with chronic kidney disease. COMMON BRAND NAME(S): Feraheme What should I tell my health care provider before I take this medicine? They need to know if you have any of these conditions: -anemia not caused by low iron levels -high levels of iron in the blood -magnetic resonance imaging (MRI) test scheduled -an unusual or allergic reaction to iron, other medicines, foods, dyes, or preservatives -pregnant or trying to get pregnant -breast-feeding How should I use this medicine? This medicine is for injection into a vein. It is given by a health care professional in a hospital or clinic setting. Talk to your pediatrician regarding the use of this medicine in children. Special care may be needed. What if I miss a dose? It is important not to miss your dose. Call your doctor or health care professional if you are unable to keep an appointment. What may interact with this medicine? This medicine may interact with the following medications: -other iron products What should I watch for while using this medicine? Visit your doctor or healthcare professional regularly. Tell your doctor or healthcare professional if your symptoms do not start to get better or if they get worse. You may need blood work done while you are taking this medicine. You may need to follow a special diet. Talk to your doctor. Foods that contain iron include: whole grains/cereals, dried fruits, beans, or peas, leafy green vegetables, and organ meats (liver, kidney). What side effects may I notice from receiving this medicine? Side effects that you should report to your doctor or health care professional as soon as possible: -allergic reactions like skin rash, itching or hives, swelling of the  face, lips, or tongue -breathing problems -changes in blood pressure -feeling faint or lightheaded, falls -fever or chills -flushing, sweating, or hot feelings -swelling of the ankles or feet Side effects that usually do not require medical attention (report to your doctor or health care professional if they continue or are bothersome): -diarrhea -headache -nausea, vomiting -stomach pain Where should I keep my medicine? This drug is given in a hospital or clinic and will not be stored at home.  2017 Elsevier/Gold Standard (2015-07-11 12:41:49)

## 2017-07-30 NOTE — Telephone Encounter (Signed)
Discussed ECHO results with Dr. Lovena Le.  Right atrium was moderately dilated, pumping function was normal.  Dr. Lovena Le suggested possible cardiac CT with contrast for further work up d/t dyspnea. Spoke with Pt, Pt with renal insufficiency, baseline Cr 1.6.  Per Pt she has been told to avoid any tests with contrast. Spoke with Dr. Lovena Le, d/t relatively normal ECHO results Pt should follow up with PCP, no further work up for dyspnea. Spoke with Pt.  Pt was concerned after last phone call.  Notified Pt that her ECHO was relatively normal.  She did have a moderately dilated Right atrium, however, if her DOE was stable she was ok to monitor at this time.  Pt has DOE, but Pt does not do any type of exercising d/t arthritis and back pain.  Pt states she can get around her house without sob.  Notified Pt to monitor sob, and if it increases to where she is sob walking within her home give [place] a call.  Pt indicates understanding.

## 2017-08-04 ENCOUNTER — Ambulatory Visit: Payer: Medicare Other

## 2017-08-06 ENCOUNTER — Inpatient Hospital Stay: Payer: Medicare Other

## 2017-08-06 VITALS — BP 139/58 | HR 64 | Temp 96.0°F | Resp 17

## 2017-08-06 DIAGNOSIS — N183 Chronic kidney disease, stage 3 unspecified: Secondary | ICD-10-CM

## 2017-08-06 DIAGNOSIS — N189 Chronic kidney disease, unspecified: Secondary | ICD-10-CM | POA: Diagnosis not present

## 2017-08-06 DIAGNOSIS — D631 Anemia in chronic kidney disease: Secondary | ICD-10-CM

## 2017-08-06 MED ORDER — SODIUM CHLORIDE 0.9 % IV SOLN
510.0000 mg | Freq: Once | INTRAVENOUS | Status: AC
  Administered 2017-08-06: 510 mg via INTRAVENOUS
  Filled 2017-08-06: qty 17

## 2017-08-06 MED ORDER — SODIUM CHLORIDE 0.9 % IV SOLN
Freq: Once | INTRAVENOUS | Status: AC
  Administered 2017-08-06: 14:00:00 via INTRAVENOUS

## 2017-08-10 ENCOUNTER — Other Ambulatory Visit: Payer: Self-pay | Admitting: Internal Medicine

## 2017-09-07 ENCOUNTER — Inpatient Hospital Stay: Payer: Medicare Other | Attending: Oncology

## 2017-09-07 ENCOUNTER — Other Ambulatory Visit: Payer: Self-pay | Admitting: *Deleted

## 2017-09-07 ENCOUNTER — Inpatient Hospital Stay: Payer: Medicare Other

## 2017-09-07 VITALS — BP 158/75 | HR 85 | Temp 97.7°F | Resp 20

## 2017-09-07 DIAGNOSIS — D631 Anemia in chronic kidney disease: Secondary | ICD-10-CM | POA: Diagnosis present

## 2017-09-07 DIAGNOSIS — N189 Chronic kidney disease, unspecified: Principal | ICD-10-CM

## 2017-09-07 DIAGNOSIS — N183 Chronic kidney disease, stage 3 unspecified: Secondary | ICD-10-CM

## 2017-09-07 LAB — CBC WITH DIFFERENTIAL (CANCER CENTER ONLY)
Basophils Absolute: 0 10*3/uL (ref 0.0–0.1)
Basophils Relative: 0 %
Eosinophils Absolute: 0 10*3/uL (ref 0.0–0.5)
Eosinophils Relative: 0 %
HCT: 29.6 % — ABNORMAL LOW (ref 34.8–46.6)
Hemoglobin: 9.1 g/dL — ABNORMAL LOW (ref 11.6–15.9)
Lymphocytes Relative: 9 %
Lymphs Abs: 0.7 10*3/uL — ABNORMAL LOW (ref 0.9–3.3)
MCH: 30 pg (ref 25.1–34.0)
MCHC: 30.7 g/dL — ABNORMAL LOW (ref 31.5–36.0)
MCV: 97.7 fL (ref 79.5–101.0)
Monocytes Absolute: 0.5 10*3/uL (ref 0.1–0.9)
Monocytes Relative: 6 %
Neutro Abs: 6.5 10*3/uL (ref 1.5–6.5)
Neutrophils Relative %: 85 %
Platelet Count: 270 10*3/uL (ref 145–400)
RBC: 3.03 MIL/uL — ABNORMAL LOW (ref 3.70–5.45)
RDW: 15.8 % — ABNORMAL HIGH (ref 11.2–14.5)
WBC Count: 7.7 10*3/uL (ref 3.9–10.3)

## 2017-09-07 MED ORDER — DARBEPOETIN ALFA 100 MCG/0.5ML IJ SOSY
100.0000 ug | PREFILLED_SYRINGE | Freq: Once | INTRAMUSCULAR | Status: AC
  Administered 2017-09-07: 100 ug via SUBCUTANEOUS
  Filled 2017-09-07: qty 0.5

## 2017-09-07 NOTE — Patient Instructions (Signed)
Darbepoetin Alfa injection What is this medicine? DARBEPOETIN ALFA (dar be POE e tin AL fa) helps your body make more red blood cells. It is used to treat anemia caused by chronic kidney failure and chemotherapy. This medicine may be used for other purposes; ask your health care provider or pharmacist if you have questions. What should I tell my health care provider before I take this medicine? They need to know if you have any of these conditions: -blood clotting disorders or history of blood clots -cancer patient not on chemotherapy -cystic fibrosis -heart disease, such as angina, heart failure, or a history of a heart attack -hemoglobin level of 12 g/dL or greater -high blood pressure -low levels of folate, iron, or vitamin B12 -seizures -an unusual or allergic reaction to darbepoetin, erythropoietin, albumin, hamster proteins, latex, other medicines, foods, dyes, or preservatives -pregnant or trying to get pregnant -breast-feeding How should I use this medicine? This medicine is for injection into a vein or under the skin. It is usually given by a health care professional in a hospital or clinic setting. If you get this medicine at home, you will be taught how to prepare and give this medicine. Do not shake the solution before you withdraw a dose. Use exactly as directed. Take your medicine at regular intervals. Do not take your medicine more often than directed. It is important that you put your used needles and syringes in a special sharps container. Do not put them in a trash can. If you do not have a sharps container, call your pharmacist or healthcare provider to get one. Talk to your pediatrician regarding the use of this medicine in children. While this medicine may be used in children as young as 1 year for selected conditions, precautions do apply. Overdosage: If you think you have taken too much of this medicine contact a poison control center or emergency room at once. NOTE:  This medicine is only for you. Do not share this medicine with others. What if I miss a dose? If you miss a dose, take it as soon as you can. If it is almost time for your next dose, take only that dose. Do not take double or extra doses. What may interact with this medicine? Do not take this medicine with any of the following medications: -epoetin alfa This list may not describe all possible interactions. Give your health care provider a list of all the medicines, herbs, non-prescription drugs, or dietary supplements you use. Also tell them if you smoke, drink alcohol, or use illegal drugs. Some items may interact with your medicine. What should I watch for while using this medicine? Visit your prescriber or health care professional for regular checks on your progress and for the needed blood tests and blood pressure measurements. It is especially important for the doctor to make sure your hemoglobin level is in the desired range, to limit the risk of potential side effects and to give you the best benefit. Keep all appointments for any recommended tests. Check your blood pressure as directed. Ask your doctor what your blood pressure should be and when you should contact him or her. As your body makes more red blood cells, you may need to take iron, folic acid, or vitamin B supplements. Ask your doctor or health care provider which products are right for you. If you have kidney disease continue dietary restrictions, even though this medication can make you feel better. Talk with your doctor or health care professional about the   foods you eat and the vitamins that you take. What side effects may I notice from receiving this medicine? Side effects that you should report to your doctor or health care professional as soon as possible: -allergic reactions like skin rash, itching or hives, swelling of the face, lips, or tongue -breathing problems -changes in vision -chest pain -confusion, trouble speaking  or understanding -feeling faint or lightheaded, falls -high blood pressure -muscle aches or pains -pain, swelling, warmth in the leg -rapid weight gain -severe headaches -sudden numbness or weakness of the face, arm or leg -trouble walking, dizziness, loss of balance or coordination -seizures (convulsions) -swelling of the ankles, feet, hands -unusually weak or tired Side effects that usually do not require medical attention (report to your doctor or health care professional if they continue or are bothersome): -diarrhea -fever, chills (flu-like symptoms) -headaches -nausea, vomiting -redness, stinging, or swelling at site where injected This list may not describe all possible side effects. Call your doctor for medical advice about side effects. You may report side effects to FDA at 1-800-FDA-1088. Where should I keep my medicine? Keep out of the reach of children. Store in a refrigerator between 2 and 8 degrees C (36 and 46 degrees F). Do not freeze. Do not shake. Throw away any unused portion if using a single-dose vial. Throw away any unused medicine after the expiration date. NOTE: This sheet is a summary. It may not cover all possible information. If you have questions about this medicine, talk to your doctor, pharmacist, or health care provider.    2016, Elsevier/[employer]. (2008-05-22 10:23:57)

## 2017-09-20 ENCOUNTER — Telehealth: Payer: Self-pay | Admitting: Internal Medicine

## 2017-09-20 NOTE — Telephone Encounter (Signed)
Patient is calling in regards to her prolia to see if it is time for her to be scheduled  Please advise

## 2017-09-21 ENCOUNTER — Ambulatory Visit (INDEPENDENT_AMBULATORY_CARE_PROVIDER_SITE_OTHER): Payer: Medicare Other | Admitting: *Deleted

## 2017-09-21 DIAGNOSIS — I442 Atrioventricular block, complete: Secondary | ICD-10-CM | POA: Diagnosis not present

## 2017-09-22 ENCOUNTER — Encounter: Payer: Self-pay | Admitting: Cardiology

## 2017-09-22 NOTE — Progress Notes (Signed)
Remote pacemaker transmission.   

## 2017-09-28 LAB — CUP PACEART REMOTE DEVICE CHECK
Battery Remaining Longevity: 91 mo
Battery Voltage: 3.01 V
Brady Statistic AP VP Percent: 73.08 %
Brady Statistic AP VS Percent: 3.52 %
Brady Statistic AS VP Percent: 7.86 %
Brady Statistic AS VS Percent: 15.55 %
Brady Statistic RA Percent Paced: 76.35 %
Brady Statistic RV Percent Paced: 80.79 %
Date Time Interrogation Session: 20190402180007
Implantable Lead Implant Date: 20161227
Implantable Lead Implant Date: 20161227
Implantable Lead Location: 753859
Implantable Lead Location: 753860
Implantable Lead Model: 5076
Implantable Lead Model: 5076
Implantable Pulse Generator Implant Date: 20161227
Lead Channel Impedance Value: 323 Ohm
Lead Channel Impedance Value: 361 Ohm
Lead Channel Impedance Value: 380 Ohm
Lead Channel Impedance Value: 418 Ohm
Lead Channel Pacing Threshold Amplitude: 0.75 V
Lead Channel Pacing Threshold Amplitude: 1 V
Lead Channel Pacing Threshold Pulse Width: 0.4 ms
Lead Channel Pacing Threshold Pulse Width: 0.4 ms
Lead Channel Sensing Intrinsic Amplitude: 1.625 mV
Lead Channel Sensing Intrinsic Amplitude: 1.625 mV
Lead Channel Sensing Intrinsic Amplitude: 11.25 mV
Lead Channel Sensing Intrinsic Amplitude: 11.25 mV
Lead Channel Setting Pacing Amplitude: 2 V
Lead Channel Setting Pacing Amplitude: 2.5 V
Lead Channel Setting Pacing Pulse Width: 0.4 ms
Lead Channel Setting Sensing Sensitivity: 2.8 mV

## 2017-10-13 ENCOUNTER — Telehealth: Payer: Self-pay

## 2017-10-13 NOTE — Telephone Encounter (Signed)
Spoke with patient today and she is getting Prolia from her PCP

## 2017-10-14 NOTE — Telephone Encounter (Signed)
Spoke with the patient regarding Prolia and she will get this from her PCP

## 2017-10-19 ENCOUNTER — Inpatient Hospital Stay: Payer: Medicare Other

## 2017-10-19 ENCOUNTER — Inpatient Hospital Stay: Payer: Medicare Other | Attending: Oncology | Admitting: Oncology

## 2017-10-19 ENCOUNTER — Other Ambulatory Visit: Payer: Self-pay | Admitting: *Deleted

## 2017-10-19 ENCOUNTER — Telehealth: Payer: Self-pay | Admitting: Oncology

## 2017-10-19 VITALS — BP 152/84

## 2017-10-19 VITALS — BP 186/64 | HR 71 | Temp 97.9°F | Resp 18 | Ht 58.5 in | Wt 263.3 lb

## 2017-10-19 DIAGNOSIS — N183 Chronic kidney disease, stage 3 unspecified: Secondary | ICD-10-CM

## 2017-10-19 DIAGNOSIS — N189 Chronic kidney disease, unspecified: Secondary | ICD-10-CM | POA: Diagnosis not present

## 2017-10-19 DIAGNOSIS — E1122 Type 2 diabetes mellitus with diabetic chronic kidney disease: Secondary | ICD-10-CM | POA: Diagnosis not present

## 2017-10-19 DIAGNOSIS — I4891 Unspecified atrial fibrillation: Secondary | ICD-10-CM | POA: Diagnosis not present

## 2017-10-19 DIAGNOSIS — D631 Anemia in chronic kidney disease: Secondary | ICD-10-CM

## 2017-10-19 DIAGNOSIS — Z862 Personal history of diseases of the blood and blood-forming organs and certain disorders involving the immune mechanism: Secondary | ICD-10-CM

## 2017-10-19 DIAGNOSIS — M3500 Sicca syndrome, unspecified: Secondary | ICD-10-CM | POA: Insufficient documentation

## 2017-10-19 LAB — CBC WITH DIFFERENTIAL (CANCER CENTER ONLY)
Basophils Absolute: 0 10*3/uL (ref 0.0–0.1)
Basophils Relative: 0 %
Eosinophils Absolute: 0 10*3/uL (ref 0.0–0.5)
Eosinophils Relative: 0 %
HCT: 25.2 % — ABNORMAL LOW (ref 34.8–46.6)
Hemoglobin: 8.2 g/dL — ABNORMAL LOW (ref 11.6–15.9)
Lymphocytes Relative: 7 %
Lymphs Abs: 0.5 10*3/uL — ABNORMAL LOW (ref 0.9–3.3)
MCH: 29.8 pg (ref 25.1–34.0)
MCHC: 32.5 g/dL (ref 31.5–36.0)
MCV: 91.6 fL (ref 79.5–101.0)
Monocytes Absolute: 0.4 10*3/uL (ref 0.1–0.9)
Monocytes Relative: 5 %
Neutro Abs: 6.4 10*3/uL (ref 1.5–6.5)
Neutrophils Relative %: 88 %
Platelet Count: 269 10*3/uL (ref 145–400)
RBC: 2.75 MIL/uL — ABNORMAL LOW (ref 3.70–5.45)
RDW: 15.4 % — ABNORMAL HIGH (ref 11.2–14.5)
WBC Count: 7.4 10*3/uL (ref 3.9–10.3)

## 2017-10-19 LAB — FERRITIN: Ferritin: 34 ng/mL (ref 9–269)

## 2017-10-19 LAB — IRON AND TIBC
Iron: 28 ug/dL — ABNORMAL LOW (ref 41–142)
Saturation Ratios: 9 % — ABNORMAL LOW (ref 21–57)
TIBC: 316 ug/dL (ref 236–444)
UIBC: 288 ug/dL

## 2017-10-19 MED ORDER — DARBEPOETIN ALFA 100 MCG/0.5ML IJ SOSY
100.0000 ug | PREFILLED_SYRINGE | Freq: Once | INTRAMUSCULAR | Status: AC
  Administered 2017-10-19: 100 ug via SUBCUTANEOUS
  Filled 2017-10-19: qty 0.5

## 2017-10-19 NOTE — Telephone Encounter (Signed)
Appointments scheduled AVS/Calendar printed per 4/30 los

## 2017-10-19 NOTE — Progress Notes (Signed)
  [facility] OFFICE PROGRESS NOTE   Diagnosis: Anemia, chronic renal failure  INTERVAL HISTORY:   Tricia Gonzales returns as scheduled.  She last received Aranesp on 09/07/2017.  She reports stable exertional dyspnea.  She is taking iron.  She received IV iron in February.  No bleeding.  Objective:  Vital signs in last 24 hours:  Blood pressure (!) 186/64, pulse 71, temperature 97.9 F (36.6 C), temperature source Oral, resp. rate 18, height 4' 10.5" (1.486 m), weight 263 lb 4.8 oz (119.4 kg), SpO2 95 %.    Resp: Inspiratory rhonchi at the lower posterior chest bilaterally Cardio: Regular rate and rhythm GI: No hepatosplenomegaly Vascular: Trace edema at the low leg bilaterally   Lab Results:  Lab Results  Component Value Date   WBC 7.4 10/19/2017   HGB 8.2 (L) 10/19/2017   HCT 25.2 (L) 10/19/2017   MCV 91.6 10/19/2017   PLT 269 10/19/2017   NEUTROABS 6.4 10/19/2017    CMP     Component Value Date/Time   NA 141 10/20/2016 1321   K 4.9 10/20/2016 1321   CL 103 08/28/2015 1617   CO2 28 10/20/2016 1321   GLUCOSE 179 (H) 10/20/2016 1321   BUN 57.3 (H) 10/20/2016 1321   CREATININE 1.6 (H) 10/20/2016 1321   CALCIUM 9.4 10/20/2016 1321   PROT 6.7 10/20/2016 1321   ALBUMIN 3.5 10/20/2016 1321   AST 15 10/20/2016 1321   ALT 10 10/20/2016 1321   ALKPHOS 58 10/20/2016 1321   BILITOT 0.35 10/20/2016 1321   GFRNONAA 43 (L) 08/13/2015 1211   GFRAA 49 (L) 08/13/2015 1211     Medications: I have reviewed the patient's current medications.   Assessment/Plan: 1. Normocytic anemia-chronic  normal ferritin, serum iron studies-low percent transferrin saturation, normal transferrin  Hemoccult positive stool February 2017, upper endoscopy 08/12/2015 with no source for bleeding  09/27/2015 erythropoietin 40,000 units weekly initiated  09/27/2015 erythropoietin level 48.3 (range 2.6-18.5)  10/11/2015 serum protein electrophoresis with no M spike observed; serum  IFE shows IgA monoclonal protein with lambda light chain specificity; quantitative immunoglobulins show IgG mildly decreased 654 (range [phone]), IgA and IgM in normal range; polyclonal serum light chain elevation  11/15/2015-hemoglobin 11.2  11/29/2015-hemoglobin 11.1  12/12/2005-hemoglobin 10.4; Aranesp 100 g every 2 weeks initiated  02/07/2016-hemoglobin 11.0, Aranesp held, changed to an every 3 week schedule  Progressive anemia and decreased iron stores December 2017, treated with IV iron January 2018 with improvement  She did not require Aranesp for 2 months and resumed Aranesp on a monthly schedule for 08/2016  Aranesp every 6 weeks beginning 05/04/2017  Progressive anemia and decreased iron stores 07/05/2017; IV iron  given 07/28/2017 and 08/06/2017  Persistent severe anemia, Aranesp change to every 2 weeks beginning 10/19/2017  2. Chronic renal insufficiency  3. Atrial fibrillation-maintained on apixaban  4. Osteoarthritis  5. Diabetes  6. Sjogren's disease  7. Pericarditis February 2017       Disposition: Tricia Gonzales appears unchanged.  She continues to have severe anemia.  We decided to shorten the Aranesp interval to 2 weeks in an attempt to raise the hemoglobin.  The goal hemoglobin is 10.  We will follow-up on iron studies from today.  She will return for an office visit in 6 weeks.  15 minutes were spent with the patient today.  The majority of the time was used for counseling and coordination of care.  [name], MD  10/19/2017  1:51 PM

## 2017-11-02 ENCOUNTER — Inpatient Hospital Stay: Payer: Medicare Other | Attending: Oncology

## 2017-11-02 ENCOUNTER — Inpatient Hospital Stay: Payer: Medicare Other

## 2017-11-02 VITALS — BP 158/62

## 2017-11-02 DIAGNOSIS — N189 Chronic kidney disease, unspecified: Principal | ICD-10-CM

## 2017-11-02 DIAGNOSIS — N183 Chronic kidney disease, stage 3 unspecified: Secondary | ICD-10-CM

## 2017-11-02 DIAGNOSIS — D631 Anemia in chronic kidney disease: Secondary | ICD-10-CM | POA: Insufficient documentation

## 2017-11-02 LAB — CBC WITH DIFFERENTIAL (CANCER CENTER ONLY)
Basophils Absolute: 0 10*3/uL (ref 0.0–0.1)
Basophils Relative: 0 %
Eosinophils Absolute: 0 10*3/uL (ref 0.0–0.5)
Eosinophils Relative: 0 %
HCT: 27.3 % — ABNORMAL LOW (ref 34.8–46.6)
Hemoglobin: 8.7 g/dL — ABNORMAL LOW (ref 11.6–15.9)
Lymphocytes Relative: 8 %
Lymphs Abs: 0.6 10*3/uL — ABNORMAL LOW (ref 0.9–3.3)
MCH: 29.3 pg (ref 25.1–34.0)
MCHC: 32 g/dL (ref 31.5–36.0)
MCV: 91.6 fL (ref 79.5–101.0)
Monocytes Absolute: 0.5 10*3/uL (ref 0.1–0.9)
Monocytes Relative: 7 %
Neutro Abs: 6.6 10*3/uL — ABNORMAL HIGH (ref 1.5–6.5)
Neutrophils Relative %: 85 %
Platelet Count: 284 10*3/uL (ref 145–400)
RBC: 2.98 MIL/uL — ABNORMAL LOW (ref 3.70–5.45)
RDW: 15.1 % — ABNORMAL HIGH (ref 11.2–14.5)
WBC Count: 7.8 10*3/uL (ref 3.9–10.3)

## 2017-11-02 MED ORDER — DARBEPOETIN ALFA 100 MCG/0.5ML IJ SOSY
100.0000 ug | PREFILLED_SYRINGE | Freq: Once | INTRAMUSCULAR | Status: AC
  Administered 2017-11-02: 100 ug via SUBCUTANEOUS
  Filled 2017-11-02: qty 0.5

## 2017-11-02 NOTE — Patient Instructions (Signed)
Darbepoetin Alfa injection What is this medicine? DARBEPOETIN ALFA (dar be POE e tin AL fa) helps your body make more red blood cells. It is used to treat anemia caused by chronic kidney failure and chemotherapy. This medicine may be used for other purposes; ask your health care provider or pharmacist if you have questions. What should I tell my health care provider before I take this medicine? They need to know if you have any of these conditions: -blood clotting disorders or history of blood clots -cancer patient not on chemotherapy -cystic fibrosis -heart disease, such as angina, heart failure, or a history of a heart attack -hemoglobin level of 12 g/dL or greater -high blood pressure -low levels of folate, iron, or vitamin B12 -seizures -an unusual or allergic reaction to darbepoetin, erythropoietin, albumin, hamster proteins, latex, other medicines, foods, dyes, or preservatives -pregnant or trying to get pregnant -breast-feeding How should I use this medicine? This medicine is for injection into a vein or under the skin. It is usually given by a health care professional in a hospital or clinic setting. If you get this medicine at home, you will be taught how to prepare and give this medicine. Do not shake the solution before you withdraw a dose. Use exactly as directed. Take your medicine at regular intervals. Do not take your medicine more often than directed. It is important that you put your used needles and syringes in a special sharps container. Do not put them in a trash can. If you do not have a sharps container, call your pharmacist or healthcare provider to get one. Talk to your pediatrician regarding the use of this medicine in children. While this medicine may be used in children as young as 1 year for selected conditions, precautions do apply. Overdosage: If you think you have taken too much of this medicine contact a poison control center or emergency room at once. NOTE:  This medicine is only for you. Do not share this medicine with others. What if I miss a dose? If you miss a dose, take it as soon as you can. If it is almost time for your next dose, take only that dose. Do not take double or extra doses. What may interact with this medicine? Do not take this medicine with any of the following medications: -epoetin alfa This list may not describe all possible interactions. Give your health care provider a list of all the medicines, herbs, non-prescription drugs, or dietary supplements you use. Also tell them if you smoke, drink alcohol, or use illegal drugs. Some items may interact with your medicine. What should I watch for while using this medicine? Visit your prescriber or health care professional for regular checks on your progress and for the needed blood tests and blood pressure measurements. It is especially important for the doctor to make sure your hemoglobin level is in the desired range, to limit the risk of potential side effects and to give you the best benefit. Keep all appointments for any recommended tests. Check your blood pressure as directed. Ask your doctor what your blood pressure should be and when you should contact him or her. As your body makes more red blood cells, you may need to take iron, folic acid, or vitamin B supplements. Ask your doctor or health care provider which products are right for you. If you have kidney disease continue dietary restrictions, even though this medication can make you feel better. Talk with your doctor or health care professional about the   foods you eat and the vitamins that you take. What side effects may I notice from receiving this medicine? Side effects that you should report to your doctor or health care professional as soon as possible: -allergic reactions like skin rash, itching or hives, swelling of the face, lips, or tongue -breathing problems -changes in vision -chest pain -confusion, trouble speaking  or understanding -feeling faint or lightheaded, falls -high blood pressure -muscle aches or pains -pain, swelling, warmth in the leg -rapid weight gain -severe headaches -sudden numbness or weakness of the face, arm or leg -trouble walking, dizziness, loss of balance or coordination -seizures (convulsions) -swelling of the ankles, feet, hands -unusually weak or tired Side effects that usually do not require medical attention (report to your doctor or health care professional if they continue or are bothersome): -diarrhea -fever, chills (flu-like symptoms) -headaches -nausea, vomiting -redness, stinging, or swelling at site where injected This list may not describe all possible side effects. Call your doctor for medical advice about side effects. You may report side effects to FDA at 1-800-FDA-1088. Where should I keep my medicine? Keep out of the reach of children. Store in a refrigerator between 2 and 8 degrees C (36 and 46 degrees F). Do not freeze. Do not shake. Throw away any unused portion if using a single-dose vial. Throw away any unused medicine after the expiration date. NOTE: This sheet is a summary. It may not cover all possible information. If you have questions about this medicine, talk to your doctor, pharmacist, or health care provider.    2016, Elsevier/[employer]. (2008-05-22 10:23:57)

## 2017-11-16 ENCOUNTER — Other Ambulatory Visit: Payer: Self-pay | Admitting: *Deleted

## 2017-11-16 ENCOUNTER — Inpatient Hospital Stay: Payer: Medicare Other

## 2017-11-16 ENCOUNTER — Inpatient Hospital Stay: Payer: Medicare Other | Admitting: *Deleted

## 2017-11-16 ENCOUNTER — Other Ambulatory Visit: Payer: Self-pay | Admitting: Oncology

## 2017-11-16 VITALS — BP 151/44 | HR 60 | Temp 98.2°F | Resp 17

## 2017-11-16 DIAGNOSIS — D631 Anemia in chronic kidney disease: Secondary | ICD-10-CM

## 2017-11-16 DIAGNOSIS — N189 Chronic kidney disease, unspecified: Principal | ICD-10-CM

## 2017-11-16 DIAGNOSIS — N183 Chronic kidney disease, stage 3 unspecified: Secondary | ICD-10-CM

## 2017-11-16 DIAGNOSIS — D649 Anemia, unspecified: Secondary | ICD-10-CM

## 2017-11-16 LAB — CBC WITH DIFFERENTIAL (CANCER CENTER ONLY)
Basophils Absolute: 0 10*3/uL (ref 0.0–0.1)
Basophils Relative: 0 %
Eosinophils Absolute: 0 10*3/uL (ref 0.0–0.5)
Eosinophils Relative: 0 %
HCT: 24.2 % — ABNORMAL LOW (ref 34.8–46.6)
Hemoglobin: 7.8 g/dL — ABNORMAL LOW (ref 11.6–15.9)
Lymphocytes Relative: 7 %
Lymphs Abs: 0.5 10*3/uL — ABNORMAL LOW (ref 0.9–3.3)
MCH: 29.2 pg (ref 25.1–34.0)
MCHC: 32.1 g/dL (ref 31.5–36.0)
MCV: 90.9 fL (ref 79.5–101.0)
Monocytes Absolute: 0.4 10*3/uL (ref 0.1–0.9)
Monocytes Relative: 5 %
Neutro Abs: 7.1 10*3/uL — ABNORMAL HIGH (ref 1.5–6.5)
Neutrophils Relative %: 88 %
Platelet Count: 283 10*3/uL (ref 145–400)
RBC: 2.67 MIL/uL — ABNORMAL LOW (ref 3.70–5.45)
RDW: 15.5 % — ABNORMAL HIGH (ref 11.2–14.5)
WBC Count: 8 10*3/uL (ref 3.9–10.3)

## 2017-11-16 LAB — SAMPLE TO BLOOD BANK

## 2017-11-16 MED ORDER — DARBEPOETIN ALFA 100 MCG/0.5ML IJ SOSY
100.0000 ug | PREFILLED_SYRINGE | Freq: Once | INTRAMUSCULAR | Status: AC
  Administered 2017-11-16: 100 ug via SUBCUTANEOUS
  Filled 2017-11-16: qty 0.5

## 2017-11-16 NOTE — Patient Instructions (Signed)
Darbepoetin Alfa injection What is this medicine? DARBEPOETIN ALFA (dar be POE e tin AL fa) helps your body make more red blood cells. It is used to treat anemia caused by chronic kidney failure and chemotherapy. This medicine may be used for other purposes; ask your health care provider or pharmacist if you have questions. What should I tell my health care provider before I take this medicine? They need to know if you have any of these conditions: -blood clotting disorders or history of blood clots -cancer patient not on chemotherapy -cystic fibrosis -heart disease, such as angina, heart failure, or a history of a heart attack -hemoglobin level of 12 g/dL or greater -high blood pressure -low levels of folate, iron, or vitamin B12 -seizures -an unusual or allergic reaction to darbepoetin, erythropoietin, albumin, hamster proteins, latex, other medicines, foods, dyes, or preservatives -pregnant or trying to get pregnant -breast-feeding How should I use this medicine? This medicine is for injection into a vein or under the skin. It is usually given by a health care professional in a hospital or clinic setting. If you get this medicine at home, you will be taught how to prepare and give this medicine. Do not shake the solution before you withdraw a dose. Use exactly as directed. Take your medicine at regular intervals. Do not take your medicine more often than directed. It is important that you put your used needles and syringes in a special sharps container. Do not put them in a trash can. If you do not have a sharps container, call your pharmacist or healthcare provider to get one. Talk to your pediatrician regarding the use of this medicine in children. While this medicine may be used in children as young as 1 year for selected conditions, precautions do apply. Overdosage: If you think you have taken too much of this medicine contact a poison control center or emergency room at once. NOTE:  This medicine is only for you. Do not share this medicine with others. What if I miss a dose? If you miss a dose, take it as soon as you can. If it is almost time for your next dose, take only that dose. Do not take double or extra doses. What may interact with this medicine? Do not take this medicine with any of the following medications: -epoetin alfa This list may not describe all possible interactions. Give your health care provider a list of all the medicines, herbs, non-prescription drugs, or dietary supplements you use. Also tell them if you smoke, drink alcohol, or use illegal drugs. Some items may interact with your medicine. What should I watch for while using this medicine? Visit your prescriber or health care professional for regular checks on your progress and for the needed blood tests and blood pressure measurements. It is especially important for the doctor to make sure your hemoglobin level is in the desired range, to limit the risk of potential side effects and to give you the best benefit. Keep all appointments for any recommended tests. Check your blood pressure as directed. Ask your doctor what your blood pressure should be and when you should contact him or her. As your body makes more red blood cells, you may need to take iron, folic acid, or vitamin B supplements. Ask your doctor or health care provider which products are right for you. If you have kidney disease continue dietary restrictions, even though this medication can make you feel better. Talk with your doctor or health care professional about the   foods you eat and the vitamins that you take. What side effects may I notice from receiving this medicine? Side effects that you should report to your doctor or health care professional as soon as possible: -allergic reactions like skin rash, itching or hives, swelling of the face, lips, or tongue -breathing problems -changes in vision -chest pain -confusion, trouble speaking  or understanding -feeling faint or lightheaded, falls -high blood pressure -muscle aches or pains -pain, swelling, warmth in the leg -rapid weight gain -severe headaches -sudden numbness or weakness of the face, arm or leg -trouble walking, dizziness, loss of balance or coordination -seizures (convulsions) -swelling of the ankles, feet, hands -unusually weak or tired Side effects that usually do not require medical attention (report to your doctor or health care professional if they continue or are bothersome): -diarrhea -fever, chills (flu-like symptoms) -headaches -nausea, vomiting -redness, stinging, or swelling at site where injected This list may not describe all possible side effects. Call your doctor for medical advice about side effects. You may report side effects to FDA at 1-800-FDA-1088. Where should I keep my medicine? Keep out of the reach of children. Store in a refrigerator between 2 and 8 degrees C (36 and 46 degrees F). Do not freeze. Do not shake. Throw away any unused portion if using a single-dose vial. Throw away any unused medicine after the expiration date. NOTE: This sheet is a summary. It may not cover all possible information. If you have questions about this medicine, talk to your doctor, pharmacist, or health care provider.    2016, Elsevier/[employer]. (2008-05-22 10:23:57)

## 2017-11-16 NOTE — Progress Notes (Signed)
Pt HGB today is 7.8 pts vitals look with in normal range for her but says she has been tired and short of breath. Notified Dr. Gearldine Shown desk nurse. Will be talking to Dr. Benay Spice about possible transfusion

## 2017-11-17 ENCOUNTER — Inpatient Hospital Stay: Payer: Medicare Other

## 2017-11-17 ENCOUNTER — Ambulatory Visit: Payer: Medicare Other

## 2017-11-17 DIAGNOSIS — N189 Chronic kidney disease, unspecified: Secondary | ICD-10-CM | POA: Diagnosis not present

## 2017-11-17 DIAGNOSIS — D631 Anemia in chronic kidney disease: Secondary | ICD-10-CM

## 2017-11-17 DIAGNOSIS — D649 Anemia, unspecified: Secondary | ICD-10-CM

## 2017-11-17 LAB — PREPARE RBC (CROSSMATCH)

## 2017-11-17 LAB — FERRITIN: Ferritin: 22 ng/mL (ref 9–269)

## 2017-11-17 LAB — CMP (CANCER CENTER ONLY)
ALT: 7 U/L (ref 0–55)
AST: 13 U/L (ref 5–34)
Albumin: 3.4 g/dL — ABNORMAL LOW (ref 3.5–5.0)
Alkaline Phosphatase: 53 U/L (ref 40–150)
Anion gap: 10 (ref 3–11)
BUN: 75 mg/dL — ABNORMAL HIGH (ref 7–26)
CO2: 28 mmol/L (ref 22–29)
Calcium: 9.2 mg/dL (ref 8.4–10.4)
Chloride: 103 mmol/L (ref 98–109)
Creatinine: 1.87 mg/dL — ABNORMAL HIGH (ref 0.60–1.10)
GFR, Est AFR Am: 29 mL/min — ABNORMAL LOW (ref >60)
GFR, Estimated: 25 mL/min — ABNORMAL LOW (ref >60)
Glucose, Bld: 179 mg/dL — ABNORMAL HIGH (ref 70–140)
Potassium: 4.9 mmol/L (ref 3.5–5.1)
Sodium: 141 mmol/L (ref 136–145)
Total Bilirubin: 0.3 mg/dL (ref 0.2–1.2)
Total Protein: 6.3 g/dL — ABNORMAL LOW (ref 6.4–8.3)

## 2017-11-17 LAB — IRON AND TIBC
Iron: 31 ug/dL — ABNORMAL LOW (ref 41–142)
Saturation Ratios: 9 % — ABNORMAL LOW (ref 21–57)
TIBC: 343 ug/dL (ref 236–444)
UIBC: 312 ug/dL

## 2017-11-17 LAB — ABO/RH: ABO/RH(D): A POS

## 2017-11-17 MED ORDER — SODIUM CHLORIDE 0.9 % IV SOLN
250.0000 mL | Freq: Once | INTRAVENOUS | Status: AC
  Administered 2017-11-17: 250 mL via INTRAVENOUS

## 2017-11-17 NOTE — Patient Instructions (Signed)

## 2017-11-18 LAB — BPAM RBC
Blood Product Expiration Date: 201906112359
ISSUE DATE / TIME: 201905291603
Unit Type and Rh: 6200

## 2017-11-18 LAB — TYPE AND SCREEN
ABO/RH(D): A POS
Antibody Screen: NEGATIVE
Unit division: 0

## 2017-11-19 NOTE — Progress Notes (Signed)
Patient advised lab results and new order for iron. Confirmed appts for June.

## 2017-11-30 ENCOUNTER — Encounter: Payer: Self-pay | Admitting: Nurse Practitioner

## 2017-11-30 ENCOUNTER — Inpatient Hospital Stay: Payer: Medicare Other

## 2017-11-30 ENCOUNTER — Inpatient Hospital Stay (HOSPITAL_BASED_OUTPATIENT_CLINIC_OR_DEPARTMENT_OTHER): Payer: Medicare Other | Admitting: Nurse Practitioner

## 2017-11-30 ENCOUNTER — Telehealth: Payer: Self-pay | Admitting: Oncology

## 2017-11-30 ENCOUNTER — Inpatient Hospital Stay: Payer: Medicare Other | Attending: Oncology

## 2017-11-30 VITALS — BP 146/63 | HR 61 | Temp 98.0°F | Resp 19 | Ht 58.5 in | Wt 262.2 lb

## 2017-11-30 DIAGNOSIS — D631 Anemia in chronic kidney disease: Secondary | ICD-10-CM | POA: Insufficient documentation

## 2017-11-30 DIAGNOSIS — E1122 Type 2 diabetes mellitus with diabetic chronic kidney disease: Secondary | ICD-10-CM | POA: Diagnosis not present

## 2017-11-30 DIAGNOSIS — N183 Chronic kidney disease, stage 3 unspecified: Secondary | ICD-10-CM

## 2017-11-30 DIAGNOSIS — N189 Chronic kidney disease, unspecified: Secondary | ICD-10-CM

## 2017-11-30 DIAGNOSIS — D509 Iron deficiency anemia, unspecified: Secondary | ICD-10-CM | POA: Insufficient documentation

## 2017-11-30 LAB — CBC WITH DIFFERENTIAL (CANCER CENTER ONLY)
Basophils Absolute: 0 10*3/uL (ref 0.0–0.1)
Basophils Relative: 0 %
Eosinophils Absolute: 0 10*3/uL (ref 0.0–0.5)
Eosinophils Relative: 0 %
HCT: 26.5 % — ABNORMAL LOW (ref 34.8–46.6)
Hemoglobin: 8.6 g/dL — ABNORMAL LOW (ref 11.6–15.9)
Lymphocytes Relative: 8 %
Lymphs Abs: 0.5 10*3/uL — ABNORMAL LOW (ref 0.9–3.3)
MCH: 29.2 pg (ref 25.1–34.0)
MCHC: 32.5 g/dL (ref 31.5–36.0)
MCV: 89.8 fL (ref 79.5–101.0)
Monocytes Absolute: 0.5 10*3/uL (ref 0.1–0.9)
Monocytes Relative: 7 %
Neutro Abs: 5.6 10*3/uL (ref 1.5–6.5)
Neutrophils Relative %: 85 %
Platelet Count: 282 10*3/uL (ref 145–400)
RBC: 2.95 MIL/uL — ABNORMAL LOW (ref 3.70–5.45)
RDW: 15.9 % — ABNORMAL HIGH (ref 11.2–14.5)
WBC Count: 6.6 10*3/uL (ref 3.9–10.3)

## 2017-11-30 MED ORDER — DARBEPOETIN ALFA 200 MCG/0.4ML IJ SOSY
PREFILLED_SYRINGE | INTRAMUSCULAR | Status: AC
  Filled 2017-11-30: qty 0.4

## 2017-11-30 MED ORDER — DARBEPOETIN ALFA 200 MCG/0.4ML IJ SOSY
200.0000 ug | PREFILLED_SYRINGE | Freq: Once | INTRAMUSCULAR | Status: AC
  Administered 2017-11-30: 200 ug via SUBCUTANEOUS

## 2017-11-30 NOTE — Progress Notes (Signed)
  [facility] OFFICE PROGRESS NOTE   Diagnosis: Anemia, chronic renal failure  INTERVAL HISTORY:   Tricia Gonzales returns as scheduled.  She continues Aranesp every 2 weeks, last injection 11/16/2017.  She is taking ferrous sulfate 3 times a day.  She denies bleeding.  Energy level remains poor.  She has dyspnea on exertion.  She notes loose stools which she attributes to oral iron.  Objective:  Vital signs in last 24 hours:  Blood pressure (!) 146/63, pulse 61, temperature 98 F (36.7 C), temperature source Oral, resp. rate 19, height 4' 10.5" (1.486 m), weight 262 lb 3.2 oz (118.9 kg), SpO2 97 %.   Resp: Inspiratory rhonchi at the lower lung fields bilaterally. Cardio: Regular rate and rhythm. GI: Abdomen soft and nontender. Vascular: Trace edema at the lower legs bilaterally.    Lab Results:  Lab Results  Component Value Date   WBC 6.6 11/30/2017   HGB 8.6 (L) 11/30/2017   HCT 26.5 (L) 11/30/2017   MCV 89.8 11/30/2017   PLT 282 11/30/2017   NEUTROABS 5.6 11/30/2017    Imaging:  No results found.  Medications: I have reviewed the patient's current medications.  Assessment/Plan: 1. 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; quantitative immunoglobulins show IgG mildly decreased 654 (range [phone]), IgA and IgM in normal range; polyclonal serum light chain elevation  11/15/2015-hemoglobin 11.2  11/29/2015-hemoglobin 11.1  12/12/2005-hemoglobin 10.4; Aranesp 100 g every 2 weeks initiated  02/07/2016-hemoglobin 11.0, Aranesp  held, changed to an every 3 week schedule  Progressive anemia and decreased iron stores December 2017, treated with IV iron January 2018 with improvement  She did not require Aranesp for 2 months and resumed Aranesp on a monthly schedule for 08/2016  Aranesp every 6 weeks beginning 05/04/2017  Progressive anemia and decreased iron stores 07/05/2017; IV iron given 07/28/2017 and 08/06/2017  Persistent severe anemia, Aranesp change to every 2 weeks beginning 10/19/2017  Persistent anemia, decreased iron stores 11/30/2017  2. Chronic renal insufficiency  3. Atrial fibrillation-maintained on apixaban  4. Osteoarthritis  5. Diabetes  6. Sjogren's disease  7. Pericarditis February 2017    Disposition: Tricia Gonzales appears unchanged.  She continues to have severe anemia.  Plan to continue Aranesp every 2 weeks.  We reviewed the iron studies from 11/17/2017.  We are making arrangements for IV iron.  She will return for lab and follow-up in 8 weeks.  She will contact the office in the interim with any problems.  Plan reviewed with Dr. Benay Spice.    Ned Card ANP/GNP-BC   11/30/2017  12:27 PM

## 2017-11-30 NOTE — Telephone Encounter (Signed)
Appointments scheduled AVS/Calendar printed per 6/11 los

## 2017-12-10 ENCOUNTER — Other Ambulatory Visit: Payer: Self-pay | Admitting: Nurse Practitioner

## 2017-12-10 DIAGNOSIS — D649 Anemia, unspecified: Secondary | ICD-10-CM

## 2017-12-13 ENCOUNTER — Other Ambulatory Visit: Payer: Self-pay | Admitting: Oncology

## 2017-12-13 ENCOUNTER — Inpatient Hospital Stay: Payer: Medicare Other

## 2017-12-13 VITALS — BP 163/46 | HR 60 | Temp 98.0°F | Resp 17

## 2017-12-13 DIAGNOSIS — D649 Anemia, unspecified: Secondary | ICD-10-CM

## 2017-12-13 DIAGNOSIS — N189 Chronic kidney disease, unspecified: Secondary | ICD-10-CM | POA: Diagnosis not present

## 2017-12-13 DIAGNOSIS — N183 Chronic kidney disease, stage 3 unspecified: Secondary | ICD-10-CM

## 2017-12-13 DIAGNOSIS — D631 Anemia in chronic kidney disease: Secondary | ICD-10-CM

## 2017-12-13 LAB — CBC WITH DIFFERENTIAL (CANCER CENTER ONLY)
Basophils Absolute: 0 10*3/uL (ref 0.0–0.1)
Basophils Relative: 0 %
Eosinophils Absolute: 0 10*3/uL (ref 0.0–0.5)
Eosinophils Relative: 0 %
HCT: 26.7 % — ABNORMAL LOW (ref 34.8–46.6)
Hemoglobin: 8.6 g/dL — ABNORMAL LOW (ref 11.6–15.9)
Lymphocytes Relative: 6 %
Lymphs Abs: 0.5 10*3/uL — ABNORMAL LOW (ref 0.9–3.3)
MCH: 28.9 pg (ref 25.1–34.0)
MCHC: 32.2 g/dL (ref 31.5–36.0)
MCV: 89.7 fL (ref 79.5–101.0)
Monocytes Absolute: 0.6 10*3/uL (ref 0.1–0.9)
Monocytes Relative: 7 %
Neutro Abs: 7.2 10*3/uL — ABNORMAL HIGH (ref 1.5–6.5)
Neutrophils Relative %: 87 %
Platelet Count: 277 10*3/uL (ref 145–400)
RBC: 2.98 MIL/uL — ABNORMAL LOW (ref 3.70–5.45)
RDW: 15.8 % — ABNORMAL HIGH (ref 11.2–14.5)
WBC Count: 8.3 10*3/uL (ref 3.9–10.3)

## 2017-12-13 MED ORDER — SODIUM CHLORIDE 0.9 % IV SOLN
510.0000 mg | Freq: Once | INTRAVENOUS | Status: AC
  Administered 2017-12-13: 510 mg via INTRAVENOUS
  Filled 2017-12-13: qty 17

## 2017-12-13 MED ORDER — SODIUM CHLORIDE 0.9 % IV SOLN
Freq: Once | INTRAVENOUS | Status: AC
  Administered 2017-12-13: 11:00:00 via INTRAVENOUS

## 2017-12-13 NOTE — Progress Notes (Signed)
Patient declined to stay for entire 33mn observation period. Denies concerns or complaints on exit.

## 2017-12-13 NOTE — Patient Instructions (Signed)

## 2017-12-14 ENCOUNTER — Inpatient Hospital Stay: Payer: Medicare Other

## 2017-12-14 VITALS — BP 158/56 | HR 81 | Temp 97.8°F | Resp 18

## 2017-12-14 DIAGNOSIS — N183 Chronic kidney disease, stage 3 unspecified: Secondary | ICD-10-CM

## 2017-12-14 DIAGNOSIS — N189 Chronic kidney disease, unspecified: Principal | ICD-10-CM

## 2017-12-14 DIAGNOSIS — D631 Anemia in chronic kidney disease: Secondary | ICD-10-CM

## 2017-12-14 MED ORDER — DARBEPOETIN ALFA 200 MCG/0.4ML IJ SOSY
200.0000 ug | PREFILLED_SYRINGE | Freq: Once | INTRAMUSCULAR | Status: AC
  Administered 2017-12-14: 200 ug via SUBCUTANEOUS

## 2017-12-14 NOTE — Patient Instructions (Signed)
Darbepoetin Alfa injection What is this medicine? DARBEPOETIN ALFA (dar be POE e tin AL fa) helps your body make more red blood cells. It is used to treat anemia caused by chronic kidney failure and chemotherapy. This medicine may be used for other purposes; ask your health care provider or pharmacist if you have questions. What should I tell my health care provider before I take this medicine? They need to know if you have any of these conditions: -blood clotting disorders or history of blood clots -cancer patient not on chemotherapy -cystic fibrosis -heart disease, such as angina, heart failure, or a history of a heart attack -hemoglobin level of 12 g/dL or greater -high blood pressure -low levels of folate, iron, or vitamin B12 -seizures -an unusual or allergic reaction to darbepoetin, erythropoietin, albumin, hamster proteins, latex, other medicines, foods, dyes, or preservatives -pregnant or trying to get pregnant -breast-feeding How should I use this medicine? This medicine is for injection into a vein or under the skin. It is usually given by a health care professional in a hospital or clinic setting. If you get this medicine at home, you will be taught how to prepare and give this medicine. Do not shake the solution before you withdraw a dose. Use exactly as directed. Take your medicine at regular intervals. Do not take your medicine more often than directed. It is important that you put your used needles and syringes in a special sharps container. Do not put them in a trash can. If you do not have a sharps container, call your pharmacist or healthcare provider to get one. Talk to your pediatrician regarding the use of this medicine in children. While this medicine may be used in children as young as 1 year for selected conditions, precautions do apply. Overdosage: If you think you have taken too much of this medicine contact a poison control center or emergency room at once. NOTE:  This medicine is only for you. Do not share this medicine with others. What if I miss a dose? If you miss a dose, take it as soon as you can. If it is almost time for your next dose, take only that dose. Do not take double or extra doses. What may interact with this medicine? Do not take this medicine with any of the following medications: -epoetin alfa This list may not describe all possible interactions. Give your health care provider a list of all the medicines, herbs, non-prescription drugs, or dietary supplements you use. Also tell them if you smoke, drink alcohol, or use illegal drugs. Some items may interact with your medicine. What should I watch for while using this medicine? Visit your prescriber or health care professional for regular checks on your progress and for the needed blood tests and blood pressure measurements. It is especially important for the doctor to make sure your hemoglobin level is in the desired range, to limit the risk of potential side effects and to give you the best benefit. Keep all appointments for any recommended tests. Check your blood pressure as directed. Ask your doctor what your blood pressure should be and when you should contact him or her. As your body makes more red blood cells, you may need to take iron, folic acid, or vitamin B supplements. Ask your doctor or health care provider which products are right for you. If you have kidney disease continue dietary restrictions, even though this medication can make you feel better. Talk with your doctor or health care professional about the   foods you eat and the vitamins that you take. What side effects may I notice from receiving this medicine? Side effects that you should report to your doctor or health care professional as soon as possible: -allergic reactions like skin rash, itching or hives, swelling of the face, lips, or tongue -breathing problems -changes in vision -chest pain -confusion, trouble speaking  or understanding -feeling faint or lightheaded, falls -high blood pressure -muscle aches or pains -pain, swelling, warmth in the leg -rapid weight gain -severe headaches -sudden numbness or weakness of the face, arm or leg -trouble walking, dizziness, loss of balance or coordination -seizures (convulsions) -swelling of the ankles, feet, hands -unusually weak or tired Side effects that usually do not require medical attention (report to your doctor or health care professional if they continue or are bothersome): -diarrhea -fever, chills (flu-like symptoms) -headaches -nausea, vomiting -redness, stinging, or swelling at site where injected This list may not describe all possible side effects. Call your doctor for medical advice about side effects. You may report side effects to FDA at 1-800-FDA-1088. Where should I keep my medicine? Keep out of the reach of children. Store in a refrigerator between 2 and 8 degrees C (36 and 46 degrees F). Do not freeze. Do not shake. Throw away any unused portion if using a single-dose vial. Throw away any unused medicine after the expiration date. NOTE: This sheet is a summary. It may not cover all possible information. If you have questions about this medicine, talk to your doctor, pharmacist, or health care provider.    2016, Elsevier/[employer]. (2008-05-22 10:23:57)

## 2017-12-17 ENCOUNTER — Other Ambulatory Visit: Payer: Self-pay | Admitting: *Deleted

## 2017-12-17 DIAGNOSIS — D649 Anemia, unspecified: Secondary | ICD-10-CM

## 2017-12-17 NOTE — Progress Notes (Signed)
cbc

## 2017-12-20 ENCOUNTER — Inpatient Hospital Stay: Payer: Medicare Other

## 2017-12-20 ENCOUNTER — Inpatient Hospital Stay: Payer: Medicare Other | Attending: Oncology

## 2017-12-20 VITALS — BP 164/56 | HR 66 | Temp 98.2°F | Resp 18

## 2017-12-20 DIAGNOSIS — D649 Anemia, unspecified: Secondary | ICD-10-CM

## 2017-12-20 DIAGNOSIS — D509 Iron deficiency anemia, unspecified: Secondary | ICD-10-CM | POA: Insufficient documentation

## 2017-12-20 DIAGNOSIS — N189 Chronic kidney disease, unspecified: Secondary | ICD-10-CM | POA: Insufficient documentation

## 2017-12-20 DIAGNOSIS — N183 Chronic kidney disease, stage 3 unspecified: Secondary | ICD-10-CM

## 2017-12-20 DIAGNOSIS — D631 Anemia in chronic kidney disease: Secondary | ICD-10-CM | POA: Diagnosis present

## 2017-12-20 LAB — CMP (CANCER CENTER ONLY)
ALT: 7 U/L (ref 0–44)
AST: 13 U/L — ABNORMAL LOW (ref 15–41)
Albumin: 3.3 g/dL — ABNORMAL LOW (ref 3.5–5.0)
Alkaline Phosphatase: 61 U/L (ref 38–126)
Anion gap: 4 — ABNORMAL LOW (ref 5–15)
BUN: 43 mg/dL — ABNORMAL HIGH (ref 8–23)
CO2: 31 mmol/L (ref 22–32)
Calcium: 8.7 mg/dL — ABNORMAL LOW (ref 8.9–10.3)
Chloride: 107 mmol/L (ref 98–111)
Creatinine: 1.43 mg/dL — ABNORMAL HIGH (ref 0.44–1.00)
GFR, Est AFR Am: 40 mL/min — ABNORMAL LOW (ref >60)
GFR, Estimated: 35 mL/min — ABNORMAL LOW (ref >60)
Glucose, Bld: 142 mg/dL — ABNORMAL HIGH (ref 70–99)
Potassium: 4.5 mmol/L (ref 3.5–5.1)
Sodium: 142 mmol/L (ref 135–145)
Total Bilirubin: 0.3 mg/dL (ref 0.3–1.2)
Total Protein: 6 g/dL — ABNORMAL LOW (ref 6.5–8.1)

## 2017-12-20 LAB — CBC WITH DIFFERENTIAL (CANCER CENTER ONLY)
Basophils Absolute: 0 10*3/uL (ref 0.0–0.1)
Basophils Relative: 0 %
Eosinophils Absolute: 0 10*3/uL (ref 0.0–0.5)
Eosinophils Relative: 0 %
HCT: 29.8 % — ABNORMAL LOW (ref 34.8–46.6)
Hemoglobin: 8.8 g/dL — ABNORMAL LOW (ref 11.6–15.9)
Lymphocytes Relative: 9 %
Lymphs Abs: 0.7 10*3/uL — ABNORMAL LOW (ref 0.9–3.3)
MCH: 28.9 pg (ref 25.1–34.0)
MCHC: 29.5 g/dL — ABNORMAL LOW (ref 31.5–36.0)
MCV: 97.7 fL (ref 79.5–101.0)
Monocytes Absolute: 0.4 10*3/uL (ref 0.1–0.9)
Monocytes Relative: 5 %
Neutro Abs: 7 10*3/uL — ABNORMAL HIGH (ref 1.5–6.5)
Neutrophils Relative %: 86 %
Platelet Count: 280 10*3/uL (ref 145–400)
RBC: 3.05 MIL/uL — ABNORMAL LOW (ref 3.70–5.45)
RDW: 16.7 % — ABNORMAL HIGH (ref 11.2–14.5)
WBC Count: 8.1 10*3/uL (ref 3.9–10.3)

## 2017-12-20 MED ORDER — SODIUM CHLORIDE 0.9 % IV SOLN
510.0000 mg | Freq: Once | INTRAVENOUS | Status: AC
  Administered 2017-12-20: 510 mg via INTRAVENOUS
  Filled 2017-12-20: qty 17

## 2017-12-20 MED ORDER — SODIUM CHLORIDE 0.9 % IV SOLN
Freq: Once | INTRAVENOUS | Status: AC
  Administered 2017-12-20: 13:00:00 via INTRAVENOUS

## 2017-12-20 NOTE — Patient Instructions (Signed)
Ferumoxytol injection What is this medicine? FERUMOXYTOL is an iron complex. Iron is used to make healthy red blood cells, which carry oxygen and nutrients throughout the body. This medicine is used to treat iron deficiency anemia in people with chronic kidney disease. This medicine may be used for other purposes; ask your health care provider or pharmacist if you have questions. COMMON BRAND NAME(S): Feraheme What should I tell my health care provider before I take this medicine? They need to know if you have any of these conditions: -anemia not caused by low iron levels -high levels of iron in the blood -magnetic resonance imaging (MRI) test scheduled -an unusual or allergic reaction to iron, other medicines, foods, dyes, or preservatives -pregnant or trying to get pregnant -breast-feeding How should I use this medicine? This medicine is for injection into a vein. It is given by a health care professional in a hospital or clinic setting. Talk to your pediatrician regarding the use of this medicine in children. Special care may be needed. Overdosage: If you think you have taken too much of this medicine contact a poison control center or emergency room at once. NOTE: This medicine is only for you. Do not share this medicine with others. What if I miss a dose? It is important not to miss your dose. Call your doctor or health care professional if you are unable to keep an appointment. What may interact with this medicine? This medicine may interact with the following medications: -other iron products This list may not describe all possible interactions. Give your health care provider a list of all the medicines, herbs, non-prescription drugs, or dietary supplements you use. Also tell them if you smoke, drink alcohol, or use illegal drugs. Some items may interact with your medicine. What should I watch for while using this medicine? Visit your doctor or healthcare professional regularly. Tell  your doctor or healthcare professional if your symptoms do not start to get better or if they get worse. You may need blood work done while you are taking this medicine. You may need to follow a special diet. Talk to your doctor. Foods that contain iron include: whole grains/cereals, dried fruits, beans, or peas, leafy green vegetables, and organ meats (liver, kidney). What side effects may I notice from receiving this medicine? Side effects that you should report to your doctor or health care professional as soon as possible: -allergic reactions like skin rash, itching or hives, swelling of the face, lips, or tongue -breathing problems -changes in blood pressure -feeling faint or lightheaded, falls -fever or chills -flushing, sweating, or hot feelings -swelling of the ankles or feet Side effects that usually do not require medical attention (report to your doctor or health care professional if they continue or are bothersome): -diarrhea -headache -nausea, vomiting -stomach pain This list may not describe all possible side effects. Call your doctor for medical advice about side effects. You may report side effects to FDA at 1-800-FDA-1088. Where should I keep my medicine? This drug is given in a hospital or clinic and will not be stored at home. NOTE: This sheet is a summary. It may not cover all possible information. If you have questions about this medicine, talk to your doctor, pharmacist, or health care provider.  2018 Elsevier/Gold Standard (2015-07-11 12:41:49)   Diarrhea, Adult Diarrhea is when you have loose and water poop (stool) often. Diarrhea can make you feel weak and cause you to get dehydrated. Dehydration can make you tired and thirsty, make you  have a dry mouth, and make it so you pee (urinate) less often. Diarrhea often lasts 2-3 days. However, it can last longer if it is a sign of something more serious. It is important to treat your diarrhea as told by your  doctor. Follow these instructions at home: Eating and drinking  Follow these recommendations as told by your doctor:  Take an oral rehydration solution (ORS). This is a drink that is sold at pharmacies and stores.  Drink clear fluids, such as: ? Water. ? Ice chips. ? Diluted fruit juice. ? Low-calorie sports drinks.  Eat bland, easy-to-digest foods in small amounts as you are able. These foods include: ? Bananas. ? Applesauce. ? Rice. ? Low-fat (lean) meats. ? Toast. ? Crackers.  Avoid drinking fluids that have a lot of sugar or caffeine in them.  Avoid alcohol.  Avoid spicy or fatty foods.  General instructions   Drink enough fluid to keep your pee (urine) clear or pale yellow.  Wash your hands often. If you cannot use soap and water, use hand sanitizer.  Make sure that all people in your home wash their hands well and often.  Take over-the-counter and prescription medicines only as told by your doctor.  Rest at home while you get better.  Watch your condition for any changes.  Take a warm bath to help with any burning or pain from having diarrhea.  Keep all follow-up visits as told by your doctor. This is important. Contact a doctor if:  You have a fever.  Your diarrhea gets worse.  You have new symptoms.  You cannot keep fluids down.  You feel light-headed or dizzy.  You have a headache.  You have muscle cramps. Get help right away if:  You have chest pain.  You feel very weak or you pass out (faint).  You have bloody or black poop or poop that look like tar.  You have very bad pain, cramping, or bloating in your belly (abdomen).  You have trouble breathing or you are breathing very quickly.  Your heart is beating very quickly.  Your skin feels cold and clammy.  You feel confused.  You have signs of dehydration, such as: ? Dark pee, hardly any pee, or no pee. ? Cracked lips. ? Dry mouth. ? Sunken  eyes. ? Sleepiness. ? Weakness. This information is not intended to replace advice given to you by your health care provider. Make sure you discuss any questions you have with your health care provider. Document Released: 11/25/2007 Document Revised: 12/27/2015 Document Reviewed: 02/12/2015 Elsevier Interactive Patient Education  2018 [employer].

## 2017-12-21 ENCOUNTER — Encounter: Payer: Medicare Other | Admitting: *Deleted

## 2017-12-21 ENCOUNTER — Telehealth: Payer: Self-pay | Admitting: Cardiology

## 2017-12-21 NOTE — Telephone Encounter (Signed)
Spoke with pt and reminded pt of remote transmission that is due today. Pt verbalized understanding.   

## 2017-12-22 ENCOUNTER — Encounter: Payer: Self-pay | Admitting: Cardiology

## 2017-12-28 ENCOUNTER — Ambulatory Visit (INDEPENDENT_AMBULATORY_CARE_PROVIDER_SITE_OTHER): Payer: Medicare Other | Admitting: *Deleted

## 2017-12-28 ENCOUNTER — Other Ambulatory Visit: Payer: Self-pay | Admitting: Nurse Practitioner

## 2017-12-28 ENCOUNTER — Inpatient Hospital Stay: Payer: Medicare Other

## 2017-12-28 VITALS — BP 136/51 | HR 60 | Temp 97.9°F | Resp 20

## 2017-12-28 DIAGNOSIS — D631 Anemia in chronic kidney disease: Secondary | ICD-10-CM

## 2017-12-28 DIAGNOSIS — I442 Atrioventricular block, complete: Secondary | ICD-10-CM | POA: Diagnosis not present

## 2017-12-28 DIAGNOSIS — N183 Chronic kidney disease, stage 3 unspecified: Secondary | ICD-10-CM

## 2017-12-28 DIAGNOSIS — N189 Chronic kidney disease, unspecified: Secondary | ICD-10-CM | POA: Diagnosis not present

## 2017-12-28 LAB — CBC WITH DIFFERENTIAL (CANCER CENTER ONLY)
Basophils Absolute: 0 10*3/uL (ref 0.0–0.1)
Basophils Relative: 0 %
Eosinophils Absolute: 0 10*3/uL (ref 0.0–0.5)
Eosinophils Relative: 0 %
HCT: 29.2 % — ABNORMAL LOW (ref 34.8–46.6)
Hemoglobin: 9.6 g/dL — ABNORMAL LOW (ref 11.6–15.9)
Lymphocytes Relative: 8 %
Lymphs Abs: 0.5 10*3/uL — ABNORMAL LOW (ref 0.9–3.3)
MCH: 30.1 pg (ref 25.1–34.0)
MCHC: 32.8 g/dL (ref 31.5–36.0)
MCV: 91.8 fL (ref 79.5–101.0)
Monocytes Absolute: 0.5 10*3/uL (ref 0.1–0.9)
Monocytes Relative: 8 %
Neutro Abs: 5.5 10*3/uL (ref 1.5–6.5)
Neutrophils Relative %: 84 %
Platelet Count: 267 10*3/uL (ref 145–400)
RBC: 3.18 MIL/uL — ABNORMAL LOW (ref 3.70–5.45)
RDW: 18.1 % — ABNORMAL HIGH (ref 11.2–14.5)
WBC Count: 6.5 10*3/uL (ref 3.9–10.3)

## 2017-12-28 MED ORDER — DARBEPOETIN ALFA 200 MCG/0.4ML IJ SOSY
200.0000 ug | PREFILLED_SYRINGE | Freq: Once | INTRAMUSCULAR | Status: AC
  Administered 2017-12-28: 200 ug via SUBCUTANEOUS

## 2017-12-28 NOTE — Patient Instructions (Signed)
Darbepoetin Alfa injection What is this medicine? DARBEPOETIN ALFA (dar be POE e tin AL fa) helps your body make more red blood cells. It is used to treat anemia caused by chronic kidney failure and chemotherapy. This medicine may be used for other purposes; ask your health care provider or pharmacist if you have questions. What should I tell my health care provider before I take this medicine? They need to know if you have any of these conditions: -blood clotting disorders or history of blood clots -cancer patient not on chemotherapy -cystic fibrosis -heart disease, such as angina, heart failure, or a history of a heart attack -hemoglobin level of 12 g/dL or greater -high blood pressure -low levels of folate, iron, or vitamin B12 -seizures -an unusual or allergic reaction to darbepoetin, erythropoietin, albumin, hamster proteins, latex, other medicines, foods, dyes, or preservatives -pregnant or trying to get pregnant -breast-feeding How should I use this medicine? This medicine is for injection into a vein or under the skin. It is usually given by a health care professional in a hospital or clinic setting. If you get this medicine at home, you will be taught how to prepare and give this medicine. Do not shake the solution before you withdraw a dose. Use exactly as directed. Take your medicine at regular intervals. Do not take your medicine more often than directed. It is important that you put your used needles and syringes in a special sharps container. Do not put them in a trash can. If you do not have a sharps container, call your pharmacist or healthcare provider to get one. Talk to your pediatrician regarding the use of this medicine in children. While this medicine may be used in children as young as 1 year for selected conditions, precautions do apply. Overdosage: If you think you have taken too much of this medicine contact a poison control center or emergency room at once. NOTE:  This medicine is only for you. Do not share this medicine with others. What if I miss a dose? If you miss a dose, take it as soon as you can. If it is almost time for your next dose, take only that dose. Do not take double or extra doses. What may interact with this medicine? Do not take this medicine with any of the following medications: -epoetin alfa This list may not describe all possible interactions. Give your health care provider a list of all the medicines, herbs, non-prescription drugs, or dietary supplements you use. Also tell them if you smoke, drink alcohol, or use illegal drugs. Some items may interact with your medicine. What should I watch for while using this medicine? Visit your prescriber or health care professional for regular checks on your progress and for the needed blood tests and blood pressure measurements. It is especially important for the doctor to make sure your hemoglobin level is in the desired range, to limit the risk of potential side effects and to give you the best benefit. Keep all appointments for any recommended tests. Check your blood pressure as directed. Ask your doctor what your blood pressure should be and when you should contact him or her. As your body makes more red blood cells, you may need to take iron, folic acid, or vitamin B supplements. Ask your doctor or health care provider which products are right for you. If you have kidney disease continue dietary restrictions, even though this medication can make you feel better. Talk with your doctor or health care professional about the   foods you eat and the vitamins that you take. What side effects may I notice from receiving this medicine? Side effects that you should report to your doctor or health care professional as soon as possible: -allergic reactions like skin rash, itching or hives, swelling of the face, lips, or tongue -breathing problems -changes in vision -chest pain -confusion, trouble speaking  or understanding -feeling faint or lightheaded, falls -high blood pressure -muscle aches or pains -pain, swelling, warmth in the leg -rapid weight gain -severe headaches -sudden numbness or weakness of the face, arm or leg -trouble walking, dizziness, loss of balance or coordination -seizures (convulsions) -swelling of the ankles, feet, hands -unusually weak or tired Side effects that usually do not require medical attention (report to your doctor or health care professional if they continue or are bothersome): -diarrhea -fever, chills (flu-like symptoms) -headaches -nausea, vomiting -redness, stinging, or swelling at site where injected This list may not describe all possible side effects. Call your doctor for medical advice about side effects. You may report side effects to FDA at 1-800-FDA-1088. Where should I keep my medicine? Keep out of the reach of children. Store in a refrigerator between 2 and 8 degrees C (36 and 46 degrees F). Do not freeze. Do not shake. Throw away any unused portion if using a single-dose vial. Throw away any unused medicine after the expiration date. NOTE: This sheet is a summary. It may not cover all possible information. If you have questions about this medicine, talk to your doctor, pharmacist, or health care provider.    2016, Elsevier/[employer]. (2008-05-22 10:23:57)

## 2017-12-29 NOTE — Progress Notes (Signed)
Remote pacemaker transmission.   

## 2018-01-10 ENCOUNTER — Other Ambulatory Visit: Payer: Self-pay | Admitting: *Deleted

## 2018-01-10 DIAGNOSIS — D631 Anemia in chronic kidney disease: Secondary | ICD-10-CM

## 2018-01-10 DIAGNOSIS — N189 Chronic kidney disease, unspecified: Principal | ICD-10-CM

## 2018-01-10 NOTE — Progress Notes (Signed)
Lab orders entered for 7/23

## 2018-01-11 ENCOUNTER — Inpatient Hospital Stay: Payer: Medicare Other

## 2018-01-11 VITALS — BP 187/52 | HR 59 | Temp 97.8°F | Resp 20

## 2018-01-11 DIAGNOSIS — D631 Anemia in chronic kidney disease: Secondary | ICD-10-CM

## 2018-01-11 DIAGNOSIS — N183 Chronic kidney disease, stage 3 unspecified: Secondary | ICD-10-CM

## 2018-01-11 DIAGNOSIS — N189 Chronic kidney disease, unspecified: Secondary | ICD-10-CM | POA: Diagnosis not present

## 2018-01-11 LAB — CBC WITH DIFFERENTIAL (CANCER CENTER ONLY)
Basophils Absolute: 0 10*3/uL (ref 0.0–0.1)
Basophils Relative: 0 %
Eosinophils Absolute: 0 10*3/uL (ref 0.0–0.5)
Eosinophils Relative: 0 %
HCT: 34.7 % — ABNORMAL LOW (ref 34.8–46.6)
Hemoglobin: 10.6 g/dL — ABNORMAL LOW (ref 11.6–15.9)
Lymphocytes Relative: 7 %
Lymphs Abs: 0.7 10*3/uL — ABNORMAL LOW (ref 0.9–3.3)
MCH: 29.6 pg (ref 25.1–34.0)
MCHC: 30.5 g/dL — ABNORMAL LOW (ref 31.5–36.0)
MCV: 96.9 fL (ref 79.5–101.0)
Monocytes Absolute: 0.6 10*3/uL (ref 0.1–0.9)
Monocytes Relative: 6 %
Neutro Abs: 8.7 10*3/uL — ABNORMAL HIGH (ref 1.5–6.5)
Neutrophils Relative %: 87 %
Platelet Count: 266 10*3/uL (ref 145–400)
RBC: 3.58 MIL/uL — ABNORMAL LOW (ref 3.70–5.45)
RDW: 16.6 % — ABNORMAL HIGH (ref 11.2–14.5)
WBC Count: 9.9 10*3/uL (ref 3.9–10.3)

## 2018-01-11 MED ORDER — DARBEPOETIN ALFA 200 MCG/0.4ML IJ SOSY
PREFILLED_SYRINGE | INTRAMUSCULAR | Status: AC
  Filled 2018-01-11: qty 0.4

## 2018-01-11 MED ORDER — DARBEPOETIN ALFA 200 MCG/0.4ML IJ SOSY: 200.0000 ug | PREFILLED_SYRINGE | Freq: Once | INTRAMUSCULAR | Status: DC

## 2018-01-11 NOTE — Patient Instructions (Signed)

## 2018-01-11 NOTE — Progress Notes (Signed)
Pt's HGB 10.6 no injection given today per plan parameters. Geannie Risen

## 2018-01-12 LAB — CUP PACEART REMOTE DEVICE CHECK
Battery Remaining Longevity: 89 mo
Battery Voltage: 3.01 V
Brady Statistic AP VP Percent: 73.61 %
Brady Statistic AP VS Percent: 3.05 %
Brady Statistic AS VP Percent: 9.99 %
Brady Statistic AS VS Percent: 13.36 %
Brady Statistic RA Percent Paced: 76.52 %
Brady Statistic RV Percent Paced: 83.54 %
Date Time Interrogation Session: 20190709174336
Implantable Lead Implant Date: 20161227
Implantable Lead Implant Date: 20161227
Implantable Lead Location: 753859
Implantable Lead Location: 753860
Implantable Lead Model: 5076
Implantable Lead Model: 5076
Implantable Pulse Generator Implant Date: 20161227
Lead Channel Impedance Value: 323 Ohm
Lead Channel Impedance Value: 342 Ohm
Lead Channel Impedance Value: 399 Ohm
Lead Channel Impedance Value: 437 Ohm
Lead Channel Pacing Threshold Amplitude: 0.75 V
Lead Channel Pacing Threshold Amplitude: 1 V
Lead Channel Pacing Threshold Pulse Width: 0.4 ms
Lead Channel Pacing Threshold Pulse Width: 0.4 ms
Lead Channel Sensing Intrinsic Amplitude: 1.625 mV
Lead Channel Sensing Intrinsic Amplitude: 1.625 mV
Lead Channel Sensing Intrinsic Amplitude: 13.125 mV
Lead Channel Sensing Intrinsic Amplitude: 13.125 mV
Lead Channel Setting Pacing Amplitude: 2 V
Lead Channel Setting Pacing Amplitude: 2.5 V
Lead Channel Setting Pacing Pulse Width: 0.4 ms
Lead Channel Setting Sensing Sensitivity: 2.8 mV

## 2018-01-24 ENCOUNTER — Encounter: Payer: Self-pay | Admitting: Nurse Practitioner

## 2018-01-24 ENCOUNTER — Other Ambulatory Visit: Payer: Self-pay

## 2018-01-24 ENCOUNTER — Inpatient Hospital Stay (HOSPITAL_BASED_OUTPATIENT_CLINIC_OR_DEPARTMENT_OTHER): Payer: Medicare Other | Admitting: Nurse Practitioner

## 2018-01-24 ENCOUNTER — Telehealth: Payer: Self-pay | Admitting: Nurse Practitioner

## 2018-01-24 ENCOUNTER — Inpatient Hospital Stay: Payer: Medicare Other

## 2018-01-24 ENCOUNTER — Inpatient Hospital Stay: Payer: Medicare Other | Attending: Oncology

## 2018-01-24 VITALS — BP 177/66 | HR 66 | Temp 98.3°F | Resp 17 | Ht 58.5 in | Wt 266.1 lb

## 2018-01-24 VITALS — BP 158/68

## 2018-01-24 DIAGNOSIS — N189 Chronic kidney disease, unspecified: Principal | ICD-10-CM

## 2018-01-24 DIAGNOSIS — D631 Anemia in chronic kidney disease: Secondary | ICD-10-CM

## 2018-01-24 DIAGNOSIS — N183 Chronic kidney disease, stage 3 unspecified: Secondary | ICD-10-CM

## 2018-01-24 DIAGNOSIS — E119 Type 2 diabetes mellitus without complications: Secondary | ICD-10-CM | POA: Insufficient documentation

## 2018-01-24 DIAGNOSIS — I4891 Unspecified atrial fibrillation: Secondary | ICD-10-CM

## 2018-01-24 LAB — CBC WITH DIFFERENTIAL (CANCER CENTER ONLY)
Basophils Absolute: 0 10*3/uL (ref 0.0–0.1)
Basophils Relative: 0 %
Eosinophils Absolute: 0 10*3/uL (ref 0.0–0.5)
Eosinophils Relative: 0 %
HCT: 26.6 % — ABNORMAL LOW (ref 34.8–46.6)
Hemoglobin: 8.2 g/dL — ABNORMAL LOW (ref 11.6–15.9)
Lymphocytes Relative: 8 %
Lymphs Abs: 0.6 10*3/uL — ABNORMAL LOW (ref 0.9–3.3)
MCH: 29.7 pg (ref 25.1–34.0)
MCHC: 30.8 g/dL — ABNORMAL LOW (ref 31.5–36.0)
MCV: 96.4 fL (ref 79.5–101.0)
Monocytes Absolute: 0.4 10*3/uL (ref 0.1–0.9)
Monocytes Relative: 5 %
Neutro Abs: 6.9 10*3/uL — ABNORMAL HIGH (ref 1.5–6.5)
Neutrophils Relative %: 87 %
Platelet Count: 246 10*3/uL (ref 145–400)
RBC: 2.76 MIL/uL — ABNORMAL LOW (ref 3.70–5.45)
RDW: 16.2 % — ABNORMAL HIGH (ref 11.2–14.5)
WBC Count: 7.9 10*3/uL (ref 3.9–10.3)

## 2018-01-24 MED ORDER — DARBEPOETIN ALFA 200 MCG/0.4ML IJ SOSY
200.0000 ug | PREFILLED_SYRINGE | Freq: Once | INTRAMUSCULAR | Status: AC
  Administered 2018-01-24: 200 ug via SUBCUTANEOUS

## 2018-01-24 MED ORDER — DARBEPOETIN ALFA 200 MCG/0.4ML IJ SOSY
PREFILLED_SYRINGE | INTRAMUSCULAR | Status: AC
  Filled 2018-01-24: qty 0.4

## 2018-01-24 NOTE — Telephone Encounter (Signed)
Scheduled appt per 8/5 los - gave patient aVS and calender per los.

## 2018-01-24 NOTE — Progress Notes (Signed)
  [facility] OFFICE PROGRESS NOTE   Diagnosis: Anemia, chronic renal failure  INTERVAL HISTORY:   Tricia Gonzales returns as scheduled.  She continues Aranesp every 2 weeks.  Aranesp was held 2 weeks ago due to hemoglobin 10.6.  She last received Feraheme 12/13/2017 and 12/20/2017.  She initially felt better following the iron infusions.  Over the past week she has noted a decrease in her energy level.  She denies any bleeding.  She has stable dyspnea on exertion.  She has intermittent loose stools which she attributes to oral iron.  Objective:  Vital signs in last 24 hours:  Blood pressure (!) 177/66, pulse 66, temperature 98.3 F (36.8 C), temperature source Oral, resp. rate 17, height 4' 10.5" (1.486 m), weight 266 lb 1.6 oz (120.7 kg), SpO2 97 %.    Resp: Lungs clear bilaterally. Cardio: Regular rate and rhythm. GI: Abdomen soft and nontender. Vascular: Trace edema at the lower legs bilaterally. Neuro: Alert and oriented.   Lab Results:  Lab Results  Component Value Date   WBC 7.9 01/24/2018   HGB 8.2 (L) 01/24/2018   HCT 26.6 (L) 01/24/2018   MCV 96.4 01/24/2018   PLT 246 01/24/2018   NEUTROABS 6.9 (H) 01/24/2018    Imaging:  No results found.  Medications: I have reviewed the patient's current medications.  Assessment/Plan: 1. 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; quantitative immunoglobulins show IgG mildly decreased 654 (range [phone]), IgA and IgM in normal range; polyclonal serum light chain  elevation  11/15/2015-hemoglobin 11.2  11/29/2015-hemoglobin 11.1  12/12/2005-hemoglobin 10.4; Aranesp 100 g every 2 weeks initiated  02/07/2016-hemoglobin 11.0, Aranesp held, changed to an every 3 week schedule  Progressive anemia and decreased iron stores December 2017, treated with IV iron January 2018 with improvement  She did not require Aranesp for 2 months and resumed Aranesp on a monthly schedule for 08/2016  Aranesp every 6 weeks beginning 05/04/2017  Progressive anemia and decreased iron stores 07/05/2017; IV irongiven2/11/2017 and 08/06/2017  Persistent severe anemia, Aranesp change to every 2 weeks beginning 10/19/2017  Persistent anemia, decreased iron stores 11/30/2017  IV iron 12/13/2017, 12/20/2017  2. Chronic renal insufficiency  3. Atrial fibrillation-maintained on apixaban  4. Osteoarthritis  5. Diabetes  6. Sjogren's disease  7. Pericarditis February 2017    Disposition: Ms. Dart appears unchanged.  The hemoglobin is lower as compared to 2 weeks ago.  This is likely due to Aranesp being held 2 weeks ago.  She will receive Aranesp today as scheduled.  We reviewed signs/symptoms suggestive of progressive anemia.  She understands to contact the office should she develop any of these.  When she returns in 2 weeks for the next Aranesp injection we will repeat iron studies.  We will see her in follow-up in 3 months.  She will contact the office in the interim as outlined above or with any other problems.  Plan reviewed with Dr. Benay Spice.    Ned Card ANP/GNP-BC   01/24/2018  1:09 PM

## 2018-02-01 ENCOUNTER — Other Ambulatory Visit: Payer: Self-pay | Admitting: Internal Medicine

## 2018-02-01 NOTE — Telephone Encounter (Signed)
Pt last saw [name] 07/02/17, last labs 12/20/17 Creat 1.43, age 77, weight 120.7kg, based on specified criteria pt is on appropriate dosage of Eliquis 93m BID.  Will refill rx.

## 2018-02-08 ENCOUNTER — Inpatient Hospital Stay: Payer: Medicare Other

## 2018-02-08 VITALS — BP 164/68 | HR 59 | Temp 97.9°F | Resp 18

## 2018-02-08 DIAGNOSIS — N189 Chronic kidney disease, unspecified: Secondary | ICD-10-CM | POA: Diagnosis not present

## 2018-02-08 DIAGNOSIS — N183 Chronic kidney disease, stage 3 unspecified: Secondary | ICD-10-CM

## 2018-02-08 DIAGNOSIS — D631 Anemia in chronic kidney disease: Secondary | ICD-10-CM

## 2018-02-08 LAB — IRON AND TIBC
Iron: 58 ug/dL (ref 41–142)
Saturation Ratios: 19 % — ABNORMAL LOW (ref 21–57)
TIBC: 299 ug/dL (ref 236–444)
UIBC: 241 ug/dL

## 2018-02-08 LAB — FERRITIN: Ferritin: 51 ng/mL (ref 11–307)

## 2018-02-08 LAB — CBC WITH DIFFERENTIAL (CANCER CENTER ONLY)
Basophils Absolute: 0 10*3/uL (ref 0.0–0.1)
Basophils Relative: 0 %
Eosinophils Absolute: 0 10*3/uL (ref 0.0–0.5)
Eosinophils Relative: 0 %
HCT: 26.9 % — ABNORMAL LOW (ref 34.8–46.6)
Hemoglobin: 8.8 g/dL — ABNORMAL LOW (ref 11.6–15.9)
Lymphocytes Relative: 6 %
Lymphs Abs: 0.4 10*3/uL — ABNORMAL LOW (ref 0.9–3.3)
MCH: 30.3 pg (ref 25.1–34.0)
MCHC: 32.7 g/dL (ref 31.5–36.0)
MCV: 92.7 fL (ref 79.5–101.0)
Monocytes Absolute: 0.5 10*3/uL (ref 0.1–0.9)
Monocytes Relative: 7 %
Neutro Abs: 6.3 10*3/uL (ref 1.5–6.5)
Neutrophils Relative %: 87 %
Platelet Count: 276 10*3/uL (ref 145–400)
RBC: 2.9 MIL/uL — ABNORMAL LOW (ref 3.70–5.45)
RDW: 17.2 % — ABNORMAL HIGH (ref 11.2–14.5)
WBC Count: 7.2 10*3/uL (ref 3.9–10.3)

## 2018-02-08 LAB — SAMPLE TO BLOOD BANK

## 2018-02-08 MED ORDER — DARBEPOETIN ALFA 200 MCG/0.4ML IJ SOSY
200.0000 ug | PREFILLED_SYRINGE | Freq: Once | INTRAMUSCULAR | Status: AC
  Administered 2018-02-08: 200 ug via SUBCUTANEOUS

## 2018-02-08 NOTE — Progress Notes (Signed)
Pt.'s BP 164/68 per Dr. Benay Spice ok to give injection

## 2018-02-22 ENCOUNTER — Inpatient Hospital Stay: Payer: Medicare Other | Attending: Oncology

## 2018-02-22 ENCOUNTER — Inpatient Hospital Stay: Payer: Medicare Other

## 2018-02-22 VITALS — BP 158/62

## 2018-02-22 DIAGNOSIS — N189 Chronic kidney disease, unspecified: Secondary | ICD-10-CM | POA: Diagnosis not present

## 2018-02-22 DIAGNOSIS — N183 Chronic kidney disease, stage 3 unspecified: Secondary | ICD-10-CM

## 2018-02-22 DIAGNOSIS — D631 Anemia in chronic kidney disease: Secondary | ICD-10-CM | POA: Diagnosis present

## 2018-02-22 LAB — CBC WITH DIFFERENTIAL (CANCER CENTER ONLY)
Basophils Absolute: 0 10*3/uL (ref 0.0–0.1)
Basophils Relative: 0 %
Eosinophils Absolute: 0 10*3/uL (ref 0.0–0.5)
Eosinophils Relative: 0 %
HCT: 30.2 % — ABNORMAL LOW (ref 34.8–46.6)
Hemoglobin: 9.6 g/dL — ABNORMAL LOW (ref 11.6–15.9)
Lymphocytes Relative: 8 %
Lymphs Abs: 0.5 10*3/uL — ABNORMAL LOW (ref 0.9–3.3)
MCH: 29.7 pg (ref 25.1–34.0)
MCHC: 31.6 g/dL (ref 31.5–36.0)
MCV: 94 fL (ref 79.5–101.0)
Monocytes Absolute: 0.5 10*3/uL (ref 0.1–0.9)
Monocytes Relative: 7 %
Neutro Abs: 5.5 10*3/uL (ref 1.5–6.5)
Neutrophils Relative %: 85 %
Platelet Count: 272 10*3/uL (ref 145–400)
RBC: 3.21 MIL/uL — ABNORMAL LOW (ref 3.70–5.45)
RDW: 17.1 % — ABNORMAL HIGH (ref 11.2–14.5)
WBC Count: 6.6 10*3/uL (ref 3.9–10.3)

## 2018-02-22 MED ORDER — DARBEPOETIN ALFA 200 MCG/0.4ML IJ SOSY
200.0000 ug | PREFILLED_SYRINGE | Freq: Once | INTRAMUSCULAR | Status: AC
  Administered 2018-02-22: 200 ug via SUBCUTANEOUS

## 2018-02-22 MED ORDER — DARBEPOETIN ALFA 200 MCG/0.4ML IJ SOSY
PREFILLED_SYRINGE | INTRAMUSCULAR | Status: AC
  Filled 2018-02-22: qty 0.4

## 2018-02-22 NOTE — Patient Instructions (Signed)
Darbepoetin Alfa injection What is this medicine? DARBEPOETIN ALFA (dar be POE e tin AL fa) helps your body make more red blood cells. It is used to treat anemia caused by chronic kidney failure and chemotherapy. This medicine may be used for other purposes; ask your health care provider or pharmacist if you have questions. What should I tell my health care provider before I take this medicine? They need to know if you have any of these conditions: -blood clotting disorders or history of blood clots -cancer patient not on chemotherapy -cystic fibrosis -heart disease, such as angina, heart failure, or a history of a heart attack -hemoglobin level of 12 g/dL or greater -high blood pressure -low levels of folate, iron, or vitamin B12 -seizures -an unusual or allergic reaction to darbepoetin, erythropoietin, albumin, hamster proteins, latex, other medicines, foods, dyes, or preservatives -pregnant or trying to get pregnant -breast-feeding How should I use this medicine? This medicine is for injection into a vein or under the skin. It is usually given by a health care professional in a hospital or clinic setting. If you get this medicine at home, you will be taught how to prepare and give this medicine. Do not shake the solution before you withdraw a dose. Use exactly as directed. Take your medicine at regular intervals. Do not take your medicine more often than directed. It is important that you put your used needles and syringes in a special sharps container. Do not put them in a trash can. If you do not have a sharps container, call your pharmacist or healthcare provider to get one. Talk to your pediatrician regarding the use of this medicine in children. While this medicine may be used in children as young as 1 year for selected conditions, precautions do apply. Overdosage: If you think you have taken too much of this medicine contact a poison control center or emergency room at once. NOTE:  This medicine is only for you. Do not share this medicine with others. What if I miss a dose? If you miss a dose, take it as soon as you can. If it is almost time for your next dose, take only that dose. Do not take double or extra doses. What may interact with this medicine? Do not take this medicine with any of the following medications: -epoetin alfa This list may not describe all possible interactions. Give your health care provider a list of all the medicines, herbs, non-prescription drugs, or dietary supplements you use. Also tell them if you smoke, drink alcohol, or use illegal drugs. Some items may interact with your medicine. What should I watch for while using this medicine? Visit your prescriber or health care professional for regular checks on your progress and for the needed blood tests and blood pressure measurements. It is especially important for the doctor to make sure your hemoglobin level is in the desired range, to limit the risk of potential side effects and to give you the best benefit. Keep all appointments for any recommended tests. Check your blood pressure as directed. Ask your doctor what your blood pressure should be and when you should contact him or her. As your body makes more red blood cells, you may need to take iron, folic acid, or vitamin B supplements. Ask your doctor or health care provider which products are right for you. If you have kidney disease continue dietary restrictions, even though this medication can make you feel better. Talk with your doctor or health care professional about the   foods you eat and the vitamins that you take. What side effects may I notice from receiving this medicine? Side effects that you should report to your doctor or health care professional as soon as possible: -allergic reactions like skin rash, itching or hives, swelling of the face, lips, or tongue -breathing problems -changes in vision -chest pain -confusion, trouble speaking  or understanding -feeling faint or lightheaded, falls -high blood pressure -muscle aches or pains -pain, swelling, warmth in the leg -rapid weight gain -severe headaches -sudden numbness or weakness of the face, arm or leg -trouble walking, dizziness, loss of balance or coordination -seizures (convulsions) -swelling of the ankles, feet, hands -unusually weak or tired Side effects that usually do not require medical attention (report to your doctor or health care professional if they continue or are bothersome): -diarrhea -fever, chills (flu-like symptoms) -headaches -nausea, vomiting -redness, stinging, or swelling at site where injected This list may not describe all possible side effects. Call your doctor for medical advice about side effects. You may report side effects to FDA at 1-800-FDA-1088. Where should I keep my medicine? Keep out of the reach of children. Store in a refrigerator between 2 and 8 degrees C (36 and 46 degrees F). Do not freeze. Do not shake. Throw away any unused portion if using a single-dose vial. Throw away any unused medicine after the expiration date. NOTE: This sheet is a summary. It may not cover all possible information. If you have questions about this medicine, talk to your doctor, pharmacist, or health care provider.    2016, Elsevier/[employer]. (2008-05-22 10:23:57)

## 2018-03-08 ENCOUNTER — Inpatient Hospital Stay: Payer: Medicare Other

## 2018-03-08 DIAGNOSIS — N189 Chronic kidney disease, unspecified: Principal | ICD-10-CM

## 2018-03-08 DIAGNOSIS — D631 Anemia in chronic kidney disease: Secondary | ICD-10-CM

## 2018-03-08 LAB — CBC WITH DIFFERENTIAL (CANCER CENTER ONLY)
Basophils Absolute: 0 10*3/uL (ref 0.0–0.1)
Basophils Relative: 0 %
Eosinophils Absolute: 0 10*3/uL (ref 0.0–0.5)
Eosinophils Relative: 0 %
HCT: 33.3 % — ABNORMAL LOW (ref 34.8–46.6)
Hemoglobin: 10.2 g/dL — ABNORMAL LOW (ref 11.6–15.9)
Lymphocytes Relative: 11 %
Lymphs Abs: 0.8 10*3/uL — ABNORMAL LOW (ref 0.9–3.3)
MCH: 29.6 pg (ref 25.1–34.0)
MCHC: 30.6 g/dL — ABNORMAL LOW (ref 31.5–36.0)
MCV: 96.5 fL (ref 79.5–101.0)
Monocytes Absolute: 0.5 10*3/uL (ref 0.1–0.9)
Monocytes Relative: 6 %
Neutro Abs: 6.4 10*3/uL (ref 1.5–6.5)
Neutrophils Relative %: 83 %
Platelet Count: 252 10*3/uL (ref 145–400)
RBC: 3.45 MIL/uL — ABNORMAL LOW (ref 3.70–5.45)
RDW: 15.1 % — ABNORMAL HIGH (ref 11.2–14.5)
WBC Count: 7.7 10*3/uL (ref 3.9–10.3)

## 2018-03-08 MED ORDER — DARBEPOETIN ALFA 200 MCG/0.4ML IJ SOSY
PREFILLED_SYRINGE | INTRAMUSCULAR | Status: AC
  Filled 2018-03-08: qty 0.4

## 2018-03-08 NOTE — Progress Notes (Signed)
Pt Hgb 10.2 Today no injection needed per doctor parameters gave copy of labs to pt

## 2018-03-22 ENCOUNTER — Inpatient Hospital Stay: Payer: Medicare Other

## 2018-03-22 ENCOUNTER — Inpatient Hospital Stay: Payer: Medicare Other | Attending: Oncology

## 2018-03-22 VITALS — BP 172/99 | Resp 18

## 2018-03-22 DIAGNOSIS — N189 Chronic kidney disease, unspecified: Principal | ICD-10-CM

## 2018-03-22 DIAGNOSIS — N183 Chronic kidney disease, stage 3 unspecified: Secondary | ICD-10-CM

## 2018-03-22 DIAGNOSIS — D631 Anemia in chronic kidney disease: Secondary | ICD-10-CM

## 2018-03-22 LAB — CBC WITH DIFFERENTIAL (CANCER CENTER ONLY)
Basophils Absolute: 0 10*3/uL (ref 0.0–0.1)
Basophils Relative: 0 %
Eosinophils Absolute: 0 10*3/uL (ref 0.0–0.5)
Eosinophils Relative: 0 %
HCT: 29.7 % — ABNORMAL LOW (ref 34.8–46.6)
Hemoglobin: 9.7 g/dL — ABNORMAL LOW (ref 11.6–15.9)
Lymphocytes Relative: 8 %
Lymphs Abs: 0.7 10*3/uL — ABNORMAL LOW (ref 0.9–3.3)
MCH: 29.7 pg (ref 25.1–34.0)
MCHC: 32.6 g/dL (ref 31.5–36.0)
MCV: 91.4 fL (ref 79.5–101.0)
Monocytes Absolute: 0.4 10*3/uL (ref 0.1–0.9)
Monocytes Relative: 5 %
Neutro Abs: 6.7 10*3/uL — ABNORMAL HIGH (ref 1.5–6.5)
Neutrophils Relative %: 87 %
Platelet Count: 284 10*3/uL (ref 145–400)
RBC: 3.26 MIL/uL — ABNORMAL LOW (ref 3.70–5.45)
RDW: 15.2 % — ABNORMAL HIGH (ref 11.2–14.5)
WBC Count: 7.8 10*3/uL (ref 3.9–10.3)

## 2018-03-22 MED ORDER — DARBEPOETIN ALFA 200 MCG/0.4ML IJ SOSY
PREFILLED_SYRINGE | INTRAMUSCULAR | Status: AC
  Filled 2018-03-22: qty 0.4

## 2018-03-22 MED ORDER — DARBEPOETIN ALFA 200 MCG/0.4ML IJ SOSY
200.0000 ug | PREFILLED_SYRINGE | Freq: Once | INTRAMUSCULAR | Status: AC
  Administered 2018-03-22: 200 ug via SUBCUTANEOUS

## 2018-03-22 NOTE — Progress Notes (Signed)
Pt. Came in today for aranesp injection HGB 9.7 Pt's blood pressure was 180/63 and retake was 172/99 per Dr. Benay Spice OK to give injection.

## 2018-03-23 ENCOUNTER — Ambulatory Visit: Payer: Medicare Other | Admitting: Internal Medicine

## 2018-03-29 ENCOUNTER — Ambulatory Visit (INDEPENDENT_AMBULATORY_CARE_PROVIDER_SITE_OTHER): Payer: Medicare Other | Admitting: *Deleted

## 2018-03-29 DIAGNOSIS — I442 Atrioventricular block, complete: Secondary | ICD-10-CM

## 2018-03-29 DIAGNOSIS — I10 Essential (primary) hypertension: Secondary | ICD-10-CM

## 2018-03-30 NOTE — Progress Notes (Signed)
Remote pacemaker transmission.

## 2018-04-05 ENCOUNTER — Inpatient Hospital Stay: Payer: Medicare Other

## 2018-04-05 VITALS — BP 152/45 | HR 60

## 2018-04-05 DIAGNOSIS — N183 Chronic kidney disease, stage 3 unspecified: Secondary | ICD-10-CM

## 2018-04-05 DIAGNOSIS — D631 Anemia in chronic kidney disease: Secondary | ICD-10-CM

## 2018-04-05 DIAGNOSIS — N189 Chronic kidney disease, unspecified: Principal | ICD-10-CM

## 2018-04-05 LAB — CBC WITH DIFFERENTIAL (CANCER CENTER ONLY)
Abs Immature Granulocytes: 0.03 10*3/uL (ref 0.00–0.07)
Basophils Absolute: 0 10*3/uL (ref 0.0–0.1)
Basophils Relative: 0 %
Eosinophils Absolute: 0 10*3/uL (ref 0.0–0.5)
Eosinophils Relative: 0 %
HCT: 29.7 % — ABNORMAL LOW (ref 36.0–46.0)
Hemoglobin: 9 g/dL — ABNORMAL LOW (ref 12.0–15.0)
Immature Granulocytes: 0 %
Lymphocytes Relative: 7 %
Lymphs Abs: 0.6 10*3/uL — ABNORMAL LOW (ref 0.7–4.0)
MCH: 29.4 pg (ref 26.0–34.0)
MCHC: 30.3 g/dL (ref 30.0–36.0)
MCV: 97.1 fL (ref 80.0–100.0)
Monocytes Absolute: 0.6 10*3/uL (ref 0.1–1.0)
Monocytes Relative: 6 %
Neutro Abs: 7.8 10*3/uL — ABNORMAL HIGH (ref 1.7–7.7)
Neutrophils Relative %: 87 %
Platelet Count: 265 10*3/uL (ref 150–400)
RBC: 3.06 MIL/uL — ABNORMAL LOW (ref 3.87–5.11)
RDW: 14.8 % (ref 11.5–15.5)
WBC Count: 9 10*3/uL (ref 4.0–10.5)
nRBC: 0 % (ref 0.0–0.2)

## 2018-04-05 MED ORDER — DARBEPOETIN ALFA 200 MCG/0.4ML IJ SOSY
PREFILLED_SYRINGE | INTRAMUSCULAR | Status: AC
  Filled 2018-04-05: qty 0.4

## 2018-04-05 MED ORDER — DARBEPOETIN ALFA 200 MCG/0.4ML IJ SOSY
200.0000 ug | PREFILLED_SYRINGE | Freq: Once | INTRAMUSCULAR | Status: AC
  Administered 2018-04-05: 200 ug via SUBCUTANEOUS

## 2018-04-06 ENCOUNTER — Encounter: Payer: Self-pay | Admitting: Cardiology

## 2018-04-19 ENCOUNTER — Inpatient Hospital Stay: Payer: Medicare Other

## 2018-04-19 VITALS — BP 158/68 | HR 60 | Temp 97.8°F

## 2018-04-19 DIAGNOSIS — N183 Chronic kidney disease, stage 3 unspecified: Secondary | ICD-10-CM

## 2018-04-19 DIAGNOSIS — D631 Anemia in chronic kidney disease: Secondary | ICD-10-CM

## 2018-04-19 DIAGNOSIS — N189 Chronic kidney disease, unspecified: Principal | ICD-10-CM

## 2018-04-19 LAB — CBC WITH DIFFERENTIAL (CANCER CENTER ONLY)
Abs Immature Granulocytes: 0.06 10*3/uL (ref 0.00–0.07)
Basophils Absolute: 0 10*3/uL (ref 0.0–0.1)
Basophils Relative: 0 %
Eosinophils Absolute: 0 10*3/uL (ref 0.0–0.5)
Eosinophils Relative: 0 %
HCT: 28.6 % — ABNORMAL LOW (ref 36.0–46.0)
Hemoglobin: 8.8 g/dL — ABNORMAL LOW (ref 12.0–15.0)
Immature Granulocytes: 1 %
Lymphocytes Relative: 8 %
Lymphs Abs: 0.7 10*3/uL (ref 0.7–4.0)
MCH: 29.6 pg (ref 26.0–34.0)
MCHC: 30.8 g/dL (ref 30.0–36.0)
MCV: 96.3 fL (ref 80.0–100.0)
Monocytes Absolute: 0.6 10*3/uL (ref 0.1–1.0)
Monocytes Relative: 6 %
Neutro Abs: 7.9 10*3/uL — ABNORMAL HIGH (ref 1.7–7.7)
Neutrophils Relative %: 85 %
Platelet Count: 299 10*3/uL (ref 150–400)
RBC: 2.97 MIL/uL — ABNORMAL LOW (ref 3.87–5.11)
RDW: 14.7 % (ref 11.5–15.5)
WBC Count: 9.2 10*3/uL (ref 4.0–10.5)
nRBC: 0 % (ref 0.0–0.2)

## 2018-04-19 MED ORDER — DARBEPOETIN ALFA 200 MCG/0.4ML IJ SOSY
PREFILLED_SYRINGE | INTRAMUSCULAR | Status: AC
  Filled 2018-04-19: qty 0.4

## 2018-04-19 MED ORDER — DARBEPOETIN ALFA 200 MCG/0.4ML IJ SOSY
200.0000 ug | PREFILLED_SYRINGE | Freq: Once | INTRAMUSCULAR | Status: AC
  Administered 2018-04-19: 200 ug via SUBCUTANEOUS

## 2018-04-19 NOTE — Patient Instructions (Signed)
Darbepoetin Alfa injection What is this medicine? DARBEPOETIN ALFA (dar be POE e tin AL fa) helps your body make more red blood cells. It is used to treat anemia caused by chronic kidney failure and chemotherapy. This medicine may be used for other purposes; ask your health care provider or pharmacist if you have questions. What should I tell my health care provider before I take this medicine? They need to know if you have any of these conditions: -blood clotting disorders or history of blood clots -cancer patient not on chemotherapy -cystic fibrosis -heart disease, such as angina, heart failure, or a history of a heart attack -hemoglobin level of 12 g/dL or greater -high blood pressure -low levels of folate, iron, or vitamin B12 -seizures -an unusual or allergic reaction to darbepoetin, erythropoietin, albumin, hamster proteins, latex, other medicines, foods, dyes, or preservatives -pregnant or trying to get pregnant -breast-feeding How should I use this medicine? This medicine is for injection into a vein or under the skin. It is usually given by a health care professional in a hospital or clinic setting. If you get this medicine at home, you will be taught how to prepare and give this medicine. Do not shake the solution before you withdraw a dose. Use exactly as directed. Take your medicine at regular intervals. Do not take your medicine more often than directed. It is important that you put your used needles and syringes in a special sharps container. Do not put them in a trash can. If you do not have a sharps container, call your pharmacist or healthcare provider to get one. Talk to your pediatrician regarding the use of this medicine in children. While this medicine may be used in children as young as 1 year for selected conditions, precautions do apply. Overdosage: If you think you have taken too much of this medicine contact a poison control center or emergency room at once. NOTE:  This medicine is only for you. Do not share this medicine with others. What if I miss a dose? If you miss a dose, take it as soon as you can. If it is almost time for your next dose, take only that dose. Do not take double or extra doses. What may interact with this medicine? Do not take this medicine with any of the following medications: -epoetin alfa This list may not describe all possible interactions. Give your health care provider a list of all the medicines, herbs, non-prescription drugs, or dietary supplements you use. Also tell them if you smoke, drink alcohol, or use illegal drugs. Some items may interact with your medicine. What should I watch for while using this medicine? Visit your prescriber or health care professional for regular checks on your progress and for the needed blood tests and blood pressure measurements. It is especially important for the doctor to make sure your hemoglobin level is in the desired range, to limit the risk of potential side effects and to give you the best benefit. Keep all appointments for any recommended tests. Check your blood pressure as directed. Ask your doctor what your blood pressure should be and when you should contact him or her. As your body makes more red blood cells, you may need to take iron, folic acid, or vitamin B supplements. Ask your doctor or health care provider which products are right for you. If you have kidney disease continue dietary restrictions, even though this medication can make you feel better. Talk with your doctor or health care professional about the   foods you eat and the vitamins that you take. What side effects may I notice from receiving this medicine? Side effects that you should report to your doctor or health care professional as soon as possible: -allergic reactions like skin rash, itching or hives, swelling of the face, lips, or tongue -breathing problems -changes in vision -chest pain -confusion, trouble speaking  or understanding -feeling faint or lightheaded, falls -high blood pressure -muscle aches or pains -pain, swelling, warmth in the leg -rapid weight gain -severe headaches -sudden numbness or weakness of the face, arm or leg -trouble walking, dizziness, loss of balance or coordination -seizures (convulsions) -swelling of the ankles, feet, hands -unusually weak or tired Side effects that usually do not require medical attention (report to your doctor or health care professional if they continue or are bothersome): -diarrhea -fever, chills (flu-like symptoms) -headaches -nausea, vomiting -redness, stinging, or swelling at site where injected This list may not describe all possible side effects. Call your doctor for medical advice about side effects. You may report side effects to FDA at 1-800-FDA-1088. Where should I keep my medicine? Keep out of the reach of children. Store in a refrigerator between 2 and 8 degrees C (36 and 46 degrees F). Do not freeze. Do not shake. Throw away any unused portion if using a single-dose vial. Throw away any unused medicine after the expiration date. NOTE: This sheet is a summary. It may not cover all possible information. If you have questions about this medicine, talk to your doctor, pharmacist, or health care provider.    2016, Elsevier/[employer]. (2008-05-22 10:23:57)

## 2018-04-21 LAB — CUP PACEART REMOTE DEVICE CHECK
Battery Remaining Longevity: 87 mo
Battery Voltage: 3.01 V
Brady Statistic AP VP Percent: 76.56 %
Brady Statistic AP VS Percent: 2.15 %
Brady Statistic AS VP Percent: 12.83 %
Brady Statistic AS VS Percent: 8.46 %
Brady Statistic RA Percent Paced: 78.64 %
Brady Statistic RV Percent Paced: 89.27 %
Date Time Interrogation Session: 20191008171643
Implantable Lead Implant Date: 20161227
Implantable Lead Implant Date: 20161227
Implantable Lead Location: 753859
Implantable Lead Location: 753860
Implantable Lead Model: 5076
Implantable Lead Model: 5076
Implantable Pulse Generator Implant Date: 20161227
Lead Channel Impedance Value: 342 Ohm
Lead Channel Impedance Value: 361 Ohm
Lead Channel Impedance Value: 380 Ohm
Lead Channel Impedance Value: 437 Ohm
Lead Channel Pacing Threshold Amplitude: 0.625 V
Lead Channel Pacing Threshold Amplitude: 1 V
Lead Channel Pacing Threshold Pulse Width: 0.4 ms
Lead Channel Pacing Threshold Pulse Width: 0.4 ms
Lead Channel Sensing Intrinsic Amplitude: 1.75 mV
Lead Channel Sensing Intrinsic Amplitude: 1.75 mV
Lead Channel Sensing Intrinsic Amplitude: 14.625 mV
Lead Channel Sensing Intrinsic Amplitude: 14.625 mV
Lead Channel Setting Pacing Amplitude: 2 V
Lead Channel Setting Pacing Amplitude: 2.5 V
Lead Channel Setting Pacing Pulse Width: 0.4 ms
Lead Channel Setting Sensing Sensitivity: 2.8 mV

## 2018-05-03 ENCOUNTER — Inpatient Hospital Stay (HOSPITAL_BASED_OUTPATIENT_CLINIC_OR_DEPARTMENT_OTHER): Payer: Medicare Other | Admitting: Oncology

## 2018-05-03 ENCOUNTER — Inpatient Hospital Stay: Payer: Medicare Other

## 2018-05-03 ENCOUNTER — Telehealth: Payer: Self-pay | Admitting: Oncology

## 2018-05-03 ENCOUNTER — Inpatient Hospital Stay: Payer: Medicare Other | Attending: Oncology

## 2018-05-03 VITALS — BP 162/84 | HR 71 | Temp 97.5°F | Resp 18 | Ht 58.5 in | Wt 264.2 lb

## 2018-05-03 DIAGNOSIS — R5381 Other malaise: Secondary | ICD-10-CM | POA: Diagnosis not present

## 2018-05-03 DIAGNOSIS — N183 Chronic kidney disease, stage 3 unspecified: Secondary | ICD-10-CM

## 2018-05-03 DIAGNOSIS — N189 Chronic kidney disease, unspecified: Secondary | ICD-10-CM | POA: Diagnosis not present

## 2018-05-03 DIAGNOSIS — E1122 Type 2 diabetes mellitus with diabetic chronic kidney disease: Secondary | ICD-10-CM | POA: Insufficient documentation

## 2018-05-03 DIAGNOSIS — D631 Anemia in chronic kidney disease: Secondary | ICD-10-CM | POA: Diagnosis present

## 2018-05-03 DIAGNOSIS — I4891 Unspecified atrial fibrillation: Secondary | ICD-10-CM | POA: Insufficient documentation

## 2018-05-03 DIAGNOSIS — R0609 Other forms of dyspnea: Secondary | ICD-10-CM | POA: Insufficient documentation

## 2018-05-03 LAB — CBC WITH DIFFERENTIAL (CANCER CENTER ONLY)
Abs Immature Granulocytes: 0.04 10*3/uL (ref 0.00–0.07)
Basophils Absolute: 0 10*3/uL (ref 0.0–0.1)
Basophils Relative: 0 %
Eosinophils Absolute: 0 10*3/uL (ref 0.0–0.5)
Eosinophils Relative: 0 %
HCT: 28.7 % — ABNORMAL LOW (ref 36.0–46.0)
Hemoglobin: 8.5 g/dL — ABNORMAL LOW (ref 12.0–15.0)
Immature Granulocytes: 1 %
Lymphocytes Relative: 7 %
Lymphs Abs: 0.5 10*3/uL — ABNORMAL LOW (ref 0.7–4.0)
MCH: 29.3 pg (ref 26.0–34.0)
MCHC: 29.6 g/dL — ABNORMAL LOW (ref 30.0–36.0)
MCV: 99 fL (ref 80.0–100.0)
Monocytes Absolute: 0.5 10*3/uL (ref 0.1–1.0)
Monocytes Relative: 6 %
Neutro Abs: 6.5 10*3/uL (ref 1.7–7.7)
Neutrophils Relative %: 86 %
Platelet Count: 265 10*3/uL (ref 150–400)
RBC: 2.9 MIL/uL — ABNORMAL LOW (ref 3.87–5.11)
RDW: 15.4 % (ref 11.5–15.5)
WBC Count: 7.6 10*3/uL (ref 4.0–10.5)
nRBC: 0 % (ref 0.0–0.2)

## 2018-05-03 MED ORDER — DARBEPOETIN ALFA 200 MCG/0.4ML IJ SOSY
200.0000 ug | PREFILLED_SYRINGE | Freq: Once | INTRAMUSCULAR | Status: AC
  Administered 2018-05-03: 200 ug via SUBCUTANEOUS

## 2018-05-03 MED ORDER — DARBEPOETIN ALFA 200 MCG/0.4ML IJ SOSY
PREFILLED_SYRINGE | INTRAMUSCULAR | Status: AC
  Filled 2018-05-03: qty 0.4

## 2018-05-03 NOTE — Telephone Encounter (Signed)
Scheduled appt per 11/12 los- gave patient AVS and calender per los.

## 2018-05-03 NOTE — Progress Notes (Signed)
[facility] OFFICE PROGRESS NOTE   Diagnosis: Anemia, renal insufficiency  INTERVAL HISTORY:   Tricia Gonzales returns as scheduled.  She last received Aranesp on 04/19/2018.  She is taking iron.  She reports iron causes diarrhea.  Her stools are dark.  She has not noted bleeding.  She has chronic malaise.  She has chronic exertional dyspnea.  No recent change.  Objective:  Vital signs in last 24 hours:  Blood pressure (!) 162/84, pulse 71, temperature (!) 97.5 F (36.4 C), temperature source Oral, resp. rate 18, height 4' 10.5" (1.486 m), weight 264 lb 3.2 oz (119.8 kg), SpO2 97 %.   Resp: Inspiratory rhonchi at the posterior chest bilaterally, no respiratory distress Cardio: Irregular GI: No hepatosplenomegaly, nontender Vascular: Trace edema with chronic stasis change at the low leg bilaterally  Lab Results:  Lab Results  Component Value Date   WBC 7.6 05/03/2018   HGB 8.5 (L) 05/03/2018   HCT 28.7 (L) 05/03/2018   MCV 99.0 05/03/2018   PLT 265 05/03/2018   NEUTROABS 6.5 05/03/2018    CMP  Lab Results  Component Value Date   NA 142 12/20/2017   K 4.5 12/20/2017   CL 107 12/20/2017   CO2 31 12/20/2017   GLUCOSE 142 (H) 12/20/2017   BUN 43 (H) 12/20/2017   CREATININE 1.43 (H) 12/20/2017   CALCIUM 8.7 (L) 12/20/2017   PROT 6.0 (L) 12/20/2017   ALBUMIN 3.3 (L) 12/20/2017   AST 13 (L) 12/20/2017   ALT 7 12/20/2017   ALKPHOS 61 12/20/2017   BILITOT 0.3 12/20/2017   GFRNONAA 35 (L) 12/20/2017   GFRAA 40 (L) 12/20/2017     Medications: I have reviewed the patient's current medications.   Assessment/Plan: 1. Normocytic anemia-chronic  normal ferritin, serum iron studies-low percent transferrin saturation, normal transferrin  Hemoccult positive stool February 2017, upper endoscopy 08/12/2015 with no source for bleeding  09/27/2015 erythropoietin 40,000 units weekly initiated  09/27/2015 erythropoietin level 48.3 (range 2.6-18.5)  10/11/2015  serum protein electrophoresis with no M spike observed; serum IFE shows IgA monoclonal protein with lambda light chain specificity; quantitative immunoglobulins show IgG mildly decreased 654 (range [phone]), IgA and IgM in normal range; polyclonal serum light chain elevation  11/15/2015-hemoglobin 11.2  11/29/2015-hemoglobin 11.1  12/12/2005-hemoglobin 10.4; Aranesp 100 g every 2 weeks initiated  02/07/2016-hemoglobin 11.0, Aranesp held, changed to an every 3 week schedule  Progressive anemia and decreased iron stores December 2017, treated with IV iron January 2018 with improvement  She did not require Aranesp for 2 months and resumed Aranesp on a monthly schedule for 08/2016  Aranesp every 6 weeks beginning 05/04/2017  Progressive anemia and decreased iron stores 07/05/2017; IV irongiven2/11/2017 and 08/06/2017  Persistent severe anemia, Aranesp change to every 2 weeks beginning 10/19/2017  Persistent anemia, decreased iron stores 11/30/2017  IV iron 12/13/2017, 12/20/2017  2. Chronic renal insufficiency  3. Atrial fibrillation-maintained on apixaban  4. Osteoarthritis  5. Diabetes  6. Sjogren's disease  7. Pericarditis February 2017     Disposition: Tricia Gonzales appears unchanged.  The hemoglobin has been lower over the past few months.  We will check iron studies when she returns in 2 weeks.  She will receive IV iron as indicated.  She would like to increase the Aranesp interval to 3 or 4 weeks.  She will remain on the 2-week Aranesp scheduled for now.  We will consider changing to a 3-week schedule if the hemoglobin improves.  She will return in 2 weeks for  Aranesp and IV iron as needed.  Tricia Gonzales will be scheduled for an office visit in January.  She will contact us for symptoms of anemia.  [name], MD  05/03/2018  12:16 PM

## 2018-05-17 ENCOUNTER — Inpatient Hospital Stay: Payer: Medicare Other

## 2018-05-17 VITALS — BP 175/74 | HR 66 | Resp 20

## 2018-05-17 DIAGNOSIS — N189 Chronic kidney disease, unspecified: Principal | ICD-10-CM

## 2018-05-17 DIAGNOSIS — N183 Chronic kidney disease, stage 3 unspecified: Secondary | ICD-10-CM

## 2018-05-17 DIAGNOSIS — D631 Anemia in chronic kidney disease: Secondary | ICD-10-CM

## 2018-05-17 LAB — CBC WITH DIFFERENTIAL (CANCER CENTER ONLY)
Abs Immature Granulocytes: 0.03 10*3/uL (ref 0.00–0.07)
Basophils Absolute: 0 10*3/uL (ref 0.0–0.1)
Basophils Relative: 0 %
Eosinophils Absolute: 0 10*3/uL (ref 0.0–0.5)
Eosinophils Relative: 0 %
HCT: 31 % — ABNORMAL LOW (ref 36.0–46.0)
Hemoglobin: 9.2 g/dL — ABNORMAL LOW (ref 12.0–15.0)
Immature Granulocytes: 0 %
Lymphocytes Relative: 7 %
Lymphs Abs: 0.6 10*3/uL — ABNORMAL LOW (ref 0.7–4.0)
MCH: 28.7 pg (ref 26.0–34.0)
MCHC: 29.7 g/dL — ABNORMAL LOW (ref 30.0–36.0)
MCV: 96.6 fL (ref 80.0–100.0)
Monocytes Absolute: 0.5 10*3/uL (ref 0.1–1.0)
Monocytes Relative: 7 %
Neutro Abs: 6.7 10*3/uL (ref 1.7–7.7)
Neutrophils Relative %: 86 %
Platelet Count: 299 10*3/uL (ref 150–400)
RBC: 3.21 MIL/uL — ABNORMAL LOW (ref 3.87–5.11)
RDW: 15 % (ref 11.5–15.5)
WBC Count: 7.9 10*3/uL (ref 4.0–10.5)
nRBC: 0 % (ref 0.0–0.2)

## 2018-05-17 LAB — FERRITIN: Ferritin: 46 ng/mL (ref 11–307)

## 2018-05-17 LAB — IRON AND TIBC
Iron: 54 ug/dL (ref 41–142)
Saturation Ratios: 16 % — ABNORMAL LOW (ref 21–57)
TIBC: 346 ug/dL (ref 236–444)
UIBC: 292 ug/dL (ref 120–384)

## 2018-05-17 LAB — SAMPLE TO BLOOD BANK

## 2018-05-17 MED ORDER — DARBEPOETIN ALFA 200 MCG/0.4ML IJ SOSY
200.0000 ug | PREFILLED_SYRINGE | Freq: Once | INTRAMUSCULAR | Status: AC
  Administered 2018-05-17: 200 ug via SUBCUTANEOUS

## 2018-05-17 MED ORDER — DARBEPOETIN ALFA 200 MCG/0.4ML IJ SOSY
PREFILLED_SYRINGE | INTRAMUSCULAR | Status: AC
  Filled 2018-05-17: qty 0.4

## 2018-05-17 NOTE — Progress Notes (Signed)
Per Dr. Benay Spice, it is ok for patient to receive Aranesp today with BP of 175/74.

## 2018-05-17 NOTE — Patient Instructions (Signed)

## 2018-05-20 ENCOUNTER — Telehealth: Payer: Self-pay | Admitting: *Deleted

## 2018-05-20 ENCOUNTER — Telehealth: Payer: Self-pay | Admitting: Nurse Practitioner

## 2018-05-20 NOTE — Telephone Encounter (Signed)
Called results of ferritin/iron/ibc and informed patient she needs iv iron treatments again weekly x 2. She understands and agrees. Will begin week of 12/9. She understands it can't occur on same day as her injection. Scheduling message sent.

## 2018-05-20 NOTE — Telephone Encounter (Signed)
Called regarding 12/9

## 2018-05-20 NOTE — Telephone Encounter (Signed)
-----  Message from Ladell Pier, MD sent at 05/18/2018  7:08 AM EST ----- Please call patient, needs IV iron, weekly for 2 doses, let me know dates and I will order

## 2018-05-25 ENCOUNTER — Telehealth: Payer: Self-pay | Admitting: *Deleted

## 2018-05-25 NOTE — Telephone Encounter (Signed)
Patient called back to report the iron infusion delay till January was her decision. She is too busy to do them in December.

## 2018-05-25 NOTE — Telephone Encounter (Signed)
Left VM for patient to call to discuss the IV iron infusions. Noted they were scheduled for January and she had agreed to start week of 05/30/18. Requested return call to discuss.

## 2018-05-30 ENCOUNTER — Ambulatory Visit: Payer: Medicare Other

## 2018-05-31 ENCOUNTER — Inpatient Hospital Stay: Payer: Medicare Other

## 2018-05-31 ENCOUNTER — Inpatient Hospital Stay: Payer: Medicare Other | Attending: Oncology

## 2018-05-31 VITALS — BP 163/65 | HR 69 | Temp 97.9°F | Resp 20

## 2018-05-31 DIAGNOSIS — D631 Anemia in chronic kidney disease: Secondary | ICD-10-CM

## 2018-05-31 DIAGNOSIS — N183 Chronic kidney disease, stage 3 unspecified: Secondary | ICD-10-CM

## 2018-05-31 DIAGNOSIS — N189 Chronic kidney disease, unspecified: Secondary | ICD-10-CM | POA: Diagnosis not present

## 2018-05-31 LAB — CBC WITH DIFFERENTIAL (CANCER CENTER ONLY)
Abs Immature Granulocytes: 0.05 10*3/uL (ref 0.00–0.07)
Basophils Absolute: 0 10*3/uL (ref 0.0–0.1)
Basophils Relative: 0 %
Eosinophils Absolute: 0 10*3/uL (ref 0.0–0.5)
Eosinophils Relative: 0 %
HCT: 29.6 % — ABNORMAL LOW (ref 36.0–46.0)
Hemoglobin: 8.9 g/dL — ABNORMAL LOW (ref 12.0–15.0)
Immature Granulocytes: 1 %
Lymphocytes Relative: 6 %
Lymphs Abs: 0.6 10*3/uL — ABNORMAL LOW (ref 0.7–4.0)
MCH: 29.3 pg (ref 26.0–34.0)
MCHC: 30.1 g/dL (ref 30.0–36.0)
MCV: 97.4 fL (ref 80.0–100.0)
Monocytes Absolute: 0.5 10*3/uL (ref 0.1–1.0)
Monocytes Relative: 6 %
Neutro Abs: 7.9 10*3/uL — ABNORMAL HIGH (ref 1.7–7.7)
Neutrophils Relative %: 87 %
Platelet Count: 277 10*3/uL (ref 150–400)
RBC: 3.04 MIL/uL — ABNORMAL LOW (ref 3.87–5.11)
RDW: 15.3 % (ref 11.5–15.5)
WBC Count: 9.1 10*3/uL (ref 4.0–10.5)
nRBC: 0 % (ref 0.0–0.2)

## 2018-05-31 MED ORDER — DARBEPOETIN ALFA 200 MCG/0.4ML IJ SOSY
200.0000 ug | PREFILLED_SYRINGE | Freq: Once | INTRAMUSCULAR | Status: AC
  Administered 2018-05-31: 200 ug via SUBCUTANEOUS

## 2018-05-31 MED ORDER — DARBEPOETIN ALFA 200 MCG/0.4ML IJ SOSY
PREFILLED_SYRINGE | INTRAMUSCULAR | Status: AC
  Filled 2018-05-31: qty 0.4

## 2018-05-31 NOTE — Patient Instructions (Signed)

## 2018-05-31 NOTE — Progress Notes (Signed)
Per Dr. Benay Spice okay to administer 200 mg aranesp today with pt BP 163/65

## 2018-06-06 ENCOUNTER — Ambulatory Visit: Payer: Medicare Other

## 2018-06-21 ENCOUNTER — Inpatient Hospital Stay: Payer: Medicare Other

## 2018-06-21 VITALS — BP 154/72 | HR 69 | Temp 98.6°F | Resp 19

## 2018-06-21 DIAGNOSIS — N183 Chronic kidney disease, stage 3 unspecified: Secondary | ICD-10-CM

## 2018-06-21 DIAGNOSIS — D631 Anemia in chronic kidney disease: Secondary | ICD-10-CM

## 2018-06-21 DIAGNOSIS — N189 Chronic kidney disease, unspecified: Principal | ICD-10-CM

## 2018-06-21 LAB — CBC WITH DIFFERENTIAL (CANCER CENTER ONLY)
Abs Immature Granulocytes: 0.05 10*3/uL (ref 0.00–0.07)
Basophils Absolute: 0 10*3/uL (ref 0.0–0.1)
Basophils Relative: 0 %
Eosinophils Absolute: 0.1 10*3/uL (ref 0.0–0.5)
Eosinophils Relative: 1 %
HCT: 28.1 % — ABNORMAL LOW (ref 36.0–46.0)
Hemoglobin: 8.4 g/dL — ABNORMAL LOW (ref 12.0–15.0)
Immature Granulocytes: 1 %
Lymphocytes Relative: 7 %
Lymphs Abs: 0.6 10*3/uL — ABNORMAL LOW (ref 0.7–4.0)
MCH: 29.3 pg (ref 26.0–34.0)
MCHC: 29.9 g/dL — ABNORMAL LOW (ref 30.0–36.0)
MCV: 97.9 fL (ref 80.0–100.0)
Monocytes Absolute: 0.5 10*3/uL (ref 0.1–1.0)
Monocytes Relative: 5 %
Neutro Abs: 7.9 10*3/uL — ABNORMAL HIGH (ref 1.7–7.7)
Neutrophils Relative %: 86 %
Platelet Count: 260 10*3/uL (ref 150–400)
RBC: 2.87 MIL/uL — ABNORMAL LOW (ref 3.87–5.11)
RDW: 14.8 % (ref 11.5–15.5)
WBC Count: 9.1 10*3/uL (ref 4.0–10.5)
nRBC: 0 % (ref 0.0–0.2)

## 2018-06-21 MED ORDER — DARBEPOETIN ALFA 200 MCG/0.4ML IJ SOSY
200.0000 ug | PREFILLED_SYRINGE | Freq: Once | INTRAMUSCULAR | Status: AC
  Administered 2018-06-21: 200 ug via SUBCUTANEOUS

## 2018-06-21 MED ORDER — DARBEPOETIN ALFA 200 MCG/0.4ML IJ SOSY
PREFILLED_SYRINGE | INTRAMUSCULAR | Status: AC
  Filled 2018-06-21: qty 0.4

## 2018-06-21 NOTE — Patient Instructions (Signed)

## 2018-06-24 ENCOUNTER — Inpatient Hospital Stay: Payer: Medicare Other | Attending: Oncology

## 2018-06-24 ENCOUNTER — Other Ambulatory Visit: Payer: Self-pay | Admitting: Oncology

## 2018-06-24 VITALS — BP 152/60 | HR 66 | Temp 97.8°F | Resp 18

## 2018-06-24 DIAGNOSIS — D631 Anemia in chronic kidney disease: Secondary | ICD-10-CM | POA: Insufficient documentation

## 2018-06-24 DIAGNOSIS — E1122 Type 2 diabetes mellitus with diabetic chronic kidney disease: Secondary | ICD-10-CM | POA: Diagnosis not present

## 2018-06-24 DIAGNOSIS — D509 Iron deficiency anemia, unspecified: Secondary | ICD-10-CM | POA: Diagnosis not present

## 2018-06-24 DIAGNOSIS — N189 Chronic kidney disease, unspecified: Secondary | ICD-10-CM | POA: Diagnosis not present

## 2018-06-24 DIAGNOSIS — I4891 Unspecified atrial fibrillation: Secondary | ICD-10-CM | POA: Insufficient documentation

## 2018-06-24 DIAGNOSIS — N183 Chronic kidney disease, stage 3 unspecified: Secondary | ICD-10-CM

## 2018-06-24 MED ORDER — SODIUM CHLORIDE 0.9 % IV SOLN
Freq: Once | INTRAVENOUS | Status: AC
  Administered 2018-06-24: 15:00:00 via INTRAVENOUS
  Filled 2018-06-24: qty 250

## 2018-06-24 MED ORDER — SODIUM CHLORIDE 0.9 % IV SOLN
510.0000 mg | Freq: Once | INTRAVENOUS | Status: AC
  Administered 2018-06-24: 510 mg via INTRAVENOUS
  Filled 2018-06-24: qty 17

## 2018-06-24 NOTE — Patient Instructions (Signed)
Ferumoxytol injection What is this medicine? FERUMOXYTOL is an iron complex. Iron is used to make healthy red blood cells, which carry oxygen and nutrients throughout the body. This medicine is used to treat iron deficiency anemia. This medicine may be used for other purposes; ask your health care provider or pharmacist if you have questions. COMMON BRAND NAME(S): Feraheme What should I tell my health care provider before I take this medicine? They need to know if you have any of these conditions: -anemia not caused by low iron levels -high levels of iron in the blood -magnetic resonance imaging (MRI) test scheduled -an unusual or allergic reaction to iron, other medicines, foods, dyes, or preservatives -pregnant or trying to get pregnant -breast-feeding How should I use this medicine? This medicine is for injection into a vein. It is given by a health care professional in a hospital or clinic setting. Talk to your pediatrician regarding the use of this medicine in children. Special care may be needed. Overdosage: If you think you have taken too much of this medicine contact a poison control center or emergency room at once. NOTE: This medicine is only for you. Do not share this medicine with others. What if I miss a dose? It is important not to miss your dose. Call your doctor or health care professional if you are unable to keep an appointment. What may interact with this medicine? This medicine may interact with the following medications: -other iron products This list may not describe all possible interactions. Give your health care provider a list of all the medicines, herbs, non-prescription drugs, or dietary supplements you use. Also tell them if you smoke, drink alcohol, or use illegal drugs. Some items may interact with your medicine. What should I watch for while using this medicine? Visit your doctor or healthcare professional regularly. Tell your doctor or healthcare professional  if your symptoms do not start to get better or if they get worse. You may need blood work done while you are taking this medicine. You may need to follow a special diet. Talk to your doctor. Foods that contain iron include: whole grains/cereals, dried fruits, beans, or peas, leafy green vegetables, and organ meats (liver, kidney). What side effects may I notice from receiving this medicine? Side effects that you should report to your doctor or health care professional as soon as possible: -allergic reactions like skin rash, itching or hives, swelling of the face, lips, or tongue -breathing problems -changes in blood pressure -feeling faint or lightheaded, falls -fever or chills -flushing, sweating, or hot feelings -swelling of the ankles or feet Side effects that usually do not require medical attention (report to your doctor or health care professional if they continue or are bothersome): -diarrhea -headache -nausea, vomiting -stomach pain This list may not describe all possible side effects. Call your doctor for medical advice about side effects. You may report side effects to FDA at 1-800-FDA-1088. Where should I keep my medicine? This drug is given in a hospital or clinic and will not be stored at home. NOTE: This sheet is a summary. It may not cover all possible information. If you have questions about this medicine, talk to your doctor, pharmacist, or health care provider.  2019 Elsevier/Gold Standard (2016-07-27 20:21:10)

## 2018-06-28 ENCOUNTER — Ambulatory Visit (INDEPENDENT_AMBULATORY_CARE_PROVIDER_SITE_OTHER): Payer: Medicare Other

## 2018-06-28 DIAGNOSIS — I442 Atrioventricular block, complete: Secondary | ICD-10-CM

## 2018-06-29 LAB — CUP PACEART REMOTE DEVICE CHECK
Battery Remaining Longevity: 86 mo
Battery Voltage: 3.01 V
Brady Statistic AP VP Percent: 46.47 %
Brady Statistic AP VS Percent: 2.38 %
Brady Statistic AS VP Percent: 33.97 %
Brady Statistic AS VS Percent: 17.18 %
Brady Statistic RA Percent Paced: 48.75 %
Brady Statistic RV Percent Paced: 80.17 %
Date Time Interrogation Session: 20200107222611
Implantable Lead Implant Date: 20161227
Implantable Lead Implant Date: 20161227
Implantable Lead Location: 753859
Implantable Lead Location: 753860
Implantable Lead Model: 5076
Implantable Lead Model: 5076
Implantable Pulse Generator Implant Date: 20161227
Lead Channel Impedance Value: 304 Ohm
Lead Channel Impedance Value: 342 Ohm
Lead Channel Impedance Value: 380 Ohm
Lead Channel Impedance Value: 399 Ohm
Lead Channel Pacing Threshold Amplitude: 0.75 V
Lead Channel Pacing Threshold Amplitude: 1.125 V
Lead Channel Pacing Threshold Pulse Width: 0.4 ms
Lead Channel Pacing Threshold Pulse Width: 0.4 ms
Lead Channel Sensing Intrinsic Amplitude: 13.125 mV
Lead Channel Sensing Intrinsic Amplitude: 13.125 mV
Lead Channel Sensing Intrinsic Amplitude: 2 mV
Lead Channel Sensing Intrinsic Amplitude: 2 mV
Lead Channel Setting Pacing Amplitude: 2 V
Lead Channel Setting Pacing Amplitude: 2.5 V
Lead Channel Setting Pacing Pulse Width: 0.4 ms
Lead Channel Setting Sensing Sensitivity: 2.8 mV

## 2018-06-30 NOTE — Progress Notes (Signed)
Remote pacemaker transmission.

## 2018-07-01 ENCOUNTER — Inpatient Hospital Stay: Payer: Medicare Other

## 2018-07-04 ENCOUNTER — Ambulatory Visit (INDEPENDENT_AMBULATORY_CARE_PROVIDER_SITE_OTHER): Payer: Medicare Other | Admitting: Orthopaedic Surgery

## 2018-07-04 ENCOUNTER — Ambulatory Visit (INDEPENDENT_AMBULATORY_CARE_PROVIDER_SITE_OTHER): Payer: Self-pay

## 2018-07-04 ENCOUNTER — Encounter (INDEPENDENT_AMBULATORY_CARE_PROVIDER_SITE_OTHER): Payer: Self-pay | Admitting: Orthopaedic Surgery

## 2018-07-04 VITALS — BP 135/70 | HR 68 | Wt 264.0 lb

## 2018-07-04 DIAGNOSIS — M5441 Lumbago with sciatica, right side: Secondary | ICD-10-CM

## 2018-07-04 DIAGNOSIS — M5442 Lumbago with sciatica, left side: Secondary | ICD-10-CM

## 2018-07-04 DIAGNOSIS — G8929 Other chronic pain: Secondary | ICD-10-CM | POA: Diagnosis not present

## 2018-07-04 DIAGNOSIS — M25552 Pain in left hip: Secondary | ICD-10-CM | POA: Diagnosis not present

## 2018-07-04 NOTE — Progress Notes (Signed)
Office Visit Note   Patient: Tricia Gonzales           Date of Birth: 17-Oct-1940           MRN: [id number] Visit Date: 07/04/2018              Requested by: Antony Contras, MD [place] [place], [place] [address] PCP: Carol Ada, MD   Assessment & Plan: Visit Diagnoses:  1. Pain in left hip   2. Chronic bilateral low back pain with bilateral sciatica     Plan: Chronic back buttock left groin and left leg pain could have several etiologies.  Has significant spondylolisthesis of L5 on S1 with probable stenosis.  Also has some degenerative change about the left hip which may be causing the groin pain.  Long discussion over 45 minutes regarding all of the above treatment options.  We will try intra-articular cortisone injection left hip as a diagnostic and therapeutic modality and monitor response.  Consider MRI scan of lumbar spine if no improvement or MRI of pelvis  Follow-Up Instructions: Return after left hip injection.   Orders:  Orders Placed This Encounter  Procedures  . XR HIP UNILAT W OR W/O PELVIS 2-3 VIEWS LEFT  . XR Lumbar Spine 2-3 Views  . Ambulatory referral to Physical Medicine Rehab   No orders of the defined types were placed in this encounter.     Procedures: No procedures performed   Clinical Data: No additional findings.   Subjective: Chief Complaint  Patient presents with  . Groin Pain    constant pain when walking making the Lt  hip/leg for about 3 weeks.  Tricia Gonzales is 78 years old and visits the office evaluation of multiple joint complaints.  She has had difficulty with low back pain associated with left-sided paralumbar discomfort buttock pain and discomfort as far distally as her left foot.  She is also had considerable pain in the area of her left groin that compromises her ability to sit in a low chair even get in and out of a car.  She is aware of a problem with her back dating back many years but feels that this "may be  different".  Her biggest problem is left groin pain.  No history of injury or trauma  HPI  Review of Systems  Constitutional: Negative.   HENT: Positive for sinus pain.   Respiratory: Positive for shortness of breath.   Cardiovascular: Positive for leg swelling.  Gastrointestinal: Positive for constipation and diarrhea.  Endocrine: Negative.   Genitourinary: Negative.   Musculoskeletal: Positive for back pain, gait problem and neck stiffness.  Skin: Negative.   Allergic/Immunologic: Positive for food allergies.  Neurological: Positive for weakness.  Hematological: Bruises/bleeds easily.  Psychiatric/Behavioral: Negative.      Objective: Vital Signs: BP 135/70 (BP Location: Left Wrist, Patient Position: Sitting, Cuff Size: Normal)   Pulse 68   Wt 264 lb (119.7 kg)   BMI 54.24 kg/m   Physical Exam Constitutional:      Appearance: She is well-developed.  Eyes:     Pupils: Pupils are equal, round, and reactive to light.  Pulmonary:     Effort: Pulmonary effort is normal.  Skin:    General: Skin is warm and dry.  Neurological:     Mental Status: She is alert and oriented to person, place, and time.  Psychiatric:        Behavior: Behavior normal.     Ortho Exam awake  alert and oriented x3.  Walks with the use of a cane.  Short based gait.  Large legs.  BMI 54 pain with internal/external rotation of both hips.  Has symmetrical decreased motion which could be on the basis of the size of her legs.  Chronic swelling of both of her lower extremities.  Good capillary refill to the toes.  Straight leg raise was negative.  No pain over the lateral aspect of either hip.  No percussible tenderness of the lumbar spine or the sacroiliac joints  Specialty Comments:  No specialty comments available.  Imaging: Xr Hip Unilat W Or W/o Pelvis 2-3 Views Left  Result Date: 07/04/2018 AP the pelvis obtained demonstrating maintenance of hip joint spaces.  There is some chondral sclerosis on  the acetabular side of the joint bilaterally.  Diffuse bowel gas obliterates much of the detail.  Significant sclerosis about the sacroiliac joints bilaterally no evidence of avascular necrosis  Xr Lumbar Spine 2-3 Views  Result Date: 07/04/2018 Films of the lumbar spine were obtained in 2 projections.  There is at least a grade 3 spondylolisthesis of L5 on S1 and slight anterior listhesis of L4 on L5.  Some degenerative changes identified of the disc spaces of T11-12 and T12-L1.  Diffuse calcification of the abdominal aorta without obvious aneurysmal dilatation.  There are facet joint degenerative changes at L3-4 L4-5 and L5-S1 with a mild left lumbar scoliosis some sclerosis about the sacroiliac joints as well    PMFS History: Patient Active Problem List   Diagnosis Date Noted  . Vitamin B12 deficiency   . Type II diabetes mellitus ([place])   . Sjogren's disease ([place])   . Presence of permanent cardiac pacemaker   . Pericarditis   . Osteoarthritis   . Obesity   . Kidney stones   . Intestinal obstruction ([place])   . History of gout   . Hiatal hernia   . History of blood transfusion   . Heart murmur   . GERD (gastroesophageal reflux disease)   . Gastritis   . Esophageal stricture   . Early cataracts, bilateral   . Diverticulosis   . Depression   . Chronic kidney disease (CKD), stage III (moderate) (HCC)   . Chronic back pain   . Atrial fibrillation ([place])   . Arthritis   . Anxiety   . Anemia   . 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 Chest pain at rest   . Heme positive stool   . UTI (urinary tract infection) 08/09/2015  . Acute pericarditis   . Chest pain 08/06/2015  . Diabetes mellitus type 2, controlled ([place]) 08/06/2015  . Hyperlipidemia 08/06/2015  . Hypertension 08/06/2015  . CKD (chronic kidney disease) stage 3, GFR 30-59 ml/min (HCC) 08/06/2015  . Chronic anemia 08/06/2015  . Hyperkalemia   . Pain in the chest   . Pacemaker   . 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 Osteoporosis 03/22/2015  . Abdominal pain, left lower quadrant 01/07/2012  . Syncope 09/15/2011  . Junctional escape rhythm 09/15/2011  . PELVIC MASS 03/27/2009  . ABSCESS OF INTESTINE 02/19/2009  . VITAMIN B12 DEFICIENCY 11/21/2007  . OBESITY 11/21/2007  . ESOPHAGEAL STRICTURE 11/21/2007  . HIATAL HERNIA 11/21/2007  . UNSPECIFIED INTESTINAL OBSTRUCTION 11/21/2007  . DIVERTICULOSIS OF COLON 11/21/2007  . OSTEOARTHRITIS 11/21/2007  . DIABETES MELLITUS, HX OF 11/21/2007  . ANEMIA, HX OF 11/21/2007  . HYPERTENSION, HX OF 11/21/2007  . REFLUX ESOPHAGITIS, HX OF 11/21/2007   Past Medical History:  Diagnosis Date  . Anemia   . 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 Anxiety   . Arthritis   . Atrial fibrillation ([place])   . Chronic back pain   . Chronic kidney disease (CKD), stage III (moderate) (HCC)   . Depression   . Diverticulosis   . Early cataracts, bilateral   . Esophageal stricture   . Gastritis   . GERD (gastroesophageal reflux disease)   . Heart murmur     " some doctors say that I have one some say that I don"t "  . Hiatal hernia   . History of blood transfusion    "@ least w/1st knee OR"  . History of gout   . Hyperlipidemia   . Hypertension   . Intestinal obstruction ([place])   . Kidney stones   . Obesity   . Osteoarthritis   . Pericarditis   . Presence of permanent cardiac pacemaker   . Sjogren's disease ([place])   . Type II diabetes mellitus ([place])   . Vitamin B12 deficiency     Family History  Problem Relation Age of Onset  . Diabetes Mother   . Diabetes Brother   . Hypertension Sister   . Colon cancer Neg Hx   . Heart attack Neg Hx   . Stroke Neg Hx     Past Surgical History:  Procedure Laterality Date  . ABDOMINAL HYSTERECTOMY    . CHOLECYSTECTOMY OPEN    . COLECTOMY     for rectovaginal fistula  . COLONOSCOPY  01/07/2012   Procedure: COLONOSCOPY;  Surgeon: Lafayette Dragon, MD;  Location: [facility];  Service: Endoscopy;   Laterality: N/A;  . EP IMPLANTABLE DEVICE N/A 06/18/2015   Procedure: Pacemaker Implant;  Surgeon: Evans Lance, MD;  Location: [facility];  Service: Cardiovascular;  Laterality: N/A;  . ESOPHAGOGASTRODUODENOSCOPY (EGD) WITH ESOPHAGEAL DILATION    . ESOPHAGOGASTRODUODENOSCOPY (EGD) WITH PROPOFOL N/A 08/12/2015   Procedure: ESOPHAGOGASTRODUODENOSCOPY (EGD) WITH PROPOFOL;  Surgeon: Manus Gunning, MD;  Location: [facility];  Service: Gastroenterology;  Laterality: N/A;  . I&D EXTREMITY Right 08/06/2016   Procedure: DEBRIDEMENT PIP RIGHT RING FINGER;  Surgeon: Daryll Brod, MD;  Location: [facility];  Service: Orthopedics;  Laterality: Right;  . INSERT / REPLACE / REMOVE PACEMAKER    . JOINT REPLACEMENT    . KNEE ARTHROSCOPY Right   . MASS EXCISION Right 08/06/2016   Procedure: EXCISION CYST;  Surgeon: Daryll Brod, MD;  Location: [facility];  Service: Orthopedics;  Laterality: Right;  . REVISION TOTAL KNEE ARTHROPLASTY Left   . TOTAL KNEE ARTHROPLASTY Bilateral    Social History   Occupational History  . Not on file  Tobacco Use  . Smoking status: Former Smoker    Packs/day: 2.00    Years: 3.00    Pack years: 6.00    Types: Cigarettes    Last attempt to quit: 06/23/1983    Years since quitting: 35.0  . Smokeless tobacco: Never Used  Substance and Sexual Activity  . Alcohol use: Yes    Alcohol/week: 2.0 standard drinks    Types: 2 Glasses of wine per week    Comment: occ  . Drug use: No  . Sexual activity: Never

## 2018-07-05 ENCOUNTER — Inpatient Hospital Stay: Payer: Medicare Other

## 2018-07-05 ENCOUNTER — Inpatient Hospital Stay (HOSPITAL_BASED_OUTPATIENT_CLINIC_OR_DEPARTMENT_OTHER): Payer: Medicare Other | Admitting: Nurse Practitioner

## 2018-07-05 VITALS — BP 150/56

## 2018-07-05 VITALS — BP 156/44 | HR 72 | Temp 97.8°F | Resp 18 | Ht 58.5 in | Wt 255.2 lb

## 2018-07-05 DIAGNOSIS — N183 Chronic kidney disease, stage 3 unspecified: Secondary | ICD-10-CM

## 2018-07-05 DIAGNOSIS — D631 Anemia in chronic kidney disease: Secondary | ICD-10-CM

## 2018-07-05 DIAGNOSIS — N189 Chronic kidney disease, unspecified: Secondary | ICD-10-CM | POA: Diagnosis not present

## 2018-07-05 DIAGNOSIS — E119 Type 2 diabetes mellitus without complications: Secondary | ICD-10-CM

## 2018-07-05 DIAGNOSIS — I4891 Unspecified atrial fibrillation: Secondary | ICD-10-CM

## 2018-07-05 LAB — CBC WITH DIFFERENTIAL (CANCER CENTER ONLY)
Abs Immature Granulocytes: 0.03 10*3/uL (ref 0.00–0.07)
Basophils Absolute: 0 10*3/uL (ref 0.0–0.1)
Basophils Relative: 0 %
Eosinophils Absolute: 0.1 10*3/uL (ref 0.0–0.5)
Eosinophils Relative: 1 %
HCT: 25.6 % — ABNORMAL LOW (ref 36.0–46.0)
Hemoglobin: 7.6 g/dL — ABNORMAL LOW (ref 12.0–15.0)
Immature Granulocytes: 0 %
Lymphocytes Relative: 7 %
Lymphs Abs: 0.6 10*3/uL — ABNORMAL LOW (ref 0.7–4.0)
MCH: 30.3 pg (ref 26.0–34.0)
MCHC: 29.7 g/dL — ABNORMAL LOW (ref 30.0–36.0)
MCV: 102 fL — ABNORMAL HIGH (ref 80.0–100.0)
Monocytes Absolute: 0.5 10*3/uL (ref 0.1–1.0)
Monocytes Relative: 6 %
Neutro Abs: 6.9 10*3/uL (ref 1.7–7.7)
Neutrophils Relative %: 86 %
Platelet Count: 296 10*3/uL (ref 150–400)
RBC: 2.51 MIL/uL — ABNORMAL LOW (ref 3.87–5.11)
RDW: 16.8 % — ABNORMAL HIGH (ref 11.5–15.5)
WBC Count: 8 10*3/uL (ref 4.0–10.5)
nRBC: 0 % (ref 0.0–0.2)

## 2018-07-05 LAB — CBC WITH DIFFERENTIAL/PLATELET
Abs Immature Granulocytes: 0.04 10*3/uL (ref 0.00–0.07)
Basophils Absolute: 0 10*3/uL (ref 0.0–0.1)
Basophils Relative: 0 %
Eosinophils Absolute: 0.1 10*3/uL (ref 0.0–0.5)
Eosinophils Relative: 1 %
HCT: 24.8 % — ABNORMAL LOW (ref 36.0–46.0)
Hemoglobin: 7.4 g/dL — ABNORMAL LOW (ref 12.0–15.0)
Immature Granulocytes: 0 %
Lymphocytes Relative: 7 %
Lymphs Abs: 0.6 10*3/uL — ABNORMAL LOW (ref 0.7–4.0)
MCH: 30.2 pg (ref 26.0–34.0)
MCHC: 29.8 g/dL — ABNORMAL LOW (ref 30.0–36.0)
MCV: 101.2 fL — ABNORMAL HIGH (ref 80.0–100.0)
Monocytes Absolute: 0.5 10*3/uL (ref 0.1–1.0)
Monocytes Relative: 5 %
Neutro Abs: 8 10*3/uL — ABNORMAL HIGH (ref 1.7–7.7)
Neutrophils Relative %: 87 %
Platelets: 272 10*3/uL (ref 150–400)
RBC: 2.45 MIL/uL — ABNORMAL LOW (ref 3.87–5.11)
RDW: 16.7 % — ABNORMAL HIGH (ref 11.5–15.5)
WBC: 9.2 10*3/uL (ref 4.0–10.5)
nRBC: 0 % (ref 0.0–0.2)

## 2018-07-05 LAB — CMP (CANCER CENTER ONLY)
ALT: 8 U/L (ref 0–44)
AST: 12 U/L — ABNORMAL LOW (ref 15–41)
Albumin: 3.3 g/dL — ABNORMAL LOW (ref 3.5–5.0)
Alkaline Phosphatase: 59 U/L (ref 38–126)
Anion gap: 8 (ref 5–15)
BUN: 62 mg/dL — ABNORMAL HIGH (ref 8–23)
CO2: 28 mmol/L (ref 22–32)
Calcium: 8.9 mg/dL (ref 8.9–10.3)
Chloride: 104 mmol/L (ref 98–111)
Creatinine: 1.69 mg/dL — ABNORMAL HIGH (ref 0.44–1.00)
GFR, Est AFR Am: 34 mL/min — ABNORMAL LOW (ref >60)
GFR, Estimated: 29 mL/min — ABNORMAL LOW (ref >60)
Glucose, Bld: 159 mg/dL — ABNORMAL HIGH (ref 70–99)
Potassium: 4.4 mmol/L (ref 3.5–5.1)
Sodium: 140 mmol/L (ref 135–145)
Total Bilirubin: 0.3 mg/dL (ref 0.3–1.2)
Total Protein: 6.1 g/dL — ABNORMAL LOW (ref 6.5–8.1)

## 2018-07-05 LAB — IRON AND TIBC
Iron: 46 ug/dL (ref 41–142)
Saturation Ratios: 15 % — ABNORMAL LOW (ref 21–57)
TIBC: 308 ug/dL (ref 236–444)
UIBC: 261 ug/dL (ref 120–384)

## 2018-07-05 LAB — FERRITIN: Ferritin: 168 ng/mL (ref 11–307)

## 2018-07-05 MED ORDER — DARBEPOETIN ALFA 200 MCG/0.4ML IJ SOSY
200.0000 ug | PREFILLED_SYRINGE | Freq: Once | INTRAMUSCULAR | Status: AC
  Administered 2018-07-05: 200 ug via SUBCUTANEOUS

## 2018-07-05 MED ORDER — DARBEPOETIN ALFA 200 MCG/0.4ML IJ SOSY
PREFILLED_SYRINGE | INTRAMUSCULAR | Status: AC
  Filled 2018-07-05: qty 0.4

## 2018-07-05 NOTE — Progress Notes (Addendum)
[name] OFFICE PROGRESS NOTE   Diagnosis: Anemia, renal insufficiency  INTERVAL HISTORY:   Tricia Gonzales returns as scheduled.  She continues Aranesp every 2 weeks, last injection 06/21/2018.  She received a dose of Feraheme 06/24/2018.  She canceled the second Feraheme infusion due to back pain.  She reports she is scheduled for a cortisone injection.  She is having difficulty maneuvering in and out of her car due to the back pain.  No leg weakness or numbness.  She denies any bleeding.  Stools are dark which she attributes to oral iron.  No interim illnesses or infections.  No fevers or sweats.  She has intermittent dyspnea on exertion.  She has lost some weight over the past 2 months, approximately 9 pounds.  She attributes this to eating less during the holidays.  Objective:  Vital signs in last 24 hours:  Blood pressure (!) 156/44, pulse 72, temperature 97.8 F (36.6 C), temperature source Oral, resp. rate 18, height 4' 10.5" (1.486 m), weight 255 lb 3.2 oz (115.8 kg), SpO2 98 %.    Resp: Lungs clear bilaterally. Cardio: Irregular. GI: Abdomen soft and nontender.  No hepatomegaly. Vascular: Trace bilateral lower leg edema.   Lab Results:  Lab Results  Component Value Date   WBC 8.0 07/05/2018   HGB 7.6 (L) 07/05/2018   HCT 25.6 (L) 07/05/2018   MCV 102.0 (H) 07/05/2018   PLT 296 07/05/2018   NEUTROABS 6.9 07/05/2018    Imaging:  Xr Hip Unilat W Or W/o Pelvis 2-3 Views Left  Result Date: 07/04/2018 AP the pelvis obtained demonstrating maintenance of hip joint spaces.  There is some chondral sclerosis on the acetabular side of the joint bilaterally.  Diffuse bowel gas obliterates much of the detail.  Significant sclerosis about the sacroiliac joints bilaterally no evidence of avascular necrosis  Xr Lumbar Spine 2-3 Views  Result Date: 07/04/2018 Films of the lumbar spine were obtained in 2 projections.  There is at least a grade 3 spondylolisthesis of L5  on S1 and slight anterior listhesis of L4 on L5.  Some degenerative changes identified of the disc spaces of T11-12 and T12-L1.  Diffuse calcification of the abdominal aorta without obvious aneurysmal dilatation.  There are facet joint degenerative changes at L3-4 L4-5 and L5-S1 with a mild left lumbar scoliosis some sclerosis about the sacroiliac joints as well   Medications: I have reviewed the patient's current medications.  Assessment/Plan: 1. 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; quantitative immunoglobulins show IgG mildly decreased 654 (range [phone]), IgA and IgM in normal range; polyclonal serum light chain elevation  11/15/2015-hemoglobin 11.2  11/29/2015-hemoglobin 11.1  12/12/2005-hemoglobin 10.4; Aranesp 100 g every 2 weeks initiated  02/07/2016-hemoglobin 11.0, Aranesp held, changed to an every 3 week schedule  Progressive anemia and decreased iron stores December 2017, treated with IV iron January 2018 with improvement  She did not require Aranesp for 2 months and resumed Aranesp on a monthly schedule for 08/2016  Aranesp every 6 weeks beginning 05/04/2017  Progressive anemia and decreased iron stores 07/05/2017; IV irongiven2/11/2017 and 08/06/2017  Persistent severe anemia, Aranesp change to every 2 weeks beginning 10/19/2017  Persistent anemia,  decreased iron stores 11/30/2017  IV iron 12/13/2017, 12/20/2017, 06/24/2018  2. Chronic renal insufficiency  3. Atrial fibrillation-maintained on apixaban  4. Osteoarthritis  5.  Diabetes  6. Sjogren's disease  7. Pericarditis February 2017   Disposition: Ms. Wisdom appears unchanged.  We reviewed the CBC from today.  She has progressive anemia despite Aranesp/iron.  She will complete a set of stool cards.  She will return to the lab today for additional blood work to include SPEP, light chains and quantitative immunoglobulins.  We will plan to see her back in 2 weeks.  She understands to contact the office if she develops symptoms of progressive anemia.  Patient seen with Dr. Benay Spice.  25 minutes were spent face-to-face at today's visit with the majority of that time involved in counseling/coordination of care.    Ned Card ANP/GNP-BC   07/05/2018  12:13 PM This was a shared visit with Ned Card.  [name] has persistent severe anemia.  The anemia has partially responded to erythropoetin therapy and iron over the past few years.  We will repeat a myeloma panel to be sure there is no evidence of myeloma.  She will return stool hemmocult cards.  Plan is to consider a bmbx if the anemia does not improve with iron and continued aranesp.  Julieanne Manson, MD

## 2018-07-06 LAB — IGG, IGA, IGM
IgA: 322 mg/dL (ref 64–422)
IgG (Immunoglobin G), Serum: 612 mg/dL — ABNORMAL LOW (ref 700–1600)
IgM (Immunoglobulin M), Srm: 44 mg/dL (ref 26–217)

## 2018-07-06 LAB — KAPPA/LAMBDA LIGHT CHAINS
Kappa free light chain: 51 mg/L — ABNORMAL HIGH (ref 3.3–19.4)
Kappa, lambda light chain ratio: 1.14 (ref 0.26–1.65)
Lambda free light chains: 44.6 mg/L — ABNORMAL HIGH (ref 5.7–26.3)

## 2018-07-07 LAB — PROTEIN ELECTROPHORESIS, SERUM, WITH REFLEX
A/G Ratio: 1.3 (ref 0.7–1.7)
Albumin ELP: 3.2 g/dL (ref 2.9–4.4)
Alpha-1-Globulin: 0.3 g/dL (ref 0.0–0.4)
Alpha-2-Globulin: 0.8 g/dL (ref 0.4–1.0)
Beta Globulin: 0.9 g/dL (ref 0.7–1.3)
Gamma Globulin: 0.5 g/dL (ref 0.4–1.8)
Globulin, Total: 2.4 g/dL (ref 2.2–3.9)
Total Protein ELP: 5.6 g/dL — ABNORMAL LOW (ref 6.0–8.5)

## 2018-07-11 ENCOUNTER — Telehealth: Payer: Self-pay | Admitting: Oncology

## 2018-07-11 NOTE — Telephone Encounter (Signed)
Scheduled appt per 1/14 los - called patient to confirm appts - pt is aware of appt date and time

## 2018-07-14 ENCOUNTER — Encounter (INDEPENDENT_AMBULATORY_CARE_PROVIDER_SITE_OTHER): Payer: Self-pay | Admitting: Physical Medicine and Rehabilitation

## 2018-07-14 ENCOUNTER — Ambulatory Visit (INDEPENDENT_AMBULATORY_CARE_PROVIDER_SITE_OTHER): Payer: Self-pay

## 2018-07-14 ENCOUNTER — Ambulatory Visit (INDEPENDENT_AMBULATORY_CARE_PROVIDER_SITE_OTHER): Payer: Medicare Other | Admitting: Physical Medicine and Rehabilitation

## 2018-07-14 DIAGNOSIS — M25552 Pain in left hip: Secondary | ICD-10-CM

## 2018-07-14 NOTE — Progress Notes (Signed)
 .  Numeric Pain Rating Scale and Functional Assessment Average Pain 8   In the last MONTH (on 0-10 scale) has pain interfered with the following?  1. General activity like being  able to carry out your everyday physical activities such as walking, climbing stairs, carrying groceries, or moving a chair?  Rating(6)   -Dye Allergies.

## 2018-07-14 NOTE — Progress Notes (Signed)
   Tricia Gonzales - 78 y.o. female MRN [id number]  Date of birth: [birth year]  Office Visit Note: Visit Date: 07/14/2018 PCP: Carol Ada, MD Referred by: Carol Ada, MD  Subjective: Chief Complaint  Patient presents with  . Left Hip - Pain  . Left Leg - Pain   HPI:  Tricia Gonzales is a 78 y.o. female who comes in today At the request of Dr. Joni Fears for diagnostic and hopefully therapeutic anesthetic hip arthrogram on the left.  Patient's had a lot of back and hip pain with question of a source of her pain.  She does have left groin pain but also has pain down past the knee as well.  This all started about 3 weeks ago.  She reports lifting her leg does make it worse and laying flat makes it better.  She is using a cane for ambulation and rates her pain as an 8 out of 10.  ROS Otherwise per HPI.  Assessment & Plan: Visit Diagnoses:  1. Pain in left hip     Plan: Findings:  Diagnostic and hopefully therapeutic anesthetic hip arthrogram on the left.  Patient did seem to get some relief during the anesthetic phase.    Meds & Orders: No orders of the defined types were placed in this encounter.   Orders Placed This Encounter  Procedures  . Large Joint Inj: L hip joint  . XR C-ARM NO REPORT    Follow-up: No follow-ups on file.   Procedures: Large Joint Inj: L hip joint on 07/14/2018 9:24 AM Indications: pain and diagnostic evaluation Details: 22 G needle, anterior approach  Arthrogram: Yes  Medications: 80 mg triamcinolone acetonide 40 MG/ML; 3 mL bupivacaine 0.5 % Outcome: tolerated well, no immediate complications  Arthrogram demonstrated excellent flow of contrast throughout the joint surface without extravasation or obvious defect.  The patient had relief of symptoms during the anesthetic phase of the injection.  Procedure, treatment alternatives, risks and benefits explained, specific risks discussed. Consent was given by the patient. Immediately prior to  procedure a time out was called to verify the correct patient, procedure, equipment, support staff and site/side marked as required. Patient was prepped and draped in the usual sterile fashion.      No notes on file   Clinical History: No specialty comments available.     Objective:  VS:  HT:    WT:   BMI:     BP:   HR: bpm  TEMP: ( )  RESP:  Physical Exam  Ortho Exam Imaging: No results found.

## 2018-07-18 MED ORDER — BUPIVACAINE HCL 0.5 % IJ SOLN
3.0000 mL | INTRAMUSCULAR | Status: AC | PRN
  Administered 2018-07-14: 3 mL via INTRA_ARTICULAR

## 2018-07-18 MED ORDER — TRIAMCINOLONE ACETONIDE 40 MG/ML IJ SUSP
80.0000 mg | INTRAMUSCULAR | Status: AC | PRN
  Administered 2018-07-14: 80 mg via INTRA_ARTICULAR

## 2018-07-19 ENCOUNTER — Ambulatory Visit: Payer: Medicare Other | Admitting: Nurse Practitioner

## 2018-07-19 ENCOUNTER — Other Ambulatory Visit: Payer: Medicare Other

## 2018-07-19 ENCOUNTER — Ambulatory Visit (INDEPENDENT_AMBULATORY_CARE_PROVIDER_SITE_OTHER): Payer: Medicare Other | Admitting: Internal Medicine

## 2018-07-19 ENCOUNTER — Ambulatory Visit: Payer: Medicare Other

## 2018-07-19 ENCOUNTER — Encounter: Payer: Self-pay | Admitting: Internal Medicine

## 2018-07-19 VITALS — BP 118/60 | HR 85 | Ht 58.5 in | Wt 247.0 lb

## 2018-07-19 DIAGNOSIS — Z950 Presence of cardiac pacemaker: Secondary | ICD-10-CM | POA: Diagnosis not present

## 2018-07-19 DIAGNOSIS — I442 Atrioventricular block, complete: Secondary | ICD-10-CM

## 2018-07-19 NOTE — Progress Notes (Signed)
HPI Tricia Gonzales returns today for evaluation of sinus node dysfunction s/p PPM insertion. She has morbid obesity and has had trouble with her ambulation and is now wheel chair bound. She also has HTN and chronic renal insufficiency. No syncope. She has a remote h/o atrial fib.  Allergies  Allergen Reactions  . Norvasc [Amlodipine Besylate] Shortness Of Breath  . Avapro [Irbesartan]     Headaches  . Glucophage [Metformin Hydrochloride] Other (See Comments)    "increases creatinine"  . Lisinopril     Headache   . Losartan     Headache   . Morphine And Related Other (See Comments)    "hallucinations"  . Simvastatin Other (See Comments)    Body aches  . Trulicity [Dulaglutide] Other (See Comments)    Severe mood swings     Current Outpatient Medications  Medication Sig Dispense Refill  . ACCU-CHEK AVIVA PLUS test strip 1 each by Other route daily.     Marland Kitchen acetaminophen (TYLENOL) 650 MG CR tablet Take 1,300 mg by mouth at bedtime as needed for pain.    Marland Kitchen denosumab (PROLIA) 60 MG/ML SOLN injection Inject 60 mg into the skin every 6 (six) months. Administer in upper arm, thigh, or abdomen    . dorzolamide (TRUSOPT) 2 % ophthalmic solution     . doxazosin (CARDURA) 4 MG tablet Take 4 mg by mouth daily.    Marland Kitchen ELIQUIS 5 MG TABS tablet TAKE ONE TABLET TWICE DAILY 180 tablet 1  . Epoetin Alfa (PROCRIT IJ) Inject 1 Dose as directed every 14 (fourteen) days.     . febuxostat (ULORIC) 40 MG tablet Take 40 mg by mouth 3 (three) times a week.    . ferrous sulfate 325 (65 FE) MG EC tablet Take 1 tablet (325 mg total) by mouth 2 (two) times daily. 60 tablet 3  . furosemide (LASIX) 40 MG tablet Take 40 mg daily by mouth.    . gabapentin (NEURONTIN) 300 MG capsule Take 300 mg by mouth daily.     Marland Kitchen gabapentin (NEURONTIN) 400 MG capsule     . HYDROcodone-acetaminophen (NORCO) 5-325 MG tablet Take 1 tablet by mouth every 6 (six) hours as needed for moderate pain. 30 tablet 0  . JANUVIA 50 MG  tablet Take 50 mg by mouth daily.     Marland Kitchen levothyroxine (SYNTHROID, LEVOTHROID) 50 MCG tablet Take 50 mcg daily by mouth.    . metoprolol succinate (TOPROL-XL) 25 MG 24 hr tablet TAKE ONE AND ONE-HALF TABLETS DAILY 45 tablet 11  . montelukast (SINGULAIR) 10 MG tablet Take 10 mg by mouth at bedtime.     . Multiple Vitamin (MULITIVITAMIN WITH MINERALS) TABS Take 1 tablet by mouth daily.    . pantoprazole (PROTONIX) 40 MG tablet Take 1 tablet (40 mg total) by mouth daily. 30 tablet 1  . pentoxifylline (TRENTAL) 400 MG CR tablet Take 400 mg by mouth daily.    . Polyvinyl Alcohol-Povidone (REFRESH OP) Apply to eye.    Nelva Nay SOLOSTAR 300 UNIT/ML SOPN Inject 22 Units into the skin every evening.      No current facility-administered medications for this visit.      Past Medical History:  Diagnosis Date  . Anemia   . 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 Anxiety   . Arthritis   . Atrial fibrillation ([place])   . Chronic back pain   . Chronic kidney disease (CKD), stage III (moderate) (HCC)   . Depression   .  Diverticulosis   . Early cataracts, bilateral   . Esophageal stricture   . Gastritis   . GERD (gastroesophageal reflux disease)   . Heart murmur     " some doctors say that I have one some say that I don"t "  . Hiatal hernia   . History of blood transfusion    "@ least w/1st knee OR"  . History of gout   . Hyperlipidemia   . Hypertension   . Intestinal obstruction ([place])   . Kidney stones   . Obesity   . Osteoarthritis   . Pericarditis   . Presence of permanent cardiac pacemaker   . Sjogren's disease ([place])   . Type II diabetes mellitus ([place])   . Vitamin B12 deficiency     ROS:   All systems reviewed and negative except as noted in the HPI.   Past Surgical History:  Procedure Laterality Date  . ABDOMINAL HYSTERECTOMY    . CHOLECYSTECTOMY OPEN    . COLECTOMY     for rectovaginal fistula  . COLONOSCOPY  01/07/2012   Procedure: COLONOSCOPY;  Surgeon: Lafayette Dragon, MD;  [facility];  Service: Endoscopy;  Laterality: N/A;  . EP IMPLANTABLE DEVICE N/A 06/18/2015   Procedure: Pacemaker Implant;  Surgeon: Evans Lance, MD;  Location: [facility];  Service: Cardiovascular;  Laterality: N/A;  . ESOPHAGOGASTRODUODENOSCOPY (EGD) WITH ESOPHAGEAL DILATION    . ESOPHAGOGASTRODUODENOSCOPY (EGD) WITH PROPOFOL N/A 08/12/2015   Procedure: ESOPHAGOGASTRODUODENOSCOPY (EGD) WITH PROPOFOL;  Surgeon: Manus Gunning, MD;  Location: [facility];  Service: Gastroenterology;  Laterality: N/A;  . I&D EXTREMITY Right 08/06/2016   Procedure: DEBRIDEMENT PIP RIGHT RING FINGER;  Surgeon: Daryll Brod, MD;  Location: [facility];  Service: Orthopedics;  Laterality: Right;  . INSERT / REPLACE / REMOVE PACEMAKER    . JOINT REPLACEMENT    . KNEE ARTHROSCOPY Right   . MASS EXCISION Right 08/06/2016   Procedure: EXCISION CYST;  Surgeon: Daryll Brod, MD;  Location: [facility];  Service: Orthopedics;  Laterality: Right;  . REVISION TOTAL KNEE ARTHROPLASTY Left   . TOTAL KNEE ARTHROPLASTY Bilateral      Family History  Problem Relation Age of Onset  . Diabetes Mother   . Diabetes Brother   . Hypertension Sister   . Colon cancer Neg Hx   . Heart attack Neg Hx   . Stroke Neg Hx      Social History   Socioeconomic History  . Marital status: Divorced    Spouse name: Not on file  . Number of children: 2  . Years of education: Not on file  . Highest education level: Not on file  Occupational History  . Not on file  Social Needs  . Financial resource strain: Not on file  . Food insecurity:    Worry: Not on file    Inability: Not on file  . Transportation needs:    Medical: Not on file    Non-medical: Not on file  Tobacco Use  . Smoking status: Former Smoker    Packs/day: 2.00    Years: 3.00    Pack years: 6.00    Types: Cigarettes    Last attempt to quit: 06/23/1983    Years since quitting: 35.0  . Smokeless tobacco: Never Used  Substance and  Sexual Activity  . Alcohol use: Yes    Alcohol/week: 2.0 standard drinks    Types: 2 Glasses of wine per week    Comment: occ  . Drug  use: No  . Sexual activity: Never  Lifestyle  . Physical activity:    Days per week: Not on file    Minutes per session: Not on file  . Stress: Not on file  Relationships  . Social connections:    Talks on phone: Not on file    Gets together: Not on file    Attends religious service: Not on file    Active member of club or organization: Not on file    Attends meetings of clubs or organizations: Not on file    Relationship status: Not on file  . Intimate partner violence:    Fear of current or ex partner: Not on file    Emotionally abused: Not on file    Physically abused: Not on file    Forced sexual activity: Not on file  Other Topics Concern  . Not on file  Social History Narrative  . Not on file     BP 118/60   Pulse 85   Ht 4' 10.5" (1.486 m)   Wt 247 lb (112 kg)   SpO2 94%   BMI 50.74 kg/m   Physical Exam:  Well appearing NAD HEENT: Unremarkable Neck:  No JVD, no thyromegally Lymphatics:  No adenopathy Back:  No CVA tenderness Lungs:  Clear HEART:  Regular rate rhythm, no murmurs, no rubs, no clicks Abd:  soft, positive bowel sounds, no organomegally, no rebound, no guarding Ext:  2 plus pulses, no edema, no cyanosis, no clubbing Skin:  No rashes no nodules Neuro:  CN II through XII intact, motor grossly intact  EKG - looks like atrial fib but nsr.   DEVICE  Normal device function.  See PaceArt for details.   Assess/Plan: 1. Sinus node dysfunction - she is asymptomatic, s/p PPM insertion. 2. HTN - her bp is ok today.  3. Massive obesity - her bmi is 51. I have encouraged her to lose weight. 4. PAF - even though her ECG looks like atrial fib today, she has not had any in over a year. She will continue her [place].  Mikle Bosworth.D.

## 2018-07-19 NOTE — Patient Instructions (Signed)
Medication Instructions:  Your physician recommends that you continue on your current medications as directed. Please refer to the Current Medication list given to you today.  Labwork: None ordered.  Testing/Procedures: None ordered.  Follow-Up: Your physician wants you to follow-up in: one year with Dr. Lovena Le.  You will receive a reminder letter in the mail two months in advance. If you don't receive a letter, please call our office to schedule the follow-up appointment.  Remote monitoring is used to monitor your Pacemaker from home. This monitoring reduces the number of office visits required to check your device to one time per year. It allows [place] to keep an eye on the functioning of your device to ensure it is working properly. You are scheduled for a device check from home on 09/27/2018. You may send your transmission at any time that day. If you have a wireless device, the transmission will be sent automatically. After your physician reviews your transmission, you will receive a postcard with your next transmission date.  Any Other Special Instructions Will Be Listed Below (If Applicable).  If you need a refill on your cardiac medications before your next appointment, please call your pharmacy.

## 2018-07-20 ENCOUNTER — Inpatient Hospital Stay (HOSPITAL_BASED_OUTPATIENT_CLINIC_OR_DEPARTMENT_OTHER): Payer: Medicare Other | Admitting: Nurse Practitioner

## 2018-07-20 ENCOUNTER — Inpatient Hospital Stay: Payer: Medicare Other

## 2018-07-20 ENCOUNTER — Telehealth (INDEPENDENT_AMBULATORY_CARE_PROVIDER_SITE_OTHER): Payer: Self-pay | Admitting: Orthopaedic Surgery

## 2018-07-20 ENCOUNTER — Encounter: Payer: Self-pay | Admitting: Nurse Practitioner

## 2018-07-20 ENCOUNTER — Other Ambulatory Visit (INDEPENDENT_AMBULATORY_CARE_PROVIDER_SITE_OTHER): Payer: Self-pay | Admitting: *Deleted

## 2018-07-20 ENCOUNTER — Telehealth: Payer: Self-pay

## 2018-07-20 VITALS — BP 137/73 | HR 73 | Temp 98.0°F | Resp 17 | Ht 58.5 in | Wt 244.6 lb

## 2018-07-20 DIAGNOSIS — N189 Chronic kidney disease, unspecified: Secondary | ICD-10-CM

## 2018-07-20 DIAGNOSIS — I4891 Unspecified atrial fibrillation: Secondary | ICD-10-CM

## 2018-07-20 DIAGNOSIS — M5442 Lumbago with sciatica, left side: Principal | ICD-10-CM

## 2018-07-20 DIAGNOSIS — D631 Anemia in chronic kidney disease: Secondary | ICD-10-CM

## 2018-07-20 DIAGNOSIS — N183 Chronic kidney disease, stage 3 unspecified: Secondary | ICD-10-CM

## 2018-07-20 DIAGNOSIS — G8929 Other chronic pain: Secondary | ICD-10-CM

## 2018-07-20 DIAGNOSIS — E1122 Type 2 diabetes mellitus with diabetic chronic kidney disease: Secondary | ICD-10-CM

## 2018-07-20 DIAGNOSIS — D509 Iron deficiency anemia, unspecified: Secondary | ICD-10-CM | POA: Diagnosis not present

## 2018-07-20 LAB — CBC WITH DIFFERENTIAL (CANCER CENTER ONLY)
Abs Immature Granulocytes: 0.06 10*3/uL (ref 0.00–0.07)
Basophils Absolute: 0 10*3/uL (ref 0.0–0.1)
Basophils Relative: 0 %
Eosinophils Absolute: 0 10*3/uL (ref 0.0–0.5)
Eosinophils Relative: 0 %
HCT: 30.5 % — ABNORMAL LOW (ref 36.0–46.0)
Hemoglobin: 9.2 g/dL — ABNORMAL LOW (ref 12.0–15.0)
Immature Granulocytes: 1 %
Lymphocytes Relative: 5 %
Lymphs Abs: 0.5 10*3/uL — ABNORMAL LOW (ref 0.7–4.0)
MCH: 29.8 pg (ref 26.0–34.0)
MCHC: 30.2 g/dL (ref 30.0–36.0)
MCV: 98.7 fL (ref 80.0–100.0)
Monocytes Absolute: 0.5 10*3/uL (ref 0.1–1.0)
Monocytes Relative: 6 %
Neutro Abs: 8.4 10*3/uL — ABNORMAL HIGH (ref 1.7–7.7)
Neutrophils Relative %: 88 %
Platelet Count: 302 10*3/uL (ref 150–400)
RBC: 3.09 MIL/uL — ABNORMAL LOW (ref 3.87–5.11)
RDW: 15.4 % (ref 11.5–15.5)
WBC Count: 9.5 10*3/uL (ref 4.0–10.5)
nRBC: 0 % (ref 0.0–0.2)

## 2018-07-20 LAB — SAMPLE TO BLOOD BANK

## 2018-07-20 LAB — SAVE SMEAR(SSMR), FOR PROVIDER SLIDE REVIEW

## 2018-07-20 MED ORDER — DARBEPOETIN ALFA 200 MCG/0.4ML IJ SOSY
200.0000 ug | PREFILLED_SYRINGE | Freq: Once | INTRAMUSCULAR | Status: AC
  Administered 2018-07-20: 200 ug via SUBCUTANEOUS

## 2018-07-20 MED ORDER — DARBEPOETIN ALFA 200 MCG/0.4ML IJ SOSY
PREFILLED_SYRINGE | INTRAMUSCULAR | Status: AC
  Filled 2018-07-20: qty 0.4

## 2018-07-20 NOTE — Telephone Encounter (Signed)
Please advise.

## 2018-07-20 NOTE — Telephone Encounter (Signed)
poPatient called stating she had a left hip injection with Dr. Ernestina Patches on 07/14/18.  Patient states her left leg is still very painful and is having difficulty walking.  Patient states Dr. Ernestina Patches told her to contact Dr. Durward Fortes.  Patient requested a return call.

## 2018-07-20 NOTE — Telephone Encounter (Signed)
MRI L-S spine

## 2018-07-20 NOTE — Telephone Encounter (Signed)
MRI ordered ASAP, I called patient

## 2018-07-20 NOTE — Progress Notes (Addendum)
  [facility] OFFICE PROGRESS NOTE   Diagnosis: Anemia, renal insufficiency  INTERVAL HISTORY:   Ms. Hege returns as scheduled.  She continues Aranesp, most recent injection 07/05/2018.  She denies any bleeding.  She is not feeling well today due to left hip/leg pain.  She reports having a left hip injection last week.  She has been referred for an MRI.  Objective:  Vital signs in last 24 hours:  Blood pressure 137/73, pulse 73, temperature 98 F (36.7 C), temperature source Oral, resp. rate 17, height 4' 10.5" (1.486 m), weight 244 lb 9.6 oz (110.9 kg), SpO2 98 %.    Resp: No shortness of breath. Cardio: Regular rate and rhythm. GI: Abdomen soft and nontender. Vascular: No leg edema. Neuro: Leg strength grossly intact.    Lab Results:  Lab Results  Component Value Date   WBC 9.5 07/20/2018   HGB 9.2 (L) 07/20/2018   HCT 30.5 (L) 07/20/2018   MCV 98.7 07/20/2018   PLT 302 07/20/2018   NEUTROABS 8.4 (H) 07/20/2018  Peripheral blood smear: RBC- moderate number of ovalocytes and teardrops, moderate variation red blood cell size, no increase in polychromasia; platelets-normal number of platelets; WBC- 1 myelocyte, several 5 lobed neutrophils, no blasts  Imaging:  No results found.  Medications: I have reviewed the patient's current medications.  Assessment/Plan: 1. 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; quantitative immunoglobulins show IgG mildly decreased 654 (range [phone]), IgA and IgM in normal range; polyclonal serum light  chain elevation  11/15/2015-hemoglobin 11.2  11/29/2015-hemoglobin 11.1  12/12/2005-hemoglobin 10.4; Aranesp 100 g every 2 weeks initiated  02/07/2016-hemoglobin 11.0, Aranesp held, changed to an every 3 week schedule  Progressive anemia and decreased iron stores December 2017, treated with IV iron January 2018 with improvement  She did not require Aranesp for 2 months and resumed Aranesp on a monthly schedule for 08/2016  Aranesp every 6 weeks beginning 05/04/2017  Progressive anemia and decreased iron stores 07/05/2017; IV irongiven2/11/2017 and 08/06/2017  Persistent severe anemia, Aranesp change to every 2 weeks beginning 10/19/2017  Persistent anemia, decreased iron stores 11/30/2017  IV iron 12/13/2017, 12/20/2017,  Decreased iron stores 05/17/2018   IV iron 06/24/2018  07/05/2018- no serum M spike, polyclonal serum light chain elevation, IgG mildly decreased with IgA and IgM within normal range  2. Chronic renal insufficiency  3. Atrial fibrillation-maintained on apixaban  4. Osteoarthritis  5. Diabetes  6. Sjogren's disease  7. Pericarditis February 2017  8.     Colonoscopy 01/07/2012-prior segmental colectomy in the sigmoid colon; moderate diverticulosis throughout the colon; otherwise normal examination  Disposition: Ms. Vazguez appears unchanged.  We reviewed the CBC from today.  The hemoglobin is better.  This is likely related to the IV iron she received 06/24/2018.  Plan to continue Aranesp every 2 weeks.  We will see her back in 1 month with a CBC and repeat iron studies.  Of note, she has not returned stool cards.  She will follow-up with orthopedics regarding the left hip/leg pain.  Plan reviewed with Dr. Benay Spice.    Ned Card ANP/GNP-BC   07/20/2018  3:40 PM

## 2018-07-20 NOTE — Telephone Encounter (Signed)
Printed avs and calender of upcoming appointment. Per 1/29 los

## 2018-07-21 ENCOUNTER — Other Ambulatory Visit (INDEPENDENT_AMBULATORY_CARE_PROVIDER_SITE_OTHER): Payer: Self-pay | Admitting: *Deleted

## 2018-07-21 ENCOUNTER — Telehealth (INDEPENDENT_AMBULATORY_CARE_PROVIDER_SITE_OTHER): Payer: Self-pay | Admitting: Orthopaedic Surgery

## 2018-07-21 DIAGNOSIS — G8929 Other chronic pain: Secondary | ICD-10-CM

## 2018-07-21 DIAGNOSIS — M5442 Lumbago with sciatica, left side: Principal | ICD-10-CM

## 2018-07-21 NOTE — Telephone Encounter (Signed)
Can we order CT since patient pacemaker?

## 2018-07-21 NOTE — Telephone Encounter (Signed)
Patient called stating she was referred to [facility] for an MRI, but was told they were not able to schedule because she has a pacemaker.  Patient requested a return call.

## 2018-07-21 NOTE — Telephone Encounter (Signed)
Patient called to let you know she will be going to [facility], as a walk in after lunch.

## 2018-07-21 NOTE — Telephone Encounter (Signed)
yes

## 2018-07-22 ENCOUNTER — Other Ambulatory Visit: Payer: Self-pay | Admitting: Nurse Practitioner

## 2018-07-22 ENCOUNTER — Ambulatory Visit
Admission: RE | Admit: 2018-07-22 | Discharge: 2018-07-22 | Disposition: A | Discharge status: Home / Self Care | Payer: Medicare Other | Source: Ambulatory Visit | Attending: Orthopaedic Surgery | Admitting: Orthopaedic Surgery

## 2018-07-22 DIAGNOSIS — N189 Chronic kidney disease, unspecified: Principal | ICD-10-CM

## 2018-07-22 DIAGNOSIS — M5442 Lumbago with sciatica, left side: Principal | ICD-10-CM

## 2018-07-22 DIAGNOSIS — G8929 Other chronic pain: Secondary | ICD-10-CM

## 2018-07-22 DIAGNOSIS — D631 Anemia in chronic kidney disease: Secondary | ICD-10-CM

## 2018-07-25 ENCOUNTER — Ambulatory Visit (INDEPENDENT_AMBULATORY_CARE_PROVIDER_SITE_OTHER): Payer: Self-pay

## 2018-07-25 ENCOUNTER — Ambulatory Visit (INDEPENDENT_AMBULATORY_CARE_PROVIDER_SITE_OTHER): Payer: Medicare Other | Admitting: Orthopaedic Surgery

## 2018-07-25 ENCOUNTER — Encounter (INDEPENDENT_AMBULATORY_CARE_PROVIDER_SITE_OTHER): Payer: Self-pay | Admitting: Orthopaedic Surgery

## 2018-07-25 ENCOUNTER — Other Ambulatory Visit: Payer: Medicare Other

## 2018-07-25 ENCOUNTER — Ambulatory Visit
Admission: RE | Admit: 2018-07-25 | Discharge: 2018-07-25 | Disposition: A | Discharge status: Home / Self Care | Payer: Medicare Other | Source: Ambulatory Visit | Attending: Orthopaedic Surgery | Admitting: Orthopaedic Surgery

## 2018-07-25 VITALS — BP 116/53 | HR 80 | Ht <= 58 in | Wt 245.0 lb

## 2018-07-25 DIAGNOSIS — M25552 Pain in left hip: Secondary | ICD-10-CM

## 2018-07-25 DIAGNOSIS — G8929 Other chronic pain: Secondary | ICD-10-CM

## 2018-07-25 DIAGNOSIS — M5441 Lumbago with sciatica, right side: Secondary | ICD-10-CM | POA: Diagnosis not present

## 2018-07-25 DIAGNOSIS — M5442 Lumbago with sciatica, left side: Secondary | ICD-10-CM

## 2018-07-25 NOTE — Progress Notes (Signed)
Office Visit Note   Patient: Tricia Gonzales           Date of Birth: 10-23-1940           MRN: [id number] Visit Date: 07/25/2018              Requested by: Carol Ada, MD [address], [place] [id number] PCP: Carol Ada, MD   Assessment & Plan: Visit Diagnoses:  1. Chronic bilateral low back pain with bilateral sciatica   2. Pain in left hip     Plan: Worsening pain in the area of her left lower extremity possibly localized to the left hip.  X-rays do show some change compared to films 3 weeks ago with possible avascular necrosis.  Minimal response to intra-articular cortisone injection left hip per Dr. Ernestina Patches.  Will order CT scan left hip.  We will also order epidural steroid injection lumbar spine with evidence of severe stenosis per CT scan.  Return to the office after the above.  Will order a wheelchair for her use and ask for home health to evaluate.  Office visit nearly 1 hour 50% of the time in counseling  Follow-Up Instructions: Return after CT scan of left hip.   Orders:  Orders Placed This Encounter  Procedures  . XR Pelvis 1-2 Views  . CT HIP LEFT WO CONTRAST   No orders of the defined types were placed in this encounter.     Procedures: No procedures performed   Clinical Data: No additional findings.   Subjective: No chief complaint on file. Tricia Gonzales has been followed for the problem referable to her back and left lower extremity to pathology based on her exam and asked Dr. Ernestina Patches to inject her hip.  This was performed recently without much permanent relief.  She did have some relief for about 2 days.  Prior films were negative for any obvious pathology about the left hip but by repeat films today there was considerable narrowing and possibly avascular necrosis.  I have ordered a CT scan.  He presently is in a wheelchair having ambulating.  No injury or trauma.  CT scan of the lumbar spine was performed last week demonstrating  severe foraminal narrowing at L5-S1.  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  I am not sure what is causing most of her pain i.e. pain from her lumbar spine or from her left hip.  She has been on gabapentin and Vicodin which have not helped.  She has been moving about with a wheelchair.  HPI  Review of Systems   Objective: Vital Signs: BP (!) 116/53 (BP Location: Right Arm, Patient Position: Sitting, Cuff Size: Normal)   Pulse 80   Ht _0  (1.473 m)   Wt 245 lb (111.1 kg)   BMI 51.21 kg/m   Physical Exam Constitutional:      Appearance: She is well-developed.  Eyes:     Pupils: Pupils are equal, round, and reactive to light.  Pulmonary:     Effort: Pulmonary effort is normal.  Skin:    General: Skin is warm and dry.  Neurological:     Mental Status: She is alert and oriented to person, place, and time.  Psychiatric:        Behavior: Behavior normal.     Ortho Exam evaluated in wheelchair as she was uncomfortable bearing any weight to the left lower extremity.  Films today did not demonstrate any acute change in the area  of her left hip but she did have some pain with internal/external rotation.  Very large legs BMI is 51.  Motor exam intact left lower extremity.  Straight leg raise negative.  No percussible tenderness of the lumbar spine. Specialty Comments:  No specialty comments available.  Imaging: Xr Pelvis 1-2 Views  Result Date: 07/25/2018 AP the pelvis was obtained and compared to the AP of the pelvis performed on January 13.  There appears to be a difference in appearance in the left hip.  I thought the joint space was well-maintained several weeks ago but on films today there is significant narrowing.  There may be some early avascular necrosis with collapse.  There is considerable bowel gas that obliterates a lot of the detail no change in the diffuse sclerosis of the sacroiliac joints no evidence of a fracture    PMFS History: Patient Active Problem List    Diagnosis Date Noted  . Vitamin B12 deficiency   . Type II diabetes mellitus ([place])   . Sjogren's disease ([place])   . Presence of permanent cardiac pacemaker   . Pericarditis   . Osteoarthritis   . Obesity   . Kidney stones   . Intestinal obstruction ([place])   . History of gout   . Hiatal hernia   . History of blood transfusion   . Heart murmur   . GERD (gastroesophageal reflux disease)   . Gastritis   . Esophageal stricture   . Early cataracts, bilateral   . Diverticulosis   . Depression   . Chronic kidney disease (CKD), stage III (moderate) (HCC)   . Chronic back pain   . Atrial fibrillation ([place])   . Arthritis   . Anxiety   . Anemia   . 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 Chest pain at rest   . Heme positive stool   . UTI (urinary tract infection) 08/09/2015  . Acute pericarditis   . Chest pain 08/06/2015  . Diabetes mellitus type 2, controlled ([place]) 08/06/2015  . Hyperlipidemia 08/06/2015  . Hypertension 08/06/2015  . CKD (chronic kidney disease) stage 3, GFR 30-59 ml/min (HCC) 08/06/2015  . Chronic anemia 08/06/2015  . Hyperkalemia   . Pain in the chest   . Pacemaker   . 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 Osteoporosis 03/22/2015  . Abdominal pain, left lower quadrant 01/07/2012  . Syncope 09/15/2011  . Junctional escape rhythm 09/15/2011  . PELVIC MASS 03/27/2009  . ABSCESS OF INTESTINE 02/19/2009  . VITAMIN B12 DEFICIENCY 11/21/2007  . OBESITY 11/21/2007  . ESOPHAGEAL STRICTURE 11/21/2007  . HIATAL HERNIA 11/21/2007  . UNSPECIFIED INTESTINAL OBSTRUCTION 11/21/2007  . DIVERTICULOSIS OF COLON 11/21/2007  . OSTEOARTHRITIS 11/21/2007  . DIABETES MELLITUS, HX OF 11/21/2007  . ANEMIA, HX OF 11/21/2007  . HYPERTENSION, HX OF 11/21/2007  . REFLUX ESOPHAGITIS, HX OF 11/21/2007   Past Medical History:  Diagnosis Date  . Anemia   . 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 Anxiety   . Arthritis   . Atrial fibrillation ([place])   . Chronic back  pain   . Chronic kidney disease (CKD), stage III (moderate) (HCC)   . Depression   . Diverticulosis   . Early cataracts, bilateral   . Esophageal stricture   . Gastritis   . GERD (gastroesophageal reflux disease)   . Heart murmur     " some doctors say that I have one some say that I don"t "  . Hiatal hernia   . History of blood  transfusion    "@ least w/1st knee OR"  . History of gout   . Hyperlipidemia   . Hypertension   . Intestinal obstruction ([place])   . Kidney stones   . Obesity   . Osteoarthritis   . Pericarditis   . Presence of permanent cardiac pacemaker   . Sjogren's disease ([place])   . Type II diabetes mellitus ([place])   . Vitamin B12 deficiency     Family History  Problem Relation Age of Onset  . Diabetes Mother   . Diabetes Brother   . Hypertension Sister   . Colon cancer Neg Hx   . Heart attack Neg Hx   . Stroke Neg Hx     Past Surgical History:  Procedure Laterality Date  . ABDOMINAL HYSTERECTOMY    . CHOLECYSTECTOMY OPEN    . COLECTOMY     for rectovaginal fistula  . COLONOSCOPY  01/07/2012   Procedure: COLONOSCOPY;  Surgeon: Lafayette Dragon, MD;  [facility];  Service: Endoscopy;  Laterality: N/A;  . EP IMPLANTABLE DEVICE N/A 06/18/2015   Procedure: Pacemaker Implant;  Surgeon: Evans Lance, MD;  Location: [facility];  Service: Cardiovascular;  Laterality: N/A;  . ESOPHAGOGASTRODUODENOSCOPY (EGD) WITH ESOPHAGEAL DILATION    . ESOPHAGOGASTRODUODENOSCOPY (EGD) WITH PROPOFOL N/A 08/12/2015   Procedure: ESOPHAGOGASTRODUODENOSCOPY (EGD) WITH PROPOFOL;  Surgeon: Manus Gunning, MD;  Location: [facility];  Service: Gastroenterology;  Laterality: N/A;  . I&D EXTREMITY Right 08/06/2016   Procedure: DEBRIDEMENT PIP RIGHT RING FINGER;  Surgeon: Daryll Brod, MD;  Location: [facility];  Service: Orthopedics;  Laterality: Right;  . INSERT / REPLACE / REMOVE PACEMAKER    . JOINT REPLACEMENT    . KNEE ARTHROSCOPY Right   . MASS EXCISION Right  08/06/2016   Procedure: EXCISION CYST;  Surgeon: Daryll Brod, MD;  Location: [facility];  Service: Orthopedics;  Laterality: Right;  . REVISION TOTAL KNEE ARTHROPLASTY Left   . TOTAL KNEE ARTHROPLASTY Bilateral    Social History   Occupational History  . Not on file  Tobacco Use  . Smoking status: Former Smoker    Packs/day: 2.00    Years: 3.00    Pack years: 6.00    Types: Cigarettes    Last attempt to quit: 06/23/1983    Years since quitting: 35.1  . Smokeless tobacco: Never Used  Substance and Sexual Activity  . Alcohol use: Yes    Alcohol/week: 2.0 standard drinks    Types: 2 Glasses of wine per week    Comment: occ  . Drug use: No  . Sexual activity: Never     Garald Balding, MD   Note - This record has been created using [employer].  Chart creation errors have been sought, but may not always  have been located. Such creation errors do not reflect on  the standard of medical care.

## 2018-07-26 ENCOUNTER — Other Ambulatory Visit: Payer: Self-pay | Admitting: *Deleted

## 2018-07-26 DIAGNOSIS — M25552 Pain in left hip: Secondary | ICD-10-CM

## 2018-07-27 ENCOUNTER — Telehealth: Payer: Self-pay | Admitting: *Deleted

## 2018-07-27 ENCOUNTER — Other Ambulatory Visit: Payer: Self-pay | Admitting: *Deleted

## 2018-07-27 DIAGNOSIS — M5442 Lumbago with sciatica, left side: Principal | ICD-10-CM

## 2018-07-27 DIAGNOSIS — G8929 Other chronic pain: Secondary | ICD-10-CM

## 2018-07-27 NOTE — Telephone Encounter (Signed)
[facility] Pre-operative Risk Assessment    Request for surgical clearance:  1. What type of surgery is being performed? LUMBAR EPIDURAL INJECTION   2. When is this surgery scheduled? TBD   3. What type of clearance is required (medical clearance vs. Pharmacy clearance to hold med vs. Both)? BOTH   4. Are there any medications that need to be held prior to surgery and how long?ELIQUIS X  2 DAYS PRIOR  5. Practice name and name of physician performing surgery?   [facility]   6. What is your office phone number [phone]    7.   What is your office fax number [phone]  8.   Anesthesia type (None, local, MAC, general) ? NONE LISTED    Julaine Hua 07/27/2018, 4:20 PM  _________________________________________________________________   (provider comments below)

## 2018-07-27 NOTE — Telephone Encounter (Signed)
Pt takes Eliquis for afib with CHADS2VASc score of 5 (age x2, sex, HTN, DM).   Pt is morbidly obese with SCr of 1.69. CrCl using actual body weight is 32m/min, 322mmin using adjusted body weight, and 2466min using ideal body weight.   Recommend holding Eliquis for 3 days prior to spinal injection per protocol.

## 2018-07-28 ENCOUNTER — Other Ambulatory Visit: Payer: Self-pay | Admitting: Internal Medicine

## 2018-07-29 NOTE — Telephone Encounter (Signed)
   Primary Cardiologist: No primary care provider on file.  Chart reviewed as part of pre-operative protocol coverage. Patient was contacted 07/29/2018 in reference to pre-operative risk assessment for pending surgery as outlined below.  Tricia Gonzales was last seen on 07/19/18 by Dr. Lovena Le.  Since that day, Tricia Gonzales has done well. There have been no changes in her medical history and no new symptoms since that clinic visit. She is at her baseline activity level.  Per our pharmacy: Pt takes Eliquis for afib with CHADS2VASc score of 5 (age x2, sex, HTN, DM).   Pt is morbidly obese with SCr of 1.69. CrCl using actual body weight is 25m/min, 376mmin using adjusted body weight, and 2428min using ideal body weight.   Recommend holding Eliquis for 3 days prior to spinal injection per protocol.  Therefore, based on ACC/AHA guidelines, the patient would be at acceptable risk for the planned procedure without further cardiovascular testing.   I will route this recommendation to the requesting party via Epic fax function and remove from pre-op pool.  Please call with questions.  AngMeridenA 07/29/2018, 2:20 PM

## 2018-08-01 ENCOUNTER — Telehealth (INDEPENDENT_AMBULATORY_CARE_PROVIDER_SITE_OTHER): Payer: Self-pay | Admitting: Orthopaedic Surgery

## 2018-08-01 ENCOUNTER — Telehealth: Payer: Self-pay | Admitting: *Deleted

## 2018-08-01 NOTE — Telephone Encounter (Signed)
Called to cancer her lab/injection for tomorrow due to her back pain. Having a procedure on 2/12 for her back and may reschedule afterwards if she is feeling better. She will call Thursday to reschedule. RN sent message to billing to ensure her insurance will still cover her getting the injection less than 14 days apart. If not, will need to reschedule her 2/25 appointments.

## 2018-08-01 NOTE — Telephone Encounter (Signed)
Patient called stating Dr. Durward Fortes sent a referral to [facility] for a wheelchair.  Patient states she received a call from her PCP Dr. Tamala Julian who received a fax from [place] (needing additional information) which should have been sent to Dr. Rudene Anda office.  Patient is requesting the office contact [place] to find out what information is missing.

## 2018-08-01 NOTE — Telephone Encounter (Signed)
Tricia Gonzales. The paperwork was sent to the patient's PCP. They are now going to fax over necessary paperwork to Dr.Whitfield so that patient can get her wheelchair.

## 2018-08-01 NOTE — Telephone Encounter (Signed)
Please call [facility]. Thank you.

## 2018-08-02 ENCOUNTER — Inpatient Hospital Stay: Payer: Medicare Other

## 2018-08-02 MED ORDER — DARBEPOETIN ALFA 200 MCG/0.4ML IJ SOSY
PREFILLED_SYRINGE | INTRAMUSCULAR | Status: AC
  Filled 2018-08-02: qty 0.4

## 2018-08-02 NOTE — Telephone Encounter (Signed)
Signed paperwork has been faxed to [place].

## 2018-08-03 ENCOUNTER — Ambulatory Visit
Admission: RE | Admit: 2018-08-03 | Discharge: 2018-08-03 | Disposition: A | Discharge status: Home / Self Care | Payer: Medicare Other | Source: Ambulatory Visit | Attending: Orthopaedic Surgery | Admitting: Orthopaedic Surgery

## 2018-08-03 DIAGNOSIS — M5442 Lumbago with sciatica, left side: Principal | ICD-10-CM

## 2018-08-03 DIAGNOSIS — G8929 Other chronic pain: Secondary | ICD-10-CM

## 2018-08-03 MED ORDER — IOPAMIDOL (ISOVUE-M 200) INJECTION 41%
1.0000 mL | Freq: Once | INTRAMUSCULAR | Status: AC
  Administered 2018-08-03: 1 mL via EPIDURAL

## 2018-08-03 MED ORDER — METHYLPREDNISOLONE ACETATE 40 MG/ML INJ SUSP (RADIOLOG
120.0000 mg | Freq: Once | INTRAMUSCULAR | Status: AC
  Administered 2018-08-03: 120 mg via EPIDURAL

## 2018-08-03 NOTE — Telephone Encounter (Signed)
Was made aware by [facility] in managed care that insurance requires Aranesp to be 14 days apart. She will need to reschedule the injection on 2/25 as well when she reschedules the missed injection of 08/02/18.

## 2018-08-03 NOTE — Discharge Instructions (Signed)

## 2018-08-08 ENCOUNTER — Telehealth (INDEPENDENT_AMBULATORY_CARE_PROVIDER_SITE_OTHER): Payer: Self-pay | Admitting: Orthopaedic Surgery

## 2018-08-08 NOTE — Telephone Encounter (Signed)
Patient left a voicemail requesting a return call from you directly.  Patient stated she had "a few things to discuss with you."

## 2018-08-09 ENCOUNTER — Other Ambulatory Visit (INDEPENDENT_AMBULATORY_CARE_PROVIDER_SITE_OTHER): Payer: Self-pay | Admitting: Orthopedic Surgery

## 2018-08-09 ENCOUNTER — Other Ambulatory Visit (INDEPENDENT_AMBULATORY_CARE_PROVIDER_SITE_OTHER): Payer: Self-pay | Admitting: Orthopaedic Surgery

## 2018-08-09 ENCOUNTER — Telehealth (INDEPENDENT_AMBULATORY_CARE_PROVIDER_SITE_OTHER): Payer: Self-pay | Admitting: Orthopaedic Surgery

## 2018-08-09 DIAGNOSIS — M25552 Pain in left hip: Secondary | ICD-10-CM

## 2018-08-09 MED ORDER — HYDROCODONE-ACETAMINOPHEN 5-325 MG PO TABS: 1.0000 | ORAL_TABLET | Freq: Four times a day (QID) | ORAL | 0 refills | Status: DC | PRN

## 2018-08-09 NOTE — Progress Notes (Signed)
Spoke to patient on phone and has been using hydrocodone for pain that she has from her medical doctor.  However it is too strong.  That being said we discussed other options including that of cutting the pill in quarters and taking it that way.  She would like to try that.  I therefore called in a prescription for her.  He has tolerated the hydrocodone of the fact that it is very sedating for her in the full tablet.

## 2018-08-09 NOTE — Telephone Encounter (Signed)
Please order home PT for patient. Thank you.

## 2018-08-09 NOTE — Telephone Encounter (Signed)
Please advise.

## 2018-08-09 NOTE — Telephone Encounter (Signed)
Referral entered  

## 2018-08-09 NOTE — Telephone Encounter (Signed)
Patient called stating she forgot to ask you yesterday if there is a pain medication that gives her more pain relief than Tramadol, but doesn't "knock her out" like Hydrocodone.  Patient requested a return call.

## 2018-08-09 NOTE — Telephone Encounter (Signed)
Spoke to her.  She would like to try some hydrocodone but she will cut this into quarter tablets and try it that way to see if it is beneficial.

## 2018-08-09 NOTE — Telephone Encounter (Signed)
Spoke with patient and let her know that referral has been sent to [facility] for home therapy.

## 2018-08-12 ENCOUNTER — Telehealth (INDEPENDENT_AMBULATORY_CARE_PROVIDER_SITE_OTHER): Payer: Self-pay | Admitting: Orthopaedic Surgery

## 2018-08-12 NOTE — Telephone Encounter (Signed)
Eric with [facility] at [facility] request verbal orders for PT 1wk/ 1, 2wk/3. Also, OT consult for bathing and dressing needs.

## 2018-08-15 NOTE — Telephone Encounter (Signed)
Please call

## 2018-08-15 NOTE — Telephone Encounter (Signed)
Please advise.

## 2018-08-15 NOTE — Telephone Encounter (Signed)
ok

## 2018-08-16 ENCOUNTER — Other Ambulatory Visit: Payer: Self-pay

## 2018-08-16 ENCOUNTER — Encounter: Payer: Self-pay | Admitting: Nurse Practitioner

## 2018-08-16 ENCOUNTER — Inpatient Hospital Stay: Payer: Medicare Other | Attending: Oncology

## 2018-08-16 ENCOUNTER — Inpatient Hospital Stay (HOSPITAL_BASED_OUTPATIENT_CLINIC_OR_DEPARTMENT_OTHER): Payer: Medicare Other | Admitting: Nurse Practitioner

## 2018-08-16 ENCOUNTER — Telehealth: Payer: Self-pay | Admitting: Nurse Practitioner

## 2018-08-16 ENCOUNTER — Inpatient Hospital Stay: Payer: Medicare Other

## 2018-08-16 VITALS — BP 147/68 | HR 75 | Temp 97.7°F | Resp 18 | Ht <= 58 in | Wt 236.2 lb

## 2018-08-16 DIAGNOSIS — N189 Chronic kidney disease, unspecified: Secondary | ICD-10-CM | POA: Insufficient documentation

## 2018-08-16 DIAGNOSIS — D631 Anemia in chronic kidney disease: Secondary | ICD-10-CM

## 2018-08-16 DIAGNOSIS — E611 Iron deficiency: Secondary | ICD-10-CM

## 2018-08-16 DIAGNOSIS — N183 Chronic kidney disease, stage 3 unspecified: Secondary | ICD-10-CM

## 2018-08-16 LAB — CBC WITH DIFFERENTIAL (CANCER CENTER ONLY)
Abs Immature Granulocytes: 0.06 10*3/uL (ref 0.00–0.07)
Basophils Absolute: 0 10*3/uL (ref 0.0–0.1)
Basophils Relative: 0 %
Eosinophils Absolute: 0 10*3/uL (ref 0.0–0.5)
Eosinophils Relative: 0 %
HCT: 23.9 % — ABNORMAL LOW (ref 36.0–46.0)
Hemoglobin: 7.1 g/dL — ABNORMAL LOW (ref 12.0–15.0)
Immature Granulocytes: 1 %
Lymphocytes Relative: 5 %
Lymphs Abs: 0.5 10*3/uL — ABNORMAL LOW (ref 0.7–4.0)
MCH: 29.7 pg (ref 26.0–34.0)
MCHC: 29.7 g/dL — ABNORMAL LOW (ref 30.0–36.0)
MCV: 100 fL (ref 80.0–100.0)
Monocytes Absolute: 0.5 10*3/uL (ref 0.1–1.0)
Monocytes Relative: 5 %
Neutro Abs: 8.5 10*3/uL — ABNORMAL HIGH (ref 1.7–7.7)
Neutrophils Relative %: 89 %
Platelet Count: 355 10*3/uL (ref 150–400)
RBC: 2.39 MIL/uL — ABNORMAL LOW (ref 3.87–5.11)
RDW: 15.5 % (ref 11.5–15.5)
WBC Count: 9.7 10*3/uL (ref 4.0–10.5)
nRBC: 0 % (ref 0.0–0.2)

## 2018-08-16 LAB — IRON AND TIBC
Iron: 48 ug/dL (ref 41–142)
Saturation Ratios: 14 % — ABNORMAL LOW (ref 21–57)
TIBC: 336 ug/dL (ref 236–444)
UIBC: 288 ug/dL (ref 120–384)

## 2018-08-16 LAB — VITAMIN B12: Vitamin B-12: 387 pg/mL (ref 180–914)

## 2018-08-16 LAB — FERRITIN: Ferritin: 27 ng/mL (ref 11–307)

## 2018-08-16 MED ORDER — DARBEPOETIN ALFA 200 MCG/0.4ML IJ SOSY
200.0000 ug | PREFILLED_SYRINGE | Freq: Once | INTRAMUSCULAR | Status: AC
  Administered 2018-08-16: 200 ug via SUBCUTANEOUS

## 2018-08-16 MED ORDER — DARBEPOETIN ALFA 200 MCG/0.4ML IJ SOSY
PREFILLED_SYRINGE | INTRAMUSCULAR | Status: AC
  Filled 2018-08-16: qty 0.4

## 2018-08-16 NOTE — Telephone Encounter (Signed)
Scheduled appt per 02/25 los.  Printed calendar and avs.

## 2018-08-16 NOTE — Progress Notes (Addendum)
[facility] OFFICE PROGRESS NOTE   Diagnosis: Anemia, renal insufficiency  INTERVAL HISTORY:   Tricia Gonzales returns as scheduled.  Last Aranesp injection 07/20/2018.  She was unable to keep the appointment 2 weeks ago for an injection.  She feels she needs a blood transfusion.  She is weak and lightheaded.  She has dyspnea on exertion.  She denies any bleeding.  She continues to have left hip pain.  Objective:  Vital signs in last 24 hours:  Blood pressure (!) 147/68, pulse 75, temperature 97.7 F (36.5 C), temperature source Oral, resp. rate 18, height _0  (1.473 m), weight 236 lb 3.2 oz (107.1 kg), SpO2 95 %.   Resp: Lungs clear bilaterally. Cardio: Irregular. GI: No hepatomegaly. Vascular: Trace edema at the lower legs bilaterally. Skin: Pale appearing.   Lab Results:  Lab Results  Component Value Date   WBC 9.7 08/16/2018   HGB 7.1 (L) 08/16/2018   HCT 23.9 (L) 08/16/2018   MCV 100.0 08/16/2018   PLT 355 08/16/2018   NEUTROABS 8.5 (H) 08/16/2018    Imaging:  No results found.  Medications: I have reviewed the patient's current medications.  Assessment/Plan: 1. 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; quantitative immunoglobulins show IgG mildly decreased 654 (range [phone]), IgA and IgM in normal range; polyclonal serum light chain elevation  11/15/2015-hemoglobin 11.2  11/29/2015-hemoglobin 11.1  12/12/2005-hemoglobin 10.4; Aranesp 100 g every 2 weeks initiated  02/07/2016-hemoglobin 11.0, Aranesp held, changed  to an every 3 week schedule  Progressive anemia and decreased iron stores December 2017, treated with IV iron January 2018 with improvement  She did not require Aranesp for 2 months and resumed Aranesp on a monthly schedule for 08/2016  Aranesp every 6 weeks beginning 05/04/2017  Progressive anemia and decreased iron stores 07/05/2017; IV irongiven2/11/2017 and 08/06/2017  Persistent severe anemia, Aranesp change to every 2 weeks beginning 10/19/2017  Persistent anemia, decreased iron stores 11/30/2017  IV iron 12/13/2017, 12/20/2017,  Decreased iron stores 05/17/2018  IV iron 06/24/2018  07/05/2018- no serum M spike, polyclonal serum light chain elevation, IgG mildly decreased with IgA and IgM within normal range  2. Chronic renal insufficiency  3. Atrial fibrillation-maintained on apixaban  4. Osteoarthritis  5. Diabetes  6. Sjogren's disease  7. Pericarditis February 2017  8.     Colonoscopy 01/07/2012-prior segmental colectomy in the sigmoid colon; moderate diverticulosis throughout the colon; otherwise normal examination   Disposition: Tricia Gonzales has progressive symptomatic anemia.  We are arranging for a blood transfusion later this week.  She will continue lab and Aranesp every 2 weeks.  We will follow-up on the iron studies from today.  She likely needs IV iron as well.  She has not yet returned the stool cards.  She understands we are concerned she may be losing blood through the GI tract.  She will try to complete the stool cards.  She will return for a follow-up visit in 2 weeks.  She will contact the office in the interim with any problems.  Patient seen with Dr. Benay Spice.  Ned Card ANP/GNP-BC   08/16/2018  12:52 PM  This was a shared visit with Ned Card.  Tricia Gonzales has progressive anemia.  The anemia is secondary to renal insufficiency, chronic disease, and she appears to have iron deficiency.  We will arrange for red cell  transfusion and IV iron.  She will resume erythropoietin therapy.  We encouraged her to return stool Hemoccult cards.  A laboratory work-up last month did not suggest myeloma.  Julieanne Manson, MD

## 2018-08-16 NOTE — Telephone Encounter (Signed)
LMOM w/Eric from [facility]

## 2018-08-17 ENCOUNTER — Inpatient Hospital Stay: Payer: Medicare Other

## 2018-08-17 VITALS — BP 142/58 | HR 72 | Temp 98.0°F | Resp 18

## 2018-08-17 DIAGNOSIS — N183 Chronic kidney disease, stage 3 unspecified: Secondary | ICD-10-CM

## 2018-08-17 DIAGNOSIS — N189 Chronic kidney disease, unspecified: Secondary | ICD-10-CM | POA: Diagnosis not present

## 2018-08-17 DIAGNOSIS — D631 Anemia in chronic kidney disease: Secondary | ICD-10-CM

## 2018-08-17 LAB — SAMPLE TO BLOOD BANK

## 2018-08-17 LAB — PREPARE RBC (CROSSMATCH)

## 2018-08-17 MED ORDER — SODIUM CHLORIDE 0.9% IV SOLUTION
250.0000 mL | Freq: Once | INTRAVENOUS | Status: AC
  Administered 2018-08-17: 250 mL via INTRAVENOUS
  Filled 2018-08-17: qty 250

## 2018-08-17 MED ORDER — SODIUM CHLORIDE 0.9 % IV SOLN
510.0000 mg | Freq: Once | INTRAVENOUS | Status: AC
  Administered 2018-08-17: 510 mg via INTRAVENOUS
  Filled 2018-08-17: qty 17

## 2018-08-17 MED ORDER — SODIUM CHLORIDE 0.9 % IV SOLN
Freq: Once | INTRAVENOUS | Status: AC
  Administered 2018-08-17: 17:00:00 via INTRAVENOUS
  Filled 2018-08-17: qty 250

## 2018-08-17 NOTE — Patient Instructions (Signed)
Ferumoxytol injection What is this medicine? FERUMOXYTOL is an iron complex. Iron is used to make healthy red blood cells, which carry oxygen and nutrients throughout the body. This medicine is used to treat iron deficiency anemia. This medicine may be used for other purposes; ask your health care provider or pharmacist if you have questions. COMMON BRAND NAME(S): Feraheme What should I tell my health care provider before I take this medicine? They need to know if you have any of these conditions: -anemia not caused by low iron levels -high levels of iron in the blood -magnetic resonance imaging (MRI) test scheduled -an unusual or allergic reaction to iron, other medicines, foods, dyes, or preservatives -pregnant or trying to get pregnant -breast-feeding How should I use this medicine? This medicine is for injection into a vein. It is given by a health care professional in a hospital or clinic setting. Talk to your pediatrician regarding the use of this medicine in children. Special care may be needed. Overdosage: If you think you have taken too much of this medicine contact a poison control center or emergency room at once. NOTE: This medicine is only for you. Do not share this medicine with others. What if I miss a dose? It is important not to miss your dose. Call your doctor or health care professional if you are unable to keep an appointment. What may interact with this medicine? This medicine may interact with the following medications: -other iron products This list may not describe all possible interactions. Give your health care provider a list of all the medicines, herbs, non-prescription drugs, or dietary supplements you use. Also tell them if you smoke, drink alcohol, or use illegal drugs. Some items may interact with your medicine. What should I watch for while using this medicine? Visit your doctor or healthcare professional regularly. Tell your doctor or healthcare professional  if your symptoms do not start to get better or if they get worse. You may need blood work done while you are taking this medicine. You may need to follow a special diet. Talk to your doctor. Foods that contain iron include: whole grains/cereals, dried fruits, beans, or peas, leafy green vegetables, and organ meats (liver, kidney). What side effects may I notice from receiving this medicine? Side effects that you should report to your doctor or health care professional as soon as possible: -allergic reactions like skin rash, itching or hives, swelling of the face, lips, or tongue -breathing problems -changes in blood pressure -feeling faint or lightheaded, falls -fever or chills -flushing, sweating, or hot feelings -swelling of the ankles or feet Side effects that usually do not require medical attention (report to your doctor or health care professional if they continue or are bothersome): -diarrhea -headache -nausea, vomiting -stomach pain This list may not describe all possible side effects. Call your doctor for medical advice about side effects. You may report side effects to FDA at 1-800-FDA-1088. Where should I keep my medicine? This drug is given in a hospital or clinic and will not be stored at home. NOTE: This sheet is a summary. It may not cover all possible information. If you have questions about this medicine, talk to your doctor, pharmacist, or health care provider.  2019 Elsevier/Gold Standard (2016-07-27 20:21:10)   Blood Transfusion, Adult, Care After This sheet gives you information about how to care for yourself after your procedure. Your doctor may also give you more specific instructions. If you have problems or questions, contact your doctor. Follow these instructions  at home:   Take over-the-counter and prescription medicines only as told by your doctor.  Go back to your normal activities as told by your doctor.  Follow instructions from your doctor about how to  take care of the area where an IV tube was put into your vein (insertion site). Make sure you: ? Wash your hands with soap and water before you change your bandage (dressing). If there is no soap and water, use hand sanitizer. ? Change your bandage as told by your doctor.  Check your IV insertion site every day for signs of infection. Check for: ? More redness, swelling, or pain. ? More fluid or blood. ? Warmth. ? Pus or a bad smell. Contact a doctor if:  You have more redness, swelling, or pain around the IV insertion site.  You have more fluid or blood coming from the IV insertion site.  Your IV insertion site feels warm to the touch.  You have pus or a bad smell coming from the IV insertion site.  Your pee (urine) turns pink, red, or brown.  You feel weak after doing your normal activities. Get help right away if:  You have signs of a serious allergic or body defense (immune) system reaction, including: ? Itchiness. ? Hives. ? Trouble breathing. ? Anxiety. ? Pain in your chest or lower back. ? Fever, flushing, and chills. ? Fast pulse. ? Rash. ? Watery poop (diarrhea). ? Throwing up (vomiting). ? Dark pee. ? Serious headache. ? Dizziness. ? Stiff neck. ? Yellow color in your face or the white parts of your eyes (jaundice). Summary  After a blood transfusion, return to your normal activities as told by your doctor.  Every day, check for signs of infection where the IV tube was put into your vein.  Some signs of infection are warm skin, more redness and pain, more fluid or blood, and pus or a bad smell where the needle went in.  Contact your doctor if you feel weak or have any unusual symptoms. This information is not intended to replace advice given to you by your health care provider. Make sure you discuss any questions you have with your health care provider. Document Released: 06/29/2014 Document Revised: 01/31/2016 Document Reviewed: 01/31/2016 Elsevier  Interactive Patient Education  [employer].

## 2018-08-17 NOTE — Progress Notes (Signed)
Patient refused to stay for 30 minute post iron observation period. Patient stated she has received iron before and does not stay because she hasn't had any issues. Explained to patient the reasoning behind the 30 minute post observation period but she declined to stay.

## 2018-08-18 ENCOUNTER — Ambulatory Visit (INDEPENDENT_AMBULATORY_CARE_PROVIDER_SITE_OTHER): Payer: Medicare Other | Admitting: Orthopaedic Surgery

## 2018-08-18 ENCOUNTER — Encounter (HOSPITAL_COMMUNITY): Payer: Medicare Other

## 2018-08-18 LAB — CUP PACEART INCLINIC DEVICE CHECK
Battery Remaining Longevity: 85 mo
Battery Voltage: 3.01 V
Brady Statistic AP VP Percent: 65.32 %
Brady Statistic AP VS Percent: 2.61 %
Brady Statistic AS VP Percent: 18.13 %
Brady Statistic AS VS Percent: 13.95 %
Brady Statistic RA Percent Paced: 67.81 %
Brady Statistic RV Percent Paced: 83.34 %
Date Time Interrogation Session: 20200128214021
Implantable Lead Implant Date: 20161227
Implantable Lead Implant Date: 20161227
Implantable Lead Location: 753859
Implantable Lead Location: 753860
Implantable Lead Model: 5076
Implantable Lead Model: 5076
Implantable Pulse Generator Implant Date: 20161227
Lead Channel Impedance Value: 342 Ohm
Lead Channel Impedance Value: 361 Ohm
Lead Channel Impedance Value: 380 Ohm
Lead Channel Impedance Value: 380 Ohm
Lead Channel Pacing Threshold Amplitude: 0.625 V
Lead Channel Pacing Threshold Amplitude: 1.125 V
Lead Channel Pacing Threshold Pulse Width: 0.4 ms
Lead Channel Pacing Threshold Pulse Width: 0.4 ms
Lead Channel Sensing Intrinsic Amplitude: 15.375 mV
Lead Channel Sensing Intrinsic Amplitude: 16 mV
Lead Channel Sensing Intrinsic Amplitude: 2.5 mV
Lead Channel Sensing Intrinsic Amplitude: 2.75 mV
Lead Channel Setting Pacing Amplitude: 2 V
Lead Channel Setting Pacing Amplitude: 2.5 V
Lead Channel Setting Pacing Pulse Width: 0.4 ms
Lead Channel Setting Sensing Sensitivity: 2.8 mV

## 2018-08-18 LAB — TYPE AND SCREEN
ABO/RH(D): A POS
Antibody Screen: NEGATIVE
Unit division: 0
Unit division: 0

## 2018-08-18 LAB — BPAM RBC
Blood Product Expiration Date: 202003152359
Blood Product Expiration Date: 202003152359
ISSUE DATE / TIME: 202002261244
ISSUE DATE / TIME: 202002261244
Unit Type and Rh: 6200
Unit Type and Rh: 6200

## 2018-08-22 ENCOUNTER — Ambulatory Visit (INDEPENDENT_AMBULATORY_CARE_PROVIDER_SITE_OTHER): Payer: Medicare Other | Admitting: Orthopaedic Surgery

## 2018-08-22 VITALS — Ht <= 58 in | Wt 236.0 lb

## 2018-08-22 DIAGNOSIS — M87052 Idiopathic aseptic necrosis of left femur: Secondary | ICD-10-CM | POA: Insufficient documentation

## 2018-08-22 NOTE — Progress Notes (Signed)
The patient is still not seeing for the first time.  She sent from Dr. Durward Fortes section to evaluate her for hip placement surgery.  However BMI is 49 and she is diabetic.  She reports good control of her diabetes.  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  She is mainly in a wheelchair.  Her pain is daily and is detrimentally factor mobility her quality of life and her activities daily living.  She cannot even get up on the exam table for me to be able to try to examine to see if I can even get to her hip.  When I did examine her hip her range of motion is severely limited secondary to pain her pain is severe in the groin.  Her abdomen hangs low over her anterior hip area and her thigh is quite large even anteriorly.  I am certainly at this point not comfortable with proceeding with any type of hip replacement surgery on her through the front due to her extensive soft tissue in this area and the high risk of complications.  She is very disappointed in hearing that and is very disappointed me in trying to explain to her the safety and intricate aspects of surgery to be able to safely get to her hip.  Certainly she is had in the right direction having lost 30 pounds but she needs to lose more so I can mobilize her soft tissue and get to her hip.  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  All these factor into her potentially having a bad outcome from surgery however understand the need for surgery due to the severity of her pain and having reviewed the CT scan showing the aspects of her avascular necrosis that is rapidly worsening.  She is also had a steroid injection in her left hip which was intra-articular.  I would not recommend any of these a anymore.  Certainly therapy can help with her balance and coordination.  All question concerns were answered and  addressed.  I would like to see her back in 3 months to see how she is doing overall and mainly to get her on exam table and see back in later supine to see if I can safely get to her hip.

## 2018-08-24 ENCOUNTER — Other Ambulatory Visit: Payer: Self-pay | Admitting: Nurse Practitioner

## 2018-08-24 ENCOUNTER — Inpatient Hospital Stay: Payer: Medicare Other | Admitting: Nutrition

## 2018-08-24 ENCOUNTER — Inpatient Hospital Stay: Payer: Medicare Other | Attending: Oncology

## 2018-08-24 VITALS — BP 119/53 | HR 78 | Temp 98.2°F | Resp 20

## 2018-08-24 DIAGNOSIS — E1122 Type 2 diabetes mellitus with diabetic chronic kidney disease: Secondary | ICD-10-CM | POA: Diagnosis not present

## 2018-08-24 DIAGNOSIS — N183 Chronic kidney disease, stage 3 unspecified: Secondary | ICD-10-CM

## 2018-08-24 DIAGNOSIS — D631 Anemia in chronic kidney disease: Secondary | ICD-10-CM | POA: Diagnosis present

## 2018-08-24 DIAGNOSIS — E611 Iron deficiency: Secondary | ICD-10-CM | POA: Insufficient documentation

## 2018-08-24 DIAGNOSIS — N189 Chronic kidney disease, unspecified: Secondary | ICD-10-CM

## 2018-08-24 DIAGNOSIS — Z7901 Long term (current) use of anticoagulants: Secondary | ICD-10-CM | POA: Diagnosis not present

## 2018-08-24 DIAGNOSIS — I482 Chronic atrial fibrillation, unspecified: Secondary | ICD-10-CM | POA: Diagnosis not present

## 2018-08-24 MED ORDER — SODIUM CHLORIDE 0.9 % IV SOLN
Freq: Once | INTRAVENOUS | Status: AC
  Administered 2018-08-24: 15:00:00 via INTRAVENOUS
  Filled 2018-08-24: qty 250

## 2018-08-24 MED ORDER — SODIUM CHLORIDE 0.9 % IV SOLN
510.0000 mg | Freq: Once | INTRAVENOUS | Status: AC
  Administered 2018-08-24: 510 mg via INTRAVENOUS
  Filled 2018-08-24: qty 17

## 2018-08-24 MED ORDER — SODIUM CHLORIDE 0.9% FLUSH
10.0000 mL | Freq: Once | INTRAVENOUS | Status: DC | PRN
  Filled 2018-08-24: qty 10

## 2018-08-24 MED ORDER — HEPARIN SOD (PORK) LOCK FLUSH 100 UNIT/ML IV SOLN
500.0000 [IU] | Freq: Once | INTRAVENOUS | Status: DC | PRN
  Filled 2018-08-24: qty 5

## 2018-08-24 NOTE — Patient Instructions (Signed)
Ferumoxytol injection What is this medicine? FERUMOXYTOL is an iron complex. Iron is used to make healthy red blood cells, which carry oxygen and nutrients throughout the body. This medicine is used to treat iron deficiency anemia. This medicine may be used for other purposes; ask your health care provider or pharmacist if you have questions. COMMON BRAND NAME(S): Feraheme What should I tell my health care provider before I take this medicine? They need to know if you have any of these conditions: -anemia not caused by low iron levels -high levels of iron in the blood -magnetic resonance imaging (MRI) test scheduled -an unusual or allergic reaction to iron, other medicines, foods, dyes, or preservatives -pregnant or trying to get pregnant -breast-feeding How should I use this medicine? This medicine is for injection into a vein. It is given by a health care professional in a hospital or clinic setting. Talk to your pediatrician regarding the use of this medicine in children. Special care may be needed. Overdosage: If you think you have taken too much of this medicine contact a poison control center or emergency room at once. NOTE: This medicine is only for you. Do not share this medicine with others. What if I miss a dose? It is important not to miss your dose. Call your doctor or health care professional if you are unable to keep an appointment. What may interact with this medicine? This medicine may interact with the following medications: -other iron products This list may not describe all possible interactions. Give your health care provider a list of all the medicines, herbs, non-prescription drugs, or dietary supplements you use. Also tell them if you smoke, drink alcohol, or use illegal drugs. Some items may interact with your medicine. What should I watch for while using this medicine? Visit your doctor or healthcare professional regularly. Tell your doctor or healthcare professional  if your symptoms do not start to get better or if they get worse. You may need blood work done while you are taking this medicine. You may need to follow a special diet. Talk to your doctor. Foods that contain iron include: whole grains/cereals, dried fruits, beans, or peas, leafy green vegetables, and organ meats (liver, kidney). What side effects may I notice from receiving this medicine? Side effects that you should report to your doctor or health care professional as soon as possible: -allergic reactions like skin rash, itching or hives, swelling of the face, lips, or tongue -breathing problems -changes in blood pressure -feeling faint or lightheaded, falls -fever or chills -flushing, sweating, or hot feelings -swelling of the ankles or feet Side effects that usually do not require medical attention (report to your doctor or health care professional if they continue or are bothersome): -diarrhea -headache -nausea, vomiting -stomach pain This list may not describe all possible side effects. Call your doctor for medical advice about side effects. You may report side effects to FDA at 1-800-FDA-1088. Where should I keep my medicine? This drug is given in a hospital or clinic and will not be stored at home. NOTE: This sheet is a summary. It may not cover all possible information. If you have questions about this medicine, talk to your doctor, pharmacist, or health care provider.  2019 Elsevier/Gold Standard (2016-07-27 20:21:10)   Blood Transfusion, Adult, Care After This sheet gives you information about how to care for yourself after your procedure. Your doctor may also give you more specific instructions. If you have problems or questions, contact your doctor. Follow these instructions   at home:   Take over-the-counter and prescription medicines only as told by your doctor.  Go back to your normal activities as told by your doctor.  Follow instructions from your doctor about how to  take care of the area where an IV tube was put into your vein (insertion site). Make sure you: ? Wash your hands with soap and water before you change your bandage (dressing). If there is no soap and water, use hand sanitizer. ? Change your bandage as told by your doctor.  Check your IV insertion site every day for signs of infection. Check for: ? More redness, swelling, or pain. ? More fluid or blood. ? Warmth. ? Pus or a bad smell. Contact a doctor if:  You have more redness, swelling, or pain around the IV insertion site.  You have more fluid or blood coming from the IV insertion site.  Your IV insertion site feels warm to the touch.  You have pus or a bad smell coming from the IV insertion site.  Your pee (urine) turns pink, red, or brown.  You feel weak after doing your normal activities. Get help right away if:  You have signs of a serious allergic or body defense (immune) system reaction, including: ? Itchiness. ? Hives. ? Trouble breathing. ? Anxiety. ? Pain in your chest or lower back. ? Fever, flushing, and chills. ? Fast pulse. ? Rash. ? Watery poop (diarrhea). ? Throwing up (vomiting). ? Dark pee. ? Serious headache. ? Dizziness. ? Stiff neck. ? Yellow color in your face or the white parts of your eyes (jaundice). Summary  After a blood transfusion, return to your normal activities as told by your doctor.  Every day, check for signs of infection where the IV tube was put into your vein.  Some signs of infection are warm skin, more redness and pain, more fluid or blood, and pus or a bad smell where the needle went in.  Contact your doctor if you feel weak or have any unusual symptoms. This information is not intended to replace advice given to you by your health care provider. Make sure you discuss any questions you have with your health care provider. Document Released: 06/29/2014 Document Revised: 01/31/2016 Document Reviewed: 01/31/2016 Elsevier  Interactive Patient Education  [employer].

## 2018-08-30 ENCOUNTER — Inpatient Hospital Stay (HOSPITAL_BASED_OUTPATIENT_CLINIC_OR_DEPARTMENT_OTHER): Payer: Medicare Other | Admitting: Nurse Practitioner

## 2018-08-30 ENCOUNTER — Inpatient Hospital Stay: Payer: Medicare Other

## 2018-08-30 ENCOUNTER — Encounter: Payer: Self-pay | Admitting: Nurse Practitioner

## 2018-08-30 ENCOUNTER — Ambulatory Visit: Payer: Medicare Other

## 2018-08-30 VITALS — BP 133/71 | HR 75 | Temp 97.8°F | Resp 17 | Ht <= 58 in | Wt 233.9 lb

## 2018-08-30 DIAGNOSIS — N183 Chronic kidney disease, stage 3 unspecified: Secondary | ICD-10-CM

## 2018-08-30 DIAGNOSIS — N189 Chronic kidney disease, unspecified: Secondary | ICD-10-CM

## 2018-08-30 DIAGNOSIS — D631 Anemia in chronic kidney disease: Secondary | ICD-10-CM | POA: Diagnosis not present

## 2018-08-30 DIAGNOSIS — E1122 Type 2 diabetes mellitus with diabetic chronic kidney disease: Secondary | ICD-10-CM

## 2018-08-30 DIAGNOSIS — K921 Melena: Secondary | ICD-10-CM

## 2018-08-30 DIAGNOSIS — D649 Anemia, unspecified: Secondary | ICD-10-CM

## 2018-08-30 LAB — CBC WITH DIFFERENTIAL (CANCER CENTER ONLY)
Abs Immature Granulocytes: 0.09 10*3/uL — ABNORMAL HIGH (ref 0.00–0.07)
Basophils Absolute: 0 10*3/uL (ref 0.0–0.1)
Basophils Relative: 0 %
Eosinophils Absolute: 0 10*3/uL (ref 0.0–0.5)
Eosinophils Relative: 0 %
HCT: 26.9 % — ABNORMAL LOW (ref 36.0–46.0)
Hemoglobin: 8 g/dL — ABNORMAL LOW (ref 12.0–15.0)
Immature Granulocytes: 1 %
Lymphocytes Relative: 4 %
Lymphs Abs: 0.5 10*3/uL — ABNORMAL LOW (ref 0.7–4.0)
MCH: 30.4 pg (ref 26.0–34.0)
MCHC: 29.7 g/dL — ABNORMAL LOW (ref 30.0–36.0)
MCV: 102.3 fL — ABNORMAL HIGH (ref 80.0–100.0)
Monocytes Absolute: 0.5 10*3/uL (ref 0.1–1.0)
Monocytes Relative: 4 %
Neutro Abs: 10.3 10*3/uL — ABNORMAL HIGH (ref 1.7–7.7)
Neutrophils Relative %: 91 %
Platelet Count: 326 10*3/uL (ref 150–400)
RBC: 2.63 MIL/uL — ABNORMAL LOW (ref 3.87–5.11)
RDW: 17.3 % — ABNORMAL HIGH (ref 11.5–15.5)
WBC Count: 11.4 10*3/uL — ABNORMAL HIGH (ref 4.0–10.5)
nRBC: 0 % (ref 0.0–0.2)

## 2018-08-30 MED ORDER — DARBEPOETIN ALFA 200 MCG/0.4ML IJ SOSY
PREFILLED_SYRINGE | INTRAMUSCULAR | Status: AC
  Filled 2018-08-30: qty 0.4

## 2018-08-30 MED ORDER — DARBEPOETIN ALFA 200 MCG/0.4ML IJ SOSY
200.0000 ug | PREFILLED_SYRINGE | Freq: Once | INTRAMUSCULAR | Status: AC
  Administered 2018-08-30: 200 ug via SUBCUTANEOUS

## 2018-08-30 NOTE — Patient Instructions (Signed)

## 2018-08-30 NOTE — Progress Notes (Addendum)
[place] OFFICE PROGRESS NOTE   Diagnosis:  Anemia, renal insufficiency  INTERVAL HISTORY:   Tricia Gonzales returns as scheduled.  She was transfused 2 units of blood on 08/17/2018.  She received Feraheme on 08/17/2018 and 08/24/2018.  She felt better for a brief time after the blood transfusion.  She has been unable to complete the stool cards.  Appetite is poor.  She continues to lose weight.  She denies bleeding.  Dyspnea varies.  She has had diarrhea in the past.  Most recently, over the past several months she has had constipation.  Intermittent nausea.  No dysphagia.  Objective:  Vital signs in last 24 hours:  Blood pressure 133/71, pulse 75, temperature 97.8 F (36.6 C), temperature source Oral, resp. rate 17, height _0  (1.473 m), weight 233 lb 14.4 oz (106.1 kg), SpO2 97 %.    HEENT: Conjunctival pallor. Resp: Lungs clear bilaterally. Cardio: Regular rate and rhythm. GI: Abdomen soft and nontender.  No hepatomegaly.  No rectal mass.  Stool significantly heme positive. Vascular: No leg edema.    Lab Results:  Lab Results  Component Value Date   WBC 11.4 (H) 08/30/2018   HGB 8.0 (L) 08/30/2018   HCT 26.9 (L) 08/30/2018   MCV 102.3 (H) 08/30/2018   PLT 326 08/30/2018   NEUTROABS 10.3 (H) 08/30/2018    Imaging:  No results found.  Medications: I have reviewed the patient's current medications.  Assessment/Plan: 1. 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; quantitative immunoglobulins show IgG mildly decreased 654 (range  [phone]), IgA and IgM in normal range; polyclonal serum light chain elevation  11/15/2015-hemoglobin 11.2  11/29/2015-hemoglobin 11.1  12/12/2005-hemoglobin 10.4; Aranesp 100 g every 2 weeks initiated  02/07/2016-hemoglobin 11.0, Aranesp held, changed to an every 3 week schedule  Progressive anemia and decreased iron stores December 2017, treated with IV iron January 2018 with improvement  She did not require Aranesp for 2 months and resumed Aranesp on a monthly schedule for 08/2016  Aranesp every 6 weeks beginning 05/04/2017  Progressive anemia and decreased iron stores 07/05/2017; IV irongiven2/11/2017 and 08/06/2017  Persistent severe anemia, Aranesp change to every 2 weeks beginning 10/19/2017  Persistent anemia, decreased iron stores 11/30/2017  IV iron 12/13/2017, 12/20/2017,  Decreased iron stores 05/17/2018  IV iron1/08/2018  07/05/2018- no serum M spike, polyclonal serum light chain elevation, IgG mildly decreased with IgA and IgM within normal range   2 units of blood 08/17/2018  IV iron 08/17/2018 and 08/24/2018  Stool heme +08/30/2018  2. Chronic renal insufficiency  3. Atrial fibrillation-maintained on apixaban  4. Osteoarthritis  5. Diabetes  6. Sjogren's disease  7. Pericarditis February 2017  8.Colonoscopy 01/07/2012-prior segmental colectomy in the sigmoid colon; moderate diverticulosis throughout the colon; otherwise normal examination   Disposition: Ms. Donofrio has persistent/progressive anemia.  She has been unable to complete outpatient stool cards.  Stool diffusely heme positive when tested today.  We made an urgent referral to [name] for GI evaluation.  She will return for a blood transfusion 09/01/2018.  She will return for lab and follow-up in approximately 2 weeks.  Patient seen with Dr. Benay Spice.    Ned Card ANP/GNP-BC  08/30/2018  4:14 PM This was a shared visit with Ned Card.  Ms. Mckiver has  persistent severe anemia.  The stool is Hemoccult positive.  We will refer her to [name] for a GI evaluation.  We will arrange for a red cell transfusion.  Julieanne Manson, MD

## 2018-08-31 ENCOUNTER — Telehealth: Payer: Self-pay

## 2018-08-31 ENCOUNTER — Telehealth (INDEPENDENT_AMBULATORY_CARE_PROVIDER_SITE_OTHER): Payer: Self-pay | Admitting: Orthopaedic Surgery

## 2018-08-31 NOTE — Telephone Encounter (Signed)
Cindee Salt, PT with [facility] left a voicemail requesting additional physical therapy for patient.  Cindee Salt states if they receive verbal orders before 1:00 today they will be able to schedule PT for today.  He is requesting 2 times/week for 4 weeks.   Please call his cell [phone]

## 2018-08-31 NOTE — Telephone Encounter (Signed)
Called Tricia Gonzales to get her scheduled for her blood. She cannot come until 1015.  Scheduled her for lab at 1015 and then infusion at 1030.  She understands and will be here then with her caregiver.  Gardiner Rhyme

## 2018-09-01 ENCOUNTER — Inpatient Hospital Stay: Payer: Medicare Other

## 2018-09-01 ENCOUNTER — Telehealth: Payer: Self-pay

## 2018-09-01 ENCOUNTER — Encounter (HOSPITAL_COMMUNITY): Payer: Medicare Other

## 2018-09-01 ENCOUNTER — Other Ambulatory Visit: Payer: Self-pay | Admitting: Nurse Practitioner

## 2018-09-01 ENCOUNTER — Other Ambulatory Visit: Payer: Self-pay

## 2018-09-01 DIAGNOSIS — D631 Anemia in chronic kidney disease: Secondary | ICD-10-CM

## 2018-09-01 DIAGNOSIS — N183 Chronic kidney disease, stage 3 (moderate): Secondary | ICD-10-CM | POA: Diagnosis not present

## 2018-09-01 DIAGNOSIS — N189 Chronic kidney disease, unspecified: Principal | ICD-10-CM

## 2018-09-01 LAB — PREPARE RBC (CROSSMATCH)

## 2018-09-01 MED ORDER — SODIUM CHLORIDE 0.9% IV SOLUTION
250.0000 mL | Freq: Once | INTRAVENOUS | Status: AC
  Administered 2018-09-01: 250 mL via INTRAVENOUS
  Filled 2018-09-01: qty 250

## 2018-09-01 NOTE — Telephone Encounter (Signed)
[facility] Pre-operative Risk Assessment     Request for surgical clearance:     Endoscopy Procedure  What type of surgery is being performed?     Endoscopy/colonoscopy  When is this surgery scheduled?    April 2020  What type of clearance is required ?   Pharmacy  Are there any medications that need to be held prior to surgery and how long? Eliquis 4 days  Practice name and name of physician performing surgery?  [name],   [facility]  What is your office phone and fax number?      Phone- [phone]  Fax- [phone] Contact: Lemar Lofty, CMA  Anesthesia type (None, local, MAC, general) ?   MAC

## 2018-09-01 NOTE — Telephone Encounter (Signed)
Thanks Jan. I will see her in clinic on 3/16 and see if there are any openings that come up where we can do her procedure sooner. Thanks

## 2018-09-01 NOTE — Telephone Encounter (Signed)
Please advise.

## 2018-09-01 NOTE — Telephone Encounter (Signed)
-----  Message from Yetta Flock, MD sent at 08/31/2018  5:15 PM EDT ----- Jan I think this patient warrants EGD and colonoscopy in first available slot. Can you help direct book her for a double, first available, with me? She will need clearance to hold her Eliquis if she has not already stopped it. Thanks ----- Message ----- From: Ladell Pier, MD Sent: 08/30/2018   5:40 PM EDT To: Yetta Flock, MD  She has a history of chronic anemia, worse over the last several months and unresponsive to IV iron and erythropoietin therapy.  A stool Hemoccult in the office today is grossly heme positive.We made an urgent referral to you to consider a repeat endoscopic evaluation.  She had an upper endoscopy in 2017 and the last colonoscopy was in 2013Thanks,Brad

## 2018-09-01 NOTE — Patient Instructions (Signed)
Blood Transfusion, Adult, Care After This sheet gives you information about how to care for yourself after your procedure. Your doctor may also give you more specific instructions. If you have problems or questions, contact your doctor. Follow these instructions at home:   Take over-the-counter and prescription medicines only as told by your doctor.  Go back to your normal activities as told by your doctor.  Follow instructions from your doctor about how to take care of the area where an IV tube was put into your vein (insertion site). Make sure you: ? Wash your hands with soap and water before you change your bandage (dressing). If there is no soap and water, use hand sanitizer. ? Change your bandage as told by your doctor.  Check your IV insertion site every day for signs of infection. Check for: ? More redness, swelling, or pain. ? More fluid or blood. ? Warmth. ? Pus or a bad smell. Contact a doctor if:  You have more redness, swelling, or pain around the IV insertion site.  You have more fluid or blood coming from the IV insertion site.  Your IV insertion site feels warm to the touch.  You have pus or a bad smell coming from the IV insertion site.  Your pee (urine) turns pink, red, or brown.  You feel weak after doing your normal activities. Get help right away if:  You have signs of a serious allergic or body defense (immune) system reaction, including: ? Itchiness. ? Hives. ? Trouble breathing. ? Anxiety. ? Pain in your chest or lower back. ? Fever, flushing, and chills. ? Fast pulse. ? Rash. ? Watery poop (diarrhea). ? Throwing up (vomiting). ? Dark pee. ? Serious headache. ? Dizziness. ? Stiff neck. ? Yellow color in your face or the white parts of your eyes (jaundice). Summary  After a blood transfusion, return to your normal activities as told by your doctor.  Every day, check for signs of infection where the IV tube was put into your vein.  Some  signs of infection are warm skin, more redness and pain, more fluid or blood, and pus or a bad smell where the needle went in.  Contact your doctor if you feel weak or have any unusual symptoms. This information is not intended to replace advice given to you by your health care provider. Make sure you discuss any questions you have with your health care provider. Document Released: 06/29/2014 Document Revised: 01/31/2016 Document Reviewed: 01/31/2016 Elsevier Interactive Patient Education  [employer].

## 2018-09-01 NOTE — Telephone Encounter (Signed)
Patient has appt with [name] on Monday, 3-16.

## 2018-09-01 NOTE — Telephone Encounter (Signed)
Tried to call number provided....number not in use. I relayed the information to Sonia Side with [facility] through email.

## 2018-09-01 NOTE — Telephone Encounter (Signed)
OK TO DO

## 2018-09-01 NOTE — Telephone Encounter (Signed)
Called and LM for pt to call back asap to discuss ECL. Scheduled for 4-8 at 1:30pm, Dr. Doyne Keel first available. Request to hold Eliquis for 4 days (GFR = 29) sent to [facility]. Pt will need previsit

## 2018-09-01 NOTE — Telephone Encounter (Signed)
Pharm please address Eliquis

## 2018-09-02 ENCOUNTER — Telehealth (INDEPENDENT_AMBULATORY_CARE_PROVIDER_SITE_OTHER): Payer: Self-pay | Admitting: Orthopaedic Surgery

## 2018-09-02 ENCOUNTER — Other Ambulatory Visit (INDEPENDENT_AMBULATORY_CARE_PROVIDER_SITE_OTHER): Payer: Self-pay | Admitting: Orthopaedic Surgery

## 2018-09-02 LAB — BPAM RBC
Blood Product Expiration Date: 202004072359
Blood Product Expiration Date: 202004072359
ISSUE DATE / TIME: 202003121206
ISSUE DATE / TIME: 202003121206
Unit Type and Rh: 6200
Unit Type and Rh: 6200

## 2018-09-02 LAB — TYPE AND SCREEN
ABO/RH(D): A POS
Antibody Screen: NEGATIVE
Unit division: 0
Unit division: 0

## 2018-09-02 MED ORDER — HYDROCODONE-ACETAMINOPHEN 5-325 MG PO TABS: 1.0000 | ORAL_TABLET | Freq: Four times a day (QID) | ORAL | 0 refills | Status: DC | PRN

## 2018-09-02 NOTE — Telephone Encounter (Signed)
Patient aware of the below message

## 2018-09-02 NOTE — Telephone Encounter (Signed)
Pt called asking for some pain medication due to pt hip / leg hurting

## 2018-09-02 NOTE — Telephone Encounter (Signed)
I can provide a one-time script only for hydrocodone.  If she needs more, should get from her PCP or be referred to pain management.  Let her know that with the narcotic laws, we can't provide these meds long term.

## 2018-09-02 NOTE — Telephone Encounter (Signed)
Patient with diagnosis of ATRIAL FIBRILLATION on ELIQUIS for anticoagulation.    Procedure: ENDOSCOPY/COLONOSCOPY Date of procedure: April/2020  CHADS2-VASc score of  5 ( HTN, AGE X2, DM2, female)  CrCl = 30 ml/min (using adj.BW for obese patient); 49m/min using actual BW  Platelet count = 326  Per office protocol, patient can hold ELIQUIS for 2 days prior to procedure.

## 2018-09-02 NOTE — Telephone Encounter (Signed)
Please advise

## 2018-09-05 ENCOUNTER — Other Ambulatory Visit: Payer: Self-pay

## 2018-09-05 ENCOUNTER — Encounter: Payer: Self-pay | Admitting: Gastroenterology

## 2018-09-05 ENCOUNTER — Ambulatory Visit (INDEPENDENT_AMBULATORY_CARE_PROVIDER_SITE_OTHER): Payer: Medicare Other | Admitting: Gastroenterology

## 2018-09-05 ENCOUNTER — Telehealth: Payer: Self-pay | Admitting: *Deleted

## 2018-09-05 VITALS — BP 130/60 | HR 98 | Temp 97.5°F | Ht <= 58 in | Wt 230.0 lb

## 2018-09-05 DIAGNOSIS — K5900 Constipation, unspecified: Secondary | ICD-10-CM

## 2018-09-05 DIAGNOSIS — D649 Anemia, unspecified: Secondary | ICD-10-CM

## 2018-09-05 DIAGNOSIS — R195 Other fecal abnormalities: Secondary | ICD-10-CM | POA: Diagnosis not present

## 2018-09-05 NOTE — Patient Instructions (Signed)
If you are age 77 or older, your body mass index should be between 23-30. Your Body mass index is 48.07 kg/m. If this is out of the aforementioned range listed, please consider follow up with your Primary Care Provider.  If you are age 40 or younger, your body mass index should be between 19-25. Your Body mass index is 48.07 kg/m. If this is out of the aformentioned range listed, please consider follow up with your Primary Care Provider.   Please call us to confirm that you have arranged for your care giver to stay overnight to assist with colonoscopy prep: [phone], #[phone]. Ask for Jan or leave a message.  Thank you for entrusting me with your care and for choosing [facility], Dr. Homestead Cellar

## 2018-09-05 NOTE — Telephone Encounter (Signed)
Asking if her appointments on 3/23 and 3/24 can be combined. Informed her that we will try to move all to 3/24. Can't give aransep early or insurance will not cover it. Scheduling message sent.

## 2018-09-05 NOTE — Progress Notes (Signed)
HPI :  78 y/o female here for a follow up visit. I previously had seen her for anemia. We last saw her in June 2017. She had anemia in the setting of pericarditis and EGD showed erosive gastritis in the setting of steroids. She was given PPI. Prior colonoscopy in 2013 showed diverticulosis only. She had seen hematology and thought that her anemia was chronic, likely related to CKD. She was placed on Epogen and oral iron. She also has been on Eliquis in the setting of anticoagulation for atrial fibrillation, as well as Trental, she denies a history of CVA. She has chronically dark stools but were noted to be heme positive recently. Her anemia has been recurrent and needing blood transfusion in recent months. She had 2 units of PRBC on 2/26 and 2 IV iron infusions. On 3/10 she had heme positive stools.   She is wheelchair-bound for the most part when out of her house. She was using walker in her house. She has chronic constipation go several days without a bowel movement due to the iron. She's been using Dulcolax as needed. She denies any vomiting, she does have some occasional epigastric discomfort. She is not using any baseline laxatives. She takes Protonix 40 mg a day. She does have a caretaker who helps provide some care at home for her a few hours a day. She is concerned about her ability to bowel prep at home and she has no family can help her with this. She otherwise denies any cardiac symptoms.  Last Hgb on 3/10 was 8.0. MCV 102.   Echo 07/13/17 - EF 55-60%  Past Medical History:  Diagnosis Date  . Anemia   . 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 Anxiety   . Arthritis   . Atrial fibrillation ([place])   . Chronic back pain   . Chronic kidney disease (CKD), stage III (moderate) (HCC)   . Depression   . Diverticulosis   . Early cataracts, bilateral   . Esophageal stricture   . Gastritis   . GERD (gastroesophageal reflux disease)   . Heart murmur     " some doctors say that I have  one some say that I don"t "  . Hiatal hernia   . History of blood transfusion    "@ least w/1st knee OR"  . History of gout   . Hyperlipidemia   . Hypertension   . Intestinal obstruction ([place])   . Kidney stones   . Obesity   . Osteoarthritis   . Pericarditis   . Presence of permanent cardiac pacemaker   . Sjogren's disease ([place])   . Type II diabetes mellitus ([place])   . Vitamin B12 deficiency      Past Surgical History:  Procedure Laterality Date  . ABDOMINAL HYSTERECTOMY    . CHOLECYSTECTOMY OPEN    . COLECTOMY     for rectovaginal fistula  . COLONOSCOPY  01/07/2012   Procedure: COLONOSCOPY;  Surgeon: Lafayette Dragon, MD;  Location: [facility];  Service: Endoscopy;  Laterality: N/A;  . EP IMPLANTABLE DEVICE N/A 06/18/2015   Procedure: Pacemaker Implant;  Surgeon: Evans Lance, MD;  Location: [place] [facility];  Service: Cardiovascular;  Laterality: N/A;  . ESOPHAGOGASTRODUODENOSCOPY (EGD) WITH ESOPHAGEAL DILATION    . ESOPHAGOGASTRODUODENOSCOPY (EGD) WITH PROPOFOL N/A 08/12/2015   Procedure: ESOPHAGOGASTRODUODENOSCOPY (EGD) WITH PROPOFOL;  Surgeon: Manus Gunning, MD;  Location: [place];  Service: Gastroenterology;  Laterality: N/A;  . I&D EXTREMITY Right 08/06/2016  Procedure: DEBRIDEMENT PIP RIGHT RING FINGER;  Surgeon: Daryll Brod, MD;  Location: [place];  Service: Orthopedics;  Laterality: Right;  . INSERT / REPLACE / REMOVE PACEMAKER    . JOINT REPLACEMENT    . KNEE ARTHROSCOPY Right   . MASS EXCISION Right 08/06/2016   Procedure: EXCISION CYST;  Surgeon: Daryll Brod, MD;  Location: [place];  Service: Orthopedics;  Laterality: Right;  . REVISION TOTAL KNEE ARTHROPLASTY Left   . TOTAL KNEE ARTHROPLASTY Bilateral    Family History  Problem Relation Age of Onset  . Diabetes Mother   . Diabetes Brother   . Hypertension Sister   . Colon cancer Neg Hx   . Heart attack Neg Hx   . Stroke Neg Hx    Social History   Tobacco Use  . Smoking status: Former Smoker     Packs/day: 2.00    Years: 3.00    Pack years: 6.00    Types: Cigarettes    Last attempt to quit: 06/23/1983    Years since quitting: 35.2  . Smokeless tobacco: Never Used  Substance Use Topics  . Alcohol use: Yes    Alcohol/week: 2.0 standard drinks    Types: 2 Glasses of wine per week    Comment: occ  . Drug use: No   Current Outpatient Medications  Medication Sig Dispense Refill  . ACCU-CHEK AVIVA PLUS test strip 1 each by Other route daily.     Marland Kitchen acetaminophen (TYLENOL) 650 MG CR tablet Take 1,300 mg by mouth at bedtime as needed for pain.    Marland Kitchen denosumab (PROLIA) 60 MG/ML SOLN injection Inject 60 mg into the skin every 6 (six) months. Administer in upper arm, thigh, or abdomen    . dorzolamide (TRUSOPT) 2 % ophthalmic solution     . ELIQUIS 5 MG TABS tablet TAKE ONE TABLET TWICE DAILY 180 tablet 1  . Epoetin Alfa (PROCRIT IJ) Inject 1 Dose as directed every 14 (fourteen) days.     . febuxostat (ULORIC) 40 MG tablet Take 40 mg by mouth 3 (three) times a week.    . ferrous sulfate 325 (65 FE) MG EC tablet Take 1 tablet (325 mg total) by mouth 2 (two) times daily. 60 tablet 3  . furosemide (LASIX) 40 MG tablet Take 40 mg daily by mouth.    . gabapentin (NEURONTIN) 300 MG capsule Take 300 mg by mouth daily.     Marland Kitchen gabapentin (NEURONTIN) 400 MG capsule Take 400 mg by mouth at bedtime.     Marland Kitchen HYDROcodone-acetaminophen (NORCO/VICODIN) 5-325 MG tablet Take 1 tablet by mouth every 6 (six) hours as needed for moderate pain. 30 tablet 0  . JANUVIA 50 MG tablet Take 50 mg by mouth daily.     Marland Kitchen levothyroxine (SYNTHROID, LEVOTHROID) 50 MCG tablet Take 50 mcg daily by mouth.    . metoprolol succinate (TOPROL-XL) 25 MG 24 hr tablet TAKE ONE AND ONE-HALF TABLETS DAILY (Patient taking differently: Take 12.5 mg by mouth daily. ) 45 tablet 11  . montelukast (SINGULAIR) 10 MG tablet Take 10 mg by mouth at bedtime.     . Multiple Vitamin (MULITIVITAMIN WITH MINERALS) TABS Take 1 tablet by mouth daily.     . pantoprazole (PROTONIX) 40 MG tablet Take 1 tablet (40 mg total) by mouth daily. 30 tablet 1  . pentoxifylline (TRENTAL) 400 MG CR tablet Take 400 mg by mouth daily.    . Polyvinyl Alcohol-Povidone (REFRESH OP) Apply to eye.    Obie Dredge  300 UNIT/ML SOPN Inject 18 Units into the skin every evening.      No current facility-administered medications for this visit.    Allergies  Allergen Reactions  . Norvasc [Amlodipine Besylate] Shortness Of Breath  . Avapro [Irbesartan]     Headaches  . Glucophage [Metformin Hydrochloride] Other (See Comments)    "increases creatinine"  . Lisinopril     Headache   . Losartan     Headache   . Morphine And Related Other (See Comments)    "hallucinations"  . Simvastatin Other (See Comments)    Body aches  . Trulicity [Dulaglutide] Other (See Comments)    Severe mood swings     Review of Systems: All systems reviewed and negative except where noted in HPI.   Lab Results  Component Value Date   WBC 11.4 (H) 08/30/2018   HGB 8.0 (L) 08/30/2018   HCT 26.9 (L) 08/30/2018   MCV 102.3 (H) 08/30/2018   PLT 326 08/30/2018    Lab Results  Component Value Date   CREATININE 1.69 (H) 07/05/2018   BUN 62 (H) 07/05/2018   NA 140 07/05/2018   K 4.4 07/05/2018   CL 104 07/05/2018   CO2 28 07/05/2018    Lab Results  Component Value Date   ALT 8 07/05/2018   AST 12 (L) 07/05/2018   ALKPHOS 59 07/05/2018   BILITOT 0.3 07/05/2018    Lab Results  Component Value Date   IRON 48 08/16/2018   TIBC 336 08/16/2018   FERRITIN 27 08/16/2018     Physical Exam: BP 130/60   Pulse 98   Temp (!) 97.5 F (36.4 C)   Ht _0  (1.473 m)   Wt 230 lb (104.3 kg)   BMI 48.07 kg/m  Constitutional: Pleasant, female in no acute distress, in wheelchair HEENT: Normocephalic and atraumatic. Conjunctivae are normal. No scleral icterus. Neck supple.  Cardiovascular: Normal rate, Pulmonary/chest: Effort normal and breath sounds normal. No  wheezing, rales or rhonchi. Abdominal: Soft, protuberant / obese abdomen, nontender.  There are no masses palpable. No hepatomegaly. Extremities: no edema Lymphadenopathy: No cervical adenopathy noted. Neurological: Alert and oriented to person place and time. Skin: Skin is warm and dry. No rashes noted. Psychiatric: Normal mood and affect. Behavior is normal.   ASSESSMENT AND PLAN: 78 year old female here for reassessment of the following issues:  Heme positive stool / anemia / constipation  - she has chronic multifactorial anemia, with recent worsening and heme positive stools. She has required blood transfusion more recently. Her last colonoscopy was 7 years ago and she had gastritis on her last EGD. Now on PPI. Given her recent course of think an EGD and colonoscopy is reasonable to further evaluate this issue. I discussed risk and benefits of endoscopy and colonoscopy. She will need to hold Eliquis for 4 days prior to the procedure given her GFR. She has significant limitations with her mobility and due to this issue and she will require assistance for bowel preparation. The question is should she be admitted for assistance with this or can her home caregiver get her through the prep at home. The issue is that given the recent corona virus, elective procedures / admissions at the hospital will likely not be available for several weeks or perhaps months, and don't think she should wait that long. Further, she wants to avoid the hospital to minimize risk of the virus. If she can receive assistance to safely perform the bowel prep at home then we can  perform her procedures at our office in the next few weeks. She will speak with her caregiver, and if they are willing to help with bowel prep and stay overnight with her, this would be the preferred way to do this. If not, she will need to be admitted but logistics and timing of this are uncertain as above. I asked her to contact me in the next few days  and let me know how she wishes to proceed in regards to scheduling. In the interim, recommend double dose of Miralax daily and titrate up as needed to treat constipation. If she develops any overt bleeding she should contact us and hold the Eliquis. She agreed.   Westmoreland Cellar, MD [facility]

## 2018-09-05 NOTE — Telephone Encounter (Signed)
Due to patient's GFR of 29 on latest blood work (CMP from 07-05-18), requesting Eliquis to be held 4 days according to guidelines. Please advise

## 2018-09-05 NOTE — Telephone Encounter (Signed)
Dr. Lovena Le, see request below regarding hold eliquis for 4 days prior to endoscopy and colonoscopy. Is it ok from your perspective. Our pharmacist only recommended to hold eliquis for 2 days.

## 2018-09-06 ENCOUNTER — Telehealth: Payer: Self-pay

## 2018-09-06 ENCOUNTER — Telehealth: Payer: Self-pay | Admitting: Nurse Practitioner

## 2018-09-06 ENCOUNTER — Telehealth: Payer: Self-pay | Admitting: Gastroenterology

## 2018-09-06 NOTE — Telephone Encounter (Signed)
error 

## 2018-09-06 NOTE — Telephone Encounter (Signed)
ERROR

## 2018-09-06 NOTE — Telephone Encounter (Signed)
Based on the adjustment for BMI, guidelines indicate Eliquis to be held for 3 days prior to colonoscopy with polypectomy.  Please advise. Thank you

## 2018-09-06 NOTE — Telephone Encounter (Signed)
   Will await input from Dr. Lovena Le about if OK to hold Eliquis for 3 days. Dr. Lovena Le - Please route response to P [facility] DIV PREOP (the pre-op pool). Thank you.  Dayna Dunn PA-C

## 2018-09-06 NOTE — Telephone Encounter (Signed)
Chart reviewed as part of pre-op protocol. In addition to awaiting Dr. Tanna Furry answer, will also route to CMA Norman Herrlich to review the difference in calculated GFR vs creatinine clearance with their provider. Patient is morbidly obese with BMI of 48, which affects her actual renal clearance, so CrCl is actually 32m/min. Would request that GI review pharmacist recommendations regarding actual body weight to see if this impacts their request.  DMelina CopaPA-C

## 2018-09-06 NOTE — Telephone Encounter (Signed)
R/s appt per 3/16 sch message - pt aware of appt change

## 2018-09-07 ENCOUNTER — Telehealth: Payer: Self-pay | Admitting: *Deleted

## 2018-09-07 NOTE — Telephone Encounter (Signed)
I've recently seen her in clinic. She can stand with assistance and uses a walker at home. I think we can help her get through this at the [facility] and help her ambulate with assistance. She can't have her procedures done electively at the hospital right now with the corona virus issues, I think we can proceed at the [facility]

## 2018-09-07 NOTE — Telephone Encounter (Signed)
Reviewed patient's records for previsit. Patient with very limited mobility.Patient will  need to be able to transfer self from standing, or chair independently to stretcher to be eligible for procedure at [facility].Please advise

## 2018-09-09 NOTE — Telephone Encounter (Signed)
Ok to hold Eliquis. GT

## 2018-09-10 NOTE — Telephone Encounter (Addendum)
Will route to preop box for finalization

## 2018-09-12 ENCOUNTER — Telehealth: Payer: Self-pay | Admitting: *Deleted

## 2018-09-12 ENCOUNTER — Ambulatory Visit: Payer: Medicare Other | Admitting: Nurse Practitioner

## 2018-09-12 ENCOUNTER — Other Ambulatory Visit: Payer: Medicare Other

## 2018-09-12 NOTE — Telephone Encounter (Signed)
Pt return call

## 2018-09-12 NOTE — Telephone Encounter (Signed)
No answer and no machine; will try again.

## 2018-09-12 NOTE — Telephone Encounter (Signed)
Covid-19 travel screening questions  Have you traveled in the last 14 days? no If yes where?  Do you now or have you had a fever in the last 14 days? no  Do you have any respiratory symptoms of shortness of breath or cough now or in the last 14 days? no  Do you have a medical history of Congestive Heart Failure? no  Do you have a medical history of lung disease? no  Do you have any family members or close contacts with diagnosed or suspected Covid-19? no

## 2018-09-12 NOTE — Telephone Encounter (Signed)
   Primary Cardiologist: No primary care provider on file.  Electrophysiologist: Cristopher Peru, MD  Chart reviewed as part of pre-operative protocol coverage. Patient was contacted 09/12/2018 in reference to pre-operative risk assessment for pending surgery as outlined below.  Tricia Gonzales was last seen on 07/19/2018 by Dr. Lovena Le.  Since that day, Tricia Gonzales has done well withno new cardiac complaints. She has a history of permanent pacemaker and remote hx of afib.  Therefore, based on ACC/AHA guidelines, the patient would be at acceptable risk for the planned procedure without further cardiovascular testing.   OK to hold Eliquis for 3 days prior to procedure. Resume as soon as safe.   I will route this recommendation to the requesting party via Epic fax function and remove from pre-op pool.  Please call with questions.  Daune Perch, NP 09/12/2018, 9:07 AM

## 2018-09-13 ENCOUNTER — Ambulatory Visit (AMBULATORY_SURGERY_CENTER): Payer: Self-pay | Admitting: *Deleted

## 2018-09-13 ENCOUNTER — Ambulatory Visit: Payer: Medicare Other

## 2018-09-13 ENCOUNTER — Inpatient Hospital Stay (HOSPITAL_BASED_OUTPATIENT_CLINIC_OR_DEPARTMENT_OTHER): Payer: Medicare Other | Admitting: Nurse Practitioner

## 2018-09-13 ENCOUNTER — Inpatient Hospital Stay: Payer: Medicare Other

## 2018-09-13 ENCOUNTER — Other Ambulatory Visit: Payer: Medicare Other

## 2018-09-13 ENCOUNTER — Encounter: Payer: Self-pay | Admitting: Gastroenterology

## 2018-09-13 ENCOUNTER — Other Ambulatory Visit: Payer: Self-pay

## 2018-09-13 ENCOUNTER — Encounter: Payer: Self-pay | Admitting: Nurse Practitioner

## 2018-09-13 VITALS — Temp 96.0°F | Ht <= 58 in | Wt 229.0 lb

## 2018-09-13 VITALS — BP 154/68 | HR 76 | Temp 97.9°F | Resp 18 | Ht <= 58 in | Wt 228.2 lb

## 2018-09-13 DIAGNOSIS — E1122 Type 2 diabetes mellitus with diabetic chronic kidney disease: Secondary | ICD-10-CM | POA: Diagnosis not present

## 2018-09-13 DIAGNOSIS — D631 Anemia in chronic kidney disease: Secondary | ICD-10-CM | POA: Diagnosis not present

## 2018-09-13 DIAGNOSIS — N183 Chronic kidney disease, stage 3 unspecified: Secondary | ICD-10-CM

## 2018-09-13 DIAGNOSIS — E611 Iron deficiency: Secondary | ICD-10-CM

## 2018-09-13 DIAGNOSIS — R195 Other fecal abnormalities: Secondary | ICD-10-CM

## 2018-09-13 DIAGNOSIS — N189 Chronic kidney disease, unspecified: Principal | ICD-10-CM

## 2018-09-13 DIAGNOSIS — I482 Chronic atrial fibrillation, unspecified: Secondary | ICD-10-CM

## 2018-09-13 DIAGNOSIS — D649 Anemia, unspecified: Secondary | ICD-10-CM

## 2018-09-13 DIAGNOSIS — Z7901 Long term (current) use of anticoagulants: Secondary | ICD-10-CM

## 2018-09-13 LAB — CBC WITH DIFFERENTIAL (CANCER CENTER ONLY)
Abs Immature Granulocytes: 0.05 10*3/uL (ref 0.00–0.07)
Basophils Absolute: 0 10*3/uL (ref 0.0–0.1)
Basophils Relative: 0 %
Eosinophils Absolute: 0 10*3/uL (ref 0.0–0.5)
Eosinophils Relative: 0 %
HCT: 31.9 % — ABNORMAL LOW (ref 36.0–46.0)
Hemoglobin: 9.6 g/dL — ABNORMAL LOW (ref 12.0–15.0)
Immature Granulocytes: 1 %
Lymphocytes Relative: 5 %
Lymphs Abs: 0.5 10*3/uL — ABNORMAL LOW (ref 0.7–4.0)
MCH: 31.3 pg (ref 26.0–34.0)
MCHC: 30.1 g/dL (ref 30.0–36.0)
MCV: 103.9 fL — ABNORMAL HIGH (ref 80.0–100.0)
Monocytes Absolute: 0.5 10*3/uL (ref 0.1–1.0)
Monocytes Relative: 5 %
Neutro Abs: 9.7 10*3/uL — ABNORMAL HIGH (ref 1.7–7.7)
Neutrophils Relative %: 89 %
Platelet Count: 324 10*3/uL (ref 150–400)
RBC: 3.07 MIL/uL — ABNORMAL LOW (ref 3.87–5.11)
RDW: 17.6 % — ABNORMAL HIGH (ref 11.5–15.5)
WBC Count: 10.9 10*3/uL — ABNORMAL HIGH (ref 4.0–10.5)
nRBC: 0 % (ref 0.0–0.2)

## 2018-09-13 LAB — SAMPLE TO BLOOD BANK

## 2018-09-13 MED ORDER — SUPREP BOWEL PREP KIT 17.5-3.13-1.6 GM/177ML PO SOLN: 1.0000 | Freq: Once | ORAL | 0 refills | Status: AC

## 2018-09-13 MED ORDER — DARBEPOETIN ALFA 200 MCG/0.4ML IJ SOSY
PREFILLED_SYRINGE | INTRAMUSCULAR | Status: AC
  Filled 2018-09-13: qty 0.4

## 2018-09-13 MED ORDER — DARBEPOETIN ALFA 200 MCG/0.4ML IJ SOSY
200.0000 ug | PREFILLED_SYRINGE | Freq: Once | INTRAMUSCULAR | Status: AC
  Administered 2018-09-13: 200 ug via SUBCUTANEOUS

## 2018-09-13 MED ORDER — DARBEPOETIN ALFA 200 MCG/0.4ML IJ SOSY: 200.0000 ug | PREFILLED_SYRINGE | Freq: Once | INTRAMUSCULAR | Status: DC

## 2018-09-13 NOTE — Patient Instructions (Signed)

## 2018-09-13 NOTE — Progress Notes (Signed)
No egg or soy allergy known to patient  No issues with past sedation with any surgeries  or procedures, no intubation problems  No diet pills per patient No home 02 use per patient  Pt DID have  issues with constipation  Patient will continue her Miralx until day of procedure A fib or A flutter wil lhold Eliquis x 3 days, Trental x 5 days EMMI video offered and declined Patient requested to go down stairs to see stretcher to assess if she is able to transfer to stretcher. Patient with difficulty. Assistance x 3 at mod assistance. Patient unable to move or lift her left leg Dr Havery [name] in to see the patient during previsit. Patient and MD discussing options. Related to [place], pt. will not be able to schedule as an admission to the hospital or as an elective procedure. Dr Havery [name] feels that the patient needs these procedures asap related to continued anemia. Patient in agreement. Patient also aware that positioning at the hospital would be the same. Patient verbalized her agreement to proceed with [place] for care.

## 2018-09-13 NOTE — Progress Notes (Signed)
[place] OFFICE PROGRESS NOTE   Diagnosis: Anemia, renal insufficiency  INTERVAL HISTORY:   Tricia Gonzales returns as scheduled.  She was found to have heme positive stool 08/30/2018.  She has seen [name].  The plan is for an EGD and colonoscopy.  She was transfused 2 units of blood on 09/01/2018.  She overall is feeling better.  Specifically she has noted improvement in her energy level.  She continues oral iron, 3 tablets a day.  She continues to have pain at the left back and leg.  About a week ago she noticed a rash at the left upper arm.  No pruritus.  She thinks the rash is improving.  Objective:  Vital signs in last 24 hours:  Blood pressure (!) 154/68, pulse 76, temperature 97.9 F (36.6 C), temperature source Oral, resp. rate 18, height _0  (1.473 m), weight 228 lb 3.2 oz (103.5 kg), SpO2 98 %.    Resp: Respirations even and unlabored. Neuro: Alert and oriented. Skin: Erythematous macular rash left upper inner arm.   Lab Results:  Lab Results  Component Value Date   WBC 10.9 (H) 09/13/2018   HGB 9.6 (L) 09/13/2018   HCT 31.9 (L) 09/13/2018   MCV 103.9 (H) 09/13/2018   PLT 324 09/13/2018   NEUTROABS 9.7 (H) 09/13/2018    Imaging:  No results found.  Medications: I have reviewed the patient's current medications.  Assessment/Plan: 1. 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; quantitative immunoglobulins show IgG mildly decreased 654 (range [phone]), IgA and IgM in normal range; polyclonal serum light chain  elevation  11/15/2015-hemoglobin 11.2  11/29/2015-hemoglobin 11.1  12/12/2005-hemoglobin 10.4; Aranesp 100 g every 2 weeks initiated  02/07/2016-hemoglobin 11.0, Aranesp held, changed to an every 3 week schedule  Progressive anemia and decreased iron stores December 2017, treated with IV iron January 2018 with improvement  She did not require Aranesp for 2 months and resumed Aranesp on a monthly schedule for 08/2016  Aranesp every 6 weeks beginning 05/04/2017  Progressive anemia and decreased iron stores 07/05/2017; IV irongiven2/11/2017 and 08/06/2017  Persistent severe anemia, Aranesp change to every 2 weeks beginning 10/19/2017  Persistent anemia, decreased iron stores 11/30/2017  IV iron 12/13/2017, 12/20/2017,  Decreased iron stores 05/17/2018  IV iron1/08/2018  07/05/2018-no serum M spike, polyclonal serum light chain elevation, IgG mildly decreased with IgA and IgM within normal range   2 units of blood 08/17/2018  IV iron 08/17/2018 and 08/24/2018  Stool heme +08/30/2018, referred to GI  2 units of blood 09/01/2018  2. Chronic renal insufficiency  3. Atrial fibrillation-maintained on apixaban  4. Osteoarthritis  5. Diabetes  6. Sjogren's disease  7. Pericarditis February 2017  8.Colonoscopy 01/07/2012-prior segmental colectomy in the sigmoid colon; moderate diverticulosis throughout the colon; otherwise normal examination   Disposition: Tricia Gonzales appears unchanged.  She has anemia secondary to renal failure as well as GI bleeding.  The plan is to continue Aranesp every 2 weeks.  She will continue oral iron.  We will give IV iron as needed.  She is scheduled for an upper endoscopy and colonoscopy in just over 2 weeks.  The rash at the left upper arm  appears consistent with contact dermatitis.  She will contact our office if the rash does not continue to improve.  She will return for lab and Aranesp on 09/27/2018.  We will check iron  studies at that time.  She will return for lab, follow-up and Aranesp in 4 weeks.  She will contact the office in the interim with any problems.  Plan reviewed with Dr. Benay Spice.    Ned Card ANP/GNP-BC   09/13/2018  1:36 PM

## 2018-09-16 ENCOUNTER — Telehealth: Payer: Self-pay | Admitting: Oncology

## 2018-09-16 NOTE — Telephone Encounter (Signed)
Scheduled appointments per 03/24 los. Left a voicemail and will mail updated schedule.

## 2018-09-22 ENCOUNTER — Telehealth: Payer: Self-pay | Admitting: Rheumatology

## 2018-09-22 ENCOUNTER — Telehealth: Payer: Self-pay

## 2018-09-22 ENCOUNTER — Telehealth (INDEPENDENT_AMBULATORY_CARE_PROVIDER_SITE_OTHER): Payer: Self-pay | Admitting: Orthopaedic Surgery

## 2018-09-22 NOTE — Telephone Encounter (Signed)
Gevena Cotton, Manager with [facility] left a voicemail stating patient is on service for physical therapy and occupational therapy.  Joycelyn Schmid states at this time patient is requesting an MSW for community resources and long term planning which we can provide.  Joycelyn Schmid is requesting a verbal order if Dr. Durward Fortes approves.  Please call [phone]

## 2018-09-22 NOTE — Telephone Encounter (Signed)
-----  Message from Owens Shark, NP sent at 09/22/2018 12:09 PM EDT ----- Please call and check on her.

## 2018-09-22 NOTE — Telephone Encounter (Signed)
TC to Tricia Gonzales per Ned Card Left voicemail message to return call, Pt. Returned phone call stated that she was doing well, Informed her that if she should have any problems or concerns to give [place] a call.

## 2018-09-22 NOTE — Telephone Encounter (Signed)
Margaret Conover, Manager with [facility] left a voicemail stating patient is on service for physical therapy and occupational therapy.  Margaret states at this time patient is requesting an MSW for community resources and long term planning which we can provide.  Margaret is requesting a verbal order if Dr. Whitfield approves.  Please call #[phone]   °

## 2018-09-23 NOTE — Telephone Encounter (Signed)
Spoke with Joycelyn Schmid and let know that Dr.Whitfield is okay with this plan of care.

## 2018-09-23 NOTE — Telephone Encounter (Signed)
Ok to order

## 2018-09-23 NOTE — Telephone Encounter (Signed)
Please see below.

## 2018-09-26 ENCOUNTER — Telehealth: Payer: Self-pay | Admitting: Nurse Practitioner

## 2018-09-26 ENCOUNTER — Ambulatory Visit: Payer: Medicare Other

## 2018-09-26 ENCOUNTER — Other Ambulatory Visit: Payer: Medicare Other

## 2018-09-26 NOTE — Telephone Encounter (Signed)
I spoke with Tricia Gonzales regarding the lab and injection appointments she canceled this week.  I recommended we reschedule both.  She states she is unable to come in for the appointments and at present "feels fine".  I reviewed signs and symptoms suggestive of progressive anemia.  She will call sooner than her next appointment if she has any problems.

## 2018-09-27 ENCOUNTER — Ambulatory Visit: Payer: Medicare Other

## 2018-09-27 ENCOUNTER — Other Ambulatory Visit: Payer: Medicare Other

## 2018-09-27 ENCOUNTER — Telehealth: Payer: Self-pay

## 2018-09-27 ENCOUNTER — Telehealth: Payer: Self-pay | Admitting: Internal Medicine

## 2018-09-27 ENCOUNTER — Other Ambulatory Visit: Payer: Self-pay

## 2018-09-27 ENCOUNTER — Ambulatory Visit (INDEPENDENT_AMBULATORY_CARE_PROVIDER_SITE_OTHER): Payer: Medicare Other | Admitting: *Deleted

## 2018-09-27 DIAGNOSIS — I442 Atrioventricular block, complete: Secondary | ICD-10-CM

## 2018-09-27 LAB — CUP PACEART REMOTE DEVICE CHECK
Battery Remaining Longevity: 85 mo
Battery Voltage: 3.01 V
Brady Statistic AP VP Percent: 16.11 %
Brady Statistic AP VS Percent: 1.29 %
Brady Statistic AS VP Percent: 50.78 %
Brady Statistic AS VS Percent: 31.82 %
Brady Statistic RA Percent Paced: 17.31 %
Brady Statistic RV Percent Paced: 66.47 %
Date Time Interrogation Session: 20200407170705
Implantable Lead Implant Date: 20161227
Implantable Lead Implant Date: 20161227
Implantable Lead Location: 753859
Implantable Lead Location: 753860
Implantable Lead Model: 5076
Implantable Lead Model: 5076
Implantable Pulse Generator Implant Date: 20161227
Lead Channel Impedance Value: 380 Ohm
Lead Channel Impedance Value: 380 Ohm
Lead Channel Impedance Value: 380 Ohm
Lead Channel Impedance Value: 399 Ohm
Lead Channel Pacing Threshold Amplitude: 0.625 V
Lead Channel Pacing Threshold Amplitude: 1.125 V
Lead Channel Pacing Threshold Pulse Width: 0.4 ms
Lead Channel Pacing Threshold Pulse Width: 0.4 ms
Lead Channel Sensing Intrinsic Amplitude: 14.75 mV
Lead Channel Sensing Intrinsic Amplitude: 14.75 mV
Lead Channel Sensing Intrinsic Amplitude: 2.5 mV
Lead Channel Sensing Intrinsic Amplitude: 2.5 mV
Lead Channel Setting Pacing Amplitude: 2 V
Lead Channel Setting Pacing Amplitude: 2.5 V
Lead Channel Setting Pacing Pulse Width: 0.4 ms
Lead Channel Setting Sensing Sensitivity: 2.8 mV

## 2018-09-27 NOTE — Telephone Encounter (Signed)
New Message           Patient is returning a call concerning her transmission. Patient would like a call back.

## 2018-09-27 NOTE — Telephone Encounter (Signed)
Left message for patient to remind of missed remote transmission.  

## 2018-09-28 ENCOUNTER — Encounter: Payer: Medicare Other | Admitting: Gastroenterology

## 2018-09-29 ENCOUNTER — Telehealth: Payer: Self-pay | Admitting: Gastroenterology

## 2018-09-29 NOTE — Telephone Encounter (Signed)
Pt stated that her colonoscopy appt was rescheduled and she was not aware that she had to arrive an hour ahead.  Pt reported that she is in a wheelchair and cannot leave the house before 11:00 AM.

## 2018-09-29 NOTE — Telephone Encounter (Signed)
Called pt to discuss rescheduling her endo/colon until later. Pt states she needs more time to get ready in the am and wants to wait until the COVID-19 peak has slowed down. Pt was rescheduled for her endo/colon for 11/08/2018 at 1:30pm. I reviewed her medications and prep instructions with the pt. Instructed pt when to hold her Eliquis and Trental as directed and how to take her diabetic meds. Pt understood. She will call back if she has further questions. Gwyndolyn Saxon in [facility]

## 2018-10-03 ENCOUNTER — Telehealth (INDEPENDENT_AMBULATORY_CARE_PROVIDER_SITE_OTHER): Payer: Self-pay | Admitting: Orthopaedic Surgery

## 2018-10-03 NOTE — Telephone Encounter (Signed)
Cindee Salt from [place] [facility] called requesting continuation of physical therapy 1 time/week for 2 weeks.  Please call back with verbal order at [phone]

## 2018-10-04 NOTE — Telephone Encounter (Signed)
Spoke with Cindee Salt at [facility]

## 2018-10-04 NOTE — Telephone Encounter (Signed)
Please advise.

## 2018-10-04 NOTE — Telephone Encounter (Signed)
ok

## 2018-10-05 ENCOUNTER — Encounter: Payer: Self-pay | Admitting: Cardiology

## 2018-10-05 NOTE — Progress Notes (Signed)
Remote pacemaker transmission.   

## 2018-10-06 ENCOUNTER — Encounter: Payer: Medicare Other | Admitting: Gastroenterology

## 2018-10-11 ENCOUNTER — Inpatient Hospital Stay: Payer: Medicare Other | Attending: Oncology

## 2018-10-11 ENCOUNTER — Inpatient Hospital Stay (HOSPITAL_BASED_OUTPATIENT_CLINIC_OR_DEPARTMENT_OTHER): Payer: Medicare Other | Admitting: Nurse Practitioner

## 2018-10-11 ENCOUNTER — Inpatient Hospital Stay: Payer: Medicare Other

## 2018-10-11 ENCOUNTER — Other Ambulatory Visit: Payer: Self-pay

## 2018-10-11 ENCOUNTER — Encounter: Payer: Self-pay | Admitting: Nurse Practitioner

## 2018-10-11 ENCOUNTER — Telehealth: Payer: Self-pay | Admitting: Nurse Practitioner

## 2018-10-11 VITALS — BP 123/65 | HR 81 | Temp 98.0°F | Resp 18

## 2018-10-11 DIAGNOSIS — D631 Anemia in chronic kidney disease: Secondary | ICD-10-CM | POA: Diagnosis present

## 2018-10-11 DIAGNOSIS — N183 Chronic kidney disease, stage 3 unspecified: Secondary | ICD-10-CM

## 2018-10-11 DIAGNOSIS — N189 Chronic kidney disease, unspecified: Principal | ICD-10-CM

## 2018-10-11 LAB — CBC WITH DIFFERENTIAL (CANCER CENTER ONLY)
Abs Immature Granulocytes: 0.08 10*3/uL — ABNORMAL HIGH (ref 0.00–0.07)
Basophils Absolute: 0 10*3/uL (ref 0.0–0.1)
Basophils Relative: 0 %
Eosinophils Absolute: 0 10*3/uL (ref 0.0–0.5)
Eosinophils Relative: 0 %
HCT: 28.8 % — ABNORMAL LOW (ref 36.0–46.0)
Hemoglobin: 8.7 g/dL — ABNORMAL LOW (ref 12.0–15.0)
Immature Granulocytes: 1 %
Lymphocytes Relative: 4 %
Lymphs Abs: 0.5 10*3/uL — ABNORMAL LOW (ref 0.7–4.0)
MCH: 31.6 pg (ref 26.0–34.0)
MCHC: 30.2 g/dL (ref 30.0–36.0)
MCV: 104.7 fL — ABNORMAL HIGH (ref 80.0–100.0)
Monocytes Absolute: 0.6 10*3/uL (ref 0.1–1.0)
Monocytes Relative: 5 %
Neutro Abs: 11 10*3/uL — ABNORMAL HIGH (ref 1.7–7.7)
Neutrophils Relative %: 90 %
Platelet Count: 331 10*3/uL (ref 150–400)
RBC: 2.75 MIL/uL — ABNORMAL LOW (ref 3.87–5.11)
RDW: 15.3 % (ref 11.5–15.5)
WBC Count: 12.1 10*3/uL — ABNORMAL HIGH (ref 4.0–10.5)
nRBC: 0 % (ref 0.0–0.2)

## 2018-10-11 LAB — IRON AND TIBC
Iron: 60 ug/dL (ref 41–142)
Saturation Ratios: 17 % — ABNORMAL LOW (ref 21–57)
TIBC: 349 ug/dL (ref 236–444)
UIBC: 289 ug/dL (ref 120–384)

## 2018-10-11 LAB — FERRITIN: Ferritin: 44 ng/mL (ref 11–307)

## 2018-10-11 LAB — SAMPLE TO BLOOD BANK

## 2018-10-11 MED ORDER — DARBEPOETIN ALFA 200 MCG/0.4ML IJ SOSY
PREFILLED_SYRINGE | INTRAMUSCULAR | Status: AC
  Filled 2018-10-11: qty 0.4

## 2018-10-11 MED ORDER — DARBEPOETIN ALFA 200 MCG/0.4ML IJ SOSY: 200.0000 ug | PREFILLED_SYRINGE | Freq: Once | INTRAMUSCULAR | Status: DC

## 2018-10-11 MED ORDER — DARBEPOETIN ALFA 200 MCG/0.4ML IJ SOSY
200.0000 ug | PREFILLED_SYRINGE | Freq: Once | INTRAMUSCULAR | Status: AC
  Administered 2018-10-11: 200 ug via SUBCUTANEOUS

## 2018-10-11 NOTE — Progress Notes (Signed)
  [place] OFFICE PROGRESS NOTE   Diagnosis: Anemia, renal insufficiency  INTERVAL HISTORY:   Tricia Gonzales returns for follow-up.  She last received an Aranesp injection 09/13/2018.  She canceled the most recent injection appointment scheduled 09/27/2018.  Last blood transfusion 09/01/2018.  She denies bleeding.  She does not feel she needs a blood transfusion.  Colonoscopy is scheduled on 11/08/2018.  Main complaint continues to be pain at the left hip and leg.  She takes Vicodin during the day and oxycodone at bedtime.  She denies fever, cough.  Stable dyspnea on exertion.  Objective:  Vital signs in last 24 hours:  Blood pressure 123/65, pulse 81, temperature 98 F (36.7 C), temperature source Oral, resp. rate 18, SpO2 96 %.    HEENT: Conjunctival pallor. Resp: Respirations even, unlabored. Vascular: No leg edema. Skin: Pale appearing.   Lab Results:  Lab Results  Component Value Date   WBC 12.1 (H) 10/11/2018   HGB 8.7 (L) 10/11/2018   HCT 28.8 (L) 10/11/2018   MCV 104.7 (H) 10/11/2018   PLT 331 10/11/2018   NEUTROABS 11.0 (H) 10/11/2018    Imaging:  No results found.  Medications: I have reviewed the patient's current medications.  Assessment/Plan: 1. 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; quantitative immunoglobulins show IgG mildly decreased 654 (range [phone]), IgA and IgM in normal range; polyclonal serum light chain elevation  11/15/2015-hemoglobin 11.2  11/29/2015-hemoglobin 11.1  12/12/2005-hemoglobin 10.4;  Aranesp 100 g every 2 weeks initiated  02/07/2016-hemoglobin 11.0, Aranesp held, changed to an every 3 week schedule  Progressive anemia and decreased iron stores December 2017, treated with IV iron January 2018 with improvement  She did not require Aranesp for 2 months and resumed Aranesp on a monthly schedule for 08/2016  Aranesp every 6 weeks beginning 05/04/2017  Progressive anemia and decreased iron stores 07/05/2017; IV irongiven2/11/2017 and 08/06/2017  Persistent severe anemia, Aranesp change to every 2 weeks beginning 10/19/2017  Persistent anemia, decreased iron stores 11/30/2017  IV iron 12/13/2017, 12/20/2017,  Decreased iron stores 05/17/2018  IV iron1/08/2018  07/05/2018-no serum M spike, polyclonal serum light chain elevation, IgG mildly decreased with IgA and IgM within normal range  2 units of blood 08/17/2018  IV iron 08/17/2018 and 08/24/2018  Stool heme +08/30/2018, referred to GI  2 units of blood 09/01/2018  2. Chronic renal insufficiency  3. Atrial fibrillation-maintained on apixaban  4. Osteoarthritis  5. Diabetes  6. Sjogren's disease  7. Pericarditis February 2017  8.Colonoscopy 01/07/2012-prior segmental colectomy in the sigmoid colon; moderate diverticulosis throughout the colon; otherwise normal examination    Disposition: Tricia Gonzales appears unchanged.  Plan to continue every 2-week Aranesp with lab.  She is scheduled for a colonoscopy in approximately 1 month to evaluate heme positive stool.  We will see her in follow-up in 2 weeks.  She will contact the office in the interim with any problems.  We specifically discussed signs/symptoms suggestive of progressive anemia.  Plan reviewed with Dr. Benay Spice.  Ned Card ANP/GNP-BC   10/11/2018  12:09 PM

## 2018-10-11 NOTE — Telephone Encounter (Signed)
Scheduled appt per 4/21 los.

## 2018-10-12 ENCOUNTER — Other Ambulatory Visit: Payer: Self-pay | Admitting: Nurse Practitioner

## 2018-10-17 ENCOUNTER — Telehealth (INDEPENDENT_AMBULATORY_CARE_PROVIDER_SITE_OTHER): Payer: Self-pay | Admitting: Orthopaedic Surgery

## 2018-10-17 NOTE — Telephone Encounter (Signed)
Evelina Dun from [facility] called requesting Physical Therapy for patient 1 time/week for 4 weeks.  Please call back at [phone]

## 2018-10-18 NOTE — Telephone Encounter (Signed)
Please advise.

## 2018-10-18 NOTE — Telephone Encounter (Signed)
Ok

## 2018-10-18 NOTE — Telephone Encounter (Signed)
I called Cindee Salt at [facility]

## 2018-10-24 ENCOUNTER — Encounter: Payer: Self-pay | Admitting: *Deleted

## 2018-10-25 ENCOUNTER — Inpatient Hospital Stay: Payer: Medicare Other

## 2018-10-25 ENCOUNTER — Other Ambulatory Visit: Payer: Self-pay

## 2018-10-25 ENCOUNTER — Inpatient Hospital Stay (HOSPITAL_BASED_OUTPATIENT_CLINIC_OR_DEPARTMENT_OTHER): Payer: Medicare Other | Admitting: Nurse Practitioner

## 2018-10-25 ENCOUNTER — Encounter: Payer: Self-pay | Admitting: Nurse Practitioner

## 2018-10-25 ENCOUNTER — Inpatient Hospital Stay: Payer: Medicare Other | Attending: Oncology

## 2018-10-25 ENCOUNTER — Telehealth: Payer: Self-pay | Admitting: Nurse Practitioner

## 2018-10-25 VITALS — BP 135/55 | HR 78 | Temp 98.0°F | Resp 18 | Ht <= 58 in | Wt 221.3 lb

## 2018-10-25 VITALS — BP 134/56 | HR 66 | Temp 98.0°F | Resp 18

## 2018-10-25 DIAGNOSIS — R195 Other fecal abnormalities: Secondary | ICD-10-CM | POA: Diagnosis not present

## 2018-10-25 DIAGNOSIS — N183 Chronic kidney disease, stage 3 unspecified: Secondary | ICD-10-CM

## 2018-10-25 DIAGNOSIS — D631 Anemia in chronic kidney disease: Secondary | ICD-10-CM

## 2018-10-25 DIAGNOSIS — D649 Anemia, unspecified: Secondary | ICD-10-CM | POA: Diagnosis not present

## 2018-10-25 DIAGNOSIS — N189 Chronic kidney disease, unspecified: Principal | ICD-10-CM

## 2018-10-25 DIAGNOSIS — R634 Abnormal weight loss: Secondary | ICD-10-CM

## 2018-10-25 DIAGNOSIS — R0609 Other forms of dyspnea: Secondary | ICD-10-CM | POA: Diagnosis not present

## 2018-10-25 DIAGNOSIS — R0600 Dyspnea, unspecified: Secondary | ICD-10-CM

## 2018-10-25 DIAGNOSIS — I4891 Unspecified atrial fibrillation: Secondary | ICD-10-CM | POA: Diagnosis not present

## 2018-10-25 DIAGNOSIS — E1122 Type 2 diabetes mellitus with diabetic chronic kidney disease: Secondary | ICD-10-CM | POA: Diagnosis not present

## 2018-10-25 DIAGNOSIS — M3500 Sicca syndrome, unspecified: Secondary | ICD-10-CM | POA: Insufficient documentation

## 2018-10-25 LAB — CBC WITH DIFFERENTIAL (CANCER CENTER ONLY)
Abs Immature Granulocytes: 0.08 10*3/uL — ABNORMAL HIGH (ref 0.00–0.07)
Basophils Absolute: 0 10*3/uL (ref 0.0–0.1)
Basophils Relative: 0 %
Eosinophils Absolute: 0 10*3/uL (ref 0.0–0.5)
Eosinophils Relative: 0 %
HCT: 26.2 % — ABNORMAL LOW (ref 36.0–46.0)
Hemoglobin: 7.8 g/dL — ABNORMAL LOW (ref 12.0–15.0)
Immature Granulocytes: 1 %
Lymphocytes Relative: 4 %
Lymphs Abs: 0.4 10*3/uL — ABNORMAL LOW (ref 0.7–4.0)
MCH: 31.5 pg (ref 26.0–34.0)
MCHC: 29.8 g/dL — ABNORMAL LOW (ref 30.0–36.0)
MCV: 105.6 fL — ABNORMAL HIGH (ref 80.0–100.0)
Monocytes Absolute: 0.5 10*3/uL (ref 0.1–1.0)
Monocytes Relative: 5 %
Neutro Abs: 10.7 10*3/uL — ABNORMAL HIGH (ref 1.7–7.7)
Neutrophils Relative %: 90 %
Platelet Count: 375 10*3/uL (ref 150–400)
RBC: 2.48 MIL/uL — ABNORMAL LOW (ref 3.87–5.11)
RDW: 15.6 % — ABNORMAL HIGH (ref 11.5–15.5)
WBC Count: 11.8 10*3/uL — ABNORMAL HIGH (ref 4.0–10.5)
nRBC: 0 % (ref 0.0–0.2)

## 2018-10-25 LAB — RETICULOCYTES
Immature Retic Fract: 28.3 % — ABNORMAL HIGH (ref 2.3–15.9)
RBC.: 2.51 MIL/uL — ABNORMAL LOW (ref 3.87–5.11)
Retic Count, Absolute: 206.3 10*3/uL — ABNORMAL HIGH (ref 19.0–186.0)
Retic Ct Pct: 8.2 % — ABNORMAL HIGH (ref 0.4–3.1)

## 2018-10-25 LAB — SAMPLE TO BLOOD BANK

## 2018-10-25 LAB — PREPARE RBC (CROSSMATCH)

## 2018-10-25 MED ORDER — DARBEPOETIN ALFA 200 MCG/0.4ML IJ SOSY
200.0000 ug | PREFILLED_SYRINGE | Freq: Once | INTRAMUSCULAR | Status: AC
  Administered 2018-10-25: 16:00:00 200 ug via SUBCUTANEOUS

## 2018-10-25 MED ORDER — DARBEPOETIN ALFA 200 MCG/0.4ML IJ SOSY
PREFILLED_SYRINGE | INTRAMUSCULAR | Status: AC
  Filled 2018-10-25: qty 0.4

## 2018-10-25 MED ORDER — SODIUM CHLORIDE 0.9% IV SOLUTION
250.0000 mL | Freq: Once | INTRAVENOUS | Status: AC
  Administered 2018-10-25: 250 mL via INTRAVENOUS
  Filled 2018-10-25: qty 250

## 2018-10-25 NOTE — Progress Notes (Addendum)
[place] OFFICE PROGRESS NOTE   Diagnosis: Anemia, renal insufficiency  INTERVAL HISTORY:   Tricia Gonzales returns as scheduled.  She last received Aranesp 10/11/2018.  She continues to have dyspnea on exertion.  No fever or cough.  No blood with bowel movements.  Stools are black.  She attributes this to oral iron.  She denies abdominal pain.  She continues to lose weight.  She reports the weight loss is intentional.  Objective:  Vital signs in last 24 hours:  Blood pressure (!) 135/55, pulse 78, temperature 98 F (36.7 C), temperature source Oral, resp. rate 18, height _0  (1.473 m), weight 221 lb 4.8 oz (100.4 kg), SpO2 97 %.    HEENT: Conjunctival pallor. Resp: No respiratory distress. GI: Abdomen soft and nontender. Vascular: No leg edema. Skin: Pale appearing.   Lab Results:  Lab Results  Component Value Date   WBC 11.8 (H) 10/25/2018   HGB 7.8 (L) 10/25/2018   HCT 26.2 (L) 10/25/2018   MCV 105.6 (H) 10/25/2018   PLT 375 10/25/2018   NEUTROABS 10.7 (H) 10/25/2018    Imaging:  No results found.  Medications: I have reviewed the patient's current medications.  Assessment/Plan: 1. 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; quantitative immunoglobulins show IgG mildly decreased 654 (range [phone]), IgA and IgM in normal range; polyclonal serum light chain elevation  11/15/2015-hemoglobin 11.2  11/29/2015-hemoglobin 11.1  12/12/2005-hemoglobin 10.4; Aranesp 100 g every 2 weeks initiated  02/07/2016-hemoglobin 11.0,  Aranesp held, changed to an every 3 week schedule  Progressive anemia and decreased iron stores December 2017, treated with IV iron January 2018 with improvement  She did not require Aranesp for 2 months and resumed Aranesp on a monthly schedule for 08/2016  Aranesp every 6 weeks beginning 05/04/2017  Progressive anemia and decreased iron stores 07/05/2017; IV irongiven2/11/2017 and 08/06/2017  Persistent severe anemia, Aranesp change to every 2 weeks beginning 10/19/2017  Persistent anemia, decreased iron stores 11/30/2017  IV iron 12/13/2017, 12/20/2017,  Decreased iron stores 05/17/2018  IV iron1/08/2018  07/05/2018-no serum M spike, polyclonal serum light chain elevation, IgG mildly decreased with IgA and IgM within normal range  2 units of blood 08/17/2018  IV iron 08/17/2018 and 08/24/2018  Stool heme +08/30/2018, referred to GI  2 units of blood 09/01/2018  1 unit of blood 10/25/2018  2. Chronic renal insufficiency  3. Atrial fibrillation-maintained on apixaban  4. Osteoarthritis  5. Diabetes  6. Sjogren's disease  7. Pericarditis February 2017  8.Colonoscopy 01/07/2012-prior segmental colectomy in the sigmoid colon; moderate diverticulosis throughout the colon; otherwise normal examination    Disposition: Tricia Gonzales appears unchanged.  We reviewed the CBC from today.  Hemoglobin is lower.  We are arranging for a blood transfusion.  She will receive Aranesp today as planned.  She is scheduled for the colonoscopy 11/11/2018.  We will contact GI to see if this can be moved to a sooner date.  She will return for lab and Aranesp in 2 weeks.  We will see her in follow-up in 4 weeks.  She will contact the office in the interim with any problems.  Patient seen with Dr. Benay Spice.  Ned Card ANP/GNP-BC   10/25/2018  12:25 PM This was a shared visit with Ned Card.  Tricia Gonzales again has severe anemia.  She continues to have dark  stool.  I will contact [name] to request an expedited GI work-up.  We will arrange for a red cell transfusion this week.  She continues erythropoietin therapy.  Julieanne Manson, MD

## 2018-10-25 NOTE — Progress Notes (Signed)
Due to [facility] downtime, unable to scan blood product or override. Blood checked by Wendall Mola, RN and Lenox Ponds, LPN.  Blood started at 1510 Unit number [phone] Positive for M antigen Blood Bank Bracelet number- [id number] Unit- A RH positive  Expires Nov 08 2018

## 2018-10-25 NOTE — Telephone Encounter (Signed)
Scheduled appt per 5.5 los.  Left a VM of scheduled appts.

## 2018-10-25 NOTE — Patient Instructions (Signed)
Blood Transfusion, Adult, Care After This sheet gives you information about how to care for yourself after your procedure. Your doctor may also give you more specific instructions. If you have problems or questions, contact your doctor. Follow these instructions at home:   Take over-the-counter and prescription medicines only as told by your doctor.  Go back to your normal activities as told by your doctor.  Follow instructions from your doctor about how to take care of the area where an IV tube was put into your vein (insertion site). Make sure you: ? Wash your hands with soap and water before you change your bandage (dressing). If there is no soap and water, use hand sanitizer. ? Change your bandage as told by your doctor.  Check your IV insertion site every day for signs of infection. Check for: ? More redness, swelling, or pain. ? More fluid or blood. ? Warmth. ? Pus or a bad smell. Contact a doctor if:  You have more redness, swelling, or pain around the IV insertion site.  You have more fluid or blood coming from the IV insertion site.  Your IV insertion site feels warm to the touch.  You have pus or a bad smell coming from the IV insertion site.  Your pee (urine) turns pink, red, or brown.  You feel weak after doing your normal activities. Get help right away if:  You have signs of a serious allergic or body defense (immune) system reaction, including: ? Itchiness. ? Hives. ? Trouble breathing. ? Anxiety. ? Pain in your chest or lower back. ? Fever, flushing, and chills. ? Fast pulse. ? Rash. ? Watery poop (diarrhea). ? Throwing up (vomiting). ? Dark pee. ? Serious headache. ? Dizziness. ? Stiff neck. ? Yellow color in your face or the white parts of your eyes (jaundice). Summary  After a blood transfusion, return to your normal activities as told by your doctor.  Every day, check for signs of infection where the IV tube was put into your vein.  Some  signs of infection are warm skin, more redness and pain, more fluid or blood, and pus or a bad smell where the needle went in.  Contact your doctor if you feel weak or have any unusual symptoms. This information is not intended to replace advice given to you by your health care provider. Make sure you discuss any questions you have with your health care provider. Document Released: 06/29/2014 Document Revised: 01/31/2016 Document Reviewed: 01/31/2016 Elsevier Interactive Patient Education  2019 [place] (COVID-19) Are you at risk?  Are you at risk for the Coronavirus (COVID-19)?  To be considered HIGH RISK for Coronavirus (COVID-19), you have to meet the following criteria:  . Traveled to [place], [place], [place], [place] or [place]; or in the [place] to [place], [place], [place], or [place]; and have fever, cough, and shortness of breath within the last 2 weeks of travel OR . Been in close contact with a person diagnosed with COVID-19 within the last 2 weeks and have fever, cough, and shortness of breath . IF YOU DO NOT MEET THESE CRITERIA, YOU ARE CONSIDERED LOW RISK FOR COVID-19.  What to do if you are HIGH RISK for COVID-19?  Marland Kitchen If you are having a medical emergency, call 911. . Seek medical care right away. Before you go to a doctor's office, urgent care or emergency department, call ahead and tell them about your recent travel, contact with someone diagnosed with  COVID-19, and your symptoms. You should receive instructions from your physician's office regarding next steps of care.  . When you arrive at healthcare provider, tell the healthcare staff immediately you have returned from visiting [place], [place], [place], [place] or [place]; or traveled in the [place] to [place], [place], [place], or [place]; in the last two weeks or you have been in close contact with a person diagnosed with COVID-19 in the last 2 weeks.   . Tell the health care  staff about your symptoms: fever, cough and shortness of breath. . After you have been seen by a medical provider, you will be either: o Tested for (COVID-19) and discharged home on quarantine except to seek medical care if symptoms worsen, and asked to  - Stay home and avoid contact with others until you get your results (4-5 days)  - Avoid travel on public transportation if possible (such as bus, train, or airplane) or o Sent to the Emergency Department by EMS for evaluation, COVID-19 testing, and possible admission depending on your condition and test results.  What to do if you are LOW RISK for COVID-19?  Reduce your risk of any infection by using the same precautions used for avoiding the common cold or flu:  Marland Kitchen Wash your hands often with soap and warm water for at least 20 seconds.  If soap and water are not readily available, use an alcohol-based hand sanitizer with at least 60% alcohol.  . If coughing or sneezing, cover your mouth and nose by coughing or sneezing into the elbow areas of your shirt or coat, into a tissue or into your sleeve (not your hands). . Avoid shaking hands with others and consider head nods or verbal greetings only. . Avoid touching your eyes, nose, or mouth with unwashed hands.  . Avoid close contact with people who are sick. . Avoid places or events with large numbers of people in one location, like concerts or sporting events. . Carefully consider travel plans you have or are making. . If you are planning any travel outside or inside the [place], visit the CDC's Travelers' Health webpage for the latest health notices. . If you have some symptoms but not all symptoms, continue to monitor at home and seek medical attention if your symptoms worsen. . If you are having a medical emergency, call 911.   [place] / e-Visit: [email or web address]         [facility]:  [facility]: [phone]                   [facility]: [phone]

## 2018-10-26 LAB — BPAM RBC
Blood Product Expiration Date: 202005192359
ISSUE DATE / TIME: 202005051418
Unit Type and Rh: 6200

## 2018-10-26 LAB — TYPE AND SCREEN
ABO/RH(D): A POS
Antibody Screen: NEGATIVE
Unit division: 0

## 2018-10-27 NOTE — Progress Notes (Signed)
Blood ended at 1659.  VS obtained T 97.9, R 16, O2 100%, BP 143/58, P 68.  Patient was discharged in no distress.

## 2018-11-08 ENCOUNTER — Other Ambulatory Visit: Payer: Medicare Other

## 2018-11-08 ENCOUNTER — Encounter: Payer: Medicare Other | Admitting: Gastroenterology

## 2018-11-08 ENCOUNTER — Ambulatory Visit: Payer: Medicare Other

## 2018-11-09 ENCOUNTER — Telehealth: Payer: Self-pay | Admitting: *Deleted

## 2018-11-09 NOTE — Telephone Encounter (Signed)
Covid-19 travel screening questions  Have you traveled in the last 14 days?no If yes where?  Do you now or have you had a fever in the last 14 days?no  Do you have any respiratory symptoms of shortness of breath or cough now or in the last 14 days?no  Do you have a medical history of Congestive Heart Failure?  Do you have a medical history of lung disease?  Do you have any family members or close contacts with diagnosed or suspected Covid-19?no  Pt aware of care partner policy. Her care partner may come up and stay in the lobby as she will have to bring the pt up in a wheelchair. Pt will bring a mask with her. SM

## 2018-11-09 NOTE — Telephone Encounter (Signed)
No answer for first attempt COVID screen. Will call back. SM

## 2018-11-11 ENCOUNTER — Encounter: Payer: Self-pay | Admitting: Gastroenterology

## 2018-11-11 ENCOUNTER — Other Ambulatory Visit: Payer: Self-pay

## 2018-11-11 ENCOUNTER — Ambulatory Visit (AMBULATORY_SURGERY_CENTER): Payer: Medicare Other | Admitting: Gastroenterology

## 2018-11-11 VITALS — BP 136/62 | HR 74 | Temp 98.4°F | Resp 16 | Ht <= 58 in | Wt 229.0 lb

## 2018-11-11 DIAGNOSIS — K228 Other specified diseases of esophagus: Secondary | ICD-10-CM | POA: Diagnosis not present

## 2018-11-11 DIAGNOSIS — K648 Other hemorrhoids: Secondary | ICD-10-CM | POA: Diagnosis not present

## 2018-11-11 DIAGNOSIS — D649 Anemia, unspecified: Secondary | ICD-10-CM

## 2018-11-11 DIAGNOSIS — D122 Benign neoplasm of ascending colon: Secondary | ICD-10-CM

## 2018-11-11 DIAGNOSIS — K635 Polyp of colon: Secondary | ICD-10-CM

## 2018-11-11 DIAGNOSIS — K5730 Diverticulosis of large intestine without perforation or abscess without bleeding: Secondary | ICD-10-CM

## 2018-11-11 DIAGNOSIS — R195 Other fecal abnormalities: Secondary | ICD-10-CM

## 2018-11-11 MED ORDER — SODIUM CHLORIDE 0.9 % IV SOLN: 500.0000 mL | Freq: Once | INTRAVENOUS | Status: DC

## 2018-11-11 MED ORDER — SUCRALFATE 1 GM/10ML PO SUSP: 1.0000 g | Freq: Three times a day (TID) | ORAL | 3 refills | Status: DC

## 2018-11-11 NOTE — Progress Notes (Signed)
Report to PACU, RN, vss, BBS= Clear.

## 2018-11-11 NOTE — Patient Instructions (Addendum)
Thank you for allowing [place] to care for you today!  Await pathology results of polyp, approximately 2 weeks.  Resume Eliquis and Trental tomorrow 11/12/2018.  Resume other medications today.  Return to normal activities tomorrow.  Office will contact you to arrange EGD at [facility].  Note this new prescription (Carafate) will turn bowel movements dark       YOU HAD AN ENDOSCOPIC PROCEDURE TODAY AT [facility]:   Refer to the procedure report that was given to you for any specific questions about what was found during the examination.  If the procedure report does not answer your questions, please call your gastroenterologist to clarify.  If you requested that your care partner not be given the details of your procedure findings, then the procedure report has been included in a sealed envelope for you to review at your convenience later.  YOU SHOULD EXPECT: Some feelings of bloating in the abdomen. Passage of more gas than usual.  Walking can help get rid of the air that was put into your GI tract during the procedure and reduce the bloating. If you had a lower endoscopy (such as a colonoscopy or flexible sigmoidoscopy) you may notice spotting of blood in your stool or on the toilet paper. If you underwent a bowel prep for your procedure, you may not have a normal bowel movement for a few days.  Please Note:  You might notice some irritation and congestion in your nose or some drainage.  This is from the oxygen used during your procedure.  There is no need for concern and it should clear up in a day or so.  SYMPTOMS TO REPORT IMMEDIATELY:    Following upper endoscopy (EGD)  Vomiting of blood or coffee ground material  New chest pain or pain under the shoulder blades  Painful or persistently difficult swallowing  New shortness of breath  Fever of 100F or higher  Black, tarry-looking stools       Following lower endoscopy (colonoscopy or flexible  sigmoidoscopy):  Excessive amounts of blood in the stool  Significant tenderness or worsening of abdominal pains  Swelling of the abdomen that is new, acute  Fever of 100F or higher   For urgent or emergent issues, a gastroenterologist can be reached at any hour by calling [phone].   DIET:  We do recommend a small meal at first, but then you may proceed to your regular diet.  Drink plenty of fluids but you should avoid alcoholic beverages for 24 hours.  ACTIVITY:  You should plan to take it easy for the rest of today and you should NOT DRIVE or use heavy machinery until tomorrow (because of the sedation medicines used during the test).    FOLLOW UP: Our staff will call the number listed on your records 48-72 hours following your procedure to check on you and address any questions or concerns that you may have regarding the information given to you following your procedure. If we do not reach you, we will leave a message.  We will attempt to reach you two times.  During this call, we will ask if you have developed any symptoms of COVID 19. If you develop any symptoms (for example fever, flu-like symptoms, shortness of breath, cough etc.) before then, please call [phone].  If any biopsies were taken you will be contacted by phone or by letter within the next 1-3 weeks.  Please call us at [phone] if you have not heard  about the biopsies in 3 weeks.    SIGNATURES/CONFIDENTIALITY: You and/or your care partner have signed paperwork which will be entered into your electronic medical record.  These signatures attest to the fact that that the information above on your After Visit Summary has been reviewed and is understood.  Full responsibility of the confidentiality of this discharge information lies with you and/or your care-partner.

## 2018-11-11 NOTE — Progress Notes (Signed)
Called to room to assist during endoscopic procedure.  Patient ID and intended procedure confirmed with present staff. Received instructions for my participation in the procedure from the performing physician.

## 2018-11-11 NOTE — Op Note (Signed)
[place] Patient Name: Tricia Gonzales Procedure Date: 11/11/2018 12:55 PM MRN: [id number] Endoscopist: [name] , MD Age: 78 Referring MD:  Date of Birth: 1940-09-30 Gender: Female Account #: [id number] Procedure:                Colonoscopy Indications:              Heme positive stool, chronic multifactorial anemia,                            recent worsening with transfusion requirement Medicines:                Monitored Anesthesia Care Procedure:                Pre-Anesthesia Assessment:                           - Prior to the procedure, a History and Physical                            was performed, and patient medications and                            allergies were reviewed. The patient's tolerance of                            previous anesthesia was also reviewed. The risks                            and benefits of the procedure and the sedation                            options and risks were discussed with the patient.                            All questions were answered, and informed consent                            was obtained. Prior Anticoagulants: The patient has                            taken Eliquis (apixaban), last dose was 4 days                            prior to procedure. ASA Grade Assessment: III - A                            patient with severe systemic disease. After                            reviewing the risks and benefits, the patient was                            deemed in satisfactory condition to undergo the  procedure.                           After obtaining informed consent, the colonoscope                            was passed under direct vision. Throughout the                            procedure, the patient's blood pressure, pulse, and                            oxygen saturations were monitored continuously. The                            Colonoscope was introduced through the anus  and                            advanced to the the cecum, identified by                            appendiceal orifice and ileocecal valve. The                            colonoscopy was performed without difficulty. The                            patient tolerated the procedure well. The quality                            of the bowel preparation was good. The ileocecal                            valve, appendiceal orifice, and rectum were                            photographed. Scope In: 1:06:48 PM Scope Out: 1:23:17 PM Scope Withdrawal Time: 0 hours 13 minutes 32 seconds  Total Procedure Duration: 0 hours 16 minutes 29 seconds  Findings:                 The perianal and digital rectal examinations were                            normal.                           Many small and large-mouthed diverticula were found                            in the entire colon.                           There was evidence of a prior surgery in the  recto-sigmoid colon. This was characterized by                            visible sutures.                           A 5 mm polyp was found in the ascending colon. The                            polyp was sessile. The polyp was removed with a                            cold snare. Resection and retrieval were complete.                           Internal hemorrhoids were found during retroflexion.                           The exam was otherwise without abnormality. Complications:            No immediate complications. Estimated blood loss:                            Minimal. Estimated Blood Loss:     Estimated blood loss was minimal. Impression:               No cause for anemia on colonoscopy.                           - Diverticulosis in the entire examined colon.                           - End-to-end colo-colonic anastomosis,                            characterized by visible sutures.                           - One 5 mm  polyp in the ascending colon, removed                            with a cold snare. Resected and retrieved.                           - Internal hemorrhoids.                           - The examination was otherwise normal. Recommendation:           - Patient has a contact number available for                            emergencies. The signs and symptoms of potential                            delayed complications were discussed with the  patient. Return to normal activities tomorrow.                            Written discharge instructions were provided to the                            patient.                           - Resume previous diet.                           - Continue present medications. Resume Eliquis /                            Trental tomorrow                           - Await pathology results. Remo Lipps P. Jearldine Cassady, MD 11/11/2018 1:30:57 PM This report has been signed electronically.

## 2018-11-11 NOTE — Op Note (Signed)
[facility] Patient Name: Tricia Gonzales Procedure Date: 11/11/2018 12:56 PM MRN: [id number] Endoscopist: [name] , MD Age: 78 Referring MD:  Date of Birth: 1940/11/15 Gender: Female Account #: [id number] Procedure:                Upper GI endoscopy Indications:              Heme positive stool, worsening of chronic anemia,                            recent transfusion requirement Medicines:                Monitored Anesthesia Care Procedure:                Pre-Anesthesia Assessment:                           - Prior to the procedure, a History and Physical                            was performed, and patient medications and                            allergies were reviewed. The patient's tolerance of                            previous anesthesia was also reviewed. The risks                            and benefits of the procedure and the sedation                            options and risks were discussed with the patient.                            All questions were answered, and informed consent                            was obtained. Prior Anticoagulants: The patient has                            taken Eliquis (apixaban), last dose was 4 days                            prior to procedure. ASA Grade Assessment: III - A                            patient with severe systemic disease. After                            reviewing the risks and benefits, the patient was                            deemed in satisfactory condition to undergo the  procedure.                           After obtaining informed consent, the endoscope was                            passed under direct vision. Throughout the                            procedure, the patient's blood pressure, pulse, and                            oxygen saturations were monitored continuously. The                            Endoscope was introduced through the mouth, and                             advanced to the second part of duodenum. The upper                            GI endoscopy was accomplished without difficulty.                            The patient tolerated the procedure well. Scope In: Scope Out: Findings:                 Esophagogastric landmarks were identified: the                            Z-line was found at 39 cm, the gastroesophageal                            junction was found at 39 cm and the upper extent of                            the gastric folds was found at 39 cm from the                            incisors.                           One benign-appearing, intrinsic mild Shatski ring                            was widely patent and found at the GEJ.                           The exam of the esophagus was otherwise normal.                           Moderate gastric antral vascular ectasia was                            present in the gastric antrum  and in the pylorus                            with some spontaneous oozing.                           The exam of the stomach was otherwise normal.                           The duodenal bulb and second portion of the                            duodenum were normal. Complications:            No immediate complications. Estimated blood loss:                            None. Estimated Blood Loss:     Estimated blood loss: none. Impression:               - Esophagogastric landmarks identified.                           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 Recommendation:           - Patient has a contact number available for                             emergencies. The signs and symptoms of potential                            delayed complications were discussed with the                            patient. Return to normal activities tomorrow.                            Written discharge instructions were provided to the  patient.                           - Resume previous diet.                           - Continue present medications.                           - Resume Eliquis / Trental tomorrow                           - Patient will require EGD to be done at the                            hospital for APC ablation of GAVE to prevent                            recurrent anemia (APC not available at the office                            today), we will coordinate                           - Start oral carafate 1gm three times daily (this                            will make stools dark)                           - Close monitoring of Hgb, transfuse as needed Remo Lipps P. Armbruster, MD 11/11/2018 1:37:29 PM This report has been signed electronically.

## 2018-11-11 NOTE — Progress Notes (Signed)
History reviewed today  Temp and Covid 19 per [place]. [place]. VS per Lorrin Goodell

## 2018-11-15 ENCOUNTER — Inpatient Hospital Stay: Payer: Medicare Other

## 2018-11-15 ENCOUNTER — Telehealth: Payer: Self-pay

## 2018-11-15 ENCOUNTER — Other Ambulatory Visit: Payer: Self-pay

## 2018-11-15 VITALS — BP 156/73 | HR 66

## 2018-11-15 DIAGNOSIS — D631 Anemia in chronic kidney disease: Secondary | ICD-10-CM

## 2018-11-15 DIAGNOSIS — N183 Chronic kidney disease, stage 3 unspecified: Secondary | ICD-10-CM

## 2018-11-15 DIAGNOSIS — N189 Chronic kidney disease, unspecified: Secondary | ICD-10-CM

## 2018-11-15 DIAGNOSIS — D649 Anemia, unspecified: Secondary | ICD-10-CM

## 2018-11-15 LAB — SAMPLE TO BLOOD BANK

## 2018-11-15 LAB — CBC WITH DIFFERENTIAL (CANCER CENTER ONLY)
Abs Immature Granulocytes: 0.02 10*3/uL (ref 0.00–0.07)
Basophils Absolute: 0 10*3/uL (ref 0.0–0.1)
Basophils Relative: 0 %
Eosinophils Absolute: 0.3 10*3/uL (ref 0.0–0.5)
Eosinophils Relative: 3 %
HCT: 31.9 % — ABNORMAL LOW (ref 36.0–46.0)
Hemoglobin: 9.6 g/dL — ABNORMAL LOW (ref 12.0–15.0)
Immature Granulocytes: 0 %
Lymphocytes Relative: 5 %
Lymphs Abs: 0.4 10*3/uL — ABNORMAL LOW (ref 0.7–4.0)
MCH: 30.7 pg (ref 26.0–34.0)
MCHC: 30.1 g/dL (ref 30.0–36.0)
MCV: 101.9 fL — ABNORMAL HIGH (ref 80.0–100.0)
Monocytes Absolute: 0.5 10*3/uL (ref 0.1–1.0)
Monocytes Relative: 6 %
Neutro Abs: 6.8 10*3/uL (ref 1.7–7.7)
Neutrophils Relative %: 86 %
Platelet Count: 310 10*3/uL (ref 150–400)
RBC: 3.13 MIL/uL — ABNORMAL LOW (ref 3.87–5.11)
RDW: 14 % (ref 11.5–15.5)
WBC Count: 8 10*3/uL (ref 4.0–10.5)
nRBC: 0 % (ref 0.0–0.2)

## 2018-11-15 MED ORDER — DARBEPOETIN ALFA 200 MCG/0.4ML IJ SOSY
PREFILLED_SYRINGE | INTRAMUSCULAR | Status: AC
  Filled 2018-11-15: qty 0.4

## 2018-11-15 MED ORDER — DARBEPOETIN ALFA 200 MCG/0.4ML IJ SOSY
200.0000 ug | PREFILLED_SYRINGE | Freq: Once | INTRAMUSCULAR | Status: AC
  Administered 2018-11-15: 200 ug via SUBCUTANEOUS

## 2018-11-15 NOTE — Patient Instructions (Signed)

## 2018-11-15 NOTE — Telephone Encounter (Signed)
  Follow up Call-  Call back number 11/11/2018  Post procedure Call Back phone  # [phone]  Permission to leave phone message No  Some recent data might be hidden     Patient questions:  Do you have a fever, pain , or abdominal swelling? No. Pain Score  0 *  Have you tolerated food without any problems? Yes.    Have you been able to return to your normal activities? Yes.    Do you have any questions about your discharge instructions: Diet   No. Medications  No. Follow up visit  No.  Do you have questions or concerns about your Care? No.  Actions: * If pain score is 4 or above: No action needed, pain <4.  1. Have you developed a fever since your procedure? no  2.   Have you had an respiratory symptoms (SOB or cough) since your procedure? no  3.   Have you tested positive for COVID 19 since your procedure no  4.   Have you had any family members/close contacts diagnosed with the COVID 19 since your procedure?  no   2. If any of these questions are a yes, please inquire if patient has been seen by family doctor and route this note to Joylene John, Therapist, sports.Have you developed a fever since your procedure? no  2.   Have you had an respiratory symptoms (SOB or cough) since your procedure? no  3.   Have you tested positive for COVID 19 since your procedure no  4.   Have you had any family members/close contacts diagnosed with the COVID 19 since your procedure?  no   If any of these questions are a yes, please inquire if patient has been seen by family doctor and route this note to Joylene John, Therapist, sports.

## 2018-11-18 ENCOUNTER — Other Ambulatory Visit: Payer: Self-pay

## 2018-11-18 DIAGNOSIS — D649 Anemia, unspecified: Secondary | ICD-10-CM

## 2018-11-21 ENCOUNTER — Other Ambulatory Visit (HOSPITAL_COMMUNITY)
Admission: RE | Admit: 2018-11-21 | Discharge: 2018-11-21 | Disposition: A | Discharge status: Home / Self Care | Payer: Medicare Other | Source: Ambulatory Visit | Attending: Orthopaedic Surgery | Admitting: Orthopaedic Surgery

## 2018-11-21 ENCOUNTER — Other Ambulatory Visit: Payer: Self-pay

## 2018-11-21 DIAGNOSIS — Z1159 Encounter for screening for other viral diseases: Secondary | ICD-10-CM | POA: Insufficient documentation

## 2018-11-22 ENCOUNTER — Encounter (HOSPITAL_COMMUNITY): Payer: Self-pay | Admitting: *Deleted

## 2018-11-22 ENCOUNTER — Other Ambulatory Visit: Payer: Self-pay

## 2018-11-22 ENCOUNTER — Ambulatory Visit: Payer: Medicare Other

## 2018-11-22 ENCOUNTER — Ambulatory Visit: Payer: Medicare Other | Admitting: Nurse Practitioner

## 2018-11-22 ENCOUNTER — Other Ambulatory Visit: Payer: Medicare Other

## 2018-11-23 ENCOUNTER — Ambulatory Visit (INDEPENDENT_AMBULATORY_CARE_PROVIDER_SITE_OTHER): Payer: Medicare Other | Admitting: Orthopaedic Surgery

## 2018-11-23 ENCOUNTER — Encounter: Payer: Self-pay | Admitting: Orthopaedic Surgery

## 2018-11-23 VITALS — Ht <= 58 in | Wt 229.0 lb

## 2018-11-23 DIAGNOSIS — M87052 Idiopathic aseptic necrosis of left femur: Secondary | ICD-10-CM | POA: Diagnosis not present

## 2018-11-23 LAB — NOVEL CORONAVIRUS, NAA (HOSP ORDER, SEND-OUT TO REF LAB; TAT 18-24 HRS): SARS-CoV-2, NAA: NOT DETECTED

## 2018-11-23 NOTE — Progress Notes (Addendum)
The patient is a 78 year old female that I have seen before this year.  She was referred to me with debilitating left hip pain and known avascular necrosis of the left hip.  She is morbidly obese with a BMI of 48.  She is diabetic but reports good control.  She is on Eliquis due to having A. fib and a pacemaker.  I wanted to see her back in follow-up in the office today to see if she had lost weight since I seen her last and to see if I can even hit her on the exam table to examine her left hip as well as determine if anatomically I get to the left hip through a direct anterior approach which is a surgery that I performed for hips.  She was unable to get up on exam table in her room so we took her to the radiology section to put her on at least the radiology table which was very uncomfortable for her.  I had a difficult time mobilizing her abdomen.  Her body folds and pannus hangs significantly over her groin area and anterior thigh which would significantly increase her risk for soft tissue complications with the surgery.  Her thigh is a decent amount of adipose tissue as well.  Based on her comorbidities combined with her morbid obesity and the fact that I do not feel that I can get to her left hip safely I had to explain to her in detail that I was not comfortable with performing a direct anterior hip replacement surgery on her at this moment.  I do feel that she would benefit from a power mobility device.  I examined her extensively today in terms of her upper and lower extremities to get his good an idea of if she would benefit from a power mobility device.  We are able to print out information from [facility] and a more thorough dictated note with an addendum will be forthcoming in the hopes of getting her to qualify for a power mobility device.  The patient has the inability to ambulate any distance at all given her severe left hip pain and avascular necrosis of the left hip.  She is also morbidly obese with  a BMI of 44.  She is a significant fall risk.  Her comorbidities include chronic kidney disease as well as diabetes and heart disease.  She is on blood thinning medication.  She has moderate strength in her bilateral upper extremities which certainly can help with using a power mobility device but her limitations in strength with only being 2-3 out of 5 of her upper extremities keep her from being able to use a manual wheelchair.  Also given her limitations from her heart disease and her deconditioning with shortness of breath, a power mobility device would meet her needs more.  She cannot use a cane or walker due to the fact that she really cannot walk any distance at all.  Given her pain I am concerned that a scooter is not appropriate for her because of her postural stability given her weight as well.  She definitely is in need of a power mobility device and I believe she needs this for the foreseeable future and likely for lifetime.  She is basically almost essentially nonambulatory.  She is not a surgical candidate given her multiple comorbidities combined with her morbid obesity.  I am deftly advocating her having a power mobility device they can help her have at least better quality of life  and improved mobility.  A power mobility device is necessary for her to mobilize even in her home with getting to the bathroom, the bedroom and the kitchen to perform activities of daily living.

## 2018-11-24 ENCOUNTER — Ambulatory Visit (HOSPITAL_COMMUNITY)
Admission: RE | Admit: 2018-11-24 | Discharge: 2018-11-24 | Disposition: A | Discharge status: Home / Self Care | Payer: Medicare Other | Attending: Gastroenterology | Admitting: Gastroenterology

## 2018-11-24 ENCOUNTER — Ambulatory Visit (HOSPITAL_COMMUNITY): Payer: Medicare Other | Admitting: Certified Registered Nurse Anesthetist

## 2018-11-24 ENCOUNTER — Encounter (HOSPITAL_COMMUNITY)
Admission: RE | Disposition: A | Discharge status: Home / Self Care | Payer: Self-pay | Source: Home / Self Care | Attending: Gastroenterology

## 2018-11-24 ENCOUNTER — Encounter (HOSPITAL_COMMUNITY): Payer: Self-pay | Admitting: *Deleted

## 2018-11-24 DIAGNOSIS — Z6841 Body Mass Index (BMI) 40.0 and over, adult: Secondary | ICD-10-CM | POA: Diagnosis not present

## 2018-11-24 DIAGNOSIS — I129 Hypertensive chronic kidney disease with stage 1 through stage 4 chronic kidney disease, or unspecified chronic kidney disease: Secondary | ICD-10-CM | POA: Insufficient documentation

## 2018-11-24 DIAGNOSIS — M549 Dorsalgia, unspecified: Secondary | ICD-10-CM | POA: Diagnosis not present

## 2018-11-24 DIAGNOSIS — M1990 Unspecified osteoarthritis, unspecified site: Secondary | ICD-10-CM | POA: Diagnosis not present

## 2018-11-24 DIAGNOSIS — E785 Hyperlipidemia, unspecified: Secondary | ICD-10-CM | POA: Insufficient documentation

## 2018-11-24 DIAGNOSIS — G8929 Other chronic pain: Secondary | ICD-10-CM | POA: Diagnosis not present

## 2018-11-24 DIAGNOSIS — E669 Obesity, unspecified: Secondary | ICD-10-CM | POA: Insufficient documentation

## 2018-11-24 DIAGNOSIS — F419 Anxiety disorder, unspecified: Secondary | ICD-10-CM | POA: Insufficient documentation

## 2018-11-24 DIAGNOSIS — K31811 Angiodysplasia of stomach and duodenum with bleeding: Secondary | ICD-10-CM | POA: Diagnosis not present

## 2018-11-24 DIAGNOSIS — Z950 Presence of cardiac pacemaker: Secondary | ICD-10-CM | POA: Diagnosis not present

## 2018-11-24 DIAGNOSIS — K219 Gastro-esophageal reflux disease without esophagitis: Secondary | ICD-10-CM | POA: Insufficient documentation

## 2018-11-24 DIAGNOSIS — Z9049 Acquired absence of other specified parts of digestive tract: Secondary | ICD-10-CM | POA: Insufficient documentation

## 2018-11-24 DIAGNOSIS — I4891 Unspecified atrial fibrillation: Secondary | ICD-10-CM | POA: Insufficient documentation

## 2018-11-24 DIAGNOSIS — N183 Chronic kidney disease, stage 3 (moderate): Secondary | ICD-10-CM | POA: Diagnosis not present

## 2018-11-24 DIAGNOSIS — Z885 Allergy status to narcotic agent status: Secondary | ICD-10-CM | POA: Insufficient documentation

## 2018-11-24 DIAGNOSIS — F329 Major depressive disorder, single episode, unspecified: Secondary | ICD-10-CM | POA: Diagnosis not present

## 2018-11-24 DIAGNOSIS — K449 Diaphragmatic hernia without obstruction or gangrene: Secondary | ICD-10-CM | POA: Diagnosis not present

## 2018-11-24 DIAGNOSIS — D631 Anemia in chronic kidney disease: Secondary | ICD-10-CM | POA: Insufficient documentation

## 2018-11-24 DIAGNOSIS — M810 Age-related osteoporosis without current pathological fracture: Secondary | ICD-10-CM | POA: Insufficient documentation

## 2018-11-24 DIAGNOSIS — H409 Unspecified glaucoma: Secondary | ICD-10-CM | POA: Diagnosis not present

## 2018-11-24 DIAGNOSIS — K5790 Diverticulosis of intestine, part unspecified, without perforation or abscess without bleeding: Secondary | ICD-10-CM | POA: Insufficient documentation

## 2018-11-24 DIAGNOSIS — Z7901 Long term (current) use of anticoagulants: Secondary | ICD-10-CM | POA: Insufficient documentation

## 2018-11-24 DIAGNOSIS — M109 Gout, unspecified: Secondary | ICD-10-CM | POA: Diagnosis not present

## 2018-11-24 DIAGNOSIS — E079 Disorder of thyroid, unspecified: Secondary | ICD-10-CM | POA: Insufficient documentation

## 2018-11-24 DIAGNOSIS — E559 Vitamin D deficiency, unspecified: Secondary | ICD-10-CM | POA: Diagnosis not present

## 2018-11-24 DIAGNOSIS — Z87442 Personal history of urinary calculi: Secondary | ICD-10-CM | POA: Diagnosis not present

## 2018-11-24 DIAGNOSIS — M3500 Sicca syndrome, unspecified: Secondary | ICD-10-CM | POA: Diagnosis not present

## 2018-11-24 DIAGNOSIS — Z87891 Personal history of nicotine dependence: Secondary | ICD-10-CM | POA: Insufficient documentation

## 2018-11-24 DIAGNOSIS — Z90710 Acquired absence of both cervix and uterus: Secondary | ICD-10-CM | POA: Insufficient documentation

## 2018-11-24 DIAGNOSIS — Z96653 Presence of artificial knee joint, bilateral: Secondary | ICD-10-CM | POA: Insufficient documentation

## 2018-11-24 DIAGNOSIS — E1122 Type 2 diabetes mellitus with diabetic chronic kidney disease: Secondary | ICD-10-CM | POA: Insufficient documentation

## 2018-11-24 DIAGNOSIS — D649 Anemia, unspecified: Secondary | ICD-10-CM

## 2018-11-24 DIAGNOSIS — Z8249 Family history of ischemic heart disease and other diseases of the circulatory system: Secondary | ICD-10-CM | POA: Insufficient documentation

## 2018-11-24 DIAGNOSIS — Z833 Family history of diabetes mellitus: Secondary | ICD-10-CM | POA: Insufficient documentation

## 2018-11-24 DIAGNOSIS — K31819 Angiodysplasia of stomach and duodenum without bleeding: Secondary | ICD-10-CM

## 2018-11-24 HISTORY — DX: Personal history of urinary calculi: Z87.442

## 2018-11-24 HISTORY — PX: ESOPHAGOGASTRODUODENOSCOPY: SHX5428

## 2018-11-24 HISTORY — DX: Idiopathic aseptic necrosis of unspecified bone: M87.00

## 2018-11-24 HISTORY — PX: HOT HEMOSTASIS: SHX5433

## 2018-11-24 LAB — HEMOGLOBIN: Hemoglobin: 9.7 g/dL — ABNORMAL LOW (ref 12.0–15.0)

## 2018-11-24 LAB — GLUCOSE, CAPILLARY: Glucose-Capillary: 138 mg/dL — ABNORMAL HIGH (ref 70–99)

## 2018-11-24 SURGERY — EGD (ESOPHAGOGASTRODUODENOSCOPY)
Anesthesia: Monitor Anesthesia Care

## 2018-11-24 MED ORDER — LACTATED RINGERS IV SOLN
INTRAVENOUS | Status: DC
  Administered 2018-11-24: 13:00:00 via INTRAVENOUS

## 2018-11-24 MED ORDER — PROPOFOL 500 MG/50ML IV EMUL
INTRAVENOUS | Status: DC | PRN
  Administered 2018-11-24: 100 ug/kg/min via INTRAVENOUS

## 2018-11-24 MED ORDER — LIDOCAINE 2% (20 MG/ML) 5 ML SYRINGE
INTRAMUSCULAR | Status: DC | PRN
  Administered 2018-11-24: 100 mg via INTRAVENOUS

## 2018-11-24 MED ORDER — PROPOFOL 10 MG/ML IV BOLUS
INTRAVENOUS | Status: DC | PRN
  Administered 2018-11-24: 20 mg via INTRAVENOUS

## 2018-11-24 MED ORDER — SODIUM CHLORIDE 0.9 % IV SOLN: INTRAVENOUS | Status: DC

## 2018-11-24 NOTE — Discharge Instructions (Signed)
YOU HAD AN ENDOSCOPIC PROCEDURE TODAY: Refer to the procedure report and other information in the discharge instructions given to you for any specific questions about what was found during the examination. If this information does not answer your questions, please call [place] office at [phone] to clarify.   YOU SHOULD EXPECT: Some feelings of bloating in the abdomen. Passage of more gas than usual. Walking can help get rid of the air that was put into your GI tract during the procedure and reduce the bloating.   DIET: Your first meal following the procedure should be a light meal and then it is ok to progress to your normal diet. A half-sandwich or bowl of soup is an example of a good first meal. Heavy or fried foods are harder to digest and may make you feel nauseous or bloated. Drink plenty of fluids but you should avoid alcoholic beverages for 24 hours.  ACTIVITY: Your care partner should take you home directly after the procedure. You should plan to take it easy, moving slowly for the rest of the day. You can resume normal activity the day after the procedure however YOU SHOULD NOT DRIVE, use power tools, machinery or perform tasks that involve climbing or major physical exertion for 24 hours (because of the sedation medicines used during the test).   SYMPTOMS TO REPORT IMMEDIATELY: A gastroenterologist can be reached at any hour. Please call [phone]  for any of the following symptoms:   Following upper endoscopy (EGD, EUS, ERCP, esophageal dilation) Vomiting of blood or coffee ground material  New, significant abdominal pain  New, significant chest pain or pain under the shoulder blades  Painful or persistently difficult swallowing  New shortness of breath  Black, tarry-looking or red, bloody stools  FOLLOW UP:  If any biopsies were taken you will be contacted by phone or by letter within the next 1-3 weeks. Call [phone]  if you have not heard about the biopsies in 3 weeks.    Please also call with any specific questions about appointments or follow up tests.

## 2018-11-24 NOTE — Op Note (Signed)
[facility] Patient Name: Tricia Gonzales Procedure Date: 11/24/2018 MRN: [id number] Attending MD: Carlota Raspberry. 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 Havery [name], MD, Cleda Daub, RN, Cherylynn Ridges, Technician Referring MD:              Medicines:                Monitored Anesthesia Care Complications:            No immediate complications. Estimated blood loss:                            Minimal. Estimated Blood Loss:     Estimated blood loss was minimal. Procedure:                Pre-Anesthesia Assessment:                           - Prior to the procedure, a History and Physical                            was performed, and patient medications and                            allergies were reviewed. The patient's tolerance of                            previous anesthesia was also reviewed. The risks                            and benefits of the procedure and the sedation                            options and risks were discussed with the patient.                            All questions were answered, and informed consent                            was obtained. Prior Anticoagulants: The patient has  taken Eliquis (apixaban), last dose was day of                            procedure. After reviewing the risks and benefits,                            the patient was deemed in satisfactory condition to                            undergo the procedure.                           After obtaining informed consent, the endoscope was                            passed under direct vision. Throughout the                   procedure, the patient's blood pressure, pulse, and                            oxygen saturations were monitored continuously. The                            GIF-H190 ([id number]) Olympus gastroscope was                            introduced through the mouth, and advanced to the                            second part of duodenum. The upper GI endoscopy was                            accomplished without difficulty. The patient                            tolerated the procedure well. 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 Fulguration to ablate the lesions to prevent       bleeding by argon plasma was successful.      The exam of the stomach was otherwise normal.      The duodenal bulb and second portion of the duodenum were normal. Impression:               - Normal esophagus.                           - Severe gastric antral vascular ectasia with                            active oozing in the setting of Eliquis. Treated  with argon plasma coagulation (APC).                           - Normal duodenal bulb and second portion of the                            duodenum. Moderate Sedation:      No moderate sedation, case performed with MAC Recommendation:           - Patient has a contact number available for                            emergencies. The signs and symptoms of potential                            delayed complications were discussed with the                            patient. Return to normal activities tomorrow.                            Written discharge instructions were provided to the                            patient.                           - Clear liquid diet now, advance to soft tomorrow                           - Continue present medications.                            - Hold Eliquis for 48 hours given oozing noted on                            today's exam                           - Start carafate 1gm q 6 hours                           - Repeat CBC to ensure Hgb is stable Procedure Code(s):        --- Professional ---                           [phone], Esophagogastroduodenoscopy, flexible,                            transoral; with control of bleeding, any method Diagnosis Code(s):        --- Professional ---                           I01.655, Angiodysplasia of stomach and duodenum  with bleeding CPT copyright 2019 American Medical Association. All rights reserved. The codes documented in this report are preliminary and upon coder review may  be revised to meet current compliance requirements. Remo Lipps P. Amorette Charrette, MD 11/24/2018 2:31:53 PM This report has been signed electronically. Number of Addenda: 0

## 2018-11-24 NOTE — Transfer of Care (Signed)
Immediate Anesthesia Transfer of Care Note  Patient: Tricia Gonzales  Procedure(s) Performed: ESOPHAGOGASTRODUODENOSCOPY (EGD) (N/A ) HOT HEMOSTASIS (ARGON PLASMA COAGULATION/BICAP) (N/A )  Patient Location: PACU and Endoscopy Unit  Anesthesia Type:MAC  Level of Consciousness: awake, alert  and oriented  Airway & Oxygen Therapy: Patient Spontanous Breathing and Patient connected to nasal cannula oxygen  Post-op Assessment: Report given to RN and Post -op Vital signs reviewed and stable  Post vital signs: Reviewed and stable  Last Vitals:  Vitals Value Taken Time  BP    Temp    Pulse    Resp    SpO2      Last Pain:  Vitals:   11/24/18 1248  TempSrc: Oral         Complications: No apparent anesthesia complications

## 2018-11-24 NOTE — H&P (Signed)
HPI :  78 y/o female on eliquis with worsening anemia leading to blood transfusions. Recent EGD showed GAVE. At the hospital today for EGD with APC ablation.  She is continuing Eliquis.  Past Medical History:  Diagnosis Date  . Anemia   . 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 Anxiety   . Arthritis   . Atrial fibrillation ([place])   . Avascular necrosis (HCC) hip left   leg pain also  . Chronic back pain   . Chronic kidney disease (CKD), stage III (moderate) (HCC)   . Depression   . Diverticulosis   . Early cataracts, bilateral   . Esophageal stricture   . Gastritis   . GERD (gastroesophageal reflux disease)   . Glaucoma   . Heart murmur     " some doctors say that I have one some say that I don"t "  . Hiatal hernia   . History of blood transfusion    "@ least w/1st knee OR"  . History of gout   . History of kidney stones   . Hyperlipidemia   . Hypertension   . Intestinal obstruction ([place])   . Kidney stones   . Obesity   . Osteoarthritis   . Osteoporosis   . Pericarditis 2016  . Presence of permanent cardiac pacemaker   . Sjogren's disease ([place])   . Thyroid disease   . Type II diabetes mellitus ([place])   . Vitamin B12 deficiency      Past Surgical History:  Procedure Laterality Date  . ABDOMINAL HYSTERECTOMY     complete  . CHOLECYSTECTOMY OPEN    . COLECTOMY     for rectovaginal fistula  . COLONOSCOPY  01/07/2012   Procedure: COLONOSCOPY;  Surgeon: Lafayette Dragon, MD;  Location: [facility];  Service: Endoscopy;  Laterality: N/A;  . EP IMPLANTABLE DEVICE N/A 06/18/2015   Procedure: Pacemaker Implant;  Surgeon: Evans Lance, MD;  Location: [facility];  Service: Cardiovascular;  Laterality: N/A;  . ESOPHAGOGASTRODUODENOSCOPY (EGD) WITH ESOPHAGEAL DILATION    . ESOPHAGOGASTRODUODENOSCOPY (EGD) WITH PROPOFOL N/A 08/12/2015   Procedure: ESOPHAGOGASTRODUODENOSCOPY (EGD) WITH PROPOFOL;  Surgeon: Manus Gunning, MD;  Location: [facility];   Service: Gastroenterology;  Laterality: N/A;  . EYE SURGERY Bilateral    cataracts  . I&D EXTREMITY Right 08/06/2016   Procedure: DEBRIDEMENT PIP RIGHT RING FINGER;  Surgeon: Daryll Brod, MD;  Location: [facility];  Service: Orthopedics;  Laterality: Right;  . INSERT / REPLACE / REMOVE PACEMAKER    . JOINT REPLACEMENT    . KNEE ARTHROSCOPY Right   . MASS EXCISION Right 08/06/2016   Procedure: EXCISION CYST;  Surgeon: Daryll Brod, MD;  Location: [facility];  Service: Orthopedics;  Laterality: Right;  . REVISION TOTAL KNEE ARTHROPLASTY Left   . TOTAL KNEE ARTHROPLASTY Bilateral    Family History  Problem Relation Age of Onset  . Diabetes Mother   . Diabetes Brother   . Hypertension Sister   . Colon cancer Neg Hx   . Heart attack Neg Hx   . Stroke Neg Hx   . Colon polyps Neg Hx   . Esophageal cancer Neg Hx   . Rectal cancer Neg Hx   . Stomach cancer Neg Hx    Social History   Tobacco Use  . Smoking status: Former Smoker    Packs/day: 2.00    Years: 3.00    Pack years: 6.00    Types: Cigarettes    Last attempt to quit:  06/23/1983    Years since quitting: 35.4  . Smokeless tobacco: Never Used  Substance Use Topics  . Alcohol use: Yes    Alcohol/week: 2.0 standard drinks    Types: 2 Glasses of wine per week    Comment: occ  . Drug use: No   Current Facility-Administered Medications  Medication Dose Route Frequency Provider Last Rate Last Dose  . lactated ringers infusion   Intravenous Continuous Parisha Beaulac, Carlota Raspberry, MD 20 mL/hr at 11/24/18 1253     Allergies  Allergen Reactions  . Norvasc [Amlodipine Besylate] Shortness Of Breath  . Avapro [Irbesartan]     Headaches  . Glucophage [Metformin Hydrochloride] Other (See Comments)    "increases creatinine"  . Lisinopril     Headache   . Losartan     Headache   . Morphine And Related Other (See Comments)    "hallucinations"  . Simvastatin Other (See Comments)    Body aches  . Trulicity [Dulaglutide] Other (See Comments)     Severe mood swings     Review of Systems: All systems reviewed and negative except where noted in HPI.    No results found.  Physical Exam: BP (!) 174/63   Pulse 70   Temp 97.7 F (36.5 C) (Oral)   Resp 12   Ht 4' 10" (1.473 m)   Wt 103.9 kg   SpO2 97%   BMI 47.86 kg/m  Constitutional: Pleasant,well-developed, female in no acute distress. HEENT: Normocephalic and atraumatic. Conjunctivae are normal. No scleral icterus. Neck supple.  Cardiovascular: Normal rate, regular rhythm.  Pulmonary/chest: Effort normal and breath sounds normal.  Abdominal: Soft, nondistended, nontender.   ASSESSMENT AND PLAN: 78 y/o female with multiple medical problems on Eliquis, here for EGD for APC ablation of GAVE. Discussed risks / benefits she wishes to proceed. APC is a low risk for bleeding, she has continued Eliquis for this exam.  [place] Cellar, MD [facility]    No ref. provider found

## 2018-11-24 NOTE — Anesthesia Postprocedure Evaluation (Signed)
Anesthesia Post Note  Patient: Tricia Gonzales  Procedure(s) Performed: ESOPHAGOGASTRODUODENOSCOPY (EGD) (N/A ) HOT HEMOSTASIS (ARGON PLASMA COAGULATION/BICAP) (N/A )     Patient location during evaluation: Endoscopy Anesthesia Type: MAC Level of consciousness: awake and alert Pain management: pain level controlled Vital Signs Assessment: post-procedure vital signs reviewed and stable Respiratory status: spontaneous breathing, nonlabored ventilation, respiratory function stable and patient connected to nasal cannula oxygen Cardiovascular status: stable and blood pressure returned to baseline Postop Assessment: no apparent nausea or vomiting Anesthetic complications: no    Last Vitals:  Vitals:   11/24/18 1440 11/24/18 1450  BP: (!) 200/64 (!) 193/78  Pulse: 83 74  Resp: 20 13  Temp:    SpO2: 96% 98%    Last Pain:  Vitals:   11/24/18 1450  TempSrc:   PainSc: 0-No pain                 Brissa Asante

## 2018-11-24 NOTE — Anesthesia Preprocedure Evaluation (Signed)
Anesthesia Evaluation  Patient identified by MRN, date of birth, ID band Patient awake    Reviewed: Allergy & Precautions, NPO status , Patient's Chart, lab work & pertinent test results  History of Anesthesia Complications Negative for: history of anesthetic complications  Airway Mallampati: III  TM Distance: >3 FB Neck ROM: Full    Dental  (+) Dental Advisory Given   Pulmonary neg shortness of breath, neg COPD, neg recent URI, former smoker,    breath sounds clear to auscultation       Cardiovascular hypertension, Pt. on medications + dysrhythmias Atrial Fibrillation + pacemaker  Rhythm:Regular     Neuro/Psych PSYCHIATRIC DISORDERS Anxiety Depression  Neuromuscular disease    GI/Hepatic Neg liver ROS, hiatal hernia, GERD  ,  Endo/Other  diabetesMorbid obesity  Renal/GU CRFRenal disease     Musculoskeletal  (+) Arthritis ,   Abdominal   Peds  Hematology  (+) Blood dyscrasia, anemia ,   Anesthesia Other Findings Left ventricle: The cavity size was normal. Wall thickness was   normal. Systolic function was normal. The estimated ejection   fraction was in the range of 55% to 60%. Wall motion was normal;   there were no regional wall motion abnormalities. - Mitral valve: Calcified annulus. There was mild regurgitation. - Right ventricle: The cavity size was moderately dilated. Wall   thickness was normal. Pacer wire or catheter noted in right   ventricle. - Right atrium: The atrium was moderately dilated.  Reproductive/Obstetrics                            Anesthesia Physical Anesthesia Plan  ASA: III  Anesthesia Plan: MAC   Post-op Pain Management:    Induction: Intravenous  PONV Risk Score and Plan: 2 and Treatment may vary due to age or medical condition and Propofol infusion  Airway Management Planned: Nasal Cannula  Additional Equipment: None  Intra-op Plan:    Post-operative Plan:   Informed Consent: I have reviewed the patients History and Physical, chart, labs and discussed the procedure including the risks, benefits and alternatives for the proposed anesthesia with the patient or authorized representative who has indicated his/her understanding and acceptance.     Dental advisory given  Plan Discussed with: CRNA and Surgeon  Anesthesia Plan Comments:         Anesthesia Quick Evaluation

## 2018-11-24 NOTE — Interval H&P Note (Signed)
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  The various methods of treatment have been discussed with the patient and family. After consideration of risks, benefits and other options for treatment, the patient has consented to  Procedure(s): ESOPHAGOGASTRODUODENOSCOPY (EGD) (N/A) HOT HEMOSTASIS (ARGON PLASMA COAGULATION/BICAP) (N/A) as a surgical intervention.  The patient's history has been reviewed, patient examined, no change in status, stable for surgery.  I have reviewed the patient's chart and labs.  Questions were answered to the patient's satisfaction.     [place]

## 2018-11-25 ENCOUNTER — Encounter (HOSPITAL_COMMUNITY): Payer: Self-pay | Admitting: Gastroenterology

## 2018-11-28 ENCOUNTER — Telehealth: Payer: Self-pay

## 2018-11-28 ENCOUNTER — Telehealth: Payer: Self-pay | Admitting: Internal Medicine

## 2018-11-28 NOTE — Telephone Encounter (Signed)
Returned call to Pt.  Advised this nurse had reviewed her records and seen she had just had GI surgery for bleeding.  Pt is wondering if her Eliquis dose can be decreased or if she even needs to continue it.  Advised Pt would discuss with GT next week and call her back.  Pt ok with that.

## 2018-11-28 NOTE — Telephone Encounter (Signed)
-----  Message from Yetta Flock, MD sent at 11/26/2018  8:08 AM EDT ----- Regarding: repeat labs Tricia Laboy MS. Schuessler needs a CBC in 2 weeks if you can help coordinate for her and let her know her most recent Hgb was stable during date of her EGD. Thanks much

## 2018-11-28 NOTE — Telephone Encounter (Signed)
Called patient and gave Hgb results. Asked patient to come in for CBC in two weeks and she said the [place] is having her get that done every 2 weeks. I look I Epic and that seems true. OK to not have her get it done here also ?

## 2018-11-28 NOTE — Telephone Encounter (Signed)
Patient called about her Eliquis she states that she has bleeding in her stomach and she wants to know if she can reduce it to 2.56m instead of 564mthat she is currently taking.

## 2018-11-29 ENCOUNTER — Other Ambulatory Visit: Payer: Self-pay

## 2018-11-29 ENCOUNTER — Telehealth: Payer: Self-pay

## 2018-11-29 ENCOUNTER — Inpatient Hospital Stay (HOSPITAL_BASED_OUTPATIENT_CLINIC_OR_DEPARTMENT_OTHER): Payer: Medicare Other | Admitting: Nurse Practitioner

## 2018-11-29 ENCOUNTER — Inpatient Hospital Stay: Payer: Medicare Other

## 2018-11-29 ENCOUNTER — Other Ambulatory Visit: Payer: Self-pay | Admitting: *Deleted

## 2018-11-29 ENCOUNTER — Encounter: Payer: Self-pay | Admitting: Nurse Practitioner

## 2018-11-29 ENCOUNTER — Inpatient Hospital Stay: Payer: Medicare Other | Attending: Oncology

## 2018-11-29 VITALS — BP 145/51 | HR 76 | Temp 97.9°F | Resp 18 | Ht <= 58 in | Wt 213.8 lb

## 2018-11-29 DIAGNOSIS — N183 Chronic kidney disease, stage 3 unspecified: Secondary | ICD-10-CM

## 2018-11-29 DIAGNOSIS — Z7901 Long term (current) use of anticoagulants: Secondary | ICD-10-CM

## 2018-11-29 DIAGNOSIS — I4891 Unspecified atrial fibrillation: Secondary | ICD-10-CM | POA: Insufficient documentation

## 2018-11-29 DIAGNOSIS — E1122 Type 2 diabetes mellitus with diabetic chronic kidney disease: Secondary | ICD-10-CM | POA: Diagnosis present

## 2018-11-29 DIAGNOSIS — M3500 Sicca syndrome, unspecified: Secondary | ICD-10-CM

## 2018-11-29 DIAGNOSIS — D631 Anemia in chronic kidney disease: Secondary | ICD-10-CM

## 2018-11-29 DIAGNOSIS — D122 Benign neoplasm of ascending colon: Secondary | ICD-10-CM

## 2018-11-29 DIAGNOSIS — K31819 Angiodysplasia of stomach and duodenum without bleeding: Secondary | ICD-10-CM

## 2018-11-29 DIAGNOSIS — N189 Chronic kidney disease, unspecified: Secondary | ICD-10-CM

## 2018-11-29 DIAGNOSIS — M1990 Unspecified osteoarthritis, unspecified site: Secondary | ICD-10-CM | POA: Insufficient documentation

## 2018-11-29 DIAGNOSIS — Z79899 Other long term (current) drug therapy: Secondary | ICD-10-CM | POA: Insufficient documentation

## 2018-11-29 DIAGNOSIS — M25559 Pain in unspecified hip: Secondary | ICD-10-CM | POA: Diagnosis not present

## 2018-11-29 LAB — CBC WITH DIFFERENTIAL (CANCER CENTER ONLY)
Abs Immature Granulocytes: 0.04 10*3/uL (ref 0.00–0.07)
Basophils Absolute: 0 10*3/uL (ref 0.0–0.1)
Basophils Relative: 0 %
Eosinophils Absolute: 0.1 10*3/uL (ref 0.0–0.5)
Eosinophils Relative: 1 %
HCT: 34.6 % — ABNORMAL LOW (ref 36.0–46.0)
Hemoglobin: 10.1 g/dL — ABNORMAL LOW (ref 12.0–15.0)
Immature Granulocytes: 0 %
Lymphocytes Relative: 4 %
Lymphs Abs: 0.5 10*3/uL — ABNORMAL LOW (ref 0.7–4.0)
MCH: 29.5 pg (ref 26.0–34.0)
MCHC: 29.2 g/dL — ABNORMAL LOW (ref 30.0–36.0)
MCV: 101.2 fL — ABNORMAL HIGH (ref 80.0–100.0)
Monocytes Absolute: 0.5 10*3/uL (ref 0.1–1.0)
Monocytes Relative: 5 %
Neutro Abs: 9.3 10*3/uL — ABNORMAL HIGH (ref 1.7–7.7)
Neutrophils Relative %: 90 %
Platelet Count: 343 10*3/uL (ref 150–400)
RBC: 3.42 MIL/uL — ABNORMAL LOW (ref 3.87–5.11)
RDW: 14.6 % (ref 11.5–15.5)
WBC Count: 10.4 10*3/uL (ref 4.0–10.5)
nRBC: 0 % (ref 0.0–0.2)

## 2018-11-29 MED ORDER — DARBEPOETIN ALFA 200 MCG/0.4ML IJ SOSY
200.0000 ug | PREFILLED_SYRINGE | Freq: Once | INTRAMUSCULAR | Status: AC
  Administered 2018-11-29: 200 ug via SUBCUTANEOUS

## 2018-11-29 MED ORDER — DARBEPOETIN ALFA 200 MCG/0.4ML IJ SOSY
PREFILLED_SYRINGE | INTRAMUSCULAR | Status: AC
  Filled 2018-11-29: qty 0.4

## 2018-11-29 NOTE — Telephone Encounter (Signed)
That's fine to have her labs there, I just need to be informed of them, if she can have her provider route to me, I believe it is Dr. Learta Codding.

## 2018-11-29 NOTE — Telephone Encounter (Signed)
Per Lattie Haw contacted lab tech to see if there was a way to put in a note for lab to send lab results every 2 weeks to Dr Havery [name] [phone]) I was told by lab tech to put in a comment under snapshot so that lab can see this request in the chart on epic. Comment inserted under specialty comments titled *ATTENTION LAB

## 2018-11-29 NOTE — Progress Notes (Addendum)
[place] OFFICE PROGRESS NOTE   Diagnosis: Anemia, renal insufficiency  INTERVAL HISTORY:   Tricia Gonzales returns as scheduled.  She continues every 2-week Aranesp.  She was transfused a unit of blood on 10/25/2018.  She denies bleeding.  No shortness of breath.  She reports intentional weight loss by eating smaller portions and decreasing intake of carbohydrates.  She continues to have back/hip pain.  Objective:  Vital signs in last 24 hours:  Blood pressure (!) 145/51, pulse 76, temperature 97.9 F (36.6 C), temperature source Oral, resp. rate 18, height _0  (1.473 m), weight 213 lb 12.8 oz (97 kg), SpO2 98 %.    Vascular: No leg edema.  Skin: Large ecchymosis right forearm.   Lab Results:  Lab Results  Component Value Date   WBC 10.4 11/29/2018   HGB 10.1 (L) 11/29/2018   HCT 34.6 (L) 11/29/2018   MCV 101.2 (H) 11/29/2018   PLT 343 11/29/2018   NEUTROABS 9.3 (H) 11/29/2018    Imaging:  No results found.  Medications: I have reviewed the patient's current medications.  Assessment/Plan: 1. 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; quantitative immunoglobulins show IgG mildly decreased 654 (range [phone]), IgA and IgM in normal range; polyclonal serum light chain elevation  11/15/2015-hemoglobin 11.2  11/29/2015-hemoglobin 11.1  12/12/2005-hemoglobin 10.4; Aranesp 100 g every 2 weeks initiated  02/07/2016-hemoglobin 11.0, Aranesp held, changed to an every 3 week schedule  Progressive anemia and decreased iron stores  December 2017, treated with IV iron January 2018 with improvement  She did not require Aranesp for 2 months and resumed Aranesp on a monthly schedule for 08/2016  Aranesp every 6 weeks beginning 05/04/2017  Progressive anemia and decreased iron stores 07/05/2017; IV irongiven2/11/2017 and 08/06/2017  Persistent severe anemia, Aranesp change to every 2 weeks beginning 10/19/2017  Persistent anemia, decreased iron stores 11/30/2017  IV iron 12/13/2017, 12/20/2017,  Decreased iron stores 05/17/2018  IV iron1/08/2018  07/05/2018-no serum M spike, polyclonal serum light chain elevation, IgG mildly decreased with IgA and IgM within normal range  2 units of blood 08/17/2018  IV iron 08/17/2018 and 08/24/2018  Stool heme +08/30/2018, referred to GI  2 units of blood 09/01/2018  1 unit of blood 10/25/2018  11/11/2018 colonoscopy- diverticulosis in the entire examined colon.  End-to-end colocolonic anastomosis.  One 5 mm polyp in the ascending colon.  No cause for anemia.  11/11/2018 upper endoscopy- evidence of gastric antral vascular ectasia which is the likely cause of the patient's worsening anemia.  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  2. Chronic renal insufficiency  3. Atrial fibrillation-maintained on apixaban  4. Osteoarthritis  5. Diabetes  6. Sjogren's disease  7. 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  She underwent APC last week.  She was last transfused about a month ago.  Hemoglobin has been stable over the past several weeks.  Plan to continue Aranesp every 2 weeks for now.  If the hemoglobin continues to  improve consideration will  be given to monitoring off of Aranesp.  We will see her back in 8 weeks.  She will contact the office in the interim with any problems.  Patient seen with Dr. Benay Spice.   Ned Card ANP/GNP-BC   11/29/2018  12:53 PM  This was a shared visit with Ned Card.  The hemoglobin has stabilized after transfusion last month.  She has been diagnosed with GAVE.  The bleeding is likely secondary to this and anticoagulation therapy.  The plan is to continue erythropoietin therapy for now.  We will discontinue erythropoietin if the hemoglobin stabilizes after the argon coagulation therapy.  Julieanne Manson, MD

## 2018-11-29 NOTE — Patient Instructions (Signed)

## 2018-11-29 NOTE — Telephone Encounter (Signed)
Called patient and left message that having labs done at her Oncology office is ok with [name], he would just like the results routed to him. Patient is to see her Oncologist today

## 2018-11-30 ENCOUNTER — Telehealth: Payer: Self-pay | Admitting: Nurse Practitioner

## 2018-11-30 NOTE — Telephone Encounter (Signed)
Scheduled appt per 6/9 los. Spoke with patient and patient aware of appt date and time.

## 2018-12-07 NOTE — Telephone Encounter (Signed)
Returned call to Pt.  Advised per Dr. Lovena Le that there is no scientific support that a reduced dose of Eliquis would protect Pt from stroke.  Advised Dr. Lovena Le states if she continues to have bleeding she may need to discontinue the Eliquis.  Pt indicates understanding. Pt would like to continue Eliquis at this time.  Last labs had shown Pt with an increase in hemoglobin levels.  Advised Pt to continue to monitor and call if she has any continued bleeding.  Pt thanked nurse for call.

## 2018-12-13 ENCOUNTER — Inpatient Hospital Stay: Payer: Medicare Other

## 2018-12-13 ENCOUNTER — Other Ambulatory Visit: Payer: Self-pay

## 2018-12-13 VITALS — BP 154/55 | HR 78 | Temp 98.0°F | Resp 18

## 2018-12-13 DIAGNOSIS — E1122 Type 2 diabetes mellitus with diabetic chronic kidney disease: Secondary | ICD-10-CM | POA: Diagnosis not present

## 2018-12-13 DIAGNOSIS — N183 Chronic kidney disease, stage 3 unspecified: Secondary | ICD-10-CM

## 2018-12-13 DIAGNOSIS — D631 Anemia in chronic kidney disease: Secondary | ICD-10-CM

## 2018-12-13 DIAGNOSIS — N189 Chronic kidney disease, unspecified: Secondary | ICD-10-CM

## 2018-12-13 LAB — CBC WITH DIFFERENTIAL (CANCER CENTER ONLY)
Abs Immature Granulocytes: 0.04 10*3/uL (ref 0.00–0.07)
Basophils Absolute: 0 10*3/uL (ref 0.0–0.1)
Basophils Relative: 0 %
Eosinophils Absolute: 0.1 10*3/uL (ref 0.0–0.5)
Eosinophils Relative: 1 %
HCT: 34.9 % — ABNORMAL LOW (ref 36.0–46.0)
Hemoglobin: 10.6 g/dL — ABNORMAL LOW (ref 12.0–15.0)
Immature Granulocytes: 0 %
Lymphocytes Relative: 6 %
Lymphs Abs: 0.6 10*3/uL — ABNORMAL LOW (ref 0.7–4.0)
MCH: 29.6 pg (ref 26.0–34.0)
MCHC: 30.4 g/dL (ref 30.0–36.0)
MCV: 97.5 fL (ref 80.0–100.0)
Monocytes Absolute: 0.5 10*3/uL (ref 0.1–1.0)
Monocytes Relative: 6 %
Neutro Abs: 8 10*3/uL — ABNORMAL HIGH (ref 1.7–7.7)
Neutrophils Relative %: 87 %
Platelet Count: 318 10*3/uL (ref 150–400)
RBC: 3.58 MIL/uL — ABNORMAL LOW (ref 3.87–5.11)
RDW: 14.6 % (ref 11.5–15.5)
WBC Count: 9.2 10*3/uL (ref 4.0–10.5)
nRBC: 0 % (ref 0.0–0.2)

## 2018-12-13 MED ORDER — DARBEPOETIN ALFA 200 MCG/0.4ML IJ SOSY
200.0000 ug | PREFILLED_SYRINGE | Freq: Once | INTRAMUSCULAR | Status: AC
  Administered 2018-12-13: 12:00:00 200 ug via SUBCUTANEOUS

## 2018-12-13 MED ORDER — DARBEPOETIN ALFA 200 MCG/0.4ML IJ SOSY
PREFILLED_SYRINGE | INTRAMUSCULAR | Status: AC
  Filled 2018-12-13: qty 0.4

## 2018-12-16 ENCOUNTER — Telehealth: Payer: Self-pay

## 2018-12-16 ENCOUNTER — Other Ambulatory Visit: Payer: Self-pay

## 2018-12-16 DIAGNOSIS — N189 Chronic kidney disease, unspecified: Secondary | ICD-10-CM

## 2018-12-16 DIAGNOSIS — D631 Anemia in chronic kidney disease: Secondary | ICD-10-CM

## 2018-12-16 NOTE — Telephone Encounter (Signed)
-----  Message from Juliane Poot, LPN sent at 2/99/3716  8:02 AM EDT -----  ----- Message ----- From: Owens Shark, NP Sent: 12/15/2018   4:13 PM EDT To: Juliane Poot, LPN  Please let Ms. Theissen know we are placing Aranesp on hold due to the improved hemoglobin.  She will need a lab appointment for a CBC in 1 month.  Thanks

## 2018-12-16 NOTE — Telephone Encounter (Signed)
Spoke with pt advised Per Cira Rue NP that Aranesp is being placed on hold due to improved hemoglobin. Advised that labs will be redrawn in 1 month and she will get a call with the date and appointment time. Pt verbalized understanding.

## 2018-12-27 ENCOUNTER — Ambulatory Visit (INDEPENDENT_AMBULATORY_CARE_PROVIDER_SITE_OTHER): Payer: Medicare Other | Admitting: *Deleted

## 2018-12-27 DIAGNOSIS — I442 Atrioventricular block, complete: Secondary | ICD-10-CM | POA: Diagnosis not present

## 2018-12-27 LAB — CUP PACEART REMOTE DEVICE CHECK
Battery Remaining Longevity: 75 mo
Battery Voltage: 3.01 V
Brady Statistic AP VP Percent: 28.14 %
Brady Statistic AP VS Percent: 1.23 %
Brady Statistic AS VP Percent: 46.25 %
Brady Statistic AS VS Percent: 24.38 %
Brady Statistic RA Percent Paced: 29.3 %
Brady Statistic RV Percent Paced: 73.9 %
Date Time Interrogation Session: 20200707154157
Implantable Lead Implant Date: 20161227
Implantable Lead Implant Date: 20161227
Implantable Lead Location: 753859
Implantable Lead Location: 753860
Implantable Lead Model: 5076
Implantable Lead Model: 5076
Implantable Pulse Generator Implant Date: 20161227
Lead Channel Impedance Value: 361 Ohm
Lead Channel Impedance Value: 380 Ohm
Lead Channel Impedance Value: 399 Ohm
Lead Channel Impedance Value: 399 Ohm
Lead Channel Pacing Threshold Amplitude: 0.75 V
Lead Channel Pacing Threshold Amplitude: 1.125 V
Lead Channel Pacing Threshold Pulse Width: 0.4 ms
Lead Channel Pacing Threshold Pulse Width: 0.4 ms
Lead Channel Sensing Intrinsic Amplitude: 15.25 mV
Lead Channel Sensing Intrinsic Amplitude: 15.25 mV
Lead Channel Sensing Intrinsic Amplitude: 2.625 mV
Lead Channel Sensing Intrinsic Amplitude: 2.625 mV
Lead Channel Setting Pacing Amplitude: 2 V
Lead Channel Setting Pacing Amplitude: 2.5 V
Lead Channel Setting Pacing Pulse Width: 0.4 ms
Lead Channel Setting Sensing Sensitivity: 2.8 mV

## 2019-01-07 ENCOUNTER — Encounter: Payer: Self-pay | Admitting: Cardiology

## 2019-01-07 NOTE — Progress Notes (Signed)
Remote pacemaker transmission.   

## 2019-01-13 ENCOUNTER — Inpatient Hospital Stay: Payer: Medicare Other | Attending: Oncology

## 2019-01-13 ENCOUNTER — Inpatient Hospital Stay: Payer: Medicare Other

## 2019-01-13 ENCOUNTER — Other Ambulatory Visit: Payer: Self-pay

## 2019-01-13 DIAGNOSIS — M3500 Sicca syndrome, unspecified: Secondary | ICD-10-CM | POA: Diagnosis not present

## 2019-01-13 DIAGNOSIS — N189 Chronic kidney disease, unspecified: Secondary | ICD-10-CM

## 2019-01-13 DIAGNOSIS — M1990 Unspecified osteoarthritis, unspecified site: Secondary | ICD-10-CM | POA: Insufficient documentation

## 2019-01-13 DIAGNOSIS — N183 Chronic kidney disease, stage 3 (moderate): Secondary | ICD-10-CM | POA: Diagnosis present

## 2019-01-13 DIAGNOSIS — I4891 Unspecified atrial fibrillation: Secondary | ICD-10-CM | POA: Insufficient documentation

## 2019-01-13 DIAGNOSIS — D631 Anemia in chronic kidney disease: Secondary | ICD-10-CM | POA: Insufficient documentation

## 2019-01-13 DIAGNOSIS — E1122 Type 2 diabetes mellitus with diabetic chronic kidney disease: Secondary | ICD-10-CM | POA: Diagnosis present

## 2019-01-13 DIAGNOSIS — Z79899 Other long term (current) drug therapy: Secondary | ICD-10-CM | POA: Insufficient documentation

## 2019-01-13 LAB — CBC WITH DIFFERENTIAL (CANCER CENTER ONLY)
Abs Immature Granulocytes: 0.02 10*3/uL (ref 0.00–0.07)
Basophils Absolute: 0 10*3/uL (ref 0.0–0.1)
Basophils Relative: 0 %
Eosinophils Absolute: 0 10*3/uL (ref 0.0–0.5)
Eosinophils Relative: 0 %
HCT: 35.4 % — ABNORMAL LOW (ref 36.0–46.0)
Hemoglobin: 11 g/dL — ABNORMAL LOW (ref 12.0–15.0)
Immature Granulocytes: 0 %
Lymphocytes Relative: 10 %
Lymphs Abs: 0.8 10*3/uL (ref 0.7–4.0)
MCH: 29.7 pg (ref 26.0–34.0)
MCHC: 31.1 g/dL (ref 30.0–36.0)
MCV: 95.7 fL (ref 80.0–100.0)
Monocytes Absolute: 0.6 10*3/uL (ref 0.1–1.0)
Monocytes Relative: 8 %
Neutro Abs: 6.6 10*3/uL (ref 1.7–7.7)
Neutrophils Relative %: 82 %
Platelet Count: 268 10*3/uL (ref 150–400)
RBC: 3.7 MIL/uL — ABNORMAL LOW (ref 3.87–5.11)
RDW: 14.7 % (ref 11.5–15.5)
WBC Count: 8.1 10*3/uL (ref 4.0–10.5)
nRBC: 0 % (ref 0.0–0.2)

## 2019-01-13 NOTE — Progress Notes (Signed)
Pt HGB is 11 today no injection needed at this time. Copy of labs given to pt

## 2019-01-24 ENCOUNTER — Inpatient Hospital Stay (HOSPITAL_BASED_OUTPATIENT_CLINIC_OR_DEPARTMENT_OTHER): Payer: Medicare Other | Admitting: Oncology

## 2019-01-24 ENCOUNTER — Inpatient Hospital Stay: Payer: Medicare Other | Attending: Oncology

## 2019-01-24 ENCOUNTER — Other Ambulatory Visit: Payer: Self-pay

## 2019-01-24 ENCOUNTER — Inpatient Hospital Stay: Payer: Medicare Other

## 2019-01-24 VITALS — BP 153/53 | HR 64 | Temp 98.7°F | Resp 18 | Ht <= 58 in | Wt 205.4 lb

## 2019-01-24 DIAGNOSIS — M3500 Sicca syndrome, unspecified: Secondary | ICD-10-CM | POA: Diagnosis not present

## 2019-01-24 DIAGNOSIS — Z79899 Other long term (current) drug therapy: Secondary | ICD-10-CM | POA: Insufficient documentation

## 2019-01-24 DIAGNOSIS — N189 Chronic kidney disease, unspecified: Secondary | ICD-10-CM

## 2019-01-24 DIAGNOSIS — I4891 Unspecified atrial fibrillation: Secondary | ICD-10-CM | POA: Insufficient documentation

## 2019-01-24 DIAGNOSIS — D631 Anemia in chronic kidney disease: Secondary | ICD-10-CM | POA: Insufficient documentation

## 2019-01-24 DIAGNOSIS — N183 Chronic kidney disease, stage 3 unspecified: Secondary | ICD-10-CM

## 2019-01-24 DIAGNOSIS — E119 Type 2 diabetes mellitus without complications: Secondary | ICD-10-CM | POA: Insufficient documentation

## 2019-01-24 LAB — CBC WITH DIFFERENTIAL (CANCER CENTER ONLY)
Abs Immature Granulocytes: 0.01 10*3/uL (ref 0.00–0.07)
Basophils Absolute: 0 10*3/uL (ref 0.0–0.1)
Basophils Relative: 0 %
Eosinophils Absolute: 0 10*3/uL (ref 0.0–0.5)
Eosinophils Relative: 0 %
HCT: 33.3 % — ABNORMAL LOW (ref 36.0–46.0)
Hemoglobin: 10.4 g/dL — ABNORMAL LOW (ref 12.0–15.0)
Immature Granulocytes: 0 %
Lymphocytes Relative: 9 %
Lymphs Abs: 0.6 10*3/uL — ABNORMAL LOW (ref 0.7–4.0)
MCH: 29.6 pg (ref 26.0–34.0)
MCHC: 31.2 g/dL (ref 30.0–36.0)
MCV: 94.9 fL (ref 80.0–100.0)
Monocytes Absolute: 0.5 10*3/uL (ref 0.1–1.0)
Monocytes Relative: 8 %
Neutro Abs: 5.9 10*3/uL (ref 1.7–7.7)
Neutrophils Relative %: 83 %
Platelet Count: 246 10*3/uL (ref 150–400)
RBC: 3.51 MIL/uL — ABNORMAL LOW (ref 3.87–5.11)
RDW: 14.6 % (ref 11.5–15.5)
WBC Count: 7.1 10*3/uL (ref 4.0–10.5)
nRBC: 0 % (ref 0.0–0.2)

## 2019-01-24 MED ORDER — DARBEPOETIN ALFA 200 MCG/0.4ML IJ SOSY: 200.0000 ug | PREFILLED_SYRINGE | Freq: Once | INTRAMUSCULAR | Status: DC

## 2019-01-24 MED ORDER — DARBEPOETIN ALFA 200 MCG/0.4ML IJ SOSY
PREFILLED_SYRINGE | INTRAMUSCULAR | Status: AC
  Filled 2019-01-24: qty 0.4

## 2019-01-24 NOTE — Progress Notes (Signed)
Pt Aranesp injection held today per DR. Sherrill

## 2019-01-24 NOTE — Progress Notes (Signed)
[facility] OFFICE PROGRESS NOTE   Diagnosis: Anemia  INTERVAL HISTORY:   Tricia Gonzales returns as scheduled.  She continues to have left hip discomfort that limits her ability to ambulate.  She reports orthopedics at [facility] plans to perform hip surgery when she loses more weight.  No bleeding.  She last received Aranesp on 12/13/2018.  Objective:  Vital signs in last 24 hours:  Blood pressure (!) 153/53, pulse 64, temperature 98.7 F (37.1 C), temperature source Oral, resp. rate 18, height _0  (1.473 m), weight 205 lb 6.4 oz (93.2 kg), SpO2 99 %.   Physical examination not performed today  Lab Results:  Lab Results  Component Value Date   WBC 7.1 01/24/2019   HGB 10.4 (L) 01/24/2019   HCT 33.3 (L) 01/24/2019   MCV 94.9 01/24/2019   PLT 246 01/24/2019   NEUTROABS 5.9 01/24/2019    CMP  Lab Results  Component Value Date   NA 140 07/05/2018   K 4.4 07/05/2018   CL 104 07/05/2018   CO2 28 07/05/2018   GLUCOSE 159 (H) 07/05/2018   BUN 62 (H) 07/05/2018   CREATININE 1.69 (H) 07/05/2018   CALCIUM 8.9 07/05/2018   PROT 6.1 (L) 07/05/2018   ALBUMIN 3.3 (L) 07/05/2018   AST 12 (L) 07/05/2018   ALT 8 07/05/2018   ALKPHOS 59 07/05/2018   BILITOT 0.3 07/05/2018   GFRNONAA 29 (L) 07/05/2018   GFRAA 34 (L) 07/05/2018     Medications: I have reviewed the patient's current medications.   Assessment/Plan: 1. 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; quantitative immunoglobulins show IgG mildly decreased 654 (range [phone]),  IgA and IgM in normal range; polyclonal serum light chain elevation  11/15/2015-hemoglobin 11.2  11/29/2015-hemoglobin 11.1  12/12/2005-hemoglobin 10.4; Aranesp 100 g every 2 weeks initiated  02/07/2016-hemoglobin 11.0, Aranesp held, changed to an every 3 week schedule  Progressive anemia and decreased iron stores December 2017, treated with IV iron January 2018 with improvement  She did not require Aranesp for 2 months and resumed Aranesp on a monthly schedule for 08/2016  Aranesp every 6 weeks beginning 05/04/2017  Progressive anemia and decreased iron stores 07/05/2017; IV irongiven2/11/2017 and 08/06/2017  Persistent severe anemia, Aranesp change to every 2 weeks beginning 10/19/2017  Persistent anemia, decreased iron stores 11/30/2017  IV iron 12/13/2017, 12/20/2017,  Decreased iron stores 05/17/2018  IV iron1/08/2018  07/05/2018-no serum M spike, polyclonal serum light chain elevation, IgG mildly decreased with IgA and IgM within normal range  2 units of blood 08/17/2018  IV iron 08/17/2018 and 08/24/2018  Stool heme +08/30/2018, referred to GI  2 units of blood 09/01/2018  1 unit of blood 10/25/2018  11/11/2018 colonoscopy- diverticulosis in the entire examined colon.  End-to-end colocolonic anastomosis.  One 5 mm polyp in the ascending colon.  No cause for anemia.  11/11/2018 upper endoscopy- evidence of gastric antral vascular ectasia which is the likely cause of the patient's worsening anemia.  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  Hemoglobin stabilized following treatment of the Gave  2. Chronic renal insufficiency  3. Atrial fibrillation-maintained on apixaban  4. Osteoarthritis  5. Diabetes  6. Sjogren's disease  7. Pericarditis February 2017  8.Colonoscopy 01/07/2012-prior segmental colectomy in the sigmoid colon; moderate diverticulosis  throughout the colon; otherwise normal examination     Disposition: [name] appears stable.  The hemoglobin has stabilized following treatment of the gastric vascular ectasia.  Aranesp will remain on hold.  She will return for a CBC and Aranesp as needed in 1 month.  She continues iron therapy.  She will be scheduled for an office visit in 2 months.  She is losing weight with the hope of having hip surgery in the near future.  [name], MD  01/24/2019  11:51 AM

## 2019-01-24 NOTE — Patient Instructions (Signed)

## 2019-01-26 ENCOUNTER — Telehealth: Payer: Self-pay | Admitting: Oncology

## 2019-01-26 NOTE — Telephone Encounter (Signed)
Called and left msg about sept appts. Mailed printout

## 2019-02-14 ENCOUNTER — Other Ambulatory Visit: Payer: Self-pay | Admitting: Internal Medicine

## 2019-02-15 NOTE — Telephone Encounter (Signed)
Prescription refill request for Eliquis received.  Last office visit: Tricia Gonzales (07-19-2018) Scr: 1.43 (07-05-2018) Age: 78 y.o. Weight: 93.2 kg   Prescription refill sent.

## 2019-02-16 ENCOUNTER — Encounter: Payer: Self-pay | Admitting: *Deleted

## 2019-02-16 NOTE — Progress Notes (Signed)
MD received CBC drawn on 02/09/19 at [facility]. Hgb 10.4. He still wants CBC drawn at next visit here on 02/21/19 with Aranesp injection.

## 2019-02-21 ENCOUNTER — Ambulatory Visit: Payer: Medicare Other

## 2019-02-21 ENCOUNTER — Inpatient Hospital Stay: Payer: Medicare Other | Attending: Oncology

## 2019-02-21 ENCOUNTER — Other Ambulatory Visit: Payer: Medicare Other

## 2019-02-21 ENCOUNTER — Other Ambulatory Visit: Payer: Self-pay

## 2019-02-21 ENCOUNTER — Encounter: Payer: Self-pay | Admitting: *Deleted

## 2019-02-21 ENCOUNTER — Inpatient Hospital Stay: Payer: Medicare Other

## 2019-02-21 VITALS — BP 144/62 | HR 62 | Temp 98.2°F | Resp 18

## 2019-02-21 DIAGNOSIS — N183 Chronic kidney disease, stage 3 unspecified: Secondary | ICD-10-CM

## 2019-02-21 DIAGNOSIS — N189 Chronic kidney disease, unspecified: Secondary | ICD-10-CM

## 2019-02-21 DIAGNOSIS — D631 Anemia in chronic kidney disease: Secondary | ICD-10-CM | POA: Insufficient documentation

## 2019-02-21 LAB — CBC WITH DIFFERENTIAL (CANCER CENTER ONLY)
Abs Immature Granulocytes: 0.02 10*3/uL (ref 0.00–0.07)
Basophils Absolute: 0 10*3/uL (ref 0.0–0.1)
Basophils Relative: 0 %
Eosinophils Absolute: 0 10*3/uL (ref 0.0–0.5)
Eosinophils Relative: 0 %
HCT: 32.2 % — ABNORMAL LOW (ref 36.0–46.0)
Hemoglobin: 10 g/dL — ABNORMAL LOW (ref 12.0–15.0)
Immature Granulocytes: 0 %
Lymphocytes Relative: 7 %
Lymphs Abs: 0.6 10*3/uL — ABNORMAL LOW (ref 0.7–4.0)
MCH: 30 pg (ref 26.0–34.0)
MCHC: 31.1 g/dL (ref 30.0–36.0)
MCV: 96.7 fL (ref 80.0–100.0)
Monocytes Absolute: 0.5 10*3/uL (ref 0.1–1.0)
Monocytes Relative: 6 %
Neutro Abs: 7.1 10*3/uL (ref 1.7–7.7)
Neutrophils Relative %: 87 %
Platelet Count: 280 10*3/uL (ref 150–400)
RBC: 3.33 MIL/uL — ABNORMAL LOW (ref 3.87–5.11)
RDW: 15 % (ref 11.5–15.5)
WBC Count: 8.2 10*3/uL (ref 4.0–10.5)
nRBC: 0 % (ref 0.0–0.2)

## 2019-02-21 LAB — BASIC METABOLIC PANEL - CANCER CENTER ONLY
Anion gap: 10 (ref 5–15)
BUN: 61 mg/dL — ABNORMAL HIGH (ref 8–23)
CO2: 30 mmol/L (ref 22–32)
Calcium: 10 mg/dL (ref 8.9–10.3)
Chloride: 98 mmol/L (ref 98–111)
Creatinine: 2.11 mg/dL — ABNORMAL HIGH (ref 0.44–1.00)
GFR, Est AFR Am: 26 mL/min — ABNORMAL LOW (ref >60)
GFR, Estimated: 22 mL/min — ABNORMAL LOW (ref >60)
Glucose, Bld: 144 mg/dL — ABNORMAL HIGH (ref 70–99)
Potassium: 4.4 mmol/L (ref 3.5–5.1)
Sodium: 138 mmol/L (ref 135–145)

## 2019-02-21 MED ORDER — DARBEPOETIN ALFA 200 MCG/0.4ML IJ SOSY
PREFILLED_SYRINGE | INTRAMUSCULAR | Status: AC
  Filled 2019-02-21: qty 0.4

## 2019-02-21 MED ORDER — DARBEPOETIN ALFA 200 MCG/0.4ML IJ SOSY
200.0000 ug | PREFILLED_SYRINGE | Freq: Once | INTRAMUSCULAR | Status: AC
  Administered 2019-02-21: 12:00:00 200 ug via SUBCUTANEOUS

## 2019-02-21 NOTE — Progress Notes (Signed)
Faxed results of CBC/BMP to Dr. Carol Ada per Dr. Gearldine Shown request.

## 2019-02-21 NOTE — Patient Instructions (Signed)

## 2019-03-08 ENCOUNTER — Telehealth: Payer: Self-pay | Admitting: Oncology

## 2019-03-08 NOTE — Telephone Encounter (Signed)
Returned patient's phone call regarding rescheduling an appointment, left a voicemail.

## 2019-03-08 NOTE — Telephone Encounter (Signed)
Returned patient's phone call regarding rescheduling 09/29 appointments, per patient's request appointment has been moved to 10/01.  Message to provider.

## 2019-03-21 ENCOUNTER — Ambulatory Visit: Payer: Medicare Other | Admitting: Nurse Practitioner

## 2019-03-21 ENCOUNTER — Ambulatory Visit: Payer: Medicare Other

## 2019-03-21 ENCOUNTER — Other Ambulatory Visit: Payer: Medicare Other

## 2019-03-23 ENCOUNTER — Inpatient Hospital Stay (HOSPITAL_BASED_OUTPATIENT_CLINIC_OR_DEPARTMENT_OTHER): Payer: Medicare Other | Admitting: Nurse Practitioner

## 2019-03-23 ENCOUNTER — Inpatient Hospital Stay: Payer: Medicare Other | Attending: Oncology

## 2019-03-23 ENCOUNTER — Inpatient Hospital Stay: Payer: Medicare Other

## 2019-03-23 ENCOUNTER — Other Ambulatory Visit: Payer: Self-pay

## 2019-03-23 ENCOUNTER — Encounter: Payer: Self-pay | Admitting: Nurse Practitioner

## 2019-03-23 ENCOUNTER — Telehealth: Payer: Self-pay | Admitting: Nurse Practitioner

## 2019-03-23 VITALS — BP 157/62 | HR 59 | Temp 98.3°F | Resp 18 | Wt 194.8 lb

## 2019-03-23 DIAGNOSIS — D649 Anemia, unspecified: Secondary | ICD-10-CM | POA: Insufficient documentation

## 2019-03-23 DIAGNOSIS — D631 Anemia in chronic kidney disease: Secondary | ICD-10-CM | POA: Insufficient documentation

## 2019-03-23 DIAGNOSIS — N189 Chronic kidney disease, unspecified: Secondary | ICD-10-CM | POA: Diagnosis not present

## 2019-03-23 DIAGNOSIS — N183 Chronic kidney disease, stage 3 unspecified: Secondary | ICD-10-CM | POA: Diagnosis present

## 2019-03-23 DIAGNOSIS — M25552 Pain in left hip: Secondary | ICD-10-CM | POA: Diagnosis not present

## 2019-03-23 DIAGNOSIS — E1122 Type 2 diabetes mellitus with diabetic chronic kidney disease: Secondary | ICD-10-CM | POA: Diagnosis not present

## 2019-03-23 DIAGNOSIS — K921 Melena: Secondary | ICD-10-CM | POA: Diagnosis not present

## 2019-03-23 LAB — CBC WITH DIFFERENTIAL (CANCER CENTER ONLY)
Abs Immature Granulocytes: 0.03 10*3/uL (ref 0.00–0.07)
Basophils Absolute: 0 10*3/uL (ref 0.0–0.1)
Basophils Relative: 0 %
Eosinophils Absolute: 0 10*3/uL (ref 0.0–0.5)
Eosinophils Relative: 0 %
HCT: 33.2 % — ABNORMAL LOW (ref 36.0–46.0)
Hemoglobin: 10.4 g/dL — ABNORMAL LOW (ref 12.0–15.0)
Immature Granulocytes: 0 %
Lymphocytes Relative: 5 %
Lymphs Abs: 0.5 10*3/uL — ABNORMAL LOW (ref 0.7–4.0)
MCH: 30.5 pg (ref 26.0–34.0)
MCHC: 31.3 g/dL (ref 30.0–36.0)
MCV: 97.4 fL (ref 80.0–100.0)
Monocytes Absolute: 0.5 10*3/uL (ref 0.1–1.0)
Monocytes Relative: 5 %
Neutro Abs: 8.8 10*3/uL — ABNORMAL HIGH (ref 1.7–7.7)
Neutrophils Relative %: 90 %
Platelet Count: 265 10*3/uL (ref 150–400)
RBC: 3.41 MIL/uL — ABNORMAL LOW (ref 3.87–5.11)
RDW: 14.6 % (ref 11.5–15.5)
WBC Count: 9.9 10*3/uL (ref 4.0–10.5)
nRBC: 0 % (ref 0.0–0.2)

## 2019-03-23 NOTE — Telephone Encounter (Signed)
Scheduled per 10/01 los, patient received after visit summary and calender.

## 2019-03-23 NOTE — Progress Notes (Signed)
[place] OFFICE PROGRESS NOTE   Diagnosis: Anemia  INTERVAL HISTORY:   Tricia Gonzales returns as scheduled.  She received an Aranesp injection on 02/21/2019.  She is not aware of any bleeding.  She continues oral iron and notes black stools.  She continues to have left hip pain.  She is hopeful to have left hip surgery in the near future.  She is trying to lose weight in anticipation of the surgery.  Objective:  Vital signs in last 24 hours:  Blood pressure (!) 157/62, pulse (!) 59, temperature 98.3 F (36.8 C), temperature source Oral, resp. rate 18, weight 194 lb 12.8 oz (88.4 kg), SpO2 99 %.    GI: Abdomen soft and nontender.  No hepatosplenomegaly. Vascular: Trace edema at the lower legs bilaterally. Neuro: Alert and oriented.    Lab Results:  Lab Results  Component Value Date   WBC 9.9 03/23/2019   HGB 10.4 (L) 03/23/2019   HCT 33.2 (L) 03/23/2019   MCV 97.4 03/23/2019   PLT 265 03/23/2019   NEUTROABS 8.8 (H) 03/23/2019    Imaging:  No results found.  Medications: I have reviewed the patient's current medications.  Assessment/Plan: 1. 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; quantitative immunoglobulins show IgG mildly decreased 654 (range [phone]), IgA and IgM in normal range; polyclonal serum light chain elevation  11/15/2015-hemoglobin 11.2  11/29/2015-hemoglobin 11.1  12/12/2005-hemoglobin 10.4; Aranesp 100 g every 2 weeks initiated  02/07/2016-hemoglobin 11.0, Aranesp held, changed to an every 3 week schedule   Progressive anemia and decreased iron stores December 2017, treated with IV iron January 2018 with improvement  She did not require Aranesp for 2 months and resumed Aranesp on a monthly schedule for 08/2016  Aranesp every 6 weeks beginning 05/04/2017  Progressive anemia and decreased iron stores 07/05/2017; IV irongiven2/11/2017 and 08/06/2017  Persistent severe anemia, Aranesp change to every 2 weeks beginning 10/19/2017  Persistent anemia, decreased iron stores 11/30/2017  IV iron 12/13/2017, 12/20/2017,  Decreased iron stores 05/17/2018  IV iron1/08/2018  07/05/2018-no serum M spike, polyclonal serum light chain elevation, IgG mildly decreased with IgA and IgM within normal range  2 units of blood 08/17/2018  IV iron 08/17/2018 and 08/24/2018  Stool heme +08/30/2018, referred to GI  2 units of blood 09/01/2018  1 unit of blood 10/25/2018  11/11/2018 colonoscopy- diverticulosis in the entire examined colon.  End-to-end colocolonic anastomosis.  One 5 mm polyp in the ascending colon.  No cause for anemia.  11/11/2018 upper endoscopy- evidence of gastric antral vascular ectasia which is the likely cause of the patient's worsening anemia.  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  Hemoglobin stabilized following treatment of the Gave  2. Chronic renal insufficiency  3. Atrial fibrillation-maintained on apixaban  4. Osteoarthritis  5. Diabetes  6. Sjogren's disease  7. Pericarditis February 2017  8.Colonoscopy 01/07/2012-prior segmental colectomy in the sigmoid colon; moderate diverticulosis throughout the colon; otherwise normal examination   Disposition: Ms. Mutz appears stable.  We reviewed the CBC from today.  The hemoglobin remains stable.  We are placing Aranesp on hold.  She will return for a  follow-up CBC in 6 weeks, Aranesp if needed.  She continues oral  iron.  She will return for lab and follow-up in 3 months.  She will contact the office in the interim with any problems.  Plan reviewed with Dr. Benay Spice.  Ned Card ANP/GNP-BC   03/23/2019  12:44 PM

## 2019-03-28 ENCOUNTER — Ambulatory Visit (INDEPENDENT_AMBULATORY_CARE_PROVIDER_SITE_OTHER): Payer: Medicare Other | Admitting: *Deleted

## 2019-03-28 DIAGNOSIS — I4891 Unspecified atrial fibrillation: Secondary | ICD-10-CM | POA: Diagnosis not present

## 2019-03-29 DIAGNOSIS — M1612 Unilateral primary osteoarthritis, left hip: Secondary | ICD-10-CM | POA: Insufficient documentation

## 2019-03-29 LAB — CUP PACEART REMOTE DEVICE CHECK
Battery Remaining Longevity: 65 mo
Battery Voltage: 3 V
Brady Statistic AP VP Percent: 71.43 %
Brady Statistic AP VS Percent: 0.85 %
Brady Statistic AS VP Percent: 19.06 %
Brady Statistic AS VS Percent: 8.66 %
Brady Statistic RA Percent Paced: 71.56 %
Brady Statistic RV Percent Paced: 88.8 %
Date Time Interrogation Session: 20201006204322
Implantable Lead Implant Date: 20161227
Implantable Lead Implant Date: 20161227
Implantable Lead Location: 753859
Implantable Lead Location: 753860
Implantable Lead Model: 5076
Implantable Lead Model: 5076
Implantable Pulse Generator Implant Date: 20161227
Lead Channel Impedance Value: 323 Ohm
Lead Channel Impedance Value: 342 Ohm
Lead Channel Impedance Value: 342 Ohm
Lead Channel Impedance Value: 361 Ohm
Lead Channel Pacing Threshold Amplitude: 0.75 V
Lead Channel Pacing Threshold Amplitude: 1.375 V
Lead Channel Pacing Threshold Pulse Width: 0.4 ms
Lead Channel Pacing Threshold Pulse Width: 0.4 ms
Lead Channel Sensing Intrinsic Amplitude: 1.375 mV
Lead Channel Sensing Intrinsic Amplitude: 1.375 mV
Lead Channel Sensing Intrinsic Amplitude: 15.875 mV
Lead Channel Sensing Intrinsic Amplitude: 15.875 mV
Lead Channel Setting Pacing Amplitude: 2 V
Lead Channel Setting Pacing Amplitude: 2.75 V
Lead Channel Setting Pacing Pulse Width: 0.4 ms
Lead Channel Setting Sensing Sensitivity: 2.8 mV

## 2019-04-05 NOTE — Progress Notes (Signed)
Remote pacemaker transmission.   

## 2019-04-06 ENCOUNTER — Telehealth: Payer: Self-pay | Admitting: *Deleted

## 2019-04-06 NOTE — Telephone Encounter (Signed)
Needs to reschedule her aranesp injection from 05/04/19, since she is having hip replacement on 05/01/19. She would like to come on 04/24/19. Scheduling message sent.

## 2019-04-07 ENCOUNTER — Telehealth: Payer: Self-pay | Admitting: Oncology

## 2019-04-07 NOTE — Telephone Encounter (Signed)
Per 10/15 scheduled messaged moved 11/12 lab/injection to 11/2. Confirmed with patient.

## 2019-04-17 ENCOUNTER — Telehealth: Payer: Self-pay | Admitting: *Deleted

## 2019-04-17 NOTE — Telephone Encounter (Signed)
Patient left VM requesting return call today to discuss upcoming surgery she is having. Returned call and left VM that the nurse will call you tomorrow.

## 2019-04-18 ENCOUNTER — Other Ambulatory Visit: Payer: Self-pay | Admitting: *Deleted

## 2019-04-18 ENCOUNTER — Telehealth: Payer: Self-pay | Admitting: *Deleted

## 2019-04-18 NOTE — Telephone Encounter (Signed)
Pt states she is having surgery at [facility] on 11/9. Her Hgb at [facility] was ~ 10.1. They want her to receive blood prior to surgery. They are OK of she received blood here.    She is going to arrange transfusion at [facility].

## 2019-04-19 ENCOUNTER — Telehealth: Payer: Self-pay | Admitting: *Deleted

## 2019-04-19 NOTE — Telephone Encounter (Signed)
Needs the 11/2 lab/injection moved to early in the morning. Has appointment at [facility] that afternoon. High priority message sent to scheduling.

## 2019-04-24 ENCOUNTER — Inpatient Hospital Stay: Payer: Medicare Other

## 2019-04-24 ENCOUNTER — Other Ambulatory Visit: Payer: Self-pay

## 2019-04-24 ENCOUNTER — Other Ambulatory Visit: Payer: Medicare Other

## 2019-04-24 ENCOUNTER — Inpatient Hospital Stay: Payer: Medicare Other | Attending: Oncology

## 2019-04-24 ENCOUNTER — Ambulatory Visit: Payer: Medicare Other

## 2019-04-24 VITALS — BP 153/59 | HR 59 | Temp 98.0°F | Resp 16

## 2019-04-24 DIAGNOSIS — D631 Anemia in chronic kidney disease: Secondary | ICD-10-CM | POA: Insufficient documentation

## 2019-04-24 DIAGNOSIS — N189 Chronic kidney disease, unspecified: Secondary | ICD-10-CM

## 2019-04-24 DIAGNOSIS — N183 Chronic kidney disease, stage 3 unspecified: Secondary | ICD-10-CM | POA: Insufficient documentation

## 2019-04-24 DIAGNOSIS — R790 Abnormal level of blood mineral: Secondary | ICD-10-CM | POA: Insufficient documentation

## 2019-04-24 DIAGNOSIS — D649 Anemia, unspecified: Secondary | ICD-10-CM | POA: Insufficient documentation

## 2019-04-24 LAB — CBC WITH DIFFERENTIAL (CANCER CENTER ONLY)
Abs Immature Granulocytes: 0.02 10*3/uL (ref 0.00–0.07)
Basophils Absolute: 0 10*3/uL (ref 0.0–0.1)
Basophils Relative: 0 %
Eosinophils Absolute: 0 10*3/uL (ref 0.0–0.5)
Eosinophils Relative: 0 %
HCT: 31.2 % — ABNORMAL LOW (ref 36.0–46.0)
Hemoglobin: 9.9 g/dL — ABNORMAL LOW (ref 12.0–15.0)
Immature Granulocytes: 0 %
Lymphocytes Relative: 7 %
Lymphs Abs: 0.6 10*3/uL — ABNORMAL LOW (ref 0.7–4.0)
MCH: 31.3 pg (ref 26.0–34.0)
MCHC: 31.7 g/dL (ref 30.0–36.0)
MCV: 98.7 fL (ref 80.0–100.0)
Monocytes Absolute: 0.5 10*3/uL (ref 0.1–1.0)
Monocytes Relative: 7 %
Neutro Abs: 7 10*3/uL (ref 1.7–7.7)
Neutrophils Relative %: 86 %
Platelet Count: 263 10*3/uL (ref 150–400)
RBC: 3.16 MIL/uL — ABNORMAL LOW (ref 3.87–5.11)
RDW: 14.6 % (ref 11.5–15.5)
WBC Count: 8.2 10*3/uL (ref 4.0–10.5)
nRBC: 0 % (ref 0.0–0.2)

## 2019-04-24 MED ORDER — EPOETIN ALFA-EPBX 40000 UNIT/ML IJ SOLN
INTRAMUSCULAR | Status: AC
  Filled 2019-04-24: qty 1

## 2019-04-24 MED ORDER — EPOETIN ALFA-EPBX 40000 UNIT/ML IJ SOLN
40000.0000 [IU] | Freq: Once | INTRAMUSCULAR | Status: AC
  Administered 2019-04-24: 12:00:00 40000 [IU] via SUBCUTANEOUS

## 2019-04-24 NOTE — Patient Instructions (Signed)

## 2019-04-24 NOTE — Progress Notes (Signed)
04/24/19  Received order to discontinue Aranesp as not covered by patients insurance and convert to Retacrit 40,000 units Subq every 3 weeks.  Plan updated per above instructions.  T.O. Dr Darletta Moll, PharmD

## 2019-04-25 ENCOUNTER — Telehealth: Payer: Self-pay | Admitting: *Deleted

## 2019-04-25 ENCOUNTER — Telehealth: Payer: Self-pay | Admitting: Internal Medicine

## 2019-04-25 ENCOUNTER — Telehealth: Payer: Self-pay | Admitting: Oncology

## 2019-04-25 ENCOUNTER — Telehealth: Payer: Self-pay

## 2019-04-25 NOTE — Telephone Encounter (Signed)
[facility] Pre-operative Risk Assessment    Request for surgical clearance:  1. What type of surgery is being performed? L Hip Replacement  2. When is this surgery scheduled? 05/01/19  3. What type of clearance is required (medical clearance vs. Pharmacy clearance to hold med vs. Both)? both  4. Are there any medications that need to be held prior to surgery and how long? Eliquis or any blood thinners. Bridging may be necessary depending on the medication    5. Practice name and name of physician performing surgery? Dr. Rosanne Ashing, [place] , [facility]  6. What is your office phone number: [phone]    7.   What is your office fax number: [phone]   8.   Anesthesia type (None, local, MAC, general) ? General   The office needs a form or a letter that states she is cleared to have the procedure, as well as any specific instructions that the patient will need to adhere to   Johnna Acosta 04/25/2019, 3:06 PM  _________________________________________________________________   (provider comments below)

## 2019-04-25 NOTE — Telephone Encounter (Signed)
Spoke with patient to inform that Tricia Gonzales is requested her to have IV iron this week and that scheduling should be calling her.  Patient voiced understanding but mentioned that her schedule is tight this week due to surgery on Monday.  Nurse explained that scheduling would try to find a time that works her schedule.

## 2019-04-25 NOTE — Telephone Encounter (Signed)
"  Lurline Del PA [phone]).  Alvester Chou mutual patient to see surgeon tomorrow at [facility] before hip surgery planned next week.  Would like her to receive iron infusion there before surgery to optimize labs."   Gave notice to [facility] providers and gave response to PA providers will return call.

## 2019-04-25 NOTE — Telephone Encounter (Signed)
-----  Message from Owens Shark, NP sent at 04/25/2019  1:24 PM EST ----- Please let her know [name] is requesting we give her IV iron this week.  I have sent a message to scheduling.

## 2019-04-25 NOTE — Telephone Encounter (Signed)
Patient with diagnosis of afib on Eliquis for anticoagulation.    Procedure:  L Hip Replacement Date of procedure: 05/01/2019  CHADS2-VASc score of  5 (HTN, AGE, DM2, AGE, female)  CrCl 21 ml/min (adjusted body weight) 52m/min actual body weight  Per office protocol, patient can hold Eliquis for 4 days prior to procedure.

## 2019-04-25 NOTE — Telephone Encounter (Signed)
Scheduled appt per 11/3 sch message - pt is aware of appt date and time

## 2019-04-26 DIAGNOSIS — Z7409 Other reduced mobility: Secondary | ICD-10-CM | POA: Insufficient documentation

## 2019-04-26 NOTE — Telephone Encounter (Signed)
Dr. Lovena Le  Can you please comment on this patients upcoming procedure and holding her Eliquis? Pharmacy has made their recommendations as below. She is planned for a left hip replacement and unfortunately this is scheduled for 05/01/19 at [facility]. Typically, we like to have the patient seen in office within 6 months of their procedure and you last saw her 06/2018. Would you prefer an attempt at phone clearance if she is asymptomatic or would you prefer that she be seen in the office prior to the hip replacement?    Thank you  Sharee Pimple

## 2019-04-27 ENCOUNTER — Inpatient Hospital Stay: Payer: Medicare Other

## 2019-04-27 ENCOUNTER — Other Ambulatory Visit: Payer: Self-pay

## 2019-04-27 ENCOUNTER — Other Ambulatory Visit: Payer: Self-pay | Admitting: Nurse Practitioner

## 2019-04-27 DIAGNOSIS — D631 Anemia in chronic kidney disease: Secondary | ICD-10-CM

## 2019-04-27 DIAGNOSIS — D649 Anemia, unspecified: Secondary | ICD-10-CM

## 2019-04-27 DIAGNOSIS — N189 Chronic kidney disease, unspecified: Secondary | ICD-10-CM

## 2019-04-27 MED ORDER — SODIUM CHLORIDE 0.9 % IV SOLN
INTRAVENOUS | Status: DC
  Administered 2019-04-27: 10:00:00 via INTRAVENOUS
  Filled 2019-04-27: qty 250

## 2019-04-27 MED ORDER — SODIUM CHLORIDE 0.9 % IV SOLN
510.0000 mg | Freq: Once | INTRAVENOUS | Status: AC
  Administered 2019-04-27: 10:00:00 510 mg via INTRAVENOUS
  Filled 2019-04-27: qty 510

## 2019-04-27 NOTE — Progress Notes (Signed)
Pt. declines to stay post 30 minutes Feraheme observation. Denies complaints and no shortness of breath noted.

## 2019-04-27 NOTE — Patient Instructions (Signed)
Ferumoxytol injection What is this medicine? FERUMOXYTOL is an iron complex. Iron is used to make healthy red blood cells, which carry oxygen and nutrients throughout the body. This medicine is used to treat iron deficiency anemia. This medicine may be used for other purposes; ask your health care provider or pharmacist if you have questions. COMMON BRAND NAME(S): Feraheme What should I tell my health care provider before I take this medicine? They need to know if you have any of these conditions:  anemia not caused by low iron levels  high levels of iron in the blood  magnetic resonance imaging (MRI) test scheduled  an unusual or allergic reaction to iron, other medicines, foods, dyes, or preservatives  pregnant or trying to get pregnant  breast-feeding How should I use this medicine? This medicine is for injection into a vein. It is given by a health care professional in a hospital or clinic setting. Talk to your pediatrician regarding the use of this medicine in children. Special care may be needed. Overdosage: If you think you have taken too much of this medicine contact a poison control center or emergency room at once. NOTE: This medicine is only for you. Do not share this medicine with others. What if I miss a dose? It is important not to miss your dose. Call your doctor or health care professional if you are unable to keep an appointment. What may interact with this medicine? This medicine may interact with the following medications:  other iron products This list may not describe all possible interactions. Give your health care provider a list of all the medicines, herbs, non-prescription drugs, or dietary supplements you use. Also tell them if you smoke, drink alcohol, or use illegal drugs. Some items may interact with your medicine. What should I watch for while using this medicine? Visit your doctor or healthcare professional regularly. Tell your doctor or healthcare  professional if your symptoms do not start to get better or if they get worse. You may need blood work done while you are taking this medicine. You may need to follow a special diet. Talk to your doctor. Foods that contain iron include: whole grains/cereals, dried fruits, beans, or peas, leafy green vegetables, and organ meats (liver, kidney). What side effects may I notice from receiving this medicine? Side effects that you should report to your doctor or health care professional as soon as possible:  allergic reactions like skin rash, itching or hives, swelling of the face, lips, or tongue  breathing problems  changes in blood pressure  feeling faint or lightheaded, falls  fever or chills  flushing, sweating, or hot feelings  swelling of the ankles or feet Side effects that usually do not require medical attention (report to your doctor or health care professional if they continue or are bothersome):  diarrhea  headache  nausea, vomiting  stomach pain This list may not describe all possible side effects. Call your doctor for medical advice about side effects. You may report side effects to FDA at 1-800-FDA-1088. Where should I keep my medicine? This drug is given in a hospital or clinic and will not be stored at home. NOTE: This sheet is a summary. It may not cover all possible information. If you have questions about this medicine, talk to your doctor, pharmacist, or health care provider.  2020 Elsevier/Gold Standard (2016-07-27 20:21:10) Coronavirus (COVID-19) Are you at risk?  Are you at risk for the Coronavirus (COVID-19)?  To be considered HIGH RISK for Coronavirus (COVID-19),  you have to meet the following criteria:  . Traveled to [place], [place], [place], [place] or [place]; or in the [place] to [place], [place], [place], or [place]; and have fever, cough, and shortness of breath within the last 2 weeks of travel OR . Been in close contact with a person  diagnosed with COVID-19 within the last 2 weeks and have fever, cough, and shortness of breath . IF YOU DO NOT MEET THESE CRITERIA, YOU ARE CONSIDERED LOW RISK FOR COVID-19.  What to do if you are HIGH RISK for COVID-19?  Marland Kitchen If you are having a medical emergency, call 911. . Seek medical care right away. Before you go to a doctor's office, urgent care or emergency department, call ahead and tell them about your recent travel, contact with someone diagnosed with COVID-19, and your symptoms. You should receive instructions from your physician's office regarding next steps of care.  . When you arrive at healthcare provider, tell the healthcare staff immediately you have returned from visiting [place], [place], [place], [place] or [place]; or traveled in the [place] to [place], [place], [place], or [place]; in the last two weeks or you have been in close contact with a person diagnosed with COVID-19 in the last 2 weeks.   . Tell the health care staff about your symptoms: fever, cough and shortness of breath. . After you have been seen by a medical provider, you will be either: o Tested for (COVID-19) and discharged home on quarantine except to seek medical care if symptoms worsen, and asked to  - Stay home and avoid contact with others until you get your results (4-5 days)  - Avoid travel on public transportation if possible (such as bus, train, or airplane) or o Sent to the Emergency Department by EMS for evaluation, COVID-19 testing, and possible admission depending on your condition and test results.  What to do if you are LOW RISK for COVID-19?  Reduce your risk of any infection by using the same precautions used for avoiding the common cold or flu:  Marland Kitchen Wash your hands often with soap and warm water for at least 20 seconds.  If soap and water are not readily available, use an alcohol-based hand sanitizer with at least 60% alcohol.  . If coughing or sneezing, cover your mouth and nose by  coughing or sneezing into the elbow areas of your shirt or coat, into a tissue or into your sleeve (not your hands). . Avoid shaking hands with others and consider head nods or verbal greetings only. . Avoid touching your eyes, nose, or mouth with unwashed hands.  . Avoid close contact with people who are sick. . Avoid places or events with large numbers of people in one location, like concerts or sporting events. . Carefully consider travel plans you have or are making. . If you are planning any travel outside or inside the [place], visit the CDC's Travelers' Health webpage for the latest health notices. . If you have some symptoms but not all symptoms, continue to monitor at home and seek medical attention if your symptoms worsen. . If you are having a medical emergency, call 911.   [place] / e-Visit: [email or web address]         [facility]: [facility]: [phone]                   [facility]: [phone]

## 2019-05-02 NOTE — Telephone Encounter (Signed)
Per Dr. Lovena Le-- Ok to hold Eliquis 2 days prior to surgery.  No additional f/u needed pre op.  GT

## 2019-05-03 NOTE — Telephone Encounter (Signed)
Left a voicemail for patient to call back for ongoing preoperative assessment.   Abigail Butts, PA-C 05/03/19; 8:19 AM

## 2019-05-04 ENCOUNTER — Other Ambulatory Visit: Payer: Medicare Other

## 2019-05-04 ENCOUNTER — Ambulatory Visit: Payer: Medicare Other

## 2019-05-05 ENCOUNTER — Other Ambulatory Visit: Payer: Self-pay | Admitting: Internal Medicine

## 2019-05-05 MED ORDER — NALOXONE HCL 0.4 MG/ML IJ SOLN
0.10 | INTRAMUSCULAR | Status: DC
Start: ? — End: 2019-05-05

## 2019-05-05 MED ORDER — ONDANSETRON 4 MG PO TBDP
4.00 | ORAL_TABLET | ORAL | Status: DC
Start: ? — End: 2019-05-05

## 2019-05-05 MED ORDER — OXYCODONE HCL 5 MG PO TABS
5.00 | ORAL_TABLET | ORAL | Status: DC
Start: ? — End: 2019-05-05

## 2019-05-05 MED ORDER — OXYCODONE HCL 5 MG PO TABS
10.00 | ORAL_TABLET | ORAL | Status: DC
Start: ? — End: 2019-05-05

## 2019-05-05 MED ORDER — NYSTATIN 100000 UNIT/GM EX POWD
CUTANEOUS | Status: DC
Start: 2019-05-05 — End: 2019-05-05

## 2019-05-05 MED ORDER — ROSUVASTATIN CALCIUM 5 MG PO TABS
5.00 | ORAL_TABLET | ORAL | Status: DC
Start: 2019-05-05 — End: 2019-05-05

## 2019-05-05 MED ORDER — METOPROLOL SUCCINATE ER 25 MG PO TB24
25.00 | ORAL_TABLET | ORAL | Status: DC
Start: 2019-05-06 — End: 2019-05-05

## 2019-05-05 MED ORDER — GENERIC EXTERNAL MEDICATION
30.00 | Status: DC
Start: ? — End: 2019-05-05

## 2019-05-05 MED ORDER — DEXTROSE 10 % IV SOLN
125.00 | INTRAVENOUS | Status: DC
Start: ? — End: 2019-05-05

## 2019-05-05 MED ORDER — GENERIC EXTERNAL MEDICATION
1.00 | Status: DC
Start: 2019-05-06 — End: 2019-05-05

## 2019-05-05 MED ORDER — GLUCOSE 40 % PO GEL
15.00 | ORAL | Status: DC
Start: ? — End: 2019-05-05

## 2019-05-05 MED ORDER — ENEMA 7-19 GM/118ML RE ENEM
1.00 | ENEMA | RECTAL | Status: DC
Start: ? — End: 2019-05-05

## 2019-05-05 MED ORDER — APIXABAN 5 MG PO TABS
5.00 | ORAL_TABLET | ORAL | Status: DC
Start: 2019-05-05 — End: 2019-05-05

## 2019-05-05 MED ORDER — OXYCODONE HCL 5 MG PO TABS: 5.0000 mg | ORAL_TABLET | Freq: Four times a day (QID) | ORAL | 0 refills | Status: DC | PRN

## 2019-05-05 MED ORDER — POLYETHYLENE GLYCOL 3350 17 G PO PACK
17.00 | PACK | ORAL | Status: DC
Start: 2019-05-06 — End: 2019-05-05

## 2019-05-05 MED ORDER — LEVOTHYROXINE SODIUM 25 MCG PO TABS
50.00 | ORAL_TABLET | ORAL | Status: DC
Start: 2019-05-06 — End: 2019-05-05

## 2019-05-05 MED ORDER — GENERIC EXTERNAL MEDICATION
Status: DC
Start: ? — End: 2019-05-05

## 2019-05-05 MED ORDER — FUROSEMIDE 20 MG PO TABS
40.00 | ORAL_TABLET | ORAL | Status: DC
Start: 2019-05-06 — End: 2019-05-05

## 2019-05-05 MED ORDER — DORZOLAMIDE HCL 2 % OP SOLN
1.00 | OPHTHALMIC | Status: DC
Start: 2019-05-05 — End: 2019-05-05

## 2019-05-05 MED ORDER — SENNOSIDES-DOCUSATE SODIUM 8.6-50 MG PO TABS
2.00 | ORAL_TABLET | ORAL | Status: DC
Start: 2019-05-05 — End: 2019-05-05

## 2019-05-05 MED ORDER — DOCUSATE SODIUM 283 MG RE ENEM
283.00 | ENEMA | RECTAL | Status: DC
Start: ? — End: 2019-05-05

## 2019-05-05 MED ORDER — BISACODYL 10 MG RE SUPP
10.00 | RECTAL | Status: DC
Start: ? — End: 2019-05-05

## 2019-05-05 MED ORDER — PANTOPRAZOLE SODIUM 40 MG PO TBEC
40.00 | DELAYED_RELEASE_TABLET | ORAL | Status: DC
Start: 2019-05-06 — End: 2019-05-05

## 2019-05-05 MED ORDER — INSULIN LISPRO 100 UNIT/ML ~~LOC~~ SOLN
2.00 | SUBCUTANEOUS | Status: DC
Start: 2019-05-05 — End: 2019-05-05

## 2019-05-05 MED ORDER — GABAPENTIN 300 MG PO CAPS
300.00 | ORAL_CAPSULE | ORAL | Status: DC
Start: 2019-05-05 — End: 2019-05-05

## 2019-05-05 MED ORDER — PENTOXIFYLLINE ER 400 MG PO TBCR
400.00 | EXTENDED_RELEASE_TABLET | ORAL | Status: DC
Start: 2019-05-06 — End: 2019-05-05

## 2019-05-05 MED ORDER — NALOXONE HCL 0.4 MG/ML IJ SOLN
0.40 | INTRAMUSCULAR | Status: DC
Start: ? — End: 2019-05-05

## 2019-05-05 MED ORDER — POLYSACCHARIDE IRON COMPLEX 150 MG PO CAPS
150.00 | ORAL_CAPSULE | ORAL | Status: DC
Start: 2019-05-06 — End: 2019-05-05

## 2019-05-05 NOTE — Telephone Encounter (Signed)
   Chart reviewed as part of pre-operative protocol coverage. It appears patient has undergone surgery already. Will remove from preop box.   Charlie Pitter, PA-C 05/05/2019, 5:40 PM

## 2019-05-08 ENCOUNTER — Encounter: Payer: Self-pay | Admitting: Internal Medicine

## 2019-05-08 ENCOUNTER — Non-Acute Institutional Stay (SKILLED_NURSING_FACILITY): Payer: Medicare Other | Admitting: Internal Medicine

## 2019-05-08 DIAGNOSIS — G63 Polyneuropathy in diseases classified elsewhere: Secondary | ICD-10-CM | POA: Diagnosis not present

## 2019-05-08 DIAGNOSIS — R339 Retention of urine, unspecified: Secondary | ICD-10-CM

## 2019-05-08 DIAGNOSIS — M87052 Idiopathic aseptic necrosis of left femur: Secondary | ICD-10-CM

## 2019-05-08 DIAGNOSIS — Z794 Long term (current) use of insulin: Secondary | ICD-10-CM

## 2019-05-08 DIAGNOSIS — M1612 Unilateral primary osteoarthritis, left hip: Secondary | ICD-10-CM | POA: Diagnosis not present

## 2019-05-08 DIAGNOSIS — E1142 Type 2 diabetes mellitus with diabetic polyneuropathy: Secondary | ICD-10-CM

## 2019-05-08 DIAGNOSIS — I10 Essential (primary) hypertension: Secondary | ICD-10-CM

## 2019-05-08 DIAGNOSIS — I4891 Unspecified atrial fibrillation: Secondary | ICD-10-CM

## 2019-05-08 MED ORDER — HYDROCODONE-ACETAMINOPHEN 5-325 MG PO TABS: 1.0000 | ORAL_TABLET | Freq: Every day | ORAL | 0 refills | Status: DC

## 2019-05-08 NOTE — Progress Notes (Signed)
: Provider:  Hennie Duos., MD Location:  [facility] Room Number: 506-P Place of Service:  SNF [phone])  PCP: Hennie Duos, MD Patient Care Team: Hennie Duos, MD as PCP - General (Internal Medicine) Evans Lance, MD as PCP - Electrophysiology (Cardiology)  Extended Emergency Contact Information Primary Emergency Contact: Kozel-Smith,[name] Address: [place], [place] [address] [address] of [place] Phone: [phone] Mobile Phone: [phone] Relation: Daughter Secondary Emergency Contact: Osker Mason, [place] [address] [address]itter of [place] Phone: [phone] Mobile Phone: [phone] Relation: Son     Allergies: Norvasc [amlodipine besylate], Avapro [irbesartan], Glucophage [metformin hydrochloride], Lisinopril, Losartan, Morphine and related, Simvastatin, Trulicity [dulaglutide], and Fiber  Chief Complaint  Patient presents with  . New Admit To SNF    New admission to [facility] SNF    HPI: Patient is a 78 y.o. female with hip pain who had failed conservative management, obesity, chronic kidney disease, atrial fibrillation, depression, hyperlipidemia, hypertension, osteoporosis, Sjogren's disease, diabetes mellitus type 2, who had a planned admission to [facility] from 11/9-13 for total hip arthroplasty left.  Postop care was unremarkable with exception of urinary retention with a failed voiding trial and patient was discharged to skilled nursing facility with a Foley catheter.  While at skilled nursing facility patient will be followed for neuropathy treated with Neurontin, hypertension treated with Lasix, metoprolol, and atrial fibrillation treated with metoprolol and Eliquis.  Past Medical History:  Diagnosis Date  . Anemia   . 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 Anxiety   . Arthritis   . Atrial fibrillation ([place])   . Avascular necrosis (HCC) hip left   leg  pain also  . Chronic back pain   . Chronic kidney disease (CKD), stage III (moderate)   . Depression   . Diverticulosis   . Early cataracts, bilateral   . Esophageal stricture   . Gastritis   . GERD (gastroesophageal reflux disease)   . Glaucoma   . Heart murmur     " some doctors say that I have one some say that I don"t "  . Hiatal hernia   . History of blood transfusion    "@ least w/1st knee OR"  . History of gout   . History of kidney stones   . Hyperlipidemia   . Hypertension   . Intestinal obstruction ([place])   . Kidney stones   . Obesity   . Osteoarthritis   . Osteoporosis   . Pericarditis 2016  . Presence of permanent cardiac pacemaker   . Sjogren's disease ([place])   . Thyroid disease   . Type II diabetes mellitus ([place])   . Vitamin B12 deficiency     Past Surgical History:  Procedure Laterality Date  . ABDOMINAL HYSTERECTOMY     complete  . CHOLECYSTECTOMY OPEN    . COLECTOMY     for rectovaginal fistula  . COLONOSCOPY  01/07/2012   Procedure: COLONOSCOPY;  Surgeon: Lafayette Dragon, MD;  Location: [facility];  Service: Endoscopy;  Laterality: N/A;  . EP IMPLANTABLE DEVICE N/A 06/18/2015   Procedure: Pacemaker Implant;  Surgeon: Evans Lance, MD;  Location: [facility];  Service: Cardiovascular;  Laterality: N/A;  . ESOPHAGOGASTRODUODENOSCOPY N/A 11/24/2018   Procedure: ESOPHAGOGASTRODUODENOSCOPY (EGD);  Surgeon: Yetta Flock, MD;  Location: [facility];  Service: Gastroenterology;  Laterality: N/A;  . ESOPHAGOGASTRODUODENOSCOPY (EGD) WITH ESOPHAGEAL DILATION    . ESOPHAGOGASTRODUODENOSCOPY (EGD) WITH PROPOFOL N/A 08/12/2015   Procedure: ESOPHAGOGASTRODUODENOSCOPY (EGD) WITH PROPOFOL;  Surgeon: Manus Gunning, MD;  Location: [facility];  Service: Gastroenterology;  Laterality: N/A;  . EYE SURGERY Bilateral    cataracts  . HOT HEMOSTASIS N/A 11/24/2018   Procedure: HOT HEMOSTASIS (ARGON PLASMA COAGULATION/BICAP);  Surgeon: Yetta Flock, MD;  Location: [facility];  Service: Gastroenterology;  Laterality: N/A;  . I&D EXTREMITY Right 08/06/2016   Procedure: DEBRIDEMENT PIP RIGHT RING FINGER;  Surgeon: Daryll Brod, MD;  Location: [facility];  Service: Orthopedics;  Laterality: Right;  . INSERT / REPLACE / REMOVE PACEMAKER    . JOINT REPLACEMENT    . KNEE ARTHROSCOPY Right   . MASS EXCISION Right 08/06/2016   Procedure: EXCISION CYST;  Surgeon: Daryll Brod, MD;  Location: [facility];  Service: Orthopedics;  Laterality: Right;  . REVISION TOTAL KNEE ARTHROPLASTY Left   . TOTAL KNEE ARTHROPLASTY Bilateral     Allergies as of 05/08/2019      Reactions   Norvasc [amlodipine Besylate] Shortness Of Breath   Avapro [irbesartan]    Headaches   Glucophage [metformin Hydrochloride] Other (See Comments)   "increases creatinine"   Lisinopril    Headache   Losartan    Headache   Morphine And Related Other (See Comments)   "hallucinations"   Simvastatin Other (See Comments)   Body aches   Trulicity [dulaglutide] Other (See Comments)   Severe mood swings   Fiber Rash   Pt voiced she is allergic to micro fiber materials/ blankets      Medication List       Accurate as of May 08, 2019  2:32 PM. If you have any questions, ask your nurse or doctor.        STOP taking these medications   denosumab 60 MG/ML Soln injection Commonly known as: PROLIA Stopped by: Inocencio Homes, MD   oxyCODONE-acetaminophen 7.5-325 MG tablet Commonly known as: PERCOCET Stopped by: Inocencio Homes, MD   polyethylene glycol 17 g packet Commonly known as: MIRALAX / GLYCOLAX Stopped by: Inocencio Homes, MD   REFRESH OP Stopped by: Inocencio Homes, MD   sucralfate 1 GM/10ML suspension Commonly known as: CARAFATE Stopped by: Inocencio Homes, MD     TAKE these medications   Accu-Chek Aviva Plus test strip Generic drug: glucose blood 1 each by Other route daily.   acetaminophen 650 MG CR tablet Commonly known as: TYLENOL Take 1,300 mg by  mouth at bedtime as needed for pain.   dorzolamide 2 % ophthalmic solution Commonly known as: TRUSOPT Place 1 drop into the right eye 2 (two) times daily.   Eliquis 5 MG Tabs tablet Generic drug: apixaban TAKE ONE TABLET TWICE DAILY   febuxostat 40 MG tablet Commonly known as: ULORIC Take 40 mg by mouth every Monday, Wednesday, and Friday.   ferrous sulfate 325 (65 FE) MG EC tablet Take 1 tablet (325 mg total) by mouth 2 (two) times daily.   furosemide 40 MG tablet Commonly known as: LASIX Take 40 mg daily by mouth.   gabapentin 300 MG capsule Commonly known as: NEURONTIN Take 300 mg by mouth daily.   gabapentin 400 MG capsule Commonly known as: NEURONTIN Take 400 mg by mouth at bedtime.   HYDROcodone-acetaminophen 5-325 MG tablet Commonly known as: Norco Take 1 tablet by mouth at bedtime. Started by: Inocencio Homes, MD   Januvia 50 MG tablet Generic drug: sitaGLIPtin  Take 50 mg by mouth daily.   levothyroxine 50 MCG tablet Commonly known as: SYNTHROID Take 50 mcg by mouth at bedtime.   metoprolol succinate 25 MG 24 hr tablet Commonly known as: TOPROL-XL TAKE ONE AND ONE-HALF TABLETS DAILY   montelukast 10 MG tablet Commonly known as: SINGULAIR Take 10 mg by mouth at bedtime.   multivitamin with minerals Tabs tablet Take 1 tablet by mouth daily.   nystatin powder Generic drug: nystatin Apply topically 2 (two) times daily. Apply to left abdominal fold and groin   ondansetron 4 MG disintegrating tablet Commonly known as: ZOFRAN-ODT Take 4 mg by mouth every 6 (six) hours.   oxyCODONE 5 MG immediate release tablet Commonly known as: Roxicodone Take 1 tablet (5 mg total) by mouth every 6 (six) hours as needed for severe pain.   pantoprazole 40 MG tablet Commonly known as: PROTONIX Take 1 tablet (40 mg total) by mouth daily.   pentoxifylline 400 MG CR tablet Commonly known as: TRENTAL Take 400 mg by mouth daily.   PROCRIT IJ Inject 1 Dose as  directed every 6 (six) weeks.   rosuvastatin 5 MG tablet Commonly known as: CRESTOR Take 5 mg by mouth daily. Start taking on: May 09, 2019   sennosides-docusate sodium 8.6-50 MG tablet Commonly known as: SENOKOT-S Take 2 tablets by mouth daily.   Toujeo SoloStar 300 UNIT/ML Sopn Generic drug: Insulin Glargine (1 Unit Dial) Inject 16 Units into the skin every evening.   traZODone 50 MG tablet Commonly known as: DESYREL Take 25 mg by mouth at bedtime. 0.5 tablet to = 25 mg       Meds ordered this encounter  Medications  . HYDROcodone-acetaminophen (NORCO) 5-325 MG tablet    Sig: Take 1 tablet by mouth at bedtime.    Dispense:  10 tablet    Refill:  0    Immunization History  Administered Date(s) Administered  . Influenza, High Dose Seasonal PF 03/23/2016, 03/16/2018, 03/07/2019  . Influenza-Unspecified 03/23/2015, 04/05/2017  . Zoster Recombinat (Shingrix) 02/01/2018    Social History   Tobacco Use  . Smoking status: Former Smoker    Packs/day: 2.00    Years: 3.00    Pack years: 6.00    Types: Cigarettes    Quit date: 06/23/1983    Years since quitting: 35.8  . Smokeless tobacco: Never Used  Substance Use Topics  . Alcohol use: Yes    Alcohol/week: 2.0 standard drinks    Types: 2 Glasses of wine per week    Comment: occ    Family history is   Family History  Problem Relation Age of Onset  . Diabetes Mother   . Diabetes Brother   . Hypertension Sister   . Colon cancer Neg Hx   . Heart attack Neg Hx   . Stroke Neg Hx   . Colon polyps Neg Hx   . Esophageal cancer Neg Hx   . Rectal cancer Neg Hx   . Stomach cancer Neg Hx       Review of Systems  DATA OBTAINED: from patient, nurse GENERAL:  no fevers, fatigue, appetite changes SKIN: No itching, or rash EYES: No eye pain, redness, discharge EARS: No earache, tinnitus, change in hearing NOSE: No congestion, drainage or bleeding  MOUTH/THROAT: No mouth or tooth pain, No sore throat  RESPIRATORY: No cough, wheezing, SOB CARDIAC: No chest pain, palpitations, lower extremity edema  GI: No abdominal pain, No N/V/D or constipation, No heartburn or reflux  GU: No dysuria, frequency  or urgency, or incontinence  MUSCULOSKELETAL: No unrelieved bone/joint pain NEUROLOGIC: No headache, dizziness or focal weakness PSYCHIATRIC: No c/o anxiety or sadness   Vitals:   05/08/19 1406  BP: 140/68  Pulse: 70  Resp: 18  Temp: 97.7 F (36.5 C)  SpO2: 97%    SpO2 Readings from Last 1 Encounters:  05/08/19 97%   Body mass index is 40.71 kg/m.     Physical Exam  GENERAL APPEARANCE: Alert, conversant,  No acute distress.  SKIN: Incision without excessive he, no redness HEAD: Normocephalic, atraumatic  EYES: Conjunctiva/lids clear. Pupils round, reactive. EOMs intact.  EARS: External exam WNL, canals clear. Hearing grossly normal.  NOSE: No deformity or discharge.  MOUTH/THROAT: Lips w/o lesions  RESPIRATORY: Breathing is even, unlabored. Lung sounds are clear   CARDIOVASCULAR: Heart RRR no murmurs, rubs or gallops. No peripheral edema.   GASTROINTESTINAL: Abdomen is soft, non-tender, not distended w/ normal bowel sounds. GENITOURINARY: Bladder non tender, not distended  MUSCULOSKELETAL: No abnormal joints or musculature NEUROLOGIC:  Cranial nerves 2-12 grossly intact. Moves all extremities  PSYCHIATRIC: Mood and affect appropriate to situation, no behavioral issues  Patient Active Problem List   Diagnosis Date Noted  . GAVE (gastric antral vascular ectasia)   . Avascular necrosis of bone of hip, left ([place]) 08/22/2018  . Vitamin B12 deficiency   . Type II diabetes mellitus ([place])   . Sjogren's disease ([place])   . Presence of permanent cardiac pacemaker   . Pericarditis   . Osteoarthritis   . Obesity   . Kidney stones   . Intestinal obstruction ([place])   . History of gout   . Hiatal hernia   . History of blood transfusion   . Heart murmur   . GERD (gastroesophageal  reflux disease)   . Gastritis   . Esophageal stricture   . Early cataracts, bilateral   . Diverticulosis   . Depression   . Chronic kidney disease (CKD), stage III (moderate)   . Chronic back pain   . Atrial fibrillation ([place])   . Arthritis   . Anxiety   . Anemia   . 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 Chest pain at rest   . Heme positive stool   . UTI (urinary tract infection) 08/09/2015  . Acute pericarditis   . Chest pain 08/06/2015  . Diabetes mellitus type 2, controlled ([place]) 08/06/2015  . Hyperlipidemia 08/06/2015  . Hypertension 08/06/2015  . CKD (chronic kidney disease) stage 3, GFR 30-59 ml/min 08/06/2015  . Chronic anemia 08/06/2015  . Hyperkalemia   . Pain in the chest   . Pacemaker   . 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 Osteoporosis 03/22/2015  . Abdominal pain, left lower quadrant 01/07/2012  . Syncope 09/15/2011  . Junctional escape rhythm 09/15/2011  . PELVIC MASS 03/27/2009  . ABSCESS OF INTESTINE 02/19/2009  . VITAMIN B12 DEFICIENCY 11/21/2007  . OBESITY 11/21/2007  . ESOPHAGEAL STRICTURE 11/21/2007  . HIATAL HERNIA 11/21/2007  . UNSPECIFIED INTESTINAL OBSTRUCTION 11/21/2007  . DIVERTICULOSIS OF COLON 11/21/2007  . OSTEOARTHRITIS 11/21/2007  . DIABETES MELLITUS, HX OF 11/21/2007  . ANEMIA, HX OF 11/21/2007  . HYPERTENSION, HX OF 11/21/2007  . REFLUX ESOPHAGITIS, HX OF 11/21/2007      Labs reviewed: Basic Metabolic Panel:    Component Value Date/Time   NA 138 02/21/2019 1112   NA 141 10/20/2016 1321   K 4.4 02/21/2019 1112   K 4.9 10/20/2016 1321   CL 98 02/21/2019 1112   CO2 30 02/21/2019  1112   CO2 28 10/20/2016 1321   GLUCOSE 144 (H) 02/21/2019 1112   GLUCOSE 179 (H) 10/20/2016 1321   BUN 61 (H) 02/21/2019 1112   BUN 57.3 (H) 10/20/2016 1321   CREATININE 2.11 (H) 02/21/2019 1112   CREATININE 1.6 (H) 10/20/2016 1321   CALCIUM 10.0 02/21/2019 1112   CALCIUM 9.4 10/20/2016 1321   PROT 6.1 (L) 07/05/2018 1337   PROT 6.7  10/20/2016 1321   ALBUMIN 3.3 (L) 07/05/2018 1337   ALBUMIN 3.5 10/20/2016 1321   AST 12 (L) 07/05/2018 1337   AST 15 10/20/2016 1321   ALT 8 07/05/2018 1337   ALT 10 10/20/2016 1321   ALKPHOS 59 07/05/2018 1337   ALKPHOS 58 10/20/2016 1321   BILITOT 0.3 07/05/2018 1337   BILITOT 0.35 10/20/2016 1321   GFRNONAA 22 (L) 02/21/2019 1112   GFRAA 26 (L) 02/21/2019 1112    Recent Labs    07/05/18 1337 02/21/19 1112  NA 140 138  K 4.4 4.4  CL 104 98  CO2 28 30  GLUCOSE 159* 144*  BUN 62* 61*  CREATININE 1.69* 2.11*  CALCIUM 8.9 10.0   Liver Function Tests: Recent Labs    07/05/18 1337  AST 12*  ALT 8  ALKPHOS 59  BILITOT 0.3  PROT 6.1*  ALBUMIN 3.3*   No results for input(s): LIPASE, AMYLASE in the last 8760 hours. No results for input(s): AMMONIA in the last 8760 hours. CBC: Recent Labs    02/21/19 1112 03/23/19 1200 04/24/19 1049  WBC 8.2 9.9 8.2  NEUTROABS 7.1 8.8* 7.0  HGB 10.0* 10.4* 9.9*  HCT 32.2* 33.2* 31.2*  MCV 96.7 97.4 98.7  PLT 280 265 263   Lipid No results for input(s): CHOL, HDL, LDLCALC, TRIG in the last 8760 hours.  Cardiac Enzymes: No results for input(s): CKTOTAL, CKMB, CKMBINDEX, TROPONINI in the last 8760 hours. BNP: No results for input(s): BNP in the last 8760 hours. No results found for: [facility] Lab Results  Component Value Date   HGBA1C 8.2 (H) 06/17/2015   Lab Results  Component Value Date   TSH 3.10 03/22/2015   Lab Results  Component Value Date   VITAMINB12 387 08/16/2018   No results found for: FOLATE Lab Results  Component Value Date   IRON 60 10/11/2018   TIBC 349 10/11/2018   FERRITIN 44 10/11/2018    Imaging and Procedures obtained prior to SNF admission: No results found.   Not all labs, radiology exams or other studies done during hospitalization come through on my EPIC note; however they are reviewed by me.    Assessment and Plan  Osteoarthritis left hip/left hip arthroplasty-postop course  unremarkable SNF-admitted for OT/PT; prophylaxed with Eliquis 5 mg twice daily used for atrial fibrillation  Urinary retention-patient failed voiding trial in hospital; patient discharged with Foley catheter SNF-voiding trial per urology  Polyneuropathy SNF-appears controlled; continue Neurontin 300 mg every morning and 400 mg nightly  Hypertension SNF-controlled; continue Lasix 40 mg daily and metoprolol XL 25 mg daily  Atrial fibrillation SNF-rate control with metoprolol XL 25 mg daily and prophylaxed with Eliquis 5 mg twice daily  Diabetes mellitus type 2 SNF-not stated as uncontrolled; continue Toujeo 16 units nightly, and Januvia 50 mg daily; patient is on statin   Time spent greater than 45 minutes;> 50% of time with patient was spent reviewing records, labs, tests and studies, counseling and developing plan of care  Hennie Duos, MD

## 2019-05-11 ENCOUNTER — Encounter: Payer: Self-pay | Admitting: Internal Medicine

## 2019-05-11 ENCOUNTER — Non-Acute Institutional Stay (SKILLED_NURSING_FACILITY): Payer: Medicare Other | Admitting: Internal Medicine

## 2019-05-11 DIAGNOSIS — Z96642 Presence of left artificial hip joint: Secondary | ICD-10-CM

## 2019-05-11 DIAGNOSIS — D649 Anemia, unspecified: Secondary | ICD-10-CM | POA: Diagnosis not present

## 2019-05-11 DIAGNOSIS — I10 Essential (primary) hypertension: Secondary | ICD-10-CM

## 2019-05-11 DIAGNOSIS — E1122 Type 2 diabetes mellitus with diabetic chronic kidney disease: Secondary | ICD-10-CM | POA: Diagnosis not present

## 2019-05-11 DIAGNOSIS — N183 Chronic kidney disease, stage 3 unspecified: Secondary | ICD-10-CM | POA: Diagnosis not present

## 2019-05-11 DIAGNOSIS — I4891 Unspecified atrial fibrillation: Secondary | ICD-10-CM

## 2019-05-11 NOTE — Progress Notes (Signed)
Location:   [facility] Room Number: 100/F Place of Service:  SNF [phone])  Provider: Lorne Skeens  PCP: Hennie Duos, MD Patient Care Team: Hennie Duos, MD as PCP - General (Internal Medicine) Evans Lance, MD as PCP - Electrophysiology (Cardiology)  Extended Emergency Contact Information Primary Emergency Contact: [facility] Address: [place], [place] [address] Johnnette Litter of [place] Phone: [phone] Mobile Phone: [phone] Relation: Daughter Secondary Emergency Contact: Osker Mason, [place] [address] Johnnette Litter of [place] Phone: [phone] Mobile Phone: [phone] Relation: Son  Code Status: Full Code Goals of care:  Advanced Directive information Advanced Directives 05/11/2019  Does Patient Have a Medical Advance Directive? Yes  Type of Advance Directive (No Data)  Does patient want to make changes to medical advance directive? No - Patient declined  Would patient like information on creating a medical advance directive? -  Pre-existing out of facility DNR order (yellow form or pink MOST form) -     Allergies  Allergen Reactions   Norvasc [Amlodipine Besylate] Shortness Of Breath   Avapro [Irbesartan]     Headaches   Glucophage [Metformin Hydrochloride] Other (See Comments)    "increases creatinine"   Lisinopril     Headache    Losartan     Headache    Morphine And Related Other (See Comments)    "hallucinations"   Simvastatin Other (See Comments)    Body aches   Trulicity [Dulaglutide] Other (See Comments)    Severe mood swings   Fiber Rash    Pt voiced she is allergic to micro fiber materials/ blankets    Chief Complaint  Patient presents with   Discharge Note    Discharge Visit    HPI:  78 y.o. female seen today for discharge from facility this Saturday.  November 21.  Patient has a history of a left hip replacement which  appears to been tolerated well she has been here for rehab and she is looking forward to going home she says she will have someone with her.  She will need continued PT and OT.  She is now using a walker and feels she is doing well.  Her other medical issues include postop anemia she is on iron in addition to chronic anemia --she receives Procrit shots apparently every 6 months-hemoglobin has shown stability at 8.7 which is comparable to her discharge hemoglobin --she also has a history of hypertension  on Lasix -she does have a history of type 2 diabetes-- hemoglobin A1c has been less than 6 she is on Januvia 50 mg a day and Toujeo 16 units a day.  Blood sugars have been stable per nursing.  Other diagnoses include hypothyroidism she is on Synthroid and atrial fibrillation she continues on Eliquis-she is also on metoprolol for rate control this appears to be stable.    She does have a history of pacemaker with a history of bradycardia this has been stable during her stay   She also has a history of chronic kidney disease which appears stable with a creatinine of 1.36 on a lab done earlier this week        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   Anxiety    Arthritis    Atrial fibrillation ([place])    Avascular necrosis (HCC) hip  left   leg pain also   Chronic back pain    Chronic kidney disease (CKD), stage III (moderate)    Depression    Diverticulosis    Early cataracts, bilateral    Esophageal stricture    Gastritis    GERD (gastroesophageal reflux disease)    Glaucoma    Heart murmur     " some doctors say that I have one some say that I don"t "   Hiatal hernia    History of blood transfusion    "@ least w/1st knee OR"   History of gout    History of kidney stones    Hyperlipidemia    Hypertension    Intestinal obstruction ([place])    Kidney stones    Obesity    Osteoarthritis     Osteoporosis    Pericarditis 2016   Presence of permanent cardiac pacemaker    Sjogren's disease ([place])    Thyroid disease    Type II diabetes mellitus ([place])    Vitamin B12 deficiency     Past Surgical History:  Procedure Laterality Date   ABDOMINAL HYSTERECTOMY     complete   CHOLECYSTECTOMY OPEN     COLECTOMY     for rectovaginal fistula   COLONOSCOPY  01/07/2012   Procedure: COLONOSCOPY;  Surgeon: Lafayette Dragon, MD;  Location: [facility];  Service: Endoscopy;  Laterality: N/A;   EP IMPLANTABLE DEVICE N/A 06/18/2015   Procedure: Pacemaker Implant;  Surgeon: Evans Lance, MD;  Location: [facility];  Service: Cardiovascular;  Laterality: N/A;   ESOPHAGOGASTRODUODENOSCOPY N/A 11/24/2018   Procedure: ESOPHAGOGASTRODUODENOSCOPY (EGD);  Surgeon: Yetta Flock, MD;  Location: [facility];  Service: Gastroenterology;  Laterality: N/A;   ESOPHAGOGASTRODUODENOSCOPY (EGD) WITH ESOPHAGEAL DILATION     ESOPHAGOGASTRODUODENOSCOPY (EGD) WITH PROPOFOL N/A 08/12/2015   Procedure: ESOPHAGOGASTRODUODENOSCOPY (EGD) WITH PROPOFOL;  Surgeon: Manus Gunning, MD;  Location: [facility];  Service: Gastroenterology;  Laterality: N/A;   EYE SURGERY Bilateral    cataracts   HOT HEMOSTASIS N/A 11/24/2018   Procedure: HOT HEMOSTASIS (ARGON PLASMA COAGULATION/BICAP);  Surgeon: Yetta Flock, MD;  Location: [facility];  Service: Gastroenterology;  Laterality: N/A;   I&D EXTREMITY Right 08/06/2016   Procedure: DEBRIDEMENT PIP RIGHT RING FINGER;  Surgeon: Daryll Brod, MD;  Location: [facility];  Service: Orthopedics;  Laterality: Right;   INSERT / REPLACE / REMOVE PACEMAKER     JOINT REPLACEMENT     KNEE ARTHROSCOPY Right    MASS EXCISION Right 08/06/2016   Procedure: EXCISION CYST;  Surgeon: Daryll Brod, MD;  Location: [facility];  Service: Orthopedics;  Laterality: Right;   REVISION TOTAL KNEE ARTHROPLASTY Left    TOTAL KNEE ARTHROPLASTY Bilateral       reports  that she quit smoking about 35 years ago. Her smoking use included cigarettes. She has a 6.00 pack-year smoking history. She has never used smokeless tobacco. She reports current alcohol use of about 2.0 standard drinks of alcohol per week. She reports that she does not use drugs. Social History   Socioeconomic History   Marital status: Divorced    Spouse name: Not on file   Number of children: 2   Years of education: Not on file   Highest education level: Not on file  Occupational History   Not on file  Social Needs   Financial resource strain: Not on file   Food insecurity    Worry: Not on file    Inability: Not on file  Transportation needs    Medical: Not on file    Non-medical: Not on file  Tobacco Use   Smoking status: Former Smoker    Packs/day: 2.00    Years: 3.00    Pack years: 6.00    Types: Cigarettes    Quit date: 06/23/1983    Years since quitting: 35.9   Smokeless tobacco: Never Used  Substance and Sexual Activity   Alcohol use: Yes    Alcohol/week: 2.0 standard drinks    Types: 2 Glasses of wine per week    Comment: occ   Drug use: No   Sexual activity: Never  Lifestyle   Physical activity    Days per week: Not on file    Minutes per session: Not on file   Stress: Not on file  Relationships   Social connections    Talks on phone: Not on file    Gets together: Not on file    Attends religious service: Not on file    Active member of club or organization: Not on file    Attends meetings of clubs or organizations: Not on file    Relationship status: Not on file   Intimate partner violence    Fear of current or ex partner: Not on file    Emotionally abused: Not on file    Physically abused: Not on file    Forced sexual activity: Not on file  Other Topics Concern   Not on file  Social History Narrative   Not on file   Functional Status Survey:    Allergies  Allergen Reactions   Norvasc [Amlodipine Besylate] Shortness Of  Breath   Avapro [Irbesartan]     Headaches   Glucophage [Metformin Hydrochloride] Other (See Comments)    "increases creatinine"   Lisinopril     Headache    Losartan     Headache    Morphine And Related Other (See Comments)    "hallucinations"   Simvastatin Other (See Comments)    Body aches   Trulicity [Dulaglutide] Other (See Comments)    Severe mood swings   Fiber Rash    Pt voiced she is allergic to micro fiber materials/ blankets    Pertinent  Health Maintenance Due  Topic Date Due   FOOT EXAM  07/12/1950   OPHTHALMOLOGY EXAM  07/12/1950   URINE MICROALBUMIN  07/12/1950   PNA vac Low Risk Adult (1 of 2 - PCV13) 07/11/2006   HEMOGLOBIN A1C  12/16/2015   INFLUENZA VACCINE  Completed   DEXA SCAN  Completed    Medications: Allergies as of 05/11/2019      Reactions   Norvasc [amlodipine Besylate] Shortness Of Breath   Avapro [irbesartan]    Headaches   Glucophage [metformin Hydrochloride] Other (See Comments)   "increases creatinine"   Lisinopril    Headache   Losartan    Headache   Morphine And Related Other (See Comments)   "hallucinations"   Simvastatin Other (See Comments)   Body aches   Trulicity [dulaglutide] Other (See Comments)   Severe mood swings   Fiber Rash   Pt voiced she is allergic to micro fiber materials/ blankets      Medication List       Accurate as of May 11, 2019  4:52 PM. If you have any questions, ask your nurse or doctor.        STOP taking these medications   metoprolol succinate 25 MG 24 hr tablet Commonly known as: TOPROL-XL Stopped by: Alene Mires  Dennice Tindol, PA-C   ondansetron 4 MG disintegrating tablet Commonly known as: ZOFRAN-ODT Stopped by: Granville Lewis, PA-C   oxyCODONE 5 MG immediate release tablet Commonly known as: Roxicodone Stopped by: Granville Lewis, PA-C     TAKE these medications   Accu-Chek Aviva Plus test strip Generic drug: glucose blood 1 each by Other route daily.   acetaminophen 650  MG CR tablet Commonly known as: TYLENOL Take 1,300 mg by mouth at bedtime as needed for pain.   dorzolamide 2 % ophthalmic solution Commonly known as: TRUSOPT Place 1 drop into the right eye 2 (two) times daily.   Eliquis 5 MG Tabs tablet Generic drug: apixaban TAKE ONE TABLET TWICE DAILY   febuxostat 40 MG tablet Commonly known as: ULORIC Take 40 mg by mouth every Monday, Wednesday, and Friday.   ferrous sulfate 325 (65 FE) MG EC tablet Take 1 tablet (325 mg total) by mouth 2 (two) times daily.   furosemide 40 MG tablet Commonly known as: LASIX Take 40 mg daily by mouth.   gabapentin 300 MG capsule Commonly known as: NEURONTIN Take 300 mg by mouth daily.   gabapentin 400 MG capsule Commonly known as: NEURONTIN Take 400 mg by mouth at bedtime.   HYDROcodone-acetaminophen 5-325 MG tablet Commonly known as: Norco Take 1 tablet by mouth at bedtime.   Januvia 50 MG tablet Generic drug: sitaGLIPtin Take 50 mg by mouth daily.   levothyroxine 50 MCG tablet Commonly known as: SYNTHROID Take 50 mcg by mouth at bedtime.   metoprolol tartrate 25 MG tablet Commonly known as: LOPRESSOR Take 25 mg by mouth daily.   montelukast 10 MG tablet Commonly known as: SINGULAIR Take 10 mg by mouth at bedtime.   multivitamin with minerals Tabs tablet Take 1 tablet by mouth daily.   Nyamyc powder Generic drug: nystatin APPLY TOPICALLY TO LEFT ABDOMINAL FOLD AND GROIN TWICE A DAY What changed: Another medication with the same name was removed. Continue taking this medication, and follow the directions you see here. Changed by: Granville Lewis, PA-C   pantoprazole 40 MG tablet Commonly known as: PROTONIX Take 1 tablet (40 mg total) by mouth daily.   pentoxifylline 400 MG CR tablet Commonly known as: TRENTAL Take 400 mg by mouth daily.   PROCRIT IJ Inject 1 Dose as directed every 6 (six) weeks.   rosuvastatin 5 MG tablet Commonly known as: CRESTOR Take 5 mg by mouth daily.     sennosides-docusate sodium 8.6-50 MG tablet Commonly known as: SENOKOT-S Take 1 tablet by mouth daily.   Toujeo SoloStar 300 UNIT/ML Sopn Generic drug: Insulin Glargine (1 Unit Dial) Inject 16 Units into the skin every evening.   traZODone 50 MG tablet Commonly known as: DESYREL Take 25 mg by mouth at bedtime. 0.5 tablet to = 25 mg       Review of Systems   General she is not complaining of any fever or chills.  Skin does not complain of rashes or itching surgical site is currently covered and followed by surgery.  Head ears eyes nose mouth and throat is not complaining of visual changes or sore throat.  Respiratory denies shortness of breath or cough.  She says she has at times shortness of breath on exertion but this is nothing new.  Cardiac is not complaining of chest pain or increased edema from baseline.  GI is not complaining of abdominal pain nausea or vomiting says at times she has constipation-Per nursing she is having regular bowel movements.  GU is  not complaining of dysuria.  Musculoskeletal this point says any pain is controlled.  Neurologic does not complain of dizziness headache numbness or syncope.  Psych does not complain being depressed or anxious she is looking forward to going home.      Vitals:   05/11/19 1650  BP: 130/72  Pulse: 72  Resp: 18  Temp: (!) 97.1 F (36.2 C)  TempSrc: Oral  SpO2: 97%  Weight: 195 lb 9.6 oz (88.7 kg)  Height: _0  (1.448 m)   Body mass index is 42.33 kg/m. Physical Exam In general this is a pleasant elderly female in no distress.  Her skin is warm and dry surgical site left hip is currently covered.  Eyes visual acuity appears to be intact sclera and conjunctive are clear.  Oropharynx is clear mucous membranes moist.  Chest is clear to auscultation there is no labored breathing.  Heart is regular rate and rhythm without murmur gallop or rub she has trace lower extremity edema pedal pulses are intact  bilaterally.  Abdomen is soft nontender with positive bowel sounds.  Abdomen is somewhat obese soft nontender with positive bowel sounds.  Musculoskeletal is able to move all extremities x4 she is now ambulating with a walker.  Neurologic is grossly intact cannot appreciate lateralizing findings her speech is clear.  Psych she is alert and oriented very pleasant and appropriate.   Labs reviewed:  May 10, 2019.  WBC 7.6 hemoglobin 8.7 platelets 376.  Sodium 140 potassium 4.4 BUN 41.7 creatinine 1.36.  Hemoglobin was 8.6 on November 11.   Basic Metabolic Panel: Recent Labs    07/05/18 1337 02/21/19 1112  NA 140 138  K 4.4 4.4  CL 104 98  CO2 28 30  GLUCOSE 159* 144*  BUN 62* 61*  CREATININE 1.69* 2.11*  CALCIUM 8.9 10.0   Liver Function Tests: Recent Labs    07/05/18 1337  AST 12*  ALT 8  ALKPHOS 59  BILITOT 0.3  PROT 6.1*  ALBUMIN 3.3*   No results for input(s): LIPASE, AMYLASE in the last 8760 hours. No results for input(s): AMMONIA in the last 8760 hours. CBC: Recent Labs    02/21/19 1112 03/23/19 1200 04/24/19 1049  WBC 8.2 9.9 8.2  NEUTROABS 7.1 8.8* 7.0  HGB 10.0* 10.4* 9.9*  HCT 32.2* 33.2* 31.2*  MCV 96.7 97.4 98.7  PLT 280 265 263   Cardiac Enzymes: No results for input(s): CKTOTAL, CKMB, CKMBINDEX, TROPONINI in the last 8760 hours. BNP: Invalid input(s): POCBNP CBG: Recent Labs    11/24/18 1239  GLUCAP 138*    Procedures and Imaging Studies During Stay: No results found.  Assessment/Plan:    #1 history of left hip replacement-she appears to be doing well she will need continued PT and OT-with follow-up by surgery as needed-she does have Norco at night for pain.  2.  Anemia combination of postop as well as chronic disease this appears to be stable hemoglobin of 8.7 this will need monitoring as an outpatient-she is on iron and receivesPro crit apparently every 6 months.  3.  History of atrial fibrillation appears rate  controlled on metoprolol 25 mg a day-she is on Eliquis 5 mg twice daily for anticoagulation.  4.-History of type 2 diabetes she continues on Januvia 50 mg a day and Toujeo 16 units a day-per nursing blood sugars have been stable-CBG this morning was 71 and nursing will encourage her to eat at bedtime snacks.  5.  Hypertension she is on Lasix 40 mg  a day-  In  addition to metoprolol 25 mg a day-this appears to be stable recent blood pressures systolically have been in the 120-130  range.  6.  History of stage III chronic kidney disease as noted above creatinine appears to be stable at 1.36 this will need monitoring as an outpatient as well.  7.  History of pacemaker with bradycardia this is stable again she also has coexistent atrial fibrillation treated with metoprolol as well as Eliquis.  8.  History of hypothyroidism she is on Synthroid 50 mcg a day not stated as uncontrolled will need follow-up by primary care provider.  9.  Insomnia she is on trazodone 25 mg nightly.  10.  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  #11 history of gout continues on Febuxostat-40 mg on Monday Wednesdays and Fridays  This has been stable with no recent flares.  12.-Apparently some history of vascular issues per discussion with patient she continues on Pentoxifylline ER--400 mg p.o. daily.  Again she will be going home she will be with someone she assures me--- will need expedient follow-up by primary care provider as well as orthopedics.  [id number] note greater than 30 minutes spent on this discharge summary-greater than 50% of time spent coordinating plan of care for numerous diagnoses

## 2019-05-12 ENCOUNTER — Encounter: Payer: Self-pay | Admitting: Internal Medicine

## 2019-05-12 ENCOUNTER — Other Ambulatory Visit: Payer: Self-pay | Admitting: Internal Medicine

## 2019-05-12 DIAGNOSIS — R339 Retention of urine, unspecified: Secondary | ICD-10-CM | POA: Insufficient documentation

## 2019-05-12 DIAGNOSIS — G63 Polyneuropathy in diseases classified elsewhere: Secondary | ICD-10-CM | POA: Insufficient documentation

## 2019-05-12 MED ORDER — GABAPENTIN 300 MG PO CAPS: 300.0000 mg | ORAL_CAPSULE | Freq: Every day | ORAL | 0 refills | Status: DC

## 2019-05-12 MED ORDER — PENTOXIFYLLINE ER 400 MG PO TBCR: 400.0000 mg | EXTENDED_RELEASE_TABLET | Freq: Every day | ORAL | 0 refills | Status: DC

## 2019-05-12 MED ORDER — FEBUXOSTAT 40 MG PO TABS: 40.0000 mg | ORAL_TABLET | ORAL | 0 refills | Status: AC

## 2019-05-12 MED ORDER — PANTOPRAZOLE SODIUM 40 MG PO TBEC: 40.0000 mg | DELAYED_RELEASE_TABLET | Freq: Every day | ORAL | 0 refills | Status: DC

## 2019-05-12 MED ORDER — TRAZODONE HCL 50 MG PO TABS: 25.0000 mg | ORAL_TABLET | Freq: Every day | ORAL | 0 refills | Status: DC

## 2019-05-12 MED ORDER — HYDROCODONE-ACETAMINOPHEN 5-325 MG PO TABS: 1.0000 | ORAL_TABLET | Freq: Every day | ORAL | 0 refills | Status: DC

## 2019-05-12 MED ORDER — LEVOTHYROXINE SODIUM 50 MCG PO TABS: 50.0000 ug | ORAL_TABLET | Freq: Every day | ORAL | 0 refills | Status: DC

## 2019-05-12 MED ORDER — ROSUVASTATIN CALCIUM 5 MG PO TABS: 5.0000 mg | ORAL_TABLET | Freq: Every day | ORAL | 0 refills | Status: AC

## 2019-05-12 MED ORDER — NYSTATIN 100000 UNIT/GM EX POWD: CUTANEOUS | 0 refills | Status: AC

## 2019-05-12 MED ORDER — JANUVIA 50 MG PO TABS: 50.0000 mg | ORAL_TABLET | Freq: Every day | ORAL | 0 refills | Status: DC

## 2019-05-12 MED ORDER — GABAPENTIN 400 MG PO CAPS: 400.0000 mg | ORAL_CAPSULE | Freq: Every day | ORAL | 0 refills | Status: DC

## 2019-05-12 MED ORDER — DORZOLAMIDE HCL 2 % OP SOLN: 1.0000 [drp] | Freq: Two times a day (BID) | OPHTHALMIC | 0 refills | Status: DC

## 2019-05-12 MED ORDER — FUROSEMIDE 40 MG PO TABS: 40.0000 mg | ORAL_TABLET | Freq: Every day | ORAL | 0 refills | Status: DC

## 2019-05-12 MED ORDER — TOUJEO SOLOSTAR 300 UNIT/ML ~~LOC~~ SOPN: 16.0000 [IU] | PEN_INJECTOR | Freq: Every evening | SUBCUTANEOUS | 0 refills | Status: DC

## 2019-05-12 MED ORDER — APIXABAN 5 MG PO TABS: 5.0000 mg | ORAL_TABLET | Freq: Two times a day (BID) | ORAL | 0 refills | Status: DC

## 2019-05-12 MED ORDER — MONTELUKAST SODIUM 10 MG PO TABS: 10.0000 mg | ORAL_TABLET | Freq: Every day | ORAL | 0 refills | Status: AC

## 2019-05-12 MED ORDER — METOPROLOL TARTRATE 25 MG PO TABS: 25.0000 mg | ORAL_TABLET | Freq: Every day | ORAL | 0 refills | Status: DC

## 2019-06-22 ENCOUNTER — Inpatient Hospital Stay (HOSPITAL_BASED_OUTPATIENT_CLINIC_OR_DEPARTMENT_OTHER): Payer: Medicare Other | Admitting: Oncology

## 2019-06-22 ENCOUNTER — Other Ambulatory Visit: Payer: Self-pay

## 2019-06-22 ENCOUNTER — Inpatient Hospital Stay: Payer: Medicare Other | Attending: Oncology

## 2019-06-22 ENCOUNTER — Inpatient Hospital Stay: Payer: Medicare Other

## 2019-06-22 VITALS — BP 162/70 | HR 71 | Temp 98.2°F | Resp 18 | Ht <= 58 in | Wt 185.9 lb

## 2019-06-22 DIAGNOSIS — E1122 Type 2 diabetes mellitus with diabetic chronic kidney disease: Secondary | ICD-10-CM | POA: Diagnosis not present

## 2019-06-22 DIAGNOSIS — N189 Chronic kidney disease, unspecified: Secondary | ICD-10-CM

## 2019-06-22 DIAGNOSIS — Z96642 Presence of left artificial hip joint: Secondary | ICD-10-CM | POA: Insufficient documentation

## 2019-06-22 DIAGNOSIS — D631 Anemia in chronic kidney disease: Secondary | ICD-10-CM | POA: Diagnosis not present

## 2019-06-22 DIAGNOSIS — N183 Chronic kidney disease, stage 3 unspecified: Secondary | ICD-10-CM | POA: Insufficient documentation

## 2019-06-22 DIAGNOSIS — M1990 Unspecified osteoarthritis, unspecified site: Secondary | ICD-10-CM | POA: Diagnosis not present

## 2019-06-22 DIAGNOSIS — M3500 Sicca syndrome, unspecified: Secondary | ICD-10-CM | POA: Diagnosis not present

## 2019-06-22 DIAGNOSIS — D649 Anemia, unspecified: Secondary | ICD-10-CM

## 2019-06-22 LAB — CBC WITH DIFFERENTIAL (CANCER CENTER ONLY)
Abs Immature Granulocytes: 0.03 10*3/uL (ref 0.00–0.07)
Basophils Absolute: 0 10*3/uL (ref 0.0–0.1)
Basophils Relative: 0 %
Eosinophils Absolute: 0.1 10*3/uL (ref 0.0–0.5)
Eosinophils Relative: 1 %
HCT: 29 % — ABNORMAL LOW (ref 36.0–46.0)
Hemoglobin: 9 g/dL — ABNORMAL LOW (ref 12.0–15.0)
Immature Granulocytes: 0 %
Lymphocytes Relative: 8 %
Lymphs Abs: 0.7 10*3/uL (ref 0.7–4.0)
MCH: 30.5 pg (ref 26.0–34.0)
MCHC: 31 g/dL (ref 30.0–36.0)
MCV: 98.3 fL (ref 80.0–100.0)
Monocytes Absolute: 0.6 10*3/uL (ref 0.1–1.0)
Monocytes Relative: 7 %
Neutro Abs: 7.7 10*3/uL (ref 1.7–7.7)
Neutrophils Relative %: 84 %
Platelet Count: 261 10*3/uL (ref 150–400)
RBC: 2.95 MIL/uL — ABNORMAL LOW (ref 3.87–5.11)
RDW: 16.1 % — ABNORMAL HIGH (ref 11.5–15.5)
WBC Count: 9.1 10*3/uL (ref 4.0–10.5)
nRBC: 0 % (ref 0.0–0.2)

## 2019-06-22 MED ORDER — EPOETIN ALFA-EPBX 40000 UNIT/ML IJ SOLN
40000.0000 [IU] | Freq: Once | INTRAMUSCULAR | Status: AC
  Administered 2019-06-22: 40000 [IU] via SUBCUTANEOUS

## 2019-06-22 MED ORDER — EPOETIN ALFA-EPBX 40000 UNIT/ML IJ SOLN
INTRAMUSCULAR | Status: AC
  Filled 2019-06-22: qty 1

## 2019-06-22 NOTE — Progress Notes (Signed)
[place] [facility] OFFICE PROGRESS NOTE   Diagnosis: Anemia, renal insufficiency  INTERVAL HISTORY:   [name] returns for a scheduled visit.  She underwent left hip replacement at [facility] on 05/01/2019.  She reports resolution of left hip pain.  She is ambulating and continues physical therapy.  No bleeding.  She continues to follow a healthy diet.  Objective:  Vital signs in last 24 hours:  Blood pressure (!) 162/70, pulse 71, temperature 98.2 F (36.8 C), temperature source Temporal, resp. rate 18, height _0  (1.448 m), weight 185 lb 14.4 oz (84.3 kg), SpO2 99 %.   Physical examination not performed today secondary to distancing with the Covid pandemic  Lab Results:  Lab Results  Component Value Date   WBC 9.1 06/22/2019   HGB 9.0 (L) 06/22/2019   HCT 29.0 (L) 06/22/2019   MCV 98.3 06/22/2019   PLT 261 06/22/2019   NEUTROABS 7.7 06/22/2019    CMP  Lab Results  Component Value Date   NA 138 02/21/2019   K 4.4 02/21/2019   CL 98 02/21/2019   CO2 30 02/21/2019   GLUCOSE 144 (H) 02/21/2019   BUN 61 (H) 02/21/2019   CREATININE 2.11 (H) 02/21/2019   CALCIUM 10.0 02/21/2019   PROT 6.1 (L) 07/05/2018   ALBUMIN 3.3 (L) 07/05/2018   AST 12 (L) 07/05/2018   ALT 8 07/05/2018   ALKPHOS 59 07/05/2018   BILITOT 0.3 07/05/2018   GFRNONAA 22 (L) 02/21/2019   GFRAA 26 (L) 02/21/2019   Hemoglobin on 05/03/2019 at [facility]: 8.6 Medications: I have reviewed the patient's current medications.   Assessment/Plan: 1. 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; quantitative immunoglobulins show IgG mildly decreased 654 (range [phone]), IgA and IgM in normal range; polyclonal serum light chain elevation  11/15/2015-hemoglobin 11.2  11/29/2015-hemoglobin 11.1  12/12/2005-hemoglobin 10.4; Aranesp 100 g every 2 weeks initiated  02/07/2016-hemoglobin 11.0, Aranesp held, changed to an every 3 week schedule  Progressive anemia and decreased iron stores December 2017, treated with IV iron January 2018 with improvement  She did not require Aranesp for 2 months and resumed Aranesp on a monthly schedule for 08/2016  Aranesp every 6 weeks beginning 05/04/2017  Progressive anemia and decreased iron stores 07/05/2017; IV irongiven2/11/2017 and 08/06/2017  Persistent severe anemia, Aranesp change to every 2 weeks beginning 10/19/2017  Persistent anemia, decreased iron stores 11/30/2017  IV iron 12/13/2017, 12/20/2017,  Decreased iron stores 05/17/2018  IV iron1/08/2018  07/05/2018-no serum M spike, polyclonal serum light chain elevation, IgG mildly decreased with IgA and IgM within normal range  2 units of blood 08/17/2018  IV iron 08/17/2018 and 08/24/2018  Stool heme +08/30/2018, referred to GI  2 units of blood 09/01/2018  1 unit of blood 10/25/2018  11/11/2018 colonoscopy- diverticulosis in the entire examined colon.  End-to-end colocolonic anastomosis.  One 5 mm polyp in the ascending colon.  No cause for anemia.  11/11/2018 upper endoscopy- evidence of gastric antral vascular ectasia which is the likely cause of the patient's worsening anemia.  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  Hemoglobin stabilized following treatment of the  Gave  2. Chronic renal insufficiency  3. Atrial fibrillation-maintained on apixaban  4. Osteoarthritis  5. Diabetes  6. Sjogren's disease  7. Pericarditis February  2017  8.Colonoscopy 01/07/2012-prior segmental colectomy in the sigmoid colon; moderate diverticulosis throughout the colon; otherwise normal examination     Disposition: [name] has anemia secondary to chronic renal insufficiency and GI bleeding.  She is recovering from left hip replacement surgery.  She last received erythropoietin therapy on 04/24/2019.  She received IV iron on 04/27/2019.  The hemoglobin is lower today, likely from surgery.  Plan is to resume every 6-week erythropoietin therapy.  We will check iron levels when she returns in 6 weeks.   [name], MD  06/22/2019  11:43 AM

## 2019-06-28 ENCOUNTER — Telehealth: Payer: Self-pay

## 2019-06-28 DIAGNOSIS — Z96642 Presence of left artificial hip joint: Secondary | ICD-10-CM | POA: Insufficient documentation

## 2019-06-28 NOTE — Telephone Encounter (Signed)
Left message for patient to remind of missed remote transmission.  

## 2019-07-07 ENCOUNTER — Ambulatory Visit (INDEPENDENT_AMBULATORY_CARE_PROVIDER_SITE_OTHER): Payer: Medicare Other | Admitting: *Deleted

## 2019-07-07 DIAGNOSIS — R001 Bradycardia, unspecified: Secondary | ICD-10-CM

## 2019-07-08 LAB — CUP PACEART REMOTE DEVICE CHECK
Battery Remaining Longevity: 61 mo
Battery Voltage: 2.99 V
Brady Statistic AP VP Percent: 51.95 %
Brady Statistic AP VS Percent: 1.45 %
Brady Statistic AS VP Percent: 23.86 %
Brady Statistic AS VS Percent: 22.74 %
Brady Statistic RA Percent Paced: 52.69 %
Brady Statistic RV Percent Paced: 75.12 %
Date Time Interrogation Session: 20210115155210
Implantable Lead Implant Date: 20161227
Implantable Lead Implant Date: 20161227
Implantable Lead Location: 753859
Implantable Lead Location: 753860
Implantable Lead Model: 5076
Implantable Lead Model: 5076
Implantable Pulse Generator Implant Date: 20161227
Lead Channel Impedance Value: 323 Ohm
Lead Channel Impedance Value: 342 Ohm
Lead Channel Impedance Value: 361 Ohm
Lead Channel Impedance Value: 380 Ohm
Lead Channel Pacing Threshold Amplitude: 0.625 V
Lead Channel Pacing Threshold Amplitude: 1.25 V
Lead Channel Pacing Threshold Pulse Width: 0.4 ms
Lead Channel Pacing Threshold Pulse Width: 0.4 ms
Lead Channel Sensing Intrinsic Amplitude: 16.125 mV
Lead Channel Sensing Intrinsic Amplitude: 16.125 mV
Lead Channel Sensing Intrinsic Amplitude: 2 mV
Lead Channel Sensing Intrinsic Amplitude: 2 mV
Lead Channel Setting Pacing Amplitude: 2 V
Lead Channel Setting Pacing Amplitude: 2.75 V
Lead Channel Setting Pacing Pulse Width: 0.4 ms
Lead Channel Setting Sensing Sensitivity: 2.8 mV

## 2019-07-17 ENCOUNTER — Other Ambulatory Visit: Payer: Self-pay | Admitting: Internal Medicine

## 2019-07-28 ENCOUNTER — Telehealth: Payer: Self-pay | Admitting: Nurse Practitioner

## 2019-07-28 NOTE — Telephone Encounter (Signed)
Rescheduled per 2/5 sch msg, pt req. Called and spoke with pt, confirmed 2/11 appt

## 2019-08-01 ENCOUNTER — Ambulatory Visit: Payer: Medicare Other | Admitting: Nurse Practitioner

## 2019-08-01 ENCOUNTER — Other Ambulatory Visit: Payer: Medicare Other

## 2019-08-01 ENCOUNTER — Ambulatory Visit: Payer: Medicare Other

## 2019-08-03 ENCOUNTER — Inpatient Hospital Stay: Payer: Medicare Other | Attending: Oncology

## 2019-08-03 ENCOUNTER — Inpatient Hospital Stay (HOSPITAL_BASED_OUTPATIENT_CLINIC_OR_DEPARTMENT_OTHER): Payer: Medicare Other | Admitting: Nurse Practitioner

## 2019-08-03 ENCOUNTER — Other Ambulatory Visit: Payer: Self-pay

## 2019-08-03 ENCOUNTER — Encounter: Payer: Self-pay | Admitting: Nurse Practitioner

## 2019-08-03 ENCOUNTER — Inpatient Hospital Stay: Payer: Medicare Other

## 2019-08-03 VITALS — BP 145/50 | HR 88 | Temp 98.0°F | Resp 17 | Ht <= 58 in | Wt 181.7 lb

## 2019-08-03 DIAGNOSIS — E1122 Type 2 diabetes mellitus with diabetic chronic kidney disease: Secondary | ICD-10-CM | POA: Insufficient documentation

## 2019-08-03 DIAGNOSIS — N189 Chronic kidney disease, unspecified: Secondary | ICD-10-CM

## 2019-08-03 DIAGNOSIS — M3500 Sicca syndrome, unspecified: Secondary | ICD-10-CM | POA: Diagnosis not present

## 2019-08-03 DIAGNOSIS — D631 Anemia in chronic kidney disease: Secondary | ICD-10-CM

## 2019-08-03 DIAGNOSIS — I4891 Unspecified atrial fibrillation: Secondary | ICD-10-CM | POA: Diagnosis not present

## 2019-08-03 DIAGNOSIS — M1990 Unspecified osteoarthritis, unspecified site: Secondary | ICD-10-CM | POA: Diagnosis not present

## 2019-08-03 DIAGNOSIS — N183 Chronic kidney disease, stage 3 unspecified: Secondary | ICD-10-CM

## 2019-08-03 DIAGNOSIS — Z791 Long term (current) use of non-steroidal anti-inflammatories (NSAID): Secondary | ICD-10-CM | POA: Insufficient documentation

## 2019-08-03 LAB — CBC WITH DIFFERENTIAL (CANCER CENTER ONLY)
Abs Immature Granulocytes: 0.03 10*3/uL (ref 0.00–0.07)
Basophils Absolute: 0 10*3/uL (ref 0.0–0.1)
Basophils Relative: 0 %
Eosinophils Absolute: 0 10*3/uL (ref 0.0–0.5)
Eosinophils Relative: 0 %
HCT: 29.4 % — ABNORMAL LOW (ref 36.0–46.0)
Hemoglobin: 9.3 g/dL — ABNORMAL LOW (ref 12.0–15.0)
Immature Granulocytes: 0 %
Lymphocytes Relative: 7 %
Lymphs Abs: 0.5 10*3/uL — ABNORMAL LOW (ref 0.7–4.0)
MCH: 31.3 pg (ref 26.0–34.0)
MCHC: 31.6 g/dL (ref 30.0–36.0)
MCV: 99 fL (ref 80.0–100.0)
Monocytes Absolute: 0.5 10*3/uL (ref 0.1–1.0)
Monocytes Relative: 7 %
Neutro Abs: 6.6 10*3/uL (ref 1.7–7.7)
Neutrophils Relative %: 86 %
Platelet Count: 267 10*3/uL (ref 150–400)
RBC: 2.97 MIL/uL — ABNORMAL LOW (ref 3.87–5.11)
RDW: 15 % (ref 11.5–15.5)
WBC Count: 7.7 10*3/uL (ref 4.0–10.5)
nRBC: 0 % (ref 0.0–0.2)

## 2019-08-03 MED ORDER — EPOETIN ALFA-EPBX 40000 UNIT/ML IJ SOLN
40000.0000 [IU] | Freq: Once | INTRAMUSCULAR | Status: AC
  Administered 2019-08-03: 40000 [IU] via SUBCUTANEOUS

## 2019-08-03 MED ORDER — EPOETIN ALFA-EPBX 40000 UNIT/ML IJ SOLN
INTRAMUSCULAR | Status: AC
  Filled 2019-08-03: qty 1

## 2019-08-03 NOTE — Progress Notes (Signed)
[place] OFFICE PROGRESS NOTE   Diagnosis: Anemia, renal insufficiency  INTERVAL HISTORY:   Tricia Gonzales returns as scheduled.  She had an erythropoietin injection on 06/22/2019.  She continues working with physical therapy.  She is ambulating with a walker.  She feels she is getting stronger.  She denies bleeding.  She is taking oral iron twice daily.  Objective:  Vital signs in last 24 hours:  Blood pressure (!) 145/50, pulse 88, temperature 98 F (36.7 C), temperature source Temporal, resp. rate 17, height _0  (1.448 m), weight 181 lb 11.2 oz (82.4 kg), SpO2 98 %.   HEENT: No thrush or ulcers. GI: Abdomen soft and nontender.  No hepatosplenomegaly. Vascular: Trace edema at the lower legs bilaterally.  Lab Results:  Lab Results  Component Value Date   WBC 7.7 08/03/2019   HGB 9.3 (L) 08/03/2019   HCT 29.4 (L) 08/03/2019   MCV 99.0 08/03/2019   PLT 267 08/03/2019   NEUTROABS 6.6 08/03/2019    Imaging:  No results found.  Medications: I have reviewed the patient's current medications.  Assessment/Plan: 1. 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; quantitative immunoglobulins show IgG mildly decreased 654 (range [phone]), IgA and IgM in normal range; polyclonal serum light chain elevation  11/15/2015-hemoglobin 11.2  11/29/2015-hemoglobin 11.1  12/12/2005-hemoglobin 10.4; Aranesp 100 g every 2 weeks initiated  02/07/2016-hemoglobin 11.0, Aranesp held, changed to an every 3 week schedule  Progressive anemia and  decreased iron stores December 2017, treated with IV iron January 2018 with improvement  She did not require Aranesp for 2 months and resumed Aranesp on a monthly schedule for 08/2016  Aranesp every 6 weeks beginning 05/04/2017  Progressive anemia and decreased iron stores 07/05/2017; IV irongiven2/11/2017 and 08/06/2017  Persistent severe anemia, Aranesp change to every 2 weeks beginning 10/19/2017  Persistent anemia, decreased iron stores 11/30/2017  IV iron 12/13/2017, 12/20/2017,  Decreased iron stores 05/17/2018  IV iron1/08/2018  07/05/2018-no serum M spike, polyclonal serum light chain elevation, IgG mildly decreased with IgA and IgM within normal range  2 units of blood 08/17/2018  IV iron 08/17/2018 and 08/24/2018  Stool heme +08/30/2018, referred to GI  2 units of blood 09/01/2018  1 unit of blood 10/25/2018  11/11/2018 colonoscopy-diverticulosis in the entire examined colon. End-to-end colocolonic anastomosis. One 5 mm polyp in the ascending colon. No cause for anemia.  11/11/2018 upper endoscopy-evidence of gastric antral vascular ectasia which is the likely cause of the patient's worsening anemia.  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  Hemoglobin stabilized following treatment of theGave  Every 6-week erythropoietin 06/22/2019  2. Chronic renal insufficiency  3. Atrial fibrillation-maintained on apixaban  4. Osteoarthritis  5. Diabetes  6. Sjogren's disease  7. Pericarditis February 2017  8.Colonoscopy 01/07/2012-prior segmental colectomy in the sigmoid colon; moderate diverticulosis throughout the colon; otherwise normal examination   Disposition: Ms. Reliford appears stable.  We reviewed the CBC from today.  Hemoglobin is stable.  We will follow-up on the pending iron studies.  Plan to continue every 6-week erythropoietin, injection today.   She will continue oral iron.  She will return for  lab, follow-up, erythropoietin injection in 6 weeks.  Plan reviewed with Dr. Benay Spice.    Ned Card ANP/GNP-BC   08/03/2019  12:29 PM

## 2019-08-03 NOTE — Patient Instructions (Signed)

## 2019-08-04 LAB — IRON AND TIBC
Iron: 67 ug/dL (ref 41–142)
Saturation Ratios: 19 % — ABNORMAL LOW (ref 21–57)
TIBC: 360 ug/dL (ref 236–444)
UIBC: 293 ug/dL (ref 120–384)

## 2019-08-04 LAB — FERRITIN: Ferritin: 55 ng/mL (ref 11–307)

## 2019-08-08 ENCOUNTER — Telehealth: Payer: Self-pay | Admitting: Oncology

## 2019-08-08 NOTE — Telephone Encounter (Signed)
Scheduled per los. Called and left msg. Mailed printout

## 2019-08-21 ENCOUNTER — Other Ambulatory Visit: Payer: Self-pay | Admitting: Internal Medicine

## 2019-08-22 ENCOUNTER — Other Ambulatory Visit: Payer: Self-pay | Admitting: Internal Medicine

## 2019-08-22 NOTE — Telephone Encounter (Signed)
Eliquis 14m refill request received, pt is 79yrs old, weight-82.4kg, Crea-2.11 on 02/21/2019, Diagnosis-Afib, and last seen by Dr. TLovena Leon 07/19/2018 and has an appt pending for 09/06/2019. Dose is appropriate based on dosing criteria. Will send in a refill to requested pharmacy.  Metoprolol pending for refill, but per note refuse per the original author.

## 2019-08-26 ENCOUNTER — Other Ambulatory Visit: Payer: Self-pay | Admitting: Nurse Practitioner

## 2019-09-06 ENCOUNTER — Ambulatory Visit (INDEPENDENT_AMBULATORY_CARE_PROVIDER_SITE_OTHER): Payer: Medicare Other | Admitting: Internal Medicine

## 2019-09-06 ENCOUNTER — Other Ambulatory Visit: Payer: Self-pay

## 2019-09-06 VITALS — BP 144/72 | HR 62 | Ht <= 58 in | Wt 176.0 lb

## 2019-09-06 DIAGNOSIS — I495 Sick sinus syndrome: Secondary | ICD-10-CM | POA: Diagnosis not present

## 2019-09-06 DIAGNOSIS — I10 Essential (primary) hypertension: Secondary | ICD-10-CM

## 2019-09-06 DIAGNOSIS — Z950 Presence of cardiac pacemaker: Secondary | ICD-10-CM | POA: Diagnosis not present

## 2019-09-06 DIAGNOSIS — I480 Paroxysmal atrial fibrillation: Secondary | ICD-10-CM | POA: Diagnosis not present

## 2019-09-06 NOTE — Patient Instructions (Signed)
Medication Instructions:  Your physician recommends that you continue on your current medications as directed. Please refer to the Current Medication list given to you today.  Labwork: None ordered.  Testing/Procedures: None ordered.  Follow-Up: Your physician wants you to follow-up in: one year with Dr. Lovena Le.   You will receive a reminder letter in the mail two months in advance. If you don't receive a letter, please call our office to schedule the follow-up appointment.  Remote monitoring is used to monitor your Pacemaker from home. This monitoring reduces the number of office visits required to check your device to one time per year. It allows [place] to keep an eye on the functioning of your device to ensure it is working properly. You are scheduled for a device check from home on 10/06/2019. You may send your transmission at any time that day. If you have a wireless device, the transmission will be sent automatically. After your physician reviews your transmission, you will receive a postcard with your next transmission date.  Any Other Special Instructions Will Be Listed Below (If Applicable).  If you need a refill on your cardiac medications before your next appointment, please call your pharmacy.

## 2019-09-06 NOTE — Progress Notes (Signed)
HPI Tricia Gonzales returns today for evaluation of sinus node dysfunction s/p PPM insertion. She has morbid obesity and has had trouble with her ambulation and was wheel chair bound until a few months ago when she finally started working again. She also has HTN and chronic renal insufficiency. No syncope. She has a remote h/o atrial fib. She has continued to slowly lose weight. She is down almost a 100 lbs.  Allergies    Allergies  Allergen Reactions  . Norvasc [Amlodipine Besylate] Shortness Of Breath  . Avapro [Irbesartan]     Headaches  . Glucophage [Metformin Hydrochloride] Other (See Comments)    "increases creatinine"  . Lisinopril     Headache   . Losartan     Headache   . Morphine And Related Other (See Comments)    "hallucinations"  . Simvastatin Other (See Comments)    Body aches  . Trulicity [Dulaglutide] Other (See Comments)    Severe mood swings  . Fiber Rash    Pt voiced she is allergic to micro fiber materials/ blankets     Current Outpatient Medications  Medication Sig Dispense Refill  . ACCU-CHEK AVIVA PLUS test strip 1 each by Other route daily.     Marland Kitchen acetaminophen (TYLENOL) 650 MG CR tablet Take 1,300 mg by mouth at bedtime as needed for pain.    . dorzolamide (TRUSOPT) 2 % ophthalmic solution Place 1 drop into the right eye 2 (two) times daily. 10 mL 0  . ELIQUIS 5 MG TABS tablet TAKE ONE TABLET TWICE DAILY 180 tablet 1  . Epoetin Alfa (PROCRIT IJ) Inject 1 Dose as directed every 6 (six) weeks.     . febuxostat (ULORIC) 40 MG tablet Take 1 tablet (40 mg total) by mouth every Monday, Wednesday, and Friday. 12 tablet 0  . ferrous sulfate 325 (65 FE) MG EC tablet Take 1 tablet (325 mg total) by mouth 2 (two) times daily. 60 tablet 3  . furosemide (LASIX) 40 MG tablet Take 1 tablet (40 mg total) by mouth daily. 30 tablet 0  . gabapentin (NEURONTIN) 300 MG capsule Take 1 capsule (300 mg total) by mouth daily. 30 capsule 0  . gabapentin (NEURONTIN) 400 MG  capsule Take 1 capsule (400 mg total) by mouth at bedtime. 30 capsule 0  . JANUVIA 50 MG tablet Take 1 tablet (50 mg total) by mouth daily. 30 tablet 0  . levothyroxine (SYNTHROID) 50 MCG tablet Take 1 tablet (50 mcg total) by mouth at bedtime. 30 tablet 0  . metoprolol tartrate (LOPRESSOR) 25 MG tablet Take 1 tablet (25 mg total) by mouth daily. 30 tablet 0  . montelukast (SINGULAIR) 10 MG tablet Take 1 tablet (10 mg total) by mouth at bedtime. 30 tablet 0  . Multiple Vitamin (MULITIVITAMIN WITH MINERALS) TABS Take 1 tablet by mouth daily.    Marland Kitchen nystatin (NYAMYC) powder APPLY TOPICALLY TO LEFT ABDOMINAL FOLD AND GROIN TWICE A DAY 15 g 0  . pantoprazole (PROTONIX) 40 MG tablet Take 1 tablet (40 mg total) by mouth daily. 30 tablet 0  . pentoxifylline (TRENTAL) 400 MG CR tablet Take 1 tablet (400 mg total) by mouth daily. 30 tablet 0  . Polyethylene Glycol 3350 (MIRALAX PO) Take 17 g by mouth daily.    . rosuvastatin (CRESTOR) 5 MG tablet Take 1 tablet (5 mg total) by mouth daily. 30 tablet 0  . senna-docusate (SENOKOT-S) 8.6-50 MG tablet Take 1 tablet by mouth at bedtime.    Marland Kitchen  TOUJEO SOLOSTAR 300 UNIT/ML SOPN Inject 16 Units into the skin every evening. (Patient taking differently: Inject 14 Units into the skin every evening. ) 1.5 mL 0  . traZODone (DESYREL) 50 MG tablet Take 0.5 tablets (25 mg total) by mouth at bedtime. 0.5 tablet to = 25 mg 15 tablet 0   No current facility-administered medications for this visit.     Past Medical History:  Diagnosis Date  . Anemia   . 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 Anxiety   . Arthritis   . Atrial fibrillation ([place])   . Avascular necrosis (H[facility]) hip left   leg pain also  . Chronic back pain   . Chronic kidney disease (CKD), stage III (moderate)   . Depression   . Diverticulosis   . Early cataracts, bilateral   . Esophageal stricture   . Gastritis   . GERD (gastroesophageal reflux disease)   . Glaucoma   . Heart murmur     " some  doctors say that I have one some say that I don"t "  . Hiatal hernia   . History of blood transfusion    "@ least w/1st knee OR"  . History of gout   . History of kidney stones   . Hyperlipidemia   . Hypertension   . Intestinal obstruction ([place])   . Kidney stones   . Obesity   . Osteoarthritis   . Osteoporosis   . Pericarditis 2016  . Presence of permanent cardiac pacemaker   . Sjogren's disease ([place])   . Thyroid disease   . Type II diabetes mellitus ([place])   . Vitamin B12 deficiency     ROS:   All systems reviewed and negative except as noted in the HPI.   Past Surgical History:  Procedure Laterality Date  . ABDOMINAL HYSTERECTOMY     complete  . CHOLECYSTECTOMY OPEN    . COLECTOMY     for rectovaginal fistula  . COLONOSCOPY  01/07/2012   Procedure: COLONOSCOPY;  Surgeon: Lafayette Dragon, MD;  Location: [facility];  Service: Endoscopy;  Laterality: N/A;  . EP IMPLANTABLE DEVICE N/A 06/18/2015   Procedure: Pacemaker Implant;  Surgeon: Evans Lance, MD;  Location: [facility];  Service: Cardiovascular;  Laterality: N/A;  . ESOPHAGOGASTRODUODENOSCOPY N/A 11/24/2018   Procedure: ESOPHAGOGASTRODUODENOSCOPY (EGD);  Surgeon: Yetta Flock, MD;  Location: [facility];  Service: Gastroenterology;  Laterality: N/A;  . ESOPHAGOGASTRODUODENOSCOPY (EGD) WITH ESOPHAGEAL DILATION    . ESOPHAGOGASTRODUODENOSCOPY (EGD) WITH PROPOFOL N/A 08/12/2015   Procedure: ESOPHAGOGASTRODUODENOSCOPY (EGD) WITH PROPOFOL;  Surgeon: Manus Gunning, MD;  Location: [facility];  Service: Gastroenterology;  Laterality: N/A;  . EYE SURGERY Bilateral    cataracts  . HOT HEMOSTASIS N/A 11/24/2018   Procedure: HOT HEMOSTASIS (ARGON PLASMA COAGULATION/BICAP);  Surgeon: Yetta Flock, MD;  Location: [facility];  Service: Gastroenterology;  Laterality: N/A;  . I & D EXTREMITY Right 08/06/2016   Procedure: DEBRIDEMENT PIP RIGHT RING FINGER;  Surgeon: Daryll Brod, MD;  Location: [facility];   Service: Orthopedics;  Laterality: Right;  . INSERT / REPLACE / REMOVE PACEMAKER    . JOINT REPLACEMENT    . KNEE ARTHROSCOPY Right   . MASS EXCISION Right 08/06/2016   Procedure: EXCISION CYST;  Surgeon: Daryll Brod, MD;  Location: [facility];  Service: Orthopedics;  Laterality: Right;  . REVISION TOTAL KNEE ARTHROPLASTY Left   . TOTAL KNEE ARTHROPLASTY Bilateral      Family History  Problem Relation Age  of Onset  . Diabetes Mother   . Diabetes Brother   . Hypertension Sister   . Colon cancer Neg Hx   . Heart attack Neg Hx   . Stroke Neg Hx   . Colon polyps Neg Hx   . Esophageal cancer Neg Hx   . Rectal cancer Neg Hx   . Stomach cancer Neg Hx      Social History   Socioeconomic History  . Marital status: Divorced    Spouse name: Not on file  . Number of children: 2  . Years of education: Not on file  . Highest education level: Not on file  Occupational History  . Not on file  Tobacco Use  . Smoking status: Former Smoker    Packs/day: 2.00    Years: 3.00    Pack years: 6.00    Types: Cigarettes    Quit date: 06/23/1983    Years since quitting: 36.2  . Smokeless tobacco: Never Used  Substance and Sexual Activity  . Alcohol use: Yes    Alcohol/week: 2.0 standard drinks    Types: 2 Glasses of wine per week    Comment: occ  . Drug use: No  . Sexual activity: Never  Other Topics Concern  . Not on file  Social History Narrative  . Not on file   Social Determinants of Health   Financial Resource Strain:   . Difficulty of Paying Living Expenses:   Food Insecurity:   . Worried About Charity fundraiser in the Last Year:   . Arboriculturist in the Last Year:   Transportation Needs:   . Film/video editor (Medical):   Marland Kitchen Lack of Transportation (Non-Medical):   Physical Activity:   . Days of Exercise per Week:   . Minutes of Exercise per Session:   Stress:   . Feeling of Stress :   Social Connections:   . Frequency of Communication with Friends and Family:   .  Frequency of Social Gatherings with Friends and Family:   . Attends Religious Services:   . Active Member of Clubs or Organizations:   . Attends Archivist Meetings:   Marland Kitchen Marital Status:   Intimate Partner Violence:   . Fear of Current or Ex-Partner:   . Emotionally Abused:   Marland Kitchen Physically Abused:   . Sexually Abused:      BP (!) 144/72   Pulse 62   Ht _0  (1.448 m)   Wt 176 lb (79.8 kg)   BMI 38.09 kg/m   Physical Exam:  Well appearing NAD HEENT: Unremarkable Neck:  No JVD, no thyromegally Lymphatics:  No adenopathy Back:  No CVA tenderness Lungs:  Clear with no wheezes HEART:  Regular rate rhythm, no murmurs, no rubs, no clicks Abd:  soft, positive bowel sounds, no organomegally, no rebound, no guarding Ext:  2 plus pulses, no edema, no cyanosis, no clubbing Skin:  No rashes no nodules Neuro:  CN II through XII intact, motor grossly intact  EKG - nsr with ventricular pacing  DEVICE  Normal device function.  See PaceArt for details.   Assess/Plan: 1. Sinus node dysfunction /progressive AV block - her symptoms are stable and she is pacing in the A/V most of the time. 2. Morbid obesity - she has lost over 90 lbs. She is encouraged to increase her activity. 3. HTN - Her pressure is well controlled. She will maintain a low sodium diet.   Mikle Bosworth.D.

## 2019-09-14 ENCOUNTER — Inpatient Hospital Stay: Payer: Medicare Other

## 2019-09-14 ENCOUNTER — Other Ambulatory Visit: Payer: Self-pay

## 2019-09-14 ENCOUNTER — Inpatient Hospital Stay: Payer: Medicare Other | Attending: Oncology | Admitting: Oncology

## 2019-09-14 VITALS — BP 154/58 | HR 80 | Temp 98.0°F | Resp 17 | Ht <= 58 in | Wt 175.4 lb

## 2019-09-14 DIAGNOSIS — N189 Chronic kidney disease, unspecified: Secondary | ICD-10-CM

## 2019-09-14 DIAGNOSIS — N183 Chronic kidney disease, stage 3 unspecified: Secondary | ICD-10-CM | POA: Insufficient documentation

## 2019-09-14 DIAGNOSIS — D631 Anemia in chronic kidney disease: Secondary | ICD-10-CM

## 2019-09-14 LAB — CBC WITH DIFFERENTIAL (CANCER CENTER ONLY)
Abs Immature Granulocytes: 0.03 10*3/uL (ref 0.00–0.07)
Basophils Absolute: 0 10*3/uL (ref 0.0–0.1)
Basophils Relative: 0 %
Eosinophils Absolute: 0 10*3/uL (ref 0.0–0.5)
Eosinophils Relative: 0 %
HCT: 28.8 % — ABNORMAL LOW (ref 36.0–46.0)
Hemoglobin: 9 g/dL — ABNORMAL LOW (ref 12.0–15.0)
Immature Granulocytes: 0 %
Lymphocytes Relative: 9 %
Lymphs Abs: 0.8 10*3/uL (ref 0.7–4.0)
MCH: 31 pg (ref 26.0–34.0)
MCHC: 31.3 g/dL (ref 30.0–36.0)
MCV: 99.3 fL (ref 80.0–100.0)
Monocytes Absolute: 0.6 10*3/uL (ref 0.1–1.0)
Monocytes Relative: 7 %
Neutro Abs: 7.1 10*3/uL (ref 1.7–7.7)
Neutrophils Relative %: 84 %
Platelet Count: 255 10*3/uL (ref 150–400)
RBC: 2.9 MIL/uL — ABNORMAL LOW (ref 3.87–5.11)
RDW: 14.5 % (ref 11.5–15.5)
WBC Count: 8.5 10*3/uL (ref 4.0–10.5)
nRBC: 0 % (ref 0.0–0.2)

## 2019-09-14 MED ORDER — EPOETIN ALFA-EPBX 40000 UNIT/ML IJ SOLN
60000.0000 [IU] | Freq: Once | INTRAMUSCULAR | Status: AC
  Administered 2019-09-14: 60000 [IU] via SUBCUTANEOUS

## 2019-09-14 MED ORDER — EPOETIN ALFA-EPBX 40000 UNIT/ML IJ SOLN
INTRAMUSCULAR | Status: AC
  Filled 2019-09-14: qty 1

## 2019-09-14 MED ORDER — EPOETIN ALFA-EPBX 10000 UNIT/ML IJ SOLN
INTRAMUSCULAR | Status: AC
  Filled 2019-09-14: qty 2

## 2019-09-14 NOTE — Patient Instructions (Signed)

## 2019-09-14 NOTE — Progress Notes (Signed)
[place] OFFICE PROGRESS NOTE   Diagnosis: Anemia, GI bleeding, chronic renal failure  INTERVAL HISTORY:   Ms. Mahrt returns as scheduled.  She continues erythropoietin therapy.  She is taking iron.  She last received erythropoietin on 08/03/2019.  She is denies bleeding.  She is ambulatory.  No new complaint. Objective:  Vital signs in last 24 hours:  Blood pressure (!) 154/58, pulse 80, temperature 98 F (36.7 C), temperature source Temporal, resp. rate 17, height _0  (1.448 m), weight 175 lb 6.4 oz (79.6 kg), SpO2 97 %.   Physical examination-not performed today    Lab Results:  Lab Results  Component Value Date   WBC 8.5 09/14/2019   HGB 9.0 (L) 09/14/2019   HCT 28.8 (L) 09/14/2019   MCV 99.3 09/14/2019   PLT 255 09/14/2019   NEUTROABS 7.1 09/14/2019    CMP  Lab Results  Component Value Date   NA 138 02/21/2019   K 4.4 02/21/2019   CL 98 02/21/2019   CO2 30 02/21/2019   GLUCOSE 144 (H) 02/21/2019   BUN 61 (H) 02/21/2019   CREATININE 2.11 (H) 02/21/2019   CALCIUM 10.0 02/21/2019   PROT 6.1 (L) 07/05/2018   ALBUMIN 3.3 (L) 07/05/2018   AST 12 (L) 07/05/2018   ALT 8 07/05/2018   ALKPHOS 59 07/05/2018   BILITOT 0.3 07/05/2018   GFRNONAA 22 (L) 02/21/2019   GFRAA 26 (L) 02/21/2019   08/03/2019: Ferritin 55, iron 67, TIBC 360 Medications: I have reviewed the patient's current medications.   Assessment/Plan: 1. 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; quantitative immunoglobulins show IgG mildly decreased 654  (range [phone]), IgA and IgM in normal range; polyclonal serum light chain elevation  11/15/2015-hemoglobin 11.2  11/29/2015-hemoglobin 11.1  12/12/2005-hemoglobin 10.4; Aranesp 100 g every 2 weeks initiated  02/07/2016-hemoglobin 11.0, Aranesp held, changed to an every 3 week schedule  Progressive anemia and decreased iron stores December 2017, treated with IV iron January 2018 with improvement  She did not require Aranesp for 2 months and resumed Aranesp on a monthly schedule for 08/2016  Aranesp every 6 weeks beginning 05/04/2017  Progressive anemia and decreased iron stores 07/05/2017; IV irongiven2/11/2017 and 08/06/2017  Persistent severe anemia, Aranesp change to every 2 weeks beginning 10/19/2017  Persistent anemia, decreased iron stores 11/30/2017  IV iron 12/13/2017, 12/20/2017,  Decreased iron stores 05/17/2018  IV iron1/08/2018  07/05/2018-no serum M spike, polyclonal serum light chain elevation, IgG mildly decreased with IgA and IgM within normal range  2 units of blood 08/17/2018  IV iron 08/17/2018 and 08/24/2018  Stool heme +08/30/2018, referred to GI  2 units of blood 09/01/2018  1 unit of blood 10/25/2018  11/11/2018 colonoscopy-diverticulosis in the entire examined colon. End-to-end colocolonic anastomosis. One 5 mm polyp in the ascending colon. No cause for anemia.  11/11/2018 upper endoscopy-evidence of gastric antral vascular ectasia which is the likely cause of the patient's worsening anemia.  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  Hemoglobin stabilized following treatment of theGave  Every 6-week erythropoietin 06/22/2019, erythropoietin dose increased to 60,000 units 09/14/2019  2. Chronic renal insufficiency  3. Atrial  fibrillation-maintained on apixaban  4. Osteoarthritis  5. Diabetes  6. Sjogren's disease  7. Pericarditis  February 2017  8.Colonoscopy 01/07/2012-prior segmental colectomy in the sigmoid colon; moderate diverticulosis throughout the colon; otherwise normal examination   Disposition: Ms. Bassinger appears unchanged.  The hemoglobin is stable.  I recommended she increase the iron to 3 times daily.  We increased the erythropoietin dose to 60,000 units every 6 weeks.  She does not wish to return for injection therapy more frequently.  She would like to hold on IV iron therapy for now.  We will repeat iron studies when she returns in 6 weeks.  I will recommend IV iron if the hemoglobin is lower.  [name], MD  09/14/2019  12:55 PM

## 2019-09-15 ENCOUNTER — Telehealth: Payer: Self-pay | Admitting: Oncology

## 2019-09-15 NOTE — Telephone Encounter (Signed)
Scheduled per los. Called and spoke with patient. Confirmed appt 

## 2019-09-16 ENCOUNTER — Other Ambulatory Visit: Payer: Self-pay | Admitting: Internal Medicine

## 2019-09-19 ENCOUNTER — Telehealth: Payer: Self-pay | Admitting: Internal Medicine

## 2019-09-19 MED ORDER — METOPROLOL TARTRATE 25 MG PO TABS: 25.0000 mg | ORAL_TABLET | Freq: Every day | ORAL | 3 refills | Status: DC

## 2019-09-19 NOTE — Telephone Encounter (Signed)
At last OV it was listed that pt takes Metoprolol Tartrate 43m BID.  Spoke to pt to clarify which Metoprolol she has been taking.  Pt states she takes Metoprolol Succinate 262mQD normally.  Advised I will send to Dr. TaLovena Leor review and we would call if any changes needed to be made, otherwise, if he agreed, we will send in Metoprolol Succinate.  Pt appreciative for call.

## 2019-09-19 NOTE — Telephone Encounter (Signed)
Pt c/o medication issue:  1. Name of Medication: metoprolol tartrate (LOPRESSOR) 25 MG tablet metoprolol succinate (TOPROL-XL) 25 MG   2. How are you currently taking this medication (dosage and times per day)?   3. Are you having a reaction (difficulty breathing--STAT)?   4. What is your medication issue? [facility] is calling stating they received a prescription for  metoprolol tartrate instead of metoprolol succinate that Keysha normally takes. They would like a callback verifying this change before filling the medication. Please advise.

## 2019-09-19 NOTE — Telephone Encounter (Signed)
No change if she is doing ok. Continue succinate and change on med list. GT

## 2019-09-20 MED ORDER — METOPROLOL SUCCINATE ER 25 MG PO TB24: 25.0000 mg | ORAL_TABLET | Freq: Every day | ORAL | 3 refills | Status: DC

## 2019-09-20 NOTE — Telephone Encounter (Signed)
Updated med list and sent to pharmacy as requested.

## 2019-10-06 ENCOUNTER — Ambulatory Visit (INDEPENDENT_AMBULATORY_CARE_PROVIDER_SITE_OTHER): Payer: Medicare Other | Admitting: *Deleted

## 2019-10-06 DIAGNOSIS — R001 Bradycardia, unspecified: Secondary | ICD-10-CM

## 2019-10-06 LAB — CUP PACEART REMOTE DEVICE CHECK
Battery Remaining Longevity: 56 mo
Battery Voltage: 2.99 V
Brady Statistic AP VP Percent: 59.03 %
Brady Statistic AP VS Percent: 0.07 %
Brady Statistic AS VP Percent: 38.44 %
Brady Statistic AS VS Percent: 2.47 %
Brady Statistic RA Percent Paced: 58.62 %
Brady Statistic RV Percent Paced: 96.41 %
Date Time Interrogation Session: 20210416124840
Implantable Lead Implant Date: 20161227
Implantable Lead Implant Date: 20161227
Implantable Lead Location: 753859
Implantable Lead Location: 753860
Implantable Lead Model: 5076
Implantable Lead Model: 5076
Implantable Pulse Generator Implant Date: 20161227
Lead Channel Impedance Value: 304 Ohm
Lead Channel Impedance Value: 342 Ohm
Lead Channel Impedance Value: 342 Ohm
Lead Channel Impedance Value: 399 Ohm
Lead Channel Pacing Threshold Amplitude: 0.75 V
Lead Channel Pacing Threshold Amplitude: 1.125 V
Lead Channel Pacing Threshold Pulse Width: 0.4 ms
Lead Channel Pacing Threshold Pulse Width: 0.4 ms
Lead Channel Sensing Intrinsic Amplitude: 1.875 mV
Lead Channel Sensing Intrinsic Amplitude: 1.875 mV
Lead Channel Sensing Intrinsic Amplitude: 17.125 mV
Lead Channel Sensing Intrinsic Amplitude: 17.125 mV
Lead Channel Setting Pacing Amplitude: 2 V
Lead Channel Setting Pacing Amplitude: 2.5 V
Lead Channel Setting Pacing Pulse Width: 0.4 ms
Lead Channel Setting Sensing Sensitivity: 2.8 mV

## 2019-10-06 NOTE — Progress Notes (Signed)
PPM Remote

## 2019-10-24 ENCOUNTER — Encounter: Payer: Self-pay | Admitting: Nurse Practitioner

## 2019-10-24 ENCOUNTER — Other Ambulatory Visit: Payer: Self-pay

## 2019-10-24 ENCOUNTER — Inpatient Hospital Stay: Payer: Medicare Other

## 2019-10-24 ENCOUNTER — Inpatient Hospital Stay: Payer: Medicare Other | Attending: Oncology | Admitting: Nurse Practitioner

## 2019-10-24 VITALS — BP 142/48 | HR 79 | Temp 98.0°F | Resp 17 | Ht <= 58 in | Wt 175.5 lb

## 2019-10-24 DIAGNOSIS — D631 Anemia in chronic kidney disease: Secondary | ICD-10-CM

## 2019-10-24 DIAGNOSIS — N189 Chronic kidney disease, unspecified: Secondary | ICD-10-CM | POA: Insufficient documentation

## 2019-10-24 DIAGNOSIS — N183 Chronic kidney disease, stage 3 unspecified: Secondary | ICD-10-CM

## 2019-10-24 LAB — CBC WITH DIFFERENTIAL (CANCER CENTER ONLY)
Abs Immature Granulocytes: 0.02 10*3/uL (ref 0.00–0.07)
Basophils Absolute: 0 10*3/uL (ref 0.0–0.1)
Basophils Relative: 0 %
Eosinophils Absolute: 0 10*3/uL (ref 0.0–0.5)
Eosinophils Relative: 0 %
HCT: 28.1 % — ABNORMAL LOW (ref 36.0–46.0)
Hemoglobin: 8.7 g/dL — ABNORMAL LOW (ref 12.0–15.0)
Immature Granulocytes: 0 %
Lymphocytes Relative: 7 %
Lymphs Abs: 0.6 10*3/uL — ABNORMAL LOW (ref 0.7–4.0)
MCH: 30.7 pg (ref 26.0–34.0)
MCHC: 31 g/dL (ref 30.0–36.0)
MCV: 99.3 fL (ref 80.0–100.0)
Monocytes Absolute: 0.6 10*3/uL (ref 0.1–1.0)
Monocytes Relative: 8 %
Neutro Abs: 6.5 10*3/uL (ref 1.7–7.7)
Neutrophils Relative %: 85 %
Platelet Count: 254 10*3/uL (ref 150–400)
RBC: 2.83 MIL/uL — ABNORMAL LOW (ref 3.87–5.11)
RDW: 15.2 % (ref 11.5–15.5)
WBC Count: 7.6 10*3/uL (ref 4.0–10.5)
nRBC: 0 % (ref 0.0–0.2)

## 2019-10-24 LAB — SAMPLE TO BLOOD BANK

## 2019-10-24 LAB — IRON AND TIBC
Iron: 85 ug/dL (ref 41–142)
Saturation Ratios: 22 % (ref 21–57)
TIBC: 384 ug/dL (ref 236–444)
UIBC: 299 ug/dL (ref 120–384)

## 2019-10-24 LAB — FERRITIN: Ferritin: 39 ng/mL (ref 11–307)

## 2019-10-24 MED ORDER — EPOETIN ALFA-EPBX 40000 UNIT/ML IJ SOLN
60000.0000 [IU] | Freq: Once | INTRAMUSCULAR | Status: AC
  Administered 2019-10-24: 13:00:00 60000 [IU] via SUBCUTANEOUS

## 2019-10-24 NOTE — Progress Notes (Signed)
[facility] OFFICE PROGRESS NOTE   Diagnosis: Anemia, GI bleeding, chronic renal failure  INTERVAL HISTORY:   Tricia Gonzales returns as scheduled.  She continues erythropoietin.  The erythropoietin dose was increased to 60,000 units 09/14/2019.  She feels well.  No shortness of breath.  She denies bleeding.  No abdominal pain.  No nausea or vomiting.  Objective:  Vital signs in last 24 hours:  Blood pressure (!) 142/48, pulse 79, temperature 98 F (36.7 C), resp. rate 17, height _0  (1.448 m), weight 175 lb 8 oz (79.6 kg), SpO2 97 %.    Resp: Lungs clear bilaterally. Cardio: Regular rate and rhythm. GI: Abdomen soft and nontender.  No hepatosplenomegaly. Vascular: Trace edema at the lower legs bilaterally.    Lab Results:  Lab Results  Component Value Date   WBC 7.6 10/24/2019   HGB 8.7 (L) 10/24/2019   HCT 28.1 (L) 10/24/2019   MCV 99.3 10/24/2019   PLT 254 10/24/2019   NEUTROABS 6.5 10/24/2019    Imaging:  No results found.  Medications: I have reviewed the patient's current medications.  Assessment/Plan: 1. 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; quantitative immunoglobulins show IgG mildly decreased 654 (range [phone]), IgA and IgM in normal range; polyclonal serum light chain elevation  11/15/2015-hemoglobin 11.2  11/29/2015-hemoglobin 11.1  12/12/2005-hemoglobin 10.4; Aranesp 100 g every 2 weeks initiated  02/07/2016-hemoglobin 11.0, Aranesp held, changed to an every 3 week schedule  Progressive anemia and  decreased iron stores December 2017, treated with IV iron January 2018 with improvement  She did not require Aranesp for 2 months and resumed Aranesp on a monthly schedule for 08/2016  Aranesp every 6 weeks beginning 05/04/2017  Progressive anemia and decreased iron stores 07/05/2017; IV irongiven2/11/2017 and 08/06/2017  Persistent severe anemia, Aranesp change to every 2 weeks beginning 10/19/2017  Persistent anemia, decreased iron stores 11/30/2017  IV iron 12/13/2017, 12/20/2017,  Decreased iron stores 05/17/2018  IV iron1/08/2018  07/05/2018-no serum M spike, polyclonal serum light chain elevation, IgG mildly decreased with IgA and IgM within normal range  2 units of blood 08/17/2018  IV iron 08/17/2018 and 08/24/2018  Stool heme +08/30/2018, referred to GI  2 units of blood 09/01/2018  1 unit of blood 10/25/2018  11/11/2018 colonoscopy-diverticulosis in the entire examined colon. End-to-end colocolonic anastomosis. One 5 mm polyp in the ascending colon. No cause for anemia.  11/11/2018 upper endoscopy-evidence of gastric antral vascular ectasia which is the likely cause of the patient's worsening anemia.  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  Hemoglobin stabilized following treatment of theGave  Every 6-week erythropoietin 06/22/2019, erythropoietin dose increased to 60,000 units 09/14/2019  Erythropoietin 60,000 units every 3 weeks 10/24/2019  2. Chronic renal insufficiency  3. Atrial fibrillation-maintained on apixaban  4. Osteoarthritis  5. Diabetes  6. Sjogren's disease  7. Pericarditis February 2017  8.Colonoscopy 01/07/2012-prior segmental colectomy in the sigmoid colon; moderate diverticulosis throughout the colon; otherwise normal examination   Disposition: Tricia Gonzales appears stable.  She is currently receiving erythropoietin 60,000 units  every 6 weeks.  We reviewed the CBC from today.  Hemoglobin is slightly lower  than 6 weeks ago.  She appears asymptomatic.  Sign/symptoms suggestive of progressive anemia reviewed with her at today's visit.  She will contact the office should she develop any of these.  She will receive erythropoietin today.  Dr. Benay Spice recommends changing the dose interval from 6 weeks to every 3 weeks.  She agrees with this plan.  We will follow-up on the iron studies from today, IV iron as indicated.  She will return for erythropoietin in 3 weeks.  She will return for lab, follow-up, erythropoietin in 6 weeks.  She will contact the office in the interim as outlined above or with any other problems.  Plan reviewed with Dr. Benay Spice.    Ned Card ANP/GNP-BC   10/24/2019  12:28 PM

## 2019-10-25 ENCOUNTER — Telehealth: Payer: Self-pay | Admitting: Oncology

## 2019-10-25 ENCOUNTER — Telehealth: Payer: Self-pay | Admitting: Nurse Practitioner

## 2019-10-25 NOTE — Telephone Encounter (Signed)
Scheduled per los. Called and left msg. Mailed printout

## 2019-10-25 NOTE — Telephone Encounter (Signed)
Scheduled appt per 5/4 sch message - unable to reach pt - left message with appt date and time

## 2019-10-26 ENCOUNTER — Telehealth: Payer: Self-pay | Admitting: *Deleted

## 2019-10-26 NOTE — Telephone Encounter (Signed)
Per Ned Card, NP, call pt to make aware of IV iron infusion needing to be scheduled. Left message on py personal phone. Advised to call with questions and/or concerns

## 2019-10-27 ENCOUNTER — Other Ambulatory Visit: Payer: Self-pay | Admitting: *Deleted

## 2019-10-31 ENCOUNTER — Telehealth: Payer: Self-pay | Admitting: Oncology

## 2019-10-31 ENCOUNTER — Ambulatory Visit: Payer: Medicare Other

## 2019-10-31 NOTE — Telephone Encounter (Signed)
R/s 5/18 appt to 5/19 so infusions were a week apart - left message for pt with new appt date and time

## 2019-11-01 ENCOUNTER — Inpatient Hospital Stay: Payer: Medicare Other

## 2019-11-01 ENCOUNTER — Other Ambulatory Visit: Payer: Self-pay

## 2019-11-01 VITALS — BP 151/60 | HR 60 | Temp 98.0°F | Resp 18

## 2019-11-01 DIAGNOSIS — N183 Chronic kidney disease, stage 3 unspecified: Secondary | ICD-10-CM

## 2019-11-01 DIAGNOSIS — N189 Chronic kidney disease, unspecified: Secondary | ICD-10-CM | POA: Diagnosis not present

## 2019-11-01 DIAGNOSIS — D631 Anemia in chronic kidney disease: Secondary | ICD-10-CM

## 2019-11-01 MED ORDER — SODIUM CHLORIDE 0.9 % IV SOLN
Freq: Once | INTRAVENOUS | Status: AC
  Filled 2019-11-01: qty 250

## 2019-11-01 MED ORDER — SODIUM CHLORIDE 0.9 % IV SOLN
510.0000 mg | Freq: Once | INTRAVENOUS | Status: AC
  Administered 2019-11-01: 510 mg via INTRAVENOUS
  Filled 2019-11-01: qty 510

## 2019-11-01 NOTE — Patient Instructions (Signed)

## 2019-11-07 ENCOUNTER — Ambulatory Visit: Payer: Medicare Other

## 2019-11-08 ENCOUNTER — Inpatient Hospital Stay: Payer: Medicare Other

## 2019-11-08 ENCOUNTER — Other Ambulatory Visit: Payer: Self-pay

## 2019-11-08 VITALS — BP 156/47 | HR 70 | Temp 98.7°F | Resp 18

## 2019-11-08 DIAGNOSIS — N189 Chronic kidney disease, unspecified: Secondary | ICD-10-CM | POA: Diagnosis not present

## 2019-11-08 DIAGNOSIS — D631 Anemia in chronic kidney disease: Secondary | ICD-10-CM

## 2019-11-08 DIAGNOSIS — N183 Chronic kidney disease, stage 3 unspecified: Secondary | ICD-10-CM

## 2019-11-08 MED ORDER — SODIUM CHLORIDE 0.9 % IV SOLN
Freq: Once | INTRAVENOUS | Status: AC
  Filled 2019-11-08: qty 250

## 2019-11-08 MED ORDER — SODIUM CHLORIDE 0.9 % IV SOLN
510.0000 mg | Freq: Once | INTRAVENOUS | Status: AC
  Administered 2019-11-08: 510 mg via INTRAVENOUS
  Filled 2019-11-08: qty 510

## 2019-11-08 MED ORDER — SODIUM CHLORIDE 0.9 % IV SOLN
Freq: Once | INTRAVENOUS | Status: DC
  Filled 2019-11-08: qty 250

## 2019-11-08 NOTE — Patient Instructions (Signed)

## 2019-11-14 ENCOUNTER — Ambulatory Visit: Payer: Medicare Other

## 2019-11-14 ENCOUNTER — Other Ambulatory Visit: Payer: Medicare Other

## 2019-11-17 ENCOUNTER — Other Ambulatory Visit (HOSPITAL_COMMUNITY): Payer: Self-pay | Admitting: Orthopedic Surgery

## 2019-11-17 DIAGNOSIS — M25511 Pain in right shoulder: Secondary | ICD-10-CM

## 2019-11-23 ENCOUNTER — Inpatient Hospital Stay: Payer: Medicare Other

## 2019-11-23 ENCOUNTER — Other Ambulatory Visit: Payer: Self-pay

## 2019-11-23 ENCOUNTER — Inpatient Hospital Stay: Payer: Medicare Other | Attending: Oncology

## 2019-11-23 VITALS — BP 152/50 | Temp 98.2°F

## 2019-11-23 DIAGNOSIS — N183 Chronic kidney disease, stage 3 unspecified: Secondary | ICD-10-CM

## 2019-11-23 DIAGNOSIS — D631 Anemia in chronic kidney disease: Secondary | ICD-10-CM | POA: Insufficient documentation

## 2019-11-23 DIAGNOSIS — N189 Chronic kidney disease, unspecified: Secondary | ICD-10-CM

## 2019-11-23 LAB — CBC WITH DIFFERENTIAL (CANCER CENTER ONLY)
Abs Immature Granulocytes: 0.07 10*3/uL (ref 0.00–0.07)
Basophils Absolute: 0 10*3/uL (ref 0.0–0.1)
Basophils Relative: 0 %
Eosinophils Absolute: 0 10*3/uL (ref 0.0–0.5)
Eosinophils Relative: 0 %
HCT: 28.7 % — ABNORMAL LOW (ref 36.0–46.0)
Hemoglobin: 8.9 g/dL — ABNORMAL LOW (ref 12.0–15.0)
Immature Granulocytes: 1 %
Lymphocytes Relative: 8 %
Lymphs Abs: 0.7 10*3/uL (ref 0.7–4.0)
MCH: 30.9 pg (ref 26.0–34.0)
MCHC: 31 g/dL (ref 30.0–36.0)
MCV: 99.7 fL (ref 80.0–100.0)
Monocytes Absolute: 0.6 10*3/uL (ref 0.1–1.0)
Monocytes Relative: 8 %
Neutro Abs: 7 10*3/uL (ref 1.7–7.7)
Neutrophils Relative %: 83 %
Platelet Count: 275 10*3/uL (ref 150–400)
RBC: 2.88 MIL/uL — ABNORMAL LOW (ref 3.87–5.11)
RDW: 15.5 % (ref 11.5–15.5)
WBC Count: 8.5 10*3/uL (ref 4.0–10.5)
nRBC: 0 % (ref 0.0–0.2)

## 2019-11-23 LAB — SAMPLE TO BLOOD BANK

## 2019-11-23 MED ORDER — EPOETIN ALFA-EPBX 10000 UNIT/ML IJ SOLN
INTRAMUSCULAR | Status: AC
  Filled 2019-11-23: qty 2

## 2019-11-23 MED ORDER — EPOETIN ALFA-EPBX 40000 UNIT/ML IJ SOLN
INTRAMUSCULAR | Status: AC
  Filled 2019-11-23: qty 1

## 2019-11-23 MED ORDER — EPOETIN ALFA-EPBX 40000 UNIT/ML IJ SOLN
60000.0000 [IU] | Freq: Once | INTRAMUSCULAR | Status: AC
  Administered 2019-11-23: 60000 [IU] via SUBCUTANEOUS

## 2019-11-23 NOTE — Patient Instructions (Signed)

## 2019-11-30 ENCOUNTER — Telehealth: Payer: Self-pay | Admitting: Oncology

## 2019-11-30 NOTE — Telephone Encounter (Signed)
Called pt per 6/10 sch message - unable to reach pt - left message for patient to call back to reschedule appt.

## 2019-12-05 ENCOUNTER — Ambulatory Visit: Payer: Medicare Other | Admitting: Oncology

## 2019-12-05 ENCOUNTER — Ambulatory Visit: Payer: Medicare Other

## 2019-12-05 ENCOUNTER — Other Ambulatory Visit: Payer: Medicare Other

## 2019-12-13 ENCOUNTER — Encounter (HOSPITAL_COMMUNITY): Payer: Self-pay

## 2019-12-13 ENCOUNTER — Ambulatory Visit (HOSPITAL_COMMUNITY): Payer: Medicare Other

## 2019-12-15 ENCOUNTER — Ambulatory Visit: Payer: Medicare Other

## 2019-12-15 ENCOUNTER — Other Ambulatory Visit: Payer: Medicare Other

## 2019-12-15 ENCOUNTER — Ambulatory Visit: Payer: Medicare Other | Admitting: Oncology

## 2019-12-15 ENCOUNTER — Inpatient Hospital Stay: Payer: Medicare Other

## 2019-12-15 ENCOUNTER — Other Ambulatory Visit: Payer: Self-pay

## 2019-12-15 ENCOUNTER — Inpatient Hospital Stay (HOSPITAL_BASED_OUTPATIENT_CLINIC_OR_DEPARTMENT_OTHER): Payer: Medicare Other | Admitting: Oncology

## 2019-12-15 VITALS — BP 141/64 | HR 69 | Temp 97.7°F | Resp 20 | Ht <= 58 in | Wt 173.1 lb

## 2019-12-15 DIAGNOSIS — D631 Anemia in chronic kidney disease: Secondary | ICD-10-CM

## 2019-12-15 DIAGNOSIS — N189 Chronic kidney disease, unspecified: Secondary | ICD-10-CM

## 2019-12-15 DIAGNOSIS — N183 Chronic kidney disease, stage 3 unspecified: Secondary | ICD-10-CM

## 2019-12-15 LAB — SAMPLE TO BLOOD BANK

## 2019-12-15 LAB — CBC WITH DIFFERENTIAL (CANCER CENTER ONLY)
Abs Immature Granulocytes: 0.03 10*3/uL (ref 0.00–0.07)
Basophils Absolute: 0 10*3/uL (ref 0.0–0.1)
Basophils Relative: 0 %
Eosinophils Absolute: 0 10*3/uL (ref 0.0–0.5)
Eosinophils Relative: 1 %
HCT: 30.7 % — ABNORMAL LOW (ref 36.0–46.0)
Hemoglobin: 9.5 g/dL — ABNORMAL LOW (ref 12.0–15.0)
Immature Granulocytes: 0 %
Lymphocytes Relative: 8 %
Lymphs Abs: 0.6 10*3/uL — ABNORMAL LOW (ref 0.7–4.0)
MCH: 31.6 pg (ref 26.0–34.0)
MCHC: 30.9 g/dL (ref 30.0–36.0)
MCV: 102 fL — ABNORMAL HIGH (ref 80.0–100.0)
Monocytes Absolute: 0.4 10*3/uL (ref 0.1–1.0)
Monocytes Relative: 6 %
Neutro Abs: 5.7 10*3/uL (ref 1.7–7.7)
Neutrophils Relative %: 85 %
Platelet Count: 248 10*3/uL (ref 150–400)
RBC: 3.01 MIL/uL — ABNORMAL LOW (ref 3.87–5.11)
RDW: 15.2 % (ref 11.5–15.5)
WBC Count: 6.7 10*3/uL (ref 4.0–10.5)
nRBC: 0 % (ref 0.0–0.2)

## 2019-12-15 MED ORDER — EPOETIN ALFA-EPBX 40000 UNIT/ML IJ SOLN
INTRAMUSCULAR | Status: AC
  Filled 2019-12-15: qty 1

## 2019-12-15 MED ORDER — EPOETIN ALFA-EPBX 10000 UNIT/ML IJ SOLN
INTRAMUSCULAR | Status: AC
  Filled 2019-12-15: qty 2

## 2019-12-15 MED ORDER — EPOETIN ALFA-EPBX 10000 UNIT/ML IJ SOLN
20000.0000 [IU] | Freq: Once | INTRAMUSCULAR | Status: AC
  Administered 2019-12-15: 20000 [IU] via SUBCUTANEOUS

## 2019-12-15 MED ORDER — EPOETIN ALFA-EPBX 40000 UNIT/ML IJ SOLN
40000.0000 [IU] | Freq: Once | INTRAMUSCULAR | Status: AC
  Administered 2019-12-15: 40000 [IU] via SUBCUTANEOUS

## 2019-12-15 NOTE — Progress Notes (Signed)
[place] OFFICE PROGRESS NOTE   Diagnosis: Anemia, renal failure  INTERVAL HISTORY:   Tricia Gonzales returns as scheduled.  She is currently maintained on every 3-week erythropoietin.  No bleeding.  She is taking iron.  She received IV iron on 11/01/2019 and 11/08/2019.  She has developed an abdominal hernia.  Objective:  Vital signs in last 24 hours:  Blood pressure (!) 141/64, pulse 69, temperature 97.7 F (36.5 C), temperature source Temporal, resp. rate 20, height _0  (1.448 m), weight 173 lb 1.6 oz (78.5 kg), SpO2 100 %.    Resp: Inspiratory rhonchi at the left posterior base, no respiratory distress Cardio: Irregular GI: Reducible right lower abdominal hernia to the right of a midline incision Vascular: The right lower leg is larger than the left side, chronic skin thickening/scarring of the left lower leg  Lab Results:  Lab Results  Component Value Date   WBC 6.7 12/15/2019   HGB 9.5 (L) 12/15/2019   HCT 30.7 (L) 12/15/2019   MCV 102.0 (H) 12/15/2019   PLT 248 12/15/2019   NEUTROABS 5.7 12/15/2019    CMP  Lab Results  Component Value Date   NA 138 02/21/2019   K 4.4 02/21/2019   CL 98 02/21/2019   CO2 30 02/21/2019   GLUCOSE 144 (H) 02/21/2019   BUN 61 (H) 02/21/2019   CREATININE 2.11 (H) 02/21/2019   CALCIUM 10.0 02/21/2019   PROT 6.1 (L) 07/05/2018   ALBUMIN 3.3 (L) 07/05/2018   AST 12 (L) 07/05/2018   ALT 8 07/05/2018   ALKPHOS 59 07/05/2018   BILITOT 0.3 07/05/2018   GFRNONAA 22 (L) 02/21/2019   GFRAA 26 (L) 02/21/2019    Medications: I have reviewed the patient's current medications.   Assessment/Plan: 1. Normocytic anemia-chronic  normal ferritin, serum iron studies-low percent transferrin saturation, normal transferrin  Hemoccult positive stool February 2017, upper endoscopy 08/12/2015 with no source for bleeding  09/27/2015 erythropoietin 40,000 units weekly initiated  09/27/2015 erythropoietin level 48.3 (range  2.6-18.5)  10/11/2015 serum protein electrophoresis with no M spike observed; serum IFE shows IgA monoclonal protein with lambda light chain specificity; quantitative immunoglobulins show IgG mildly decreased 654 (range [phone]), IgA and IgM in normal range; polyclonal serum light chain elevation  11/15/2015-hemoglobin 11.2  11/29/2015-hemoglobin 11.1  12/12/2005-hemoglobin 10.4; Aranesp 100 g every 2 weeks initiated  02/07/2016-hemoglobin 11.0, Aranesp held, changed to an every 3 week schedule  Progressive anemia and decreased iron stores December 2017, treated with IV iron January 2018 with improvement  She did not require Aranesp for 2 months and resumed Aranesp on a monthly schedule for 08/2016  Aranesp every 6 weeks beginning 05/04/2017  Progressive anemia and decreased iron stores 07/05/2017; IV irongiven2/11/2017 and 08/06/2017  Persistent severe anemia, Aranesp change to every 2 weeks beginning 10/19/2017  Persistent anemia, decreased iron stores 11/30/2017  IV iron 12/13/2017, 12/20/2017,  Decreased iron stores 05/17/2018  IV iron1/08/2018  07/05/2018-no serum M spike, polyclonal serum light chain elevation, IgG mildly decreased with IgA and IgM within normal range  2 units of blood 08/17/2018  IV iron 08/17/2018 and 08/24/2018  Stool heme +08/30/2018, referred to GI  2 units of blood 09/01/2018  1 unit of blood 10/25/2018  11/11/2018 colonoscopy-diverticulosis in the entire examined colon. End-to-end colocolonic anastomosis. One 5 mm polyp in the ascending colon. No cause for anemia.  11/11/2018 upper endoscopy-evidence of gastric antral vascular ectasia which is the likely cause of the patient's worsening anemia.  11/24/2018 upper endoscopy-severe gastric antral vascular  ectasia with active oozing treated with argon plasma coagulation.  Hemoglobin stabilized following treatment of theGave  Every 6-week erythropoietin 06/22/2019, erythropoietin dose increased to  60,000 units 09/14/2019  Erythropoietin 60,000 units every 3 weeks 10/24/2019  2. Chronic renal insufficiency  3. Atrial fibrillation-maintained on apixaban  4. Osteoarthritis  5. Diabetes  6. Sjogren's disease  7. Pericarditis February 2017  8.Colonoscopy 01/07/2012-prior segmental colectomy in the sigmoid colon; moderate diverticulosis throughout the colon; otherwise normal examination    Disposition: The hemoglobin has improved over the past month.  She will receive another dose of erythropoietin today.  Tricia Gonzales would like to change to an every 4-week schedule.  She will continue oral iron.  We will check iron studies when she is here next month.  She will be scheduled for an office visit in 2 months.  Tricia Coder, MD  12/15/2019  11:26 AM

## 2019-12-18 ENCOUNTER — Telehealth: Payer: Self-pay | Admitting: Oncology

## 2019-12-18 NOTE — Telephone Encounter (Signed)
Scheduled appts per 6/25 los. Left voicmail with appt dates and times. Mailed appt reminder and calendar.

## 2020-01-05 ENCOUNTER — Ambulatory Visit (INDEPENDENT_AMBULATORY_CARE_PROVIDER_SITE_OTHER): Payer: Medicare Other | Admitting: *Deleted

## 2020-01-05 DIAGNOSIS — I442 Atrioventricular block, complete: Secondary | ICD-10-CM | POA: Diagnosis not present

## 2020-01-07 LAB — CUP PACEART REMOTE DEVICE CHECK
Battery Remaining Longevity: 56 mo
Battery Voltage: 2.99 V
Brady Statistic AP VP Percent: 41.36 %
Brady Statistic AP VS Percent: 0.06 %
Brady Statistic AS VP Percent: 55.79 %
Brady Statistic AS VS Percent: 2.79 %
Brady Statistic RA Percent Paced: 41.29 %
Brady Statistic RV Percent Paced: 96.51 %
Date Time Interrogation Session: 20210716134709
Implantable Lead Implant Date: 20161227
Implantable Lead Implant Date: 20161227
Implantable Lead Location: 753859
Implantable Lead Location: 753860
Implantable Lead Model: 5076
Implantable Lead Model: 5076
Implantable Pulse Generator Implant Date: 20161227
Lead Channel Impedance Value: 285 Ohm
Lead Channel Impedance Value: 304 Ohm
Lead Channel Impedance Value: 342 Ohm
Lead Channel Impedance Value: 380 Ohm
Lead Channel Pacing Threshold Amplitude: 0.625 V
Lead Channel Pacing Threshold Amplitude: 1.125 V
Lead Channel Pacing Threshold Pulse Width: 0.4 ms
Lead Channel Pacing Threshold Pulse Width: 0.4 ms
Lead Channel Sensing Intrinsic Amplitude: 1.75 mV
Lead Channel Sensing Intrinsic Amplitude: 1.75 mV
Lead Channel Sensing Intrinsic Amplitude: 15.375 mV
Lead Channel Sensing Intrinsic Amplitude: 15.375 mV
Lead Channel Setting Pacing Amplitude: 2 V
Lead Channel Setting Pacing Amplitude: 2.5 V
Lead Channel Setting Pacing Pulse Width: 0.4 ms
Lead Channel Setting Sensing Sensitivity: 2.8 mV

## 2020-01-09 NOTE — Progress Notes (Signed)
Remote pacemaker transmission.   

## 2020-01-16 ENCOUNTER — Inpatient Hospital Stay: Payer: Medicare Other | Attending: Oncology

## 2020-01-16 ENCOUNTER — Other Ambulatory Visit: Payer: Self-pay

## 2020-01-16 ENCOUNTER — Inpatient Hospital Stay: Payer: Medicare Other

## 2020-01-16 VITALS — BP 159/70 | HR 79 | Temp 98.2°F | Resp 18

## 2020-01-16 DIAGNOSIS — N189 Chronic kidney disease, unspecified: Secondary | ICD-10-CM

## 2020-01-16 DIAGNOSIS — M3500 Sicca syndrome, unspecified: Secondary | ICD-10-CM | POA: Insufficient documentation

## 2020-01-16 DIAGNOSIS — D631 Anemia in chronic kidney disease: Secondary | ICD-10-CM | POA: Insufficient documentation

## 2020-01-16 DIAGNOSIS — M1990 Unspecified osteoarthritis, unspecified site: Secondary | ICD-10-CM | POA: Insufficient documentation

## 2020-01-16 DIAGNOSIS — I4891 Unspecified atrial fibrillation: Secondary | ICD-10-CM | POA: Diagnosis not present

## 2020-01-16 DIAGNOSIS — E1122 Type 2 diabetes mellitus with diabetic chronic kidney disease: Secondary | ICD-10-CM | POA: Diagnosis not present

## 2020-01-16 DIAGNOSIS — N183 Chronic kidney disease, stage 3 unspecified: Secondary | ICD-10-CM | POA: Diagnosis present

## 2020-01-16 DIAGNOSIS — Z79899 Other long term (current) drug therapy: Secondary | ICD-10-CM | POA: Insufficient documentation

## 2020-01-16 LAB — CBC WITH DIFFERENTIAL (CANCER CENTER ONLY)
Abs Immature Granulocytes: 0.03 10*3/uL (ref 0.00–0.07)
Basophils Absolute: 0 10*3/uL (ref 0.0–0.1)
Basophils Relative: 0 %
Eosinophils Absolute: 0 10*3/uL (ref 0.0–0.5)
Eosinophils Relative: 0 %
HCT: 28.6 % — ABNORMAL LOW (ref 36.0–46.0)
Hemoglobin: 8.8 g/dL — ABNORMAL LOW (ref 12.0–15.0)
Immature Granulocytes: 0 %
Lymphocytes Relative: 8 %
Lymphs Abs: 0.6 10*3/uL — ABNORMAL LOW (ref 0.7–4.0)
MCH: 31 pg (ref 26.0–34.0)
MCHC: 30.8 g/dL (ref 30.0–36.0)
MCV: 100.7 fL — ABNORMAL HIGH (ref 80.0–100.0)
Monocytes Absolute: 0.4 10*3/uL (ref 0.1–1.0)
Monocytes Relative: 6 %
Neutro Abs: 5.7 10*3/uL (ref 1.7–7.7)
Neutrophils Relative %: 86 %
Platelet Count: 246 10*3/uL (ref 150–400)
RBC: 2.84 MIL/uL — ABNORMAL LOW (ref 3.87–5.11)
RDW: 14.6 % (ref 11.5–15.5)
WBC Count: 6.7 10*3/uL (ref 4.0–10.5)
nRBC: 0 % (ref 0.0–0.2)

## 2020-01-16 LAB — IRON AND TIBC
Iron: 65 ug/dL (ref 41–142)
Saturation Ratios: 21 % (ref 21–57)
TIBC: 308 ug/dL (ref 236–444)
UIBC: 243 ug/dL (ref 120–384)

## 2020-01-16 LAB — FERRITIN: Ferritin: 89 ng/mL (ref 11–307)

## 2020-01-16 MED ORDER — EPOETIN ALFA-EPBX 10000 UNIT/ML IJ SOLN
INTRAMUSCULAR | Status: AC
  Filled 2020-01-16: qty 2

## 2020-01-16 MED ORDER — EPOETIN ALFA-EPBX 40000 UNIT/ML IJ SOLN
INTRAMUSCULAR | Status: AC
  Filled 2020-01-16: qty 1

## 2020-01-16 MED ORDER — EPOETIN ALFA-EPBX 10000 UNIT/ML IJ SOLN
20000.0000 [IU] | Freq: Once | INTRAMUSCULAR | Status: AC
  Administered 2020-01-16: 20000 [IU] via SUBCUTANEOUS

## 2020-01-16 MED ORDER — EPOETIN ALFA-EPBX 40000 UNIT/ML IJ SOLN
40000.0000 [IU] | Freq: Once | INTRAMUSCULAR | Status: AC
  Administered 2020-01-16: 40000 [IU] via SUBCUTANEOUS

## 2020-01-16 NOTE — Patient Instructions (Signed)

## 2020-01-22 ENCOUNTER — Telehealth: Payer: Self-pay | Admitting: *Deleted

## 2020-01-22 LAB — CUP PACEART INCLINIC DEVICE CHECK
Brady Statistic AP VP Percent: 64.7 %
Brady Statistic AP VS Percent: 1.1 %
Brady Statistic AS VP Percent: 19 %
Brady Statistic AS VS Percent: 15.2 %
Date Time Interrogation Session: 20210317091519
Implantable Lead Implant Date: 20161227
Implantable Lead Implant Date: 20161227
Implantable Lead Location: 753859
Implantable Lead Location: 753860
Implantable Lead Model: 5076
Implantable Lead Model: 5076
Implantable Pulse Generator Implant Date: 20161227
Lead Channel Pacing Threshold Amplitude: 0.75 V
Lead Channel Pacing Threshold Amplitude: 1 V
Lead Channel Pacing Threshold Pulse Width: 0.4 ms
Lead Channel Pacing Threshold Pulse Width: 0.4 ms
Lead Channel Sensing Intrinsic Amplitude: 17.5 mV
Lead Channel Sensing Intrinsic Amplitude: 2.5 mV

## 2020-01-22 NOTE — Telephone Encounter (Signed)
-----  Message from Ladell Pier, MD sent at 01/19/2020  4:51 PM EDT ----- Please call patient, ferritin is better than in early May, but remains below goal range (100), and hemoglobin is lower  I recommend IV iron weekly x2 beginning with the next visit here  Please see if she will agree to this and Lattie Haw or I can put in the orders

## 2020-01-22 NOTE — Telephone Encounter (Signed)
Left VM for patient to return call to discuss lab results. Next appointment 02/13/20.

## 2020-01-23 ENCOUNTER — Telehealth: Payer: Self-pay

## 2020-01-23 NOTE — Telephone Encounter (Signed)
Pt. Returning call from [facility] yesterday, Informed Pt that Dr. Benay Spice would like her to have IV iron  x 2 starting next visit. Pt stated she will wait to see Dr. Benay Spice before she decides to do IV Iron, and requested to change upcoming appointment on 02/13/20.

## 2020-02-13 ENCOUNTER — Ambulatory Visit: Payer: Medicare Other

## 2020-02-13 ENCOUNTER — Other Ambulatory Visit: Payer: Medicare Other

## 2020-02-13 ENCOUNTER — Ambulatory Visit: Payer: Medicare Other | Admitting: Nurse Practitioner

## 2020-02-14 ENCOUNTER — Encounter: Payer: Self-pay | Admitting: Nurse Practitioner

## 2020-02-14 ENCOUNTER — Inpatient Hospital Stay: Payer: Medicare Other

## 2020-02-14 ENCOUNTER — Inpatient Hospital Stay (HOSPITAL_BASED_OUTPATIENT_CLINIC_OR_DEPARTMENT_OTHER): Payer: Medicare Other | Admitting: Nurse Practitioner

## 2020-02-14 ENCOUNTER — Inpatient Hospital Stay: Payer: Medicare Other | Attending: Oncology

## 2020-02-14 ENCOUNTER — Other Ambulatory Visit: Payer: Self-pay

## 2020-02-14 VITALS — BP 159/88 | HR 62 | Temp 97.9°F | Resp 17 | Ht <= 58 in | Wt 176.3 lb

## 2020-02-14 DIAGNOSIS — Z7901 Long term (current) use of anticoagulants: Secondary | ICD-10-CM | POA: Diagnosis not present

## 2020-02-14 DIAGNOSIS — Z8679 Personal history of other diseases of the circulatory system: Secondary | ICD-10-CM | POA: Insufficient documentation

## 2020-02-14 DIAGNOSIS — D631 Anemia in chronic kidney disease: Secondary | ICD-10-CM

## 2020-02-14 DIAGNOSIS — M3500 Sicca syndrome, unspecified: Secondary | ICD-10-CM | POA: Diagnosis not present

## 2020-02-14 DIAGNOSIS — E1122 Type 2 diabetes mellitus with diabetic chronic kidney disease: Secondary | ICD-10-CM | POA: Insufficient documentation

## 2020-02-14 DIAGNOSIS — N189 Chronic kidney disease, unspecified: Secondary | ICD-10-CM

## 2020-02-14 DIAGNOSIS — N183 Chronic kidney disease, stage 3 unspecified: Secondary | ICD-10-CM

## 2020-02-14 DIAGNOSIS — M1990 Unspecified osteoarthritis, unspecified site: Secondary | ICD-10-CM | POA: Insufficient documentation

## 2020-02-14 DIAGNOSIS — K5730 Diverticulosis of large intestine without perforation or abscess without bleeding: Secondary | ICD-10-CM | POA: Diagnosis not present

## 2020-02-14 LAB — CBC WITH DIFFERENTIAL (CANCER CENTER ONLY)
Abs Immature Granulocytes: 0.02 10*3/uL (ref 0.00–0.07)
Basophils Absolute: 0 10*3/uL (ref 0.0–0.1)
Basophils Relative: 0 %
Eosinophils Absolute: 0 10*3/uL (ref 0.0–0.5)
Eosinophils Relative: 0 %
HCT: 28.3 % — ABNORMAL LOW (ref 36.0–46.0)
Hemoglobin: 8.7 g/dL — ABNORMAL LOW (ref 12.0–15.0)
Immature Granulocytes: 0 %
Lymphocytes Relative: 11 %
Lymphs Abs: 0.7 10*3/uL (ref 0.7–4.0)
MCH: 30.1 pg (ref 26.0–34.0)
MCHC: 30.7 g/dL (ref 30.0–36.0)
MCV: 97.9 fL (ref 80.0–100.0)
Monocytes Absolute: 0.5 10*3/uL (ref 0.1–1.0)
Monocytes Relative: 9 %
Neutro Abs: 5 10*3/uL (ref 1.7–7.7)
Neutrophils Relative %: 80 %
Platelet Count: 233 10*3/uL (ref 150–400)
RBC: 2.89 MIL/uL — ABNORMAL LOW (ref 3.87–5.11)
RDW: 14.6 % (ref 11.5–15.5)
WBC Count: 6.2 10*3/uL (ref 4.0–10.5)
nRBC: 0 % (ref 0.0–0.2)

## 2020-02-14 MED ORDER — EPOETIN ALFA-EPBX 10000 UNIT/ML IJ SOLN
20000.0000 [IU] | Freq: Once | INTRAMUSCULAR | Status: AC
  Administered 2020-02-14: 20000 [IU] via SUBCUTANEOUS

## 2020-02-14 MED ORDER — EPOETIN ALFA-EPBX 10000 UNIT/ML IJ SOLN
INTRAMUSCULAR | Status: AC
  Filled 2020-02-14: qty 2

## 2020-02-14 MED ORDER — EPOETIN ALFA-EPBX 40000 UNIT/ML IJ SOLN
INTRAMUSCULAR | Status: AC
  Filled 2020-02-14: qty 1

## 2020-02-14 MED ORDER — EPOETIN ALFA-EPBX 40000 UNIT/ML IJ SOLN
40000.0000 [IU] | Freq: Once | INTRAMUSCULAR | Status: AC
  Administered 2020-02-14: 40000 [IU] via SUBCUTANEOUS

## 2020-02-14 NOTE — Progress Notes (Signed)
[place] OFFICE PROGRESS NOTE   Diagnosis: Anemia, renal failure  INTERVAL HISTORY:   Tricia Gonzales returns as scheduled.  She continues every 4-week erythropoietin.  She feels "great".  She denies bleeding.  No shortness of breath except with overexertion.  No cough or fever.  She continues oral iron.  Objective:  Vital signs in last 24 hours:  Blood pressure (!) 159/88, pulse 62, temperature 97.9 F (36.6 C), temperature source Axillary, resp. rate 17, height _0  (1.448 m), weight 176 lb 4.8 oz (80 kg), SpO2 98 %.    Resp: Lungs clear bilaterally. Cardio: Irregular. GI: Abdomen soft and nontender.  No hepatosplenomegaly. Vascular: Trace edema at the lower legs bilaterally.    Lab Results:  Lab Results  Component Value Date   WBC 6.2 02/14/2020   HGB 8.7 (L) 02/14/2020   HCT 28.3 (L) 02/14/2020   MCV 97.9 02/14/2020   PLT 233 02/14/2020   NEUTROABS 5.0 02/14/2020    Imaging:  No results found.  Medications: I have reviewed the patient's current medications.  Assessment/Plan: 1. 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; quantitative immunoglobulins show IgG mildly decreased 654 (range [phone]), IgA and IgM in normal range; polyclonal serum light chain elevation  11/15/2015-hemoglobin 11.2  11/29/2015-hemoglobin 11.1  12/12/2005-hemoglobin 10.4; Aranesp 100 g every 2 weeks initiated  02/07/2016-hemoglobin 11.0, Aranesp held, changed to an every 3 week schedule  Progressive anemia and decreased iron stores December  2017, treated with IV iron January 2018 with improvement  She did not require Aranesp for 2 months and resumed Aranesp on a monthly schedule for 08/2016  Aranesp every 6 weeks beginning 05/04/2017  Progressive anemia and decreased iron stores 07/05/2017; IV irongiven2/11/2017 and 08/06/2017  Persistent severe anemia, Aranesp change to every 2 weeks beginning 10/19/2017  Persistent anemia, decreased iron stores 11/30/2017  IV iron 12/13/2017, 12/20/2017,  Decreased iron stores 05/17/2018  IV iron1/08/2018  07/05/2018-no serum M spike, polyclonal serum light chain elevation, IgG mildly decreased with IgA and IgM within normal range  2 units of blood 08/17/2018  IV iron 08/17/2018 and 08/24/2018  Stool heme +08/30/2018, referred to GI  2 units of blood 09/01/2018  1 unit of blood 10/25/2018  11/11/2018 colonoscopy-diverticulosis in the entire examined colon. End-to-end colocolonic anastomosis. One 5 mm polyp in the ascending colon. No cause for anemia.  11/11/2018 upper endoscopy-evidence of gastric antral vascular ectasia which is the likely cause of the patient's worsening anemia.  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  Hemoglobin stabilized following treatment of theGave  Every 6-week erythropoietin 06/22/2019, erythropoietin dose increased to 60,000 units 09/14/2019  Erythropoietin 60,000 units every 3 weeks 10/24/2019  Schedule changed to every 4 weeks 12/15/2019  2. Chronic renal insufficiency  3. Atrial fibrillation-maintained on apixaban  4. Osteoarthritis  5. Diabetes  6. Sjogren's disease  7. Pericarditis February 2017  8.Colonoscopy 01/07/2012-prior segmental colectomy in the sigmoid colon; moderate diverticulosis throughout the colon; otherwise normal examination  Disposition: [name] appears stable.  We reviewed the CBC from today as well as  the iron studies from 01/16/2020.  Hemoglobin and ferritin are below goal range.  Plan to continue monthly erythropoietin and recommend IV iron weekly x2.  She agrees with this plan.  We will see her in follow-up in 2 months.  She will contact the office in the interim with any problems.    Ned Card ANP/GNP-BC   02/14/2020  12:44 PM

## 2020-02-14 NOTE — Progress Notes (Signed)
Per Ned Card NP ok to give injection with BP 159/88

## 2020-02-14 NOTE — Patient Instructions (Signed)

## 2020-02-15 ENCOUNTER — Telehealth: Payer: Self-pay | Admitting: Nurse Practitioner

## 2020-02-15 NOTE — Telephone Encounter (Signed)
Scheduled appointment per 8/26 los. Gave patient updated calendar at time of service.

## 2020-02-21 ENCOUNTER — Inpatient Hospital Stay: Payer: Medicare Other | Attending: Oncology

## 2020-02-21 ENCOUNTER — Ambulatory Visit: Payer: Medicare Other

## 2020-02-21 ENCOUNTER — Other Ambulatory Visit: Payer: Self-pay | Admitting: Oncology

## 2020-02-21 ENCOUNTER — Other Ambulatory Visit: Payer: Self-pay

## 2020-02-21 VITALS — BP 164/59 | HR 60 | Temp 97.9°F | Resp 16 | Ht <= 58 in | Wt 180.0 lb

## 2020-02-21 DIAGNOSIS — N189 Chronic kidney disease, unspecified: Secondary | ICD-10-CM | POA: Diagnosis not present

## 2020-02-21 DIAGNOSIS — N183 Chronic kidney disease, stage 3 unspecified: Secondary | ICD-10-CM

## 2020-02-21 DIAGNOSIS — D631 Anemia in chronic kidney disease: Secondary | ICD-10-CM | POA: Diagnosis present

## 2020-02-21 MED ORDER — SODIUM CHLORIDE 0.9 % IV SOLN
510.0000 mg | Freq: Once | INTRAVENOUS | Status: AC
  Administered 2020-02-21: 510 mg via INTRAVENOUS
  Filled 2020-02-21: qty 510

## 2020-02-21 MED ORDER — SODIUM CHLORIDE 0.9 % IV SOLN
Freq: Once | INTRAVENOUS | Status: AC
  Filled 2020-02-21: qty 250

## 2020-02-28 ENCOUNTER — Ambulatory Visit: Payer: Medicare Other

## 2020-02-29 ENCOUNTER — Inpatient Hospital Stay: Payer: Medicare Other

## 2020-02-29 ENCOUNTER — Other Ambulatory Visit: Payer: Self-pay

## 2020-02-29 VITALS — BP 176/69 | HR 67 | Temp 97.8°F | Resp 16

## 2020-02-29 DIAGNOSIS — N183 Chronic kidney disease, stage 3 unspecified: Secondary | ICD-10-CM | POA: Diagnosis not present

## 2020-02-29 DIAGNOSIS — D631 Anemia in chronic kidney disease: Secondary | ICD-10-CM

## 2020-02-29 DIAGNOSIS — N189 Chronic kidney disease, unspecified: Secondary | ICD-10-CM

## 2020-02-29 MED ORDER — SODIUM CHLORIDE 0.9 % IV SOLN
510.0000 mg | Freq: Once | INTRAVENOUS | Status: AC
  Administered 2020-02-29: 510 mg via INTRAVENOUS
  Filled 2020-02-29: qty 510

## 2020-02-29 MED ORDER — SODIUM CHLORIDE 0.9 % IV SOLN
Freq: Once | INTRAVENOUS | Status: AC
  Filled 2020-02-29: qty 250

## 2020-02-29 NOTE — Patient Instructions (Signed)

## 2020-03-05 ENCOUNTER — Ambulatory Visit (INDEPENDENT_AMBULATORY_CARE_PROVIDER_SITE_OTHER): Payer: Medicare Other | Admitting: Podiatry

## 2020-03-05 ENCOUNTER — Encounter: Payer: Self-pay | Admitting: Podiatry

## 2020-03-05 ENCOUNTER — Other Ambulatory Visit: Payer: Self-pay

## 2020-03-05 ENCOUNTER — Ambulatory Visit (INDEPENDENT_AMBULATORY_CARE_PROVIDER_SITE_OTHER): Payer: Medicare Other

## 2020-03-05 DIAGNOSIS — G5791 Unspecified mononeuropathy of right lower limb: Secondary | ICD-10-CM | POA: Diagnosis not present

## 2020-03-05 DIAGNOSIS — M7751 Other enthesopathy of right foot: Secondary | ICD-10-CM | POA: Diagnosis not present

## 2020-03-06 NOTE — Progress Notes (Signed)
Subjective:  Patient ID: Tricia Gonzales, female    DOB: 04-20-1941,  MRN: [id number] HPI Chief Complaint  Patient presents with  . Foot Pain    1st toe/interspace throught forefoot right - aching x few months, no injury, hurts more when in bed at night, no treatment  . New Patient (Initial Visit)    79 y.o. female presents with the above complaint.   ROS: Denies fever chills nausea vomiting muscle aches pains calf pain back pain chest pain shortness of breath.  Past Medical History:  Diagnosis Date  . Anemia   . 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 Anxiety   . Arthritis   . Atrial fibrillation ([employer])   . Avascular necrosis (HCC) hip left   leg pain also  . Chronic back pain   . Chronic kidney disease (CKD), stage III (moderate)   . Depression   . Diverticulosis   . Early cataracts, bilateral   . Esophageal stricture   . Gastritis   . GERD (gastroesophageal reflux disease)   . Glaucoma   . Heart murmur     " some doctors say that I have one some say that I don"t "  . Hiatal hernia   . History of blood transfusion    "@ least w/1st knee OR"  . History of gout   . History of kidney stones   . Hyperlipidemia   . Hypertension   . Intestinal obstruction ([place])   . Kidney stones   . Obesity   . Osteoarthritis   . Osteoporosis   . Pericarditis 2016  . Presence of permanent cardiac pacemaker   . Sjogren's disease ([place])   . Thyroid disease   . Type II diabetes mellitus ([place])   . Vitamin B12 deficiency    Past Surgical History:  Procedure Laterality Date  . ABDOMINAL HYSTERECTOMY     complete  . CHOLECYSTECTOMY OPEN    . COLECTOMY     for rectovaginal fistula  . COLONOSCOPY  01/07/2012   Procedure: COLONOSCOPY;  Surgeon: Lafayette Dragon, MD;  Location: [facility];  Service: Endoscopy;  Laterality: N/A;  . EP IMPLANTABLE DEVICE N/A 06/18/2015   Procedure: Pacemaker Implant;  Surgeon: Evans Lance, MD;  Location: [facility];  Service: Cardiovascular;   Laterality: N/A;  . ESOPHAGOGASTRODUODENOSCOPY N/A 11/24/2018   Procedure: ESOPHAGOGASTRODUODENOSCOPY (EGD);  Surgeon: Yetta Flock, MD;  Location: [facility];  Service: Gastroenterology;  Laterality: N/A;  . ESOPHAGOGASTRODUODENOSCOPY (EGD) WITH ESOPHAGEAL DILATION    . ESOPHAGOGASTRODUODENOSCOPY (EGD) WITH PROPOFOL N/A 08/12/2015   Procedure: ESOPHAGOGASTRODUODENOSCOPY (EGD) WITH PROPOFOL;  Surgeon: Manus Gunning, MD;  Location: [facility];  Service: Gastroenterology;  Laterality: N/A;  . EYE SURGERY Bilateral    cataracts  . HOT HEMOSTASIS N/A 11/24/2018   Procedure: HOT HEMOSTASIS (ARGON PLASMA COAGULATION/BICAP);  Surgeon: Yetta Flock, MD;  Location: [facility];  Service: Gastroenterology;  Laterality: N/A;  . I & D EXTREMITY Right 08/06/2016   Procedure: DEBRIDEMENT PIP RIGHT RING FINGER;  Surgeon: Daryll Brod, MD;  Location: [facility];  Service: Orthopedics;  Laterality: Right;  . INSERT / REPLACE / REMOVE PACEMAKER    . JOINT REPLACEMENT    . KNEE ARTHROSCOPY Right   . MASS EXCISION Right 08/06/2016   Procedure: EXCISION CYST;  Surgeon: Daryll Brod, MD;  Location: [facility];  Service: Orthopedics;  Laterality: Right;  . REVISION TOTAL KNEE ARTHROPLASTY Left   . TOTAL KNEE ARTHROPLASTY Bilateral     Current Outpatient Medications:  .  ACCU-CHEK AVIVA PLUS test strip, 1 each by Other route daily. , Disp: , Rfl:  .  acetaminophen (TYLENOL) 650 MG CR tablet, Take 1,300 mg by mouth at bedtime as needed for pain., Disp: , Rfl:  .  BD PEN NEEDLE NANO U/F 32G X 4 MM MISC, See admin instructions., Disp: , Rfl:  .  carvedilol (COREG) 3.125 MG tablet, Take 3.125 mg by mouth 2 (two) times daily., Disp: , Rfl:  .  dorzolamide (TRUSOPT) 2 % ophthalmic solution, Place 1 drop into the right eye 2 (two) times daily., Disp: 10 mL, Rfl: 0 .  ELIQUIS 5 MG TABS tablet, TAKE ONE TABLET TWICE DAILY, Disp: 180 tablet, Rfl: 1 .  Epoetin Alfa (PROCRIT IJ), Inject 1 Dose as directed every 6  (six) weeks. , Disp: , Rfl:  .  febuxostat (ULORIC) 40 MG tablet, Take 1 tablet (40 mg total) by mouth every Monday, Wednesday, and Friday., Disp: 12 tablet, Rfl: 0 .  ferrous sulfate 325 (65 FE) MG EC tablet, Take 1 tablet (325 mg total) by mouth 2 (two) times daily. (Patient taking differently: Take 325 mg by mouth 3 (three) times daily. ), Disp: 60 tablet, Rfl: 3 .  furosemide (LASIX) 40 MG tablet, Take 1 tablet (40 mg total) by mouth daily., Disp: 30 tablet, Rfl: 0 .  gabapentin (NEURONTIN) 300 MG capsule, Take 1 capsule (300 mg total) by mouth daily., Disp: 30 capsule, Rfl: 0 .  gabapentin (NEURONTIN) 400 MG capsule, Take 1 capsule (400 mg total) by mouth at bedtime., Disp: 30 capsule, Rfl: 0 .  JANUVIA 50 MG tablet, Take 1 tablet (50 mg total) by mouth daily., Disp: 30 tablet, Rfl: 0 .  levothyroxine (SYNTHROID) 50 MCG tablet, Take 1 tablet (50 mcg total) by mouth at bedtime., Disp: 30 tablet, Rfl: 0 .  metoprolol succinate (TOPROL XL) 25 MG 24 hr tablet, Take 1 tablet (25 mg total) by mouth daily., Disp: 90 tablet, Rfl: 3 .  montelukast (SINGULAIR) 10 MG tablet, Take 1 tablet (10 mg total) by mouth at bedtime., Disp: 30 tablet, Rfl: 0 .  Multiple Vitamin (MULITIVITAMIN WITH MINERALS) TABS, Take 1 tablet by mouth daily., Disp: , Rfl:  .  nystatin (NYAMYC) powder, APPLY TOPICALLY TO LEFT ABDOMINAL FOLD AND GROIN TWICE A DAY, Disp: 15 g, Rfl: 0 .  pantoprazole (PROTONIX) 40 MG tablet, Take 1 tablet (40 mg total) by mouth daily., Disp: 30 tablet, Rfl: 0 .  pentoxifylline (TRENTAL) 400 MG CR tablet, Take 1 tablet (400 mg total) by mouth daily., Disp: 30 tablet, Rfl: 0 .  Polyethylene Glycol 3350 (MIRALAX PO), Take 17 g by mouth daily., Disp: , Rfl:  .  rOPINIRole (REQUIP) 0.5 MG tablet, SMARTSIG:1 Tablet(s) By Mouth Every Evening, Disp: , Rfl:  .  rosuvastatin (CRESTOR) 5 MG tablet, Take 1 tablet (5 mg total) by mouth daily., Disp: 30 tablet, Rfl: 0 .  senna-docusate (SENOKOT-S) 8.6-50 MG tablet,  Take 1 tablet by mouth at bedtime., Disp: , Rfl:  .  TOUJEO SOLOSTAR 300 UNIT/ML SOPN, Inject 16 Units into the skin every evening. (Patient taking differently: Inject 8 Units into the skin every evening. ), Disp: 1.5 mL, Rfl: 0 .  traMADol (ULTRAM) 50 MG tablet, Take 50 mg by mouth 2 (two) times daily as needed., Disp: , Rfl:  .  traZODone (DESYREL) 50 MG tablet, Take 0.5 tablets (25 mg total) by mouth at bedtime. 0.5 tablet to = 25 mg, Disp: 15 tablet, Rfl: 0 .  TRESIBA FLEXTOUCH  100 UNIT/ML FlexTouch Pen, SMARTSIG:4-8 Unit(s) SUB-Q Daily, Disp: , Rfl:   Allergies  Allergen Reactions  . Norvasc [Amlodipine Besylate] Shortness Of Breath  . Avapro [Irbesartan]     Headaches  . Glucophage [Metformin Hydrochloride] Other (See Comments)    "increases creatinine"  . Lisinopril     Headache   . Losartan     Headache   . Morphine And Related Other (See Comments)    "hallucinations"  . Simvastatin Other (See Comments)    Body aches  . Trulicity [Dulaglutide] Other (See Comments)    Severe mood swings  . Fiber Rash    Pt voiced she is allergic to micro fiber materials/ blankets   Review of Systems Objective:  There were no vitals filed for this visit.  General: Well developed, nourished, in no acute distress, alert and oriented x3   Dermatological: Skin is warm, dry and supple bilateral. Nails x 10 are well maintained; remaining integument appears unremarkable at this time. There are no open sores, no preulcerative lesions, no rash or signs of infection present.  Vascular: Dorsalis Pedis artery and Posterior Tibial artery pedal pulses are 2/4 bilateral with immedate capillary fill time. Pedal hair growth present. No varicosities and no lower extremity edema present bilateral.   Neruologic: Grossly intact via light touch bilateral. Vibratory intact via tuning fork bilateral. Protective threshold with Semmes Wienstein monofilament intact to all pedal sites bilateral. Patellar and Achilles  deep tendon reflexes 2+ bilateral. No Babinski or clonus noted bilateral.   Musculoskeletal: No gross boney pedal deformities bilateral. No pain, crepitus, or limitation noted with foot and ankle range of motion bilateral. Muscular strength 5/5 in all groups tested bilateral.  She has pain on palpation and range of motion of the first metatarsophalangeal joint with pain on palpation to the deep peroneal nerve and the first intermetatarsal space.  Gait: Unassisted, antalgic gait   Radiographs:  Radiographs taken today demonstrate significant osteoarthritis at the level of the first metatarsophalangeal joint of the right foot.  There is significant blurring subchondral sclerosis.  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    Plan: Injected the area today with 10 mg of Kenalog.  If we need to do further injections in the future most likely will have to do dehydrated alcohol secondary to history of AVN left hip.     Kaan Tosh T. [place], [place]

## 2020-03-13 ENCOUNTER — Inpatient Hospital Stay: Payer: Medicare Other

## 2020-03-13 ENCOUNTER — Ambulatory Visit: Payer: Medicare Other

## 2020-03-13 ENCOUNTER — Other Ambulatory Visit: Payer: Medicare Other

## 2020-03-13 ENCOUNTER — Other Ambulatory Visit: Payer: Self-pay

## 2020-03-13 VITALS — BP 180/72 | HR 74 | Temp 98.7°F | Resp 18

## 2020-03-13 DIAGNOSIS — D631 Anemia in chronic kidney disease: Secondary | ICD-10-CM

## 2020-03-13 DIAGNOSIS — N183 Chronic kidney disease, stage 3 unspecified: Secondary | ICD-10-CM | POA: Diagnosis not present

## 2020-03-13 LAB — CBC WITH DIFFERENTIAL (CANCER CENTER ONLY)
Abs Immature Granulocytes: 0.03 10*3/uL (ref 0.00–0.07)
Basophils Absolute: 0 10*3/uL (ref 0.0–0.1)
Basophils Relative: 0 %
Eosinophils Absolute: 0 10*3/uL (ref 0.0–0.5)
Eosinophils Relative: 0 %
HCT: 27.4 % — ABNORMAL LOW (ref 36.0–46.0)
Hemoglobin: 8.5 g/dL — ABNORMAL LOW (ref 12.0–15.0)
Immature Granulocytes: 0 %
Lymphocytes Relative: 9 %
Lymphs Abs: 0.6 10*3/uL — ABNORMAL LOW (ref 0.7–4.0)
MCH: 31.1 pg (ref 26.0–34.0)
MCHC: 31 g/dL (ref 30.0–36.0)
MCV: 100.4 fL — ABNORMAL HIGH (ref 80.0–100.0)
Monocytes Absolute: 0.5 10*3/uL (ref 0.1–1.0)
Monocytes Relative: 8 %
Neutro Abs: 5.6 10*3/uL (ref 1.7–7.7)
Neutrophils Relative %: 83 %
Platelet Count: 292 10*3/uL (ref 150–400)
RBC: 2.73 MIL/uL — ABNORMAL LOW (ref 3.87–5.11)
RDW: 15.9 % — ABNORMAL HIGH (ref 11.5–15.5)
WBC Count: 6.7 10*3/uL (ref 4.0–10.5)
nRBC: 0 % (ref 0.0–0.2)

## 2020-03-13 MED ORDER — EPOETIN ALFA-EPBX 10000 UNIT/ML IJ SOLN
INTRAMUSCULAR | Status: AC
  Filled 2020-03-13: qty 2

## 2020-03-13 MED ORDER — EPOETIN ALFA-EPBX 40000 UNIT/ML IJ SOLN
40000.0000 [IU] | Freq: Once | INTRAMUSCULAR | Status: AC
  Administered 2020-03-13: 40000 [IU] via SUBCUTANEOUS

## 2020-03-13 MED ORDER — EPOETIN ALFA-EPBX 40000 UNIT/ML IJ SOLN
INTRAMUSCULAR | Status: AC
  Filled 2020-03-13: qty 1

## 2020-03-13 MED ORDER — EPOETIN ALFA-EPBX 10000 UNIT/ML IJ SOLN
20000.0000 [IU] | Freq: Once | INTRAMUSCULAR | Status: AC
  Administered 2020-03-13: 20000 [IU] via SUBCUTANEOUS

## 2020-03-13 NOTE — Patient Instructions (Signed)

## 2020-03-14 ENCOUNTER — Other Ambulatory Visit: Payer: Self-pay | Admitting: Nurse Practitioner

## 2020-03-14 DIAGNOSIS — D631 Anemia in chronic kidney disease: Secondary | ICD-10-CM

## 2020-03-21 ENCOUNTER — Other Ambulatory Visit: Payer: Self-pay | Admitting: Internal Medicine

## 2020-03-21 NOTE — Telephone Encounter (Signed)
Eliquis 64m refill request received. Patient is 79years old, weight-81.6kg, Crea-1.69 on 02/13/2020 [facility] and last seen by Dr. TLovena Leon 09/06/2019. Dose is appropriate based on dosing criteria. Will send in refill to requested pharmacy.

## 2020-03-25 ENCOUNTER — Telehealth: Payer: Self-pay | Admitting: *Deleted

## 2020-03-25 NOTE — Telephone Encounter (Signed)
Called to ask if she can have her COVID vaccine when her on 04/10/20? Last Pfizer injection was 07/2019. She will have her flu vaccine the week prior. Scheduling message sent to add on to her appointments that. Instructed patient to bring her vaccine card with her.

## 2020-04-02 ENCOUNTER — Other Ambulatory Visit: Payer: Self-pay

## 2020-04-02 ENCOUNTER — Ambulatory Visit (INDEPENDENT_AMBULATORY_CARE_PROVIDER_SITE_OTHER): Payer: Medicare Other | Admitting: Podiatry

## 2020-04-02 ENCOUNTER — Encounter: Payer: Self-pay | Admitting: Podiatry

## 2020-04-02 DIAGNOSIS — G5791 Unspecified mononeuropathy of right lower limb: Secondary | ICD-10-CM

## 2020-04-02 NOTE — Progress Notes (Signed)
She presents today for follow-up of her neuritis.  States that it lasted for about 3 days and then it was right back to where it was.  Objective: We have previously discussed the use of alcohol/dehydrated alcohol to help with this.  She still has pain on palpation to the deep peroneal nerve first intermetatarsal base of the right foot.  Assessment: Neuritis deep peroneal nerve right.  Plan: At this point she states that she does not want to start on the injection therapy.  I will follow-up with her as needed

## 2020-04-05 ENCOUNTER — Ambulatory Visit (INDEPENDENT_AMBULATORY_CARE_PROVIDER_SITE_OTHER): Payer: Medicare Other

## 2020-04-05 DIAGNOSIS — I442 Atrioventricular block, complete: Secondary | ICD-10-CM | POA: Diagnosis not present

## 2020-04-06 LAB — CUP PACEART REMOTE DEVICE CHECK
Battery Remaining Longevity: 56 mo
Battery Voltage: 2.99 V
Brady Statistic AP VP Percent: 24.02 %
Brady Statistic AP VS Percent: 0.08 %
Brady Statistic AS VP Percent: 74.46 %
Brady Statistic AS VS Percent: 1.44 %
Brady Statistic RA Percent Paced: 24 %
Brady Statistic RV Percent Paced: 97.85 %
Date Time Interrogation Session: 20211015112829
Implantable Lead Implant Date: 20161227
Implantable Lead Implant Date: 20161227
Implantable Lead Location: 753859
Implantable Lead Location: 753860
Implantable Lead Model: 5076
Implantable Lead Model: 5076
Implantable Pulse Generator Implant Date: 20161227
Lead Channel Impedance Value: 304 Ohm
Lead Channel Impedance Value: 323 Ohm
Lead Channel Impedance Value: 342 Ohm
Lead Channel Impedance Value: 342 Ohm
Lead Channel Pacing Threshold Amplitude: 0.625 V
Lead Channel Pacing Threshold Amplitude: 1.125 V
Lead Channel Pacing Threshold Pulse Width: 0.4 ms
Lead Channel Pacing Threshold Pulse Width: 0.4 ms
Lead Channel Sensing Intrinsic Amplitude: 1.875 mV
Lead Channel Sensing Intrinsic Amplitude: 1.875 mV
Lead Channel Sensing Intrinsic Amplitude: 18.5 mV
Lead Channel Sensing Intrinsic Amplitude: 18.5 mV
Lead Channel Setting Pacing Amplitude: 2 V
Lead Channel Setting Pacing Amplitude: 2.5 V
Lead Channel Setting Pacing Pulse Width: 0.4 ms
Lead Channel Setting Sensing Sensitivity: 2.8 mV

## 2020-04-10 ENCOUNTER — Inpatient Hospital Stay: Payer: Medicare Other

## 2020-04-10 ENCOUNTER — Encounter: Payer: Self-pay | Admitting: Nurse Practitioner

## 2020-04-10 ENCOUNTER — Other Ambulatory Visit: Payer: Medicare Other

## 2020-04-10 ENCOUNTER — Inpatient Hospital Stay: Payer: Medicare Other | Attending: Oncology

## 2020-04-10 ENCOUNTER — Inpatient Hospital Stay (HOSPITAL_BASED_OUTPATIENT_CLINIC_OR_DEPARTMENT_OTHER): Payer: Medicare Other | Admitting: Nurse Practitioner

## 2020-04-10 ENCOUNTER — Ambulatory Visit: Payer: Medicare Other

## 2020-04-10 ENCOUNTER — Ambulatory Visit: Payer: Medicare Other | Admitting: Nurse Practitioner

## 2020-04-10 ENCOUNTER — Other Ambulatory Visit: Payer: Self-pay

## 2020-04-10 ENCOUNTER — Ambulatory Visit: Payer: Medicare Other | Admitting: Oncology

## 2020-04-10 VITALS — BP 155/92 | Temp 97.5°F | Resp 18 | Ht <= 58 in | Wt 175.0 lb

## 2020-04-10 DIAGNOSIS — Z23 Encounter for immunization: Secondary | ICD-10-CM

## 2020-04-10 DIAGNOSIS — D631 Anemia in chronic kidney disease: Secondary | ICD-10-CM

## 2020-04-10 DIAGNOSIS — N183 Chronic kidney disease, stage 3 unspecified: Secondary | ICD-10-CM

## 2020-04-10 DIAGNOSIS — N189 Chronic kidney disease, unspecified: Secondary | ICD-10-CM | POA: Diagnosis not present

## 2020-04-10 LAB — CBC WITH DIFFERENTIAL (CANCER CENTER ONLY)
Abs Immature Granulocytes: 0.03 10*3/uL (ref 0.00–0.07)
Basophils Absolute: 0 10*3/uL (ref 0.0–0.1)
Basophils Relative: 0 %
Eosinophils Absolute: 0 10*3/uL (ref 0.0–0.5)
Eosinophils Relative: 0 %
HCT: 28.7 % — ABNORMAL LOW (ref 36.0–46.0)
Hemoglobin: 9 g/dL — ABNORMAL LOW (ref 12.0–15.0)
Immature Granulocytes: 1 %
Lymphocytes Relative: 8 %
Lymphs Abs: 0.5 10*3/uL — ABNORMAL LOW (ref 0.7–4.0)
MCH: 31.4 pg (ref 26.0–34.0)
MCHC: 31.4 g/dL (ref 30.0–36.0)
MCV: 100 fL (ref 80.0–100.0)
Monocytes Absolute: 0.5 10*3/uL (ref 0.1–1.0)
Monocytes Relative: 7 %
Neutro Abs: 5.5 10*3/uL (ref 1.7–7.7)
Neutrophils Relative %: 84 %
Platelet Count: 237 10*3/uL (ref 150–400)
RBC: 2.87 MIL/uL — ABNORMAL LOW (ref 3.87–5.11)
RDW: 15.3 % (ref 11.5–15.5)
WBC Count: 6.5 10*3/uL (ref 4.0–10.5)
nRBC: 0 % (ref 0.0–0.2)

## 2020-04-10 LAB — IRON AND TIBC
Iron: 71 ug/dL (ref 41–142)
Saturation Ratios: 25 % (ref 21–57)
TIBC: 290 ug/dL (ref 236–444)
UIBC: 219 ug/dL (ref 120–384)

## 2020-04-10 LAB — RETICULOCYTES
Immature Retic Fract: 9 % (ref 2.3–15.9)
RBC.: 2.79 MIL/uL — ABNORMAL LOW (ref 3.87–5.11)
Retic Count, Absolute: 54.7 10*3/uL (ref 19.0–186.0)
Retic Ct Pct: 2 % (ref 0.4–3.1)

## 2020-04-10 LAB — FERRITIN: Ferritin: 180 ng/mL (ref 11–307)

## 2020-04-10 MED ORDER — EPOETIN ALFA-EPBX 40000 UNIT/ML IJ SOLN
40000.0000 [IU] | Freq: Once | INTRAMUSCULAR | Status: AC
  Administered 2020-04-10: 40000 [IU] via SUBCUTANEOUS

## 2020-04-10 MED ORDER — EPOETIN ALFA-EPBX 40000 UNIT/ML IJ SOLN
INTRAMUSCULAR | Status: AC
  Filled 2020-04-10: qty 1

## 2020-04-10 MED ORDER — EPOETIN ALFA-EPBX 10000 UNIT/ML IJ SOLN
20000.0000 [IU] | Freq: Once | INTRAMUSCULAR | Status: AC
  Administered 2020-04-10: 20000 [IU] via SUBCUTANEOUS

## 2020-04-10 MED ORDER — EPOETIN ALFA-EPBX 10000 UNIT/ML IJ SOLN
INTRAMUSCULAR | Status: AC
  Filled 2020-04-10: qty 2

## 2020-04-10 NOTE — Progress Notes (Signed)
[facility] OFFICE PROGRESS NOTE   Diagnosis: Anemia, renal failure  INTERVAL HISTORY:   Tricia Gonzales returns as scheduled.  She continues every 4-week erythropoietin.  She received Feraheme 02/21/2020 and 02/29/2020.  She reports feeling well most of the time.  She is intermittently fatigued.  She denies bleeding.  No shortness of breath.  Objective:  Vital signs in last 24 hours:  Blood pressure (!) 155/92, temperature (!) 97.5 F (36.4 C), temperature source Tympanic, resp. rate 18, height _0  (1.448 m), weight 175 lb (79.4 kg), SpO2 99 %.    Resp: Lungs clear bilaterally. Cardio: Irregular. GI: Abdomen soft and nontender.  No hepatosplenomegaly. Vascular: Trace edema at the lower legs bilaterally.   Lab Results:  Lab Results  Component Value Date   WBC 6.5 04/10/2020   HGB 9.0 (L) 04/10/2020   HCT 28.7 (L) 04/10/2020   MCV 100.0 04/10/2020   PLT 237 04/10/2020   NEUTROABS 5.5 04/10/2020    Imaging:  No results found.  Medications: I have reviewed the patient's current medications.  Assessment/Plan: 1. 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; quantitative immunoglobulins show IgG mildly decreased 654 (range [phone]), IgA and IgM in normal range; polyclonal serum light chain elevation  11/15/2015-hemoglobin 11.2  11/29/2015-hemoglobin 11.1  12/12/2005-hemoglobin 10.4; Aranesp 100 g every 2 weeks initiated  02/07/2016-hemoglobin 11.0, Aranesp held, changed to an every 3 week schedule  Progressive anemia and decreased  iron stores December 2017, treated with IV iron January 2018 with improvement  She did not require Aranesp for 2 months and resumed Aranesp on a monthly schedule for 08/2016  Aranesp every 6 weeks beginning 05/04/2017  Progressive anemia and decreased iron stores 07/05/2017; IV irongiven2/11/2017 and 08/06/2017  Persistent severe anemia, Aranesp change to every 2 weeks beginning 10/19/2017  Persistent anemia, decreased iron stores 11/30/2017  IV iron 12/13/2017, 12/20/2017,  Decreased iron stores 05/17/2018  IV iron1/08/2018  07/05/2018-no serum M spike, polyclonal serum light chain elevation, IgG mildly decreased with IgA and IgM within normal range  2 units of blood 08/17/2018  IV iron 08/17/2018 and 08/24/2018  Stool heme +08/30/2018, referred to GI  2 units of blood 09/01/2018  1 unit of blood 10/25/2018  11/11/2018 colonoscopy-diverticulosis in the entire examined colon. End-to-end colocolonic anastomosis. One 5 mm polyp in the ascending colon. No cause for anemia.  11/11/2018 upper endoscopy-evidence of gastric antral vascular ectasia which is the likely cause of the patient's worsening anemia.  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  Hemoglobin stabilized following treatment of theGave  Every 6-week erythropoietin 06/22/2019, erythropoietin dose increased to 60,000 units 09/14/2019  Erythropoietin 60,000 units every 3 weeks 10/24/2019  Schedule changed to every 4 weeks 12/15/2019  IV iron 02/21/2020 and 02/29/2020   2. Chronic renal insufficiency  3. Atrial fibrillation-maintained on apixaban  4. Osteoarthritis  5. Diabetes  6. Sjogren's disease  7. Pericarditis February 2017  8.Colonoscopy 01/07/2012-prior segmental colectomy in the sigmoid colon; moderate diverticulosis throughout the colon; otherwise normal examination  Disposition: Tricia Gonzales  appears stable.  We reviewed the CBC from today.  Hemoglobin is better.  Plan to continue monthly  erythropoietin.  We will follow-up on the iron studies from today with the plan for IV iron as indicated.  She will receive the Covid booster today.  She will return for lab and erythropoietin in 4 weeks.  We will see her in follow-up in 2 months.    Ned Card ANP/GNP-BC   04/10/2020  1:02 PM

## 2020-04-10 NOTE — Progress Notes (Signed)
   Covid-19 Vaccination Clinic  Name:  BENTLEY HARALSON    MRN: [id number] DOB: Aug 20, 1940  04/10/2020  Ms. Heuerman was observed post Covid-19 immunization for 15 minutes without incident. She was provided with Vaccine Information Sheet and instruction to access the V-Safe system.   Ms. Wellman was instructed to call 911 with any severe reactions post vaccine: Marland Kitchen Difficulty breathing  . Swelling of face and throat  . A fast heartbeat  . A bad rash all over body  . Dizziness and weakness

## 2020-04-10 NOTE — Progress Notes (Signed)
Remote pacemaker transmission.   

## 2020-04-11 ENCOUNTER — Telehealth: Payer: Self-pay | Admitting: Nurse Practitioner

## 2020-04-11 NOTE — Telephone Encounter (Signed)
Scheduled appointment per 10/20 los. Spoke to patient who is aware of appointment date and time.

## 2020-05-06 ENCOUNTER — Encounter: Payer: Self-pay | Admitting: *Deleted

## 2020-05-06 NOTE — Progress Notes (Unsigned)
MD out of office on 06/05/20-scheduling message sent to move out MD visit to next injection w/lab/OV/injection (~ 3weeks later).

## 2020-05-08 ENCOUNTER — Ambulatory Visit: Payer: Medicare Other

## 2020-05-08 ENCOUNTER — Other Ambulatory Visit: Payer: Medicare Other

## 2020-05-09 ENCOUNTER — Other Ambulatory Visit: Payer: Self-pay

## 2020-05-09 ENCOUNTER — Inpatient Hospital Stay: Payer: Medicare Other | Attending: Oncology

## 2020-05-09 ENCOUNTER — Inpatient Hospital Stay: Payer: Medicare Other

## 2020-05-09 VITALS — BP 148/73 | HR 96 | Temp 97.9°F | Resp 18

## 2020-05-09 DIAGNOSIS — N183 Chronic kidney disease, stage 3 unspecified: Secondary | ICD-10-CM | POA: Diagnosis present

## 2020-05-09 DIAGNOSIS — N189 Chronic kidney disease, unspecified: Secondary | ICD-10-CM

## 2020-05-09 DIAGNOSIS — D631 Anemia in chronic kidney disease: Secondary | ICD-10-CM

## 2020-05-09 LAB — CBC WITH DIFFERENTIAL (CANCER CENTER ONLY)
Abs Immature Granulocytes: 0.02 10*3/uL (ref 0.00–0.07)
Basophils Absolute: 0 10*3/uL (ref 0.0–0.1)
Basophils Relative: 0 %
Eosinophils Absolute: 0.1 10*3/uL (ref 0.0–0.5)
Eosinophils Relative: 1 %
HCT: 26.7 % — ABNORMAL LOW (ref 36.0–46.0)
Hemoglobin: 8.2 g/dL — ABNORMAL LOW (ref 12.0–15.0)
Immature Granulocytes: 0 %
Lymphocytes Relative: 9 %
Lymphs Abs: 0.5 10*3/uL — ABNORMAL LOW (ref 0.7–4.0)
MCH: 31.1 pg (ref 26.0–34.0)
MCHC: 30.7 g/dL (ref 30.0–36.0)
MCV: 101.1 fL — ABNORMAL HIGH (ref 80.0–100.0)
Monocytes Absolute: 0.5 10*3/uL (ref 0.1–1.0)
Monocytes Relative: 8 %
Neutro Abs: 5.3 10*3/uL (ref 1.7–7.7)
Neutrophils Relative %: 82 %
Platelet Count: 245 10*3/uL (ref 150–400)
RBC: 2.64 MIL/uL — ABNORMAL LOW (ref 3.87–5.11)
RDW: 14.8 % (ref 11.5–15.5)
WBC Count: 6.4 10*3/uL (ref 4.0–10.5)
nRBC: 0 % (ref 0.0–0.2)

## 2020-05-09 MED ORDER — EPOETIN ALFA-EPBX 10000 UNIT/ML IJ SOLN
INTRAMUSCULAR | Status: AC
  Filled 2020-05-09: qty 2

## 2020-05-09 MED ORDER — EPOETIN ALFA-EPBX 40000 UNIT/ML IJ SOLN
40000.0000 [IU] | Freq: Once | INTRAMUSCULAR | Status: AC
  Administered 2020-05-09: 40000 [IU] via SUBCUTANEOUS

## 2020-05-09 MED ORDER — EPOETIN ALFA-EPBX 40000 UNIT/ML IJ SOLN
INTRAMUSCULAR | Status: AC
  Filled 2020-05-09: qty 1

## 2020-05-09 MED ORDER — EPOETIN ALFA-EPBX 10000 UNIT/ML IJ SOLN
20000.0000 [IU] | Freq: Once | INTRAMUSCULAR | Status: AC
  Administered 2020-05-09: 20000 [IU] via SUBCUTANEOUS

## 2020-05-14 ENCOUNTER — Other Ambulatory Visit: Payer: Self-pay | Admitting: Nurse Practitioner

## 2020-05-14 ENCOUNTER — Telehealth: Payer: Self-pay

## 2020-05-14 DIAGNOSIS — N189 Chronic kidney disease, unspecified: Secondary | ICD-10-CM

## 2020-05-14 DIAGNOSIS — D631 Anemia in chronic kidney disease: Secondary | ICD-10-CM

## 2020-05-14 NOTE — Telephone Encounter (Signed)
-----  Message from Owens Shark, NP sent at 05/14/2020  1:35 PM EST ----- Please let her know hemoglobin was lower when she was here for Procrit injection 05/09/2020.  Dr. Gearldine Shown recommendation is to change the Procrit schedule from every 4 weeks to every 2 weeks, next lab and injection 05/23/2020.  If she agrees to this change please send LOS indicating lab and injection every 2 weeks beginning 05/23/2020.

## 2020-05-14 NOTE — Telephone Encounter (Signed)
Spoke with pt concerning labs and new injection schedule pt is agreeable

## 2020-05-15 ENCOUNTER — Telehealth: Payer: Self-pay | Admitting: Oncology

## 2020-05-15 NOTE — Telephone Encounter (Signed)
Scheduled appt per 11/23 sch msg - unable to reach pt . Left message for patient with apt date and time

## 2020-05-23 ENCOUNTER — Other Ambulatory Visit: Payer: Self-pay

## 2020-05-23 ENCOUNTER — Inpatient Hospital Stay: Payer: Medicare Other

## 2020-05-23 ENCOUNTER — Inpatient Hospital Stay: Payer: Medicare Other | Attending: Oncology

## 2020-05-23 VITALS — BP 155/88 | HR 95 | Temp 98.1°F | Resp 18

## 2020-05-23 DIAGNOSIS — D631 Anemia in chronic kidney disease: Secondary | ICD-10-CM

## 2020-05-23 DIAGNOSIS — N183 Chronic kidney disease, stage 3 unspecified: Secondary | ICD-10-CM | POA: Insufficient documentation

## 2020-05-23 DIAGNOSIS — N189 Chronic kidney disease, unspecified: Secondary | ICD-10-CM

## 2020-05-23 LAB — SAMPLE TO BLOOD BANK

## 2020-05-23 LAB — IRON AND TIBC
Iron: 47 ug/dL (ref 41–142)
Saturation Ratios: 14 % — ABNORMAL LOW (ref 21–57)
TIBC: 338 ug/dL (ref 236–444)
UIBC: 291 ug/dL (ref 120–384)

## 2020-05-23 LAB — CBC WITH DIFFERENTIAL (CANCER CENTER ONLY)
Abs Immature Granulocytes: 0.01 10*3/uL (ref 0.00–0.07)
Basophils Absolute: 0 10*3/uL (ref 0.0–0.1)
Basophils Relative: 0 %
Eosinophils Absolute: 0 10*3/uL (ref 0.0–0.5)
Eosinophils Relative: 1 %
HCT: 27.1 % — ABNORMAL LOW (ref 36.0–46.0)
Hemoglobin: 8.2 g/dL — ABNORMAL LOW (ref 12.0–15.0)
Immature Granulocytes: 0 %
Lymphocytes Relative: 7 %
Lymphs Abs: 0.4 10*3/uL — ABNORMAL LOW (ref 0.7–4.0)
MCH: 30.4 pg (ref 26.0–34.0)
MCHC: 30.3 g/dL (ref 30.0–36.0)
MCV: 100.4 fL — ABNORMAL HIGH (ref 80.0–100.0)
Monocytes Absolute: 0.4 10*3/uL (ref 0.1–1.0)
Monocytes Relative: 7 %
Neutro Abs: 4.9 10*3/uL (ref 1.7–7.7)
Neutrophils Relative %: 85 %
Platelet Count: 245 10*3/uL (ref 150–400)
RBC: 2.7 MIL/uL — ABNORMAL LOW (ref 3.87–5.11)
RDW: 15.2 % (ref 11.5–15.5)
WBC Count: 5.8 10*3/uL (ref 4.0–10.5)
nRBC: 0 % (ref 0.0–0.2)

## 2020-05-23 LAB — FERRITIN: Ferritin: 43 ng/mL (ref 11–307)

## 2020-05-23 MED ORDER — EPOETIN ALFA-EPBX 10000 UNIT/ML IJ SOLN
INTRAMUSCULAR | Status: AC
  Filled 2020-05-23: qty 2

## 2020-05-23 MED ORDER — EPOETIN ALFA-EPBX 10000 UNIT/ML IJ SOLN
20000.0000 [IU] | Freq: Once | INTRAMUSCULAR | Status: AC
  Administered 2020-05-23: 20000 [IU] via SUBCUTANEOUS

## 2020-05-23 MED ORDER — EPOETIN ALFA-EPBX 40000 UNIT/ML IJ SOLN
INTRAMUSCULAR | Status: AC
  Filled 2020-05-23: qty 1

## 2020-05-23 MED ORDER — EPOETIN ALFA-EPBX 40000 UNIT/ML IJ SOLN
40000.0000 [IU] | Freq: Once | INTRAMUSCULAR | Status: AC
  Administered 2020-05-23: 40000 [IU] via SUBCUTANEOUS

## 2020-05-24 ENCOUNTER — Telehealth: Payer: Self-pay | Admitting: *Deleted

## 2020-05-24 NOTE — Telephone Encounter (Signed)
Left VM for patient to return call to discuss her lab results.

## 2020-05-28 ENCOUNTER — Telehealth: Payer: Self-pay | Admitting: *Deleted

## 2020-05-28 ENCOUNTER — Telehealth: Payer: Self-pay | Admitting: Oncology

## 2020-05-28 NOTE — Telephone Encounter (Signed)
Patient reports she can't make any appointment on 12/17, but can come any other day that week. Agrees to move 12/17 to another day w/lab/injection only. Will see MD/NP with next injection appointment and have her 1st Feraheme infusion followed 1 week later by 2nd dose. Scheduling message sent.

## 2020-05-28 NOTE — Telephone Encounter (Signed)
Scheduled appt per 12/7 sch msg - pt is aware of appt date and time

## 2020-06-04 ENCOUNTER — Inpatient Hospital Stay: Payer: Medicare Other

## 2020-06-04 ENCOUNTER — Other Ambulatory Visit: Payer: Self-pay

## 2020-06-04 VITALS — BP 160/88 | HR 96 | Resp 18

## 2020-06-04 DIAGNOSIS — D631 Anemia in chronic kidney disease: Secondary | ICD-10-CM

## 2020-06-04 DIAGNOSIS — N183 Chronic kidney disease, stage 3 unspecified: Secondary | ICD-10-CM | POA: Diagnosis not present

## 2020-06-04 LAB — CBC WITH DIFFERENTIAL (CANCER CENTER ONLY)
Abs Immature Granulocytes: 0.02 10*3/uL (ref 0.00–0.07)
Basophils Absolute: 0 10*3/uL (ref 0.0–0.1)
Basophils Relative: 0 %
Eosinophils Absolute: 0 10*3/uL (ref 0.0–0.5)
Eosinophils Relative: 0 %
HCT: 29.5 % — ABNORMAL LOW (ref 36.0–46.0)
Hemoglobin: 8.9 g/dL — ABNORMAL LOW (ref 12.0–15.0)
Immature Granulocytes: 0 %
Lymphocytes Relative: 7 %
Lymphs Abs: 0.4 10*3/uL — ABNORMAL LOW (ref 0.7–4.0)
MCH: 29.9 pg (ref 26.0–34.0)
MCHC: 30.2 g/dL (ref 30.0–36.0)
MCV: 99 fL (ref 80.0–100.0)
Monocytes Absolute: 0.5 10*3/uL (ref 0.1–1.0)
Monocytes Relative: 8 %
Neutro Abs: 5.3 10*3/uL (ref 1.7–7.7)
Neutrophils Relative %: 85 %
Platelet Count: 277 10*3/uL (ref 150–400)
RBC: 2.98 MIL/uL — ABNORMAL LOW (ref 3.87–5.11)
RDW: 15.5 % (ref 11.5–15.5)
WBC Count: 6.3 10*3/uL (ref 4.0–10.5)
nRBC: 0 % (ref 0.0–0.2)

## 2020-06-04 LAB — IRON AND TIBC
Iron: 103 ug/dL (ref 41–142)
Saturation Ratios: 28 % (ref 21–57)
TIBC: 364 ug/dL (ref 236–444)
UIBC: 261 ug/dL (ref 120–384)

## 2020-06-04 LAB — FERRITIN: Ferritin: 38 ng/mL (ref 11–307)

## 2020-06-04 MED ORDER — EPOETIN ALFA-EPBX 40000 UNIT/ML IJ SOLN
INTRAMUSCULAR | Status: AC
  Filled 2020-06-04: qty 1

## 2020-06-04 MED ORDER — EPOETIN ALFA-EPBX 40000 UNIT/ML IJ SOLN
40000.0000 [IU] | Freq: Once | INTRAMUSCULAR | Status: AC
  Administered 2020-06-04: 40000 [IU] via SUBCUTANEOUS

## 2020-06-04 MED ORDER — EPOETIN ALFA-EPBX 10000 UNIT/ML IJ SOLN
INTRAMUSCULAR | Status: AC
  Filled 2020-06-04: qty 2

## 2020-06-04 MED ORDER — EPOETIN ALFA-EPBX 10000 UNIT/ML IJ SOLN
20000.0000 [IU] | Freq: Once | INTRAMUSCULAR | Status: AC
  Administered 2020-06-04: 20000 [IU] via SUBCUTANEOUS

## 2020-06-04 NOTE — Patient Instructions (Signed)

## 2020-06-05 ENCOUNTER — Ambulatory Visit: Payer: Medicare Other

## 2020-06-05 ENCOUNTER — Ambulatory Visit: Payer: Medicare Other | Admitting: Oncology

## 2020-06-05 ENCOUNTER — Other Ambulatory Visit: Payer: Medicare Other

## 2020-06-07 ENCOUNTER — Ambulatory Visit: Payer: Medicare Other | Admitting: Oncology

## 2020-06-07 ENCOUNTER — Ambulatory Visit: Payer: Medicare Other

## 2020-06-07 ENCOUNTER — Other Ambulatory Visit: Payer: Medicare Other

## 2020-06-24 ENCOUNTER — Other Ambulatory Visit: Payer: Self-pay | Admitting: *Deleted

## 2020-06-24 DIAGNOSIS — D631 Anemia in chronic kidney disease: Secondary | ICD-10-CM

## 2020-06-24 DIAGNOSIS — N189 Chronic kidney disease, unspecified: Secondary | ICD-10-CM

## 2020-06-25 ENCOUNTER — Inpatient Hospital Stay: Payer: Medicare Other

## 2020-06-25 ENCOUNTER — Inpatient Hospital Stay: Payer: Medicare Other | Attending: Oncology

## 2020-06-25 ENCOUNTER — Other Ambulatory Visit: Payer: Self-pay

## 2020-06-25 ENCOUNTER — Telehealth: Payer: Self-pay | Admitting: Oncology

## 2020-06-25 ENCOUNTER — Inpatient Hospital Stay (HOSPITAL_BASED_OUTPATIENT_CLINIC_OR_DEPARTMENT_OTHER): Payer: Medicare Other | Admitting: Oncology

## 2020-06-25 VITALS — BP 133/73 | HR 99 | Temp 98.5°F

## 2020-06-25 VITALS — BP 163/82 | HR 102 | Temp 97.1°F | Resp 20 | Ht <= 58 in | Wt 184.6 lb

## 2020-06-25 DIAGNOSIS — D631 Anemia in chronic kidney disease: Secondary | ICD-10-CM

## 2020-06-25 DIAGNOSIS — M8949 Other hypertrophic osteoarthropathy, multiple sites: Secondary | ICD-10-CM | POA: Insufficient documentation

## 2020-06-25 DIAGNOSIS — N189 Chronic kidney disease, unspecified: Secondary | ICD-10-CM | POA: Diagnosis not present

## 2020-06-25 DIAGNOSIS — Z79899 Other long term (current) drug therapy: Secondary | ICD-10-CM | POA: Insufficient documentation

## 2020-06-25 DIAGNOSIS — I4891 Unspecified atrial fibrillation: Secondary | ICD-10-CM | POA: Diagnosis not present

## 2020-06-25 DIAGNOSIS — M3500 Sicca syndrome, unspecified: Secondary | ICD-10-CM | POA: Insufficient documentation

## 2020-06-25 DIAGNOSIS — E1121 Type 2 diabetes mellitus with diabetic nephropathy: Secondary | ICD-10-CM | POA: Insufficient documentation

## 2020-06-25 DIAGNOSIS — N183 Chronic kidney disease, stage 3 unspecified: Secondary | ICD-10-CM | POA: Diagnosis not present

## 2020-06-25 LAB — CBC WITH DIFFERENTIAL (CANCER CENTER ONLY)
Abs Immature Granulocytes: 0.04 10*3/uL (ref 0.00–0.07)
Basophils Absolute: 0 10*3/uL (ref 0.0–0.1)
Basophils Relative: 0 %
Eosinophils Absolute: 0 10*3/uL (ref 0.0–0.5)
Eosinophils Relative: 0 %
HCT: 28.8 % — ABNORMAL LOW (ref 36.0–46.0)
Hemoglobin: 8.9 g/dL — ABNORMAL LOW (ref 12.0–15.0)
Immature Granulocytes: 1 %
Lymphocytes Relative: 6 %
Lymphs Abs: 0.5 10*3/uL — ABNORMAL LOW (ref 0.7–4.0)
MCH: 29.3 pg (ref 26.0–34.0)
MCHC: 30.9 g/dL (ref 30.0–36.0)
MCV: 94.7 fL (ref 80.0–100.0)
Monocytes Absolute: 0.5 10*3/uL (ref 0.1–1.0)
Monocytes Relative: 7 %
Neutro Abs: 6.4 10*3/uL (ref 1.7–7.7)
Neutrophils Relative %: 86 %
Platelet Count: 235 10*3/uL (ref 150–400)
RBC: 3.04 MIL/uL — ABNORMAL LOW (ref 3.87–5.11)
RDW: 15.9 % — ABNORMAL HIGH (ref 11.5–15.5)
WBC Count: 7.4 10*3/uL (ref 4.0–10.5)
nRBC: 0 % (ref 0.0–0.2)

## 2020-06-25 MED ORDER — FERUMOXYTOL INJECTION 510 MG/17 ML
510.0000 mg | Freq: Once | INTRAVENOUS | Status: AC
Start: 2020-06-25 — End: 2020-06-25
  Administered 2020-06-25: 510 mg via INTRAVENOUS
  Filled 2020-06-25: qty 510

## 2020-06-25 MED ORDER — ALBUTEROL SULFATE (2.5 MG/3ML) 0.083% IN NEBU
2.5000 mg | INHALATION_SOLUTION | Freq: Once | RESPIRATORY_TRACT | Status: DC | PRN
  Filled 2020-06-25: qty 3

## 2020-06-25 MED ORDER — EPOETIN ALFA-EPBX 40000 UNIT/ML IJ SOLN: 40000.0000 [IU] | Freq: Once | INTRAMUSCULAR | Status: DC

## 2020-06-25 MED ORDER — DIPHENHYDRAMINE HCL 50 MG/ML IJ SOLN: 50.0000 mg | Freq: Once | INTRAMUSCULAR | Status: DC | PRN

## 2020-06-25 MED ORDER — SODIUM CHLORIDE 0.9 % IV SOLN
Freq: Once | INTRAVENOUS | Status: DC | PRN
  Filled 2020-06-25: qty 250

## 2020-06-25 MED ORDER — SODIUM CHLORIDE 0.9 % IV SOLN
Freq: Once | INTRAVENOUS | Status: AC
  Filled 2020-06-25: qty 250

## 2020-06-25 MED ORDER — METHYLPREDNISOLONE SODIUM SUCC 125 MG IJ SOLR: 125.0000 mg | Freq: Once | INTRAMUSCULAR | Status: DC | PRN

## 2020-06-25 MED ORDER — EPINEPHRINE 0.3 MG/0.3ML IJ SOAJ: 0.3000 mg | Freq: Once | INTRAMUSCULAR | Status: DC | PRN

## 2020-06-25 MED ORDER — EPOETIN ALFA-EPBX 10000 UNIT/ML IJ SOLN: 20000.0000 [IU] | Freq: Once | INTRAMUSCULAR | Status: DC

## 2020-06-25 MED ORDER — FAMOTIDINE IN NACL 20-0.9 MG/50ML-% IV SOLN: 20.0000 mg | Freq: Once | INTRAVENOUS | Status: DC | PRN

## 2020-06-25 NOTE — Patient Instructions (Signed)

## 2020-06-25 NOTE — Telephone Encounter (Signed)
Left message regarding scheduled injection appointment per 1/4 schedule message. Gave option to call back to reschedule if needed.

## 2020-06-25 NOTE — Progress Notes (Signed)
[facility] OFFICE PROGRESS NOTE   Diagnosis: Anemia, chronic renal failure,GAVE  INTERVAL HISTORY:   [name] returns as scheduled.  She reports malaise.  No GI bleeding.  She has intermittent episodes of chest pain, relieved with burping at times.  The pain is not associated with exertion.  Objective:  Vital signs in last 24 hours:  Blood pressure (!) 163/82, pulse (!) 102, temperature (!) 97.1 F (36.2 C), temperature source Tympanic, resp. rate 20, height _0  (1.448 m), weight 184 lb 9.6 oz (83.7 kg), SpO2 96 %.    Resp: Lungs clear bilaterally Cardio: Irregular GI: No hepatosplenomegaly Vascular: Trace edema at the right lower leg    Lab Results:  Lab Results  Component Value Date   WBC 7.4 06/25/2020   HGB 8.9 (L) 06/25/2020   HCT 28.8 (L) 06/25/2020   MCV 94.7 06/25/2020   PLT 235 06/25/2020   NEUTROABS 6.4 06/25/2020    CMP  Lab Results  Component Value Date   NA 138 02/21/2019   K 4.4 02/21/2019   CL 98 02/21/2019   CO2 30 02/21/2019   GLUCOSE 144 (H) 02/21/2019   BUN 61 (H) 02/21/2019   CREATININE 2.11 (H) 02/21/2019   CALCIUM 10.0 02/21/2019   PROT 6.1 (L) 07/05/2018   ALBUMIN 3.3 (L) 07/05/2018   AST 12 (L) 07/05/2018   ALT 8 07/05/2018   ALKPHOS 59 07/05/2018   BILITOT 0.3 07/05/2018   GFRNONAA 22 (L) 02/21/2019   GFRAA 26 (L) 02/21/2019     Medications: I have reviewed the patient's current medications.   Assessment/Plan: 1. 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; quantitative  immunoglobulins show IgG mildly decreased 654 (range [id number]), IgA and IgM in normal range; polyclonal serum light chain elevation  11/15/2015-hemoglobin 11.2  11/29/2015-hemoglobin 11.1  12/12/2005-hemoglobin 10.4; Aranesp 100 g every 2 weeks initiated  02/07/2016-hemoglobin 11.0, Aranesp held, changed to an every 3 week schedule  Progressive anemia and decreased iron stores December 2017, treated with IV iron January 2018 with improvement  She did not require Aranesp for 2 months and resumed Aranesp on a monthly schedule for 08/2016  Aranesp every 6 weeks beginning 05/04/2017  Progressive anemia and decreased iron stores 07/05/2017; IV irongiven2/11/2017 and 08/06/2017  Persistent severe anemia, Aranesp change to every 2 weeks beginning 10/19/2017  Persistent anemia, decreased iron stores 11/30/2017  IV iron 12/13/2017, 12/20/2017,  Decreased iron stores 05/17/2018  IV iron1/08/2018  07/05/2018-no serum M spike, polyclonal serum light chain elevation, IgG mildly decreased with IgA and IgM within normal range  2 units of blood 08/17/2018  IV iron 08/17/2018 and 08/24/2018  Stool heme +08/30/2018, referred to GI  2 units of blood 09/01/2018  1 unit of blood 10/25/2018  11/11/2018 colonoscopy-diverticulosis in the entire examined colon. End-to-end colocolonic anastomosis. One 5 mm polyp in the ascending colon. No cause for anemia.  11/11/2018 upper endoscopy-evidence of gastric antral vascular ectasia which is the likely cause of the patient's worsening anemia.  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  Hemoglobin stabilized following treatment of theGave  Every 6-week erythropoietin 06/22/2019, erythropoietin dose increased to 60,000 units  09/14/2019  Erythropoietin 60,000 units every 3 weeks 10/24/2019  Schedule changed to every 4 weeks 12/15/2019  IV iron 02/21/2020 and 02/29/2020, 06/25/2020,  07/03/2020  2. Chronic renal insufficiency  3. Atrial fibrillation-maintained on apixaban  4. Osteoarthritis  5. Diabetes  6. Sjogren's disease  7. Pericarditis February 2017  8.Colonoscopy 01/07/2012-prior segmental colectomy in the sigmoid colon; moderate diverticulosis throughout the colon; otherwise normal examination   Disposition: Tricia Gonzales appears unchanged.  She has persistent anemia.  The ferritin was below goal range last month.  She will receive IV iron today and again next week.  She will continue every 2-week erythropoietin therapy.  Ms. Doner will return for an office visit in approximately 3 months.  Orders were entered for IV iron today.  [name], MD  06/25/2020  12:04 PM

## 2020-06-26 ENCOUNTER — Telehealth: Payer: Self-pay | Admitting: Oncology

## 2020-06-26 NOTE — Telephone Encounter (Signed)
Scheduled appointments per 1/4 los. Spoke to patient who is aware of appointments dates and times.

## 2020-06-27 ENCOUNTER — Other Ambulatory Visit: Payer: Medicare Other

## 2020-06-27 ENCOUNTER — Ambulatory Visit: Payer: Medicare Other | Admitting: Oncology

## 2020-06-27 ENCOUNTER — Ambulatory Visit: Payer: Medicare Other

## 2020-07-02 ENCOUNTER — Other Ambulatory Visit: Payer: Self-pay

## 2020-07-02 ENCOUNTER — Inpatient Hospital Stay: Payer: Medicare Other

## 2020-07-02 ENCOUNTER — Ambulatory Visit: Payer: Medicare Other

## 2020-07-02 VITALS — BP 131/87 | HR 100 | Temp 98.6°F | Resp 20

## 2020-07-02 DIAGNOSIS — N183 Chronic kidney disease, stage 3 unspecified: Secondary | ICD-10-CM

## 2020-07-02 DIAGNOSIS — N189 Chronic kidney disease, unspecified: Secondary | ICD-10-CM

## 2020-07-02 DIAGNOSIS — D631 Anemia in chronic kidney disease: Secondary | ICD-10-CM

## 2020-07-02 MED ORDER — EPOETIN ALFA-EPBX 40000 UNIT/ML IJ SOLN
INTRAMUSCULAR | Status: AC
  Filled 2020-07-02: qty 1

## 2020-07-02 MED ORDER — EPOETIN ALFA-EPBX 40000 UNIT/ML IJ SOLN
40000.0000 [IU] | Freq: Once | INTRAMUSCULAR | Status: AC
  Administered 2020-07-02: 40000 [IU] via SUBCUTANEOUS

## 2020-07-02 MED ORDER — EPOETIN ALFA-EPBX 10000 UNIT/ML IJ SOLN
INTRAMUSCULAR | Status: AC
  Filled 2020-07-02: qty 2

## 2020-07-02 MED ORDER — EPOETIN ALFA-EPBX 10000 UNIT/ML IJ SOLN
20000.0000 [IU] | Freq: Once | INTRAMUSCULAR | Status: AC
  Administered 2020-07-02: 20000 [IU] via SUBCUTANEOUS

## 2020-07-02 NOTE — Patient Instructions (Signed)

## 2020-07-02 NOTE — Progress Notes (Signed)
Per Dr. Benay Spice: okay to give  RETACRIT injection based on CBC from 06/25/2020.

## 2020-07-03 ENCOUNTER — Inpatient Hospital Stay: Payer: Medicare Other

## 2020-07-03 ENCOUNTER — Other Ambulatory Visit: Payer: Self-pay

## 2020-07-03 VITALS — BP 124/62 | HR 100 | Temp 97.8°F | Resp 20

## 2020-07-03 DIAGNOSIS — N183 Chronic kidney disease, stage 3 unspecified: Secondary | ICD-10-CM | POA: Diagnosis not present

## 2020-07-03 DIAGNOSIS — N189 Chronic kidney disease, unspecified: Secondary | ICD-10-CM

## 2020-07-03 DIAGNOSIS — D631 Anemia in chronic kidney disease: Secondary | ICD-10-CM

## 2020-07-03 MED ORDER — SODIUM CHLORIDE 0.9 % IV SOLN
Freq: Once | INTRAVENOUS | Status: AC
  Filled 2020-07-03: qty 250

## 2020-07-03 MED ORDER — SODIUM CHLORIDE 0.9 % IV SOLN
510.0000 mg | Freq: Once | INTRAVENOUS | Status: AC
  Administered 2020-07-03: 510 mg via INTRAVENOUS
  Filled 2020-07-03: qty 510

## 2020-07-03 NOTE — Patient Instructions (Signed)

## 2020-07-03 NOTE — Progress Notes (Signed)
Patient declined to stay for 30 minute post infusion observation period. VSS upon leaving infusion room.

## 2020-07-04 ENCOUNTER — Other Ambulatory Visit: Payer: Self-pay | Admitting: *Deleted

## 2020-07-04 DIAGNOSIS — D631 Anemia in chronic kidney disease: Secondary | ICD-10-CM

## 2020-07-04 DIAGNOSIS — N189 Chronic kidney disease, unspecified: Secondary | ICD-10-CM

## 2020-07-04 NOTE — Progress Notes (Signed)
Per Dr. Benay Spice: Retacrit will be every 2 weeks until Hgb improves and can then reduce to every 3 weeks. Orders placed and scheduling message sent. Lab w/each injection

## 2020-07-05 ENCOUNTER — Ambulatory Visit (INDEPENDENT_AMBULATORY_CARE_PROVIDER_SITE_OTHER): Payer: Medicare Other

## 2020-07-05 DIAGNOSIS — I442 Atrioventricular block, complete: Secondary | ICD-10-CM

## 2020-07-08 ENCOUNTER — Telehealth: Payer: Self-pay

## 2020-07-08 LAB — CUP PACEART REMOTE DEVICE CHECK
Battery Remaining Longevity: 49 mo
Battery Voltage: 2.99 V
Brady Statistic AP VP Percent: 1.66 %
Brady Statistic AP VS Percent: 0.23 %
Brady Statistic AS VP Percent: 4.77 %
Brady Statistic AS VS Percent: 93.34 %
Brady Statistic RA Percent Paced: 0.95 %
Brady Statistic RV Percent Paced: 3.04 %
Date Time Interrogation Session: 20220114151959
Implantable Lead Implant Date: 20161227
Implantable Lead Implant Date: 20161227
Implantable Lead Location: 753859
Implantable Lead Location: 753860
Implantable Lead Model: 5076
Implantable Lead Model: 5076
Implantable Pulse Generator Implant Date: 20161227
Lead Channel Impedance Value: 304 Ohm
Lead Channel Impedance Value: 304 Ohm
Lead Channel Impedance Value: 361 Ohm
Lead Channel Impedance Value: 380 Ohm
Lead Channel Pacing Threshold Amplitude: 0.625 V
Lead Channel Pacing Threshold Amplitude: 1.25 V
Lead Channel Pacing Threshold Pulse Width: 0.4 ms
Lead Channel Pacing Threshold Pulse Width: 0.4 ms
Lead Channel Sensing Intrinsic Amplitude: 0.625 mV
Lead Channel Sensing Intrinsic Amplitude: 0.625 mV
Lead Channel Sensing Intrinsic Amplitude: 15 mV
Lead Channel Sensing Intrinsic Amplitude: 15 mV
Lead Channel Setting Pacing Amplitude: 2 V
Lead Channel Setting Pacing Amplitude: 2.5 V
Lead Channel Setting Pacing Pulse Width: 0.4 ms
Lead Channel Setting Sensing Sensitivity: 2.8 mV

## 2020-07-08 NOTE — Telephone Encounter (Signed)
[facility] alert-  AF burden 98%; EGMs suggest A. Flutter - ongoing; V rate histogram shows ~60% binned >100 bpm. History of AF and on apixaban and carvedilol. Routing to triage for ongoing. 97 VHR events; longest 9 minutes w/ rates 150's to 200's bpm; EGMs suggest RVR and SVT  Patient has remote history of PAF, current meds include Carvedilol 3.187m BID, Eliquis 574mBID.  Spoke with pt, she was not aware of being in AF, upon discussion it appears pt has had increased SOB lately especially with any exertion.  She repots if she tries to do anything she feels like she is dying.  She had gone to her PCP who referred her to Dr. O'Audie Box She has an appt with him  later this month.   Discussed option of AF clinic.  Pt would like to do anything she can to feel better.

## 2020-07-09 ENCOUNTER — Other Ambulatory Visit: Payer: Medicare Other

## 2020-07-09 ENCOUNTER — Ambulatory Visit: Payer: Medicare Other

## 2020-07-09 NOTE — Telephone Encounter (Signed)
Called and left message for patient to call back to schedule appt.

## 2020-07-09 NOTE — Telephone Encounter (Signed)
Patient returned my call and is aware of appt 07/11/2020 at 10 am.

## 2020-07-11 ENCOUNTER — Other Ambulatory Visit: Payer: Self-pay

## 2020-07-11 ENCOUNTER — Ambulatory Visit (HOSPITAL_COMMUNITY)
Admission: RE | Admit: 2020-07-11 | Discharge: 2020-07-11 | Disposition: A | Discharge status: Home / Self Care | Payer: Medicare Other | Source: Ambulatory Visit | Attending: Nurse Practitioner | Admitting: Nurse Practitioner

## 2020-07-11 ENCOUNTER — Encounter (HOSPITAL_COMMUNITY): Payer: Self-pay | Admitting: Nurse Practitioner

## 2020-07-11 VITALS — BP 111/78 | Wt 183.8 lb

## 2020-07-11 DIAGNOSIS — Z9049 Acquired absence of other specified parts of digestive tract: Secondary | ICD-10-CM | POA: Diagnosis not present

## 2020-07-11 DIAGNOSIS — Z8249 Family history of ischemic heart disease and other diseases of the circulatory system: Secondary | ICD-10-CM | POA: Diagnosis not present

## 2020-07-11 DIAGNOSIS — Z87891 Personal history of nicotine dependence: Secondary | ICD-10-CM | POA: Insufficient documentation

## 2020-07-11 DIAGNOSIS — Z885 Allergy status to narcotic agent status: Secondary | ICD-10-CM | POA: Insufficient documentation

## 2020-07-11 DIAGNOSIS — Z79899 Other long term (current) drug therapy: Secondary | ICD-10-CM | POA: Insufficient documentation

## 2020-07-11 DIAGNOSIS — Z950 Presence of cardiac pacemaker: Secondary | ICD-10-CM | POA: Insufficient documentation

## 2020-07-11 DIAGNOSIS — Z7901 Long term (current) use of anticoagulants: Secondary | ICD-10-CM | POA: Insufficient documentation

## 2020-07-11 DIAGNOSIS — Z96653 Presence of artificial knee joint, bilateral: Secondary | ICD-10-CM | POA: Diagnosis not present

## 2020-07-11 DIAGNOSIS — Z79890 Hormone replacement therapy (postmenopausal): Secondary | ICD-10-CM | POA: Insufficient documentation

## 2020-07-11 DIAGNOSIS — I4819 Other persistent atrial fibrillation: Secondary | ICD-10-CM | POA: Diagnosis present

## 2020-07-11 DIAGNOSIS — D6869 Other thrombophilia: Secondary | ICD-10-CM

## 2020-07-11 DIAGNOSIS — Z888 Allergy status to other drugs, medicaments and biological substances status: Secondary | ICD-10-CM | POA: Diagnosis not present

## 2020-07-11 MED ORDER — CARVEDILOL 3.125 MG PO TABS: ORAL_TABLET | ORAL | 1 refills | Status: DC

## 2020-07-11 NOTE — Progress Notes (Signed)
Primary Care Physician: Carol Ada, MD Referring Physician: Dr. Lovena Le  Cardiologist: Dr. Liane Comber is a 80 y.o. female with a h/o PPM for junctional bradycardia, HTN, anemia in chronic kidney disease, DM, paroxysmal afib on eliquis 5 mg bid for a CHA2DS2VASc score of at least 5, that is in the afib clinic for persistent afib noted on her paceart report since November with average v rates around 100 bpm. She has not felt her heart racing but has noted significant fatigue and shortness of breath with exertion. EKG shows afib with RBBB at 114 bpm today. She did have a delayed dose of eliquis for more than 6 hours last week, so have to consider this a missed dose. She can not remember the exact day but feels from Friday  the 14th  she has taken correctly. She  does have an appointment to establish with Dr. Audie Box on 1/28.   Today, she denies symptoms of palpitations, chest pain, shortness of breath, orthopnea, PND, lower extremity edema, dizziness, presyncope, syncope, or neurologic sequela. The patient is tolerating medications without difficulties and is otherwise without complaint today.   Past Medical History:  Diagnosis Date  . Anemia   . 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 Anxiety   . Arthritis   . Atrial fibrillation ([place])   . Avascular necrosis (HCC) hip left   leg pain also  . Chronic back pain   . Chronic kidney disease (CKD), stage III (moderate) (HCC)   . Depression   . Diverticulosis   . Early cataracts, bilateral   . Esophageal stricture   . Gastritis   . GERD (gastroesophageal reflux disease)   . Glaucoma   . Heart murmur     " some doctors say that I have one some say that I don"t "  . Hiatal hernia   . History of blood transfusion    "@ least w/1st knee OR"  . History of gout   . History of kidney stones   . Hyperlipidemia   . Hypertension   . Intestinal obstruction ([place])   . Kidney stones   . Obesity   . Osteoarthritis   .  Osteoporosis   . Pericarditis 2016  . Presence of permanent cardiac pacemaker   . Sjogren's disease ([place])   . Thyroid disease   . Type II diabetes mellitus ([place])   . Vitamin B12 deficiency    Past Surgical History:  Procedure Laterality Date  . ABDOMINAL HYSTERECTOMY     complete  . CHOLECYSTECTOMY OPEN    . COLECTOMY     for rectovaginal fistula  . COLONOSCOPY  01/07/2012   Procedure: COLONOSCOPY;  Surgeon: Lafayette Dragon, MD;  Location: [facility];  Service: Endoscopy;  Laterality: N/A;  . EP IMPLANTABLE DEVICE N/A 06/18/2015   Procedure: Pacemaker Implant;  Surgeon: Evans Lance, MD;  Location: [facility];  Service: Cardiovascular;  Laterality: N/A;  . ESOPHAGOGASTRODUODENOSCOPY N/A 11/24/2018   Procedure: ESOPHAGOGASTRODUODENOSCOPY (EGD);  Surgeon: Yetta Flock, MD;  Location: [facility];  Service: Gastroenterology;  Laterality: N/A;  . ESOPHAGOGASTRODUODENOSCOPY (EGD) WITH ESOPHAGEAL DILATION    . ESOPHAGOGASTRODUODENOSCOPY (EGD) WITH PROPOFOL N/A 08/12/2015   Procedure: ESOPHAGOGASTRODUODENOSCOPY (EGD) WITH PROPOFOL;  Surgeon: Manus Gunning, MD;  Location: [place];  Service: Gastroenterology;  Laterality: N/A;  . EYE SURGERY Bilateral    cataracts  . HOT HEMOSTASIS N/A 11/24/2018   Procedure: HOT HEMOSTASIS (ARGON PLASMA COAGULATION/BICAP);  Surgeon: Yetta Flock,  MD;  Location: [facility];  Service: Gastroenterology;  Laterality: N/A;  . I & D EXTREMITY Right 08/06/2016   Procedure: DEBRIDEMENT PIP RIGHT RING FINGER;  Surgeon: Daryll Brod, MD;  Location: [place];  Service: Orthopedics;  Laterality: Right;  . INSERT / REPLACE / REMOVE PACEMAKER    . JOINT REPLACEMENT    . KNEE ARTHROSCOPY Right   . MASS EXCISION Right 08/06/2016   Procedure: EXCISION CYST;  Surgeon: Daryll Brod, MD;  Location: [facility];  Service: Orthopedics;  Laterality: Right;  . REVISION TOTAL KNEE ARTHROPLASTY Left   . TOTAL KNEE ARTHROPLASTY Bilateral     Current  Outpatient Medications  Medication Sig Dispense Refill  . ACCU-CHEK AVIVA PLUS test strip 1 each by Other route daily.     Marland Kitchen acetaminophen (TYLENOL) 650 MG CR tablet Take 1,300 mg by mouth at bedtime as needed for pain.    . BD PEN NEEDLE NANO U/F 32G X 4 MM MISC See admin instructions.    . Cholecalciferol (VITAMIN D3) 25 MCG (1000 UT) CAPS Take 1,000 Units by mouth daily.    . dorzolamide (TRUSOPT) 2 % ophthalmic solution Place 1 drop into the right eye 2 (two) times daily. 10 mL 0  . ELIQUIS 5 MG TABS tablet TAKE ONE TABLET TWICE DAILY 180 tablet 1  . febuxostat (ULORIC) 40 MG tablet Take 1 tablet (40 mg total) by mouth every Monday, Wednesday, and Friday. 12 tablet 0  . ferrous sulfate 325 (65 FE) MG EC tablet Take 1 tablet (325 mg total) by mouth 2 (two) times daily. (Patient taking differently: Take 325 mg by mouth 3 (three) times daily.) 60 tablet 3  . furosemide (LASIX) 40 MG tablet Take 1 tablet (40 mg total) by mouth daily. 30 tablet 0  . gabapentin (NEURONTIN) 300 MG capsule Take 1 capsule (300 mg total) by mouth daily. 30 capsule 0  . gabapentin (NEURONTIN) 400 MG capsule Take 1 capsule (400 mg total) by mouth at bedtime. 30 capsule 0  . JANUVIA 50 MG tablet Take 1 tablet (50 mg total) by mouth daily. 30 tablet 0  . levothyroxine (SYNTHROID) 50 MCG tablet Take 1 tablet (50 mcg total) by mouth at bedtime. 30 tablet 0  . montelukast (SINGULAIR) 10 MG tablet Take 1 tablet (10 mg total) by mouth at bedtime. 30 tablet 0  . nystatin (NYAMYC) powder APPLY TOPICALLY TO LEFT ABDOMINAL FOLD AND GROIN TWICE A DAY 15 g 0  . pantoprazole (PROTONIX) 40 MG tablet Take 1 tablet (40 mg total) by mouth daily. 30 tablet 0  . pentoxifylline (TRENTAL) 400 MG CR tablet Take 1 tablet (400 mg total) by mouth daily. 30 tablet 0  . Polyethylene Glycol 3350 (MIRALAX PO) Take 17 g by mouth daily.    Marland Kitchen rOPINIRole (REQUIP) 0.5 MG tablet SMARTSIG:1 Tablet(s) By Mouth Every Evening    . rosuvastatin (CRESTOR) 5 MG  tablet Take 1 tablet (5 mg total) by mouth daily. 30 tablet 0  . senna-docusate (SENOKOT-S) 8.6-50 MG tablet Take 1 tablet by mouth at bedtime.    . traMADol (ULTRAM) 50 MG tablet Take 50 mg by mouth 2 (two) times daily as needed.    . traZODone (DESYREL) 50 MG tablet Take 0.5 tablets (25 mg total) by mouth at bedtime. 0.5 tablet to = 25 mg 15 tablet 0  . TRESIBA FLEXTOUCH 100 UNIT/ML FlexTouch Pen SMARTSIG:4-8 Unit(s) SUB-Q Daily    . carvedilol (COREG) 3.125 MG tablet Take 2 tablets in the AM and  1 tablet in the PM 75 tablet 1   No current facility-administered medications for this encounter.    Allergies  Allergen Reactions  . Norvasc [Amlodipine Besylate] Shortness Of Breath  . Avapro [Irbesartan]     Headaches  . Glucophage [Metformin Hydrochloride] Other (See Comments)    "increases creatinine"  . Lisinopril     Headache   . Losartan     Headache   . Morphine And Related Other (See Comments)    "hallucinations"  . Simvastatin Other (See Comments)    Body aches  . Trulicity [Dulaglutide] Other (See Comments)    Severe mood swings  . Fiber Rash    Pt voiced she is allergic to micro fiber materials/ blankets    Social History   Socioeconomic History  . Marital status: Divorced    Spouse name: Not on file  . Number of children: 2  . Years of education: Not on file  . Highest education level: Not on file  Occupational History  . Not on file  Tobacco Use  . Smoking status: Former Smoker    Packs/day: 2.00    Years: 3.00    Pack years: 6.00    Types: Cigarettes    Quit date: 06/23/1983    Years since quitting: 37.0  . Smokeless tobacco: Never Used  Vaping Use  . Vaping Use: Never used  Substance and Sexual Activity  . Alcohol use: Yes    Alcohol/week: 2.0 standard drinks    Types: 2 Glasses of wine per week    Comment: occ  . Drug use: No  . Sexual activity: Never  Other Topics Concern  . Not on file  Social History Narrative  . Not on file   Social  Determinants of Health   Financial Resource Strain: Not on file  Food Insecurity: Not on file  Transportation Needs: Not on file  Physical Activity: Not on file  Stress: Not on file  Social Connections: Not on file  Intimate Partner Violence: Not on file    Family History  Problem Relation Age of Onset  . Diabetes Mother   . Diabetes Brother   . Hypertension Sister   . Colon cancer Neg Hx   . Heart attack Neg Hx   . Stroke Neg Hx   . Colon polyps Neg Hx   . Esophageal cancer Neg Hx   . Rectal cancer Neg Hx   . Stomach cancer Neg Hx     ROS- All systems are reviewed and negative except as per the HPI above  Physical Exam: Vitals:   07/11/20 0957  BP: 111/78  Weight: 83.4 kg   Wt Readings from Last 3 Encounters:  07/11/20 83.4 kg  06/25/20 83.7 kg  04/10/20 79.4 kg    Labs: Lab Results  Component Value Date   NA 138 02/21/2019   K 4.4 02/21/2019   CL 98 02/21/2019   CO2 30 02/21/2019   GLUCOSE 144 (H) 02/21/2019   BUN 61 (H) 02/21/2019   CREATININE 2.11 (H) 02/21/2019   CALCIUM 10.0 02/21/2019   PHOS 4.2 09/15/2011   MG 2.3 09/15/2011   Lab Results  Component Value Date   INR 1.05 09/15/2011   No results found for: CHOL, HDL, LDLCALC, TRIG   GEN- The patient is well appearing, alert and oriented x 3 today.   Head- normocephalic, atraumatic Eyes-  Sclera clear, conjunctiva pink Ears- hearing intact Oropharynx- clear Neck- supple, no JVP Lymph- no cervical lymphadenopathy Lungs- Clear to ausculation bilaterally,  normal work of breathing Heart- irregular rate and rhythm, no murmurs, rubs or gallops, PMI not laterally displaced GI- soft, NT, ND, + BS Extremities- no clubbing, cyanosis, or edema MS- no significant deformity or atrophy Skin- no rash or lesion Psych- euthymic mood, full affect Neuro- strength and sensation are intact  EKG-afib at 114 bpm, qrs int 152 ms, qtc 490 ms   Paceart  reviewed   Assessment and Plan: 1. Persistent   afib General edcuation re afib and triggers discussed  Has been persistent since November  She missed anticoagulation dose so will need to restart 21 day count for DCCV She does not want a TEE guided cardioversion  Not optimally rate controlled and to make her feel more comfortable until DCCV, will increase coreg to 3.125 ( 2 ) tabs in am and leave at 1 tab in the pm    2. CHA2DS2VASc score of 5 Continue eliquis 5 mg bid  We will use 1/14 going forward and the 21 days of uninterrupted anticoagulation will be after  2/4 that she will be eligible for CV  F/u with Dr. Audie Box 1/28 and at which time she can be scheduled for cardioversion   Butch Penny C. Davone Shinault, [facility] [address] [place], [place] [id number] [phone]

## 2020-07-11 NOTE — Patient Instructions (Signed)
Increase coreg to 2 tablets in the morning and 1 tablet in the evening.

## 2020-07-16 ENCOUNTER — Ambulatory Visit: Payer: Medicare Other

## 2020-07-16 ENCOUNTER — Inpatient Hospital Stay: Payer: Medicare Other

## 2020-07-16 ENCOUNTER — Other Ambulatory Visit: Payer: Self-pay

## 2020-07-16 VITALS — BP 139/76 | HR 100 | Temp 97.9°F | Resp 16

## 2020-07-16 DIAGNOSIS — N189 Chronic kidney disease, unspecified: Secondary | ICD-10-CM

## 2020-07-16 DIAGNOSIS — N183 Chronic kidney disease, stage 3 unspecified: Secondary | ICD-10-CM | POA: Diagnosis not present

## 2020-07-16 DIAGNOSIS — D631 Anemia in chronic kidney disease: Secondary | ICD-10-CM

## 2020-07-16 LAB — CBC WITH DIFFERENTIAL (CANCER CENTER ONLY)
Abs Immature Granulocytes: 0.02 10*3/uL (ref 0.00–0.07)
Basophils Absolute: 0 10*3/uL (ref 0.0–0.1)
Basophils Relative: 0 %
Eosinophils Absolute: 0 10*3/uL (ref 0.0–0.5)
Eosinophils Relative: 0 %
HCT: 30.9 % — ABNORMAL LOW (ref 36.0–46.0)
Hemoglobin: 9.2 g/dL — ABNORMAL LOW (ref 12.0–15.0)
Immature Granulocytes: 0 %
Lymphocytes Relative: 6 %
Lymphs Abs: 0.4 10*3/uL — ABNORMAL LOW (ref 0.7–4.0)
MCH: 30.3 pg (ref 26.0–34.0)
MCHC: 29.8 g/dL — ABNORMAL LOW (ref 30.0–36.0)
MCV: 101.6 fL — ABNORMAL HIGH (ref 80.0–100.0)
Monocytes Absolute: 0.5 10*3/uL (ref 0.1–1.0)
Monocytes Relative: 8 %
Neutro Abs: 4.8 10*3/uL (ref 1.7–7.7)
Neutrophils Relative %: 86 %
Platelet Count: 220 10*3/uL (ref 150–400)
RBC: 3.04 MIL/uL — ABNORMAL LOW (ref 3.87–5.11)
RDW: 18.9 % — ABNORMAL HIGH (ref 11.5–15.5)
WBC Count: 5.6 10*3/uL (ref 4.0–10.5)
nRBC: 0 % (ref 0.0–0.2)

## 2020-07-16 MED ORDER — EPOETIN ALFA-EPBX 40000 UNIT/ML IJ SOLN
40000.0000 [IU] | Freq: Once | INTRAMUSCULAR | Status: AC
  Administered 2020-07-16: 40000 [IU] via SUBCUTANEOUS

## 2020-07-16 MED ORDER — EPOETIN ALFA-EPBX 10000 UNIT/ML IJ SOLN
INTRAMUSCULAR | Status: AC
  Filled 2020-07-16: qty 2

## 2020-07-16 MED ORDER — EPOETIN ALFA-EPBX 10000 UNIT/ML IJ SOLN
20000.0000 [IU] | Freq: Once | INTRAMUSCULAR | Status: AC
  Administered 2020-07-16: 20000 [IU] via SUBCUTANEOUS

## 2020-07-16 MED ORDER — EPOETIN ALFA-EPBX 40000 UNIT/ML IJ SOLN
INTRAMUSCULAR | Status: AC
  Filled 2020-07-16: qty 1

## 2020-07-18 NOTE — Progress Notes (Signed)
Cardiology Office Note:   Date:  07/19/2020  NAME:  Tricia Gonzales    MRN: [id number] DOB:  1940/09/10   PCP:  Carol Ada, MD  Cardiologist:  No primary care provider on file.  Electrophysiologist:  Cristopher Peru, MD   Referring MD: Carol Ada, MD   Chief Complaint  Patient presents with  . Shortness of Breath   History of Present Illness:   Tricia Gonzales is a 80 y.o. female with a hx of diabetes, hypertension, CKD, paroxysmal atrial fibrillation who is being seen today for the evaluation of shortness of breath at the request of Carol Ada, MD.  Followed by EP for pacemaker implantation due to junctional bradycardia.  Has been in atrial fibrillation with rapid response.  Evaluated by the A. fib clinic and intervention deferred to [place]. She reports she has been short of breath for nearly 1 month.  This is coincided with her going back in atrial fibrillation.  There was a missed dose of Eliquis and then cardioversion was deferred until she saw me.  She reports anytime she exerts herself she can get short of breath.  She also reports intermittent chest tightness which mainly occurs with laying down.  She does not feel her heart racing.  She does endorse lower extremity edema.  Medical history significant for diabetes, CKD, hypertension.  She also has sick sinus syndrome status post pacemaker implantation.  She has been paced 50 to 70% of the time most interrogations that I been able to review.  Most recent creatinine 1.96.  No recent chest x-ray.  No recent echocardiogram.  She needs a full evaluation of her shortness of breath.  She clearly has congestive heart failure and I wonder how much A. fib is playing into this versus congestive heart failure causing A. fib.  She reports no missed doses of Eliquis in the last 2 weeks.  We can set her up for cardioversion in the next 1 to 2 weeks.  She also needs to increase her Lasix and to have a repeat evaluation.  Problem List 1. Paroxysmal  Afib -CHADSVASC=5 2. Junctional bradycardia s/p ppm 2016 3. DM -A1c 6.5 4. HTN 5. CKD -1.96 6. HLD -T chol 151, HDL 44, LDL 84, TG 131 7. Anemia -Hgb 9.2  Past Medical History: Past Medical History:  Diagnosis Date  . Anemia   . 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 Anxiety   . Arthritis   . Atrial fibrillation ([place])   . Avascular necrosis (HCC) hip left   leg pain also  . Chronic back pain   . Chronic kidney disease (CKD), stage III (moderate) (HCC)   . Depression   . Diverticulosis   . Early cataracts, bilateral   . Esophageal stricture   . Gastritis   . GERD (gastroesophageal reflux disease)   . Glaucoma   . Heart murmur     " some doctors say that I have one some say that I don"t "  . Hiatal hernia   . History of blood transfusion    "@ least w/1st knee OR"  . History of gout   . History of kidney stones   . Hyperlipidemia   . Hypertension   . Intestinal obstruction ([place])   . Kidney stones   . Obesity   . Osteoarthritis   . Osteoporosis   . Pericarditis 2016  . Presence of permanent cardiac pacemaker   . Sjogren's disease ([place])   . Thyroid disease   . Type  II diabetes mellitus ([place])   . Vitamin B12 deficiency     Past Surgical History: Past Surgical History:  Procedure Laterality Date  . ABDOMINAL HYSTERECTOMY     complete  . CHOLECYSTECTOMY OPEN    . COLECTOMY     for rectovaginal fistula  . COLONOSCOPY  01/07/2012   Procedure: COLONOSCOPY;  Surgeon: Lafayette Dragon, MD;  Location: [facility];  Service: Endoscopy;  Laterality: N/A;  . EP IMPLANTABLE DEVICE N/A 06/18/2015   Procedure: Pacemaker Implant;  Surgeon: Evans Lance, MD;  Location: [facility];  Service: Cardiovascular;  Laterality: N/A;  . ESOPHAGOGASTRODUODENOSCOPY N/A 11/24/2018   Procedure: ESOPHAGOGASTRODUODENOSCOPY (EGD);  Surgeon: Yetta Flock, MD;  Location: [facility];  Service: Gastroenterology;  Laterality: N/A;  . ESOPHAGOGASTRODUODENOSCOPY (EGD) WITH  ESOPHAGEAL DILATION    . ESOPHAGOGASTRODUODENOSCOPY (EGD) WITH PROPOFOL N/A 08/12/2015   Procedure: ESOPHAGOGASTRODUODENOSCOPY (EGD) WITH PROPOFOL;  Surgeon: Manus Gunning, MD;  Location: [place];  Service: Gastroenterology;  Laterality: N/A;  . EYE SURGERY Bilateral    cataracts  . HOT HEMOSTASIS N/A 11/24/2018   Procedure: HOT HEMOSTASIS (ARGON PLASMA COAGULATION/BICAP);  Surgeon: Yetta Flock, MD;  Location: [facility];  Service: Gastroenterology;  Laterality: N/A;  . I & D EXTREMITY Right 08/06/2016   Procedure: DEBRIDEMENT PIP RIGHT RING FINGER;  Surgeon: Daryll Brod, MD;  Location: [facility];  Service: Orthopedics;  Laterality: Right;  . INSERT / REPLACE / REMOVE PACEMAKER    . JOINT REPLACEMENT    . KNEE ARTHROSCOPY Right   . MASS EXCISION Right 08/06/2016   Procedure: EXCISION CYST;  Surgeon: Daryll Brod, MD;  Location: [place];  Service: Orthopedics;  Laterality: Right;  . REVISION TOTAL KNEE ARTHROPLASTY Left   . TOTAL KNEE ARTHROPLASTY Bilateral     Current Medications: Current Meds  Medication Sig  . ACCU-CHEK AVIVA PLUS test strip 1 each by Other route daily.   Marland Kitchen acetaminophen (TYLENOL) 650 MG CR tablet Take 1,300 mg by mouth at bedtime as needed for pain.  Marland Kitchen amiodarone (PACERONE) 200 MG tablet Take 1 tablet (200 mg total) by mouth daily.  . BD PEN NEEDLE NANO U/F 32G X 4 MM MISC See admin instructions.  . carvedilol (COREG) 3.125 MG tablet Take 2 tablets in the AM and 1 tablet in the PM  . Cholecalciferol (VITAMIN D3) 25 MCG (1000 UT) CAPS Take 1,000 Units by mouth daily.  . dorzolamide (TRUSOPT) 2 % ophthalmic solution Place 1 drop into the right eye 2 (two) times daily.  Marland Kitchen ELIQUIS 5 MG TABS tablet TAKE ONE TABLET TWICE DAILY  . febuxostat (ULORIC) 40 MG tablet Take 1 tablet (40 mg total) by mouth every Monday, Wednesday, and Friday.  . ferrous sulfate 325 (65 FE) MG EC tablet Take 1 tablet (325 mg total) by mouth 2 (two) times daily. (Patient taking  differently: Take 325 mg by mouth 3 (three) times daily.)  . gabapentin (NEURONTIN) 300 MG capsule Take 1 capsule (300 mg total) by mouth daily.  Marland Kitchen gabapentin (NEURONTIN) 400 MG capsule Take 1 capsule (400 mg total) by mouth at bedtime.  Marland Kitchen JANUVIA 50 MG tablet Take 1 tablet (50 mg total) by mouth daily.  Marland Kitchen levothyroxine (SYNTHROID) 50 MCG tablet Take 1 tablet (50 mcg total) by mouth at bedtime.  . montelukast (SINGULAIR) 10 MG tablet Take 1 tablet (10 mg total) by mouth at bedtime.  Marland Kitchen nystatin (NYAMYC) powder APPLY TOPICALLY TO LEFT ABDOMINAL FOLD AND GROIN TWICE A DAY  . pantoprazole (  PROTONIX) 40 MG tablet Take 1 tablet (40 mg total) by mouth daily.  . pentoxifylline (TRENTAL) 400 MG CR tablet Take 1 tablet (400 mg total) by mouth daily.  . Polyethylene Glycol 3350 (MIRALAX PO) Take 17 g by mouth daily.  Marland Kitchen rOPINIRole (REQUIP) 0.5 MG tablet SMARTSIG:1 Tablet(s) By Mouth Every Evening  . rosuvastatin (CRESTOR) 5 MG tablet Take 1 tablet (5 mg total) by mouth daily.  Marland Kitchen senna-docusate (SENOKOT-S) 8.6-50 MG tablet Take 1 tablet by mouth at bedtime.  . traMADol (ULTRAM) 50 MG tablet Take 50 mg by mouth 2 (two) times daily as needed.  . TRESIBA FLEXTOUCH 100 UNIT/ML FlexTouch Pen SMARTSIG:4-8 Unit(s) SUB-Q Daily  . [DISCONTINUED] furosemide (LASIX) 40 MG tablet Take 1 tablet (40 mg total) by mouth daily.  . [DISCONTINUED] traZODone (DESYREL) 50 MG tablet Take 0.5 tablets (25 mg total) by mouth at bedtime. 0.5 tablet to = 25 mg    Allergies:    Norvasc [amlodipine besylate], Avapro [irbesartan], Glucophage [metformin hydrochloride], Lisinopril, Losartan, Morphine and related, Simvastatin, Trulicity [dulaglutide], and Fiber   Social History: Social History   Socioeconomic History  . Marital status: Divorced    Spouse name: Not on file  . Number of children: 2  . Years of education: Not on file  . Highest education level: Not on file  Occupational History  . Occupation: retired  Tobacco Use   . Smoking status: Former Smoker    Packs/day: 2.00    Years: 3.00    Pack years: 6.00    Types: Cigarettes    Quit date: 06/23/1983    Years since quitting: 37.0  . Smokeless tobacco: Never Used  Vaping Use  . Vaping Use: Never used  Substance and Sexual Activity  . Alcohol use: Yes    Alcohol/week: 2.0 standard drinks    Types: 2 Glasses of wine per week    Comment: occ  . Drug use: No  . Sexual activity: Never  Other Topics Concern  . Not on file  Social History Narrative  . Not on file   Social Determinants of Health   Financial Resource Strain: Not on file  Food Insecurity: Not on file  Transportation Needs: Not on file  Physical Activity: Not on file  Stress: Not on file  Social Connections: Not on file    Family History: The patient's family history includes Diabetes in her brother and mother; Hypertension in her sister. There is no history of Colon cancer, Heart attack, Stroke, Colon polyps, Esophageal cancer, Rectal cancer, or Stomach cancer.  ROS:   All other ROS reviewed and negative. Pertinent positives noted in the HPI.     EKGs/Labs/Other Studies Reviewed:   The following studies were personally reviewed by me today:  EKG:  EKG is ordered today.  The ekg ordered today demonstrates atrial fibrillation with RVR heart rate 102, right bundle branch block noted with right axis deviation, nonspecific ST-T changes, and was personally reviewed by me.   TTE 07/13/2017 - Left ventricle: The cavity size was normal. Wall thickness was  normal. Systolic function was normal. The estimated ejection  fraction was in the range of 55% to 60%. Wall motion was normal;  there were no regional wall motion abnormalities.  - Mitral valve: Calcified annulus. There was mild regurgitation.  - Right ventricle: The cavity size was moderately dilated. Wall  thickness was normal. Pacer wire or catheter noted in right  ventricle.  - Right atrium: The atrium was moderately  dilated.   Recent  Labs: 07/16/2020: Hemoglobin 9.2; Platelet Count 220   Recent Lipid Panel No results found for: CHOL, TRIG, HDL, CHOLHDL, VLDL, LDLCALC, LDLDIRECT  Physical Exam:   VS:  BP 138/86   Pulse (!) 102   Ht _0  (1.473 m)   Wt 184 lb 9.6 oz (83.7 kg)   SpO2 98%   BMI 38.58 kg/m    Wt Readings from Last 3 Encounters:  07/19/20 184 lb 9.6 oz (83.7 kg)  07/11/20 183 lb 12.8 oz (83.4 kg)  06/25/20 184 lb 9.6 oz (83.7 kg)    General: Well nourished, well developed, in no acute distress Head: Atraumatic, normal size  Eyes: PEERLA, EOMI  Neck: Supple, JVD 15 to 17 cm of water Endocrine: No thryomegaly Cardiac: Normal S1, S2; irregular rhythm, no murmurs Lungs: Crackles at the lung bases Abd: Soft, nontender, no hepatomegaly  Ext: 2+ pitting edema Musculoskeletal: No deformities, BUE and BLE strength normal and equal Skin: Warm and dry, no rashes   Neuro: Alert and oriented to person, place, time, and situation, CNII-XII grossly intact, no focal deficits  Psych: Normal mood and affect   ASSESSMENT:   Tricia Gonzales is a 80 y.o. female who presents for the following: 1. SOB (shortness of breath) on exertion   2. Persistent atrial fibrillation ([place])   3. Secondary hypercoagulable state ([place])   4. Sinus node dysfunction (HCC)   5. Essential hypertension     PLAN:   1. SOB (shortness of breath) on exertion -Shortness of breath for 1 month.  Has been in A. fib for at least 1 month.  Rate appears to be controlled.  She actually may be in atrial flutter. -She is volume up.  She needs a repeat evaluation.  She needs a CBC, BMP, BNP.  She needs a repeat echocardiogram.  She needs a chest x-ray. -Increase Lasix to 80 mg a day for 3 days and then resume 40 mg a day.  Salt reduction strategies advised. -Unclear if her heart failure has been caused by atrial fibrillation or vice versa.  We will nonetheless set her up for a cardioversion.  She reports she would like to avoid a  TEE.  We will set her up for cardioversion on February 9. -No missed doses of Eliquis last 2 weeks.  Instructed to continue not miss any doses moving forward.  2. Persistent atrial fibrillation (HCC) -CHADSVASC=5.  In atrial fibrillation.  TSH on 07/04/2020 ->3.55.  Has been it for a month.  This coincided with shortness of breath.  She is either short of breath from A. fib or A. fib resulted in heart failure.  Increase Lasix as above.  Repeat echocardiogram and lab work and chest x-ray as above.  Plan for cardioversion on February 9.  Risks and benefits discussed in office.  She is not missed any Eliquis in the last 2 weeks.  She will have been on it for 3 weeks by the time she has her cardioversion. -Continue Coreg for rate control. -I recommended amiodarone for rhythm control.  We will start her on amiodarone 200 mg twice a day for 7 days and then pursue 200 mg a day.  Given her CKD and now congestive heart failure I am reluctant to pursue any 1c agents.  I think QT prolonging agents such as Tikosyn would also be a major issue for her.  Given her age I think amiodarone is likely a decent choice.  3. Secondary hypercoagulable state ([place]) -On Eliquis as above.  4.  Sinus node dysfunction (HCC) -Status post pacemaker implantation in 2016.  She is V paced 50 to 70% of the time when not in A. fib.  Unclear if this contributed to her cardiomyopathy.  We will see how she does back in sinus rhythm.  5. Essential hypertension -Continue Coreg 3.125 mg twice daily.  BP appears to be stable.  Shared Decision Making/Informed Consent The risks (stroke, cardiac arrhythmias rarely resulting in the need for a temporary or permanent pacemaker, skin irritation or burns and complications associated with conscious sedation including aspiration, arrhythmia, respiratory failure and death), benefits (restoration of normal sinus rhythm) and alternatives of a direct current cardioversion were explained in detail to Ms.  Kelemen and she agrees to proceed.   Disposition: Return in about 4 weeks (around 08/16/2020).  Medication Adjustments/Labs and Tests Ordered: Current medicines are reviewed at length with the patient today.  Concerns regarding medicines are outlined above.  Orders Placed This Encounter  Procedures  . DG Chest 2 View  . Brain natriuretic peptide  . Basic metabolic panel  . CBC  . EKG 12-Lead  . ECHOCARDIOGRAM COMPLETE   Meds ordered this encounter  Medications  . amiodarone (PACERONE) 200 MG tablet    Sig: Take 1 tablet (200 mg total) by mouth daily.    Dispense:  90 tablet    Refill:  3  . furosemide (LASIX) 40 MG tablet    Sig: Take 1 tablet (40 mg total) by mouth daily.    Dispense:  30 tablet    Refill:  3    Patient Instructions  Medication Instructions:  Increase Lasix to 80 mg for 3 days, then go back to 40 mg daily  Start Amiodarone 200 mg twice daily for 7 days, then 200 mg daily   *If you need a refill on your cardiac medications before your next appointment, please call your pharmacy*   Lab Work: BNP, BMET, CBC COVID TESTING: 02/07- 11:50- [place], [place]  If you have labs (blood work) drawn today and your tests are completely normal, you will receive your results only by: Marland Kitchen MyChart Message (if you have MyChart) OR . A paper copy in the mail If you have any lab test that is abnormal or we need to change your treatment, we will call you to review the results.   Testing/Procedures: Echocardiogram - Your physician has requested that you have an echocardiogram. Echocardiography is a painless test that uses sound waves to create images of your heart. It provides your doctor with information about the size and shape of your heart and how well your heart's chambers and valves are working. This procedure takes approximately one hour. There are no restrictions for this procedure. This will be performed at our [facility] location - [address], [address].  Chest xray - Your physician has requested that you have a chest xray, is a fast and painless imaging test that uses certain electromagnetic waves to create pictures of the structures in and around your chest. This test can help diagnose and monitor conditions such as pneumonia and other lung issues his will be done at [facility] [place], [place]. If you should need to call them their phone number is [phone].  Your physician has requested that you have a Cardioversion. Electrical Cardioversion uses a jolt of electricity to your heart either through paddles or wired patches attached to your chest. This is a controlled, usually prescheduled, procedure. This procedure is done  at the hospital and you are not awake during the procedure. You usually go home the day of the procedure. Please see the instruction sheet given to you today for more information.     Follow-Up: At [facility], you and your health needs are our priority.  As part of our continuing mission to provide you with exceptional heart care, we have created designated Provider Care Teams.  These Care Teams include your primary Cardiologist (physician) and Advanced Practice Providers (APPs -  Physician Assistants and Nurse Practitioners) who all work together to provide you with the care you need, when you need it.  We recommend signing up for the patient portal called "MyChart".  Sign up information is provided on this After Visit Summary.  MyChart is used to connect with patients for Virtual Visits (Telemedicine).  Patients are able to view lab/test results, encounter notes, upcoming appointments, etc.  Non-urgent messages can be sent to your provider as well.   To learn more about what you can do with MyChart, go to [email or web address].    Your next appointment:   4 week(s)  The format for your next appointment:   In Person  Provider:   Eleonore Chiquito, MD   Other Instructions  You are  scheduled for a Cardioversion on February 9th with Dr. Gasper Sells.  Please arrive at the [facility] (Main Entrance A) at [facility]: [address] [place], [place] [address] at 10:30 am. (1 hour prior to procedure unless lab work is needed; if lab work is needed arrive 1.5 hours ahead)  DIET: Nothing to eat or drink after midnight except a sip of water with medications (see medication instructions below)  Medication Instructions: Hold Insulin/diabetic medication morning of.  Continue your anticoagulant: Eliquis  You will need to continue your anticoagulant after your procedure until you  are told by your  Provider that it is safe to stop   Labs: CBC, BMET today  COVID TESTING:  You must have a responsible person to drive you home and stay in the waiting area during your procedure. Failure to do so could result in cancellation.  Bring your insurance cards.  *Special Note: Every effort is made to have your procedure done on time. Occasionally there are emergencies that occur at the hospital that may cause delays. Please be patient if a delay does occur.       Time Spent with Patient: I have spent a total of 35 minutes with patient reviewing hospital notes, telemetry, EKGs, labs and examining the patient as well as establishing an assessment and plan that was discussed with the patient.  > 50% of time was spent in direct patient care.  Signed, Addison Naegeli. Audie Box, [place]  [address] [place], [place] [address] [phone]  07/19/2020 4:28 PM

## 2020-07-18 NOTE — H&P (View-Only) (Signed)
Cardiology Office Note:   Date:  07/19/2020  NAME:  Tricia Gonzales    MRN: [id number] DOB:  1940/09/10   PCP:  Carol Ada, MD  Cardiologist:  No primary care provider on file.  Electrophysiologist:  Cristopher Peru, MD   Referring MD: Carol Ada, MD   Chief Complaint  Patient presents with  . Shortness of Breath   History of Present Illness:   Tricia Gonzales is a 80 y.o. female with a hx of diabetes, hypertension, CKD, paroxysmal atrial fibrillation who is being seen today for the evaluation of shortness of breath at the request of Carol Ada, MD.  Followed by EP for pacemaker implantation due to junctional bradycardia.  Has been in atrial fibrillation with rapid response.  Evaluated by the A. fib clinic and intervention deferred to [place]. She reports she has been short of breath for nearly 1 month.  This is coincided with her going back in atrial fibrillation.  There was a missed dose of Eliquis and then cardioversion was deferred until she saw me.  She reports anytime she exerts herself she can get short of breath.  She also reports intermittent chest tightness which mainly occurs with laying down.  She does not feel her heart racing.  She does endorse lower extremity edema.  Medical history significant for diabetes, CKD, hypertension.  She also has sick sinus syndrome status post pacemaker implantation.  She has been paced 50 to 70% of the time most interrogations that I been able to review.  Most recent creatinine 1.96.  No recent chest x-ray.  No recent echocardiogram.  She needs a full evaluation of her shortness of breath.  She clearly has congestive heart failure and I wonder how much A. fib is playing into this versus congestive heart failure causing A. fib.  She reports no missed doses of Eliquis in the last 2 weeks.  We can set her up for cardioversion in the next 1 to 2 weeks.  She also needs to increase her Lasix and to have a repeat evaluation.  Problem List 1. Paroxysmal  Afib -CHADSVASC=5 2. Junctional bradycardia s/p ppm 2016 3. DM -A1c 6.5 4. HTN 5. CKD -1.96 6. HLD -T chol 151, HDL 44, LDL 84, TG 131 7. Anemia -Hgb 9.2  Past Medical History: Past Medical History:  Diagnosis Date  . Anemia   . 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 Anxiety   . Arthritis   . Atrial fibrillation ([place])   . Avascular necrosis (HCC) hip left   leg pain also  . Chronic back pain   . Chronic kidney disease (CKD), stage III (moderate) (HCC)   . Depression   . Diverticulosis   . Early cataracts, bilateral   . Esophageal stricture   . Gastritis   . GERD (gastroesophageal reflux disease)   . Glaucoma   . Heart murmur     " some doctors say that I have one some say that I don"t "  . Hiatal hernia   . History of blood transfusion    "@ least w/1st knee OR"  . History of gout   . History of kidney stones   . Hyperlipidemia   . Hypertension   . Intestinal obstruction ([place])   . Kidney stones   . Obesity   . Osteoarthritis   . Osteoporosis   . Pericarditis 2016  . Presence of permanent cardiac pacemaker   . Sjogren's disease ([place])   . Thyroid disease   . Type  II diabetes mellitus ([place])   . Vitamin B12 deficiency     Past Surgical History: Past Surgical History:  Procedure Laterality Date  . ABDOMINAL HYSTERECTOMY     complete  . CHOLECYSTECTOMY OPEN    . COLECTOMY     for rectovaginal fistula  . COLONOSCOPY  01/07/2012   Procedure: COLONOSCOPY;  Surgeon: Lafayette Dragon, MD;  Location: [facility];  Service: Endoscopy;  Laterality: N/A;  . EP IMPLANTABLE DEVICE N/A 06/18/2015   Procedure: Pacemaker Implant;  Surgeon: Evans Lance, MD;  Location: [facility];  Service: Cardiovascular;  Laterality: N/A;  . ESOPHAGOGASTRODUODENOSCOPY N/A 11/24/2018   Procedure: ESOPHAGOGASTRODUODENOSCOPY (EGD);  Surgeon: Yetta Flock, MD;  Location: [facility];  Service: Gastroenterology;  Laterality: N/A;  . ESOPHAGOGASTRODUODENOSCOPY (EGD) WITH  ESOPHAGEAL DILATION    . ESOPHAGOGASTRODUODENOSCOPY (EGD) WITH PROPOFOL N/A 08/12/2015   Procedure: ESOPHAGOGASTRODUODENOSCOPY (EGD) WITH PROPOFOL;  Surgeon: Manus Gunning, MD;  Location: [place];  Service: Gastroenterology;  Laterality: N/A;  . EYE SURGERY Bilateral    cataracts  . HOT HEMOSTASIS N/A 11/24/2018   Procedure: HOT HEMOSTASIS (ARGON PLASMA COAGULATION/BICAP);  Surgeon: Yetta Flock, MD;  Location: [facility];  Service: Gastroenterology;  Laterality: N/A;  . I & D EXTREMITY Right 08/06/2016   Procedure: DEBRIDEMENT PIP RIGHT RING FINGER;  Surgeon: Daryll Brod, MD;  Location: [place];  Service: Orthopedics;  Laterality: Right;  . INSERT / REPLACE / REMOVE PACEMAKER    . JOINT REPLACEMENT    . KNEE ARTHROSCOPY Right   . MASS EXCISION Right 08/06/2016   Procedure: EXCISION CYST;  Surgeon: Daryll Brod, MD;  Location: [place];  Service: Orthopedics;  Laterality: Right;  . REVISION TOTAL KNEE ARTHROPLASTY Left   . TOTAL KNEE ARTHROPLASTY Bilateral     Current Medications: Current Meds  Medication Sig  . ACCU-CHEK AVIVA PLUS test strip 1 each by Other route daily.   Marland Kitchen acetaminophen (TYLENOL) 650 MG CR tablet Take 1,300 mg by mouth at bedtime as needed for pain.  Marland Kitchen amiodarone (PACERONE) 200 MG tablet Take 1 tablet (200 mg total) by mouth daily.  . BD PEN NEEDLE NANO U/F 32G X 4 MM MISC See admin instructions.  . carvedilol (COREG) 3.125 MG tablet Take 2 tablets in the AM and 1 tablet in the PM  . Cholecalciferol (VITAMIN D3) 25 MCG (1000 UT) CAPS Take 1,000 Units by mouth daily.  . dorzolamide (TRUSOPT) 2 % ophthalmic solution Place 1 drop into the right eye 2 (two) times daily.  Marland Kitchen ELIQUIS 5 MG TABS tablet TAKE ONE TABLET TWICE DAILY  . febuxostat (ULORIC) 40 MG tablet Take 1 tablet (40 mg total) by mouth every Monday, Wednesday, and Friday.  . ferrous sulfate 325 (65 FE) MG EC tablet Take 1 tablet (325 mg total) by mouth 2 (two) times daily. (Patient taking  differently: Take 325 mg by mouth 3 (three) times daily.)  . gabapentin (NEURONTIN) 300 MG capsule Take 1 capsule (300 mg total) by mouth daily.  Marland Kitchen gabapentin (NEURONTIN) 400 MG capsule Take 1 capsule (400 mg total) by mouth at bedtime.  Marland Kitchen JANUVIA 50 MG tablet Take 1 tablet (50 mg total) by mouth daily.  Marland Kitchen levothyroxine (SYNTHROID) 50 MCG tablet Take 1 tablet (50 mcg total) by mouth at bedtime.  . montelukast (SINGULAIR) 10 MG tablet Take 1 tablet (10 mg total) by mouth at bedtime.  Marland Kitchen nystatin (NYAMYC) powder APPLY TOPICALLY TO LEFT ABDOMINAL FOLD AND GROIN TWICE A DAY  . pantoprazole (  PROTONIX) 40 MG tablet Take 1 tablet (40 mg total) by mouth daily.  . pentoxifylline (TRENTAL) 400 MG CR tablet Take 1 tablet (400 mg total) by mouth daily.  . Polyethylene Glycol 3350 (MIRALAX PO) Take 17 g by mouth daily.  Marland Kitchen rOPINIRole (REQUIP) 0.5 MG tablet SMARTSIG:1 Tablet(s) By Mouth Every Evening  . rosuvastatin (CRESTOR) 5 MG tablet Take 1 tablet (5 mg total) by mouth daily.  Marland Kitchen senna-docusate (SENOKOT-S) 8.6-50 MG tablet Take 1 tablet by mouth at bedtime.  . traMADol (ULTRAM) 50 MG tablet Take 50 mg by mouth 2 (two) times daily as needed.  . TRESIBA FLEXTOUCH 100 UNIT/ML FlexTouch Pen SMARTSIG:4-8 Unit(s) SUB-Q Daily  . [DISCONTINUED] furosemide (LASIX) 40 MG tablet Take 1 tablet (40 mg total) by mouth daily.  . [DISCONTINUED] traZODone (DESYREL) 50 MG tablet Take 0.5 tablets (25 mg total) by mouth at bedtime. 0.5 tablet to = 25 mg    Allergies:    Norvasc [amlodipine besylate], Avapro [irbesartan], Glucophage [metformin hydrochloride], Lisinopril, Losartan, Morphine and related, Simvastatin, Trulicity [dulaglutide], and Fiber   Social History: Social History   Socioeconomic History  . Marital status: Divorced    Spouse name: Not on file  . Number of children: 2  . Years of education: Not on file  . Highest education level: Not on file  Occupational History  . Occupation: retired  Tobacco Use   . Smoking status: Former Smoker    Packs/day: 2.00    Years: 3.00    Pack years: 6.00    Types: Cigarettes    Quit date: 06/23/1983    Years since quitting: 37.0  . Smokeless tobacco: Never Used  Vaping Use  . Vaping Use: Never used  Substance and Sexual Activity  . Alcohol use: Yes    Alcohol/week: 2.0 standard drinks    Types: 2 Glasses of wine per week    Comment: occ  . Drug use: No  . Sexual activity: Never  Other Topics Concern  . Not on file  Social History Narrative  . Not on file   Social Determinants of Health   Financial Resource Strain: Not on file  Food Insecurity: Not on file  Transportation Needs: Not on file  Physical Activity: Not on file  Stress: Not on file  Social Connections: Not on file    Family History: The patient's family history includes Diabetes in her brother and mother; Hypertension in her sister. There is no history of Colon cancer, Heart attack, Stroke, Colon polyps, Esophageal cancer, Rectal cancer, or Stomach cancer.  ROS:   All other ROS reviewed and negative. Pertinent positives noted in the HPI.     EKGs/Labs/Other Studies Reviewed:   The following studies were personally reviewed by me today:  EKG:  EKG is ordered today.  The ekg ordered today demonstrates atrial fibrillation with RVR heart rate 102, right bundle branch block noted with right axis deviation, nonspecific ST-T changes, and was personally reviewed by me.   TTE 07/13/2017 - Left ventricle: The cavity size was normal. Wall thickness was  normal. Systolic function was normal. The estimated ejection  fraction was in the range of 55% to 60%. Wall motion was normal;  there were no regional wall motion abnormalities.  - Mitral valve: Calcified annulus. There was mild regurgitation.  - Right ventricle: The cavity size was moderately dilated. Wall  thickness was normal. Pacer wire or catheter noted in right  ventricle.  - Right atrium: The atrium was moderately  dilated.   Recent  Labs: 07/16/2020: Hemoglobin 9.2; Platelet Count 220   Recent Lipid Panel No results found for: CHOL, TRIG, HDL, CHOLHDL, VLDL, LDLCALC, LDLDIRECT  Physical Exam:   VS:  BP 138/86   Pulse (!) 102   Ht _0  (1.473 m)   Wt 184 lb 9.6 oz (83.7 kg)   SpO2 98%   BMI 38.58 kg/m    Wt Readings from Last 3 Encounters:  07/19/20 184 lb 9.6 oz (83.7 kg)  07/11/20 183 lb 12.8 oz (83.4 kg)  06/25/20 184 lb 9.6 oz (83.7 kg)    General: Well nourished, well developed, in no acute distress Head: Atraumatic, normal size  Eyes: PEERLA, EOMI  Neck: Supple, JVD 15 to 17 cm of water Endocrine: No thryomegaly Cardiac: Normal S1, S2; irregular rhythm, no murmurs Lungs: Crackles at the lung bases Abd: Soft, nontender, no hepatomegaly  Ext: 2+ pitting edema Musculoskeletal: No deformities, BUE and BLE strength normal and equal Skin: Warm and dry, no rashes   Neuro: Alert and oriented to person, place, time, and situation, CNII-XII grossly intact, no focal deficits  Psych: Normal mood and affect   ASSESSMENT:   LILYANN GRAVELLE is a 80 y.o. female who presents for the following: 1. SOB (shortness of breath) on exertion   2. Persistent atrial fibrillation ([place])   3. Secondary hypercoagulable state ([place])   4. Sinus node dysfunction (HCC)   5. Essential hypertension     PLAN:   1. SOB (shortness of breath) on exertion -Shortness of breath for 1 month.  Has been in A. fib for at least 1 month.  Rate appears to be controlled.  She actually may be in atrial flutter. -She is volume up.  She needs a repeat evaluation.  She needs a CBC, BMP, BNP.  She needs a repeat echocardiogram.  She needs a chest x-ray. -Increase Lasix to 80 mg a day for 3 days and then resume 40 mg a day.  Salt reduction strategies advised. -Unclear if her heart failure has been caused by atrial fibrillation or vice versa.  We will nonetheless set her up for a cardioversion.  She reports she would like to avoid a  TEE.  We will set her up for cardioversion on February 9. -No missed doses of Eliquis last 2 weeks.  Instructed to continue not miss any doses moving forward.  2. Persistent atrial fibrillation (HCC) -CHADSVASC=5.  In atrial fibrillation.  TSH on 07/04/2020 ->3.55.  Has been it for a month.  This coincided with shortness of breath.  She is either short of breath from A. fib or A. fib resulted in heart failure.  Increase Lasix as above.  Repeat echocardiogram and lab work and chest x-ray as above.  Plan for cardioversion on February 9.  Risks and benefits discussed in office.  She is not missed any Eliquis in the last 2 weeks.  She will have been on it for 3 weeks by the time she has her cardioversion. -Continue Coreg for rate control. -I recommended amiodarone for rhythm control.  We will start her on amiodarone 200 mg twice a day for 7 days and then pursue 200 mg a day.  Given her CKD and now congestive heart failure I am reluctant to pursue any 1c agents.  I think QT prolonging agents such as Tikosyn would also be a major issue for her.  Given her age I think amiodarone is likely a decent choice.  3. Secondary hypercoagulable state ([place]) -On Eliquis as above.  4.  Sinus node dysfunction (HCC) -Status post pacemaker implantation in 2016.  She is V paced 50 to 70% of the time when not in A. fib.  Unclear if this contributed to her cardiomyopathy.  We will see how she does back in sinus rhythm.  5. Essential hypertension -Continue Coreg 3.125 mg twice daily.  BP appears to be stable.  Shared Decision Making/Informed Consent The risks (stroke, cardiac arrhythmias rarely resulting in the need for a temporary or permanent pacemaker, skin irritation or burns and complications associated with conscious sedation including aspiration, arrhythmia, respiratory failure and death), benefits (restoration of normal sinus rhythm) and alternatives of a direct current cardioversion were explained in detail to Ms.  Kelemen and she agrees to proceed.   Disposition: Return in about 4 weeks (around 08/16/2020).  Medication Adjustments/Labs and Tests Ordered: Current medicines are reviewed at length with the patient today.  Concerns regarding medicines are outlined above.  Orders Placed This Encounter  Procedures  . DG Chest 2 View  . Brain natriuretic peptide  . Basic metabolic panel  . CBC  . EKG 12-Lead  . ECHOCARDIOGRAM COMPLETE   Meds ordered this encounter  Medications  . amiodarone (PACERONE) 200 MG tablet    Sig: Take 1 tablet (200 mg total) by mouth daily.    Dispense:  90 tablet    Refill:  3  . furosemide (LASIX) 40 MG tablet    Sig: Take 1 tablet (40 mg total) by mouth daily.    Dispense:  30 tablet    Refill:  3    Patient Instructions  Medication Instructions:  Increase Lasix to 80 mg for 3 days, then go back to 40 mg daily  Start Amiodarone 200 mg twice daily for 7 days, then 200 mg daily   *If you need a refill on your cardiac medications before your next appointment, please call your pharmacy*   Lab Work: BNP, BMET, CBC COVID TESTING: 02/07- 11:50- [place] [facility], [place]  If you have labs (blood work) drawn today and your tests are completely normal, you will receive your results only by: Marland Kitchen MyChart Message (if you have MyChart) OR . A paper copy in the mail If you have any lab test that is abnormal or we need to change your treatment, we will call you to review the results.   Testing/Procedures: Echocardiogram - Your physician has requested that you have an echocardiogram. Echocardiography is a painless test that uses sound waves to create images of your heart. It provides your doctor with information about the size and shape of your heart and how well your heart's chambers and valves are working. This procedure takes approximately one hour. There are no restrictions for this procedure. This will be performed at our [facility] location - [address], [address].  Chest xray - Your physician has requested that you have a chest xray, is a fast and painless imaging test that uses certain electromagnetic waves to create pictures of the structures in and around your chest. This test can help diagnose and monitor conditions such as pneumonia and other lung issues his will be done at [facility], [place]. If you should need to call them their phone number is [phone].  Your physician has requested that you have a Cardioversion. Electrical Cardioversion uses a jolt of electricity to your heart either through paddles or wired patches attached to your chest. This is a controlled, usually prescheduled, procedure. This procedure is done  at the hospital and you are not awake during the procedure. You usually go home the day of the procedure. Please see the instruction sheet given to you today for more information.     Follow-Up: At [facility], you and your health needs are our priority.  As part of our continuing mission to provide you with exceptional heart care, we have created designated Provider Care Teams.  These Care Teams include your primary Cardiologist (physician) and Advanced Practice Providers (APPs -  Physician Assistants and Nurse Practitioners) who all work together to provide you with the care you need, when you need it.  We recommend signing up for the patient portal called "MyChart".  Sign up information is provided on this After Visit Summary.  MyChart is used to connect with patients for Virtual Visits (Telemedicine).  Patients are able to view lab/test results, encounter notes, upcoming appointments, etc.  Non-urgent messages can be sent to your provider as well.   To learn more about what you can do with MyChart, go to [email or web address].    Your next appointment:   4 week(s)  The format for your next appointment:   In Person  Provider:   Eleonore Chiquito, MD   Other Instructions  You are  scheduled for a Cardioversion on February 9th with Dr. Gasper Sells.  Please arrive at the [facility] (Main Entrance A) at [facility]: [address] [place], [place] [address] at 10:30 am. (1 hour prior to procedure unless lab work is needed; if lab work is needed arrive 1.5 hours ahead)  DIET: Nothing to eat or drink after midnight except a sip of water with medications (see medication instructions below)  Medication Instructions: Hold Insulin/diabetic medication morning of.  Continue your anticoagulant: Eliquis  You will need to continue your anticoagulant after your procedure until you  are told by your  Provider that it is safe to stop   Labs: CBC, BMET today  COVID TESTING:  You must have a responsible person to drive you home and stay in the waiting area during your procedure. Failure to do so could result in cancellation.  Bring your insurance cards.  *Special Note: Every effort is made to have your procedure done on time. Occasionally there are emergencies that occur at the hospital that may cause delays. Please be patient if a delay does occur.       Time Spent with Patient: I have spent a total of 35 minutes with patient reviewing hospital notes, telemetry, EKGs, labs and examining the patient as well as establishing an assessment and plan that was discussed with the patient.  > 50% of time was spent in direct patient care.  Signed, Addison Naegeli. Audie Box, [place]  [address] [place], [place] [address] [phone]  07/19/2020 4:28 PM

## 2020-07-18 NOTE — Progress Notes (Signed)
Remote pacemaker transmission.   

## 2020-07-19 ENCOUNTER — Ambulatory Visit (INDEPENDENT_AMBULATORY_CARE_PROVIDER_SITE_OTHER): Payer: Medicare Other | Admitting: Cardiovascular Disease

## 2020-07-19 ENCOUNTER — Other Ambulatory Visit: Payer: Self-pay

## 2020-07-19 ENCOUNTER — Encounter: Payer: Self-pay | Admitting: Cardiovascular Disease

## 2020-07-19 VITALS — BP 138/86 | HR 102 | Ht <= 58 in | Wt 184.6 lb

## 2020-07-19 DIAGNOSIS — D6869 Other thrombophilia: Secondary | ICD-10-CM | POA: Diagnosis not present

## 2020-07-19 DIAGNOSIS — I495 Sick sinus syndrome: Secondary | ICD-10-CM

## 2020-07-19 DIAGNOSIS — I4819 Other persistent atrial fibrillation: Secondary | ICD-10-CM | POA: Diagnosis not present

## 2020-07-19 DIAGNOSIS — R0602 Shortness of breath: Secondary | ICD-10-CM | POA: Diagnosis not present

## 2020-07-19 DIAGNOSIS — I10 Essential (primary) hypertension: Secondary | ICD-10-CM

## 2020-07-19 MED ORDER — AMIODARONE HCL 200 MG PO TABS: 200.0000 mg | ORAL_TABLET | Freq: Every day | ORAL | 3 refills | Status: DC

## 2020-07-19 MED ORDER — FUROSEMIDE 40 MG PO TABS: 40.0000 mg | ORAL_TABLET | Freq: Every day | ORAL | 3 refills | Status: DC

## 2020-07-19 NOTE — Patient Instructions (Addendum)
Medication Instructions:  Increase Lasix to 80 mg for 3 days, then go back to 40 mg daily  Start Amiodarone 200 mg twice daily for 7 days, then 200 mg daily   *If you need a refill on your cardiac medications before your next appointment, please call your pharmacy*   Lab Work: BNP, BMET, CBC COVID TESTING: 02/07- 11:50- [place] [facility], [place]  If you have labs (blood work) drawn today and your tests are completely normal, you will receive your results only by: Marland Kitchen MyChart Message (if you have MyChart) OR . A paper copy in the mail If you have any lab test that is abnormal or we need to change your treatment, we will call you to review the results.   Testing/Procedures: Echocardiogram - Your physician has requested that you have an echocardiogram. Echocardiography is a painless test that uses sound waves to create images of your heart. It provides your doctor with information about the size and shape of your heart and how well your heart's chambers and valves are working. This procedure takes approximately one hour. There are no restrictions for this procedure. This will be performed at our [facility] location - [address], [address].  Chest xray - Your physician has requested that you have a chest xray, is a fast and painless imaging test that uses certain electromagnetic waves to create pictures of the structures in and around your chest. This test can help diagnose and monitor conditions such as pneumonia and other lung issues his will be done at [place] [facility], [place]. If you should need to call them their phone number is [phone].  Your physician has requested that you have a Cardioversion. Electrical Cardioversion uses a jolt of electricity to your heart either through paddles or wired patches attached to your chest. This is a controlled, usually prescheduled, procedure. This procedure is done at the hospital and you are not awake during the  procedure. You usually go home the day of the procedure. Please see the instruction sheet given to you today for more information.     Follow-Up: At [facility], you and your health needs are our priority.  As part of our continuing mission to provide you with exceptional heart care, we have created designated Provider Care Teams.  These Care Teams include your primary Cardiologist (physician) and Advanced Practice Providers (APPs -  Physician Assistants and Nurse Practitioners) who all work together to provide you with the care you need, when you need it.  We recommend signing up for the patient portal called "MyChart".  Sign up information is provided on this After Visit Summary.  MyChart is used to connect with patients for Virtual Visits (Telemedicine).  Patients are able to view lab/test results, encounter notes, upcoming appointments, etc.  Non-urgent messages can be sent to your provider as well.   To learn more about what you can do with MyChart, go to [email or web address].    Your next appointment:   4 week(s)  The format for your next appointment:   In Person  Provider:   Eleonore Chiquito, MD   Other Instructions  You are scheduled for a Cardioversion on February 9th with Dr. Gasper Sells.  Please arrive at the [facility] (Main Entrance A) at [facility]: [address] [place], [place] [address] at 10:30 am. (1 hour prior to procedure unless lab work is needed; if lab work is needed arrive 1.5 hours ahead)  DIET: Nothing to eat  or drink after midnight except a sip of water with medications (see medication instructions below)  Medication Instructions: Hold Insulin/diabetic medication morning of.  Continue your anticoagulant: Eliquis  You will need to continue your anticoagulant after your procedure until you  are told by your  Provider that it is safe to stop   Labs: CBC, BMET today  COVID TESTING:  You must have a responsible person to drive you home and  stay in the waiting area during your procedure. Failure to do so could result in cancellation.  Bring your insurance cards.  *Special Note: Every effort is made to have your procedure done on time. Occasionally there are emergencies that occur at the hospital that may cause delays. Please be patient if a delay does occur.

## 2020-07-20 LAB — CBC
Hematocrit: 31 % — ABNORMAL LOW (ref 34.0–46.6)
Hemoglobin: 9.9 g/dL — ABNORMAL LOW (ref 11.1–15.9)
MCH: 30.8 pg (ref 26.6–33.0)
MCHC: 31.9 g/dL (ref 31.5–35.7)
MCV: 97 fL (ref 79–97)
Platelets: 257 10*3/uL (ref 150–450)
RBC: 3.21 x10E6/uL — ABNORMAL LOW (ref 3.77–5.28)
RDW: 16.4 % — ABNORMAL HIGH (ref 11.7–15.4)
WBC: 7.6 10*3/uL (ref 3.4–10.8)

## 2020-07-20 LAB — BASIC METABOLIC PANEL
BUN/Creatinine Ratio: 42 — ABNORMAL HIGH (ref 12–28)
BUN: 76 mg/dL (ref 8–27)
CO2: 26 mmol/L (ref 20–29)
Calcium: 9.6 mg/dL (ref 8.7–10.3)
Chloride: 101 mmol/L (ref 96–106)
Creatinine, Ser: 1.79 mg/dL — ABNORMAL HIGH (ref 0.57–1.00)
GFR calc Af Amer: 31 mL/min/{1.73_m2} — ABNORMAL LOW (ref >59)
GFR calc non Af Amer: 27 mL/min/{1.73_m2} — ABNORMAL LOW (ref >59)
Glucose: 163 mg/dL — ABNORMAL HIGH (ref 65–99)
Potassium: 4.8 mmol/L (ref 3.5–5.2)
Sodium: 142 mmol/L (ref 134–144)

## 2020-07-20 LAB — BRAIN NATRIURETIC PEPTIDE: BNP: 1025.7 pg/mL — ABNORMAL HIGH (ref 0.0–100.0)

## 2020-07-23 ENCOUNTER — Other Ambulatory Visit: Payer: Self-pay

## 2020-07-23 ENCOUNTER — Ambulatory Visit
Admission: RE | Admit: 2020-07-23 | Discharge: 2020-07-23 | Disposition: A | Discharge status: Home / Self Care | Payer: Medicare Other | Source: Ambulatory Visit | Attending: Cardiovascular Disease | Admitting: Cardiovascular Disease

## 2020-07-23 DIAGNOSIS — R0602 Shortness of breath: Secondary | ICD-10-CM

## 2020-07-24 ENCOUNTER — Telehealth: Payer: Self-pay

## 2020-07-24 NOTE — Telephone Encounter (Signed)
Returned call to pt . Pt has an injection scheduled at [facility] on 07/30/20 and a cardioversion scheduled on 07/31/20, pt was wondering if she should move her [facility] appointment. Will review with MD and follow up with pt.

## 2020-07-29 ENCOUNTER — Other Ambulatory Visit (HOSPITAL_COMMUNITY)
Admission: RE | Admit: 2020-07-29 | Discharge: 2020-07-29 | Disposition: A | Discharge status: Home / Self Care | Payer: Medicare Other | Source: Ambulatory Visit | Attending: Internal Medicine | Admitting: Internal Medicine

## 2020-07-29 DIAGNOSIS — Z20822 Contact with and (suspected) exposure to covid-19: Secondary | ICD-10-CM | POA: Insufficient documentation

## 2020-07-29 DIAGNOSIS — Z01812 Encounter for preprocedural laboratory examination: Secondary | ICD-10-CM | POA: Insufficient documentation

## 2020-07-30 ENCOUNTER — Other Ambulatory Visit: Payer: Medicare Other

## 2020-07-30 ENCOUNTER — Ambulatory Visit: Payer: Medicare Other

## 2020-07-30 LAB — SARS CORONAVIRUS 2 (TAT 6-24 HRS): SARS Coronavirus 2: NEGATIVE

## 2020-07-30 NOTE — Progress Notes (Signed)
Pre op call done for endo procedure tomorrow 07/31/20. Patient states she has been quarantined since covid test, will take her blood thinner morning of with sip of water(no missed doses) and has someone driving her home post procedure. All questions addressed.

## 2020-07-31 ENCOUNTER — Ambulatory Visit (HOSPITAL_COMMUNITY): Payer: Medicare Other | Admitting: Anesthesiology

## 2020-07-31 ENCOUNTER — Other Ambulatory Visit: Payer: Self-pay

## 2020-07-31 ENCOUNTER — Encounter (HOSPITAL_COMMUNITY): Payer: Self-pay | Admitting: Internal Medicine

## 2020-07-31 ENCOUNTER — Encounter (HOSPITAL_COMMUNITY)
Admission: RE | Disposition: A | Discharge status: Home / Self Care | Payer: Self-pay | Source: Ambulatory Visit | Attending: Internal Medicine

## 2020-07-31 ENCOUNTER — Ambulatory Visit (HOSPITAL_COMMUNITY)
Admission: RE | Admit: 2020-07-31 | Discharge: 2020-07-31 | Disposition: A | Discharge status: Home / Self Care | Payer: Medicare Other | Source: Ambulatory Visit | Attending: Internal Medicine | Admitting: Internal Medicine

## 2020-07-31 DIAGNOSIS — Z79890 Hormone replacement therapy (postmenopausal): Secondary | ICD-10-CM | POA: Diagnosis not present

## 2020-07-31 DIAGNOSIS — Z833 Family history of diabetes mellitus: Secondary | ICD-10-CM | POA: Diagnosis not present

## 2020-07-31 DIAGNOSIS — I4892 Unspecified atrial flutter: Secondary | ICD-10-CM | POA: Insufficient documentation

## 2020-07-31 DIAGNOSIS — E785 Hyperlipidemia, unspecified: Secondary | ICD-10-CM | POA: Diagnosis not present

## 2020-07-31 DIAGNOSIS — Z9049 Acquired absence of other specified parts of digestive tract: Secondary | ICD-10-CM | POA: Diagnosis not present

## 2020-07-31 DIAGNOSIS — R0602 Shortness of breath: Secondary | ICD-10-CM | POA: Diagnosis not present

## 2020-07-31 DIAGNOSIS — I495 Sick sinus syndrome: Secondary | ICD-10-CM | POA: Diagnosis not present

## 2020-07-31 DIAGNOSIS — Z7901 Long term (current) use of anticoagulants: Secondary | ICD-10-CM | POA: Insufficient documentation

## 2020-07-31 DIAGNOSIS — Z885 Allergy status to narcotic agent status: Secondary | ICD-10-CM | POA: Diagnosis not present

## 2020-07-31 DIAGNOSIS — Z6838 Body mass index (BMI) 38.0-38.9, adult: Secondary | ICD-10-CM | POA: Diagnosis not present

## 2020-07-31 DIAGNOSIS — Z79899 Other long term (current) drug therapy: Secondary | ICD-10-CM | POA: Insufficient documentation

## 2020-07-31 DIAGNOSIS — I509 Heart failure, unspecified: Secondary | ICD-10-CM | POA: Diagnosis not present

## 2020-07-31 DIAGNOSIS — Z794 Long term (current) use of insulin: Secondary | ICD-10-CM | POA: Insufficient documentation

## 2020-07-31 DIAGNOSIS — N183 Chronic kidney disease, stage 3 unspecified: Secondary | ICD-10-CM | POA: Diagnosis not present

## 2020-07-31 DIAGNOSIS — D6869 Other thrombophilia: Secondary | ICD-10-CM | POA: Diagnosis not present

## 2020-07-31 DIAGNOSIS — Z950 Presence of cardiac pacemaker: Secondary | ICD-10-CM | POA: Diagnosis not present

## 2020-07-31 DIAGNOSIS — I4819 Other persistent atrial fibrillation: Secondary | ICD-10-CM | POA: Insufficient documentation

## 2020-07-31 DIAGNOSIS — Z96653 Presence of artificial knee joint, bilateral: Secondary | ICD-10-CM | POA: Insufficient documentation

## 2020-07-31 DIAGNOSIS — Z888 Allergy status to other drugs, medicaments and biological substances status: Secondary | ICD-10-CM | POA: Insufficient documentation

## 2020-07-31 DIAGNOSIS — Z87891 Personal history of nicotine dependence: Secondary | ICD-10-CM | POA: Diagnosis not present

## 2020-07-31 DIAGNOSIS — E669 Obesity, unspecified: Secondary | ICD-10-CM | POA: Diagnosis not present

## 2020-07-31 DIAGNOSIS — E1122 Type 2 diabetes mellitus with diabetic chronic kidney disease: Secondary | ICD-10-CM | POA: Insufficient documentation

## 2020-07-31 DIAGNOSIS — I130 Hypertensive heart and chronic kidney disease with heart failure and stage 1 through stage 4 chronic kidney disease, or unspecified chronic kidney disease: Secondary | ICD-10-CM | POA: Diagnosis not present

## 2020-07-31 DIAGNOSIS — Z8249 Family history of ischemic heart disease and other diseases of the circulatory system: Secondary | ICD-10-CM | POA: Diagnosis not present

## 2020-07-31 HISTORY — PX: CARDIOVERSION: SHX1299

## 2020-07-31 SURGERY — CARDIOVERSION
Anesthesia: General

## 2020-07-31 MED ORDER — SODIUM CHLORIDE 0.9 % IV SOLN: INTRAVENOUS | Status: DC | PRN

## 2020-07-31 MED ORDER — LIDOCAINE 2% (20 MG/ML) 5 ML SYRINGE
INTRAMUSCULAR | Status: DC | PRN
  Administered 2020-07-31: 60 mg via INTRAVENOUS

## 2020-07-31 MED ORDER — PROPOFOL 10 MG/ML IV BOLUS
INTRAVENOUS | Status: DC | PRN
  Administered 2020-07-31: 60 mg via INTRAVENOUS

## 2020-07-31 NOTE — Interval H&P Note (Signed)
History and Physical Interval Note:  07/31/2020 11:24 AM  Tricia Gonzales  has presented today for surgery, with the diagnosis of A-FIB.  The various methods of treatment have been discussed with the patient and family. After consideration of risks, benefits and other options for treatment, the patient has consented to  Procedure(s): CARDIOVERSION (N/A) as a surgical intervention.  The patient's history has been reviewed, patient examined, no change in status, stable for surgery.  I have reviewed the patient's chart and labs.  Questions were answered to the patient's satisfaction.     [name] A Marcellius Montagna

## 2020-07-31 NOTE — Anesthesia Preprocedure Evaluation (Addendum)
Anesthesia Evaluation  Patient identified by MRN, date of birth, ID band Patient awake    Reviewed: Allergy & Precautions, NPO status , Patient's Chart, lab work & pertinent test results  History of Anesthesia Complications Negative for: history of anesthetic complications  Airway Mallampati: II  TM Distance: >3 FB Neck ROM: Full    Dental  (+) Dental Advisory Given   Pulmonary former smoker,    Pulmonary exam normal        Cardiovascular hypertension, Pt. on medications and Pt. on home beta blockers + dysrhythmias Atrial Fibrillation + pacemaker  Rhythm:Regular Rate:Normal   '19 TTE - EF 55% to 60%. Mild MR. RV was moderately dilated. Pacer wire or catheter noted in right ventricle. RA was moderately dilated.     Neuro/Psych PSYCHIATRIC DISORDERS Anxiety Depression negative neurological ROS     GI/Hepatic Neg liver ROS, hiatal hernia, GERD  Medicated and Controlled,  Endo/Other  diabetes, Type 2, Insulin Dependent, Oral Hypoglycemic AgentsHypothyroidism  Obesity   Renal/GU CRFRenal disease     Musculoskeletal  (+) Arthritis ,   Abdominal   Peds  Hematology  (+) anemia ,   Anesthesia Other Findings Sjogren's syndrome Covid test negative   Reproductive/Obstetrics                            Anesthesia Physical Anesthesia Plan  ASA: III  Anesthesia Plan: General   Post-op Pain Management:    Induction: Intravenous  PONV Risk Score and Plan: 3 and Treatment may vary due to age or medical condition and Propofol infusion  Airway Management Planned: Mask and Natural Airway  Additional Equipment: None  Intra-op Plan:   Post-operative Plan:   Informed Consent: I have reviewed the patients History and Physical, chart, labs and discussed the procedure including the risks, benefits and alternatives for the proposed anesthesia with the patient or authorized representative who has  indicated his/her understanding and acceptance.       Plan Discussed with: CRNA and Anesthesiologist  Anesthesia Plan Comments:        Anesthesia Quick Evaluation

## 2020-07-31 NOTE — Transfer of Care (Signed)
Immediate Anesthesia Transfer of Care Note  Patient: Tricia Gonzales  Procedure(s) Performed: CARDIOVERSION (N/A )  Patient Location: Endoscopy Unit  Anesthesia Type:MAC  Level of Consciousness: awake  Airway & Oxygen Therapy: Patient Spontanous Breathing  Post-op Assessment: Report given to RN and Post -op Vital signs reviewed and stable  Post vital signs: Reviewed and stable  Last Vitals:  Vitals Value Taken Time  BP    Temp    Pulse    Resp    SpO2      Last Pain:  Vitals:   07/31/20 1050  TempSrc: Oral  PainSc: 0-No pain         Complications: No complications documented.

## 2020-07-31 NOTE — CV Procedure (Signed)
Electrical Cardioversion Procedure Note JOZLYN SCHATZ [id number] May 03, 1941  Procedure: Electrical Cardioversion Indications:  Atrial Flutter  Time Out: Verified patient identification, verified procedure,medications/allergies/relevent history reviewed, required imaging and test results available.  Performed  Procedure Details  The patient was NPO after midnight. Anesthesia was administered at the beside  by [facility] with 12m of lidocaine and 60 mg propofol.  Cardioversion was done with synchronized biphasic defibrillation with AP pads with 200 Joules.  The patient converted to normal sinus rhythm. The patient tolerated the procedure well.  [employer] Representative present and in assistance to confirm 2:1 Flutter and successful cardioversion, confirm SR, and change to DDDR.  IMPRESSION:  Successful cardioversion of atrial fibrillation    Bodie Abernethy A Nygeria Lager 07/31/2020, 11:25 AM

## 2020-07-31 NOTE — Discharge Instructions (Signed)
Electrical Cardioversion  Electrical cardioversion is the delivery of a jolt of electricity to restore a normal rhythm to the heart. A rhythm that is too fast or is not regular keeps the heart from pumping well. In this procedure, sticky patches or metal paddles are placed on the chest to deliver electricity to the heart from a device.  What can I expect after the procedure?  Your blood pressure, heart rate, breathing rate, and blood oxygen level will be monitored until you leave the hospital or clinic.  Your heart rhythm will be watched to make sure it does not change.  You may have some redness on the skin where the shocks were given.If this occurs, can use hydrocortisone cream or Aloe vera.  Follow these instructions at home:  Do not drive for 24 hours if you were given a sedative during your procedure.  Take over-the-counter and prescription medicines only as told by your health care provider.  Ask your health care provider how to check your pulse. Check it often.  Rest for 48 hours after the procedure or as told by your health care provider.  Avoid or limit your caffeine use as told by your health care provider.  Keep all follow-up visits as told by your health care provider. This is important.  Contact a health care provider if:  You feel like your heart is beating too quickly or your pulse is not regular.  You have a serious muscle cramp that does not go away.  Get help right away if:  You have discomfort in your chest.  You are dizzy or you feel faint.  You have trouble breathing or you are short of breath.  Your speech is slurred.  You have trouble moving an arm or leg on one side of your body.  Your fingers or toes turn cold or blue.  Summary  Electrical cardioversion is the delivery of a jolt of electricity to restore a normal rhythm to the heart.  This procedure may be done right away in an emergency or may be a scheduled procedure if the condition is not  an emergency.  Generally, this is a safe procedure.  After the procedure, check your pulse often as told by your health care provider.  This information is not intended to replace advice given to you by your health care provider. Make sure you discuss any questions you have with your health care provider. Document Revised: 01/09/2019 Document Reviewed: 01/09/2019 Elsevier Patient Education  [place].

## 2020-07-31 NOTE — Anesthesia Postprocedure Evaluation (Signed)
Anesthesia Post Note  Patient: Tricia Gonzales  Procedure(s) Performed: CARDIOVERSION (N/A )     Patient location during evaluation: PACU Anesthesia Type: General Level of consciousness: awake and alert Pain management: pain level controlled Vital Signs Assessment: post-procedure vital signs reviewed and stable Respiratory status: spontaneous breathing, nonlabored ventilation and respiratory function stable Cardiovascular status: blood pressure returned to baseline and stable Postop Assessment: no apparent nausea or vomiting Anesthetic complications: no   No complications documented.  Last Vitals:  Vitals:   07/31/20 1137 07/31/20 1150  BP: (!) 128/56 (!) 129/93  Pulse: (!) 59 (!) 59  Resp: 16 13  Temp:    SpO2: 95% 97%    Last Pain:  Vitals:   07/31/20 1150  TempSrc:   PainSc: 0-No pain                 Audry Pili

## 2020-08-01 ENCOUNTER — Other Ambulatory Visit: Payer: Self-pay

## 2020-08-01 DIAGNOSIS — E875 Hyperkalemia: Secondary | ICD-10-CM

## 2020-08-01 LAB — POCT I-STAT, CHEM 8
BUN: 83 mg/dL — ABNORMAL HIGH (ref 8–23)
Calcium, Ion: 1.18 mmol/L (ref 1.15–1.40)
Chloride: 102 mmol/L (ref 98–111)
Creatinine, Ser: 2 mg/dL — ABNORMAL HIGH (ref 0.44–1.00)
Glucose, Bld: 114 mg/dL — ABNORMAL HIGH (ref 70–99)
HCT: 31 % — ABNORMAL LOW (ref 36.0–46.0)
Hemoglobin: 10.5 g/dL — ABNORMAL LOW (ref 12.0–15.0)
Potassium: 5.2 mmol/L — ABNORMAL HIGH (ref 3.5–5.1)
Sodium: 138 mmol/L (ref 135–145)
TCO2: 27 mmol/L (ref 22–32)

## 2020-08-02 ENCOUNTER — Other Ambulatory Visit: Payer: Medicare Other

## 2020-08-02 ENCOUNTER — Ambulatory Visit: Payer: Medicare Other

## 2020-08-04 ENCOUNTER — Encounter (HOSPITAL_COMMUNITY): Payer: Self-pay | Admitting: Internal Medicine

## 2020-08-05 ENCOUNTER — Other Ambulatory Visit: Payer: Self-pay

## 2020-08-05 DIAGNOSIS — E875 Hyperkalemia: Secondary | ICD-10-CM

## 2020-08-06 LAB — BASIC METABOLIC PANEL
BUN/Creatinine Ratio: 48 — ABNORMAL HIGH (ref 12–28)
BUN: 106 mg/dL (ref 8–27)
CO2: 23 mmol/L (ref 20–29)
Calcium: 8.8 mg/dL (ref 8.7–10.3)
Chloride: 102 mmol/L (ref 96–106)
Creatinine, Ser: 2.19 mg/dL — ABNORMAL HIGH (ref 0.57–1.00)
GFR calc Af Amer: 24 mL/min/{1.73_m2} — ABNORMAL LOW (ref >59)
GFR calc non Af Amer: 21 mL/min/{1.73_m2} — ABNORMAL LOW (ref >59)
Glucose: 159 mg/dL — ABNORMAL HIGH (ref 65–99)
Potassium: 4.4 mmol/L (ref 3.5–5.2)
Sodium: 142 mmol/L (ref 134–144)

## 2020-08-07 ENCOUNTER — Telehealth: Payer: Self-pay | Admitting: Cardiovascular Disease

## 2020-08-07 NOTE — Telephone Encounter (Signed)
I am really concerned about her uremia.  Her recent kidney panel shows she possibly is uremic.  Has she been set up to see nephrology?  I will see her on the 24th we will check an EKG.  I would recommend she continue medications as we discussed.  The major change that I am seeing is a significant decline in her kidney function.  If she continues to do poorly she should proceed to the emergency room.  [place] T. Audie Box, MD, [place]  [address], [place], [place] [address] [phone]  3:17 PM

## 2020-08-07 NOTE — Telephone Encounter (Signed)
Called patient back, advised of message below-  We did discuss during previous call to contact her nephrologist, patient advised she had not done this yet- but during this call I did encourage her to do so, and to proceed to ED if she continues to worsen. Patient did verbalize understanding of this.  Thanks!

## 2020-08-07 NOTE — Telephone Encounter (Signed)
Called patient back- she states that she has been noticing she is way more tired recently, and falls asleep during the day. She can sleep at night, and during the day and has no energy to do anything. When asked how long this has been going on, she states that it has been going on since the medication change at the last visit- so she is unsure if this is related. Patient was placed on amiodarone. She does not have any other symptoms other than being tired and sleeping a lot.   Also advised patient that PCP is in our care everywhere and I thought they could also see our notes- but we can send information over to her when needed. Patient verbalized understanding, will await any recommendations from MD from above concerns.

## 2020-08-07 NOTE — Telephone Encounter (Signed)
Patient states she is sleeping all the time and wants to know if this is related to CHF. She states she also wants to make sure her resports are being sent to her PCP.

## 2020-08-09 ENCOUNTER — Other Ambulatory Visit: Payer: Self-pay

## 2020-08-09 ENCOUNTER — Ambulatory Visit (HOSPITAL_COMMUNITY): Payer: Medicare Other | Attending: Internal Medicine

## 2020-08-09 DIAGNOSIS — R0602 Shortness of breath: Secondary | ICD-10-CM | POA: Diagnosis present

## 2020-08-09 LAB — ECHOCARDIOGRAM COMPLETE
Area-P 1/2: 3.53 cm2
MV M vel: 5.31 m/s
MV Peak grad: 112.8 mmHg
S' Lateral: 3.7 cm

## 2020-08-12 ENCOUNTER — Other Ambulatory Visit: Payer: Medicare Other

## 2020-08-12 ENCOUNTER — Ambulatory Visit: Payer: Medicare Other

## 2020-08-14 NOTE — Progress Notes (Unsigned)
Cardiology Office Note:   Date:  08/15/2020  NAME:  Tricia Gonzales    MRN: [id number] DOB:  September 07, 1940   PCP:  Carol Ada, MD  Cardiologist:  No primary care provider on file.  Electrophysiologist:  Cristopher Peru, MD   Referring MD: Carol Ada, MD   Chief Complaint  Patient presents with  . Follow-up    4 weeks.  Marland Kitchen Headache  . Shortness of Breath   History of Present Illness:   Tricia Gonzales is a 80 y.o. female with a hx of paroxysmal Afib, CKD, SSS s/p ppm, DM HTN who presents for follow-up. Seen a few weeks ago for recurrent Afib. Underwent DCCV. BNP 1,000. Echo shows perserved EF but volume overload. She reports she is not feeling any better.  Weights are stable.  She has 2+ pitting edema.  Most recent echocardiogram shows she is volume overloaded with elevated pulmonary pressures and a noncollapsing IVC.  She has 2+ pitting edema up to her knees.  She reports she short of breath with walking.  She is also nauseated.  I have concerns about her BUN that was rising.  EKG in office demonstrates AV paced rhythm.  BP 140/78.  Pulse 73.  Weights are still stable around 184.  She is not losing weight.  Given that her symptoms are not improving.  She still short of breath and volume overloaded I have recommended hospital admission.  She is okay to do this.  She reports she is not interested in dialysis and would like to try medications.  I do agree with this.  We will honor this.  Problem List 1. Paroxysmal Afib -CHADSVASC=5 -DCCV 07/31/2020 2. Junctional bradycardia s/p ppm 2016 3. DM -A1c 6.5 4. HTN 5. CKD -1.96 6. HLD -T chol 151, HDL 44, LDL 84, TG 131 7. Anemia -Hgb 9.2  Past Medical History: Past Medical History:  Diagnosis Date  . Anemia   . 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 Anxiety   . Arthritis   . Atrial fibrillation ([place])   . Avascular necrosis (HCC) hip left   leg pain also  . Chronic back pain   . Chronic kidney disease (CKD), stage III (moderate)  (HCC)   . Depression   . Diverticulosis   . Early cataracts, bilateral   . Esophageal stricture   . Gastritis   . GERD (gastroesophageal reflux disease)   . Glaucoma   . Heart murmur     " some doctors say that I have one some say that I don"t "  . Hiatal hernia   . History of blood transfusion    "@ least w/1st knee OR"  . History of gout   . History of kidney stones   . Hyperlipidemia   . Hypertension   . Intestinal obstruction ([place])   . Kidney stones   . Obesity   . Osteoarthritis   . Osteoporosis   . Pericarditis 2016  . Presence of permanent cardiac pacemaker   . Sjogren's disease ([place])   . Thyroid disease   . Type II diabetes mellitus ([place])   . Vitamin B12 deficiency     Past Surgical History: Past Surgical History:  Procedure Laterality Date  . ABDOMINAL HYSTERECTOMY     complete  . CARDIOVERSION N/A 07/31/2020   Procedure: CARDIOVERSION;  Surgeon: Werner Lean, MD;  Location: [facility];  Service: Cardiovascular;  Laterality: N/A;  . CHOLECYSTECTOMY OPEN    . COLECTOMY     for rectovaginal  fistula  . COLONOSCOPY  01/07/2012   Procedure: COLONOSCOPY;  Surgeon: Lafayette Dragon, MD;  Location: [facility];  Service: Endoscopy;  Laterality: N/A;  . EP IMPLANTABLE DEVICE N/A 06/18/2015   Procedure: Pacemaker Implant;  Surgeon: Evans Lance, MD;  Location: [place] [facility];  Service: Cardiovascular;  Laterality: N/A;  . ESOPHAGOGASTRODUODENOSCOPY N/A 11/24/2018   Procedure: ESOPHAGOGASTRODUODENOSCOPY (EGD);  Surgeon: Yetta Flock, MD;  Location: [facility];  Service: Gastroenterology;  Laterality: N/A;  . ESOPHAGOGASTRODUODENOSCOPY (EGD) WITH ESOPHAGEAL DILATION    . ESOPHAGOGASTRODUODENOSCOPY (EGD) WITH PROPOFOL N/A 08/12/2015   Procedure: ESOPHAGOGASTRODUODENOSCOPY (EGD) WITH PROPOFOL;  Surgeon: Manus Gunning, MD;  Location: [facility];  Service: Gastroenterology;  Laterality: N/A;  . EYE SURGERY Bilateral    cataracts  . HOT  HEMOSTASIS N/A 11/24/2018   Procedure: HOT HEMOSTASIS (ARGON PLASMA COAGULATION/BICAP);  Surgeon: Yetta Flock, MD;  Location: [facility];  Service: Gastroenterology;  Laterality: N/A;  . I & D EXTREMITY Right 08/06/2016   Procedure: DEBRIDEMENT PIP RIGHT RING FINGER;  Surgeon: Daryll Brod, MD;  Location: [place];  Service: Orthopedics;  Laterality: Right;  . INSERT / REPLACE / REMOVE PACEMAKER    . JOINT REPLACEMENT    . KNEE ARTHROSCOPY Right   . MASS EXCISION Right 08/06/2016   Procedure: EXCISION CYST;  Surgeon: Daryll Brod, MD;  Location: [facility];  Service: Orthopedics;  Laterality: Right;  . REVISION TOTAL KNEE ARTHROPLASTY Left   . TOTAL KNEE ARTHROPLASTY Bilateral     Current Medications: Current Meds  Medication Sig  . ACCU-CHEK AVIVA PLUS test strip 1 each by Other route daily.   Marland Kitchen acetaminophen (TYLENOL) 650 MG CR tablet Take 1,300 mg by mouth at bedtime.  Marland Kitchen amiodarone (PACERONE) 200 MG tablet Take 1 tablet (200 mg total) by mouth daily.  . BD PEN NEEDLE NANO U/F 32G X 4 MM MISC See admin instructions.  . carvedilol (COREG) 3.125 MG tablet Take 2 tablets in the AM and 1 tablet in the PM (Patient taking differently: Take 3.125-6.25 mg by mouth See admin instructions. Take 6.25 mg in the morning and 3.125 mg at bedtime)  . Cholecalciferol (VITAMIN D3) 25 MCG (1000 UT) CAPS Take 1,000 Units by mouth daily.  . dorzolamide (TRUSOPT) 2 % ophthalmic solution Place 1 drop into the right eye 2 (two) times daily.  Marland Kitchen ELIQUIS 5 MG TABS tablet TAKE ONE TABLET TWICE DAILY (Patient taking differently: Take 5 mg by mouth 2 (two) times daily.)  . febuxostat (ULORIC) 40 MG tablet Take 1 tablet (40 mg total) by mouth every Monday, Wednesday, and Friday.  . ferrous sulfate 325 (65 FE) MG EC tablet Take 1 tablet (325 mg total) by mouth 2 (two) times daily.  . furosemide (LASIX) 40 MG tablet Take 1 tablet (40 mg total) by mouth daily.  Marland Kitchen gabapentin (NEURONTIN) 300 MG capsule Take 1 capsule (300 mg  total) by mouth daily.  Marland Kitchen gabapentin (NEURONTIN) 400 MG capsule Take 1 capsule (400 mg total) by mouth at bedtime.  Marland Kitchen JANUVIA 50 MG tablet Take 1 tablet (50 mg total) by mouth daily. (Patient taking differently: Take 25 mg by mouth daily.)  . levothyroxine (SYNTHROID) 50 MCG tablet Take 1 tablet (50 mcg total) by mouth at bedtime.  . montelukast (SINGULAIR) 10 MG tablet Take 1 tablet (10 mg total) by mouth at bedtime.  Marland Kitchen nystatin (NYAMYC) powder APPLY TOPICALLY TO LEFT ABDOMINAL FOLD AND GROIN TWICE A DAY (Patient taking differently: Apply 1 application topically daily  as needed (rash). LEFT ABDOMINAL FOLD AND GROIN)  . pantoprazole (PROTONIX) 40 MG tablet Take 1 tablet (40 mg total) by mouth daily.  . pentoxifylline (TRENTAL) 400 MG CR tablet Take 1 tablet (400 mg total) by mouth daily.  . Polyethylene Glycol 3350 (MIRALAX PO) Take 17 g by mouth daily.  . Probiotic Product (PROBIOTIC DAILY PO) Take 1 tablet by mouth daily.  Marland Kitchen rOPINIRole (REQUIP) 0.5 MG tablet Take 0.5 mg by mouth at bedtime.  . rosuvastatin (CRESTOR) 5 MG tablet Take 1 tablet (5 mg total) by mouth daily.  Marland Kitchen senna-docusate (SENOKOT-S) 8.6-50 MG tablet Take 1 tablet by mouth at bedtime.  . traMADol (ULTRAM) 50 MG tablet Take 50 mg by mouth at bedtime.  Tyler Aas FLEXTOUCH 100 UNIT/ML FlexTouch Pen Inject 8 Units into the skin daily.     Allergies:    Norvasc [amlodipine besylate], Avapro [irbesartan], Glucophage [metformin hydrochloride], Lisinopril, Losartan, Morphine and related, Simvastatin, Trulicity [dulaglutide], and Fiber   Social History: Social History   Socioeconomic History  . Marital status: Divorced    Spouse name: Not on file  . Number of children: 2  . Years of education: Not on file  . Highest education level: Not on file  Occupational History  . Occupation: retired  Tobacco Use  . Smoking status: Former Smoker    Packs/day: 2.00    Years: 3.00    Pack years: 6.00    Types: Cigarettes    Quit date:  06/23/1983    Years since quitting: 37.1  . Smokeless tobacco: Never Used  Vaping Use  . Vaping Use: Never used  Substance and Sexual Activity  . Alcohol use: Yes    Alcohol/week: 2.0 standard drinks    Types: 2 Glasses of wine per week    Comment: occ  . Drug use: No  . Sexual activity: Never  Other Topics Concern  . Not on file  Social History Narrative  . Not on file   Social Determinants of Health   Financial Resource Strain: Not on file  Food Insecurity: Not on file  Transportation Needs: Not on file  Physical Activity: Not on file  Stress: Not on file  Social Connections: Not on file     Family History: The patient's family history includes Diabetes in her brother and mother; Hypertension in her sister. There is no history of Colon cancer, Heart attack, Stroke, Colon polyps, Esophageal cancer, Rectal cancer, or Stomach cancer.  ROS:   All other ROS reviewed and negative. Pertinent positives noted in the HPI.     EKGs/Labs/Other Studies Reviewed:   The following studies were personally reviewed by me today:  EKG:  EKG is ordered today.  The ekg ordered today demonstrates AV paced rhythm, and was personally reviewed by me.   TTE 08/09/2020 1. Left ventricular ejection fraction, by estimation, is 55 to 60%. The  left ventricle has normal function. The left ventricle has no regional  wall motion abnormalities. The left ventricular internal cavity size was  moderately dilated. Left ventricular  diastolic parameters are indeterminate.  2. Right ventricular systolic function is normal. The right ventricular  size is moderately enlarged. There is severely elevated pulmonary artery  systolic pressure. The estimated right ventricular systolic pressure is  12.1 mmHg.  3. Left atrial size was severely dilated.  4. Right atrial size was mildly dilated.  5. The mitral valve is abnormal. Moderate to severe mitral valve  regurgitation.   Predominant mechanism appears to  be atrial function  and ventricular  dilation in nature. No evidence of mitral stenosis. Moderate mitral  annular calcification.  6. Tricuspid valve regurgitation is moderate.  7. The aortic valve is tricuspid. There is mild thickening of the aortic  valve. Aortic valve regurgitation is not visualized. No aortic stenosis is  present.  8. The inferior vena cava is dilated in size with <50% respiratory  variability, suggesting right atrial pressure of 15 mmHg.   Recent Labs: 07/19/2020: BNP 1,025.7; Platelets 257 07/31/2020: Hemoglobin 10.5 08/05/2020: BUN 106; Creatinine, Ser 2.19; Potassium 4.4; Sodium 142   Recent Lipid Panel No results found for: CHOL, TRIG, HDL, CHOLHDL, VLDL, LDLCALC, LDLDIRECT  Physical Exam:   VS:  BP 140/78 (BP Location: Left Arm, Patient Position: Sitting, Cuff Size: Normal)   Pulse 73   Ht _0  (1.473 m)   Wt 184 lb (83.5 kg)   BMI 38.46 kg/m    Wt Readings from Last 3 Encounters:  08/15/20 184 lb (83.5 kg)  07/19/20 184 lb 9.6 oz (83.7 kg)  07/11/20 183 lb 12.8 oz (83.4 kg)    General: Well nourished, well developed, in no acute distress Head: Atraumatic, normal size  Eyes: PEERLA, EOMI  Neck: Supple, JVD 10 to 12 cm of water Endocrine: No thryomegaly Cardiac: Normal S1, S2; RRR; no murmurs, rubs, or gallops Lungs: Crackles at the lung bases Abd: Soft, nontender, no hepatomegaly  Ext: 2+ pitting edema up to knees Musculoskeletal: No deformities, BUE and BLE strength normal and equal Skin: Warm and dry, no rashes   Neuro: Alert and oriented to person, place, time, and situation, CNII-XII grossly intact, no focal deficits  Psych: Normal mood and affect   ASSESSMENT:   Tricia Gonzales is a 80 y.o. female who presents for the following: 1. Chronic diastolic heart failure ([place])   2. SOB (shortness of breath) on exertion   3. AKI (acute kidney injury) ([place])   4. Persistent atrial fibrillation (HCC)   5. Stage 3b chronic kidney disease ([place])      PLAN:   1. Chronic diastolic heart failure ([place]) 2. SOB (shortness of breath) on exertion 3. AKI (acute kidney injury) ([place]) -She presents with worsening congestive heart failure.  She is volume overloaded.  BNP 1000.  Weights are not going down despite oral diuretic therapy.  She is short of breath with exertion.  She was noted to be uremic with a BUN of 106.  I think we need to directly admit her for IV diuresis.  We discussed that volume we will is paramount.  She reports she is not interested in hemodialysis.  I do not know if we need that yet.  We need to see how she will do with diuretic therapy. -When she arrives in the hospital she will need full panel of labs including CBC, BMP, BNP.  She will need a chest x-ray and start on IV diuretic therapy.  4. Persistent atrial fibrillation (HCC) -Maintaining sinus rhythm.  Continue amiodarone for rhythm control.  She is on Eliquis anticoagulation.  Continue Coreg as well.  5. Stage 3b chronic kidney disease ([place]) -Kidney function has declined.  Admission to hospital as above.  Disposition: Return in about 2 weeks (around 08/29/2020).  Medication Adjustments/Labs and Tests Ordered: Current medicines are reviewed at length with the patient today.  Concerns regarding medicines are outlined above.  Orders Placed This Encounter  Procedures  . EKG 12-Lead   No orders of the defined types were placed in this encounter.   Patient  Instructions  Medication Instructions:  The current medical regimen is effective;  continue present plan and medications.  *If you need a refill on your cardiac medications before your next appointment, please call your pharmacy*   Follow-Up: At [facility], you and your health needs are our priority.  As part of our continuing mission to provide you with exceptional heart care, we have created designated Provider Care Teams.  These Care Teams include your primary Cardiologist (physician) and Advanced Practice  Providers (APPs -  Physician Assistants and Nurse Practitioners) who all work together to provide you with the care you need, when you need it.  We recommend signing up for the patient portal called "MyChart".  Sign up information is provided on this After Visit Summary.  MyChart is used to connect with patients for Virtual Visits (Telemedicine).  Patients are able to view lab/test results, encounter notes, upcoming appointments, etc.  Non-urgent messages can be sent to your provider as well.   To learn more about what you can do with MyChart, go to [email or web address].    Your next appointment:   2 week(s)  The format for your next appointment:   In Person  Provider:   Eleonore Chiquito, MD        Time Spent with Patient: I have spent a total of 35 minutes with patient reviewing hospital notes, telemetry, EKGs, labs and examining the patient as well as establishing an assessment and plan that was discussed with the patient.  > 50% of time was spent in direct patient care.  Signed, Addison Naegeli. Audie Box, [place]  [address], [place], [place] [address] [phone]  08/15/2020 3:03 PM

## 2020-08-15 ENCOUNTER — Inpatient Hospital Stay (HOSPITAL_COMMUNITY): Payer: Medicare Other

## 2020-08-15 ENCOUNTER — Inpatient Hospital Stay (HOSPITAL_COMMUNITY)
Admission: AD | Admit: 2020-08-15 | Discharge: 2020-08-20 | DRG: 291 | Disposition: A | Discharge status: Home / Self Care | Payer: Medicare Other | Source: Ambulatory Visit | Attending: Internal Medicine | Admitting: Internal Medicine

## 2020-08-15 ENCOUNTER — Encounter: Payer: Self-pay | Admitting: Cardiovascular Disease

## 2020-08-15 ENCOUNTER — Ambulatory Visit (INDEPENDENT_AMBULATORY_CARE_PROVIDER_SITE_OTHER): Payer: Medicare Other | Admitting: Cardiovascular Disease

## 2020-08-15 ENCOUNTER — Other Ambulatory Visit: Payer: Self-pay

## 2020-08-15 VITALS — BP 140/78 | HR 73 | Ht <= 58 in | Wt 184.0 lb

## 2020-08-15 DIAGNOSIS — Z7901 Long term (current) use of anticoagulants: Secondary | ICD-10-CM

## 2020-08-15 DIAGNOSIS — I130 Hypertensive heart and chronic kidney disease with heart failure and stage 1 through stage 4 chronic kidney disease, or unspecified chronic kidney disease: Secondary | ICD-10-CM | POA: Diagnosis present

## 2020-08-15 DIAGNOSIS — Z8249 Family history of ischemic heart disease and other diseases of the circulatory system: Secondary | ICD-10-CM

## 2020-08-15 DIAGNOSIS — N179 Acute kidney failure, unspecified: Secondary | ICD-10-CM | POA: Diagnosis present

## 2020-08-15 DIAGNOSIS — N189 Chronic kidney disease, unspecified: Secondary | ICD-10-CM | POA: Diagnosis not present

## 2020-08-15 DIAGNOSIS — E669 Obesity, unspecified: Secondary | ICD-10-CM | POA: Diagnosis present

## 2020-08-15 DIAGNOSIS — Z950 Presence of cardiac pacemaker: Secondary | ICD-10-CM | POA: Diagnosis present

## 2020-08-15 DIAGNOSIS — R0602 Shortness of breath: Secondary | ICD-10-CM | POA: Diagnosis not present

## 2020-08-15 DIAGNOSIS — K219 Gastro-esophageal reflux disease without esophagitis: Secondary | ICD-10-CM | POA: Diagnosis present

## 2020-08-15 DIAGNOSIS — M549 Dorsalgia, unspecified: Secondary | ICD-10-CM | POA: Diagnosis present

## 2020-08-15 DIAGNOSIS — I4819 Other persistent atrial fibrillation: Secondary | ICD-10-CM | POA: Diagnosis present

## 2020-08-15 DIAGNOSIS — I5032 Chronic diastolic (congestive) heart failure: Secondary | ICD-10-CM

## 2020-08-15 DIAGNOSIS — I5031 Acute diastolic (congestive) heart failure: Secondary | ICD-10-CM | POA: Diagnosis not present

## 2020-08-15 DIAGNOSIS — E039 Hypothyroidism, unspecified: Secondary | ICD-10-CM | POA: Diagnosis present

## 2020-08-15 DIAGNOSIS — I272 Pulmonary hypertension, unspecified: Secondary | ICD-10-CM | POA: Diagnosis present

## 2020-08-15 DIAGNOSIS — F419 Anxiety disorder, unspecified: Secondary | ICD-10-CM | POA: Diagnosis present

## 2020-08-15 DIAGNOSIS — M810 Age-related osteoporosis without current pathological fracture: Secondary | ICD-10-CM | POA: Diagnosis present

## 2020-08-15 DIAGNOSIS — Z9049 Acquired absence of other specified parts of digestive tract: Secondary | ICD-10-CM

## 2020-08-15 DIAGNOSIS — N183 Chronic kidney disease, stage 3 unspecified: Secondary | ICD-10-CM | POA: Diagnosis present

## 2020-08-15 DIAGNOSIS — Z87891 Personal history of nicotine dependence: Secondary | ICD-10-CM

## 2020-08-15 DIAGNOSIS — Z87442 Personal history of urinary calculi: Secondary | ICD-10-CM

## 2020-08-15 DIAGNOSIS — E1136 Type 2 diabetes mellitus with diabetic cataract: Secondary | ICD-10-CM | POA: Diagnosis present

## 2020-08-15 DIAGNOSIS — K222 Esophageal obstruction: Secondary | ICD-10-CM | POA: Diagnosis not present

## 2020-08-15 DIAGNOSIS — D649 Anemia, unspecified: Secondary | ICD-10-CM

## 2020-08-15 DIAGNOSIS — Z20822 Contact with and (suspected) exposure to covid-19: Secondary | ICD-10-CM | POA: Diagnosis present

## 2020-08-15 DIAGNOSIS — E1142 Type 2 diabetes mellitus with diabetic polyneuropathy: Secondary | ICD-10-CM | POA: Diagnosis not present

## 2020-08-15 DIAGNOSIS — G8929 Other chronic pain: Secondary | ICD-10-CM | POA: Diagnosis present

## 2020-08-15 DIAGNOSIS — N1832 Chronic kidney disease, stage 3b: Secondary | ICD-10-CM | POA: Diagnosis present

## 2020-08-15 DIAGNOSIS — K31819 Angiodysplasia of stomach and duodenum without bleeding: Secondary | ICD-10-CM | POA: Diagnosis present

## 2020-08-15 DIAGNOSIS — I5033 Acute on chronic diastolic (congestive) heart failure: Secondary | ICD-10-CM | POA: Diagnosis present

## 2020-08-15 DIAGNOSIS — R001 Bradycardia, unspecified: Secondary | ICD-10-CM | POA: Diagnosis present

## 2020-08-15 DIAGNOSIS — Z885 Allergy status to narcotic agent status: Secondary | ICD-10-CM

## 2020-08-15 DIAGNOSIS — D509 Iron deficiency anemia, unspecified: Secondary | ICD-10-CM | POA: Diagnosis present

## 2020-08-15 DIAGNOSIS — H409 Unspecified glaucoma: Secondary | ICD-10-CM | POA: Diagnosis present

## 2020-08-15 DIAGNOSIS — I509 Heart failure, unspecified: Secondary | ICD-10-CM | POA: Diagnosis not present

## 2020-08-15 DIAGNOSIS — Z6838 Body mass index (BMI) 38.0-38.9, adult: Secondary | ICD-10-CM

## 2020-08-15 DIAGNOSIS — K449 Diaphragmatic hernia without obstruction or gangrene: Secondary | ICD-10-CM | POA: Diagnosis present

## 2020-08-15 DIAGNOSIS — Z7984 Long term (current) use of oral hypoglycemic drugs: Secondary | ICD-10-CM

## 2020-08-15 DIAGNOSIS — R1310 Dysphagia, unspecified: Secondary | ICD-10-CM | POA: Diagnosis not present

## 2020-08-15 DIAGNOSIS — Z79899 Other long term (current) drug therapy: Secondary | ICD-10-CM

## 2020-08-15 DIAGNOSIS — F32A Depression, unspecified: Secondary | ICD-10-CM | POA: Diagnosis present

## 2020-08-15 DIAGNOSIS — Z79890 Hormone replacement therapy (postmenopausal): Secondary | ICD-10-CM

## 2020-08-15 DIAGNOSIS — M109 Gout, unspecified: Secondary | ICD-10-CM | POA: Diagnosis present

## 2020-08-15 DIAGNOSIS — E785 Hyperlipidemia, unspecified: Secondary | ICD-10-CM | POA: Diagnosis present

## 2020-08-15 DIAGNOSIS — D631 Anemia in chronic kidney disease: Secondary | ICD-10-CM | POA: Diagnosis present

## 2020-08-15 DIAGNOSIS — E1122 Type 2 diabetes mellitus with diabetic chronic kidney disease: Secondary | ICD-10-CM | POA: Diagnosis present

## 2020-08-15 DIAGNOSIS — Z888 Allergy status to other drugs, medicaments and biological substances status: Secondary | ICD-10-CM

## 2020-08-15 DIAGNOSIS — N184 Chronic kidney disease, stage 4 (severe): Secondary | ICD-10-CM

## 2020-08-15 DIAGNOSIS — Z833 Family history of diabetes mellitus: Secondary | ICD-10-CM

## 2020-08-15 DIAGNOSIS — Z96653 Presence of artificial knee joint, bilateral: Secondary | ICD-10-CM | POA: Diagnosis present

## 2020-08-15 DIAGNOSIS — D62 Acute posthemorrhagic anemia: Secondary | ICD-10-CM

## 2020-08-15 DIAGNOSIS — E1165 Type 2 diabetes mellitus with hyperglycemia: Secondary | ICD-10-CM | POA: Diagnosis not present

## 2020-08-15 DIAGNOSIS — E119 Type 2 diabetes mellitus without complications: Secondary | ICD-10-CM

## 2020-08-15 LAB — BASIC METABOLIC PANEL
Anion gap: 8 (ref 5–15)
BUN: 83 mg/dL — ABNORMAL HIGH (ref 8–23)
CO2: 27 mmol/L (ref 22–32)
Calcium: 9 mg/dL (ref 8.9–10.3)
Chloride: 102 mmol/L (ref 98–111)
Creatinine, Ser: 2.18 mg/dL — ABNORMAL HIGH (ref 0.44–1.00)
GFR, Estimated: 22 mL/min — ABNORMAL LOW (ref >60)
Glucose, Bld: 270 mg/dL — ABNORMAL HIGH (ref 70–99)
Potassium: 5.4 mmol/L — ABNORMAL HIGH (ref 3.5–5.1)
Sodium: 137 mmol/L (ref 135–145)

## 2020-08-15 LAB — HEPATIC FUNCTION PANEL
ALT: 10 U/L (ref 0–44)
AST: 14 U/L — ABNORMAL LOW (ref 15–41)
Albumin: 3.1 g/dL — ABNORMAL LOW (ref 3.5–5.0)
Alkaline Phosphatase: 68 U/L (ref 38–126)
Bilirubin, Direct: 0.1 mg/dL (ref 0.0–0.2)
Indirect Bilirubin: 0.3 mg/dL (ref 0.3–0.9)
Total Bilirubin: 0.4 mg/dL (ref 0.3–1.2)
Total Protein: 5.5 g/dL — ABNORMAL LOW (ref 6.5–8.1)

## 2020-08-15 LAB — CBC WITH DIFFERENTIAL/PLATELET
Abs Immature Granulocytes: 0.06 10*3/uL (ref 0.00–0.07)
Basophils Absolute: 0 10*3/uL (ref 0.0–0.1)
Basophils Relative: 0 %
Eosinophils Absolute: 0.1 10*3/uL (ref 0.0–0.5)
Eosinophils Relative: 1 %
HCT: 20.9 % — ABNORMAL LOW (ref 36.0–46.0)
Hemoglobin: 6.3 g/dL — CL (ref 12.0–15.0)
Immature Granulocytes: 1 %
Lymphocytes Relative: 7 %
Lymphs Abs: 0.5 10*3/uL — ABNORMAL LOW (ref 0.7–4.0)
MCH: 31.2 pg (ref 26.0–34.0)
MCHC: 30.1 g/dL (ref 30.0–36.0)
MCV: 103.5 fL — ABNORMAL HIGH (ref 80.0–100.0)
Monocytes Absolute: 0.6 10*3/uL (ref 0.1–1.0)
Monocytes Relative: 8 %
Neutro Abs: 6.7 10*3/uL (ref 1.7–7.7)
Neutrophils Relative %: 83 %
Platelets: 336 10*3/uL (ref 150–400)
RBC: 2.02 MIL/uL — ABNORMAL LOW (ref 3.87–5.11)
RDW: 18.2 % — ABNORMAL HIGH (ref 11.5–15.5)
WBC: 8 10*3/uL (ref 4.0–10.5)
nRBC: 0 % (ref 0.0–0.2)

## 2020-08-15 LAB — GLUCOSE, CAPILLARY: Glucose-Capillary: 258 mg/dL — ABNORMAL HIGH (ref 70–99)

## 2020-08-15 LAB — HEMOGLOBIN A1C
Hgb A1c MFr Bld: 6 % — ABNORMAL HIGH (ref 4.8–5.6)
Mean Plasma Glucose: 125.5 mg/dL

## 2020-08-15 LAB — PREPARE RBC (CROSSMATCH)

## 2020-08-15 LAB — BRAIN NATRIURETIC PEPTIDE: B Natriuretic Peptide: 1245.9 pg/mL — ABNORMAL HIGH (ref 0.0–100.0)

## 2020-08-15 LAB — TSH: TSH: 5.867 u[IU]/mL — ABNORMAL HIGH (ref 0.350–4.500)

## 2020-08-15 MED ORDER — ACETAMINOPHEN 325 MG PO TABS
650.0000 mg | ORAL_TABLET | ORAL | Status: DC | PRN
  Administered 2020-08-16 – 2020-08-19 (×9): 650 mg via ORAL
  Filled 2020-08-15 (×9): qty 2

## 2020-08-15 MED ORDER — PENTOXIFYLLINE ER 400 MG PO TBCR
400.0000 mg | EXTENDED_RELEASE_TABLET | Freq: Every day | ORAL | Status: DC
  Administered 2020-08-16 – 2020-08-20 (×5): 400 mg via ORAL
  Filled 2020-08-15 (×6): qty 1

## 2020-08-15 MED ORDER — FUROSEMIDE 10 MG/ML IJ SOLN
80.0000 mg | Freq: Two times a day (BID) | INTRAMUSCULAR | Status: DC
  Administered 2020-08-15 – 2020-08-17 (×4): 80 mg via INTRAVENOUS
  Filled 2020-08-15 (×4): qty 8

## 2020-08-15 MED ORDER — POLYETHYLENE GLYCOL 3350 17 G PO PACK
17.0000 g | PACK | Freq: Every day | ORAL | Status: DC
  Administered 2020-08-16 – 2020-08-18 (×3): 17 g via ORAL
  Filled 2020-08-15 (×5): qty 1

## 2020-08-15 MED ORDER — SODIUM CHLORIDE 0.9% FLUSH
3.0000 mL | Freq: Two times a day (BID) | INTRAVENOUS | Status: DC
  Administered 2020-08-15 – 2020-08-20 (×10): 3 mL via INTRAVENOUS

## 2020-08-15 MED ORDER — RISAQUAD PO CAPS
1.0000 | ORAL_CAPSULE | Freq: Every day | ORAL | Status: DC
  Administered 2020-08-16 – 2020-08-20 (×5): 1 via ORAL
  Filled 2020-08-15 (×5): qty 1

## 2020-08-15 MED ORDER — NITROGLYCERIN 0.4 MG SL SUBL: 0.4000 mg | SUBLINGUAL_TABLET | SUBLINGUAL | Status: DC | PRN

## 2020-08-15 MED ORDER — SODIUM CHLORIDE 0.9% FLUSH: 3.0000 mL | INTRAVENOUS | Status: DC | PRN

## 2020-08-15 MED ORDER — GABAPENTIN 300 MG PO CAPS
300.0000 mg | ORAL_CAPSULE | Freq: Every day | ORAL | Status: DC
Start: 2020-08-16 — End: 2020-08-20
  Administered 2020-08-16 – 2020-08-20 (×5): 300 mg via ORAL
  Filled 2020-08-15 (×5): qty 1

## 2020-08-15 MED ORDER — AMIODARONE HCL 200 MG PO TABS
200.0000 mg | ORAL_TABLET | Freq: Every day | ORAL | Status: DC
  Administered 2020-08-15 – 2020-08-20 (×6): 200 mg via ORAL
  Filled 2020-08-15 (×6): qty 1

## 2020-08-15 MED ORDER — ZOLPIDEM TARTRATE 5 MG PO TABS
5.0000 mg | ORAL_TABLET | Freq: Every evening | ORAL | Status: DC | PRN
  Administered 2020-08-16: 5 mg via ORAL
  Filled 2020-08-15 (×3): qty 1

## 2020-08-15 MED ORDER — POLYETHYLENE GLYCOL 3350 17 GM/SCOOP PO POWD
17.0000 g | Freq: Every day | ORAL | Status: DC
  Filled 2020-08-15: qty 255

## 2020-08-15 MED ORDER — INSULIN ASPART 100 UNIT/ML ~~LOC~~ SOLN
0.0000 [IU] | Freq: Three times a day (TID) | SUBCUTANEOUS | Status: DC
  Administered 2020-08-16: 07:00:00 2 [IU] via SUBCUTANEOUS
  Administered 2020-08-16 – 2020-08-18 (×5): 3 [IU] via SUBCUTANEOUS
  Administered 2020-08-19: 2 [IU] via SUBCUTANEOUS
  Administered 2020-08-19 (×2): 3 [IU] via SUBCUTANEOUS
  Administered 2020-08-20: 2 [IU] via SUBCUTANEOUS
  Administered 2020-08-20: 5 [IU] via SUBCUTANEOUS

## 2020-08-15 MED ORDER — SODIUM CHLORIDE 0.9 % IV SOLN: 250.0000 mL | INTRAVENOUS | Status: DC | PRN

## 2020-08-15 MED ORDER — ALPRAZOLAM 0.25 MG PO TABS
0.2500 mg | ORAL_TABLET | Freq: Two times a day (BID) | ORAL | Status: DC | PRN
  Administered 2020-08-16 (×2): 0.25 mg via ORAL
  Filled 2020-08-15 (×3): qty 1

## 2020-08-15 MED ORDER — DORZOLAMIDE HCL 2 % OP SOLN
1.0000 [drp] | Freq: Two times a day (BID) | OPHTHALMIC | Status: DC
  Administered 2020-08-16 – 2020-08-20 (×9): 1 [drp] via OPHTHALMIC
  Filled 2020-08-15: qty 10

## 2020-08-15 MED ORDER — VITAMIN D 25 MCG (1000 UNIT) PO TABS
1000.0000 [IU] | ORAL_TABLET | Freq: Every day | ORAL | Status: DC
  Administered 2020-08-16 – 2020-08-20 (×5): 1000 [IU] via ORAL
  Filled 2020-08-15 (×5): qty 1

## 2020-08-15 MED ORDER — TRAMADOL HCL 50 MG PO TABS
50.0000 mg | ORAL_TABLET | Freq: Every day | ORAL | Status: DC
  Administered 2020-08-15 – 2020-08-19 (×5): 50 mg via ORAL
  Filled 2020-08-15 (×5): qty 1

## 2020-08-15 MED ORDER — NYSTATIN 100000 UNIT/GM EX POWD: 1.0000 "application " | Freq: Every day | CUTANEOUS | Status: DC | PRN

## 2020-08-15 MED ORDER — APIXABAN 5 MG PO TABS
5.0000 mg | ORAL_TABLET | Freq: Two times a day (BID) | ORAL | Status: DC
  Filled 2020-08-15: qty 1

## 2020-08-15 MED ORDER — FEBUXOSTAT 40 MG PO TABS
40.0000 mg | ORAL_TABLET | ORAL | Status: DC
  Administered 2020-08-16 – 2020-08-19 (×2): 40 mg via ORAL
  Filled 2020-08-15 (×2): qty 1

## 2020-08-15 MED ORDER — INSULIN GLARGINE 100 UNIT/ML ~~LOC~~ SOLN
5.0000 [IU] | Freq: Every day | SUBCUTANEOUS | Status: DC
  Administered 2020-08-16 – 2020-08-19 (×4): 5 [IU] via SUBCUTANEOUS
  Filled 2020-08-15 (×8): qty 0.05

## 2020-08-15 MED ORDER — FERROUS SULFATE 325 (65 FE) MG PO TABS
325.0000 mg | ORAL_TABLET | Freq: Two times a day (BID) | ORAL | Status: DC
  Administered 2020-08-15 – 2020-08-20 (×10): 325 mg via ORAL
  Filled 2020-08-15 (×11): qty 1

## 2020-08-15 MED ORDER — MONTELUKAST SODIUM 10 MG PO TABS
10.0000 mg | ORAL_TABLET | Freq: Every day | ORAL | Status: DC
  Administered 2020-08-15 – 2020-08-19 (×5): 10 mg via ORAL
  Filled 2020-08-15 (×5): qty 1

## 2020-08-15 MED ORDER — LEVOTHYROXINE SODIUM 50 MCG PO TABS
50.0000 ug | ORAL_TABLET | Freq: Every day | ORAL | Status: DC
  Administered 2020-08-15 – 2020-08-19 (×5): 50 ug via ORAL
  Filled 2020-08-15 (×5): qty 1

## 2020-08-15 MED ORDER — ONDANSETRON HCL 4 MG/2ML IJ SOLN
4.0000 mg | Freq: Four times a day (QID) | INTRAMUSCULAR | Status: DC | PRN
  Administered 2020-08-17 – 2020-08-19 (×4): 4 mg via INTRAVENOUS
  Filled 2020-08-15 (×3): qty 2

## 2020-08-15 MED ORDER — PANTOPRAZOLE SODIUM 40 MG PO TBEC
40.0000 mg | DELAYED_RELEASE_TABLET | Freq: Every day | ORAL | Status: DC
  Administered 2020-08-15 – 2020-08-16 (×2): 40 mg via ORAL
  Filled 2020-08-15 (×2): qty 1

## 2020-08-15 MED ORDER — ROSUVASTATIN CALCIUM 5 MG PO TABS
5.0000 mg | ORAL_TABLET | Freq: Every day | ORAL | Status: DC
  Administered 2020-08-15 – 2020-08-20 (×6): 5 mg via ORAL
  Filled 2020-08-15 (×6): qty 1

## 2020-08-15 MED ORDER — GABAPENTIN 300 MG PO CAPS: 300.0000 mg | ORAL_CAPSULE | Freq: Every day | ORAL | Status: DC

## 2020-08-15 MED ORDER — ROPINIROLE HCL 0.5 MG PO TABS
0.5000 mg | ORAL_TABLET | Freq: Every day | ORAL | Status: DC
  Administered 2020-08-15 – 2020-08-19 (×5): 0.5 mg via ORAL
  Filled 2020-08-15 (×7): qty 1

## 2020-08-15 MED ORDER — GABAPENTIN 400 MG PO CAPS
400.0000 mg | ORAL_CAPSULE | Freq: Every day | ORAL | Status: DC
  Administered 2020-08-15 – 2020-08-19 (×5): 400 mg via ORAL
  Filled 2020-08-15 (×5): qty 1

## 2020-08-15 MED ORDER — CARVEDILOL 3.125 MG PO TABS
3.1250 mg | ORAL_TABLET | Freq: Two times a day (BID) | ORAL | Status: DC
  Administered 2020-08-16 – 2020-08-20 (×8): 3.125 mg via ORAL
  Filled 2020-08-15 (×10): qty 1

## 2020-08-15 MED ORDER — SENNOSIDES-DOCUSATE SODIUM 8.6-50 MG PO TABS
1.0000 | ORAL_TABLET | Freq: Every day | ORAL | Status: DC
  Administered 2020-08-15 – 2020-08-17 (×3): 1 via ORAL
  Filled 2020-08-15 (×4): qty 1

## 2020-08-15 MED ORDER — SODIUM CHLORIDE 0.9% IV SOLUTION: Freq: Once | INTRAVENOUS | Status: AC

## 2020-08-15 MED ORDER — INSULIN ASPART 100 UNIT/ML ~~LOC~~ SOLN
0.0000 [IU] | Freq: Every day | SUBCUTANEOUS | Status: DC
  Administered 2020-08-15: 23:00:00 3 [IU] via SUBCUTANEOUS

## 2020-08-15 NOTE — Progress Notes (Signed)
Cardiology on-call Notified by RN of pt's Hgb 6.3. Last one was 10.5 about two weeks ago. No report of blood in stool or urine, hematemesis, or any other sign of active bleeding. Will hold eliquis and transfuse 2 units PRBCs. Recheck CBC post-transfusion and plan for GI evaluation in the AM Rudean Curt, MD , [facility] 10:33 PM

## 2020-08-15 NOTE — H&P (Signed)
Cardiology Admission History and Physical:  Patient ID: Tricia Gonzales MRN: [id number] DOB: 05-Jun-1941  Admit date: 08/15/2020  Primary Care Provider: Carol Ada, MD Primary Cardiologist: No primary care provider on file.  Chief Complaint: Decompensated diastolic heart failure  Patient Profile:  Tricia Gonzales is a 80 y.o. female with paroxysmal atrial fibrillation (CHA2DS2-VASc score equals 5), junctional bradycardia status post pacemaker, diabetes, hypertension, CKD stage III/IV, hyperlipidemia, anemia who was admitted on 08/15/2020 as a direct admission for acute decompensated diastolic heart failure and worsening renal failure.  History of Present Illness:  Tricia Gonzales 2 weeks of worsening shortness of breath and lower extremity edema.  She was evaluated by me roughly 3 weeks ago and found to be in recurrent atrial fibrillation.  She underwent direct-current cardioversion with return to sinus rhythm.  She is a be paced.  BNP was elevated.  She reports she still not feeling well.  Labs were notable in follow-up for worsening kidney failure and she is uremic with a BUN of 106.  She reports she is just not feeling well.  Primary care physician concerned about amiodarone.  She is grossly volume overloaded at my clinic visit.  I think symptoms of heart failure and worsening renal failure are the bigger concern and amiodarone.  She also underwent an echocardiogram on 08/09/2020.  She has evidence of elevated filling pressures and a dilated plethoric IVC.  She is volume overloaded.  Pulmonary pressures in the 60s.  Due to worsening shortness of breath and lower extremity edema she was directly admitted for IV diuretic therapy.  She denies any chest pain.  Symptoms are mainly shortness of breath, fatigue and edema.  She also reports of nausea.  No fevers, chills reported.  Heart Pathway Score:       Past Medical History: Past Medical History:  Diagnosis Date  . Anemia   . Anemia in chronic kidney  disease 10/29/2015  . Anxiety   . Arthritis   . Atrial fibrillation ([place])   . Avascular necrosis (HCC) hip left   leg pain also  . Chronic back pain   . Chronic kidney disease (CKD), stage III (moderate) (HCC)   . Depression   . Diverticulosis   . Early cataracts, bilateral   . Esophageal stricture   . Gastritis   . GERD (gastroesophageal reflux disease)   . Glaucoma   . Heart murmur     " some doctors say that I have one some say that I don"t "  . Hiatal hernia   . History of blood transfusion    "@ least w/1st knee OR"  . History of gout   . History of kidney stones   . Hyperlipidemia   . Hypertension   . Intestinal obstruction ([place])   . Kidney stones   . Obesity   . Osteoarthritis   . Osteoporosis   . Pericarditis 2016  . Presence of permanent cardiac pacemaker   . Sjogren's disease ([place])   . Thyroid disease   . Type II diabetes mellitus ([place])   . Vitamin B12 deficiency     Past Surgical History: Past Surgical History:  Procedure Laterality Date  . ABDOMINAL HYSTERECTOMY     complete  . CARDIOVERSION N/A 07/31/2020   Procedure: CARDIOVERSION;  Surgeon: Werner Lean, MD;  Location: [facility];  Service: Cardiovascular;  Laterality: N/A;  . CHOLECYSTECTOMY OPEN    . COLECTOMY     for rectovaginal fistula  . COLONOSCOPY  01/07/2012   Procedure: COLONOSCOPY;  Surgeon: Lafayette Dragon, MD;  Location: [facility];  Service: Endoscopy;  Laterality: N/A;  . EP IMPLANTABLE DEVICE N/A 06/18/2015   Procedure: Pacemaker Implant;  Surgeon: Evans Lance, MD;  Location: [facility];  Service: Cardiovascular;  Laterality: N/A;  . ESOPHAGOGASTRODUODENOSCOPY N/A 11/24/2018   Procedure: ESOPHAGOGASTRODUODENOSCOPY (EGD);  Surgeon: Yetta Flock, MD;  Location: [facility];  Service: Gastroenterology;  Laterality: N/A;  . ESOPHAGOGASTRODUODENOSCOPY (EGD) WITH ESOPHAGEAL DILATION    . ESOPHAGOGASTRODUODENOSCOPY (EGD) WITH PROPOFOL N/A 08/12/2015   Procedure:  ESOPHAGOGASTRODUODENOSCOPY (EGD) WITH PROPOFOL;  Surgeon: Manus Gunning, MD;  Location: [place] [facility];  Service: Gastroenterology;  Laterality: N/A;  . EYE SURGERY Bilateral    cataracts  . HOT HEMOSTASIS N/A 11/24/2018   Procedure: HOT HEMOSTASIS (ARGON PLASMA COAGULATION/BICAP);  Surgeon: Yetta Flock, MD;  Location: [facility];  Service: Gastroenterology;  Laterality: N/A;  . I & D EXTREMITY Right 08/06/2016   Procedure: DEBRIDEMENT PIP RIGHT RING FINGER;  Surgeon: Daryll Brod, MD;  Location: [place];  Service: Orthopedics;  Laterality: Right;  . INSERT / REPLACE / REMOVE PACEMAKER    . JOINT REPLACEMENT    . KNEE ARTHROSCOPY Right   . MASS EXCISION Right 08/06/2016   Procedure: EXCISION CYST;  Surgeon: Daryll Brod, MD;  Location: [place];  Service: Orthopedics;  Laterality: Right;  . REVISION TOTAL KNEE ARTHROPLASTY Left   . TOTAL KNEE ARTHROPLASTY Bilateral      Medications Prior to Admission: Prior to Admission medications   Medication Sig Start Date End Date Taking? Authorizing Provider  ACCU-CHEK AVIVA PLUS test strip 1 each by Other route daily.  03/15/15   [provider]  acetaminophen (TYLENOL) 650 MG CR tablet Take 1,300 mg by mouth at bedtime.    [provider]  amiodarone (PACERONE) 200 MG tablet Take 1 tablet (200 mg total) by mouth daily. 07/19/20   O'Neal, Cassie Freer, MD  BD PEN NEEDLE NANO U/F 32G X 4 MM MISC See admin instructions. 12/25/19   [provider]  carvedilol (COREG) 3.125 MG tablet Take 2 tablets in the AM and 1 tablet in the PM Patient taking differently: Take 3.125-6.25 mg by mouth See admin instructions. Take 6.25 mg in the morning and 3.125 mg at bedtime 07/11/20   Sherran Needs, NP  Cholecalciferol (VITAMIN D3) 25 MCG (1000 UT) CAPS Take 1,000 Units by mouth daily.    [provider]  dorzolamide (TRUSOPT) 2 % ophthalmic solution Place 1 drop into the right eye 2 (two) times daily. 05/12/19   [place], Arlo  C, PA-C  ELIQUIS 5 MG TABS tablet TAKE ONE TABLET TWICE DAILY Patient taking differently: Take 5 mg by mouth 2 (two) times daily. 03/21/20   Evans Lance, MD  febuxostat (ULORIC) 40 MG tablet Take 1 tablet (40 mg total) by mouth every Monday, Wednesday, and Friday. 05/12/19   [place] Lewis C, PA-C  ferrous sulfate 325 (65 FE) MG EC tablet Take 1 tablet (325 mg total) by mouth 2 (two) times daily. 09/27/15   Ladell Pier, MD  furosemide (LASIX) 40 MG tablet Take 1 tablet (40 mg total) by mouth daily. 07/19/20   O'NealCassie Freer, MD  gabapentin (NEURONTIN) 300 MG capsule Take 1 capsule (300 mg total) by mouth daily. 05/12/19   [place] Lewis C, PA-C  gabapentin (NEURONTIN) 400 MG capsule Take 1 capsule (400 mg total) by mouth at bedtime. 05/12/19   [place] Lewis C, PA-C  JANUVIA 50 MG tablet Take  1 tablet (50 mg total) by mouth daily. Patient taking differently: Take 25 mg by mouth daily. 05/12/19   Wille Celeste, PA-C  levothyroxine (SYNTHROID) 50 MCG tablet Take 1 tablet (50 mcg total) by mouth at bedtime. 05/12/19   [place] Lewis C, PA-C  montelukast (SINGULAIR) 10 MG tablet Take 1 tablet (10 mg total) by mouth at bedtime. 05/12/19   [place] Lewis C, PA-C  nystatin (NYAMYC) powder APPLY TOPICALLY TO LEFT ABDOMINAL FOLD AND GROIN TWICE A DAY Patient taking differently: Apply 1 application topically daily as needed (rash). LEFT ABDOMINAL FOLD AND GROIN 05/12/19   [place] Lewis C, PA-C  pantoprazole (PROTONIX) 40 MG tablet Take 1 tablet (40 mg total) by mouth daily. 05/12/19   [place] Lewis C, PA-C  pentoxifylline (TRENTAL) 400 MG CR tablet Take 1 tablet (400 mg total) by mouth daily. 05/12/19   Wille Celeste, PA-C  Polyethylene Glycol 3350 (MIRALAX PO) Take 17 g by mouth daily.    [provider]  Probiotic Product (PROBIOTIC DAILY PO) Take 1 tablet by mouth daily.    [provider]  rOPINIRole (REQUIP) 0.5 MG tablet Take 0.5 mg by mouth at bedtime. 02/23/20   [provider]  rosuvastatin (CRESTOR) 5 MG tablet Take 1 tablet (5 mg total) by mouth daily. 05/12/19   [place] Lewis C, PA-C  senna-docusate (SENOKOT-S) 8.6-50 MG tablet Take 1 tablet by mouth at bedtime.    [provider]  traMADol (ULTRAM) 50 MG tablet Take 50 mg by mouth at bedtime. 02/13/20   [provider]  TRESIBA FLEXTOUCH 100 UNIT/ML FlexTouch Pen Inject 8 Units into the skin daily. 02/21/20   [provider]     Allergies:    Allergies  Allergen Reactions  . Norvasc [Amlodipine Besylate] Shortness Of Breath  . Avapro [Irbesartan]     Headaches  . Glucophage [Metformin Hydrochloride] Other (See Comments)    "increases creatinine"  . Lisinopril     Headache   . Losartan     Headache   . Morphine And Related Other (See Comments)    "hallucinations"  . Simvastatin Other (See Comments)    Body aches  . Trulicity [Dulaglutide] Other (See Comments)    Severe mood swings  . Fiber Rash    Pt voiced she is allergic to micro fiber materials/ blankets    Social History:   Social History   Socioeconomic History  . Marital status: Divorced    Spouse name: Not on file  . Number of children: 2  . Years of education: Not on file  . Highest education level: Not on file  Occupational History  . Occupation: retired  Tobacco Use  . Smoking status: Former Smoker    Packs/day: 2.00    Years: 3.00    Pack years: 6.00    Types: Cigarettes    Quit date: 06/23/1983    Years since quitting: 37.1  . Smokeless tobacco: Never Used  Vaping Use  . Vaping Use: Never used  Substance and Sexual Activity  . Alcohol use: Yes    Alcohol/week: 2.0 standard drinks    Types: 2 Glasses of wine per week    Comment: occ  . Drug use: No  . Sexual activity: Never  Other Topics Concern  . Not on file  Social History Narrative  . Not on file   Social Determinants of Health   Financial Resource Strain: Not on file  Food Insecurity: Not on file  Transportation  Needs: Not  on file  Physical Activity: Not on file  Stress: Not on file  Social Connections: Not on file  Intimate Partner Violence: Not on file     Family History:   The patient's family history includes Diabetes in her brother and mother; Hypertension in her sister. There is no history of Colon cancer, Heart attack, Stroke, Colon polyps, Esophageal cancer, Rectal cancer, or Stomach cancer.    ROS:  All other ROS reviewed and negative. Pertinent positives noted in the HPI.     Physical Exam/Data:  There were no vitals filed for this visit. No intake or output data in the 24 hours ending 08/15/20 2028  Last 3 Weights 08/15/2020 07/19/2020 07/11/2020  Weight (lbs) 184 lb 184 lb 9.6 oz 183 lb 12.8 oz  Weight (kg) 83.462 kg 83.734 kg 83.371 kg    There is no height or weight on file to calculate BMI.   General: Well nourished, well developed, in no acute distress Head: Atraumatic, normal size  Eyes: PEERLA, EOMI  Neck: Supple, JVD 10 to 12 cm of water Endocrine: No thryomegaly Cardiac: Normal S1, S2; RRR; no murmurs, rubs, or gallops Lungs: Crackles at the lung bases Abd: Soft, nontender, no hepatomegaly  Ext: 2+ pitting edema up to the knees Musculoskeletal: No deformities, BUE and BLE strength normal and equal Skin: Warm and dry, no rashes   Neuro: Alert and oriented to person, place, time, and situation, CNII-XII grossly intact, no focal deficits  Psych: Normal mood and affect   EKG:  The ECG that was done and was personally reviewed and demonstrates AV paced rhythm  Relevant CV Studies: TTE 08/09/2020   1. Left ventricular ejection fraction, by estimation, is 55 to 60%. The  left ventricle has normal function. The left ventricle has no regional  wall motion abnormalities. The left ventricular internal cavity size was  moderately dilated. Left ventricular  diastolic parameters are indeterminate.  2. Right ventricular systolic function is normal. The right ventricular  size  is moderately enlarged. There is severely elevated pulmonary artery  systolic pressure. The estimated right ventricular systolic pressure is  95.0 mmHg.  3. Left atrial size was severely dilated.  4. Right atrial size was mildly dilated.  5. The mitral valve is abnormal. Moderate to severe mitral valve  regurgitation.   Predominant mechanism appears to be atrial function and ventricular  dilation in nature. No evidence of mitral stenosis. Moderate mitral  annular calcification.  6. Tricuspid valve regurgitation is moderate.  7. The aortic valve is tricuspid. There is mild thickening of the aortic  valve. Aortic valve regurgitation is not visualized. No aortic stenosis is  present.  8. The inferior vena cava is dilated in size with <50% respiratory  variability, suggesting right atrial pressure of 15 mmHg.   Laboratory Data: High Sensitivity Troponin:  No results for input(s): TROPONINIHS in the last 720 hours.    Cardiac EnzymesNo results for input(s): TROPONINI in the last 168 hours. No results for input(s): TROPIPOC in the last 168 hours.  ChemistryNo results for input(s): NA, K, CL, CO2, GLUCOSE, BUN, CREATININE, CALCIUM, GFRNONAA, GFRAA, ANIONGAP in the last 168 hours.  No results for input(s): PROT, ALBUMIN, AST, ALT, ALKPHOS, BILITOT in the last 168 hours. HematologyNo results for input(s): WBC, RBC, HGB, HCT, MCV, MCH, MCHC, RDW, PLT in the last 168 hours. BNPNo results for input(s): BNP, PROBNP in the last 168 hours.  DDimer No results for input(s): DDIMER in the last 168 hours.  Radiology/Studies:  No  results found.  Assessment and Plan:   1.  Acute on chronic diastolic heart failure 2.  Shortness of breath 3.  Acute kidney injury -Directly admitted from clinic for worsening diastolic heart failure.  Labs show worsening kidney function.  She is volume overloaded. -We will check a chest x-ray, CBC, CMP, BNP.  We will start with aggressive diuresis with 80 mg IV  Lasix twice daily. -Recheck limited echo in the morning. -She may need nephrology evaluation if her uremia continues to worsen.  Hopefully this will improve with diuresis. -Strict I's and O's -Maintain potassium and magnesium above 4 and 2  4.  Persistent atrial fibrillation status post DCCV -She is a pacemaker in place. -Recent cardioversion. -Continue amiodarone for rhythm control strategy.  She is also on a beta-blocker. -Continue Eliquis for stroke prophylaxis.  5.  Anemia of chronic disease -CBC.  Continue to monitor.  May need nephrology opinion for Aranesp.  6.  Diabetes -Hold oral agents.  Sliding scale insulin.  FEN -No intravenous fluids -DVT PPx: Eliquis -Diet: Salt restricted -Code: Full  Severity of Illness: The appropriate patient status for this patient is INPATIENT. Inpatient status is judged to be reasonable and necessary in order to provide the required intensity of service to ensure the patient's safety. The patient's presenting symptoms, physical exam findings, and initial radiographic and laboratory data in the context of their chronic comorbidities is felt to place them at high risk for further clinical deterioration. Furthermore, it is not anticipated that the patient will be medically stable for discharge from the hospital within 2 midnights of admission. The following factors support the patient status of inpatient.   " The patient's presenting symptoms include diastolic heart failure/volume overload. " The worrisome physical exam findings include pulmonary edema, lower extremity edema. " The initial radiographic and laboratory data are worrisome because of rising serum creatinine and BUN. " The chronic co-morbidities include diabetes, diastolic heart failure, hypertension.  * I certify that at the point of admission it is my clinical judgment that the patient will require inpatient hospital care spanning beyond 2 midnights from the point of admission due to  high intensity of service, high risk for further deterioration and high frequency of surveillance required.*   For questions or updates, please contact [place] Please consult [email or web address] for contact info under     Signed, [place] T. Audie Box, [place]  08/15/2020 8:28 PM

## 2020-08-15 NOTE — Progress Notes (Signed)
Patient admitted to 3e10 in NAD. VSS on arrival. Pt attached to tele monitor. MD notified of arrival. Pt oriented to room and unit.Falls precautions in place. Orders reviewed.

## 2020-08-15 NOTE — Progress Notes (Signed)
Direct admit patient has arrived to unit. Cardiology MD paged.

## 2020-08-15 NOTE — Patient Instructions (Signed)
Medication Instructions:  The current medical regimen is effective;  continue present plan and medications.  *If you need a refill on your cardiac medications before your next appointment, please call your pharmacy*   Follow-Up: At [facility], you and your health needs are our priority.  As part of our continuing mission to provide you with exceptional heart care, we have created designated Provider Care Teams.  These Care Teams include your primary Cardiologist (physician) and Advanced Practice Providers (APPs -  Physician Assistants and Nurse Practitioners) who all work together to provide you with the care you need, when you need it.  We recommend signing up for the patient portal called "MyChart".  Sign up information is provided on this After Visit Summary.  MyChart is used to connect with patients for Virtual Visits (Telemedicine).  Patients are able to view lab/test results, encounter notes, upcoming appointments, etc.  Non-urgent messages can be sent to your provider as well.   To learn more about what you can do with MyChart, go to [email or web address].    Your next appointment:   2 week(s)  The format for your next appointment:   In Person  Provider:   Eleonore Chiquito, MD

## 2020-08-16 ENCOUNTER — Inpatient Hospital Stay (HOSPITAL_COMMUNITY): Payer: Medicare Other

## 2020-08-16 ENCOUNTER — Inpatient Hospital Stay: Payer: Medicare Other

## 2020-08-16 ENCOUNTER — Encounter (HOSPITAL_COMMUNITY): Payer: Self-pay | Admitting: Cardiovascular Disease

## 2020-08-16 DIAGNOSIS — I5031 Acute diastolic (congestive) heart failure: Secondary | ICD-10-CM | POA: Diagnosis not present

## 2020-08-16 DIAGNOSIS — K31819 Angiodysplasia of stomach and duodenum without bleeding: Secondary | ICD-10-CM

## 2020-08-16 DIAGNOSIS — R1310 Dysphagia, unspecified: Secondary | ICD-10-CM

## 2020-08-16 DIAGNOSIS — D62 Acute posthemorrhagic anemia: Secondary | ICD-10-CM

## 2020-08-16 DIAGNOSIS — D631 Anemia in chronic kidney disease: Secondary | ICD-10-CM

## 2020-08-16 DIAGNOSIS — N189 Chronic kidney disease, unspecified: Secondary | ICD-10-CM

## 2020-08-16 DIAGNOSIS — D649 Anemia, unspecified: Secondary | ICD-10-CM

## 2020-08-16 LAB — ECHOCARDIOGRAM LIMITED
Calc EF: 51.1 %
Height: 58 in
S' Lateral: 3.8 cm
Single Plane A2C EF: 52.7 %
Single Plane A4C EF: 50.3 %
Weight: 2907.2 oz

## 2020-08-16 LAB — GLUCOSE, CAPILLARY
Glucose-Capillary: 139 mg/dL — ABNORMAL HIGH (ref 70–99)
Glucose-Capillary: 253 mg/dL — ABNORMAL HIGH (ref 70–99)
Glucose-Capillary: 184 mg/dL — ABNORMAL HIGH (ref 70–99)
Glucose-Capillary: 192 mg/dL — ABNORMAL HIGH (ref 70–99)

## 2020-08-16 LAB — RESP PANEL BY RT PCR (RSV, FLU A&B, COVID)
Influenza A by PCR: NEGATIVE
Influenza B by PCR: NEGATIVE
Respiratory Syncytial Virus by PCR: NEGATIVE
SARS Coronavirus 2 by RT PCR: NEGATIVE

## 2020-08-16 LAB — CBC
HCT: 18.3 % — ABNORMAL LOW (ref 36.0–46.0)
HCT: 24.8 % — ABNORMAL LOW (ref 36.0–46.0)
Hemoglobin: 5.6 g/dL — CL (ref 12.0–15.0)
Hemoglobin: 7.8 g/dL — ABNORMAL LOW (ref 12.0–15.0)
MCH: 30.9 pg (ref 26.0–34.0)
MCH: 31.3 pg (ref 26.0–34.0)
MCHC: 30.6 g/dL (ref 30.0–36.0)
MCHC: 31.5 g/dL (ref 30.0–36.0)
MCV: 101.1 fL — ABNORMAL HIGH (ref 80.0–100.0)
MCV: 99.6 fL (ref 80.0–100.0)
Platelets: 297 10*3/uL (ref 150–400)
Platelets: 302 K/uL (ref 150–400)
RBC: 1.81 MIL/uL — ABNORMAL LOW (ref 3.87–5.11)
RBC: 2.49 MIL/uL — ABNORMAL LOW (ref 3.87–5.11)
RDW: 18.3 % — ABNORMAL HIGH (ref 11.5–15.5)
RDW: 18 % — ABNORMAL HIGH (ref 11.5–15.5)
WBC: 8.5 10*3/uL (ref 4.0–10.5)
WBC: 8.3 K/uL (ref 4.0–10.5)
nRBC: 0 % (ref 0.0–0.2)
nRBC: 0 % (ref 0.0–0.2)

## 2020-08-16 LAB — HEMOGLOBIN AND HEMATOCRIT, BLOOD
HCT: 25.5 % — ABNORMAL LOW (ref 36.0–46.0)
Hemoglobin: 8 g/dL — ABNORMAL LOW (ref 12.0–15.0)

## 2020-08-16 LAB — BASIC METABOLIC PANEL
Anion gap: 11 (ref 5–15)
BUN: 81 mg/dL — ABNORMAL HIGH (ref 8–23)
CO2: 27 mmol/L (ref 22–32)
Calcium: 8.8 mg/dL — ABNORMAL LOW (ref 8.9–10.3)
Chloride: 101 mmol/L (ref 98–111)
Creatinine, Ser: 2.16 mg/dL — ABNORMAL HIGH (ref 0.44–1.00)
GFR, Estimated: 23 mL/min — ABNORMAL LOW (ref >60)
Glucose, Bld: 152 mg/dL — ABNORMAL HIGH (ref 70–99)
Potassium: 4.7 mmol/L (ref 3.5–5.1)
Sodium: 139 mmol/L (ref 135–145)

## 2020-08-16 LAB — VITAMIN B12: Vitamin B-12: 348 pg/mL (ref 180–914)

## 2020-08-16 LAB — FOLATE: Folate: 29.8 ng/mL (ref >5.9)

## 2020-08-16 LAB — PREPARE RBC (CROSSMATCH)

## 2020-08-16 MED ORDER — SODIUM CHLORIDE 0.9% IV SOLUTION: Freq: Once | INTRAVENOUS | Status: DC

## 2020-08-16 MED ORDER — PANTOPRAZOLE SODIUM 40 MG IV SOLR
40.0000 mg | Freq: Two times a day (BID) | INTRAVENOUS | Status: DC
  Administered 2020-08-16 – 2020-08-18 (×4): 40 mg via INTRAVENOUS
  Filled 2020-08-16 (×4): qty 40

## 2020-08-16 NOTE — Progress Notes (Addendum)
Progress Note  Patient Name: Tricia Gonzales Date of Encounter: 08/16/2020  [facility] Cardiologist: Evalina Field, MD   Subjective   Patient was a direct admit overnight for suspected acute decompensated diastolic CHF. Labs on arrival revealed Hgb 6.3, down from 10.5 07/31/20. She has a history of anemia which was felt to be 2/2 iron deficiency and CKD. She receives iron infusions and Epo, though states she missed her last couple epo injections. She reports she follows with [name] for GI and has a history of GAVE s/p ablation. She has chronic dark stools 2/2 her iron infusions but denies hematochezia. She does note abdominal discomfort radiating from LUQ across her abdomen when eating or drinking. She has not had a BM this admission.  She notes some improvement in her SOB and weakness since arrival to the hospital. She denies any chest pain or palpitations. She is worried about being off her blood thinner but we discussed risks outweighing benefits at this time.    Inpatient Medications    Scheduled Meds: . acidophilus  1 capsule Oral Daily  . amiodarone  200 mg Oral Daily  . carvedilol  3.125 mg Oral BID WC  . cholecalciferol  1,000 Units Oral Daily  . dorzolamide  1 drop Right Eye BID  . febuxostat  40 mg Oral Q M,W,F  . ferrous sulfate  325 mg Oral BID  . furosemide  80 mg Intravenous BID  . gabapentin  300 mg Oral Daily  . gabapentin  400 mg Oral QHS  . insulin aspart  0-15 Units Subcutaneous TID WC  . insulin aspart  0-5 Units Subcutaneous QHS  . insulin glargine  5 Units Subcutaneous QHS  . levothyroxine  50 mcg Oral QHS  . montelukast  10 mg Oral QHS  . pantoprazole  40 mg Oral Daily  . pentoxifylline  400 mg Oral Daily  . polyethylene glycol  17 g Oral Daily  . rOPINIRole  0.5 mg Oral QHS  . rosuvastatin  5 mg Oral Daily  . senna-docusate  1 tablet Oral QHS  . sodium chloride flush  3 mL Intravenous Q12H  . traMADol  50 mg Oral QHS   Continuous  Infusions: . sodium chloride     PRN Meds: sodium chloride, acetaminophen, ALPRAZolam, nitroGLYCERIN, nystatin, ondansetron (ZOFRAN) IV, sodium chloride flush, zolpidem   Vital Signs    Vitals:   08/16/20 0921 08/16/20 0933 08/16/20 1200 08/16/20 1303  BP:  (!) 132/47 (!) 154/69 (!) 146/48  Pulse: (!) 59 60 61 63  Resp:  _0 Temp: 97.6 F (36.4 C) (!) 97.5 F (36.4 C) 97.9 F (36.6 C) 98 F (36.7 C)  TempSrc: Oral Oral Oral Oral  SpO2:  96% 97% 100%  Weight:      Height:        Intake/Output Summary (Last 24 hours) at 08/16/2020 1409 Last data filed at 08/16/2020 1200 Gross per 24 hour  Intake 1160 ml  Output 1300 ml  Net -140 ml   Last 3 Weights 08/16/2020 08/15/2020 08/15/2020  Weight (lbs) 181 lb 11.2 oz 183 lb 12.8 oz 184 lb  Weight (kg) 82.419 kg 83.371 kg 83.462 kg      Telemetry    AV paced with brief fun of NSVT - Personally Reviewed  ECG    AV paced, rate 63 bpm - Personally Reviewed  Physical Exam   GEN: Laying in bed in no acute distress.   Neck: No JVD Cardiac: RRR,  no murmurs, rubs, or gallops.  Respiratory: crackles at lung bases, otherwise no wheezing/rhonchi. GI: Soft, nontender, non-distended  MS: 1+ LE edema; No deformity. Neuro:  Nonfocal  Psych: Normal affect   Labs    High Sensitivity Troponin:  No results for input(s): TROPONINIHS in the last 720 hours.    Chemistry Recent Labs  Lab 08/15/20 2129 08/16/20 0619  NA 137 139  K 5.4* 4.7  CL 102 101  CO2 27 27  GLUCOSE 270* 152*  BUN 83* 81*  CREATININE 2.18* 2.16*  CALCIUM 9.0 8.8*  PROT 5.5*  --   ALBUMIN 3.1*  --   AST 14*  --   ALT 10  --   ALKPHOS 68  --   BILITOT 0.4  --   GFRNONAA 22* 23*  ANIONGAP 8 11     Hematology Recent Labs  Lab 08/15/20 2129 08/16/20 0619  WBC 8.0 8.5  RBC 2.02* 1.81*  HGB 6.3* 5.6*  HCT 20.9* 18.3*  MCV 103.5* 101.1*  MCH 31.2 30.9  MCHC 30.1 30.6  RDW 18.2* 18.3*  PLT 336 297    BNP Recent Labs  Lab 08/15/20 2129   BNP 1,245.9*     DDimer No results for input(s): DDIMER in the last 168 hours.   Radiology    Portable chest x-ray 1 view  Result Date: 08/15/2020 CLINICAL DATA:  CHF EXAM: PORTABLE CHEST 1 VIEW COMPARISON:  07/23/2020 FINDINGS: Left-sided pacing device as before. Cardiomegaly with vascular congestion. Aortic atherosclerosis. Trace pleural effusions. No overt pulmonary edema. No pneumothorax. IMPRESSION: Cardiomegaly with vascular congestion and trace pleural effusions. Electronically Signed   By: Donavan Foil M.D.   On: 08/15/2020 21:08    Cardiac Studies   Echocardiogram 08/09/20: 1. Left ventricular ejection fraction, by estimation, is 55 to 60%. The  left ventricle has normal function. The left ventricle has no regional  wall motion abnormalities. The left ventricular internal cavity size was  moderately dilated. Left ventricular  diastolic parameters are indeterminate.  2. Right ventricular systolic function is normal. The right ventricular  size is moderately enlarged. There is severely elevated pulmonary artery  systolic pressure. The estimated right ventricular systolic pressure is  78.9 mmHg.  3. Left atrial size was severely dilated.  4. Right atrial size was mildly dilated.  5. The mitral valve is abnormal. Moderate to severe mitral valve  regurgitation.   Predominant mechanism appears to be atrial function and ventricular  dilation in nature. No evidence of mitral stenosis. Moderate mitral  annular calcification.  6. Tricuspid valve regurgitation is moderate.  7. The aortic valve is tricuspid. There is mild thickening of the aortic  valve. Aortic valve regurgitation is not visualized. No aortic stenosis is  present.  8. The inferior vena cava is dilated in size with <50% respiratory  variability, suggesting right atrial pressure of 15 mmHg.    Echocardiogram 08/15/20: 1. Left ventricular ejection fraction, by estimation, is 50 to 55%. The  left  ventricle has low normal function. The left ventricle has no regional  wall motion abnormalities.  2. Right ventricular systolic function is normal. The right ventricular  size is normal. There is moderately elevated pulmonary artery systolic  pressure.  3. The mitral valve is normal in structure. Moderate to severe mitral  valve regurgitation. No evidence of mitral stenosis. Moderate mitral  annular calcification.  4. The aortic valve is normal in structure. Aortic valve regurgitation is  not visualized. No aortic stenosis is present.  5. The inferior  vena cava is dilated in size with <50% respiratory  variability, suggesting right atrial pressure of 15 mmHg.   Patient Profile     80 y.o. female with a PMH of paroxysmal atrial fibrillation s/p DCCV 07/31/20, junctional bradycardia s/p PPM, HTN, DM type 2, CKD stage III/IV, and anemia, who was seen outpatient 08/15/20 and recommended for direct admission for acute decompensated diastolic CHF.   Assessment & Plan    1. Acute on chronic anemia: Hgb 6.3>5.6, down from 10.5 07/31/20. She follows with hematology for anemia of chronic disease/iron deficiency anemia and received IV iron infusions and Epo. She reports missing a couple doses of Epo recently. She has chronic dark stools but no hematochezia. She does report some upper abdominal discomfort with eating/drinking recently. She is now s/p 2 uPRBC. Repeat CBC pending - Will consult GI given history of GAVE and acute on chronic anemia - Will consult IM to assume care and assist with ongoing management.  - Await BM to check occult stool - PPI increased to BID  2. Acute on chronic diastolic CHF: She presents with SOB, weakness, and LE edema for the past couple weeks. BNP elevated to 1245. CXR showed cardiomegaly with vascular congestion. Additionally found to have Hgb 5.6 which is likely contributing to her symptomology. She was started on IV lasix 32m BID. Limited echo done with read pending.   - Continue IV lasix 849mdaily given ongoing crackles at lung bases and 1+ LE edema - Continue carvedilol - Continue to monitor strict I&Os and daily weights - Continue to monitor electrolytes closely and replete as needed to maintain K >4, Mg >2  3. Paroxysmal atrial fibrillation: s/p DCCV 07/31/20. Ideally would remain on uninterrupted anticoagulation for 4 weeks post cardioversion, however eliquis is on hold in light of #2. She now has acute diastolic CHF. She is AV paced on telemetry. Question whether she has had any recurrence.  - Will interrogate PPM - Continue carvedilol for rate control - Continue amiodarone for rhythm control - Continue to hold Eliquis for now  4. HTN: BP generally above goal - Will continue carvedilol and lasix as above for now - Can uptitrate as needed  5. AoCKD stage 3/4: Cr 2.16, stable from 08/05/20 though up from 1.79 07/19/20 - Continue to monitor closely with ongoing diuresis  For questions or updates, please contact [facility] consult [email or web address] for contact info under        Signed, KrAbigail ButtsPA-C  08/16/2020, 2:09 PM    Attending Note:   The patient was seen and examined.  Agree with assessment and plan as noted above.  Changes made to the above note as needed.  Patient seen and independently examined with  KrRoby LoftsPA .   We discussed all aspects of the encounter. I agree with the assessment and plan as stated above.  1.  Progressive shortness of breath: She was admitted as a direct admit yesterday from the office for what was thought to be acute on chronic diastolic congestive heart failure.  Turns out that she was profoundly anemic.  Follow-up hemoglobin was 5.  She is received 2 units of packed red blood cells.  I have called [facility] hospitalist to transfer the patient to their service.  We have also contacted contacted the GI service help [place] her anemia.  2.  PAF :  On eliquis.   Ideally she should remain on  uninterrupted anticoagulation for 4 weeks following cardioversion however if she is  found to have GI bleeding we may need to hold the Eliquis.  We will continue to follow for now.  Hypertension, continue current medications.  4.  CKD: Creatinine is 2.16.  She receives EPO for her chronic anemia.   I have spent a total of 40 minutes with patient reviewing hospital  notes , telemetry, EKGs, labs and examining patient as well as establishing an assessment and plan that was discussed with the patient. > 50% of time was spent in direct patient care.    Thayer Headings, Brooke Bonito., MD, [facility] 08/16/2020, 4:14 PM [address]  [place] Pager [phone]

## 2020-08-16 NOTE — Consult Note (Addendum)
Attending physician's note   I have taken an interval history, reviewed the chart and examined the patient. I agree with the Advanced Practitioner's note, impression, and recommendations as outlined.  80 year old female with medical history as outlined below, including history of atrial fibrillation (on Eliquis) decompensated CHF, CKD, chronic anemia, and GAVE, presents with acute on chronic symptomatic anemia (SOB, weakness).  Known history of GAVE with APC in 11/2018.  Had otherwise been doing well from an anemia standpoint, treated with oral iron and Procrit, maintaining baseline Hgb ~10.  Stools are chronically dark due to iron.  Admission evaluation notable for the following: -H/H 6.3/21, decreased to 5.6/18.  MCV 103 transfused 2 units with repeat H/H pending -BUN/creatinine 83/2.2  1) History of gastric antral vascular ectasia 2) Acute on chronic macrocytic anemia 3) History of atrial fibrillation 4) Systemic anticoagulation 5) CKD 6) CHF 7) GERD -Holding Eliquis -Started on high-dose PPI -N.p.o. at midnight -EGD tomorrow for diagnostic and therapeutic intent -Posttransfusion H/H check and additional blood products as needed per protocol -Check B12 and folate -I discussed the risks, benefits, alternatives of EGD with the patient and her family member at bedside, and she wishes to proceed  Tricia Heck, DO, FACG [phone] office           Referring Provider:  [facility]         Primary Care Physician:  Tricia Ada, MD Primary Gastroenterologist:  Tricia Cellar, MD           We were asked to see this patient for:   anemia               ASSESSMENT / PLAN:   # 80 yo female admitted for suspected acute decompensated CHF but labs reveal acute on chronic anemia with hgb of 5.6.she is on eliquis. Stools dark on oral iron. Suspect chronic GI blood loss related to GAVE. She is followed outpatient by Hematology for anemia and gets iron infusions,  Epogen  and is on oral iron  --She  needs an EGD with probable APC. Last dose of Eliquis was yesterday. Will plan for EGD tomorrow. The risks and benefits of EGD were discussed and the patient agrees to proceed.  --She has received 2 units of blood, follow up H+H pending.  --NPO after MN  # GERD, on PPI for years. Complains of frequent heartburn over last several weeks.  --Further evaluation at time of EGD. She may need to increase PPI dose  # Diverticular disease. Remote history of diverticular abscess requiring resection    # Hx of small serrated colon polyp May 2020  # See other medical problems listed below  HPI:                                                                                                                             Chief Complaint: shortness of breath, anemia  Tricia Gonzales is a 80 y.o. female with history of CKD,  PAF s/p DCCV 07/31/20, junctional bradycardia s/p PPM, DM2, HTN, gastric antral vascular ectasia, chronic anemia, diverticular abscess s/p remote segmental resection, colon polyps   Tricia Gonzales was admitted with fatigue and shob felt to be secondary to heart failure. However, hgb was 6.3, down 3 -4 grams compared to a few weeks ago. Her stools are dark on oral iron. She hasn't noticed any bowel changes. No rectal bleeding. She doesn't take NSAIDs. She had been becoming increasingly shob and weak since Christmas. No nausea / vomiting. She has very transient, intermittent postprandial LUQ pain radiating towards epigastrium but no other abdominal pain. She has been on a PPI for years but endorses heartburn over the last several weeks.   PREVIOUS ENDOSCOPIC EVALUATIONS / [place] STUDIES   June 2020 EGD for treatment of GAVE -Normal esophagus. 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 Treated with argon plasma coagulation (APC). - Normal duodenal bulb and second portion of the duodenum.  May 2020 colonoscopy for anemia  and heme positive stool.  -Diverticulosis in the entire examined colon. - End-to-end colo-colonic anastomosis, characterized by visible sutures. - One 5 mm polyp in the ascending colon, removed with a cold snare. Resected and retrieved. - Internal hemorrhoids. - The examination was otherwise normal.  Path = sessile serrated polyp  Past Medical History:  Diagnosis Date  . Anemia   . 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 Anxiety   . Arthritis   . Atrial fibrillation ([place])   . Avascular necrosis (HCC) hip left   leg pain also  . Chronic back pain   . Chronic kidney disease (CKD), stage III (moderate) (HCC)   . Depression   . Diverticulosis   . Early cataracts, bilateral   . Esophageal stricture   . Gastritis   . GERD (gastroesophageal reflux disease)   . Glaucoma   . Heart murmur     " some doctors say that I have one some say that I don"t "  . Hiatal hernia   . History of blood transfusion    "@ least w/1st knee OR"  . History of gout   . History of kidney stones   . Hyperlipidemia   . Hypertension   . Intestinal obstruction ([place])   . Kidney stones   . Obesity   . Osteoarthritis   . Osteoporosis   . Pericarditis 2016  . Presence of permanent cardiac pacemaker   . Sjogren's disease ([place])   . Thyroid disease   . Type II diabetes mellitus ([place])   . Vitamin B12 deficiency     Past Surgical History:  Procedure Laterality Date  . ABDOMINAL HYSTERECTOMY     complete  . CARDIOVERSION N/A 07/31/2020   Procedure: CARDIOVERSION;  Surgeon: Werner Lean, MD;  [facility];  Service: Cardiovascular;  Laterality: N/A;  . CHOLECYSTECTOMY OPEN    . COLECTOMY     for rectovaginal fistula  . COLONOSCOPY  01/07/2012   Procedure: COLONOSCOPY;  Surgeon: Lafayette Dragon, MD;  Location: [facility];  Service: Endoscopy;  Laterality: N/A;  . EP IMPLANTABLE DEVICE N/A 06/18/2015   Procedure: Pacemaker Implant;  Surgeon: Evans Lance, MD;  Location: [facility];  Service: Cardiovascular;  Laterality: N/A;  . ESOPHAGOGASTRODUODENOSCOPY N/A 11/24/2018   Procedure: ESOPHAGOGASTRODUODENOSCOPY (EGD);  Surgeon: Yetta Flock, MD;  Location: [facility];  Service: Gastroenterology;  Laterality: N/A;  . ESOPHAGOGASTRODUODENOSCOPY (EGD) WITH ESOPHAGEAL DILATION    . ESOPHAGOGASTRODUODENOSCOPY (  EGD) WITH PROPOFOL N/A 08/12/2015   Procedure: ESOPHAGOGASTRODUODENOSCOPY (EGD) WITH PROPOFOL;  Surgeon: Manus Gunning, MD;  Location: [facility];  Service: Gastroenterology;  Laterality: N/A;  . EYE SURGERY Bilateral    cataracts  . HOT HEMOSTASIS N/A 11/24/2018   Procedure: HOT HEMOSTASIS (ARGON PLASMA COAGULATION/BICAP);  Surgeon: Yetta Flock, MD;  Location: [facility];  Service: Gastroenterology;  Laterality: N/A;  . I & D EXTREMITY Right 08/06/2016   Procedure: DEBRIDEMENT PIP RIGHT RING FINGER;  Surgeon: Daryll Brod, MD;  Location: [facility];  Service: Orthopedics;  Laterality: Right;  . INSERT / REPLACE / REMOVE PACEMAKER    . JOINT REPLACEMENT    . KNEE ARTHROSCOPY Right   . MASS EXCISION Right 08/06/2016   Procedure: EXCISION CYST;  Surgeon: Daryll Brod, MD;  [facility];  Service: Orthopedics;  Laterality: Right;  . REVISION TOTAL KNEE ARTHROPLASTY Left   . TOTAL KNEE ARTHROPLASTY Bilateral     Prior to Admission medications   Medication Sig Start Date End Date Taking? Authorizing Provider  acetaminophen (TYLENOL) 650 MG CR tablet Take 1,300 mg by mouth every evening.   Yes [provider]  amiodarone (PACERONE) 200 MG tablet Take 1 tablet (200 mg total) by mouth daily. 07/19/20  Yes O'Neal, Cassie Freer, MD  carvedilol (COREG) 3.125 MG tablet Take 2 tablets in the AM and 1 tablet in the PM Patient taking differently: Take 3.125-6.25 mg by mouth See admin instructions. Take 6.25 mg by mouth in the morning and 3.125 mg at bedtime 07/11/20  Yes Sherran Needs, NP  Cholecalciferol (VITAMIN D3) 25 MCG (1000 UT)  CAPS Take 1,000 Units by mouth daily.   Yes [provider]  dorzolamide (TRUSOPT) 2 % ophthalmic solution Place 1 drop into the right eye 2 (two) times daily. 05/12/19  Yes Lassen, Arlo C, PA-C  ELIQUIS 5 MG TABS tablet TAKE ONE TABLET TWICE DAILY Patient taking differently: Take 5 mg by mouth 2 (two) times daily. 03/21/20  Yes Evans Lance, MD  febuxostat (ULORIC) 40 MG tablet Take 1 tablet (40 mg total) by mouth every Monday, Wednesday, and Friday. 05/12/19  Yes Lassen, Arlo C, PA-C  ferrous sulfate 325 (65 FE) MG EC tablet Take 1 tablet (325 mg total) by mouth 2 (two) times daily. 09/27/15  Yes Ladell Pier, MD  furosemide (LASIX) 40 MG tablet Take 1 tablet (40 mg total) by mouth daily. 07/19/20  Yes O'Neal, Cassie Freer, MD  gabapentin (NEURONTIN) 300 MG capsule Take 1 capsule (300 mg total) by mouth daily. Patient taking differently: Take 300 mg by mouth in the morning. 05/12/19  Yes Lassen, Arlo C, PA-C  gabapentin (NEURONTIN) 400 MG capsule Take 1 capsule (400 mg total) by mouth at bedtime. 05/12/19  Yes Lassen, Arlo C, PA-C  JANUVIA 25 MG tablet Take 25 mg by mouth daily. 08/13/20  Yes [provider]  levothyroxine (SYNTHROID) 50 MCG tablet Take 1 tablet (50 mcg total) by mouth at bedtime. 05/12/19  Yes Lassen, Arlo C, PA-C  lidocaine (LIDODERM) 5 % Place 1-3 patches onto the skin See admin instructions. Apply 1-3 patches transdermally every 24 hours as needed for pain and remove & discard patches within 12 hours or as directed by MD   Yes [provider]  montelukast (SINGULAIR) 10 MG tablet Take 1 tablet (10 mg total) by mouth at bedtime. Patient taking differently: Take 10 mg by mouth every evening. 05/12/19  Yes Lassen, Arlo C, PA-C  nystatin [facility]) powder  APPLY TOPICALLY TO LEFT ABDOMINAL FOLD AND GROIN TWICE A DAY Patient taking differently: Apply 1 application topically daily as needed (for rashes- abdominal folds and groin area). 05/12/19  Yes Lassen,  Arlo C, PA-C  pantoprazole (PROTONIX) 40 MG tablet Take 1 tablet (40 mg total) by mouth daily. Patient taking differently: Take 40 mg by mouth daily before breakfast. 05/12/19  Yes Oscar La, Arlo C, PA-C  pentoxifylline (TRENTAL) 400 MG CR tablet Take 1 tablet (400 mg total) by mouth daily. 05/12/19  Yes Oscar La, Arlo C, PA-C  Polyethylene Glycol 3350 (MIRALAX PO) Take 17 g by mouth See admin instructions. Mix 17 grams of powder into a desired beverage and drink every morning   Yes [provider]  Probiotic Product (PROBIOTIC DAILY PO) Take 1 capsule by mouth daily.   Yes [provider]  rOPINIRole (REQUIP) 0.5 MG tablet Take 0.5 mg by mouth at bedtime. 02/23/20  Yes [provider]  rosuvastatin (CRESTOR) 5 MG tablet Take 1 tablet (5 mg total) by mouth daily. Patient taking differently: Take 5 mg by mouth every evening. 05/12/19  Yes Lassen, Arlo C, PA-C  senna-docusate (SENOKOT-S) 8.6-50 MG tablet Take 1 tablet by mouth at bedtime.   Yes [provider]  traMADol (ULTRAM) 50 MG tablet Take 50 mg by mouth See admin instructions. Take 50 mg by mouth at bedtime and an additional 50 mg once a day as needed for pain 02/13/20  Yes [provider]  TRESIBA FLEXTOUCH 100 UNIT/ML FlexTouch Pen Inject 10 Units into the skin every evening. 02/21/20  Yes [provider]  ACCU-CHEK AVIVA PLUS test strip 1 each by Other route daily.  03/15/15   [provider]  BD PEN NEEDLE NANO U/F 32G X 4 MM MISC See admin instructions. 12/25/19   [provider]  JANUVIA 50 MG tablet Take 1 tablet (50 mg total) by mouth daily. Patient not taking: No sig reported 05/12/19   Wille Celeste, PA-C    Current Facility-Administered Medications  Medication Dose Route Frequency Provider Last Rate Last Admin  . 0.9 %  sodium chloride infusion  250 mL Intravenous PRN Barrett, Rhonda G, PA-C      . acetaminophen (TYLENOL) tablet 650 mg  650 mg Oral Q4H PRN Barrett,  Evelene Croon, PA-C   650 mg at 08/16/20 0837  . acidophilus (RISAQUAD) capsule 1 capsule  1 capsule Oral Daily Barrett, Rhonda G, PA-C   1 capsule at 08/16/20 1222  . ALPRAZolam Duanne Moron) tablet 0.25 mg  0.25 mg Oral BID PRN Barrett, Evelene Croon, PA-C   0.25 mg at 08/16/20 0246  . amiodarone (PACERONE) tablet 200 mg  200 mg Oral Daily Barrett, Rhonda G, PA-C   200 mg at 08/16/20 1219  . carvedilol (COREG) tablet 3.125 mg  3.125 mg Oral BID WC Barrett, Rhonda G, PA-C   3.125 mg at 08/16/20 1220  . cholecalciferol (VITAMIN D3) tablet 1,000 Units  1,000 Units Oral Daily Barrett, Rhonda G, PA-C   1,000 Units at 08/16/20 1222  . dorzolamide (TRUSOPT) 2 % ophthalmic solution 1 drop  1 drop Right Eye BID Barrett, Evelene Croon, PA-C   1 drop at 08/16/20 0839  . febuxostat (ULORIC) tablet 40 mg  40 mg Oral Q M,W,F Barrett, Rhonda G, PA-C   40 mg at 08/16/20 1219  . ferrous sulfate tablet 325 mg  325 mg Oral BID Barrett, Rhonda G, PA-C   325 mg at 08/16/20 1220  . furosemide (LASIX) injection 80 mg  80 mg Intravenous BID Barrett, Evelene Croon, PA-C   80 mg at 08/16/20 1221  . gabapentin (NEURONTIN) capsule 300 mg  300 mg Oral Daily O'Neal, Cassie Freer, MD   300 mg at 08/16/20 1220  . gabapentin (NEURONTIN) capsule 400 mg  400 mg Oral QHS Barrett, Rhonda G, PA-C   400 mg at 08/15/20 2249  . insulin aspart (novoLOG) injection 0-15 Units  0-15 Units Subcutaneous TID WC Barrett, Rhonda G, PA-C   3 Units at 08/16/20 1241  . insulin aspart (novoLOG) injection 0-5 Units  0-5 Units Subcutaneous QHS Barrett, Rhonda G, PA-C   3 Units at 08/15/20 2249  . insulin glargine (LANTUS) injection 5 Units  5 Units Subcutaneous QHS Barrett, Rhonda G, PA-C      . levothyroxine (SYNTHROID) tablet 50 mcg  50 mcg Oral QHS Barrett, Rhonda G, PA-C   50 mcg at 08/15/20 2249  . montelukast (SINGULAIR) tablet 10 mg  10 mg Oral QHS Barrett, Rhonda G, PA-C   10 mg at 08/15/20 2249  . nitroGLYCERIN (NITROSTAT) SL tablet 0.4 mg  0.4 mg Sublingual Q5 Min x  3 PRN Barrett, Rhonda G, PA-C      . nystatin (MYCOSTATIN/NYSTOP) topical powder 1 application  1 application Topical Daily PRN Barrett, Rhonda G, PA-C      . ondansetron (ZOFRAN) injection 4 mg  4 mg Intravenous Q6H PRN Barrett, Rhonda G, PA-C      . pantoprazole (PROTONIX) EC tablet 40 mg  40 mg Oral Daily Barrett, Rhonda G, PA-C   40 mg at 08/16/20 1241  . pentoxifylline (TRENTAL) CR tablet 400 mg  400 mg Oral Daily Barrett, Rhonda G, PA-C   400 mg at 08/16/20 1220  . polyethylene glycol (MIRALAX / GLYCOLAX) packet 17 g  17 g Oral Daily Geralynn Rile, MD   17 g at 08/16/20 1241  . rOPINIRole (REQUIP) tablet 0.5 mg  0.5 mg Oral QHS Barrett, Rhonda G, PA-C   0.5 mg at 08/15/20 2248  . rosuvastatin (CRESTOR) tablet 5 mg  5 mg Oral Daily Barrett, Rhonda G, PA-C   5 mg at 08/16/20 1241  . senna-docusate (Senokot-S) tablet 1 tablet  1 tablet Oral QHS Barrett, Rhonda G, PA-C   1 tablet at 08/15/20 2249  . sodium chloride flush (NS) 0.9 % injection 3 mL  3 mL Intravenous Q12H Barrett, Rhonda G, PA-C   3 mL at 08/15/20 2246  . sodium chloride flush (NS) 0.9 % injection 3 mL  3 mL Intravenous PRN Barrett, Evelene Croon, PA-C      . traMADol (ULTRAM) tablet 50 mg  50 mg Oral QHS Barrett, Rhonda G, PA-C   50 mg at 08/15/20 2249  . zolpidem (AMBIEN) tablet 5 mg  5 mg Oral QHS PRN Barrett, Rhonda G, PA-C        Allergies as of 08/15/2020 - Review Complete 08/15/2020  Allergen Reaction Noted  . Norvasc [amlodipine besylate] Shortness Of Breath 11/22/2007  . Avapro [irbesartan]  09/22/2016  . Glucophage [metformin hydrochloride] Other (See Comments) 09/15/2011  . Lisinopril  09/15/2011  . Losartan  09/15/2011  . Morphine and related Other (See Comments) 09/15/2011  . Simvastatin Other (See Comments) 03/22/2015  . Trulicity [dulaglutide] Other (See Comments) 03/22/2015  . Fiber Rash 04/24/2019    Family History  Problem Relation Age of Onset  . Diabetes Mother   . Diabetes Brother   .  Hypertension Sister   . Colon cancer Neg Hx   . Heart attack  Neg Hx   . Stroke Neg Hx   . Colon polyps Neg Hx   . Esophageal cancer Neg Hx   . Rectal cancer Neg Hx   . Stomach cancer Neg Hx     Social History   Socioeconomic History  . Marital status: Divorced    Spouse name: Not on file  . Number of children: 2  . Years of education: Not on file  . Highest education level: Not on file  Occupational History  . Occupation: retired  Tobacco Use  . Smoking status: Former Smoker    Packs/day: 2.00    Years: 3.00    Pack years: 6.00    Types: Cigarettes    Quit date: 06/23/1983    Years since quitting: 37.1  . Smokeless tobacco: Never Used  Vaping Use  . Vaping Use: Never used  Substance and Sexual Activity  . Alcohol use: Yes    Alcohol/week: 2.0 standard drinks    Types: 2 Glasses of wine per week    Comment: occ  . Drug use: No  . Sexual activity: Not Currently  Other Topics Concern  . Not on file  Social History Narrative  . Not on file   Social Determinants of Health   Financial Resource Strain: Not on file  Food Insecurity: Not on file  Transportation Needs: Not on file  Physical Activity: Not on file  Stress: Not on file  Social Connections: Not on file  Intimate Partner Violence: Not on file    Review of Systems: All systems reviewed and negative except where noted in HPI.    OBJECTIVE:    Physical Exam: Vital signs in last 24 hours: Temp:  [97.5 F (36.4 C)-98.1 F (36.7 C)] 98 F (36.7 C) (02/25 1303) Pulse Rate:  [59-68] 63 (02/25 1303) Resp:  [16-18] 18 (02/25 1303) BP: (116-170)/(38-69) 146/48 (02/25 1303) SpO2:  [96 %-100 %] 100 % (02/25 1303) Weight:  [82.4 kg-83.4 kg] 82.4 kg (02/25 0400) Last BM Date: 08/14/20 General:   Alert  female in NAD Psych:  Pleasant, cooperative. Normal mood and affect. Eyes:  Pupils equal, sclera clear, no icterus.   Conjunctiva pink. Ears:  Normal auditory acuity. Nose:  No deformity, discharge,  or  lesions. Neck:  Supple; no masses Lungs:  Clear throughout to auscultation.   No wheezes, crackles, or rhonchi.  Heart:  Regular rate and rhythm;  1+ BLE edema Abdomen:  Soft, non-distended, nontender, BS active, no palp mass. Two small abdominal wall hernias near umbilicus  Rectal:  Deferred  Msk:  Symmetrical without gross deformities. . Neurologic:  Alert and  oriented x4;  grossly normal neurologically. Skin:  Intact without significant lesions or rashes.  Filed Weights   08/15/20 2037 08/16/20 0400  Weight: 83.4 kg 82.4 kg     Scheduled inpatient medications . acidophilus  1 capsule Oral Daily  . amiodarone  200 mg Oral Daily  . carvedilol  3.125 mg Oral BID WC  . cholecalciferol  1,000 Units Oral Daily  . dorzolamide  1 drop Right Eye BID  . febuxostat  40 mg Oral Q M,W,F  . ferrous sulfate  325 mg Oral BID  . furosemide  80 mg Intravenous BID  . gabapentin  300 mg Oral Daily  . gabapentin  400 mg Oral QHS  . insulin aspart  0-15 Units Subcutaneous TID WC  . insulin aspart  0-5 Units Subcutaneous QHS  . insulin glargine  5 Units Subcutaneous QHS  . levothyroxine  50  mcg Oral QHS  . montelukast  10 mg Oral QHS  . pantoprazole  40 mg Oral Daily  . pentoxifylline  400 mg Oral Daily  . polyethylene glycol  17 g Oral Daily  . rOPINIRole  0.5 mg Oral QHS  . rosuvastatin  5 mg Oral Daily  . senna-docusate  1 tablet Oral QHS  . sodium chloride flush  3 mL Intravenous Q12H  . traMADol  50 mg Oral QHS      Intake/Output from previous day: 02/24 0701 - 02/25 0700 In: 818 [P.O.:480; Blood:338] Out: 1000 [Urine:1000] Intake/Output this shift: Total I/O In: 342 [Blood:342] Out: 900 [Urine:900]   Lab Results: Recent Labs    08/15/20 2129 08/16/20 0619  WBC 8.0 8.5  HGB 6.3* 5.6*  HCT 20.9* 18.3*  PLT 336 297   BMET Recent Labs    08/15/20 2129 08/16/20 0619  NA 137 139  K 5.4* 4.7  CL 102 101  CO2 27 27  GLUCOSE 270* 152*  BUN 83* 81*  CREATININE  2.18* 2.16*  CALCIUM 9.0 8.8*   LFT Recent Labs    08/15/20 2129  PROT 5.5*  ALBUMIN 3.1*  AST 14*  ALT 10  ALKPHOS 68  BILITOT 0.4  BILIDIR 0.1  IBILI 0.3   PT/INR No results for input(s): LABPROT, INR in the last 72 hours. Hepatitis Panel No results for input(s): HEPBSAG, HCVAB, HEPAIGM, HEPBIGM in the last 72 hours.   . CBC Latest Ref Rng & Units 08/16/2020 08/15/2020 07/31/2020  WBC 4.0 - 10.5 K/uL 8.5 8.0 -  Hemoglobin 12.0 - 15.0 g/dL 5.6(LL) 6.3(LL) 10.5(L)  Hematocrit 36.0 - 46.0 % 18.3(L) 20.9(L) 31.0(L)  Platelets 150 - 400 K/uL 297 336 -    . CMP Latest Ref Rng & Units 08/16/2020 08/15/2020 08/05/2020  Glucose 70 - 99 mg/dL 152(H) 270(H) 159(H)  BUN 8 - 23 mg/dL 81(H) 83(H) 106(HH)  Creatinine 0.44 - 1.00 mg/dL 2.16(H) 2.18(H) 2.19(H)  Sodium 135 - 145 mmol/L 139 137 142  Potassium 3.5 - 5.1 mmol/L 4.7 5.4(H) 4.4  Chloride 98 - 111 mmol/L 101 102 102  CO2 22 - 32 mmol/L _0 Calcium 8.9 - 10.3 mg/dL 8.8(L) 9.0 8.8  Total Protein 6.5 - 8.1 g/dL - 5.5(L) -  Total Bilirubin 0.3 - 1.2 mg/dL - 0.4 -  Alkaline Phos 38 - 126 U/L - 68 -  AST 15 - 41 U/L - 14(L) -  ALT 0 - 44 U/L - 10 -   Studies/Results: Portable chest x-ray 1 view  Result Date: 08/15/2020 CLINICAL DATA:  CHF EXAM: PORTABLE CHEST 1 VIEW COMPARISON:  07/23/2020 FINDINGS: Left-sided pacing device as before. Cardiomegaly with vascular congestion. Aortic atherosclerosis. Trace pleural effusions. No overt pulmonary edema. No pneumothorax. IMPRESSION: Cardiomegaly with vascular congestion and trace pleural effusions. Electronically Signed   By: Donavan Foil M.D.   On: 08/15/2020 21:08    Principal Problem:   Acute diastolic heart failure (HCC) Active Problems:   Junctional bradycardia   Pacemaker   Diabetes mellitus type 2, controlled ([place])   CKD (chronic kidney disease) stage 3, GFR 30-59 ml/min (HCC)   Anemia in chronic kidney disease   Type II diabetes mellitus (HCC)   AKI (acute kidney  injury) ([place])   CHF (congestive heart failure) ([place])    Tye Savoy, NP-C @  08/16/2020, 2:55 PM

## 2020-08-16 NOTE — Procedures (Signed)
**Note De-Identified [place] Obfuscation** Echo attempted. Will attempt again later at request of nurse.

## 2020-08-16 NOTE — Progress Notes (Signed)
2 Unit of PRBCs ordered for a Hgb of 6.3. Pt received first unit and is now refusing second unit. Provider notified.

## 2020-08-16 NOTE — H&P (Addendum)
History and Physical    Tricia Gonzales NLZ:[id number] DOB: 1941/05/27 DOA: 08/15/2020  PCP: Carol Ada, MD (Confirm with patient/family/NH records and if not entered, this has to be entered at [facility] point of entry) Patient coming from: Home  I have personally briefly reviewed patient's old medical records in [place]  Chief Complaint: SOB  HPI: Tricia Gonzales is a 80 y.o. 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  Symptoms started 2-3 weeks ago, with worsening of shortness of breath and increasing peripheral edema.  Patient went to see cardiology about 3 weeks ago was found in uncontrolled A. fib, and subsequently underwent cardioversion.  She denied any abdominal pain, she has chronic black colored stool since she has been on iron pills.  Patient went to see her cardiologist yesterday for worsening shortness of breath.  Cardiologist impression was decompensated CHF patient admitted directly to hospital for IV diuresis.  However work-up found hemoglobin 6.3 compared to her baseline around 10, patient was transfused 2 units of packed RBC last night and Eliquis has been held.  However this morning repeat CBC showed hemoglobin 5.6.  Review of Systems: As per HPI otherwise 14 point review of systems negative.    Past Medical History:  Diagnosis Date  . Anemia   . 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 Anxiety   . Arthritis   . Atrial fibrillation ([place])   . Avascular necrosis (HCC) hip left   leg pain also  . Chronic back pain   . Chronic kidney disease (CKD), stage III (moderate) (HCC)   . Depression   . Diverticulosis   . Early cataracts, bilateral   . Esophageal stricture   . Gastritis   . GERD (gastroesophageal reflux disease)   . Glaucoma   . Heart murmur     "  some doctors say that I have one some say that I don"t "  . Hiatal hernia   . History of blood transfusion    "@ least w/1st knee OR"  . History of gout   . History of kidney stones   . Hyperlipidemia   . Hypertension   . Intestinal obstruction ([place])   . Kidney stones   . Obesity   . Osteoarthritis   . Osteoporosis   . Pericarditis 2016  . Presence of permanent cardiac pacemaker   . Sjogren's disease ([place])   . Thyroid disease   . Type II diabetes mellitus ([place])   . Vitamin B12 deficiency     Past Surgical History:  Procedure Laterality Date  . ABDOMINAL HYSTERECTOMY     complete  . CARDIOVERSION N/A 07/31/2020   Procedure: CARDIOVERSION;  Surgeon: Werner Lean, MD;  Location: [facility];  Service: Cardiovascular;  Laterality: N/A;  . CHOLECYSTECTOMY OPEN    . COLECTOMY     for rectovaginal fistula  . COLONOSCOPY  01/07/2012   Procedure: COLONOSCOPY;  Surgeon: Lafayette Dragon, MD;  Location: [facility];  Service: Endoscopy;  Laterality: N/A;  . EP IMPLANTABLE DEVICE N/A 06/18/2015   Procedure: Pacemaker Implant;  Surgeon: Evans Lance, MD;  Location: [place] [facility];  Service: Cardiovascular;  Laterality: N/A;  . ESOPHAGOGASTRODUODENOSCOPY N/A 11/24/2018   Procedure: ESOPHAGOGASTRODUODENOSCOPY (EGD);  Surgeon: Yetta Flock, MD;  Location: [facility];  Service: Gastroenterology;  Laterality:  N/A;  . ESOPHAGOGASTRODUODENOSCOPY (EGD) WITH ESOPHAGEAL DILATION    . ESOPHAGOGASTRODUODENOSCOPY (EGD) WITH PROPOFOL N/A 08/12/2015   Procedure: ESOPHAGOGASTRODUODENOSCOPY (EGD) WITH PROPOFOL;  Surgeon: Manus Gunning, MD;  Location: [place];  Service: Gastroenterology;  Laterality: N/A;  . EYE SURGERY Bilateral    cataracts  . HOT HEMOSTASIS N/A 11/24/2018   Procedure: HOT HEMOSTASIS (ARGON PLASMA COAGULATION/BICAP);  Surgeon: Yetta Flock, MD;  Location: [facility];  Service: Gastroenterology;  Laterality: N/A;  . I & D EXTREMITY Right  08/06/2016   Procedure: DEBRIDEMENT PIP RIGHT RING FINGER;  Surgeon: Daryll Brod, MD;  Location: [place];  Service: Orthopedics;  Laterality: Right;  . INSERT / REPLACE / REMOVE PACEMAKER    . JOINT REPLACEMENT    . KNEE ARTHROSCOPY Right   . MASS EXCISION Right 08/06/2016   Procedure: EXCISION CYST;  Surgeon: Daryll Brod, MD;  Location: [place];  Service: Orthopedics;  Laterality: Right;  . REVISION TOTAL KNEE ARTHROPLASTY Left   . TOTAL KNEE ARTHROPLASTY Bilateral      reports that she quit smoking about 37 years ago. Her smoking use included cigarettes. She has a 6.00 pack-year smoking history. She has never used smokeless tobacco. She reports current alcohol use of about 2.0 standard drinks of alcohol per week. She reports that she does not use drugs.  Allergies  Allergen Reactions  . Norvasc [Amlodipine Besylate] Shortness Of Breath  . Other Rash and Other (See Comments)    Pt voiced she is allergic to microfiber materials/ blankets/sheets!!  . Avapro [Irbesartan] Other (See Comments)    Headaches  . Glucophage [Metformin Hydrochloride] Other (See Comments)    "increases creatinine"  . Lisinopril     Headache   . Losartan Other (See Comments)    Headaches   . Morphine And Related Other (See Comments)    "Hallucinations"  . Simvastatin Other (See Comments)    Body aches  . Trulicity [Dulaglutide] Other (See Comments)    Severe mood swings    Family History  Problem Relation Age of Onset  . Diabetes Mother   . Diabetes Brother   . Hypertension Sister   . Colon cancer Neg Hx   . Heart attack Neg Hx   . Stroke Neg Hx   . Colon polyps Neg Hx   . Esophageal cancer Neg Hx   . Rectal cancer Neg Hx   . Stomach cancer Neg Hx      Prior to Admission medications   Medication Sig Start Date End Date Taking? Authorizing Provider  acetaminophen (TYLENOL) 650 MG CR tablet Take 1,300 mg by mouth every evening.   Yes [provider]  amiodarone (PACERONE) 200 MG tablet  Take 1 tablet (200 mg total) by mouth daily. 07/19/20  Yes O'Neal, Cassie Freer, MD  carvedilol (COREG) 3.125 MG tablet Take 2 tablets in the AM and 1 tablet in the PM Patient taking differently: Take 3.125-6.25 mg by mouth See admin instructions. Take 6.25 mg by mouth in the morning and 3.125 mg at bedtime 07/11/20  Yes Sherran Needs, NP  Cholecalciferol (VITAMIN D3) 25 MCG (1000 UT) CAPS Take 1,000 Units by mouth daily.   Yes [provider]  dorzolamide (TRUSOPT) 2 % ophthalmic solution Place 1 drop into the right eye 2 (two) times daily. 05/12/19  Yes Lassen, Arlo C, PA-C  ELIQUIS 5 MG TABS tablet TAKE ONE TABLET TWICE DAILY Patient taking differently: Take 5 mg by mouth 2 (two) times daily. 03/21/20  Yes Cristopher Peru  W, MD  febuxostat (ULORIC) 40 MG tablet Take 1 tablet (40 mg total) by mouth every Monday, Wednesday, and Friday. 05/12/19  Yes Lassen, Arlo C, PA-C  ferrous sulfate 325 (65 FE) MG EC tablet Take 1 tablet (325 mg total) by mouth 2 (two) times daily. 09/27/15  Yes Ladell Pier, MD  furosemide (LASIX) 40 MG tablet Take 1 tablet (40 mg total) by mouth daily. 07/19/20  Yes O'Neal, Cassie Freer, MD  gabapentin (NEURONTIN) 300 MG capsule Take 1 capsule (300 mg total) by mouth daily. Patient taking differently: Take 300 mg by mouth in the morning. 05/12/19  Yes Lassen, Arlo C, PA-C  gabapentin (NEURONTIN) 400 MG capsule Take 1 capsule (400 mg total) by mouth at bedtime. 05/12/19  Yes Lassen, Arlo C, PA-C  JANUVIA 25 MG tablet Take 25 mg by mouth daily. 08/13/20  Yes [provider]  levothyroxine (SYNTHROID) 50 MCG tablet Take 1 tablet (50 mcg total) by mouth at bedtime. 05/12/19  Yes Lassen, Arlo C, PA-C  lidocaine (LIDODERM) 5 % Place 1-3 patches onto the skin See admin instructions. Apply 1-3 patches transdermally every 24 hours as needed for pain and remove & discard patches within 12 hours or as directed by MD   Yes [provider]  montelukast  (SINGULAIR) 10 MG tablet Take 1 tablet (10 mg total) by mouth at bedtime. Patient taking differently: Take 10 mg by mouth every evening. 05/12/19  Yes Lassen, Arlo C, PA-C  nystatin ([place]) powder APPLY TOPICALLY TO LEFT ABDOMINAL FOLD AND GROIN TWICE A DAY Patient taking differently: Apply 1 application topically daily as needed (for rashes- abdominal folds and groin area). 05/12/19  Yes Lassen, Arlo C, PA-C  pantoprazole (PROTONIX) 40 MG tablet Take 1 tablet (40 mg total) by mouth daily. Patient taking differently: Take 40 mg by mouth daily before breakfast. 05/12/19  Yes Oscar La, Arlo C, PA-C  pentoxifylline (TRENTAL) 400 MG CR tablet Take 1 tablet (400 mg total) by mouth daily. 05/12/19  Yes Oscar La, Arlo C, PA-C  Polyethylene Glycol 3350 (MIRALAX PO) Take 17 g by mouth See admin instructions. Mix 17 grams of powder into a desired beverage and drink every morning   Yes [provider]  Probiotic Product (PROBIOTIC DAILY PO) Take 1 capsule by mouth daily.   Yes [provider]  rOPINIRole (REQUIP) 0.5 MG tablet Take 0.5 mg by mouth at bedtime. 02/23/20  Yes [provider]  rosuvastatin (CRESTOR) 5 MG tablet Take 1 tablet (5 mg total) by mouth daily. Patient taking differently: Take 5 mg by mouth every evening. 05/12/19  Yes Lassen, Arlo C, PA-C  senna-docusate (SENOKOT-S) 8.6-50 MG tablet Take 1 tablet by mouth at bedtime.   Yes [provider]  traMADol (ULTRAM) 50 MG tablet Take 50 mg by mouth See admin instructions. Take 50 mg by mouth at bedtime and an additional 50 mg once a day as needed for pain 02/13/20  Yes [provider]  TRESIBA FLEXTOUCH 100 UNIT/ML FlexTouch Pen Inject 10 Units into the skin every evening. 02/21/20  Yes [provider]  ACCU-CHEK AVIVA PLUS test strip 1 each by Other route daily.  03/15/15   [provider]  BD PEN NEEDLE NANO U/F 32G X 4 MM MISC See admin instructions. 12/25/19   [provider]   JANUVIA 50 MG tablet Take 1 tablet (50 mg total) by mouth daily. Patient not taking: No sig reported 05/12/19   Wille Celeste, PA-C    Physical Exam:  Vitals:   08/16/20 0921 08/16/20 0933 08/16/20 1200 08/16/20 1303  BP:  (!) 132/47 (!) 154/69 (!) 146/48  Pulse: (!) 59 60 61 63  Resp:  _0 Temp: 97.6 F (36.4 C) (!) 97.5 F (36.4 C) 97.9 F (36.6 C) 98 F (36.7 C)  TempSrc: Oral Oral Oral Oral  SpO2:  96% 97% 100%  Weight:      Height:        Constitutional: NAD, calm, comfortable Vitals:   08/16/20 0921 08/16/20 0933 08/16/20 1200 08/16/20 1303  BP:  (!) 132/47 (!) 154/69 (!) 146/48  Pulse: (!) 59 60 61 63  Resp:  _1 Temp: 97.6 F (36.4 C) (!) 97.5 F (36.4 C) 97.9 F (36.6 C) 98 F (36.7 C)  TempSrc: Oral Oral Oral Oral  SpO2:  96% 97% 100%  Weight:      Height:       Eyes: PERRL, lids and conjunctivae normal. Pale looking ENMT: Mucous membranes are moist. Posterior pharynx clear of any exudate or lesions.Normal dentition.  Neck: normal, supple, no masses, no thyromegaly Respiratory: clear to auscultation bilaterally, no wheezing, fine crackles on B/L bases. Increasing respiratory effort. No accessory muscle use.  Cardiovascular: Regular rate and rhythm, no murmurs / rubs / gallops. 2+ extremity edema. 2+ pedal pulses. No carotid bruits.  Abdomen: no tenderness, no masses palpated. No hepatosplenomegaly. Bowel sounds positive.  Musculoskeletal: no clubbing / cyanosis. No joint deformity upper and lower extremities. Good ROM, no contractures. Normal muscle tone.  Skin: no rashes, lesions, ulcers. No induration Neurologic: CN 2-12 grossly intact. Sensation intact, DTR normal. Strength 5/5 in all 4.  Psychiatric: Normal judgment and insight. Alert and oriented x 3. Normal mood.     Labs on Admission: I have personally reviewed following labs and imaging studies  CBC: Recent Labs  Lab 08/15/20 2129 08/16/20 0619  WBC 8.0 8.5  NEUTROABS 6.7  --    HGB 6.3* 5.6*  HCT 20.9* 18.3*  MCV 103.5* 101.1*  PLT 336 161   Basic Metabolic Panel: Recent Labs  Lab 08/15/20 2129 08/16/20 0619  NA 137 139  K 5.4* 4.7  CL 102 101  CO2 27 27  GLUCOSE 270* 152*  BUN 83* 81*  CREATININE 2.18* 2.16*  CALCIUM 9.0 8.8*   GFR: Estimated Creatinine Clearance: 19.2 mL/min (A) (by C-G formula based on SCr of 2.16 mg/dL (H)). Liver Function Tests: Recent Labs  Lab 08/15/20 2129  AST 14*  ALT 10  ALKPHOS 68  BILITOT 0.4  PROT 5.5*  ALBUMIN 3.1*   No results for input(s): LIPASE, AMYLASE in the last 168 hours. No results for input(s): AMMONIA in the last 168 hours. Coagulation Profile: No results for input(s): INR, PROTIME in the last 168 hours. Cardiac Enzymes: No results for input(s): CKTOTAL, CKMB, CKMBINDEX, TROPONINI in the last 168 hours. BNP (last 3 results) No results for input(s): PROBNP in the last 8760 hours. HbA1C: Recent Labs    08/15/20 2129  HGBA1C 6.0*   CBG: Recent Labs  Lab 08/15/20 2145 08/16/20 0626 08/16/20 1231  GLUCAP 258* 139* 253*   Lipid Profile: No results for input(s): CHOL, HDL, LDLCALC, TRIG, CHOLHDL, LDLDIRECT in the last 72 hours. Thyroid Function Tests: Recent Labs    08/15/20 2129  TSH 5.867*   Anemia Panel: No results for input(s): VITAMINB12, FOLATE, FERRITIN, TIBC, IRON, RETICCTPCT in the last 72 hours. Urine analysis:    Component Value Date/Time   COLORURINE YELLOW 08/09/2015  [place] 08/09/2015 0556   LABSPEC 1.017 08/09/2015 0556   PHURINE 5.0 08/09/2015 0556   GLUCOSEU NEGATIVE 08/09/2015 0556   HGBUR NEGATIVE 08/09/2015 0556   BILIRUBINUR NEGATIVE 08/09/2015 0556   KETONESUR NEGATIVE 08/09/2015 0556   PROTEINUR NEGATIVE 08/09/2015 0556   UROBILINOGEN 0.2 03/11/2010 0916   NITRITE NEGATIVE 08/09/2015 0556   LEUKOCYTESUR SMALL (A) 08/09/2015 0556    Radiological Exams on Admission: Portable chest x-ray 1 view  Result Date: 08/15/2020 CLINICAL  DATA:  CHF EXAM: PORTABLE CHEST 1 VIEW COMPARISON:  07/23/2020 FINDINGS: Left-sided pacing device as before. Cardiomegaly with vascular congestion. Aortic atherosclerosis. Trace pleural effusions. No overt pulmonary edema. No pneumothorax. IMPRESSION: Cardiomegaly with vascular congestion and trace pleural effusions. Electronically Signed   By: Donavan Foil M.D.   On: 08/15/2020 21:08   ECHOCARDIOGRAM LIMITED  Result Date: 08/16/2020    ECHOCARDIOGRAM LIMITED REPORT   Patient Name:   Tricia Gonzales Date of Exam: 08/16/2020 Medical Rec #:  [id number]     Height:       58.0 in Accession #:    [id number]    Weight:       181.7 lb Date of Birth:  05-23-1941     BSA:          1.748 m Patient Age:    23 years      BP:           135/38 mmHg Patient Gender: F             HR:           68 bpm. Exam Location:  Inpatient Procedure: 2D Echo, Cardiac Doppler and Color Doppler Indications:    CHF-Acute Diastolic  History:        Patient has prior history of Echocardiogram examinations.                 Pacemaker, Arrythmias:Atrial Fibrillation,                 Signs/Symptoms:Shortness of Breath; Risk Factors:Diabetes,                 Hypertension and Dyslipidemia. CKD.  Sonographer:    Clayton Lefort RDCS (AE) Referring Phys: [place]  1. Left ventricular ejection fraction, by estimation, is 50 to 55%. The left ventricle has low normal function. The left ventricle has no regional wall motion abnormalities.  2. Right ventricular systolic function is normal. The right ventricular size is normal. There is moderately elevated pulmonary artery systolic pressure.  3. The mitral valve is normal in structure. Moderate to severe mitral valve regurgitation. No evidence of mitral stenosis. Moderate mitral annular calcification.  4. The aortic valve is normal in structure. Aortic valve regurgitation is not visualized. No aortic stenosis is present.  5. The inferior vena cava is dilated in size with <50% respiratory  variability, suggesting right atrial pressure of 15 mmHg. FINDINGS  Left Ventricle: Left ventricular ejection fraction, by estimation, is 50 to 55%. The left ventricle has low normal function. The left ventricle has no regional wall motion abnormalities. The left ventricular internal cavity size was normal in size. There is no left ventricular hypertrophy.  LV Wall Scoring: The apex is akinetic. Right Ventricle: The right ventricular size is normal. No increase in right ventricular wall thickness. Right ventricular systolic function is normal. There is moderately elevated pulmonary artery systolic pressure. The tricuspid regurgitant velocity is 3.07 m/s, and with an assumed right atrial  pressure of 15 mmHg, the estimated right ventricular systolic pressure is 20.2 mmHg. Left Atrium: Left atrial size was normal in size. Right Atrium: Right atrial size was normal in size. Pericardium: There is no evidence of pericardial effusion. Mitral Valve: The mitral valve is normal in structure. There is moderate thickening of the mitral valve leaflet(s). There is moderate calcification of the mitral valve leaflet(s). Moderate mitral annular calcification. Moderate to severe mitral valve regurgitation. No evidence of mitral valve stenosis. Tricuspid Valve: The tricuspid valve is normal in structure. Tricuspid valve regurgitation is not demonstrated. No evidence of tricuspid stenosis. Aortic Valve: The aortic valve is normal in structure. Aortic valve regurgitation is not visualized. No aortic stenosis is present. Pulmonic Valve: The pulmonic valve was normal in structure. Pulmonic valve regurgitation is not visualized. No evidence of pulmonic stenosis. Aorta: The aortic root is normal in size and structure. Venous: The inferior vena cava is dilated in size with less than 50% respiratory variability, suggesting right atrial pressure of 15 mmHg. IAS/Shunts: No atrial level shunt detected by color flow Doppler. Additional Comments:  A pacer wire is visualized. LEFT VENTRICLE PLAX 2D LVIDd:         5.30 cm LVIDs:         3.80 cm LV PW:         1.60 cm LV IVS:        1.10 cm LVOT diam:     2.00 cm LVOT Area:     3.14 cm  LV Volumes (MOD) LV vol d, MOD A2C: 120.0 ml LV vol d, MOD A4C: 134.0 ml LV vol s, MOD A2C: 56.8 ml LV vol s, MOD A4C: 66.6 ml LV SV MOD A2C:     63.2 ml LV SV MOD A4C:     134.0 ml LV SV MOD BP:      64.6 ml IVC IVC diam: 2.40 cm LEFT ATRIUM         Index LA diam:    5.20 cm 2.97 cm/m   AORTA Ao Root diam: 3.50 cm Ao Asc diam:  2.80 cm TRICUSPID VALVE TR Peak grad:   37.7 mmHg TR Vmax:        307.00 cm/s  SHUNTS Systemic Diam: 2.00 cm Candee Furbish MD Electronically signed by Candee Furbish MD Signature Date/Time: 08/16/2020/3:09:53 PM    Final     EKG: Independently reviewed. V paced.  Assessment/Plan Principal Problem:   Acute diastolic heart failure (HCC) Active Problems:   Junctional bradycardia   Pacemaker   Diabetes mellitus type 2, controlled (HCC)   CKD (chronic kidney disease) stage 3, GFR 30-59 ml/min (HCC)   Anemia in chronic kidney disease   Type II diabetes mellitus (HCC)   AKI (acute kidney injury) ([place])   CHF (congestive heart failure) ([place])  (please populate well all problems here in Problem List. (For example, if patient is on BP meds at home and you resume or decide to hold them, it is a problem that needs to be her. Same for CAD, COPD, HLD and so on)  Acute on chronic anemia, symptomatic -Most recent iron study January showed a borderline low iron saturation. -No bowel movement yet. -Suspect this is recurrent gastric AVM bleed, increase PPI to twice daily. -GI consulted. -Transfuse 3 unit of PRBC and 1 unit of FFP, and continue to hold Eliquis -Consider CTA if pt becomes hemodynamically unstable -Patient sick with worsening of anemia and CHF decompensation, discuss with cardio attending, agreed with transferring patient to hospital  service.  Acute on chronic diastolic CHF  decompensation -Continue Lasix 80 mg twice daily  History of moderate to severe pulmonary hypertension -Continue aggressive diuresis.  IDDM -Fairly controlled, continue current insulin regimen.  HLD -Statin  Hypothyroid -TSH borderline high, continue current dose of synthroid and repeat TSH in 2-3 months. Will not increase doses due to recent uncontrolled a-fib.  PAF -Rate controlled -Continue to hold Eliquis.  DVT prophylaxis: SCD Code Status: Full Code Family Communication: Daughter at bedside Disposition Plan: Expect more than 2 midnight hospital stay for GI workup and CHF treatment. Consults called: GI, Cardio Admission status: PCU   Lequita Halt MD [facility] Pager [phone]  08/16/2020, 3:16 PM

## 2020-08-16 NOTE — Progress Notes (Signed)
  Echocardiogram 2D Echocardiogram has been performed.  Tricia Gonzales 08/16/2020, 1:47 PM

## 2020-08-16 NOTE — Plan of Care (Signed)
  Problem: Education: Goal: Knowledge of General Education information will improve Description: Including pain rating scale, medication(s)/side effects and non-pharmacologic comfort measures Outcome: Progressing   Problem: Education: Goal: Ability to demonstrate management of disease process will improve Outcome: Progressing

## 2020-08-16 NOTE — H&P (View-Only) (Signed)
Attending physician's note   I have taken an interval history, reviewed the chart and examined the patient. I agree with the Advanced Practitioner's note, impression, and recommendations as outlined.  80 year old female with medical history as outlined below, including history of atrial fibrillation (on Eliquis) decompensated CHF, CKD, chronic anemia, and GAVE, presents with acute on chronic symptomatic anemia (SOB, weakness).  Known history of GAVE with APC in 11/2018.  Had otherwise been doing well from an anemia standpoint, treated with oral iron and Procrit, maintaining baseline Hgb ~10.  Stools are chronically dark due to iron.  Admission evaluation notable for the following: -H/H 6.3/21, decreased to 5.6/18.  MCV 103 transfused 2 units with repeat H/H pending -BUN/creatinine 83/2.2  1) History of gastric antral vascular ectasia 2) Acute on chronic macrocytic anemia 3) History of atrial fibrillation 4) Systemic anticoagulation 5) CKD 6) CHF 7) GERD -Holding Eliquis -Started on high-dose PPI -N.p.o. at midnight -EGD tomorrow for diagnostic and therapeutic intent -Posttransfusion H/H check and additional blood products as needed per protocol -Check B12 and folate -I discussed the risks, benefits, alternatives of EGD with the patient and her family member at bedside, and she wishes to proceed  Tricia Heck, DO, FACG [phone] office           Referring Provider:  [facility]         Primary Care Physician:  Tricia Ada, MD Primary Gastroenterologist:  Tricia Cellar, MD           We were asked to see this patient for:   anemia               ASSESSMENT / PLAN:   # 80 yo female admitted for suspected acute decompensated CHF but labs reveal acute on chronic anemia with hgb of 5.6.she is on eliquis. Stools dark on oral iron. Suspect chronic GI blood loss related to GAVE. She is followed outpatient by Hematology for anemia and gets iron infusions,  Epogen  and is on oral iron  --She  needs an EGD with probable APC. Last dose of Eliquis was yesterday. Will plan for EGD tomorrow. The risks and benefits of EGD were discussed and the patient agrees to proceed.  --She has received 2 units of blood, follow up H+H pending.  --NPO after MN  # GERD, on PPI for years. Complains of frequent heartburn over last several weeks.  --Further evaluation at time of EGD. She may need to increase PPI dose  # Diverticular disease. Remote history of diverticular abscess requiring resection    # Hx of small serrated colon polyp May 2020  # See other medical problems listed below  HPI:                                                                                                                             Chief Complaint: shortness of breath, anemia  Tricia Gonzales is a 80 y.o. female with history of CKD,  PAF s/p DCCV 07/31/20, junctional bradycardia s/p PPM, DM2, HTN, gastric antral vascular ectasia, chronic anemia, diverticular abscess s/p remote segmental resection, colon polyps   [name] was admitted with fatigue and shob felt to be secondary to heart failure. However, hgb was 6.3, down 3 -4 grams compared to a few weeks ago. Her stools are dark on oral iron. She hasn't noticed any bowel changes. No rectal bleeding. She doesn't take NSAIDs. She had been becoming increasingly shob and weak since Christmas. No nausea / vomiting. She has very transient, intermittent postprandial LUQ pain radiating towards epigastrium but no other abdominal pain. She has been on a PPI for years but endorses heartburn over the last several weeks.   PREVIOUS ENDOSCOPIC EVALUATIONS / [place] STUDIES   June 2020 EGD for treatment of GAVE -Normal esophagus. 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 Treated with argon plasma coagulation (APC). - Normal duodenal bulb and second portion of the duodenum.  May 2020 colonoscopy for anemia  and heme positive stool.  -Diverticulosis in the entire examined colon. - End-to-end colo-colonic anastomosis, characterized by visible sutures. - One 5 mm polyp in the ascending colon, removed with a cold snare. Resected and retrieved. - Internal hemorrhoids. - The examination was otherwise normal.  Path = sessile serrated polyp  Past Medical History:  Diagnosis Date  . Anemia   . 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 Anxiety   . Arthritis   . Atrial fibrillation ([place])   . Avascular necrosis (HCC) hip left   leg pain also  . Chronic back pain   . Chronic kidney disease (CKD), stage III (moderate) (HCC)   . Depression   . Diverticulosis   . Early cataracts, bilateral   . Esophageal stricture   . Gastritis   . GERD (gastroesophageal reflux disease)   . Glaucoma   . Heart murmur     " some doctors say that I have one some say that I don"t "  . Hiatal hernia   . History of blood transfusion    "@ least w/1st knee OR"  . History of gout   . History of kidney stones   . Hyperlipidemia   . Hypertension   . Intestinal obstruction ([place])   . Kidney stones   . Obesity   . Osteoarthritis   . Osteoporosis   . Pericarditis 2016  . Presence of permanent cardiac pacemaker   . Sjogren's disease ([place])   . Thyroid disease   . Type II diabetes mellitus ([place])   . Vitamin B12 deficiency     Past Surgical History:  Procedure Laterality Date  . ABDOMINAL HYSTERECTOMY     complete  . CARDIOVERSION N/A 07/31/2020   Procedure: CARDIOVERSION;  Surgeon: Werner Lean, MD;  [facility];  Service: Cardiovascular;  Laterality: N/A;  . CHOLECYSTECTOMY OPEN    . COLECTOMY     for rectovaginal fistula  . COLONOSCOPY  01/07/2012   Procedure: COLONOSCOPY;  Surgeon: Lafayette Dragon, MD;  Location: [facility];  Service: Endoscopy;  Laterality: N/A;  . EP IMPLANTABLE DEVICE N/A 06/18/2015   Procedure: Pacemaker Implant;  Surgeon: Evans Lance, MD;  Location: [facility];  Service: Cardiovascular;  Laterality: N/A;  . ESOPHAGOGASTRODUODENOSCOPY N/A 11/24/2018   Procedure: ESOPHAGOGASTRODUODENOSCOPY (EGD);  Surgeon: Yetta Flock, MD;  Location: [facility];  Service: Gastroenterology;  Laterality: N/A;  . ESOPHAGOGASTRODUODENOSCOPY (EGD) WITH ESOPHAGEAL DILATION    . ESOPHAGOGASTRODUODENOSCOPY (  EGD) WITH PROPOFOL N/A 08/12/2015   Procedure: ESOPHAGOGASTRODUODENOSCOPY (EGD) WITH PROPOFOL;  Surgeon: Manus Gunning, MD;  Location: [facility];  Service: Gastroenterology;  Laterality: N/A;  . EYE SURGERY Bilateral    cataracts  . HOT HEMOSTASIS N/A 11/24/2018   Procedure: HOT HEMOSTASIS (ARGON PLASMA COAGULATION/BICAP);  Surgeon: Yetta Flock, MD;  Location: [facility];  Service: Gastroenterology;  Laterality: N/A;  . I & D EXTREMITY Right 08/06/2016   Procedure: DEBRIDEMENT PIP RIGHT RING FINGER;  Surgeon: Daryll Brod, MD;  Location: [facility];  Service: Orthopedics;  Laterality: Right;  . INSERT / REPLACE / REMOVE PACEMAKER    . JOINT REPLACEMENT    . KNEE ARTHROSCOPY Right   . MASS EXCISION Right 08/06/2016   Procedure: EXCISION CYST;  Surgeon: Daryll Brod, MD;  [facility];  Service: Orthopedics;  Laterality: Right;  . REVISION TOTAL KNEE ARTHROPLASTY Left   . TOTAL KNEE ARTHROPLASTY Bilateral     Prior to Admission medications   Medication Sig Start Date End Date Taking? Authorizing Provider  acetaminophen (TYLENOL) 650 MG CR tablet Take 1,300 mg by mouth every evening.   Yes [provider]  amiodarone (PACERONE) 200 MG tablet Take 1 tablet (200 mg total) by mouth daily. 07/19/20  Yes O'Neal, Cassie Freer, MD  carvedilol (COREG) 3.125 MG tablet Take 2 tablets in the AM and 1 tablet in the PM Patient taking differently: Take 3.125-6.25 mg by mouth See admin instructions. Take 6.25 mg by mouth in the morning and 3.125 mg at bedtime 07/11/20  Yes Sherran Needs, NP  Cholecalciferol (VITAMIN D3) 25 MCG (1000 UT)  CAPS Take 1,000 Units by mouth daily.   Yes [provider]  dorzolamide (TRUSOPT) 2 % ophthalmic solution Place 1 drop into the right eye 2 (two) times daily. 05/12/19  Yes Lassen, Arlo C, PA-C  ELIQUIS 5 MG TABS tablet TAKE ONE TABLET TWICE DAILY Patient taking differently: Take 5 mg by mouth 2 (two) times daily. 03/21/20  Yes Evans Lance, MD  febuxostat (ULORIC) 40 MG tablet Take 1 tablet (40 mg total) by mouth every Monday, Wednesday, and Friday. 05/12/19  Yes Lassen, Arlo C, PA-C  ferrous sulfate 325 (65 FE) MG EC tablet Take 1 tablet (325 mg total) by mouth 2 (two) times daily. 09/27/15  Yes Ladell Pier, MD  furosemide (LASIX) 40 MG tablet Take 1 tablet (40 mg total) by mouth daily. 07/19/20  Yes O'Neal, Cassie Freer, MD  gabapentin (NEURONTIN) 300 MG capsule Take 1 capsule (300 mg total) by mouth daily. Patient taking differently: Take 300 mg by mouth in the morning. 05/12/19  Yes Lassen, Arlo C, PA-C  gabapentin (NEURONTIN) 400 MG capsule Take 1 capsule (400 mg total) by mouth at bedtime. 05/12/19  Yes Lassen, Arlo C, PA-C  JANUVIA 25 MG tablet Take 25 mg by mouth daily. 08/13/20  Yes [provider]  levothyroxine (SYNTHROID) 50 MCG tablet Take 1 tablet (50 mcg total) by mouth at bedtime. 05/12/19  Yes Lassen, Arlo C, PA-C  lidocaine (LIDODERM) 5 % Place 1-3 patches onto the skin See admin instructions. Apply 1-3 patches transdermally every 24 hours as needed for pain and remove & discard patches within 12 hours or as directed by MD   Yes [provider]  montelukast (SINGULAIR) 10 MG tablet Take 1 tablet (10 mg total) by mouth at bedtime. Patient taking differently: Take 10 mg by mouth every evening. 05/12/19  Yes Lassen, Arlo C, PA-C  nystatin [facility]) powder  APPLY TOPICALLY TO LEFT ABDOMINAL FOLD AND GROIN TWICE A DAY Patient taking differently: Apply 1 application topically daily as needed (for rashes- abdominal folds and groin area). 05/12/19  Yes Lassen,  Arlo C, PA-C  pantoprazole (PROTONIX) 40 MG tablet Take 1 tablet (40 mg total) by mouth daily. Patient taking differently: Take 40 mg by mouth daily before breakfast. 05/12/19  Yes Oscar La, Arlo C, PA-C  pentoxifylline (TRENTAL) 400 MG CR tablet Take 1 tablet (400 mg total) by mouth daily. 05/12/19  Yes Oscar La, Arlo C, PA-C  Polyethylene Glycol 3350 (MIRALAX PO) Take 17 g by mouth See admin instructions. Mix 17 grams of powder into a desired beverage and drink every morning   Yes [provider]  Probiotic Product (PROBIOTIC DAILY PO) Take 1 capsule by mouth daily.   Yes [provider]  rOPINIRole (REQUIP) 0.5 MG tablet Take 0.5 mg by mouth at bedtime. 02/23/20  Yes [provider]  rosuvastatin (CRESTOR) 5 MG tablet Take 1 tablet (5 mg total) by mouth daily. Patient taking differently: Take 5 mg by mouth every evening. 05/12/19  Yes Lassen, Arlo C, PA-C  senna-docusate (SENOKOT-S) 8.6-50 MG tablet Take 1 tablet by mouth at bedtime.   Yes [provider]  traMADol (ULTRAM) 50 MG tablet Take 50 mg by mouth See admin instructions. Take 50 mg by mouth at bedtime and an additional 50 mg once a day as needed for pain 02/13/20  Yes [provider]  TRESIBA FLEXTOUCH 100 UNIT/ML FlexTouch Pen Inject 10 Units into the skin every evening. 02/21/20  Yes [provider]  ACCU-CHEK AVIVA PLUS test strip 1 each by Other route daily.  03/15/15   [provider]  BD PEN NEEDLE NANO U/F 32G X 4 MM MISC See admin instructions. 12/25/19   [provider]  JANUVIA 50 MG tablet Take 1 tablet (50 mg total) by mouth daily. Patient not taking: No sig reported 05/12/19   Wille Celeste, PA-C    Current Facility-Administered Medications  Medication Dose Route Frequency Provider Last Rate Last Admin  . 0.9 %  sodium chloride infusion  250 mL Intravenous PRN Barrett, Rhonda G, PA-C      . acetaminophen (TYLENOL) tablet 650 mg  650 mg Oral Q4H PRN Barrett,  Evelene Croon, PA-C   650 mg at 08/16/20 0837  . acidophilus (RISAQUAD) capsule 1 capsule  1 capsule Oral Daily Barrett, Rhonda G, PA-C   1 capsule at 08/16/20 1222  . ALPRAZolam Duanne Moron) tablet 0.25 mg  0.25 mg Oral BID PRN Barrett, Evelene Croon, PA-C   0.25 mg at 08/16/20 0246  . amiodarone (PACERONE) tablet 200 mg  200 mg Oral Daily Barrett, Rhonda G, PA-C   200 mg at 08/16/20 1219  . carvedilol (COREG) tablet 3.125 mg  3.125 mg Oral BID WC Barrett, Rhonda G, PA-C   3.125 mg at 08/16/20 1220  . cholecalciferol (VITAMIN D3) tablet 1,000 Units  1,000 Units Oral Daily Barrett, Rhonda G, PA-C   1,000 Units at 08/16/20 1222  . dorzolamide (TRUSOPT) 2 % ophthalmic solution 1 drop  1 drop Right Eye BID Barrett, Evelene Croon, PA-C   1 drop at 08/16/20 0839  . febuxostat (ULORIC) tablet 40 mg  40 mg Oral Q M,W,F Barrett, Rhonda G, PA-C   40 mg at 08/16/20 1219  . ferrous sulfate tablet 325 mg  325 mg Oral BID Barrett, Rhonda G, PA-C   325 mg at 08/16/20 1220  . furosemide (LASIX) injection 80 mg  80 mg Intravenous BID Barrett, Evelene Croon, PA-C   80 mg at 08/16/20 1221  . gabapentin (NEURONTIN) capsule 300 mg  300 mg Oral Daily O'Neal, Cassie Freer, MD   300 mg at 08/16/20 1220  . gabapentin (NEURONTIN) capsule 400 mg  400 mg Oral QHS Barrett, Rhonda G, PA-C   400 mg at 08/15/20 2249  . insulin aspart (novoLOG) injection 0-15 Units  0-15 Units Subcutaneous TID WC Barrett, Rhonda G, PA-C   3 Units at 08/16/20 1241  . insulin aspart (novoLOG) injection 0-5 Units  0-5 Units Subcutaneous QHS Barrett, Rhonda G, PA-C   3 Units at 08/15/20 2249  . insulin glargine (LANTUS) injection 5 Units  5 Units Subcutaneous QHS Barrett, Rhonda G, PA-C      . levothyroxine (SYNTHROID) tablet 50 mcg  50 mcg Oral QHS Barrett, Rhonda G, PA-C   50 mcg at 08/15/20 2249  . montelukast (SINGULAIR) tablet 10 mg  10 mg Oral QHS Barrett, Rhonda G, PA-C   10 mg at 08/15/20 2249  . nitroGLYCERIN (NITROSTAT) SL tablet 0.4 mg  0.4 mg Sublingual Q5 Min x  3 PRN Barrett, Rhonda G, PA-C      . nystatin (MYCOSTATIN/NYSTOP) topical powder 1 application  1 application Topical Daily PRN Barrett, Rhonda G, PA-C      . ondansetron (ZOFRAN) injection 4 mg  4 mg Intravenous Q6H PRN Barrett, Rhonda G, PA-C      . pantoprazole (PROTONIX) EC tablet 40 mg  40 mg Oral Daily Barrett, Rhonda G, PA-C   40 mg at 08/16/20 1241  . pentoxifylline (TRENTAL) CR tablet 400 mg  400 mg Oral Daily Barrett, Rhonda G, PA-C   400 mg at 08/16/20 1220  . polyethylene glycol (MIRALAX / GLYCOLAX) packet 17 g  17 g Oral Daily Geralynn Rile, MD   17 g at 08/16/20 1241  . rOPINIRole (REQUIP) tablet 0.5 mg  0.5 mg Oral QHS Barrett, Rhonda G, PA-C   0.5 mg at 08/15/20 2248  . rosuvastatin (CRESTOR) tablet 5 mg  5 mg Oral Daily Barrett, Rhonda G, PA-C   5 mg at 08/16/20 1241  . senna-docusate (Senokot-S) tablet 1 tablet  1 tablet Oral QHS Barrett, Rhonda G, PA-C   1 tablet at 08/15/20 2249  . sodium chloride flush (NS) 0.9 % injection 3 mL  3 mL Intravenous Q12H Barrett, Rhonda G, PA-C   3 mL at 08/15/20 2246  . sodium chloride flush (NS) 0.9 % injection 3 mL  3 mL Intravenous PRN Barrett, Evelene Croon, PA-C      . traMADol (ULTRAM) tablet 50 mg  50 mg Oral QHS Barrett, Rhonda G, PA-C   50 mg at 08/15/20 2249  . zolpidem (AMBIEN) tablet 5 mg  5 mg Oral QHS PRN Barrett, Rhonda G, PA-C        Allergies as of 08/15/2020 - Review Complete 08/15/2020  Allergen Reaction Noted  . Norvasc [amlodipine besylate] Shortness Of Breath 11/22/2007  . Avapro [irbesartan]  09/22/2016  . Glucophage [metformin hydrochloride] Other (See Comments) 09/15/2011  . Lisinopril  09/15/2011  . Losartan  09/15/2011  . Morphine and related Other (See Comments) 09/15/2011  . Simvastatin Other (See Comments) 03/22/2015  . Trulicity [dulaglutide] Other (See Comments) 03/22/2015  . Fiber Rash 04/24/2019    Family History  Problem Relation Age of Onset  . Diabetes Mother   . Diabetes Brother   .  Hypertension Sister   . Colon cancer Neg Hx   . Heart attack  Neg Hx   . Stroke Neg Hx   . Colon polyps Neg Hx   . Esophageal cancer Neg Hx   . Rectal cancer Neg Hx   . Stomach cancer Neg Hx     Social History   Socioeconomic History  . Marital status: Divorced    Spouse name: Not on file  . Number of children: 2  . Years of education: Not on file  . Highest education level: Not on file  Occupational History  . Occupation: retired  Tobacco Use  . Smoking status: Former Smoker    Packs/day: 2.00    Years: 3.00    Pack years: 6.00    Types: Cigarettes    Quit date: 06/23/1983    Years since quitting: 37.1  . Smokeless tobacco: Never Used  Vaping Use  . Vaping Use: Never used  Substance and Sexual Activity  . Alcohol use: Yes    Alcohol/week: 2.0 standard drinks    Types: 2 Glasses of wine per week    Comment: occ  . Drug use: No  . Sexual activity: Not Currently  Other Topics Concern  . Not on file  Social History Narrative  . Not on file   Social Determinants of Health   Financial Resource Strain: Not on file  Food Insecurity: Not on file  Transportation Needs: Not on file  Physical Activity: Not on file  Stress: Not on file  Social Connections: Not on file  Intimate Partner Violence: Not on file    Review of Systems: All systems reviewed and negative except where noted in HPI.    OBJECTIVE:    Physical Exam: Vital signs in last 24 hours: Temp:  [97.5 F (36.4 C)-98.1 F (36.7 C)] 98 F (36.7 C) (02/25 1303) Pulse Rate:  [59-68] 63 (02/25 1303) Resp:  [16-18] 18 (02/25 1303) BP: (116-170)/(38-69) 146/48 (02/25 1303) SpO2:  [96 %-100 %] 100 % (02/25 1303) Weight:  [82.4 kg-83.4 kg] 82.4 kg (02/25 0400) Last BM Date: 08/14/20 General:   Alert  female in NAD Psych:  Pleasant, cooperative. Normal mood and affect. Eyes:  Pupils equal, sclera clear, no icterus.   Conjunctiva pink. Ears:  Normal auditory acuity. Nose:  No deformity, discharge,  or  lesions. Neck:  Supple; no masses Lungs:  Clear throughout to auscultation.   No wheezes, crackles, or rhonchi.  Heart:  Regular rate and rhythm;  1+ BLE edema Abdomen:  Soft, non-distended, nontender, BS active, no palp mass. Two small abdominal wall hernias near umbilicus  Rectal:  Deferred  Msk:  Symmetrical without gross deformities. . Neurologic:  Alert and  oriented x4;  grossly normal neurologically. Skin:  Intact without significant lesions or rashes.  Filed Weights   08/15/20 2037 08/16/20 0400  Weight: 83.4 kg 82.4 kg     Scheduled inpatient medications . acidophilus  1 capsule Oral Daily  . amiodarone  200 mg Oral Daily  . carvedilol  3.125 mg Oral BID WC  . cholecalciferol  1,000 Units Oral Daily  . dorzolamide  1 drop Right Eye BID  . febuxostat  40 mg Oral Q M,W,F  . ferrous sulfate  325 mg Oral BID  . furosemide  80 mg Intravenous BID  . gabapentin  300 mg Oral Daily  . gabapentin  400 mg Oral QHS  . insulin aspart  0-15 Units Subcutaneous TID WC  . insulin aspart  0-5 Units Subcutaneous QHS  . insulin glargine  5 Units Subcutaneous QHS  . levothyroxine  50  mcg Oral QHS  . montelukast  10 mg Oral QHS  . pantoprazole  40 mg Oral Daily  . pentoxifylline  400 mg Oral Daily  . polyethylene glycol  17 g Oral Daily  . rOPINIRole  0.5 mg Oral QHS  . rosuvastatin  5 mg Oral Daily  . senna-docusate  1 tablet Oral QHS  . sodium chloride flush  3 mL Intravenous Q12H  . traMADol  50 mg Oral QHS      Intake/Output from previous day: 02/24 0701 - 02/25 0700 In: 818 [P.O.:480; Blood:338] Out: 1000 [Urine:1000] Intake/Output this shift: Total I/O In: 342 [Blood:342] Out: 900 [Urine:900]   Lab Results: Recent Labs    08/15/20 2129 08/16/20 0619  WBC 8.0 8.5  HGB 6.3* 5.6*  HCT 20.9* 18.3*  PLT 336 297   BMET Recent Labs    08/15/20 2129 08/16/20 0619  NA 137 139  K 5.4* 4.7  CL 102 101  CO2 27 27  GLUCOSE 270* 152*  BUN 83* 81*  CREATININE  2.18* 2.16*  CALCIUM 9.0 8.8*   LFT Recent Labs    08/15/20 2129  PROT 5.5*  ALBUMIN 3.1*  AST 14*  ALT 10  ALKPHOS 68  BILITOT 0.4  BILIDIR 0.1  IBILI 0.3   PT/INR No results for input(s): LABPROT, INR in the last 72 hours. Hepatitis Panel No results for input(s): HEPBSAG, HCVAB, HEPAIGM, HEPBIGM in the last 72 hours.   . CBC Latest Ref Rng & Units 08/16/2020 08/15/2020 07/31/2020  WBC 4.0 - 10.5 K/uL 8.5 8.0 -  Hemoglobin 12.0 - 15.0 g/dL 5.6(LL) 6.3(LL) 10.5(L)  Hematocrit 36.0 - 46.0 % 18.3(L) 20.9(L) 31.0(L)  Platelets 150 - 400 K/uL 297 336 -    . CMP Latest Ref Rng & Units 08/16/2020 08/15/2020 08/05/2020  Glucose 70 - 99 mg/dL 152(H) 270(H) 159(H)  BUN 8 - 23 mg/dL 81(H) 83(H) 106(HH)  Creatinine 0.44 - 1.00 mg/dL 2.16(H) 2.18(H) 2.19(H)  Sodium 135 - 145 mmol/L 139 137 142  Potassium 3.5 - 5.1 mmol/L 4.7 5.4(H) 4.4  Chloride 98 - 111 mmol/L 101 102 102  CO2 22 - 32 mmol/L _0 Calcium 8.9 - 10.3 mg/dL 8.8(L) 9.0 8.8  Total Protein 6.5 - 8.1 g/dL - 5.5(L) -  Total Bilirubin 0.3 - 1.2 mg/dL - 0.4 -  Alkaline Phos 38 - 126 U/L - 68 -  AST 15 - 41 U/L - 14(L) -  ALT 0 - 44 U/L - 10 -   Studies/Results: Portable chest x-ray 1 view  Result Date: 08/15/2020 CLINICAL DATA:  CHF EXAM: PORTABLE CHEST 1 VIEW COMPARISON:  07/23/2020 FINDINGS: Left-sided pacing device as before. Cardiomegaly with vascular congestion. Aortic atherosclerosis. Trace pleural effusions. No overt pulmonary edema. No pneumothorax. IMPRESSION: Cardiomegaly with vascular congestion and trace pleural effusions. Electronically Signed   By: Donavan Foil M.D.   On: 08/15/2020 21:08    Principal Problem:   Acute diastolic heart failure (HCC) Active Problems:   Junctional bradycardia   Pacemaker   Diabetes mellitus type 2, controlled ([place])   CKD (chronic kidney disease) stage 3, GFR 30-59 ml/min (HCC)   Anemia in chronic kidney disease   Type II diabetes mellitus (HCC)   AKI (acute kidney  injury) ([place])   CHF (congestive heart failure) ([place])    Tye Savoy, NP-C @  08/16/2020, 2:55 PM

## 2020-08-17 ENCOUNTER — Encounter (HOSPITAL_COMMUNITY): Payer: Self-pay | Admitting: Cardiovascular Disease

## 2020-08-17 ENCOUNTER — Inpatient Hospital Stay (HOSPITAL_COMMUNITY): Payer: Medicare Other | Admitting: Anesthesiology

## 2020-08-17 ENCOUNTER — Encounter (HOSPITAL_COMMUNITY)
Admission: AD | Disposition: A | Discharge status: Home / Self Care | Payer: Self-pay | Source: Ambulatory Visit | Attending: Internal Medicine

## 2020-08-17 ENCOUNTER — Encounter (HOSPITAL_COMMUNITY): Payer: Self-pay | Admitting: Anesthesiology

## 2020-08-17 DIAGNOSIS — E1165 Type 2 diabetes mellitus with hyperglycemia: Secondary | ICD-10-CM

## 2020-08-17 DIAGNOSIS — N179 Acute kidney failure, unspecified: Secondary | ICD-10-CM

## 2020-08-17 DIAGNOSIS — R001 Bradycardia, unspecified: Secondary | ICD-10-CM

## 2020-08-17 DIAGNOSIS — D649 Anemia, unspecified: Secondary | ICD-10-CM

## 2020-08-17 DIAGNOSIS — E1142 Type 2 diabetes mellitus with diabetic polyneuropathy: Secondary | ICD-10-CM

## 2020-08-17 DIAGNOSIS — I509 Heart failure, unspecified: Secondary | ICD-10-CM

## 2020-08-17 DIAGNOSIS — Z794 Long term (current) use of insulin: Secondary | ICD-10-CM

## 2020-08-17 DIAGNOSIS — Z950 Presence of cardiac pacemaker: Secondary | ICD-10-CM

## 2020-08-17 DIAGNOSIS — K222 Esophageal obstruction: Secondary | ICD-10-CM

## 2020-08-17 DIAGNOSIS — N183 Chronic kidney disease, stage 3 unspecified: Secondary | ICD-10-CM

## 2020-08-17 HISTORY — PX: HOT HEMOSTASIS: SHX5433

## 2020-08-17 HISTORY — PX: ESOPHAGEAL DILATION: SHX303

## 2020-08-17 HISTORY — PX: ESOPHAGOGASTRODUODENOSCOPY (EGD) WITH PROPOFOL: SHX5813

## 2020-08-17 LAB — GLUCOSE, CAPILLARY
Glucose-Capillary: 103 mg/dL — ABNORMAL HIGH (ref 70–99)
Glucose-Capillary: 129 mg/dL — ABNORMAL HIGH (ref 70–99)
Glucose-Capillary: 193 mg/dL — ABNORMAL HIGH (ref 70–99)
Glucose-Capillary: 156 mg/dL — ABNORMAL HIGH (ref 70–99)

## 2020-08-17 LAB — BASIC METABOLIC PANEL
Anion gap: 9 (ref 5–15)
BUN: 76 mg/dL — ABNORMAL HIGH (ref 8–23)
CO2: 28 mmol/L (ref 22–32)
Calcium: 8.6 mg/dL — ABNORMAL LOW (ref 8.9–10.3)
Chloride: 103 mmol/L (ref 98–111)
Creatinine, Ser: 2.26 mg/dL — ABNORMAL HIGH (ref 0.44–1.00)
GFR, Estimated: 22 mL/min — ABNORMAL LOW (ref >60)
Glucose, Bld: 145 mg/dL — ABNORMAL HIGH (ref 70–99)
Potassium: 4.6 mmol/L (ref 3.5–5.1)
Sodium: 140 mmol/L (ref 135–145)

## 2020-08-17 LAB — CBC
HCT: 26.6 % — ABNORMAL LOW (ref 36.0–46.0)
Hemoglobin: 8.6 g/dL — ABNORMAL LOW (ref 12.0–15.0)
MCH: 31.2 pg (ref 26.0–34.0)
MCHC: 32.3 g/dL (ref 30.0–36.0)
MCV: 96.4 fL (ref 80.0–100.0)
Platelets: 239 10*3/uL (ref 150–400)
RBC: 2.76 MIL/uL — ABNORMAL LOW (ref 3.87–5.11)
RDW: 18.2 % — ABNORMAL HIGH (ref 11.5–15.5)
WBC: 6.8 10*3/uL (ref 4.0–10.5)
nRBC: 0 % (ref 0.0–0.2)

## 2020-08-17 LAB — BPAM FFP
Blood Product Expiration Date: 202203022359
ISSUE DATE / TIME: 202202251537
Unit Type and Rh: 6200

## 2020-08-17 LAB — PREPARE FRESH FROZEN PLASMA: Unit division: 0

## 2020-08-17 SURGERY — ESOPHAGOGASTRODUODENOSCOPY (EGD) WITH PROPOFOL
Anesthesia: Monitor Anesthesia Care

## 2020-08-17 SURGERY — IR WITH ANESTHESIA
Anesthesia: General

## 2020-08-17 MED ORDER — SODIUM CHLORIDE 0.9 % IV SOLN: INTRAVENOUS | Status: DC

## 2020-08-17 MED ORDER — PHENYLEPHRINE 40 MCG/ML (10ML) SYRINGE FOR IV PUSH (FOR BLOOD PRESSURE SUPPORT)
PREFILLED_SYRINGE | INTRAVENOUS | Status: DC | PRN
  Administered 2020-08-17 (×2): 80 ug via INTRAVENOUS

## 2020-08-17 MED ORDER — METOLAZONE 5 MG PO TABS
5.0000 mg | ORAL_TABLET | Freq: Once | ORAL | Status: AC
  Administered 2020-08-17: 5 mg via ORAL
  Filled 2020-08-17: qty 1

## 2020-08-17 MED ORDER — SODIUM CHLORIDE 0.9 % IV SOLN: INTRAVENOUS | Status: DC | PRN

## 2020-08-17 MED ORDER — ONDANSETRON HCL 4 MG/2ML IJ SOLN
INTRAMUSCULAR | Status: AC
  Filled 2020-08-17: qty 2

## 2020-08-17 MED ORDER — PROPOFOL 10 MG/ML IV BOLUS
INTRAVENOUS | Status: DC | PRN
  Administered 2020-08-17: 20 mg via INTRAVENOUS

## 2020-08-17 MED ORDER — METOLAZONE 2.5 MG PO TABS: 2.5000 mg | ORAL_TABLET | Freq: Once | ORAL | Status: DC

## 2020-08-17 MED ORDER — PROPOFOL 500 MG/50ML IV EMUL
INTRAVENOUS | Status: DC | PRN
  Administered 2020-08-17: 100 ug/kg/min via INTRAVENOUS

## 2020-08-17 SURGICAL SUPPLY — 14 items

## 2020-08-17 NOTE — Anesthesia Preprocedure Evaluation (Addendum)
Anesthesia Evaluation  Patient identified by MRN, date of birth, ID band Patient awake    Reviewed: Allergy & Precautions, NPO status , Patient's Chart, lab work & pertinent test results  Airway Mallampati: III  TM Distance: >3 FB Neck ROM: Full    Dental no notable dental hx.    Pulmonary shortness of breath, former smoker,    Pulmonary exam normal breath sounds clear to auscultation       Cardiovascular hypertension, Pt. on home beta blockers +CHF  Normal cardiovascular exam+ dysrhythmias Atrial Fibrillation + pacemaker  Rhythm:Regular Rate:Normal  ECHO: 1. Left ventricular ejection fraction, by estimation, is 50 to 55%. The left ventricle has low normal function. The left ventricle has no regional wall motion abnormalities. 2. Right ventricular systolic function is normal. The right ventricular size is normal. There is moderately elevated pulmonary artery systolic pressure. 3. The mitral valve is normal in structure. Moderate to severe mitral valve regurgitation. No evidence of mitral stenosis. Moderate mitral annular calcification. 4. The aortic valve is normal in structure. Aortic valve regurgitation is not visualized. No aortic stenosis is present. 5. The inferior vena cava is dilated in size with <50% respiratory variability, suggesting right atrial pressure of 15 mmHg.   Neuro/Psych PSYCHIATRIC DISORDERS Anxiety Depression  Neuromuscular disease    GI/Hepatic Neg liver ROS, hiatal hernia, GERD  Medicated and Controlled,  Endo/Other  diabetesHypothyroidism   Renal/GU CRFRenal disease     Musculoskeletal  (+) Arthritis , Chronic back pain Gout   Abdominal (+) + obese,   Peds  Hematology  (+) anemia , HLD   Anesthesia Other Findings anemia  Reproductive/Obstetrics                            Anesthesia Physical Anesthesia Plan  ASA: III  Anesthesia Plan: MAC   Post-op Pain  Management:    Induction:   PONV Risk Score and Plan: 2 and Propofol infusion and Treatment may vary due to age or medical condition  Airway Management Planned: Nasal Cannula  Additional Equipment:   Intra-op Plan:   Post-operative Plan:   Informed Consent: I have reviewed the patients History and Physical, chart, labs and discussed the procedure including the risks, benefits and alternatives for the proposed anesthesia with the patient or authorized representative who has indicated his/her understanding and acceptance.     Dental advisory given  Plan Discussed with: CRNA  Anesthesia Plan Comments:        Anesthesia Quick Evaluation

## 2020-08-17 NOTE — Op Note (Signed)
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 Good serologic response to 3U                            pRBCs, with repeat Hgb 8.6 this morning.                           She additional has been having intermittent solid                            food dysphagia. Providers:                Gerrit Heck, MD, Erenest Rasher, RN, Laverda Sorenson, Technician, Clearnce Sorrel, CRNA Referring MD:              Medicines:                Monitored Anesthesia Care Complications:            No immediate complications. Estimated Blood Loss:     Estimated blood loss was minimal. Procedure:                Pre-Anesthesia Assessment:                           - Prior to the procedure, a History and Physical                            was performed, and patient medications and                            allergies were reviewed. The patient's tolerance of                            previous anesthesia was also reviewed. The risks                            and benefits of the procedure and the sedation  options and risks were discussed with the patient.                            All questions were answered, and informed consent                            was obtained. Prior Anticoagulants: The patient has                            taken Eliquis (apixaban), last dose was 2 days                            prior to procedure. ASA Grade Assessment: III - A                            patient with  severe systemic disease. After                            reviewing the risks and benefits, the patient was                            deemed in satisfactory condition to undergo the                            procedure.                           After obtaining informed consent, the endoscope was                            passed under direct vision. Throughout the                            procedure, the patient's blood pressure, pulse, and                            oxygen saturations were monitored continuously. The                            GIF-H190 ([id number] Olympus gastroscope was                            introduced through the mouth, and advanced to the                            second part of duodenum. The upper GI endoscopy was                            accomplished without difficulty. The patient                            tolerated the procedure well. Findings:      One benign-appearing, intrinsic mild stenosis was found in the lower       third  of the esophagus. This stenosis measured 1 cm (in length). The       stenosis was traversed. A TTS dilator was passed through the scope.       Dilation with a 16-17-18 mm balloon and an 18-19-20 mm balloon dilator       was performed to 20 mm. The inflated balloon was then dragged proximally       through the esophagus for treatment of subtle proximal strictures. The       dilation site was examined following endoscope reinsertion and showed       mild mucosal disruption of the lower esophageal stricture, consistent       with successful dilation. Estimated blood loss was minimal.      A 2 cm sliding type hiatal hernia was present.      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       Coagulation for hemostasis using argon plasma was successful. Estimated       blood loss was minimal.      The gastric fundus and gastric body were normal.      The examined duodenum was normal. Impression:               -  Benign-appearing esophageal stenosis. Dilated                            with 20 mm TTS balloon with appropriate mucosal                            rent.                           - 2 cm hiatal hernia.                           - Gastric antral vascular ectasia. Treated with                            argon plasma coagulation (APC).                           - Normal gastric fundus and gastric body.                           - Normal examined duodenum.                           - No specimens collected. Recommendation:           - Return patient to hospital ward for ongoing care.                           - Full liquid diet today due to esophageal                            dilation, then advance to previous diet tomorrow as                            tolerated.                           -  Continue present medications.                           - Resume Eliquis (apixaban) at prior dose tomorrow.                           - Repeat upper endoscopy PRN for retreatment.                           - We will arrange for follow-up in the GI clinic at                            appointment to be scheduled following hospital                            discharge.                           - Repeat CBC in 7-10 days after hospital discharge.                           - Please do not hesitate to contact the GI service                            with additional questions or concerns. Procedure Code(s):        --- Professional ---                           [phone], Esophagogastroduodenoscopy, flexible,                            transoral; with control of bleeding, any method                           43249, Esophagogastroduodenoscopy, flexible,                            transoral; with transendoscopic balloon dilation of                            esophagus (less than 30 mm diameter) Diagnosis Code(s):        --- Professional ---                           K22.2, Esophageal obstruction                            K44.9, Diaphragmatic hernia without obstruction or                            gangrene                           K31.819, Angiodysplasia of stomach and duodenum  without bleeding                           D62, Acute posthemorrhagic anemia                           R13.10, Dysphagia, unspecified CPT copyright 2019 [employer]. All rights reserved. The codes documented in this report are preliminary and upon coder review may  be revised to meet current compliance requirements. Gerrit Heck, MD 08/17/2020 9:03:15 AM Number of Addenda: 0

## 2020-08-17 NOTE — Anesthesia Preprocedure Evaluation (Deleted)
Anesthesia Evaluation    Airway        Dental   Pulmonary           Cardiovascular      Neuro/Psych    GI/Hepatic   Endo/Other    Renal/GU      Musculoskeletal   Abdominal   Peds  Hematology   Anesthesia Other Findings   Reproductive/Obstetrics                            Anesthesia Physical  Anesthesia Plan  ASA:   Anesthesia Plan:    Post-op Pain Management:    Induction:   PONV Risk Score and Plan:   Airway Management Planned:   Additional Equipment:   Intra-op Plan:   Post-operative Plan:   Informed Consent:   Plan Discussed with:   Anesthesia Plan Comments:        Anesthesia Quick Evaluation

## 2020-08-17 NOTE — Progress Notes (Addendum)
PROGRESS NOTE  Tricia Gonzales ZOX:[id number] DOB: July 22, 1940 DOA: 08/15/2020 PCP: Carol Ada, MD   LOS: 2 days   Brief narrative:  Patient is a 80 years old female with past medical history of PAF on Eliquis, bradycardia status post PPM, IIDM, CKD stage IIIb, HLD, chronic anemia, history of GI bleed secondary to gastric AVM status post APC in 2020, presented to hospital with increasing shortness of breath for 2 to 3 weeks with peripheral edema.  Patient went to see cardiology and was noted to be in uncontrolled atrial fibrillation.  She underwent cardioversion but had been complaining of black-colored stool.  She went to see her cardiologist again due to worsening shortness of breath and was admitted hospital for decompensated congestive heart failure.  She was however noted to have hemoglobin of 6.3 compared to her baseline of around 10.  He was given packed RBC and Eliquis was on hold.  GI was consulted including hospitalist team for further evaluation and treatment.    Assessment/Plan:  Principal Problem:   Acute diastolic heart failure (HCC) Active Problems:   Junctional bradycardia   Pacemaker   Diabetes mellitus type 2, controlled (HCC)   CKD (chronic kidney disease) stage 3, GFR 30-59 ml/min (HCC)   Anemia in chronic kidney disease   Type II diabetes mellitus (HCC)   AKI (acute kidney injury) (HCC)   CHF (congestive heart failure) (HCC)   Acute blood loss anemia   Symptomatic anemia  Acute on chronic symptomatic anemia. Iron studies showed low iron saturation.  History of gastric AVM.  Continue PPI.  GI is on board.  Has received 3 units of packed RBC and 1 unit FFP support.  Continue to hold Eliquis.  Patient is a status post EGD today.  Argon plasma coagulation was performed for gastric antral vascular ectasia.  Has been started on full liquids at this time.  As per GI could resume Eliquis by a.m. full liquids today.  Resume soft diet tomorrow.  Acute on chronic diastolic CHF  decompensation On Lasix 80 milligrams twice daily.  Patient still appears to be volume overloaded and dyspneic requiring supplemental oxygen.  Continue diuresis.  Follow cardiology recommendation.  History of moderate to severe pulmonary hypertension Closely monitor.  Will need cardiology follow-up as outpatient.  Diabetes mellitus type 2.   Continue sliding scale insulin Accu-Cheks diabetic diet when able.  Hyperlipidemia -Continue statin  Hypothyroid -TSH borderline high, will continue current dose of Synthroid due to atrial fibrillation.  Will need TSH repeat in 4 to 6 weeks.    Paroxysmal atrial fibrillation  controlled at this time.  Continue to hold Eliquis.  DVT prophylaxis: Sequential compression device.  Code Status: Full code  Family Communication: None today.  Status is: Inpatient  Remains inpatient appropriate because:IV treatments appropriate due to intensity of illness or inability to take PO and Inpatient level of care appropriate due to severity of illness,status post EGD, still volume overloaded on IV diuretics.   Dispo: The patient is from: Home              Anticipated d/c is to: Home  Anticipated date of discharge 2 to 3 days              Patient currently is not medically stable to d/c.  On IV diuretics, supplemental oxygen,   Difficult to place patient No   Consultants:  GI  Cardiology  Procedures:   EGD with argon photocoagulation 2/26>  Anti-infectives:  . None  Anti-infectives (From  admission, onward)   None     Subjective: Today, patient was seen and examined at bedside. Seen after EGD, feels short of breath dyspneic no chest pain nausea vomiting.  Objective: Vitals:   08/17/20 0545 08/17/20 0756  BP: (!) 106/54 (!) 170/43  Pulse: 60 61  Resp: 15 15  Temp: 97.9 F (36.6 C) 98.1 F (36.7 C)  SpO2: 94% 94%    Intake/Output Summary (Last 24 hours) at 08/17/2020 0818 Last data filed at 08/17/2020 0525 Gross per 24 hour   Intake 1812 ml  Output 1750 ml  Net 62 ml   Filed Weights   08/15/20 2037 08/16/20 0400 08/17/20 0424  Weight: 83.4 kg 82.4 kg 83.1 kg   Body mass index is 38.27 kg/m.   Physical Exam:  GENERAL: Patient is alert awake and oriented. Not in obvious distress. Obese, nasal cannula in place HENT: Pallor noted.  Pupils equally reactive to light. Oral mucosa is moist NECK: is supple, no gross swelling noted.  Distended neck veins CHEST: .  Diminished breath sounds bilaterally.  Basal crackles noted. CVS: S1 and S2 heard, no murmur. Regular rate and rhythm.  ABDOMEN: Soft, non-tender, bowel sounds are present. EXTREMITIES: Bilateral lower extremity edema-trace CNS: Cranial nerves are intact. No focal motor deficits. SKIN: warm and dry without rashes.  Data Review: I have personally reviewed the following laboratory data and studies,  CBC: Recent Labs  Lab 08/15/20 2129 08/16/20 0619 08/16/20 1454 08/16/20 2003 08/17/20 0318  WBC 8.0 8.5 8.3  --  6.8  NEUTROABS 6.7  --   --   --   --   HGB 6.3* 5.6* 7.8* 8.0* 8.6*  HCT 20.9* 18.3* 24.8* 25.5* 26.6*  MCV 103.5* 101.1* 99.6  --  96.4  PLT 336 297 302  --  433   Basic Metabolic Panel: Recent Labs  Lab 08/15/20 2129 08/16/20 0619 08/17/20 0318  NA 137 139 140  K 5.4* 4.7 4.6  CL 102 101 103  CO2 _0 GLUCOSE 270* 152* 145*  BUN 83* 81* 76*  CREATININE 2.18* 2.16* 2.26*  CALCIUM 9.0 8.8* 8.6*   Liver Function Tests: Recent Labs  Lab 08/15/20 2129  AST 14*  ALT 10  ALKPHOS 68  BILITOT 0.4  PROT 5.5*  ALBUMIN 3.1*   No results for input(s): LIPASE, AMYLASE in the last 168 hours. No results for input(s): AMMONIA in the last 168 hours. Cardiac Enzymes: No results for input(s): CKTOTAL, CKMB, CKMBINDEX, TROPONINI in the last 168 hours. BNP (last 3 results) Recent Labs    07/19/20 1443 08/15/20 2129  BNP 1,025.7* 1,245.9*    ProBNP (last 3 results) No results for input(s): PROBNP in the last 8760  hours.  CBG: Recent Labs  Lab 08/16/20 0626 08/16/20 1231 08/16/20 1557 08/16/20 2027 08/17/20 0603  GLUCAP 139* 253* 184* 192* 103*   Recent Results (from the past 240 hour(s))  Resp Panel by RT PCR (RSV, Flu A&B, Covid) -     Status: None   Collection Time: 08/15/20 11:40 PM  Result Value Ref Range Status   SARS Coronavirus 2 by RT PCR NEGATIVE NEGATIVE Final   Influenza A by PCR NEGATIVE NEGATIVE Final   Influenza B by PCR NEGATIVE NEGATIVE Final   Respiratory Syncytial Virus by PCR NEGATIVE NEGATIVE Final    Comment: Performed at [facility], [address], [place], [place] [address]     Studies: Portable chest x-ray 1 view  Result Date: 08/15/2020 CLINICAL  DATA:  CHF EXAM: PORTABLE CHEST 1 VIEW COMPARISON:  07/23/2020 FINDINGS: Left-sided pacing device as before. Cardiomegaly with vascular congestion. Aortic atherosclerosis. Trace pleural effusions. No overt pulmonary edema. No pneumothorax. IMPRESSION: Cardiomegaly with vascular congestion and trace pleural effusions. Electronically Signed   By: Donavan Foil M.D.   On: 08/15/2020 21:08   ECHOCARDIOGRAM LIMITED  Result Date: 08/16/2020    ECHOCARDIOGRAM LIMITED REPORT   Patient Name:   Tricia Gonzales Date of Exam: 08/16/2020 Medical Rec #:  [id number]     Height:       58.0 in Accession #:    [id number]    Weight:       181.7 lb Date of Birth:  January 21, 1941     BSA:          1.748 m Patient Age:    64 years      BP:           135/38 mmHg Patient Gender: F             HR:           68 bpm. Exam Location:  Inpatient Procedure: 2D Echo, Cardiac Doppler and Color Doppler Indications:    CHF-Acute Diastolic  History:        Patient has prior history of Echocardiogram examinations.                 Pacemaker, Arrythmias:Atrial Fibrillation,                 Signs/Symptoms:Shortness of Breath; Risk Factors:Diabetes,                 Hypertension and Dyslipidemia. CKD.  Sonographer:    Clayton Lefort RDCS (AE) Referring Phys: [place]  1. Left ventricular ejection fraction, by estimation, is 50 to 55%. The left ventricle has low normal function. The left ventricle has no regional wall motion abnormalities.  2. Right ventricular systolic function is normal. The right ventricular size is normal. There is moderately elevated pulmonary artery systolic pressure.  3. The mitral valve is normal in structure. Moderate to severe mitral valve regurgitation. No evidence of mitral stenosis. Moderate mitral annular calcification.  4. The aortic valve is normal in structure. Aortic valve regurgitation is not visualized. No aortic stenosis is present.  5. The inferior vena cava is dilated in size with <50% respiratory variability, suggesting right atrial pressure of 15 mmHg. FINDINGS  Left Ventricle: Left ventricular ejection fraction, by estimation, is 50 to 55%. The left ventricle has low normal function. The left ventricle has no regional wall motion abnormalities. The left ventricular internal cavity size was normal in size. There is no left ventricular hypertrophy.  LV Wall Scoring: The apex is akinetic. Right Ventricle: The right ventricular size is normal. No increase in right ventricular wall thickness. Right ventricular systolic function is normal. There is moderately elevated pulmonary artery systolic pressure. The tricuspid regurgitant velocity is 3.07 m/s, and with an assumed right atrial pressure of 15 mmHg, the estimated right ventricular systolic pressure is 10.1 mmHg. Left Atrium: Left atrial size was normal in size. Right Atrium: Right atrial size was normal in size. Pericardium: There is no evidence of pericardial effusion. Mitral Valve: The mitral valve is normal in structure. There is moderate thickening of the mitral valve leaflet(s). There is moderate calcification of the mitral valve leaflet(s). Moderate mitral annular calcification. Moderate to severe mitral valve regurgitation. No evidence of mitral valve stenosis.  Tricuspid Valve: The  tricuspid valve is normal in structure. Tricuspid valve regurgitation is not demonstrated. No evidence of tricuspid stenosis. Aortic Valve: The aortic valve is normal in structure. Aortic valve regurgitation is not visualized. No aortic stenosis is present. Pulmonic Valve: The pulmonic valve was normal in structure. Pulmonic valve regurgitation is not visualized. No evidence of pulmonic stenosis. Aorta: The aortic root is normal in size and structure. Venous: The inferior vena cava is dilated in size with less than 50% respiratory variability, suggesting right atrial pressure of 15 mmHg. IAS/Shunts: No atrial level shunt detected by color flow Doppler. Additional Comments: A pacer wire is visualized. LEFT VENTRICLE PLAX 2D LVIDd:         5.30 cm LVIDs:         3.80 cm LV PW:         1.60 cm LV IVS:        1.10 cm LVOT diam:     2.00 cm LVOT Area:     3.14 cm  LV Volumes (MOD) LV vol d, MOD A2C: 120.0 ml LV vol d, MOD A4C: 134.0 ml LV vol s, MOD A2C: 56.8 ml LV vol s, MOD A4C: 66.6 ml LV SV MOD A2C:     63.2 ml LV SV MOD A4C:     134.0 ml LV SV MOD BP:      64.6 ml IVC IVC diam: 2.40 cm LEFT ATRIUM         Index LA diam:    5.20 cm 2.97 cm/m   AORTA Ao Root diam: 3.50 cm Ao Asc diam:  2.80 cm TRICUSPID VALVE TR Peak grad:   37.7 mmHg TR Vmax:        307.00 cm/s  SHUNTS Systemic Diam: 2.00 cm Candee Furbish MD Electronically signed by Candee Furbish MD Signature Date/Time: 08/16/2020/3:09:53 PM    Final       Flora Lipps, MD  [facility] 08/17/2020  If 7PM-7AM, please contact night-coverage

## 2020-08-17 NOTE — Progress Notes (Signed)
Called MD on call concerning blood administration.  Patient still has 2 units of PRBC to give and last HgB is 8.0.  MD stated to give one more unit for a total of 2 and re-check HgB prior to 3rd unit.

## 2020-08-17 NOTE — Progress Notes (Signed)
Cardiology Progress Note  Patient ID: Tricia Gonzales MRN: [id number] DOB: June 03, 1941 Date of Encounter: 08/17/2020  Primary Cardiologist: Evalina Field, MD  Subjective   Chief Complaint: Shortness of breath.  HPI: Underwent EGD today.  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  Status post argon plasma coagulation.  Eliquis can be resumed tomorrow.  Requiring oxygen.  Still volume overloaded.  ROS:  All other ROS reviewed and negative. Pertinent positives noted in the HPI.     Inpatient Medications  Scheduled Meds: . sodium chloride   Intravenous Once  . acidophilus  1 capsule Oral Daily  . amiodarone  200 mg Oral Daily  . carvedilol  3.125 mg Oral BID WC  . cholecalciferol  1,000 Units Oral Daily  . dorzolamide  1 drop Right Eye BID  . febuxostat  40 mg Oral Q M,W,F  . ferrous sulfate  325 mg Oral BID  . furosemide  80 mg Intravenous BID  . gabapentin  300 mg Oral Daily  . gabapentin  400 mg Oral QHS  . insulin aspart  0-15 Units Subcutaneous TID WC  . insulin aspart  0-5 Units Subcutaneous QHS  . insulin glargine  5 Units Subcutaneous QHS  . levothyroxine  50 mcg Oral QHS  . montelukast  10 mg Oral QHS  . pantoprazole (PROTONIX) IV  40 mg Intravenous Q12H  . pentoxifylline  400 mg Oral Daily  . polyethylene glycol  17 g Oral Daily  . rOPINIRole  0.5 mg Oral QHS  . rosuvastatin  5 mg Oral Daily  . senna-docusate  1 tablet Oral QHS  . sodium chloride flush  3 mL Intravenous Q12H  . traMADol  50 mg Oral QHS   Continuous Infusions: . sodium chloride     PRN Meds: sodium chloride, acetaminophen, ALPRAZolam, nitroGLYCERIN, nystatin, ondansetron (ZOFRAN) IV, sodium chloride flush, zolpidem   Vital Signs   Vitals:   08/17/20 1202 08/17/20 1248 08/17/20 1253 08/17/20 1302  BP: (!) 162/73   (!) 146/77  Pulse: 61   70  Resp:      Temp:      TempSrc:      SpO2: 95% 95% 92% (!) 89%  Weight:      Height:        Intake/Output Summary (Last 24 hours) at  08/17/2020 1347 Last data filed at 08/17/2020 1314 Gross per 24 hour  Intake 2770 ml  Output 2300 ml  Net 470 ml   Last 3 Weights 08/17/2020 08/16/2020 08/15/2020  Weight (lbs) 183 lb 1.6 oz 181 lb 11.2 oz 183 lb 12.8 oz  Weight (kg) 83.054 kg 82.419 kg 83.371 kg      Telemetry  Overnight telemetry shows AV paced rhythm, which I personally reviewed.   ECG  The most recent ECG shows AV paced rhythm, which I personally reviewed.   Physical Exam   Vitals:   08/17/20 1202 08/17/20 1248 08/17/20 1253 08/17/20 1302  BP: (!) 162/73   (!) 146/77  Pulse: 61   70  Resp:      Temp:      TempSrc:      SpO2: 95% 95% 92% (!) 89%  Weight:      Height:         Intake/Output Summary (Last 24 hours) at 08/17/2020 1347 Last data filed at 08/17/2020 1314 Gross per 24 hour  Intake 2770 ml  Output 2300 ml  Net 470 ml    Last 3 Weights 08/17/2020 08/16/2020 08/15/2020  Weight (lbs)  183 lb 1.6 oz 181 lb 11.2 oz 183 lb 12.8 oz  Weight (kg) 83.054 kg 82.419 kg 83.371 kg    Body mass index is 38.27 kg/m.   General: Well nourished, well developed, in no acute distress Head: Atraumatic, normal size  Eyes: PEERLA, EOMI  Neck: Supple, JVD 12 to 15 cm of water positive HJR Endocrine: No thryomegaly Cardiac: Normal S1, S2; RRR; no murmurs, rubs, or gallops Lungs: Crackles at the lung bases Abd: Soft, nontender, no hepatomegaly  Ext: Trace edema Musculoskeletal: No deformities, BUE and BLE strength normal and equal Skin: Warm and dry, no rashes   Neuro: Alert and oriented to person, place, time, and situation, CNII-XII grossly intact, no focal deficits  Psych: Normal mood and affect   Labs  High Sensitivity Troponin:  No results for input(s): TROPONINIHS in the last 720 hours.   Cardiac EnzymesNo results for input(s): TROPONINI in the last 168 hours. No results for input(s): TROPIPOC in the last 168 hours.  Chemistry Recent Labs  Lab 08/15/20 2129 08/16/20 0619 08/17/20 0318  NA 137 139 140   K 5.4* 4.7 4.6  CL 102 101 103  CO2 _0 GLUCOSE 270* 152* 145*  BUN 83* 81* 76*  CREATININE 2.18* 2.16* 2.26*  CALCIUM 9.0 8.8* 8.6*  PROT 5.5*  --   --   ALBUMIN 3.1*  --   --   AST 14*  --   --   ALT 10  --   --   ALKPHOS 68  --   --   BILITOT 0.4  --   --   GFRNONAA 22* 23* 22*  ANIONGAP _1 Hematology Recent Labs  Lab 08/16/20 0619 08/16/20 1454 08/16/20 2003 08/17/20 0318  WBC 8.5 8.3  --  6.8  RBC 1.81* 2.49*  --  2.76*  HGB 5.6* 7.8* 8.0* 8.6*  HCT 18.3* 24.8* 25.5* 26.6*  MCV 101.1* 99.6  --  96.4  MCH 30.9 31.3  --  31.2  MCHC 30.6 31.5  --  32.3  RDW 18.3* 18.0*  --  18.2*  PLT 297 302  --  239   BNP Recent Labs  Lab 08/15/20 2129  BNP 1,245.9*    DDimer No results for input(s): DDIMER in the last 168 hours.   Radiology  Portable chest x-ray 1 view  Result Date: 08/15/2020 CLINICAL DATA:  CHF EXAM: PORTABLE CHEST 1 VIEW COMPARISON:  07/23/2020 FINDINGS: Left-sided pacing device as before. Cardiomegaly with vascular congestion. Aortic atherosclerosis. Trace pleural effusions. No overt pulmonary edema. No pneumothorax. IMPRESSION: Cardiomegaly with vascular congestion and trace pleural effusions. Electronically Signed   By: Donavan Foil M.D.   On: 08/15/2020 21:08   ECHOCARDIOGRAM LIMITED  Result Date: 08/16/2020    ECHOCARDIOGRAM LIMITED REPORT   Patient Name:   Tricia Gonzales Date of Exam: 08/16/2020 Medical Rec #:  [id number]     Height:       58.0 in Accession #:    [id number]    Weight:       181.7 lb Date of Birth:  05-01-1941     BSA:          1.748 m Patient Age:    80 years      BP:           135/38 mmHg Patient Gender: F             HR:  68 bpm. Exam Location:  Inpatient Procedure: 2D Echo, Cardiac Doppler and Color Doppler Indications:    CHF-Acute Diastolic  History:        Patient has prior history of Echocardiogram examinations.                 Pacemaker, Arrythmias:Atrial Fibrillation,                 Signs/Symptoms:Shortness  of Breath; Risk Factors:Diabetes,                 Hypertension and Dyslipidemia. CKD.  Sonographer:    Clayton Lefort RDCS (AE) Referring Phys: [place]  1. Left ventricular ejection fraction, by estimation, is 50 to 55%. The left ventricle has low normal function. The left ventricle has no regional wall motion abnormalities.  2. Right ventricular systolic function is normal. The right ventricular size is normal. There is moderately elevated pulmonary artery systolic pressure.  3. The mitral valve is normal in structure. Moderate to severe mitral valve regurgitation. No evidence of mitral stenosis. Moderate mitral annular calcification.  4. The aortic valve is normal in structure. Aortic valve regurgitation is not visualized. No aortic stenosis is present.  5. The inferior vena cava is dilated in size with <50% respiratory variability, suggesting right atrial pressure of 15 mmHg. FINDINGS  Left Ventricle: Left ventricular ejection fraction, by estimation, is 50 to 55%. The left ventricle has low normal function. The left ventricle has no regional wall motion abnormalities. The left ventricular internal cavity size was normal in size. There is no left ventricular hypertrophy.  LV Wall Scoring: The apex is akinetic. Right Ventricle: The right ventricular size is normal. No increase in right ventricular wall thickness. Right ventricular systolic function is normal. There is moderately elevated pulmonary artery systolic pressure. The tricuspid regurgitant velocity is 3.07 m/s, and with an assumed right atrial pressure of 15 mmHg, the estimated right ventricular systolic pressure is 40.1 mmHg. Left Atrium: Left atrial size was normal in size. Right Atrium: Right atrial size was normal in size. Pericardium: There is no evidence of pericardial effusion. Mitral Valve: The mitral valve is normal in structure. There is moderate thickening of the mitral valve leaflet(s). There is moderate calcification of  the mitral valve leaflet(s). Moderate mitral annular calcification. Moderate to severe mitral valve regurgitation. No evidence of mitral valve stenosis. Tricuspid Valve: The tricuspid valve is normal in structure. Tricuspid valve regurgitation is not demonstrated. No evidence of tricuspid stenosis. Aortic Valve: The aortic valve is normal in structure. Aortic valve regurgitation is not visualized. No aortic stenosis is present. Pulmonic Valve: The pulmonic valve was normal in structure. Pulmonic valve regurgitation is not visualized. No evidence of pulmonic stenosis. Aorta: The aortic root is normal in size and structure. Venous: The inferior vena cava is dilated in size with less than 50% respiratory variability, suggesting right atrial pressure of 15 mmHg. IAS/Shunts: No atrial level shunt detected by color flow Doppler. Additional Comments: A pacer wire is visualized. LEFT VENTRICLE PLAX 2D LVIDd:         5.30 cm LVIDs:         3.80 cm LV PW:         1.60 cm LV IVS:        1.10 cm LVOT diam:     2.00 cm LVOT Area:     3.14 cm  LV Volumes (MOD) LV vol d, MOD A2C: 120.0 ml LV vol d, MOD A4C: 134.0 ml LV  vol s, MOD A2C: 56.8 ml LV vol s, MOD A4C: 66.6 ml LV SV MOD A2C:     63.2 ml LV SV MOD A4C:     134.0 ml LV SV MOD BP:      64.6 ml IVC IVC diam: 2.40 cm LEFT ATRIUM         Index LA diam:    5.20 cm 2.97 cm/m   AORTA Ao Root diam: 3.50 cm Ao Asc diam:  2.80 cm TRICUSPID VALVE TR Peak grad:   37.7 mmHg TR Vmax:        307.00 cm/s  SHUNTS Systemic Diam: 2.00 cm Candee Furbish MD Electronically signed by Candee Furbish MD Signature Date/Time: 08/16/2020/3:09:53 PM    Final     Cardiac Studies  TTE 08/16/2020 1. Left ventricular ejection fraction, by estimation, is 50 to 55%. The  left ventricle has low normal function. The left ventricle has no regional  wall motion abnormalities.  2. Right ventricular systolic function is normal. The right ventricular  size is normal. There is moderately elevated pulmonary  artery systolic  pressure.  3. The mitral valve is normal in structure. Moderate to severe mitral  valve regurgitation. No evidence of mitral stenosis. Moderate mitral  annular calcification.  4. The aortic valve is normal in structure. Aortic valve regurgitation is  not visualized. No aortic stenosis is present.  5. The inferior vena cava is dilated in size with <50% respiratory  variability, suggesting right atrial pressure of 15 mmHg.   Patient Profile  Tricia Gonzales is a 80 y.o. female with atrial fibrillation, junctional bradycardia status post pacemaker, diabetes, hypertension, CKD stage III/IV, hyperlipidemia who was admitted on 08/15/2020 with decompensated diastolic heart failure.  Assessment & Plan   1.  Acute on chronic diastolic heart failure -Still volume overloaded.  Good urine output.  Course has been complicated by anemia and she is received several blood transfusions.  This likely will prolong her hospitalization for heart failure.  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  Status post treatment. -Would recommend to continue 80 mg IV Lasix twice daily. -I will give her 5 mg of metolazone to help with diuresis. -IVC plethoric and dilated on echo.  Elevated pulmonary pressures.  She has several more days of IV diuresis. -We will need to closely monitor her kidney function.  Her uremia is resolving.  Creatinine is stable.  2.  Persistent A. fib status post DCCV -Maintaining sinus rhythm on amiodarone. -Restart Eliquis tomorrow per GI recommendations.  Found to have [place] which is likely the source of her anemia.  3.  Acute on chronic kidney disease -Creatinine is stable.  BUN is improving.  I think her kidney function will continue to improve with diuresis.  For questions or updates, please contact [place] Please consult [email or web address] for contact info under   Time Spent with Patient: I have spent a total of 25 minutes with patient reviewing hospital notes, telemetry, EKGs, labs  and examining the patient as well as establishing an assessment and plan that was discussed with the patient.  > 50% of time was spent in direct patient care.    Signed, Addison Naegeli. Audie Box, [place]  08/17/2020 1:47 PM

## 2020-08-17 NOTE — Interval H&P Note (Signed)
History and Physical Interval Note:  08/17/2020 8:10 AM  Tricia Gonzales  has presented today for surgery, with the diagnosis of anemia.  The various methods of treatment have been discussed with the patient and family. After consideration of risks, benefits and other options for treatment, the patient has consented to  Procedure(s): ESOPHAGOGASTRODUODENOSCOPY (EGD) WITH PROPOFOL (N/A) HOT HEMOSTASIS (ARGON PLASMA COAGULATION/BICAP) (N/A) ESOPHAGEAL DILATION as a surgical intervention.  The patient's history has been reviewed, patient examined, no change in status, stable for surgery.  I have reviewed the patient's chart and labs.  Questions were answered to the patient's satisfaction.     Dominic Pea Cirigliano

## 2020-08-17 NOTE — Transfer of Care (Signed)
Immediate Anesthesia Transfer of Care Note  Patient: TAL NEER  Procedure(s) Performed: ESOPHAGOGASTRODUODENOSCOPY (EGD) WITH PROPOFOL (N/A ) HOT HEMOSTASIS (ARGON PLASMA COAGULATION/BICAP) (N/A ) ESOPHAGEAL DILATION  Patient Location: Endoscopy Unit  Anesthesia Type:MAC  Level of Consciousness: awake, alert  and oriented  Airway & Oxygen Therapy: Patient Spontanous Breathing and Patient connected to nasal cannula oxygen  Post-op Assessment: Report given to RN and Post -op Vital signs reviewed and stable  Post vital signs: Reviewed and stable  Last Vitals:  Vitals Value Taken Time  BP    Temp    Pulse 60 08/17/20 0848  Resp 17 08/17/20 0848  SpO2 98 % 08/17/20 0848    Last Pain:  Vitals:   08/17/20 0756  TempSrc: Oral  PainSc: 0-No pain      Patients Stated Pain Goal: 2 (10/62/69 4854)  Complications: No complications documented.

## 2020-08-17 NOTE — Plan of Care (Signed)
Patient receiving PBC, discussed potential side effects with patient and to call if she starts to experience any fever, chills, difficulty breathing.  Patient out of bed and able to bathe self, pt remains NPO after MN for EGD in AM.  Problem: Education: Goal: Knowledge of General Education information will improve Description: Including pain rating scale, medication(s)/side effects and non-pharmacologic comfort measures Outcome: Progressing   Problem: Health Behavior/Discharge Planning: Goal: Ability to manage health-related needs will improve Outcome: Progressing   Problem: Clinical Measurements: Goal: Ability to maintain clinical measurements within normal limits will improve Outcome: Progressing Goal: Will remain free from infection Outcome: Progressing Goal: Diagnostic test results will improve Outcome: Progressing Goal: Respiratory complications will improve Outcome: Progressing Goal: Cardiovascular complication will be avoided Outcome: Progressing   Problem: Coping: Goal: Level of anxiety will decrease Outcome: Progressing   Problem: Elimination: Goal: Will not experience complications related to bowel motility Outcome: Progressing Goal: Will not experience complications related to urinary retention Outcome: Progressing   Problem: Safety: Goal: Ability to remain free from injury will improve Outcome: Progressing   Problem: Cardiac: Goal: Ability to achieve and maintain adequate cardiopulmonary perfusion will improve Outcome: Progressing

## 2020-08-17 NOTE — Anesthesia Postprocedure Evaluation (Signed)
Anesthesia Post Note  Patient: Tricia Gonzales  Procedure(s) Performed: ESOPHAGOGASTRODUODENOSCOPY (EGD) WITH PROPOFOL (N/A ) HOT HEMOSTASIS (ARGON PLASMA COAGULATION/BICAP) (N/A ) ESOPHAGEAL DILATION     Patient location during evaluation: Endoscopy Anesthesia Type: MAC Level of consciousness: awake and alert Pain management: pain level controlled Vital Signs Assessment: post-procedure vital signs reviewed and stable Respiratory status: spontaneous breathing, nonlabored ventilation, respiratory function stable and patient connected to nasal cannula oxygen Cardiovascular status: stable and blood pressure returned to baseline Postop Assessment: no apparent nausea or vomiting Anesthetic complications: no   No complications documented.  Last Vitals:  Vitals:   08/17/20 0900 08/17/20 0910  BP: (!) 158/58 (!) 173/54  Pulse: (!) 59 60  Resp: 19 (!) 21  Temp:    SpO2: 94% 93%    Last Pain:  Vitals:   08/17/20 0848  TempSrc: Axillary  PainSc: 0-No pain   Pain Goal: Patients Stated Pain Goal: 2 (08/17/20 0226)                 Catalina Gravel

## 2020-08-18 LAB — TYPE AND SCREEN
ABO/RH(D): A POS
Antibody Screen: NEGATIVE
Unit division: 0
Unit division: 0
Unit division: 0
Unit division: 0
Unit division: 0

## 2020-08-18 LAB — BPAM RBC
Blood Product Expiration Date: 202203142359
Blood Product Expiration Date: 202203152359
Blood Product Expiration Date: 202203202359
Blood Product Expiration Date: 202203212359
Blood Product Expiration Date: 202203212359
ISSUE DATE / TIME: 202202250100
ISSUE DATE / TIME: 202202250904
ISSUE DATE / TIME: 202202251537
ISSUE DATE / TIME: 202202260017
ISSUE DATE / TIME: 202202260523
Unit Type and Rh: 6200
Unit Type and Rh: 6200
Unit Type and Rh: 6200
Unit Type and Rh: 6200
Unit Type and Rh: 6200

## 2020-08-18 LAB — CBC
HCT: 32.7 % — ABNORMAL LOW (ref 36.0–46.0)
Hemoglobin: 10.7 g/dL — ABNORMAL LOW (ref 12.0–15.0)
MCH: 31 pg (ref 26.0–34.0)
MCHC: 32.7 g/dL (ref 30.0–36.0)
MCV: 94.8 fL (ref 80.0–100.0)
Platelets: 256 10*3/uL (ref 150–400)
RBC: 3.45 MIL/uL — ABNORMAL LOW (ref 3.87–5.11)
RDW: 18.9 % — ABNORMAL HIGH (ref 11.5–15.5)
WBC: 8.2 10*3/uL (ref 4.0–10.5)
nRBC: 0 % (ref 0.0–0.2)

## 2020-08-18 LAB — BASIC METABOLIC PANEL
Anion gap: 9 (ref 5–15)
BUN: 67 mg/dL — ABNORMAL HIGH (ref 8–23)
CO2: 32 mmol/L (ref 22–32)
Calcium: 9.1 mg/dL (ref 8.9–10.3)
Chloride: 99 mmol/L (ref 98–111)
Creatinine, Ser: 2.16 mg/dL — ABNORMAL HIGH (ref 0.44–1.00)
GFR, Estimated: 23 mL/min — ABNORMAL LOW (ref >60)
Glucose, Bld: 129 mg/dL — ABNORMAL HIGH (ref 70–99)
Potassium: 4.4 mmol/L (ref 3.5–5.1)
Sodium: 140 mmol/L (ref 135–145)

## 2020-08-18 LAB — GLUCOSE, CAPILLARY
Glucose-Capillary: 120 mg/dL — ABNORMAL HIGH (ref 70–99)
Glucose-Capillary: 184 mg/dL — ABNORMAL HIGH (ref 70–99)
Glucose-Capillary: 177 mg/dL — ABNORMAL HIGH (ref 70–99)
Glucose-Capillary: 200 mg/dL — ABNORMAL HIGH (ref 70–99)

## 2020-08-18 LAB — OCCULT BLOOD X 1 CARD TO LAB, STOOL: Fecal Occult Bld: POSITIVE — AB

## 2020-08-18 LAB — MAGNESIUM: Magnesium: 2.3 mg/dL (ref 1.7–2.4)

## 2020-08-18 MED ORDER — PANTOPRAZOLE SODIUM 40 MG PO TBEC
40.0000 mg | DELAYED_RELEASE_TABLET | Freq: Two times a day (BID) | ORAL | Status: DC
  Administered 2020-08-18 – 2020-08-19 (×2): 40 mg via ORAL
  Filled 2020-08-18 (×2): qty 1

## 2020-08-18 MED ORDER — METOLAZONE 5 MG PO TABS
5.0000 mg | ORAL_TABLET | Freq: Once | ORAL | Status: AC
  Administered 2020-08-18: 5 mg via ORAL
  Filled 2020-08-18: qty 1

## 2020-08-18 MED ORDER — FUROSEMIDE 10 MG/ML IJ SOLN
100.0000 mg | Freq: Two times a day (BID) | INTRAVENOUS | Status: DC
  Administered 2020-08-18 (×2): 100 mg via INTRAVENOUS
  Filled 2020-08-18 (×3): qty 10

## 2020-08-18 MED ORDER — APIXABAN 5 MG PO TABS
5.0000 mg | ORAL_TABLET | Freq: Two times a day (BID) | ORAL | Status: DC
  Administered 2020-08-18 (×2): 5 mg via ORAL
  Filled 2020-08-18 (×2): qty 1

## 2020-08-18 NOTE — Progress Notes (Signed)
[facility] ROUNDING NOTE   Subjective: EGD completed yesterday with APC to GAVE along with TTS balloon dilation of distal esophageal stricture.  Otherwise no acute events overnight.  She states she feels much better today.  Asking to advance her diet.  Hemoglobin 10.7 this morning.  No overt GI blood loss.   Objective: Vital signs in last 24 hours: Temp:  [97.5 F (36.4 C)-98.4 F (36.9 C)] 97.8 F (36.6 C) (02/27 0400) Pulse Rate:  [59-70] 65 (02/27 0400) Resp:  [18-21] 19 (02/27 0400) BP: (132-173)/(54-89) 141/70 (02/27 0400) SpO2:  [88 %-97 %] 95 % (02/27 0400) Weight:  [82.4 kg] 82.4 kg (02/27 0039) Last BM Date: 08/15/20 General: NAD Lungs:  CTA b/l, no w/r/r Heart:  RRR, no m/r/g Abdomen:  Soft, NT, ND, +BS    Intake/Output from previous day: 02/26 0701 - 02/27 0700 In: 2680 [P.O.:2580; I.V.:100] Out: 4350 [Urine:4350] Intake/Output this shift: No intake/output data recorded.   Lab Results: Recent Labs    08/16/20 1454 08/16/20 2003 08/17/20 0318 08/18/20 0414  WBC 8.3  --  6.8 8.2  HGB 7.8* 8.0* 8.6* 10.7*  PLT 302  --  239 256  MCV 99.6  --  96.4 94.8   BMET Recent Labs    08/16/20 0619 08/17/20 0318 08/18/20 0414  NA 139 140 140  K 4.7 4.6 4.4  CL 101 103 99  CO2 27 28 32  GLUCOSE 152* 145* 129*  BUN 81* 76* 67*  CREATININE 2.16* 2.26* 2.16*  CALCIUM 8.8* 8.6* 9.1   LFT Recent Labs    08/15/20 2129  PROT 5.5*  ALBUMIN 3.1*  AST 14*  ALT 10  ALKPHOS 68  BILITOT 0.4  BILIDIR 0.1  IBILI 0.3   PT/INR No results for input(s): INR in the last 72 hours.    Imaging/Other results: ECHOCARDIOGRAM LIMITED  Result Date: 08/16/2020    ECHOCARDIOGRAM LIMITED REPORT   Patient Name:   GHALIA REICKS Chenier Date of Exam: 08/16/2020 Medical Rec #:  [id number]     Height:       58.0 in Accession #:    [id number]    Weight:       181.7 lb Date of Birth:  Jul 06, 1940     BSA:          1.748 m Patient Age:    5 years      BP:           135/38  mmHg Patient Gender: F             HR:           68 bpm. Exam Location:  Inpatient Procedure: 2D Echo, Cardiac Doppler and Color Doppler Indications:    CHF-Acute Diastolic  History:        Patient has prior history of Echocardiogram examinations.                 Pacemaker, Arrythmias:Atrial Fibrillation,                 Signs/Symptoms:Shortness of Breath; Risk Factors:Diabetes,                 Hypertension and Dyslipidemia. CKD.  Sonographer:    Clayton Lefort RDCS (AE) Referring Phys: [place]  1. Left ventricular ejection fraction, by estimation, is 50 to 55%. The left ventricle has low normal function. The left ventricle has no regional wall motion abnormalities.  2. Right ventricular systolic function is normal. The right ventricular  size is normal. There is moderately elevated pulmonary artery systolic pressure.  3. The mitral valve is normal in structure. Moderate to severe mitral valve regurgitation. No evidence of mitral stenosis. Moderate mitral annular calcification.  4. The aortic valve is normal in structure. Aortic valve regurgitation is not visualized. No aortic stenosis is present.  5. The inferior vena cava is dilated in size with <50% respiratory variability, suggesting right atrial pressure of 15 mmHg. FINDINGS  Left Ventricle: Left ventricular ejection fraction, by estimation, is 50 to 55%. The left ventricle has low normal function. The left ventricle has no regional wall motion abnormalities. The left ventricular internal cavity size was normal in size. There is no left ventricular hypertrophy.  LV Wall Scoring: The apex is akinetic. Right Ventricle: The right ventricular size is normal. No increase in right ventricular wall thickness. Right ventricular systolic function is normal. There is moderately elevated pulmonary artery systolic pressure. The tricuspid regurgitant velocity is 3.07 m/s, and with an assumed right atrial pressure of 15 mmHg, the estimated right  ventricular systolic pressure is 45.3 mmHg. Left Atrium: Left atrial size was normal in size. Right Atrium: Right atrial size was normal in size. Pericardium: There is no evidence of pericardial effusion. Mitral Valve: The mitral valve is normal in structure. There is moderate thickening of the mitral valve leaflet(s). There is moderate calcification of the mitral valve leaflet(s). Moderate mitral annular calcification. Moderate to severe mitral valve regurgitation. No evidence of mitral valve stenosis. Tricuspid Valve: The tricuspid valve is normal in structure. Tricuspid valve regurgitation is not demonstrated. No evidence of tricuspid stenosis. Aortic Valve: The aortic valve is normal in structure. Aortic valve regurgitation is not visualized. No aortic stenosis is present. Pulmonic Valve: The pulmonic valve was normal in structure. Pulmonic valve regurgitation is not visualized. No evidence of pulmonic stenosis. Aorta: The aortic root is normal in size and structure. Venous: The inferior vena cava is dilated in size with less than 50% respiratory variability, suggesting right atrial pressure of 15 mmHg. IAS/Shunts: No atrial level shunt detected by color flow Doppler. Additional Comments: A pacer wire is visualized. LEFT VENTRICLE PLAX 2D LVIDd:         5.30 cm LVIDs:         3.80 cm LV PW:         1.60 cm LV IVS:        1.10 cm LVOT diam:     2.00 cm LVOT Area:     3.14 cm  LV Volumes (MOD) LV vol d, MOD A2C: 120.0 ml LV vol d, MOD A4C: 134.0 ml LV vol s, MOD A2C: 56.8 ml LV vol s, MOD A4C: 66.6 ml LV SV MOD A2C:     63.2 ml LV SV MOD A4C:     134.0 ml LV SV MOD BP:      64.6 ml IVC IVC diam: 2.40 cm LEFT ATRIUM         Index LA diam:    5.20 cm 2.97 cm/m   AORTA Ao Root diam: 3.50 cm Ao Asc diam:  2.80 cm TRICUSPID VALVE TR Peak grad:   37.7 mmHg TR Vmax:        307.00 cm/s  SHUNTS Systemic Diam: 2.00 cm Candee Furbish MD Electronically signed by Candee Furbish MD Signature Date/Time: 08/16/2020/3:09:53 PM    Final        Assessment and Plan:  1) GAVE 2) Acute blood loss anemia -EGD with APC completed on 08/17/2020 -H/H continues  to improve.  Continue overt bleeding -Okay to advance diet -Continue high-dose PPI x8 weeks to facilitate continued mucosal healing -Outpatient follow-up already being arranged  3) Dysphagia -Feels much better after TTS balloon dilation of distal esophageal stricture -Advancing diet today  4) History of atrial fibrillation 5) Systemic anticoagulation 6) Acute on chronic diastolic heart failure -Okay to resume anticoagulation today from a GI standpoint Continue diuresis per Cardiology service  GI service will sign off at this time.  Please do not hesitate to contact on-call with additional questions or concerns    Lavena Bullion, DO  08/18/2020, 8:53 AM [facility] Pager [phone]

## 2020-08-18 NOTE — Progress Notes (Addendum)
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  Patient went to see cardiology and was noted to be in uncontrolled atrial fibrillation.  She underwent cardioversion but had been complaining of black-colored stool.  She went to see her cardiologist again due to worsening shortness of breath and was admitted hospital for decompensated congestive heart failure.  She was however noted to have hemoglobin of 6.3 compared to her baseline of around 10.0.  He was given packed RBC and Eliquis was on hold.  GI was consulted including hospitalist team for further evaluation and treatment.    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  Her hemoglobin remained stable after wards.  She remained volume overloaded and dyspneic so Cardiology continued on IV diuretics.  Assessment/Plan:  Principal Problem:   Acute diastolic heart failure (HCC) Active Problems:   Junctional bradycardia   Pacemaker   Diabetes mellitus type 2, controlled (HCC)   CKD (chronic kidney disease) stage 3, GFR 30-59 ml/min (HCC)   Anemia in chronic kidney disease   Type II diabetes mellitus (HCC)   AKI (acute kidney injury) (HCC)   CHF (congestive heart failure) (HCC)   Acute blood loss anemia   Symptomatic anemia   Benign esophageal stricture  Acute on chronic symptomatic anemia. Has improved at this time.  Received multiple units of packed RBC.  Eliquis on hold so far.  GI was consulted and underwent EGD with argon plasma coagulation on 08/17/2020  for gastric  antral vascular ectasia.  As per GI, we will start the patient on solid diet.  As per GI could resume Eliquis today.  Level of 10.7.  GI recommends PPI high-dose for 8 weeks.  Esophageal stenosis status post dilatation.  Was on full liquids yesterday.  Plans to advance to diabetic  diet.  Acute on chronic diastolic CHF decompensation Improving.  On Lasix IV twice a day.  Dose has been increased 100 mg IV twice a day.  Metolazone 5 mg today.  Improving diuresis.  Follow cardiology recommendation.  Down creatinine levels.  Continue strict intake and output charting, daily weights, fluid restriction.  History of moderate to severe pulmonary hypertension Follow up with cardiology as outpatient.  Diabetes mellitus type 2.   Continue sliding scale insulin, Accu-Cheks diabetic diet.  Closely monitor blood glucose levels.  Hyperlipidemia -Continue statin  Hypothyroid -TSH borderline high, will continue current dose of Synthroid due to atrial fibrillation.  Will need TSH repeat in 4 to 6 weeks.    Persistent atrial fibrillation  of cardioversion.  Controlled normal sinus rhythm at this time at this time.  She will be started on Eliquis today.  DVT prophylaxis: Sequential compression device.  Code Status: Full code  Family Communication:  Tried to reach the patient's daughter on the phone but was unable to reach her today.  Status is: Inpatient  Remains inpatient appropriate because:IV treatments appropriate due to intensity of illness or inability to take PO and Inpatient level of care appropriate due to severity of illness,status post EGD, still volume overloaded on  IV diuretics.   Dispo: The patient is from: Home              Anticipated d/c is to: Home   Anticipated date of discharge: 2 to 3 days              Patient currently is not medically stable to d/c.  On IV diuretics   Difficult to place patient No   Consultants:  GI  Cardiology  Procedures:   EGD with argon  photocoagulation 2/26>  Anti-infectives:  . None  Anti-infectives (From admission, onward)   None     Subjective: Today, patient was seen and examined at bedside.  Patient feels better.  Her breathing has improved.  Diuresing well.  Off oxygen.  Has not had a bowel movement.  Denies any nausea vomiting or abdominal pain.  Objective: Vitals:   08/18/20 0039 08/18/20 0400  BP: (!) 133/55 (!) 141/70  Pulse: 62 65  Resp: 18 19  Temp: 98.2 F (36.8 C) 97.8 F (36.6 C)  SpO2: 93% 95%    Intake/Output Summary (Last 24 hours) at 08/18/2020 0742 Last data filed at 08/18/2020 0700 Gross per 24 hour  Intake 2680 ml  Output 4350 ml  Net -1670 ml   Filed Weights   08/16/20 0400 08/17/20 0424 08/18/20 0039  Weight: 82.4 kg 83.1 kg 82.4 kg   Body mass index is 37.95 kg/m.   Physical Exam:  GENERAL: Patient is alert awake and oriented. Not in obvious distress. Obese, nasal cannula in place HENT: Mild pallor noted.  Pupils equally reactive to light. Oral mucosa is moist NECK: is supple, no gross swelling noted.   CHEST: .  Diminished breath sounds bilaterally.  Occasional basal crackles noted. CVS: S1 and S2 heard, no murmur. Regular rate and rhythm.  ABDOMEN: Soft, non-tender, bowel sounds are present. EXTREMITIES: Bilateral lower extremity edema-trace CNS: Cranial nerves are intact. No focal motor deficits. SKIN: warm and dry without rashes.  Data Review: I have personally reviewed the following laboratory data and studies,  CBC: Recent Labs  Lab 08/15/20 2129 08/16/20 0619 08/16/20 1454 08/16/20 2003 08/17/20 0318 08/18/20 0414  WBC 8.0 8.5 8.3  --  6.8 8.2  NEUTROABS 6.7  --   --   --   --   --   HGB 6.3* 5.6* 7.8* 8.0* 8.6* 10.7*  HCT 20.9* 18.3* 24.8* 25.5* 26.6* 32.7*  MCV 103.5* 101.1* 99.6  --  96.4 94.8  PLT 336 297 302  --  239 563   Basic Metabolic Panel: Recent Labs  Lab 08/15/20 2129 08/16/20 0619 08/17/20 0318 08/18/20 0414  NA 137 139 140 140   K 5.4* 4.7 4.6 4.4  CL 102 101 103 99  CO2 _0 32  GLUCOSE 270* 152* 145* 129*  BUN 83* 81* 76* 67*  CREATININE 2.18* 2.16* 2.26* 2.16*  CALCIUM 9.0 8.8* 8.6* 9.1  MG  --   --   --  2.3   Liver Function Tests: Recent Labs  Lab 08/15/20 2129  AST 14*  ALT 10  ALKPHOS 68  BILITOT 0.4  PROT 5.5*  ALBUMIN 3.1*   No results for input(s): LIPASE, AMYLASE in the last 168 hours. No results for input(s): AMMONIA in the last 168 hours. Cardiac Enzymes: No results for input(s): CKTOTAL, CKMB, CKMBINDEX, TROPONINI in the last 168 hours. BNP (last 3 results) Recent Labs    07/19/20 1443 08/15/20 2129  BNP 1,025.7* 1,245.9*    ProBNP (last 3 results)  No results for input(s): PROBNP in the last 8760 hours.  CBG: Recent Labs  Lab 08/17/20 0603 08/17/20 1042 08/17/20 1538 08/17/20 2113 08/18/20 0609  GLUCAP 103* 129* 193* 156* 120*   Recent Results (from the past 240 hour(s))  Resp Panel by RT PCR (RSV, Flu A&B, Covid) -     Status: None   Collection Time: 08/15/20 11:40 PM  Result Value Ref Range Status   SARS Coronavirus 2 by RT PCR NEGATIVE NEGATIVE Final   Influenza A by PCR NEGATIVE NEGATIVE Final   Influenza B by PCR NEGATIVE NEGATIVE Final   Respiratory Syncytial Virus by PCR NEGATIVE NEGATIVE Final    Comment: Performed at [facility], [address], [place], [place] [address]     Studies: ECHOCARDIOGRAM LIMITED  Result Date: 08/16/2020    ECHOCARDIOGRAM LIMITED REPORT   Patient Name:   DEVONDA PEQUIGNOT Mcenroe Date of Exam: 08/16/2020 Medical Rec #:  [id number]     Height:       58.0 in Accession #:    [id number]    Weight:       181.7 lb Date of Birth:  Oct 17, 1940     BSA:          1.748 m Patient Age:    64 years      BP:           135/38 mmHg Patient Gender: F             HR:           68 bpm. Exam Location:  Inpatient Procedure: 2D Echo, Cardiac Doppler and Color Doppler Indications:    CHF-Acute Diastolic  History:        Patient has prior history of  Echocardiogram examinations.                 Pacemaker, Arrythmias:Atrial Fibrillation,                 Signs/Symptoms:Shortness of Breath; Risk Factors:Diabetes,                 Hypertension and Dyslipidemia. CKD.  Sonographer:    Clayton Lefort RDCS (AE) Referring Phys: [place]  1. Left ventricular ejection fraction, by estimation, is 50 to 55%. The left ventricle has low normal function. The left ventricle has no regional wall motion abnormalities.  2. Right ventricular systolic function is normal. The right ventricular size is normal. There is moderately elevated pulmonary artery systolic pressure.  3. The mitral valve is normal in structure. Moderate to severe mitral valve regurgitation. No evidence of mitral stenosis. Moderate mitral annular calcification.  4. The aortic valve is normal in structure. Aortic valve regurgitation is not visualized. No aortic stenosis is present.  5. The inferior vena cava is dilated in size with <50% respiratory variability, suggesting right atrial pressure of 15 mmHg. FINDINGS  Left Ventricle: Left ventricular ejection fraction, by estimation, is 50 to 55%. The left ventricle has low normal function. The left ventricle has no regional wall motion abnormalities. The left ventricular internal cavity size was normal in size. There is no left ventricular hypertrophy.  LV Wall Scoring: The apex is akinetic. Right Ventricle: The right ventricular size is normal. No increase in right ventricular wall thickness. Right ventricular systolic function is normal. There is moderately elevated pulmonary artery systolic pressure. The tricuspid regurgitant velocity is 3.07 m/s, and with an assumed right atrial pressure of 15 mmHg, the estimated right ventricular systolic pressure  is 52.7 mmHg. Left Atrium: Left atrial size was normal in size. Right Atrium: Right atrial size was normal in size. Pericardium: There is no evidence of pericardial effusion. Mitral Valve: The  mitral valve is normal in structure. There is moderate thickening of the mitral valve leaflet(s). There is moderate calcification of the mitral valve leaflet(s). Moderate mitral annular calcification. Moderate to severe mitral valve regurgitation. No evidence of mitral valve stenosis. Tricuspid Valve: The tricuspid valve is normal in structure. Tricuspid valve regurgitation is not demonstrated. No evidence of tricuspid stenosis. Aortic Valve: The aortic valve is normal in structure. Aortic valve regurgitation is not visualized. No aortic stenosis is present. Pulmonic Valve: The pulmonic valve was normal in structure. Pulmonic valve regurgitation is not visualized. No evidence of pulmonic stenosis. Aorta: The aortic root is normal in size and structure. Venous: The inferior vena cava is dilated in size with less than 50% respiratory variability, suggesting right atrial pressure of 15 mmHg. IAS/Shunts: No atrial level shunt detected by color flow Doppler. Additional Comments: A pacer wire is visualized. LEFT VENTRICLE PLAX 2D LVIDd:         5.30 cm LVIDs:         3.80 cm LV PW:         1.60 cm LV IVS:        1.10 cm LVOT diam:     2.00 cm LVOT Area:     3.14 cm  LV Volumes (MOD) LV vol d, MOD A2C: 120.0 ml LV vol d, MOD A4C: 134.0 ml LV vol s, MOD A2C: 56.8 ml LV vol s, MOD A4C: 66.6 ml LV SV MOD A2C:     63.2 ml LV SV MOD A4C:     134.0 ml LV SV MOD BP:      64.6 ml IVC IVC diam: 2.40 cm LEFT ATRIUM         Index LA diam:    5.20 cm 2.97 cm/m   AORTA Ao Root diam: 3.50 cm Ao Asc diam:  2.80 cm TRICUSPID VALVE TR Peak grad:   37.7 mmHg TR Vmax:        307.00 cm/s  SHUNTS Systemic Diam: 2.00 cm Candee Furbish MD Electronically signed by Candee Furbish MD Signature Date/Time: 08/16/2020/3:09:53 PM    Final       Flora Lipps, MD  [facility] 08/18/2020  If 7PM-7AM, please contact night-coverage

## 2020-08-18 NOTE — Progress Notes (Signed)
Cardiology Progress Note  Patient ID: KAJUANA SHAREEF MRN: [id number] DOB: [birth year] Date of Encounter: 08/18/2020  Primary Cardiologist: Evalina Field, MD  Subjective   Chief Complaint: Shortness of breath  HPI: Improved diuresis overnight.  Net -1.1 L.  Creatinine is coming down.  Started Eliquis this morning.  She reports she is still short of breath and not back to baseline.  ROS:  All other ROS reviewed and negative. Pertinent positives noted in the HPI.     Inpatient Medications  Scheduled Meds: . sodium chloride   Intravenous Once  . acidophilus  1 capsule Oral Daily  . amiodarone  200 mg Oral Daily  . apixaban  5 mg Oral BID  . carvedilol  3.125 mg Oral BID WC  . cholecalciferol  1,000 Units Oral Daily  . dorzolamide  1 drop Right Eye BID  . febuxostat  40 mg Oral Q M,W,F  . ferrous sulfate  325 mg Oral BID  . gabapentin  300 mg Oral Daily  . gabapentin  400 mg Oral QHS  . insulin aspart  0-15 Units Subcutaneous TID WC  . insulin aspart  0-5 Units Subcutaneous QHS  . insulin glargine  5 Units Subcutaneous QHS  . levothyroxine  50 mcg Oral QHS  . metolazone  5 mg Oral Once  . montelukast  10 mg Oral QHS  . pantoprazole (PROTONIX) IV  40 mg Intravenous Q12H  . pentoxifylline  400 mg Oral Daily  . polyethylene glycol  17 g Oral Daily  . rOPINIRole  0.5 mg Oral QHS  . rosuvastatin  5 mg Oral Daily  . senna-docusate  1 tablet Oral QHS  . sodium chloride flush  3 mL Intravenous Q12H  . traMADol  50 mg Oral QHS   Continuous Infusions: . sodium chloride    . furosemide     PRN Meds: sodium chloride, acetaminophen, ALPRAZolam, nitroGLYCERIN, nystatin, ondansetron (ZOFRAN) IV, sodium chloride flush, zolpidem   Vital Signs   Vitals:   08/17/20 1535 08/17/20 2000 08/18/20 0039 08/18/20 0400  BP: (!) 145/74 140/69 (!) 133/55 (!) 141/70  Pulse: 62 66 62 65  Resp:  _0 Temp: 98.4 F (36.9 C) 98 F (36.7 C) 98.2 F (36.8 C) 97.8 F (36.6 C)  TempSrc: Oral  Oral Oral Oral  SpO2: 96% 96% 93% 95%  Weight:   82.4 kg   Height:        Intake/Output Summary (Last 24 hours) at 08/18/2020 0925 Last data filed at 08/18/2020 0700 Gross per 24 hour  Intake 2580 ml  Output 4350 ml  Net -1770 ml   Last 3 Weights 08/18/2020 08/17/2020 08/16/2020  Weight (lbs) 181 lb 9.6 oz 183 lb 1.6 oz 181 lb 11.2 oz  Weight (kg) 82.373 kg 83.054 kg 82.419 kg      Telemetry  Overnight telemetry shows AV paced rhythm, which I personally reviewed.   ECG  The most recent ECG shows AV paced rhythm, which I personally reviewed.   Physical Exam   Vitals:   08/17/20 1535 08/17/20 2000 08/18/20 0039 08/18/20 0400  BP: (!) 145/74 140/69 (!) 133/55 (!) 141/70  Pulse: 62 66 62 65  Resp:  _1 Temp: 98.4 F (36.9 C) 98 F (36.7 C) 98.2 F (36.8 C) 97.8 F (36.6 C)  TempSrc: Oral Oral Oral Oral  SpO2: 96% 96% 93% 95%  Weight:   82.4 kg   Height:         Intake/Output Summary (  Last 24 hours) at 08/18/2020 0925 Last data filed at 08/18/2020 0700 Gross per 24 hour  Intake 2580 ml  Output 4350 ml  Net -1770 ml    Last 3 Weights 08/18/2020 08/17/2020 08/16/2020  Weight (lbs) 181 lb 9.6 oz 183 lb 1.6 oz 181 lb 11.2 oz  Weight (kg) 82.373 kg 83.054 kg 82.419 kg    Body mass index is 37.95 kg/m.  General: Well nourished, well developed, in no acute distress Head: Atraumatic, normal size  Eyes: PEERLA, EOMI  Neck: Supple, JVD 10 to 12 cm of water Endocrine: No thryomegaly Cardiac: Normal S1, S2; RRR; no murmurs, rubs, or gallops Lungs: Crackles at the lung bases Abd: Soft, nontender, no hepatomegaly  Ext: 1+ edema Musculoskeletal: No deformities, BUE and BLE strength normal and equal Skin: Warm and dry, no rashes   Neuro: Alert and oriented to person, place, time, and situation, CNII-XII grossly intact, no focal deficits  Psych: Normal mood and affect   Labs  High Sensitivity Troponin:  No results for input(s): TROPONINIHS in the last 720 hours.   Cardiac  EnzymesNo results for input(s): TROPONINI in the last 168 hours. No results for input(s): TROPIPOC in the last 168 hours.  Chemistry Recent Labs  Lab 08/15/20 2129 08/16/20 0619 08/17/20 0318 08/18/20 0414  NA 137 139 140 140  K 5.4* 4.7 4.6 4.4  CL 102 101 103 99  CO2 _0 32  GLUCOSE 270* 152* 145* 129*  BUN 83* 81* 76* 67*  CREATININE 2.18* 2.16* 2.26* 2.16*  CALCIUM 9.0 8.8* 8.6* 9.1  PROT 5.5*  --   --   --   ALBUMIN 3.1*  --   --   --   AST 14*  --   --   --   ALT 10  --   --   --   ALKPHOS 68  --   --   --   BILITOT 0.4  --   --   --   GFRNONAA 22* 23* 22* 23*  ANIONGAP _1 Hematology Recent Labs  Lab 08/16/20 1454 08/16/20 2003 08/17/20 0318 08/18/20 0414  WBC 8.3  --  6.8 8.2  RBC 2.49*  --  2.76* 3.45*  HGB 7.8* 8.0* 8.6* 10.7*  HCT 24.8* 25.5* 26.6* 32.7*  MCV 99.6  --  96.4 94.8  MCH 31.3  --  31.2 31.0  MCHC 31.5  --  32.3 32.7  RDW 18.0*  --  18.2* 18.9*  PLT 302  --  239 256   BNP Recent Labs  Lab 08/15/20 2129  BNP 1,245.9*    DDimer No results for input(s): DDIMER in the last 168 hours.   Radiology  ECHOCARDIOGRAM LIMITED  Result Date: 08/16/2020    ECHOCARDIOGRAM LIMITED REPORT   Patient Name:   RAHAF CARBONELL Cerreta Date of Exam: 08/16/2020 Medical Rec #:  [id number]     Height:       58.0 in Accession #:    [id number]    Weight:       181.7 lb Date of Birth:  02-Apr-1941     BSA:          1.748 m Patient Age:    80 years      BP:           135/38 mmHg Patient Gender: F             HR:  68 bpm. Exam Location:  Inpatient Procedure: 2D Echo, Cardiac Doppler and Color Doppler Indications:    CHF-Acute Diastolic  History:        Patient has prior history of Echocardiogram examinations.                 Pacemaker, Arrythmias:Atrial Fibrillation,                 Signs/Symptoms:Shortness of Breath; Risk Factors:Diabetes,                 Hypertension and Dyslipidemia. CKD.  Sonographer:    Clayton Lefort RDCS (AE) Referring Phys: [place]  1. Left ventricular ejection fraction, by estimation, is 50 to 55%. The left ventricle has low normal function. The left ventricle has no regional wall motion abnormalities.  2. Right ventricular systolic function is normal. The right ventricular size is normal. There is moderately elevated pulmonary artery systolic pressure.  3. The mitral valve is normal in structure. Moderate to severe mitral valve regurgitation. No evidence of mitral stenosis. Moderate mitral annular calcification.  4. The aortic valve is normal in structure. Aortic valve regurgitation is not visualized. No aortic stenosis is present.  5. The inferior vena cava is dilated in size with <50% respiratory variability, suggesting right atrial pressure of 15 mmHg. FINDINGS  Left Ventricle: Left ventricular ejection fraction, by estimation, is 50 to 55%. The left ventricle has low normal function. The left ventricle has no regional wall motion abnormalities. The left ventricular internal cavity size was normal in size. There is no left ventricular hypertrophy.  LV Wall Scoring: The apex is akinetic. Right Ventricle: The right ventricular size is normal. No increase in right ventricular wall thickness. Right ventricular systolic function is normal. There is moderately elevated pulmonary artery systolic pressure. The tricuspid regurgitant velocity is 3.07 m/s, and with an assumed right atrial pressure of 15 mmHg, the estimated right ventricular systolic pressure is 41.6 mmHg. Left Atrium: Left atrial size was normal in size. Right Atrium: Right atrial size was normal in size. Pericardium: There is no evidence of pericardial effusion. Mitral Valve: The mitral valve is normal in structure. There is moderate thickening of the mitral valve leaflet(s). There is moderate calcification of the mitral valve leaflet(s). Moderate mitral annular calcification. Moderate to severe mitral valve regurgitation. No evidence of mitral valve stenosis.  Tricuspid Valve: The tricuspid valve is normal in structure. Tricuspid valve regurgitation is not demonstrated. No evidence of tricuspid stenosis. Aortic Valve: The aortic valve is normal in structure. Aortic valve regurgitation is not visualized. No aortic stenosis is present. Pulmonic Valve: The pulmonic valve was normal in structure. Pulmonic valve regurgitation is not visualized. No evidence of pulmonic stenosis. Aorta: The aortic root is normal in size and structure. Venous: The inferior vena cava is dilated in size with less than 50% respiratory variability, suggesting right atrial pressure of 15 mmHg. IAS/Shunts: No atrial level shunt detected by color flow Doppler. Additional Comments: A pacer wire is visualized. LEFT VENTRICLE PLAX 2D LVIDd:         5.30 cm LVIDs:         3.80 cm LV PW:         1.60 cm LV IVS:        1.10 cm LVOT diam:     2.00 cm LVOT Area:     3.14 cm  LV Volumes (MOD) LV vol d, MOD A2C: 120.0 ml LV vol d, MOD A4C: 134.0 ml LV  vol s, MOD A2C: 56.8 ml LV vol s, MOD A4C: 66.6 ml LV SV MOD A2C:     63.2 ml LV SV MOD A4C:     134.0 ml LV SV MOD BP:      64.6 ml IVC IVC diam: 2.40 cm LEFT ATRIUM         Index LA diam:    5.20 cm 2.97 cm/m   AORTA Ao Root diam: 3.50 cm Ao Asc diam:  2.80 cm TRICUSPID VALVE TR Peak grad:   37.7 mmHg TR Vmax:        307.00 cm/s  SHUNTS Systemic Diam: 2.00 cm Candee Furbish MD Electronically signed by Candee Furbish MD Signature Date/Time: 08/16/2020/3:09:53 PM    Final     Cardiac Studies  TTE 08/16/2020 1. Left ventricular ejection fraction, by estimation, is 50 to 55%. The  left ventricle has low normal function. The left ventricle has no regional  wall motion abnormalities.  2. Right ventricular systolic function is normal. The right ventricular  size is normal. There is moderately elevated pulmonary artery systolic  pressure.  3. The mitral valve is normal in structure. Moderate to severe mitral  valve regurgitation. No evidence of mitral stenosis.  Moderate mitral  annular calcification.  4. The aortic valve is normal in structure. Aortic valve regurgitation is  not visualized. No aortic stenosis is present.  5. The inferior vena cava is dilated in size with <50% respiratory  variability, suggesting right atrial pressure of 15 mmHg.   Patient Profile  CIMONE FAHEY is a 80 y.o. female with atrial fibrillation, junctional bradycardia status post pacemaker, diabetes, hypertension, CKD stage III/IV, hyperlipidemia who was admitted on 08/15/2020 with decompensated diastolic heart failure.  Assessment & Plan   1.  Acute on chronic diastolic heart failure -Diuresis picking up. -Increase Lasix to 100 mg IV twice daily.  Add metolazone 5 mg as a one-time dose this morning to facilitate better diuresis. -Still volume overloaded. -Creatinine is improving.  Hopefully with more diuresis she will do better.  2.  Persistent atrial fibrillation status post cardioversion -Course complicated by acute blood loss anemia from GI bleed.  Status post treatment of G AVE. -Restart Eliquis today. -Maintaining sinus rhythm.  For questions or updates, please contact [place] Please consult [email or web address] for contact info under   Time Spent with Patient: I have spent a total of 25 minutes with patient reviewing hospital notes, telemetry, EKGs, labs and examining the patient as well as establishing an assessment and plan that was discussed with the patient.  > 50% of time was spent in direct patient care.    Signed, Addison Naegeli. Audie Box, [place]  08/18/2020 9:25 AM

## 2020-08-18 NOTE — Plan of Care (Signed)
  Problem: Skin Integrity: Goal: Risk for impaired skin integrity will decrease Outcome: Completed/Met   Problem: Safety: Goal: Ability to remain free from injury will improve Outcome: Completed/Met   Problem: Pain Managment: Goal: General experience of comfort will improve Outcome: Completed/Met   Problem: Coping: Goal: Level of anxiety will decrease Outcome: Completed/Met

## 2020-08-18 NOTE — Plan of Care (Signed)
  Problem: Activity: Goal: Risk for activity intolerance will decrease Outcome: Completed/Met   Problem: Elimination: Goal: Will not experience complications related to bowel motility Outcome: Completed/Met   Problem: Elimination: Goal: Will not experience complications related to urinary retention Outcome: Completed/Met

## 2020-08-19 ENCOUNTER — Encounter (HOSPITAL_COMMUNITY): Payer: Self-pay | Admitting: Gastroenterology

## 2020-08-19 LAB — BASIC METABOLIC PANEL
Anion gap: 10 (ref 5–15)
BUN: 72 mg/dL — ABNORMAL HIGH (ref 8–23)
CO2: 32 mmol/L (ref 22–32)
Calcium: 8.8 mg/dL — ABNORMAL LOW (ref 8.9–10.3)
Chloride: 95 mmol/L — ABNORMAL LOW (ref 98–111)
Creatinine, Ser: 2.43 mg/dL — ABNORMAL HIGH (ref 0.44–1.00)
GFR, Estimated: 20 mL/min — ABNORMAL LOW (ref >60)
Glucose, Bld: 197 mg/dL — ABNORMAL HIGH (ref 70–99)
Potassium: 3.8 mmol/L (ref 3.5–5.1)
Sodium: 137 mmol/L (ref 135–145)

## 2020-08-19 LAB — GLUCOSE, CAPILLARY
Glucose-Capillary: 137 mg/dL — ABNORMAL HIGH (ref 70–99)
Glucose-Capillary: 192 mg/dL — ABNORMAL HIGH (ref 70–99)
Glucose-Capillary: 176 mg/dL — ABNORMAL HIGH (ref 70–99)
Glucose-Capillary: 171 mg/dL — ABNORMAL HIGH (ref 70–99)

## 2020-08-19 LAB — CBC
HCT: 34.6 % — ABNORMAL LOW (ref 36.0–46.0)
Hemoglobin: 10.8 g/dL — ABNORMAL LOW (ref 12.0–15.0)
MCH: 30.2 pg (ref 26.0–34.0)
MCHC: 31.2 g/dL (ref 30.0–36.0)
MCV: 96.6 fL (ref 80.0–100.0)
Platelets: 233 10*3/uL (ref 150–400)
RBC: 3.58 MIL/uL — ABNORMAL LOW (ref 3.87–5.11)
RDW: 17.6 % — ABNORMAL HIGH (ref 11.5–15.5)
WBC: 8.4 10*3/uL (ref 4.0–10.5)
nRBC: 0 % (ref 0.0–0.2)

## 2020-08-19 LAB — MAGNESIUM: Magnesium: 2.1 mg/dL (ref 1.7–2.4)

## 2020-08-19 MED ORDER — METOLAZONE 5 MG PO TABS
5.0000 mg | ORAL_TABLET | Freq: Once | ORAL | Status: AC
  Administered 2020-08-19: 5 mg via ORAL
  Filled 2020-08-19: qty 1

## 2020-08-19 MED ORDER — FUROSEMIDE 10 MG/ML IJ SOLN
100.0000 mg | Freq: Two times a day (BID) | INTRAVENOUS | Status: AC
  Administered 2020-08-19 (×2): 100 mg via INTRAVENOUS
  Filled 2020-08-19 (×2): qty 10

## 2020-08-19 MED ORDER — PANTOPRAZOLE SODIUM 40 MG PO TBEC
40.0000 mg | DELAYED_RELEASE_TABLET | Freq: Two times a day (BID) | ORAL | Status: DC
  Administered 2020-08-19 – 2020-08-20 (×3): 40 mg via ORAL
  Filled 2020-08-19 (×3): qty 1

## 2020-08-19 MED ORDER — APIXABAN 5 MG PO TABS
5.0000 mg | ORAL_TABLET | Freq: Two times a day (BID) | ORAL | Status: DC
  Administered 2020-08-19 – 2020-08-20 (×3): 5 mg via ORAL
  Filled 2020-08-19 (×3): qty 1

## 2020-08-19 NOTE — Progress Notes (Signed)
Page to Dr. Audie Box: 5 beats of vtach per tele tech. Patient is asymptomatic. Got IV lasix this morning and I see another bag ordered for this evening. AM labs K3.8 mg 1.9, cr. 2.43.   R: No new orders.

## 2020-08-19 NOTE — Progress Notes (Signed)
  Mobility Specialist Criteria Algorithm Info.  Mobility Team: HOB elevated:HOB 30 Activity: Ambulated in hall (In chair before and after ambulation) Range of motion: Active; All extremities Level of assistance: Standby assist, set-up cues, supervision of patient - no hands on Assistive device: Cane Minutes sitting in chair:  Minutes stood: 5 minutes Minutes ambulated: 5 minutes Distance ambulated (ft): 210 ft (160 + 50) Mobility response: Tolerated well Bed Position: Chair  Patient received sitting in recliner chair, agreed to participate in mobility this morning. Patient is independent with ADL's, ambulation and transfers. Ambulated in hallway 210 feet with steady gait using cane. Required one standing rest break secondary to fatigue and pain in back. Tolerated ambulation well without incident or complaint and is now sitting in recliner chair with all needs met.   08/19/2020 12:31 PM

## 2020-08-19 NOTE — Progress Notes (Signed)
Cardiology Progress Note  Patient ID: Tricia Gonzales MRN: [id number] DOB: [birth year] Date of Encounter: 08/19/2020  Primary Cardiologist: Evalina Field, MD  Subjective   Chief Complaint: Shortness of breath  HPI: Good diuresis overnight.  Still volume up.  Some pain in her chest overnight and her back.  Sharp pain.  Atypical.  ROS:  All other ROS reviewed and negative. Pertinent positives noted in the HPI.     Inpatient Medications  Scheduled Meds: . sodium chloride   Intravenous Once  . acidophilus  1 capsule Oral Daily  . amiodarone  200 mg Oral Daily  . apixaban  5 mg Oral BID  . carvedilol  3.125 mg Oral BID WC  . cholecalciferol  1,000 Units Oral Daily  . dorzolamide  1 drop Right Eye BID  . febuxostat  40 mg Oral Q M,W,F  . ferrous sulfate  325 mg Oral BID  . gabapentin  300 mg Oral Daily  . gabapentin  400 mg Oral QHS  . insulin aspart  0-15 Units Subcutaneous TID WC  . insulin aspart  0-5 Units Subcutaneous QHS  . insulin glargine  5 Units Subcutaneous QHS  . levothyroxine  50 mcg Oral QHS  . metolazone  5 mg Oral Once  . montelukast  10 mg Oral QHS  . pantoprazole  40 mg Oral BID  . pentoxifylline  400 mg Oral Daily  . polyethylene glycol  17 g Oral Daily  . rOPINIRole  0.5 mg Oral QHS  . rosuvastatin  5 mg Oral Daily  . senna-docusate  1 tablet Oral QHS  . sodium chloride flush  3 mL Intravenous Q12H  . traMADol  50 mg Oral QHS   Continuous Infusions: . sodium chloride    . furosemide     PRN Meds: sodium chloride, acetaminophen, ALPRAZolam, nitroGLYCERIN, nystatin, ondansetron (ZOFRAN) IV, sodium chloride flush, zolpidem   Vital Signs   Vitals:   08/18/20 1646 08/18/20 2013 08/19/20 0251 08/19/20 0522  BP: (!) 146/47 122/62  (!) 139/50  Pulse: 63 62  63  Resp: _0 Temp: 98 F (36.7 C) 97.8 F (36.6 C)  98.5 F (36.9 C)  TempSrc: Oral Oral  Oral  SpO2: 95% 96%  96%  Weight:   81.2 kg   Height:        Intake/Output Summary (Last 24  hours) at 08/19/2020 3212 Last data filed at 08/19/2020 0813 Gross per 24 hour  Intake 1398 ml  Output 3276 ml  Net -1878 ml   Last 3 Weights 08/19/2020 08/18/2020 08/17/2020  Weight (lbs) 179 lb 181 lb 9.6 oz 183 lb 1.6 oz  Weight (kg) 81.194 kg 82.373 kg 83.054 kg      Telemetry  Overnight telemetry shows AV paced rhythm, which I personally reviewed.   ECG  The most recent ECG shows AV paced rhythm, which I personally reviewed.   Physical Exam   Vitals:   08/18/20 1646 08/18/20 2013 08/19/20 0251 08/19/20 0522  BP: (!) 146/47 122/62  (!) 139/50  Pulse: 63 62  63  Resp: _1 Temp: 98 F (36.7 C) 97.8 F (36.6 C)  98.5 F (36.9 C)  TempSrc: Oral Oral  Oral  SpO2: 95% 96%  96%  Weight:   81.2 kg   Height:         Intake/Output Summary (Last 24 hours) at 08/19/2020 2482 Last data filed at 08/19/2020 0813 Gross per 24 hour  Intake 1398 ml  Output 3276 ml  Net -1878 ml    Last 3 Weights 08/19/2020 08/18/2020 08/17/2020  Weight (lbs) 179 lb 181 lb 9.6 oz 183 lb 1.6 oz  Weight (kg) 81.194 kg 82.373 kg 83.054 kg    Body mass index is 37.41 kg/m.  General: Well nourished, well developed, in no acute distress Head: Atraumatic, normal size  Eyes: PEERLA, EOMI  Neck: Supple, JVD 10-12 cmH2O Endocrine: No thryomegaly Cardiac: Normal S1, S2; RRR; no murmurs, rubs, or gallops Lungs: Crackles at the lung bases Abd: Soft, nontender, no hepatomegaly  Ext: No edema, pulses 2+ Musculoskeletal: No deformities, BUE and BLE strength normal and equal Skin: Warm and dry, no rashes   Neuro: Alert and oriented to person, place, time, and situation, CNII-XII grossly intact, no focal deficits  Psych: Normal mood and affect   Labs  High Sensitivity Troponin:  No results for input(s): TROPONINIHS in the last 720 hours.   Cardiac EnzymesNo results for input(s): TROPONINI in the last 168 hours. No results for input(s): TROPIPOC in the last 168 hours.  Chemistry Recent Labs  Lab  08/15/20 2129 08/16/20 0619 08/17/20 0318 08/18/20 0414 08/19/20 0245  NA 137   < > 140 140 137  K 5.4*   < > 4.6 4.4 3.8  CL 102   < > 103 99 95*  CO2 27   < > 28 32 32  GLUCOSE 270*   < > 145* 129* 197*  BUN 83*   < > 76* 67* 72*  CREATININE 2.18*   < > 2.26* 2.16* 2.43*  CALCIUM 9.0   < > 8.6* 9.1 8.8*  PROT 5.5*  --   --   --   --   ALBUMIN 3.1*  --   --   --   --   AST 14*  --   --   --   --   ALT 10  --   --   --   --   ALKPHOS 68  --   --   --   --   BILITOT 0.4  --   --   --   --   GFRNONAA 22*   < > 22* 23* 20*  ANIONGAP 8   < > _0 < > = values in this interval not displayed.    Hematology Recent Labs  Lab 08/17/20 0318 08/18/20 0414 08/19/20 0245  WBC 6.8 8.2 8.4  RBC 2.76* 3.45* 3.58*  HGB 8.6* 10.7* 10.8*  HCT 26.6* 32.7* 34.6*  MCV 96.4 94.8 96.6  MCH 31.2 31.0 30.2  MCHC 32.3 32.7 31.2  RDW 18.2* 18.9* 17.6*  PLT 239 256 233   BNP Recent Labs  Lab 08/15/20 2129  BNP 1,245.9*    DDimer No results for input(s): DDIMER in the last 168 hours.   Radiology  No results found.  Cardiac Studies  TTE 08/16/2020 1. Left ventricular ejection fraction, by estimation, is 50 to 55%. The  left ventricle has low normal function. The left ventricle has no regional  wall motion abnormalities.  2. Right ventricular systolic function is normal. The right ventricular  size is normal. There is moderately elevated pulmonary artery systolic  pressure.  3. The mitral valve is normal in structure. Moderate to severe mitral  valve regurgitation. No evidence of mitral stenosis. Moderate mitral  annular calcification.  4. The aortic valve is normal in structure. Aortic valve regurgitation is  not visualized. No aortic stenosis is present.  5.  The inferior vena cava is dilated in size with <50% respiratory  variability, suggesting right atrial pressure of 15 mmHg.   Patient Profile  Tricia Gonzales a 80 y.o.femalewith atrial fibrillation, junctional  bradycardia status post pacemaker, diabetes, hypertension, CKD stage III/IV, hyperlipidemia who was admitted on 08/15/2020 with decompensated diastolic heart failure.  Found to have GI bleed on admission as well.  Assessment & Plan   1.  Acute on chronic diastolic heart failure -I will give an additional day of diuresis today.  We will give 5 mg of metolazone and Lasix 100 mg IV twice daily today.  Transition to oral diuretic tomorrow. -Creatinine slightly up.  Not far from baseline.  I think her baseline is between 2.1-2.2.  2.  Chest pain -Atypical sharp chest pain and back pain.  Noncardiac.  3.  Persistent atrial fibrillation status post cardioversion -Admitted with GI bleed. -Status post treatment for GAVE. -Back on Eliquis.  Hemoglobin stable.  For questions or updates, please contact Altha Please consult [email or web address] for contact info under   Time Spent with Patient: I have spent a total of 25 minutes with patient reviewing hospital notes, telemetry, EKGs, labs and examining the patient as well as establishing an assessment and plan that was discussed with the patient.  > 50% of time was spent in direct patient care.    Signed, Addison Naegeli. Audie Box, [place]  08/19/2020 9:22 AM

## 2020-08-19 NOTE — Progress Notes (Signed)
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  Patient went to see cardiology and was noted to be in uncontrolled atrial fibrillation.  She underwent cardioversion but had been complaining of black-colored stool.  She went to see her cardiologist again due to worsening shortness of breath and was admitted hospital for decompensated congestive heart failure.  She was however noted to have hemoglobin of 6.3 compared to her baseline of around 10.0.  He was given packed RBC and Eliquis was on hold.  GI was consulted including hospitalist team for further evaluation and treatment.    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  Her hemoglobin remained stable after wards.  She remained volume overloaded and dyspneic so Cardiology was consulted and patient was initiated on IV diuretics.  Assessment/Plan:  Principal Problem:   Acute diastolic heart failure (HCC) Active Problems:   Junctional bradycardia   Pacemaker   Diabetes mellitus type 2, controlled (HCC)   CKD (chronic kidney disease) stage 3, GFR 30-59 ml/min (HCC)   Anemia in chronic kidney disease   Type II diabetes mellitus (HCC)   AKI (acute kidney injury) (HCC)   CHF (congestive heart failure) (HCC)   Acute blood loss anemia   Symptomatic anemia   Benign esophageal stricture  Acute on chronic symptomatic anemia. Has improved at this time.  Latest hemoglobin of 10.8.  During hospitalization patient received multiple units of packed RBC.  Eliquis on hold.  GI was  consulted and patient underwent EGD with argon plasma coagulation on 08/17/2020  for gastric antral vascular ectasia.  As per GI Eliquis has been restarted.  GI recommends PPI high-dose for 8 weeks.  Esophageal stenosis status post dilatation.   Advanced to diabetic  diet.  Acute on chronic diastolic CHF decompensation Improving but still volume overloaded.  Received Lasix IV twice a day as per cardiology with metolazone. Continue strict intake and output charting, daily weights, fluid restriction.  History of moderate to severe pulmonary hypertension Follow up with cardiology as outpatient.  Chronic kidney disease stage IIIb.  Baseline creatinine around 2.1-2.2.  Slightly elevated.  Diabetes mellitus type 2.   Continue sliding scale insulin, Accu-Cheks diabetic diet.  Closely monitor blood glucose levels.  At this time POC glucose of 137  Hyperlipidemia -Continue statin  Hypothyroid -TSH borderline high, will continue current dose of Synthroid due to atrial fibrillation.  Plan to repeat TSH in 4 to 6 weeks.  Persistent atrial fibrillation   Controlled continue Eliquis.  DVT prophylaxis: Eliquis  Code Status: Full code  Family Communication:  None today.  Status is: Inpatient  Remains inpatient appropriate because:IV treatments appropriate due to intensity of illness or inability to take PO and Inpatient level of care appropriate due to severity of illness,status post EGD, still volume overloaded on IV diuretics.   Dispo: The patient is from: Home              Anticipated d/c is to: Home   Anticipated date of discharge: 2 days, when okay with cardiology  Patient currently is not medically stable to d/c.     Difficult to place patient No   Consultants:  GI  Cardiology  Procedures:   EGD with argon photocoagulation 2/26>  Anti-infectives:  . None  Anti-infectives (From admission, onward)   None     Subjective: Today, patient was seen and  examined at bedside.  Complains of mild sharp chest pain.  Denies dyspnea, dizziness, fever cough or chills.  Diuresing well   Objective: Vitals:   08/18/20 2013 08/19/20 0522  BP: 122/62 (!) 139/50  Pulse: 62 63  Resp: 18 18  Temp: 97.8 F (36.6 C) 98.5 F (36.9 C)  SpO2: 96% 96%    Intake/Output Summary (Last 24 hours) at 08/19/2020 0723 Last data filed at 08/19/2020 0700 Gross per 24 hour  Intake 1040 ml  Output 3276 ml  Net -2236 ml   Filed Weights   08/17/20 0424 08/18/20 0039 08/19/20 0251  Weight: 83.1 kg 82.4 kg 81.2 kg   Body mass index is 37.41 kg/m.   Physical Exam:  General: Obese built, not in obvious distress, on nasal cannula oxygen, HENT:   No scleral pallor or icterus noted. Oral mucosa is moist.  Chest:  .  Diminished breath sounds bilaterally.  Few basal crackles. CVS: S1 &S2 heard. No murmur.  Regular rate and rhythm. Abdomen: Soft, nontender, nondistended.  Bowel sounds are heard.   Extremities: No cyanosis, clubbing , trace edema, Peripheral pulses are palpable. Psych: Alert, awake and oriented, normal mood CNS:  No cranial nerve deficits.  Power equal in all extremities.   Skin: Warm and dry.  No rashes noted.   Data Review: I have personally reviewed the following laboratory data and studies,  CBC: Recent Labs  Lab 08/15/20 2129 08/16/20 0619 08/16/20 1454 08/16/20 2003 08/17/20 0318 08/18/20 0414 08/19/20 0245  WBC 8.0 8.5 8.3  --  6.8 8.2 8.4  NEUTROABS 6.7  --   --   --   --   --   --   HGB 6.3* 5.6* 7.8* 8.0* 8.6* 10.7* 10.8*  HCT 20.9* 18.3* 24.8* 25.5* 26.6* 32.7* 34.6*  MCV 103.5* 101.1* 99.6  --  96.4 94.8 96.6  PLT 336 297 302  --  239 256 427   Basic Metabolic Panel: Recent Labs  Lab 08/15/20 2129 08/16/20 0619 08/17/20 0318 08/18/20 0414 08/19/20 0245  NA 137 139 140 140 137  K 5.4* 4.7 4.6 4.4 3.8  CL 102 101 103 99 95*  CO2 _0 32 32  GLUCOSE 270* 152* 145* 129* 197*  BUN 83* 81* 76* 67* 72*  CREATININE  2.18* 2.16* 2.26* 2.16* 2.43*  CALCIUM 9.0 8.8* 8.6* 9.1 8.8*  MG  --   --   --  2.3 2.1   Liver Function Tests: Recent Labs  Lab 08/15/20 2129  AST 14*  ALT 10  ALKPHOS 68  BILITOT 0.4  PROT 5.5*  ALBUMIN 3.1*   No results for input(s): LIPASE, AMYLASE in the last 168 hours. No results for input(s): AMMONIA in the last 168 hours. Cardiac Enzymes: No results for input(s): CKTOTAL, CKMB, CKMBINDEX, TROPONINI in the last 168 hours. BNP (last 3 results) Recent Labs    07/19/20 1443 08/15/20 2129  BNP 1,025.7* 1,245.9*    ProBNP (last 3 results) No results for input(s): PROBNP in the last 8760 hours.  CBG: Recent Labs  Lab 08/18/20 0609 08/18/20 1102 08/18/20 1608 08/18/20 2023 08/19/20 0604  GLUCAP 120* 184* 177* 200* 137*  Recent Results (from the past 240 hour(s))  Resp Panel by RT PCR (RSV, Flu A&B, Covid) -     Status: None   Collection Time: 08/15/20 11:40 PM  Result Value Ref Range Status   SARS Coronavirus 2 by RT PCR NEGATIVE NEGATIVE Final   Influenza A by PCR NEGATIVE NEGATIVE Final   Influenza B by PCR NEGATIVE NEGATIVE Final   Respiratory Syncytial Virus by PCR NEGATIVE NEGATIVE Final    Comment: Performed at [facility], [address], [place],  [address]     Studies: No results found.    Flora Lipps, MD  [facility] 08/19/2020  If 7PM-7AM, please contact night-coverage

## 2020-08-20 ENCOUNTER — Telehealth: Payer: Self-pay

## 2020-08-20 DIAGNOSIS — D649 Anemia, unspecified: Secondary | ICD-10-CM

## 2020-08-20 LAB — COMPREHENSIVE METABOLIC PANEL
ALT: 11 U/L (ref 0–44)
AST: 14 U/L — ABNORMAL LOW (ref 15–41)
Albumin: 2.9 g/dL — ABNORMAL LOW (ref 3.5–5.0)
Alkaline Phosphatase: 76 U/L (ref 38–126)
Anion gap: 13 (ref 5–15)
BUN: 81 mg/dL — ABNORMAL HIGH (ref 8–23)
CO2: 31 mmol/L (ref 22–32)
Calcium: 8.9 mg/dL (ref 8.9–10.3)
Chloride: 91 mmol/L — ABNORMAL LOW (ref 98–111)
Creatinine, Ser: 2.74 mg/dL — ABNORMAL HIGH (ref 0.44–1.00)
GFR, Estimated: 17 mL/min — ABNORMAL LOW (ref >60)
Glucose, Bld: 174 mg/dL — ABNORMAL HIGH (ref 70–99)
Potassium: 3.6 mmol/L (ref 3.5–5.1)
Sodium: 135 mmol/L (ref 135–145)
Total Bilirubin: 1.3 mg/dL — ABNORMAL HIGH (ref 0.3–1.2)
Total Protein: 5.4 g/dL — ABNORMAL LOW (ref 6.5–8.1)

## 2020-08-20 LAB — CBC
HCT: 34.8 % — ABNORMAL LOW (ref 36.0–46.0)
Hemoglobin: 11.1 g/dL — ABNORMAL LOW (ref 12.0–15.0)
MCH: 30.6 pg (ref 26.0–34.0)
MCHC: 31.9 g/dL (ref 30.0–36.0)
MCV: 95.9 fL (ref 80.0–100.0)
Platelets: 218 10*3/uL (ref 150–400)
RBC: 3.63 MIL/uL — ABNORMAL LOW (ref 3.87–5.11)
RDW: 17 % — ABNORMAL HIGH (ref 11.5–15.5)
WBC: 8.6 10*3/uL (ref 4.0–10.5)
nRBC: 0 % (ref 0.0–0.2)

## 2020-08-20 LAB — GLUCOSE, CAPILLARY
Glucose-Capillary: 144 mg/dL — ABNORMAL HIGH (ref 70–99)
Glucose-Capillary: 201 mg/dL — ABNORMAL HIGH (ref 70–99)

## 2020-08-20 LAB — MAGNESIUM: Magnesium: 2 mg/dL (ref 1.7–2.4)

## 2020-08-20 LAB — PHOSPHORUS: Phosphorus: 4.8 mg/dL — ABNORMAL HIGH (ref 2.5–4.6)

## 2020-08-20 MED ORDER — TORSEMIDE 40 MG PO TABS: 40.0000 mg | ORAL_TABLET | Freq: Every day | ORAL | 2 refills | Status: DC

## 2020-08-20 MED ORDER — TORSEMIDE 20 MG PO TABS
40.0000 mg | ORAL_TABLET | Freq: Every day | ORAL | Status: DC
  Administered 2020-08-20: 40 mg via ORAL
  Filled 2020-08-20: qty 2

## 2020-08-20 MED ORDER — PANTOPRAZOLE SODIUM 40 MG PO TBEC: DELAYED_RELEASE_TABLET | ORAL | 0 refills | Status: DC

## 2020-08-20 MED ORDER — FUROSEMIDE 40 MG PO TABS: 40.0000 mg | ORAL_TABLET | Freq: Two times a day (BID) | ORAL | Status: DC

## 2020-08-20 NOTE — Progress Notes (Signed)
D/C instructions given and reviewed along with heart failure booklet. Questions asked and answered. Tele and IV removed, tolerated well. Awaiting ride to transport home.

## 2020-08-20 NOTE — TOC Initial Note (Signed)
Transition of Care [facility]) - Initial/Assessment Note    Patient Details  Name: Tricia Gonzales MRN: [id number] Date of Birth: August 01, 1940  [facility] CM/SW Contact:    Zenon Mayo, RN Phone Number: 08/20/2020, 1:23 PM  Clinical Narrative:                 Patient lives alone, she states her daughter and her sister takes her to [facility]. She states she will resume her outpatient pt with [facility]. She has a walker, a cane, w/chair, scale and bp cuff at home.  She states she tries to eat a low NA diet.   Expected Discharge Plan: Home/Self Care Barriers to Discharge: No Barriers Identified   Patient Goals and CMS Choice Patient states their goals for this hospitalization and ongoing recovery are:: to get back to where she can do things and stay healthy,   Choice offered to / list presented to : NA  Expected Discharge Plan and Services Expected Discharge Plan: Home/Self Care In-house Referral: NA Discharge Planning Services: CM Consult Post Acute Care Choice: NA   Expected Discharge Date: 08/20/20                 DME Agency: NA       HH Arranged: NA          Prior Living Arrangements/Services   Lives with:: Self Patient language and need for interpreter reviewed:: Yes Do you feel safe going back to the place where you live?: Yes      Need for Family Participation in Patient Care: Yes (Comment) Care giver support system in place?: Yes (comment) Current home services: DME (she has a walker, cane, w/chair, scale and bp cuff) Criminal Activity/Legal Involvement Pertinent to Current Situation/Hospitalization: No - Comment as needed  Activities of Daily Living Home Assistive Devices/Equipment: Cane (specify quad or straight) ADL Screening (condition at time of admission) Patient's cognitive ability adequate to safely complete daily activities?: Yes Is the patient deaf or have difficulty hearing?: No Does the patient have difficulty seeing,  even when wearing glasses/contacts?: No Does the patient have difficulty concentrating, remembering, or making decisions?: No Patient able to express need for assistance with ADLs?: Yes Does the patient have difficulty dressing or bathing?: No Independently performs ADLs?: Yes (appropriate for developmental age) Does the patient have difficulty walking or climbing stairs?: No Weakness of Legs: None Weakness of Arms/Hands: None  Permission Sought/Granted                  Emotional Assessment Appearance:: Appears stated age Attitude/Demeanor/Rapport: Engaged Affect (typically observed): Appropriate Orientation: : Oriented to Self,Oriented to Place,Oriented to  Time,Oriented to Situation Alcohol / Substance Use: Not Applicable Psych Involvement: No (comment)  Admission diagnosis:  CHF (congestive heart failure) ([place]) [I50.9] Patient Active Problem List   Diagnosis Date Noted   Benign esophageal stricture    Acute blood loss anemia    Symptomatic anemia    Acute diastolic heart failure ([place]) 08/15/2020   AKI (acute kidney injury) ([place]) 08/15/2020   CHF (congestive heart failure) ([place]) 08/15/2020   Atrial flutter ([place])    Sinus node dysfunction ([place]) 09/06/2019   Status post left hip replacement 06/28/2019   Urinary retention 05/12/2019   Polyneuropathy associated with underlying disease ([place]) 05/12/2019   Immobility 04/26/2019   Low ferritin 04/24/2019   Primary osteoarthritis of left hip 03/29/2019   GAVE (gastric antral vascular ectasia)    Avascular necrosis of bone of hip, left (  [place]) 08/22/2018   Vitamin B12 deficiency    Type II diabetes mellitus (HCC)    Sjogren's disease ([place])    Presence of permanent cardiac pacemaker    Pericarditis    Osteoarthritis    Obesity    Kidney stones    Intestinal obstruction (HCC)    History of gout    Hiatal hernia    History of blood transfusion    Heart murmur    GERD (gastroesophageal reflux  disease)    Gastritis    Esophageal stricture    Early cataracts, bilateral    Diverticulosis    Depression    Chronic kidney disease (CKD), stage III (moderate) (HCC)    Chronic back pain    Atrial fibrillation (HCC)    Arthritis    Anxiety    Anemia    Osteoarthritis of finger of right hand 01/22/2016   Pain 01/22/2016   Primary osteoarthritis of both first carpometacarpal joints 01/22/2016   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/2015   Chest pain at rest    Heme positive stool    UTI (urinary tract infection) 08/09/2015   Acute pericarditis    Chest pain 08/06/2015   Diabetes mellitus type 2, controlled ([place]) 08/06/2015   Hyperlipidemia 08/06/2015   Hypertension 08/06/2015   CKD (chronic kidney disease) stage 3, GFR 30-59 ml/min (HCC) 08/06/2015   Chronic anemia 08/06/2015   Hyperkalemia    Pain in the chest    Pacemaker    Junctional bradycardia 06/17/2015   Osteoporosis 03/22/2015   Abdominal pain, left lower quadrant 01/07/2012   Syncope 09/15/2011   Junctional escape rhythm 09/15/2011   PELVIC MASS 03/27/2009   ABSCESS OF INTESTINE 02/19/2009   VITAMIN B12 DEFICIENCY 11/21/2007   OBESITY 11/21/2007   ESOPHAGEAL STRICTURE 11/21/2007   HIATAL HERNIA 11/21/2007   UNSPECIFIED INTESTINAL OBSTRUCTION 11/21/2007   DIVERTICULOSIS OF COLON 11/21/2007   OSTEOARTHRITIS 11/21/2007   DIABETES MELLITUS, HX OF 11/21/2007   ANEMIA, HX OF 11/21/2007   HYPERTENSION, HX OF 11/21/2007   REFLUX ESOPHAGITIS, HX OF 11/21/2007   PCP:  Carol Ada, MD Pharmacy:   [facility] [place], [place] - [address] [place] [address] Phone: [phone] Fax: [phone]     Social Determinants of Health (SDOH) Interventions    Readmission Risk Interventions Readmission Risk Prevention Plan 08/20/2020  Transportation Screening Complete  PCP or Specialist Appt within 3-5 Days Complete  HRI or Home Care  Consult Complete  Social Work Consult for [address] Planning/Counseling Complete  Palliative Care Screening Not Applicable  Medication Review [employer]) Complete  Some recent data might be hidden

## 2020-08-20 NOTE — Progress Notes (Signed)
Progress Note  Patient Name: Tricia Gonzales Date of Encounter: 08/20/2020  Primary Cardiologist: Dr. Audie Box, MD   Subjective   Feeling well today. Continues to have atypical "backpain" but otherwise doing well.   Inpatient Medications    Scheduled Meds: . sodium chloride   Intravenous Once  . acidophilus  1 capsule Oral Daily  . amiodarone  200 mg Oral Daily  . apixaban  5 mg Oral BID  . carvedilol  3.125 mg Oral BID WC  . cholecalciferol  1,000 Units Oral Daily  . dorzolamide  1 drop Right Eye BID  . febuxostat  40 mg Oral Q M,W,F  . ferrous sulfate  325 mg Oral BID  . gabapentin  300 mg Oral Daily  . gabapentin  400 mg Oral QHS  . insulin aspart  0-15 Units Subcutaneous TID WC  . insulin aspart  0-5 Units Subcutaneous QHS  . insulin glargine  5 Units Subcutaneous QHS  . levothyroxine  50 mcg Oral QHS  . montelukast  10 mg Oral QHS  . pantoprazole  40 mg Oral BID  . pentoxifylline  400 mg Oral Daily  . polyethylene glycol  17 g Oral Daily  . rOPINIRole  0.5 mg Oral QHS  . rosuvastatin  5 mg Oral Daily  . senna-docusate  1 tablet Oral QHS  . sodium chloride flush  3 mL Intravenous Q12H  . traMADol  50 mg Oral QHS   Continuous Infusions: . sodium chloride     PRN Meds: sodium chloride, acetaminophen, ALPRAZolam, nitroGLYCERIN, nystatin, ondansetron (ZOFRAN) IV, sodium chloride flush, zolpidem   Vital Signs    Vitals:   08/19/20 2002 08/20/20 0358 08/20/20 0513 08/20/20 0938  BP: (!) 115/39  (!) 137/51 (!) 132/45  Pulse: 64  63 65  Resp: _0 Temp: 98.1 F (36.7 C)  98.1 F (36.7 C) 98.3 F (36.8 C)  TempSrc: Oral  Oral Oral  SpO2: 94%  93% 95%  Weight:  79.8 kg    Height:        Intake/Output Summary (Last 24 hours) at 08/20/2020 1002 Last data filed at 08/20/2020 0830 Gross per 24 hour  Intake 937 ml  Output 1600 ml  Net -663 ml   Filed Weights   08/18/20 0039 08/19/20 0251 08/20/20 0358  Weight: 82.4 kg 81.2 kg 79.8 kg    Physical Exam    General: Obese, NAD Neck: Negative for carotid bruits. No JVD Lungs:Clear to ausculation bilaterally. Breathing is unlabored. Cardiovascular: Irregularly irregular. No murmurs Abdomen: Soft, non-tender, non-distended. No obvious abdominal masses. Extremities: No edema. Radial pulses 2+ bilaterally Neuro: Alert and oriented. No focal deficits. No facial asymmetry. MAE spontaneously. Psych: Responds to questions appropriately with normal affect.    Labs    Chemistry Recent Labs  Lab 08/15/20 2129 08/16/20 0619 08/18/20 0414 08/19/20 0245 08/20/20 0347  NA 137   < > 140 137 135  K 5.4*   < > 4.4 3.8 3.6  CL 102   < > 99 95* 91*  CO2 27   < > 32 32 31  GLUCOSE 270*   < > 129* 197* 174*  BUN 83*   < > 67* 72* 81*  CREATININE 2.18*   < > 2.16* 2.43* 2.74*  CALCIUM 9.0   < > 9.1 8.8* 8.9  PROT 5.5*  --   --   --  5.4*  ALBUMIN 3.1*  --   --   --  2.9*  AST 14*  --   --   --  14*  ALT 10  --   --   --  11  ALKPHOS 68  --   --   --  76  BILITOT 0.4  --   --   --  1.3*  GFRNONAA 22*   < > 23* 20* 17*  ANIONGAP 8   < > _0 < > = values in this interval not displayed.     Hematology Recent Labs  Lab 08/18/20 0414 08/19/20 0245 08/20/20 0347  WBC 8.2 8.4 8.6  RBC 3.45* 3.58* 3.63*  HGB 10.7* 10.8* 11.1*  HCT 32.7* 34.6* 34.8*  MCV 94.8 96.6 95.9  MCH 31.0 30.2 30.6  MCHC 32.7 31.2 31.9  RDW 18.9* 17.6* 17.0*  PLT 256 233 218    Cardiac EnzymesNo results for input(s): TROPONINI in the last 168 hours. No results for input(s): TROPIPOC in the last 168 hours.   BNP Recent Labs  Lab 08/15/20 2129  BNP 1,245.9*     DDimer No results for input(s): DDIMER in the last 168 hours.   Radiology    No results found.  Telemetry    08/20/20 AF with rate control- Personally Reviewed  ECG    No new tracing as of 08/20/20- Personally Reviewed  Cardiac Studies   TTE 08/16/2020 1. Left ventricular ejection fraction, by estimation, is 50 to 55%. The  left  ventricle has low normal function. The left ventricle has no regional  wall motion abnormalities.  2. Right ventricular systolic function is normal. The right ventricular  size is normal. There is moderately elevated pulmonary artery systolic  pressure.  3. The mitral valve is normal in structure. Moderate to severe mitral  valve regurgitation. No evidence of mitral stenosis. Moderate mitral  annular calcification.  4. The aortic valve is normal in structure. Aortic valve regurgitation is  not visualized. No aortic stenosis is present.  5. The inferior vena cava is dilated in size with <50% respiratory  variability, suggesting right atrial pressure of 15 mmHg.   Patient Profile     80 y.o. female with a hx of atrial fibrillation, junctional bradycardia status post pacemaker, diabetes, hypertension, CKD stage III/IV, hyperlipidemia who was admitted on 08/15/2020 with decompensated diastolic heart failure.  Found to have GI bleed on admission as well.  Assessment & Plan    1. Acute on chronic diastolic heart failure: -Pt treated aggressively with IV diuretics>>given 37m metolazone and Lasix 1014mIV BID yesterday -Plan to transition to oral treatment today>>transition to 4079mO BID>>may need up titration or the addition of weekly metolazone -Weight, 175lb with an admission weight of 183lb  -I&O, net negative 4.3L -Cr, 2.74 today>>up from 2.43 yesterday with a baseline is between 2.1-2.2.  2. Chest pain/back pain: -Felt to be non-cardiac in etiology, atypical    3. Persistent atrial fibrillation status post cardioversion -Has a hx of gastric AVMs with GI bleed, s/p APC>>>with compliants of dark colored stool.  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

## 2020-08-20 NOTE — Plan of Care (Signed)
Nutrition Education Note  RD consulted for nutrition education regarding CHF and diabetes.  Lab Results  Component Value Date   HGBA1C 6.0 (H) 08/15/2020  RD working remotely.   RD contacted pt via phone this morning. She reports feeling well and hopeful of going home today. Pt recalls eating most of breakfast this morning (pancakes, eggs, fruit, coffee). She reports typically eating a larger breakfast (peaches/cream oatmeal, boiled egg, white toast w/ butter spread, coffee), skips lunch, occasionally will have a yogurt or piece of fruit. She says she is not a "big meat eater" likes fish (salmon, flounder, tuna) recalls eating 2-3 times/week with a salad, vegetables, and a starch.   RD discussed "Heart Healthy, Consistent Carbohydrate Nutrition Therapy" handout from the [employer]. Handout has been attached to discharge instructions. Reviewed patient's dietary recall. Provided examples on ways to decrease sodium intake in diet. Discouraged intake of processed foods and use of salt shaker. Encouraged fresh fruits and vegetables as well as whole grain sources of carbohydrates to maximize fiber intake.   Discussed different food groups and their effects on blood sugar, emphasizing carbohydrate-containing foods. Provided list of carbohydrates and recommended serving sizes of common foods.  Discussed importance of controlled and consistent carbohydrate intake throughout the day. Provided examples of ways to balance meals/snacks and encouraged intake of high-fiber, whole grain complex carbohydrates. Teach back method used.  Expect good compliance.  Body mass index is 36.77 kg/m. Pt meets criteria for obese based on current BMI.  Current diet order is HH/CM, patient is consuming approximately 90-100% of meals at this time. Labs and medications reviewed. No further nutrition interventions warranted at this time. RD contact information provided. If additional nutrition issues  arise, please re-consult RD.   Lajuan Lines, RD, LDN Clinical Nutrition After Hours/Weekend Pager # in [place]

## 2020-08-20 NOTE — TOC Transition Note (Signed)
Transition of Care [facility]) - CM/SW Discharge Note   Patient Details  Name: Tricia Gonzales MRN: [id number] Date of Birth: 18-Dec-1940  Transition of Care [facility]) CM/SW Contact:  Zenon Mayo, RN Phone Number: 08/20/2020, 1:26 PM   Clinical Narrative:    Patient lives alone, she states her daughter and her sister takes her to [facility]. She states she will resume her outpatient pt with [facility]. She has a walker, a cane, w/chair, scale and bp cuff at home.  She states she tries to eat a low NA diet.    Final next level of care: Home/Self Care Barriers to Discharge: No Barriers Identified   Patient Goals and CMS Choice Patient states their goals for this hospitalization and ongoing recovery are:: to get back to where she can do things and stay healthy,   Choice offered to / list presented to : NA  Discharge Placement                       Discharge Plan and Services In-house Referral: NA Discharge Planning Services: CM Consult Post Acute Care Choice: NA            DME Agency: NA       HH Arranged: NA          Social Determinants of Health (SDOH) Interventions     Readmission Risk Interventions Readmission Risk Prevention Plan 08/20/2020  Transportation Screening Complete  PCP or Specialist Appt within 3-5 Days Complete  HRI or Home Care Consult Complete  Social Work Consult for [place] Planning/Counseling Complete  Palliative Care Screening Not Applicable  Medication Review [employer]) Complete  Some recent data might be hidden

## 2020-08-20 NOTE — Discharge Instructions (Signed)
Information on my medicine - ELIQUIS (apixaban)  Why was Eliquis prescribed for you? Eliquis was prescribed for you to reduce the risk of forming blood clots that can cause a stroke if you have a medical condition called atrial fibrillation (a type of irregular heartbeat) OR to reduce the risk of a blood clots forming after orthopedic surgery.  What do You need to know about Eliquis ? Take your Eliquis TWICE DAILY - one tablet in the morning and one tablet in the evening with or without food.  It would be best to take the doses about the same time each day.  If you have difficulty swallowing the tablet whole please discuss with your pharmacist how to take the medication safely.  Take Eliquis exactly as prescribed by your doctor and DO NOT stop taking Eliquis without talking to the doctor who prescribed the medication.  Stopping may increase your risk of developing a new clot or stroke.  Refill your prescription before you run out.  After discharge, you should have regular check-up appointments with your healthcare provider that is prescribing your Eliquis.  In the future your dose may need to be changed if your kidney function or weight changes by a significant amount or as you get older.  What do you do if you miss a dose? If you miss a dose, take it as soon as you remember on the same day and resume taking twice daily.  Do not take more than one dose of ELIQUIS at the same time.  Important Safety Information A possible side effect of Eliquis is bleeding. You should call your healthcare provider right away if you experience any of the following: ? Bleeding from an injury or your nose that does not stop. ? Unusual colored urine (red or dark brown) or unusual colored stools (red or black). ? Unusual bruising for unknown reasons. ? A serious fall or if you hit your head (even if there is no bleeding).  Some medicines may interact with Eliquis and might increase your risk of bleeding  or clotting while on Eliquis. To help avoid this, consult your healthcare provider or pharmacist prior to using any new prescription or non-prescription medications, including herbals, vitamins, non-steroidal anti-inflammatory drugs (NSAIDs) and supplements.  This website has more information on Eliquis (apixaban): [email or web address].   Heart Healthy, Consistent Carbohydrate Nutrition Therapy   A heart-healthy and consistent carbohydrate diet is recommended to manage heart disease and diabetes. To follow a heart-healthy and consistent carbohydrate diet,  Eat a balanced diet with whole grains, fruits and vegetables, and lean protein sources.   Choose heart-healthy unsaturated fats. Limit saturated fats, trans fats, and cholesterol intake. Eat more plant-based or vegetarian meals using beans and soy foods for protein.   Eat whole, unprocessed foods to limit the amount of sodium (salt) you eat.   Choose a consistent amount of carbohydrate at each meal and snack. Limit refined carbohydrates especially sugar, sweets and sugar-sweetened beverages.   If you drink alcohol, do so in moderation: one serving per day (women) and two servings per day (men). o One serving is equivalent to 12 ounces beer, 5 ounces wine, or 1.5 ounces distilled spirits  Tips Tips for Choosing Heart-Healthy Fats Choose lean protein and low-fat dairy foods to reduce saturated fat intake.  Saturated fat is usually found in animal-based protein and is associated with certain health risks. Saturated fat is the biggest contributor to raise low-density lipoprotein (LDL) cholesterol levels. Research shows that limiting saturated fat lowers unhealthy cholesterol  levels. Eat no more than 7% of your total calories each day from saturated fat. Ask your RDN to help you determine how much saturated fat is right for you.  There are many foods that do not contain large amounts of saturated fats. Swapping these foods to replace foods high  in saturated fats will help you limit the saturated fat you eat and improve your cholesterol levels. You can also try eating more plant-based or vegetarian meals. Instead of Try:  Whole milk, cheese, yogurt, and ice cream 1% or skim milk, low-fat cheese, non-fat yogurt, and low-fat ice cream  Fatty, marbled beef and pork Lean beef, pork, or venison  Poultry with skin Poultry without skin  Butter, stick margarine Reduced-fat, whipped, or liquid spreads  Coconut oil, palm oil Liquid vegetable oils: corn, canola, olive, soybean and safflower oils   Avoid foods that contain trans fats.  Trans fats increase levels of LDL-cholesterol. Hydrogenated fat in processed foods is the main source of trans fats in foods.   Trans fats can be found in stick margarine, shortening, processed sweets, baked goods, some fried foods, and packaged foods made with hydrogenated oils. Avoid foods with partially hydrogenated oil on the ingredient list such as: cookies, pastries, baked goods, biscuits, crackers, microwave popcorn, and frozen dinners. Choose foods with heart healthy fats.  Polyunsaturated and monounsaturated fat are unsaturated fats that may help lower your blood cholesterol level when used in place of saturated fat in your diet.  Ask your RDN about taking a dietary supplement with plant sterols and stanols to help lower your cholesterol level.  Research shows that substituting saturated fats with unsaturated fats is beneficial to cholesterol levels. Try these easy swaps: Instead of Try:  Butter, stick margarine, or solid shortening Reduced-fat, whipped, or liquid spreads  Beef, pork, or poultry with skin Fish and seafood  Chips, crackers, snack foods Raw or unsalted nuts and seeds or nut butters Hummus with vegetables Avocado on toast  Coconut oil, palm oil Liquid vegetable oils: corn, canola, olive, soybean and safflower oils  Limit the amount of cholesterol you eat to less than 200 milligrams  per day.  Cholesterol is a substance carried through the bloodstream via lipoproteins, which are known as transporters of fat. Some body functions need cholesterol to work properly, but too much cholesterol in the bloodstream can damage arteries and build up blood vessel linings (which can lead to heart attack and stroke). You should eat less than 200 milligrams cholesterol per day.  People respond differently to eating cholesterol. There is no test available right now that can figure out which people will respond more to dietary cholesterol and which will respond less. For individuals with high intake of dietary cholesterol, different types of increase (none, small, moderate, large) in LDL-cholesterol levels are all possible.   Food sources of cholesterol include egg yolks and organ meats such as liver, gizzards. Limit egg yolks to two to four per week and avoid organ meats like liver and gizzards to control cholesterol intake. Tips for Choosing Heart-Healthy Carbohydrates Consume a consistent amount of carbohydrate  It is important to eat foods with carbohydrates in moderation because they impact your blood glucose level. Carbohydrates can be found in many foods such as:  Grains (breads, crackers, rice, pasta, and cereals)   Starchy Vegetables (potatoes, corn, and peas)   Beans and legumes   Milk, soy milk, and yogurt   Fruit and fruit juice   Sweets (cakes, cookies, ice cream, jam and jelly)  Your RDN will help you set a goal for how many carbohydrate servings to eat at your meals and snacks. For many adults, eating 3 to 5 servings of carbohydrate foods at each meal and 1 or 2 carbohydrate servings for each snack works well.   Check your blood glucose level regularly. It can tell you if you need to adjust when you eat carbohydrates.  Choose foods rich in viscous (soluble) fiber  Viscous, or soluble, is found in the walls of plant cells. Viscous fiber is found only in plant-based  foods. Eating foods with fiber helps to lower your unhealthy cholesterol and keep your blood glucose in range   Rich sources of viscous fiber include vegetables (asparagus, Brussels sprouts, sweet potatoes, turnips) fruit (apricots, mangoes, oranges), legumes, and whole grains (barley, oats, and oat bran).   As you increase your fiber intake gradually, also increase the amount of water you drink. This will help prevent constipation.   If you have difficulty achieving this goal, ask your RDN about fiber laxatives. Choose fiber supplements made with viscous fibers such as psyllium seed husks or methylcellulose to help lower unhealthy cholesterol.   Limit refined carbohydrates   There are three types of carbohydrates: starches, sugar, and fiber. Some carbohydrates occur naturally in food, like the starches in rice or corn or the sugars in fruits and milk. Refined carbohydrates--foods with high amounts of simple sugars--can raise triglyceride levels. High triglyceride levels are associated with coronary heart disease.  Some examples of refined carbohydrate foods are table sugar, sweets, and beverages sweetened with added sugar. Tips for Reducing Sodium (Salt) Although sodium is important for your body to function, too much sodium can be harmful for people with high blood pressure. As sodium and fluid buildup in your tissues and bloodstream, your blood pressure increases. High blood pressure may cause damage to other organs and increase your risk for a stroke. Even if you take a pill for blood pressure or a water pill (diuretic) to remove fluid, it is still important to have less salt in your diet. Ask your doctor and RDN what amount of sodium is right for you.  Avoid processed foods. Eat more fresh foods.   Fresh fruits and vegetables are naturally low in sodium, as well as frozen vegetables and fruits that have no added juices or sauces.   Fresh meats are lower in sodium than processed meats, such  as bacon, sausage, and hotdogs. Read the nutrition label or ask your butcher to help you find a fresh meat that is low in sodium.  Eat less salt--at the table and when cooking.   A single teaspoon of table salt has 2,300 mg of sodium.   Leave the salt out of recipes for pasta, casseroles, and soups.   Ask your RDN how to cook your favorite recipes without sodium  Be a smart shopper.   Look for food packages that say salt-free or sodium-free. These items contain less than 5 milligrams of sodium per serving.   Very low-sodium products contain less than 35 milligrams of sodium per serving.   Low-sodium products contain less than 140 milligrams of sodium per serving.   Beware for Unsalted or No Added Salt products. These items may still be high in sodium. Check the nutrition label.  Add flavors to your food without adding sodium.   Try lemon juice, lime juice, fruit juice or vinegar.   Dry or fresh herbs add flavor. Try basil, bay leaf, dill, rosemary, parsley, sage, dry mustard,  nutmeg, thyme, and paprika.   Pepper, red pepper flakes, and cayenne pepper can add spice t your meals without adding sodium. Hot sauce contains sodium, but if you use just a drop or two, it will not add up to much.   Buy a sodium-free seasoning blend or make your own at home. Additional Lifestyle Tips Achieve and maintain a healthy weight.  Talk with your RDN or your doctor about what is a healthy weight for you.  Set goals to reach and maintain that weight.   To lose weight, reduce your calorie intake along with increasing your physical activity. A weight loss of 10 to 15 pounds could reduce LDL-cholesterol by 5 milligrams per deciliter. Participate in physical activity.  Talk with your health care team to find out what types of physical activity are best for you. Set a plan to get about 30 minutes of exercise on most days.  Foods Recommended Food Group Foods Recommended  Grains Whole  grain breads and cereals, including whole wheat, barley, rye, buckwheat, corn, teff, quinoa, millet, amaranth, brown or wild rice, sorghum, and oats Pasta, especially whole wheat or other whole grain types  [employer], quinoa or wild rice Whole grain crackers, bread, rolls, pitas Home-made bread with reduced-sodium baking soda  Protein Foods Lean cuts of beef and pork (loin, leg, round, extra lean hamburger)  Skinless Cytogeneticist and other wild game Dried beans and peas Nuts and nut butters Meat alternatives made with soy or textured vegetable protein  Egg whites or egg substitute Cold cuts made with lean meat or soy protein  Dairy Nonfat (skim), low-fat, or 1%-fat milk  Nonfat or low-fat yogurt or cottage cheese Fat-free and low-fat cheese  Vegetables Fresh, frozen, or canned vegetables without added fat or salt   Fruits Fresh, frozen, canned, or dried fruit   Oils Unsaturated oils (corn, olive, peanut, soy, sunflower, canola)  Soft or liquid margarines and vegetable oil spreads  Salad dressings Seeds and nuts  Avocado   Foods Not Recommended Food Group Foods Not Recommended  Grains Breads or crackers topped with salt Cereals (hot or cold) with more than 300 mg sodium per serving Biscuits, cornbread, and other quick breads prepared with baking soda Bread crumbs or stuffing mix from a store High-fat bakery products, such as doughnuts, biscuits, croissants, danish pastries, pies, cookies Instant cooking foods to which you add hot water and stir--potatoes, noodles, rice, etc. Packaged starchy foods--seasoned noodle or rice dishes, stuffing mix, macaroni and cheese dinner Snacks made with partially hydrogenated oils, including chips, cheese puffs, snack mixes, regular crackers, butter-flavored popcorn  Protein Foods Higher-fat cuts of meats (ribs, t-bone steak, regular hamburger) Bacon, sausage, or hot dogs Cold cuts, such as salami or bologna, deli meats, cured meats,  corned beef Organ meats (liver, brains, gizzards, sweetbreads) Poultry with skin Fried or smoked meat, poultry, and fish Whole eggs and egg yolks (more than 2-4 per week) Salted legumes, nuts, seeds, or nut/seed butters Meat alternatives with high levels of sodium (>300 mg per serving) or saturated fat (>5 g per serving)  Dairy Whole milk,?2% fat milk, buttermilk Whole milk yogurt or ice cream Cream Half-&-half Cream cheese Sour cream Cheese  Vegetables Canned or frozen vegetables with salt, fresh vegetables prepared with salt, butter, cheese, or cream sauce Fried vegetables Pickled vegetables such as olives, pickles, or sauerkraut  Fruits Fried fruits Fruits served with butter or cream  Oils Butter, stick margarine, shortening Partially hydrogenated oils or trans fats Tropical oils (coconut,  palm, palm kernel oils)  Other Candy, sugar sweetened soft drinks and desserts Salt, sea salt, garlic salt, and seasoning mixes containing salt Bouillon cubes Ketchup, barbecue sauce, Worcestershire sauce, soy sauce, teriyaki sauce Miso Salsa Pickles, olives, relish   Heart Healthy Consistent Carbohydrate Vegetarian (Lacto-Ovo) Sample 1-Day Menu  Breakfast 1 cup oatmeal, cooked (2 carbohydrate servings)   cup blueberries (1 carbohydrate serving)  11 almonds, without salt  1 cup 1% milk (1 carbohydrate serving)  1 cup coffee  Morning Snack 1 cup fat-free plain yogurt (1 carbohydrate serving)  Lunch 1 whole wheat bun (1 carbohydrate servings)  1 black bean burger (1 carbohydrate servings)  1 slice cheddar cheese, low sodium  2 slices tomatoes  2 leaves lettuce  1 teaspoon mustard  1 small pear (1 carbohydrate servings)  1 cup green tea, unsweetened  Afternoon Snack 1/3 cup trail mix with nuts, seeds, and raisins, without salt (1 carbohydrate servinga)  Evening Meal  cup meatless chicken  2/3 cup brown rice, cooked (2 carbohydrate servings)  1 cup broccoli, cooked (2/3  carbohydrate serving)   cup carrots, cooked (1/3 carbohydrate serving)  2 teaspoons olive oil  1 teaspoon balsamic vinegar  1 whole wheat dinner roll (1 carbohydrate serving)  1 teaspoon margarine, soft, tub  1 cup 1% milk (1 carbohydrate serving)  Evening Snack 1 extra small banana (1 carbohydrate serving)  1 tablespoon peanut butter   Heart Healthy Consistent Carbohydrate Vegan Sample 1-Day Menu  Breakfast 1 cup oatmeal, cooked (2 carbohydrate servings)   cup blueberries (1 carbohydrate serving)  11 almonds, without salt  1 cup soymilk fortified with calcium, vitamin B12, and vitamin D  1 cup coffee  Morning Snack 6 ounces soy yogurt (1 carbohydrate servings)  Lunch 1 whole wheat bun(1 carbohydrate servings)  1 black bean burger (1 carbohydrate serving)  2 slices tomatoes  2 leaves lettuce  1 teaspoon mustard  1 small pear (1 carbohydrate servings)  1 cup green tea, unsweetened  Afternoon Snack 1/3 cup trail mix with nuts, seeds, and raisins, without salt (1 carbohydrate servings)  Evening Meal  cup meatless chicken  2/3 cup brown rice, cooked (2 carbohydrate servings)  1 cup broccoli, cooked (2/3 carbohydrate serving)   cup carrots, cooked (1/3 carbohydrate serving)  2 teaspoons olive oil  1 teaspoon balsamic vinegar  1 whole wheat dinner roll (1 carbohydrate serving)  1 teaspoon margarine, soft, tub  1 cup soymilk fortified with calcium, vitamin B12, and vitamin D  Evening Snack 1 extra small banana (1 carbohydrate serving)  1 tablespoon peanut butter    Heart Healthy Consistent Carbohydrate Sample 1-Day Menu  Breakfast 1 cup cooked oatmeal (2 carbohydrate servings)  3/4 cup blueberries (1 carbohydrate serving)  1 ounce almonds  1 cup skim milk (1 carbohydrate serving)  1 cup coffee  Morning Snack 1 cup sugar-free nonfat yogurt (1 carbohydrate serving)  Lunch 2 slices whole-wheat bread (2 carbohydrate servings)  2 ounces lean [place] breast  1 ounce low-fat  Swiss cheese  1 teaspoon mustard  1 slice tomato  1 lettuce leaf  1 small pear (1 carbohydrate serving)  1 cup skim milk (1 carbohydrate serving)  Afternoon Snack 1 ounce trail mix with unsalted nuts, seeds, and raisins (1 carbohydrate serving)  Evening Meal 3 ounces salmon  2/3 cup cooked brown rice (2 carbohydrate servings)  1 teaspoon soft margarine  1 cup cooked broccoli with 1/2 cup cooked carrots (1 carbohydrate serving  Carrots, cooked, boiled, drained, without salt  1  cup lettuce  1 teaspoon olive oil with vinegar for dressing  1 small whole grain roll (1 carbohydrate serving)  1 teaspoon soft margarine  1 cup unsweetened tea  Evening Snack 1 extra-small banana (1 carbohydrate serving)  Copyright 2020  [employer]. All rights reserved.

## 2020-08-20 NOTE — Plan of Care (Signed)
  Problem: Education: Goal: Knowledge of General Education information will improve Description: Including pain rating scale, medication(s)/side effects and non-pharmacologic comfort measures Outcome: Adequate for Discharge

## 2020-08-20 NOTE — Telephone Encounter (Addendum)
Lab order and reminder in epic.   Patient is scheduled for a hospital follow up on Friday, 10/08/20 at 10:30 AM. Letter mailed to patient with appointment information.

## 2020-08-20 NOTE — Discharge Summary (Signed)
Physician Discharge Summary  KENORA SPAYD DJM:[id number] DOB: 15-Oct-1940 DOA: 08/15/2020  PCP: Carol Ada, MD  Admit date: 08/15/2020 Discharge date: 08/20/2020  Admitted From: Home  Discharge disposition: Home  Recommendations for Outpatient Follow-Up:   . Follow up with your primary care provider in 2 to 3 weeks. . Check CBC, BMP, magnesium in the next visit with cardiology. . Fluid restriction 1500 mL/day. . Follow up with cardiology as has been scheduled on 08/28/2020. . Follow-up with GI in 2 weeks.  Discharge Diagnosis:   Principal Problem:   Acute diastolic heart failure (HCC) Active Problems:   Junctional bradycardia   Pacemaker   Diabetes mellitus type 2, controlled (HCC)   CKD (chronic kidney disease) stage 3, GFR 30-59 ml/min (HCC)   Anemia in chronic kidney disease   Type II diabetes mellitus (HCC)   AKI (acute kidney injury) (HCC)   CHF (congestive heart failure) (HCC)   Acute blood loss anemia   Symptomatic anemia   Benign esophageal stricture  Discharge Condition: Improved.  Diet recommendation: Low sodium, heart healthy.  Carbohydrate-modified.    Wound care: None.  Code status: Full.   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  Patient went to see cardiology and was noted to be in uncontrolled atrial fibrillation.  She underwent cardioversion but had been complaining of black-colored stool.  She went to see her cardiologist again due to worsening shortness of breath and was admitted hospital for decompensated congestive heart failure.  She was however noted to have hemoglobin of 6.3 compared to her baseline of around 10.0.  He was given packed RBC and  Eliquis was on hold.  GI was consulted including hospitalist team for further evaluation and treatment.    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  Her hemoglobin remained stable after wards.  She remained volume overloaded and dyspneic so Cardiology was consulted and patient was initiated on IV diuretics.   Hospital Course:   Following conditions were addressed during hospitalization as listed below,  Acute on chronic symptomatic anemia. Has improved at this time.  Latest hemoglobin of 11.1.  During hospitalization, patient received multiple units of packed RBC.  Eliquis was initially on hold.  GI was consulted and patient underwent EGD with esophageal dilation and argon plasma coagulation on 08/17/2020  for gastric antral vascular ectasia.  Subsequently, Eliquis was started which was okayed by GI.Marland Kitchen GI recommended PPI high-dose for 8 weeks.  This will be prescribed on discharge  Esophageal stenosis status post dilatation.     Patient was advanced to diabetic  diet which she tolerated well.  Acute on chronic diastolic CHF decompensation Improved.  Received IV diuretics and metolazone for few days while in the hospital..  She has significantly improved.  Lasix has been changed to torsemide 40 mg daily on discharge .  Heart failure packet has been given to the patient.  History of moderate to severe pulmonary hypertension Follow up with cardiology as outpatient.  Chronic kidney disease stage IIIb.  Baseline creatinine around 2.1-2.2.  Slightly elevated but Cardiology is okay with that and will follow up as outpatient  Diabetes mellitus type 2.   Patient  will resume diabetic diet, Januvia as outpatient.  Hyperlipidemia -Continue statin on discharge.  Hypothyroid -TSH borderline high on Synthroid.   Recommend repeat TSH in 4 to 6 weeks.  Persistent atrial fibrillation   Controlled on amiodarone, continue  Eliquis. Follow-up with cardiology as outpatient.  Disposition.  At this time, patient is stable for disposition home with outpatient PCP, GI and cardiology follow-up.  Spoke with the patient's daughter Ms. [name] on the phone about it.  Medical Consultants:    GI  Cardiology  Procedures:     EGD with esophageal dilatation and argon photocoagulation 2/26>  Subjective:   Today, patient was seen and examined at bedside.  Patient feels okay.  Has mild headache but no shortness of breath but no fever chills or cough.  Has mild nausea.  Discharge Exam:   Vitals:   08/20/20 0938 08/20/20 1121  BP: (!) 132/45 (!) 129/59  Pulse: 65 63  Resp: 12 18  Temp: 98.3 F (36.8 C) 97.9 F (36.6 C)  SpO2: 95% 94%   Vitals:   08/20/20 0358 08/20/20 0513 08/20/20 0938 08/20/20 1121  BP:  (!) 137/51 (!) 132/45 (!) 129/59  Pulse:  63 65 63  Resp:  _0 Temp:  98.1 F (36.7 C) 98.3 F (36.8 C) 97.9 F (36.6 C)  TempSrc:  Oral Oral Oral  SpO2:  93% 95% 94%  Weight: 79.8 kg     Height:       Body mass index is 36.77 kg/m.   General: Alert awake, not in obvious distress, obese, on room air HENT: pupils equally reacting to light,  No scleral pallor or icterus noted. Oral mucosa is moist.  Chest:  Clear breath sounds.  Diminished breath sounds bilaterally.  CVS: S1 &S2 heard. No murmur.  Regular rate and rhythm. Abdomen: Soft, nontender, nondistended.  Bowel sounds are heard.   Extremities: No cyanosis, clubbing or edema.  Peripheral pulses are palpable. Psych: Alert, awake and oriented, normal mood CNS:  No cranial nerve deficits.  Power equal in all extremities.   Skin: Warm and dry.  No rashes noted.  The results of significant diagnostics from this hospitalization (including imaging, microbiology, ancillary and laboratory) are listed below for reference.     Diagnostic Studies:   Portable chest x-ray 1 view  Result Date: 08/15/2020 CLINICAL DATA:  CHF EXAM: PORTABLE  CHEST 1 VIEW COMPARISON:  07/23/2020 FINDINGS: Left-sided pacing device as before. Cardiomegaly with vascular congestion. Aortic atherosclerosis. Trace pleural effusions. No overt pulmonary edema. No pneumothorax. IMPRESSION: Cardiomegaly with vascular congestion and trace pleural effusions. Electronically Signed   By: Donavan Foil M.D.   On: 08/15/2020 21:08   ECHOCARDIOGRAM LIMITED  Result Date: 08/16/2020    ECHOCARDIOGRAM LIMITED REPORT   Patient Name:   GLORIMAR STROOPE Naas Date of Exam: 08/16/2020 Medical Rec #:  [id number]     Height:       58.0 in Accession #:    [id number]    Weight:       181.7 lb Date of Birth:  1940/11/20     BSA:          1.748 m Patient Age:    54 years      BP:           135/38 mmHg Patient Gender: F             HR:           68 bpm. Exam Location:  Inpatient Procedure: 2D  Echo, Cardiac Doppler and Color Doppler Indications:    CHF-Acute Diastolic  History:        Patient has prior history of Echocardiogram examinations.                 Pacemaker, Arrythmias:Atrial Fibrillation,                 Signs/Symptoms:Shortness of Breath; Risk Factors:Diabetes,                 Hypertension and Dyslipidemia. CKD.  Sonographer:    Clayton Lefort RDCS (AE) Referring Phys: Randall  1. Left ventricular ejection fraction, by estimation, is 50 to 55%. The left ventricle has low normal function. The left ventricle has no regional wall motion abnormalities.  2. Right ventricular systolic function is normal. The right ventricular size is normal. There is moderately elevated pulmonary artery systolic pressure.  3. The mitral valve is normal in structure. Moderate to severe mitral valve regurgitation. No evidence of mitral stenosis. Moderate mitral annular calcification.  4. The aortic valve is normal in structure. Aortic valve regurgitation is not visualized. No aortic stenosis is present.  5. The inferior vena cava is dilated in size with <50% respiratory variability, suggesting right  atrial pressure of 15 mmHg. FINDINGS  Left Ventricle: Left ventricular ejection fraction, by estimation, is 50 to 55%. The left ventricle has low normal function. The left ventricle has no regional wall motion abnormalities. The left ventricular internal cavity size was normal in size. There is no left ventricular hypertrophy.  LV Wall Scoring: The apex is akinetic. Right Ventricle: The right ventricular size is normal. No increase in right ventricular wall thickness. Right ventricular systolic function is normal. There is moderately elevated pulmonary artery systolic pressure. The tricuspid regurgitant velocity is 3.07 m/s, and with an assumed right atrial pressure of 15 mmHg, the estimated right ventricular systolic pressure is 48.3 mmHg. Left Atrium: Left atrial size was normal in size. Right Atrium: Right atrial size was normal in size. Pericardium: There is no evidence of pericardial effusion. Mitral Valve: The mitral valve is normal in structure. There is moderate thickening of the mitral valve leaflet(s). There is moderate calcification of the mitral valve leaflet(s). Moderate mitral annular calcification. Moderate to severe mitral valve regurgitation. No evidence of mitral valve stenosis. Tricuspid Valve: The tricuspid valve is normal in structure. Tricuspid valve regurgitation is not demonstrated. No evidence of tricuspid stenosis. Aortic Valve: The aortic valve is normal in structure. Aortic valve regurgitation is not visualized. No aortic stenosis is present. Pulmonic Valve: The pulmonic valve was normal in structure. Pulmonic valve regurgitation is not visualized. No evidence of pulmonic stenosis. Aorta: The aortic root is normal in size and structure. Venous: The inferior vena cava is dilated in size with less than 50% respiratory variability, suggesting right atrial pressure of 15 mmHg. IAS/Shunts: No atrial level shunt detected by color flow Doppler. Additional Comments: A pacer wire is visualized.  LEFT VENTRICLE PLAX 2D LVIDd:         5.30 cm LVIDs:         3.80 cm LV PW:         1.60 cm LV IVS:        1.10 cm LVOT diam:     2.00 cm LVOT Area:     3.14 cm  LV Volumes (MOD) LV vol d, MOD A2C: 120.0 ml LV vol d, MOD A4C: 134.0 ml LV vol s, MOD A2C: 56.8 ml LV vol  s, MOD A4C: 66.6 ml LV SV MOD A2C:     63.2 ml LV SV MOD A4C:     134.0 ml LV SV MOD BP:      64.6 ml IVC IVC diam: 2.40 cm LEFT ATRIUM         Index LA diam:    5.20 cm 2.97 cm/m   AORTA Ao Root diam: 3.50 cm Ao Asc diam:  2.80 cm TRICUSPID VALVE TR Peak grad:   37.7 mmHg TR Vmax:        307.00 cm/s  SHUNTS Systemic Diam: 2.00 cm Candee Furbish MD Electronically signed by Candee Furbish MD Signature Date/Time: 08/16/2020/3:09:53 PM    Final      Labs:   Basic Metabolic Panel: Recent Labs  Lab 08/16/20 7035 08/17/20 0318 08/18/20 0414 08/19/20 0245 08/20/20 0347  NA 139 140 140 137 135  K 4.7 4.6 4.4 3.8 3.6  CL 101 103 99 95* 91*  CO2 27 28 32 32 31  GLUCOSE 152* 145* 129* 197* 174*  BUN 81* 76* 67* 72* 81*  CREATININE 2.16* 2.26* 2.16* 2.43* 2.74*  CALCIUM 8.8* 8.6* 9.1 8.8* 8.9  MG  --   --  2.3 2.1 2.0  PHOS  --   --   --   --  4.8*   GFR Estimated Creatinine Clearance: 14.8 mL/min (A) (by C-G formula based on SCr of 2.74 mg/dL (H)). Liver Function Tests: Recent Labs  Lab 08/15/20 2129 08/20/20 0347  AST 14* 14*  ALT 10 11  ALKPHOS 68 76  BILITOT 0.4 1.3*  PROT 5.5* 5.4*  ALBUMIN 3.1* 2.9*   No results for input(s): LIPASE, AMYLASE in the last 168 hours. No results for input(s): AMMONIA in the last 168 hours. Coagulation profile No results for input(s): INR, PROTIME in the last 168 hours.  CBC: Recent Labs  Lab 08/15/20 2129 08/16/20 0619 08/16/20 1454 08/16/20 2003 08/17/20 0318 08/18/20 0414 08/19/20 0245 08/20/20 0347  WBC 8.0   < > 8.3  --  6.8 8.2 8.4 8.6  NEUTROABS 6.7  --   --   --   --   --   --   --   HGB 6.3*   < > 7.8* 8.0* 8.6* 10.7* 10.8* 11.1*  HCT 20.9*   < > 24.8* 25.5* 26.6*  32.7* 34.6* 34.8*  MCV 103.5*   < > 99.6  --  96.4 94.8 96.6 95.9  PLT 336   < > 302  --  239 256 233 218   < > = values in this interval not displayed.   Cardiac Enzymes: No results for input(s): CKTOTAL, CKMB, CKMBINDEX, TROPONINI in the last 168 hours. BNP: Invalid input(s): POCBNP CBG: Recent Labs  Lab 08/19/20 1133 08/19/20 1608 08/19/20 2103 08/20/20 0618 08/20/20 1119  GLUCAP 192* 176* 171* 144* 201*   D-Dimer No results for input(s): DDIMER in the last 72 hours. Hgb A1c No results for input(s): HGBA1C in the last 72 hours. Lipid Profile No results for input(s): CHOL, HDL, LDLCALC, TRIG, CHOLHDL, LDLDIRECT in the last 72 hours. Thyroid function studies No results for input(s): TSH, T4TOTAL, T3FREE, THYROIDAB in the last 72 hours.  Invalid input(s): FREET3 Anemia work up No results for input(s): VITAMINB12, FOLATE, FERRITIN, TIBC, IRON, RETICCTPCT in the last 72 hours. Microbiology Recent Results (from the past 240 hour(s))  Resp Panel by RT PCR (RSV, Flu A&B, Covid) -     Status: None   Collection Time: 08/15/20 11:40 PM  Result Value Ref Range Status   SARS Coronavirus 2 by RT PCR NEGATIVE NEGATIVE Final   Influenza A by PCR NEGATIVE NEGATIVE Final   Influenza B by PCR NEGATIVE NEGATIVE Final   Respiratory Syncytial Virus by PCR NEGATIVE NEGATIVE Final    Comment: Performed at [facility], [address] [place], [place] [address]     Discharge Instructions:   Discharge Instructions     (HEART FAILURE PATIENTS) Call MD:  Anytime you have any of the following symptoms: 1) 3 pound weight gain in 24 hours or 5 pounds in 1 week 2) shortness of breath, with or without a dry hacking cough 3) swelling in the hands, feet or stomach 4) if you have to sleep on extra pillows at night in order to breathe.   Complete by: As directed    Diet - low sodium heart healthy   Complete by: As directed    Fluid restriction 1500 mL/day   Diet Carb Modified   Complete by:  As directed    Discharge instructions   Complete by: As directed    Follow up with cardiology on 08/28/2020. Follow-up with your primary care provider in 2 to 3 weeks for regular checkup.  Follow-up with GI which in 2 weeks(office to contact). If you witness any further bleeding please seek medical attention. Stay on a low salt diet. Fluid restriction 1500 mL a day.   Heart Failure patients record your daily weight using the same scale at the same time of day   Complete by: As directed    Increase activity slowly   Complete by: As directed    STOP any activity that causes chest pain, shortness of breath, dizziness, sweating, or exessive weakness   Complete by: As directed       Allergies as of 08/20/2020       Reactions   Norvasc [amlodipine Besylate] Shortness Of Breath   Other Rash, Other (See Comments)   Pt voiced she is allergic to microfiber materials/ blankets/sheets!!   Avapro [irbesartan] Other (See Comments)   Headaches   Glucophage [metformin Hydrochloride] Other (See Comments)   "increases creatinine"   Lisinopril    Headache   Losartan Other (See Comments)   Headaches   Morphine And Related Other (See Comments)   "Hallucinations"   Simvastatin Other (See Comments)   Body aches   Trulicity [dulaglutide] Other (See Comments)   Severe mood swings        Medication List     STOP taking these medications    furosemide 40 MG tablet Commonly known as: LASIX       TAKE these medications    Accu-Chek Aviva Plus test strip Generic drug: glucose blood 1 each by Other route daily.   acetaminophen 650 MG CR tablet Commonly known as: TYLENOL Take 1,300 mg by mouth every evening.   amiodarone 200 MG tablet Commonly known as: PACERONE Take 1 tablet (200 mg total) by mouth daily.   BD Pen Needle Nano U/F 32G X 4 MM Misc Generic drug: Insulin Pen Needle See admin instructions.   carvedilol 3.125 MG tablet Commonly known as: COREG Take 2 tablets in the AM and  1 tablet in the PM What changed:   how much to take  how to take this  when to take this  additional instructions   dorzolamide 2 % ophthalmic solution Commonly known as: TRUSOPT Place 1 drop into the right eye 2 (two) times daily.   Eliquis 5  MG Tabs tablet Generic drug: apixaban TAKE ONE TABLET TWICE DAILY What changed: how much to take   febuxostat 40 MG tablet Commonly known as: ULORIC Take 1 tablet (40 mg total) by mouth every Monday, Wednesday, and Friday.   ferrous sulfate 325 (65 FE) MG EC tablet Take 1 tablet (325 mg total) by mouth 2 (two) times daily.   gabapentin 300 MG capsule Commonly known as: NEURONTIN Take 1 capsule (300 mg total) by mouth daily. What changed: when to take this   gabapentin 400 MG capsule Commonly known as: NEURONTIN Take 1 capsule (400 mg total) by mouth at bedtime. What changed: Another medication with the same name was changed. Make sure you understand how and when to take each.   Januvia 25 MG tablet Generic drug: sitaGLIPtin Take 25 mg by mouth daily. What changed: Another medication with the same name was removed. Continue taking this medication, and follow the directions you see here.   levothyroxine 50 MCG tablet Commonly known as: SYNTHROID Take 1 tablet (50 mcg total) by mouth at bedtime.   lidocaine 5 % Commonly known as: LIDODERM Place 1-3 patches onto the skin See admin instructions. Apply 1-3 patches transdermally every 24 hours as needed for pain and remove & discard patches within 12 hours or as directed by MD   MIRALAX PO Take 17 g by mouth See admin instructions. Mix 17 grams of powder into a desired beverage and drink every morning   montelukast 10 MG tablet Commonly known as: SINGULAIR Take 1 tablet (10 mg total) by mouth at bedtime. What changed: when to take this   nystatin powder Commonly known as: Nyamyc APPLY TOPICALLY TO LEFT ABDOMINAL FOLD AND GROIN TWICE A DAY What changed:   how much to  take  how to take this  when to take this  reasons to take this  additional instructions   pantoprazole 40 MG tablet Commonly known as: PROTONIX Take 1 tablet (40 mg total) by mouth 2 (two) times daily before a meal for 28 days, THEN 1 tablet (40 mg total) daily. Start taking on: August 20, 2020 What changed: See the new instructions.   pentoxifylline 400 MG CR tablet Commonly known as: TRENTAL Take 1 tablet (400 mg total) by mouth daily.   PROBIOTIC DAILY PO Take 1 capsule by mouth daily.   rOPINIRole 0.5 MG tablet Commonly known as: REQUIP Take 0.5 mg by mouth at bedtime.   rosuvastatin 5 MG tablet Commonly known as: CRESTOR Take 1 tablet (5 mg total) by mouth daily. What changed: when to take this   senna-docusate 8.6-50 MG tablet Commonly known as: Senokot-S Take 1 tablet by mouth at bedtime.   Torsemide 40 MG Tabs Take 40 mg by mouth daily.   traMADol 50 MG tablet Commonly known as: ULTRAM Take 50 mg by mouth See admin instructions. Take 50 mg by mouth at bedtime and an additional 50 mg once a day as needed for pain   Tresiba FlexTouch 100 UNIT/ML FlexTouch Pen Generic drug: insulin degludec Inject 10 Units into the skin every evening.   Vitamin D3 25 MCG (1000 UT) Caps Take 1,000 Units by mouth daily.        Follow-up Information     O'Neal, Cassie Freer, MD Follow up on 08/28/2020.   Specialties: Internal Medicine, Cardiology, Radiology Why: at [facility] information: [place] [address] [phone]         Carol Ada, MD. Schedule an appointment as soon as  possible for a visit in 2 week(s).   Specialty: Family Medicine Why: regular followup Contact information: [place] [address] [phone]         Evans Lance, MD .   Specialty: Cardiology Contact information: [phone] [address] [place] [address] [phone]         Lavena Bullion, DO.  Schedule an appointment as soon as possible for a visit in 2 week(s).   Specialty: Gastroenterology Why: for GI bleed followup. (call office if you dont hear in few days) Contact information: [place] [address] [phone]                  Time coordinating discharge: 39 minutes  Signed:  Marymargaret Kirker  [facility] 08/20/2020, 1:00 PM

## 2020-08-20 NOTE — Care Management Important Message (Addendum)
Important Message  Patient Details  Name: Tricia Gonzales MRN: [id number] Date of Birth: 13-Aug-1940   Medicare Important Message Given:  Yes  Pt. D/C mailed IM letter to Pt. Home address.     [place], [place] 08/20/2020, 12:05 PM

## 2020-08-20 NOTE — Progress Notes (Signed)
Heart Failure Navigator Progress Note  Assessed for Heart & Vascular TOC clinic readiness. Unfortunately, at this time, the patient does not meet criteria due to impaired renal function.   Navigator available for reassessment of patient.   Pricilla Holm, RN, BSN Heart Failure Nurse Navigator [phone]

## 2020-08-20 NOTE — Telephone Encounter (Signed)
-----  Message from Yetta Flock, MD sent at 08/19/2020 12:59 PM EST ----- Thank you Luanna Salk!  Scarlettrose Costilow can you please help coordinate as outlined. Thanks  ----- Message ----- From: Lavena Bullion, DO Sent: 08/17/2020   4:00 PM EST To: Yetta Flock, MD, Yevette Edwards, RN  Patient admitted over the weekend with acute on chronic anemia 2/2 gave in the setting of anticoagulation.  Transfused 3 units PRBCs.  EGD today with APC.  Also balloon dilation of distal esophageal stricture (has been having intermittent dysphagia).  Should get discharged home over the weekend.  Needs the following: -Repeat CBC in 7-10 days to ensure returning back to baseline -Follow-up in the GI clinic in ~6 weeks or so -Probably only needs repeat EGD if recurrence of anemia or dysphagia not resolved (although I dilated with 20 mm TTS, so if ongoing dysphagia, maybe esophageal manometry instead)  Thanks

## 2020-08-26 ENCOUNTER — Telehealth: Payer: Self-pay | Admitting: *Deleted

## 2020-08-26 NOTE — Telephone Encounter (Signed)
Hospital discharge w/labs on 08/20/20 (Hgb 11.1). She is asking if it is necessary to have lab with her next Retacrit injection on 08/30/20? Last dose was 07/16/20. Noted admission with shortness of breath and peripheral edema and black stools. 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

## 2020-08-27 ENCOUNTER — Telehealth: Payer: Self-pay

## 2020-08-27 ENCOUNTER — Other Ambulatory Visit: Payer: Medicare Other

## 2020-08-27 ENCOUNTER — Ambulatory Visit: Payer: Medicare Other

## 2020-08-27 NOTE — Telephone Encounter (Signed)
-----  Message from Yevette Edwards, RN sent at 08/20/2020 10:07 AM EST ----- Regarding: Labs Repeat CBC. Order in epic.

## 2020-08-27 NOTE — Telephone Encounter (Signed)
Pt is requesting a call back from a nurse, pt did not disclose any further info

## 2020-08-27 NOTE — Telephone Encounter (Signed)
Spoke with patient to remind her that she is due for repeat labs at this time. Patient is aware that no appointment is necessary. She is aware that she can stop by the lab at her convenience between 7:30 AM - 5 PM. Patient verbalized understanding of this information and had no concerns at the end of the call.

## 2020-08-27 NOTE — Telephone Encounter (Signed)
Called patient back and informed her that Dr. Benay Spice still wants her to come in for lab/injection on 08/30/20. Will have lab moved to prior to injection.

## 2020-08-27 NOTE — Telephone Encounter (Signed)
Patient states that she saw her PCP today who drew a full panel of lab work, she states that PCP will send over the results for [name] to review. Request for most recent lab results have been faxed to Dr. Thompson Caul office at [phone].

## 2020-08-28 ENCOUNTER — Ambulatory Visit: Payer: Medicare Other | Admitting: Cardiovascular Disease

## 2020-08-29 NOTE — Progress Notes (Signed)
Cardiology Office Note:   Date:  08/30/2020  NAME:  Tricia Gonzales    MRN: [id number] DOB:  1941/06/16   PCP:  Carol Ada, MD  Cardiologist:  Evalina Field, MD  Electrophysiologist:  Cristopher Peru, MD   Referring MD: Carol Ada, MD   Chief Complaint  Patient presents with  . Congestive Heart Failure         History of Present Illness:   Tricia Gonzales is a 80 y.o. female with a hx of HFpEF, Afib, DM, HTN, HLD, CKD IIIb, anemia who presents for follow-up. Recent admission for HFpEF and GI bleed. Net negative 4.9L during that admission.  She is had her repeat labs drawn by her nephrologist.  Creatinine is down to 2.22.  BUN still around 111.  Her potassium appears to be acceptable.  She reports she was told to take torsemide only as needed for 3 pound weight gain in a day.  She apparently has gained 3 pounds back since her hospitalization.  I recommended she discuss with her nephrologist about taking torsemide.  She reports she has reservations to do this to affect her kidneys.  Hemoglobin values have dropped a bit.  She is still on Eliquis.  No overt signs of bleeding.  She has plans to follow-up with her GI doctor next month.  She is maintaining sinus rhythm.  She reports she feels better since having fluid removal.  Overall appears to be doing well.  She reports she does not want hemodialysis even if it was needed.  She appears to not be needing it or have an indication currently.  She will continue to follow with nephrology.  She denies chest pain and shortness of breath today.  Problem List 1. Paroxysmal Afib -CHADSVASC=5 -DCCV 07/31/2020 2. HFpEF 3. 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 DM -A1c 6.5 5. HTN 6. 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 HLD -T chol 151, HDL 44, LDL 84, TG 131 8. 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  Past Medical History: Past Medical History:  Diagnosis Date  . Anemia   . 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 Anxiety   . Arthritis   . Atrial  fibrillation ([place])   . Avascular necrosis (HCC) hip left   leg pain also  . Chronic back pain   . Chronic kidney disease (CKD), stage III (moderate) (HCC)   . Depression   . Diverticulosis   . Early cataracts, bilateral   . Esophageal stricture   . Gastritis   . GERD (gastroesophageal reflux disease)   . Glaucoma   . Heart murmur     " some doctors say that I have one some say that I don"t "  . Hiatal hernia   . History of blood transfusion    "@ least w/1st knee OR"  . History of gout   . History of kidney stones   . Hyperlipidemia   . Hypertension   . Intestinal obstruction ([place])   . Kidney stones   . Obesity   . Osteoarthritis   . Osteoporosis   . Pericarditis 2016  . Presence of permanent cardiac pacemaker   . Sjogren's disease ([place])   . Thyroid disease   . Type II diabetes mellitus ([place])   . Vitamin B12 deficiency     Past Surgical History: Past Surgical History:  Procedure Laterality Date  . ABDOMINAL HYSTERECTOMY     complete  . CARDIOVERSION N/A 07/31/2020   Procedure: CARDIOVERSION;  Surgeon: Werner Lean, MD;  Location:  [facility];  Service: Cardiovascular;  Laterality: N/A;  . CHOLECYSTECTOMY OPEN    . COLECTOMY     for rectovaginal fistula  . COLONOSCOPY  01/07/2012   Procedure: COLONOSCOPY;  Surgeon: Lafayette Dragon, MD;  Location: [facility];  Service: Endoscopy;  Laterality: N/A;  . EP IMPLANTABLE DEVICE N/A 06/18/2015   Procedure: Pacemaker Implant;  Surgeon: Evans Lance, MD;  Location: [place] CV [facility];  Service: Cardiovascular;  Laterality: N/A;  . ESOPHAGEAL DILATION  08/17/2020   Procedure: ESOPHAGEAL DILATION;  Surgeon: Lavena Bullion, DO;  Location: [facility] ENDOSCOPY;  Service: Gastroenterology;;  . ESOPHAGOGASTRODUODENOSCOPY N/A 11/24/2018   Procedure: ESOPHAGOGASTRODUODENOSCOPY (EGD);  Surgeon: Yetta Flock, MD;  Location: [facility];  Service: Gastroenterology;  Laterality: N/A;  . ESOPHAGOGASTRODUODENOSCOPY (EGD) WITH  ESOPHAGEAL DILATION    . ESOPHAGOGASTRODUODENOSCOPY (EGD) WITH PROPOFOL N/A 08/12/2015   Procedure: ESOPHAGOGASTRODUODENOSCOPY (EGD) WITH PROPOFOL;  Surgeon: Manus Gunning, MD;  Location: [place] [facility];  Service: Gastroenterology;  Laterality: N/A;  . ESOPHAGOGASTRODUODENOSCOPY (EGD) WITH PROPOFOL N/A 08/17/2020   Procedure: ESOPHAGOGASTRODUODENOSCOPY (EGD) WITH PROPOFOL;  Surgeon: Lavena Bullion, DO;  Location: [facility];  Service: Gastroenterology;  Laterality: N/A;  . EYE SURGERY Bilateral    cataracts  . HOT HEMOSTASIS N/A 11/24/2018   Procedure: HOT HEMOSTASIS (ARGON PLASMA COAGULATION/BICAP);  Surgeon: Yetta Flock, MD;  Location: [facility];  Service: Gastroenterology;  Laterality: N/A;  . HOT HEMOSTASIS N/A 08/17/2020   Procedure: HOT HEMOSTASIS (ARGON PLASMA COAGULATION/BICAP);  Surgeon: Lavena Bullion, DO;  [facility];  Service: Gastroenterology;  Laterality: N/A;  . I & D EXTREMITY Right 08/06/2016   Procedure: DEBRIDEMENT PIP RIGHT RING FINGER;  Surgeon: Daryll Brod, MD;  Location: [place];  Service: Orthopedics;  Laterality: Right;  . INSERT / REPLACE / REMOVE PACEMAKER    . JOINT REPLACEMENT    . KNEE ARTHROSCOPY Right   . MASS EXCISION Right 08/06/2016   Procedure: EXCISION CYST;  Surgeon: Daryll Brod, MD;  Location: [place];  Service: Orthopedics;  Laterality: Right;  . REVISION TOTAL KNEE ARTHROPLASTY Left   . TOTAL KNEE ARTHROPLASTY Bilateral     Current Medications: Current Meds  Medication Sig  . ACCU-CHEK AVIVA PLUS test strip 1 each by Other route daily.   [place] Kitchen acetaminophen (TYLENOL) 650 MG CR tablet Take 1,300 mg by mouth every evening.  [place] Kitchen amiodarone (PACERONE) 200 MG tablet Take 1 tablet (200 mg total) by mouth daily.  . BD PEN NEEDLE NANO U/F 32G X 4 MM MISC See admin instructions.  . carvedilol (COREG) 3.125 MG tablet Take 2 tablets in the AM and 1 tablet in the PM (Patient taking differently: Take 3.125-6.25 mg by mouth See admin  instructions. Take 6.25 mg by mouth in the morning and 3.125 mg at bedtime)  . cephALEXin (KEFLEX) 250 MG capsule Take 250 mg by mouth daily.  . Cholecalciferol (VITAMIN D3) 25 MCG (1000 UT) CAPS Take 1,000 Units by mouth daily.  . dorzolamide (TRUSOPT) 2 % ophthalmic solution Place 1 drop into the right eye 2 (two) times daily.  [place] Kitchen ELIQUIS 5 MG TABS tablet TAKE ONE TABLET TWICE DAILY  . febuxostat (ULORIC) 40 MG tablet Take 1 tablet (40 mg total) by mouth every Monday, Wednesday, and Friday.  . ferrous sulfate 325 (65 FE) MG EC tablet Take 1 tablet (325 mg total) by mouth 2 (two) times daily.  [place] Kitchen gabapentin (NEURONTIN) 300 MG capsule Take 1 capsule (300 mg total) by mouth daily.  [place] Kitchen gabapentin (NEURONTIN)  400 MG capsule Take 1 capsule (400 mg total) by mouth at bedtime.  [place] Kitchen JANUVIA 25 MG tablet Take 25 mg by mouth daily.  [place] Kitchen levothyroxine (SYNTHROID) 50 MCG tablet Take 1 tablet (50 mcg total) by mouth at bedtime.  . lidocaine (LIDODERM) 5 % Place 1-3 patches onto the skin See admin instructions. Apply 1-3 patches transdermally every 24 hours as needed for pain and remove & discard patches within 12 hours or as directed by MD  . montelukast (SINGULAIR) 10 MG tablet Take 1 tablet (10 mg total) by mouth at bedtime.  [place] Kitchen nystatin (NYAMYC) powder APPLY TOPICALLY TO LEFT ABDOMINAL FOLD AND GROIN TWICE A DAY (Patient taking differently: Apply 1 application topically daily as needed (for rashes- abdominal folds and groin area).)  . pantoprazole (PROTONIX) 40 MG tablet Take 1 tablet (40 mg total) by mouth 2 (two) times daily before a meal for 28 days, THEN 1 tablet (40 mg total) daily.  . pentoxifylline (TRENTAL) 400 MG CR tablet Take 1 tablet (400 mg total) by mouth daily.  . Polyethylene Glycol 3350 (MIRALAX PO) Take 17 g by mouth See admin instructions. Mix 17 grams of powder into a desired beverage and drink every morning  . Probiotic Product (PROBIOTIC DAILY PO) Take 1 capsule by mouth daily.  [place] Kitchen rOPINIRole  (REQUIP) 0.5 MG tablet Take 0.5 mg by mouth at bedtime.  . rosuvastatin (CRESTOR) 5 MG tablet Take 1 tablet (5 mg total) by mouth daily.  [place] Kitchen senna-docusate (SENOKOT-S) 8.6-50 MG tablet Take 1 tablet by mouth at bedtime.  . Torsemide 40 MG TABS Take 40 mg by mouth as needed.  . traMADol (ULTRAM) 50 MG tablet Take 50 mg by mouth See admin instructions. Take 50 mg by mouth at bedtime and an additional 50 mg once a day as needed for pain  . TRESIBA FLEXTOUCH 100 UNIT/ML FlexTouch Pen Inject 10 Units into the skin every evening.     Allergies:    Norvasc [amlodipine besylate], Other, Avapro [irbesartan], Glucophage [metformin hydrochloride], Lisinopril, Losartan, Morphine and related, Simvastatin, and Trulicity [dulaglutide]   Social History: Social History   Socioeconomic History  . Marital status: Divorced    Spouse name: Not on file  . Number of children: 2  . Years of education: Not on file  . Highest education level: Not on file  Occupational History  . Occupation: retired  Tobacco Use  . Smoking status: Former Smoker    Packs/day: 2.00    Years: 3.00    Pack years: 6.00    Types: Cigarettes    Quit date: 06/23/1983    Years since quitting: 37.2  . Smokeless tobacco: Never Used  Vaping Use  . Vaping Use: Never used  Substance and Sexual Activity  . Alcohol use: Yes    Alcohol/week: 2.0 standard drinks    Types: 2 Glasses of wine per week    Comment: occ  . Drug use: No  . Sexual activity: Not Currently  Other Topics Concern  . Not on file  Social History Narrative  . Not on file   Social Determinants of Health   Financial Resource Strain: Not on file  Food Insecurity: Not on file  Transportation Needs: Not on file  Physical Activity: Not on file  Stress: Not on file  Social Connections: Not on file     Family History: The patient's family history includes Diabetes in her brother and mother; Hypertension in her sister. There is no history of Colon cancer, Heart  attack,  Stroke, Colon polyps, Esophageal cancer, Rectal cancer, or Stomach cancer.  ROS:   All other ROS reviewed and negative. Pertinent positives noted in the HPI.     EKGs/Labs/Other Studies Reviewed:   The following studies were personally reviewed by me today:  TTE 08/16/2020 1. Left ventricular ejection fraction, by estimation, is 50 to 55%. The  left ventricle has low normal function. The left ventricle has no regional  wall motion abnormalities.  2. Right ventricular systolic function is normal. The right ventricular  size is normal. There is moderately elevated pulmonary artery systolic  pressure.  3. The mitral valve is normal in structure. Moderate to severe mitral  valve regurgitation. No evidence of mitral stenosis. Moderate mitral  annular calcification.  4. The aortic valve is normal in structure. Aortic valve regurgitation is  not visualized. No aortic stenosis is present.  5. The inferior vena cava is dilated in size with <50% respiratory  variability, suggesting right atrial pressure of 15 mmHg.   Recent Labs: 08/15/2020: B Natriuretic Peptide 1,245.9; TSH 5.867 08/20/2020: ALT 11; BUN 81; Creatinine, Ser 2.74; Magnesium 2.0; Potassium 3.6; Sodium 135 08/30/2020: Hemoglobin 9.8; Platelet Count 280   Recent Lipid Panel No results found for: CHOL, TRIG, HDL, CHOLHDL, VLDL, LDLCALC, LDLDIRECT  Physical Exam:   VS:  BP (!) 144/80 (BP Location: Right Arm, Patient Position: Sitting)   Pulse 84   Ht _0  (1.499 m)   Wt 178 lb (80.7 kg)   SpO2 94%   BMI 35.95 kg/m    Wt Readings from Last 3 Encounters:  08/30/20 178 lb (80.7 kg)  08/20/20 175 lb 14.8 oz (79.8 kg)  08/15/20 184 lb (83.5 kg)    General: Well nourished, well developed, in no acute distress Head: Atraumatic, normal size  Eyes: PEERLA, EOMI  Neck: Supple, JVD 7-8 cm of water Endocrine: No thryomegaly Cardiac: Normal S1, S2; RRR; no murmurs, rubs, or gallops Lungs: Clear to auscultation  bilaterally, no wheezing, rhonchi or rales  Abd: Soft, nontender, no hepatomegaly  Ext: 1+ lower extremity edema Musculoskeletal: No deformities, BUE and BLE strength normal and equal Skin: Warm and dry, no rashes   Neuro: Alert and oriented to person, place, time, and situation, CNII-XII grossly intact, no focal deficits  Psych: Normal mood and affect   ASSESSMENT:   Tricia Gonzales is a 80 y.o. female who presents for the following: 1. Chronic diastolic heart failure (HCC)   2. Persistent atrial fibrillation ([place])   3. Adequate anticoagulation on anticoagulant therapy   4. Stage 3b chronic kidney disease ([place])   5. Sinus node dysfunction (HCC)     PLAN:   1. Chronic diastolic heart failure ([place]) -Recent admitted for diastolic heart failure.  Also had significant AKI.  She has been instructed by nephrology to take torsemide as needed.  Weights are up 3 pounds.  I informed her that it would be a good idea to start back on torsemide today.  She reports she would like to discuss this with her nephrologist.  I think this is reasonable.  She has stage IV kidney disease and no plans for dialysis.  We will let nephrology manage her diuretics.  2. Persistent atrial fibrillation ([place]) 3. Adequate anticoagulation on anticoagulant therapy -CHADSVASC=5 -DCCV 07/31/2020 -She did not tolerate A. fib well.  I think this was driven by heart failure.  We have decided on a rhythm control strategy with amiodarone.  She is not a candidate for Tikosyn or other agents.  I  think this is the best option for her.  We will have to have a yearly chest x-ray and she will see an eye doctor yearly. -Continue Eliquis 5 mg twice daily.  Recent GI bleed.  We are mindful of this.  Hemoglobin apparently still going down.  No overt signs of bleeding.  We will continue for now.  If it is decided she is not a good candidate for anticoagulation we will accept this.  4. Stage 3b chronic kidney disease ([place]) -Close to stage IV.   Followed by nephrology.  5. Sinus node dysfunction (HCC) -Status post pacemaker implantation.  No issues.  Disposition: Return in about 3 months (around 11/30/2020).  Medication Adjustments/Labs and Tests Ordered: Current medicines are reviewed at length with the patient today.  Concerns regarding medicines are outlined above.  No orders of the defined types were placed in this encounter.  No orders of the defined types were placed in this encounter.   Patient Instructions  Medication Instructions:  The current medical regimen is effective;  continue present plan and medications.  *If you need a refill on your cardiac medications before your next appointment, please call your pharmacy*   Follow-Up: At [facility], you and your health needs are our priority.  As part of our continuing mission to provide you with exceptional heart care, we have created designated Provider Care Teams.  These Care Teams include your primary Cardiologist (physician) and Advanced Practice Providers (APPs -  Physician Assistants and Nurse Practitioners) who all work together to provide you with the care you need, when you need it.  We recommend signing up for the patient portal called "MyChart".  Sign up information is provided on this After Visit Summary.  MyChart is used to connect with patients for Virtual Visits (Telemedicine).  Patients are able to view lab/test results, encounter notes, upcoming appointments, etc.  Non-urgent messages can be sent to your provider as well.   To learn more about what you can do with MyChart, go to [email or web address].    Your next appointment:   3 month(s)  The format for your next appointment:   In Person  Provider:   Eleonore Chiquito, MD        Time Spent with Patient: I have spent a total of 25 minutes with patient reviewing hospital notes, telemetry, EKGs, labs and examining the patient as well as establishing an assessment and plan that was discussed  with the patient.  > 50% of time was spent in direct patient care.  Signed, Addison Naegeli. Audie Box, MD, [place]  [address] [place], [place] [address] [phone]  08/30/2020 2:56 PM

## 2020-08-30 ENCOUNTER — Other Ambulatory Visit: Payer: Self-pay

## 2020-08-30 ENCOUNTER — Inpatient Hospital Stay: Payer: Medicare Other | Attending: Oncology

## 2020-08-30 ENCOUNTER — Other Ambulatory Visit: Payer: Medicare Other

## 2020-08-30 ENCOUNTER — Inpatient Hospital Stay: Payer: Medicare Other

## 2020-08-30 ENCOUNTER — Encounter: Payer: Self-pay | Admitting: Cardiovascular Disease

## 2020-08-30 ENCOUNTER — Ambulatory Visit (INDEPENDENT_AMBULATORY_CARE_PROVIDER_SITE_OTHER): Payer: Medicare Other | Admitting: Cardiovascular Disease

## 2020-08-30 VITALS — BP 144/80 | HR 84 | Ht 59.0 in | Wt 178.0 lb

## 2020-08-30 VITALS — BP 144/79 | HR 86 | Temp 97.7°F | Resp 16

## 2020-08-30 DIAGNOSIS — D631 Anemia in chronic kidney disease: Secondary | ICD-10-CM | POA: Insufficient documentation

## 2020-08-30 DIAGNOSIS — Z79899 Other long term (current) drug therapy: Secondary | ICD-10-CM | POA: Insufficient documentation

## 2020-08-30 DIAGNOSIS — I4891 Unspecified atrial fibrillation: Secondary | ICD-10-CM | POA: Insufficient documentation

## 2020-08-30 DIAGNOSIS — Z7901 Long term (current) use of anticoagulants: Secondary | ICD-10-CM

## 2020-08-30 DIAGNOSIS — N183 Chronic kidney disease, stage 3 unspecified: Secondary | ICD-10-CM | POA: Insufficient documentation

## 2020-08-30 DIAGNOSIS — M3500 Sicca syndrome, unspecified: Secondary | ICD-10-CM | POA: Diagnosis not present

## 2020-08-30 DIAGNOSIS — M8949 Other hypertrophic osteoarthropathy, multiple sites: Secondary | ICD-10-CM | POA: Diagnosis not present

## 2020-08-30 DIAGNOSIS — N189 Chronic kidney disease, unspecified: Secondary | ICD-10-CM

## 2020-08-30 DIAGNOSIS — I4819 Other persistent atrial fibrillation: Secondary | ICD-10-CM

## 2020-08-30 DIAGNOSIS — D649 Anemia, unspecified: Secondary | ICD-10-CM

## 2020-08-30 DIAGNOSIS — E1121 Type 2 diabetes mellitus with diabetic nephropathy: Secondary | ICD-10-CM | POA: Diagnosis not present

## 2020-08-30 DIAGNOSIS — N1832 Chronic kidney disease, stage 3b: Secondary | ICD-10-CM | POA: Diagnosis not present

## 2020-08-30 DIAGNOSIS — I5032 Chronic diastolic (congestive) heart failure: Secondary | ICD-10-CM

## 2020-08-30 DIAGNOSIS — I495 Sick sinus syndrome: Secondary | ICD-10-CM

## 2020-08-30 LAB — CBC WITH DIFFERENTIAL (CANCER CENTER ONLY)
Abs Immature Granulocytes: 0.03 10*3/uL (ref 0.00–0.07)
Basophils Absolute: 0 10*3/uL (ref 0.0–0.1)
Basophils Relative: 0 %
Eosinophils Absolute: 0.1 10*3/uL (ref 0.0–0.5)
Eosinophils Relative: 1 %
HCT: 30.1 % — ABNORMAL LOW (ref 36.0–46.0)
Hemoglobin: 9.8 g/dL — ABNORMAL LOW (ref 12.0–15.0)
Immature Granulocytes: 0 %
Lymphocytes Relative: 5 %
Lymphs Abs: 0.4 10*3/uL — ABNORMAL LOW (ref 0.7–4.0)
MCH: 30.6 pg (ref 26.0–34.0)
MCHC: 32.6 g/dL (ref 30.0–36.0)
MCV: 94.1 fL (ref 80.0–100.0)
Monocytes Absolute: 0.5 10*3/uL (ref 0.1–1.0)
Monocytes Relative: 7 %
Neutro Abs: 6.4 10*3/uL (ref 1.7–7.7)
Neutrophils Relative %: 87 %
Platelet Count: 280 10*3/uL (ref 150–400)
RBC: 3.2 MIL/uL — ABNORMAL LOW (ref 3.87–5.11)
RDW: 15.5 % (ref 11.5–15.5)
WBC Count: 7.5 10*3/uL (ref 4.0–10.5)
nRBC: 0 % (ref 0.0–0.2)

## 2020-08-30 MED ORDER — EPOETIN ALFA-EPBX 40000 UNIT/ML IJ SOLN
40000.0000 [IU] | Freq: Once | INTRAMUSCULAR | Status: AC
  Administered 2020-08-30: 40000 [IU] via SUBCUTANEOUS

## 2020-08-30 MED ORDER — EPOETIN ALFA-EPBX 40000 UNIT/ML IJ SOLN
INTRAMUSCULAR | Status: AC
  Filled 2020-08-30: qty 1

## 2020-08-30 MED ORDER — EPOETIN ALFA-EPBX 10000 UNIT/ML IJ SOLN
20000.0000 [IU] | Freq: Once | INTRAMUSCULAR | Status: AC
  Administered 2020-08-30: 20000 [IU] via SUBCUTANEOUS

## 2020-08-30 MED ORDER — EPOETIN ALFA-EPBX 10000 UNIT/ML IJ SOLN
INTRAMUSCULAR | Status: AC
  Filled 2020-08-30: qty 2

## 2020-08-30 NOTE — Patient Instructions (Signed)
Medication Instructions:  The current medical regimen is effective;  continue present plan and medications.  *If you need a refill on your cardiac medications before your next appointment, please call your pharmacy*   Follow-Up: At [facility], you and your health needs are our priority.  As part of our continuing mission to provide you with exceptional heart care, we have created designated Provider Care Teams.  These Care Teams include your primary Cardiologist (physician) and Advanced Practice Providers (APPs -  Physician Assistants and Nurse Practitioners) who all work together to provide you with the care you need, when you need it.  We recommend signing up for the patient portal called "MyChart".  Sign up information is provided on this After Visit Summary.  MyChart is used to connect with patients for Virtual Visits (Telemedicine).  Patients are able to view lab/test results, encounter notes, upcoming appointments, etc.  Non-urgent messages can be sent to your provider as well.   To learn more about what you can do with MyChart, go to [email or web address].    Your next appointment:   3 month(s)  The format for your next appointment:   In Person  Provider:   Eleonore Chiquito, MD

## 2020-08-30 NOTE — Patient Instructions (Signed)

## 2020-09-02 ENCOUNTER — Telehealth: Payer: Self-pay | Admitting: Gastroenterology

## 2020-09-02 DIAGNOSIS — D649 Anemia, unspecified: Secondary | ICD-10-CM

## 2020-09-02 NOTE — Telephone Encounter (Signed)
Lab order and reminder in epic.

## 2020-09-02 NOTE — Telephone Encounter (Signed)
Received labs for this patient:  Done 08/27/20  Hgb 11.2, MCV 92, WBC 7.5, plt 295  She had another blood draw per Dr. Ammie Dalton on 3/11, I think she got IV iron at that visit?  [place] she should have another CBC in one week to ensure stable if you can help coordinate. Thanks

## 2020-09-06 ENCOUNTER — Telehealth: Payer: Self-pay

## 2020-09-06 NOTE — Telephone Encounter (Signed)
Spoke with patient to remind her that she is due for repeat labs early next week. Patient is aware that no appointment is necessary and she can stop by the lab in the basement at her convenience between 7:30 AM - 5 PM. Patient verbalized understanding and had no concerns at the end of the call.

## 2020-09-06 NOTE — Telephone Encounter (Signed)
-----  Message from Yevette Edwards, RN sent at 09/02/2020  8:32 AM EDT ----- Regarding: Labs Repeat CBC, order in epic.

## 2020-09-10 ENCOUNTER — Other Ambulatory Visit: Payer: Self-pay

## 2020-09-10 ENCOUNTER — Ambulatory Visit (INDEPENDENT_AMBULATORY_CARE_PROVIDER_SITE_OTHER): Payer: Medicare Other | Admitting: Internal Medicine

## 2020-09-10 ENCOUNTER — Encounter: Payer: Self-pay | Admitting: Internal Medicine

## 2020-09-10 VITALS — BP 146/68 | HR 65 | Ht 59.0 in | Wt 180.6 lb

## 2020-09-10 DIAGNOSIS — I495 Sick sinus syndrome: Secondary | ICD-10-CM

## 2020-09-10 DIAGNOSIS — I10 Essential (primary) hypertension: Secondary | ICD-10-CM

## 2020-09-10 DIAGNOSIS — I480 Paroxysmal atrial fibrillation: Secondary | ICD-10-CM | POA: Diagnosis not present

## 2020-09-10 DIAGNOSIS — Z950 Presence of cardiac pacemaker: Secondary | ICD-10-CM | POA: Diagnosis not present

## 2020-09-10 NOTE — Progress Notes (Signed)
HPI Mrs. Gilmer returns today for evaluation of sinus node dysfunction s/p PPM insertion. She has morbid obesity and has had trouble with her ambulation and was wheel chair bound until a few months ago when she finally started working again. She also has HTN, diastolic CHF and chronic renal insufficiency. No syncope. She has a remote h/o atrial fib. She has continued to slowly lose weight. She is down almost a 100 lbs.She underwent DCCV almost a month ago.  She feels well except for difficulty with sleep. She wakes up early. Allergies  Allergen Reactions  . Norvasc [Amlodipine Besylate] Shortness Of Breath  . Other Rash and Other (See Comments)    Pt voiced she is allergic to microfiber materials/ blankets/sheets!!  . Avapro [Irbesartan] Other (See Comments)    Headaches  . Glucophage [Metformin Hydrochloride] Other (See Comments)    "increases creatinine"  . Lisinopril     Headache   . Losartan Other (See Comments)    Headaches   . Morphine And Related Other (See Comments)    "Hallucinations"  . Simvastatin Other (See Comments)    Body aches  . Trulicity [Dulaglutide] Other (See Comments)    Severe mood swings     Current Outpatient Medications  Medication Sig Dispense Refill  . ACCU-CHEK AVIVA PLUS test strip 1 each by Other route daily.     Marland Kitchen acetaminophen (TYLENOL) 650 MG CR tablet Take 1,300 mg by mouth every evening.    Marland Kitchen amiodarone (PACERONE) 200 MG tablet Take 1 tablet (200 mg total) by mouth daily. 90 tablet 3  . BD PEN NEEDLE NANO U/F 32G X 4 MM MISC See admin instructions.    . carvedilol (COREG) 3.125 MG tablet Take 2 tablets in the AM and 1 tablet in the PM 75 tablet 1  . Cholecalciferol (VITAMIN D3) 25 MCG (1000 UT) CAPS Take 1,000 Units by mouth daily.    . dorzolamide (TRUSOPT) 2 % ophthalmic solution Place 1 drop into the right eye 2 (two) times daily. 10 mL 0  . ELIQUIS 5 MG TABS tablet TAKE ONE TABLET TWICE DAILY 180 tablet 1  . febuxostat (ULORIC) 40  MG tablet Take 1 tablet (40 mg total) by mouth every Monday, Wednesday, and Friday. 12 tablet 0  . ferrous sulfate 325 (65 FE) MG EC tablet Take 1 tablet (325 mg total) by mouth 2 (two) times daily. 60 tablet 3  . gabapentin (NEURONTIN) 300 MG capsule Take 1 capsule (300 mg total) by mouth daily. 30 capsule 0  . gabapentin (NEURONTIN) 400 MG capsule Take 1 capsule (400 mg total) by mouth at bedtime. 30 capsule 0  . JANUVIA 25 MG tablet Take 25 mg by mouth daily.    Marland Kitchen levothyroxine (SYNTHROID) 50 MCG tablet Take 1 tablet (50 mcg total) by mouth at bedtime. 30 tablet 0  . lidocaine (LIDODERM) 5 % Place 1-3 patches onto the skin See admin instructions. Apply 1-3 patches transdermally every 24 hours as needed for pain and remove & discard patches within 12 hours or as directed by MD    . montelukast (SINGULAIR) 10 MG tablet Take 1 tablet (10 mg total) by mouth at bedtime. 30 tablet 0  . nystatin (NYAMYC) powder APPLY TOPICALLY TO LEFT ABDOMINAL FOLD AND GROIN TWICE A DAY 15 g 0  . pantoprazole (PROTONIX) 40 MG tablet Take 1 tablet (40 mg total) by mouth 2 (two) times daily before a meal for 28 days, THEN 1 tablet (40 mg total)  daily. 146 tablet 0  . pentoxifylline (TRENTAL) 400 MG CR tablet Take 1 tablet (400 mg total) by mouth daily. 30 tablet 0  . Polyethylene Glycol 3350 (MIRALAX PO) Take 17 g by mouth See admin instructions. Mix 17 grams of powder into a desired beverage and drink every morning    . Probiotic Product (PROBIOTIC DAILY PO) Take 1 capsule by mouth daily.    Marland Kitchen rOPINIRole (REQUIP) 0.5 MG tablet Take 0.5 mg by mouth at bedtime.    . rosuvastatin (CRESTOR) 5 MG tablet Take 1 tablet (5 mg total) by mouth daily. 30 tablet 0  . senna-docusate (SENOKOT-S) 8.6-50 MG tablet Take 1 tablet by mouth at bedtime.    . Torsemide 40 MG TABS Take 40 mg by mouth as needed (fluid).    . traMADol (ULTRAM) 50 MG tablet Take 50 mg by mouth See admin instructions. Take 50 mg by mouth at bedtime and an  additional 50 mg once a day as needed for pain    . TRESIBA FLEXTOUCH 100 UNIT/ML FlexTouch Pen Inject 10 Units into the skin every evening.     No current facility-administered medications for this visit.     Past Medical History:  Diagnosis Date  . Anemia   . 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 Anxiety   . Arthritis   . Atrial fibrillation ([place])   . Avascular necrosis (HCC) hip left   leg pain also  . Chronic back pain   . Chronic kidney disease (CKD), stage III (moderate) (HCC)   . Depression   . Diverticulosis   . Early cataracts, bilateral   . Esophageal stricture   . Gastritis   . GERD (gastroesophageal reflux disease)   . Glaucoma   . Heart murmur     " some doctors say that I have one some say that I don"t "  . Hiatal hernia   . History of blood transfusion    "@ least w/1st knee OR"  . History of gout   . History of kidney stones   . Hyperlipidemia   . Hypertension   . Intestinal obstruction ([place])   . Kidney stones   . Obesity   . Osteoarthritis   . Osteoporosis   . Pericarditis 2016  . Presence of permanent cardiac pacemaker   . Sjogren's disease ([place])   . Thyroid disease   . Type II diabetes mellitus ([place])   . Vitamin B12 deficiency     ROS:   All systems reviewed and negative except as noted in the HPI.   Past Surgical History:  Procedure Laterality Date  . ABDOMINAL HYSTERECTOMY     complete  . CARDIOVERSION N/A 07/31/2020   Procedure: CARDIOVERSION;  Surgeon: Werner Lean, MD;  Location: [facility];  Service: Cardiovascular;  Laterality: N/A;  . CHOLECYSTECTOMY OPEN    . COLECTOMY     for rectovaginal fistula  . COLONOSCOPY  01/07/2012   Procedure: COLONOSCOPY;  Surgeon: Lafayette Dragon, MD;  Location: [facility];  Service: Endoscopy;  Laterality: N/A;  . EP IMPLANTABLE DEVICE N/A 06/18/2015   Procedure: Pacemaker Implant;  Surgeon: Evans Lance, MD;  Location: [facility];  Service: Cardiovascular;  Laterality:  N/A;  . ESOPHAGEAL DILATION  08/17/2020   Procedure: ESOPHAGEAL DILATION;  Surgeon: Lavena Bullion, DO;  Location: [facility];  Service: Gastroenterology;;  . ESOPHAGOGASTRODUODENOSCOPY N/A 11/24/2018   Procedure: ESOPHAGOGASTRODUODENOSCOPY (EGD);  Surgeon: Yetta Flock, MD;  Location: [facility];  Service: Gastroenterology;  Laterality: N/A;  . ESOPHAGOGASTRODUODENOSCOPY (EGD) WITH ESOPHAGEAL DILATION    . ESOPHAGOGASTRODUODENOSCOPY (EGD) WITH PROPOFOL N/A 08/12/2015   Procedure: ESOPHAGOGASTRODUODENOSCOPY (EGD) WITH PROPOFOL;  Surgeon: Manus Gunning, MD;  Location: [facility];  Service: Gastroenterology;  Laterality: N/A;  . ESOPHAGOGASTRODUODENOSCOPY (EGD) WITH PROPOFOL N/A 08/17/2020   Procedure: ESOPHAGOGASTRODUODENOSCOPY (EGD) WITH PROPOFOL;  Surgeon: Lavena Bullion, DO;  Location: [facility];  Service: Gastroenterology;  Laterality: N/A;  . EYE SURGERY Bilateral    cataracts  . HOT HEMOSTASIS N/A 11/24/2018   Procedure: HOT HEMOSTASIS (ARGON PLASMA COAGULATION/BICAP);  Surgeon: Yetta Flock, MD;  Location: [facility];  Service: Gastroenterology;  Laterality: N/A;  . HOT HEMOSTASIS N/A 08/17/2020   Procedure: HOT HEMOSTASIS (ARGON PLASMA COAGULATION/BICAP);  Surgeon: Lavena Bullion, DO;  Location: [facility]COPY;  Service: Gastroenterology;  Laterality: N/A;  . I & D EXTREMITY Right 08/06/2016   Procedure: DEBRIDEMENT PIP RIGHT RING FINGER;  Surgeon: Daryll Brod, MD;  Location: [facility];  Service: Orthopedics;  Laterality: Right;  . INSERT / REPLACE / REMOVE PACEMAKER    . JOINT REPLACEMENT    . KNEE ARTHROSCOPY Right   . MASS EXCISION Right 08/06/2016   Procedure: EXCISION CYST;  Surgeon: Daryll Brod, MD;  Location: [facility];  Service: Orthopedics;  Laterality: Right;  . REVISION TOTAL KNEE ARTHROPLASTY Left   . TOTAL KNEE ARTHROPLASTY Bilateral      Family History  Problem Relation Age of Onset  . Diabetes Mother   . Diabetes Brother   .  Hypertension Sister   . Colon cancer Neg Hx   . Heart attack Neg Hx   . Stroke Neg Hx   . Colon polyps Neg Hx   . Esophageal cancer Neg Hx   . Rectal cancer Neg Hx   . Stomach cancer Neg Hx      Social History   Socioeconomic History  . Marital status: Divorced    Spouse name: Not on file  . Number of children: 2  . Years of education: Not on file  . Highest education level: Not on file  Occupational History  . Occupation: retired  Tobacco Use  . Smoking status: Former Smoker    Packs/day: 2.00    Years: 3.00    Pack years: 6.00    Types: Cigarettes    Quit date: 06/23/1983    Years since quitting: 37.2  . Smokeless tobacco: Never Used  Vaping Use  . Vaping Use: Never used  Substance and Sexual Activity  . Alcohol use: Yes    Alcohol/week: 2.0 standard drinks    Types: 2 Glasses of wine per week    Comment: occ  . Drug use: No  . Sexual activity: Not Currently  Other Topics Concern  . Not on file  Social History Narrative  . Not on file   Social Determinants of Health   Financial Resource Strain: Not on file  Food Insecurity: Not on file  Transportation Needs: Not on file  Physical Activity: Not on file  Stress: Not on file  Social Connections: Not on file  Intimate Partner Violence: Not on file     BP (!) 146/68   Pulse 65   Ht _0  (1.499 m)   Wt 180 lb 9.6 oz (81.9 kg)   SpO2 97%   BMI 36.48 kg/m   Physical Exam:  Well appearing NAD HEENT: Unremarkable Neck:  No JVD, no thyromegally Lymphatics:  No adenopathy Back:  No CVA tenderness Lungs:  Clear with no wheezes  HEART:  Regular rate rhythm, no murmurs, no rubs, no clicks Abd:  soft, positive bowel sounds, no organomegally, no rebound, no guarding Ext:  2 plus pulses, no edema, no cyanosis, no clubbing Skin:  No rashes no nodules Neuro:  CN II through XII intact, motor grossly intact  DEVICE  Normal device function.  See PaceArt for details.   Assess/Plan: 1. Persistent atrial fib -  she is maintaining NSR. She will continue amiodarone 200 mg daily. 2. CHB - she is asymptomatic, s/p PPM Insertion.  3. Chronic diastolic heart failure - her symptoms are class 2. She is encouraged to maintain a low sodium diet, take her meds and to lose weight.  Carleene Overlie Taylor,MD

## 2020-09-10 NOTE — Patient Instructions (Signed)
Medication Instructions:  Your physician recommends that you continue on your current medications as directed. Please refer to the Current Medication list given to you today.  Labwork: None ordered.  Testing/Procedures: None ordered.  Follow-Up: Your physician wants you to follow-up in: one year with Cristopher Peru, MD or one of the following Advanced Practice Providers on your designated Care Team:    [name], NP  Tommye Standard, PA-C  Legrand Como "Jonni Sanger" [place], [place]  Remote monitoring is used to monitor your Pacemaker from home. This monitoring reduces the number of office visits required to check your device to one time per year. It allows [place] to keep an eye on the functioning of your device to ensure it is working properly. You are scheduled for a device check from home on 10/04/2020. You may send your transmission at any time that day. If you have a wireless device, the transmission will be sent automatically. After your physician reviews your transmission, you will receive a postcard with your next transmission date.  Any Other Special Instructions Will Be Listed Below (If Applicable).  If you need a refill on your cardiac medications before your next appointment, please call your pharmacy.

## 2020-09-11 ENCOUNTER — Other Ambulatory Visit (INDEPENDENT_AMBULATORY_CARE_PROVIDER_SITE_OTHER): Payer: Medicare Other

## 2020-09-11 ENCOUNTER — Ambulatory Visit: Payer: Medicare Other

## 2020-09-11 ENCOUNTER — Other Ambulatory Visit: Payer: Medicare Other

## 2020-09-11 DIAGNOSIS — D649 Anemia, unspecified: Secondary | ICD-10-CM | POA: Diagnosis not present

## 2020-09-11 LAB — CBC WITH DIFFERENTIAL/PLATELET
Basophils Absolute: 0 10*3/uL (ref 0.0–0.1)
Basophils Relative: 0.3 % (ref 0.0–3.0)
Eosinophils Absolute: 0.1 10*3/uL (ref 0.0–0.7)
Eosinophils Relative: 1.2 % (ref 0.0–5.0)
HCT: 29.6 % — ABNORMAL LOW (ref 36.0–46.0)
Hemoglobin: 9.8 g/dL — ABNORMAL LOW (ref 12.0–15.0)
Lymphocytes Relative: 6.1 % — ABNORMAL LOW (ref 12.0–46.0)
Lymphs Abs: 0.4 10*3/uL — ABNORMAL LOW (ref 0.7–4.0)
MCHC: 33.2 g/dL (ref 30.0–36.0)
MCV: 92 fl (ref 78.0–100.0)
Monocytes Absolute: 0.6 10*3/uL (ref 0.1–1.0)
Monocytes Relative: 9.6 % (ref 3.0–12.0)
Neutro Abs: 5.4 10*3/uL (ref 1.4–7.7)
Neutrophils Relative %: 82.8 % — ABNORMAL HIGH (ref 43.0–77.0)
Platelets: 257 10*3/uL (ref 150.0–400.0)
RBC: 3.21 Mil/uL — ABNORMAL LOW (ref 3.87–5.11)
RDW: 17.7 % — ABNORMAL HIGH (ref 11.5–15.5)
WBC: 6.6 10*3/uL (ref 4.0–10.5)

## 2020-09-16 ENCOUNTER — Other Ambulatory Visit (HOSPITAL_COMMUNITY): Payer: Self-pay | Admitting: Nurse Practitioner

## 2020-09-16 ENCOUNTER — Other Ambulatory Visit: Payer: Self-pay | Admitting: Internal Medicine

## 2020-09-16 LAB — CUP PACEART INCLINIC DEVICE CHECK
Battery Remaining Longevity: 36 mo
Battery Voltage: 2.97 V
Brady Statistic AP VP Percent: 92.1 %
Brady Statistic AP VS Percent: 0.04 %
Brady Statistic AS VP Percent: 7.84 %
Brady Statistic AS VS Percent: 0.01 %
Brady Statistic RA Percent Paced: 92.11 %
Brady Statistic RV Percent Paced: 99.92 %
Date Time Interrogation Session: 20220323100610
Implantable Lead Implant Date: 20161227
Implantable Lead Implant Date: 20161227
Implantable Lead Location: 753859
Implantable Lead Location: 753860
Implantable Lead Model: 5076
Implantable Lead Model: 5076
Implantable Pulse Generator Implant Date: 20161227
Lead Channel Impedance Value: 342 Ohm
Lead Channel Impedance Value: 361 Ohm
Lead Channel Impedance Value: 418 Ohm
Lead Channel Impedance Value: 437 Ohm
Lead Channel Pacing Threshold Amplitude: 0.75 V
Lead Channel Pacing Threshold Amplitude: 1.25 V
Lead Channel Pacing Threshold Pulse Width: 0.4 ms
Lead Channel Pacing Threshold Pulse Width: 0.4 ms
Lead Channel Sensing Intrinsic Amplitude: 1.125 mV
Lead Channel Sensing Intrinsic Amplitude: 20.625 mV
Lead Channel Setting Pacing Amplitude: 2 V
Lead Channel Setting Pacing Amplitude: 2.75 V
Lead Channel Setting Pacing Pulse Width: 0.4 ms
Lead Channel Setting Sensing Sensitivity: 2.8 mV

## 2020-09-17 ENCOUNTER — Inpatient Hospital Stay (HOSPITAL_BASED_OUTPATIENT_CLINIC_OR_DEPARTMENT_OTHER): Payer: Medicare Other | Admitting: Nurse Practitioner

## 2020-09-17 ENCOUNTER — Inpatient Hospital Stay: Payer: Medicare Other

## 2020-09-17 ENCOUNTER — Encounter: Payer: Self-pay | Admitting: Nurse Practitioner

## 2020-09-17 ENCOUNTER — Other Ambulatory Visit: Payer: Self-pay

## 2020-09-17 VITALS — BP 151/76 | HR 82 | Temp 97.8°F | Resp 18 | Ht 59.0 in | Wt 184.1 lb

## 2020-09-17 VITALS — BP 150/72 | HR 78 | Temp 97.5°F | Resp 17

## 2020-09-17 DIAGNOSIS — N183 Chronic kidney disease, stage 3 unspecified: Secondary | ICD-10-CM

## 2020-09-17 DIAGNOSIS — N189 Chronic kidney disease, unspecified: Secondary | ICD-10-CM

## 2020-09-17 DIAGNOSIS — D631 Anemia in chronic kidney disease: Secondary | ICD-10-CM

## 2020-09-17 LAB — CBC WITH DIFFERENTIAL (CANCER CENTER ONLY)
Abs Immature Granulocytes: 0.03 10*3/uL (ref 0.00–0.07)
Basophils Absolute: 0 10*3/uL (ref 0.0–0.1)
Basophils Relative: 0 %
Eosinophils Absolute: 0 10*3/uL (ref 0.0–0.5)
Eosinophils Relative: 1 %
HCT: 31.3 % — ABNORMAL LOW (ref 36.0–46.0)
Hemoglobin: 9.8 g/dL — ABNORMAL LOW (ref 12.0–15.0)
Immature Granulocytes: 1 %
Lymphocytes Relative: 5 %
Lymphs Abs: 0.3 10*3/uL — ABNORMAL LOW (ref 0.7–4.0)
MCH: 30.2 pg (ref 26.0–34.0)
MCHC: 31.3 g/dL (ref 30.0–36.0)
MCV: 96.6 fL (ref 80.0–100.0)
Monocytes Absolute: 0.4 10*3/uL (ref 0.1–1.0)
Monocytes Relative: 6 %
Neutro Abs: 5.6 10*3/uL (ref 1.7–7.7)
Neutrophils Relative %: 87 %
Platelet Count: 231 10*3/uL (ref 150–400)
RBC: 3.24 MIL/uL — ABNORMAL LOW (ref 3.87–5.11)
RDW: 16.2 % — ABNORMAL HIGH (ref 11.5–15.5)
WBC Count: 6.4 10*3/uL (ref 4.0–10.5)
nRBC: 0 % (ref 0.0–0.2)

## 2020-09-17 LAB — CMP (CANCER CENTER ONLY)
ALT: 6 U/L (ref 0–44)
AST: 14 U/L — ABNORMAL LOW (ref 15–41)
Albumin: 3.5 g/dL (ref 3.5–5.0)
Alkaline Phosphatase: 101 U/L (ref 38–126)
Anion gap: 11 (ref 5–15)
BUN: 72 mg/dL — ABNORMAL HIGH (ref 8–23)
CO2: 27 mmol/L (ref 22–32)
Calcium: 9 mg/dL (ref 8.9–10.3)
Chloride: 96 mmol/L — ABNORMAL LOW (ref 98–111)
Creatinine: 1.95 mg/dL — ABNORMAL HIGH (ref 0.44–1.00)
GFR, Estimated: 26 mL/min — ABNORMAL LOW (ref >60)
Glucose, Bld: 185 mg/dL — ABNORMAL HIGH (ref 70–99)
Potassium: 4.7 mmol/L (ref 3.5–5.1)
Sodium: 134 mmol/L — ABNORMAL LOW (ref 135–145)
Total Bilirubin: 0.4 mg/dL (ref 0.3–1.2)
Total Protein: 6.6 g/dL (ref 6.5–8.1)

## 2020-09-17 LAB — IRON AND TIBC
Iron: 43 ug/dL (ref 41–142)
Saturation Ratios: 13 % — ABNORMAL LOW (ref 21–57)
TIBC: 333 ug/dL (ref 236–444)
UIBC: 290 ug/dL (ref 120–384)

## 2020-09-17 LAB — FERRITIN: Ferritin: 183 ng/mL (ref 11–307)

## 2020-09-17 MED ORDER — EPOETIN ALFA-EPBX 40000 UNIT/ML IJ SOLN
40000.0000 [IU] | Freq: Once | INTRAMUSCULAR | Status: AC
  Administered 2020-09-17: 40000 [IU] via SUBCUTANEOUS

## 2020-09-17 MED ORDER — EPOETIN ALFA-EPBX 40000 UNIT/ML IJ SOLN
INTRAMUSCULAR | Status: AC
  Filled 2020-09-17: qty 1

## 2020-09-17 MED ORDER — EPOETIN ALFA-EPBX 10000 UNIT/ML IJ SOLN
INTRAMUSCULAR | Status: AC
  Filled 2020-09-17: qty 2

## 2020-09-17 MED ORDER — EPOETIN ALFA-EPBX 10000 UNIT/ML IJ SOLN
20000.0000 [IU] | Freq: Once | INTRAMUSCULAR | Status: AC
  Administered 2020-09-17: 20000 [IU] via SUBCUTANEOUS

## 2020-09-17 NOTE — Telephone Encounter (Signed)
Pt last saw [name] 09/10/20, last labs 08/20/20 Creat 2.74, age 80, weight 81.9kg, based on specified criteria pt is on appropriate dosage of Eliquis 7m BID.  Will refill rx.

## 2020-09-17 NOTE — Progress Notes (Signed)
[facility] OFFICE PROGRESS NOTE   Diagnosis: Anemia, chronic renal failure, GAVE  INTERVAL HISTORY:   [name] returns as scheduled.  She continues every 2-week erythropoietin.  She was hospitalized 08/15/2020 through 08/20/2020 with decompensated heart failure, found to have a hemoglobin of 6.3.  She was transfused RBCs.  Eliquis was placed on hold.  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  She was diuresed.  Eliquis resumed.  High-dose PPI recommended for 8 weeks.  She feels "much better".  She continues to have dyspnea on exertion.  She reports her stools are chronically black due to oral iron.  Objective:  Vital signs in last 24 hours:  Blood pressure (!) 151/76, pulse 82, temperature 97.8 F (36.6 C), temperature source Tympanic, resp. rate 18, height _0  (1.499 m), weight 184 lb 1.6 oz (83.5 kg), SpO2 97 %.    Resp: Faint rales at both lung bases.  No respiratory distress. Cardio: Irregular GI: Abdomen soft and nontender.  No hepatosplenomegaly. Vascular: Trace edema at the lower leg bilaterally.    Lab Results:  Lab Results  Component Value Date   WBC 6.4 09/17/2020   HGB 9.8 (L) 09/17/2020   HCT 31.3 (L) 09/17/2020   MCV 96.6 09/17/2020   PLT 231 09/17/2020   NEUTROABS 5.6 09/17/2020    Imaging:  No results found.  Medications: I have reviewed the patient's current medications.  Assessment/Plan: 1. 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; quantitative  immunoglobulins show IgG mildly decreased 654 (range 220 069 6136), IgA and IgM in normal range; polyclonal serum light chain elevation  11/15/2015-hemoglobin 11.2  11/29/2015-hemoglobin 11.1  12/12/2005-hemoglobin 10.4; Aranesp 100 g every 2 weeks initiated  02/07/2016-hemoglobin 11.0, Aranesp held, changed to an every 3 week schedule  Progressive anemia and decreased iron stores December 2017, treated with IV iron January 2018 with improvement  She did not require Aranesp for 2 months and resumed Aranesp on a monthly schedule for 08/2016  Aranesp every 6 weeks beginning 05/04/2017  Progressive anemia and decreased iron stores 07/05/2017; IV irongiven2/11/2017 and 08/06/2017  Persistent severe anemia, Aranesp change to every 2 weeks beginning 10/19/2017  Persistent anemia, decreased iron stores 11/30/2017  IV iron 12/13/2017, 12/20/2017,  Decreased iron stores 05/17/2018  IV iron1/08/2018  07/05/2018-no serum M spike, polyclonal serum light chain elevation, IgG mildly decreased with IgA and IgM within normal range  2 units of blood 08/17/2018  IV iron 08/17/2018 and 08/24/2018  Stool heme +08/30/2018, referred to GI  2 units of blood 09/01/2018  1 unit of blood 10/25/2018  11/11/2018 colonoscopy-diverticulosis in the entire examined colon. End-to-end colocolonic anastomosis. One 5 mm polyp in the ascending colon. No cause for anemia.  11/11/2018 upper endoscopy-evidence of gastric antral vascular ectasia which is the likely cause of the patient's worsening anemia.  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  Hemoglobin stabilized following treatment of theGave  Every 6-week erythropoietin 06/22/2019, erythropoietin dose increased to 60,000 units 09/14/2019  Erythropoietin 60,000 units every 3 weeks 10/24/2019  Schedule changed to every 4 weeks 12/15/2019  IV iron 02/21/2020 and 02/29/2020, 06/25/2020,  07/03/2020  Erythropoietin adjusted to every 2 weeks beginning 05/23/2020  2. Chronic renal insufficiency  3. Atrial fibrillation-maintained on apixaban  4. Osteoarthritis  5. Diabetes  6. Sjogren's disease  7. Pericarditis February 2017  8.Colonoscopy 01/07/2012-prior segmental colectomy in the sigmoid colon; moderate diverticulosis throughout the colon; otherwise normal examination   Disposition: Tricia Gonzales appears stable.  We reviewed the CBC from today.  Hemoglobin is stable.  Plan to continue every 2-week erythropoietin.  Iron studies are pending.  She has follow-up with [name] next month.    We discussed the weight gain.  I encouraged her to contact cardiology regarding her diuretic regimen.  We will see her in follow-up in 2 months.  Plan reviewed with Dr. Benay Spice.    Ned Card ANP/GNP-BC   09/17/2020  11:48 AM

## 2020-09-18 ENCOUNTER — Telehealth: Payer: Self-pay | Admitting: Oncology

## 2020-09-18 NOTE — Telephone Encounter (Signed)
Scheduled appt per 3/29 los  - called pt to confirm appts and pt is aware of upcoming appts.

## 2020-09-20 ENCOUNTER — Telehealth: Payer: Self-pay | Admitting: Gastroenterology

## 2020-09-20 NOTE — Telephone Encounter (Signed)
Okay that is fine. Thanks

## 2020-09-20 NOTE — Telephone Encounter (Signed)
[name], patient wishes to have follow up blood work with hematology since they have been having her repeat lab work as well. Thanks

## 2020-09-20 NOTE — Telephone Encounter (Signed)
Labs reviewed - faxed over to our office. Hgb stable at 9.8 over the past month, has gotten IV iron from Dr. Ammie Dalton.   Tricia Gonzales I'd like to have her repeat a HGb in 2 weeks or so to make sure stable if you can let her know and coordinate. Thanks

## 2020-10-01 ENCOUNTER — Other Ambulatory Visit: Payer: Self-pay

## 2020-10-01 ENCOUNTER — Telehealth: Payer: Self-pay | Admitting: Cardiovascular Disease

## 2020-10-01 ENCOUNTER — Inpatient Hospital Stay: Payer: Medicare Other | Attending: Oncology

## 2020-10-01 ENCOUNTER — Telehealth: Payer: Self-pay | Admitting: Oncology

## 2020-10-01 ENCOUNTER — Inpatient Hospital Stay: Payer: Medicare Other

## 2020-10-01 DIAGNOSIS — E1121 Type 2 diabetes mellitus with diabetic nephropathy: Secondary | ICD-10-CM | POA: Insufficient documentation

## 2020-10-01 DIAGNOSIS — Z79899 Other long term (current) drug therapy: Secondary | ICD-10-CM | POA: Insufficient documentation

## 2020-10-01 DIAGNOSIS — N183 Chronic kidney disease, stage 3 unspecified: Secondary | ICD-10-CM | POA: Insufficient documentation

## 2020-10-01 DIAGNOSIS — M3500 Sicca syndrome, unspecified: Secondary | ICD-10-CM | POA: Insufficient documentation

## 2020-10-01 DIAGNOSIS — M8949 Other hypertrophic osteoarthropathy, multiple sites: Secondary | ICD-10-CM | POA: Diagnosis not present

## 2020-10-01 DIAGNOSIS — I4891 Unspecified atrial fibrillation: Secondary | ICD-10-CM | POA: Insufficient documentation

## 2020-10-01 DIAGNOSIS — N189 Chronic kidney disease, unspecified: Secondary | ICD-10-CM

## 2020-10-01 DIAGNOSIS — D631 Anemia in chronic kidney disease: Secondary | ICD-10-CM | POA: Diagnosis present

## 2020-10-01 LAB — CBC WITH DIFFERENTIAL (CANCER CENTER ONLY)
Abs Immature Granulocytes: 0.03 10*3/uL (ref 0.00–0.07)
Basophils Absolute: 0 10*3/uL (ref 0.0–0.1)
Basophils Relative: 1 %
Eosinophils Absolute: 0 10*3/uL (ref 0.0–0.5)
Eosinophils Relative: 1 %
HCT: 35.3 % — ABNORMAL LOW (ref 36.0–46.0)
Hemoglobin: 10.9 g/dL — ABNORMAL LOW (ref 12.0–15.0)
Immature Granulocytes: 1 %
Lymphocytes Relative: 6 %
Lymphs Abs: 0.4 10*3/uL — ABNORMAL LOW (ref 0.7–4.0)
MCH: 30.1 pg (ref 26.0–34.0)
MCHC: 30.9 g/dL (ref 30.0–36.0)
MCV: 97.5 fL (ref 80.0–100.0)
Monocytes Absolute: 0.5 10*3/uL (ref 0.1–1.0)
Monocytes Relative: 8 %
Neutro Abs: 5.5 10*3/uL (ref 1.7–7.7)
Neutrophils Relative %: 83 %
Platelet Count: 263 10*3/uL (ref 150–400)
RBC: 3.62 MIL/uL — ABNORMAL LOW (ref 3.87–5.11)
RDW: 17.2 % — ABNORMAL HIGH (ref 11.5–15.5)
WBC Count: 6.5 10*3/uL (ref 4.0–10.5)
nRBC: 0 % (ref 0.0–0.2)

## 2020-10-01 MED ORDER — CARVEDILOL 3.125 MG PO TABS: ORAL_TABLET | ORAL | 9 refills | Status: DC

## 2020-10-01 NOTE — Progress Notes (Signed)
Per parameters Pt did not receive injection today. HGB 10.9 Pt will return in 1 week for lab and injection.

## 2020-10-01 NOTE — Telephone Encounter (Signed)
*  STAT* If patient is at the pharmacy, call can be transferred to refill team.   1. Which medications need to be refilled? (please list name of each medication and dose if known)  carvedilol (COREG) 3.125 MG tablet  2. Which pharmacy/location (including street and city if local pharmacy) is medication to be sent to? [facility], [place] - [address]  3. Do they need a 30 day or 90 day supply? 30   Patient states the way the rx is written, the medication does not last her 30 days. She needs Dr. Audie Box to write a new RX so that the patient has enough meds for a full 30 day supply

## 2020-10-01 NOTE — Telephone Encounter (Signed)
Called a spoke to patient. Inquired if she would like a ninety day supply of medications . Patient stated she would rather go every 30 days to picked medication .  RN informed patient a new e-scribed prescription with new quantity of  sent to [facility]. Patient verbalized understanding.

## 2020-10-01 NOTE — Telephone Encounter (Signed)
Called and spoke with patient regarding appointments added per 4/12 sch msg

## 2020-10-04 ENCOUNTER — Ambulatory Visit (INDEPENDENT_AMBULATORY_CARE_PROVIDER_SITE_OTHER): Payer: Medicare Other

## 2020-10-04 DIAGNOSIS — I442 Atrioventricular block, complete: Secondary | ICD-10-CM | POA: Diagnosis not present

## 2020-10-07 LAB — CUP PACEART REMOTE DEVICE CHECK
Battery Remaining Longevity: 37 mo
Battery Voltage: 2.98 V
Brady Statistic AP VP Percent: 99.72 %
Brady Statistic AP VS Percent: 0.01 %
Brady Statistic AS VP Percent: 0.27 %
Brady Statistic AS VS Percent: 0 %
Brady Statistic RA Percent Paced: 99.72 %
Brady Statistic RV Percent Paced: 99.99 %
Date Time Interrogation Session: 20220415142439
Implantable Lead Implant Date: 20161227
Implantable Lead Implant Date: 20161227
Implantable Lead Location: 753859
Implantable Lead Location: 753860
Implantable Lead Model: 5076
Implantable Lead Model: 5076
Implantable Pulse Generator Implant Date: 20161227
Lead Channel Impedance Value: 304 Ohm
Lead Channel Impedance Value: 323 Ohm
Lead Channel Impedance Value: 361 Ohm
Lead Channel Impedance Value: 399 Ohm
Lead Channel Pacing Threshold Amplitude: 0.75 V
Lead Channel Pacing Threshold Amplitude: 1.125 V
Lead Channel Pacing Threshold Pulse Width: 0.4 ms
Lead Channel Pacing Threshold Pulse Width: 0.4 ms
Lead Channel Sensing Intrinsic Amplitude: 2.5 mV
Lead Channel Sensing Intrinsic Amplitude: 2.5 mV
Lead Channel Sensing Intrinsic Amplitude: 23.875 mV
Lead Channel Sensing Intrinsic Amplitude: 23.875 mV
Lead Channel Setting Pacing Amplitude: 2 V
Lead Channel Setting Pacing Amplitude: 2.75 V
Lead Channel Setting Pacing Pulse Width: 0.4 ms
Lead Channel Setting Sensing Sensitivity: 2.8 mV

## 2020-10-08 ENCOUNTER — Inpatient Hospital Stay: Payer: Medicare Other

## 2020-10-08 ENCOUNTER — Ambulatory Visit (INDEPENDENT_AMBULATORY_CARE_PROVIDER_SITE_OTHER): Payer: Medicare Other | Admitting: Gastroenterology

## 2020-10-08 ENCOUNTER — Telehealth: Payer: Self-pay | Admitting: Gastroenterology

## 2020-10-08 ENCOUNTER — Other Ambulatory Visit: Payer: Self-pay

## 2020-10-08 ENCOUNTER — Encounter: Payer: Self-pay | Admitting: Gastroenterology

## 2020-10-08 VITALS — BP 150/80 | HR 85 | Ht 58.5 in | Wt 180.0 lb

## 2020-10-08 DIAGNOSIS — R110 Nausea: Secondary | ICD-10-CM | POA: Diagnosis not present

## 2020-10-08 DIAGNOSIS — N183 Chronic kidney disease, stage 3 unspecified: Secondary | ICD-10-CM | POA: Diagnosis not present

## 2020-10-08 DIAGNOSIS — D649 Anemia, unspecified: Secondary | ICD-10-CM

## 2020-10-08 DIAGNOSIS — K31819 Angiodysplasia of stomach and duodenum without bleeding: Secondary | ICD-10-CM

## 2020-10-08 DIAGNOSIS — D631 Anemia in chronic kidney disease: Secondary | ICD-10-CM

## 2020-10-08 DIAGNOSIS — K439 Ventral hernia without obstruction or gangrene: Secondary | ICD-10-CM

## 2020-10-08 DIAGNOSIS — R1312 Dysphagia, oropharyngeal phase: Secondary | ICD-10-CM

## 2020-10-08 DIAGNOSIS — N189 Chronic kidney disease, unspecified: Secondary | ICD-10-CM

## 2020-10-08 LAB — CBC WITH DIFFERENTIAL (CANCER CENTER ONLY)
Abs Immature Granulocytes: 0.03 10*3/uL (ref 0.00–0.07)
Basophils Absolute: 0 10*3/uL (ref 0.0–0.1)
Basophils Relative: 0 %
Eosinophils Absolute: 0 10*3/uL (ref 0.0–0.5)
Eosinophils Relative: 0 %
HCT: 34.7 % — ABNORMAL LOW (ref 36.0–46.0)
Hemoglobin: 10.5 g/dL — ABNORMAL LOW (ref 12.0–15.0)
Immature Granulocytes: 0 %
Lymphocytes Relative: 5 %
Lymphs Abs: 0.4 10*3/uL — ABNORMAL LOW (ref 0.7–4.0)
MCH: 29.6 pg (ref 26.0–34.0)
MCHC: 30.3 g/dL (ref 30.0–36.0)
MCV: 97.7 fL (ref 80.0–100.0)
Monocytes Absolute: 0.4 10*3/uL (ref 0.1–1.0)
Monocytes Relative: 6 %
Neutro Abs: 6.6 10*3/uL (ref 1.7–7.7)
Neutrophils Relative %: 89 %
Platelet Count: 245 10*3/uL (ref 150–400)
RBC: 3.55 MIL/uL — ABNORMAL LOW (ref 3.87–5.11)
RDW: 16.5 % — ABNORMAL HIGH (ref 11.5–15.5)
WBC Count: 7.5 10*3/uL (ref 4.0–10.5)
nRBC: 0 % (ref 0.0–0.2)

## 2020-10-08 MED ORDER — ONDANSETRON 4 MG PO TBDP: 4.0000 mg | ORAL_TABLET | Freq: Four times a day (QID) | ORAL | 3 refills | Status: DC | PRN

## 2020-10-08 MED ORDER — EPOETIN ALFA-EPBX 10000 UNIT/ML IJ SOLN
20000.0000 [IU] | Freq: Once | INTRAMUSCULAR | Status: AC
  Administered 2020-10-08: 20000 [IU] via SUBCUTANEOUS

## 2020-10-08 MED ORDER — EPOETIN ALFA-EPBX 40000 UNIT/ML IJ SOLN
40000.0000 [IU] | Freq: Once | INTRAMUSCULAR | Status: AC
  Administered 2020-10-08: 40000 [IU] via SUBCUTANEOUS

## 2020-10-08 NOTE — Progress Notes (Signed)
HPI :  80 y/o female here for a follow up visit for anemia.  Recall that she has a history of anemia, in part thought to be due to history of gave in the setting of anticoagulation.  She also has a history of CKD and CHF, atrial fibrillation, history of diverticular abscess status post remote surgical resection.  Patient unfortunately was admitted to the hospital in February with symptomatic anemia.  Her hemoglobin had down trended to 5's.  She has chronically dark stools in the setting of oral iron supplementation but no overt bleeding was aware of.  This occurred in the setting of Eliquis use.  She also had some dysphagia at that time.  She underwent an EGD with Dr. Bryan Lemma February 26.  She had a GE J stricture that was dilated with a balloon, she also had gave noted and was treated with APC with good result.  She has since been what sounds like on both oral and IV iron supplementation, followed by hematology.  Her hemoglobin has up trended from nines to 10.8 when last checked.  She states she is feeling better in this regard.  She states she is doing better from a cardiopulmonary standpoint, she has been on amiodarone since the hospitalization for rhythm control of her A. fib.  She states the amiodarone is making her feel nauseated, denies any other new medication changes since her symptoms starts, and feels there is a strong association with the amiodarone.  She does not vomit.  She states her dysphagia is somewhat better since the dilation but most of her symptoms are related to liquids at this point and feels it in her sternal notch.  She denies any symptoms of aspiration or coughing.  Symptoms are mild at this point time and not too bothersome.  We discussed work-up for that.  She inquires about hernia in her lower to mid abdomen at the site of her prior colon surgery scar.  States this has been bothering her for the past year.  Feels protrusion on bilateral sides of this but not much pain.  She  inquires if she should have this repaired.  Otherwise she denies complaints today.  No overt bleeding or blood loss that she endorses.  She continues to take Protonix.  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 Treated with argon plasma coagulation (APC). - Normal duodenal bulb and second portion of the duodenum.  May 2020 colonoscopy for anemia and heme positive stool.  -Diverticulosis in the entire examined colon. - End-to-end colo-colonic anastomosis, characterized by visible sutures. - One 5 mm polyp in the ascending colon, removed with a cold snare. Resected and retrieved. - Internal hemorrhoids. - The examination was otherwise normal.  Path = sessile serrated polyp  EGD 08/17/20 - Dr. Bryan Lemma  - Benign-appearing esophageal stenosis. Dilated with 20 mm TTS balloon with appropriate mucosal rent. - 2 cm hiatal hernia. - Gastric antral vascular ectasia. Treated with argon plasma coagulation (APC). - Normal gastric fundus and gastric body. - Normal examined duodenum. - No specimens collected.  Echo 08/16/20 - EF 50-55%, moderate pulm HTN   CBC Latest Ref Rng & Units 10/01/2020 09/17/2020 09/11/2020  WBC 4.0 - 10.5 K/uL 6.5 6.4 6.6  Hemoglobin 12.0 - 15.0 g/dL 10.9(L) 9.8(L) 9.8(L)  Hematocrit 36.0 - 46.0 % 35.3(L) 31.3(L) 29.6(L)  Platelets 150 - 400 K/uL 263 231 257.0  Past Medical History:  Diagnosis Date  . Anemia   . 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 Anxiety   . Arthritis   . Atrial fibrillation ([place])   . Avascular necrosis (HCC) hip left   leg pain also  . Chronic back pain   . Chronic kidney disease (CKD), stage III (moderate) (HCC)   . Depression   . Diverticulosis   . Early cataracts, bilateral   . Esophageal stricture   . Gastritis   . GERD (gastroesophageal reflux disease)   . Glaucoma   . Heart murmur     " some doctors  say that I have one some say that I don"t "  . Hiatal hernia   . History of blood transfusion    "@ least w/1st knee OR"  . History of gout   . History of kidney stones   . Hyperlipidemia   . Hypertension   . Intestinal obstruction ([place])   . Kidney stones   . Obesity   . Osteoarthritis   . Osteoporosis   . Pericarditis 2016  . Presence of permanent cardiac pacemaker   . Sjogren's disease ([place])   . Thyroid disease   . Type II diabetes mellitus ([place])   . Vitamin B12 deficiency      Past Surgical History:  Procedure Laterality Date  . ABDOMINAL HYSTERECTOMY     complete  . CARDIOVERSION N/A 07/31/2020   Procedure: CARDIOVERSION;  Surgeon: Werner Lean, MD;  Location: [facility];  Service: Cardiovascular;  Laterality: N/A;  . CHOLECYSTECTOMY OPEN    . COLECTOMY     for rectovaginal fistula  . COLONOSCOPY  01/07/2012   Procedure: COLONOSCOPY;  Surgeon: Lafayette Dragon, MD;  Location: [facility];  Service: Endoscopy;  Laterality: N/A;  . EP IMPLANTABLE DEVICE N/A 06/18/2015   Procedure: Pacemaker Implant;  Surgeon: Evans Lance, MD;  Location: [place] [facility];  Service: Cardiovascular;  Laterality: N/A;  . ESOPHAGEAL DILATION  08/17/2020   Procedure: ESOPHAGEAL DILATION;  Surgeon: Lavena Bullion, DO;  Location: [facility];  Service: Gastroenterology;;  . ESOPHAGOGASTRODUODENOSCOPY N/A 11/24/2018   Procedure: ESOPHAGOGASTRODUODENOSCOPY (EGD);  Surgeon: Yetta Flock, MD;  Location: [facility];  Service: Gastroenterology;  Laterality: N/A;  . ESOPHAGOGASTRODUODENOSCOPY (EGD) WITH ESOPHAGEAL DILATION    . ESOPHAGOGASTRODUODENOSCOPY (EGD) WITH PROPOFOL N/A 08/12/2015   Procedure: ESOPHAGOGASTRODUODENOSCOPY (EGD) WITH PROPOFOL;  Surgeon: Manus Gunning, MD;  Location: [place];  Service: Gastroenterology;  Laterality: N/A;  . ESOPHAGOGASTRODUODENOSCOPY (EGD) WITH PROPOFOL N/A 08/17/2020   Procedure: ESOPHAGOGASTRODUODENOSCOPY (EGD) WITH PROPOFOL;   Surgeon: Lavena Bullion, DO;  Location: [place];  Service: Gastroenterology;  Laterality: N/A;  . EYE SURGERY Bilateral    cataracts  . HOT HEMOSTASIS N/A 11/24/2018   Procedure: HOT HEMOSTASIS (ARGON PLASMA COAGULATION/BICAP);  Surgeon: Yetta Flock, MD;  Location: [facility];  Service: Gastroenterology;  Laterality: N/A;  . HOT HEMOSTASIS N/A 08/17/2020   Procedure: HOT HEMOSTASIS (ARGON PLASMA COAGULATION/BICAP);  Surgeon: Lavena Bullion, DO;  Location: [facility];  Service: Gastroenterology;  Laterality: N/A;  . I & D EXTREMITY Right 08/06/2016   Procedure: DEBRIDEMENT PIP RIGHT RING FINGER;  Surgeon: Daryll Brod, MD;  Location: [place];  Service: Orthopedics;  Laterality: Right;  . INSERT / REPLACE / REMOVE PACEMAKER    . JOINT REPLACEMENT    . KNEE ARTHROSCOPY Right   . MASS EXCISION Right 08/06/2016   Procedure: EXCISION CYST;  Surgeon: Daryll Brod, MD;  Location: [place];  Service:  Orthopedics;  Laterality: Right;  . REVISION TOTAL KNEE ARTHROPLASTY Left   . TOTAL KNEE ARTHROPLASTY Bilateral    Family History  Problem Relation Age of Onset  . Diabetes Mother   . Diabetes Brother   . Hypertension Sister   . Colon cancer Neg Hx   . Heart attack Neg Hx   . Stroke Neg Hx   . Colon polyps Neg Hx   . Esophageal cancer Neg Hx   . Rectal cancer Neg Hx   . Stomach cancer Neg Hx    Social History   Tobacco Use  . Smoking status: Former Smoker    Packs/day: 2.00    Years: 3.00    Pack years: 6.00    Types: Cigarettes    Quit date: 06/23/1983    Years since quitting: 37.3  . Smokeless tobacco: Never Used  Vaping Use  . Vaping Use: Never used  Substance Use Topics  . Alcohol use: Yes    Alcohol/week: 2.0 standard drinks    Types: 2 Glasses of wine per week    Comment: occ  . Drug use: No   Current Outpatient Medications  Medication Sig Dispense Refill  . ACCU-CHEK AVIVA PLUS test strip 1 each by Other route daily.     Marland Kitchen acetaminophen (TYLENOL) 650 MG CR  tablet Take 1,300 mg by mouth every evening.    Marland Kitchen amiodarone (PACERONE) 200 MG tablet Take 1 tablet (200 mg total) by mouth daily. 90 tablet 3  . BD PEN NEEDLE NANO U/F 32G X 4 MM MISC See admin instructions.    . carvedilol (COREG) 3.125 MG tablet TAKE TWO TABLETS IN THE MORNING AND TAKEONE TABLET IN THE EVENING 90 tablet 9  . Cholecalciferol (VITAMIN D3) 25 MCG (1000 UT) CAPS Take 1,000 Units by mouth daily.    . dorzolamide (TRUSOPT) 2 % ophthalmic solution Place 1 drop into the right eye 2 (two) times daily. 10 mL 0  . ELIQUIS 5 MG TABS tablet TAKE ONE TABLET TWICE DAILY 180 tablet 1  . febuxostat (ULORIC) 40 MG tablet Take 1 tablet (40 mg total) by mouth every Monday, Wednesday, and Friday. 12 tablet 0  . ferrous sulfate 325 (65 FE) MG EC tablet Take 1 tablet (325 mg total) by mouth 2 (two) times daily. 60 tablet 3  . gabapentin (NEURONTIN) 300 MG capsule Take 1 capsule (300 mg total) by mouth daily. 30 capsule 0  . gabapentin (NEURONTIN) 400 MG capsule Take 1 capsule (400 mg total) by mouth at bedtime. 30 capsule 0  . JANUVIA 25 MG tablet Take 25 mg by mouth daily.    Marland Kitchen levothyroxine (SYNTHROID) 50 MCG tablet Take 1 tablet (50 mcg total) by mouth at bedtime. 30 tablet 0  . lidocaine (LIDODERM) 5 % Place 1-3 patches onto the skin See admin instructions. Apply 1-3 patches transdermally every 24 hours as needed for pain and remove & discard patches within 12 hours or as directed by MD    . montelukast (SINGULAIR) 10 MG tablet Take 1 tablet (10 mg total) by mouth at bedtime. 30 tablet 0  . nystatin (NYAMYC) powder APPLY TOPICALLY TO LEFT ABDOMINAL FOLD AND GROIN TWICE A DAY 15 g 0  . pantoprazole (PROTONIX) 40 MG tablet Take 1 tablet (40 mg total) by mouth 2 (two) times daily before a meal for 28 days, THEN 1 tablet (40 mg total) daily. 146 tablet 0  . pentoxifylline (TRENTAL) 400 MG CR tablet Take 1 tablet (400 mg total) by mouth  daily. 30 tablet 0  . Polyethylene Glycol 3350 (MIRALAX PO) Take  17 g by mouth See admin instructions. Mix 17 grams of powder into a desired beverage and drink every morning    . Probiotic Product (PROBIOTIC DAILY PO) Take 1 capsule by mouth daily.    Marland Kitchen rOPINIRole (REQUIP) 0.5 MG tablet Take 0.5 mg by mouth at bedtime.    . rosuvastatin (CRESTOR) 5 MG tablet Take 1 tablet (5 mg total) by mouth daily. 30 tablet 0  . senna-docusate (SENOKOT-S) 8.6-50 MG tablet Take 1 tablet by mouth at bedtime.    . Torsemide 40 MG TABS Take 40 mg by mouth as needed (fluid).    . traMADol (ULTRAM) 50 MG tablet Take 50 mg by mouth See admin instructions. Take 50 mg by mouth at bedtime and an additional 50 mg once a day as needed for pain    . TRESIBA FLEXTOUCH 100 UNIT/ML FlexTouch Pen Inject 10 Units into the skin every evening.     No current facility-administered medications for this visit.   Allergies  Allergen Reactions  . Norvasc [Amlodipine Besylate] Shortness Of Breath  . Other Rash and Other (See Comments)    Pt voiced she is allergic to microfiber materials/ blankets/sheets!!  . Avapro [Irbesartan] Other (See Comments)    Headaches  . Glucophage [Metformin Hydrochloride] Other (See Comments)    "increases creatinine"  . Lisinopril     Headache   . Losartan Other (See Comments)    Headaches   . Morphine And Related Other (See Comments)    "Hallucinations"  . Simvastatin Other (See Comments)    Body aches  . Trulicity [Dulaglutide] Other (See Comments)    Severe mood swings     Review of Systems: All systems reviewed and negative except where noted in HPI.    CUP PACEART INCLINIC DEVICE CHECK  Result Date: 09/16/2020 Pacemaker check in clinic. Normal device function. Thresholds, sensing, impedances consistent with previous measurements. Device programmed to maximize longevity. No mode switch or high ventricular rates noted. Device programmed at appropriate safety margins. Histogram distribution appropriate for patient activity level. Device programmed  to optimize intrinsic conduction. Estimated longevity 3 years. Patient enrolled in remote follow-up with next transmission 10/04/20. ROV with Dr. Alecia Lemming APP in 1 year.Portia E. Rollene Rotunda, BSN, RN  CUP PACEART REMOTE DEVICE CHECK  Result Date: 10/07/2020 Scheduled remote reviewed. Normal device function.  Next remote 91 days. LH  Lab Results  Component Value Date   WBC 6.5 10/01/2020   HGB 10.9 (L) 10/01/2020   HCT 35.3 (L) 10/01/2020   MCV 97.5 10/01/2020   PLT 263 10/01/2020    Lab Results  Component Value Date   CREATININE 1.95 (H) 09/17/2020   BUN 72 (H) 09/17/2020   NA 134 (L) 09/17/2020   K 4.7 09/17/2020   CL 96 (L) 09/17/2020   CO2 27 09/17/2020   Lab Results  Component Value Date   ALT 6 09/17/2020   AST 14 (L) 09/17/2020   ALKPHOS 101 09/17/2020   BILITOT 0.4 09/17/2020    CBC Latest Ref Rng & Units 10/01/2020 09/17/2020 09/11/2020  WBC 4.0 - 10.5 K/uL 6.5 6.4 6.6  Hemoglobin 12.0 - 15.0 g/dL 10.9(L) 9.8(L) 9.8(L)  Hematocrit 36.0 - 46.0 % 35.3(L) 31.3(L) 29.6(L)  Platelets 150 - 400 K/uL 263 231 257.0     Physical Exam: BP (!) 150/80   Pulse 85   Ht 4' 10.5" (1.486 m)   Wt 180 lb (81.6 kg)  BMI 36.98 kg/m  Constitutional: Pleasant,well-developed, female in no acute distress. Abdominal: Soft, nondistended, nontender.scar in middle to lower abdomen with ventral hernia on both sides, . There are no masses palpable.  Neurological: Alert and oriented to person place and time. Psychiatric: Normal mood and affect. Behavior is normal.   ASSESSMENT AND PLAN: 80 year old female here for reassessment of the following:  GAVE Symptomatic anemia Nausea Dysphagia Ventral hernia  As above, thought to have worsening anemia from gave in the setting of anticoagulation.  She underwent EGD in February with APC ablation and had a good response.  On IV iron now and her hemoglobin has risen appropriately.  She denies any overt bleeding but stools are chronically dark due  to iron use.  She is feeling better.  She will continue to see hematology, due for follow-up CBC with them and continue to receive IV iron as needed.  We will continue to monitor with them, if she has worsening anemia despite iron supplementation then would plan for another EGD, but will hold off on that right now as she is doing better.  She does have some nausea which correlates quite well with her amiodarone that is a known side effect of that particular regimen, she should mention this to her cardiologist on the next follow-up, unclear how long she will be on amiodarone.  I will give her some Zofran to use as needed in interim to see if that will help him.  Her dysphagia as she describes it today sounds more oropharyngeal, only with liquids.  We discussed options to evaluate this, I offered her a modified barium swallow, she declines that for now given symptoms of mild and not too bothersome.  She otherwise inquires about her ventral hernias which are present on exam on both sides of her surgical incision.  These do not appear too big right now and do not cause much pain.  Given her comorbidities I would recommend avoiding surgery for this if possible.  I discussed alarm symptoms of obstruction and incarceration she will seek evaluation should that occur but hopefully risk of that is low.  Plan: - continue to follow up with hematology for monitoring of Hgb and IV iron if needed - will consider repeat EGD if her anemia recurs / worsens moving forward despite iron supplementation - Zofran for nausea, 40m ODT every 6 hours PRN - patient will discuss amiodarone with her cardiologist in regards to duration of therapy, she is convinced it is causing her nausea - offered modified barium swallow for what sounds like oropharyngeal dysphagia, she declines for now - recommend against elective surgery for her ventral hernia as it appears small and not too bothersome. If symptoms more persistent or bothersome she  will let me know. I did give her precautions about this and reviewed alarm symptoms for obstruction / incarceration, hopefully risk for that is low - continue protonix  SCarolina Cellar MD LBrand [facility]

## 2020-10-08 NOTE — Patient Instructions (Addendum)
If you are age 79 or older, your body mass index should be between 23-30. Your Body mass index is 36.98 kg/m. If this is out of the aforementioned range listed, please consider follow up with your Primary Care Provider.  If you are age 61 or younger, your body mass index should be between 19-25. Your Body mass index is 36.98 kg/m. If this is out of the aformentioned range listed, please consider follow up with your Primary Care Provider.    Please follow up with Hematology   We have sent the following medications to your pharmacy for you to pick up at your convenience: Zofran 4 mg ODT: Dissolve one tablet by mouth every 6 hours as needed for nausea  Thank you for entrusting me with your care and for choosing [employer], Dr. Burton Cellar

## 2020-10-09 MED ORDER — PROMETHAZINE HCL 12.5 MG PO TABS: 12.5000 mg | ORAL_TABLET | Freq: Three times a day (TID) | ORAL | 1 refills | Status: DC | PRN

## 2020-10-09 NOTE — Telephone Encounter (Signed)
Thanks Jan. This is unfortunate, I looked it up, there is concern that adding Zofran to amiodarone can prolong the QT interval and it is supposed to be used with caution. I would not use Zofran at this time and cancel the order in this light. We can consider phenergan 12.78m PO q 8 hrs PRN nausea however that may make the patient drowsi, not sure how well she would tolerate it. If amiodarone is thought to be the cause of her nausea she should discuss with her cardiologist the situation, not sure if they could lower the dose of it or switch to something else. Thanks

## 2020-10-09 NOTE — Telephone Encounter (Signed)
Sent script for phenergan and cancelled zofran. Called and spoke to patient.  Relayed all information and recommendations and cautions regarding drowsiness with phenergan. She expressed understanding and will discuss amiodarone with Cardiologist

## 2020-10-15 ENCOUNTER — Other Ambulatory Visit: Payer: Medicare Other

## 2020-10-15 ENCOUNTER — Ambulatory Visit: Payer: Medicare Other

## 2020-10-21 NOTE — Progress Notes (Signed)
Remote pacemaker transmission.   

## 2020-10-29 ENCOUNTER — Inpatient Hospital Stay: Payer: Medicare Other | Attending: Oncology

## 2020-10-29 ENCOUNTER — Other Ambulatory Visit: Payer: Self-pay

## 2020-10-29 ENCOUNTER — Inpatient Hospital Stay: Payer: Medicare Other

## 2020-10-29 VITALS — BP 137/67 | HR 61 | Temp 97.8°F | Resp 17

## 2020-10-29 DIAGNOSIS — E1122 Type 2 diabetes mellitus with diabetic chronic kidney disease: Secondary | ICD-10-CM | POA: Diagnosis present

## 2020-10-29 DIAGNOSIS — I4891 Unspecified atrial fibrillation: Secondary | ICD-10-CM | POA: Diagnosis not present

## 2020-10-29 DIAGNOSIS — E1121 Type 2 diabetes mellitus with diabetic nephropathy: Secondary | ICD-10-CM | POA: Diagnosis not present

## 2020-10-29 DIAGNOSIS — Z79899 Other long term (current) drug therapy: Secondary | ICD-10-CM | POA: Diagnosis not present

## 2020-10-29 DIAGNOSIS — M8949 Other hypertrophic osteoarthropathy, multiple sites: Secondary | ICD-10-CM | POA: Insufficient documentation

## 2020-10-29 DIAGNOSIS — D631 Anemia in chronic kidney disease: Secondary | ICD-10-CM | POA: Insufficient documentation

## 2020-10-29 DIAGNOSIS — N183 Chronic kidney disease, stage 3 unspecified: Secondary | ICD-10-CM | POA: Insufficient documentation

## 2020-10-29 DIAGNOSIS — M3500 Sicca syndrome, unspecified: Secondary | ICD-10-CM | POA: Insufficient documentation

## 2020-10-29 DIAGNOSIS — Z7901 Long term (current) use of anticoagulants: Secondary | ICD-10-CM | POA: Insufficient documentation

## 2020-10-29 DIAGNOSIS — N189 Chronic kidney disease, unspecified: Secondary | ICD-10-CM

## 2020-10-29 LAB — CBC WITH DIFFERENTIAL (CANCER CENTER ONLY)
Abs Immature Granulocytes: 0.02 10*3/uL (ref 0.00–0.07)
Basophils Absolute: 0 10*3/uL (ref 0.0–0.1)
Basophils Relative: 1 %
Eosinophils Absolute: 0.1 10*3/uL (ref 0.0–0.5)
Eosinophils Relative: 1 %
HCT: 35.2 % — ABNORMAL LOW (ref 36.0–46.0)
Hemoglobin: 10.7 g/dL — ABNORMAL LOW (ref 12.0–15.0)
Immature Granulocytes: 0 %
Lymphocytes Relative: 7 %
Lymphs Abs: 0.4 10*3/uL — ABNORMAL LOW (ref 0.7–4.0)
MCH: 29.9 pg (ref 26.0–34.0)
MCHC: 30.4 g/dL (ref 30.0–36.0)
MCV: 98.3 fL (ref 80.0–100.0)
Monocytes Absolute: 0.6 10*3/uL (ref 0.1–1.0)
Monocytes Relative: 9 %
Neutro Abs: 5.4 10*3/uL (ref 1.7–7.7)
Neutrophils Relative %: 82 %
Platelet Count: 208 10*3/uL (ref 150–400)
RBC: 3.58 MIL/uL — ABNORMAL LOW (ref 3.87–5.11)
RDW: 16.6 % — ABNORMAL HIGH (ref 11.5–15.5)
WBC Count: 6.5 10*3/uL (ref 4.0–10.5)
nRBC: 0 % (ref 0.0–0.2)

## 2020-10-29 MED ORDER — EPOETIN ALFA-EPBX 10000 UNIT/ML IJ SOLN
20000.0000 [IU] | Freq: Once | INTRAMUSCULAR | Status: AC
  Administered 2020-10-29: 20000 [IU] via SUBCUTANEOUS

## 2020-10-29 MED ORDER — EPOETIN ALFA-EPBX 40000 UNIT/ML IJ SOLN
40000.0000 [IU] | Freq: Once | INTRAMUSCULAR | Status: AC
  Administered 2020-10-29: 40000 [IU] via SUBCUTANEOUS

## 2020-10-29 NOTE — Patient Instructions (Signed)

## 2020-11-01 ENCOUNTER — Emergency Department (HOSPITAL_COMMUNITY): Payer: Medicare Other

## 2020-11-01 ENCOUNTER — Encounter (HOSPITAL_COMMUNITY): Payer: Self-pay

## 2020-11-01 ENCOUNTER — Emergency Department (HOSPITAL_COMMUNITY)
Admission: EM | Admit: 2020-11-01 | Discharge: 2020-11-04 | Disposition: A | Discharge status: Home / Self Care | Payer: Medicare Other | Attending: Emergency Medicine | Admitting: Emergency Medicine

## 2020-11-01 ENCOUNTER — Other Ambulatory Visit: Payer: Self-pay

## 2020-11-01 DIAGNOSIS — Z950 Presence of cardiac pacemaker: Secondary | ICD-10-CM | POA: Diagnosis not present

## 2020-11-01 DIAGNOSIS — Z96653 Presence of artificial knee joint, bilateral: Secondary | ICD-10-CM | POA: Diagnosis not present

## 2020-11-01 DIAGNOSIS — E1169 Type 2 diabetes mellitus with other specified complication: Secondary | ICD-10-CM | POA: Diagnosis not present

## 2020-11-01 DIAGNOSIS — E1136 Type 2 diabetes mellitus with diabetic cataract: Secondary | ICD-10-CM | POA: Insufficient documentation

## 2020-11-01 DIAGNOSIS — Z794 Long term (current) use of insulin: Secondary | ICD-10-CM | POA: Diagnosis not present

## 2020-11-01 DIAGNOSIS — E785 Hyperlipidemia, unspecified: Secondary | ICD-10-CM | POA: Insufficient documentation

## 2020-11-01 DIAGNOSIS — I5031 Acute diastolic (congestive) heart failure: Secondary | ICD-10-CM | POA: Insufficient documentation

## 2020-11-01 DIAGNOSIS — Z7901 Long term (current) use of anticoagulants: Secondary | ICD-10-CM | POA: Insufficient documentation

## 2020-11-01 DIAGNOSIS — E1122 Type 2 diabetes mellitus with diabetic chronic kidney disease: Secondary | ICD-10-CM | POA: Insufficient documentation

## 2020-11-01 DIAGNOSIS — D689 Coagulation defect, unspecified: Secondary | ICD-10-CM | POA: Insufficient documentation

## 2020-11-01 DIAGNOSIS — N183 Chronic kidney disease, stage 3 unspecified: Secondary | ICD-10-CM | POA: Diagnosis not present

## 2020-11-01 DIAGNOSIS — W01198A Fall on same level from slipping, tripping and stumbling with subsequent striking against other object, initial encounter: Secondary | ICD-10-CM | POA: Insufficient documentation

## 2020-11-01 DIAGNOSIS — M79662 Pain in left lower leg: Secondary | ICD-10-CM | POA: Insufficient documentation

## 2020-11-01 DIAGNOSIS — M25552 Pain in left hip: Secondary | ICD-10-CM | POA: Diagnosis not present

## 2020-11-01 DIAGNOSIS — S6991XA Unspecified injury of right wrist, hand and finger(s), initial encounter: Secondary | ICD-10-CM | POA: Diagnosis present

## 2020-11-01 DIAGNOSIS — Z87891 Personal history of nicotine dependence: Secondary | ICD-10-CM | POA: Diagnosis not present

## 2020-11-01 DIAGNOSIS — I130 Hypertensive heart and chronic kidney disease with heart failure and stage 1 through stage 4 chronic kidney disease, or unspecified chronic kidney disease: Secondary | ICD-10-CM | POA: Insufficient documentation

## 2020-11-01 DIAGNOSIS — S60221A Contusion of right hand, initial encounter: Secondary | ICD-10-CM | POA: Insufficient documentation

## 2020-11-01 DIAGNOSIS — S0990XA Unspecified injury of head, initial encounter: Secondary | ICD-10-CM | POA: Diagnosis not present

## 2020-11-01 DIAGNOSIS — E1142 Type 2 diabetes mellitus with diabetic polyneuropathy: Secondary | ICD-10-CM | POA: Diagnosis not present

## 2020-11-01 DIAGNOSIS — Z79899 Other long term (current) drug therapy: Secondary | ICD-10-CM | POA: Insufficient documentation

## 2020-11-01 DIAGNOSIS — M25562 Pain in left knee: Secondary | ICD-10-CM | POA: Diagnosis not present

## 2020-11-01 DIAGNOSIS — W19XXXA Unspecified fall, initial encounter: Secondary | ICD-10-CM

## 2020-11-01 DIAGNOSIS — Z96642 Presence of left artificial hip joint: Secondary | ICD-10-CM | POA: Diagnosis not present

## 2020-11-01 NOTE — ED Provider Notes (Signed)
Emergency Medicine Provider Triage Evaluation Note  Tricia Gonzales , a 80 y.o. female  was evaluated in triage.  Pt complains of left knee pain.  Fell 1400-1500.  Endorses mechanical fall tripping over her feet.  Patient's head landed in a strawberry bush.  Patient states that she was on the ground for 2 to 3 hours before neighbor could help her . denies any consciousness, neck pain, numbness to extremities, weakness to extremities.  Reports that she is currently on blood thinners.  Patient has had left knee and left hip replaced.    Review of Systems  Positive: Left knee pain, right lumbar back pain Negative: Numbness, weakness, facial symmetry, slurred speech, nausea, vomiting, visual disturbance, neck pain  Physical Exam  BP (!) 155/66 (BP Location: Left Arm)   Pulse 63   Temp 98 F (36.7 C) (Oral)   Resp 12   SpO2 99%  Gen:   Awake, no distress   Resp:  Normal effort  MSK:   Tenderness palpation of left knee, patient unable to perform range of motion due to complaints of pain Other:  No midline tenderness or deformity to cervical, thoracic, lumbar spine.  Head is atraumatic.  Pupils PERRL.  Medical Decision Making  Medically screening exam initiated at 7:13 PM.  Appropriate orders placed.  Alvester Chou was informed that the remainder of the evaluation will be completed by another provider, this initial triage assessment does not replace that evaluation, and the importance of remaining in the ED until their evaluation is complete.  The patient appears stable so that the remainder of the work up may be completed by another provider.      Loni Beckwith, PA-C 11/01/20 1918    Hayden Rasmussen, MD 11/02/20 1233

## 2020-11-01 NOTE — ED Triage Notes (Addendum)
Patient reports she stumbled on her patio around 1430, reports she fell into her strawberry plants, now reporting L knee pain, states she has had two knee replacements and a hip replacement on that side. Patient denies LOC, reports she is on Eliquis

## 2020-11-02 ENCOUNTER — Emergency Department (HOSPITAL_COMMUNITY): Payer: Medicare Other

## 2020-11-02 MED ORDER — GABAPENTIN 400 MG PO CAPS
400.0000 mg | ORAL_CAPSULE | Freq: Every day | ORAL | Status: DC
  Administered 2020-11-02 – 2020-11-03 (×2): 400 mg via ORAL
  Filled 2020-11-02 (×2): qty 1

## 2020-11-02 MED ORDER — LEVOTHYROXINE SODIUM 50 MCG PO TABS
50.0000 ug | ORAL_TABLET | Freq: Every day | ORAL | Status: DC
  Administered 2020-11-02 – 2020-11-03 (×2): 50 ug via ORAL
  Filled 2020-11-02 (×2): qty 1

## 2020-11-02 MED ORDER — CARVEDILOL 3.125 MG PO TABS: 3.1250 mg | ORAL_TABLET | Freq: Two times a day (BID) | ORAL | Status: DC

## 2020-11-02 MED ORDER — GABAPENTIN 300 MG PO CAPS
300.0000 mg | ORAL_CAPSULE | Freq: Every day | ORAL | Status: DC
  Administered 2020-11-02 – 2020-11-04 (×3): 300 mg via ORAL
  Filled 2020-11-02 (×3): qty 1

## 2020-11-02 MED ORDER — TRAMADOL HCL 50 MG PO TABS
50.0000 mg | ORAL_TABLET | Freq: Every day | ORAL | Status: DC | PRN
  Administered 2020-11-02: 50 mg via ORAL
  Filled 2020-11-02: qty 1

## 2020-11-02 MED ORDER — APIXABAN 5 MG PO TABS
5.0000 mg | ORAL_TABLET | Freq: Two times a day (BID) | ORAL | Status: DC
  Administered 2020-11-02 – 2020-11-03 (×4): 5 mg via ORAL
  Filled 2020-11-02 (×4): qty 1

## 2020-11-02 MED ORDER — CARVEDILOL 3.125 MG PO TABS
3.1250 mg | ORAL_TABLET | Freq: Every day | ORAL | Status: DC
  Administered 2020-11-02 – 2020-11-03 (×2): 3.125 mg via ORAL
  Filled 2020-11-02 (×2): qty 1

## 2020-11-02 MED ORDER — OXYCODONE-ACETAMINOPHEN 5-325 MG PO TABS
1.0000 | ORAL_TABLET | Freq: Once | ORAL | Status: AC
  Administered 2020-11-02: 1 via ORAL
  Filled 2020-11-02: qty 1

## 2020-11-02 MED ORDER — OXYCODONE-ACETAMINOPHEN 5-325 MG PO TABS
1.0000 | ORAL_TABLET | ORAL | Status: DC | PRN
  Administered 2020-11-02 – 2020-11-04 (×3): 1 via ORAL
  Filled 2020-11-02 (×3): qty 1

## 2020-11-02 MED ORDER — AMIODARONE HCL 200 MG PO TABS
200.0000 mg | ORAL_TABLET | Freq: Every day | ORAL | Status: DC
  Administered 2020-11-02 – 2020-11-04 (×3): 200 mg via ORAL
  Filled 2020-11-02 (×3): qty 1

## 2020-11-02 MED ORDER — ROPINIROLE HCL 0.5 MG PO TABS
0.5000 mg | ORAL_TABLET | Freq: Every day | ORAL | Status: DC
  Administered 2020-11-02 – 2020-11-03 (×2): 0.5 mg via ORAL
  Filled 2020-11-02 (×2): qty 1

## 2020-11-02 MED ORDER — MONTELUKAST SODIUM 10 MG PO TABS
10.0000 mg | ORAL_TABLET | Freq: Every day | ORAL | Status: DC
  Administered 2020-11-02 – 2020-11-03 (×2): 10 mg via ORAL
  Filled 2020-11-02 (×3): qty 1

## 2020-11-02 MED ORDER — TRAMADOL HCL 50 MG PO TABS
50.0000 mg | ORAL_TABLET | Freq: Every day | ORAL | Status: DC
Start: 2020-11-02 — End: 2020-11-04
  Administered 2020-11-02 – 2020-11-03 (×2): 50 mg via ORAL
  Filled 2020-11-02 (×2): qty 1

## 2020-11-02 MED ORDER — LINAGLIPTIN 5 MG PO TABS
5.0000 mg | ORAL_TABLET | Freq: Every day | ORAL | Status: DC
  Administered 2020-11-02 – 2020-11-04 (×3): 5 mg via ORAL
  Filled 2020-11-02 (×3): qty 1

## 2020-11-02 MED ORDER — OXYCODONE-ACETAMINOPHEN 5-325 MG PO TABS
1.0000 | ORAL_TABLET | Freq: Once | ORAL | Status: AC
Start: 2020-11-02 — End: 2020-11-02
  Administered 2020-11-02: 1 via ORAL
  Filled 2020-11-02: qty 1

## 2020-11-02 MED ORDER — ROSUVASTATIN CALCIUM 5 MG PO TABS
5.0000 mg | ORAL_TABLET | Freq: Every day | ORAL | Status: DC
  Administered 2020-11-02 – 2020-11-03 (×2): 5 mg via ORAL
  Filled 2020-11-02 (×2): qty 1

## 2020-11-02 MED ORDER — PENTOXIFYLLINE ER 400 MG PO TBCR
400.0000 mg | EXTENDED_RELEASE_TABLET | Freq: Every day | ORAL | Status: DC
  Administered 2020-11-02 – 2020-11-03 (×2): 400 mg via ORAL
  Filled 2020-11-02 (×3): qty 1

## 2020-11-02 MED ORDER — BRIMONIDINE TARTRATE 0.2 % OP SOLN
1.0000 [drp] | Freq: Two times a day (BID) | OPHTHALMIC | Status: DC
  Administered 2020-11-02 – 2020-11-03 (×3): 1 [drp] via OPHTHALMIC
  Filled 2020-11-02: qty 5

## 2020-11-02 MED ORDER — CARVEDILOL 12.5 MG PO TABS
6.2500 mg | ORAL_TABLET | Freq: Every day | ORAL | Status: DC
  Administered 2020-11-02 – 2020-11-04 (×3): 6.25 mg via ORAL
  Filled 2020-11-02: qty 1
  Filled 2020-11-02: qty 2
  Filled 2020-11-02: qty 1

## 2020-11-02 MED ORDER — TRAMADOL HCL 50 MG PO TABS: 50.0000 mg | ORAL_TABLET | ORAL | Status: DC

## 2020-11-02 MED ORDER — PANTOPRAZOLE SODIUM 40 MG PO TBEC
40.0000 mg | DELAYED_RELEASE_TABLET | Freq: Every day | ORAL | Status: DC
  Administered 2020-11-02 – 2020-11-04 (×3): 40 mg via ORAL
  Filled 2020-11-02 (×3): qty 1

## 2020-11-02 NOTE — NC FL2 (Signed)
[place] LEVEL OF CARE SCREENING TOOL     IDENTIFICATION  Patient Name: Tricia Gonzales Birthdate: 09/24/1940 Sex: female Admission Date (Current Location): 11/01/2020  [facility] [place] Number:  Herbalist and Address:  [facility] [place], [place] [address], [place], [place] [address]      Provider Number: [id number]  Attending Physician Name and Address:  Lucrezia Starch, MD  Relative Name and Phone Number:  Gabbi Whetstone, [phone]    Current Level of Care: Other (Comment) (Emergency department) Recommended Level of Care: [place] Prior Approval Number:    Date Approved/Denied:   PASRR Number: [id number]  Discharge Plan: SNF    Current Diagnoses: Patient Active Problem List   Diagnosis Date Noted  . Benign esophageal stricture   . Acute blood loss anemia   . Symptomatic anemia   . Acute diastolic heart failure ([place]) 08/15/2020  . AKI (acute kidney injury) ([place]) 08/15/2020  . CHF (congestive heart failure) ([place]) 08/15/2020  . Atrial flutter ([place])   . Sinus node dysfunction ([place]) 09/06/2019  . Status post left hip replacement 06/28/2019  . Urinary retention 05/12/2019  . Polyneuropathy associated with underlying disease ([place]) 05/12/2019  . Immobility 04/26/2019  . Low ferritin 04/24/2019  . Primary osteoarthritis of left hip 03/29/2019  . GAVE (gastric antral vascular ectasia)   . Avascular necrosis of bone of hip, left ([place]) 08/22/2018  . Vitamin B12 deficiency   . Type II diabetes mellitus ([place])   . Sjogren's disease ([place])   . Presence of permanent cardiac pacemaker   . Pericarditis   . Osteoarthritis   . Obesity   . Kidney stones   . Intestinal obstruction ([place])   . History of gout   . Hiatal hernia   . History of blood transfusion   . Heart murmur   . GERD (gastroesophageal reflux disease)   . Gastritis   . Esophageal stricture   . Early cataracts, bilateral   . Diverticulosis    . Depression   . Chronic kidney disease (CKD), stage III (moderate) (HCC)   . Chronic back pain   . Atrial fibrillation ([place])   . Arthritis   . Anxiety   . Anemia   . Osteoarthritis of finger of right hand 01/22/2016  . Pain 01/22/2016  . Primary osteoarthritis of both first carpometacarpal joints 01/22/2016  . 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 Chest pain at rest   . Heme positive stool   . UTI (urinary tract infection) 08/09/2015  . Acute pericarditis   . Chest pain 08/06/2015  . Diabetes mellitus type 2, controlled ([place]) 08/06/2015  . Hyperlipidemia 08/06/2015  . Hypertension 08/06/2015  . CKD (chronic kidney disease) stage 3, GFR 30-59 ml/min (HCC) 08/06/2015  . Chronic anemia 08/06/2015  . Hyperkalemia   . Pain in the chest   . Pacemaker   . 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 Osteoporosis 03/22/2015  . Abdominal pain, left lower quadrant 01/07/2012  . Syncope 09/15/2011  . Junctional escape rhythm 09/15/2011  . PELVIC MASS 03/27/2009  . ABSCESS OF INTESTINE 02/19/2009  . VITAMIN B12 DEFICIENCY 11/21/2007  . OBESITY 11/21/2007  . ESOPHAGEAL STRICTURE 11/21/2007  . HIATAL HERNIA 11/21/2007  . UNSPECIFIED INTESTINAL OBSTRUCTION 11/21/2007  . DIVERTICULOSIS OF COLON 11/21/2007  . OSTEOARTHRITIS 11/21/2007  . DIABETES MELLITUS, HX OF 11/21/2007  . ANEMIA, HX OF 11/21/2007  . HYPERTENSION, HX OF 11/21/2007  . REFLUX ESOPHAGITIS, HX OF 11/21/2007  Orientation RESPIRATION BLADDER Height & Weight     Self,Time,Place,Situation  Normal Continent Weight: 179 lb 14.3 oz (81.6 kg) Height:  4' 10.5" (148.6 cm)  BEHAVIORAL SYMPTOMS/MOOD NEUROLOGICAL BOWEL NUTRITION STATUS      Continent    AMBULATORY STATUS COMMUNICATION OF NEEDS Skin   Extensive Assist Verbally Normal                       Personal Care Assistance Level of Assistance  Bathing,Feeding,Dressing Bathing Assistance: Limited assistance Feeding assistance: Independent Dressing  Assistance: Limited assistance     Functional Limitations Info             SPECIAL CARE FACTORS FREQUENCY  PT (By licensed PT),OT (By licensed OT)     PT Frequency: 5x weekly OT Frequency: 5x weekly            Contractures Contractures Info: Not present    Additional Factors Info  Code Status,Allergies Code Status Info: full Allergies Info: norvasc, avapro, metformin, lisinopril, losartan, morphine, simvastanin, smlicity           Current Medications (11/02/2020):  This is the current hospital active medication list Current Facility-Administered Medications  Medication Dose Route Frequency Provider Last Rate Last Admin  . amiodarone (PACERONE) tablet 200 mg  200 mg Oral Daily Harris, Abigail, PA-C   200 mg at 11/02/20 1009  . apixaban (ELIQUIS) tablet 5 mg  5 mg Oral BID Margarita Mail, PA-C   5 mg at 11/02/20 1009  . brimonidine (ALPHAGAN) 0.2 % ophthalmic solution 1 drop  1 drop Right Eye BID Harris, Abigail, PA-C      . carvedilol (COREG) tablet 6.25 mg  6.25 mg Oral Q breakfast Harris, Abigail, PA-C   6.25 mg at 11/02/20 1009   And  . carvedilol (COREG) tablet 3.125 mg  3.125 mg Oral Q supper Harris, Abigail, PA-C      . gabapentin (NEURONTIN) capsule 300 mg  300 mg Oral Daily Harris, Abigail, PA-C   300 mg at 11/02/20 1009  . gabapentin (NEURONTIN) capsule 400 mg  400 mg Oral QHS Harris, Abigail, PA-C      . levothyroxine (SYNTHROID) tablet 50 mcg  50 mcg Oral QHS Harris, Abigail, PA-C      . linagliptin (TRADJENTA) tablet 5 mg  5 mg Oral Daily Harris, Abigail, PA-C      . montelukast (SINGULAIR) tablet 10 mg  10 mg Oral QHS Harris, Abigail, PA-C      . pantoprazole (PROTONIX) EC tablet 40 mg  40 mg Oral Daily Harris, Abigail, PA-C   40 mg at 11/02/20 1010  . pentoxifylline (TRENTAL) CR tablet 400 mg  400 mg Oral Daily Harris, Abigail, PA-C      . rOPINIRole (REQUIP) tablet 0.5 mg  0.5 mg Oral QHS Harris, Abigail, PA-C      . rosuvastatin (CRESTOR) tablet 5 mg  5  mg Oral QHS Harris, Abigail, PA-C      . traMADol (ULTRAM) tablet 50 mg  50 mg Oral QHS Harris, Abigail, PA-C       And  . traMADol (ULTRAM) tablet 50 mg  50 mg Oral Daily PRN Margarita Mail, PA-C   50 mg at 11/02/20 1010   Current Outpatient Medications  Medication Sig Dispense Refill  . ACCU-CHEK AVIVA PLUS test strip 1 each by Other route daily.     Marland Kitchen acetaminophen (TYLENOL) 650 MG CR tablet Take 1,300 mg by mouth every evening.    Marland Kitchen amiodarone (PACERONE) 200  MG tablet Take 1 tablet (200 mg total) by mouth daily. 90 tablet 3  . BD PEN NEEDLE NANO U/F 32G X 4 MM MISC See admin instructions.    . brimonidine (ALPHAGAN) 0.2 % ophthalmic solution Place 1 drop into the right eye 2 (two) times daily.    . carvedilol (COREG) 3.125 MG tablet TAKE TWO TABLETS IN THE MORNING AND TAKEONE TABLET IN THE EVENING (Patient taking differently: Take 3.125-6.25 mg by mouth 2 (two) times daily with a meal. 6.25 mg in the morning and 3.125 mg in the evening.) 90 tablet 9  . Cholecalciferol (VITAMIN D3) 25 MCG (1000 UT) CAPS Take 1,000 Units by mouth daily.    . cycloSPORINE (RESTASIS) 0.05 % ophthalmic emulsion Place 1 drop into both eyes 2 (two) times daily.    Marland Kitchen ELIQUIS 5 MG TABS tablet TAKE ONE TABLET TWICE DAILY (Patient taking differently: Take 5 mg by mouth 2 (two) times daily.) 180 tablet 1  . febuxostat (ULORIC) 40 MG tablet Take 1 tablet (40 mg total) by mouth every Monday, Wednesday, and Friday. 12 tablet 0  . ferrous sulfate 325 (65 FE) MG EC tablet Take 1 tablet (325 mg total) by mouth 2 (two) times daily. (Patient taking differently: Take 325 mg by mouth daily with breakfast.) 60 tablet 3  . gabapentin (NEURONTIN) 300 MG capsule Take 1 capsule (300 mg total) by mouth daily. 30 capsule 0  . gabapentin (NEURONTIN) 400 MG capsule Take 1 capsule (400 mg total) by mouth at bedtime. 30 capsule 0  . JANUVIA 25 MG tablet Take 25 mg by mouth daily.    Marland Kitchen levothyroxine (SYNTHROID) 50 MCG tablet Take 1 tablet  (50 mcg total) by mouth at bedtime. 30 tablet 0  . lidocaine (LIDODERM) 5 % Place 1-3 patches onto the skin See admin instructions. Apply 1-3 patches transdermally every 24 hours as needed for pain and remove & discard patches within 12 hours or as directed by MD    . montelukast (SINGULAIR) 10 MG tablet Take 1 tablet (10 mg total) by mouth at bedtime. 30 tablet 0  . nystatin (NYAMYC) powder APPLY TOPICALLY TO LEFT ABDOMINAL FOLD AND GROIN TWICE A DAY (Patient taking differently: Apply 1 application topically See admin instructions. Abdominal fold and groin bid prn rash) 15 g 0  . pantoprazole (PROTONIX) 40 MG tablet Take 40 mg by mouth daily.    . pentoxifylline (TRENTAL) 400 MG CR tablet Take 1 tablet (400 mg total) by mouth daily. 30 tablet 0  . Polyethylene Glycol 3350 (MIRALAX PO) Take 17 g by mouth daily.    . promethazine (PHENERGAN) 12.5 MG tablet Take 1 tablet (12.5 mg total) by mouth every 8 (eight) hours as needed for nausea or vomiting. 60 tablet 1  . rOPINIRole (REQUIP) 0.5 MG tablet Take 0.5 mg by mouth at bedtime.    . rosuvastatin (CRESTOR) 5 MG tablet Take 1 tablet (5 mg total) by mouth daily. (Patient taking differently: Take 5 mg by mouth at bedtime.) 30 tablet 0  . torsemide (DEMADEX) 20 MG tablet Take 20 mg by mouth daily.    . Torsemide 40 MG TABS Take 40 mg by mouth as needed (fluid).    . traMADol (ULTRAM) 50 MG tablet Take 50 mg by mouth See admin instructions. Take 50 mg by mouth at bedtime and an additional 50 mg once a day as needed for pain    . TRESIBA FLEXTOUCH 100 UNIT/ML FlexTouch Pen Inject 10 Units into the skin every  evening.    . dorzolamide (TRUSOPT) 2 % ophthalmic solution Place 1 drop into the right eye 2 (two) times daily. (Patient not taking: Reported on 11/02/2020) 10 mL 0  . ondansetron (ZOFRAN ODT) 4 MG disintegrating tablet Take 1 tablet (4 mg total) by mouth every 6 (six) hours as needed for nausea or vomiting. (Patient not taking: No sig reported) 30  tablet 3  . pantoprazole (PROTONIX) 40 MG tablet Take 1 tablet (40 mg total) by mouth 2 (two) times daily before a meal for 28 days, THEN 1 tablet (40 mg total) daily. (Patient not taking: Reported on 11/02/2020) 146 tablet 0     Discharge Medications: Please see discharge summary for a list of discharge medications.  Relevant Imaging Results:  Relevant Lab Results:   Additional Information SSN: [id number]  Oretha Milch, LCSW

## 2020-11-02 NOTE — ED Notes (Signed)
Pat to [employer]

## 2020-11-02 NOTE — Evaluation (Signed)
Physical Therapy Evaluation Patient Details Name: JEVON LITTLEPAGE MRN: [id number] DOB: 11-26-1940 Today's Date: 11/02/2020   History of Present Illness  80 y.o. female admitted on 11/01/20 to the ED for fall with L knee, hip pain and R hand pain.  Per x-rays she did not sustain any acute fractures, however, is very painful.  Pt with significant PMH of DM2, cardiac pacemaker, HTN, gout, obesity, HTN, glaucoma, diverticulitis, CKD III, AVN L hip s/p THA, A-fib, bil TKA and L knee revision, colectomy.  Clinical Impression  Pt was able to stand EOB and preform some minimal stepping in place with the support of RW and PT, however, unable to step away from the bed.  She reports her son is checking to see if a previous caregiver is available to stay with his mom, but otherwise, if that does not pan out she does not have support at home and will need SNF placement for rehab to get her back on her feet to be able to safely return home. I have asked for some type of non-immobilizing, compressive knee support for increased confidence, compression, and pain management while moving, either hinged or just compressive knee sleeve (whichever one fits her leg shape better.   PT to follow acutely for deficits listed below.  I also ordered OT to start addressing self care while we await d/c dispo.      Follow Up Recommendations SNF    Equipment Recommendations  None recommended by PT    Recommendations for Other Services OT consult     Precautions / Restrictions Precautions Precautions: Fall      Mobility  Bed Mobility Overal bed mobility: Needs Assistance Bed Mobility: Supine to Sit;Sit to Supine     Supine to sit: Min assist;HOB elevated Sit to supine: Mod assist;HOB elevated   General bed mobility comments: Min assist to help progress left leg primarily over EOB.  Mod assist to help lift both legs back into the bed.    Transfers Overall transfer level: Needs assistance Equipment used: 4-wheeled  walker Transfers: Sit to/from Stand Sit to Stand: Min assist         General transfer comment: Min assist to support patients in standing.  Ambulation/Gait             General Gait Details: unable to step away from the support of the chair but was able to step in place without L knee buckling.  She feels that if she had some additional compression it would feel better, so spoke to PA re: some kind of knee support hinged knee sleeve or compression sleeve without hinges.  Stairs            Wheelchair Mobility    Modified Rankin (Stroke Patients Only)       Balance Overall balance assessment: Needs assistance Sitting-balance support: Feet supported;No upper extremity supported Sitting balance-Leahy Scale: Fair     Standing balance support: Bilateral upper extremity supported Standing balance-Leahy Scale: Poor Standing balance comment: needs support from therapist and RW                             Pertinent Vitals/Pain Pain Assessment: Faces Faces Pain Scale: Hurts whole lot Pain Location: left knee and leg Pain Descriptors / Indicators: Grimacing;Guarding Pain Intervention(s): Limited activity within patient's tolerance;Monitored during session;Repositioned;Ice applied;Patient requesting pain meds-RN notified    Home Living Family/patient expects to be discharged to:: Private residence Living Arrangements: Alone Available Help  at Discharge: Family;Available PRN/intermittently (mentions a son, but states that no one can come and stay with her.) Type of Home: House Home Access: Stairs to enter Entrance Stairs-Rails: Right;Left Entrance Stairs-Number of Steps: 5 Home Layout: One level Home Equipment: Cane - single point;Shower seat;Wheelchair - Visual merchandiser - 2 wheels;Walker - 4 wheels Additional Comments: likes to plant flowers, tend to her strawberries, eat out with her sisters.    Prior Function Level of Independence:  Independent with assistive device(s)         Comments: Pt reports she still drives, uses a cane for community access, uses a shower chair when she washes her hair (because I cannot stand that long)     Hand Dominance        Extremity/Trunk Assessment   Upper Extremity Assessment Upper Extremity Assessment: RUE deficits/detail RUE Deficits / Details: right hand with visible bruising from fall    Lower Extremity Assessment Lower Extremity Assessment: RLE deficits/detail;LLE deficits/detail RLE Deficits / Details: right knee with bruise, however, strength on this side WNL RLE Sensation: history of peripheral neuropathy LLE Deficits / Details: ankle 3-/5, knee 3-/5 hip 2+/5 per supine and seated assements as well as gross functional assessments. LLE Sensation: history of peripheral neuropathy    Cervical / Trunk Assessment Cervical / Trunk Assessment: Normal  Communication   Communication: No difficulties  Cognition Arousal/Alertness: Awake/alert Behavior During Therapy: WFL for tasks assessed/performed Overall Cognitive Status: Within Functional Limits for tasks assessed                                        General Comments General comments (skin integrity, edema, etc.): Pt reports son is looking into seeing if they can re-hire her old caregiver from when she had abdominal surgery, but does not think she does that kind of work anymore.  If she cannot have someone with her at home she will need to go to SNF for rehab.    Exercises     Assessment/Plan    PT Assessment Patient needs continued PT services  PT Problem List Decreased strength;Decreased activity tolerance;Decreased balance;Decreased range of motion;Decreased mobility;Decreased knowledge of use of DME;Obesity;Pain       PT Treatment Interventions DME instruction;Gait training;Stair training;Therapeutic activities;Functional mobility training;Therapeutic exercise;Balance  training;Patient/family education;Manual techniques;Modalities    PT Goals (Current goals can be found in the Care Plan section)  Acute Rehab PT Goals Patient Stated Goal: pt would prefer to go home over going to rehab at SNF PT Goal Formulation: With patient Time For Goal Achievement: 11/16/20 Potential to Achieve Goals: Good    Frequency Min 3X/week (may need more for d/c progression)   Barriers to discharge Inaccessible home environment;Decreased caregiver support      Co-evaluation               AM-PAC PT "6 Clicks" Mobility  Outcome Measure Help needed turning from your back to your side while in a flat bed without using bedrails?: A Little Help needed moving from lying on your back to sitting on the side of a flat bed without using bedrails?: A Little Help needed moving to and from a bed to a chair (including a wheelchair)?: A Little Help needed standing up from a chair using your arms (e.g., wheelchair or bedside chair)?: A Little Help needed to walk in hospital room?: A Lot Help needed climbing 3-5 steps with a railing? : Total  6 Click Score: 15    End of Session   Activity Tolerance: Patient limited by pain Patient left: in bed;with call bell/phone within reach Nurse Communication: Mobility status PT Visit Diagnosis: Muscle weakness (generalized) (M62.81);Difficulty in walking, not elsewhere classified (R26.2);Pain Pain - Right/Left: Left Pain - part of body: Knee    Time: 1224-8250 PT Time Calculation (min) (ACUTE ONLY): 28 min   Charges:   PT Evaluation $PT Eval Moderate Complexity: 1 Mod PT Treatments $Therapeutic Activity: 8-22 mins        Verdene Lennert, PT, DPT  Acute Rehabilitation [phone] pager [phone]) [phone] office

## 2020-11-02 NOTE — Progress Notes (Signed)
Orthopedic Tech Progress Note Patient Details:  Tricia Gonzales 23-Jan-1941 [id number]  Ortho Devices Type of Ortho Device: Knee Sleeve Ortho Device/Splint Location: LLE Ortho Device/Splint Interventions: Application,Ordered   Post Interventions Patient Tolerated: Well   [name] 11/02/2020, 11:12 AM

## 2020-11-02 NOTE — ED Notes (Signed)
Report received by previous Training and development officer.

## 2020-11-02 NOTE — Progress Notes (Signed)
Physical Therapy Treatment Patient Details Name: Tricia Gonzales MRN: [id number] DOB: Apr 15, 1941 Today's Date: 11/02/2020    History of Present Illness 80 y.o. female admitted on 11/01/20 to the ED for fall with L knee, hip pain and R hand pain.  Per x-rays she did not sustain any acute fractures, however, is very painful.  Pt with significant PMH of DM2, cardiac pacemaker, HTN, gout, obesity, HTN, glaucoma, diverticulitis, CKD III, AVN L hip s/p THA, A-fib, bil TKA and L knee revision, colectomy.    PT Comments    Returned this PM to re-attempt gait after pt was fitted with knee sleeve.  She reports the leg feels better with the support of the sleeve when standing and now short distance gait.  She remains min assist overall and has some buckling moments in her L knee during gait, but compensates with use of the walker.  She remains aware that she may need more support than she can get returning home.  PT will continue to follow acutely for safe mobility progression.  Ankle pumps and quad sets encouraged hourly.     Follow Up Recommendations  SNF     Equipment Recommendations  None recommended by PT    Recommendations for Other Services OT consult     Precautions / Restrictions Precautions Precautions: Fall Precaution Comments: h/o falls, fell on this admission    Mobility  Bed Mobility Overal bed mobility: Needs Assistance Bed Mobility: Supine to Sit;Sit to Supine     Supine to sit: Min assist;HOB elevated Sit to supine: Mod assist;HOB elevated   General bed mobility comments: Min assist mostly to help progress left leg to EOB, mod assist to return to bed assisting with both legs.  Pt confirms she sleeps in a bed at home.    Transfers Overall transfer level: Needs assistance Equipment used: 4-wheeled walker Transfers: Sit to/from Stand Sit to Stand: Min assist;From elevated surface         General transfer comment: Min assist to help power up to stand and  stabilize  Ambulation/Gait Ambulation/Gait assistance: Min assist Gait Distance (Feet): 40 Feet Assistive device: 4-wheeled walker Gait Pattern/deviations: Step-through pattern;Antalgic     General Gait Details: moderately antalgic gait pattern, heavy reliance on arms for support and left knee giving way every few feet (however, pt able to stabilize on RW and compensate).   Stairs             Wheelchair Mobility    Modified Rankin (Stroke Patients Only)       Balance Overall balance assessment: Needs assistance Sitting-balance support: Feet supported;No upper extremity supported Sitting balance-Leahy Scale: Good     Standing balance support: Bilateral upper extremity supported Standing balance-Leahy Scale: Poor Standing balance comment: needs support from therapist and RW                            Cognition Arousal/Alertness: Awake/alert Behavior During Therapy: WFL for tasks assessed/performed Overall Cognitive Status: Within Functional Limits for tasks assessed                                        Exercises General Exercises - Lower Extremity Ankle Circles/Pumps: AROM;Both;20 reps (encouraged hourly) Quad Sets: AROM;Both;10 reps (encouraged hourly)    General Comments        Pertinent Vitals/Pain Pain Assessment: Faces Faces Pain Scale: Hurts  whole lot Pain Location: left knee and leg Pain Descriptors / Indicators: Grimacing;Guarding Pain Intervention(s): Limited activity within patient's tolerance;Monitored during session;Repositioned    Home Living                      Prior Function            PT Goals (current goals can now be found in the care plan section) Acute Rehab PT Goals Patient Stated Goal: pt would prefer to go home over going to rehab at SNF Progress towards PT goals: Progressing toward goals    Frequency    Min 3X/week      PT Plan Current plan remains appropriate     Co-evaluation              AM-PAC PT "6 Clicks" Mobility   Outcome Measure  Help needed turning from your back to your side while in a flat bed without using bedrails?: A Little Help needed moving from lying on your back to sitting on the side of a flat bed without using bedrails?: A Little Help needed moving to and from a bed to a chair (including a wheelchair)?: A Little Help needed standing up from a chair using your arms (e.g., wheelchair or bedside chair)?: A Little Help needed to walk in hospital room?: A Little Help needed climbing 3-5 steps with a railing? : Total 6 Click Score: 16    End of Session   Activity Tolerance: Patient limited by pain Patient left: in bed;with call bell/phone within reach Nurse Communication: Mobility status;Other (comment) (pt can walk to bathroom with RW (and brought RW) and assist) PT Visit Diagnosis: Muscle weakness (generalized) (M62.81);Difficulty in walking, not elsewhere classified (R26.2);Pain Pain - Right/Left: Left Pain - part of body: Knee     Time: 7408-1448 PT Time Calculation (min) (ACUTE ONLY): 25 min  Charges:  $Gait Training: 8-22 mins $Therapeutic Activity: 8-22 mins                     Verdene Lennert, PT, DPT  Acute Rehabilitation [phone] pager [phone]) [phone] office

## 2020-11-02 NOTE — ED Notes (Signed)
Patient assisted to and from restroom at this time. Difficulty placing weight onto left knee to ambulate. 2x assistance needed transferring.

## 2020-11-02 NOTE — TOC Initial Note (Signed)
Transition of Care [facility]) - Initial/Assessment Note    Patient Details  Name: Tricia Gonzales MRN: [id number] Date of Birth: April 21, 1941  Transition of [facility]) CM/SW Contact:    Oretha Milch, LCSW Phone Number: 11/02/2020, 11:52 AM  Clinical Narrative:                 CSW met with patient to discuss recommendation for SNF by PT. CSW noted patient expressed hesitation for SNF and reported she had several bad prior experiences with them. Patient verbalized understanding that she would struggle being back in her apartment as she has five steps and have had to call for help in the past. Patient verbalized consent for SNF referrals except for [facility], [facility]. [facility] or [facility]. CSW discussed plan with patient and noted she would be open to extending search further outside of [facility] if needed.   Expected Discharge Plan: Skilled Nursing Facility Barriers to Discharge: SNF Pending bed offer   Patient Goals and CMS Choice Patient states their goals for this hospitalization and ongoing recovery are:: "I don't really want to go, but I know it would help my leg." CMS Medicare.gov Compare Post Acute Care list provided to:: Patient Choice offered to / list presented to : Patient  Expected Discharge Plan and Services Expected Discharge Plan: [place] Choice: [place]                                        Prior Living Arrangements/Services   Lives with:: Self Patient language and need for interpreter reviewed:: Yes Do you feel safe going back to the place where you live?: Yes      Need for Family Participation in Patient Care: No (Comment) Care giver support system in place?: No (comment) Current home services: DME Criminal Activity/Legal Involvement Pertinent to Current Situation/Hospitalization: No - Comment as needed  Activities of Daily Living      Permission Sought/Granted Permission sought to  share information with : Chartered certified accountant granted to share information with : Yes, Verbal Permission Granted              Emotional Assessment Appearance:: Appears stated age Attitude/Demeanor/Rapport: Charismatic Affect (typically observed): Calm Orientation: : Oriented to Self,Oriented to Place,Oriented to  Time,Oriented to Situation Alcohol / Substance Use: Not Applicable Psych Involvement: No (comment)  Admission diagnosis:  Fall; L Knee Pain Patient Active Problem List   Diagnosis Date Noted  . Benign esophageal stricture   . Acute blood loss anemia   . Symptomatic anemia   . Acute diastolic heart failure ([place]) 08/15/2020  . AKI (acute kidney injury) ([place]) 08/15/2020  . CHF (congestive heart failure) ([place]) 08/15/2020  . Atrial flutter ([place])   . Sinus node dysfunction ([place]) 09/06/2019  . Status post left hip replacement 06/28/2019  . Urinary retention 05/12/2019  . Polyneuropathy associated with underlying disease ([place]) 05/12/2019  . Immobility 04/26/2019  . Low ferritin 04/24/2019  . Primary osteoarthritis of left hip 03/29/2019  . GAVE (gastric antral vascular ectasia)   . Avascular necrosis of bone of hip, left ([place]) 08/22/2018  . Vitamin B12 deficiency   . Type II diabetes mellitus ([place])   . Sjogren's disease ([place])   . Presence of permanent cardiac pacemaker   . Pericarditis   . Osteoarthritis   . Obesity   . Kidney  stones   . Intestinal obstruction ([place])   . History of gout   . Hiatal hernia   . History of blood transfusion   . Heart murmur   . GERD (gastroesophageal reflux disease)   . Gastritis   . Esophageal stricture   . Early cataracts, bilateral   . Diverticulosis   . Depression   . Chronic kidney disease (CKD), stage III (moderate) (HCC)   . Chronic back pain   . Atrial fibrillation ([place])   . Arthritis   . Anxiety   . Anemia   . Osteoarthritis of finger of right hand 01/22/2016  . Pain 01/22/2016  . Primary  osteoarthritis of both first carpometacarpal joints 01/22/2016  . 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 Chest pain at rest   . Heme positive stool   . UTI (urinary tract infection) 08/09/2015  . Acute pericarditis   . Chest pain 08/06/2015  . Diabetes mellitus type 2, controlled ([place]) 08/06/2015  . Hyperlipidemia 08/06/2015  . Hypertension 08/06/2015  . CKD (chronic kidney disease) stage 3, GFR 30-59 ml/min (HCC) 08/06/2015  . Chronic anemia 08/06/2015  . Hyperkalemia   . Pain in the chest   . Pacemaker   . 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 Osteoporosis 03/22/2015  . Abdominal pain, left lower quadrant 01/07/2012  . Syncope 09/15/2011  . Junctional escape rhythm 09/15/2011  . PELVIC MASS 03/27/2009  . ABSCESS OF INTESTINE 02/19/2009  . VITAMIN B12 DEFICIENCY 11/21/2007  . OBESITY 11/21/2007  . ESOPHAGEAL STRICTURE 11/21/2007  . HIATAL HERNIA 11/21/2007  . UNSPECIFIED INTESTINAL OBSTRUCTION 11/21/2007  . DIVERTICULOSIS OF COLON 11/21/2007  . OSTEOARTHRITIS 11/21/2007  . DIABETES MELLITUS, HX OF 11/21/2007  . ANEMIA, HX OF 11/21/2007  . HYPERTENSION, HX OF 11/21/2007  . REFLUX ESOPHAGITIS, HX OF 11/21/2007   PCP:  Carol Ada, MD Pharmacy:   [place], [place] - [address] [place] [address] Phone: [phone] Fax: [phone]     Social Determinants of Health (SDOH) Interventions    Readmission Risk Interventions Readmission Risk Prevention Plan 08/20/2020  Transportation Screening Complete  PCP or Specialist Appt within 3-5 Days Complete  HRI or Home Care Consult Complete  Social Work Consult for [place] Planning/Counseling Complete  Palliative Care Screening Not Applicable  Medication Review Press photographer) Complete  Some recent data might be hidden

## 2020-11-02 NOTE — ED Provider Notes (Signed)
Patient taken in sign out from PA upstill. Here after mechanical fall. Having  Unable to walk, but does NOT want SNF placement. Currently awaiting PT eval.   CT has evaluated the patient and recommends skilled nursing facility placement given her inability to ambulate.  Patient's son is still currently looking for private duty help at the home and CSW is currently working on skilled nursing facility placement.  Patient is agreeable to placement if they are unable to find anyone at home, which appears to be the case at this time.  I ordered all of the patient's home medications and a diet.    Margarita Mail, PA-C 11/02/20 1543    Lucrezia Starch, MD 11/04/20 1331

## 2020-11-02 NOTE — ED Provider Notes (Signed)
[facility] EMERGENCY DEPARTMENT Provider Note   CSN: [id number] Arrival date & time: 11/01/20  1836     History Chief Complaint  Patient presents with  . Knee Pain  . Fall    Tricia Gonzales is a 80 y.o. female.  Patient with history of atrial fibrillation on Eliquis, CHF, HTN, T2DM, HLD, pacemaker, osteoarthritis, presents after mechanical fall. She tripped while walking across her deck and reports the brunt of the fall to the left knee. She reports left knee and upper leg pain. History of left hip and knee replacement. She also hit her right hand causing pain and bruising over 2nd MCP joint. No LOC, nausea, chest pain, SOB or abdominal pain.   The history is provided by the patient. No language interpreter was used.  Knee Pain Associated symptoms: no fever   Fall Pertinent negatives include no chest pain, no abdominal pain, no headaches and no shortness of breath.       Past Medical History:  Diagnosis Date  . Anemia   . 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 Anxiety   . Arthritis   . Atrial fibrillation ([place])   . Avascular necrosis (HCC) hip left   leg pain also  . Chronic back pain   . Chronic kidney disease (CKD), stage III (moderate) (HCC)   . Depression   . Diverticulosis   . Early cataracts, bilateral   . Esophageal stricture   . Gastritis   . GERD (gastroesophageal reflux disease)   . Glaucoma   . Heart murmur     " some doctors say that I have one some say that I don"t "  . Hiatal hernia   . History of blood transfusion    "@ least w/1st knee OR"  . History of gout   . History of kidney stones   . Hyperlipidemia   . Hypertension   . Intestinal obstruction ([place])   . Kidney stones   . Obesity   . Osteoarthritis   . Osteoporosis   . Pericarditis 2016  . Presence of permanent cardiac pacemaker   . Sjogren's disease ([place])   . Thyroid disease   . Type II diabetes mellitus ([place])   . Vitamin B12 deficiency     Patient Active  Problem List   Diagnosis Date Noted  . Benign esophageal stricture   . Acute blood loss anemia   . Symptomatic anemia   . Acute diastolic heart failure ([place]) 08/15/2020  . AKI (acute kidney injury) ([place]) 08/15/2020  . CHF (congestive heart failure) ([place]) 08/15/2020  . Atrial flutter ([place])   . Sinus node dysfunction ([place]) 09/06/2019  . Status post left hip replacement 06/28/2019  . Urinary retention 05/12/2019  . Polyneuropathy associated with underlying disease ([place]) 05/12/2019  . Immobility 04/26/2019  . Low ferritin 04/24/2019  . Primary osteoarthritis of left hip 03/29/2019  . GAVE (gastric antral vascular ectasia)   . Avascular necrosis of bone of hip, left ([place]) 08/22/2018  . Vitamin B12 deficiency   . Type II diabetes mellitus ([place])   . Sjogren's disease ([place])   . Presence of permanent cardiac pacemaker   . Pericarditis   . Osteoarthritis   . Obesity   . Kidney stones   . Intestinal obstruction ([place])   . History of gout   . Hiatal hernia   . History of blood transfusion   . Heart murmur   . GERD (gastroesophageal reflux disease)   . Gastritis   . Esophageal  stricture   . Early cataracts, bilateral   . Diverticulosis   . Depression   . Chronic kidney disease (CKD), stage III (moderate) (HCC)   . Chronic back pain   . Atrial fibrillation ([place])   . Arthritis   . Anxiety   . Anemia   . Osteoarthritis of finger of right hand 01/22/2016  . Pain 01/22/2016  . Primary osteoarthritis of both first carpometacarpal joints 01/22/2016  . 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 Chest pain at rest   . Heme positive stool   . UTI (urinary tract infection) 08/09/2015  . Acute pericarditis   . Chest pain 08/06/2015  . Diabetes mellitus type 2, controlled ([place]) 08/06/2015  . Hyperlipidemia 08/06/2015  . Hypertension 08/06/2015  . CKD (chronic kidney disease) stage 3, GFR 30-59 ml/min (HCC) 08/06/2015  . Chronic anemia 08/06/2015  . Hyperkalemia   . Pain in the  chest   . Pacemaker   . 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 Osteoporosis 03/22/2015  . Abdominal pain, left lower quadrant 01/07/2012  . Syncope 09/15/2011  . Junctional escape rhythm 09/15/2011  . PELVIC MASS 03/27/2009  . ABSCESS OF INTESTINE 02/19/2009  . VITAMIN B12 DEFICIENCY 11/21/2007  . OBESITY 11/21/2007  . ESOPHAGEAL STRICTURE 11/21/2007  . HIATAL HERNIA 11/21/2007  . UNSPECIFIED INTESTINAL OBSTRUCTION 11/21/2007  . DIVERTICULOSIS OF COLON 11/21/2007  . OSTEOARTHRITIS 11/21/2007  . DIABETES MELLITUS, HX OF 11/21/2007  . ANEMIA, HX OF 11/21/2007  . HYPERTENSION, HX OF 11/21/2007  . REFLUX ESOPHAGITIS, HX OF 11/21/2007    Past Surgical History:  Procedure Laterality Date  . ABDOMINAL HYSTERECTOMY     complete  . CARDIOVERSION N/A 07/31/2020   Procedure: CARDIOVERSION;  Surgeon: Werner Lean, MD;  Location: [facility] ENDOSCOPY;  Service: Cardiovascular;  Laterality: N/A;  . CHOLECYSTECTOMY OPEN    . COLECTOMY     for rectovaginal fistula  . COLONOSCOPY  01/07/2012   Procedure: COLONOSCOPY;  Surgeon: Lafayette Dragon, MD;  Location: [facility] ENDOSCOPY;  Service: Endoscopy;  Laterality: N/A;  . EP IMPLANTABLE DEVICE N/A 06/18/2015   Procedure: Pacemaker Implant;  Surgeon: Evans Lance, MD;  Location: [facility] CV LAB;  Service: Cardiovascular;  Laterality: N/A;  . ESOPHAGEAL DILATION  08/17/2020   Procedure: ESOPHAGEAL DILATION;  Surgeon: Lavena Bullion, DO;  Location: [facility] ENDOSCOPY;  Service: Gastroenterology;;  . ESOPHAGOGASTRODUODENOSCOPY N/A 11/24/2018   Procedure: ESOPHAGOGASTRODUODENOSCOPY (EGD);  Surgeon: Yetta Flock, MD;  Location: [facility];  Service: Gastroenterology;  Laterality: N/A;  . ESOPHAGOGASTRODUODENOSCOPY (EGD) WITH ESOPHAGEAL DILATION    . ESOPHAGOGASTRODUODENOSCOPY (EGD) WITH PROPOFOL N/A 08/12/2015   Procedure: ESOPHAGOGASTRODUODENOSCOPY (EGD) WITH PROPOFOL;  Surgeon: Manus Gunning, MD;  Location: [facility];  Service:  Gastroenterology;  Laterality: N/A;  . ESOPHAGOGASTRODUODENOSCOPY (EGD) WITH PROPOFOL N/A 08/17/2020   Procedure: ESOPHAGOGASTRODUODENOSCOPY (EGD) WITH PROPOFOL;  Surgeon: Lavena Bullion, DO;  Location: [facility];  Service: Gastroenterology;  Laterality: N/A;  . EYE SURGERY Bilateral    cataracts  . HOT HEMOSTASIS N/A 11/24/2018   Procedure: HOT HEMOSTASIS (ARGON PLASMA COAGULATION/BICAP);  Surgeon: Yetta Flock, MD;  Location: [facility];  Service: Gastroenterology;  Laterality: N/A;  . HOT HEMOSTASIS N/A 08/17/2020   Procedure: HOT HEMOSTASIS (ARGON PLASMA COAGULATION/BICAP);  Surgeon: Lavena Bullion, DO;  Location[facility] END[facility]COPY;  Service: Gastroenterology;  Laterality: N/A;  . I & D EXTREMITY Right 08/06/2016   Procedure: DEBRIDEMENT PIP RIGHT RING FINGER;  Surgeon: Daryll Brod, MD;  Location: [facility];  Service: Orthopedics;  Laterality: Right;  .  INSERT / REPLACE / REMOVE PACEMAKER    . JOINT REPLACEMENT    . KNEE ARTHROSCOPY Right   . MASS EXCISION Right 08/06/2016   Procedure: EXCISION CYST;  Surgeon: Daryll Brod, MD;  Location: [facility];  Service: Orthopedics;  Laterality: Right;  . REVISION TOTAL KNEE ARTHROPLASTY Left   . TOTAL KNEE ARTHROPLASTY Bilateral      OB History   No obstetric history on file.     Family History  Problem Relation Age of Onset  . Diabetes Mother   . Diabetes Brother   . Hypertension Sister   . Colon cancer Neg Hx   . Heart attack Neg Hx   . Stroke Neg Hx   . Colon polyps Neg Hx   . Esophageal cancer Neg Hx   . Rectal cancer Neg Hx   . Stomach cancer Neg Hx     Social History   Tobacco Use  . Smoking status: Former Smoker    Packs/day: 2.00    Years: 3.00    Pack years: 6.00    Types: Cigarettes    Quit date: 06/23/1983    Years since quitting: 37.3  . Smokeless tobacco: Never Used  Vaping Use  . Vaping Use: Never used  Substance Use Topics  . Alcohol use: Yes    Alcohol/week: 2.0 standard drinks    Types: 2 Glasses of  wine per week    Comment: occ  . Drug use: No    Home Medications Prior to Admission medications   Medication Sig Start Date End Date Taking? Authorizing Provider  ACCU-CHEK AVIVA PLUS test strip 1 each by Other route daily.  03/15/15   [provider]  acetaminophen (TYLENOL) 650 MG CR tablet Take 1,300 mg by mouth every evening.    [provider]  amiodarone (PACERONE) 200 MG tablet Take 1 tablet (200 mg total) by mouth daily. 07/19/20   O'Neal, Cassie Freer, MD  BD PEN NEEDLE NANO U/F 32G X 4 MM MISC See admin instructions. 12/25/19   [provider]  brimonidine (ALPHAGAN) 0.2 % ophthalmic solution Place 1 drop into the right eye 2 (two) times daily. 10/14/20   [provider]  carvedilol (COREG) 3.125 MG tablet TAKE TWO TABLETS IN THE MORNING AND TAKEONE TABLET IN THE EVENING 10/01/20   O'Neal, Cassie Freer, MD  Cholecalciferol (VITAMIN D3) 25 MCG (1000 UT) CAPS Take 1,000 Units by mouth daily.    [provider]  dorzolamide (TRUSOPT) 2 % ophthalmic solution Place 1 drop into the right eye 2 (two) times daily. 05/12/19   Wille Celeste, PA-C  ELIQUIS 5 MG TABS tablet TAKE ONE TABLET TWICE DAILY 09/17/20   Evans Lance, MD  erythromycin ophthalmic ointment SMARTSIG:In Eye(s) Every Evening 10/14/20   [provider]  febuxostat (ULORIC) 40 MG tablet Take 1 tablet (40 mg total) by mouth every Monday, Wednesday, and Friday. 05/12/19   [place]an[place] Lewis C, PA-C  ferrous sulfate 325 (65 FE) MG EC tablet Take 1 tablet (325 mg total) by mouth 2 (two) times daily. 09/27/15   Ladell Pier, MD  gabapentin (NEURONTIN) 300 MG capsule Take 1 capsule (300 mg total) by mouth daily. 05/12/19   Granville Lewis C, PA-C  gabapentin (NEURONTIN) 400 MG capsule Take 1 capsule (400 mg total) by mouth at bedtime. 05/12/19   [place]ville Lewis C, PA-C  JANUVIA 25 MG tablet Take 25 mg by mouth daily. 08/13/20   [provider]  levothyroxine (SYNTHROID) 50 MCG tablet  Take 1 tablet (50 mcg total) by mouth at bedtime. 05/12/19   Granville Lewis C, PA-C  lidocaine (LIDODERM) 5 % Place 1-3 patches onto the skin See admin instructions. Apply 1-3 patches transdermally every 24 hours as needed for pain and remove & discard patches within 12 hours or as directed by MD    [provider]  montelukast (SINGULAIR) 10 MG tablet Take 1 tablet (10 mg total) by mouth at bedtime. 05/12/19   [place]anville Lewis C, PA-C  nystatin [facility]) powder APPLY TOPICALLY TO LEFT ABDOMINAL FOLD AND GROIN TWICE A DAY 05/12/19   Oscar La, Arlo C, PA-C  ondansetron (ZOFRAN ODT) 4 MG disintegrating tablet Take 1 tablet (4 mg total) by mouth every 6 (six) hours as needed for nausea or vomiting. 10/08/20   Armbruster, Carlota Raspberry, MD  pantoprazole (PROTONIX) 40 MG tablet Take 1 tablet (40 mg total) by mouth 2 (two) times daily before a meal for 28 days, THEN 1 tablet (40 mg total) daily. 08/20/20 12/16/20  Pokhrel, Corrie Mckusick, MD  pentoxifylline (TRENTAL) 400 MG CR tablet Take 1 tablet (400 mg total) by mouth daily. 05/12/19   Wille Celeste, PA-C  Polyethylene Glycol 3350 (MIRALAX PO) Take 17 g by mouth See admin instructions. Mix 17 grams of powder into a desired beverage and drink every morning    [provider]  Probiotic Product (PROBIOTIC DAILY PO) Take 1 capsule by mouth daily.    [provider]  promethazine (PHENERGAN) 12.5 MG tablet Take 1 tablet (12.5 mg total) by mouth every 8 (eight) hours as needed for nausea or vomiting. 10/09/20   Armbruster, Carlota Raspberry, MD  rOPINIRole (REQUIP) 0.5 MG tablet Take 0.5 mg by mouth at bedtime. 02/23/20   [provider]  rosuvastatin (CRESTOR) 5 MG tablet Take 1 tablet (5 mg total) by mouth daily. 05/12/19   Granville Lewis C, PA-C  senna-docusate (SENOKOT-S) 8.6-50 MG tablet Take 1 tablet by mouth at bedtime.    [provider]  torsemide (DEMADEX) 20 MG tablet TAKE TWO TABLETS EVERY DAY 10/14/20   [provider]  Torsemide 40  MG TABS Take 40 mg by mouth as needed (fluid). 08/20/20   [provider]  traMADol (ULTRAM) 50 MG tablet Take 50 mg by mouth See admin instructions. Take 50 mg by mouth at bedtime and an additional 50 mg once a day as needed for pain 02/13/20   [provider]  TRESIBA FLEXTOUCH 100 UNIT/ML FlexTouch Pen Inject 10 Units into the skin every evening. 02/21/20   [provider]    Allergies    Norvasc [amlodipine besylate], Other, Avapro [irbesartan], Glucophage [metformin hydrochloride], Lisinopril, Losartan, Morphine and related, Simvastatin, and Trulicity [dulaglutide]  Review of Systems   Review of Systems  Constitutional: Negative for chills and fever.  Respiratory: Negative.  Negative for shortness of breath.   Cardiovascular: Negative.  Negative for chest pain.  Gastrointestinal: Negative.  Negative for abdominal pain.  Musculoskeletal: Negative.        See HPI.  Skin: Positive for color change.  Neurological: Negative.  Negative for syncope, weakness and headaches.    Physical Exam Updated Vital Signs BP (!) 171/62 (BP Location: Left Arm)   Pulse 61   Temp 98.5 F (36.9 C) (Oral)   Resp 18   Ht 4' 10.5" (1.486 m)   Wt 81.6 kg   SpO2 98%   BMI 36.96 kg/m   Physical Exam Vitals and nursing note reviewed.  Constitutional:  Appearance: She is well-developed.  HENT:     Head: Normocephalic.  Cardiovascular:     Rate and Rhythm: Normal rate.  Pulmonary:     Effort: Pulmonary effort is normal.  Chest:     Chest wall: No tenderness.  Abdominal:     Palpations: Abdomen is soft.     Tenderness: There is no abdominal tenderness. There is no guarding or rebound.     Comments: No abdominal wall bruising.  Musculoskeletal:        General: Normal range of motion.     Cervical back: Normal range of motion and neck supple.     Comments: Exam limited by body habitus Tender of left hip, worse over mid thigh and knee. Distal pulses present. No wound or  bleeding. The left leg appears shortened. Right hand with bruising over dorsum and tenderness without deformity of the 2nd MCP. No cervical tenderness.   Skin:    General: Skin is warm and dry.     Findings: Bruising present.  Neurological:     Mental Status: She is alert.     Cranial Nerves: No cranial nerve deficit.     ED Results / Procedures / Treatments   Labs (all labs ordered are listed, but only abnormal results are displayed) Labs Reviewed - No data to display  EKG None  Radiology CT Head Wo Contrast  Result Date: 11/01/2020 CLINICAL DATA:  Head trauma EXAM: CT HEAD WITHOUT CONTRAST TECHNIQUE: Contiguous axial images were obtained from the base of the skull through the vertex without intravenous contrast. COMPARISON:  None. FINDINGS: Brain: No acute territorial infarction, hemorrhage, or intracranial mass. The ventricles are nonenlarged. Mild to moderate atrophy Vascular: No hyperdense vessels.  Carotid vascular calcification Skull: Normal. Negative for fracture or focal lesion. Sinuses/Orbits: No acute finding. Other: None IMPRESSION: 1. No CT evidence for acute intracranial abnormality. 2. Atrophy Electronically Signed   By: Donavan Foil M.D.   On: 11/01/2020 20:19   DG Knee Complete 4 Views Left  Result Date: 11/01/2020 CLINICAL DATA:  Pain after fall EXAM: LEFT KNEE - COMPLETE 4+ VIEW COMPARISON:  10/18/2009 FINDINGS: Left knee replacement with interval revision since 2011. No definitive fracture or malalignment. Probable knee effusion. IMPRESSION: Knee replacement without definitive fracture.  Probable effusion Electronically Signed   By: Donavan Foil M.D.   On: 11/01/2020 20:17    Procedures Procedures   Medications Ordered in ED Medications  oxyCODONE-acetaminophen (PERCOCET/ROXICET) 5-325 MG per tablet 1 tablet (has no administration in time range)    ED Course  I have reviewed the triage vital signs and the nursing notes.  Pertinent labs & imaging results  that were available during my care of the patient were reviewed by me and considered in my medical decision making (see chart for details).    MDM Rules/Calculators/A&P                          Patient to ED after mechanical fall earlier in the day. Reports significant left knee and thigh pain. History of left knee and hip replacement. On Eliquis secondary a-fib.  Exam is limited by extreme obesity but no bony deformities appreciated. She reports now have pain the back and a headache - reporting progressive soreness over time.   Imaging does not show any acute fractures. Head CT negative for bleedin/injury. Percocet for pain provided x 2 which she reports improves her pain.   On attempt to ambulate the patient, she  declines and states she still can't move the knee secondary to pain. She lives alone and has a cane and a walker but does not regularly use either.   She is felt to be at high risk of recurrent fall. Will have PT and TOC evaluation in the morning to determine best disposition for this patient.    Final Clinical Impression(s) / ED Diagnoses Final diagnoses:  None   1. Fall 2. Left lower extremity pain 3. Coagulopathy   Rx / DC Orders ED Discharge Orders    None       Dennie Bible 11/02/20 0745    Fatima Blank, MD 11/04/20 2111

## 2020-11-02 NOTE — ED Notes (Signed)
Pt requesting pain medication oxycodone. States pain worsen. Medication earlier without relief. Secure message sent to PA

## 2020-11-02 NOTE — ED Notes (Signed)
Physical Therapy at bedside

## 2020-11-03 LAB — CBG MONITORING, ED
Glucose-Capillary: 154 mg/dL — ABNORMAL HIGH (ref 70–99)
Glucose-Capillary: 186 mg/dL — ABNORMAL HIGH (ref 70–99)

## 2020-11-03 MED ORDER — FERROUS SULFATE 325 (65 FE) MG PO TABS
325.0000 mg | ORAL_TABLET | Freq: Every day | ORAL | Status: DC
  Administered 2020-11-04: 325 mg via ORAL
  Filled 2020-11-03: qty 1

## 2020-11-03 MED ORDER — NYSTATIN 100000 UNIT/GM EX POWD
Freq: Three times a day (TID) | CUTANEOUS | Status: DC
  Filled 2020-11-03: qty 15

## 2020-11-03 MED ORDER — INSULIN GLARGINE 100 UNIT/ML ~~LOC~~ SOLN
6.0000 [IU] | Freq: Every evening | SUBCUTANEOUS | Status: DC
  Administered 2020-11-03: 6 [IU] via SUBCUTANEOUS
  Filled 2020-11-03 (×2): qty 0.06

## 2020-11-03 MED ORDER — FEBUXOSTAT 40 MG PO TABS
40.0000 mg | ORAL_TABLET | ORAL | Status: DC
  Administered 2020-11-04: 40 mg via ORAL
  Filled 2020-11-03: qty 1

## 2020-11-03 NOTE — Progress Notes (Signed)
Physical Therapy Treatment Patient Details Name: Tricia Gonzales MRN: [id number] DOB: 31-Aug-1940 Today's Date: 11/03/2020    History of Present Illness 80 y.o. female admitted on 11/01/20 to the ED for fall with L knee, hip pain and R hand pain.  Per x-rays she did not sustain any acute fractures, however, is very painful.  Pt with significant PMH of DM2, cardiac pacemaker, HTN, gout, obesity, HTN, glaucoma, diverticulitis, CKD III, AVN L hip s/p THA, A-fib, bil TKA and L knee revision, colectomy.    PT Comments    Pt with increased tolerance for out of bed activity this session and requiring less assistance to ambulate. Pt is able to progress to stair training with cues for technique and assistance for safety. Pt continues to be challenged by transferring from lower surfaces due to lack of LE strength and L knee flexion ROM. PT provides HEP to improve strength and ROM. Pt continues to remain sure about mobility and is unable to identify caregiver support at home for the end of the upcoming week. Due to increased falls risk PT continues to recommend SNF placement at this time.   Follow Up Recommendations  SNF (HHPT if pt declines SNF. Pt will also benefit from assistance for all out of bed/chair mobility if returning home initially)     Equipment Recommendations  None recommended by PT (pt owns RW and 3in1 commode)    Recommendations for Other Services       Precautions / Restrictions Precautions Precautions: Fall Restrictions Weight Bearing Restrictions: No    Mobility  Bed Mobility Overal bed mobility: Needs Assistance Bed Mobility: Supine to Sit;Sit to Supine     Supine to sit: Supervision;HOB elevated Sit to supine: Min guard   General bed mobility comments: use of bed rails. Pt does require assistance to reposition in bed    Transfers Overall transfer level: Needs assistance Equipment used: Rolling walker (2 wheeled) (bariatric) Transfers: Sit to/from Stand Sit to Stand:  Min assist;Min guard         General transfer comment: minA from lower wheelchair seat, minG from edge of bed, PT cues for hand placement and to increase knee flexion  Ambulation/Gait Ambulation/Gait assistance: Min guard Gait Distance (Feet): 50 Feet Assistive device: Rolling walker (2 wheeled) (bariatric) Gait Pattern/deviations: Step-through pattern Gait velocity: reduced Gait velocity interpretation: <1.8 ft/sec, indicate of risk for recurrent falls General Gait Details: pt with slowed step-through gait, decreased stance time on LLE   Stairs Stairs: Yes Stairs assistance: Min guard Stair Management: One rail Left;Sideways;Step to pattern Number of Stairs: 2 (2 consecutive trials of 2 steps)     Wheelchair Mobility    Modified Rankin (Stroke Patients Only)       Balance Overall balance assessment: Needs assistance Sitting-balance support: No upper extremity supported;Feet supported Sitting balance-Leahy Scale: Good     Standing balance support: Single extremity supported;Bilateral upper extremity supported Standing balance-Leahy Scale: Poor Standing balance comment: reliant on UE support                            Cognition Arousal/Alertness: Awake/alert Behavior During Therapy: WFL for tasks assessed/performed Overall Cognitive Status: Within Functional Limits for tasks assessed                                        Exercises General Exercises - Lower Extremity Long  Arc Quad: AROM;Both;10 reps Heel Slides: AROM;AAROM;Left;10 reps    General Comments General comments (skin integrity, edema, etc.): VSS on RA      Pertinent Vitals/Pain Pain Assessment: Faces Faces Pain Scale: Hurts little more Pain Location: left knee and leg Pain Descriptors / Indicators: Grimacing Pain Intervention(s): Monitored during session    Home Living                      Prior Function            PT Goals (current goals can now be  found in the care plan section) Acute Rehab PT Goals Patient Stated Goal: goal to return home Progress towards PT goals: Progressing toward goals    Frequency    Min 3X/week      PT Plan Current plan remains appropriate    Co-evaluation              AM-PAC PT "6 Clicks" Mobility   Outcome Measure  Help needed turning from your back to your side while in a flat bed without using bedrails?: A Little Help needed moving from lying on your back to sitting on the side of a flat bed without using bedrails?: A Little Help needed moving to and from a bed to a chair (including a wheelchair)?: A Little Help needed standing up from a chair using your arms (e.g., wheelchair or bedside chair)?: A Little Help needed to walk in hospital room?: A Little Help needed climbing 3-5 steps with a railing? : A Little 6 Click Score: 18    End of Session Equipment Utilized During Treatment: Gait belt Activity Tolerance: Patient tolerated treatment well Patient left: in bed;with call bell/phone within reach;with bed alarm set Nurse Communication: Mobility status PT Visit Diagnosis: Muscle weakness (generalized) (M62.81);Difficulty in walking, not elsewhere classified (R26.2);Pain Pain - Right/Left: Left Pain - part of body: Knee     Time: 0932-6712 PT Time Calculation (min) (ACUTE ONLY): 50 min  Charges:  $Gait Training: 8-22 mins $Therapeutic Exercise: 8-22 mins $Therapeutic Activity: 8-22 mins                     Zenaida Niece, PT, DPT Acute Rehabilitation Pager: [phone]    Zenaida Niece 11/03/2020, 12:11 PM

## 2020-11-03 NOTE — TOC Progression Note (Addendum)
Transition of Care [facility]) - Progression Note    Patient Details  Name: MANILLA STRIETER MRN: [id number] Date of Birth: 07-Oct-1940  Transition of Care [facility]) CM/SW [place], RN Phone Number: 11/03/2020, 11:59 AM  Clinical Narrative:     Patient nurse she will reconsider SNF placement. [facility] accepted called to admissions however left voicemail. Called to [facility], where she stated she was accepted earlier,but declined. Admissions person is gone for the day and will need to be called prior to rounds at 845 in the AM.for consideraiton.  12:25 patient RN relayed message of no admissions teams today. Pt stated that she will go home tomorrow, she will have a aide available to care for her.   Expected Discharge Plan: Skilled Nursing Facility Barriers to Discharge: SNF Pending bed offer  Expected Discharge Plan and Services Expected Discharge Plan: [place] Choice: [place]                                         Social Determinants of Health (SDOH) Interventions    Readmission Risk Interventions Readmission Risk Prevention Plan 08/20/2020  Transportation Screening Complete  PCP or Specialist Appt within 3-5 Days Complete  HRI or [place] Complete  Social Work Consult for [place] Planning/Counseling Complete  Palliative Care Screening Not Applicable  Medication Review Press photographer) Complete  Some recent data might be hidden

## 2020-11-03 NOTE — ED Provider Notes (Signed)
Pt holding for SNF placement.  Pt reports she needs her insulin.  Pt reports no other complaints. PE vitals stable, Lungs normal resp. Heart Regular rhythm. Psych pleasant    Sidney Ace 11/03/20 [id number]    Davonna Belling, MD 11/04/20 [phone]

## 2020-11-03 NOTE — ED Notes (Signed)
P.T. in working with her

## 2020-11-03 NOTE — ED Notes (Signed)
Pt did own bath at sink with setup only, did require assist of one on and off the toilet, now back in room eating her lunch

## 2020-11-03 NOTE — TOC Initial Note (Signed)
Transition of Care (TOC) - Initial/Assessment Note    Patient Details  Name: Tricia Gonzales MRN: [id number] Date of Birth: 12/01/1940  Transition of Care (TOC) CM/SW Contact:    Joey R McKinney, LCSW Phone Number: 11/03/2020, 9:43 AM  Clinical Narrative:                 CSW met with patient to discuss bed offers. CSW noted patient declined and stated she was able to confirm with her son to have a care taker with her beginning Monday. Patient reported the plan is for PT to work with her to check stair ambulation. CSW informed patient if she changes her mind or has other questions to reach out to social work for additional follow-up.   Expected Discharge Plan: Skilled Nursing Facility Barriers to Discharge: SNF Pending bed offer   Patient Goals and CMS Choice Patient states their goals for this hospitalization and ongoing recovery are:: "I don't really want to go, but I know it would help my leg." CMS Medicare.gov Compare Post Acute Care list provided to:: Patient Choice offered to / list presented to : Patient  Expected Discharge Plan and Services Expected Discharge Plan: Skilled Nursing Facility     Post Acute Care Choice: Skilled Nursing Facility                                        Prior Living Arrangements/Services   Lives with:: Self Patient language and need for interpreter reviewed:: Yes Do you feel safe going back to the place where you live?: Yes      Need for Family Participation in Patient Care: No (Comment) Care giver support system in place?: No (comment) Current home services: DME Criminal Activity/Legal Involvement Pertinent to Current Situation/Hospitalization: No - Comment as needed  Activities of Daily Living      Permission Sought/Granted Permission sought to share information with : Facility Contact Representative Permission granted to share information with : Yes, Verbal Permission Granted              Emotional  Assessment Appearance:: Appears stated age Attitude/Demeanor/Rapport: Charismatic Affect (typically observed): Calm Orientation: : Oriented to Self,Oriented to Place,Oriented to  Time,Oriented to Situation Alcohol / Substance Use: Not Applicable Psych Involvement: No (comment)  Admission diagnosis:  Fall; L Knee Pain Patient Active Problem List   Diagnosis Date Noted  . Benign esophageal stricture   . Acute blood loss anemia   . Symptomatic anemia   . Acute diastolic heart failure (HCC) 08/15/2020  . AKI (acute kidney injury) (HCC) 08/15/2020  . CHF (congestive heart failure) (HCC) 08/15/2020  . Atrial flutter (HCC)   . Sinus node dysfunction (HCC) 09/06/2019  . Status post left hip replacement 06/28/2019  . Urinary retention 05/12/2019  . Polyneuropathy associated with underlying disease (HCC) 05/12/2019  . Immobility 04/26/2019  . Low ferritin 04/24/2019  . Primary osteoarthritis of left hip 03/29/2019  . GAVE (gastric antral vascular ectasia)   . Avascular necrosis of bone of hip, left (HCC) 08/22/2018  . Vitamin B12 deficiency   . Type II diabetes mellitus (HCC)   . Sjogren's disease (HCC)   . Presence of permanent cardiac pacemaker   . Pericarditis   . Osteoarthritis   . Obesity   . Kidney stones   . Intestinal obstruction (HCC)   . History of gout   . Hiatal hernia   . History of   blood transfusion   . Heart murmur   . GERD (gastroesophageal reflux disease)   . Gastritis   . Esophageal stricture   . Early cataracts, bilateral   . Diverticulosis   . Depression   . Chronic kidney disease (CKD), stage III (moderate) (HCC)   . Chronic back pain   . Atrial fibrillation (HCC)   . Arthritis   . Anxiety   . Anemia   . Osteoarthritis of finger of right hand 01/22/2016  . Pain 01/22/2016  . Primary osteoarthritis of both first carpometacarpal joints 01/22/2016  . 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 Chest pain at rest   . Heme positive stool   . UTI  (urinary tract infection) 08/09/2015  . Acute pericarditis   . Chest pain 08/06/2015  . Diabetes mellitus type 2, controlled (HCC) 08/06/2015  . Hyperlipidemia 08/06/2015  . Hypertension 08/06/2015  . CKD (chronic kidney disease) stage 3, GFR 30-59 ml/min (HCC) 08/06/2015  . Chronic anemia 08/06/2015  . Hyperkalemia   . Pain in the chest   . Pacemaker   . 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 Osteoporosis 03/22/2015  . Abdominal pain, left lower quadrant 01/07/2012  . Syncope 09/15/2011  . Junctional escape rhythm 09/15/2011  . PELVIC MASS 03/27/2009  . ABSCESS OF INTESTINE 02/19/2009  . VITAMIN B12 DEFICIENCY 11/21/2007  . OBESITY 11/21/2007  . ESOPHAGEAL STRICTURE 11/21/2007  . HIATAL HERNIA 11/21/2007  . UNSPECIFIED INTESTINAL OBSTRUCTION 11/21/2007  . DIVERTICULOSIS OF COLON 11/21/2007  . OSTEOARTHRITIS 11/21/2007  . DIABETES MELLITUS, HX OF 11/21/2007  . ANEMIA, HX OF 11/21/2007  . HYPERTENSION, HX OF 11/21/2007  . REFLUX ESOPHAGITIS, HX OF 11/21/2007   PCP:  Smith, Candace, MD Pharmacy:   [facility] - [place] - [address] [place] [address] Phone: [phone] Fax: [phone]     Social Determinants of Health (SDOH) Interventions    Readmission Risk Interventions Readmission Risk Prevention Plan 08/20/2020  Transportation Screening Complete  PCP or Specialist Appt within 3-5 Days Complete  HRI or Home Care Consult Complete  Social Work Consult for Recovery Care Planning/Counseling Complete  Palliative Care Screening Not Applicable  Medication Review (RN Care Manager) Complete  Some recent data might be hidden    

## 2020-11-04 NOTE — ED Notes (Signed)
This Probation officer confirmed with Pt that she has 24 hr care at home. Pt reported her daughter will pick her up this morning . Pt also reported her son will met her and her daughter at her home this AM. Pt has arranged for 24hr care at home .

## 2020-11-04 NOTE — ED Notes (Signed)
Daughter at bed side for DC home.

## 2020-11-04 NOTE — ED Notes (Signed)
Pt assisted to BR with walker.

## 2020-11-04 NOTE — ED Notes (Signed)
TC to SW for confirm Home Health is set up for PT.  Pt may be DC home and Home Health will call Pt to set up appt.

## 2020-11-04 NOTE — Discharge Instructions (Addendum)
Follow-up with your primary care doctor.  Return to ER for any additional falls.

## 2020-11-12 ENCOUNTER — Inpatient Hospital Stay: Payer: Medicare Other

## 2020-11-12 ENCOUNTER — Other Ambulatory Visit: Payer: Self-pay

## 2020-11-12 ENCOUNTER — Inpatient Hospital Stay (HOSPITAL_BASED_OUTPATIENT_CLINIC_OR_DEPARTMENT_OTHER): Payer: Medicare Other | Admitting: Oncology

## 2020-11-12 VITALS — BP 126/64 | HR 81 | Temp 97.8°F | Resp 19 | Ht <= 58 in | Wt 185.4 lb

## 2020-11-12 DIAGNOSIS — M8949 Other hypertrophic osteoarthropathy, multiple sites: Secondary | ICD-10-CM

## 2020-11-12 DIAGNOSIS — D631 Anemia in chronic kidney disease: Secondary | ICD-10-CM

## 2020-11-12 DIAGNOSIS — E1122 Type 2 diabetes mellitus with diabetic chronic kidney disease: Secondary | ICD-10-CM | POA: Diagnosis not present

## 2020-11-12 DIAGNOSIS — M3500 Sicca syndrome, unspecified: Secondary | ICD-10-CM

## 2020-11-12 DIAGNOSIS — N189 Chronic kidney disease, unspecified: Secondary | ICD-10-CM

## 2020-11-12 DIAGNOSIS — Z7901 Long term (current) use of anticoagulants: Secondary | ICD-10-CM

## 2020-11-12 DIAGNOSIS — N183 Chronic kidney disease, stage 3 unspecified: Secondary | ICD-10-CM | POA: Diagnosis not present

## 2020-11-12 DIAGNOSIS — E1121 Type 2 diabetes mellitus with diabetic nephropathy: Secondary | ICD-10-CM | POA: Diagnosis not present

## 2020-11-12 DIAGNOSIS — Z79899 Other long term (current) drug therapy: Secondary | ICD-10-CM

## 2020-11-12 DIAGNOSIS — I4891 Unspecified atrial fibrillation: Secondary | ICD-10-CM

## 2020-11-12 LAB — CBC WITH DIFFERENTIAL (CANCER CENTER ONLY)
Abs Immature Granulocytes: 0.03 10*3/uL (ref 0.00–0.07)
Basophils Absolute: 0 10*3/uL (ref 0.0–0.1)
Basophils Relative: 0 %
Eosinophils Absolute: 0.1 10*3/uL (ref 0.0–0.5)
Eosinophils Relative: 1 %
HCT: 32.4 % — ABNORMAL LOW (ref 36.0–46.0)
Hemoglobin: 9.8 g/dL — ABNORMAL LOW (ref 12.0–15.0)
Immature Granulocytes: 0 %
Lymphocytes Relative: 5 %
Lymphs Abs: 0.4 10*3/uL — ABNORMAL LOW (ref 0.7–4.0)
MCH: 29.3 pg (ref 26.0–34.0)
MCHC: 30.2 g/dL (ref 30.0–36.0)
MCV: 97 fL (ref 80.0–100.0)
Monocytes Absolute: 0.6 10*3/uL (ref 0.1–1.0)
Monocytes Relative: 7 %
Neutro Abs: 6.4 10*3/uL (ref 1.7–7.7)
Neutrophils Relative %: 87 %
Platelet Count: 326 10*3/uL (ref 150–400)
RBC: 3.34 MIL/uL — ABNORMAL LOW (ref 3.87–5.11)
RDW: 17.2 % — ABNORMAL HIGH (ref 11.5–15.5)
WBC Count: 7.4 10*3/uL (ref 4.0–10.5)
nRBC: 0 % (ref 0.0–0.2)

## 2020-11-12 LAB — FERRITIN: Ferritin: 107 ng/mL (ref 11–307)

## 2020-11-12 LAB — CMP (CANCER CENTER ONLY)
ALT: 9 U/L (ref 0–44)
AST: 14 U/L — ABNORMAL LOW (ref 15–41)
Albumin: 3.8 g/dL (ref 3.5–5.0)
Alkaline Phosphatase: 85 U/L (ref 38–126)
Anion gap: 9 (ref 5–15)
BUN: 90 mg/dL — ABNORMAL HIGH (ref 8–23)
CO2: 28 mmol/L (ref 22–32)
Calcium: 9.1 mg/dL (ref 8.9–10.3)
Chloride: 98 mmol/L (ref 98–111)
Creatinine: 2.08 mg/dL — ABNORMAL HIGH (ref 0.44–1.00)
GFR, Estimated: 24 mL/min — ABNORMAL LOW (ref >60)
Glucose, Bld: 154 mg/dL — ABNORMAL HIGH (ref 70–99)
Potassium: 4.8 mmol/L (ref 3.5–5.1)
Sodium: 135 mmol/L (ref 135–145)
Total Bilirubin: 0.6 mg/dL (ref 0.3–1.2)
Total Protein: 6.7 g/dL (ref 6.5–8.1)

## 2020-11-12 LAB — IRON AND TIBC
Iron: 53 ug/dL (ref 28–170)
Saturation Ratios: 15 % (ref 10.4–31.8)
TIBC: 364 ug/dL (ref 250–450)
UIBC: 311 ug/dL

## 2020-11-12 MED ORDER — EPOETIN ALFA-EPBX 10000 UNIT/ML IJ SOLN
20000.0000 [IU] | Freq: Once | INTRAMUSCULAR | Status: AC
  Administered 2020-11-12: 20000 [IU] via SUBCUTANEOUS
  Filled 2020-11-12: qty 2

## 2020-11-12 MED ORDER — EPOETIN ALFA-EPBX 40000 UNIT/ML IJ SOLN
40000.0000 [IU] | Freq: Once | INTRAMUSCULAR | Status: AC
  Administered 2020-11-12: 40000 [IU] via SUBCUTANEOUS
  Filled 2020-11-12: qty 1

## 2020-11-12 NOTE — Progress Notes (Signed)
[place] OFFICE PROGRESS NOTE   Diagnosis: Anemia, chronic GI bleeding, chronic renal failure  INTERVAL HISTORY:   Tricia Gonzales returns as scheduled.  She is taking iron once daily.  She has been receiving Aranesp every 2 to 3 weeks.  No bleeding.  She recently fell and developed bruises at the left greater than right knee.  Objective:  Vital signs in last 24 hours:  Blood pressure 126/64, pulse 81, temperature 97.8 F (36.6 C), temperature source Oral, resp. rate 19, height _0  (1.473 m), weight 185 lb 6.4 oz (84.1 kg), SpO2 93 %.    Resp: Lungs clear bilaterally Cardio: Regular rate and rhythm GI: No hepatosplenomegaly Vascular: Trace ankle edema bilaterally  Skin: Ecchymoses at both knees and pretibial area    Lab Results:  Lab Results  Component Value Date   WBC 7.4 11/12/2020   HGB 9.8 (L) 11/12/2020   HCT 32.4 (L) 11/12/2020   MCV 97.0 11/12/2020   PLT 326 11/12/2020   NEUTROABS 6.4 11/12/2020    CMP  Lab Results  Component Value Date   NA 135 11/12/2020   K 4.8 11/12/2020   CL 98 11/12/2020   CO2 28 11/12/2020   GLUCOSE 154 (H) 11/12/2020   BUN 90 (H) 11/12/2020   CREATININE 2.08 (H) 11/12/2020   CALCIUM 9.1 11/12/2020   PROT 6.7 11/12/2020   ALBUMIN 3.8 11/12/2020   AST 14 (L) 11/12/2020   ALT 9 11/12/2020   ALKPHOS 85 11/12/2020   BILITOT 0.6 11/12/2020   GFRNONAA 24 (L) 11/12/2020   GFRAA 24 (L) 08/05/2020    Medications: I have reviewed the patient's current medications.   Assessment/Plan: 1. Normocytic anemia-chronic  normal ferritin, serum iron studies-low percent transferrin saturation, normal transferrin  Hemoccult positive stool February 2017, upper endoscopy 08/12/2015 with no source for bleeding  09/27/2015 erythropoietin 40,000 units weekly initiated  09/27/2015 erythropoietin level 48.3 (range 2.6-18.5)  10/11/2015 serum protein electrophoresis with no M spike observed; serum IFE shows IgA monoclonal  protein with lambda light chain specificity; quantitative immunoglobulins show IgG mildly decreased 654 (range [phone]), IgA and IgM in normal range; polyclonal serum light chain elevation  11/15/2015-hemoglobin 11.2  11/29/2015-hemoglobin 11.1  12/12/2005-hemoglobin 10.4; Aranesp 100 g every 2 weeks initiated  02/07/2016-hemoglobin 11.0, Aranesp held, changed to an every 3 week schedule  Progressive anemia and decreased iron stores December 2017, treated with IV iron January 2018 with improvement  She did not require Aranesp for 2 months and resumed Aranesp on a monthly schedule for 08/2016  Aranesp every 6 weeks beginning 05/04/2017  Progressive anemia and decreased iron stores 07/05/2017; IV irongiven2/11/2017 and 08/06/2017  Persistent severe anemia, Aranesp change to every 2 weeks beginning 10/19/2017  Persistent anemia, decreased iron stores 11/30/2017  IV iron 12/13/2017, 12/20/2017,  Decreased iron stores 05/17/2018  IV iron1/08/2018  07/05/2018-no serum M spike, polyclonal serum light chain elevation, IgG mildly decreased with IgA and IgM within normal range  2 units of blood 08/17/2018  IV iron 08/17/2018 and 08/24/2018  Stool heme +08/30/2018, referred to GI  2 units of blood 09/01/2018  1 unit of blood 10/25/2018  11/11/2018 colonoscopy-diverticulosis in the entire examined colon. End-to-end colocolonic anastomosis. One 5 mm polyp in the ascending colon. No cause for anemia.  11/11/2018 upper endoscopy-evidence of gastric antral vascular ectasia which is the likely cause of the patient's worsening anemia.  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  Hemoglobin stabilized following treatment of  theGave  Every 6-week erythropoietin 06/22/2019, erythropoietin dose increased to 60,000 units 09/14/2019  Erythropoietin 60,000 units every 3 weeks 10/24/2019  Schedule changed to every 4  weeks 12/15/2019  IV iron 02/21/2020 and 02/29/2020, 06/25/2020, 07/03/2020  Erythropoietin adjusted to every 2 weeks beginning 05/23/2020  Erythropoietin changed to every 3 weeks beginning 11/12/2020  2. Chronic renal insufficiency  3. Atrial fibrillation-maintained on apixaban  4. Osteoarthritis  5. Diabetes  6. Sjogren's disease  7. Pericarditis February 2017  8.Colonoscopy 01/07/2012-prior segmental colectomy in the sigmoid colon; moderate diverticulosis throughout the colon; otherwise normal examination    Disposition: Tricia Gonzales appears stable.  The hemoglobin is in goal range.  She will continue erythropoietin.  We will try increasing the erythropoietin interval to every 3 weeks.  She continues iron.  We will follow-up on iron studies from today.  She will return for an office visit in 9 weeks.  I encouraged her to obtain the second COVID-19 booster vaccine.  [name], MD  11/12/2020  12:22 PM

## 2020-11-12 NOTE — Patient Instructions (Signed)

## 2020-11-14 IMAGING — CT CT L SPINE W/O CM
1 of 7 series · 5 of 14 positions shown, 7 images · non-contrast
Comparison: Plain films lumbar spine from [REDACTED]
07/04/2018. CT abdomen and pelvis 08/15/2009.

CLINICAL DATA: Low back and left worse than right leg numbness for
2 months. No known injury.

EXAM:
CT LUMBAR SPINE WITHOUT CONTRAST
TECHNIQUE: Multidetector CT imaging of the lumbar spine was performed without
intravenous contrast administration. Multiplanar CT image
reconstructions were also generated.

[Series 3: l spine soft · axial · 0.38mm/px · z∈[-254,-110]mm · 5 of 74 slices shown, 7 images]
[im 13/74  soft-tissue]
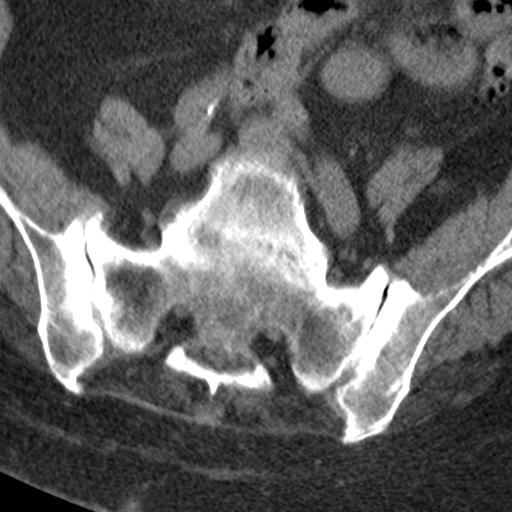
[im 13/74  bone]
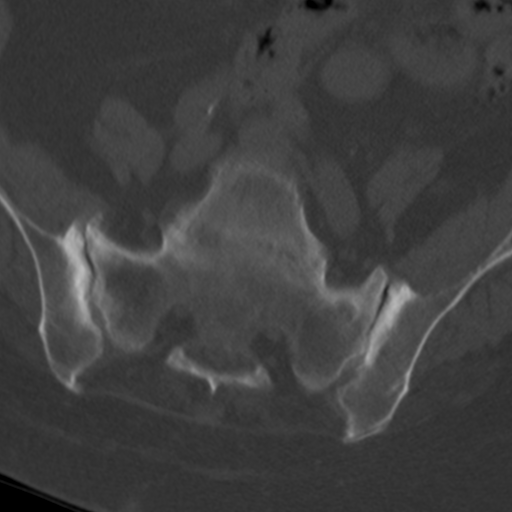
[im 25/74  bone]
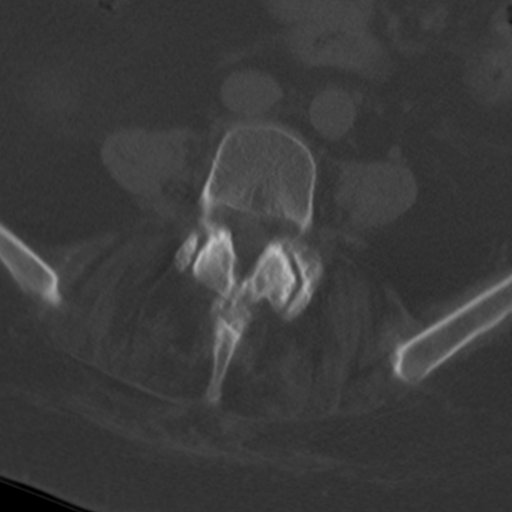
[im 37/74  bone]
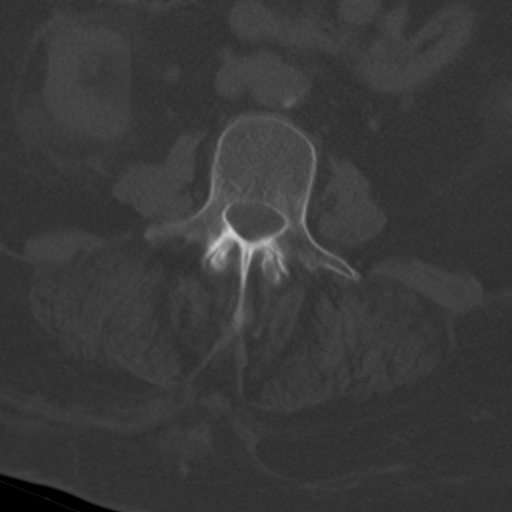
[im 49/74  bone]
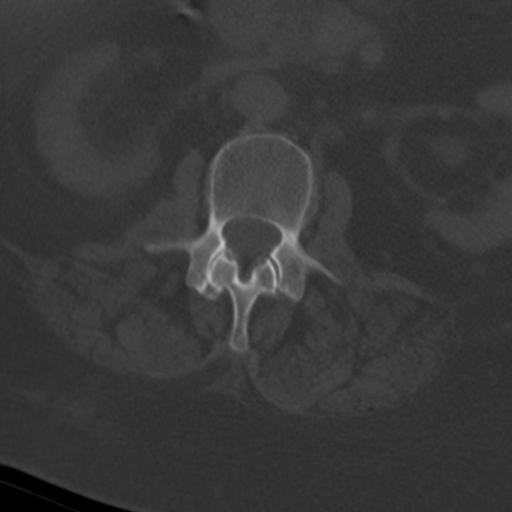
[im 61/74  soft-tissue]
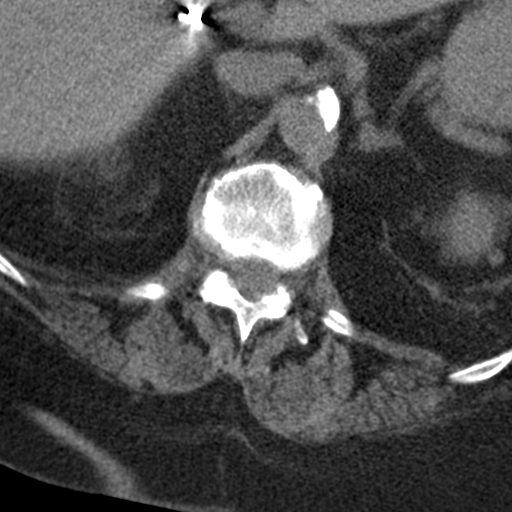
[im 61/74  bone]
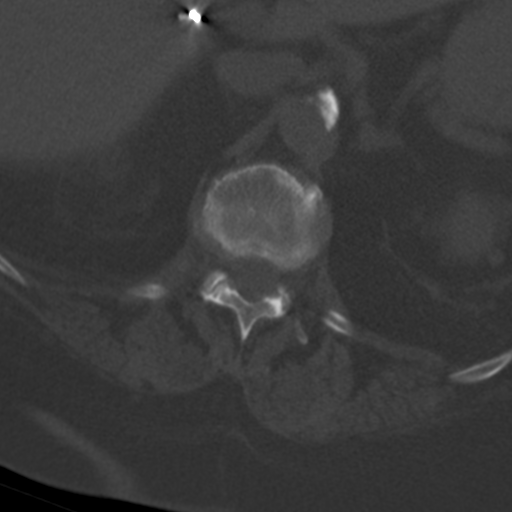

[5 of 14 positions shown; findings below may reference images not displayed]

FINDINGS: Segmentation: Standard.

Alignment: 1.2 cm facet mediated anterolisthesis L5 on S1 is
unchanged since the 9777 study. Alignment is otherwise normal.

Vertebrae: No fracture or focal lesion.

Paraspinal and other soft tissues: Atherosclerosis is noted.
Advanced degenerative disease is present about the SI joints.

Disc levels: T11-12: Loss of disc space height and vacuum disc
phenomenon with only a minimal bulge. No stenosis.

T12-L1: Loss of disc space height vacuum disc phenomenon. There is a
small calcified bulge centrally and eccentric to the left. No
stenosis.

L1-2: Disc space height is maintained. Bilateral facet degenerative
change is worse on the right. There is a shallow bulge but the
central canal and foramina open.

L2-3: Moderate bilateral facet degenerative change and a shallow
disc bulge. No stenosis.

L3-4: There is a disc bulge and bilateral facet degenerative change,
worse on the right. Ligamentum flavum thickening with some
calcifications is identified. There is mild to moderate central
canal stenosis. The foramina are open.

L4-5: Advanced bilateral facet degenerative disease, shallow disc
bulge and ligamentum flavum thickening are present. There is
moderately severe to severe central canal stenosis. The foramina
appear open.

L5-S1: There is autologous fusion across the disc interspace,
chronic. The central canal is open. Moderately severe to severe
foraminal narrowing is worse on the right. Bone encases the right
foramen.
IMPRESSION: Chronic 1.2 cm facet mediated anterolisthesis L5 on S1. Moderately
severe to severe foraminal narrowing at this level is worse on the
right. As noted above, bone encases the right foramen and exiting
right L5 root.

Moderately severe to severe central canal stenosis at L4-5 where
there is advanced facet degenerative disease, ligamentum flavum
thickening and a shallow disc bulge.

## 2020-11-26 ENCOUNTER — Ambulatory Visit: Payer: Medicare Other

## 2020-11-26 ENCOUNTER — Other Ambulatory Visit: Payer: Medicare Other

## 2020-11-27 ENCOUNTER — Ambulatory Visit: Payer: Medicare Other | Admitting: Cardiovascular Disease

## 2020-12-03 ENCOUNTER — Inpatient Hospital Stay: Payer: Medicare Other | Attending: Oncology

## 2020-12-03 ENCOUNTER — Inpatient Hospital Stay: Payer: Medicare Other

## 2020-12-03 ENCOUNTER — Other Ambulatory Visit: Payer: Self-pay

## 2020-12-03 DIAGNOSIS — D631 Anemia in chronic kidney disease: Secondary | ICD-10-CM

## 2020-12-03 DIAGNOSIS — N183 Chronic kidney disease, stage 3 unspecified: Secondary | ICD-10-CM

## 2020-12-03 DIAGNOSIS — D649 Anemia, unspecified: Secondary | ICD-10-CM | POA: Insufficient documentation

## 2020-12-03 DIAGNOSIS — N189 Chronic kidney disease, unspecified: Secondary | ICD-10-CM | POA: Insufficient documentation

## 2020-12-03 LAB — CBC WITH DIFFERENTIAL (CANCER CENTER ONLY)
Abs Immature Granulocytes: 0.03 10*3/uL (ref 0.00–0.07)
Basophils Absolute: 0 10*3/uL (ref 0.0–0.1)
Basophils Relative: 0 %
Eosinophils Absolute: 0.1 10*3/uL (ref 0.0–0.5)
Eosinophils Relative: 1 %
HCT: 36.6 % (ref 36.0–46.0)
Hemoglobin: 11 g/dL — ABNORMAL LOW (ref 12.0–15.0)
Immature Granulocytes: 1 %
Lymphocytes Relative: 8 %
Lymphs Abs: 0.5 10*3/uL — ABNORMAL LOW (ref 0.7–4.0)
MCH: 29.4 pg (ref 26.0–34.0)
MCHC: 30.1 g/dL (ref 30.0–36.0)
MCV: 97.9 fL (ref 80.0–100.0)
Monocytes Absolute: 0.5 10*3/uL (ref 0.1–1.0)
Monocytes Relative: 8 %
Neutro Abs: 5.1 10*3/uL (ref 1.7–7.7)
Neutrophils Relative %: 82 %
Platelet Count: 192 10*3/uL (ref 150–400)
RBC: 3.74 MIL/uL — ABNORMAL LOW (ref 3.87–5.11)
RDW: 17.7 % — ABNORMAL HIGH (ref 11.5–15.5)
WBC Count: 6.1 10*3/uL (ref 4.0–10.5)
nRBC: 0 % (ref 0.0–0.2)

## 2020-12-03 NOTE — Progress Notes (Signed)
Per Lattie Haw T, PA, hold recrit injection for HGB of 11.

## 2020-12-19 ENCOUNTER — Telehealth: Payer: Self-pay

## 2020-12-19 NOTE — Telephone Encounter (Signed)
I attempted to contact patient to discuss moving appointment from 07/07 to Dr.O'Neal on 07/08, at [facility].  Patient did not answer, unable to leave a message.

## 2020-12-20 NOTE — Telephone Encounter (Signed)
Patient appointment was moved to 07/08.  Thanks!

## 2020-12-24 ENCOUNTER — Ambulatory Visit: Payer: Medicare Other

## 2020-12-24 ENCOUNTER — Other Ambulatory Visit: Payer: Medicare Other

## 2020-12-25 ENCOUNTER — Other Ambulatory Visit: Payer: Self-pay

## 2020-12-25 ENCOUNTER — Inpatient Hospital Stay: Payer: Medicare Other | Attending: Oncology

## 2020-12-25 ENCOUNTER — Inpatient Hospital Stay: Payer: Medicare Other

## 2020-12-25 VITALS — BP 139/63 | HR 66 | Temp 98.4°F | Resp 18

## 2020-12-25 DIAGNOSIS — D649 Anemia, unspecified: Secondary | ICD-10-CM | POA: Insufficient documentation

## 2020-12-25 DIAGNOSIS — N189 Chronic kidney disease, unspecified: Secondary | ICD-10-CM

## 2020-12-25 DIAGNOSIS — N183 Chronic kidney disease, stage 3 unspecified: Secondary | ICD-10-CM

## 2020-12-25 LAB — CBC WITH DIFFERENTIAL (CANCER CENTER ONLY)
Abs Immature Granulocytes: 0.02 10*3/uL (ref 0.00–0.07)
Basophils Absolute: 0 10*3/uL (ref 0.0–0.1)
Basophils Relative: 0 %
Eosinophils Absolute: 0.1 10*3/uL (ref 0.0–0.5)
Eosinophils Relative: 1 %
HCT: 29 % — ABNORMAL LOW (ref 36.0–46.0)
Hemoglobin: 8.9 g/dL — ABNORMAL LOW (ref 12.0–15.0)
Immature Granulocytes: 0 %
Lymphocytes Relative: 6 %
Lymphs Abs: 0.4 10*3/uL — ABNORMAL LOW (ref 0.7–4.0)
MCH: 30.4 pg (ref 26.0–34.0)
MCHC: 30.7 g/dL (ref 30.0–36.0)
MCV: 99 fL (ref 80.0–100.0)
Monocytes Absolute: 0.5 10*3/uL (ref 0.1–1.0)
Monocytes Relative: 7 %
Neutro Abs: 6.2 10*3/uL (ref 1.7–7.7)
Neutrophils Relative %: 86 %
Platelet Count: 199 10*3/uL (ref 150–400)
RBC: 2.93 MIL/uL — ABNORMAL LOW (ref 3.87–5.11)
RDW: 17.7 % — ABNORMAL HIGH (ref 11.5–15.5)
WBC Count: 7.2 10*3/uL (ref 4.0–10.5)
nRBC: 0 % (ref 0.0–0.2)

## 2020-12-25 MED ORDER — EPOETIN ALFA-EPBX 40000 UNIT/ML IJ SOLN
40000.0000 [IU] | Freq: Once | INTRAMUSCULAR | Status: AC
  Administered 2020-12-25: 40000 [IU] via SUBCUTANEOUS

## 2020-12-25 MED ORDER — EPOETIN ALFA-EPBX 10000 UNIT/ML IJ SOLN
20000.0000 [IU] | Freq: Once | INTRAMUSCULAR | Status: AC
  Administered 2020-12-25: 20000 [IU] via SUBCUTANEOUS

## 2020-12-25 NOTE — Patient Instructions (Signed)
Epoetin Alfa injection What is this medication? EPOETIN ALFA (e POE e tin AL fa) helps your body make more red blood cells. This medicine is used to treat anemia caused by chronic kidney disease, cancer chemotherapy, or HIV-therapy. It may also be used before surgery if you haveanemia. This medicine may be used for other purposes; ask your health care provider orpharmacist if you have questions. COMMON BRAND NAME(S): Epogen, Procrit, Retacrit What should I tell my care team before I take this medication? They need to know if you have any of these conditions: cancer heart disease high blood pressure history of blood clots history of stroke low levels of folate, iron, or vitamin B12 in the blood seizures an unusual or allergic reaction to erythropoietin, albumin, benzyl alcohol, hamster proteins, other medicines, foods, dyes, or preservatives pregnant or trying to get pregnant breast-feeding How should I use this medication? This medicine is for injection into a vein or under the skin. It is [place] by a health care professional in a hospital or clinic setting. If you get this medicine at home, you will be taught how to prepare and give this medicine. Use exactly as directed. Take your medicine at regularintervals. Do not take your medicine more often than directed. It is important that you put your used needles and syringes in a special sharps container. Do not put them in a trash can. If you do not have a sharpscontainer, call your pharmacist or healthcare provider to get one. A special MedGuide will be given to you by the pharmacist with eachprescription and refill. Be sure to read this information carefully each time. Talk to your pediatrician regarding the use of this medicine in children. Whilethis drug may be prescribed for selected conditions, precautions do apply. Overdosage: If you think you have taken too much of this medicine contact apoison control center or emergency room at  once. NOTE: This medicine is only for you. Do not share this medicine with others. What if I miss a dose? If you miss a dose, take it as soon as you can. If it is almost time for yournext dose, take only that dose. Do not take double or extra doses. What may interact with this medication? Interactions have not been studied. This list may not describe all possible interactions. Give your health care provider a list of all the medicines, herbs, non-prescription drugs, or dietary supplements you use. Also tell them if you smoke, drink alcohol, or use illegaldrugs. Some items may interact with your medicine. What should I watch for while using this medication? Your condition will be monitored carefully while you are receiving thismedicine. You may need blood work done while you are taking this medicine. This medicine may cause a decrease in vitamin B6. You should make sure that you get enough vitamin B6 while you are taking this medicine. Discuss the foods youeat and the vitamins you take with your health care professional. What side effects may I notice from receiving this medication? Side effects that you should report to your doctor or health care professionalas soon as possible: allergic reactions like skin rash, itching or hives, swelling of the face, lips, or tongue seizures signs and symptoms of a blood clot such as breathing problems; changes in vision; chest pain; severe, sudden headache; pain, swelling, warmth in the leg; trouble speaking; sudden numbness or weakness of the face, arm or leg signs and symptoms of a stroke like changes in vision; confusion; trouble speaking or understanding; severe headaches; sudden numbness or  changes in vision; confusion; trouble speaking or understanding; severe headaches; sudden numbness or weakness of the face, arm or leg; trouble walking; dizziness; loss of balance or coordination Side effects that usually do not require medical attention (report to your doctor or health care professional if they continue or are  bothersome): chills cough dizziness fever headaches joint pain muscle cramps muscle pain nausea, vomiting pain, redness, or irritation at site where injected This list may not describe all possible side effects. Call your doctor for medical advice about side effects. You may report side effects to FDA at 1-800-FDA-1088. Where should I keep my medication? Keep out of the reach of children. Store in a refrigerator between 2 and 8 degrees C (36 and 46 degrees F). Do not freeze or shake. Throw away any unused portion if using a single-dose vial. Multi-dose vials can be kept in the refrigerator for up to 21 days after the initial dose. Throw away unused medicine. NOTE: This sheet is a summary. It may not cover all possible information. If you have questions about this medicine, talk to your doctor, pharmacist, or health care provider.  2022 Elsevier/Gold Standard (2017-01-15 08:35:19)  

## 2020-12-26 ENCOUNTER — Ambulatory Visit (HOSPITAL_BASED_OUTPATIENT_CLINIC_OR_DEPARTMENT_OTHER): Payer: Medicare Other | Admitting: Cardiovascular Disease

## 2020-12-27 ENCOUNTER — Ambulatory Visit: Payer: Medicare Other | Admitting: Cardiovascular Disease

## 2020-12-27 NOTE — Progress Notes (Deleted)
Cardiology Office Note:   Date:  12/27/2020  NAME:  Tricia Gonzales    MRN: [id number] DOB:  12-31-1940   PCP:  Carol Ada, MD  Cardiologist:  Evalina Field, MD  Electrophysiologist:  Cristopher Peru, MD   Referring MD: Carol Ada, MD   No chief complaint on file. ***  History of Present Illness:   Tricia Gonzales is a 80 y.o. 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    Problem List 1. Paroxysmal Afib -CHADSVASC=5 -DCCV 07/31/2020 2. HFpEF 3. Junctional bradycardia s/p ppm 2016 4. DM -A1c 6.5 5. HTN 6. CKD III/IV -GFR 24 -Cr 2.0 7. HLD -T chol 151, HDL 44, LDL 84, TG 131 8. 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 Iron deficiency anemia   Past Medical History: 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   Anxiety    Arthritis    Atrial fibrillation ([place])    Avascular necrosis (HCC) hip left   leg pain also   Chronic back pain    Chronic kidney disease (CKD), stage III (moderate) (HCC)    Depression    Diverticulosis    Early cataracts, bilateral    Esophageal stricture    Gastritis    GERD (gastroesophageal reflux disease)    Glaucoma    Heart murmur     " some doctors say that I have one some say that I don"t "   Hiatal hernia    History of blood transfusion    "@ least w/1st knee OR"   History of gout    History of kidney stones    Hyperlipidemia    Hypertension    Intestinal obstruction ([place])    Kidney stones    Obesity    Osteoarthritis    Osteoporosis    Pericarditis 2016   Presence of permanent cardiac pacemaker    Sjogren's disease ([place])    Thyroid disease    Type II diabetes mellitus ([address])    Vitamin B12 deficiency     Past Surgical History: Past Surgical History:  Procedure Laterality Date   ABDOMINAL HYSTERECTOMY     complete   CARDIOVERSION N/A 07/31/2020   Procedure: CARDIOVERSION;   Surgeon: Werner Lean, MD;  Location: [facility];  Service: Cardiovascular;  Laterality: N/A;   CHOLECYSTECTOMY OPEN     COLECTOMY     for rectovaginal fistula   COLONOSCOPY  01/07/2012   Procedure: COLONOSCOPY;  Surgeon: Lafayette Dragon, MD;  Location: [facility];  Service: Endoscopy;  Laterality: N/A;   EP IMPLANTABLE DEVICE N/A 06/18/2015   Procedure: Pacemaker Implant;  Surgeon: Evans Lance, MD;  Location: [facility];  Service: Cardiovascular;  Laterality: N/A;   ESOPHAGEAL DILATION  08/17/2020   Procedure: ESOPHAGEAL DILATION;  Surgeon: Lavena Bullion, DO;  Location: [facility];  Service: Gastroenterology;;   ESOPHAGOGASTRODUODENOSCOPY N/A 11/24/2018   Procedure: ESOPHAGOGASTRODUODENOSCOPY (EGD);  Surgeon: Yetta Flock, MD;  Location: [facility];  Service: Gastroenterology;  Laterality: N/A;   ESOPHAGOGASTRODUODENOSCOPY (EGD) WITH ESOPHAGEAL DILATION     ESOPHAGOGASTRODUODENOSCOPY (EGD) WITH PROPOFOL N/A 08/12/2015   Procedure: ESOPHAGOGASTRODUODENOSCOPY (EGD) WITH PROPOFOL;  Surgeon: Manus Gunning, MD;  Location: [facility];  Service: Gastroenterology;  Laterality: N/A;   ESOPHAGOGASTRODUODENOSCOPY (EGD) WITH PROPOFOL N/A 08/17/2020   Procedure: ESOPHAGOGASTRODUODENOSCOPY (EGD) WITH PROPOFOL;  Surgeon: Gerrit Heck  V, DO;  Location: [facility];  Service: Gastroenterology;  Laterality: N/A;   EYE SURGERY Bilateral    cataracts   HOT HEMOSTASIS N/A 11/24/2018   Procedure: HOT HEMOSTASIS (ARGON PLASMA COAGULATION/BICAP);  Surgeon: Yetta Flock, MD;  Location: [facility];  Service: Gastroenterology;  Laterality: N/A;   HOT HEMOSTASIS N/A 08/17/2020   Procedure: HOT HEMOSTASIS (ARGON PLASMA COAGULATION/BICAP);  Surgeon: Lavena Bullion, DO;  Location: [facility];  Service: Gastroenterology;  Laterality: N/A;   I & D EXTREMITY Right 08/06/2016   Procedure: DEBRIDEMENT PIP RIGHT RING FINGER;  Surgeon: Daryll Brod, MD;  Location: [facility];  Service: Orthopedics;  Laterality: Right;   INSERT / REPLACE / REMOVE PACEMAKER     JOINT REPLACEMENT     KNEE ARTHROSCOPY Right    MASS EXCISION Right 08/06/2016   Procedure: EXCISION CYST;  Surgeon: Daryll Brod, MD;  Location: [facility];  Service: Orthopedics;  Laterality: Right;   REVISION TOTAL KNEE ARTHROPLASTY Left    TOTAL KNEE ARTHROPLASTY Bilateral     Current Medications: No outpatient medications have been marked as taking for the 12/27/20 encounter (Appointment) with O'Neal, Cassie Freer, MD.     Allergies:    Norvasc [amlodipine besylate], Other, Avapro [irbesartan], Glucophage [metformin hydrochloride], Lisinopril, Losartan, Morphine and related, Simvastatin, and Trulicity [dulaglutide]   Social History: Social History   Socioeconomic History   Marital status: Divorced    Spouse name: Not on file   Number of children: 2   Years of education: Not on file   Highest education level: Not on file  Occupational History   Occupation: retired  Tobacco Use   Smoking status: Former    Packs/day: 2.00    Years: 3.00    Pack years: 6.00    Types: Cigarettes    Quit date: 06/23/1983    Years since quitting: 37.5   Smokeless tobacco: Never  Vaping Use   Vaping Use: Never used  Substance and Sexual Activity   Alcohol use: Yes    Alcohol/week: 2.0 standard drinks    Types: 2 Glasses of wine per week    Comment: occ   Drug use: No   Sexual activity: Not Currently  Other Topics Concern   Not on file  Social History Narrative   Not on file   Social Determinants of Health   Financial Resource Strain: Not on file  Food Insecurity: Not on file  Transportation Needs: Not on file  Physical Activity: Not on file  Stress: Not on file  Social Connections: Not on file     Family History: The patient's ***family history includes Diabetes in her brother and mother; Hypertension in her sister. There is no history of Colon cancer, Heart attack, Stroke, Colon polyps,  Esophageal cancer, Rectal cancer, or Stomach cancer.  ROS:   All other ROS reviewed and negative. Pertinent positives noted in the HPI.     EKGs/Labs/Other Studies Reviewed:   The following studies were personally reviewed by me today:  EKG:  EKG is *** ordered today.  The ekg ordered today demonstrates ***, and was personally reviewed by me.   TTE 08/16/2020  1. Left ventricular ejection fraction, by estimation, is 50 to 55%. The  left ventricle has low normal function. The left ventricle has no regional  wall motion abnormalities.   2. Right ventricular systolic function is normal. The right ventricular  size is normal. There is moderately elevated pulmonary artery systolic  pressure.   3. The mitral valve is normal  in structure. Moderate to severe mitral  valve regurgitation. No evidence of mitral stenosis. Moderate mitral  annular calcification.   4. The aortic valve is normal in structure. Aortic valve regurgitation is  not visualized. No aortic stenosis is present.   5. The inferior vena cava is dilated in size with <50% respiratory  variability, suggesting right atrial pressure of 15 mmHg.   Recent Labs: 08/15/2020: B Natriuretic Peptide 1,245.9; TSH 5.867 08/20/2020: Magnesium 2.0 11/12/2020: ALT 9; BUN 90; Creatinine 2.08; Potassium 4.8; Sodium 135 12/25/2020: Hemoglobin 8.9; Platelet Count 199   Recent Lipid Panel No results found for: CHOL, TRIG, HDL, CHOLHDL, VLDL, LDLCALC, LDLDIRECT  Physical Exam:   VS:  There were no vitals taken for this visit.   Wt Readings from Last 3 Encounters:  11/12/20 185 lb 6.4 oz (84.1 kg)  11/01/20 179 lb 14.3 oz (81.6 kg)  10/08/20 180 lb (81.6 kg)    General: Well nourished, well developed, in no acute distress Head: Atraumatic, normal size  Eyes: PEERLA, EOMI  Neck: Supple, no JVD Endocrine: No thryomegaly Cardiac: Normal S1, S2; RRR; no murmurs, rubs, or gallops Lungs: Clear to auscultation bilaterally, no wheezing, rhonchi or  rales  Abd: Soft, nontender, no hepatomegaly  Ext: No edema, pulses 2+ Musculoskeletal: No deformities, BUE and BLE strength normal and equal Skin: Warm and dry, no rashes   Neuro: Alert and oriented to person, place, time, and situation, CNII-XII grossly intact, no focal deficits  Psych: Normal mood and affect   ASSESSMENT:   TARRIN MENN is a 80 y.o. female who presents for the following: No diagnosis found.  PLAN:   There are no diagnoses linked to this encounter.  {Are you ordering a CV Procedure (e.g. stress test, cath, DCCV, TEE, etc)?   Press F2        :[id number]}  Disposition: No follow-ups on file.  Medication Adjustments/Labs and Tests Ordered: Current medicines are reviewed at length with the patient today.  Concerns regarding medicines are outlined above.  No orders of the defined types were placed in this encounter.  No orders of the defined types were placed in this encounter.   There are no Patient Instructions on file for this visit.   Time Spent with Patient: I have spent a total of *** minutes with patient reviewing hospital notes, telemetry, EKGs, labs and examining the patient as well as establishing an assessment and plan that was discussed with the patient.  > 50% of time was spent in direct patient care.  Signed, Addison Naegeli. Audie Box, MD, [facility]  [address], [place], [place] [address] [phone]  12/27/2020 6:44 AM

## 2020-12-30 ENCOUNTER — Telehealth: Payer: Self-pay | Admitting: Cardiovascular Disease

## 2020-12-30 ENCOUNTER — Other Ambulatory Visit: Payer: Self-pay | Admitting: *Deleted

## 2020-12-30 ENCOUNTER — Inpatient Hospital Stay: Payer: Medicare Other

## 2020-12-30 ENCOUNTER — Encounter: Payer: Self-pay | Admitting: *Deleted

## 2020-12-30 ENCOUNTER — Telehealth: Payer: Self-pay | Admitting: *Deleted

## 2020-12-30 ENCOUNTER — Other Ambulatory Visit: Payer: Self-pay

## 2020-12-30 DIAGNOSIS — D631 Anemia in chronic kidney disease: Secondary | ICD-10-CM

## 2020-12-30 DIAGNOSIS — N189 Chronic kidney disease, unspecified: Secondary | ICD-10-CM | POA: Diagnosis not present

## 2020-12-30 DIAGNOSIS — N183 Chronic kidney disease, stage 3 unspecified: Secondary | ICD-10-CM

## 2020-12-30 DIAGNOSIS — D649 Anemia, unspecified: Secondary | ICD-10-CM

## 2020-12-30 LAB — CBC WITH DIFFERENTIAL (CANCER CENTER ONLY)
Abs Immature Granulocytes: 0.02 10*3/uL (ref 0.00–0.07)
Basophils Absolute: 0 10*3/uL (ref 0.0–0.1)
Basophils Relative: 0 %
Eosinophils Absolute: 0.1 10*3/uL (ref 0.0–0.5)
Eosinophils Relative: 1 %
HCT: 29.2 % — ABNORMAL LOW (ref 36.0–46.0)
Hemoglobin: 8.8 g/dL — ABNORMAL LOW (ref 12.0–15.0)
Immature Granulocytes: 0 %
Lymphocytes Relative: 6 %
Lymphs Abs: 0.4 10*3/uL — ABNORMAL LOW (ref 0.7–4.0)
MCH: 30.1 pg (ref 26.0–34.0)
MCHC: 30.1 g/dL (ref 30.0–36.0)
MCV: 100 fL (ref 80.0–100.0)
Monocytes Absolute: 0.5 10*3/uL (ref 0.1–1.0)
Monocytes Relative: 7 %
Neutro Abs: 5.7 10*3/uL (ref 1.7–7.7)
Neutrophils Relative %: 86 %
Platelet Count: 224 10*3/uL (ref 150–400)
RBC: 2.92 MIL/uL — ABNORMAL LOW (ref 3.87–5.11)
RDW: 18.4 % — ABNORMAL HIGH (ref 11.5–15.5)
WBC Count: 6.6 10*3/uL (ref 4.0–10.5)
nRBC: 0 % (ref 0.0–0.2)

## 2020-12-30 LAB — SAMPLE TO BLOOD BANK

## 2020-12-30 NOTE — Progress Notes (Signed)
Reviewed CBC results w/patient--stable. No  need for transfusion. F/U as scheduled.

## 2020-12-30 NOTE — Telephone Encounter (Signed)
Patient states she has recently become very weak and fatigued. She states she is constantly sleeping. This past weekend she states she slept most of the weekend and she is extremely concerned. Please return call to discuss further.

## 2020-12-30 NOTE — Telephone Encounter (Signed)
Returned call to patient-patient reports experiencing extreme fatigue and weakness since ~July 4th, worse over the weekend.   She states she slept all weekend.  Reports episode with her tooth, states she "poured" blood from 2:30 AM-8 AM and had to go to dentist the next day to stop it.   Reports recent labs showed drop in her hmg 8.9 (from 11).   Denies cp, SOB, swelling, fever, cough.  Covid test negative x 2.   She has called hematology and is awaiting a call back as she thinks her drop in hmg may be causing her symptoms.     Routed to MD to make aware.

## 2020-12-30 NOTE — Telephone Encounter (Signed)
Patient returning call.

## 2020-12-30 NOTE — Telephone Encounter (Signed)
Pt called c/o SOB and weakness due to blood loss Thursday night. She stated, "I has dental work 2 weeks ago but my mouth started gushing blood 2 weeks later." Per Dr.Sherrill, made pt lab appt for lab draw. Lab appt made, called pt to confirm. Pt verbalized understanding.

## 2020-12-30 NOTE — Telephone Encounter (Signed)
Patient aware of recommendations, appt scheduled with Dr. Audie Box 7/12 at 11 AM.

## 2020-12-30 NOTE — Telephone Encounter (Signed)
Left message to call back   Tell her to hold her eliquis. She missed her appointment with me last week. Have her follow-up asap.   [place] T. Audie Box, MD, [place]  [address] [place]  [place], [place] [address]  [phone]  1:13 PM

## 2020-12-31 ENCOUNTER — Ambulatory Visit (INDEPENDENT_AMBULATORY_CARE_PROVIDER_SITE_OTHER): Payer: Medicare Other | Admitting: Cardiovascular Disease

## 2020-12-31 ENCOUNTER — Encounter: Payer: Self-pay | Admitting: Cardiovascular Disease

## 2020-12-31 VITALS — BP 138/74 | HR 62 | Ht <= 58 in | Wt 178.0 lb

## 2020-12-31 DIAGNOSIS — I10 Essential (primary) hypertension: Secondary | ICD-10-CM | POA: Diagnosis not present

## 2020-12-31 DIAGNOSIS — I5032 Chronic diastolic (congestive) heart failure: Secondary | ICD-10-CM | POA: Diagnosis not present

## 2020-12-31 DIAGNOSIS — I495 Sick sinus syndrome: Secondary | ICD-10-CM

## 2020-12-31 DIAGNOSIS — Z950 Presence of cardiac pacemaker: Secondary | ICD-10-CM

## 2020-12-31 DIAGNOSIS — I480 Paroxysmal atrial fibrillation: Secondary | ICD-10-CM

## 2020-12-31 DIAGNOSIS — I442 Atrioventricular block, complete: Secondary | ICD-10-CM

## 2020-12-31 DIAGNOSIS — Z7901 Long term (current) use of anticoagulants: Secondary | ICD-10-CM

## 2020-12-31 MED ORDER — AMIODARONE HCL 100 MG PO TABS: 100.0000 mg | ORAL_TABLET | Freq: Every day | ORAL | 1 refills | Status: DC

## 2020-12-31 NOTE — Progress Notes (Signed)
Cardiology Office Note:   Date:  12/31/2020  NAME:  Tricia Gonzales    MRN: [id number] DOB:  [birth year]   PCP:  Carol Ada, MD  Cardiologist:  Evalina Field, MD  Electrophysiologist:  Cristopher Peru, MD   Referring MD: Carol Ada, MD   Chief Complaint  Patient presents with   Follow-up    History of Present Illness:   Tricia Gonzales is a 80 y.o. female with a hx of HFpEF, anemia secondary to GI bleed, diabetes, hypertension, CKD stage III/IV, junctional bradycardia status post pacemaker implantation who presents for follow-up.  She was evaluated in the hospital in March for new onset anemia and heart failure.  Found to have GI bleed.  Anemia continues.  She was instructed yesterday to hold her Eliquis. She reports that her hemoglobin value is actually not that far off her baseline.  She reports that her shortness of breath really has not improved.  Weights are stable.  She is down to 178 pounds.  She has steadily lost weight since her hospitalization in March.  She is taking torsemide 40 mg daily.  Kidney function is stable.  Her EKG shows she is maintaining sinus rhythm.  She has requested to come off amiodarone.  She reports some nausea after taking the medication.  We discussed dropping the dose.  I think this is her only option for rhythm control.  She is okay to decrease this to 100 mg daily.  Given stability in her hemoglobin value we will continue with her Eliquis.  She will see her kidney doctor later this month.  Volume status seems to be acceptable.   Problem List 1. Paroxysmal Afib -CHADSVASC=5 -DCCV 07/31/2020 2. HFpEF 3. Junctional bradycardia s/p ppm 2016 4. DM -A1c 6.5 5. HTN 6. 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 HLD -T chol 151, HDL 44, LDL 84, TG 131 8. 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    Past Medical History: 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   Anxiety    Arthritis    Atrial fibrillation (HCC)    Avascular  necrosis (HCC) hip left   leg pain also   Chronic back pain    Chronic kidney disease (CKD), stage III (moderate) (HCC)    Depression    Diverticulosis    Early cataracts, bilateral    Esophageal stricture    Gastritis    GERD (gastroesophageal reflux disease)    Glaucoma    Heart murmur     " some doctors say that I have one some say that I don"t "   Hiatal hernia    History of blood transfusion    "@ least w/1st knee OR"   History of gout    History of kidney stones    Hyperlipidemia    Hypertension    Intestinal obstruction ([place])    Kidney stones    Obesity    Osteoarthritis    Osteoporosis    Pericarditis 2016   Presence of permanent cardiac pacemaker    Sjogren's disease ([place])    Thyroid disease    Type II diabetes mellitus ([place])    Vitamin B12 deficiency     Past Surgical History: Past Surgical History:  Procedure Laterality Date   ABDOMINAL HYSTERECTOMY     complete   CARDIOVERSION N/A 07/31/2020   Procedure: CARDIOVERSION;  Surgeon: Werner Lean, MD;  Location: [facility];  Service: Cardiovascular;  Laterality: N/A;  CHOLECYSTECTOMY OPEN     COLECTOMY     for rectovaginal fistula   COLONOSCOPY  01/07/2012   Procedure: COLONOSCOPY;  Surgeon: Lafayette Dragon, MD;  Location: [facility];  Service: Endoscopy;  Laterality: N/A;   EP IMPLANTABLE DEVICE N/A 06/18/2015   Procedure: Pacemaker Implant;  Surgeon: Evans Lance, MD;  Location: [place];  Service: Cardiovascular;  Laterality: N/A;   ESOPHAGEAL DILATION  08/17/2020   Procedure: ESOPHAGEAL DILATION;  Surgeon: Lavena Bullion, DO;  Location: [facility];  Service: Gastroenterology;;   ESOPHAGOGASTRODUODENOSCOPY N/A 11/24/2018   Procedure: ESOPHAGOGASTRODUODENOSCOPY (EGD);  Surgeon: Yetta Flock, MD;  Location: [facility];  Service: Gastroenterology;  Laterality: N/A;   ESOPHAGOGASTRODUODENOSCOPY (EGD) WITH ESOPHAGEAL DILATION     ESOPHAGOGASTRODUODENOSCOPY (EGD) WITH  PROPOFOL N/A 08/12/2015   Procedure: ESOPHAGOGASTRODUODENOSCOPY (EGD) WITH PROPOFOL;  Surgeon: Manus Gunning, MD;  Location: [facility];  Service: Gastroenterology;  Laterality: N/A;   ESOPHAGOGASTRODUODENOSCOPY (EGD) WITH PROPOFOL N/A 08/17/2020   Procedure: ESOPHAGOGASTRODUODENOSCOPY (EGD) WITH PROPOFOL;  Surgeon: Lavena Bullion, DO;  Location: [facility];  Service: Gastroenterology;  Laterality: N/A;   EYE SURGERY Bilateral    cataracts   HOT HEMOSTASIS N/A 11/24/2018   Procedure: HOT HEMOSTASIS (ARGON PLASMA COAGULATION/BICAP);  Surgeon: Yetta Flock, MD;  Location: [facility];  Service: Gastroenterology;  Laterality: N/A;   HOT HEMOSTASIS N/A 08/17/2020   Procedure: HOT HEMOSTASIS (ARGON PLASMA COAGULATION/BICAP);  Surgeon: Lavena Bullion, DO;  Location: [facility] ENDOSCOPY;  Service: Gastroenterology;  Laterality: N/A;   I & D EXTREMITY Right 08/06/2016   Procedure: DEBRIDEMENT PIP RIGHT RING FINGER;  Surgeon: Daryll Brod, MD;  Location: [facility];  Service: Orthopedics;  Laterality: Right;   INSERT / REPLACE / REMOVE PACEMAKER     JOINT REPLACEMENT     KNEE ARTHROSCOPY Right    MASS EXCISION Right 08/06/2016   Procedure: EXCISION CYST;  Surgeon: Daryll Brod, MD;  Location: [facility];  Service: Orthopedics;  Laterality: Right;   REVISION TOTAL KNEE ARTHROPLASTY Left    TOTAL KNEE ARTHROPLASTY Bilateral     Current Medications: Current Meds  Medication Sig   ACCU-CHEK AVIVA PLUS test strip 1 each by Other route daily.    acetaminophen (TYLENOL) 650 MG CR tablet Take 1,300 mg by mouth every evening.   BD PEN NEEDLE NANO U/F 32G X 4 MM MISC See admin instructions.   brimonidine (ALPHAGAN) 0.2 % ophthalmic solution Place 1 drop into the right eye 2 (two) times daily.   carvedilol (COREG) 3.125 MG tablet TAKE TWO TABLETS IN THE MORNING AND TAKEONE TABLET IN THE EVENING (Patient taking differently: Take 3.125-6.25 mg by mouth 2 (two) times daily with a meal. 6.25 mg in the  morning and 3.125 mg in the evening.)   Cholecalciferol (VITAMIN D3) 25 MCG (1000 UT) CAPS Take 1,000 Units by mouth daily.   cycloSPORINE (RESTASIS) 0.05 % ophthalmic emulsion Place 1 drop into both eyes 2 (two) times daily.   ELIQUIS 5 MG TABS tablet TAKE ONE TABLET TWICE DAILY (Patient taking differently: Take 5 mg by mouth 2 (two) times daily.)   febuxostat (ULORIC) 40 MG tablet Take 1 tablet (40 mg total) by mouth every Monday, Wednesday, and Friday.   ferrous sulfate 325 (65 FE) MG EC tablet Take 1 tablet (325 mg total) by mouth 2 (two) times daily. (Patient taking differently: Take 325 mg by mouth daily with breakfast.)   gabapentin (NEURONTIN) 300 MG capsule Take 1 capsule (300 mg total) by mouth daily.  gabapentin (NEURONTIN) 400 MG capsule Take 1 capsule (400 mg total) by mouth at bedtime.   JANUVIA 25 MG tablet Take 25 mg by mouth daily.   levothyroxine (SYNTHROID) 50 MCG tablet Take 1 tablet (50 mcg total) by mouth at bedtime.   lidocaine (LIDODERM) 5 % Place 1-3 patches onto the skin See admin instructions. Apply 1-3 patches transdermally every 24 hours as needed for pain and remove & discard patches within 12 hours or as directed by MD   montelukast (SINGULAIR) 10 MG tablet Take 1 tablet (10 mg total) by mouth at bedtime.   nystatin (NYAMYC) powder APPLY TOPICALLY TO LEFT ABDOMINAL FOLD AND GROIN TWICE A DAY (Patient taking differently: Apply 1 application topically See admin instructions. Abdominal fold and groin bid prn rash)   pantoprazole (PROTONIX) 40 MG tablet Take 40 mg by mouth daily.   pentoxifylline (TRENTAL) 400 MG CR tablet TAKE ONE TABLET EACH DAY WITH A MEAL   Polyethylene Glycol 3350 (MIRALAX PO) Take 17 g by mouth daily.   promethazine (PHENERGAN) 12.5 MG tablet Take 1 tablet (12.5 mg total) by mouth every 8 (eight) hours as needed for nausea or vomiting.   rOPINIRole (REQUIP) 0.5 MG tablet Take 0.5 mg by mouth at bedtime.   rosuvastatin (CRESTOR) 5 MG tablet Take 1  tablet (5 mg total) by mouth daily. (Patient taking differently: Take 5 mg by mouth at bedtime.)   sennosides-docusate sodium (SENOKOT-S) 8.6-50 MG tablet 1 tablet   torsemide (DEMADEX) 20 MG tablet torsemide 20 mg tablet   Torsemide 40 MG TABS Take 40 mg by mouth as needed (fluid).   traMADol (ULTRAM) 50 MG tablet Take 50 mg by mouth See admin instructions. Take 50 mg by mouth at bedtime and an additional 50 mg once a day as needed for pain   TRESIBA FLEXTOUCH 100 UNIT/ML FlexTouch Pen Inject 10 Units into the skin every evening.   [DISCONTINUED] amiodarone (PACERONE) 200 MG tablet Take 1 tablet (200 mg total) by mouth daily.     Allergies:    Norvasc [amlodipine besylate], Other, Avapro [irbesartan], Glucophage [metformin hydrochloride], Lisinopril, Losartan, Morphine and related, Simvastatin, and Trulicity [dulaglutide]   Social History: Social History   Socioeconomic History   Marital status: Divorced    Spouse name: Not on file   Number of children: 2   Years of education: Not on file   Highest education level: Not on file  Occupational History   Occupation: retired  Tobacco Use   Smoking status: Former    Packs/day: 2.00    Years: 3.00    Pack years: 6.00    Types: Cigarettes    Quit date: 06/23/1983    Years since quitting: 37.5   Smokeless tobacco: Never  Vaping Use   Vaping Use: Never used  Substance and Sexual Activity   Alcohol use: Yes    Alcohol/week: 2.0 standard drinks    Types: 2 Glasses of wine per week    Comment: occ   Drug use: No   Sexual activity: Not Currently  Other Topics Concern   Not on file  Social History Narrative   Not on file   Social Determinants of Health   Financial Resource Strain: Not on file  Food Insecurity: Not on file  Transportation Needs: Not on file  Physical Activity: Not on file  Stress: Not on file  Social Connections: Not on file     Family History: The patient's family history includes Diabetes in her brother and  mother; Hypertension  in her sister. There is no history of Colon cancer, Heart attack, Stroke, Colon polyps, Esophageal cancer, Rectal cancer, or Stomach cancer.  ROS:   All other ROS reviewed and negative. Pertinent positives noted in the HPI.     EKGs/Labs/Other Studies Reviewed:   The following studies were personally reviewed by me today:  EKG:  EKG is ordered today.  The ekg ordered today demonstrates AV paced rhythm, and was personally reviewed by me.   TTE 08/16/2020    1. Left ventricular ejection fraction, by estimation, is 50 to 55%. The  left ventricle has low normal function. The left ventricle has no regional  wall motion abnormalities.   2. Right ventricular systolic function is normal. The right ventricular  size is normal. There is moderately elevated pulmonary artery systolic  pressure.   3. The mitral valve is normal in structure. Moderate to severe mitral  valve regurgitation. No evidence of mitral stenosis. Moderate mitral  annular calcification.   4. The aortic valve is normal in structure. Aortic valve regurgitation is  not visualized. No aortic stenosis is present.   5. The inferior vena cava is dilated in size with <50% respiratory  variability, suggesting right atrial pressure of 15 mmHg.   Recent Labs: 08/15/2020: B Natriuretic Peptide 1,245.9; TSH 5.867 08/20/2020: Magnesium 2.0 11/12/2020: ALT 9; BUN 90; Creatinine 2.08; Potassium 4.8; Sodium 135 12/30/2020: Hemoglobin 8.8; Platelet Count 224   Recent Lipid Panel No results found for: CHOL, TRIG, HDL, CHOLHDL, VLDL, LDLCALC, LDLDIRECT  Physical Exam:   VS:  BP 138/74   Pulse 62   Ht _0  (1.473 m)   Wt 178 lb (80.7 kg)   SpO2 95%   BMI 37.20 kg/m    Wt Readings from Last 3 Encounters:  12/31/20 178 lb (80.7 kg)  11/12/20 185 lb 6.4 oz (84.1 kg)  11/01/20 179 lb 14.3 oz (81.6 kg)    General: Well nourished, well developed, in no acute distress Head: Atraumatic, normal size  Eyes: PEERLA, EOMI   Neck: Supple, no JVD Endocrine: No thryomegaly Cardiac: Normal S1, S2; RRR; no murmurs, rubs, or gallops Lungs: Clear to auscultation bilaterally, no wheezing, rhonchi or rales  Abd: Soft, nontender, no hepatomegaly  Ext: Trace edema Musculoskeletal: No deformities, BUE and BLE strength normal and equal Skin: Warm and dry, no rashes   Neuro: Alert and oriented to person, place, time, and situation, CNII-XII grossly intact, no focal deficits  Psych: Normal mood and affect   ASSESSMENT:   Tricia Gonzales is a 80 y.o. female who presents for the following: 1. Complete heart block ([place])   2. Sinus node dysfunction (HCC)   3. Pacemaker   4. Chronic diastolic heart failure (HCC)   5. Paroxysmal atrial fibrillation ([place])   6. Adequate anticoagulation on anticoagulant therapy   7. Primary hypertension     PLAN:   1. Complete heart block ([place]) 2. Sinus node dysfunction (HCC) 3. Pacemaker -Recent interrogation shows normal device function.  She is doing well.  4. Chronic diastolic heart failure ([place]) -Weights continued to decline.  She is euvolemic or close to it.  We will continue torsemide 40 mg daily.  BP well controlled.  5. Paroxysmal atrial fibrillation (HCC) 6. Adequate anticoagulation on anticoagulant therapy -Recurrent atrial fibrillation status post failed cardioversions.  We have settled on rhythm control medications with amiodarone.  She does report some nausea with it.  We will reduce to 100 mg daily.  If symptoms persist she should take it  at night.  She has limited options for antirhythm medicines.  Due to her significant CKD I think she would not be a great candidate for Tikosyn or other agents.  She cannot be on 1C agents either.  We will continue with amiodarone for now. -Continue Eliquis 5 mg twice daily.  She is not bleeding.  Her anemia is chronic.  7. Primary hypertension -Well-controlled.  No change to medications.  Disposition: Return in about 6 months (around  07/03/2021).  Medication Adjustments/Labs and Tests Ordered: Current medicines are reviewed at length with the patient today.  Concerns regarding medicines are outlined above.  Orders Placed This Encounter  Procedures   EKG 12-Lead    Meds ordered this encounter  Medications   amiodarone (PACERONE) 100 MG tablet    Sig: Take 1 tablet (100 mg total) by mouth daily.    Dispense:  90 tablet    Refill:  1     Patient Instructions  Medication Instructions:  Decrease Amiodarone to 100 mg daily   *If you need a refill on your cardiac medications before your next appointment, please call your pharmacy*   Follow-Up: At [facility], you and your health needs are our priority.  As part of our continuing mission to provide you with exceptional heart care, we have created designated Provider Care Teams.  These Care Teams include your primary Cardiologist (physician) and Advanced Practice Providers (APPs -  Physician Assistants and Nurse Practitioners) who all work together to provide you with the care you need, when you need it.  We recommend signing up for the patient portal called "MyChart".  Sign up information is provided on this After Visit Summary.  MyChart is used to connect with patients for Virtual Visits (Telemedicine).  Patients are able to view lab/test results, encounter notes, upcoming appointments, etc.  Non-urgent messages can be sent to your provider as well.   To learn more about what you can do with MyChart, go to [email or web address].    Your next appointment:   6 month(s)  The format for your next appointment:   In Person  Provider:   Eleonore Chiquito, MD      Time Spent with Patient: I have spent a total of 35 minutes with patient reviewing hospital notes, telemetry, EKGs, labs and examining the patient as well as establishing an assessment and plan that was discussed with the patient.  > 50% of time was spent in direct patient care.  Signed, Addison Naegeli. Audie Box,  MD, [facility]  [address], [place],  [address] [phone]  12/31/2020 12:09 PM

## 2020-12-31 NOTE — Patient Instructions (Signed)
Medication Instructions:  Decrease Amiodarone to 100 mg daily   *If you need a refill on your cardiac medications before your next appointment, please call your pharmacy*   Follow-Up: At [facility], you and your health needs are our priority.  As part of our continuing mission to provide you with exceptional heart care, we have created designated Provider Care Teams.  These Care Teams include your primary Cardiologist (physician) and Advanced Practice Providers (APPs -  Physician Assistants and Nurse Practitioners) who all work together to provide you with the care you need, when you need it.  We recommend signing up for the patient portal called "MyChart".  Sign up information is provided on this After Visit Summary.  MyChart is used to connect with patients for Virtual Visits (Telemedicine).  Patients are able to view lab/test results, encounter notes, upcoming appointments, etc.  Non-urgent messages can be sent to your provider as well.   To learn more about what you can do with MyChart, go to [email or web address].    Your next appointment:   6 month(s)  The format for your next appointment:   In Person  Provider:   Eleonore Chiquito, MD

## 2021-01-01 NOTE — Telephone Encounter (Signed)
Patient seen in office yesterday 07/12

## 2021-01-03 ENCOUNTER — Ambulatory Visit (INDEPENDENT_AMBULATORY_CARE_PROVIDER_SITE_OTHER): Payer: Medicare Other

## 2021-01-03 DIAGNOSIS — I442 Atrioventricular block, complete: Secondary | ICD-10-CM

## 2021-01-06 LAB — CUP PACEART REMOTE DEVICE CHECK
Battery Remaining Longevity: 30 mo
Battery Voltage: 2.97 V
Brady Statistic AP VP Percent: 99.85 %
Brady Statistic AP VS Percent: 0 %
Brady Statistic AS VP Percent: 0.15 %
Brady Statistic AS VS Percent: 0 %
Brady Statistic RA Percent Paced: 99.85 %
Brady Statistic RV Percent Paced: 99.99 %
Date Time Interrogation Session: 20220718120902
Implantable Lead Implant Date: 20161227
Implantable Lead Implant Date: 20161227
Implantable Lead Location: 753859
Implantable Lead Location: 753860
Implantable Lead Model: 5076
Implantable Lead Model: 5076
Implantable Pulse Generator Implant Date: 20161227
Lead Channel Impedance Value: 285 Ohm
Lead Channel Impedance Value: 285 Ohm
Lead Channel Impedance Value: 342 Ohm
Lead Channel Impedance Value: 380 Ohm
Lead Channel Pacing Threshold Amplitude: 0.75 V
Lead Channel Pacing Threshold Amplitude: 1.375 V
Lead Channel Pacing Threshold Pulse Width: 0.4 ms
Lead Channel Pacing Threshold Pulse Width: 0.4 ms
Lead Channel Sensing Intrinsic Amplitude: 0.75 mV
Lead Channel Sensing Intrinsic Amplitude: 0.75 mV
Lead Channel Sensing Intrinsic Amplitude: 19.375 mV
Lead Channel Sensing Intrinsic Amplitude: 19.375 mV
Lead Channel Setting Pacing Amplitude: 2 V
Lead Channel Setting Pacing Amplitude: 2.75 V
Lead Channel Setting Pacing Pulse Width: 0.4 ms
Lead Channel Setting Sensing Sensitivity: 2.8 mV

## 2021-01-14 ENCOUNTER — Inpatient Hospital Stay: Payer: Medicare Other | Admitting: Nurse Practitioner

## 2021-01-14 ENCOUNTER — Encounter: Payer: Self-pay | Admitting: Nurse Practitioner

## 2021-01-14 ENCOUNTER — Inpatient Hospital Stay: Payer: Medicare Other

## 2021-01-14 ENCOUNTER — Encounter (INDEPENDENT_AMBULATORY_CARE_PROVIDER_SITE_OTHER): Payer: Self-pay

## 2021-01-14 ENCOUNTER — Inpatient Hospital Stay (HOSPITAL_BASED_OUTPATIENT_CLINIC_OR_DEPARTMENT_OTHER): Payer: Medicare Other | Admitting: Nurse Practitioner

## 2021-01-14 ENCOUNTER — Other Ambulatory Visit: Payer: Self-pay

## 2021-01-14 VITALS — BP 154/82 | HR 75 | Temp 97.3°F | Resp 18 | Wt 186.6 lb

## 2021-01-14 DIAGNOSIS — D631 Anemia in chronic kidney disease: Secondary | ICD-10-CM | POA: Diagnosis not present

## 2021-01-14 DIAGNOSIS — N189 Chronic kidney disease, unspecified: Secondary | ICD-10-CM

## 2021-01-14 DIAGNOSIS — N183 Chronic kidney disease, stage 3 unspecified: Secondary | ICD-10-CM

## 2021-01-14 LAB — CBC WITH DIFFERENTIAL (CANCER CENTER ONLY)
Abs Immature Granulocytes: 0.02 10*3/uL (ref 0.00–0.07)
Basophils Absolute: 0 10*3/uL (ref 0.0–0.1)
Basophils Relative: 0 %
Eosinophils Absolute: 0 10*3/uL (ref 0.0–0.5)
Eosinophils Relative: 1 %
HCT: 32.4 % — ABNORMAL LOW (ref 36.0–46.0)
Hemoglobin: 9.8 g/dL — ABNORMAL LOW (ref 12.0–15.0)
Immature Granulocytes: 0 %
Lymphocytes Relative: 4 %
Lymphs Abs: 0.3 10*3/uL — ABNORMAL LOW (ref 0.7–4.0)
MCH: 30.8 pg (ref 26.0–34.0)
MCHC: 30.2 g/dL (ref 30.0–36.0)
MCV: 101.9 fL — ABNORMAL HIGH (ref 80.0–100.0)
Monocytes Absolute: 0.4 10*3/uL (ref 0.1–1.0)
Monocytes Relative: 6 %
Neutro Abs: 5.7 10*3/uL (ref 1.7–7.7)
Neutrophils Relative %: 89 %
Platelet Count: 205 10*3/uL (ref 150–400)
RBC: 3.18 MIL/uL — ABNORMAL LOW (ref 3.87–5.11)
RDW: 17.6 % — ABNORMAL HIGH (ref 11.5–15.5)
WBC Count: 6.4 10*3/uL (ref 4.0–10.5)
nRBC: 0 % (ref 0.0–0.2)

## 2021-01-14 MED ORDER — EPOETIN ALFA-EPBX 10000 UNIT/ML IJ SOLN
20000.0000 [IU] | Freq: Once | INTRAMUSCULAR | Status: AC
  Administered 2021-01-14: 20000 [IU] via SUBCUTANEOUS

## 2021-01-14 MED ORDER — EPOETIN ALFA-EPBX 40000 UNIT/ML IJ SOLN
40000.0000 [IU] | Freq: Once | INTRAMUSCULAR | Status: AC
  Administered 2021-01-14: 40000 [IU] via SUBCUTANEOUS

## 2021-01-14 NOTE — Patient Instructions (Signed)
Epoetin Alfa injection What is this medication? EPOETIN ALFA (e POE e tin AL fa) helps your body make more red blood cells. This medicine is used to treat anemia caused by chronic kidney disease, cancer chemotherapy, or HIV-therapy. It may also be used before surgery if you haveanemia. This medicine may be used for other purposes; ask your health care provider orpharmacist if you have questions. COMMON BRAND NAME(S): Epogen, Procrit, Retacrit What should I tell my care team before I take this medication? They need to know if you have any of these conditions: cancer heart disease high blood pressure history of blood clots history of stroke low levels of folate, iron, or vitamin B12 in the blood seizures an unusual or allergic reaction to erythropoietin, albumin, benzyl alcohol, hamster proteins, other medicines, foods, dyes, or preservatives pregnant or trying to get pregnant breast-feeding How should I use this medication? This medicine is for injection into a vein or under the skin. It is [place] by a health care professional in a hospital or clinic setting. If you get this medicine at home, you will be taught how to prepare and give this medicine. Use exactly as directed. Take your medicine at regularintervals. Do not take your medicine more often than directed. It is important that you put your used needles and syringes in a special sharps container. Do not put them in a trash can. If you do not have a sharpscontainer, call your pharmacist or healthcare provider to get one. A special MedGuide will be given to you by the pharmacist with eachprescription and refill. Be sure to read this information carefully each time. Talk to your pediatrician regarding the use of this medicine in children. Whilethis drug may be prescribed for selected conditions, precautions do apply. Overdosage: If you think you have taken too much of this medicine contact apoison control center or emergency room at  once. NOTE: This medicine is only for you. Do not share this medicine with others. What if I miss a dose? If you miss a dose, take it as soon as you can. If it is almost time for yournext dose, take only that dose. Do not take double or extra doses. What may interact with this medication? Interactions have not been studied. This list may not describe all possible interactions. Give your health care provider a list of all the medicines, herbs, non-prescription drugs, or dietary supplements you use. Also tell them if you smoke, drink alcohol, or use illegaldrugs. Some items may interact with your medicine. What should I watch for while using this medication? Your condition will be monitored carefully while you are receiving thismedicine. You may need blood work done while you are taking this medicine. This medicine may cause a decrease in vitamin B6. You should make sure that you get enough vitamin B6 while you are taking this medicine. Discuss the foods youeat and the vitamins you take with your health care professional. What side effects may I notice from receiving this medication? Side effects that you should report to your doctor or health care professionalas soon as possible: allergic reactions like skin rash, itching or hives, swelling of the face, lips, or tongue seizures signs and symptoms of a blood clot such as breathing problems; changes in vision; chest pain; severe, sudden headache; pain, swelling, warmth in the leg; trouble speaking; sudden numbness or weakness of the face, arm or leg signs and symptoms of a stroke like changes in vision; confusion; trouble speaking or understanding; severe headaches; sudden numbness or  changes in vision; confusion; trouble speaking or understanding; severe headaches; sudden numbness or weakness of the face, arm or leg; trouble walking; dizziness; loss of balance or coordination Side effects that usually do not require medical attention (report to your doctor or health care professional if they continue or are  bothersome): chills cough dizziness fever headaches joint pain muscle cramps muscle pain nausea, vomiting pain, redness, or irritation at site where injected This list may not describe all possible side effects. Call your doctor for medical advice about side effects. You may report side effects to FDA at 1-800-FDA-1088. Where should I keep my medication? Keep out of the reach of children. Store in a refrigerator between 2 and 8 degrees C (36 and 46 degrees F). Do not freeze or shake. Throw away any unused portion if using a single-dose vial. Multi-dose vials can be kept in the refrigerator for up to 21 days after the initial dose. Throw away unused medicine. NOTE: This sheet is a summary. It may not cover all possible information. If you have questions about this medicine, talk to your doctor, pharmacist, or health care provider.  2022 Elsevier/Gold Standard (2017-01-15 08:35:19)  

## 2021-01-14 NOTE — Progress Notes (Signed)
[place] OFFICE PROGRESS NOTE   Diagnosis: Anemia, chronic GI bleeding, chronic renal failure  INTERVAL HISTORY:   Ms. Oki returns as scheduled.  She continues erythropoietin.interval adjusted to every 3 weeks following last office visit.  Most recent injection on 12/25/2020.  Overall she feels good.  She continues to have intermittent dyspnea.  She thinks the hot weather contributes to this.  She denies bleeding.  She continues 1 iron tablet daily.  Objective:  Vital signs in last 24 hours:  Blood pressure (!) 154/82, pulse 75, temperature (!) 97.3 F (36.3 C), temperature source Temporal, resp. rate 18, weight 186 lb 9.6 oz (84.6 kg), SpO2 96 %.    Resp: Lungs clear bilaterally. Cardio: Regular rate and rhythm. GI: Abdomen soft and nontender.  No hepatosplenomegaly. Vascular: Trace edema lower leg bilaterally, right greater than left (chronic per patient report).   Lab Results:  Lab Results  Component Value Date   WBC 6.4 01/14/2021   HGB 9.8 (L) 01/14/2021   HCT 32.4 (L) 01/14/2021   MCV 101.9 (H) 01/14/2021   PLT 205 01/14/2021   NEUTROABS 5.7 01/14/2021    Imaging:  No results found.  Medications: I have reviewed the patient's current medications.  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; quantitative immunoglobulins show IgG mildly decreased 654 (range [phone]), IgA and IgM in normal range; polyclonal serum light chain elevation 11/15/2015-hemoglobin 11.2 11/29/2015-hemoglobin 11.1 12/12/2005-hemoglobin  10.4; Aranesp 100 g every 2 weeks initiated 02/07/2016-hemoglobin 11.0, Aranesp held, changed to an every 3 week schedule Progressive anemia and decreased iron stores December 2017, treated with IV iron January 2018 with improvement She did not require Aranesp for 2 months and resumed Aranesp on a monthly schedule for 08/2016 Aranesp every 6 weeks beginning 05/04/2017 Progressive anemia and decreased iron stores 07/05/2017; IV iron  given 07/28/2017 and 08/06/2017 Persistent severe anemia, Aranesp change to every 2 weeks beginning 10/19/2017 Persistent anemia, decreased iron stores 11/30/2017 IV iron 12/13/2017, 12/20/2017, Decreased iron stores 05/17/2018  IV iron 06/24/2018 07/05/2018- no serum M spike, polyclonal serum light chain elevation, IgG mildly decreased with IgA and IgM within normal range  2 units of blood 08/17/2018 IV iron 08/17/2018 and 08/24/2018 Stool heme +08/30/2018, referred to GI 2 units of blood 09/01/2018 1 unit of blood 10/25/2018 11/11/2018 colonoscopy- diverticulosis in the entire examined colon.  End-to-end colocolonic anastomosis.  One 5 mm polyp in the ascending colon.  No cause for anemia. 11/11/2018 upper endoscopy- evidence of gastric antral vascular ectasia which is the likely cause of the patient's worsening anemia. 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 Hemoglobin stabilized following treatment of the Gave Every 6-week erythropoietin 06/22/2019, erythropoietin dose increased to 60,000 units 09/14/2019 Erythropoietin 60,000 units every 3 weeks 10/24/2019 Schedule changed to every 4 weeks 12/15/2019 IV iron 02/21/2020 and 02/29/2020, 06/25/2020, 07/03/2020 Erythropoietin adjusted to every 2 weeks beginning 05/23/2020 Erythropoietin changed to every 3 weeks beginning 11/12/2020 Erythropoietin continued every 3 weeks 01/14/2021   2.   Chronic renal insufficiency   3.    Atrial fibrillation-maintained on  apixaban   4.    Osteoarthritis   5.     Diabetes  6.     Sjogren's disease   7.     Pericarditis February 2017   8.     Colonoscopy 01/07/2012-prior segmental colectomy in the sigmoid colon; moderate diverticulosis throughout the colon; otherwise normal examination  Disposition: Ms. Sieben remains stable from a hematologic standpoint.  Review of the CBC from today shows hemoglobin at goal range.  Plan to continue erythropoietin every 3 weeks.  We will see her in follow-up in 9 weeks.   Ned Card ANP/GNP-BC   01/14/2021  11:13 AM

## 2021-01-24 ENCOUNTER — Telehealth: Payer: Self-pay | Admitting: Cardiovascular Disease

## 2021-01-24 NOTE — Telephone Encounter (Signed)
Patient called to talk with Dr. Audie Box or nurse

## 2021-01-24 NOTE — Telephone Encounter (Signed)
Spoke with pt, aware of the recommendations. She will let us know if she is still having problems after 5 days.

## 2021-01-24 NOTE — Telephone Encounter (Signed)
Spoke with the patient who states that she saw her PCP on 8/3 due to fatigue and SOB. She states that her O2 with exercise was getting below 90%. She reports that PCP did lab work and BNP was 2,168. She states that Dr. Tamala Julian has faxed over the lab work to the office. She reports that she does have some swelling in her right leg. She is unsure about swelling in her left leg. She reports she is up a couple pounds but not sure of exact weight gain. Advised patient that she can take extra 40 mg of torsemide. Patient reports that her nephrologist changed her torsemide and she is taking 20 mg every other day alternating with 40 mg every other day. Advised patient on elevating legs and avoiding salt. Will route to Dr. Audie Box for further advisement

## 2021-01-27 NOTE — Progress Notes (Signed)
Remote pacemaker transmission.   

## 2021-01-28 ENCOUNTER — Telehealth: Payer: Self-pay | Admitting: Cardiovascular Disease

## 2021-01-28 NOTE — Telephone Encounter (Signed)
*  STAT* If patient is at the pharmacy, call can be transferred to refill team.   1. Which medications need to be refilled? (please list name of each medication and dose if known)  Torsemide 40 MG TABS  2. Which pharmacy/location (including street and city if local pharmacy) is medication to be sent to? [facility], [place] - [address]  3. Do they need a 30 day or 90 day supply?  90 day

## 2021-01-29 MED ORDER — TORSEMIDE 20 MG PO TABS: 20.0000 mg | ORAL_TABLET | Freq: Every day | ORAL | 3 refills | Status: DC

## 2021-01-29 NOTE — Telephone Encounter (Signed)
Refills has been sent to the pharmacy.

## 2021-02-04 ENCOUNTER — Inpatient Hospital Stay: Payer: Medicare Other

## 2021-02-04 ENCOUNTER — Inpatient Hospital Stay: Payer: Medicare Other | Attending: Oncology

## 2021-02-04 ENCOUNTER — Other Ambulatory Visit: Payer: Self-pay

## 2021-02-04 VITALS — BP 147/70 | HR 61 | Temp 98.0°F | Resp 18

## 2021-02-04 DIAGNOSIS — D649 Anemia, unspecified: Secondary | ICD-10-CM | POA: Diagnosis present

## 2021-02-04 DIAGNOSIS — N189 Chronic kidney disease, unspecified: Secondary | ICD-10-CM | POA: Insufficient documentation

## 2021-02-04 DIAGNOSIS — D631 Anemia in chronic kidney disease: Secondary | ICD-10-CM

## 2021-02-04 DIAGNOSIS — N183 Chronic kidney disease, stage 3 unspecified: Secondary | ICD-10-CM

## 2021-02-04 LAB — CBC WITH DIFFERENTIAL (CANCER CENTER ONLY)
Abs Immature Granulocytes: 0.02 10*3/uL (ref 0.00–0.07)
Basophils Absolute: 0 10*3/uL (ref 0.0–0.1)
Basophils Relative: 0 %
Eosinophils Absolute: 0 10*3/uL (ref 0.0–0.5)
Eosinophils Relative: 1 %
HCT: 33.5 % — ABNORMAL LOW (ref 36.0–46.0)
Hemoglobin: 10.1 g/dL — ABNORMAL LOW (ref 12.0–15.0)
Immature Granulocytes: 0 %
Lymphocytes Relative: 6 %
Lymphs Abs: 0.3 10*3/uL — ABNORMAL LOW (ref 0.7–4.0)
MCH: 30.3 pg (ref 26.0–34.0)
MCHC: 30.1 g/dL (ref 30.0–36.0)
MCV: 100.6 fL — ABNORMAL HIGH (ref 80.0–100.0)
Monocytes Absolute: 0.3 10*3/uL (ref 0.1–1.0)
Monocytes Relative: 6 %
Neutro Abs: 4.4 10*3/uL (ref 1.7–7.7)
Neutrophils Relative %: 87 %
Platelet Count: 195 10*3/uL (ref 150–400)
RBC: 3.33 MIL/uL — ABNORMAL LOW (ref 3.87–5.11)
RDW: 16.2 % — ABNORMAL HIGH (ref 11.5–15.5)
WBC Count: 5.1 10*3/uL (ref 4.0–10.5)
nRBC: 0 % (ref 0.0–0.2)

## 2021-02-04 MED ORDER — EPOETIN ALFA-EPBX 20000 UNIT/ML IJ SOLN
20000.0000 [IU] | Freq: Once | INTRAMUSCULAR | Status: AC
  Administered 2021-02-04: 20000 [IU] via SUBCUTANEOUS
  Filled 2021-02-04: qty 1

## 2021-02-04 MED ORDER — EPOETIN ALFA-EPBX 40000 UNIT/ML IJ SOLN
40000.0000 [IU] | Freq: Once | INTRAMUSCULAR | Status: AC
  Administered 2021-02-04: 40000 [IU] via SUBCUTANEOUS

## 2021-02-04 NOTE — Patient Instructions (Signed)
Epoetin Alfa injection What is this medication? EPOETIN ALFA (e POE e tin AL fa) helps your body make more red blood cells. This medicine is used to treat anemia caused by chronic kidney disease, cancer chemotherapy, or HIV-therapy. It may also be used before surgery if you haveanemia. This medicine may be used for other purposes; ask your health care provider orpharmacist if you have questions. COMMON BRAND NAME(S): Epogen, Procrit, Retacrit What should I tell my care team before I take this medication? They need to know if you have any of these conditions: cancer heart disease high blood pressure history of blood clots history of stroke low levels of folate, iron, or vitamin B12 in the blood seizures an unusual or allergic reaction to erythropoietin, albumin, benzyl alcohol, hamster proteins, other medicines, foods, dyes, or preservatives pregnant or trying to get pregnant breast-feeding How should I use this medication? This medicine is for injection into a vein or under the skin. It is [place] by a health care professional in a hospital or clinic setting. If you get this medicine at home, you will be taught how to prepare and give this medicine. Use exactly as directed. Take your medicine at regularintervals. Do not take your medicine more often than directed. It is important that you put your used needles and syringes in a special sharps container. Do not put them in a trash can. If you do not have a sharpscontainer, call your pharmacist or healthcare provider to get one. A special MedGuide will be given to you by the pharmacist with eachprescription and refill. Be sure to read this information carefully each time. Talk to your pediatrician regarding the use of this medicine in children. Whilethis drug may be prescribed for selected conditions, precautions do apply. Overdosage: If you think you have taken too much of this medicine contact apoison control center or emergency room at  once. NOTE: This medicine is only for you. Do not share this medicine with others. What if I miss a dose? If you miss a dose, take it as soon as you can. If it is almost time for yournext dose, take only that dose. Do not take double or extra doses. What may interact with this medication? Interactions have not been studied. This list may not describe all possible interactions. Give your health care provider a list of all the medicines, herbs, non-prescription drugs, or dietary supplements you use. Also tell them if you smoke, drink alcohol, or use illegaldrugs. Some items may interact with your medicine. What should I watch for while using this medication? Your condition will be monitored carefully while you are receiving thismedicine. You may need blood work done while you are taking this medicine. This medicine may cause a decrease in vitamin B6. You should make sure that you get enough vitamin B6 while you are taking this medicine. Discuss the foods youeat and the vitamins you take with your health care professional. What side effects may I notice from receiving this medication? Side effects that you should report to your doctor or health care professionalas soon as possible: allergic reactions like skin rash, itching or hives, swelling of the face, lips, or tongue seizures signs and symptoms of a blood clot such as breathing problems; changes in vision; chest pain; severe, sudden headache; pain, swelling, warmth in the leg; trouble speaking; sudden numbness or weakness of the face, arm or leg signs and symptoms of a stroke like changes in vision; confusion; trouble speaking or understanding; severe headaches; sudden numbness or  changes in vision; confusion; trouble speaking or understanding; severe headaches; sudden numbness or weakness of the face, arm or leg; trouble walking; dizziness; loss of balance or coordination Side effects that usually do not require medical attention (report to your doctor or health care professional if they continue or are  bothersome): chills cough dizziness fever headaches joint pain muscle cramps muscle pain nausea, vomiting pain, redness, or irritation at site where injected This list may not describe all possible side effects. Call your doctor for medical advice about side effects. You may report side effects to FDA at 1-800-FDA-1088. Where should I keep my medication? Keep out of the reach of children. Store in a refrigerator between 2 and 8 degrees C (36 and 46 degrees F). Do not freeze or shake. Throw away any unused portion if using a single-dose vial. Multi-dose vials can be kept in the refrigerator for up to 21 days after the initial dose. Throw away unused medicine. NOTE: This sheet is a summary. It may not cover all possible information. If you have questions about this medicine, talk to your doctor, pharmacist, or health care provider.  2022 Elsevier/Gold Standard (2017-01-15 08:35:19)  

## 2021-02-25 ENCOUNTER — Inpatient Hospital Stay: Payer: Medicare Other | Attending: Oncology

## 2021-02-25 ENCOUNTER — Inpatient Hospital Stay: Payer: Medicare Other

## 2021-02-25 ENCOUNTER — Other Ambulatory Visit: Payer: Self-pay

## 2021-02-25 VITALS — BP 144/78 | HR 81 | Temp 98.1°F | Resp 18

## 2021-02-25 DIAGNOSIS — N189 Chronic kidney disease, unspecified: Secondary | ICD-10-CM | POA: Diagnosis present

## 2021-02-25 DIAGNOSIS — D649 Anemia, unspecified: Secondary | ICD-10-CM | POA: Diagnosis present

## 2021-02-25 DIAGNOSIS — N183 Chronic kidney disease, stage 3 unspecified: Secondary | ICD-10-CM

## 2021-02-25 DIAGNOSIS — D631 Anemia in chronic kidney disease: Secondary | ICD-10-CM

## 2021-02-25 LAB — CBC WITH DIFFERENTIAL (CANCER CENTER ONLY)
Abs Immature Granulocytes: 0.02 10*3/uL (ref 0.00–0.07)
Basophils Absolute: 0 10*3/uL (ref 0.0–0.1)
Basophils Relative: 0 %
Eosinophils Absolute: 0 10*3/uL (ref 0.0–0.5)
Eosinophils Relative: 1 %
HCT: 35.2 % — ABNORMAL LOW (ref 36.0–46.0)
Hemoglobin: 10.5 g/dL — ABNORMAL LOW (ref 12.0–15.0)
Immature Granulocytes: 0 %
Lymphocytes Relative: 11 %
Lymphs Abs: 0.7 10*3/uL (ref 0.7–4.0)
MCH: 29.9 pg (ref 26.0–34.0)
MCHC: 29.8 g/dL — ABNORMAL LOW (ref 30.0–36.0)
MCV: 100.3 fL — ABNORMAL HIGH (ref 80.0–100.0)
Monocytes Absolute: 0.4 10*3/uL (ref 0.1–1.0)
Monocytes Relative: 7 %
Neutro Abs: 5 10*3/uL (ref 1.7–7.7)
Neutrophils Relative %: 81 %
Platelet Count: 196 10*3/uL (ref 150–400)
RBC: 3.51 MIL/uL — ABNORMAL LOW (ref 3.87–5.11)
RDW: 16 % — ABNORMAL HIGH (ref 11.5–15.5)
WBC Count: 6.1 10*3/uL (ref 4.0–10.5)
nRBC: 0 % (ref 0.0–0.2)

## 2021-02-25 MED ORDER — EPOETIN ALFA-EPBX 40000 UNIT/ML IJ SOLN
40000.0000 [IU] | Freq: Once | INTRAMUSCULAR | Status: AC
  Administered 2021-02-25: 40000 [IU] via SUBCUTANEOUS

## 2021-02-25 MED ORDER — EPOETIN ALFA-EPBX 20000 UNIT/ML IJ SOLN
20000.0000 [IU] | Freq: Once | INTRAMUSCULAR | Status: AC
  Administered 2021-02-25: 20000 [IU] via SUBCUTANEOUS
  Filled 2021-02-25: qty 1

## 2021-02-25 NOTE — Patient Instructions (Signed)
Epoetin Alfa injection What is this medication? EPOETIN ALFA (e POE e tin AL fa) helps your body make more red blood cells. This medicine is used to treat anemia caused by chronic kidney disease, cancer chemotherapy, or HIV-therapy. It may also be used before surgery if you have anemia. This medicine may be used for other purposes; ask your health care provider or pharmacist if you have questions. COMMON BRAND NAME(S): Epogen, Procrit, Retacrit What should I tell my care team before I take this medication? They need to know if you have any of these conditions: cancer heart disease high blood pressure history of blood clots history of stroke low levels of folate, iron, or vitamin B12 in the blood seizures an unusual or allergic reaction to erythropoietin, albumin, benzyl alcohol, hamster proteins, other medicines, foods, dyes, or preservatives pregnant or trying to get pregnant breast-feeding How should I use this medication? This medicine is for injection into a vein or under the skin. It is usually given by a health care professional in a hospital or clinic setting. If you get this medicine at home, you will be taught how to prepare and give this medicine. Use exactly as directed. Take your medicine at regular intervals. Do not take your medicine more often than directed. It is important that you put your used needles and syringes in a special sharps container. Do not put them in a trash can. If you do not have a sharps container, call your pharmacist or healthcare provider to get one. A special MedGuide will be given to you by the pharmacist with each prescription and refill. Be sure to read this information carefully each time. Talk to your pediatrician regarding the use of this medicine in children. While this drug may be prescribed for selected conditions, precautions do apply. Overdosage: If you think you have taken too much of this medicine contact a poison control center or emergency  room at once. NOTE: This medicine is only for you. Do not share this medicine with others. What if I miss a dose? If you miss a dose, take it as soon as you can. If it is almost time for your next dose, take only that dose. Do not take double or extra doses. What may interact with this medication? Interactions have not been studied. This list may not describe all possible interactions. Give your health care provider a list of all the medicines, herbs, non-prescription drugs, or dietary supplements you use. Also tell them if you smoke, drink alcohol, or use illegal drugs. Some items may interact with your medicine. What should I watch for while using this medication? Your condition will be monitored carefully while you are receiving this medicine. You may need blood work done while you are taking this medicine. This medicine may cause a decrease in vitamin B6. You should make sure that you get enough vitamin B6 while you are taking this medicine. Discuss the foods you eat and the vitamins you take with your health care professional. What side effects may I notice from receiving this medication? Side effects that you should report to your doctor or health care professional as soon as possible: allergic reactions like skin rash, itching or hives, swelling of the face, lips, or tongue seizures signs and symptoms of a blood clot such as breathing problems; changes in vision; chest pain; severe, sudden headache; pain, swelling, warmth in the leg; trouble speaking; sudden numbness or weakness of the face, arm or leg signs and symptoms of a stroke like  changes in vision; confusion; trouble speaking or understanding; severe headaches; sudden numbness or weakness of the face, arm or leg; trouble walking; dizziness; loss of balance or coordination Side effects that usually do not require medical attention (report to your doctor or health care professional if they continue or are  bothersome): chills cough dizziness fever headaches joint pain muscle cramps muscle pain nausea, vomiting pain, redness, or irritation at site where injected This list may not describe all possible side effects. Call your doctor for medical advice about side effects. You may report side effects to FDA at 1-800-FDA-1088. Where should I keep my medication? Keep out of the reach of children. Store in a refrigerator between 2 and 8 degrees C (36 and 46 degrees F). Do not freeze or shake. Throw away any unused portion if using a single-dose vial. Multi-dose vials can be kept in the refrigerator for up to 21 days after the initial dose. Throw away unused medicine. NOTE: This sheet is a summary. It may not cover all possible information. If you have questions about this medicine, talk to your doctor, pharmacist, or health care provider.  2022 Elsevier/Gold Standard (2017-01-15 08:35:19)

## 2021-03-07 ENCOUNTER — Ambulatory Visit: Payer: Medicare Other | Admitting: Student

## 2021-03-12 ENCOUNTER — Telehealth: Payer: Self-pay | Admitting: Cardiovascular Disease

## 2021-03-12 ENCOUNTER — Other Ambulatory Visit: Payer: Self-pay | Admitting: Internal Medicine

## 2021-03-12 NOTE — Telephone Encounter (Signed)
Called patient, advised no recent telephone calls or labs for any changes to lasix- recent  RX was for 20 mg daily, only to increase back in August to 40 mg for 5 days. PharmD verbalized understanding, thankful for call back.

## 2021-03-12 NOTE — Telephone Encounter (Signed)
Prescription refill request for Eliquis received. Indication: afib  Last office visit: O Neal  Scr: 2.08, 11/12/2020 Age: 80 yo  Weight: 94.6 kg   Refill sent.

## 2021-03-12 NOTE — Telephone Encounter (Signed)
Pt c/o medication issue:  1. Name of Medication:  torsemide (DEMADEX) 20 MG tablet  2. How are you currently taking this medication (dosage and times per day)?   3. Are you having a reaction (difficulty breathing--STAT)?   4. What is your medication issue?   Mary-Ann with [facility] is requesting clarification on patient's medication instructions.  Phone #: [phone]

## 2021-03-18 ENCOUNTER — Telehealth: Payer: Self-pay

## 2021-03-18 ENCOUNTER — Inpatient Hospital Stay: Payer: Medicare Other

## 2021-03-18 ENCOUNTER — Inpatient Hospital Stay (HOSPITAL_BASED_OUTPATIENT_CLINIC_OR_DEPARTMENT_OTHER): Payer: Medicare Other | Admitting: Oncology

## 2021-03-18 ENCOUNTER — Other Ambulatory Visit: Payer: Self-pay

## 2021-03-18 VITALS — BP 149/85 | HR 88 | Temp 98.1°F | Resp 20 | Ht <= 58 in | Wt 179.2 lb

## 2021-03-18 DIAGNOSIS — N189 Chronic kidney disease, unspecified: Secondary | ICD-10-CM | POA: Diagnosis not present

## 2021-03-18 DIAGNOSIS — D631 Anemia in chronic kidney disease: Secondary | ICD-10-CM | POA: Diagnosis not present

## 2021-03-18 DIAGNOSIS — N183 Chronic kidney disease, stage 3 unspecified: Secondary | ICD-10-CM

## 2021-03-18 LAB — IRON AND TIBC
Iron: 77 ug/dL (ref 28–170)
Saturation Ratios: 18 % (ref 10.4–31.8)
TIBC: 438 ug/dL (ref 250–450)
UIBC: 361 ug/dL

## 2021-03-18 LAB — CBC WITH DIFFERENTIAL (CANCER CENTER ONLY)
Abs Immature Granulocytes: 0.01 10*3/uL (ref 0.00–0.07)
Basophils Absolute: 0 10*3/uL (ref 0.0–0.1)
Basophils Relative: 0 %
Eosinophils Absolute: 0 10*3/uL (ref 0.0–0.5)
Eosinophils Relative: 1 %
HCT: 34.8 % — ABNORMAL LOW (ref 36.0–46.0)
Hemoglobin: 10.8 g/dL — ABNORMAL LOW (ref 12.0–15.0)
Immature Granulocytes: 0 %
Lymphocytes Relative: 10 %
Lymphs Abs: 0.6 10*3/uL — ABNORMAL LOW (ref 0.7–4.0)
MCH: 30.2 pg (ref 26.0–34.0)
MCHC: 31 g/dL (ref 30.0–36.0)
MCV: 97.2 fL (ref 80.0–100.0)
Monocytes Absolute: 0.5 10*3/uL (ref 0.1–1.0)
Monocytes Relative: 8 %
Neutro Abs: 4.7 10*3/uL (ref 1.7–7.7)
Neutrophils Relative %: 81 %
Platelet Count: 197 10*3/uL (ref 150–400)
RBC: 3.58 MIL/uL — ABNORMAL LOW (ref 3.87–5.11)
RDW: 15.8 % — ABNORMAL HIGH (ref 11.5–15.5)
WBC Count: 5.8 10*3/uL (ref 4.0–10.5)
nRBC: 0 % (ref 0.0–0.2)

## 2021-03-18 LAB — FERRITIN: Ferritin: 49 ng/mL (ref 11–307)

## 2021-03-18 MED ORDER — EPOETIN ALFA-EPBX 10000 UNIT/ML IJ SOLN
20000.0000 [IU] | Freq: Once | INTRAMUSCULAR | Status: AC
  Administered 2021-03-18: 20000 [IU] via SUBCUTANEOUS

## 2021-03-18 MED ORDER — EPOETIN ALFA-EPBX 40000 UNIT/ML IJ SOLN
40000.0000 [IU] | Freq: Once | INTRAMUSCULAR | Status: AC
  Administered 2021-03-18: 40000 [IU] via SUBCUTANEOUS

## 2021-03-18 NOTE — Telephone Encounter (Signed)
Per Dr. Stevphen Meuse to give injection early.

## 2021-03-18 NOTE — Patient Instructions (Signed)
Epoetin Alfa injection What is this medication? EPOETIN ALFA (e POE e tin AL fa) helps your body make more red blood cells. This medicine is used to treat anemia caused by chronic kidney disease, cancer chemotherapy, or HIV-therapy. It may also be used before surgery if you have anemia. This medicine may be used for other purposes; ask your health care provider or pharmacist if you have questions. COMMON BRAND NAME(S): Epogen, Procrit, Retacrit What should I tell my care team before I take this medication? They need to know if you have any of these conditions: cancer heart disease high blood pressure history of blood clots history of stroke low levels of folate, iron, or vitamin B12 in the blood seizures an unusual or allergic reaction to erythropoietin, albumin, benzyl alcohol, hamster proteins, other medicines, foods, dyes, or preservatives pregnant or trying to get pregnant breast-feeding How should I use this medication? This medicine is for injection into a vein or under the skin. It is usually given by a health care professional in a hospital or clinic setting. If you get this medicine at home, you will be taught how to prepare and give this medicine. Use exactly as directed. Take your medicine at regular intervals. Do not take your medicine more often than directed. It is important that you put your used needles and syringes in a special sharps container. Do not put them in a trash can. If you do not have a sharps container, call your pharmacist or healthcare provider to get one. A special MedGuide will be given to you by the pharmacist with each prescription and refill. Be sure to read this information carefully each time. Talk to your pediatrician regarding the use of this medicine in children. While this drug may be prescribed for selected conditions, precautions do apply. Overdosage: If you think you have taken too much of this medicine contact a poison control center or emergency  room at once. NOTE: This medicine is only for you. Do not share this medicine with others. What if I miss a dose? If you miss a dose, take it as soon as you can. If it is almost time for your next dose, take only that dose. Do not take double or extra doses. What may interact with this medication? Interactions have not been studied. This list may not describe all possible interactions. Give your health care provider a list of all the medicines, herbs, non-prescription drugs, or dietary supplements you use. Also tell them if you smoke, drink alcohol, or use illegal drugs. Some items may interact with your medicine. What should I watch for while using this medication? Your condition will be monitored carefully while you are receiving this medicine. You may need blood work done while you are taking this medicine. This medicine may cause a decrease in vitamin B6. You should make sure that you get enough vitamin B6 while you are taking this medicine. Discuss the foods you eat and the vitamins you take with your health care professional. What side effects may I notice from receiving this medication? Side effects that you should report to your doctor or health care professional as soon as possible: allergic reactions like skin rash, itching or hives, swelling of the face, lips, or tongue seizures signs and symptoms of a blood clot such as breathing problems; changes in vision; chest pain; severe, sudden headache; pain, swelling, warmth in the leg; trouble speaking; sudden numbness or weakness of the face, arm or leg signs and symptoms of a stroke like  changes in vision; confusion; trouble speaking or understanding; severe headaches; sudden numbness or weakness of the face, arm or leg; trouble walking; dizziness; loss of balance or coordination Side effects that usually do not require medical attention (report to your doctor or health care professional if they continue or are  bothersome): chills cough dizziness fever headaches joint pain muscle cramps muscle pain nausea, vomiting pain, redness, or irritation at site where injected This list may not describe all possible side effects. Call your doctor for medical advice about side effects. You may report side effects to FDA at 1-800-FDA-1088. Where should I keep my medication? Keep out of the reach of children. Store in a refrigerator between 2 and 8 degrees C (36 and 46 degrees F). Do not freeze or shake. Throw away any unused portion if using a single-dose vial. Multi-dose vials can be kept in the refrigerator for up to 21 days after the initial dose. Throw away unused medicine. NOTE: This sheet is a summary. It may not cover all possible information. If you have questions about this medicine, talk to your doctor, pharmacist, or health care provider.  2022 Elsevier/Gold Standard (2017-01-15 08:35:19)

## 2021-03-18 NOTE — Progress Notes (Signed)
[facility] OFFICE PROGRESS NOTE   Diagnosis: Anemia  INTERVAL HISTORY:   Tricia Gonzales returns as scheduled.  No bleeding.  No new complaint.  She last received erythropoietin on 02/25/2021.  She takes iron once daily.  Objective:  Vital signs in last 24 hours:  Blood pressure (!) 149/85, pulse 88, temperature 98.1 F (36.7 C), temperature source Oral, resp. rate 20, height _0  (1.473 m), weight 179 lb 3.2 oz (81.3 kg), SpO2 96 %.    Resp: Scattered inspiratory rhonchi at the posterior chest bilaterally, no respiratory distress Cardio: Regular rate and rhythm GI: No hepatosplenomegaly, reducible ventral hernia Vascular: No leg edema, the right lower leg is larger than the left side   Lab Results:  Lab Results  Component Value Date   WBC 5.8 03/18/2021   HGB 10.8 (L) 03/18/2021   HCT 34.8 (L) 03/18/2021   MCV 97.2 03/18/2021   PLT 197 03/18/2021   NEUTROABS 4.7 03/18/2021    CMP  Lab Results  Component Value Date   NA 135 11/12/2020   K 4.8 11/12/2020   CL 98 11/12/2020   CO2 28 11/12/2020   GLUCOSE 154 (H) 11/12/2020   BUN 90 (H) 11/12/2020   CREATININE 2.08 (H) 11/12/2020   CALCIUM 9.1 11/12/2020   PROT 6.7 11/12/2020   ALBUMIN 3.8 11/12/2020   AST 14 (L) 11/12/2020   ALT 9 11/12/2020   ALKPHOS 85 11/12/2020   BILITOT 0.6 11/12/2020   GFRNONAA 24 (L) 11/12/2020   GFRAA 24 (L) 08/05/2020     Medications: I have reviewed the patient's current medications.   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;  quantitative immunoglobulins show IgG mildly decreased 654 (range [phone]), IgA and IgM in normal range; polyclonal serum light chain elevation 11/15/2015-hemoglobin 11.2 11/29/2015-hemoglobin 11.1 12/12/2005-hemoglobin 10.4; Aranesp 100 g every 2 weeks initiated 02/07/2016-hemoglobin 11.0, Aranesp held, changed to an every 3 week schedule Progressive anemia and decreased iron stores December 2017, treated with IV iron January 2018 with improvement She did not require Aranesp for 2 months and resumed Aranesp on a monthly schedule for 08/2016 Aranesp every 6 weeks beginning 05/04/2017 Progressive anemia and decreased iron stores 07/05/2017; IV iron  given 07/28/2017 and 08/06/2017 Persistent severe anemia, Aranesp change to every 2 weeks beginning 10/19/2017 Persistent anemia, decreased iron stores 11/30/2017 IV iron 12/13/2017, 12/20/2017, Decreased iron stores 05/17/2018  IV iron 06/24/2018 07/05/2018- no serum M spike, polyclonal serum light chain elevation, IgG mildly decreased with IgA and IgM within normal range  2 units of blood 08/17/2018 IV iron 08/17/2018 and 08/24/2018 Stool heme +08/30/2018, referred to GI 2 units of blood 09/01/2018 1 unit of blood 10/25/2018 11/11/2018 colonoscopy- diverticulosis in the entire examined colon.  End-to-end colocolonic anastomosis.  One 5 mm polyp in the ascending colon.  No cause for anemia. 11/11/2018 upper endoscopy- evidence of gastric antral vascular ectasia which is the likely cause of the patient's worsening anemia. 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 Hemoglobin stabilized following treatment of the Gave Every 6-week erythropoietin 06/22/2019, erythropoietin dose increased to 60,000 units 09/14/2019 Erythropoietin 60,000 units every 3 weeks 10/24/2019 Schedule changed to every  4 weeks 12/15/2019 IV iron 02/21/2020 and 02/29/2020, 06/25/2020, 07/03/2020 Erythropoietin adjusted to every  2 weeks beginning 05/23/2020 Erythropoietin changed to every 3 weeks beginning 11/12/2020 Erythropoietin continued every 3 weeks 01/14/2021 Erythropoietin changed to every 6 weeks 03/18/2021   2.   Chronic renal insufficiency   3.    Atrial fibrillation-maintained on apixaban   4.    Osteoarthritis   5.     Diabetes   6.     Sjogren's disease   7.     Pericarditis February 2017   8.     Colonoscopy 01/07/2012-prior segmental colectomy in the sigmoid colon; moderate diverticulosis throughout the colon; otherwise normal examination   Disposition: Tricia Gonzales appears stable.  The hemoglobin is in goal range.  She will continue erythropoietin.  We will follow-up on iron studies from today.  She would like to change to an every 6-week schedule.  She will call for symptoms of anemia.  She will return for erythropoietin in 6 weeks and 12 weeks.  She will be scheduled for an office visit in 12 weeks.  I encouraged her to remain up-to-date on influenza and COVID-19 vaccines.  [name], MD  03/18/2021  3:07 PM

## 2021-03-24 ENCOUNTER — Telehealth: Payer: Self-pay

## 2021-03-24 NOTE — Telephone Encounter (Signed)
-----  Message from Ladell Pier, MD sent at 03/20/2021  8:12 PM EDT ----- Please call patient, iron borderline low, continue oral iron, repeat ferritin and iron/tibc next lab

## 2021-04-01 ENCOUNTER — Other Ambulatory Visit: Payer: Self-pay

## 2021-04-01 DIAGNOSIS — N189 Chronic kidney disease, unspecified: Secondary | ICD-10-CM

## 2021-04-01 DIAGNOSIS — D631 Anemia in chronic kidney disease: Secondary | ICD-10-CM

## 2021-04-04 ENCOUNTER — Ambulatory Visit (INDEPENDENT_AMBULATORY_CARE_PROVIDER_SITE_OTHER): Payer: Medicare Other

## 2021-04-04 DIAGNOSIS — I442 Atrioventricular block, complete: Secondary | ICD-10-CM | POA: Diagnosis not present

## 2021-04-07 LAB — CUP PACEART REMOTE DEVICE CHECK
Battery Remaining Longevity: 29 mo
Battery Voltage: 2.96 V
Brady Statistic AP VP Percent: 99.51 %
Brady Statistic AP VS Percent: 0.01 %
Brady Statistic AS VP Percent: 0.32 %
Brady Statistic AS VS Percent: 0.16 %
Brady Statistic RA Percent Paced: 99.49 %
Brady Statistic RV Percent Paced: 99.81 %
Date Time Interrogation Session: 20221017102233
Implantable Lead Implant Date: 20161227
Implantable Lead Implant Date: 20161227
Implantable Lead Location: 753859
Implantable Lead Location: 753860
Implantable Lead Model: 5076
Implantable Lead Model: 5076
Implantable Pulse Generator Implant Date: 20161227
Lead Channel Impedance Value: 304 Ohm
Lead Channel Impedance Value: 323 Ohm
Lead Channel Impedance Value: 361 Ohm
Lead Channel Impedance Value: 399 Ohm
Lead Channel Pacing Threshold Amplitude: 0.75 V
Lead Channel Pacing Threshold Amplitude: 1.5 V
Lead Channel Pacing Threshold Pulse Width: 0.4 ms
Lead Channel Pacing Threshold Pulse Width: 0.4 ms
Lead Channel Sensing Intrinsic Amplitude: 2.125 mV
Lead Channel Sensing Intrinsic Amplitude: 2.125 mV
Lead Channel Sensing Intrinsic Amplitude: 23.375 mV
Lead Channel Sensing Intrinsic Amplitude: 23.375 mV
Lead Channel Setting Pacing Amplitude: 2 V
Lead Channel Setting Pacing Amplitude: 3 V
Lead Channel Setting Pacing Pulse Width: 0.4 ms
Lead Channel Setting Sensing Sensitivity: 2.8 mV

## 2021-04-11 NOTE — Progress Notes (Signed)
Remote pacemaker transmission.   

## 2021-05-06 ENCOUNTER — Inpatient Hospital Stay: Payer: Medicare Other

## 2021-05-07 ENCOUNTER — Other Ambulatory Visit: Payer: Self-pay

## 2021-05-07 ENCOUNTER — Inpatient Hospital Stay: Payer: Medicare Other

## 2021-05-07 ENCOUNTER — Inpatient Hospital Stay: Payer: Medicare Other | Attending: Oncology

## 2021-05-07 ENCOUNTER — Ambulatory Visit: Payer: Medicare Other | Attending: Internal Medicine

## 2021-05-07 ENCOUNTER — Encounter: Payer: Self-pay | Admitting: Oncology

## 2021-05-07 ENCOUNTER — Other Ambulatory Visit (HOSPITAL_BASED_OUTPATIENT_CLINIC_OR_DEPARTMENT_OTHER): Payer: Self-pay

## 2021-05-07 VITALS — BP 138/73 | HR 84 | Temp 98.2°F | Resp 18 | Ht <= 58 in | Wt 184.4 lb

## 2021-05-07 DIAGNOSIS — N189 Chronic kidney disease, unspecified: Secondary | ICD-10-CM

## 2021-05-07 DIAGNOSIS — N183 Chronic kidney disease, stage 3 unspecified: Secondary | ICD-10-CM

## 2021-05-07 DIAGNOSIS — D631 Anemia in chronic kidney disease: Secondary | ICD-10-CM

## 2021-05-07 DIAGNOSIS — Z23 Encounter for immunization: Secondary | ICD-10-CM

## 2021-05-07 LAB — RENAL FUNCTION PANEL
Albumin: 3.9 g/dL (ref 3.5–5.0)
Anion gap: 8 (ref 5–15)
BUN: 84 mg/dL — ABNORMAL HIGH (ref 8–23)
CO2: 30 mmol/L (ref 22–32)
Calcium: 9.4 mg/dL (ref 8.9–10.3)
Chloride: 100 mmol/L (ref 98–111)
Creatinine, Ser: 2.4 mg/dL — ABNORMAL HIGH (ref 0.44–1.00)
GFR, Estimated: 20 mL/min — ABNORMAL LOW (ref >60)
Glucose, Bld: 155 mg/dL — ABNORMAL HIGH (ref 70–99)
Phosphorus: 3.9 mg/dL (ref 2.5–4.6)
Potassium: 4.6 mmol/L (ref 3.5–5.1)
Sodium: 138 mmol/L (ref 135–145)

## 2021-05-07 LAB — CBC WITH DIFFERENTIAL (CANCER CENTER ONLY)
Abs Immature Granulocytes: 0.02 10*3/uL (ref 0.00–0.07)
Basophils Absolute: 0 10*3/uL (ref 0.0–0.1)
Basophils Relative: 0 %
Eosinophils Absolute: 0 10*3/uL (ref 0.0–0.5)
Eosinophils Relative: 0 %
HCT: 28.8 % — ABNORMAL LOW (ref 36.0–46.0)
Hemoglobin: 8.7 g/dL — ABNORMAL LOW (ref 12.0–15.0)
Immature Granulocytes: 0 %
Lymphocytes Relative: 8 %
Lymphs Abs: 0.6 10*3/uL — ABNORMAL LOW (ref 0.7–4.0)
MCH: 30.2 pg (ref 26.0–34.0)
MCHC: 30.2 g/dL (ref 30.0–36.0)
MCV: 100 fL (ref 80.0–100.0)
Monocytes Absolute: 0.5 10*3/uL (ref 0.1–1.0)
Monocytes Relative: 7 %
Neutro Abs: 6 10*3/uL (ref 1.7–7.7)
Neutrophils Relative %: 85 %
Platelet Count: 206 10*3/uL (ref 150–400)
RBC: 2.88 MIL/uL — ABNORMAL LOW (ref 3.87–5.11)
RDW: 17 % — ABNORMAL HIGH (ref 11.5–15.5)
WBC Count: 7.1 10*3/uL (ref 4.0–10.5)
nRBC: 0 % (ref 0.0–0.2)

## 2021-05-07 MED ORDER — EPOETIN ALFA-EPBX 40000 UNIT/ML IJ SOLN
40000.0000 [IU] | Freq: Once | INTRAMUSCULAR | Status: AC
  Administered 2021-05-07: 40000 [IU] via SUBCUTANEOUS

## 2021-05-07 MED ORDER — PFIZER COVID-19 VAC BIVALENT 30 MCG/0.3ML IM SUSP
INTRAMUSCULAR | 0 refills | Status: DC
  Filled 2021-05-07: qty 0.3, 1d supply, fill #0

## 2021-05-07 MED ORDER — EPOETIN ALFA-EPBX 10000 UNIT/ML IJ SOLN
20000.0000 [IU] | Freq: Once | INTRAMUSCULAR | Status: AC
  Administered 2021-05-07: 20000 [IU] via SUBCUTANEOUS
  Filled 2021-05-07: qty 2

## 2021-05-07 NOTE — Patient Instructions (Signed)
Epoetin Alfa injection What is this medication? EPOETIN ALFA (e POE e tin AL fa) helps your body make more red blood cells. This medicine is used to treat anemia caused by chronic kidney disease, cancer chemotherapy, or HIV-therapy. It may also be used before surgery if you have anemia. This medicine may be used for other purposes; ask your health care provider or pharmacist if you have questions. COMMON BRAND NAME(S): Epogen, Procrit, Retacrit What should I tell my care team before I take this medication? They need to know if you have any of these conditions: cancer heart disease high blood pressure history of blood clots history of stroke low levels of folate, iron, or vitamin B12 in the blood seizures an unusual or allergic reaction to erythropoietin, albumin, benzyl alcohol, hamster proteins, other medicines, foods, dyes, or preservatives pregnant or trying to get pregnant breast-feeding How should I use this medication? This medicine is for injection into a vein or under the skin. It is usually given by a health care professional in a hospital or clinic setting. If you get this medicine at home, you will be taught how to prepare and give this medicine. Use exactly as directed. Take your medicine at regular intervals. Do not take your medicine more often than directed. It is important that you put your used needles and syringes in a special sharps container. Do not put them in a trash can. If you do not have a sharps container, call your pharmacist or healthcare provider to get one. A special MedGuide will be given to you by the pharmacist with each prescription and refill. Be sure to read this information carefully each time. Talk to your pediatrician regarding the use of this medicine in children. While this drug may be prescribed for selected conditions, precautions do apply. Overdosage: If you think you have taken too much of this medicine contact a poison control center or emergency  room at once. NOTE: This medicine is only for you. Do not share this medicine with others. What if I miss a dose? If you miss a dose, take it as soon as you can. If it is almost time for your next dose, take only that dose. Do not take double or extra doses. What may interact with this medication? Interactions have not been studied. This list may not describe all possible interactions. Give your health care provider a list of all the medicines, herbs, non-prescription drugs, or dietary supplements you use. Also tell them if you smoke, drink alcohol, or use illegal drugs. Some items may interact with your medicine. What should I watch for while using this medication? Your condition will be monitored carefully while you are receiving this medicine. You may need blood work done while you are taking this medicine. This medicine may cause a decrease in vitamin B6. You should make sure that you get enough vitamin B6 while you are taking this medicine. Discuss the foods you eat and the vitamins you take with your health care professional. What side effects may I notice from receiving this medication? Side effects that you should report to your doctor or health care professional as soon as possible: allergic reactions like skin rash, itching or hives, swelling of the face, lips, or tongue seizures signs and symptoms of a blood clot such as breathing problems; changes in vision; chest pain; severe, sudden headache; pain, swelling, warmth in the leg; trouble speaking; sudden numbness or weakness of the face, arm or leg signs and symptoms of a stroke like  changes in vision; confusion; trouble speaking or understanding; severe headaches; sudden numbness or weakness of the face, arm or leg; trouble walking; dizziness; loss of balance or coordination Side effects that usually do not require medical attention (report to your doctor or health care professional if they continue or are  bothersome): chills cough dizziness fever headaches joint pain muscle cramps muscle pain nausea, vomiting pain, redness, or irritation at site where injected This list may not describe all possible side effects. Call your doctor for medical advice about side effects. You may report side effects to FDA at 1-800-FDA-1088. Where should I keep my medication? Keep out of the reach of children. Store in a refrigerator between 2 and 8 degrees C (36 and 46 degrees F). Do not freeze or shake. Throw away any unused portion if using a single-dose vial. Multi-dose vials can be kept in the refrigerator for up to 21 days after the initial dose. Throw away unused medicine. NOTE: This sheet is a summary. It may not cover all possible information. If you have questions about this medicine, talk to your doctor, pharmacist, or health care provider.  2022 Elsevier/Gold Standard (2017-02-09 00:00:00)

## 2021-05-07 NOTE — Progress Notes (Signed)
Covid-19 Vaccination Clinic  Name:  ALEANA FIFITA    MRN: [id number] DOB: 06-24-1940  05/07/2021  Ms. Tholl was observed post Covid-19 immunization for 15 minutes without incident. She was provided with Vaccine Information Sheet and instruction to access the V-Safe system.   Ms. Percle was instructed to call 911 with any severe reactions post vaccine: Difficulty breathing  Swelling of face and throat  A fast heartbeat  A bad rash all over body  Dizziness and weakness   Immunizations Administered     Name Date Dose VIS Date Route   Pfizer Covid-19 Vaccine Bivalent Booster 05/07/2021  2:33 PM 0.3 mL 02/19/2021 Intramuscular   Manufacturer: [place]   Lot: [id number]   [place]: [phone]

## 2021-05-08 ENCOUNTER — Other Ambulatory Visit: Payer: Self-pay

## 2021-05-08 ENCOUNTER — Telehealth: Payer: Self-pay

## 2021-05-08 DIAGNOSIS — N189 Chronic kidney disease, unspecified: Secondary | ICD-10-CM

## 2021-05-08 NOTE — Telephone Encounter (Addendum)
Patient informed of lab results.  Patient confirmed that she is taking iron supplement daily.  Patient informed that she will need to restart monthly Epo if hgb is not improved at next visit.  Labs added to patient's next lab appointment.   ----- Message from Ladell Pier, MD sent at 05/08/2021  6:31 AM EST ----- Please call patient, hb lower yesterday, be sure she is taking iron, will need to go back to monthly dosing of epo is hb not better next visit, call for symptoms of anemia, add ferritin,iron, tibc to next lab

## 2021-06-17 ENCOUNTER — Inpatient Hospital Stay (HOSPITAL_BASED_OUTPATIENT_CLINIC_OR_DEPARTMENT_OTHER): Payer: Medicare Other | Admitting: Nurse Practitioner

## 2021-06-17 ENCOUNTER — Encounter: Payer: Self-pay | Admitting: Nurse Practitioner

## 2021-06-17 ENCOUNTER — Other Ambulatory Visit: Payer: Self-pay

## 2021-06-17 ENCOUNTER — Inpatient Hospital Stay: Payer: Medicare Other

## 2021-06-17 ENCOUNTER — Inpatient Hospital Stay: Payer: Medicare Other | Attending: Oncology

## 2021-06-17 VITALS — BP 153/74 | HR 90 | Temp 97.8°F | Resp 18 | Ht <= 58 in | Wt 181.4 lb

## 2021-06-17 DIAGNOSIS — N189 Chronic kidney disease, unspecified: Secondary | ICD-10-CM

## 2021-06-17 DIAGNOSIS — N183 Chronic kidney disease, stage 3 unspecified: Secondary | ICD-10-CM | POA: Diagnosis present

## 2021-06-17 DIAGNOSIS — D631 Anemia in chronic kidney disease: Secondary | ICD-10-CM | POA: Diagnosis present

## 2021-06-17 LAB — CBC (CANCER CENTER ONLY)
HCT: 30.6 % — ABNORMAL LOW (ref 36.0–46.0)
Hemoglobin: 9.5 g/dL — ABNORMAL LOW (ref 12.0–15.0)
MCH: 31.5 pg (ref 26.0–34.0)
MCHC: 31 g/dL (ref 30.0–36.0)
MCV: 101.3 fL — ABNORMAL HIGH (ref 80.0–100.0)
Platelet Count: 211 10*3/uL (ref 150–400)
RBC: 3.02 MIL/uL — ABNORMAL LOW (ref 3.87–5.11)
RDW: 16.2 % — ABNORMAL HIGH (ref 11.5–15.5)
WBC Count: 5.1 10*3/uL (ref 4.0–10.5)
nRBC: 0 % (ref 0.0–0.2)

## 2021-06-17 LAB — IRON AND TIBC
Iron: 60 ug/dL (ref 28–170)
Saturation Ratios: 14 % (ref 10.4–31.8)
TIBC: 417 ug/dL (ref 250–450)
UIBC: 357 ug/dL

## 2021-06-17 LAB — FERRITIN: Ferritin: 33 ng/mL (ref 11–307)

## 2021-06-17 MED ORDER — EPOETIN ALFA-EPBX 40000 UNIT/ML IJ SOLN
40000.0000 [IU] | Freq: Once | INTRAMUSCULAR | Status: AC
  Administered 2021-06-17: 15:00:00 40000 [IU] via SUBCUTANEOUS

## 2021-06-17 MED ORDER — EPOETIN ALFA-EPBX 10000 UNIT/ML IJ SOLN
20000.0000 [IU] | Freq: Once | INTRAMUSCULAR | Status: AC
  Administered 2021-06-17: 15:00:00 20000 [IU] via SUBCUTANEOUS

## 2021-06-17 NOTE — Progress Notes (Signed)
[facility] OFFICE PROGRESS NOTE   Diagnosis: Anemia  INTERVAL HISTORY:   Tricia Gonzales returns as scheduled.  No blood with bowel movements.  She continues oral iron.  She intermittently sees "a little spot" of blood if she dry heaves or has vomiting.  This has not happened for the past week or so.  She reports pain at the left abdomen, worse at times with ambulation.  The pain has been present intermittently for several years.  Objective:  Vital signs in last 24 hours:  Blood pressure (!) 153/74, pulse 90, temperature 97.8 F (36.6 C), temperature source Oral, resp. rate 18, height _0  (1.473 m), weight 181 lb 6.4 oz (82.3 kg), SpO2 100 %.    Resp: Lungs clear bilaterally. Cardio: Regular rate and rhythm. GI: Abdomen is soft.  Generalized tenderness.  No palpable abdominal mass. Vascular: Trace edema lower leg bilaterally.   Lab Results:  Lab Results  Component Value Date   WBC 5.1 06/17/2021   HGB 9.5 (L) 06/17/2021   HCT 30.6 (L) 06/17/2021   MCV 101.3 (H) 06/17/2021   PLT 211 06/17/2021   NEUTROABS 6.0 05/07/2021    Imaging:  No results found.  Medications: I have reviewed the patient's current medications.  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; quantitative immunoglobulins show IgG mildly decreased 654 (range [phone]), IgA and IgM in normal range; polyclonal serum light chain elevation 11/15/2015-hemoglobin 11.2 11/29/2015-hemoglobin 11.1 12/12/2005-hemoglobin 10.4; Aranesp 100 g every 2 weeks  initiated 02/07/2016-hemoglobin 11.0, Aranesp held, changed to an every 3 week schedule Progressive anemia and decreased iron stores December 2017, treated with IV iron January 2018 with improvement She did not require Aranesp for 2 months and resumed Aranesp on a monthly schedule for 08/2016 Aranesp every 6 weeks beginning 05/04/2017 Progressive anemia and decreased iron stores 07/05/2017; IV iron  given 07/28/2017 and 08/06/2017 Persistent severe anemia, Aranesp change to every 2 weeks beginning 10/19/2017 Persistent anemia, decreased iron stores 11/30/2017 IV iron 12/13/2017, 12/20/2017, Decreased iron stores 05/17/2018  IV iron 06/24/2018 07/05/2018- no serum M spike, polyclonal serum light chain elevation, IgG mildly decreased with IgA and IgM within normal range  2 units of blood 08/17/2018 IV iron 08/17/2018 and 08/24/2018 Stool heme +08/30/2018, referred to GI 2 units of blood 09/01/2018 1 unit of blood 10/25/2018 11/11/2018 colonoscopy- diverticulosis in the entire examined colon.  End-to-end colocolonic anastomosis.  One 5 mm polyp in the ascending colon.  No cause for anemia. 11/11/2018 upper endoscopy- evidence of gastric antral vascular ectasia which is the likely cause of the patient's worsening anemia. 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 Hemoglobin stabilized following treatment of the Gave Every 6-week erythropoietin 06/22/2019, erythropoietin dose increased to 60,000 units 09/14/2019 Erythropoietin 60,000 units every 3 weeks 10/24/2019 Schedule changed to every 4 weeks 12/15/2019 IV iron 02/21/2020 and 02/29/2020, 06/25/2020, 07/03/2020 Erythropoietin adjusted to every 2 weeks beginning 05/23/2020 Erythropoietin changed to every 3 weeks beginning 11/12/2020 Erythropoietin continued every 3 weeks 01/14/2021 Erythropoietin changed to every 6 weeks 03/18/2021   2.   Chronic renal insufficiency   3.    Atrial  fibrillation-maintained on apixaban   4.  Osteoarthritis   5.     Diabetes   6.     Sjogren's disease   7.     Pericarditis February 2017   8.     Colonoscopy 01/07/2012-prior segmental colectomy in the sigmoid colon; moderate diverticulosis throughout the colon; otherwise normal examination    Disposition: Tricia Gonzales appears unchanged.  She is currently maintained on erythropoietin every 6 weeks.  Hemoglobin was lower 6 weeks ago, improved today.  Plan to continue erythropoietin every 6 weeks for now.  We will follow-up on the iron studies from today.  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  She understands to contact the office with signs/symptoms suggestive of progressive anemia.  Lab and injection in 6 weeks.  We will see her in follow-up in 12 weeks.  We are available to see her sooner if needed.  Patient seen with Dr. Benay Spice.    Ned Card ANP/GNP-BC   06/17/2021  2:03 PM This was a shared visit with Ned Card.  Tricia Gonzales was interviewed and examined.  The hemoglobin remains adequate while on erythropoietin at a 6-week interval.  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  I was present for greater than 50% of today's visit.  I performed medical decision making.  Julieanne Manson, MD

## 2021-06-18 ENCOUNTER — Telehealth: Payer: Self-pay

## 2021-06-18 NOTE — Telephone Encounter (Signed)
-----  Message from Tania Ade, RN sent at 06/18/2021 11:36 AM EST ----- Regarding: Appointment Ms. Reininger needs a return appointment with [name] please. See office note/referral order placed on 06/17/21.  Thank you, Susan,RN

## 2021-06-18 NOTE — Telephone Encounter (Signed)
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

## 2021-06-19 ENCOUNTER — Telehealth: Payer: Self-pay | Admitting: *Deleted

## 2021-06-19 NOTE — Telephone Encounter (Signed)
Patient made aware of low ferritin and need to initiate Feraheme weekly x 2 at MD suggestion. She agrees to this plan and would like to delay start to week on 07/14/21 in afternoon. Trying to get with [employer] to get appointment to renew her driver's license. Please avoid Monday and Wednesday due to physical therapy appointments. Scheduling message sent.

## 2021-06-19 NOTE — Telephone Encounter (Signed)
Lm on vm for patient to return call to set up a follow up appt.

## 2021-06-19 NOTE — Progress Notes (Signed)
Left v/m for Pt to return call to the office

## 2021-06-19 NOTE — Telephone Encounter (Signed)
Patient returned your call, was scheduled with [name] on 2/6 at 3:00.

## 2021-06-19 NOTE — Telephone Encounter (Signed)
Noted, thanks.

## 2021-07-02 ENCOUNTER — Other Ambulatory Visit: Payer: Self-pay | Admitting: Nurse Practitioner

## 2021-07-04 ENCOUNTER — Ambulatory Visit (INDEPENDENT_AMBULATORY_CARE_PROVIDER_SITE_OTHER): Payer: Medicare Other

## 2021-07-04 DIAGNOSIS — I442 Atrioventricular block, complete: Secondary | ICD-10-CM

## 2021-07-04 LAB — CUP PACEART REMOTE DEVICE CHECK
Battery Remaining Longevity: 29 mo
Battery Voltage: 2.96 V
Brady Statistic AP VP Percent: 98.81 %
Brady Statistic AP VS Percent: 0.03 %
Brady Statistic AS VP Percent: 1.03 %
Brady Statistic AS VS Percent: 0.13 %
Brady Statistic RA Percent Paced: 98.81 %
Brady Statistic RV Percent Paced: 99.82 %
Date Time Interrogation Session: 20230113142915
Implantable Lead Implant Date: 20161227
Implantable Lead Implant Date: 20161227
Implantable Lead Location: 753859
Implantable Lead Location: 753860
Implantable Lead Model: 5076
Implantable Lead Model: 5076
Implantable Pulse Generator Implant Date: 20161227
Lead Channel Impedance Value: 304 Ohm
Lead Channel Impedance Value: 323 Ohm
Lead Channel Impedance Value: 361 Ohm
Lead Channel Impedance Value: 399 Ohm
Lead Channel Pacing Threshold Amplitude: 0.75 V
Lead Channel Pacing Threshold Amplitude: 1.375 V
Lead Channel Pacing Threshold Pulse Width: 0.4 ms
Lead Channel Pacing Threshold Pulse Width: 0.4 ms
Lead Channel Sensing Intrinsic Amplitude: 1.25 mV
Lead Channel Sensing Intrinsic Amplitude: 1.25 mV
Lead Channel Sensing Intrinsic Amplitude: 19.75 mV
Lead Channel Sensing Intrinsic Amplitude: 19.75 mV
Lead Channel Setting Pacing Amplitude: 2 V
Lead Channel Setting Pacing Amplitude: 2.75 V
Lead Channel Setting Pacing Pulse Width: 0.4 ms
Lead Channel Setting Sensing Sensitivity: 2.8 mV

## 2021-07-15 ENCOUNTER — Other Ambulatory Visit: Payer: Self-pay

## 2021-07-15 ENCOUNTER — Other Ambulatory Visit: Payer: Medicare Other

## 2021-07-15 ENCOUNTER — Inpatient Hospital Stay: Payer: Medicare Other | Attending: Oncology

## 2021-07-15 VITALS — BP 155/65 | HR 60 | Temp 98.2°F | Resp 18 | Ht <= 58 in | Wt 186.0 lb

## 2021-07-15 DIAGNOSIS — D631 Anemia in chronic kidney disease: Secondary | ICD-10-CM | POA: Insufficient documentation

## 2021-07-15 DIAGNOSIS — N183 Chronic kidney disease, stage 3 unspecified: Secondary | ICD-10-CM | POA: Diagnosis not present

## 2021-07-15 MED ORDER — SODIUM CHLORIDE 0.9 % IV SOLN
510.0000 mg | Freq: Once | INTRAVENOUS | Status: AC
  Administered 2021-07-15: 13:00:00 510 mg via INTRAVENOUS
  Filled 2021-07-15: qty 510

## 2021-07-15 MED ORDER — SODIUM CHLORIDE 0.9 % IV SOLN: Freq: Once | INTRAVENOUS | Status: AC

## 2021-07-15 NOTE — Progress Notes (Signed)
Remote pacemaker transmission.

## 2021-07-15 NOTE — Progress Notes (Signed)
Patient monitored for 15 minutes post iron infusion.  Patient stated she did not want to stay for full 30 minutes observation time, stating "I haven't had any issues with it in the past".  Patient educated on importance of observation period and given instructions on when to go to ED.  Patient verbalized understanding.  VSS upon leaving infusion room.

## 2021-07-15 NOTE — Patient Instructions (Signed)
Ferumoxytol Injection °What is this medication? °FERUMOXYTOL (FER ue MOX i tol) treats low levels of iron in your body (iron deficiency anemia). Iron is a mineral that plays an important role in making red blood cells, which carry oxygen from your lungs to the rest of your body. °This medicine may be used for other purposes; ask your health care provider or pharmacist if you have questions. °COMMON BRAND NAME(S): Feraheme °What should I tell my care team before I take this medication? °They need to know if you have any of these conditions: °Anemia not caused by low iron levels °High levels of iron in the blood °Magnetic resonance imaging (MRI) test scheduled °An unusual or allergic reaction to iron, other medications, foods, dyes, or preservatives °Pregnant or trying to get pregnant °Breast-feeding °How should I use this medication? °This medication is for injection into a vein. It is given in a hospital or clinic setting. °Talk to your care team the use of this medication in children. Special care may be needed. °Overdosage: If you think you have taken too much of this medicine contact a poison control center or emergency room at once. °NOTE: This medicine is only for you. Do not share this medicine with others. °What if I miss a dose? °It is important not to miss your dose. Call your care team if you are unable to keep an appointment. °What may interact with this medication? °Other iron products °This list may not describe all possible interactions. Give your health care provider a list of all the medicines, herbs, non-prescription drugs, or dietary supplements you use. Also tell them if you smoke, drink alcohol, or use illegal drugs. Some items may interact with your medicine. °What should I watch for while using this medication? °Visit your care team regularly. Tell your care team if your symptoms do not start to get better or if they get worse. You may need blood work done while you are taking this  medication. °You may need to follow a special diet. Talk to your care team. Foods that contain iron include: whole grains/cereals, dried fruits, beans, or peas, leafy green vegetables, and organ meats (liver, kidney). °What side effects may I notice from receiving this medication? °Side effects that you should report to your care team as soon as possible: °Allergic reactions--skin rash, itching, hives, swelling of the face, lips, tongue, or throat °Low blood pressure--dizziness, feeling faint or lightheaded, blurry vision °Shortness of breath °Side effects that usually do not require medical attention (report to your care team if they continue or are bothersome): °Flushing °Headache °Joint pain °Muscle pain °Nausea °Pain, redness, or irritation at injection site °This list may not describe all possible side effects. Call your doctor for medical advice about side effects. You may report side effects to FDA at 1-800-FDA-1088. °Where should I keep my medication? °This medication is given in a hospital or clinic and will not be stored at home. °NOTE: This sheet is a summary. It may not cover all possible information. If you have questions about this medicine, talk to your doctor, pharmacist, or health care provider. °© 2022 Elsevier/Gold Standard (2020-11-01 00:00:00) ° °

## 2021-07-22 ENCOUNTER — Inpatient Hospital Stay: Payer: Medicare Other

## 2021-07-22 ENCOUNTER — Other Ambulatory Visit: Payer: Self-pay

## 2021-07-22 ENCOUNTER — Other Ambulatory Visit: Payer: Medicare Other

## 2021-07-22 VITALS — BP 153/69 | HR 63 | Temp 98.0°F | Resp 18

## 2021-07-22 DIAGNOSIS — N183 Chronic kidney disease, stage 3 unspecified: Secondary | ICD-10-CM | POA: Diagnosis not present

## 2021-07-22 DIAGNOSIS — D631 Anemia in chronic kidney disease: Secondary | ICD-10-CM

## 2021-07-22 DIAGNOSIS — N189 Chronic kidney disease, unspecified: Secondary | ICD-10-CM

## 2021-07-22 MED ORDER — SODIUM CHLORIDE 0.9 % IV SOLN: INTRAVENOUS | Status: DC

## 2021-07-22 MED ORDER — SODIUM CHLORIDE 0.9 % IV SOLN
510.0000 mg | Freq: Once | INTRAVENOUS | Status: AC
  Administered 2021-07-22: 510 mg via INTRAVENOUS
  Filled 2021-07-22: qty 17

## 2021-07-22 NOTE — Progress Notes (Signed)
Patient declined to stay for full 30 minute observation period.  Patient monitored for 20 minutes with no issues.  VSS upon leaving unit.

## 2021-07-22 NOTE — Patient Instructions (Signed)
Ferumoxytol Injection What is this medication? FERUMOXYTOL (FER ue MOX i tol) treats low levels of iron in your body (iron deficiency anemia). Iron is a mineral that plays an important role in making red blood cells, which carry oxygen from your lungs to the rest of your body. This medicine may be used for other purposes; ask your health care provider or pharmacist if you have questions. COMMON BRAND NAME(S): Feraheme What should I tell my care team before I take this medication? They need to know if you have any of these conditions: Anemia not caused by low iron levels High levels of iron in the blood Magnetic resonance imaging (MRI) test scheduled An unusual or allergic reaction to iron, other medications, foods, dyes, or preservatives Pregnant or trying to get pregnant Breast-feeding How should I use this medication? This medication is for injection into a vein. It is given in a hospital or clinic setting. Talk to your care team the use of this medication in children. Special care may be needed. Overdosage: If you think you have taken too much of this medicine contact a poison control center or emergency room at once. NOTE: This medicine is only for you. Do not share this medicine with others. What if I miss a dose? It is important not to miss your dose. Call your care team if you are unable to keep an appointment. What may interact with this medication? Other iron products This list may not describe all possible interactions. Give your health care provider a list of all the medicines, herbs, non-prescription drugs, or dietary supplements you use. Also tell them if you smoke, drink alcohol, or use illegal drugs. Some items may interact with your medicine. What should I watch for while using this medication? Visit your care team regularly. Tell your care team if your symptoms do not start to get better or if they get worse. You may need blood work done while you are taking this  medication. You may need to follow a special diet. Talk to your care team. Foods that contain iron include: whole grains/cereals, dried fruits, beans, or peas, leafy green vegetables, and organ meats (liver, kidney). What side effects may I notice from receiving this medication? Side effects that you should report to your care team as soon as possible: Allergic reactions--skin rash, itching, hives, swelling of the face, lips, tongue, or throat Low blood pressure--dizziness, feeling faint or lightheaded, blurry vision Shortness of breath Side effects that usually do not require medical attention (report to your care team if they continue or are bothersome): Flushing Headache Joint pain Muscle pain Nausea Pain, redness, or irritation at injection site This list may not describe all possible side effects. Call your doctor for medical advice about side effects. You may report side effects to FDA at 1-800-FDA-1088. Where should I keep my medication? This medication is given in a hospital or clinic and will not be stored at home. NOTE: This sheet is a summary. It may not cover all possible information. If you have questions about this medicine, talk to your doctor, pharmacist, or health care provider.  2022 Elsevier/Gold Standard (2020-11-01 00:00:00)

## 2021-07-27 NOTE — Progress Notes (Signed)
Cardiology Office Note:   Date:  07/29/2021  NAME:  Tricia Gonzales    MRN: [id number] DOB:  24-Dec-1940   PCP:  Carol Ada, MD  Cardiologist:  Evalina Field, MD  Electrophysiologist:  Cristopher Peru, MD   Referring MD: Carol Ada, MD   Chief Complaint  Patient presents with   Follow-up         History of Present Illness:   Tricia Gonzales is a 81 y.o. female with a hx of HFpEF, Ckd IV, HTN, anemia, ppm who presents for follow-up.  She reports she is doing well.  Kidney function is stable.  Denies any chest pain or trouble breathing.  Her kidney doctor is managing her torsemide.  Kidney function appears to be stable.  Most recent serum creatinine 1.9.  We discussed reducing her dose of Eliquis.  She is okay to do this.  She stopped taking amiodarone.  She was having nausea from this.  Recent device interrogation shows she is maintaining sinus rhythm.  For now I think it is okay to forego this.  Diabetes numbers acceptable.  Most recent cholesterol profile is acceptable.  Blood pressure elevated.  She reports it appears to be more stable at home.  She is not wanting to increase the medications today.  Problem List 1. Paroxysmal Afib -CHADSVASC=5 -DCCV 07/31/2020 2. HFpEF 3. Junctional bradycardia s/p ppm 2016 4. DM -A1c 6.8 5. HTN 6. 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 HLD -T chol 123, HDL 42, LDL 61, TG 108 8. 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   Anxiety    Arthritis    Atrial fibrillation (HCC)    Avascular necrosis (HCC) hip left   leg pain also   Chronic back pain    Chronic kidney disease (CKD), stage III (moderate) (HCC)    Depression    Diverticulosis    Early cataracts, bilateral    Esophageal stricture    Gastritis    GAVE (gastric antral vascular ectasia)    GERD (gastroesophageal reflux disease)    Glaucoma    Heart murmur     " some doctors say that I  have one some say that I don"t "   Hiatal hernia    History of blood transfusion    "@ least w/1st knee OR"   History of gout    History of kidney stones    Hyperlipidemia    Hypertension    Intestinal obstruction ([place])    Kidney stones    Obesity    Osteoarthritis    Osteoporosis    Pericarditis 2016   Presence of permanent cardiac pacemaker    Sjogren's disease ([place])    Thyroid disease    Type II diabetes mellitus ([place])    Vitamin B12 deficiency     Past Surgical History: Past Surgical History:  Procedure Laterality Date   ABDOMINAL HYSTERECTOMY     complete   CARDIOVERSION N/A 07/31/2020   Procedure: CARDIOVERSION;  Surgeon: Werner Lean, MD;  Location: [facility];  Service: Cardiovascular;  Laterality: N/A;   CHOLECYSTECTOMY OPEN     COLECTOMY     for rectovaginal fistula   COLONOSCOPY  01/07/2012   Procedure: COLONOSCOPY;  Surgeon: Lafayette Dragon, MD;  Location: [facility];  Service: Endoscopy;  Laterality: N/A;   EP IMPLANTABLE DEVICE N/A 06/18/2015   Procedure: Pacemaker Implant;  Surgeon: Champ Mungo  Lovena Le, MD;  Location: [facility];  Service: Cardiovascular;  Laterality: N/A;   ESOPHAGEAL DILATION  08/17/2020   Procedure: ESOPHAGEAL DILATION;  Surgeon: Lavena Bullion, DO;  Location: [facility];  Service: Gastroenterology;;   ESOPHAGOGASTRODUODENOSCOPY N/A 11/24/2018   Procedure: ESOPHAGOGASTRODUODENOSCOPY (EGD);  Surgeon: Yetta Flock, MD;  Location: [facility];  Service: Gastroenterology;  Laterality: N/A;   ESOPHAGOGASTRODUODENOSCOPY (EGD) WITH ESOPHAGEAL DILATION     ESOPHAGOGASTRODUODENOSCOPY (EGD) WITH PROPOFOL N/A 08/12/2015   Procedure: ESOPHAGOGASTRODUODENOSCOPY (EGD) WITH PROPOFOL;  Surgeon: Manus Gunning, MD;  Location: [facility];  Service: Gastroenterology;  Laterality: N/A;   ESOPHAGOGASTRODUODENOSCOPY (EGD) WITH PROPOFOL N/A 08/17/2020   Procedure: ESOPHAGOGASTRODUODENOSCOPY (EGD) WITH PROPOFOL;  Surgeon:  Lavena Bullion, DO;  Location: [facility];  Service: Gastroenterology;  Laterality: N/A;   EYE SURGERY Bilateral    cataracts   HOT HEMOSTASIS N/A 11/24/2018   Procedure: HOT HEMOSTASIS (ARGON PLASMA COAGULATION/BICAP);  Surgeon: Yetta Flock, MD;  Location: [facility];  Service: Gastroenterology;  Laterality: N/A;   HOT HEMOSTASIS N/A 08/17/2020   Procedure: HOT HEMOSTASIS (ARGON PLASMA COAGULATION/BICAP);  Surgeon: Lavena Bullion, DO;  Location: [facility];  Service: Gastroenterology;  Laterality: N/A;   I & D EXTREMITY Right 08/06/2016   Procedure: DEBRIDEMENT PIP RIGHT RING FINGER;  Surgeon: Daryll Brod, MD;  Location: [place];  Service: Orthopedics;  Laterality: Right;   INSERT / REPLACE / REMOVE PACEMAKER     JOINT REPLACEMENT     KNEE ARTHROSCOPY Right    MASS EXCISION Right 08/06/2016   Procedure: EXCISION CYST;  Surgeon: Daryll Brod, MD;  Location: [facility];  Service: Orthopedics;  Laterality: Right;   REVISION TOTAL KNEE ARTHROPLASTY Left    TOTAL KNEE ARTHROPLASTY Bilateral     Current Medications: Current Meds  Medication Sig   ACCU-CHEK AVIVA PLUS test strip 1 each by Other route daily.    acetaminophen (TYLENOL) 650 MG CR tablet Take 1,300 mg by mouth every evening.   BD PEN NEEDLE NANO U/F 32G X 4 MM MISC See admin instructions.   brimonidine (ALPHAGAN) 0.2 % ophthalmic solution Place 1 drop into the right eye 2 (two) times daily.   carvedilol (COREG) 3.125 MG tablet TAKE TWO TABLETS IN THE MORNING AND TAKEONE TABLET IN THE EVENING (Patient taking differently: Take 3.125-6.25 mg by mouth 2 (two) times daily with a meal. 6.25 mg in the morning and 3.125 mg in the evening.)   Cholecalciferol (VITAMIN D3) 25 MCG (1000 UT) CAPS Take 1,000 Units by mouth daily.   cycloSPORINE (RESTASIS) 0.05 % ophthalmic emulsion Place 1 drop into both eyes 2 (two) times daily.   febuxostat (ULORIC) 40 MG tablet Take 1 tablet (40 mg total) by mouth every Monday, Wednesday, and  Friday.   ferrous sulfate 325 (65 FE) MG EC tablet Take 1 tablet (325 mg total) by mouth 2 (two) times daily. (Patient taking differently: Take 325 mg by mouth daily with breakfast.)   gabapentin (NEURONTIN) 300 MG capsule Take 1 capsule (300 mg total) by mouth daily.   gabapentin (NEURONTIN) 400 MG capsule Take 1 capsule (400 mg total) by mouth at bedtime.   levothyroxine (SYNTHROID) 75 MCG tablet Take 75 mcg by mouth every morning.   montelukast (SINGULAIR) 10 MG tablet Take 1 tablet (10 mg total) by mouth at bedtime.   pantoprazole (PROTONIX) 40 MG tablet Take 40 mg by mouth daily.   pentoxifylline (TRENTAL) 400 MG CR tablet TAKE ONE TABLET EACH DAY WITH A MEAL  promethazine (PHENERGAN) 12.5 MG tablet Take 1 tablet (12.5 mg total) by mouth every 8 (eight) hours as needed for nausea or vomiting.   rOPINIRole (REQUIP) 0.5 MG tablet Take 0.5 mg by mouth at bedtime.   rosuvastatin (CRESTOR) 5 MG tablet Take 1 tablet (5 mg total) by mouth daily. (Patient taking differently: Take 5 mg by mouth at bedtime.)   sennosides-docusate sodium (SENOKOT-S) 8.6-50 MG tablet 1 tablet   traMADol (ULTRAM) 50 MG tablet Take 50 mg by mouth See admin instructions. Take 50 mg by mouth at bedtime and an additional 50 mg once a day as needed for pain   TRESIBA FLEXTOUCH 100 UNIT/ML FlexTouch Pen Inject 10 Units into the skin every evening.   [DISCONTINUED] apixaban (ELIQUIS) 5 MG TABS tablet Take 1 tablet (5 mg total) by mouth 2 (two) times daily.     Allergies:    Norvasc [amlodipine besylate], Other, Amlodipine besylate, Avapro [irbesartan], Glucophage [metformin hydrochloride], Lisinopril, Losartan, Morphine and related, Simvastatin, and Trulicity [dulaglutide]   Social History: Social History   Socioeconomic History   Marital status: Divorced    Spouse name: Not on file   Number of children: 2   Years of education: Not on file   Highest education level: Not on file  Occupational History   Occupation:  retired  Tobacco Use   Smoking status: Former    Packs/day: 2.00    Years: 3.00    Pack years: 6.00    Types: Cigarettes    Quit date: 06/23/1983    Years since quitting: 38.1   Smokeless tobacco: Never  Vaping Use   Vaping Use: Never used  Substance and Sexual Activity   Alcohol use: Yes    Alcohol/week: 2.0 standard drinks    Types: 2 Glasses of wine per week    Comment: occ   Drug use: No   Sexual activity: Not Currently  Other Topics Concern   Not on file  Social History Narrative   Not on file   Social Determinants of Health   Financial Resource Strain: Not on file  Food Insecurity: Not on file  Transportation Needs: Not on file  Physical Activity: Not on file  Stress: Not on file  Social Connections: Not on file     Family History: The patient's family history includes Diabetes in her brother and mother; Hypertension in her sister. There is no history of Colon cancer, Heart attack, Stroke, Colon polyps, Esophageal cancer, Rectal cancer, or Stomach cancer.  ROS:   All other ROS reviewed and negative. Pertinent positives noted in the HPI.     EKGs/Labs/Other Studies Reviewed:   The following studies were personally reviewed by me today:  TTE 08/16/2020 1. Left ventricular ejection fraction, by estimation, is 50 to 55%. The  left ventricle has low normal function. The left ventricle has no regional  wall motion abnormalities.   2. Right ventricular systolic function is normal. The right ventricular  size is normal. There is moderately elevated pulmonary artery systolic  pressure.   3. The mitral valve is normal in structure. Moderate to severe mitral  valve regurgitation. No evidence of mitral stenosis. Moderate mitral  annular calcification.   4. The aortic valve is normal in structure. Aortic valve regurgitation is  not visualized. No aortic stenosis is present.   5. The inferior vena cava is dilated in size with <50% respiratory  variability, suggesting  right atrial pressure of 15 mmHg.   Recent Labs: 08/15/2020: B Natriuretic Peptide 1,245.9; TSH 5.867  08/20/2020: Magnesium 2.0 11/12/2020: ALT 9 05/07/2021: BUN 84; Creatinine, Ser 2.40; Potassium 4.6; Sodium 138 06/17/2021: Hemoglobin 9.5; Platelet Count 211   Recent Lipid Panel No results found for: CHOL, TRIG, HDL, CHOLHDL, VLDL, LDLCALC, LDLDIRECT  Physical Exam:   VS:  BP (!) 150/82 (BP Location: Left Arm, Patient Position: Sitting, Cuff Size: Large)    Pulse 74    Ht 4' 10.5" (1.486 m)    Wt 183 lb 3.2 oz (83.1 kg)    SpO2 96%    BMI 37.64 kg/m    Wt Readings from Last 3 Encounters:  07/29/21 183 lb 3.2 oz (83.1 kg)  07/28/21 183 lb (83 kg)  07/15/21 186 lb (84.4 kg)    General: Well nourished, well developed, in no acute distress Head: Atraumatic, normal size  Eyes: PEERLA, EOMI  Neck: Supple, no JVD Endocrine: No thryomegaly Cardiac: Normal S1, S2; RRR; no murmurs, rubs, or gallops Lungs: Clear to auscultation bilaterally, no wheezing, rhonchi or rales  Abd: Soft, nontender, no hepatomegaly  Ext: 1+ pitting edema in the left lower extremity Musculoskeletal: No deformities, BUE and BLE strength normal and equal Skin: Warm and dry, no rashes   Neuro: Alert and oriented to person, place, time, and situation, CNII-XII grossly intact, no focal deficits  Psych: Normal mood and affect   ASSESSMENT:   Tricia Gonzales is a 81 y.o. female who presents for the following: 1. Complete heart block ([place])   2. Pacemaker   3. Chronic diastolic heart failure (HCC)   4. Paroxysmal atrial fibrillation ([place])   5. Acquired thrombophilia ([place])     PLAN:   1. Complete heart block ([place]) 2. Pacemaker -Status post pacemaker implantation.  Doing well.  Functioning normally.  3. Chronic diastolic heart failure (HCC) -Fairly euvolemic on exam.  Continue torsemide 40 mg daily.  She takes extra tablets at the recommendation of her nephrologist.  Most recent kidney function is stable. -Blood  pressure but she would like to continue her carvedilol.  She would not like to increase the dose.  4. Paroxysmal atrial fibrillation (HCC) 5. Acquired thrombophilia ([place]) -History of persistent atrial fibrillation status post cardioversion on 07/31/2020.  Limited options for rhythm control we decided on amiodarone.  She did have side effect of nausea on this medication.  She has stopped the amiodarone.  Symptoms have improved.  Recent device interrogation shows no recurrence of atrial fibrillation.  We will continue with Coreg 3.125 mg twice daily for now.  She is monitoring her volume status as well.  Most recent echo shows normal LV function. -Given her age of 69 years and serum creatinine over 1.5 we will reduce her Eliquis to 2.5 mg twice daily.    Disposition: Return in about 6 months (around 01/26/2022).  Medication Adjustments/Labs and Tests Ordered: Current medicines are reviewed at length with the patient today.  Concerns regarding medicines are outlined above.  No orders of the defined types were placed in this encounter.  Meds ordered this encounter  Medications   apixaban (ELIQUIS) 2.5 MG TABS tablet    Sig: Take 1 tablet (2.5 mg total) by mouth 2 (two) times daily.    Dispense:  180 tablet    Refill:  1    Patient Instructions  Medication Instructions:  Decrease Eliquis to 2.5 mg twice daily  *If you need a refill on your cardiac medications before your next appointment, please call your pharmacy*   Follow-Up: At [facility], you and your health needs are our  priority.  As part of our continuing mission to provide you with exceptional heart care, we have created designated Provider Care Teams.  These Care Teams include your primary Cardiologist (physician) and Advanced Practice Providers (APPs -  Physician Assistants and Nurse Practitioners) who all work together to provide you with the care you need, when you need it.  We recommend signing up for the patient portal called  "MyChart".  Sign up information is provided on this After Visit Summary.  MyChart is used to connect with patients for Virtual Visits (Telemedicine).  Patients are able to view lab/test results, encounter notes, upcoming appointments, etc.  Non-urgent messages can be sent to your provider as well.   To learn more about what you can do with MyChart, go to [email or web address].    Your next appointment:   6 month(s)  The format for your next appointment:   In Person  Provider:   Evalina Field, MD or Sande Rives, PA-C, or Almyra Deforest, PA-C      Time Spent with Patient: I have spent a total of 35 minutes with patient reviewing hospital notes, telemetry, EKGs, labs and examining the patient as well as establishing an assessment and plan that was discussed with the patient.  > 50% of time was spent in direct patient care.  Signed, Addison Naegeli. Audie Box, MD, [place]  [address], [place], [place] [address] [phone]  07/29/2021 11:48 AM

## 2021-07-28 ENCOUNTER — Ambulatory Visit (INDEPENDENT_AMBULATORY_CARE_PROVIDER_SITE_OTHER): Payer: Medicare Other | Admitting: Gastroenterology

## 2021-07-28 ENCOUNTER — Encounter: Payer: Self-pay | Admitting: Gastroenterology

## 2021-07-28 VITALS — BP 170/80 | HR 74 | Ht 58.5 in | Wt 183.0 lb

## 2021-07-28 DIAGNOSIS — R1032 Left lower quadrant pain: Secondary | ICD-10-CM

## 2021-07-28 DIAGNOSIS — K5900 Constipation, unspecified: Secondary | ICD-10-CM

## 2021-07-28 DIAGNOSIS — D649 Anemia, unspecified: Secondary | ICD-10-CM

## 2021-07-28 MED ORDER — LINACLOTIDE 72 MCG PO CAPS: 72.0000 ug | ORAL_CAPSULE | Freq: Every day | ORAL | 0 refills | Status: DC

## 2021-07-28 MED ORDER — LINACLOTIDE 145 MCG PO CAPS: 145.0000 ug | ORAL_CAPSULE | Freq: Every day | ORAL | 0 refills | Status: DC

## 2021-07-28 MED ORDER — IBGARD 90 MG PO CPCR: ORAL_CAPSULE | ORAL | Status: DC

## 2021-07-28 NOTE — Patient Instructions (Addendum)
If you are age 80 or older, your body mass index should be between 23-30. Your Body mass index is 37.6 kg/m. If this is out of the aforementioned range listed, please consider follow up with your Primary Care Provider.  If you are age 77 or younger, your body mass index should be between 19-25. Your Body mass index is 37.6 kg/m. If this is out of the aformentioned range listed, please consider follow up with your Primary Care Provider.   ________________________________________________________  The [facility] providers would like to encourage you to use [facility] to communicate with providers for non-urgent requests or questions.  Due to long hold times on the telephone, sending your provider a message by [facility] may be a faster and more efficient way to get a response.  Please allow 48 business hours for a response.  Please remember that this is for non-urgent requests.  _______________________________________________________  We have given you samples of the following medication to take: Linzess 145 mcg: Take one capsule daily in the morning.  You can add (1) 72 mcg capsule or go down to (1) 72 mcg capsule as needed to achieve desired results.  Please purchase the following medications over the counter and take as directed: IBGard: Use as directed  Dulcolax or Senna  Thank you for entrusting me with your care and for choosing [employer], Dr. Bergholz Cellar

## 2021-07-28 NOTE — Progress Notes (Signed)
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  I last saw her in April 2022. She states she has been having left lower quadrant pain for the past several months.  States it tends to come and go, not daily.  Worse with standing or at least more aware of it when standing when it occurs but can happen at any time.  She states it previously was daily but less frequent more recently.  Since of last seen her nausea is been better she is not having any vomiting.  She is eating okay.  She has been much more constipated lately than usual.  Having a bowel movement upwards of every 3 to 4 days.  She spends about an hour in the bathroom getting stool out when she does need to go and straining.  She has been quite constipated in this light.  She was using MiraLAX for constipation but thinks this caused her nausea, it got better when she stopped taking MiraLAX.  She is using senna at this point time but still not moving her bowels that frequently.  Her pain is relieved with a bowel movement on a reliable basis.  She also takes some tramadol at baseline for chronic pain.  On iron supplementation for her anemia.  Recall about a year ago she was admitted to the hospital symptomatic anemia. Her hemoglobin had down trended to 5's, this occurred in the setting of Eliquis use. She underwent an EGD with Dr. Bryan Lemma February 26.  She had a GE J stricture that was dilated with a balloon, she also had GAVE noted and was treated with APC with good result.  She has been receiving IV iron infusions and her hemoglobin has been relatively stable but she does have chronic anemia.  Her last iron studies look normal.  Her colonoscopy was done last in 2020 without any high risk lesions.  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 Treated with argon plasma coagulation (APC). - Normal duodenal bulb and second portion of the duodenum.   May 2020 colonoscopy for anemia and heme positive stool.  -Diverticulosis in the entire examined colon. - End-to-end colo-colonic anastomosis, characterized by visible sutures. - One 5 mm polyp in the ascending colon, removed with a cold snare. Resected and retrieved. - Internal hemorrhoids. - The examination was otherwise normal.   Path = sessile serrated polyp   EGD 08/17/20 - Dr. Bryan Lemma  - Benign-appearing esophageal stenosis. Dilated with 20 mm TTS balloon with appropriate mucosal rent. - 2 cm hiatal hernia. - Gastric antral vascular ectasia. Treated with argon plasma coagulation (APC). - Normal gastric fundus and gastric body. - Normal examined duodenum. - No specimens collected.   Echo 08/16/20 - EF 50-55%, moderate pulm HTN        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   Anxiety    Arthritis    Atrial fibrillation (HCC)    Avascular necrosis (HCC) hip left   leg pain also   Chronic back pain    Chronic kidney disease (CKD), stage III (moderate) (HCC)    Depression    Diverticulosis    Early cataracts, bilateral    Esophageal stricture    Gastritis    GAVE (  gastric antral vascular ectasia)    GERD (gastroesophageal reflux disease)    Glaucoma    Heart murmur     " some doctors say that I have one some say that I don"t "   Hiatal hernia    History of blood transfusion    "@ least w/1st knee OR"   History of gout    History of kidney stones    Hyperlipidemia    Hypertension    Intestinal obstruction ([place])    Kidney stones    Obesity    Osteoarthritis    Osteoporosis    Pericarditis 2016   Presence of permanent cardiac pacemaker    Sjogren's disease ([place])    Thyroid disease    Type II diabetes mellitus ([place])    Vitamin B12 deficiency      Past Surgical History:   Procedure Laterality Date   ABDOMINAL HYSTERECTOMY     complete   CARDIOVERSION N/A 07/31/2020   Procedure: CARDIOVERSION;  Surgeon: Werner Lean, MD;  Location: [facility];  Service: Cardiovascular;  Laterality: N/A;   CHOLECYSTECTOMY OPEN     COLECTOMY     for rectovaginal fistula   COLONOSCOPY  01/07/2012   Procedure: COLONOSCOPY;  Surgeon: Lafayette Dragon, MD;  Location: [facility];  Service: Endoscopy;  Laterality: N/A;   EP IMPLANTABLE DEVICE N/A 06/18/2015   Procedure: Pacemaker Implant;  Surgeon: Evans Lance, MD;  Location: [place] [facility];  Service: Cardiovascular;  Laterality: N/A;   ESOPHAGEAL DILATION  08/17/2020   Procedure: ESOPHAGEAL DILATION;  Surgeon: Lavena Bullion, DO;  Location: [place] [facility];  Service: Gastroenterology;;   ESOPHAGOGASTRODUODENOSCOPY N/A 11/24/2018   Procedure: ESOPHAGOGASTRODUODENOSCOPY (EGD);  Surgeon: Yetta Flock, MD;  Location: [facility];  Service: Gastroenterology;  Laterality: N/A;   ESOPHAGOGASTRODUODENOSCOPY (EGD) WITH ESOPHAGEAL DILATION     ESOPHAGOGASTRODUODENOSCOPY (EGD) WITH PROPOFOL N/A 08/12/2015   Procedure: ESOPHAGOGASTRODUODENOSCOPY (EGD) WITH PROPOFOL;  Surgeon: Manus Gunning, MD;  Location: [place];  Service: Gastroenterology;  Laterality: N/A;   ESOPHAGOGASTRODUODENOSCOPY (EGD) WITH PROPOFOL N/A 08/17/2020   Procedure: ESOPHAGOGASTRODUODENOSCOPY (EGD) WITH PROPOFOL;  Surgeon: Lavena Bullion, DO;  Location: [place];  Service: Gastroenterology;  Laterality: N/A;   EYE SURGERY Bilateral    cataracts   HOT HEMOSTASIS N/A 11/24/2018   Procedure: HOT HEMOSTASIS (ARGON PLASMA COAGULATION/BICAP);  Surgeon: Yetta Flock, MD;  Location: [facility];  Service: Gastroenterology;  Laterality: N/A;   HOT HEMOSTASIS N/A 08/17/2020   Procedure: HOT HEMOSTASIS (ARGON PLASMA COAGULATION/BICAP);  Surgeon: Lavena Bullion, DO;  Location: [facility] END[facility];  Service: Gastroenterology;  Laterality:  N/A;   I & D EXTREMITY Right 08/06/2016   Procedure: DEBRIDEMENT PIP RIGHT RING FINGER;  Surgeon: Daryll Brod, MD;  Location: [place];  Service: Orthopedics;  Laterality: Right;   INSERT / REPLACE / REMOVE PACEMAKER     JOINT REPLACEMENT     KNEE ARTHROSCOPY Right    MASS EXCISION Right 08/06/2016   Procedure: EXCISION CYST;  Surgeon: Daryll Brod, MD;  Location: [place];  Service: Orthopedics;  Laterality: Right;   REVISION TOTAL KNEE ARTHROPLASTY Left    TOTAL KNEE ARTHROPLASTY Bilateral    Family History  Problem Relation Age of Onset   Diabetes Mother    Diabetes Brother    Hypertension Sister    Colon cancer Neg Hx    Heart attack Neg Hx    Stroke Neg Hx    Colon polyps Neg Hx    Esophageal cancer Neg Hx  Rectal cancer Neg Hx    Stomach cancer Neg Hx    Social History   Tobacco Use   Smoking status: Former    Packs/day: 2.00    Years: 3.00    Pack years: 6.00    Types: Cigarettes    Quit date: 06/23/1983    Years since quitting: 38.1   Smokeless tobacco: Never  Vaping Use   Vaping Use: Never used  Substance Use Topics   Alcohol use: Yes    Alcohol/week: 2.0 standard drinks    Types: 2 Glasses of wine per week    Comment: occ   Drug use: No   Current Outpatient Medications  Medication Sig Dispense Refill   ACCU-CHEK AVIVA PLUS test strip 1 each by Other route daily.      acetaminophen (TYLENOL) 650 MG CR tablet Take 1,300 mg by mouth every evening.     amoxicillin (AMOXIL) 500 MG capsule SMARTSIG:4 Capsule(s) By Mouth     apixaban (ELIQUIS) 5 MG TABS tablet Take 1 tablet (5 mg total) by mouth 2 (two) times daily. 180 tablet 1   BD PEN NEEDLE NANO U/F 32G X 4 MM MISC See admin instructions.     brimonidine (ALPHAGAN) 0.2 % ophthalmic solution Place 1 drop into the right eye 2 (two) times daily.     carvedilol (COREG) 3.125 MG tablet TAKE TWO TABLETS IN THE MORNING AND TAKEONE TABLET IN THE EVENING (Patient taking differently: Take 3.125-6.25 mg by mouth 2 (two) times  daily with a meal. 6.25 mg in the morning and 3.125 mg in the evening.) 90 tablet 9   Cholecalciferol (VITAMIN D3) 25 MCG (1000 UT) CAPS Take 1,000 Units by mouth daily.     COVID-19 mRNA bivalent vaccine, Pfizer, (PFIZER COVID-19 VAC BIVALENT) injection Inject into the muscle. 0.3 mL 0   cycloSPORINE (RESTASIS) 0.05 % ophthalmic emulsion Place 1 drop into both eyes 2 (two) times daily.     febuxostat (ULORIC) 40 MG tablet Take 1 tablet (40 mg total) by mouth every Monday, Wednesday, and Friday. 12 tablet 0   ferrous sulfate 325 (65 FE) MG EC tablet Take 1 tablet (325 mg total) by mouth 2 (two) times daily. (Patient taking differently: Take 325 mg by mouth daily with breakfast.) 60 tablet 3   gabapentin (NEURONTIN) 300 MG capsule Take 1 capsule (300 mg total) by mouth daily. 30 capsule 0   gabapentin (NEURONTIN) 400 MG capsule Take 1 capsule (400 mg total) by mouth at bedtime. 30 capsule 0   levothyroxine (SYNTHROID) 75 MCG tablet Take 75 mcg by mouth every morning.     lidocaine (LIDODERM) 5 % Place 1-3 patches onto the skin See admin instructions. Apply 1-3 patches transdermally every 24 hours as needed for pain and remove & discard patches within 12 hours or as directed by MD     montelukast (SINGULAIR) 10 MG tablet Take 1 tablet (10 mg total) by mouth at bedtime. 30 tablet 0   nystatin (NYAMYC) powder APPLY TOPICALLY TO LEFT ABDOMINAL FOLD AND GROIN TWICE A DAY (Patient taking differently: Apply 1 application topically See admin instructions. Abdominal fold and groin bid prn rash) 15 g 0   pantoprazole (PROTONIX) 40 MG tablet Take 40 mg by mouth daily.     pentoxifylline (TRENTAL) 400 MG CR tablet TAKE ONE TABLET EACH DAY WITH A MEAL     Polyethylene Glycol 3350 (MIRALAX PO) Take 17 g by mouth daily.     promethazine (PHENERGAN) 12.5 MG tablet Take 1  tablet (12.5 mg total) by mouth every 8 (eight) hours as needed for nausea or vomiting. 60 tablet 1   rOPINIRole (REQUIP) 0.5 MG tablet Take 0.5 mg  by mouth at bedtime.     rosuvastatin (CRESTOR) 5 MG tablet Take 1 tablet (5 mg total) by mouth daily. (Patient taking differently: Take 5 mg by mouth at bedtime.) 30 tablet 0   sennosides-docusate sodium (SENOKOT-S) 8.6-50 MG tablet 1 tablet     torsemide (DEMADEX) 20 MG tablet Take 1 tablet (20 mg total) by mouth daily. 90 tablet 3   Torsemide 40 MG TABS Take 40 mg by mouth as needed (fluid).     traMADol (ULTRAM) 50 MG tablet Take 50 mg by mouth See admin instructions. Take 50 mg by mouth at bedtime and an additional 50 mg once a day as needed for pain     traZODone (DESYREL) 50 MG tablet 1 tablet at bedtime as needed     TRESIBA FLEXTOUCH 100 UNIT/ML FlexTouch Pen Inject 10 Units into the skin every evening.     No current facility-administered medications for this visit.   Allergies  Allergen Reactions   Norvasc [Amlodipine Besylate] Shortness Of Breath   Other Rash and Other (See Comments)    Pt voiced she is allergic to microfiber materials/ blankets/sheets!!   Amlodipine Besylate     Other reaction(s): SOB   Avapro [Irbesartan] Other (See Comments)    Headaches   Glucophage [Metformin Hydrochloride] Other (See Comments)    "increases creatinine"   Lisinopril     Headache    Losartan Other (See Comments)    Headaches    Morphine And Related Other (See Comments)    "Hallucinations"   Simvastatin Other (See Comments)    Body aches   Trulicity [Dulaglutide] Other (See Comments)    Severe mood swings     Review of Systems: All systems reviewed and negative except where noted in HPI.    Lab Results  Component Value Date   WBC 5.1 06/17/2021   HGB 9.5 (L) 06/17/2021   HCT 30.6 (L) 06/17/2021   MCV 101.3 (H) 06/17/2021   PLT 211 06/17/2021    CBC Latest Ref Rng & Units 06/17/2021 05/07/2021 03/18/2021  WBC 4.0 - 10.5 K/uL 5.1 7.1 5.8  Hemoglobin 12.0 - 15.0 g/dL 9.5(L) 8.7(L) 10.8(L)  Hematocrit 36.0 - 46.0 % 30.6(L) 28.8(L) 34.8(L)  Platelets 150 - 400 K/uL 211  206 197    Lab Results  Component Value Date   CREATININE 2.40 (H) 05/07/2021   BUN 84 (H) 05/07/2021   NA 138 05/07/2021   K 4.6 05/07/2021   CL 100 05/07/2021   CO2 30 05/07/2021    Lab Results  Component Value Date   ALT 9 11/12/2020   AST 14 (L) 11/12/2020   ALKPHOS 85 11/12/2020   BILITOT 0.6 11/12/2020     Physical Exam: BP (!) 170/80    Pulse 74    Ht 4' 10.5" (1.486 m)    Wt 183 lb (83 kg)    BMI 37.60 kg/m  Constitutional: Pleasant,well-developed, female in no acute distress. Abdominal: Soft, nondistended, mild LLQ TTP, . There are no masses palpable.  Extremities: no edema Neurological: Alert and oriented to person place and time. Skin: Skin is warm and dry. No rashes noted. Psychiatric: Normal mood and affect. Behavior is normal.   ASSESSMENT AND PLAN: 81 year old female here for reassessment of the following:  Left lower quadrant pain Constipation Anemia  As above several months  worth of intermittent left lower quadrant pain that seems to be relieved with a bowel movement, in the setting of worsening constipation.  Based on her history I think constipation may be driving some of the symptoms, recommend getting her bowels moving more regularly and see if that will relieve her pain.  She does not like taking MiraLAX makes her nauseated.  We will give her some Linzess 145 mcg/day samples and some 72 mcg/day samples, she can start at 145 mcg/day and titrate up or down based on her response.  She can also try some IBgard over-the-counter as needed for cramps, holding off on Bentyl as this may make her constipation worse.  She can also use Dulcolax and senna as needed.  If she is not getting any better despite moving her bowels more frequently and treatment of constipation over the next few weeks I asked her to let me know and we will consider a CT scan to further evaluate if her symptoms persist.  Reassured her that a colonoscopy is up-to-date and looked okay.  She  agrees.  Otherwise her anemia is likely multifactorial due to CKD and GAVE.  On IV iron and her hemoglobin has been relatively stable.  She is higher than average risk for elective endoscopic procedures given her comorbidities, will continue to trend hemoglobin over time.  Plan: - start trial of Linzess as outlined - IB gard PRN OTC - Dulcolax  / senna PRN - continue to monitor Hgb and iron - would pursue CT scan if no better after treatment of constipation  Jolly Mango, MD [facility] Gastroenterology

## 2021-07-29 ENCOUNTER — Ambulatory Visit (INDEPENDENT_AMBULATORY_CARE_PROVIDER_SITE_OTHER): Payer: Medicare Other | Admitting: Cardiovascular Disease

## 2021-07-29 ENCOUNTER — Encounter: Payer: Self-pay | Admitting: Cardiovascular Disease

## 2021-07-29 ENCOUNTER — Other Ambulatory Visit: Payer: Self-pay

## 2021-07-29 VITALS — BP 150/82 | HR 74 | Ht 58.5 in | Wt 183.2 lb

## 2021-07-29 DIAGNOSIS — I480 Paroxysmal atrial fibrillation: Secondary | ICD-10-CM | POA: Diagnosis not present

## 2021-07-29 DIAGNOSIS — Z950 Presence of cardiac pacemaker: Secondary | ICD-10-CM | POA: Diagnosis not present

## 2021-07-29 DIAGNOSIS — I5032 Chronic diastolic (congestive) heart failure: Secondary | ICD-10-CM | POA: Diagnosis not present

## 2021-07-29 DIAGNOSIS — I442 Atrioventricular block, complete: Secondary | ICD-10-CM

## 2021-07-29 DIAGNOSIS — D6869 Other thrombophilia: Secondary | ICD-10-CM

## 2021-07-29 MED ORDER — APIXABAN 2.5 MG PO TABS: 2.5000 mg | ORAL_TABLET | Freq: Two times a day (BID) | ORAL | 1 refills | Status: DC

## 2021-07-29 NOTE — Patient Instructions (Signed)
Medication Instructions:  Decrease Eliquis to 2.5 mg twice daily  *If you need a refill on your cardiac medications before your next appointment, please call your pharmacy*   Follow-Up: At [facility], you and your health needs are our priority.  As part of our continuing mission to provide you with exceptional heart care, we have created designated Provider Care Teams.  These Care Teams include your primary Cardiologist (physician) and Advanced Practice Providers (APPs -  Physician Assistants and Nurse Practitioners) who all work together to provide you with the care you need, when you need it.  We recommend signing up for the patient portal called "MyChart".  Sign up information is provided on this After Visit Summary.  MyChart is used to connect with patients for Virtual Visits (Telemedicine).  Patients are able to view lab/test results, encounter notes, upcoming appointments, etc.  Non-urgent messages can be sent to your provider as well.   To learn more about what you can do with MyChart, go to [email or web address].    Your next appointment:   6 month(s)  The format for your next appointment:   In Person  Provider:   Evalina Field, MD or Sande Rives, PA-C, or Almyra Deforest, [place]

## 2021-07-31 ENCOUNTER — Inpatient Hospital Stay: Payer: Medicare Other

## 2021-07-31 ENCOUNTER — Inpatient Hospital Stay: Payer: Medicare Other | Attending: Oncology

## 2021-07-31 ENCOUNTER — Other Ambulatory Visit: Payer: Self-pay

## 2021-07-31 VITALS — BP 149/72 | HR 73 | Resp 18 | Ht 58.5 in | Wt 183.8 lb

## 2021-07-31 DIAGNOSIS — N189 Chronic kidney disease, unspecified: Secondary | ICD-10-CM

## 2021-07-31 DIAGNOSIS — D631 Anemia in chronic kidney disease: Secondary | ICD-10-CM

## 2021-07-31 DIAGNOSIS — N183 Chronic kidney disease, stage 3 unspecified: Secondary | ICD-10-CM | POA: Diagnosis not present

## 2021-07-31 LAB — RENAL FUNCTION PANEL
Albumin: 3.9 g/dL (ref 3.5–5.0)
Anion gap: 8 (ref 5–15)
BUN: 67 mg/dL — ABNORMAL HIGH (ref 8–23)
CO2: 29 mmol/L (ref 22–32)
Calcium: 9.5 mg/dL (ref 8.9–10.3)
Chloride: 101 mmol/L (ref 98–111)
Creatinine, Ser: 1.8 mg/dL — ABNORMAL HIGH (ref 0.44–1.00)
GFR, Estimated: 28 mL/min — ABNORMAL LOW (ref >60)
Glucose, Bld: 150 mg/dL — ABNORMAL HIGH (ref 70–99)
Phosphorus: 4 mg/dL (ref 2.5–4.6)
Potassium: 4.9 mmol/L (ref 3.5–5.1)
Sodium: 138 mmol/L (ref 135–145)

## 2021-07-31 LAB — CBC WITH DIFFERENTIAL (CANCER CENTER ONLY)
Abs Immature Granulocytes: 0.02 10*3/uL (ref 0.00–0.07)
Basophils Absolute: 0 10*3/uL (ref 0.0–0.1)
Basophils Relative: 0 %
Eosinophils Absolute: 0 10*3/uL (ref 0.0–0.5)
Eosinophils Relative: 1 %
HCT: 32 % — ABNORMAL LOW (ref 36.0–46.0)
Hemoglobin: 9.7 g/dL — ABNORMAL LOW (ref 12.0–15.0)
Immature Granulocytes: 0 %
Lymphocytes Relative: 10 %
Lymphs Abs: 0.6 10*3/uL — ABNORMAL LOW (ref 0.7–4.0)
MCH: 30.5 pg (ref 26.0–34.0)
MCHC: 30.3 g/dL (ref 30.0–36.0)
MCV: 100.6 fL — ABNORMAL HIGH (ref 80.0–100.0)
Monocytes Absolute: 0.4 10*3/uL (ref 0.1–1.0)
Monocytes Relative: 8 %
Neutro Abs: 4.8 10*3/uL (ref 1.7–7.7)
Neutrophils Relative %: 81 %
Platelet Count: 191 10*3/uL (ref 150–400)
RBC: 3.18 MIL/uL — ABNORMAL LOW (ref 3.87–5.11)
RDW: 15.2 % (ref 11.5–15.5)
WBC Count: 5.9 10*3/uL (ref 4.0–10.5)
nRBC: 0 % (ref 0.0–0.2)

## 2021-07-31 MED ORDER — EPOETIN ALFA-EPBX 10000 UNIT/ML IJ SOLN
20000.0000 [IU] | Freq: Once | INTRAMUSCULAR | Status: AC
  Administered 2021-07-31: 20000 [IU] via SUBCUTANEOUS
  Filled 2021-07-31: qty 2

## 2021-07-31 MED ORDER — EPOETIN ALFA-EPBX 40000 UNIT/ML IJ SOLN
40000.0000 [IU] | Freq: Once | INTRAMUSCULAR | Status: AC
  Administered 2021-07-31: 40000 [IU] via SUBCUTANEOUS
  Filled 2021-07-31: qty 1

## 2021-07-31 NOTE — Patient Instructions (Signed)
Epoetin Alfa injection What is this medication? EPOETIN ALFA (e POE e tin AL fa) helps your body make more red blood cells. This medicine is used to treat anemia caused by chronic kidney disease, cancer chemotherapy, or HIV-therapy. It may also be used before surgery if you have anemia. This medicine may be used for other purposes; ask your health care provider or pharmacist if you have questions. COMMON BRAND NAME(S): Epogen, Procrit, Retacrit What should I tell my care team before I take this medication? They need to know if you have any of these conditions: cancer heart disease high blood pressure history of blood clots history of stroke low levels of folate, iron, or vitamin B12 in the blood seizures an unusual or allergic reaction to erythropoietin, albumin, benzyl alcohol, hamster proteins, other medicines, foods, dyes, or preservatives pregnant or trying to get pregnant breast-feeding How should I use this medication? This medicine is for injection into a vein or under the skin. It is usually given by a health care professional in a hospital or clinic setting. If you get this medicine at home, you will be taught how to prepare and give this medicine. Use exactly as directed. Take your medicine at regular intervals. Do not take your medicine more often than directed. It is important that you put your used needles and syringes in a special sharps container. Do not put them in a trash can. If you do not have a sharps container, call your pharmacist or healthcare provider to get one. A special MedGuide will be given to you by the pharmacist with each prescription and refill. Be sure to read this information carefully each time. Talk to your pediatrician regarding the use of this medicine in children. While this drug may be prescribed for selected conditions, precautions do apply. Overdosage: If you think you have taken too much of this medicine contact a poison control center or emergency  room at once. NOTE: This medicine is only for you. Do not share this medicine with others. What if I miss a dose? If you miss a dose, take it as soon as you can. If it is almost time for your next dose, take only that dose. Do not take double or extra doses. What may interact with this medication? Interactions have not been studied. This list may not describe all possible interactions. Give your health care provider a list of all the medicines, herbs, non-prescription drugs, or dietary supplements you use. Also tell them if you smoke, drink alcohol, or use illegal drugs. Some items may interact with your medicine. What should I watch for while using this medication? Your condition will be monitored carefully while you are receiving this medicine. You may need blood work done while you are taking this medicine. This medicine may cause a decrease in vitamin B6. You should make sure that you get enough vitamin B6 while you are taking this medicine. Discuss the foods you eat and the vitamins you take with your health care professional. What side effects may I notice from receiving this medication? Side effects that you should report to your doctor or health care professional as soon as possible: allergic reactions like skin rash, itching or hives, swelling of the face, lips, or tongue seizures signs and symptoms of a blood clot such as breathing problems; changes in vision; chest pain; severe, sudden headache; pain, swelling, warmth in the leg; trouble speaking; sudden numbness or weakness of the face, arm or leg signs and symptoms of a stroke like  changes in vision; confusion; trouble speaking or understanding; severe headaches; sudden numbness or weakness of the face, arm or leg; trouble walking; dizziness; loss of balance or coordination Side effects that usually do not require medical attention (report to your doctor or health care professional if they continue or are  bothersome): chills cough dizziness fever headaches joint pain muscle cramps muscle pain nausea, vomiting pain, redness, or irritation at site where injected This list may not describe all possible side effects. Call your doctor for medical advice about side effects. You may report side effects to FDA at 1-800-FDA-1088. Where should I keep my medication? Keep out of the reach of children. Store in a refrigerator between 2 and 8 degrees C (36 and 46 degrees F). Do not freeze or shake. Throw away any unused portion if using a single-dose vial. Multi-dose vials can be kept in the refrigerator for up to 21 days after the initial dose. Throw away unused medicine. NOTE: This sheet is a summary. It may not cover all possible information. If you have questions about this medicine, talk to your doctor, pharmacist, or health care provider.  2022 Elsevier/Gold Standard (2017-02-09 00:00:00)

## 2021-08-07 ENCOUNTER — Other Ambulatory Visit: Payer: Self-pay | Admitting: Cardiovascular Disease

## 2021-08-28 ENCOUNTER — Telehealth: Payer: Self-pay | Admitting: Gastroenterology

## 2021-08-28 DIAGNOSIS — K5900 Constipation, unspecified: Secondary | ICD-10-CM

## 2021-08-28 DIAGNOSIS — R1032 Left lower quadrant pain: Secondary | ICD-10-CM

## 2021-08-28 MED ORDER — ONDANSETRON 4 MG PO TBDP
4.0000 mg | ORAL_TABLET | Freq: Three times a day (TID) | ORAL | 0 refills | Status: DC | PRN
Start: 2021-08-28 — End: 2021-11-11

## 2021-08-28 MED ORDER — LINACLOTIDE 72 MCG PO CAPS: 72.0000 ug | ORAL_CAPSULE | Freq: Every day | ORAL | 2 refills | Status: DC

## 2021-08-28 NOTE — Telephone Encounter (Signed)
Returned call to patient. She states that she tried the different doses of Linzess and seems to tolerate the Linzess 72 mcg better and would like an RX. Pt wanted to inform you that she is still having LLQ pain. Reports that the pain is intermittent and at her waistline. She states that she mostly notices it with movement. She still has bloating and nausea. Nausea before and after a BM. Pt reports that she has a prescription for Phenergan but that does not seem to help much. She does not recall trying Zofran at all. She was not able to try the [place] since it is $40 and she has already spent a lot of money on medications. Please advise, thanks.  ?

## 2021-08-28 NOTE — Telephone Encounter (Signed)
Called pt and reviewed Dr. Doyne Keel recommendations. Pt would like prescriptions sent to pharmacy on file. Pt would also like to proceed with CT scan. She is aware that I have sent the prescription and placed the order for her CT scan. She knows to expect a call from radiology scheduling to set up her appt. I provided pt with the radiology # to call if she has not heard from scheduling by next week. Pt verbalized understanding and had no concerns at the end of the call. ? ?CT order in epic. Secure staff message sent to radiology scheduling to set up CT appt. ?

## 2021-08-28 NOTE — Telephone Encounter (Signed)
Okay thanks for the follow up. ?Can you place a prescription for Linzess 26mg / day, and also for Zofran 468mODT every 8 hours PRN #30 to see if that helps.  ?If she is not having any improvement in her pain despite treatment of constipation then I think okay to proceed with a CT abdomen / pelvis WITHOUT IV CONTRAST given her history of CKD. She has not had imaging in several years. Can you please coordinate if she is willing? Thanks ? ?

## 2021-08-28 NOTE — Telephone Encounter (Signed)
Patient was asked to call after trying Linzess.  She has tried both strengths and wishes to talk to you about it and some other issues she's having.  Please call.  Thank you. ?

## 2021-09-09 ENCOUNTER — Inpatient Hospital Stay: Payer: Medicare Other | Attending: Oncology

## 2021-09-09 ENCOUNTER — Inpatient Hospital Stay: Payer: Medicare Other

## 2021-09-09 ENCOUNTER — Other Ambulatory Visit: Payer: Self-pay

## 2021-09-09 ENCOUNTER — Inpatient Hospital Stay (HOSPITAL_BASED_OUTPATIENT_CLINIC_OR_DEPARTMENT_OTHER): Payer: Medicare Other | Admitting: Oncology

## 2021-09-09 VITALS — BP 151/74 | HR 78 | Temp 98.7°F | Resp 18 | Ht <= 58 in | Wt 187.0 lb

## 2021-09-09 DIAGNOSIS — D631 Anemia in chronic kidney disease: Secondary | ICD-10-CM

## 2021-09-09 DIAGNOSIS — N189 Chronic kidney disease, unspecified: Secondary | ICD-10-CM

## 2021-09-09 DIAGNOSIS — N183 Chronic kidney disease, stage 3 unspecified: Secondary | ICD-10-CM | POA: Diagnosis present

## 2021-09-09 LAB — CBC WITH DIFFERENTIAL (CANCER CENTER ONLY)
Abs Immature Granulocytes: 0.03 10*3/uL (ref 0.00–0.07)
Basophils Absolute: 0 10*3/uL (ref 0.0–0.1)
Basophils Relative: 0 %
Eosinophils Absolute: 0 10*3/uL (ref 0.0–0.5)
Eosinophils Relative: 1 %
HCT: 34.1 % — ABNORMAL LOW (ref 36.0–46.0)
Hemoglobin: 10.7 g/dL — ABNORMAL LOW (ref 12.0–15.0)
Immature Granulocytes: 1 %
Lymphocytes Relative: 8 %
Lymphs Abs: 0.5 10*3/uL — ABNORMAL LOW (ref 0.7–4.0)
MCH: 31.1 pg (ref 26.0–34.0)
MCHC: 31.4 g/dL (ref 30.0–36.0)
MCV: 99.1 fL (ref 80.0–100.0)
Monocytes Absolute: 0.5 10*3/uL (ref 0.1–1.0)
Monocytes Relative: 9 %
Neutro Abs: 4.8 10*3/uL (ref 1.7–7.7)
Neutrophils Relative %: 81 %
Platelet Count: 198 10*3/uL (ref 150–400)
RBC: 3.44 MIL/uL — ABNORMAL LOW (ref 3.87–5.11)
RDW: 14.8 % (ref 11.5–15.5)
WBC Count: 5.9 10*3/uL (ref 4.0–10.5)
nRBC: 0 % (ref 0.0–0.2)

## 2021-09-09 LAB — IRON AND TIBC
Iron: 87 ug/dL (ref 28–170)
Saturation Ratios: 29 % (ref 10.4–31.8)
TIBC: 305 ug/dL (ref 250–450)
UIBC: 218 ug/dL

## 2021-09-09 LAB — FERRITIN: Ferritin: 259 ng/mL (ref 11–307)

## 2021-09-09 MED ORDER — EPOETIN ALFA-EPBX 10000 UNIT/ML IJ SOLN
20000.0000 [IU] | Freq: Once | INTRAMUSCULAR | Status: AC
  Administered 2021-09-09: 20000 [IU] via SUBCUTANEOUS
  Filled 2021-09-09: qty 2

## 2021-09-09 MED ORDER — EPOETIN ALFA-EPBX 40000 UNIT/ML IJ SOLN
40000.0000 [IU] | Freq: Once | INTRAMUSCULAR | Status: AC
  Administered 2021-09-09: 40000 [IU] via SUBCUTANEOUS
  Filled 2021-09-09: qty 1

## 2021-09-09 NOTE — Progress Notes (Signed)
?[place] ?OFFICE PROGRESS NOTE ? ? ?Diagnosis: Anemia, chronic renal failure, GI bleeding ? ?INTERVAL HISTORY:  ? ?Tricia Gonzales returns as scheduled.  She continues every 6-week erythropoietin therapy.  She is taking iron.  No bleeding.  She reports left abdominal discomfort for the past 3 to 4 months.  She has constipation.  She saw [name] and is scheduled for a CT abdomen/pelvis this week. ? ?Objective: ? ?Vital signs in last 24 hours: ? ?Blood pressure (!) 151/74, pulse 78, temperature 98.7 ?F (37.1 ?C), temperature source Oral, resp. rate 18, height _0  (1.473 m), weight 187 lb (84.8 kg), SpO2 95 %. ?  ? ?Resp: End inspiratory rhonchi at the right posterior base, no respiratory distress ?Cardio: Regular rate and rhythm ?GI: No hepatosplenomegaly, no mass, mild tenderness in the left mid and lower abdomen, tender at the right anterior costal margin ?Vascular: The right lower leg is slightly larger than the left side  ? ? ?Lab Results: ? ?Lab Results  ?Component Value Date  ? WBC 5.9 09/09/2021  ? HGB 10.7 (L) 09/09/2021  ? HCT 34.1 (L) 09/09/2021  ? MCV 99.1 09/09/2021  ? PLT 198 09/09/2021  ? NEUTROABS 4.8 09/09/2021  ? ? ?CMP  ?Lab Results  ?Component Value Date  ? NA 138 07/31/2021  ? K 4.9 07/31/2021  ? CL 101 07/31/2021  ? CO2 29 07/31/2021  ? GLUCOSE 150 (H) 07/31/2021  ? BUN 67 (H) 07/31/2021  ? CREATININE 1.80 (H) 07/31/2021  ? CALCIUM 9.5 07/31/2021  ? PROT 6.7 11/12/2020  ? ALBUMIN 3.9 07/31/2021  ? AST 14 (L) 11/12/2020  ? ALT 9 11/12/2020  ? ALKPHOS 85 11/12/2020  ? BILITOT 0.6 11/12/2020  ? GFRNONAA 28 (L) 07/31/2021  ? GFRAA 24 (L) 08/05/2020  ? ? ? ?Medications: I have reviewed the patient's current medications. ? ? 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; quantitative immunoglobulins show IgG mildly decreased 654 (range [phone]), IgA and IgM in normal range; polyclonal serum light chain elevation ?11/15/2015-hemoglobin 11.2 ?11/29/2015-hemoglobin 11.1 ?12/12/2005-hemoglobin 10.4; Aranesp 100 ?g every 2 weeks initiated ?02/07/2016-hemoglobin 11.0, Aranesp held, changed to an every 3 week schedule ?Progressive anemia and decreased iron stores December 2017, treated with IV iron January 2018 with improvement ?She did not require Aranesp for 2 months and resumed Aranesp on a monthly schedule for 08/2016 ?Aranesp every 6 weeks beginning 05/04/2017 ?Progressive anemia and decreased iron stores 07/05/2017; IV iron  given 07/28/2017 and 08/06/2017 ?Persistent severe anemia, Aranesp change to every 2 weeks beginning 10/19/2017 ?Persistent anemia, decreased iron stores 11/30/2017 ?IV iron 12/13/2017, 12/20/2017, ?Decreased iron stores 05/17/2018  ?IV iron 06/24/2018 ?07/05/2018- no serum M spike, polyclonal serum light chain elevation, IgG mildly decreased with IgA and IgM within normal range  ?2 units of blood 08/17/2018 ?IV iron 08/17/2018 and 08/24/2018 ?Stool heme +08/30/2018, referred to GI ?2 units of blood 09/01/2018 ?1 unit of blood 10/25/2018 ?11/11/2018 colonoscopy- diverticulosis in the entire examined colon.  End-to-end colocolonic anastomosis.  One 5 mm polyp in the ascending colon.  No cause for anemia. ?11/11/2018 upper endoscopy- evidence of gastric antral vascular ectasia which is the likely cause of the patient's worsening anemia. ?11/24/2018 upper endoscopy-  severe gastric antral vascular ectasia with active oozing treated with argon plasma coagulation. ?Hemoglobin stabilized following treatment of the Gave ?Every 6-week erythropoietin 06/22/2019,  erythropoietin dose increased to 60,000 units 09/14/2019 ?Erythropoietin 60,000 units every 3 weeks 10/24/2019 ?Schedule changed to every 4 weeks 12/15/2019 ?IV iron 02/21/2020 and 02/29/2020, 06/25/2020, 07/03/2020 ?Erythropoietin adjusted to every 2 weeks beginning 05/23/2020 ?Erythropoietin changed to every 3 weeks beginning 11/12/2020 ?Erythropoietin continued every 3 weeks 01/14/2021 ?Erythropoietin changed to every 6 weeks 03/18/2021 ?  ?2.   Chronic renal insufficiency ?  ?3.    Atrial fibrillation-maintained on apixaban ?  ?4.    Osteoarthritis ?  ?5.     Diabetes ?  ?6.     Sjogren's disease ?  ?7.     Pericarditis February 2017 ?  ?8.     Colonoscopy 01/07/2012-prior segmental colectomy in the sigmoid colon; moderate diverticulosis throughout the colon; otherwise normal examination ?  ? ? ?Disposition: ?Tricia Gonzales continues treatment with erythropoietin and iron.  The hemoglobin is in goal range.  She will continue erythropoietin every 6 weeks. ?She sees [name] for chronic GI bleeding and abdominal pain.  She is scheduled for a CT later this week. ?We will follow-up on iron studies from today.  She will return for erythropoietin in 6 weeks.  She will be scheduled for an office visit in approximately 18 weeks. ? ? ?[name], MD ? ?09/09/2021  ?1:12 PM ? ? ?

## 2021-09-11 ENCOUNTER — Other Ambulatory Visit: Payer: Self-pay

## 2021-09-11 ENCOUNTER — Ambulatory Visit (HOSPITAL_COMMUNITY)
Admission: RE | Admit: 2021-09-11 | Discharge: 2021-09-11 | Disposition: A | Discharge status: Home / Self Care | Payer: Medicare Other | Source: Ambulatory Visit | Attending: Gastroenterology | Admitting: Gastroenterology

## 2021-09-11 DIAGNOSIS — R1032 Left lower quadrant pain: Secondary | ICD-10-CM | POA: Insufficient documentation

## 2021-09-11 DIAGNOSIS — K5900 Constipation, unspecified: Secondary | ICD-10-CM | POA: Diagnosis present

## 2021-09-12 ENCOUNTER — Other Ambulatory Visit: Payer: Self-pay | Admitting: *Deleted

## 2021-09-12 DIAGNOSIS — D631 Anemia in chronic kidney disease: Secondary | ICD-10-CM

## 2021-10-07 ENCOUNTER — Encounter: Payer: Self-pay | Admitting: Gastroenterology

## 2021-10-07 ENCOUNTER — Ambulatory Visit (INDEPENDENT_AMBULATORY_CARE_PROVIDER_SITE_OTHER): Payer: Medicare Other | Admitting: Gastroenterology

## 2021-10-07 VITALS — BP 144/80 | HR 60 | Ht <= 58 in | Wt 188.6 lb

## 2021-10-07 DIAGNOSIS — D649 Anemia, unspecified: Secondary | ICD-10-CM | POA: Diagnosis not present

## 2021-10-07 DIAGNOSIS — K429 Umbilical hernia without obstruction or gangrene: Secondary | ICD-10-CM

## 2021-10-07 DIAGNOSIS — K5909 Other constipation: Secondary | ICD-10-CM

## 2021-10-07 DIAGNOSIS — R109 Unspecified abdominal pain: Secondary | ICD-10-CM | POA: Diagnosis not present

## 2021-10-07 MED ORDER — DICYCLOMINE HCL 10 MG PO CAPS
10.0000 mg | ORAL_CAPSULE | Freq: Three times a day (TID) | ORAL | 3 refills | Status: AC | PRN
Start: 2021-10-07 — End: ?

## 2021-10-07 NOTE — Progress Notes (Signed)
? 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 ?  ?She is here for a follow-up visit for abdominal pain, constipation.  I last saw her in February when she was having this pain for she states several months.  She continues to have this periodically.  It is not constant, tends to come and go.  She continues to have problems with constipation.  She can have a bowel movement upwards of every 3 days at times.  I gave her some Linzess to use at the last visit.  She states this was very strong for her, "tears her up".  Even at 72.5 mcg/day she was having multiple bowel movements per day.  However with increased bowel movements she thinks her pain might have been a bit better and less frequent.  Because the Linzess is too strong she is been using MiraLAX dosed at once per day.  She will have a bowel movement anywhere from once daily to every 3 days.  If she walks more frequently she can have pain in her left mid abdomen.  She does feel reduction of bloating and perhaps her pain after bowel movement.  With her persistent symptoms she had a CT scan of her abdomen pelvis as outlined below March 24.  She has severe diverticulosis which we know about, no evidence of diverticulitis.  She had significant stool burden in her colon.  Also noted to have a periumbilical hernia with a loop of small bowel.  She states she does have discomfort from the hernia at times, is not persistent or severe issue.  At times when she does not have a bowel she states it can be tender.  She does have a rare discomfort at the site after eating but not frequently.  She is hoping to avoid any operation if at all possible.  I had recommended trial of IBgard on her last visit as well, she states it is too expensive, has not really had a good trial yet but does have it at home. ? ?Recall that she has anemia from a history of GAVE in the past  which has been ablated with endoscopy, last in 2022.  She is been on oral iron and her most recent CBC and iron studies look good.  She is followed by Dr. Malachy Mood. Her colonoscopy was done last in 2020 without any high risk lesions. ?  ?Prior workup: ?June 2020 EGD for treatment of GAVE ?-Normal esophagus. 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 Treated ?with argon plasma coagulation (APC). ?- Normal duodenal bulb and second portion of the duodenum. ?  ?May 2020 colonoscopy for anemia and heme positive stool.  ?-Diverticulosis in the entire examined colon. ?- End-to-end colo-colonic anastomosis, characterized by visible sutures. ?- One 5 mm polyp in the ascending colon, removed with a cold snare. Resected and retrieved. ?- Internal hemorrhoids. ?- The examination was otherwise normal. ?  ?Path = sessile serrated polyp ?  ?EGD 08/17/20 - Dr. Bryan Lemma  ?- Benign-appearing esophageal stenosis. Dilated with 20 mm TTS balloon with appropriate ?mucosal rent. ?- 2 cm hiatal hernia. ?- Gastric antral vascular ectasia. Treated with argon plasma coagulation (APC). ?- Normal gastric fundus and gastric body. ?- Normal examined duodenum. ?- No specimens collected. ?  ?Echo 08/16/20 - EF 50-55%, moderate pulm HTN ?  ? ?CT abdomen / pelvis without contrast 09/12/21: ?IMPRESSION: ?  1. No acute process identified. ?2. Severe colonic diverticulosis. Large amount of retained fecal ?material throughout the colon. ?3. Small right periumbilical hernia containing a loop of small ?bowel. ?4. Other chronic findings as described. ?  ? ?Past Medical History:  ?Diagnosis Date  ? Anemia   ? 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/2015  ? Anxiety   ? Arthritis   ? Atrial fibrillation ([place])   ? Avascular necrosis (HCC) hip left  ? leg pain also  ? Chronic back pain   ? Chronic kidney disease (CKD), stage III (moderate) (HCC)   ? Depression   ? Diverticulosis   ? Early cataracts, bilateral   ? Esophageal  stricture   ? Gastritis   ? GAVE (gastric antral vascular ectasia)   ? GERD (gastroesophageal reflux disease)   ? Glaucoma   ? Heart murmur   ?  " some doctors say that I have one some say that I don"t "  ? Hiatal hernia   ? History of blood transfusion   ? "@ least w/1st knee OR"  ? History of gout   ? History of kidney stones   ? Hyperlipidemia   ? Hypertension   ? Intestinal obstruction (HCC)   ? Kidney stones   ? Obesity   ? Osteoarthritis   ? Osteoporosis   ? Pericarditis 2016  ? Presence of permanent cardiac pacemaker   ? Sjogren's disease ([place])   ? Thyroid disease   ? Type II diabetes mellitus ([place])   ? Vitamin B12 deficiency   ? ? ? ?Past Surgical History:  ?Procedure Laterality Date  ? ABDOMINAL HYSTERECTOMY    ? complete  ? CARDIOVERSION N/A 07/31/2020  ? Procedure: CARDIOVERSION;  Surgeon: Werner Lean, MD;  Location: [facility];  Service: Cardiovascular;  Laterality: N/A;  ? CHOLECYSTECTOMY OPEN    ? 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 COLONOSCOPY  01/07/2012  ? Procedure: COLONOSCOPY;  Surgeon: Lafayette Dragon, MD;  Location: [facility];  Service: Endoscopy;  Laterality: N/A;  ? EP IMPLANTABLE DEVICE N/A 06/18/2015  ? Procedure: Pacemaker Implant;  Surgeon: Evans Lance, MD;  Location: [facility];  Service: Cardiovascular;  Laterality: N/A;  ? ESOPHAGEAL DILATION  08/17/2020  ? Procedure: ESOPHAGEAL DILATION;  Surgeon: Lavena Bullion, DO;  Location: [facility];  Service: Gastroenterology;;  ? ESOPHAGOGASTRODUODENOSCOPY N/A 11/24/2018  ? Procedure: ESOPHAGOGASTRODUODENOSCOPY (EGD);  Surgeon: Yetta Flock, MD;  Location: [facility];  Service: Gastroenterology;  Laterality: N/A;  ? ESOPHAGOGASTRODUODENOSCOPY (EGD) WITH ESOPHAGEAL DILATION    ? ESOPHAGOGASTRODUODENOSCOPY (EGD) WITH PROPOFOL N/A 08/12/2015  ? Procedure: ESOPHAGOGASTRODUODENOSCOPY (EGD) WITH PROPOFOL;  Surgeon: Manus Gunning, MD;  Location: [facility];  Service: Gastroenterology;  Laterality:  N/A;  ? ESOPHAGOGASTRODUODENOSCOPY (EGD) WITH PROPOFOL N/A 08/17/2020  ? Procedure: ESOPHAGOGASTRODUODENOSCOPY (EGD) WITH PROPOFOL;  Surgeon: Lavena Bullion, DO;  Location: [facility];  Service: Gastroenterology;  Laterality: N/A;  ? EYE SURGERY Bilateral   ? cataracts  ? HOT HEMOSTASIS N/A 11/24/2018  ? Procedure: HOT HEMOSTASIS (ARGON PLASMA COAGULATION/BICAP);  Surgeon: Yetta Flock, MD;  Location: [facility];  Service: Gastroenterology;  Laterality: N/A;  ? HOT HEMOSTASIS N/A 08/17/2020  ? Procedure: HOT HEMOSTASIS (ARGON PLASMA COAGULATION/BICAP);  Surgeon: Lavena Bullion, DO;  Location[facility];  Service: Gastroenterology;  Laterality: N/A;  ? I & D EXTREMITY Right 08/06/2016  ? Procedure: DEBRIDEMENT PIP RIGHT RING FINGER;  Surgeon: Daryll Brod, MD;  Location: [facility];  Service: Orthopedics;  Laterality: Right;  ? INSERT / REPLACE / REMOVE PACEMAKER    ? JOINT REPLACEMENT    ? KNEE ARTHROSCOPY Right   ? MASS EXCISION Right 08/06/2016  ? Procedure: EXCISION CYST;  Surgeon: Daryll Brod, MD;  Location: [place];  Service: Orthopedics;  Laterality: Right;  ? REVISION TOTAL KNEE ARTHROPLASTY Left   ? TOTAL KNEE ARTHROPLASTY Bilateral   ? ?Family History  ?Problem Relation Age of Onset  ? Diabetes Mother   ? Diabetes Brother   ? Hypertension Sister   ? Colon cancer Neg Hx   ? Heart attack Neg Hx   ? Stroke Neg Hx   ? Colon polyps Neg Hx   ? Esophageal cancer Neg Hx   ? Rectal cancer Neg Hx   ? Stomach cancer Neg Hx   ? ?Social History  ? ?Tobacco Use  ? Smoking status: Former  ?  Packs/day: 2.00  ?  Years: 3.00  ?  Pack years: 6.00  ?  Types: Cigarettes  ?  Quit date: 06/23/1983  ?  Years since quitting: 38.3  ? Smokeless tobacco: Never  ?Vaping Use  ? Vaping Use: Never used  ?Substance Use Topics  ? Alcohol use: Yes  ?  Alcohol/week: 2.0 standard drinks  ?  Types: 2 Glasses of wine per week  ?  Comment: occ  ? Drug use: No  ? ?Current Outpatient Medications  ?Medication Sig Dispense Refill  ? ACCU-CHEK  AVIVA PLUS test strip 1 each by Other route daily.     ? acetaminophen (TYLENOL) 650 MG CR tablet Take 1,300 mg by mouth every evening.    ? apixaban (ELIQUIS) 2.5 MG TABS tablet Take 1 tablet (2.5 mg t

## 2021-10-07 NOTE — Patient Instructions (Signed)
If you are age 80 or older, your body mass index should be between 23-30. Your Body mass index is 39.42 kg/m?Marland Kitchen If this is out of the aforementioned range listed, please consider follow up with your Primary Care Provider. ? ?If you are age 20 or younger, your body mass index should be between 19-25. Your Body mass index is 39.42 kg/m?Marland Kitchen If this is out of the aformentioned range listed, please consider follow up with your Primary Care Provider.  ? ?________________________________________________________ ? ?The [facility] providers would like to encourage you to use [facility] to communicate with providers for non-urgent requests or questions.  Due to long hold times on the telephone, sending your provider a message by [facility] may be a faster and more efficient way to get a response.  Please allow 48 business hours for a response.  Please remember that this is for non-urgent requests.  ?_______________________________________________________ ? ?Increase Miralax to twice a day. ? ?We have sent the following medications to your pharmacy for you to pick up at your convenience: ?Bentyl 10 mg: Take every 8 hours as needed ? ?Use IBGard as directed as needed. ? ?Thank you for entrusting me with your care and for choosing [employer], ?Dr. Belen Cellar ?  ?

## 2021-10-13 ENCOUNTER — Ambulatory Visit (INDEPENDENT_AMBULATORY_CARE_PROVIDER_SITE_OTHER): Payer: Medicare Other

## 2021-10-13 DIAGNOSIS — I442 Atrioventricular block, complete: Secondary | ICD-10-CM

## 2021-10-13 LAB — CUP PACEART REMOTE DEVICE CHECK
Battery Remaining Longevity: 28 mo
Battery Voltage: 2.95 V
Brady Statistic AP VP Percent: 97.74 %
Brady Statistic AP VS Percent: 0.09 %
Brady Statistic AS VP Percent: 2.01 %
Brady Statistic AS VS Percent: 0.16 %
Brady Statistic RA Percent Paced: 97.75 %
Brady Statistic RV Percent Paced: 99.7 %
Date Time Interrogation Session: 20230422151243
Implantable Lead Implant Date: 20161227
Implantable Lead Implant Date: 20161227
Implantable Lead Location: 753859
Implantable Lead Location: 753860
Implantable Lead Model: 5076
Implantable Lead Model: 5076
Implantable Pulse Generator Implant Date: 20161227
Lead Channel Impedance Value: 285 Ohm
Lead Channel Impedance Value: 304 Ohm
Lead Channel Impedance Value: 342 Ohm
Lead Channel Impedance Value: 361 Ohm
Lead Channel Pacing Threshold Amplitude: 0.625 V
Lead Channel Pacing Threshold Amplitude: 1.25 V
Lead Channel Pacing Threshold Pulse Width: 0.4 ms
Lead Channel Pacing Threshold Pulse Width: 0.4 ms
Lead Channel Sensing Intrinsic Amplitude: 1.625 mV
Lead Channel Sensing Intrinsic Amplitude: 1.625 mV
Lead Channel Sensing Intrinsic Amplitude: 20.5 mV
Lead Channel Sensing Intrinsic Amplitude: 20.5 mV
Lead Channel Setting Pacing Amplitude: 2 V
Lead Channel Setting Pacing Amplitude: 2.5 V
Lead Channel Setting Pacing Pulse Width: 0.4 ms
Lead Channel Setting Sensing Sensitivity: 2.8 mV

## 2021-10-21 ENCOUNTER — Inpatient Hospital Stay: Payer: Medicare Other | Attending: Oncology

## 2021-10-21 ENCOUNTER — Other Ambulatory Visit: Payer: Self-pay

## 2021-10-21 ENCOUNTER — Inpatient Hospital Stay: Payer: Medicare Other

## 2021-10-21 VITALS — BP 174/73 | HR 80 | Temp 97.8°F | Resp 18 | Ht <= 58 in | Wt 189.2 lb

## 2021-10-21 DIAGNOSIS — N183 Chronic kidney disease, stage 3 unspecified: Secondary | ICD-10-CM | POA: Insufficient documentation

## 2021-10-21 DIAGNOSIS — D631 Anemia in chronic kidney disease: Secondary | ICD-10-CM | POA: Diagnosis present

## 2021-10-21 LAB — CBC WITH DIFFERENTIAL (CANCER CENTER ONLY)
Abs Immature Granulocytes: 0.02 10*3/uL (ref 0.00–0.07)
Basophils Absolute: 0 10*3/uL (ref 0.0–0.1)
Basophils Relative: 0 %
Eosinophils Absolute: 0 10*3/uL (ref 0.0–0.5)
Eosinophils Relative: 1 %
HCT: 32.5 % — ABNORMAL LOW (ref 36.0–46.0)
Hemoglobin: 10.2 g/dL — ABNORMAL LOW (ref 12.0–15.0)
Immature Granulocytes: 0 %
Lymphocytes Relative: 8 %
Lymphs Abs: 0.5 10*3/uL — ABNORMAL LOW (ref 0.7–4.0)
MCH: 31.4 pg (ref 26.0–34.0)
MCHC: 31.4 g/dL (ref 30.0–36.0)
MCV: 100 fL (ref 80.0–100.0)
Monocytes Absolute: 0.5 10*3/uL (ref 0.1–1.0)
Monocytes Relative: 9 %
Neutro Abs: 4.7 10*3/uL (ref 1.7–7.7)
Neutrophils Relative %: 82 %
Platelet Count: 194 10*3/uL (ref 150–400)
RBC: 3.25 MIL/uL — ABNORMAL LOW (ref 3.87–5.11)
RDW: 15.3 % (ref 11.5–15.5)
WBC Count: 5.8 10*3/uL (ref 4.0–10.5)
nRBC: 0 % (ref 0.0–0.2)

## 2021-10-21 LAB — RENAL FUNCTION PANEL
Albumin: 3.9 g/dL (ref 3.5–5.0)
Anion gap: 9 (ref 5–15)
BUN: 69 mg/dL — ABNORMAL HIGH (ref 8–23)
CO2: 31 mmol/L (ref 22–32)
Calcium: 9.3 mg/dL (ref 8.9–10.3)
Chloride: 97 mmol/L — ABNORMAL LOW (ref 98–111)
Creatinine, Ser: 1.82 mg/dL — ABNORMAL HIGH (ref 0.44–1.00)
GFR, Estimated: 28 mL/min — ABNORMAL LOW (ref >60)
Glucose, Bld: 187 mg/dL — ABNORMAL HIGH (ref 70–99)
Phosphorus: 4.7 mg/dL — ABNORMAL HIGH (ref 2.5–4.6)
Potassium: 4.7 mmol/L (ref 3.5–5.1)
Sodium: 137 mmol/L (ref 135–145)

## 2021-10-21 MED ORDER — EPOETIN ALFA-EPBX 40000 UNIT/ML IJ SOLN
40000.0000 [IU] | Freq: Once | INTRAMUSCULAR | Status: AC
  Administered 2021-10-21: 40000 [IU] via SUBCUTANEOUS
  Filled 2021-10-21: qty 1

## 2021-10-21 MED ORDER — EPOETIN ALFA-EPBX 10000 UNIT/ML IJ SOLN
20000.0000 [IU] | Freq: Once | INTRAMUSCULAR | Status: AC
  Administered 2021-10-21: 20000 [IU] via SUBCUTANEOUS
  Filled 2021-10-21: qty 2

## 2021-10-21 NOTE — Patient Instructions (Signed)
Epoetin Alfa injection ?What is this medication? ?EPOETIN ALFA (e POE e tin AL fa) helps your body make more red blood cells. This medicine is used to treat anemia caused by chronic kidney disease, cancer chemotherapy, or HIV-therapy. It may also be used before surgery if you have anemia. ?This medicine may be used for other purposes; ask your health care provider or pharmacist if you have questions. ?COMMON BRAND NAME(S): Epogen, Procrit, Retacrit ?What should I tell my care team before I take this medication? ?They need to know if you have any of these conditions: ?cancer ?heart disease ?high blood pressure ?history of blood clots ?history of stroke ?low levels of folate, iron, or vitamin B12 in the blood ?seizures ?an unusual or allergic reaction to erythropoietin, albumin, benzyl alcohol, hamster proteins, other medicines, foods, dyes, or preservatives ?pregnant or trying to get pregnant ?breast-feeding ?How should I use this medication? ?This medicine is for injection into a vein or under the skin. It is usually given by a health care professional in a hospital or clinic setting. ?If you get this medicine at home, you will be taught how to prepare and give this medicine. Use exactly as directed. Take your medicine at regular intervals. Do not take your medicine more often than directed. ?It is important that you put your used needles and syringes in a special sharps container. Do not put them in a trash can. If you do not have a sharps container, call your pharmacist or healthcare provider to get one. ?A special MedGuide will be given to you by the pharmacist with each prescription and refill. Be sure to read this information carefully each time. ?Talk to your pediatrician regarding the use of this medicine in children. While this drug may be prescribed for selected conditions, precautions do apply. ?Overdosage: If you think you have taken too much of this medicine contact a poison control center or emergency  room at once. ?NOTE: This medicine is only for you. Do not share this medicine with others. ?What if I miss a dose? ?If you miss a dose, take it as soon as you can. If it is almost time for your next dose, take only that dose. Do not take double or extra doses. ?What may interact with this medication? ?Interactions have not been studied. ?This list may not describe all possible interactions. Give your health care provider a list of all the medicines, herbs, non-prescription drugs, or dietary supplements you use. Also tell them if you smoke, drink alcohol, or use illegal drugs. Some items may interact with your medicine. ?What should I watch for while using this medication? ?Your condition will be monitored carefully while you are receiving this medicine. ?You may need blood work done while you are taking this medicine. ?This medicine may cause a decrease in vitamin B6. You should make sure that you get enough vitamin B6 while you are taking this medicine. Discuss the foods you eat and the vitamins you take with your health care professional. ?What side effects may I notice from receiving this medication? ?Side effects that you should report to your doctor or health care professional as soon as possible: ?allergic reactions like skin rash, itching or hives, swelling of the face, lips, or tongue ?seizures ?signs and symptoms of a blood clot such as breathing problems; changes in vision; chest pain; severe, sudden headache; pain, swelling, warmth in the leg; trouble speaking; sudden numbness or weakness of the face, arm or leg ?signs and symptoms of a stroke like  changes in vision; confusion; trouble speaking or understanding; severe headaches; sudden numbness or weakness of the face, arm or leg; trouble walking; dizziness; loss of balance or coordination ?Side effects that usually do not require medical attention (report to your doctor or health care professional if they continue or are  bothersome): ?chills ?cough ?dizziness ?fever ?headaches ?joint pain ?muscle cramps ?muscle pain ?nausea, vomiting ?pain, redness, or irritation at site where injected ?This list may not describe all possible side effects. Call your doctor for medical advice about side effects. You may report side effects to FDA at 1-800-FDA-1088. ?Where should I keep my medication? ?Keep out of the reach of children. ?Store in a refrigerator between 2 and 8 degrees C (36 and 46 degrees F). Do not freeze or shake. Throw away any unused portion if using a single-dose vial. Multi-dose vials can be kept in the refrigerator for up to 21 days after the initial dose. Throw away unused medicine. ?NOTE: This sheet is a summary. It may not cover all possible information. If you have questions about this medicine, talk to your doctor, pharmacist, or health care provider. ?? 2023 Elsevier/Gold Standard (2017-02-09 00:00:00) ? ?

## 2021-10-23 LAB — ERYTHROPOIETIN: Erythropoietin: 19.2 m[IU]/mL — ABNORMAL HIGH (ref 2.6–18.5)

## 2021-10-28 NOTE — Progress Notes (Signed)
Remote pacemaker transmission.  ? ?

## 2021-10-31 NOTE — Progress Notes (Signed)
Office Visit Note  Patient: Tricia Gonzales             Date of Birth: 04/09/1941           MRN: [id number]             PCP: Carol Ada, MD Referring: Carol Ada, MD Visit Date: 11/13/2021 Occupation: _0 @  Subjective:  Pain in multiple joints  History of Present Illness: Tricia Gonzales is a 81 y.o. female seen in consultation per request of her PCP for evaluation of arthritis.  Patient states she was diagnosed with Sjogren's syndrome by Dr. Netta Neat many years ago.  She had did not receive any treatment for autoimmune disease.  She continues to use over-the-counter products for dry mouth and dry eyes.  She recently had lacrimal plugging which was helpful.  She states she also has glaucoma.  She has history of osteoarthritis for multiple years.  She has progressive worsening of osteoarthritis in her hands and is difficult for her to grip objects and do routine activities.  She had bilateral total knee replacements several years ago.  She continues to have discomfort in her knee joints.  She states that she had cortisone injections to her spine in the past for degenerative disc disease for many years.  She was diagnosed with left hip AVN in 2019 and she underwent left total hip replacement in November 2019.  She also has discomfort in her bilateral feet due to underlying arthritis.  She continues to have discomfort in her neck, lower back, shoulders, hands, knees and her feet.  She has not noticed any joint swelling.  She notices pedal edema.  She also was diagnosed with gout many years ago.  She states she takes Uloric 40 mg 3 times a week for the last 8 years at least.  She has not had any gout flares.  She was diagnosed with osteoporosis several years ago.  She was initially on Fosamax and then switched to Prolia injections.  She states she stopped Prolia injections in November 2019 after she was diagnosed with AVN and she got concerned about the side effects of Prolia.  She has occasional  numbness in her hands.  She has numbness in her feet and has been told that she has diabetic neuropathy.  Activities of Daily Living:  Patient reports morning stiffness for 2 hours.   Patient Reports nocturnal pain.  Difficulty dressing/grooming: Reports Difficulty climbing stairs: Reports Difficulty getting out of chair: Reports Difficulty using hands for taps, buttons, cutlery, and/or writing: Reports  Review of Systems  Constitutional:  Positive for fatigue.  HENT:  Positive for mouth dryness. Negative for mouth sores.   Eyes:  Positive for dryness.  Respiratory:  Positive for shortness of breath.        Related to heart disease.  Cardiovascular:  Positive for swelling in legs/feet.  Gastrointestinal:  Positive for constipation and diarrhea.  Endocrine: Positive for cold intolerance and excessive thirst.  Genitourinary:  Positive for difficulty urinating.  Musculoskeletal:  Positive for joint pain, gait problem, joint pain, muscle weakness and morning stiffness. Negative for joint swelling and muscle tenderness.  Skin:  Negative for color change, rash and sensitivity to sunlight.  Allergic/Immunologic: Negative for susceptible to infections.  Neurological:  Positive for numbness and weakness.  Hematological:  Positive for bruising/bleeding tendency. Negative for swollen glands.  Psychiatric/Behavioral:  Positive for depressed mood and sleep disturbance. The patient is nervous/anxious.    PMFS History:  Patient Active Problem List  Diagnosis Date Noted   Benign esophageal stricture    Acute blood loss anemia    Symptomatic anemia    Acute diastolic heart failure ([place]) 08/15/2020   AKI (acute kidney injury) ([place]) 08/15/2020   CHF (congestive heart failure) ([place]) 08/15/2020   Atrial flutter (HCC)    Sinus node dysfunction ([place]) 09/06/2019   Status post left hip replacement 06/28/2019   Urinary retention 05/12/2019   Polyneuropathy associated with underlying disease ([place])  05/12/2019   Immobility 04/26/2019   Low ferritin 04/24/2019   Primary osteoarthritis of left hip 03/29/2019   GAVE (gastric antral vascular ectasia)    Avascular necrosis of bone of hip, left ([place]) 08/22/2018   Vitamin B12 deficiency    Type II diabetes mellitus ([place])    Sjogren's disease ([place])    Presence of permanent cardiac pacemaker    Pericarditis    Osteoarthritis    Obesity    Kidney stones    Intestinal obstruction ([place])    History of gout    Hiatal hernia    History of blood transfusion    Heart murmur    GERD (gastroesophageal reflux disease)    Gastritis    Esophageal stricture    Early cataracts, bilateral    Diverticulosis    Depression    Chronic kidney disease (CKD), stage III (moderate) (HCC)    Chronic back pain    Atrial fibrillation (HCC)    Arthritis    Anxiety    Anemia    Osteoarthritis of finger of right hand 01/22/2016   Pain 01/22/2016   Primary osteoarthritis of both first carpometacarpal joints 01/22/2016   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/2015   Chest pain at rest    Heme positive stool    UTI (urinary tract infection) 08/09/2015   Acute pericarditis    Chest pain 08/06/2015   Diabetes mellitus type 2, controlled ([place]) 08/06/2015   Hyperlipidemia 08/06/2015   Hypertension 08/06/2015   CKD (chronic kidney disease) stage 3, GFR 30-59 ml/min (HCC) 08/06/2015   Chronic anemia 08/06/2015   Hyperkalemia    Pain in the chest    Pacemaker    Junctional bradycardia 06/17/2015   Osteoporosis 03/22/2015   Abdominal pain, left lower quadrant 01/07/2012   Syncope 09/15/2011   Junctional escape rhythm 09/15/2011   PELVIC MASS 03/27/2009   ABSCESS OF INTESTINE 02/19/2009   VITAMIN B12 DEFICIENCY 11/21/2007   OBESITY 11/21/2007   ESOPHAGEAL STRICTURE 11/21/2007   HIATAL HERNIA 11/21/2007   UNSPECIFIED INTESTINAL OBSTRUCTION 11/21/2007   DIVERTICULOSIS OF COLON 11/21/2007   OSTEOARTHRITIS 11/21/2007   DIABETES MELLITUS, HX OF 11/21/2007    ANEMIA, HX OF 11/21/2007   HYPERTENSION, HX OF 11/21/2007   REFLUX ESOPHAGITIS, HX OF 11/21/2007    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   Anxiety    Arthritis    Atrial fibrillation ([place])    Avascular necrosis ([place]) hip left   leg pain also   Chronic back pain    Chronic kidney disease (CKD), stage III (moderate) (HCC)    Depression    Diverticulosis    Early cataracts, bilateral    Esophageal stricture    Gastritis    GAVE (gastric antral vascular ectasia)    GERD (gastroesophageal reflux disease)    Glaucoma    Heart murmur     " some doctors say that I have one some say that I don"t "   Hiatal hernia  History of blood transfusion    "@ least w/1st knee OR"   History of gout    History of kidney stones    Hyperlipidemia    Hypertension    Intestinal obstruction (HCC)    Kidney stones    Obesity    Osteoarthritis    Osteoporosis    Pericarditis 2016   Presence of permanent cardiac pacemaker    Sjogren's disease ([place])    Thyroid disease    Type II diabetes mellitus ([place])    Vitamin B12 deficiency     Family History  Problem Relation Age of Onset   Diabetes Mother    Hypertension Sister    Diabetes Brother    Colon cancer Neg Hx    Heart attack Neg Hx    Stroke Neg Hx    Colon polyps Neg Hx    Esophageal cancer Neg Hx    Rectal cancer Neg Hx    Stomach cancer Neg Hx    Past Surgical History:  Procedure Laterality Date   ABDOMINAL HYSTERECTOMY     complete   CARDIOVERSION N/A 07/31/2020   Procedure: CARDIOVERSION;  Surgeon: Werner Lean, MD;  Location: [facility];  Service: Cardiovascular;  Laterality: N/A;   CHOLECYSTECTOMY OPEN     COLECTOMY     for rectovaginal fistula   COLONOSCOPY  01/07/2012   Procedure: COLONOSCOPY;  Surgeon: Lafayette Dragon, MD;  Location: [facility];  Service: Endoscopy;  Laterality: N/A;   EP IMPLANTABLE DEVICE N/A 06/18/2015   Procedure: Pacemaker  Implant;  Surgeon: Evans Lance, MD;  Location: [facility];  Service: Cardiovascular;  Laterality: N/A;   ESOPHAGEAL DILATION  08/17/2020   Procedure: ESOPHAGEAL DILATION;  Surgeon: Lavena Bullion, DO;  Location: [facility];  Service: Gastroenterology;;   ESOPHAGOGASTRODUODENOSCOPY N/A 11/24/2018   Procedure: ESOPHAGOGASTRODUODENOSCOPY (EGD);  Surgeon: Yetta Flock, MD;  Location: [facility];  Service: Gastroenterology;  Laterality: N/A;   ESOPHAGOGASTRODUODENOSCOPY (EGD) WITH ESOPHAGEAL DILATION     ESOPHAGOGASTRODUODENOSCOPY (EGD) WITH PROPOFOL N/A 08/12/2015   Procedure: ESOPHAGOGASTRODUODENOSCOPY (EGD) WITH PROPOFOL;  Surgeon: Manus Gunning, MD;  Location: [facility];  Service: Gastroenterology;  Laterality: N/A;   ESOPHAGOGASTRODUODENOSCOPY (EGD) WITH PROPOFOL N/A 08/17/2020   Procedure: ESOPHAGOGASTRODUODENOSCOPY (EGD) WITH PROPOFOL;  Surgeon: Lavena Bullion, DO;  Location: [facility];  Service: Gastroenterology;  Laterality: N/A;   EYE SURGERY Bilateral    cataracts   HOT HEMOSTASIS N/A 11/24/2018   Procedure: HOT HEMOSTASIS (ARGON PLASMA COAGULATION/BICAP);  Surgeon: Yetta Flock, MD;  Location: [facility];  Service: Gastroenterology;  Laterality: N/A;   HOT HEMOSTASIS N/A 08/17/2020   Procedure: HOT HEMOSTASIS (ARGON PLASMA COAGULATION/BICAP);  Surgeon: Lavena Bullion, DO;  Location: [facility];  Service: Gastroenterology;  Laterality: N/A;   I & D EXTREMITY Right 08/06/2016   Procedure: DEBRIDEMENT PIP RIGHT RING FINGER;  Surgeon: Daryll Brod, MD;  Location: [facility];  Service: Orthopedics;  Laterality: Right;   INSERT / REPLACE / REMOVE PACEMAKER     JOINT REPLACEMENT     KNEE ARTHROSCOPY Right    MASS EXCISION Right 08/06/2016   Procedure: EXCISION CYST;  Surgeon: Daryll Brod, MD;  Location: [facility];  Service: Orthopedics;  Laterality: Right;   REVISION TOTAL KNEE ARTHROPLASTY Left    TOTAL KNEE ARTHROPLASTY Bilateral    Social History    Social History Narrative   Not on file   Immunization History  Administered Date(s) Administered   Influenza Split 03/27/2008, 04/26/2009, 04/07/2011   Influenza, High  Dose Seasonal PF 03/22/2012, 03/15/2014, 03/19/2015, 03/23/2016, 03/22/2017, 03/16/2018, 03/07/2019, 04/16/2020   Influenza-Unspecified 03/23/2015, 04/05/2017   PFIZER(Purple Top)SARS-COV-2 Vaccination 07/17/2019, 08/07/2019, 04/10/2020   Pfizer Covid-19 Vaccine Bivalent Booster 67yr & up 05/07/2021   Pneumococcal Conjugate-13 08/01/2014   Pneumococcal Polysaccharide-23 03/27/2004, 03/22/2012   Td 09/26/2007   Tdap 12/28/2012   Zoster Recombinat (Shingrix) 02/01/2018   Zoster, Live 02/02/2012, 02/01/2018, 05/13/2018     Objective: Vital Signs: BP (!) 142/81 (BP Location: Right Arm, Patient Position: Sitting, Cuff Size: Large)   Pulse 63   Resp 12   Ht _0  (1.473 m)   Wt 188 lb 3.4 oz (85.4 kg)   BMI 39.34 kg/m    Physical Exam Vitals and nursing note reviewed.  Constitutional:      Appearance: She is well-developed.  HENT:     Head: Normocephalic and atraumatic.  Eyes:     Conjunctiva/sclera: Conjunctivae normal.  Cardiovascular:     Rate and Rhythm: Normal rate and regular rhythm.     Heart sounds: Normal heart sounds.  Pulmonary:     Effort: Pulmonary effort is normal.     Breath sounds: Normal breath sounds.  Abdominal:     General: Bowel sounds are normal.     Palpations: Abdomen is soft.  Musculoskeletal:     Cervical back: Normal range of motion.  Lymphadenopathy:     Cervical: No cervical adenopathy.  Skin:    General: Skin is warm and dry.     Capillary Refill: Capillary refill takes less than 2 seconds.  Neurological:     Mental Status: She is alert and oriented to person, place, and time.  Psychiatric:        Behavior: Behavior normal.     Musculoskeletal Exam: She had limited range of motion of the cervical spine.  She had thoracic kyphosis.  She had limited range of motion of  the lumbar spine which was difficult to assess due to limited mobility.  Right shoulder joint was in good range of motion.  Left shoulder joint had limited forward flexion and abduction.  Elbow joints and wrist joints with good range of motion.  She had bilateral CMC thickening, PIP and DIP thickening with subluxation of several of her PIP and DIP joints.  Hip joints have limited range of motion without discomfort.  Bilateral knee joints.  Replaced with some warmth on palpation.  She has significant pedal edema over the right lower extremity.  There was no tenderness over ankles or MTPs.  CDAI Exam: CDAI Score: -- Patient Global: --; Provider Global: -- Swollen: --; Tender: -- Joint Exam 11/13/2021   No joint exam has been documented for this visit   There is currently no information documented on the homunculus. Go to the Rheumatology activity and complete the homunculus joint exam.  Investigation: No additional findings.  Imaging: No results found.  Recent Labs: Lab Results  Component Value Date   WBC 5.8 10/21/2021   HGB 10.2 (L) 10/21/2021   PLT 194 10/21/2021   NA 137 10/21/2021   K 4.7 10/21/2021   CL 97 (L) 10/21/2021   CO2 31 10/21/2021   GLUCOSE 187 (H) 10/21/2021   BUN 69 (H) 10/21/2021   CREATININE 1.82 (H) 10/21/2021   BILITOT 0.6 11/12/2020   ALKPHOS 85 11/12/2020   AST 14 (L) 11/12/2020   ALT 9 11/12/2020   PROT 6.7 11/12/2020   ALBUMIN 3.9 10/21/2021   CALCIUM 9.3 10/21/2021   GFRAA 24 (L) 08/05/2020    Speciality  Comments: No specialty comments available.  Procedures:  No procedures performed Allergies: Norvasc [amlodipine besylate], Other, Amlodipine besylate, Avapro [irbesartan], Glucophage [metformin hydrochloride], Lisinopril, Losartan, Morphine and related, Simvastatin, and Trulicity [dulaglutide]   Assessment / Plan:     Visit Diagnoses: Polyarthralgia -patient complains of pain and discomfort in multiple joints.  She complains of pain in her  cervical spine, lumbar spine, left shoulder, bilateral hands, bilateral knees, bilateral feet.  She denies any history of joint swelling.  Sjogren's syndrome with other organ involvement [facility]) -patient was diagnosed with Sjogren's syndrome by Dr. Elevated in the past and then she became a patient of Dr. Trudie Reed.  Patient states there was nothing to offer and then she started seeing her primary care physician.  She continues to have sicca symptoms with dry mouth and dry eyes.  She has been using over-the-counter products.  She had recent lacrimal plugging which was helpful.  (ANA 1:160 speckled with SSA+ and SSB+ in June 2008. October 2009-ANA negative, SSA+, SSB+).  Detailed counsel regarding Sjogren's syndrome was provided.  Over-the-counter products were discussed.  She denies any history of shortness of breath.  Association of ILD with Sjogren's, arrhythmias with Sjogren's and increased risk of lymphoma with Sjogren's was discussed.  High risk medication use -she was treated with Plaquenil in the past which she discontinued due to hair loss   Primary osteoarthritis of both hands -she complains of pain and discomfort in her bilateral hands.  She has severe osteoarthritis with PIP and DIP subluxation.  She has decreased grip strength.  Joint protection muscle strengthening was discussed.  Plan: XR Hand 2 View Right, XR Hand 2 View Left.  X-rays obtained today showed severe CMC narrowing and subluxation, DIP and PIP narrowing and subluxation.  These findings are consistent with severe erosive osteoarthritis.  Avascular necrosis of bone of hip, left (HCC) -patient states that she had frequent cortisone injections in her lumbar spine in the past.  She was told that she developed left hip joint AVN due to frequent steroid injections.  Status post left hip replacement-according the patient she underwent left total hip replacement in November 2019.  She denies any discomfort.  She had good range of  motion.  Status post total bilateral knee replacement-she had bilateral total knee replacements in the past.  She continues to have discomfort in her knee joints.  Some warmth was noted on palpation.  Primary osteoarthritis of both feet -she complains of discomfort in her feet.  She had edema on palpation of bilateral lower extremities.  X-rays were consistent with severe osteoarthritis.  No erosive changes were noted.  Plan: XR Foot 2 Views Right, XR Foot 2 Views Left  Neck pain-patient gives history of degenerative disc disease and chronic back pain.  DDD (degenerative disc disease), lumbar -I reviewed CT scan of her lumbar spine from 2020 which showed multilevel spondylosis, moderate to severe spinal stenosis and facet joint arthropathy.  She has had several cortisone injections in the past.  She continues to have lower back pain.  History of gout-she gives history of gout.  She states she has been on Uloric 40 mg 3 times a week.  She has not had any gout flares.  Age-related osteoporosis without current pathological fracture - ttd withFosamax and later Prolia in the past.  She stopped Prolia in 2019 after she developed AVN of the left hip.  Patient was treated by Dr. Cruzita Lederer in the past.  Patient has not made an appointment with Dr. Cruzita Lederer.  She has lost height and has severe thoracic kyphosis.I advised her to schedule an appointment with Dr. Cruzita Lederer.  Stage 3 chronic kidney disease, unspecified whether stage 3a or 3b CKD (HCC)-her GFR is in the 20s.  Other medical problems are listed as follows:  Primary hypertension-blood pressure was elevated today.  Paroxysmal atrial fibrillation (HCC)  Sinus node dysfunction (HCC)  Pacemaker  Acute diastolic heart failure (HCC)  Controlled type 2 diabetes mellitus with diabetic polyneuropathy, with long-term current use of insulin (HCC)  Polyneuropathy associated with underlying disease ([place])  GAVE (gastric antral vascular ectasia)  Hiatal  hernia  History of gastroesophageal reflux (GERD)  Diverticulosis  Benign esophageal stricture  Anemia in chronic kidney disease, unspecified CKD stage  Vitamin B12 deficiency  Anxiety and depression  History of glaucoma  Orders: Orders Placed This Encounter  Procedures   XR Hand 2 View Right   XR Hand 2 View Left   XR Foot 2 Views Right   XR Foot 2 Views Left   No orders of the defined types were placed in this encounter.   Face-to-face time spent with patient was 55 minutes. Greater than 50% of time was spent in counseling and coordination of care.  Follow-Up Instructions: Return for Osteoarthritis, Sjogren's.   Bo Merino, MD  Note - This record has been created using Editor, commissioning.  Chart creation errors have been sought, but may not always  have been located. Such creation errors do not reflect on  the standard of medical care.

## 2021-11-10 ENCOUNTER — Ambulatory Visit (INDEPENDENT_AMBULATORY_CARE_PROVIDER_SITE_OTHER): Payer: Medicare Other | Admitting: Internal Medicine

## 2021-11-10 ENCOUNTER — Encounter: Payer: Self-pay | Admitting: Internal Medicine

## 2021-11-10 ENCOUNTER — Other Ambulatory Visit: Payer: Self-pay | Admitting: Gastroenterology

## 2021-11-10 VITALS — BP 150/72 | HR 84 | Ht 58.5 in | Wt 188.0 lb

## 2021-11-10 DIAGNOSIS — I4819 Other persistent atrial fibrillation: Secondary | ICD-10-CM

## 2021-11-10 DIAGNOSIS — I5032 Chronic diastolic (congestive) heart failure: Secondary | ICD-10-CM

## 2021-11-10 DIAGNOSIS — I442 Atrioventricular block, complete: Secondary | ICD-10-CM | POA: Diagnosis not present

## 2021-11-10 NOTE — Progress Notes (Signed)
HPI [name] returns today for evaluation of sinus node dysfunction s/p PPM insertion. She has morbid obesity and had trouble with ambulation and was wheel chair bound. Last year she finally started walking again. She also has HTN, diastolic CHF and chronic renal insufficiency. No syncope. She has a remote h/o atrial fib. She has continued to slowly lose weight. She was down almost a 100 lbs. When I saw her last year and since then she has gained back about 8 lbs. She feels well except for difficulty with walking and uses a cane.  Allergies  Allergen Reactions   Norvasc [Amlodipine Besylate] Shortness Of Breath   Other Rash and Other (See Comments)    Pt voiced she is allergic to microfiber materials/ blankets/sheets!!   Amlodipine Besylate     Other reaction(s): SOB   Avapro [Irbesartan] Other (See Comments)    Headaches   Glucophage [Metformin Hydrochloride] Other (See Comments)    "increases creatinine" and diarrhea   Lisinopril     Headache    Losartan Other (See Comments)    Headaches    Morphine And Related Other (See Comments)    "Hallucinations"   Simvastatin Other (See Comments)    Body aches   Trulicity [Dulaglutide] Other (See Comments)    Severe mood swings     Current Outpatient Medications  Medication Sig Dispense Refill   ACCU-CHEK AVIVA PLUS test strip 1 each by Other route daily.      acetaminophen (TYLENOL) 650 MG CR tablet Take 1,300 mg by mouth every evening.     apixaban (ELIQUIS) 2.5 MG TABS tablet Take 1 tablet (2.5 mg total) by mouth 2 (two) times daily. 180 tablet 1   BD PEN NEEDLE NANO U/F 32G X 4 MM MISC See admin instructions.     brimonidine (ALPHAGAN) 0.2 % ophthalmic solution Place 1 drop into the right eye 2 (two) times daily.     carvedilol (COREG) 3.125 MG tablet TAKE TWO TABLETS IN THE MORNING AND TAKEONE TABLET IN THE EVENING 90 tablet 9   Cholecalciferol (VITAMIN D3) 25 MCG (1000 UT) CAPS Take 1,000 Units by mouth daily.      cycloSPORINE (RESTASIS) 0.05 % ophthalmic emulsion Place 1 drop into both eyes 2 (two) times daily.     dicyclomine (BENTYL) 10 MG capsule Take 1 capsule (10 mg total) by mouth every 8 (eight) hours as needed for spasms. 30 capsule 3   febuxostat (ULORIC) 40 MG tablet Take 1 tablet (40 mg total) by mouth every Monday, Wednesday, and Friday. 12 tablet 0   ferrous sulfate 325 (65 FE) MG EC tablet Take 1 tablet (325 mg total) by mouth 2 (two) times daily. (Patient taking differently: Take 325 mg by mouth daily with breakfast.) 60 tablet 3   gabapentin (NEURONTIN) 400 MG capsule Take 1 capsule (400 mg total) by mouth at bedtime. 30 capsule 0   levothyroxine (SYNTHROID) 75 MCG tablet Take 75 mcg by mouth every morning.     lidocaine (LIDODERM) 5 % Place 1-3 patches onto the skin See admin instructions. Apply 1-3 patches transdermally every 24 hours as needed for pain and remove & discard patches within 12 hours or as directed by MD     montelukast (SINGULAIR) 10 MG tablet Take 1 tablet (10 mg total) by mouth at bedtime. 30 tablet 0   nystatin (NYAMYC) powder APPLY TOPICALLY TO LEFT ABDOMINAL FOLD AND GROIN TWICE A DAY 15 g 0   ondansetron (ZOFRAN-ODT) 4 MG  disintegrating tablet Take 1 tablet (4 mg total) by mouth every 8 (eight) hours as needed for nausea or vomiting. 30 tablet 0   pantoprazole (PROTONIX) 40 MG tablet Take 40 mg by mouth daily.     pentoxifylline (TRENTAL) 400 MG CR tablet TAKE ONE TABLET EACH DAY WITH A MEAL     Polyethylene Glycol 3350 (MIRALAX PO) Take 17 g by mouth in the morning and at bedtime.     rOPINIRole (REQUIP) 0.5 MG tablet Take 0.5 mg by mouth at bedtime.     rosuvastatin (CRESTOR) 5 MG tablet Take 1 tablet (5 mg total) by mouth daily. (Patient taking differently: Take 5 mg by mouth at bedtime.) 30 tablet 0   sennosides-docusate sodium (SENOKOT-S) 8.6-50 MG tablet 1 tablet     torsemide (DEMADEX) 20 MG tablet Take 1 tablet (20 mg total) by mouth daily. 90 tablet 3    traMADol (ULTRAM) 50 MG tablet Take 50 mg by mouth See admin instructions. Take 50 mg by mouth at bedtime and an additional 50 mg once a day as needed for pain     TRESIBA FLEXTOUCH 100 UNIT/ML FlexTouch Pen Inject 10 Units into the skin every evening.     No current facility-administered medications for this visit.     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   Anxiety    Arthritis    Atrial fibrillation ([place])    Avascular necrosis (HCC) hip left   leg pain also   Chronic back pain    Chronic kidney disease (CKD), stage III (moderate) (HCC)    Depression    Diverticulosis    Early cataracts, bilateral    Esophageal stricture    Gastritis    GAVE (gastric antral vascular ectasia)    GERD (gastroesophageal reflux disease)    Glaucoma    Heart murmur     " some doctors say that I have one some say that I don"t "   Hiatal hernia    History of blood transfusion    "@ least w/1st knee OR"   History of gout    History of kidney stones    Hyperlipidemia    Hypertension    Intestinal obstruction ([place])    Kidney stones    Obesity    Osteoarthritis    Osteoporosis    Pericarditis 2016   Presence of permanent cardiac pacemaker    Sjogren's disease ([place])    Thyroid disease    Type II diabetes mellitus ([place])    Vitamin B12 deficiency     ROS:   All systems reviewed and negative except as noted in the HPI.   Past Surgical History:  Procedure Laterality Date   ABDOMINAL HYSTERECTOMY     complete   CARDIOVERSION N/A 07/31/2020   Procedure: CARDIOVERSION;  Surgeon: Werner Lean, MD;  Location: [facility];  Service: Cardiovascular;  Laterality: N/A;   CHOLECYSTECTOMY OPEN     COLECTOMY     for rectovaginal fistula   COLONOSCOPY  01/07/2012   Procedure: COLONOSCOPY;  Surgeon: Lafayette Dragon, MD;  Location: [facility];  Service: Endoscopy;  Laterality: N/A;   EP IMPLANTABLE DEVICE N/A 06/18/2015   Procedure:  Pacemaker Implant;  Surgeon: Evans Lance, MD;  Location: [place] [facility];  Service: Cardiovascular;  Laterality: N/A;   ESOPHAGEAL DILATION  08/17/2020   Procedure: ESOPHAGEAL DILATION;  Surgeon: Lavena Bullion, DO;  Location: [place];  Service:  Gastroenterology;;   ESOPHAGOGASTRODUODENOSCOPY N/A 11/24/2018   Procedure: ESOPHAGOGASTRODUODENOSCOPY (EGD);  Surgeon: Yetta Flock, MD;  Location: [facility];  Service: Gastroenterology;  Laterality: N/A;   ESOPHAGOGASTRODUODENOSCOPY (EGD) WITH ESOPHAGEAL DILATION     ESOPHAGOGASTRODUODENOSCOPY (EGD) WITH PROPOFOL N/A 08/12/2015   Procedure: ESOPHAGOGASTRODUODENOSCOPY (EGD) WITH PROPOFOL;  Surgeon: Manus Gunning, MD;  Location: [place];  Service: Gastroenterology;  Laterality: N/A;   ESOPHAGOGASTRODUODENOSCOPY (EGD) WITH PROPOFOL N/A 08/17/2020   Procedure: ESOPHAGOGASTRODUODENOSCOPY (EGD) WITH PROPOFOL;  Surgeon: Lavena Bullion, DO;  Location: [place];  Service: Gastroenterology;  Laterality: N/A;   EYE SURGERY Bilateral    cataracts   HOT HEMOSTASIS N/A 11/24/2018   Procedure: HOT HEMOSTASIS (ARGON PLASMA COAGULATION/BICAP);  Surgeon: Yetta Flock, MD;  Location: [facility];  Service: Gastroenterology;  Laterality: N/A;   HOT HEMOSTASIS N/A 08/17/2020   Procedure: HOT HEMOSTASIS (ARGON PLASMA COAGULATION/BICAP);  Surgeon: Lavena Bullion, DO;  Location: [facility];  Service: Gastroenterology;  Laterality: N/A;   I & D EXTREMITY Right 08/06/2016   Procedure: DEBRIDEMENT PIP RIGHT RING FINGER;  Surgeon: Daryll Brod, MD;  Location: [place];  Service: Orthopedics;  Laterality: Right;   INSERT / REPLACE / REMOVE PACEMAKER     JOINT REPLACEMENT     KNEE ARTHROSCOPY Right    MASS EXCISION Right 08/06/2016   Procedure: EXCISION CYST;  Surgeon: Daryll Brod, MD;  Location: [place];  Service: Orthopedics;  Laterality: Right;   REVISION TOTAL KNEE ARTHROPLASTY Left    TOTAL KNEE ARTHROPLASTY Bilateral       Family History  Problem Relation Age of Onset   Diabetes Mother    Diabetes Brother    Hypertension Sister    Colon cancer Neg Hx    Heart attack Neg Hx    Stroke Neg Hx    Colon polyps Neg Hx    Esophageal cancer Neg Hx    Rectal cancer Neg Hx    Stomach cancer Neg Hx      Social History   Socioeconomic History   Marital status: Divorced    Spouse name: Not on file   Number of children: 2   Years of education: Not on file   Highest education level: Not on file  Occupational History   Occupation: retired  Tobacco Use   Smoking status: Former    Packs/day: 2.00    Years: 3.00    Pack years: 6.00    Types: Cigarettes    Quit date: 06/23/1983    Years since quitting: 38.4   Smokeless tobacco: Never  Vaping Use   Vaping Use: Never used  Substance and Sexual Activity   Alcohol use: Yes    Alcohol/week: 2.0 standard drinks    Types: 2 Glasses of wine per week    Comment: occ   Drug use: No   Sexual activity: Not Currently  Other Topics Concern   Not on file  Social History Narrative   Not on file   Social Determinants of Health   Financial Resource Strain: Not on file  Food Insecurity: Not on file  Transportation Needs: Not on file  Physical Activity: Not on file  Stress: Not on file  Social Connections: Not on file  Intimate Partner Violence: Not on file     BP (!) 150/72   Pulse 84   Ht 4' 10.5" (1.486 m)   Wt 188 lb (85.3 kg)   SpO2 90%   BMI 38.62 kg/m   Physical Exam:  Well appearing NAD HEENT: Unremarkable  Neck:  No JVD, no thyromegally Lymphatics:  No adenopathy Back:  No CVA tenderness Lungs:  Clear HEART:  Regular rate rhythm, no murmurs, no rubs, no clicks Abd:  soft, positive bowel sounds, no organomegally, no rebound, no guarding Ext:  2 plus pulses, no edema, no cyanosis, no clubbing Skin:  No rashes no nodules Neuro:  CN II through XII intact, motor grossly intact  EKG - nsr with ventricular pacing  DEVICE  Normal  device function.  See PaceArt for details.   Assess/Plan:  1. Persistent atrial fib - she is maintaining NSR. She has stopped amiodarone. 2. CHB - she is asymptomatic, s/p PPM Insertion.  3. Chronic diastolic heart failure - her symptoms are class 2. She is encouraged to maintain a low sodium diet, take her meds and to lose weight. 4. Obesity - her weight is up 8 lbs. I asked her to get into the 170's prior to her next visit.   Carleene Overlie Jhania Etherington,MD

## 2021-11-10 NOTE — Patient Instructions (Signed)
Medication Instructions:  Your physician recommends that you continue on your current medications as directed. Please refer to the Current Medication list given to you today. *If you need a refill on your cardiac medications before your next appointment, please call your pharmacy*  Lab Work: None. If you have labs (blood work) drawn today and your tests are completely normal, you will receive your results only by: [place] (if you have MyChart) OR A paper copy in the mail If you have any lab test that is abnormal or we need to change your treatment, we will call you to review the results.  Testing/Procedures: None.  Follow-Up: At [facility], you and your health needs are our priority.  As part of our continuing mission to provide you with exceptional heart care, we have created designated Provider Care Teams.  These Care Teams include your primary Cardiologist (physician) and Advanced Practice Providers (APPs -  Physician Assistants and Nurse Practitioners) who all work together to provide you with the care you need, when you need it.  Your physician wants you to follow-up in: 12 months with Cristopher Peru, MD or one of the following Advanced Practice Providers on your designated Care Team:    Tommye Standard, [place] Legrand Como "Jonni Sanger" [place], [place]   You will receive a reminder letter in the mail two months in advance. If you don't receive a letter, please call our office to schedule the follow-up appointment.  We recommend signing up for the patient portal called "MyChart".  Sign up information is provided on this After Visit Summary.  MyChart is used to connect with patients for Virtual Visits (Telemedicine).  Patients are able to view lab/test results, encounter notes, upcoming appointments, etc.  Non-urgent messages can be sent to your provider as well.   To learn more about what you can do with MyChart, go to [email or web address].    Any Other Special Instructions Will Be Listed  Below (If Applicable).

## 2021-11-13 ENCOUNTER — Ambulatory Visit (INDEPENDENT_AMBULATORY_CARE_PROVIDER_SITE_OTHER): Payer: Medicare Other

## 2021-11-13 ENCOUNTER — Ambulatory Visit (INDEPENDENT_AMBULATORY_CARE_PROVIDER_SITE_OTHER): Payer: Medicare Other | Admitting: Rheumatology

## 2021-11-13 ENCOUNTER — Encounter: Payer: Self-pay | Admitting: Rheumatology

## 2021-11-13 VITALS — BP 142/81 | HR 63 | Resp 12 | Ht <= 58 in | Wt 188.2 lb

## 2021-11-13 DIAGNOSIS — N183 Chronic kidney disease, stage 3 unspecified: Secondary | ICD-10-CM

## 2021-11-13 DIAGNOSIS — M5136 Other intervertebral disc degeneration, lumbar region: Secondary | ICD-10-CM

## 2021-11-13 DIAGNOSIS — M19071 Primary osteoarthritis, right ankle and foot: Secondary | ICD-10-CM

## 2021-11-13 DIAGNOSIS — Z950 Presence of cardiac pacemaker: Secondary | ICD-10-CM

## 2021-11-13 DIAGNOSIS — I480 Paroxysmal atrial fibrillation: Secondary | ICD-10-CM

## 2021-11-13 DIAGNOSIS — K449 Diaphragmatic hernia without obstruction or gangrene: Secondary | ICD-10-CM

## 2021-11-13 DIAGNOSIS — N189 Chronic kidney disease, unspecified: Secondary | ICD-10-CM

## 2021-11-13 DIAGNOSIS — M19042 Primary osteoarthritis, left hand: Secondary | ICD-10-CM

## 2021-11-13 DIAGNOSIS — M810 Age-related osteoporosis without current pathological fracture: Secondary | ICD-10-CM

## 2021-11-13 DIAGNOSIS — Z8719 Personal history of other diseases of the digestive system: Secondary | ICD-10-CM

## 2021-11-13 DIAGNOSIS — M19041 Primary osteoarthritis, right hand: Secondary | ICD-10-CM

## 2021-11-13 DIAGNOSIS — Z79899 Other long term (current) drug therapy: Secondary | ICD-10-CM | POA: Diagnosis not present

## 2021-11-13 DIAGNOSIS — M180 Bilateral primary osteoarthritis of first carpometacarpal joints: Secondary | ICD-10-CM

## 2021-11-13 DIAGNOSIS — I495 Sick sinus syndrome: Secondary | ICD-10-CM

## 2021-11-13 DIAGNOSIS — Z96642 Presence of left artificial hip joint: Secondary | ICD-10-CM

## 2021-11-13 DIAGNOSIS — E538 Deficiency of other specified B group vitamins: Secondary | ICD-10-CM

## 2021-11-13 DIAGNOSIS — K31819 Angiodysplasia of stomach and duodenum without bleeding: Secondary | ICD-10-CM

## 2021-11-13 DIAGNOSIS — M2550 Pain in unspecified joint: Secondary | ICD-10-CM

## 2021-11-13 DIAGNOSIS — M3509 Sicca syndrome with other organ involvement: Secondary | ICD-10-CM | POA: Diagnosis not present

## 2021-11-13 DIAGNOSIS — G63 Polyneuropathy in diseases classified elsewhere: Secondary | ICD-10-CM

## 2021-11-13 DIAGNOSIS — K222 Esophageal obstruction: Secondary | ICD-10-CM

## 2021-11-13 DIAGNOSIS — Z8669 Personal history of other diseases of the nervous system and sense organs: Secondary | ICD-10-CM

## 2021-11-13 DIAGNOSIS — Z794 Long term (current) use of insulin: Secondary | ICD-10-CM

## 2021-11-13 DIAGNOSIS — Z8739 Personal history of other diseases of the musculoskeletal system and connective tissue: Secondary | ICD-10-CM

## 2021-11-13 DIAGNOSIS — M19072 Primary osteoarthritis, left ankle and foot: Secondary | ICD-10-CM

## 2021-11-13 DIAGNOSIS — D631 Anemia in chronic kidney disease: Secondary | ICD-10-CM

## 2021-11-13 DIAGNOSIS — E1142 Type 2 diabetes mellitus with diabetic polyneuropathy: Secondary | ICD-10-CM

## 2021-11-13 DIAGNOSIS — F419 Anxiety disorder, unspecified: Secondary | ICD-10-CM

## 2021-11-13 DIAGNOSIS — F32A Depression, unspecified: Secondary | ICD-10-CM

## 2021-11-13 DIAGNOSIS — M542 Cervicalgia: Secondary | ICD-10-CM

## 2021-11-13 DIAGNOSIS — K5790 Diverticulosis of intestine, part unspecified, without perforation or abscess without bleeding: Secondary | ICD-10-CM

## 2021-11-13 DIAGNOSIS — Z96653 Presence of artificial knee joint, bilateral: Secondary | ICD-10-CM

## 2021-11-13 DIAGNOSIS — M87052 Idiopathic aseptic necrosis of left femur: Secondary | ICD-10-CM

## 2021-11-13 DIAGNOSIS — I10 Essential (primary) hypertension: Secondary | ICD-10-CM

## 2021-11-13 DIAGNOSIS — I5031 Acute diastolic (congestive) heart failure: Secondary | ICD-10-CM

## 2021-11-13 NOTE — Patient Instructions (Addendum)
Please schedule an appointment with Dr. Cruzita Lederer for the treatment of osteoporosis.   Hand Exercises Hand exercises can be helpful for almost anyone. These exercises can strengthen the hands, improve flexibility and movement, and increase blood flow to the hands. These results can make work and daily tasks easier. Hand exercises can be especially helpful for people who have joint pain from arthritis or have nerve damage from overuse (carpal tunnel syndrome). These exercises can also help people who have injured a hand. Exercises Most of these hand exercises are gentle stretching and motion exercises. It is usually safe to do them often throughout the day. Warming up your hands before exercise may help to reduce stiffness. You can do this with gentle massage or by placing your hands in warm water for 10-15 minutes. It is normal to feel some stretching, pulling, tightness, or mild discomfort as you begin new exercises. This will gradually improve. Stop an exercise right away if you feel sudden, severe pain or your pain gets worse. Ask your health care provider which exercises are best for you. Knuckle bend or "claw" fist  Stand or sit with your arm, hand, and all five fingers pointed straight up. Make sure to keep your wrist straight during the exercise. Gently bend your fingers down toward your palm until the tips of your fingers are touching the top of your palm. Keep your big knuckle straight and just bend the small knuckles in your fingers. Hold this position for __________ seconds. Straighten (extend) your fingers back to the starting position. Repeat this exercise 5-10 times with each hand. Full finger fist  Stand or sit with your arm, hand, and all five fingers pointed straight up. Make sure to keep your wrist straight during the exercise. Gently bend your fingers into your palm until the tips of your fingers are touching the middle of your palm. Hold this position for __________  seconds. Extend your fingers back to the starting position, stretching every joint fully. Repeat this exercise 5-10 times with each hand. Straight fist Stand or sit with your arm, hand, and all five fingers pointed straight up. Make sure to keep your wrist straight during the exercise. Gently bend your fingers at the big knuckle, where your fingers meet your hand, and the middle knuckle. Keep the knuckle at the tips of your fingers straight and try to touch the bottom of your palm. Hold this position for __________ seconds. Extend your fingers back to the starting position, stretching every joint fully. Repeat this exercise 5-10 times with each hand. Tabletop  Stand or sit with your arm, hand, and all five fingers pointed straight up. Make sure to keep your wrist straight during the exercise. Gently bend your fingers at the big knuckle, where your fingers meet your hand, as far down as you can while keeping the small knuckles in your fingers straight. Think of forming a tabletop with your fingers. Hold this position for __________ seconds. Extend your fingers back to the starting position, stretching every joint fully. Repeat this exercise 5-10 times with each hand. Finger spread  Place your hand flat on a table with your palm facing down. Make sure your wrist stays straight as you do this exercise. Spread your fingers and thumb apart from each other as far as you can until you feel a gentle stretch. Hold this position for __________ seconds. Bring your fingers and thumb tight together again. Hold this position for __________ seconds. Repeat this exercise 5-10 times with each hand. Making  circles  Stand or sit with your arm, hand, and all five fingers pointed straight up. Make sure to keep your wrist straight during the exercise. Make a circle by touching the tip of your thumb to the tip of your index finger. Hold for __________ seconds. Then open your hand wide. Repeat this motion with  your thumb and each finger on your hand. Repeat this exercise 5-10 times with each hand. Thumb motion  Sit with your forearm resting on a table and your wrist straight. Your thumb should be facing up toward the ceiling. Keep your fingers relaxed as you move your thumb. Lift your thumb up as high as you can toward the ceiling. Hold for __________ seconds. Bend your thumb across your palm as far as you can, reaching the tip of your thumb for the small finger (pinkie) side of your palm. Hold for __________ seconds. Repeat this exercise 5-10 times with each hand. Grip strengthening  Hold a stress ball or other soft ball in the middle of your hand. Slowly increase the pressure, squeezing the ball as much as you can without causing pain. Think of bringing the tips of your fingers into the middle of your palm. All of your finger joints should bend when doing this exercise. Hold your squeeze for __________ seconds, then relax. Repeat this exercise 5-10 times with each hand. Contact a health care provider if: Your hand pain or discomfort gets much worse when you do an exercise. Your hand pain or discomfort does not improve within 2 hours after you exercise. If you have any of these problems, stop doing these exercises right away. Do not do them again unless your health care provider says that you can. Get help right away if: You develop sudden, severe hand pain or swelling. If this happens, stop doing these exercises right away. Do not do them again unless your health care provider says that you can. This information is not intended to replace advice given to you by your health care provider. Make sure you discuss any questions you have with your health care provider. Document Revised: 09/26/2020 Document Reviewed: 09/26/2020 Elsevier Patient Education  [place].  Sjogren's Syndrome Sjgren's syndrome is an inflammatory disease in which the body's disease-fighting system (immune system)  attacks the glands that produce tears (lacrimal glands) and the glands that produce saliva (salivary glands). This makes the eyes and mouth very dry. Sjgren's syndrome can also affect other parts of the body, causing dryness of the skin, nose, throat, and vagina. Sjgren's syndrome is a long-term (chronic) disorder that has no cure. In some cases, it is linked to other disorders (rheumatic disorders), such as rheumatoid arthritis and systemic lupus erythematosus (SLE). It may affect other parts of the body, such as the: Blood vessels. Joints. Lungs. Kidneys. Liver or pancreas. Brain, nerves, or spinal cord. What are the causes? The cause of this condition is not known. It may be passed along from parent to child (inherited), or it may be a symptom of a rheumatic disorder. What increases the risk? This condition is more likely to develop in: Women. People who are 40-75 years old and older. People who have recently had a viral infection or currently have a viral infection. What are the signs or symptoms? The main symptoms of this condition are: Dry mouth. This may include: A chalky feeling. Difficulty swallowing, speaking, or tasting. Frequent cavities in the teeth. Frequent mouth infections. Dry eyes. This may include: Burning, redness, and itching. Blurry vision. Fluctuating  vision. Light sensitivity. Other symptoms may include: Dryness of the skin and the inside of the nose. Eyelid infections. Vaginal dryness (if applicable). Joint pain and stiffness. Muscle pain and stiffness. How is this diagnosed? This condition is diagnosed based on: Your symptoms. Your medical history. A physical exam of your eyes and mouth. Tests, including: A Schirmer test. This tests your tear production. An eye exam that is done with a magnifying device (slit-lamp exam). An eye test that temporarily stains your eye with special dyes. This shows the extent of eye damage. Tests to check your  salivary gland function. Biopsy. This is a removal of part of a salivary gland from inside your lower lip to be studied under a microscope. Chest X-rays. Blood or urine tests. How is this treated? There is no cure for this condition, but treatment can help you manage your symptoms. You may be asked to see a rheumatologist for further evaluation and treatment. This condition may be treated with: Medicines to help relieve pain and stiffness. Medicines to help relieve inflammation in your body (corticosteroids). These are usually for severe cases. Medicines to help reduce the activity of your immune system (immunosuppressants). These are usually prescribed by your health care provider or a rheumatologist. Moisture replacement therapies to help relieve dryness in your skin, mouth, and eyes. Dry eyes may be treated with: Eye drops or nasal sprays to improve dryness of the eyes. Surgery or insertion of plugs to close the lacrimal glands (punctal occlusion). This helps keep more natural tears in your eyes. Soft contact lenses or hard scleral lenses. These are occasionally used to protect the surface of the eye. Biologic lubricating eye drops (serum tears). These are eye drops made from a person's own blood. They are used in some people with severe dry eye. Follow these instructions at home: Eye care  Use eye drops and other medicines as told by your health care provider. Protect your eyes from the sun and wind with sunglasses or glasses. Blink at least 5-6 times a minute. Maintain properly humidified air. You may want to use a humidifier at home and at work. Avoid smoke. Mouth care Brush your teeth and floss after every meal. Chew sugar-free gum or suck on hard candy. This may help to relieve dry mouth. Use antimicrobial mouthwash daily. Take frequent sips of water or sugar-free drinks. Use saliva substitutes or lip balm as told by your health care provider. See your dentist every 6  months. General instructions  Take over-the-counter and prescription medicines only as told by your health care provider. Drink enough fluid to keep your urine pale yellow. Keep all follow-up visits. This is important. Contact a health care provider if: You have a fever. You have night sweats. You are always tired. You have unexplained weight loss. You develop itchy skin. You have red patches on your skin. You have a lump or swelling on your neck. Get help right away if: You develop severe eye pain. You develop sudden decreased vision. Summary Sjgren's syndrome is a disease in which the body's immune system attacks the glands that produce tears and the glands that produce saliva. This condition makes the eyes and mouth very dry. Sjgren's syndrome is a long-term (chronic) disorder. There is no cure for this condition, but treatment can help you manage your symptoms. The cause of this condition is not known. You may be asked to see a rheumatologist for further evaluation and treatment. This information is not intended to replace advice given to you  by your health care provider. Make sure you discuss any questions you have with your health care provider. Document Revised: 01/06/2021 Document Reviewed: 01/06/2021 Elsevier Patient Education  [place].

## 2021-11-19 NOTE — Progress Notes (Signed)
I will discuss results at the follow-up visit.

## 2021-11-21 LAB — PROTEIN ELECTROPHORESIS, SERUM, WITH REFLEX
Albumin ELP: 3.7 g/dL — ABNORMAL LOW (ref 3.8–4.8)
Alpha 1: 0.3 g/dL (ref 0.2–0.3)
Alpha 2: 0.7 g/dL (ref 0.5–0.9)
Beta 2: 0.6 g/dL — ABNORMAL HIGH (ref 0.2–0.5)
Beta Globulin: 0.4 g/dL (ref 0.4–0.6)
Gamma Globulin: 0.6 g/dL — ABNORMAL LOW (ref 0.8–1.7)
Total Protein: 6.3 g/dL (ref 6.1–8.1)

## 2021-11-21 LAB — ANTI-NUCLEAR AB-TITER (ANA TITER)
ANA TITER: 1:40 {titer} — ABNORMAL HIGH
ANA Titer 1: 1:320 {titer} — ABNORMAL HIGH

## 2021-11-21 LAB — URINALYSIS, ROUTINE W REFLEX MICROSCOPIC
Bacteria, UA: NONE SEEN /HPF
Bilirubin Urine: NEGATIVE
Glucose, UA: NEGATIVE
Hyaline Cast: NONE SEEN /LPF
Ketones, ur: NEGATIVE
Leukocytes,Ua: NEGATIVE
Nitrite: NEGATIVE
RBC / HPF: NONE SEEN /HPF (ref 0–2)
Specific Gravity, Urine: 1.009 (ref 1.001–1.035)
Squamous Epithelial / HPF: NONE SEEN /HPF (ref <=5)
WBC, UA: NONE SEEN /HPF (ref 0–5)
pH: 6 (ref 5.0–8.0)

## 2021-11-21 LAB — MICROSCOPIC MESSAGE

## 2021-11-21 LAB — ANTI-SCLERODERMA ANTIBODY: Scleroderma (Scl-70) (ENA) Antibody, IgG: <1 AI

## 2021-11-21 LAB — SJOGRENS SYNDROME-A EXTRACTABLE NUCLEAR ANTIBODY: SSA (Ro) (ENA) Antibody, IgG: 7.3 AI — AB

## 2021-11-21 LAB — CARDIOLIPIN ANTIBODIES, IGG, IGM, IGA
Anticardiolipin IgA: 4.3 APL-U/mL (ref <20.0)
Anticardiolipin IgG: <2 GPL-U/mL (ref <20.0)
Anticardiolipin IgM: <2 MPL-U/mL (ref <20.0)

## 2021-11-21 LAB — BETA-2 GLYCOPROTEIN ANTIBODIES
Beta-2 Glyco 1 IgA: 2.5 U/mL (ref <20.0)
Beta-2 Glyco 1 IgM: <2 U/mL (ref <20.0)
Beta-2 Glyco I IgG: <2 U/mL (ref <20.0)

## 2021-11-21 LAB — C3 AND C4
C3 Complement: 89 mg/dL (ref 83–193)
C4 Complement: 17 mg/dL (ref 15–57)

## 2021-11-21 LAB — LUPUS ANTICOAGULANT EVAL W/ REFLEX
PTT-LA Screen: 40 s (ref <=40)
dRVVT: 47 s — ABNORMAL HIGH (ref <=45)

## 2021-11-21 LAB — ANA: Anti Nuclear Antibody (ANA): POSITIVE — AB

## 2021-11-21 LAB — URIC ACID: Uric Acid, Serum: 7 mg/dL (ref 2.5–7.0)

## 2021-11-21 LAB — ANTI-SMITH ANTIBODY: ENA SM Ab Ser-aCnc: <1 AI

## 2021-11-21 LAB — SEDIMENTATION RATE: Sed Rate: 39 mm/h — ABNORMAL HIGH (ref 0–30)

## 2021-11-21 LAB — RHEUMATOID FACTOR: Rheumatoid fact SerPl-aCnc: <14 IU/mL (ref <14)

## 2021-11-21 LAB — SJOGRENS SYNDROME-B EXTRACTABLE NUCLEAR ANTIBODY: SSB (La) (ENA) Antibody, IgG: <1 AI

## 2021-11-21 LAB — RNP ANTIBODY: Ribonucleic Protein(ENA) Antibody, IgG: <1 AI

## 2021-11-21 LAB — GLUCOSE 6 PHOSPHATE DEHYDROGENASE: G-6PDH: 18.7 U/g Hgb (ref 7.0–20.5)

## 2021-11-21 LAB — IFE INTERPRETATION: Immunofix Electr Int: DETECTED

## 2021-11-21 LAB — RFLX DRVVT CONFRIM: DRVVT CONFIRM: NEGATIVE

## 2021-11-21 LAB — ANTI-DNA ANTIBODY, DOUBLE-STRANDED: ds DNA Ab: 25 IU/mL — ABNORMAL HIGH

## 2021-11-25 ENCOUNTER — Other Ambulatory Visit: Payer: Medicare Other

## 2021-11-25 ENCOUNTER — Ambulatory Visit: Payer: Medicare Other

## 2021-12-02 ENCOUNTER — Other Ambulatory Visit: Payer: Self-pay

## 2021-12-02 ENCOUNTER — Inpatient Hospital Stay: Payer: Medicare Other | Attending: Oncology

## 2021-12-02 ENCOUNTER — Inpatient Hospital Stay: Payer: Medicare Other

## 2021-12-02 VITALS — BP 174/85 | HR 83 | Temp 98.3°F | Resp 18 | Ht <= 58 in | Wt 188.2 lb

## 2021-12-02 DIAGNOSIS — D631 Anemia in chronic kidney disease: Secondary | ICD-10-CM

## 2021-12-02 DIAGNOSIS — N183 Chronic kidney disease, stage 3 unspecified: Secondary | ICD-10-CM

## 2021-12-02 LAB — CBC WITH DIFFERENTIAL (CANCER CENTER ONLY)
Abs Immature Granulocytes: 0.03 10*3/uL (ref 0.00–0.07)
Basophils Absolute: 0 10*3/uL (ref 0.0–0.1)
Basophils Relative: 0 %
Eosinophils Absolute: 0 10*3/uL (ref 0.0–0.5)
Eosinophils Relative: 1 %
HCT: 32.8 % — ABNORMAL LOW (ref 36.0–46.0)
Hemoglobin: 10.1 g/dL — ABNORMAL LOW (ref 12.0–15.0)
Immature Granulocytes: 1 %
Lymphocytes Relative: 9 %
Lymphs Abs: 0.5 10*3/uL — ABNORMAL LOW (ref 0.7–4.0)
MCH: 31 pg (ref 26.0–34.0)
MCHC: 30.8 g/dL (ref 30.0–36.0)
MCV: 100.6 fL — ABNORMAL HIGH (ref 80.0–100.0)
Monocytes Absolute: 0.5 10*3/uL (ref 0.1–1.0)
Monocytes Relative: 8 %
Neutro Abs: 5 10*3/uL (ref 1.7–7.7)
Neutrophils Relative %: 81 %
Platelet Count: 202 10*3/uL (ref 150–400)
RBC: 3.26 MIL/uL — ABNORMAL LOW (ref 3.87–5.11)
RDW: 14.9 % (ref 11.5–15.5)
WBC Count: 6.1 10*3/uL (ref 4.0–10.5)
nRBC: 0 % (ref 0.0–0.2)

## 2021-12-02 MED ORDER — EPOETIN ALFA-EPBX 40000 UNIT/ML IJ SOLN
40000.0000 [IU] | Freq: Once | INTRAMUSCULAR | Status: AC
  Administered 2021-12-02: 40000 [IU] via SUBCUTANEOUS
  Filled 2021-12-02: qty 1

## 2021-12-02 MED ORDER — EPOETIN ALFA-EPBX 10000 UNIT/ML IJ SOLN
20000.0000 [IU] | Freq: Once | INTRAMUSCULAR | Status: AC
  Administered 2021-12-02: 20000 [IU] via SUBCUTANEOUS
  Filled 2021-12-02: qty 2

## 2021-12-02 NOTE — Patient Instructions (Signed)
Epoetin Alfa injection What is this medication? EPOETIN ALFA (e POE e tin AL fa) helps your body make more red blood cells. This medicine is used to treat anemia caused by chronic kidney disease, cancer chemotherapy, or HIV-therapy. It may also be used before surgery if you have anemia. This medicine may be used for other purposes; ask your health care provider or pharmacist if you have questions. COMMON BRAND NAME(S): Epogen, Procrit, Retacrit What should I tell my care team before I take this medication? They need to know if you have any of these conditions: cancer heart disease high blood pressure history of blood clots history of stroke low levels of folate, iron, or vitamin B12 in the blood seizures an unusual or allergic reaction to erythropoietin, albumin, benzyl alcohol, hamster proteins, other medicines, foods, dyes, or preservatives pregnant or trying to get pregnant breast-feeding How should I use this medication? This medicine is for injection into a vein or under the skin. It is usually given by a health care professional in a hospital or clinic setting. If you get this medicine at home, you will be taught how to prepare and give this medicine. Use exactly as directed. Take your medicine at regular intervals. Do not take your medicine more often than directed. It is important that you put your used needles and syringes in a special sharps container. Do not put them in a trash can. If you do not have a sharps container, call your pharmacist or healthcare provider to get one. A special MedGuide will be given to you by the pharmacist with each prescription and refill. Be sure to read this information carefully each time. Talk to your pediatrician regarding the use of this medicine in children. While this drug may be prescribed for selected conditions, precautions do apply. Overdosage: If you think you have taken too much of this medicine contact a poison control center or emergency  room at once. NOTE: This medicine is only for you. Do not share this medicine with others. What if I miss a dose? If you miss a dose, take it as soon as you can. If it is almost time for your next dose, take only that dose. Do not take double or extra doses. What may interact with this medication? Interactions have not been studied. This list may not describe all possible interactions. Give your health care provider a list of all the medicines, herbs, non-prescription drugs, or dietary supplements you use. Also tell them if you smoke, drink alcohol, or use illegal drugs. Some items may interact with your medicine. What should I watch for while using this medication? Your condition will be monitored carefully while you are receiving this medicine. You may need blood work done while you are taking this medicine. This medicine may cause a decrease in vitamin B6. You should make sure that you get enough vitamin B6 while you are taking this medicine. Discuss the foods you eat and the vitamins you take with your health care professional. What side effects may I notice from receiving this medication? Side effects that you should report to your doctor or health care professional as soon as possible: allergic reactions like skin rash, itching or hives, swelling of the face, lips, or tongue seizures signs and symptoms of a blood clot such as breathing problems; changes in vision; chest pain; severe, sudden headache; pain, swelling, warmth in the leg; trouble speaking; sudden numbness or weakness of the face, arm or leg signs and symptoms of a stroke like  changes in vision; confusion; trouble speaking or understanding; severe headaches; sudden numbness or weakness of the face, arm or leg; trouble walking; dizziness; loss of balance or coordination ?Side effects that usually do not require medical attention (report to your doctor or health care professional if they continue or are  bothersome): ?chills ?cough ?dizziness ?fever ?headaches ?joint pain ?muscle cramps ?muscle pain ?nausea, vomiting ?pain, redness, or irritation at site where injected ?This list may not describe all possible side effects. Call your doctor for medical advice about side effects. You may report side effects to FDA at 1-800-FDA-1088. ?Where should I keep my medication? ?Keep out of the reach of children. ?Store in a refrigerator between 2 and 8 degrees C (36 and 46 degrees F). Do not freeze or shake. Throw away any unused portion if using a single-dose vial. Multi-dose vials can be kept in the refrigerator for up to 21 days after the initial dose. Throw away unused medicine. ?NOTE: This sheet is a summary. It may not cover all possible information. If you have questions about this medicine, talk to your doctor, pharmacist, or health care provider. ?? 2023 Elsevier/Gold Standard (2017-02-09 00:00:00) ? ?

## 2021-12-05 NOTE — Progress Notes (Signed)
Office Visit Note  Patient: Tricia Gonzales             Date of Birth: 06-Apr-1941           MRN: [id number]             PCP: Carol Ada, MD Referring: Carol Ada, MD Visit Date: 12/11/2021 Occupation: _0 @  Subjective:  Dry mouth and dry eyes  History of Present Illness: Tricia Gonzales is a 81 y.o. female with history of Sjogren's, osteoarthritis and osteoporosis.  She continues to have dry mouth and dry eyes.  She has been having increased pain and discomfort in her bilateral hands and bilateral feet.  Her bilateral knee joint replacements are doing well.  Left hip replacement is doing well.  She denies any history of joint swelling.  There is no history of oral ulcers, nasal ulcers, Raynaud's phenomenon, lymphadenopathy or photosensitivity.  She gives history of dry mouth and dry eyes for many years.  Patient states that she was diagnosed with autoimmune disease by Dr. Rockwell Alexandria in the past and was treated with hydroxychloroquine.  She has not had any gout flare.  She is on Uloric 40 mg 3 times a week by her nephrologist.  She states she took Prolia in the past for osteoporosis but decided to come off Prolia as she is concerned about the side effects.  She was given Prolia by Dr. Cruzita Lederer.  Activities of Daily Living:  Patient reports morning stiffness for 3 hours.   Patient Reports nocturnal pain.  Difficulty dressing/grooming: Reports Difficulty climbing stairs: Reports Difficulty getting out of chair: Reports Difficulty using hands for taps, buttons, cutlery, and/or writing: Reports  Review of Systems  Constitutional:  Positive for fatigue.  HENT:  Positive for mouth dryness. Negative for mouth sores.   Eyes:  Positive for dryness.  Respiratory:  Positive for shortness of breath.   Cardiovascular:  Positive for swelling in legs/feet.  Gastrointestinal:  Positive for nausea.  Endocrine: Positive for cold intolerance.  Genitourinary:  Negative for difficulty urinating.   Musculoskeletal:  Positive for joint pain, gait problem, joint pain, muscle weakness, morning stiffness and muscle tenderness. Negative for joint swelling.  Skin:  Negative for color change, rash, hair loss and sensitivity to sunlight.  Allergic/Immunologic: Negative for susceptible to infections.  Neurological:  Positive for numbness and weakness.  Hematological:  Positive for bruising/bleeding tendency. Negative for swollen glands.  Psychiatric/Behavioral:  Positive for sleep disturbance.     PMFS History:  Patient Active Problem List   Diagnosis Date Noted   Benign esophageal stricture    Acute blood loss anemia    Symptomatic anemia    Acute diastolic heart failure ([place]) 08/15/2020   AKI (acute kidney injury) ([place]) 08/15/2020   CHF (congestive heart failure) ([place]) 08/15/2020   Atrial flutter (HCC)    Sinus node dysfunction ([place]) 09/06/2019   Status post left hip replacement 06/28/2019   Urinary retention 05/12/2019   Polyneuropathy associated with underlying disease ([place]) 05/12/2019   Immobility 04/26/2019   Low ferritin 04/24/2019   Primary osteoarthritis of left hip 03/29/2019   GAVE (gastric antral vascular ectasia)    Avascular necrosis of bone of hip, left ([place]) 08/22/2018   Vitamin B12 deficiency    Type II diabetes mellitus ([place])    Sjogren's disease ([place])    Presence of permanent cardiac pacemaker    Pericarditis    Osteoarthritis    Obesity    Kidney stones    Intestinal obstruction ([place])  History of gout    Hiatal hernia    History of blood transfusion    Heart murmur    GERD (gastroesophageal reflux disease)    Gastritis    Esophageal stricture    Early cataracts, bilateral    Diverticulosis    Depression    Chronic kidney disease (CKD), stage III (moderate) (HCC)    Chronic back pain    Atrial fibrillation (HCC)    Arthritis    Anxiety    Anemia    Osteoarthritis of finger of right hand 01/22/2016   Pain 01/22/2016   Primary osteoarthritis of  both first carpometacarpal joints 01/22/2016   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/2015   Chest pain at rest    Heme positive stool    UTI (urinary tract infection) 08/09/2015   Acute pericarditis    Chest pain 08/06/2015   Diabetes mellitus type 2, controlled ([place]) 08/06/2015   Hyperlipidemia 08/06/2015   Hypertension 08/06/2015   CKD (chronic kidney disease) stage 3, GFR 30-59 ml/min (HCC) 08/06/2015   Chronic anemia 08/06/2015   Hyperkalemia    Pain in the chest    Pacemaker    Junctional bradycardia 06/17/2015   Osteoporosis 03/22/2015   Abdominal pain, left lower quadrant 01/07/2012   Syncope 09/15/2011   Junctional escape rhythm 09/15/2011   PELVIC MASS 03/27/2009   ABSCESS OF INTESTINE 02/19/2009   VITAMIN B12 DEFICIENCY 11/21/2007   OBESITY 11/21/2007   ESOPHAGEAL STRICTURE 11/21/2007   HIATAL HERNIA 11/21/2007   UNSPECIFIED INTESTINAL OBSTRUCTION 11/21/2007   DIVERTICULOSIS OF COLON 11/21/2007   OSTEOARTHRITIS 11/21/2007   DIABETES MELLITUS, HX OF 11/21/2007   ANEMIA, HX OF 11/21/2007   HYPERTENSION, HX OF 11/21/2007   REFLUX ESOPHAGITIS, HX OF 11/21/2007    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   Anxiety    Arthritis    Atrial fibrillation ([place])    Avascular necrosis ([place]) hip left   leg pain also   Chronic back pain    Chronic kidney disease (CKD), stage III (moderate) (HCC)    Depression    Diverticulosis    Early cataracts, bilateral    Esophageal stricture    Gastritis    GAVE (gastric antral vascular ectasia)    GERD (gastroesophageal reflux disease)    Glaucoma    Heart murmur     " some doctors say that I have one some say that I don"t "   Hiatal hernia    History of blood transfusion    "@ least w/1st knee OR"   History of gout    History of kidney stones    Hyperlipidemia    Hypertension    Intestinal obstruction ([place])    Kidney stones    Obesity    Osteoarthritis     Osteoporosis    Pericarditis 2016   Presence of permanent cardiac pacemaker    Sjogren's disease ([place])    Thyroid disease    Type II diabetes mellitus ([place])    Vitamin B12 deficiency     Family History  Problem Relation Age of Onset   Diabetes Mother    Hypertension Sister    Diabetes Brother    Colon cancer Neg Hx    Heart attack Neg Hx    Stroke Neg Hx    Colon polyps Neg Hx    Esophageal cancer Neg Hx    Rectal cancer Neg Hx    Stomach cancer Neg Hx  Past Surgical History:  Procedure Laterality Date   ABDOMINAL HYSTERECTOMY     complete   CARDIOVERSION N/A 07/31/2020   Procedure: CARDIOVERSION;  Surgeon: Werner Lean, MD;  Location: [facility];  Service: Cardiovascular;  Laterality: N/A;   CHOLECYSTECTOMY OPEN     COLECTOMY     for rectovaginal fistula   COLONOSCOPY  01/07/2012   Procedure: COLONOSCOPY;  Surgeon: Lafayette Dragon, MD;  Location: [facility];  Service: Endoscopy;  Laterality: N/A;   EP IMPLANTABLE DEVICE N/A 06/18/2015   Procedure: Pacemaker Implant;  Surgeon: Evans Lance, MD;  Location: [facility];  Service: Cardiovascular;  Laterality: N/A;   ESOPHAGEAL DILATION  08/17/2020   Procedure: ESOPHAGEAL DILATION;  Surgeon: Lavena Bullion, DO;  Location: [facility];  Service: Gastroenterology;;   ESOPHAGOGASTRODUODENOSCOPY N/A 11/24/2018   Procedure: ESOPHAGOGASTRODUODENOSCOPY (EGD);  Surgeon: Yetta Flock, MD;  Location: [facility];  Service: Gastroenterology;  Laterality: N/A;   ESOPHAGOGASTRODUODENOSCOPY (EGD) WITH ESOPHAGEAL DILATION     ESOPHAGOGASTRODUODENOSCOPY (EGD) WITH PROPOFOL N/A 08/12/2015   Procedure: ESOPHAGOGASTRODUODENOSCOPY (EGD) WITH PROPOFOL;  Surgeon: Manus Gunning, MD;  Location: [facility];  Service: Gastroenterology;  Laterality: N/A;   ESOPHAGOGASTRODUODENOSCOPY (EGD) WITH PROPOFOL N/A 08/17/2020   Procedure: ESOPHAGOGASTRODUODENOSCOPY (EGD) WITH PROPOFOL;  Surgeon: Lavena Bullion, DO;   Location: [facility];  Service: Gastroenterology;  Laterality: N/A;   EYE SURGERY Bilateral    cataracts   HOT HEMOSTASIS N/A 11/24/2018   Procedure: HOT HEMOSTASIS (ARGON PLASMA COAGULATION/BICAP);  Surgeon: Yetta Flock, MD;  Location: [facility];  Service: Gastroenterology;  Laterality: N/A;   HOT HEMOSTASIS N/A 08/17/2020   Procedure: HOT HEMOSTASIS (ARGON PLASMA COAGULATION/BICAP);  Surgeon: Lavena Bullion, DO;  Location: [facility] ENDOSCOPY;  Service: Gastroenterology;  Laterality: N/A;   I & D EXTREMITY Right 08/06/2016   Procedure: DEBRIDEMENT PIP RIGHT RING FINGER;  Surgeon: Daryll Brod, MD;  Location: [facility];  Service: Orthopedics;  Laterality: Right;   INSERT / REPLACE / REMOVE PACEMAKER     JOINT REPLACEMENT     KNEE ARTHROSCOPY Right    MASS EXCISION Right 08/06/2016   Procedure: EXCISION CYST;  Surgeon: Daryll Brod, MD;  Location: [facility];  Service: Orthopedics;  Laterality: Right;   REVISION TOTAL KNEE ARTHROPLASTY Left    TOTAL KNEE ARTHROPLASTY Bilateral    Social History   Social History Narrative   Not on file   Immunization History  Administered Date(s) Administered   Influenza Split 03/27/2008, 04/26/2009, 04/07/2011   Influenza, High Dose Seasonal PF 03/22/2012, 03/15/2014, 03/19/2015, 03/23/2016, 03/22/2017, 03/16/2018, 03/07/2019, 04/16/2020   Influenza-Unspecified 03/23/2015, 04/05/2017   PFIZER(Purple Top)SARS-COV-2 Vaccination 07/17/2019, 08/07/2019, 04/10/2020   Pfizer Covid-19 Vaccine Bivalent Booster 40yr & up 05/07/2021   Pneumococcal Conjugate-13 08/01/2014   Pneumococcal Polysaccharide-23 03/27/2004, 03/22/2012   Td 09/26/2007   Tdap 12/28/2012   Zoster Recombinat (Shingrix) 02/01/2018   Zoster, Live 02/02/2012, 02/01/2018, 05/13/2018     Objective: Vital Signs: BP (!) 163/80 (BP Location: Right Arm, Patient Position: Sitting, Cuff Size: Large)   Pulse 74   Resp 18   Ht _0  (1.473 m)   Wt 184 lb (83.5 kg)   BMI 38.46 kg/m    Physical  Exam Vitals and nursing note reviewed.  Constitutional:      Appearance: She is well-developed.  HENT:     Head: Normocephalic and atraumatic.  Eyes:     Conjunctiva/sclera: Conjunctivae normal.  Cardiovascular:     Rate and Rhythm: Normal rate and regular rhythm.  Heart sounds: Normal heart sounds.  Pulmonary:     Effort: Pulmonary effort is normal.     Breath sounds: Normal breath sounds.  Abdominal:     General: Bowel sounds are normal.     Palpations: Abdomen is soft.  Musculoskeletal:     Cervical back: Normal range of motion.  Lymphadenopathy:     Cervical: No cervical adenopathy.  Skin:    General: Skin is warm and dry.     Capillary Refill: Capillary refill takes less than 2 seconds.  Neurological:     Mental Status: She is alert and oriented to person, place, and time.  Psychiatric:        Behavior: Behavior normal.      Musculoskeletal Exam: C-spine was in good range of motion.  She had painful range of motion of bilateral shoulders.  Elbow joints, wrist joints with good range of motion.  She has severe PIP and DIP thickening bilaterally with subluxation of several of the PIP and DIP joints.  Hip joints were difficult to assess in the sitting position.  Left hip joint is replaced.  Knee joints are replaced and were in good range of motion.  She had no tenderness over ankles or MTPs.  She mobilizes with the help of a cane.  CDAI Exam: CDAI Score: -- Patient Global: --; Provider Global: -- Swollen: --; Tender: -- Joint Exam 12/11/2021   No joint exam has been documented for this visit   There is currently no information documented on the homunculus. Go to the Rheumatology activity and complete the homunculus joint exam.  Investigation: No additional findings.  Imaging: XR Hand 2 View Left  Result Date: 11/13/2021 Severe CMC narrowing and subluxation was noted.  PIP and DIP narrowing was noted.  Subluxation of first and second DIP joints was noted.  No  intercarpal or radiocarpal joint space narrowing was noted. Impression: These findings are consistent with severe osteoarthritis of the hand.  XR Hand 2 View Right  Result Date: 11/13/2021 Generalized osteopenia was noted.  Severe CMC narrowing and subluxation was noted.  Second MCP narrowing was noted.  Severe narrowing of all PIP and DIP joints.  Subluxation of third and fourth PIP joints was noted.  Subluxation of all DIP joints was noted.  No intercarpal or radiocarpal joint space narrowing was noted.  Erosive changes were noted in the first DIP and third PIP joints. Impression: These findings are consistent with severe erosive osteoarthritis of the hand.  XR Foot 2 Views Left  Result Date: 11/13/2021 First MTP, PIP and DIP narrowing was noted.  Intertarsal narrowing with dorsal spurring was noted.  Subtalar joint space narrowing was noted.  Inferior calcaneal spur was noted.  No erosive changes were noted. Impression: These findings are consistent with osteoarthritis of the foot.  XR Foot 2 Views Right  Result Date: 11/13/2021 Severe narrowing of first MTP and joint with cystic changes were noted.  PIP and DIP narrowing was noted.  No intertarsal narrowing was noted.  Subtalar narrowing was noted.  No erosive changes were noted.  Inferior and posterior calcaneal spurs were noted. Impression: These findings are consistent with osteoarthritis of the foot.   Recent Labs: Lab Results  Component Value Date   WBC 6.1 12/02/2021   HGB 10.1 (L) 12/02/2021   PLT 202 12/02/2021   NA 137 10/21/2021   K 4.7 10/21/2021   CL 97 (L) 10/21/2021   CO2 31 10/21/2021   GLUCOSE 187 (H) 10/21/2021   BUN 69 (  H) 10/21/2021   CREATININE 1.82 (H) 10/21/2021   BILITOT 0.6 11/12/2020   ALKPHOS 85 11/12/2020   AST 14 (L) 11/12/2020   ALT 9 11/12/2020   PROT 6.3 11/13/2021   ALBUMIN 3.9 10/21/2021   CALCIUM 9.3 10/21/2021   GFRAA 24 (L) 08/05/2020      Speciality Comments: No specialty comments  available.  Procedures:  No procedures performed Allergies: Norvasc [amlodipine besylate], Other, Amlodipine besylate, Avapro [irbesartan], Glucophage [metformin hydrochloride], Lisinopril, Losartan, Morphine and related, Simvastatin, and Trulicity [dulaglutide]   Assessment / Plan:     Visit Diagnoses: Sjogren's syndrome with other organ involvement ([place]) - She continues to have dry mouth and dry eyes.Nov 13, 2021 UA 1+ hemoglobin, trace protein, uric acid 7.0, ESR 39, RF negative,  beta-2 GP 1 negative, anticardiolipin negative, lupus anticoagulant negative, C3-C4 normal, ANA 1: 320NS, 1: 40 cytoplasmic positive, dsDNA positive, SSA positive,) SSB, Smith, RNP, SCL 70 negative), G6PD normal,  Lab results were discussed with the patient at length.  Her labs are consistent with Sjogren's syndrome with positive ANA and positive SSA antibody.  Double-stranded DNA is positive.  She does not have features of lupus.  She gives history of dry mouth and dry eyes.  Patient states she was diagnosed with autoimmune disease by Dr. Rockwell Alexandria in the past and was treated with hydroxychloroquine for many years.  She has not been on hydroxychloroquine for a long time.  Use of over-the-counter products were discussed.  I also briefly discussed the use of hydroxychloroquine.  Indications side effects contraindications were discussed at length.  I discussed the side effect of increased QT prolongation and with the patient.  Increased risk of ocular toxicity was discussed.  Decreasing the dose of Plaquenil with renal disease was also discussed.  Patient would like to discuss this further with the cardiologist before making a decision.  She states her symptoms are not been diagnosed to start on medication at this time.  I gave her a handout on hydroxychloroquine to review.  At this point patient feels that the risk of taking medication is higher than the benefit.  I was in agreement.  We can continue to monitor her labs over  time.  Primary osteoarthritis of both hands-she has severe osteoarthritis in her bilateral hands.  Joint protection muscle strengthening was discussed.  Avascular necrosis of bone of hip, left ([place])-  Status post left hip replacement-she continues to have some discomfort.  Status post total bilateral knee replacement-doing well.  Primary osteoarthritis of both feet-no synovitis was noted.  She continues to have pain and discomfort in her bilateral feet.  DDD (degenerative disc disease), lumbar-she has chronic pain.  History of gout - She is on Uloric 40 mg 3 times a week by her nephrologist.  She has not had any gout flares.  Age-related osteoporosis without current pathological fracture - ttd with Prolia in the past by Dr. Cruzita Lederer.  She does not want to take any treatment now .  Abnormal SPEP-IFE IgA lambda monoclonal protein detected hand.  Patient has been seeing Dr. Benay Spice.  She has a follow-up appointment.  Stage 3 chronic kidney disease, unspecified whether stage 3a or 3b CKD (HCC)-followed by nephrology.  Primary hypertension-systolic blood pressure was elevated.  Other medical problems are listed as follows:  Paroxysmal atrial fibrillation (HCC)  Sinus node dysfunction (HCC)  Pacemaker  Acute diastolic heart failure ([place])  Controlled type 2 diabetes mellitus with diabetic polyneuropathy, with long-term current use of insulin (HCC)  Polyneuropathy associated with  underlying disease ([place])  GAVE (gastric antral vascular ectasia)  Hiatal hernia  History of gastroesophageal reflux (GERD)  Diverticulosis  Benign esophageal stricture  Vitamin B12 deficiency  Anxiety and depression  History of glaucoma  Orders: No orders of the defined types were placed in this encounter.  No orders of the defined types were placed in this encounter.    Follow-Up Instructions: Return in about 6 weeks (around 01/22/2022) for Sjogren's, Osteoarthritis, Autoimmune  disease.   Bo Merino, MD  Note - This record has been created using Editor, commissioning.  Chart creation errors have been sought, but may not always  have been located. Such creation errors do not reflect on  the standard of medical care.

## 2021-12-11 ENCOUNTER — Ambulatory Visit (INDEPENDENT_AMBULATORY_CARE_PROVIDER_SITE_OTHER): Payer: Medicare Other | Admitting: Rheumatology

## 2021-12-11 ENCOUNTER — Encounter: Payer: Self-pay | Admitting: Rheumatology

## 2021-12-11 VITALS — BP 163/80 | HR 74 | Resp 18 | Ht <= 58 in | Wt 184.0 lb

## 2021-12-11 DIAGNOSIS — Z8739 Personal history of other diseases of the musculoskeletal system and connective tissue: Secondary | ICD-10-CM

## 2021-12-11 DIAGNOSIS — I10 Essential (primary) hypertension: Secondary | ICD-10-CM

## 2021-12-11 DIAGNOSIS — I495 Sick sinus syndrome: Secondary | ICD-10-CM

## 2021-12-11 DIAGNOSIS — M19041 Primary osteoarthritis, right hand: Secondary | ICD-10-CM | POA: Diagnosis not present

## 2021-12-11 DIAGNOSIS — I480 Paroxysmal atrial fibrillation: Secondary | ICD-10-CM

## 2021-12-11 DIAGNOSIS — M51369 Other intervertebral disc degeneration, lumbar region without mention of lumbar back pain or lower extremity pain: Secondary | ICD-10-CM

## 2021-12-11 DIAGNOSIS — Z79899 Other long term (current) drug therapy: Secondary | ICD-10-CM

## 2021-12-11 DIAGNOSIS — N183 Chronic kidney disease, stage 3 unspecified: Secondary | ICD-10-CM

## 2021-12-11 DIAGNOSIS — F419 Anxiety disorder, unspecified: Secondary | ICD-10-CM

## 2021-12-11 DIAGNOSIS — M87052 Idiopathic aseptic necrosis of left femur: Secondary | ICD-10-CM | POA: Diagnosis not present

## 2021-12-11 DIAGNOSIS — M19072 Primary osteoarthritis, left ankle and foot: Secondary | ICD-10-CM

## 2021-12-11 DIAGNOSIS — Z794 Long term (current) use of insulin: Secondary | ICD-10-CM

## 2021-12-11 DIAGNOSIS — G63 Polyneuropathy in diseases classified elsewhere: Secondary | ICD-10-CM

## 2021-12-11 DIAGNOSIS — Z96642 Presence of left artificial hip joint: Secondary | ICD-10-CM

## 2021-12-11 DIAGNOSIS — M3509 Sicca syndrome with other organ involvement: Secondary | ICD-10-CM | POA: Diagnosis not present

## 2021-12-11 DIAGNOSIS — E1142 Type 2 diabetes mellitus with diabetic polyneuropathy: Secondary | ICD-10-CM

## 2021-12-11 DIAGNOSIS — K449 Diaphragmatic hernia without obstruction or gangrene: Secondary | ICD-10-CM

## 2021-12-11 DIAGNOSIS — Z8719 Personal history of other diseases of the digestive system: Secondary | ICD-10-CM

## 2021-12-11 DIAGNOSIS — M19042 Primary osteoarthritis, left hand: Secondary | ICD-10-CM

## 2021-12-11 DIAGNOSIS — Z8669 Personal history of other diseases of the nervous system and sense organs: Secondary | ICD-10-CM

## 2021-12-11 DIAGNOSIS — Z950 Presence of cardiac pacemaker: Secondary | ICD-10-CM

## 2021-12-11 DIAGNOSIS — I5031 Acute diastolic (congestive) heart failure: Secondary | ICD-10-CM

## 2021-12-11 DIAGNOSIS — M19071 Primary osteoarthritis, right ankle and foot: Secondary | ICD-10-CM

## 2021-12-11 DIAGNOSIS — K222 Esophageal obstruction: Secondary | ICD-10-CM

## 2021-12-11 DIAGNOSIS — K31819 Angiodysplasia of stomach and duodenum without bleeding: Secondary | ICD-10-CM

## 2021-12-11 DIAGNOSIS — E538 Deficiency of other specified B group vitamins: Secondary | ICD-10-CM

## 2021-12-11 DIAGNOSIS — M810 Age-related osteoporosis without current pathological fracture: Secondary | ICD-10-CM

## 2021-12-11 DIAGNOSIS — Z96653 Presence of artificial knee joint, bilateral: Secondary | ICD-10-CM

## 2021-12-11 DIAGNOSIS — R778 Other specified abnormalities of plasma proteins: Secondary | ICD-10-CM

## 2021-12-11 DIAGNOSIS — K5790 Diverticulosis of intestine, part unspecified, without perforation or abscess without bleeding: Secondary | ICD-10-CM

## 2021-12-11 DIAGNOSIS — F32A Depression, unspecified: Secondary | ICD-10-CM

## 2021-12-11 DIAGNOSIS — M5136 Other intervertebral disc degeneration, lumbar region: Secondary | ICD-10-CM

## 2021-12-11 NOTE — Patient Instructions (Addendum)
Hydroxychloroquine Tablets What is this medication? HYDROXYCHLOROQUINE (hye drox ee KLOR oh kwin) treats autoimmune conditions, such as rheumatoid arthritis and lupus. It works by slowing down an overactive immune system. It may also be used to prevent and treat malaria. It works by killing the parasite that causes malaria. It belongs to a group of medications called DMARDs. This medicine may be used for other purposes; ask your health care provider or pharmacist if you have questions. COMMON BRAND NAME(S): Plaquenil, Quineprox What should I tell my care team before I take this medication? They need to know if you have any of these conditions: Diabetes Eye disease, vision problems G6PD deficiency Heart disease History of irregular heartbeat If you often drink alcohol Kidney disease Liver disease Porphyria Psoriasis An unusual or allergic reaction to chloroquine, hydroxychloroquine, other medications, foods, dyes, or preservatives Pregnant or trying to get pregnant Breast-feeding How should I use this medication? Take this medication by mouth with a glass of water. Take it as directed on the prescription label. Do not cut, crush or chew this medication. Swallow the tablets whole. Take it with food. Do not take it more than directed. Take all of this medication unless your care team tells you to stop it early. Keep taking it even if you think you are better. Take products with antacids in them at a different time of day than this medication. Take this medication 4 hours before or 4 hours after antacids. Talk to your care team if you have questions. Talk to your care team about the use of this medication in children. While this medication may be prescribed for selected conditions, precautions do apply. Overdosage: If you think you have taken too much of this medicine contact a poison control center or emergency room at once. NOTE: This medicine is only for you. Do not share this medicine with  others. What if I miss a dose? If you miss a dose, take it as soon as you can. If it is almost time for your next dose, take only that dose. Do not take double or extra doses. What may interact with this medication? Do not take this medication with any of the following: Cisapride Dronedarone Pimozide Thioridazine This medication may also interact with the following: Ampicillin Antacids Cimetidine Cyclosporine Digoxin Kaolin Medications for diabetes, like insulin, glipizide, glyburide Medications for seizures like carbamazepine, phenobarbital, phenytoin Mefloquine Methotrexate Other medications that prolong the QT interval (cause an abnormal heart rhythm) Praziquantel This list may not describe all possible interactions. Give your health care provider a list of all the medicines, herbs, non-prescription drugs, or dietary supplements you use. Also tell them if you smoke, drink alcohol, or use illegal drugs. Some items may interact with your medicine. What should I watch for while using this medication? Visit your care team for regular checks on your progress. Tell your care team if your symptoms do not start to get better or if they get worse. You may need blood work done while you are taking this medication. If you take other medications that can affect heart rhythm, you may need more testing. Talk to your care team if you have questions. Your vision may be tested before and during use of this medication. Tell your care team right away if you have any change in your eyesight. This medication may cause serious skin reactions. They can happen weeks to months after starting the medication. Contact your care team right away if you notice fevers or flu-like symptoms with a rash. The  rash may be red or purple and then turn into blisters or peeling of the skin. Or, you might notice a red rash with swelling of the face, lips or lymph nodes in your neck or under your arms. If you or your family  notice any changes in your behavior, such as new or worsening depression, thoughts of harming yourself, anxiety, or other unusual or disturbing thoughts, or memory loss, call your care team right away. What side effects may I notice from receiving this medication? Side effects that you should report to your care team as soon as possible: Allergic reactions--skin rash, itching, hives, swelling of the face, lips, tongue, or throat Aplastic anemia--unusual weakness or fatigue, dizziness, headache, trouble breathing, increased bleeding or bruising Change in vision Heart rhythm changes--fast or irregular heartbeat, dizziness, feeling faint or lightheaded, chest pain, trouble breathing Infection--fever, chills, cough, or sore throat Low blood sugar (hypoglycemia)--tremors or shaking, anxiety, sweating, cold or clammy skin, confusion, dizziness, rapid heartbeat Muscle injury--unusual weakness or fatigue, muscle pain, dark yellow or brown urine, decrease in amount of urine Pain, tingling, or numbness in the hands or feet Rash, fever, and swollen lymph nodes Redness, blistering, peeling, or loosening of the skin, including inside the mouth Thoughts of suicide or self-harm, worsening mood, or feelings of depression Unusual bruising or bleeding Side effects that usually do not require medical attention (report to your care team if they continue or are bothersome): Diarrhea Headache Nausea Stomach pain Vomiting This list may not describe all possible side effects. Call your doctor for medical advice about side effects. You may report side effects to FDA at 1-800-FDA-1088. Where should I keep my medication? Keep out of the reach of children and pets. Store at room temperature up to 30 degrees C (86 degrees F). Protect from light. Get rid of any unused medication after the expiration date. To get rid of medications that are no longer needed or have expired: Take the medication to a medication take-back  program. Check with your pharmacy or law enforcement to find a location. If you cannot return the medication, check the label or package insert to see if the medication should be thrown out in the garbage or flushed down the toilet. If you are not sure, ask your care team. If it is safe to put it in the trash, empty the medication out of the container. Mix the medication with cat litter, dirt, coffee grounds, or other unwanted substance. Seal the mixture in a bag or container. Put it in the trash. NOTE: This sheet is a summary. It may not cover all possible information. If you have questions about this medicine, talk to your doctor, pharmacist, or health care provider.  2023 Elsevier/Gold Standard (2020-10-24 00:00:00)    Vaccines You are taking a medication(s) that can suppress your immune system.  The following immunizations are recommended: Flu annually Covid-19  Td/Tdap (tetanus, diphtheria, pertussis) every 10 years Pneumonia (Prevnar 15 then Pneumovax 23 at least 1 year apart.  Alternatively, can take Prevnar 20 without needing additional dose) Shingrix: 2 doses from 4 weeks to 6 months apart  Please check with your PCP to make sure you are up to date.   Sjogren's Syndrome Sjgren's syndrome is an inflammatory disease in which the body's disease-fighting system (immune system) attacks the glands that produce tears (lacrimal glands) and the glands that produce saliva (salivary glands). This makes the eyes and mouth very dry. Sjgren's syndrome can also affect other parts of the body, causing dryness of  the skin, nose, throat, and vagina. Sjgren's syndrome is a long-term (chronic) disorder that has no cure. In some cases, it is linked to other disorders (rheumatic disorders), such as rheumatoid arthritis and systemic lupus erythematosus (SLE). It may affect other parts of the body, such as the: Blood vessels. Joints. Lungs. Kidneys. Liver or pancreas. Brain, nerves, or spinal  cord. What are the causes? The cause of this condition is not known. It may be passed along from parent to child (inherited), or it may be a symptom of a rheumatic disorder. What increases the risk? This condition is more likely to develop in: Women. People who are 42-74 years old and older. People who have recently had a viral infection or currently have a viral infection. What are the signs or symptoms? The main symptoms of this condition are: Dry mouth. This may include: A chalky feeling. Difficulty swallowing, speaking, or tasting. Frequent cavities in the teeth. Frequent mouth infections. Dry eyes. This may include: Burning, redness, and itching. Blurry vision. Fluctuating vision. Light sensitivity. Other symptoms may include: Dryness of the skin and the inside of the nose. Eyelid infections. Vaginal dryness (if applicable). Joint pain and stiffness. Muscle pain and stiffness. How is this diagnosed? This condition is diagnosed based on: Your symptoms. Your medical history. A physical exam of your eyes and mouth. Tests, including: A Schirmer test. This tests your tear production. An eye exam that is done with a magnifying device (slit-lamp exam). An eye test that temporarily stains your eye with special dyes. This shows the extent of eye damage. Tests to check your salivary gland function. Biopsy. This is a removal of part of a salivary gland from inside your lower lip to be studied under a microscope. Chest X-rays. Blood or urine tests. How is this treated? There is no cure for this condition, but treatment can help you manage your symptoms. You may be asked to see a rheumatologist for further evaluation and treatment. This condition may be treated with: Medicines to help relieve pain and stiffness. Medicines to help relieve inflammation in your body (corticosteroids). These are usually for severe cases. Medicines to help reduce the activity of your immune system  (immunosuppressants). These are usually prescribed by your health care provider or a rheumatologist. Moisture replacement therapies to help relieve dryness in your skin, mouth, and eyes. Dry eyes may be treated with: Eye drops or nasal sprays to improve dryness of the eyes. Surgery or insertion of plugs to close the lacrimal glands (punctal occlusion). This helps keep more natural tears in your eyes. Soft contact lenses or hard scleral lenses. These are occasionally used to protect the surface of the eye. Biologic lubricating eye drops (serum tears). These are eye drops made from a person's own blood. They are used in some people with severe dry eye. Follow these instructions at home: Eye care  Use eye drops and other medicines as told by your health care provider. Protect your eyes from the sun and wind with sunglasses or glasses. Blink at least 5-6 times a minute. Maintain properly humidified air. You may want to use a humidifier at home and at work. Avoid smoke. Mouth care Brush your teeth and floss after every meal. Chew sugar-free gum or suck on hard candy. This may help to relieve dry mouth. Use antimicrobial mouthwash daily. Take frequent sips of water or sugar-free drinks. Use saliva substitutes or lip balm as told by your health care provider. See your dentist every 6 months. General instructions  Take over-the-counter and prescription medicines only as told by your health care provider. Drink enough fluid to keep your urine pale yellow. Keep all follow-up visits. This is important. Contact a health care provider if: You have a fever. You have night sweats. You are always tired. You have unexplained weight loss. You develop itchy skin. You have red patches on your skin. You have a lump or swelling on your neck. Get help right away if: You develop severe eye pain. You develop sudden decreased vision. Summary Sjgren's syndrome is a disease in which the body's immune  system attacks the glands that produce tears and the glands that produce saliva. This condition makes the eyes and mouth very dry. Sjgren's syndrome is a long-term (chronic) disorder. There is no cure for this condition, but treatment can help you manage your symptoms. The cause of this condition is not known. You may be asked to see a rheumatologist for further evaluation and treatment. This information is not intended to replace advice given to you by your health care provider. Make sure you discuss any questions you have with your health care provider. Document Revised: 01/06/2021 Document Reviewed: 01/06/2021 Elsevier Patient Education  [place].

## 2021-12-22 ENCOUNTER — Ambulatory Visit: Payer: Medicare Other

## 2021-12-22 ENCOUNTER — Other Ambulatory Visit: Payer: Medicare Other

## 2022-01-13 ENCOUNTER — Encounter: Payer: Self-pay | Admitting: Nurse Practitioner

## 2022-01-13 ENCOUNTER — Inpatient Hospital Stay: Payer: Medicare Other | Attending: Oncology

## 2022-01-13 ENCOUNTER — Inpatient Hospital Stay: Payer: Medicare Other

## 2022-01-13 ENCOUNTER — Inpatient Hospital Stay (HOSPITAL_BASED_OUTPATIENT_CLINIC_OR_DEPARTMENT_OTHER): Payer: Medicare Other | Admitting: Nurse Practitioner

## 2022-01-13 VITALS — BP 152/71 | HR 87 | Temp 98.2°F | Resp 20 | Ht <= 58 in | Wt 190.6 lb

## 2022-01-13 DIAGNOSIS — N183 Chronic kidney disease, stage 3 unspecified: Secondary | ICD-10-CM | POA: Insufficient documentation

## 2022-01-13 DIAGNOSIS — D631 Anemia in chronic kidney disease: Secondary | ICD-10-CM | POA: Insufficient documentation

## 2022-01-13 DIAGNOSIS — N189 Chronic kidney disease, unspecified: Secondary | ICD-10-CM | POA: Diagnosis not present

## 2022-01-13 LAB — CBC WITH DIFFERENTIAL (CANCER CENTER ONLY)
Abs Immature Granulocytes: 0.03 10*3/uL (ref 0.00–0.07)
Basophils Absolute: 0 10*3/uL (ref 0.0–0.1)
Basophils Relative: 0 %
Eosinophils Absolute: 0 10*3/uL (ref 0.0–0.5)
Eosinophils Relative: 0 %
HCT: 30.6 % — ABNORMAL LOW (ref 36.0–46.0)
Hemoglobin: 9.7 g/dL — ABNORMAL LOW (ref 12.0–15.0)
Immature Granulocytes: 1 %
Lymphocytes Relative: 6 %
Lymphs Abs: 0.4 10*3/uL — ABNORMAL LOW (ref 0.7–4.0)
MCH: 31.6 pg (ref 26.0–34.0)
MCHC: 31.7 g/dL (ref 30.0–36.0)
MCV: 99.7 fL (ref 80.0–100.0)
Monocytes Absolute: 0.4 10*3/uL (ref 0.1–1.0)
Monocytes Relative: 7 %
Neutro Abs: 5.1 10*3/uL (ref 1.7–7.7)
Neutrophils Relative %: 86 %
Platelet Count: 173 10*3/uL (ref 150–400)
RBC: 3.07 MIL/uL — ABNORMAL LOW (ref 3.87–5.11)
RDW: 14.9 % (ref 11.5–15.5)
WBC Count: 5.9 10*3/uL (ref 4.0–10.5)
nRBC: 0 % (ref 0.0–0.2)

## 2022-01-13 LAB — FERRITIN: Ferritin: 95 ng/mL (ref 11–307)

## 2022-01-13 LAB — IRON AND TIBC
Iron: 67 ug/dL (ref 28–170)
Saturation Ratios: 20 % (ref 10.4–31.8)
TIBC: 340 ug/dL (ref 250–450)
UIBC: 273 ug/dL

## 2022-01-13 LAB — RENAL FUNCTION PANEL
Albumin: 4 g/dL (ref 3.5–5.0)
Anion gap: 7 (ref 5–15)
BUN: 62 mg/dL — ABNORMAL HIGH (ref 8–23)
CO2: 30 mmol/L (ref 22–32)
Calcium: 9.5 mg/dL (ref 8.9–10.3)
Chloride: 99 mmol/L (ref 98–111)
Creatinine, Ser: 2 mg/dL — ABNORMAL HIGH (ref 0.44–1.00)
GFR, Estimated: 25 mL/min — ABNORMAL LOW (ref >60)
Glucose, Bld: 182 mg/dL — ABNORMAL HIGH (ref 70–99)
Phosphorus: 4.6 mg/dL (ref 2.5–4.6)
Potassium: 4.8 mmol/L (ref 3.5–5.1)
Sodium: 136 mmol/L (ref 135–145)

## 2022-01-13 MED ORDER — EPOETIN ALFA-EPBX 40000 UNIT/ML IJ SOLN
40000.0000 [IU] | Freq: Once | INTRAMUSCULAR | Status: AC
  Administered 2022-01-13: 40000 [IU] via SUBCUTANEOUS

## 2022-01-13 MED ORDER — EPOETIN ALFA-EPBX 10000 UNIT/ML IJ SOLN
20000.0000 [IU] | Freq: Once | INTRAMUSCULAR | Status: DC
  Administered 2022-01-13: 20000 [IU] via SUBCUTANEOUS

## 2022-01-13 NOTE — Progress Notes (Signed)
Per Lattie Haw it is okay to give the retacrit with a B/P 152/71

## 2022-01-13 NOTE — Progress Notes (Signed)
[facility] OFFICE PROGRESS NOTE   Diagnosis: Anemia, chronic renal failure, GI bleeding  INTERVAL HISTORY:   Tricia Gonzales returns as scheduled.  She continues every 6 weeks erythropoietin therapy.  She continues oral iron.  No bleeding.  She continues to have intermittent abdominal pain and bowel issues.    Objective:  Vital signs in last 24 hours:  Blood pressure (!) 152/71, pulse 87, temperature 98.2 F (36.8 C), temperature source Oral, resp. rate 20, height 4' 10" (1.473 m), weight 190 lb 9.6 oz (86.5 kg), SpO2 92 %.    Resp: Faint rales at both lung bases.  No respiratory distress. Cardio: Regular rate and rhythm. GI: No hepatosplenomegaly. Vascular: Trace lower leg edema bilaterally, right slightly greater than left.  Lab Results:  Lab Results  Component Value Date   WBC 5.9 01/13/2022   HGB 9.7 (L) 01/13/2022   HCT 30.6 (L) 01/13/2022   MCV 99.7 01/13/2022   PLT 173 01/13/2022   NEUTROABS 5.1 01/13/2022    Imaging:  No results found.  Medications: I have reviewed the patient's current medications.  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; quantitative immunoglobulins show IgG mildly decreased 654 (range [phone]), IgA and IgM in normal range; polyclonal serum light chain elevation 11/15/2015-hemoglobin 11.2 11/29/2015-hemoglobin 11.1 12/12/2005-hemoglobin 10.4; Aranesp 100 g every 2 weeks initiated 02/07/2016-hemoglobin 11.0, Aranesp held, changed to an every 3 week schedule Progressive anemia and decreased  iron stores December 2017, treated with IV iron January 2018 with improvement She did not require Aranesp for 2 months and resumed Aranesp on a monthly schedule for 08/2016 Aranesp every 6 weeks beginning 05/04/2017 Progressive anemia and decreased iron stores 07/05/2017; IV iron  given 07/28/2017 and 08/06/2017 Persistent severe anemia, Aranesp change to every 2 weeks beginning 10/19/2017 Persistent anemia, decreased iron stores 11/30/2017 IV iron 12/13/2017, 12/20/2017, Decreased iron stores 05/17/2018  IV iron 06/24/2018 07/05/2018- no serum M spike, polyclonal serum light chain elevation, IgG mildly decreased with IgA and IgM within normal range  2 units of blood 08/17/2018 IV iron 08/17/2018 and 08/24/2018 Stool heme +08/30/2018, referred to GI 2 units of blood 09/01/2018 1 unit of blood 10/25/2018 11/11/2018 colonoscopy- diverticulosis in the entire examined colon.  End-to-end colocolonic anastomosis.  One 5 mm polyp in the ascending colon.  No cause for anemia. 11/11/2018 upper endoscopy- evidence of gastric antral vascular ectasia which is the likely cause of the patient's worsening anemia. 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 Hemoglobin stabilized following treatment of the Gave Every 6-week erythropoietin 06/22/2019, erythropoietin dose increased to 60,000 units 09/14/2019 Erythropoietin 60,000 units every 3 weeks 10/24/2019 Schedule changed to every 4 weeks 12/15/2019 IV iron 02/21/2020 and 02/29/2020, 06/25/2020, 07/03/2020 Erythropoietin adjusted to every 2 weeks beginning 05/23/2020 Erythropoietin changed to every 3 weeks beginning 11/12/2020 Erythropoietin continued every 3 weeks 01/14/2021 Erythropoietin changed to every 6 weeks 03/18/2021   2.   Chronic renal insufficiency   3.    Atrial fibrillation-maintained on apixaban   4.    Osteoarthritis   5.     Diabetes   6.     Sjogren's disease   7.  Pericarditis  February 2017   8.     Colonoscopy 01/07/2012-prior segmental colectomy in the sigmoid colon; moderate diverticulosis throughout the colon; otherwise normal examination    Disposition: Tricia Gonzales is stable from a hematologic standpoint.  She will continue erythropoietin every 6 weeks, oral iron.  We will follow-up on the iron studies from today.  Plan for IV iron if needed.  I encouraged her to contact [name] to discuss the abdominal pain and bowel problems.  We will see her in follow-up in approximately 18 weeks.    Ned Card ANP/GNP-BC   01/13/2022  1:22 PM

## 2022-01-13 NOTE — Patient Instructions (Signed)
Epoetin Alfa injection What is this medication? EPOETIN ALFA (e POE e tin AL fa) helps your body make more red blood cells. This medicine is used to treat anemia caused by chronic kidney disease, cancer chemotherapy, or HIV-therapy. It may also be used before surgery if you have anemia. This medicine may be used for other purposes; ask your health care provider or pharmacist if you have questions. COMMON BRAND NAME(S): Epogen, Procrit, Retacrit What should I tell my care team before I take this medication? They need to know if you have any of these conditions: cancer heart disease high blood pressure history of blood clots history of stroke low levels of folate, iron, or vitamin B12 in the blood seizures an unusual or allergic reaction to erythropoietin, albumin, benzyl alcohol, hamster proteins, other medicines, foods, dyes, or preservatives pregnant or trying to get pregnant breast-feeding How should I use this medication? This medicine is for injection into a vein or under the skin. It is usually given by a health care professional in a hospital or clinic setting. If you get this medicine at home, you will be taught how to prepare and give this medicine. Use exactly as directed. Take your medicine at regular intervals. Do not take your medicine more often than directed. It is important that you put your used needles and syringes in a special sharps container. Do not put them in a trash can. If you do not have a sharps container, call your pharmacist or healthcare provider to get one. A special MedGuide will be given to you by the pharmacist with each prescription and refill. Be sure to read this information carefully each time. Talk to your pediatrician regarding the use of this medicine in children. While this drug may be prescribed for selected conditions, precautions do apply. Overdosage: If you think you have taken too much of this medicine contact a poison control center or emergency  room at once. NOTE: This medicine is only for you. Do not share this medicine with others. What if I miss a dose? If you miss a dose, take it as soon as you can. If it is almost time for your next dose, take only that dose. Do not take double or extra doses. What may interact with this medication? Interactions have not been studied. This list may not describe all possible interactions. Give your health care provider a list of all the medicines, herbs, non-prescription drugs, or dietary supplements you use. Also tell them if you smoke, drink alcohol, or use illegal drugs. Some items may interact with your medicine. What should I watch for while using this medication? Your condition will be monitored carefully while you are receiving this medicine. You may need blood work done while you are taking this medicine. This medicine may cause a decrease in vitamin B6. You should make sure that you get enough vitamin B6 while you are taking this medicine. Discuss the foods you eat and the vitamins you take with your health care professional. What side effects may I notice from receiving this medication? Side effects that you should report to your doctor or health care professional as soon as possible: allergic reactions like skin rash, itching or hives, swelling of the face, lips, or tongue seizures signs and symptoms of a blood clot such as breathing problems; changes in vision; chest pain; severe, sudden headache; pain, swelling, warmth in the leg; trouble speaking; sudden numbness or weakness of the face, arm or leg signs and symptoms of a stroke like  changes in vision; confusion; trouble speaking or understanding; severe headaches; sudden numbness or weakness of the face, arm or leg; trouble walking; dizziness; loss of balance or coordination ?Side effects that usually do not require medical attention (report to your doctor or health care professional if they continue or are  bothersome): ?chills ?cough ?dizziness ?fever ?headaches ?joint pain ?muscle cramps ?muscle pain ?nausea, vomiting ?pain, redness, or irritation at site where injected ?This list may not describe all possible side effects. Call your doctor for medical advice about side effects. You may report side effects to FDA at 1-800-FDA-1088. ?Where should I keep my medication? ?Keep out of the reach of children. ?Store in a refrigerator between 2 and 8 degrees C (36 and 46 degrees F). Do not freeze or shake. Throw away any unused portion if using a single-dose vial. Multi-dose vials can be kept in the refrigerator for up to 21 days after the initial dose. Throw away unused medicine. ?NOTE: This sheet is a summary. It may not cover all possible information. If you have questions about this medicine, talk to your doctor, pharmacist, or health care provider. ?? 2023 Elsevier/Gold Standard (2017-02-09 00:00:00) ? ?

## 2022-01-13 NOTE — Progress Notes (Signed)
Office Visit Note  Patient: Tricia Gonzales             Date of Birth: 09-06-1940           MRN: [id number]             PCP: Carol Ada, MD Referring: Carol Ada, MD Visit Date: 01/27/2022 Occupation: _0 @  Subjective:  Discuss plaquenil   History of Present Illness: Tricia Gonzales is a 81 y.o. female with history of sjogren's syndrome and osteoarthritis. Plaquenil was discussed at her last office visit on 12/11/21 but she declined restarting at that time. She remains hesitant to restart on plaquenil at this time due to potential side effects.  The possible risks outweigh possible benefits for her.  She continues to have chronic sicca symptoms.  She has tried using biotene products and xylimelt for dry mouth. She is seeing her dentist every 6 months as advised. She is also using eye drops as needed for symptomatic relief. She is seeing her ophthalmologist every 3-4 months.  She continues to have ongoing arthralgias and joint stiffness involving multiple joints.  She notices intermittent pain and swelling in both hands due to underlying osteoarthritis.  She states she also has chronic pain in her left knee replacement and has been using a cane to assist with ambulation.  She denies any recent falls.    Activities of Daily Living:  Patient reports morning stiffness for 2 hours.   Patient Reports nocturnal pain.  Difficulty dressing/grooming: Reports Difficulty climbing stairs: Reports Difficulty getting out of chair: Reports Difficulty using hands for taps, buttons, cutlery, and/or writing: Reports  Review of Systems  Constitutional:  Positive for fatigue.  HENT:  Positive for mouth sores and mouth dryness.   Eyes:  Positive for dryness.  Respiratory:  Positive for shortness of breath.   Cardiovascular:  Negative for chest pain and palpitations.  Gastrointestinal:  Positive for diarrhea. Negative for blood in stool and constipation.  Endocrine: Negative for increased  urination.  Genitourinary:  Negative for involuntary urination.  Musculoskeletal:  Positive for joint pain, joint pain, joint swelling, myalgias, muscle weakness, morning stiffness, muscle tenderness and myalgias.  Skin:  Negative for color change, rash, hair loss and sensitivity to sunlight.  Allergic/Immunologic: Negative for susceptible to infections.  Neurological:  Positive for headaches. Negative for dizziness.  Hematological:  Negative for swollen glands.  Psychiatric/Behavioral:  Positive for sleep disturbance. Negative for depressed mood. The patient is not nervous/anxious.     PMFS History:  Patient Active Problem List   Diagnosis Date Noted   Benign esophageal stricture    Acute blood loss anemia    Symptomatic anemia    Acute diastolic heart failure ([place]) 08/15/2020   AKI (acute kidney injury) ([place]) 08/15/2020   CHF (congestive heart failure) ([place]) 08/15/2020   Atrial flutter (HCC)    Sinus node dysfunction ([place]) 09/06/2019   Status post left hip replacement 06/28/2019   Urinary retention 05/12/2019   Polyneuropathy associated with underlying disease ([place]) 05/12/2019   Immobility 04/26/2019   Low ferritin 04/24/2019   Primary osteoarthritis of left hip 03/29/2019   GAVE (gastric antral vascular ectasia)    Avascular necrosis of bone of hip, left ([place]) 08/22/2018   Vitamin B12 deficiency    Type II diabetes mellitus ([place])    Sjogren's disease ([place])    Presence of permanent cardiac pacemaker    Pericarditis    Osteoarthritis    Obesity    Kidney stones  Intestinal obstruction (HCC)    History of gout    Hiatal hernia    History of blood transfusion    Heart murmur    GERD (gastroesophageal reflux disease)    Gastritis    Esophageal stricture    Early cataracts, bilateral    Diverticulosis    Depression    Chronic kidney disease (CKD), stage III (moderate) (HCC)    Chronic back pain    Atrial fibrillation (HCC)    Arthritis    Anxiety    Anemia     Osteoarthritis of finger of right hand 01/22/2016   Pain 01/22/2016   Primary osteoarthritis of both first carpometacarpal joints 01/22/2016   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/2015   Chest pain at rest    Heme positive stool    UTI (urinary tract infection) 08/09/2015   Acute pericarditis    Chest pain 08/06/2015   Diabetes mellitus type 2, controlled ([place]) 08/06/2015   Hyperlipidemia 08/06/2015   Hypertension 08/06/2015   CKD (chronic kidney disease) stage 3, GFR 30-59 ml/min (HCC) 08/06/2015   Chronic anemia 08/06/2015   Hyperkalemia    Pain in the chest    Pacemaker    Junctional bradycardia 06/17/2015   Osteoporosis 03/22/2015   Abdominal pain, left lower quadrant 01/07/2012   Syncope 09/15/2011   Junctional escape rhythm 09/15/2011   PELVIC MASS 03/27/2009   ABSCESS OF INTESTINE 02/19/2009   VITAMIN B12 DEFICIENCY 11/21/2007   OBESITY 11/21/2007   ESOPHAGEAL STRICTURE 11/21/2007   HIATAL HERNIA 11/21/2007   UNSPECIFIED INTESTINAL OBSTRUCTION 11/21/2007   DIVERTICULOSIS OF COLON 11/21/2007   OSTEOARTHRITIS 11/21/2007   DIABETES MELLITUS, HX OF 11/21/2007   ANEMIA, HX OF 11/21/2007   HYPERTENSION, HX OF 11/21/2007   REFLUX ESOPHAGITIS, HX OF 11/21/2007    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   Anxiety    Arthritis    Atrial fibrillation ([place])    Avascular necrosis ([place]) hip left   leg pain also   Chronic back pain    Chronic kidney disease (CKD), stage III (moderate) (HCC)    Depression    Diverticulosis    Early cataracts, bilateral    Esophageal stricture    Gastritis    GAVE (gastric antral vascular ectasia)    GERD (gastroesophageal reflux disease)    Glaucoma    Heart murmur     " some doctors say that I have one some say that I don"t "   Hiatal hernia    History of blood transfusion    "@ least w/1st knee OR"   History of gout    History of kidney stones    Hyperlipidemia     Hypertension    Intestinal obstruction ([place])    Kidney stones    Obesity    Osteoarthritis    Osteoporosis    Pericarditis 2016   Presence of permanent cardiac pacemaker    Sjogren's disease ([place])    Thyroid disease    Type II diabetes mellitus ([place])    Vitamin B12 deficiency     Family History  Problem Relation Age of Onset   Diabetes Mother    Hypertension Sister    Diabetes Brother    Colon cancer Neg Hx    Heart attack Neg Hx    Stroke Neg Hx    Colon polyps Neg Hx    Esophageal cancer Neg Hx    Rectal cancer Neg Hx  Stomach cancer Neg Hx    Past Surgical History:  Procedure Laterality Date   ABDOMINAL HYSTERECTOMY     complete   CARDIOVERSION N/A 07/31/2020   Procedure: CARDIOVERSION;  Surgeon: Werner Lean, MD;  Location: [facility];  Service: Cardiovascular;  Laterality: N/A;   CHOLECYSTECTOMY OPEN     COLECTOMY     for rectovaginal fistula   COLONOSCOPY  01/07/2012   Procedure: COLONOSCOPY;  Surgeon: Lafayette Dragon, MD;  Location: [facility];  Service: Endoscopy;  Laterality: N/A;   EP IMPLANTABLE DEVICE N/A 06/18/2015   Procedure: Pacemaker Implant;  Surgeon: Evans Lance, MD;  Location: [facility];  Service: Cardiovascular;  Laterality: N/A;   ESOPHAGEAL DILATION  08/17/2020   Procedure: ESOPHAGEAL DILATION;  Surgeon: Lavena Bullion, DO;  Location: [facility];  Service: Gastroenterology;;   ESOPHAGOGASTRODUODENOSCOPY N/A 11/24/2018   Procedure: ESOPHAGOGASTRODUODENOSCOPY (EGD);  Surgeon: Yetta Flock, MD;  Location: [facility];  Service: Gastroenterology;  Laterality: N/A;   ESOPHAGOGASTRODUODENOSCOPY (EGD) WITH ESOPHAGEAL DILATION     ESOPHAGOGASTRODUODENOSCOPY (EGD) WITH PROPOFOL N/A 08/12/2015   Procedure: ESOPHAGOGASTRODUODENOSCOPY (EGD) WITH PROPOFOL;  Surgeon: Manus Gunning, MD;  Location: [facility];  Service: Gastroenterology;  Laterality: N/A;   ESOPHAGOGASTRODUODENOSCOPY (EGD) WITH PROPOFOL N/A 08/17/2020    Procedure: ESOPHAGOGASTRODUODENOSCOPY (EGD) WITH PROPOFOL;  Surgeon: Lavena Bullion, DO;  Location: [facility];  Service: Gastroenterology;  Laterality: N/A;   EYE SURGERY Bilateral    cataracts   HOT HEMOSTASIS N/A 11/24/2018   Procedure: HOT HEMOSTASIS (ARGON PLASMA COAGULATION/BICAP);  Surgeon: Yetta Flock, MD;  Location: [facility];  Service: Gastroenterology;  Laterality: N/A;   HOT HEMOSTASIS N/A 08/17/2020   Procedure: HOT HEMOSTASIS (ARGON PLASMA COAGULATION/BICAP);  Surgeon: Lavena Bullion, DO;  Location: [facility];  Service: Gastroenterology;  Laterality: N/A;   I & D EXTREMITY Right 08/06/2016   Procedure: DEBRIDEMENT PIP RIGHT RING FINGER;  Surgeon: Daryll Brod, MD;  Location: [facility];  Service: Orthopedics;  Laterality: Right;   INSERT / REPLACE / REMOVE PACEMAKER     JOINT REPLACEMENT     KNEE ARTHROSCOPY Right    MASS EXCISION Right 08/06/2016   Procedure: EXCISION CYST;  Surgeon: Daryll Brod, MD;  Location: [facility];  Service: Orthopedics;  Laterality: Right;   REVISION TOTAL KNEE ARTHROPLASTY Left    TOTAL KNEE ARTHROPLASTY Bilateral    Social History   Social History Narrative   Not on file   Immunization History  Administered Date(s) Administered   Influenza Split 03/27/2008, 04/26/2009, 04/07/2011   Influenza, High Dose Seasonal PF 03/22/2012, 03/15/2014, 03/19/2015, 03/23/2016, 03/22/2017, 03/16/2018, 03/07/2019, 04/16/2020   Influenza-Unspecified 03/23/2015, 04/05/2017   PFIZER(Purple Top)SARS-COV-2 Vaccination 07/17/2019, 08/07/2019, 04/10/2020   Pfizer Covid-19 Vaccine Bivalent Booster 102yr & up 05/07/2021   Pneumococcal Conjugate-13 08/01/2014   Pneumococcal Polysaccharide-23 03/27/2004, 03/22/2012   Td 09/26/2007   Tdap 12/28/2012   Zoster Recombinat (Shingrix) 02/01/2018   Zoster, Live 02/02/2012, 02/01/2018, 05/13/2018     Objective: Vital Signs: BP (!) 160/83 (BP Location: Right Arm, Patient Position: Sitting, Cuff Size: Large)    Pulse 64   Resp 14   Ht _0  (1.473 m)   Wt 189 lb 6.4 oz (85.9 kg)   BMI 39.58 kg/m    Physical Exam Vitals and nursing note reviewed.  Constitutional:      Appearance: She is well-developed.  HENT:     Head: Normocephalic and atraumatic.  Eyes:     Conjunctiva/sclera: Conjunctivae normal.  Cardiovascular:  Rate and Rhythm: Normal rate and regular rhythm.     Heart sounds: Normal heart sounds.  Pulmonary:     Effort: Pulmonary effort is normal.     Breath sounds: Normal breath sounds.  Abdominal:     General: Bowel sounds are normal.     Palpations: Abdomen is soft.  Musculoskeletal:     Cervical back: Normal range of motion.  Skin:    General: Skin is warm and dry.     Capillary Refill: Capillary refill takes less than 2 seconds.  Neurological:     Mental Status: She is alert and oriented to person, place, and time.  Psychiatric:        Behavior: Behavior normal.      Musculoskeletal Exam: Patient remained seated during the examination today.  C-spine has good ROM.  Thoracic kyphosis noted.  Shoulder joints, elbow joints, and wrist joints have good ROM with no discomfort. [place] joint prominence bilaterally. Severe PIP and DIP thickening consistent with osteoarthritis.  Subluxation of several DIP joints.  Hip joints difficult to assess in seated position. Both knee replacements have good ROM with some discomfort of the left knee. Ankle joints have good ROM.  Pedal edema noted bilaterally.   CDAI Exam: CDAI Score: -- Patient Global: --; Provider Global: -- Swollen: --; Tender: -- Joint Exam 01/27/2022   No joint exam has been documented for this visit   There is currently no information documented on the homunculus. Go to the Rheumatology activity and complete the homunculus joint exam.  Investigation: No additional findings.  Imaging: No results found.  Recent Labs: Lab Results  Component Value Date   WBC 5.9 01/13/2022   HGB 9.7 (L) 01/13/2022   PLT 173  01/13/2022   NA 136 01/13/2022   K 4.8 01/13/2022   CL 99 01/13/2022   CO2 30 01/13/2022   GLUCOSE 182 (H) 01/13/2022   BUN 62 (H) 01/13/2022   CREATININE 2.00 (H) 01/13/2022   BILITOT 0.6 11/12/2020   ALKPHOS 85 11/12/2020   AST 14 (L) 11/12/2020   ALT 9 11/12/2020   PROT 6.3 11/13/2021   ALBUMIN 4.0 01/13/2022   CALCIUM 9.5 01/13/2022   GFRAA 24 (L) 08/05/2020    Speciality Comments: No specialty comments available.  Procedures:  No procedures performed Allergies: Norvasc [amlodipine besylate], Other, Amlodipine besylate, Avapro [irbesartan], Glucophage [metformin hydrochloride], Lisinopril, Losartan, Morphine and related, Simvastatin, and Trulicity [dulaglutide]   CBC and renal function panel 01/13/22.  Assessment / Plan:     Visit Diagnoses: Sjogren's syndrome with other organ involvement ([place]) - ANA 1:320NS, 1:40cytoplasmic, dsDNA+, Ro+, ESR 39. Previously prescribed plaquenil by Dr. Rockwell Alexandria: Discussed the diagnosis of Sjogren's syndrome today in detail and all questions were addressed.  I also discussed the 4-14 fold increased risk for developing lymphoma in patients with Sjogren's syndrome. She is currently under the care of hematology and had a recent abnormal SPEP/IFE.  I also discussed the increased risk for developing ILD.  She is not experiencing any new or worsening pulmonary symptoms.  Her lungs were clear to auscultation on examination today. She has no signs of inflammatory arthritis at this time.  She has chronic pain and stiffness in both hands due to underlying osteoarthritis.  No synovitis was noted on examination. She continues to have ongoing sicca symptoms despite the use of over-the-counter products including Biotene products as well as XyliMelts.  She is also been using restasis eyedrops for symptomatic relief.  She sees her dentist every 6 months and  her ophthalmologist every 3 to 4 months as advised. At her last office visit Dr. Dimas Alexandria discussed  restarting the patient on Plaquenil.  The patient has decided against starting on Plaquenil or any other immune modulating immunosuppressive agents at this time.  She feels that the risks of the medications will outweigh the potential benefits.  She would like to continue her current treatment regimen.  She will notify us if she develops any new or worsening symptoms.  She will follow-up in the office in 3 months and we will repeat autoimmune lab work at that time.  Primary osteoarthritis of both hands:  Severe. She has CMC, PIP, and DIP thickening consistent with osteoarthritis.  Subluxation of several DIP joints.  Discussed the importance of joint protection and muscle strengthening.  No signs of inflammatory arthritis at this time.   Avascular necrosis of bone of hip, left (HCC)  Status post left hip replacement: Intermittent discomfort.  Difficult to assess ROM in seated position.   Status post total bilateral knee replacement: Chronic pain in left knee replacement.  Right knee replacement has good ROM with no discomfort at this time.  Using a cane to assist with ambulation.   Primary osteoarthritis of both feet: She has chronic pain in both feet which she attributes to neuropathy.  She has pedal edema bilaterally.  No joint inflammation noted.   DDD (degenerative disc disease), lumbar: Chronic pain and stiffness.  Using a cane to assist with ambulation.   History of gout: She remains on Uloric 40 mg 3 times a week by her nephrologist. No gout flares.  Age-related osteoporosis without current pathological fracture - Previously treated with Prolia-prescribed by Dr. Cruzita Lederer. She is not currently on any treatment for osteoporosis and has declined further treatment.  No recent falls or fractures.  Using a cane to assist with ambulation.   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 Followed by Dr. Luberta Robertson NP.   Stage  3 chronic kidney disease, unspecified whether stage 3a or 3b CKD ([place])  Primary hypertension  Paroxysmal atrial fibrillation (HCC)  Sinus node dysfunction (HCC)  Pacemaker  Acute diastolic heart failure (HCC)  Controlled type 2 diabetes mellitus with diabetic polyneuropathy, with long-term current use of insulin (HCC)  Polyneuropathy associated with underlying disease (HCC)  GAVE (gastric antral vascular ectasia)  Hiatal hernia  History of gastroesophageal reflux (GERD)  Diverticulosis  Benign esophageal stricture  Vitamin B12 deficiency  Anxiety and depression  History of glaucoma  Orders: No orders of the defined types were placed in this encounter.  No orders of the defined types were placed in this encounter.     Follow-Up Instructions: Return in about 3 months (around 04/29/2022) for Sjogren's syndrome, Osteoarthritis.   Ofilia Neas, PA-C  Note - This record has been created using Dragon software.  Chart creation errors have been sought, but may not always  have been located. Such creation errors do not reflect on  the standard of medical care.

## 2022-01-14 ENCOUNTER — Telehealth: Payer: Self-pay

## 2022-01-14 NOTE — Telephone Encounter (Signed)
-----  Message from Ladell Pier, MD sent at 01/13/2022  8:11 PM EDT ----- Please call patient, iron levels are borderline low, repeat iron/tibc, ferritin next lab

## 2022-01-14 NOTE — Telephone Encounter (Signed)
V/M message left for Pt. with results. Informed Pt if any questions or concerns to return call .

## 2022-01-27 ENCOUNTER — Encounter: Payer: Self-pay | Admitting: Gastroenterology

## 2022-01-27 ENCOUNTER — Ambulatory Visit: Payer: Medicare Other | Attending: Physician Assistant | Admitting: Physician Assistant

## 2022-01-27 ENCOUNTER — Ambulatory Visit (INDEPENDENT_AMBULATORY_CARE_PROVIDER_SITE_OTHER): Payer: Medicare Other

## 2022-01-27 ENCOUNTER — Encounter: Payer: Self-pay | Admitting: Physician Assistant

## 2022-01-27 VITALS — BP 160/83 | HR 64 | Resp 14 | Ht <= 58 in | Wt 189.4 lb

## 2022-01-27 DIAGNOSIS — M19071 Primary osteoarthritis, right ankle and foot: Secondary | ICD-10-CM

## 2022-01-27 DIAGNOSIS — I5031 Acute diastolic (congestive) heart failure: Secondary | ICD-10-CM

## 2022-01-27 DIAGNOSIS — Z950 Presence of cardiac pacemaker: Secondary | ICD-10-CM

## 2022-01-27 DIAGNOSIS — Z8739 Personal history of other diseases of the musculoskeletal system and connective tissue: Secondary | ICD-10-CM

## 2022-01-27 DIAGNOSIS — I495 Sick sinus syndrome: Secondary | ICD-10-CM | POA: Diagnosis not present

## 2022-01-27 DIAGNOSIS — F419 Anxiety disorder, unspecified: Secondary | ICD-10-CM

## 2022-01-27 DIAGNOSIS — I10 Essential (primary) hypertension: Secondary | ICD-10-CM

## 2022-01-27 DIAGNOSIS — Z96642 Presence of left artificial hip joint: Secondary | ICD-10-CM | POA: Diagnosis not present

## 2022-01-27 DIAGNOSIS — F32A Depression, unspecified: Secondary | ICD-10-CM

## 2022-01-27 DIAGNOSIS — G63 Polyneuropathy in diseases classified elsewhere: Secondary | ICD-10-CM

## 2022-01-27 DIAGNOSIS — I480 Paroxysmal atrial fibrillation: Secondary | ICD-10-CM

## 2022-01-27 DIAGNOSIS — E538 Deficiency of other specified B group vitamins: Secondary | ICD-10-CM

## 2022-01-27 DIAGNOSIS — K5790 Diverticulosis of intestine, part unspecified, without perforation or abscess without bleeding: Secondary | ICD-10-CM

## 2022-01-27 DIAGNOSIS — K31819 Angiodysplasia of stomach and duodenum without bleeding: Secondary | ICD-10-CM

## 2022-01-27 DIAGNOSIS — R778 Other specified abnormalities of plasma proteins: Secondary | ICD-10-CM

## 2022-01-27 DIAGNOSIS — M87052 Idiopathic aseptic necrosis of left femur: Secondary | ICD-10-CM | POA: Diagnosis not present

## 2022-01-27 DIAGNOSIS — M3509 Sicca syndrome with other organ involvement: Secondary | ICD-10-CM | POA: Diagnosis not present

## 2022-01-27 DIAGNOSIS — Z8669 Personal history of other diseases of the nervous system and sense organs: Secondary | ICD-10-CM

## 2022-01-27 DIAGNOSIS — M19041 Primary osteoarthritis, right hand: Secondary | ICD-10-CM

## 2022-01-27 DIAGNOSIS — M810 Age-related osteoporosis without current pathological fracture: Secondary | ICD-10-CM

## 2022-01-27 DIAGNOSIS — K222 Esophageal obstruction: Secondary | ICD-10-CM

## 2022-01-27 DIAGNOSIS — Z96653 Presence of artificial knee joint, bilateral: Secondary | ICD-10-CM

## 2022-01-27 DIAGNOSIS — M19072 Primary osteoarthritis, left ankle and foot: Secondary | ICD-10-CM

## 2022-01-27 DIAGNOSIS — M19042 Primary osteoarthritis, left hand: Secondary | ICD-10-CM

## 2022-01-27 DIAGNOSIS — K449 Diaphragmatic hernia without obstruction or gangrene: Secondary | ICD-10-CM

## 2022-01-27 DIAGNOSIS — Z794 Long term (current) use of insulin: Secondary | ICD-10-CM

## 2022-01-27 DIAGNOSIS — Z8719 Personal history of other diseases of the digestive system: Secondary | ICD-10-CM

## 2022-01-27 DIAGNOSIS — E1142 Type 2 diabetes mellitus with diabetic polyneuropathy: Secondary | ICD-10-CM

## 2022-01-27 DIAGNOSIS — M5136 Other intervertebral disc degeneration, lumbar region: Secondary | ICD-10-CM

## 2022-01-27 DIAGNOSIS — N183 Chronic kidney disease, stage 3 unspecified: Secondary | ICD-10-CM

## 2022-01-27 LAB — CUP PACEART REMOTE DEVICE CHECK
Battery Remaining Longevity: 27 mo
Battery Voltage: 2.94 V
Brady Statistic AP VP Percent: 96.67 %
Brady Statistic AP VS Percent: 0.04 %
Brady Statistic AS VP Percent: 3.19 %
Brady Statistic AS VS Percent: 0.09 %
Brady Statistic RA Percent Paced: 96.67 %
Brady Statistic RV Percent Paced: 99.84 %
Date Time Interrogation Session: 20230804172206
Implantable Lead Implant Date: 20161227
Implantable Lead Implant Date: 20161227
Implantable Lead Location: 753859
Implantable Lead Location: 753860
Implantable Lead Model: 5076
Implantable Lead Model: 5076
Implantable Pulse Generator Implant Date: 20161227
Lead Channel Impedance Value: 285 Ohm
Lead Channel Impedance Value: 304 Ohm
Lead Channel Impedance Value: 342 Ohm
Lead Channel Impedance Value: 380 Ohm
Lead Channel Pacing Threshold Amplitude: 0.625 V
Lead Channel Pacing Threshold Amplitude: 1 V
Lead Channel Pacing Threshold Pulse Width: 0.4 ms
Lead Channel Pacing Threshold Pulse Width: 0.4 ms
Lead Channel Sensing Intrinsic Amplitude: 0.875 mV
Lead Channel Sensing Intrinsic Amplitude: 0.875 mV
Lead Channel Sensing Intrinsic Amplitude: 18 mV
Lead Channel Sensing Intrinsic Amplitude: 18 mV
Lead Channel Setting Pacing Amplitude: 2 V
Lead Channel Setting Pacing Amplitude: 2.5 V
Lead Channel Setting Pacing Pulse Width: 0.4 ms
Lead Channel Setting Sensing Sensitivity: 2.8 mV

## 2022-01-28 ENCOUNTER — Telehealth: Payer: Self-pay | Admitting: Cardiovascular Disease

## 2022-01-28 NOTE — Telephone Encounter (Signed)
Pt c/o BP issue: STAT if pt c/o blurred vision, one-sided weakness or slurred speech  1. What are your last 5 BP readings?  174/90  2. Are you having any other symptoms (ex. Dizziness, headache, blurred vision, passed out)? Headache  3. What is your BP issue? Pt states that BP has been running high and has also been feeling disoriented. Pt would like a callback. Pleas advise

## 2022-01-28 NOTE — Telephone Encounter (Signed)
Spoke with pt regarding higher blood pressure reading that was taken today when pt was being seen by her physical therapist. Blood pressure was 174/90 at around 1300 and physical therapist told her that she should let her cardiologist know. Pt states that she took her blood pressure 2 more time since then at 1400- 150/80 and at 1515- 138/60. Pt states that she has noticed over the last couple of weeks that she occasionally has a headache, feels like her head is full and is a bit "swimmy" headed. Pt states that she thought this could be caused by allergies and didn't think her blood pressure might be causing these symptoms. Pt's blood pressure at rheumatology appointment was 160/83. Pt does feel like she is hydrated but does say that she doesn't feel like she is eating very well. Pt states that she is aware of salt restriction and is compliant. Pt is taking all of her medication as directed and has not had any recent changes. Recommended that pt keep a blood pressure log for the next couple of days. Instructed pt to take her blood pressure at least one hour after her medication and after resting for about 5 minutes. Pt asks when she is due to be seen back, per Dr. Kathalene Frames note 07/29/21 would like to see pt back in 6 months. Appointment made for pt. Pt instructed that if her pressure consistently runs over 150/80 to call us on Monday to report these readings, if pressures do not trend this high pt will bring blood pressure log to office visit for review. Pt verbalizes understanding.

## 2022-02-05 NOTE — Progress Notes (Signed)
Cardiology Office Note:   Date:  02/06/2022  NAME:  Tricia Gonzales    MRN: [id number] DOB:  1940-10-20   PCP:  Carol Ada, MD  Cardiologist:  Evalina Field, MD  Electrophysiologist:  Cristopher Peru, MD   Referring MD: Carol Ada, MD   Chief Complaint  Patient presents with   Follow-up        History of Present Illness:   Tricia Gonzales is a 81 y.o. female with a hx of paroxysmal A-fib, HFpEF, diabetes, hypertension, CKD who presents for follow-up.  She reports blood pressure values have been increased at home.  Today 138/78.  She reports no chest pain.  She does get short of breath with activity.  She is working with physical therapy.  We discussed increasing her carvedilol to 6.25 mg twice daily.  She is on a very small dose.  She is still having lower extremity edema.  Her nephrologist is managing this given the fact that she has CKD stage IV.  She overall is maintaining sinus rhythm.  Everything seems to be quite stable.  Her weights are stable.  Problem List 1. Paroxysmal Afib -CHADSVASC=5 -DCCV 07/31/2020 2. HFpEF 3. Junctional bradycardia s/p ppm 2016 4. DM -A1c 7.2 5. HTN 6. 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 HLD -T chol 123, HDL 42, LDL 61, TG 108 8. 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 Sjogren's   Past Medical History: 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   Anxiety    Arthritis    Atrial fibrillation (HCC)    Avascular necrosis (HCC) hip left   leg pain also   Chronic back pain    Chronic kidney disease (CKD), stage III (moderate) (HCC)    Depression    Diverticulosis    Early cataracts, bilateral    Esophageal stricture    Gastritis    GAVE (gastric antral vascular ectasia)    GERD (gastroesophageal reflux disease)    Glaucoma    Heart murmur     " some doctors say that I have one some say that I don"t "   Hiatal hernia    History of blood transfusion    "@ least w/1st knee OR"   History of gout     History of kidney stones    Hyperlipidemia    Hypertension    Intestinal obstruction ([place])    Kidney stones    Obesity    Osteoarthritis    Osteoporosis    Pericarditis 2016   Presence of permanent cardiac pacemaker    Sjogren's disease ([place])    Thyroid disease    Type II diabetes mellitus ([place])    Vitamin B12 deficiency     Past Surgical History: Past Surgical History:  Procedure Laterality Date   ABDOMINAL HYSTERECTOMY     complete   CARDIOVERSION N/A 07/31/2020   Procedure: CARDIOVERSION;  Surgeon: Werner Lean, MD;  Location: [facility];  Service: Cardiovascular;  Laterality: N/A;   CHOLECYSTECTOMY OPEN     COLECTOMY     for rectovaginal fistula   COLONOSCOPY  01/07/2012   Procedure: COLONOSCOPY;  Surgeon: Lafayette Dragon, MD;  Location: [facility];  Service: Endoscopy;  Laterality: N/A;   EP IMPLANTABLE DEVICE N/A 06/18/2015   Procedure: Pacemaker Implant;  Surgeon: Evans Lance, MD;  Location: [place] [facility];  Service: Cardiovascular;  Laterality: N/A;   ESOPHAGEAL DILATION  08/17/2020   Procedure: ESOPHAGEAL DILATION;  Surgeon: Lavena Bullion, DO;  Location: [facility] ENDOSCOPY;  Service: Gastroenterology;;   ESOPHAGOGASTRODUODENOSCOPY N/A 11/24/2018   Procedure: ESOPHAGOGASTRODUODENOSCOPY (EGD);  Surgeon: Yetta Flock, MD;  Location: [facility];  Service: Gastroenterology;  Laterality: N/A;   ESOPHAGOGASTRODUODENOSCOPY (EGD) WITH ESOPHAGEAL DILATION     ESOPHAGOGASTRODUODENOSCOPY (EGD) WITH PROPOFOL N/A 08/12/2015   Procedure: ESOPHAGOGASTRODUODENOSCOPY (EGD) WITH PROPOFOL;  Surgeon: Manus Gunning, MD;  Location: [place];  Service: Gastroenterology;  Laterality: N/A;   ESOPHAGOGASTRODUODENOSCOPY (EGD) WITH PROPOFOL N/A 08/17/2020   Procedure: ESOPHAGOGASTRODUODENOSCOPY (EGD) WITH PROPOFOL;  Surgeon: Lavena Bullion, DO;  Location: [place];  Service: Gastroenterology;  Laterality: N/A;   EYE SURGERY Bilateral    cataracts   HOT  HEMOSTASIS N/A 11/24/2018   Procedure: HOT HEMOSTASIS (ARGON PLASMA COAGULATION/BICAP);  Surgeon: Yetta Flock, MD;  Location: [facility];  Service: Gastroenterology;  Laterality: N/A;   HOT HEMOSTASIS N/A 08/17/2020   Procedure: HOT HEMOSTASIS (ARGON PLASMA COAGULATION/BICAP);  Surgeon: Lavena Bullion, DO;  Location: [facility] ENDOSCOPY;  Service: Gastroenterology;  Laterality: N/A;   I & D EXTREMITY Right 08/06/2016   Procedure: DEBRIDEMENT PIP RIGHT RING FINGER;  Surgeon: Daryll Brod, MD;  Location: [place];  Service: Orthopedics;  Laterality: Right;   INSERT / REPLACE / REMOVE PACEMAKER     JOINT REPLACEMENT     KNEE ARTHROSCOPY Right    MASS EXCISION Right 08/06/2016   Procedure: EXCISION CYST;  Surgeon: Daryll Brod, MD;  Location: [place];  Service: Orthopedics;  Laterality: Right;   REVISION TOTAL KNEE ARTHROPLASTY Left    TOTAL KNEE ARTHROPLASTY Bilateral     Current Medications: Current Meds  Medication Sig   ACCU-CHEK AVIVA PLUS test strip 1 each by Other route daily.    acetaminophen (TYLENOL) 650 MG CR tablet Take 1,300 mg by mouth every evening.   apixaban (ELIQUIS) 2.5 MG TABS tablet Take 1 tablet (2.5 mg total) by mouth 2 (two) times daily.   BD PEN NEEDLE NANO U/F 32G X 4 MM MISC See admin instructions.   Cholecalciferol (VITAMIN D3) 25 MCG (1000 UT) CAPS Take 1,000 Units by mouth daily.   cycloSPORINE (RESTASIS) 0.05 % ophthalmic emulsion Place 1 drop into both eyes 2 (two) times daily.   dicyclomine (BENTYL) 10 MG capsule Take 1 capsule (10 mg total) by mouth every 8 (eight) hours as needed for spasms.   dorzolamide (TRUSOPT) 2 % ophthalmic solution Place 1 drop into the right eye 2 (two) times daily.   febuxostat (ULORIC) 40 MG tablet Take 1 tablet (40 mg total) by mouth every Monday, Wednesday, and Friday.   ferrous sulfate 325 (65 FE) MG EC tablet Take 1 tablet (325 mg total) by mouth 2 (two) times daily. (Patient taking differently: Take 325 mg by mouth daily with  breakfast.)   gabapentin (NEURONTIN) 400 MG capsule Take 1 capsule (400 mg total) by mouth at bedtime.   levothyroxine (SYNTHROID) 75 MCG tablet Take 75 mcg by mouth every morning.   lidocaine (LIDODERM) 5 % Place 1-3 patches onto the skin See admin instructions. Apply 1-3 patches transdermally every 24 hours as needed for pain and remove & discard patches within 12 hours or as directed by MD   montelukast (SINGULAIR) 10 MG tablet Take 1 tablet (10 mg total) by mouth at bedtime.   nystatin (NYAMYC) powder APPLY TOPICALLY TO LEFT ABDOMINAL FOLD AND GROIN TWICE A DAY   ondansetron (ZOFRAN-ODT) 4 MG disintegrating tablet Take 1 tablet (4 mg total) by mouth every 8 (eight) hours  as needed for nausea or vomiting.   pantoprazole (PROTONIX) 40 MG tablet Take 40 mg by mouth daily.   pentoxifylline (TRENTAL) 400 MG CR tablet TAKE ONE TABLET EACH DAY WITH A MEAL   Polyethylene Glycol 3350 (MIRALAX PO) Take 17 g by mouth in the morning and at bedtime.   rOPINIRole (REQUIP) 0.5 MG tablet Take 0.5 mg by mouth at bedtime.   rosuvastatin (CRESTOR) 5 MG tablet Take 1 tablet (5 mg total) by mouth daily. (Patient taking differently: Take 5 mg by mouth at bedtime.)   sennosides-docusate sodium (SENOKOT-S) 8.6-50 MG tablet 1 tablet   SF 5000 PLUS 1.1 % CREA dental cream Take by mouth as directed.   torsemide (DEMADEX) 20 MG tablet Take 1 tablet (20 mg total) by mouth daily.   traMADol (ULTRAM) 50 MG tablet Take 50 mg by mouth See admin instructions. Take 50 mg by mouth at bedtime and an additional 50 mg once a day as needed for pain   TRESIBA FLEXTOUCH 100 UNIT/ML FlexTouch Pen Inject 10 Units into the skin every evening.   [DISCONTINUED] carvedilol (COREG) 3.125 MG tablet TAKE TWO TABLETS IN THE MORNING AND TAKEONE TABLET IN THE EVENING     Allergies:    Norvasc [amlodipine besylate], Other, Amlodipine besylate, Avapro [irbesartan], Glucophage [metformin hydrochloride], Lisinopril, Losartan, Morphine and related,  Simvastatin, and Trulicity [dulaglutide]   Social History: Social History   Socioeconomic History   Marital status: Divorced    Spouse name: Not on file   Number of children: 2   Years of education: Not on file   Highest education level: Not on file  Occupational History   Occupation: retired  Tobacco Use   Smoking status: Former    Packs/day: 2.00    Years: 3.00    Total pack years: 6.00    Types: Cigarettes    Quit date: 06/23/1983    Years since quitting: 38.6    Passive exposure: Never   Smokeless tobacco: Never  Vaping Use   Vaping Use: Never used  Substance and Sexual Activity   Alcohol use: Yes    Alcohol/week: 2.0 standard drinks of alcohol    Types: 2 Glasses of wine per week    Comment: occ   Drug use: No   Sexual activity: Not Currently  Other Topics Concern   Not on file  Social History Narrative   Not on file   Social Determinants of Health   Financial Resource Strain: Not on file  Food Insecurity: Not on file  Transportation Needs: Not on file  Physical Activity: Not on file  Stress: Not on file  Social Connections: Not on file     Family History: The patient's family history includes Diabetes in her brother and mother; Hypertension in her sister. There is no history of Colon cancer, Heart attack, Stroke, Colon polyps, Esophageal cancer, Rectal cancer, or Stomach cancer.  ROS:   All other ROS reviewed and negative. Pertinent positives noted in the HPI.     EKGs/Labs/Other Studies Reviewed:   The following studies were personally reviewed by me today:  TTE 08/16/2020  1. Left ventricular ejection fraction, by estimation, is 50 to 55%. The  left ventricle has low normal function. The left ventricle has no regional  wall motion abnormalities.   2. Right ventricular systolic function is normal. The right ventricular  size is normal. There is moderately elevated pulmonary artery systolic  pressure.   3. The mitral valve is normal in structure.  Moderate to severe  mitral  valve regurgitation. No evidence of mitral stenosis. Moderate mitral  annular calcification.   4. The aortic valve is normal in structure. Aortic valve regurgitation is  not visualized. No aortic stenosis is present.   5. The inferior vena cava is dilated in size with <50% respiratory  variability, suggesting right atrial pressure of 15 mmHg.  Recent Labs: 01/13/2022: BUN 62; Creatinine, Ser 2.00; Hemoglobin 9.7; Platelet Count 173; Potassium 4.8; Sodium 136   Recent Lipid Panel No results found for: "CHOL", "TRIG", "HDL", "CHOLHDL", "VLDL", "LDLCALC", "LDLDIRECT"  Physical Exam:   VS:  BP 138/78   Pulse 81   Ht _0  (1.473 m)   Wt 191 lb 3.2 oz (86.7 kg)   SpO2 93%   BMI 39.96 kg/m    Wt Readings from Last 3 Encounters:  02/06/22 191 lb 3.2 oz (86.7 kg)  01/27/22 189 lb 6.4 oz (85.9 kg)  01/13/22 190 lb 9.6 oz (86.5 kg)    General: Well nourished, well developed, in no acute distress Head: Atraumatic, normal size  Eyes: PEERLA, EOMI  Neck: Supple, no JVD Endocrine: No thryomegaly Cardiac: Normal S1, S2; RRR; no murmurs, rubs, or gallops Lungs: Clear to auscultation bilaterally, no wheezing, rhonchi or rales  Abd: Soft, nontender, no hepatomegaly  Ext: 1+ pitting edema  Musculoskeletal: No deformities, BUE and BLE strength normal and equal Skin: Warm and dry, no rashes   Neuro: Alert and oriented to person, place, time, and situation, CNII-XII grossly intact, no focal deficits  Psych: Normal mood and affect   ASSESSMENT:   Tricia Gonzales is a 81 y.o. female who presents for the following: 1. Persistent atrial fibrillation ([place])   2. Acquired thrombophilia ([place])   3. Complete heart block ([place])   4. Chronic diastolic heart failure ([place])   5. Primary hypertension     PLAN:   1. Persistent atrial fibrillation ([place]) 2. Acquired thrombophilia ([place]) -maintaining NSR. On eliquis 2.5 mg BID.   3. Complete heart block (HCC) -s/p ppm. No issues.    4. Chronic diastolic heart failure ([place]) -CKD IV. Managed by nephrology. Continue torsemide as prescribed.   5. Primary hypertension -increase coreg to 6.25 mg BID.   Disposition: Return in about 6 months (around 08/09/2022).  Medication Adjustments/Labs and Tests Ordered: Current medicines are reviewed at length with the patient today.  Concerns regarding medicines are outlined above.  No orders of the defined types were placed in this encounter.  Meds ordered this encounter  Medications   carvedilol (COREG) 6.25 MG tablet    Sig: Take 1 tablet by mouth twice daily    Dispense:  90 tablet    Refill:  3    Patient Instructions  Medication Instructions:  Increase CARVEDILOL 6.25 twice daily   *If you need a refill on your cardiac medications before your next appointment, please call your pharmacy*   Follow-Up: At [facility], you and your health needs are our priority.  As part of our continuing mission to provide you with exceptional heart care, we have created designated Provider Care Teams.  These Care Teams include your primary Cardiologist (physician) and Advanced Practice Providers (APPs -  Physician Assistants and Nurse Practitioners) who all work together to provide you with the care you need, when you need it.  We recommend signing up for the patient portal called "MyChart".  Sign up information is provided on this After Visit Summary.  MyChart is used to connect with patients for Virtual Visits (Telemedicine).  Patients  are able to view lab/test results, encounter notes, upcoming appointments, etc.  Non-urgent messages can be sent to your provider as well.   To learn more about what you can do with MyChart, go to [email or web address].    Your next appointment:   6 month(s)  The format for your next appointment:   In Person  Provider:   Evalina Field, MD             Time Spent with Patient: I have spent a total of 25 minutes with patient  reviewing hospital notes, telemetry, EKGs, labs and examining the patient as well as establishing an assessment and plan that was discussed with the patient.  > 50% of time was spent in direct patient care.  Signed, Addison Naegeli. Audie Box, MD, [place]  [address] [place], [place] [address] [phone]  02/06/2022 3:56 PM

## 2022-02-06 ENCOUNTER — Encounter: Payer: Self-pay | Admitting: Cardiovascular Disease

## 2022-02-06 ENCOUNTER — Ambulatory Visit (INDEPENDENT_AMBULATORY_CARE_PROVIDER_SITE_OTHER): Payer: Medicare Other | Admitting: Cardiovascular Disease

## 2022-02-06 VITALS — BP 138/78 | HR 81 | Ht <= 58 in | Wt 191.2 lb

## 2022-02-06 DIAGNOSIS — I442 Atrioventricular block, complete: Secondary | ICD-10-CM | POA: Diagnosis not present

## 2022-02-06 DIAGNOSIS — I5032 Chronic diastolic (congestive) heart failure: Secondary | ICD-10-CM

## 2022-02-06 DIAGNOSIS — I4819 Other persistent atrial fibrillation: Secondary | ICD-10-CM

## 2022-02-06 DIAGNOSIS — D6869 Other thrombophilia: Secondary | ICD-10-CM

## 2022-02-06 DIAGNOSIS — I10 Essential (primary) hypertension: Secondary | ICD-10-CM

## 2022-02-06 MED ORDER — CARVEDILOL 6.25 MG PO TABS: ORAL_TABLET | ORAL | 3 refills | Status: DC

## 2022-02-06 NOTE — Patient Instructions (Signed)
Medication Instructions:  Increase CARVEDILOL 6.25 twice daily   *If you need a refill on your cardiac medications before your next appointment, please call your pharmacy*   Follow-Up: At [facility], you and your health needs are our priority.  As part of our continuing mission to provide you with exceptional heart care, we have created designated Provider Care Teams.  These Care Teams include your primary Cardiologist (physician) and Advanced Practice Providers (APPs -  Physician Assistants and Nurse Practitioners) who all work together to provide you with the care you need, when you need it.  We recommend signing up for the patient portal called "MyChart".  Sign up information is provided on this After Visit Summary.  MyChart is used to connect with patients for Virtual Visits (Telemedicine).  Patients are able to view lab/test results, encounter notes, upcoming appointments, etc.  Non-urgent messages can be sent to your provider as well.   To learn more about what you can do with MyChart, go to [email or web address].    Your next appointment:   6 month(s)  The format for your next appointment:   In Person  Provider:   Evalina Field, MD

## 2022-02-09 ENCOUNTER — Other Ambulatory Visit: Payer: Self-pay | Admitting: Cardiovascular Disease

## 2022-02-09 NOTE — Telephone Encounter (Signed)
Prescription refill request for Eliquis received. Indication: AF Last office visit: 02/06/22  Tricia Mulberry MD Scr: 2.0 on 01/13/22 Age: 81 Weight: 86.7kg  Based on above findings Eliquis 2.58m twice daily is the appropriate dose.  Refill approved.

## 2022-02-20 ENCOUNTER — Telehealth: Payer: Self-pay | Admitting: Internal Medicine

## 2022-02-20 DIAGNOSIS — I4819 Other persistent atrial fibrillation: Secondary | ICD-10-CM

## 2022-02-20 DIAGNOSIS — I5032 Chronic diastolic (congestive) heart failure: Secondary | ICD-10-CM

## 2022-02-20 DIAGNOSIS — Z79899 Other long term (current) drug therapy: Secondary | ICD-10-CM

## 2022-02-20 NOTE — Telephone Encounter (Signed)
  1. Has your device fired? no  2. Is you device beeping? no  3. Are you experiencing draining or swelling at device site? no  4. Are you calling to see if we received your device transmission? Yes, sent 8/4  5. Have you passed out? no  Patient states she was supposed to send a transmission 7/24, but it was not working. She says she did send one in 8/4, but has received a letter about missing a transmission. She would like to verify if the transmission sent 8/4 was received.   Please route to [place]

## 2022-02-20 NOTE — Telephone Encounter (Signed)
Spoke with patient and confirmed that we did get her transmission. Patient verbalized understanding

## 2022-02-24 ENCOUNTER — Inpatient Hospital Stay: Payer: Medicare Other

## 2022-02-24 ENCOUNTER — Inpatient Hospital Stay: Payer: Medicare Other | Attending: Oncology

## 2022-02-24 VITALS — BP 130/94 | HR 68 | Temp 97.9°F | Resp 20 | Ht <= 58 in | Wt 195.5 lb

## 2022-02-24 DIAGNOSIS — D631 Anemia in chronic kidney disease: Secondary | ICD-10-CM

## 2022-02-24 DIAGNOSIS — N183 Chronic kidney disease, stage 3 unspecified: Secondary | ICD-10-CM

## 2022-02-24 LAB — CBC WITH DIFFERENTIAL (CANCER CENTER ONLY)
Abs Immature Granulocytes: 0.03 10*3/uL (ref 0.00–0.07)
Basophils Absolute: 0 10*3/uL (ref 0.0–0.1)
Basophils Relative: 0 %
Eosinophils Absolute: 0 10*3/uL (ref 0.0–0.5)
Eosinophils Relative: 1 %
HCT: 34 % — ABNORMAL LOW (ref 36.0–46.0)
Hemoglobin: 10.6 g/dL — ABNORMAL LOW (ref 12.0–15.0)
Immature Granulocytes: 1 %
Lymphocytes Relative: 8 %
Lymphs Abs: 0.5 10*3/uL — ABNORMAL LOW (ref 0.7–4.0)
MCH: 31.5 pg (ref 26.0–34.0)
MCHC: 31.2 g/dL (ref 30.0–36.0)
MCV: 100.9 fL — ABNORMAL HIGH (ref 80.0–100.0)
Monocytes Absolute: 0.4 10*3/uL (ref 0.1–1.0)
Monocytes Relative: 7 %
Neutro Abs: 5.2 10*3/uL (ref 1.7–7.7)
Neutrophils Relative %: 83 %
Platelet Count: 193 10*3/uL (ref 150–400)
RBC: 3.37 MIL/uL — ABNORMAL LOW (ref 3.87–5.11)
RDW: 15.4 % (ref 11.5–15.5)
WBC Count: 6.2 10*3/uL (ref 4.0–10.5)
nRBC: 0 % (ref 0.0–0.2)

## 2022-02-24 MED ORDER — EPOETIN ALFA-EPBX 40000 UNIT/ML IJ SOLN
40000.0000 [IU] | Freq: Once | INTRAMUSCULAR | Status: AC
  Administered 2022-02-24: 40000 [IU] via SUBCUTANEOUS
  Filled 2022-02-24: qty 1

## 2022-02-24 MED ORDER — EPOETIN ALFA-EPBX 20000 UNIT/ML IJ SOLN
20000.0000 [IU] | Freq: Once | INTRAMUSCULAR | Status: AC
  Administered 2022-02-24: 20000 [IU] via SUBCUTANEOUS
  Filled 2022-02-24: qty 1

## 2022-02-24 NOTE — Patient Instructions (Signed)

## 2022-03-03 NOTE — Progress Notes (Signed)
Remote pacemaker transmission.   

## 2022-03-11 ENCOUNTER — Ambulatory Visit: Payer: Medicare Other | Admitting: Rheumatology

## 2022-03-17 ENCOUNTER — Encounter: Payer: Self-pay | Admitting: Physician Assistant

## 2022-03-17 ENCOUNTER — Ambulatory Visit: Payer: Medicare Other | Attending: Physician Assistant | Admitting: Physician Assistant

## 2022-03-17 VITALS — BP 138/78 | HR 68 | Ht <= 58 in | Wt 195.8 lb

## 2022-03-17 DIAGNOSIS — E785 Hyperlipidemia, unspecified: Secondary | ICD-10-CM | POA: Diagnosis not present

## 2022-03-17 DIAGNOSIS — E119 Type 2 diabetes mellitus without complications: Secondary | ICD-10-CM

## 2022-03-17 DIAGNOSIS — R0609 Other forms of dyspnea: Secondary | ICD-10-CM

## 2022-03-17 DIAGNOSIS — I10 Essential (primary) hypertension: Secondary | ICD-10-CM

## 2022-03-17 DIAGNOSIS — N184 Chronic kidney disease, stage 4 (severe): Secondary | ICD-10-CM

## 2022-03-17 DIAGNOSIS — I480 Paroxysmal atrial fibrillation: Secondary | ICD-10-CM

## 2022-03-17 DIAGNOSIS — Z950 Presence of cardiac pacemaker: Secondary | ICD-10-CM

## 2022-03-17 NOTE — Progress Notes (Unsigned)
Cardiology Office Note:    Date:  03/19/2022   ID:  Tricia Gonzales, DOB 1941-04-01, MRN [id number]  PCP:  Carol Ada, [place] Providers Cardiologist:  Evalina Field, MD Electrophysiologist:  Cristopher Peru, MD     Referring MD: Carol Ada, MD   Chief Complaint  Patient presents with   Follow-up    Seen for Dr. Audie Box    History of Present Illness:    Tricia Gonzales is a 81 y.o. female with a hx of PAF, HFpEF, hypertension, hyperlipidemia, DM2, and CKD stage IV.  She has been followed by nephrology service.  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  Patient was previously diagnosed with PAF and underwent DCCV on 07/31/2020.  Limited echocardiogram obtained at the time showed EF 50 to 55%, moderately elevated pulmonary artery systolic pressure, moderate to severe MR.  She has a history of GI bleed and Sjogren's.  Patient was last seen by Dr. Audie Box on 02/06/2022 at which time she was maintaining sinus rhythm and has been compliant with Eliquis 2.5 mg twice a day.  Due to elevated blood pressure, carvedilol was increased to 6.25 mg twice a day.  49-monthfollow-up was recommended.  Patient presents today for evaluation of dyspnea on exertion that has been going on for the past several month.  She states her symptom has been going on since her cardiac medication has been adjusted including increasing carvedilol and lowered the Eliquis.  She believe her Eliquis should have been maintained at 5 mg twice a day, I informed the patient that she is on correct lower dosing of Eliquis based on her renal function and her age.  She says she never had shortness of breath before with A-fib last year.  However she does notice her heart rate has been higher than normal and she suspect she has went back into atrial fibrillation.  Unfortunately EKG showed paced rhythm, therefore I do not know what is the underlying rhythm, I asked her to send  me a remote transmission tonight to our device clinic so we can evaluate if the patient has been in A-fib recently.  On physical exam, she does have 1+ lower extremity pitting edema, I asked her to increase the torsemide to 20 mg twice a day for 3 days before going down to the previous dose however she says she has tried to increase it was my recently without improvement.  She also mentions her nephrologist does not want to increase her diuretic.  I am concerned that her mitral regurgitation may have worsened and recommended echocardiogram.  She is agreeable to this.  Her recent hemoglobin was stable.  I recommended a basic metabolic panel and a BNP as well.  I plan to see the patient back in 4 weeks on a day Dr. OAudie Boxwas also in the office.  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   Anxiety    Arthritis    Atrial fibrillation ([place]    Avascular necrosis (HCC) hip left   leg pain also   Chronic back pain    Chronic kidney disease (CKD), stage III (moderate) (HCC)    Depression    Diverticulosis    Early cataracts, bilateral    Esophageal stricture    Gastritis    GAVE (gastric antral vascular ectasia)    GERD (gastroesophageal reflux disease)    Glaucoma  Heart murmur     " some doctors say that I have one some say that I don"t "   Hiatal hernia    History of blood transfusion    "@ least w/1st knee OR"   History of gout    History of kidney stones    Hyperlipidemia    Hypertension    Intestinal obstruction ([place])    Kidney stones    Obesity    Osteoarthritis    Osteoporosis    Pericarditis 2016   Presence of permanent cardiac pacemaker    Sjogren's disease ([place])    Thyroid disease    Type II diabetes mellitus ([place])    Vitamin B12 deficiency     Past Surgical History:  Procedure Laterality Date   ABDOMINAL HYSTERECTOMY     complete   CARDIOVERSION N/A 07/31/2020   Procedure: CARDIOVERSION;  Surgeon: Werner Lean, MD;  Location: [facility];  Service: Cardiovascular;  Laterality: N/A;   CHOLECYSTECTOMY OPEN     COLECTOMY     for rectovaginal fistula   COLONOSCOPY  01/07/2012   Procedure: COLONOSCOPY;  Surgeon: Lafayette Dragon, MD;  Location: [facility];  Service: Endoscopy;  Laterality: N/A;   EP IMPLANTABLE DEVICE N/A 06/18/2015   Procedure: Pacemaker Implant;  Surgeon: Evans Lance, MD;  Location: [place];  Service: Cardiovascular;  Laterality: N/A;   ESOPHAGEAL DILATION  08/17/2020   Procedure: ESOPHAGEAL DILATION;  Surgeon: Lavena Bullion, DO;  Location: [facility];  Service: Gastroenterology;;   ESOPHAGOGASTRODUODENOSCOPY N/A 11/24/2018   Procedure: ESOPHAGOGASTRODUODENOSCOPY (EGD);  Surgeon: Yetta Flock, MD;  Location: [facility];  Service: Gastroenterology;  Laterality: N/A;   ESOPHAGOGASTRODUODENOSCOPY (EGD) WITH ESOPHAGEAL DILATION     ESOPHAGOGASTRODUODENOSCOPY (EGD) WITH PROPOFOL N/A 08/12/2015   Procedure: ESOPHAGOGASTRODUODENOSCOPY (EGD) WITH PROPOFOL;  Surgeon: Manus Gunning, MD;  Location: [facility];  Service: Gastroenterology;  Laterality: N/A;   ESOPHAGOGASTRODUODENOSCOPY (EGD) WITH PROPOFOL N/A 08/17/2020   Procedure: ESOPHAGOGASTRODUODENOSCOPY (EGD) WITH PROPOFOL;  Surgeon: Lavena Bullion, DO;  Location: [facility];  Service: Gastroenterology;  Laterality: N/A;   EYE SURGERY Bilateral    cataracts   HOT HEMOSTASIS N/A 11/24/2018   Procedure: HOT HEMOSTASIS (ARGON PLASMA COAGULATION/BICAP);  Surgeon: Yetta Flock, MD;  Location: [facility];  Service: Gastroenterology;  Laterality: N/A;   HOT HEMOSTASIS N/A 08/17/2020   Procedure: HOT HEMOSTASIS (ARGON PLASMA COAGULATION/BICAP);  Surgeon: Lavena Bullion, DO;  Location: [facility];  Service: Gastroenterology;  Laterality: N/A;   I & D EXTREMITY Right 08/06/2016   Procedure: DEBRIDEMENT PIP RIGHT RING FINGER;  Surgeon: Daryll Brod, MD;  Location: [place];  Service:  Orthopedics;  Laterality: Right;   INSERT / REPLACE / REMOVE PACEMAKER     JOINT REPLACEMENT     KNEE ARTHROSCOPY Right    MASS EXCISION Right 08/06/2016   Procedure: EXCISION CYST;  Surgeon: Daryll Brod, MD;  Location: [facility];  Service: Orthopedics;  Laterality: Right;   REVISION TOTAL KNEE ARTHROPLASTY Left    TOTAL KNEE ARTHROPLASTY Bilateral     Current Medications: Current Meds  Medication Sig   ACCU-CHEK AVIVA PLUS test strip 1 each by Other route daily.    acetaminophen (TYLENOL) 650 MG CR tablet Take 1,300 mg by mouth every evening.   BD PEN NEEDLE NANO U/F 32G X 4 MM MISC See admin instructions.   brimonidine (ALPHAGAN) 0.2 % ophthalmic solution Place 1 drop into the right eye 2 (two) times daily.   carvedilol (COREG) 6.25 MG  tablet Take 1 tablet by mouth twice daily   Cholecalciferol (VITAMIN D3) 25 MCG (1000 UT) CAPS Take 1,000 Units by mouth daily.   cycloSPORINE (RESTASIS) 0.05 % ophthalmic emulsion Place 1 drop into both eyes 2 (two) times daily.   dicyclomine (BENTYL) 10 MG capsule Take 1 capsule (10 mg total) by mouth every 8 (eight) hours as needed for spasms.   dorzolamide (TRUSOPT) 2 % ophthalmic solution Place 1 drop into the right eye 2 (two) times daily.   ELIQUIS 2.5 MG TABS tablet TAKE ONE TABLET TWICE DAILY   febuxostat (ULORIC) 40 MG tablet Take 1 tablet (40 mg total) by mouth every Monday, Wednesday, and Friday.   ferrous sulfate 325 (65 FE) MG EC tablet Take 1 tablet (325 mg total) by mouth 2 (two) times daily.   gabapentin (NEURONTIN) 400 MG capsule Take 1 capsule (400 mg total) by mouth at bedtime.   levothyroxine (SYNTHROID) 75 MCG tablet Take 75 mcg by mouth every morning.   lidocaine (LIDODERM) 5 % Place 1-3 patches onto the skin See admin instructions. Apply 1-3 patches transdermally every 24 hours as needed for pain and remove & discard patches within 12 hours or as directed by MD   montelukast (SINGULAIR) 10 MG tablet Take 1 tablet (10 mg total) by mouth  at bedtime.   nystatin (NYAMYC) powder APPLY TOPICALLY TO LEFT ABDOMINAL FOLD AND GROIN TWICE A DAY   ondansetron (ZOFRAN-ODT) 4 MG disintegrating tablet Take 1 tablet (4 mg total) by mouth every 8 (eight) hours as needed for nausea or vomiting.   pantoprazole (PROTONIX) 40 MG tablet Take 40 mg by mouth daily.   pentoxifylline (TRENTAL) 400 MG CR tablet TAKE ONE TABLET EACH DAY WITH A MEAL   Polyethylene Glycol 3350 (MIRALAX PO) Take 17 g by mouth in the morning and at bedtime.   rOPINIRole (REQUIP) 0.5 MG tablet Take 0.5 mg by mouth at bedtime.   rosuvastatin (CRESTOR) 5 MG tablet Take 1 tablet (5 mg total) by mouth daily.   sennosides-docusate sodium (SENOKOT-S) 8.6-50 MG tablet 1 tablet   SF 5000 PLUS 1.1 % CREA dental cream Take by mouth as directed.   torsemide (DEMADEX) 20 MG tablet Take 1 tablet (20 mg total) by mouth daily.   traMADol (ULTRAM) 50 MG tablet Take 50 mg by mouth See admin instructions. Take 50 mg by mouth at bedtime and an additional 50 mg once a day as needed for pain   Travoprost, BAK Free, (TRAVATAN) 0.004 % SOLN ophthalmic solution SMARTSIG:1 Drop(s) Right Eye Every Evening   TRESIBA FLEXTOUCH 100 UNIT/ML FlexTouch Pen Inject 10 Units into the skin every evening.     Allergies:   Norvasc [amlodipine besylate], Other, Amlodipine besylate, Avapro [irbesartan], Glucophage [metformin hydrochloride], Lisinopril, Losartan, Morphine and related, Simvastatin, and Trulicity [dulaglutide]   Social History   Socioeconomic History   Marital status: Divorced    Spouse name: Not on file   Number of children: 2   Years of education: Not on file   Highest education level: Not on file  Occupational History   Occupation: retired  Tobacco Use   Smoking status: Former    Packs/day: 2.00    Years: 3.00    Total pack years: 6.00    Types: Cigarettes    Quit date: 06/23/1983    Years since quitting: 38.7    Passive exposure: Never   Smokeless tobacco: Never  Vaping Use    Vaping Use: Never used  Substance and Sexual Activity  Alcohol use: Yes    Alcohol/week: 2.0 standard drinks of alcohol    Types: 2 Glasses of wine per week    Comment: occ   Drug use: No   Sexual activity: Not Currently  Other Topics Concern   Not on file  Social History Narrative   Not on file   Social Determinants of Health   Financial Resource Strain: Not on file  Food Insecurity: Not on file  Transportation Needs: Not on file  Physical Activity: Not on file  Stress: Not on file  Social Connections: Not on file     Family History: The patient's family history includes Diabetes in her brother and mother; Hypertension in her sister. There is no history of Colon cancer, Heart attack, Stroke, Colon polyps, Esophageal cancer, Rectal cancer, or Stomach cancer.  ROS:   Please see the history of present illness.     All other systems reviewed and are negative.  EKGs/Labs/Other Studies Reviewed:    The following studies were reviewed today:  Limited echo 08/16/2020  1. Left ventricular ejection fraction, by estimation, is 50 to 55%. The  left ventricle has low normal function. The left ventricle has no regional  wall motion abnormalities.   2. Right ventricular systolic function is normal. The right ventricular  size is normal. There is moderately elevated pulmonary artery systolic  pressure.   3. The mitral valve is normal in structure. Moderate to severe mitral  valve regurgitation. No evidence of mitral stenosis. Moderate mitral  annular calcification.   4. The aortic valve is normal in structure. Aortic valve regurgitation is  not visualized. No aortic stenosis is present.   5. The inferior vena cava is dilated in size with <50% respiratory  variability, suggesting right atrial pressure of 15 mmHg.   EKG:  EKG is ordered today.  The ekg ordered today demonstrates paced rhythm  Recent Labs: 01/13/2022: BUN 62; Creatinine, Ser 2.00; Potassium 4.8; Sodium 136 02/24/2022:  Hemoglobin 10.6; Platelet Count 193  Recent Lipid Panel No results found for: "CHOL", "TRIG", "HDL", "CHOLHDL", "VLDL", "LDLCALC", "LDLDIRECT"   Risk Assessment/Calculations:    CHA2DS2-VASc Score = 5   This indicates a 7.2% annual risk of stroke. The patient's score is based upon: CHF History: 0 HTN History: 1 Diabetes History: 1 Stroke History: 0 Vascular Disease History: 0 Age Score: 2 Gender Score: 1          Physical Exam:    VS:  BP 138/78   Pulse 68   Ht _0  (1.473 m)   Wt 195 lb 12.8 oz (88.8 kg)   SpO2 99%   BMI 40.92 kg/m        Wt Readings from Last 3 Encounters:  03/17/22 195 lb 12.8 oz (88.8 kg)  02/24/22 195 lb 8 oz (88.7 kg)  02/06/22 191 lb 3.2 oz (86.7 kg)     GEN:  Well nourished, well developed in no acute distress HEENT: Normal NECK: No JVD; No carotid bruits LYMPHATICS: No lymphadenopathy CARDIAC: RRR, no murmurs, rubs, gallops RESPIRATORY:  Clear to auscultation without rales, wheezing or rhonchi  ABDOMEN: Soft, non-tender, non-distended MUSCULOSKELETAL:  1-2+ edema; No deformity  SKIN: Warm and dry NEUROLOGIC:  Alert and oriented x 3 PSYCHIATRIC:  Normal affect   ASSESSMENT:    1. DOE (dyspnea on exertion)   2. PAF (paroxysmal atrial fibrillation) ([place])   3. Essential hypertension   4. Hyperlipidemia LDL goal <70   5. Controlled type 2 diabetes mellitus without complication, without long-term  current use of insulin ([place])   6. Chronic kidney disease (CKD), stage IV (severe) ([place])   7. Pacemaker    PLAN:    In order of problems listed above:  Dyspnea on exertion: Patient has been noticing elevated heart rate and suspect she has went back into atrial fibrillation, interestingly she never had dyspnea on exertion with previous atrial fibrillation in 2022.  EKG showed a paced rhythm.  I do not think her symptom is truly due to atrial fibrillation.  I asked her to send in a remote transmission to our device clinic to review.   According to our device clinic, she only had 1 minute of A-fib on 02/16/2022, this is the only episode of A-fib.  She does have lower extremity edema, I wanted to increase her torsemide, however she is hesitant to do so as she has poor kidney function.  On physical exam, she also appears to have a louder heart murmur, previous limited echocardiogram from 2022 demonstrated moderate to severe MR, I am concerned that her MR has worsened.  I recommended a repeat echocardiogram.  Obtain basic metabolic panel and a BNP.  PAF: Her device was interrogated.  There has been no significant recurrent A-fib recently.  Continue Eliquis and carvedilol  Hypertension: Blood pressure stable  Hyperlipidemia: On Crestor  DM2: Managed by primary care provider  CKD stage IV: Followed by nephrology service  History of pacemaker: EKG showed paced rhythm, will do remote transmission to see if she has had recent A-fib.           Medication Adjustments/Labs and Tests Ordered: Current medicines are reviewed at length with the patient today.  Concerns regarding medicines are outlined above.  Orders Placed This Encounter  Procedures   Basic Metabolic Panel (BMET)   B Nat Peptide   EKG 12-Lead   ECHOCARDIOGRAM COMPLETE   No orders of the defined types were placed in this encounter.   Patient Instructions  Medication Instructions:  Your physician recommends that you continue on your current medications as directed. Please refer to the Current Medication list given to you today.  *If you need a refill on your cardiac medications before your next appointment, please call your pharmacy*  When you get home today please send a remote transmission from your device. We will have the device clinic look over it.    Lab Work: Your physician recommends that when you come in for your echo to have the following labs drawn: BNP and BMET  If you have labs (blood work) drawn today and your tests are completely normal, you  will receive your results only by: [place] (if you have MyChart) OR A paper copy in the mail If you have any lab test that is abnormal or we need to change your treatment, we will call you to review the results.   Testing/Procedures: Your physician has requested that you have an echocardiogram. Echocardiography is a painless test that uses sound waves to create images of your heart. It provides your doctor with information about the size and shape of your heart and how well your heart's chambers and valves are working. This procedure takes approximately one hour. There are no restrictions for this procedure.    Follow-Up: At [facility], you and your health needs are our priority.  As part of our continuing mission to provide you with exceptional heart care, we have created designated Provider Care Teams.  These Care Teams include your primary Cardiologist (physician) and Advanced Practice  Providers (APPs -  Physician Assistants and Nurse Practitioners) who all work together to provide you with the care you need, when you need it.  We recommend signing up for the patient portal called "MyChart".  Sign up information is provided on this After Visit Summary.  MyChart is used to connect with patients for Virtual Visits (Telemedicine).  Patients are able to view lab/test results, encounter notes, upcoming appointments, etc.  Non-urgent messages can be sent to your provider as well.   To learn more about what you can do with MyChart, go to [email or web address].    Your next appointment:   4 week(s)  The format for your next appointment:   In Person  Provider:   Almyra Deforest, PA-C on a day Dr. Audie Box is in the office or with  Evalina Field, MD    Signed, Tricia Gonzales, [place]  03/19/2022 11:58 PM    [place]

## 2022-03-17 NOTE — Patient Instructions (Signed)
Medication Instructions:  Your physician recommends that you continue on your current medications as directed. Please refer to the Current Medication list given to you today.  *If you need a refill on your cardiac medications before your next appointment, please call your pharmacy*  When you get home today please send a remote transmission from your device. We will have the device clinic look over it.    Lab Work: Your physician recommends that when you come in for your echo to have the following labs drawn: BNP and BMET  If you have labs (blood work) drawn today and your tests are completely normal, you will receive your results only by: [place] (if you have MyChart) OR A paper copy in the mail If you have any lab test that is abnormal or we need to change your treatment, we will call you to review the results.   Testing/Procedures: Your physician has requested that you have an echocardiogram. Echocardiography is a painless test that uses sound waves to create images of your heart. It provides your doctor with information about the size and shape of your heart and how well your heart's chambers and valves are working. This procedure takes approximately one hour. There are no restrictions for this procedure.    Follow-Up: At [facility], you and your health needs are our priority.  As part of our continuing mission to provide you with exceptional heart care, we have created designated Provider Care Teams.  These Care Teams include your primary Cardiologist (physician) and Advanced Practice Providers (APPs -  Physician Assistants and Nurse Practitioners) who all work together to provide you with the care you need, when you need it.  We recommend signing up for the patient portal called "MyChart".  Sign up information is provided on this After Visit Summary.  MyChart is used to connect with patients for Virtual Visits (Telemedicine).  Patients are able to view lab/test results,  encounter notes, upcoming appointments, etc.  Non-urgent messages can be sent to your provider as well.   To learn more about what you can do with MyChart, go to [email or web address].    Your next appointment:   4 week(s)  The format for your next appointment:   In Person  Provider:   Almyra Deforest, PA-C on a day Dr. Audie Box is in the office or with  Evalina Field, MD

## 2022-03-19 ENCOUNTER — Encounter: Payer: Self-pay | Admitting: Physician Assistant

## 2022-03-20 ENCOUNTER — Ambulatory Visit (HOSPITAL_COMMUNITY): Payer: Medicare Other | Attending: Physician Assistant

## 2022-03-20 ENCOUNTER — Ambulatory Visit: Payer: Medicare Other

## 2022-03-20 ENCOUNTER — Telehealth: Payer: Self-pay | Admitting: Cardiovascular Disease

## 2022-03-20 DIAGNOSIS — Z79899 Other long term (current) drug therapy: Secondary | ICD-10-CM | POA: Insufficient documentation

## 2022-03-20 DIAGNOSIS — I5032 Chronic diastolic (congestive) heart failure: Secondary | ICD-10-CM | POA: Diagnosis present

## 2022-03-20 DIAGNOSIS — R0609 Other forms of dyspnea: Secondary | ICD-10-CM

## 2022-03-20 DIAGNOSIS — I4819 Other persistent atrial fibrillation: Secondary | ICD-10-CM

## 2022-03-20 NOTE — Telephone Encounter (Signed)
Tricia Gonzales came in today and had her ECHO per Almyra Deforest PA and after her test stated she would like to transition her care to our office. Tricia Gonzales would like to see Dr Irish Lack instead of Dr Marisue Ivan. Is this switch ok?

## 2022-03-21 LAB — BASIC METABOLIC PANEL
BUN/Creatinine Ratio: 31 — ABNORMAL HIGH (ref 12–28)
BUN: 67 mg/dL — ABNORMAL HIGH (ref 8–27)
CO2: 28 mmol/L (ref 20–29)
Calcium: 9.3 mg/dL (ref 8.7–10.3)
Chloride: 100 mmol/L (ref 96–106)
Creatinine, Ser: 2.16 mg/dL — ABNORMAL HIGH (ref 0.57–1.00)
Glucose: 116 mg/dL — ABNORMAL HIGH (ref 70–99)
Potassium: 4.7 mmol/L (ref 3.5–5.2)
Sodium: 141 mmol/L (ref 134–144)
eGFR: 23 mL/min/{1.73_m2} — ABNORMAL LOW (ref >59)

## 2022-03-21 LAB — PRO B NATRIURETIC PEPTIDE: NT-Pro BNP: 65249 pg/mL — ABNORMAL HIGH (ref 0–738)

## 2022-03-22 LAB — ECHOCARDIOGRAM COMPLETE
MV M vel: 5.16 m/s
MV Peak grad: 106.5 mmHg
S' Lateral: 4.1 cm

## 2022-03-24 ENCOUNTER — Telehealth: Payer: Self-pay | Admitting: Physician Assistant

## 2022-03-24 NOTE — Telephone Encounter (Signed)
Spoke with patient who wanted echo results. Informed patient that echo notation has not been made yet. Patient was noticeably sob on phone. She had been outside watering her plants. As the conversation progressed, her breathing was less sob.

## 2022-03-24 NOTE — Telephone Encounter (Signed)
Patient is calling for results to her echo.

## 2022-03-26 ENCOUNTER — Telehealth: Payer: Self-pay | Admitting: Cardiovascular Disease

## 2022-03-26 NOTE — Telephone Encounter (Signed)
I spoke directly with the patient, plan to bring her back to the clinic tomorrow at 11:45AM to discuss the result. In short, suspect worsening EF on echo due to worsening mitral regurgitation. Lab work suggested volume overload. I tried to convince her to take more diuretic last week but she was very hesitant due to stage IV CKD. I recommended again today to increase torsemide to 4m daily

## 2022-03-26 NOTE — Telephone Encounter (Signed)
Called patient- she states she just wants her ECHO/LAB results, no results are available at this time.  Message sent to PA, but no response yet.   Patient did provider switch- both MD's agreed. Patient will be seeing Dr.Varanasi. - will route to him to see if he can review as well. Thank you!

## 2022-03-26 NOTE — Telephone Encounter (Signed)
Patient is requesting a call back to discuss lab results.

## 2022-03-26 NOTE — Telephone Encounter (Signed)
Patient scheduled for 10/06 at 11:45 per [facility] request.   Thanks!

## 2022-03-26 NOTE — Progress Notes (Signed)
Pumping function is clearly lower than 2022 echo, spoke with the patient, will try to bring her back to the clinic tomorrow at 11:45AM as an add-on to discuss result. I suspect her mitral regurgitation is likely worse, probably in the severe range. This likely has caused her EF to drop as well. Will discuss further during office visit tomorrow.

## 2022-03-26 NOTE — Telephone Encounter (Signed)
I spoke with the patient, will work her in tomorrow at 11:45AM to see me. I suspect her mitral valve is now severe and this is causing her to have increased fluid build up and worsening ejection fraction on echo. Discussed blood work and echo. I recommended her to increase torsemide to 6m daily (she is currently taking 29mdaily for 2 days then 40 mg every third day), she is very hesitant to take higher dose of diuretic. Please place her on my schedule for tomorrow as an add-on for 11:45AM

## 2022-03-26 NOTE — Progress Notes (Signed)
Very high pro-BNP suggesting volume overload, spoke with the patient, she thinks she is taking 47m daily torsemide for 2 days and then 436mevery third day. I recommended for her to increase torsemide to 4075maily, she is still very hesitant given her stage IV chronic kidney disease. Renal function stable.

## 2022-03-27 ENCOUNTER — Ambulatory Visit: Payer: Medicare Other | Attending: Physician Assistant | Admitting: Physician Assistant

## 2022-03-27 ENCOUNTER — Encounter: Payer: Self-pay | Admitting: Physician Assistant

## 2022-03-27 ENCOUNTER — Telehealth: Payer: Self-pay

## 2022-03-27 VITALS — BP 122/84 | HR 109 | Ht <= 58 in | Wt 195.0 lb

## 2022-03-27 DIAGNOSIS — I4719 Other supraventricular tachycardia: Secondary | ICD-10-CM

## 2022-03-27 DIAGNOSIS — I495 Sick sinus syndrome: Secondary | ICD-10-CM | POA: Diagnosis not present

## 2022-03-27 DIAGNOSIS — I340 Nonrheumatic mitral (valve) insufficiency: Secondary | ICD-10-CM

## 2022-03-27 DIAGNOSIS — I517 Cardiomegaly: Secondary | ICD-10-CM | POA: Diagnosis not present

## 2022-03-27 DIAGNOSIS — I519 Heart disease, unspecified: Secondary | ICD-10-CM

## 2022-03-27 DIAGNOSIS — I5021 Acute systolic (congestive) heart failure: Secondary | ICD-10-CM

## 2022-03-27 MED ORDER — TORSEMIDE 20 MG PO TABS: 40.0000 mg | ORAL_TABLET | Freq: Every day | ORAL | 3 refills | Status: AC

## 2022-03-27 NOTE — Progress Notes (Signed)
Cardiology Office Note:    Date:  03/29/2022   ID:  BRITENY FULGHUM, DOB [birth year], MRN [id number]  PCP:  Carol Ada, [place] Prov[facility] Cardiologist:  Larae Grooms, MD Electrophysiologist:  Cristopher Peru, MD     Referring MD: Carol Ada, MD   Chief Complaint  Patient presents with   Follow-up    Seen for Dr. Irish Lack    History of Present Illness:    Tricia Gonzales is a 81 y.o. female with a hx of PAF, HFpEF, HTN, HLD, DM2, and CKD stage IV.  She has been followed by nephrology service.  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  Patient was previously diagnosed with PAF and underwent DCCV on 07/31/2020.  Limited echocardiogram obtained at the time showed EF 50 to 55%, moderately elevated pulmonary artery systolic pressure, moderate to severe MR.  She has a history of GI bleed and Sjogren's.  Patient was last seen by Dr. Audie Box on 02/06/2022 at which time she was maintaining sinus rhythm and has been compliant with Eliquis 2.5 mg twice a day.  Due to elevated blood pressure, carvedilol was increased to 6.25 mg twice a day.  33-monthfollow-up was recommended.  I saw the patient on 03/17/2022 for evaluation of dyspnea on exertion that has been going on for several month.  She says her symptom has been going on since her cardiac medication was adjusted including increased carvedilol and the lowering of Eliquis.  I told the patient that medication adjustment is unlikely to have caused her symptom.  I suspected her shortness of breath was caused by worsening valve issue and recommended echocardiogram.  We did a remote interrogation, she is maintaining sinus rhythm.  I asked her to increase torsemide to 20 mg twice a day for 3 days before going down to the previous dose, however she was hesitant to do so given her poor renal function.  Blood work obtained during the last office visit showed creatinine was 2.16.  proBNP was  65,000.  Subsequent echocardiogram obtained on 03/20/2022 showed EF 25 to 30%, moderately enlarged RV with RV systolic pressure 454.5mmHg, moderately dilated left and the right atrium, moderate to severe MR was significant artifact and eccentric jet posteriorly directed, may be underestimated, moderate MAC, severe TR.  Talking with the patient today, she continued to have dyspnea on exertion.  She has spoke with her nephrologist who is okay with her increasing the diuretic.  I will increase her torsemide to 40 mg daily.  At first, we suspected her EF dropped because of worsening mitral regurgitation.  She denied any chest pain.  She is not a very ideal candidate for left and right heart cath due to poor renal function.  The case was discussed with Dr. CSallyanne Kuster DOD.  EKG today shows she is in atrial tachycardia which is different from her rhythm a week ago.  Dr. CSallyanne Kusterinterrogated her device, she is not in atrial fibrillation and only had low A-fib burden.  He was able to proceed with antitachycardia pacing and paced patient out of this fast atrial tachycardia episode.  Based on the recent interrogation, patient has been having frequent episode of atrial tachycardia.  It was suspected this has contributed to her weakening ejection fraction.  We discussed the possibility of starting her on amiodarone, however she says she has tried amiodarone in the past and could not tolerate it.  Dr. CSallyanne Kusterrecommended early follow-up with  device clinic for serial monitoring and earlier visit with Dr. Lovena Le to discuss other treatment for her frequent atrial tachycardic episodes.  I recommended repeat basic metabolic panel next week to follow-up on her renal function.  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   Anxiety    Arthritis    Atrial fibrillation ([place])    Avascular necrosis (HCC) hip left   leg pain also   Chronic back pain    Chronic kidney disease (CKD),  stage III (moderate) (HCC)    Depression    Diverticulosis    Early cataracts, bilateral    Esophageal stricture    Gastritis    GAVE (gastric antral vascular ectasia)    GERD (gastroesophageal reflux disease)    Glaucoma    Heart murmur     " some doctors say that I have one some say that I don"t "   Hiatal hernia    History of blood transfusion    "@ least w/1st knee OR"   History of gout    History of kidney stones    Hyperlipidemia    Hypertension    Intestinal obstruction ([place])    Kidney stones    Obesity    Osteoarthritis    Osteoporosis    Pericarditis 2016   Presence of permanent cardiac pacemaker    Sjogren's disease ([place])    Thyroid disease    Type II diabetes mellitus ([place])    Vitamin B12 deficiency     Past Surgical History:  Procedure Laterality Date   ABDOMINAL HYSTERECTOMY     complete   CARDIOVERSION N/A 07/31/2020   Procedure: CARDIOVERSION;  Surgeon: Werner Lean, MD;  Location: [facility];  Service: Cardiovascular;  Laterality: N/A;   CHOLECYSTECTOMY OPEN     COLECTOMY     for rectovaginal fistula   COLONOSCOPY  01/07/2012   Procedure: COLONOSCOPY;  Surgeon: Lafayette Dragon, MD;  Location: [facility];  Service: Endoscopy;  Laterality: N/A;   EP IMPLANTABLE DEVICE N/A 06/18/2015   Procedure: Pacemaker Implant;  Surgeon: Evans Lance, MD;  Location: [facility];  Service: Cardiovascular;  Laterality: N/A;   ESOPHAGEAL DILATION  08/17/2020   Procedure: ESOPHAGEAL DILATION;  Surgeon: Lavena Bullion, DO;  Location: [facility];  Service: Gastroenterology;;   ESOPHAGOGASTRODUODENOSCOPY N/A 11/24/2018   Procedure: ESOPHAGOGASTRODUODENOSCOPY (EGD);  Surgeon: Yetta Flock, MD;  Location: [facility];  Service: Gastroenterology;  Laterality: N/A;   ESOPHAGOGASTRODUODENOSCOPY (EGD) WITH ESOPHAGEAL DILATION     ESOPHAGOGASTRODUODENOSCOPY (EGD) WITH PROPOFOL N/A 08/12/2015   Procedure: ESOPHAGOGASTRODUODENOSCOPY (EGD) WITH PROPOFOL;   Surgeon: Manus Gunning, MD;  Location: [place];  Service: Gastroenterology;  Laterality: N/A;   ESOPHAGOGASTRODUODENOSCOPY (EGD) WITH PROPOFOL N/A 08/17/2020   Procedure: ESOPHAGOGASTRODUODENOSCOPY (EGD) WITH PROPOFOL;  Surgeon: Lavena Bullion, DO;  Location: [place] [facility];  Service: Gastroenterology;  Laterality: N/A;   EYE SURGERY Bilateral    cataracts   HOT HEMOSTASIS N/A 11/24/2018   Procedure: HOT HEMOSTASIS (ARGON PLASMA COAGULATION/BICAP);  Surgeon: Yetta Flock, MD;  Location: [facility];  Service: Gastroenterology;  Laterality: N/A;   HOT HEMOSTASIS N/A 08/17/2020   Procedure: HOT HEMOSTASIS (ARGON PLASMA COAGULATION/BICAP);  Surgeon: Lavena Bullion, DO;  Location: [facility];  Service: Gastroenterology;  Laterality: N/A;   I & D EXTREMITY Right 08/06/2016   Procedure: DEBRIDEMENT PIP RIGHT RING FINGER;  Surgeon: Daryll Brod, MD;  Location: [place];  Service: Orthopedics;  Laterality: Right;  INSERT / REPLACE / REMOVE PACEMAKER     JOINT REPLACEMENT     KNEE ARTHROSCOPY Right    MASS EXCISION Right 08/06/2016   Procedure: EXCISION CYST;  Surgeon: Daryll Brod, MD;  Location: [place] [facility];  Service: Orthopedics;  Laterality: Right;   REVISION TOTAL KNEE ARTHROPLASTY Left    TOTAL KNEE ARTHROPLASTY Bilateral     Current Medications: Current Meds  Medication Sig   ACCU-CHEK AVIVA PLUS test strip 1 each by Other route daily.    acetaminophen (TYLENOL) 650 MG CR tablet Take 1,300 mg by mouth every evening.   BD PEN NEEDLE NANO U/F 32G X 4 MM MISC See admin instructions.   brimonidine (ALPHAGAN) 0.2 % ophthalmic solution Place 1 drop into the right eye 2 (two) times daily.   carvedilol (COREG) 6.25 MG tablet Take 1 tablet by mouth twice daily   Cholecalciferol (VITAMIN D3) 25 MCG (1000 UT) CAPS Take 1,000 Units by mouth daily.   cycloSPORINE (RESTASIS) 0.05 % ophthalmic emulsion Place 1 drop into both eyes 2 (two) times daily.   dicyclomine (BENTYL) 10 MG capsule  Take 1 capsule (10 mg total) by mouth every 8 (eight) hours as needed for spasms.   dorzolamide (TRUSOPT) 2 % ophthalmic solution Place 1 drop into the right eye 2 (two) times daily.   ELIQUIS 2.5 MG TABS tablet TAKE ONE TABLET TWICE DAILY   febuxostat (ULORIC) 40 MG tablet Take 1 tablet (40 mg total) by mouth every Monday, Wednesday, and Friday.   ferrous sulfate 325 (65 FE) MG EC tablet Take 1 tablet (325 mg total) by mouth 2 (two) times daily.   gabapentin (NEURONTIN) 400 MG capsule Take 1 capsule (400 mg total) by mouth at bedtime.   levothyroxine (SYNTHROID) 75 MCG tablet Take 75 mcg by mouth every morning.   lidocaine (LIDODERM) 5 % Place 1-3 patches onto the skin See admin instructions. Apply 1-3 patches transdermally every 24 hours as needed for pain and remove & discard patches within 12 hours or as directed by MD   montelukast (SINGULAIR) 10 MG tablet Take 1 tablet (10 mg total) by mouth at bedtime.   nystatin (NYAMYC) powder APPLY TOPICALLY TO LEFT ABDOMINAL FOLD AND GROIN TWICE A DAY   ondansetron (ZOFRAN-ODT) 4 MG disintegrating tablet Take 1 tablet (4 mg total) by mouth every 8 (eight) hours as needed for nausea or vomiting.   pantoprazole (PROTONIX) 40 MG tablet Take 40 mg by mouth daily.   pentoxifylline (TRENTAL) 400 MG CR tablet TAKE ONE TABLET EACH DAY WITH A MEAL   Polyethylene Glycol 3350 (MIRALAX PO) Take 17 g by mouth in the morning and at bedtime.   rOPINIRole (REQUIP) 0.5 MG tablet Take 0.5 mg by mouth at bedtime.   rosuvastatin (CRESTOR) 5 MG tablet Take 1 tablet (5 mg total) by mouth daily.   sennosides-docusate sodium (SENOKOT-S) 8.6-50 MG tablet 1 tablet   SF 5000 PLUS 1.1 % CREA dental cream Take by mouth as directed.   SIMBRINZA 1-0.2 % SUSP Apply 1 drop to eye 2 (two) times daily.   traMADol (ULTRAM) 50 MG tablet Take 50 mg by mouth See admin instructions. Take 50 mg by mouth at bedtime and an additional 50 mg once a day as needed for pain   Travoprost, BAK Free,  (TRAVATAN) 0.004 % SOLN ophthalmic solution SMARTSIG:1 Drop(s) Right Eye Every Evening   TRESIBA FLEXTOUCH 100 UNIT/ML FlexTouch Pen Inject 10 Units into the skin every evening.   [DISCONTINUED] torsemide (DEMADEX) 20 MG  tablet Take 1 tablet (20 mg total) by mouth daily.     Allergies:   Norvasc [amlodipine besylate], Other, Amlodipine besylate, Avapro [irbesartan], Glucophage [metformin hydrochloride], Lisinopril, Losartan, Morphine and related, Simvastatin, and Trulicity [dulaglutide]   Social History   Socioeconomic History   Marital status: Divorced    Spouse name: Not on file   Number of children: 2   Years of education: Not on file   Highest education level: Not on file  Occupational History   Occupation: retired  Tobacco Use   Smoking status: Former    Packs/day: 2.00    Years: 3.00    Total pack years: 6.00    Types: Cigarettes    Quit date: 06/23/1983    Years since quitting: 38.7    Passive exposure: Never   Smokeless tobacco: Never  Vaping Use   Vaping Use: Never used  Substance and Sexual Activity   Alcohol use: Yes    Alcohol/week: 2.0 standard drinks of alcohol    Types: 2 Glasses of wine per week    Comment: occ   Drug use: No   Sexual activity: Not Currently  Other Topics Concern   Not on file  Social History Narrative   Not on file   Social Determinants of Health   Financial Resource Strain: Not on file  Food Insecurity: Not on file  Transportation Needs: Not on file  Physical Activity: Not on file  Stress: Not on file  Social Connections: Not on file     Family History: The patient's family history includes Diabetes in her brother and mother; Hypertension in her sister. There is no history of Colon cancer, Heart attack, Stroke, Colon polyps, Esophageal cancer, Rectal cancer, or Stomach cancer.  ROS:   Please see the history of present illness.     All other systems reviewed and are negative.  EKGs/Labs/Other Studies Reviewed:    The  following studies were reviewed today:  Echo 03/20/2022  1. Left ventricular ejection fraction, by estimation, is 25 to 30%. The  left ventricle has severely decreased function. The left ventricle  demonstrates global hypokinesis. There is mild left ventricular  hypertrophy. Left ventricular diastolic parameters   are indeterminate.   2. Right ventricular systolic function is moderately reduced. The right  ventricular size is moderately enlarged. There is mildly elevated  pulmonary artery systolic pressure. The estimated right ventricular  systolic pressure is 16.3 mmHg.   3. Left atrial size was moderately dilated.   4. Right atrial size was mild to moderately dilated.   5. The mitral valve is degenerative. Moderate to severe mitral valve  regurgitation with significant splay artifact and eccentric jet  posteriorly directed, may be underestimated. Moderate MAC. No evidence of  mitral stenosis.   6. Tricuspid valve regurgitation is severe.   7. The aortic valve is tricuspid. There is mild calcification of the  aortic valve. There is moderate thickening of the aortic valve. Aortic  valve regurgitation is trivial. No aortic stenosis is present.   8. The inferior vena cava is dilated in size with <50% respiratory  variability, suggesting right atrial pressure of 15 mmHg.   Comparison(s): A prior study was performed on 08/16/20. Changes from prior  study are noted.   EKG:  EKG is ordered today.  The ekg ordered today demonstrates atrial tachycardia  Recent Labs: 02/24/2022: Hemoglobin 10.6; Platelet Count 193 03/20/2022: BUN 67; Creatinine, Ser 2.16; NT-Pro BNP 65,249; Potassium 4.7; Sodium 141  Recent Lipid Panel No results found for: "  CHOL", "TRIG", "HDL", "CHOLHDL", "VLDL", "LDLCALC", "LDLDIRECT"   Risk Assessment/Calculations:    CHA2DS2-VASc Score = 5   This indicates a 7.2% annual risk of stroke. The patient's score is based upon: CHF History: 0 HTN History: 1 Diabetes  History: 1 Stroke History: 0 Vascular Disease History: 0 Age Score: 2 Gender Score: 1          Physical Exam:    VS:  BP 122/84   Pulse (!) 109   Ht _0  (1.473 m)   Wt 195 lb (88.5 kg)   SpO2 92%   BMI 40.76 kg/m        Wt Readings from Last 3 Encounters:  03/27/22 195 lb (88.5 kg)  03/17/22 195 lb 12.8 oz (88.8 kg)  02/24/22 195 lb 8 oz (88.7 kg)     GEN:  Well nourished, well developed in no acute distress HEENT: Normal NECK: No JVD; No carotid bruits LYMPHATICS: No lymphadenopathy CARDIAC: RRR, no murmurs, rubs, gallops RESPIRATORY:  Clear to auscultation without rales, wheezing or rhonchi  ABDOMEN: Soft, non-tender, non-distended MUSCULOSKELETAL:  1-2+ edema; No deformity  SKIN: Warm and dry NEUROLOGIC:  Alert and oriented x 3 PSYCHIATRIC:  Normal affect   ASSESSMENT:    1. Acute systolic heart failure ([place])   2. Atrial tachycardia   3. Tachycardia-bradycardia syndrome ([place])   4. Enlarged RV (right ventricle)   5. Mitral valve insufficiency, unspecified etiology   6. LV dysfunction    PLAN:    In order of problems listed above:  Acute systolic heart failure: Patient appears to be volume overloaded.  Recent proBNP was 60,000.  I will increase her torsemide to 40 mg daily  Atrial tachycardia: Initially I suspected her EF dropped because of worsening mitral regurgitation, however on device interrogation by Dr. Sallyanne Kuster, it was noted she has frequent episode of atrial tachycardia.  She was seen paced rhythm was normal heart rate during the last office visit last week.  Device interrogation shows she has had frequent episode of atrial tachycardia for the past 81-month  It was felt her atrial tachycardia is a likely culprit that caused her EF to be low.  Dr. CSallyanne Kusterwas able to successfully paced her out of atrial tachycardia.  He recommended early follow-up with Dr. TLovena Leto discuss options to help control her heart rate.  She mentioned she could not  tolerate amiodarone in the past.  We recommend repeat limited echocardiogram in 1 month to follow-up on her EF.  Enlarged RV: Repeat echocardiogram.  See #2  Mitral regurgitation: Initially I suspected her MR has been worsened, I repeated her echocardiogram last week, echo showed moderate to severe MR, however this may have been underestimated.  Normally we would have recommended left and right heart cath, however her poor renal function make her poor candidate for cardiac catheterization  LV dysfunction: See #1.  Potential cause would be either tachycardic induced or worsening mitral regurgitation.  The case was discussed with Dr. CSallyanne Kusterwho felt her frequent atrial tachycardia is primary cause in this case.  He was able to successfully place the patient out of atrial tachycardia.           Medication Adjustments/Labs and Tests Ordered: Current medicines are reviewed at length with the patient today.  Concerns regarding medicines are outlined above.  Orders Placed This Encounter  Procedures   Basic metabolic panel   Pro b natriuretic peptide (BNP)   EKG 12-Lead   ECHOCARDIOGRAM LIMITED   Meds ordered this  encounter  Medications   torsemide (DEMADEX) 20 MG tablet    Sig: Take 2 tablets (40 mg total) by mouth daily.    Dispense:  90 tablet    Refill:  3    Dose change, new Rx    Patient Instructions  Medication Instructions:  INCREASE Torsemide to 40 mg (2 tablets) daily  *If you need a refill on your cardiac medications before your next appointment, please call your pharmacy*  Lab Work:  Almyra Deforest, PA-C recommends that you return for lab work in 5 days (Wednesday 04/01/22):  BMP  If you have labs (blood work) drawn today and your tests are completely normal, you will receive your results only by: [place] (if you have MyChart) OR A paper copy in the mail If you have any lab test that is abnormal or we need to change your treatment, we will call you to review the  results.  Testing/Procedures: Your physician has requested that you have an echocardiogram. Echocardiography is a painless test that uses sound waves to create images of your heart. It provides your doctor with information about the size and shape of your heart and how well your heart's chambers and valves are working. This procedure takes approximately one hour. There are no restrictions for this procedure.  Please schedule for 4 weeks   Follow-Up: At [facility], you and your health needs are our priority.  As part of our continuing mission to provide you with exceptional heart care, we have created designated Provider Care Teams.  These Care Teams include your primary Cardiologist (physician) and Advanced Practice Providers (APPs -  Physician Assistants and Nurse Practitioners) who all work together to provide you with the care you need, when you need it.  We recommend signing up for the patient portal called "MyChart".  Sign up information is provided on this After Visit Summary.  MyChart is used to connect with patients for Virtual Visits (Telemedicine).  Patients are able to view lab/test results, encounter notes, upcoming appointments, etc.  Non-urgent messages can be sent to your provider as well.   To learn more about what you can do with MyChart, go to [email or web address].    Your next appointment:   2-3 week(s) 5 week(s)  The format for your next appointment:   In Person In Person  Provider:   You may see Cristopher Peru, MD or one of the following Advanced Practice Providers on your designated Care Team:   Tommye Standard, [place] Legrand Como "Jonni Sanger" [place], [place]    Almyra Deforest, [place] Other Instructions   Important Information About Lisa Roca, [place]  03/29/2022 11:54 PM    [place]

## 2022-03-27 NOTE — Patient Instructions (Addendum)
Medication Instructions:  INCREASE Torsemide to 40 mg (2 tablets) daily  *If you need a refill on your cardiac medications before your next appointment, please call your pharmacy*  Lab Work:  Almyra Deforest, PA-C recommends that you return for lab work in 5 days (Wednesday 04/01/22):  BMP  If you have labs (blood work) drawn today and your tests are completely normal, you will receive your results only by: [place] (if you have MyChart) OR A paper copy in the mail If you have any lab test that is abnormal or we need to change your treatment, we will call you to review the results.  Testing/Procedures: Your physician has requested that you have an echocardiogram. Echocardiography is a painless test that uses sound waves to create images of your heart. It provides your doctor with information about the size and shape of your heart and how well your heart's chambers and valves are working. This procedure takes approximately one hour. There are no restrictions for this procedure.  Please schedule for 4 weeks   Follow-Up: At [facility], you and your health needs are our priority.  As part of our continuing mission to provide you with exceptional heart care, we have created designated Provider Care Teams.  These Care Teams include your primary Cardiologist (physician) and Advanced Practice Providers (APPs -  Physician Assistants and Nurse Practitioners) who all work together to provide you with the care you need, when you need it.  We recommend signing up for the patient portal called "MyChart".  Sign up information is provided on this After Visit Summary.  MyChart is used to connect with patients for Virtual Visits (Telemedicine).  Patients are able to view lab/test results, encounter notes, upcoming appointments, etc.  Non-urgent messages can be sent to your provider as well.   To learn more about what you can do with MyChart, go to [email or web address].    Your next appointment:    2-3 week(s) 5 week(s)  The format for your next appointment:   In Person In Person  Provider:   You may see Cristopher Peru, MD or one of the following Advanced Practice Providers on your designated Care Team:   Tommye Standard, [place] Legrand Como "Jonni Sanger" [place], PA-C    Almyra Deforest, [place] Other Instructions   Important Information About [place]

## 2022-03-27 NOTE — Progress Notes (Signed)
Patient with complicated cardiac history being seen for heart failure exacerbation. There has been a marked reduction in left ventricular systolic function, now with EF 25-30% over a few months.. She has 100% ventricular paced rhythm.  There is prominent ventricular pacing related dyssynchrony, but this cannot be incriminated for the change in LVEF. Device interrogation shows that she is in a atrial sensed, ventricular paced tachycardic rhythm that is consistently exactly 115 bpm. Over the last year she has mostly been 100% atrial paced-ventricular paced.  Repeated episodes of this atrial tachycardia have been seen during the last 30 days or so. Overdrive pacing with a cycle length of 450 bpm promptly led to termination of the tachyarrhythmia, reverting to atrial paced-ventricular paced rhythm at 60 bpm, with periods of sinus rhythm at about 61-62 bpm.  Frequent PACs are seen. Atrial preference pacing was turned on.  No other changes were made to device settings. She has recently had the dose of carvedilol increased but is having this arrhythmia nonetheless.  Suspect she will need true antiarrhythmic therapy.  She has a history of nausea with amiodarone in the past.  Will defer to Dr. Lovena Le.

## 2022-03-27 NOTE — Telephone Encounter (Addendum)
Called and left a voice message for patient to call back for Echo and lab results.  ----- Message from [place], [place] sent at 03/26/2022  2:07 PM EDT ----- Pumping function is clearly lower than 2022 echo, spoke with the patient, will try to bring her back to the clinic tomorrow at 11:45AM as an add-on to discuss result. I suspect her mitral regurgitation is likely worse, probably in the severe range. T his likely has caused her EF to drop as well. Will discuss further during office visit tomorrow.   ----- Message from [place], [place] sent at 03/26/22 2:05 PM EDT ----- Very high pro-BNP suggesting volume overload, spoke with the patient, she thinks she is taking 60m daily torsemide for 2 days and then 471mevery third day. I recommended for her to increase torsemide to 4043maily, she is still very hesitant given her stage IV chronic kidney disease. Renal function stable.

## 2022-03-29 ENCOUNTER — Encounter: Payer: Self-pay | Admitting: Physician Assistant

## 2022-03-30 ENCOUNTER — Telehealth: Payer: Self-pay | Admitting: Physician Assistant

## 2022-03-30 NOTE — Telephone Encounter (Signed)
  Pt c/o Shortness Of Breath: STAT if SOB developed within the last 24 hours or pt is noticeably SOB on the phone  1. Are you currently SOB (can you hear that pt is SOB on the phone)? No   2. How long have you been experiencing SOB?  3. Are you SOB when sitting or when up moving around? Moving around   4. Are you currently experiencing any other symptoms?   Pt was seen at [facility] today and Estrellita Ludwig is reporting, while pt was there pt oxygen level is at 89-91% and it took them 40 mins to bring it up to 95%. Pt did not exercise and continue to get SOB with exertion. They would like to know how to proceed with pt, if they need to hold PT

## 2022-03-30 NOTE — Telephone Encounter (Signed)
Remote transmission received and reviewed 03/30/22. Presenting rhythm AS/VP 122 bpm. No episodes noted.

## 2022-03-30 NOTE — Telephone Encounter (Signed)
Left message for Tricia Gonzales at PT to call back. Her note stated patient became sob during PT, sat dropped to 89 and took 40 minutes to get to 95%. PT wants to know how to proceed if they need to hold PT. I called patient who stated she gets sob with exertion. Pulse ox 95%, P 96. This morning, she went for a manicure, walked across the parking lot to PT, and was exhausted for PT. Suggested she use the handicap parking. She stated it was far from the building. Also recommended that she not plan to do activities on the day of PT. She said after Dr. Sallyanne Kuster adjusted her pacer on 10/6, she felt really good, but now she is feeling tired again, but not as much. While on phone, BP 134/83, P 119 (she had to walk to another room). She did a remote download of her pacer.

## 2022-03-31 ENCOUNTER — Telehealth: Payer: Self-pay

## 2022-03-31 NOTE — Telephone Encounter (Signed)
Left message on Tricia Gonzales's cell that patient can hold Pt for the time being. Spoke with paitne and told her the same. Patient noticeably sob while on phone and stated she does not feel well. Recommended she be taken to the ED. She said she didn't want to go. Offered to schedule an appointment with Dr. Lovena Le. She stated she would think about it and will call if she wants an appointment.

## 2022-03-31 NOTE — Telephone Encounter (Signed)
Okay to hold PT for the time being.  Please refer future questions to Dr. Lovena Le and primary Cardiologist

## 2022-03-31 NOTE — Telephone Encounter (Signed)
LMTCB.

## 2022-03-31 NOTE — Telephone Encounter (Signed)
Received call from [place], [place]. Informed her that I spoke with patient yesterday and of my notes. Katha Hamming stated she has worked with patient for 15 years with [facility] and noted a steady decline in the patient's physical abilities with normal life skills. She wants to know if PT should be deferred until patient is stable. Please advise.

## 2022-03-31 NOTE — Telephone Encounter (Signed)
Unfortunately, it does appear that the atrial tachycardia is back at 122 bpm. This probably explains why she is feeling poorly again. I believe Dr. Lovena Le was working on getting her an appointment.

## 2022-03-31 NOTE — Telephone Encounter (Signed)
Tricia Gonzales returned call.

## 2022-04-01 NOTE — Telephone Encounter (Signed)
Spoke with Tricia Gonzales, the SOB and elevated comes and goes. Right now she feels okay. Her swelling is different by her report, she has not lost the fluid as she usually does when she doubles her fluid pills. She reports she will be fine until her appointment with [name].

## 2022-04-02 ENCOUNTER — Ambulatory Visit: Payer: Medicare Other | Admitting: Rheumatology

## 2022-04-03 ENCOUNTER — Telehealth: Payer: Self-pay | Admitting: Physician Assistant

## 2022-04-03 ENCOUNTER — Telehealth: Payer: Self-pay

## 2022-04-03 ENCOUNTER — Ambulatory Visit (INDEPENDENT_AMBULATORY_CARE_PROVIDER_SITE_OTHER): Payer: Medicare Other

## 2022-04-03 DIAGNOSIS — I495 Sick sinus syndrome: Secondary | ICD-10-CM | POA: Diagnosis not present

## 2022-04-03 LAB — CUP PACEART REMOTE DEVICE CHECK
Battery Remaining Longevity: 24 mo
Battery Voltage: 2.94 V
Brady Statistic AP VP Percent: 83.36 %
Brady Statistic AP VS Percent: 0.01 %
Brady Statistic AS VP Percent: 16.62 %
Brady Statistic AS VS Percent: 0.01 %
Brady Statistic RA Percent Paced: 82.63 %
Brady Statistic RV Percent Paced: 99.95 %
Date Time Interrogation Session: 20231013141524
Implantable Lead Implant Date: 20161227
Implantable Lead Implant Date: 20161227
Implantable Lead Location: 753859
Implantable Lead Location: 753860
Implantable Lead Model: 5076
Implantable Lead Model: 5076
Implantable Pulse Generator Implant Date: 20161227
Lead Channel Impedance Value: 285 Ohm
Lead Channel Impedance Value: 304 Ohm
Lead Channel Impedance Value: 342 Ohm
Lead Channel Impedance Value: 380 Ohm
Lead Channel Pacing Threshold Amplitude: 0.5 V
Lead Channel Pacing Threshold Amplitude: 1 V
Lead Channel Pacing Threshold Pulse Width: 0.4 ms
Lead Channel Pacing Threshold Pulse Width: 0.4 ms
Lead Channel Sensing Intrinsic Amplitude: 2.375 mV
Lead Channel Sensing Intrinsic Amplitude: 2.375 mV
Lead Channel Sensing Intrinsic Amplitude: 22.625 mV
Lead Channel Sensing Intrinsic Amplitude: 22.625 mV
Lead Channel Setting Pacing Amplitude: 2 V
Lead Channel Setting Pacing Amplitude: 2.5 V
Lead Channel Setting Pacing Pulse Width: 0.4 ms
Lead Channel Setting Sensing Sensitivity: 2.8 mV

## 2022-04-03 LAB — BASIC METABOLIC PANEL
BUN/Creatinine Ratio: 26 (ref 12–28)
BUN: 54 mg/dL — ABNORMAL HIGH (ref 8–27)
CO2: 26 mmol/L (ref 20–29)
Calcium: 9.4 mg/dL (ref 8.7–10.3)
Chloride: 98 mmol/L (ref 96–106)
Creatinine, Ser: 2.05 mg/dL — ABNORMAL HIGH (ref 0.57–1.00)
Glucose: 118 mg/dL — ABNORMAL HIGH (ref 70–99)
Potassium: 4.6 mmol/L (ref 3.5–5.2)
Sodium: 144 mmol/L (ref 134–144)
eGFR: 24 mL/min/{1.73_m2} — ABNORMAL LOW (ref >59)

## 2022-04-03 LAB — PRO B NATRIURETIC PEPTIDE: NT-Pro BNP: 69331 pg/mL — ABNORMAL HIGH (ref 0–738)

## 2022-04-03 NOTE — Telephone Encounter (Signed)
-----  Message from Wanda Plump, RN sent at 03/27/2022  2:10 PM EDT ----- I scheduled her in [place], maybe it will come over automatically  ----- Message ----- From: Sanda Klein, MD Sent: 03/27/2022   1:20 PM EDT To: [facility]  This is a patient followed by Dr. Lovena Le who was seen at [facility] today and found to have persistent atrial tachycardia that we resolved with overdrive pacing.  Can we please do a download in 1 week to make sure she has not had recurrence? Thanks

## 2022-04-03 NOTE — Telephone Encounter (Signed)
Patient stated she did the manual transmission. Gave patient lab results per Janan Ridge, PA: "Stable renal function and electrolyte. Unfortunately, proBNP is continuing to rise. Move the next appt up to an earlier visit. Please make it the last appt of morning or last appt of the afternoon in case she ended up needing admission."   Working to move her appointment closer.

## 2022-04-03 NOTE — Telephone Encounter (Signed)
Called patient to send manual transmission. No answer, LMTCB.   Patients monitor is not automatic so will need manual send.

## 2022-04-03 NOTE — Telephone Encounter (Signed)
Patient informed she has an appointment with H.Meng, [facility] on Wednesday 04/08/22 at 3:35 pm. Recommended she pack a bag in case she is admitted to the hospital.

## 2022-04-03 NOTE — Telephone Encounter (Signed)
Patient is calling stating she is returning a call she received from Judson Roch regarding her lab results. Please advise.

## 2022-04-05 NOTE — Telephone Encounter (Signed)
Not surprising. We will try and see her this week. GT

## 2022-04-07 ENCOUNTER — Inpatient Hospital Stay: Payer: Medicare Other

## 2022-04-08 ENCOUNTER — Encounter: Payer: Self-pay | Admitting: Physician Assistant

## 2022-04-08 ENCOUNTER — Ambulatory Visit: Payer: Medicare Other | Attending: Physician Assistant | Admitting: Physician Assistant

## 2022-04-08 VITALS — BP 132/74 | HR 114 | Ht <= 58 in | Wt 191.4 lb

## 2022-04-08 DIAGNOSIS — I480 Paroxysmal atrial fibrillation: Secondary | ICD-10-CM | POA: Diagnosis not present

## 2022-04-08 DIAGNOSIS — N184 Chronic kidney disease, stage 4 (severe): Secondary | ICD-10-CM

## 2022-04-08 DIAGNOSIS — R0602 Shortness of breath: Secondary | ICD-10-CM | POA: Diagnosis not present

## 2022-04-08 DIAGNOSIS — E785 Hyperlipidemia, unspecified: Secondary | ICD-10-CM

## 2022-04-08 DIAGNOSIS — I4719 Other supraventricular tachycardia: Secondary | ICD-10-CM

## 2022-04-08 DIAGNOSIS — E119 Type 2 diabetes mellitus without complications: Secondary | ICD-10-CM

## 2022-04-08 DIAGNOSIS — I10 Essential (primary) hypertension: Secondary | ICD-10-CM

## 2022-04-08 DIAGNOSIS — I5041 Acute combined systolic (congestive) and diastolic (congestive) heart failure: Secondary | ICD-10-CM

## 2022-04-08 DIAGNOSIS — I340 Nonrheumatic mitral (valve) insufficiency: Secondary | ICD-10-CM

## 2022-04-08 MED ORDER — CARVEDILOL 12.5 MG PO TABS: 12.5000 mg | ORAL_TABLET | Freq: Two times a day (BID) | ORAL | 3 refills | Status: DC

## 2022-04-08 NOTE — Patient Instructions (Addendum)
Medication Instructions:  INCREASE Carvedilol (Coreg) to 12.5 mg 2 times a day   *If you need a refill on your cardiac medications before your next appointment, please call your pharmacy*  Lab Work: Almyra Deforest, PA-C recommends that you return for lab work 1-2 days prior to your appointment with Dr. Lovena Le:   BMP If you have labs (blood work) drawn today and your tests are completely normal, you will receive your results only by: [place] (if you have MyChart) OR A paper copy in the mail If you have any lab test that is abnormal or we need to change your treatment, we will call you to review the results.  Testing/Procedures: Push upcoming previously scheduled Echo to 4 weeks   Follow-Up: At [facility], you and your health needs are our priority.  As part of our continuing mission to provide you with exceptional heart care, we have created designated Provider Care Teams.  These Care Teams include your primary Cardiologist (physician) and Advanced Practice Providers (APPs -  Physician Assistants and Nurse Practitioners) who all work together to provide you with the care you need, when you need it.  We recommend signing up for the patient portal called "MyChart".  Sign up information is provided on this After Visit Summary.  MyChart is used to connect with patients for Virtual Visits (Telemedicine).  Patients are able to view lab/test results, encounter notes, upcoming appointments, etc.  Non-urgent messages can be sent to your provider as well.   To learn more about what you can do with MyChart, go to [email or web address].    Your next appointment:   1 week after Echocardiogram    The format for your next appointment:   In Person  Provider:   Almyra Deforest, PA-C        Other Instructions  Important Information About Sugar

## 2022-04-08 NOTE — Progress Notes (Signed)
Cardiology Office Note:    Date:  04/10/2022   ID:  KITTY CADAVID, DOB [birth year], MRN [id number]  PCP:  Carol Ada, [place] [facility] Cardiologist:  Larae Grooms, MD Electrophysiologist:  Cristopher Peru, MD     Referring MD: Carol Ada, MD   Chief Complaint  Patient presents with   Follow-up    History of Present Illness:    Tricia Gonzales is a 81 y.o. female with a hx of PAF, HFpEF, HTN, HLD, DM2, and CKD stage IV.  She has been followed by nephrology service.  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  Patient was previously diagnosed with PAF and underwent DCCV on 07/31/2020.  Limited echocardiogram obtained at the time showed EF 50 to 55%, moderately elevated pulmonary artery systolic pressure, moderate to severe MR.  She has a history of GI bleed and Sjogren's.  Patient was last seen by Dr. Audie Box on 02/06/2022 at which time she was maintaining sinus rhythm and has been compliant with Eliquis 2.5 mg twice a day.  Due to elevated blood pressure, carvedilol was increased to 6.25 mg twice a day.  28-monthfollow-up was recommended.   I saw the patient on 03/17/2022 for evaluation of dyspnea on exertion that has been going on for several month.  She says her symptom has been going on since her cardiac medication was adjusted including increased carvedilol and lowering of Eliquis.  I told the patient that medication adjustment is unlikely to have caused her symptom.  I suspected her shortness of breath was caused by worsening valve issue and recommended echocardiogram.  We did a remote interrogation, she is maintaining sinus rhythm and only had 1 minute of A-fib a month ago.  I asked her to increase torsemide to 20 mg twice a day for 3 days before going down to the previous dose, however she was hesitant to do so given her poor renal function.  Lab work showed her proBNP was 65,000.  Creatinine was 2.16.  Subsequent  echocardiogram obtained on 03/20/2022 showed EF 25 to 30%, moderately enlarged RV with RV systolic pressure 480.0mmHg, moderately dilated left and the right atrium, moderate to severe MR was significant artifact and eccentric jet posteriorly directed, may be underestimated, moderate MAC, severe TR.   I saw the patient on 07/28/2021, she continued to have dyspnea on exertion.  She spoke with her nephrologist who is okay with her increasing the diuretic.  I increased her torsemide to 40 mg daily.  I initially suspected her EF dropped due to worsening mitral regurgitation.  Unfortunately because her renal function, she is not a very ideal candidate for left and right heart cath.  She denies any recent chest pain.  The case was discussed with DOD Dr. CSallyanne Kusterwho interrogated her pacemaker as her EKG showed she was in the atrial tachycardia which was different from her previous rhythm a week prior.  She was able to be paced out of fast atrial tachycardia episode. Dr. CSallyanne Kustersuspected the patient will need antiarrhythmic therapy, however she had nausea associated with amiodarone in the past. Dr. CSallyanne Kusterrecommended earlier follow-up with Dr. TLovena Leto discuss other treatment for her frequent atrial tachycardic episode.  He also recommended a repeat echocardiogram at least 4 weeks out after her heart rate is better managed in order to follow-up on her ejection fraction.  I ordered a basic metabolic panel and a proBNP again.  Basic metabolic panel  shows her renal function is stable after starting on the diuretic, however her proBNP actually increased instead of decreasing.  Therefore I recommended a earlier follow-up.  Patient presents today for follow-up.  Her weight is 4 pounds lower.  EKG shows she is back in tachycardic rhythm, unclear if A-fib versus atrial tachycardia.  It appears to be irregular.  Her lower extremity edema has improved.  We will continue on the current dose of diuretic.  Fortunately since her  overall condition is improving, I do not think she need to be admitted.  The case was discussed with DOD Dr. Debara Pickett, it is unclear to [place] why her proBNP is this high.  She says Dr. Tanna Furry office tried to contact her yesterday and offer her a earlier visit, however she turned it down.  I would prefer her to be seen early if there is a earlier appointment available.  If not, she can follow-up with Dr. Lovena Le next Thursday.  I also recommended a repeat basic metabolic panel 1 to 2 days before she see Dr. Lovena Le.  I will push the echocardiogram back for another 4 weeks as her heart rate is still not very well controlled and I do not think her EF has changed.  Hopefully Dr. Lovena Le can recommend antiarrhythmic therapy to help control her tachycardic episodes.  I will increase her carvedilol to 12.5 mg twice a day.  I will see her back a week after her repeat echocardiogram.  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   Anxiety    Arthritis    Atrial fibrillation (HCC)    Avascular necrosis (HCC) hip left   leg pain also   Chronic back pain    Chronic kidney disease (CKD), stage III (moderate) (HCC)    Depression    Diverticulosis    Early cataracts, bilateral    Esophageal stricture    Gastritis    GAVE (gastric antral vascular ectasia)    GERD (gastroesophageal reflux disease)    Glaucoma    Heart murmur     " some doctors say that I have one some say that I don"t "   Hiatal hernia    History of blood transfusion    "@ least w/1st knee OR"   History of gout    History of kidney stones    Hyperlipidemia    Hypertension    Intestinal obstruction ([place])    Kidney stones    Obesity    Osteoarthritis    Osteoporosis    Pericarditis 2016   Presence of permanent cardiac pacemaker    Sjogren's disease ([place])    Thyroid disease    Type II diabetes mellitus ([place])    Vitamin B12 deficiency     Past Surgical History:  Procedure Laterality  Date   ABDOMINAL HYSTERECTOMY     complete   CARDIOVERSION N/A 07/31/2020   Procedure: CARDIOVERSION;  Surgeon: Werner Lean, MD;  Location: [facility];  Service: Cardiovascular;  Laterality: N/A;   CHOLECYSTECTOMY OPEN     COLECTOMY     for rectovaginal fistula   COLONOSCOPY  01/07/2012   Procedure: COLONOSCOPY;  Surgeon: Lafayette Dragon, MD;  Location: [facility];  Service: Endoscopy;  Laterality: N/A;   EP IMPLANTABLE DEVICE N/A 06/18/2015   Procedure: Pacemaker Implant;  Surgeon: Evans Lance, MD;  Location: [facility];  Service: Cardiovascular;  Laterality: N/A;   ESOPHAGEAL DILATION  08/17/2020  Procedure: ESOPHAGEAL DILATION;  Surgeon: Lavena Bullion, DO;  Location: [facility];  Service: Gastroenterology;;   ESOPHAGOGASTRODUODENOSCOPY N/A 11/24/2018   Procedure: ESOPHAGOGASTRODUODENOSCOPY (EGD);  Surgeon: Yetta Flock, MD;  Location: [facility];  Service: Gastroenterology;  Laterality: N/A;   ESOPHAGOGASTRODUODENOSCOPY (EGD) WITH ESOPHAGEAL DILATION     ESOPHAGOGASTRODUODENOSCOPY (EGD) WITH PROPOFOL N/A 08/12/2015   Procedure: ESOPHAGOGASTRODUODENOSCOPY (EGD) WITH PROPOFOL;  Surgeon: Manus Gunning, MD;  Location: [facility];  Service: Gastroenterology;  Laterality: N/A;   ESOPHAGOGASTRODUODENOSCOPY (EGD) WITH PROPOFOL N/A 08/17/2020   Procedure: ESOPHAGOGASTRODUODENOSCOPY (EGD) WITH PROPOFOL;  Surgeon: Lavena Bullion, DO;  Location: [facility];  Service: Gastroenterology;  Laterality: N/A;   EYE SURGERY Bilateral    cataracts   HOT HEMOSTASIS N/A 11/24/2018   Procedure: HOT HEMOSTASIS (ARGON PLASMA COAGULATION/BICAP);  Surgeon: Yetta Flock, MD;  Location: [facility];  Service: Gastroenterology;  Laterality: N/A;   HOT HEMOSTASIS N/A 08/17/2020   Procedure: HOT HEMOSTASIS (ARGON PLASMA COAGULATION/BICAP);  Surgeon: Lavena Bullion, DO;  Location: [facility];  Service: Gastroenterology;  Laterality: N/A;   I & D EXTREMITY  Right 08/06/2016   Procedure: DEBRIDEMENT PIP RIGHT RING FINGER;  Surgeon: Daryll Brod, MD;  Location: [facility];  Service: Orthopedics;  Laterality: Right;   INSERT / REPLACE / REMOVE PACEMAKER     JOINT REPLACEMENT     KNEE ARTHROSCOPY Right    MASS EXCISION Right 08/06/2016   Procedure: EXCISION CYST;  Surgeon: Daryll Brod, MD;  Location: [facility];  Service: Orthopedics;  Laterality: Right;   REVISION TOTAL KNEE ARTHROPLASTY Left    TOTAL KNEE ARTHROPLASTY Bilateral     Current Medications: Current Meds  Medication Sig   ACCU-CHEK AVIVA PLUS test strip 1 each by Other route daily.    acetaminophen (TYLENOL) 650 MG CR tablet Take 1,300 mg by mouth every evening.   BD PEN NEEDLE NANO U/F 32G X 4 MM MISC See admin instructions.   Cholecalciferol (VITAMIN D3) 25 MCG (1000 UT) CAPS Take 1,000 Units by mouth daily.   cycloSPORINE (RESTASIS) 0.05 % ophthalmic emulsion Place 1 drop into both eyes 2 (two) times daily.   dicyclomine (BENTYL) 10 MG capsule Take 1 capsule (10 mg total) by mouth every 8 (eight) hours as needed for spasms.   ELIQUIS 2.5 MG TABS tablet TAKE ONE TABLET TWICE DAILY   febuxostat (ULORIC) 40 MG tablet Take 1 tablet (40 mg total) by mouth every Monday, Wednesday, and Friday.   ferrous sulfate 325 (65 FE) MG EC tablet Take 1 tablet (325 mg total) by mouth 2 (two) times daily.   gabapentin (NEURONTIN) 400 MG capsule Take 1 capsule (400 mg total) by mouth at bedtime.   levothyroxine (SYNTHROID) 75 MCG tablet Take 75 mcg by mouth every morning.   lidocaine (LIDODERM) 5 % Place 1-3 patches onto the skin See admin instructions. Apply 1-3 patches transdermally every 24 hours as needed for pain and remove & discard patches within 12 hours or as directed by MD   montelukast (SINGULAIR) 10 MG tablet Take 1 tablet (10 mg total) by mouth at bedtime.   nystatin (NYAMYC) powder APPLY TOPICALLY TO LEFT ABDOMINAL FOLD AND GROIN TWICE A DAY   ondansetron (ZOFRAN-ODT) 4 MG disintegrating tablet  Take 1 tablet (4 mg total) by mouth every 8 (eight) hours as needed for nausea or vomiting.   pantoprazole (PROTONIX) 40 MG tablet Take 40 mg by mouth daily.   pentoxifylline (TRENTAL) 400 MG CR tablet TAKE ONE TABLET EACH DAY WITH A  MEAL   Polyethylene Glycol 3350 (MIRALAX PO) Take 17 g by mouth in the morning and at bedtime.   rOPINIRole (REQUIP) 0.5 MG tablet Take 0.5 mg by mouth at bedtime.   rosuvastatin (CRESTOR) 5 MG tablet Take 1 tablet (5 mg total) by mouth daily.   sennosides-docusate sodium (SENOKOT-S) 8.6-50 MG tablet 1 tablet   SF 5000 PLUS 1.1 % CREA dental cream Take by mouth as directed.   SIMBRINZA 1-0.2 % SUSP Apply 1 drop to eye 2 (two) times daily.   torsemide (DEMADEX) 20 MG tablet Take 2 tablets (40 mg total) by mouth daily.   traMADol (ULTRAM) 50 MG tablet Take 50 mg by mouth See admin instructions. Take 50 mg by mouth at bedtime and an additional 50 mg once a day as needed for pain   TRESIBA FLEXTOUCH 100 UNIT/ML FlexTouch Pen Inject 10 Units into the skin every evening.   [DISCONTINUED] carvedilol (COREG) 6.25 MG tablet Take 1 tablet by mouth twice daily     Allergies:   Norvasc [amlodipine besylate], Other, Amlodipine besylate, Avapro [irbesartan], Glucophage [metformin hydrochloride], Lisinopril, Losartan, Morphine and related, Simvastatin, and Trulicity [dulaglutide]   Social History   Socioeconomic History   Marital status: Divorced    Spouse name: Not on file   Number of children: 2   Years of education: Not on file   Highest education level: Not on file  Occupational History   Occupation: retired  Tobacco Use   Smoking status: Former    Packs/day: 2.00    Years: 3.00    Total pack years: 6.00    Types: Cigarettes    Quit date: 06/23/1983    Years since quitting: 38.8    Passive exposure: Never   Smokeless tobacco: Never  Vaping Use   Vaping Use: Never used  Substance and Sexual Activity   Alcohol use: Yes    Alcohol/week: 2.0 standard drinks of  alcohol    Types: 2 Glasses of wine per week    Comment: occ   Drug use: No   Sexual activity: Not Currently  Other Topics Concern   Not on file  Social History Narrative   Not on file   Social Determinants of Health   Financial Resource Strain: Not on file  Food Insecurity: Not on file  Transportation Needs: Not on file  Physical Activity: Not on file  Stress: Not on file  Social Connections: Not on file     Family History: The patient's family history includes Diabetes in her brother and mother; Hypertension in her sister. There is no history of Colon cancer, Heart attack, Stroke, Colon polyps, Esophageal cancer, Rectal cancer, or Stomach cancer.  ROS:   Please see the history of present illness.     All other systems reviewed and are negative.  EKGs/Labs/Other Studies Reviewed:    The following studies were reviewed today:  Echo 03/20/2022 1. Left ventricular ejection fraction, by estimation, is 25 to 30%. The  left ventricle has severely decreased function. The left ventricle  demonstrates global hypokinesis. There is mild left ventricular  hypertrophy. Left ventricular diastolic parameters   are indeterminate.   2. Right ventricular systolic function is moderately reduced. The right  ventricular size is moderately enlarged. There is mildly elevated  pulmonary artery systolic pressure. The estimated right ventricular  systolic pressure is 96.7 mmHg.   3. Left atrial size was moderately dilated.   4. Right atrial size was mild to moderately dilated.   5. The mitral valve is  degenerative. Moderate to severe mitral valve  regurgitation with significant splay artifact and eccentric jet  posteriorly directed, may be underestimated. Moderate MAC. No evidence of  mitral stenosis.   6. Tricuspid valve regurgitation is severe.   7. The aortic valve is tricuspid. There is mild calcification of the  aortic valve. There is moderate thickening of the aortic valve. Aortic   valve regurgitation is trivial. No aortic stenosis is present.   8. The inferior vena cava is dilated in size with <50% respiratory  variability, suggesting right atrial pressure of 15 mmHg.   Comparison(s): A prior study was performed on 08/16/20. Changes from prior  study are noted.   EKG:  EKG is ordered today.  The ekg ordered today demonstrates atrial fibrillation with RVR versus atrial tachycardia.  Recent Labs: 02/24/2022: Hemoglobin 10.6; Platelet Count 193 04/02/2022: BUN 54; Creatinine, Ser 2.05; NT-Pro BNP 69,331; Potassium 4.6; Sodium 144  Recent Lipid Panel No results found for: "CHOL", "TRIG", "HDL", "CHOLHDL", "VLDL", "LDLCALC", "LDLDIRECT"   Risk Assessment/Calculations:    CHA2DS2-VASc Score = 5   This indicates a 7.2% annual risk of stroke. The patient's score is based upon: CHF History: 0 HTN History: 1 Diabetes History: 1 Stroke History: 0 Vascular Disease History: 0 Age Score: 2 Gender Score: 1          Physical Exam:    VS:  BP 132/74 (BP Location: Left Arm, Patient Position: Sitting, Cuff Size: Large)   Pulse (!) 114   Ht _0  (1.473 m)   Wt 191 lb 6.4 oz (86.8 kg)   SpO2 94%   BMI 40.00 kg/m        Wt Readings from Last 3 Encounters:  04/08/22 191 lb 6.4 oz (86.8 kg)  03/27/22 195 lb (88.5 kg)  03/17/22 195 lb 12.8 oz (88.8 kg)     GEN:  Well nourished, well developed in no acute distress HEENT: Normal NECK: No JVD; No carotid bruits LYMPHATICS: No lymphadenopathy CARDIAC: RRR, no murmurs, rubs, gallops RESPIRATORY:  Clear to auscultation without rales, wheezing or rhonchi  ABDOMEN: Soft, non-tender, non-distended MUSCULOSKELETAL:  No edema; No deformity  SKIN: Warm and dry NEUROLOGIC:  Alert and oriented x 3 PSYCHIATRIC:  Normal affect   ASSESSMENT:    1. SOB (shortness of breath)   2. Atrial tachycardia   3. Chronic kidney disease (CKD), stage IV (severe) ([place])   4. PAF (paroxysmal atrial fibrillation) ([place])   5.  Essential hypertension   6. Hyperlipidemia LDL goal <70   7. Controlled type 2 diabetes mellitus without complication, without long-term current use of insulin ([place])   8. Mitral valve insufficiency, unspecified etiology    PLAN:    In order of problems listed above:  Shortness of breath: Her shortness of breath has improved despite worsening proBNP.  Her most recent proBNP went up to 69,000.  Her weight is down by 4 pounds.  She is breathing better.  We will continue on the torsemide.  She will need a repeat basic metabolic panel  Atrial tachycardia: Although initially my suspicion was his mitral valve was getting worse contributing to a drop in the ejection fraction, however Dr. Sallyanne Kuster who saw the patient last time suspected her EF dropped because she was having frequent episode of atrial tachycardia.  Dr. Sallyanne Kuster interrogated her device and the paced her out of the fast rhythm during the last office visit.  EKG today shows his heart rate went up again, it is difficult to tell if the  underlying rhythm today is atrial tachycardia versus A-fib, it does look slightly irregular.  We have previously asked the patient to see Dr. Lovena Le earlier to see if there is any antiarrhythmic therapy she can use to try to control her heart rate better.  Unfortunately she says she has nausea associated with amiodarone in the past.  CKD stage IV: She is not a candidate for left and right heart cath despite at least moderate to severe MR.  We will need to monitor renal function closely after torsemide was increased to 40 mg daily  PAF: Recent interrogation shows no significant A-fib, however today's EKG shows her heart rate has increased again and it is somewhat irregular  Hypertension: Blood pressure stable  Hyperlipidemia: On Crestor  DM2: Managed by primary care provider  Moderate to severe MR: Recent echocardiogram shows moderate to severe MR, however it also mention that this likely has been  underestimated.  Acute systolic and diastolic heart failure: Recent echocardiogram shows EF has drastically dropped.  Initially I suspected it was due to worsening mitral valve, however Dr. Sallyanne Kuster felt it is because she is going in and out of frequent atrial tachycardia.  Dr. Sallyanne Kuster was able to pace her out of atrial tachycardia last time during interrogation of her device.  However, will need EP service to help with antiarrhythmic therapy.  We increased her carvedilol further today to 12.5 mg twice a day.           Medication Adjustments/Labs and Tests Ordered: Current medicines are reviewed at length with the patient today.  Concerns regarding medicines are outlined above.  Orders Placed This Encounter  Procedures   Basic metabolic panel   EKG [id number]   Meds ordered this encounter  Medications   carvedilol (COREG) 12.5 MG tablet    Sig: Take 1 tablet (12.5 mg total) by mouth 2 (two) times daily with a meal. Take 1 tablet by mouth twice daily    Dispense:  180 tablet    Refill:  3    Dose change new Rx    Patient Instructions  Medication Instructions:  INCREASE Carvedilol (Coreg) to 12.5 mg 2 times a day   *If you need a refill on your cardiac medications before your next appointment, please call your pharmacy*  Lab Work: Almyra Deforest, PA-C recommends that you return for lab work 1-2 days prior to your appointment with Dr. Lovena Le:   BMP If you have labs (blood work) drawn today and your tests are completely normal, you will receive your results only by: [place] (if you have MyChart) OR A paper copy in the mail If you have any lab test that is abnormal or we need to change your treatment, we will call you to review the results.  Testing/Procedures: Push upcoming previously scheduled Echo to 4 weeks   Follow-Up: At [facility], you and your health needs are our priority.  As part of our continuing mission to provide you with exceptional heart care, we have  created designated Provider Care Teams.  These Care Teams include your primary Cardiologist (physician) and Advanced Practice Providers (APPs -  Physician Assistants and Nurse Practitioners) who all work together to provide you with the care you need, when you need it.  We recommend signing up for the patient portal called "MyChart".  Sign up information is provided on this After Visit Summary.  MyChart is used to connect with patients for Virtual Visits (Telemedicine).  Patients are able to view lab/test results, encounter  notes, upcoming appointments, etc.  Non-urgent messages can be sent to your provider as well.   To learn more about what you can do with MyChart, go to [email or web address].    Your next appointment:   1 week after Echocardiogram    The format for your next appointment:   In Person  Provider:   Almyra Deforest, PA-C        Other Instructions  Important Information About Sugar         Signed, Almyra Deforest, [place]  04/10/2022 2:00 PM    [place]

## 2022-04-08 NOTE — Progress Notes (Signed)
Remote pacemaker transmission.

## 2022-04-13 ENCOUNTER — Ambulatory Visit: Payer: Medicare Other | Admitting: Physician Assistant

## 2022-04-13 ENCOUNTER — Other Ambulatory Visit (HOSPITAL_COMMUNITY): Payer: Medicare Other

## 2022-04-15 NOTE — Progress Notes (Deleted)
Office Visit Note  Patient: Tricia Gonzales             Date of Birth: [birth year]           MRN: [id number]             PCP: Carol Ada, MD Referring: Carol Ada, MD Visit Date: 04/28/2022 Occupation: _0 @  Subjective:  No chief complaint on file.   History of Present Illness: Tricia Gonzales is a 81 y.o. female ***   Activities of Daily Living:  Patient reports morning stiffness for *** {minute/hour:19697}.   Patient {ACTIONS;DENIES/REPORTS:21021675::"Denies"} nocturnal pain.  Difficulty dressing/grooming: {ACTIONS;DENIES/REPORTS:21021675::"Denies"} Difficulty climbing stairs: {ACTIONS;DENIES/REPORTS:21021675::"Denies"} Difficulty getting out of chair: {ACTIONS;DENIES/REPORTS:21021675::"Denies"} Difficulty using hands for taps, buttons, cutlery, and/or writing: {ACTIONS;DENIES/REPORTS:21021675::"Denies"}  No Rheumatology ROS completed.   PMFS History:  Patient Active Problem List   Diagnosis Date Noted  . Benign esophageal stricture   . Acute blood loss anemia   . Symptomatic anemia   . Acute diastolic heart failure ([place]) 08/15/2020  . AKI (acute kidney injury) ([place]) 08/15/2020  . CHF (congestive heart failure) ([place]) 08/15/2020  . Atrial flutter ([place])   . Sinus node dysfunction ([place]) 09/06/2019  . Status post left hip replacement 06/28/2019  . Urinary retention 05/12/2019  . Polyneuropathy associated with underlying disease ([place]) 05/12/2019  . Immobility 04/26/2019  . Low ferritin 04/24/2019  . Primary osteoarthritis of left hip 03/29/2019  . GAVE (gastric antral vascular ectasia)   . Avascular necrosis of bone of hip, left ([place]) 08/22/2018  . Vitamin B12 deficiency   . Type II diabetes mellitus ([place])   . Sjogren's disease ([place])   . Presence of permanent cardiac pacemaker   . Pericarditis   . Osteoarthritis   . Obesity   . Kidney stones   . Intestinal obstruction ([place])   . History of gout   . Hiatal hernia   . History of blood transfusion   . Heart  murmur   . GERD (gastroesophageal reflux disease)   . Gastritis   . Esophageal stricture   . Early cataracts, bilateral   . Diverticulosis   . Depression   . Chronic kidney disease (CKD), stage III (moderate) (HCC)   . Chronic back pain   . Atrial fibrillation ([place])   . Arthritis   . Anxiety   . Anemia   . Osteoarthritis of finger of right hand 01/22/2016  . Pain 01/22/2016  . Primary osteoarthritis of both first carpometacarpal joints 01/22/2016  . 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 Chest pain at rest   . Heme positive stool   . UTI (urinary tract infection) 08/09/2015  . Acute pericarditis   . Chest pain 08/06/2015  . Diabetes mellitus type 2, controlled ([place]) 08/06/2015  . Hyperlipidemia 08/06/2015  . Hypertension 08/06/2015  . CKD (chronic kidney disease) stage 3, GFR 30-59 ml/min (HCC) 08/06/2015  . Chronic anemia 08/06/2015  . Hyperkalemia   . Pain in the chest   . Pacemaker   . 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 Osteoporosis 03/22/2015  . Abdominal pain, left lower quadrant 01/07/2012  . Syncope 09/15/2011  . Junctional escape rhythm 09/15/2011  . PELVIC MASS 03/27/2009  . ABSCESS OF INTESTINE 02/19/2009  . VITAMIN B12 DEFICIENCY 11/21/2007  . OBESITY 11/21/2007  . ESOPHAGEAL STRICTURE 11/21/2007  . HIATAL HERNIA 11/21/2007  . UNSPECIFIED INTESTINAL OBSTRUCTION 11/21/2007  . DIVERTICULOSIS OF COLON 11/21/2007  . OSTEOARTHRITIS 11/21/2007  . DIABETES MELLITUS, HX OF 11/21/2007  . ANEMIA, HX OF 11/21/2007  .  HYPERTENSION, HX OF 11/21/2007  . REFLUX ESOPHAGITIS, HX OF 11/21/2007    Past Medical History:  Diagnosis Date  . Anemia   . 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 Anxiety   . Arthritis   . Atrial fibrillation ([place])   . Avascular necrosis (HCC) hip left   leg pain also  . Chronic back pain   . Chronic kidney disease (CKD), stage III (moderate) (HCC)   . Depression   . Diverticulosis   . Early cataracts, bilateral   .  Esophageal stricture   . Gastritis   . GAVE (gastric antral vascular ectasia)   . GERD (gastroesophageal reflux disease)   . Glaucoma   . Heart murmur     " some doctors say that I have one some say that I don"t "  . Hiatal hernia   . History of blood transfusion    "@ least w/1st knee OR"  . History of gout   . History of kidney stones   . Hyperlipidemia   . Hypertension   . Intestinal obstruction ([place])   . Kidney stones   . Obesity   . Osteoarthritis   . Osteoporosis   . Pericarditis 2016  . Presence of permanent cardiac pacemaker   . Sjogren's disease ([place])   . Thyroid disease   . Type II diabetes mellitus ([place])   . Vitamin B12 deficiency     Family History  Problem Relation Age of Onset  . Diabetes Mother   . Hypertension Sister   . Diabetes Brother   . Colon cancer Neg Hx   . Heart attack Neg Hx   . Stroke Neg Hx   . Colon polyps Neg Hx   . Esophageal cancer Neg Hx   . Rectal cancer Neg Hx   . Stomach cancer Neg Hx    Past Surgical History:  Procedure Laterality Date  . ABDOMINAL HYSTERECTOMY     complete  . CARDIOVERSION N/A 07/31/2020   Procedure: CARDIOVERSION;  Surgeon: Werner Lean, MD;  Location: [facility];  Service: Cardiovascular;  Laterality: N/A;  . CHOLECYSTECTOMY OPEN    . COLECTOMY     for rectovaginal fistula  . COLONOSCOPY  01/07/2012   Procedure: COLONOSCOPY;  Surgeon: Lafayette Dragon, MD;  Location: [facility];  Service: Endoscopy;  Laterality: N/A;  . EP IMPLANTABLE DEVICE N/A 06/18/2015   Procedure: Pacemaker Implant;  Surgeon: Evans Lance, MD;  Location: [facility];  Service: Cardiovascular;  Laterality: N/A;  . ESOPHAGEAL DILATION  08/17/2020   Procedure: ESOPHAGEAL DILATION;  Surgeon: Lavena Bullion, DO;  Location: [facility];  Service: Gastroenterology;;  . ESOPHAGOGASTRODUODENOSCOPY N/A 11/24/2018   Procedure: ESOPHAGOGASTRODUODENOSCOPY (EGD);  Surgeon: Yetta Flock, MD;  Location: [facility];   Service: Gastroenterology;  Laterality: N/A;  . ESOPHAGOGASTRODUODENOSCOPY (EGD) WITH ESOPHAGEAL DILATION    . ESOPHAGOGASTRODUODENOSCOPY (EGD) WITH PROPOFOL N/A 08/12/2015   Procedure: ESOPHAGOGASTRODUODENOSCOPY (EGD) WITH PROPOFOL;  Surgeon: Manus Gunning, MD;  Location: [facility];  Service: Gastroenterology;  Laterality: N/A;  . ESOPHAGOGASTRODUODENOSCOPY (EGD) WITH PROPOFOL N/A 08/17/2020   Procedure: ESOPHAGOGASTRODUODENOSCOPY (EGD) WITH PROPOFOL;  Surgeon: Lavena Bullion, DO;  Location: [facility];  Service: Gastroenterology;  Laterality: N/A;  . EYE SURGERY Bilateral    cataracts  . HOT HEMOSTASIS N/A 11/24/2018   Procedure: HOT HEMOSTASIS (ARGON PLASMA COAGULATION/BICAP);  Surgeon: Yetta Flock, MD;  Location: [facility];  Service: Gastroenterology;  Laterality: N/A;  . HOT HEMOSTASIS N/A 08/17/2020   Procedure: HOT HEMOSTASIS (  ARGON PLASMA COAGULATION/BICAP);  Surgeon: Lavena Bullion, DO;  Location: [facility]COPY;  Service: Gastroenterology;  Laterality: N/A;  . I & D EXTREMITY Right 08/06/2016   Procedure: DEBRIDEMENT PIP RIGHT RING FINGER;  Surgeon: Daryll Brod, MD;  Location: [facility];  Service: Orthopedics;  Laterality: Right;  . INSERT / REPLACE / REMOVE PACEMAKER    . JOINT REPLACEMENT    . KNEE ARTHROSCOPY Right   . MASS EXCISION Right 08/06/2016   Procedure: EXCISION CYST;  Surgeon: Daryll Brod, MD;  Location: [facility];  Service: Orthopedics;  Laterality: Right;  . REVISION TOTAL KNEE ARTHROPLASTY Left   . TOTAL KNEE ARTHROPLASTY Bilateral    Social History   Social History Narrative  . Not on file   Immunization History  Administered Date(s) Administered  . Influenza Split 03/27/2008, 04/26/2009, 04/07/2011  . Influenza, High Dose Seasonal PF 03/22/2012, 03/15/2014, 03/19/2015, 03/23/2016, 03/22/2017, 03/16/2018, 03/07/2019, 04/16/2020  . Influenza-Unspecified 03/23/2015, 04/05/2017  . PFIZER(Purple Top)SARS-COV-2 Vaccination 07/17/2019, 08/07/2019,  04/10/2020  . Pension scheme manager 7yr & up 05/07/2021  . Pneumococcal Conjugate-13 08/01/2014  . Pneumococcal Polysaccharide-23 03/27/2004, 03/22/2012  . Td 09/26/2007  . Tdap 12/28/2012  . Zoster Recombinat (Shingrix) 02/01/2018  . Zoster, Live 02/02/2012, 02/01/2018, 05/13/2018     Objective: Vital Signs: There were no vitals taken for this visit.   Physical Exam   Musculoskeletal Exam: ***  CDAI Exam: CDAI Score: -- Patient Global: --; Provider Global: -- Swollen: --; Tender: -- Joint Exam 04/28/2022   No joint exam has been documented for this visit   There is currently no information documented on the homunculus. Go to the Rheumatology activity and complete the homunculus joint exam.  Investigation: No additional findings.  Imaging: CUP PACEART REMOTE DEVICE CHECK  Result Date: 04/03/2022 Scheduled remote reviewed. Normal device function.  Next remote 91 days. [place]  ECHOCARDIOGRAM COMPLETE  Result Date: 03/22/2022    ECHOCARDIOGRAM REPORT   Patient Name:   JANGELIA HAZELLDate of Exam: 03/20/2022 Medical Rec #:  [id number]    Height:       58.0 in Accession #:    [id number]   Weight:       195.8 lb Date of Birth:  130-Jun-1942    BSA:          1.805 m Patient Age:    89years      BP:           138/78 mmHg Patient Gender: F             HR:           117 bpm. [facility], Cardiac Doppler and Color Doppler Indications:    R06.9 DOE  History:        Patient has prior history of Echocardiogram examinations, most                 recent 08/16/2020. CHF, Pacemaker, Arrythmias:Atrial Fibrillation                 and Atrial Flutter, Signs/Symptoms:Chest Pain and Syncope; Risk                 Factors:Hypertension, Diabetes and Dyslipidemia. Junctional                 escape rhythm. Junctional bradycardia. Sinus node dysfunction.                 Acute pericarditis.  Sonographer:  Diamond Nickel RCS Referring Phys: HAO MENG  IMPRESSIONS  1. Left ventricular ejection fraction, by estimation, is 25 to 30%. The left ventricle has severely decreased function. The left ventricle demonstrates global hypokinesis. There is mild left ventricular hypertrophy. Left ventricular diastolic parameters  are indeterminate.  2. Right ventricular systolic function is moderately reduced. The right ventricular size is moderately enlarged. There is mildly elevated pulmonary artery systolic pressure. The estimated right ventricular systolic pressure is 76.2 mmHg.  3. Left atrial size was moderately dilated.  4. Right atrial size was mild to moderately dilated.  5. The mitral valve is degenerative. Moderate to severe mitral valve regurgitation with significant splay artifact and eccentric jet posteriorly directed, may be underestimated. Moderate MAC. No evidence of mitral stenosis.  6. Tricuspid valve regurgitation is severe.  7. The aortic valve is tricuspid. There is mild calcification of the aortic valve. There is moderate thickening of the aortic valve. Aortic valve regurgitation is trivial. No aortic stenosis is present.  8. The inferior vena cava is dilated in size with <50% respiratory variability, suggesting right atrial pressure of 15 mmHg. Comparison(s): A prior study was performed on 08/16/20. Changes from prior study are noted. FINDINGS  Left Ventricle: Left ventricular ejection fraction, by estimation, is 25 to 30%. The left ventricle has severely decreased function. The left ventricle demonstrates global hypokinesis. The left ventricular internal cavity size was normal in size. There is mild left ventricular hypertrophy. Abnormal (paradoxical) septal motion, consistent with RV pacemaker. Left ventricular diastolic parameters are indeterminate. Right Ventricle: The right ventricular size is moderately enlarged. No increase in right ventricular wall thickness. Right ventricular systolic function is moderately reduced. There is mildly elevated  pulmonary artery systolic pressure. The tricuspid regurgitant velocity is 2.59 m/s, and with an assumed right atrial pressure of 15 mmHg, the estimated right ventricular systolic pressure is 26.3 mmHg. Left Atrium: Left atrial size was moderately dilated. Right Atrium: Right atrial size was mild to moderately dilated. Pericardium: There is no evidence of pericardial effusion. Mitral Valve: The mitral valve is degenerative in appearance. Moderate mitral annular calcification. Moderate to severe mitral valve regurgitation. No evidence of mitral valve stenosis. Tricuspid Valve: The tricuspid valve is normal in structure. Tricuspid valve regurgitation is severe. No evidence of tricuspid stenosis. Aortic Valve: The aortic valve is tricuspid. There is mild calcification of the aortic valve. There is moderate thickening of the aortic valve. Aortic valve regurgitation is trivial. No aortic stenosis is present. Pulmonic Valve: The pulmonic valve was normal in structure. Pulmonic valve regurgitation is mild. No evidence of pulmonic stenosis. Aorta: The aortic root is normal in size and structure. Venous: The inferior vena cava is dilated in size with less than 50% respiratory variability, suggesting right atrial pressure of 15 mmHg. IAS/Shunts: No atrial level shunt detected by color flow Doppler. Additional Comments: A device lead is visualized in the right atrium and right ventricle.  LEFT VENTRICLE PLAX 2D LVIDd:         5.40 cm LVIDs:         4.10 cm LV PW:         1.20 cm LV IVS:        1.20 cm LVOT diam:     1.60 cm LV SV:         23 LV SV Index:   13 LVOT Area:     2.01 cm  RIGHT VENTRICLE RV Basal diam:  4.10 cm TAPSE (M-mode): 0.9 cm RVSP:  34.8 mmHg LEFT ATRIUM             Index        RIGHT ATRIUM           Index LA diam:        6.00 cm 3.32 cm/m   RA Pressure: 8.00 mmHg LA Vol (A2C):   79.2 ml 43.88 ml/m  RA Area:     21.30 cm LA Vol (A4C):   78.1 ml 43.27 ml/m  RA Volume:   59.20 ml  32.80 ml/m LA  Biplane Vol: 79.7 ml 44.16 ml/m  AORTIC VALVE             PULMONIC VALVE LVOT Vmax:   67.33 cm/s  PR End Diast Vel: 17.98 msec LVOT Vmean:  40.900 cm/s LVOT VTI:    0.115 m  AORTA Ao Root diam: 3.20 cm Ao Asc diam:  2.70 cm MR Peak grad: 106.5 mmHg  TRICUSPID VALVE MR Mean grad: 60.0 mmHg   TR Peak grad:   26.8 mmHg MR Vmax:      516.00 cm/s TR Vmax:        259.00 cm/s MR Vmean:     352.7 cm/s  Estimated RAP:  8.00 mmHg                           RVSP:           34.8 mmHg                            SHUNTS                           Systemic VTI:  0.12 m                           Systemic Diam: 1.60 cm Cherlynn Kaiser MD Electronically signed by Cherlynn Kaiser MD Signature Date/Time: 03/22/2022/9:39:04 PM    Final     Recent Labs: Lab Results  Component Value Date   WBC 6.2 02/24/2022   HGB 10.6 (L) 02/24/2022   PLT 193 02/24/2022   NA 144 04/02/2022   K 4.6 04/02/2022   CL 98 04/02/2022   CO2 26 04/02/2022   GLUCOSE 118 (H) 04/02/2022   BUN 54 (H) 04/02/2022   CREATININE 2.05 (H) 04/02/2022   BILITOT 0.6 11/12/2020   ALKPHOS 85 11/12/2020   AST 14 (L) 11/12/2020   ALT 9 11/12/2020   PROT 6.3 11/13/2021   ALBUMIN 4.0 01/13/2022   CALCIUM 9.4 04/02/2022   GFRAA 24 (L) 08/05/2020    Speciality Comments: No specialty comments available.  Procedures:  No procedures performed Allergies: Norvasc [amlodipine besylate], Other, Amlodipine besylate, Avapro [irbesartan], Glucophage [metformin hydrochloride], Lisinopril, Losartan, Morphine and related, Simvastatin, and Trulicity [dulaglutide]   Assessment / Plan:     Visit Diagnoses: No diagnosis found.  Orders: No orders of the defined types were placed in this encounter.  No orders of the defined types were placed in this encounter.   Face-to-face time spent with patient was *** minutes. Greater than 50% of time was spent in counseling and coordination of care.  Follow-Up Instructions: No follow-ups on file.   Earnestine Mealing,  CMA  Note - This record has been created using Editor, commissioning.  Chart creation errors have been sought, but may not always  have been located. Such creation errors do not reflect on  the standard of medical care.

## 2022-04-16 ENCOUNTER — Ambulatory Visit: Payer: Medicare Other | Attending: Internal Medicine | Admitting: Internal Medicine

## 2022-04-16 ENCOUNTER — Other Ambulatory Visit (HOSPITAL_COMMUNITY): Payer: Medicare Other

## 2022-04-16 ENCOUNTER — Encounter: Payer: Self-pay | Admitting: Internal Medicine

## 2022-04-16 VITALS — BP 152/98 | HR 75 | Ht <= 58 in | Wt 191.0 lb

## 2022-04-16 DIAGNOSIS — Z950 Presence of cardiac pacemaker: Secondary | ICD-10-CM | POA: Diagnosis not present

## 2022-04-16 DIAGNOSIS — I495 Sick sinus syndrome: Secondary | ICD-10-CM

## 2022-04-16 DIAGNOSIS — I4819 Other persistent atrial fibrillation: Secondary | ICD-10-CM | POA: Diagnosis not present

## 2022-04-16 LAB — BASIC METABOLIC PANEL
BUN/Creatinine Ratio: 33 — ABNORMAL HIGH (ref 12–28)
BUN: 61 mg/dL — ABNORMAL HIGH (ref 8–27)
CO2: 28 mmol/L (ref 20–29)
Calcium: 9.2 mg/dL (ref 8.7–10.3)
Chloride: 97 mmol/L (ref 96–106)
Creatinine, Ser: 1.85 mg/dL — ABNORMAL HIGH (ref 0.57–1.00)
Glucose: 118 mg/dL — ABNORMAL HIGH (ref 70–99)
Potassium: 5.1 mmol/L (ref 3.5–5.2)
Sodium: 139 mmol/L (ref 134–144)
eGFR: 27 mL/min/{1.73_m2} — ABNORMAL LOW (ref >59)

## 2022-04-16 LAB — CUP PACEART INCLINIC DEVICE CHECK
Battery Remaining Longevity: 18 mo
Brady Statistic RA Percent Paced: 83 %
Brady Statistic RV Percent Paced: 100 %
Date Time Interrogation Session: 20231026164528
Implantable Lead Connection Status: 753985
Implantable Lead Connection Status: 753985
Implantable Lead Implant Date: 20161227
Implantable Lead Implant Date: 20161227
Implantable Lead Location: 753859
Implantable Lead Location: 753860
Implantable Lead Model: 5076
Implantable Lead Model: 5076
Implantable Pulse Generator Implant Date: 20161227
Lead Channel Impedance Value: 380 Ohm
Lead Channel Impedance Value: 399 Ohm
Lead Channel Pacing Threshold Amplitude: 0.5 V
Lead Channel Pacing Threshold Amplitude: 1 V
Lead Channel Pacing Threshold Pulse Width: 0.4 ms
Lead Channel Pacing Threshold Pulse Width: 0.4 ms
Lead Channel Sensing Intrinsic Amplitude: 2.5 mV

## 2022-04-16 MED ORDER — CARVEDILOL 12.5 MG PO TABS: ORAL_TABLET | ORAL | 3 refills | Status: DC

## 2022-04-16 NOTE — Patient Instructions (Addendum)
Medication Instructions:  Your physician has recommended you make the following change in your medication: INCREASE CARVEDILOL TO 18.75 MG  YOU WILL TAKE ONE AND A HALF TABLETS ( 18.75 MG ) BY MOUTH TWO TIMES A DAY.   Oral Surgery letter given to patient.   Physical Therapy letter given to patient.   Lab Work: None ordered.  If you have labs (blood work) drawn today and your tests are completely normal, you will receive your results only by: [place] (if you have MyChart) OR A paper copy in the mail If you have any lab test that is abnormal or we need to change your treatment, we will call you to review the results.  Testing/Procedures: None ordered.  Follow-Up: Please schedule a follow up appointment with Dr. Cristopher Peru for December 2023.    Important Information About Sugar

## 2022-04-16 NOTE — Progress Notes (Signed)
HPI Tricia Gonzales is referred today by Almyra Deforest for SVT and worsening heart failure symptoms. The patient is a pleasant 81 yo woman with a h/o heart block who also has MR. She also has had incessant SVT. She has developed class 3 symptoms. She has had her coreg increased to 12.5 mg twice daily. She feels better in the past week. She has not had syncope. Her EF has decreased from 50% to 25%. She has moderate pulmonary HTN.  Allergies  Allergen Reactions   Norvasc [Amlodipine Besylate] Shortness Of Breath   Other Rash and Other (See Comments)    Pt voiced she is allergic to microfiber materials/ blankets/sheets!!   Amlodipine Besylate     Other reaction(s): SOB   Avapro [Irbesartan] Other (See Comments)    Headaches   Glucophage [Metformin Hydrochloride] Other (See Comments)    "increases creatinine" and diarrhea   Lisinopril     Headache    Losartan Other (See Comments)    Headaches    Morphine And Related Other (See Comments)    "Hallucinations"   Simvastatin Other (See Comments)    Body aches   Trulicity [Dulaglutide] Other (See Comments)    Severe mood swings     Current Outpatient Medications  Medication Sig Dispense Refill   ACCU-CHEK AVIVA PLUS test strip 1 each by Other route daily.      acetaminophen (TYLENOL) 650 MG CR tablet Take 1,300 mg by mouth every evening.     BD PEN NEEDLE NANO U/F 32G X 4 MM MISC See admin instructions.     carvedilol (COREG) 12.5 MG tablet Take 1 tablet (12.5 mg total) by mouth 2 (two) times daily with a meal. Take 1 tablet by mouth twice daily 180 tablet 3   Cholecalciferol (VITAMIN D3) 25 MCG (1000 UT) CAPS Take 1,000 Units by mouth daily.     cycloSPORINE (RESTASIS) 0.05 % ophthalmic emulsion Place 1 drop into both eyes 2 (two) times daily.     dicyclomine (BENTYL) 10 MG capsule Take 1 capsule (10 mg total) by mouth every 8 (eight) hours as needed for spasms. 30 capsule 3   ELIQUIS 2.5 MG TABS tablet TAKE ONE TABLET TWICE DAILY 180  tablet 1   febuxostat (ULORIC) 40 MG tablet Take 1 tablet (40 mg total) by mouth every Monday, Wednesday, and Friday. 12 tablet 0   ferrous sulfate 325 (65 FE) MG EC tablet Take 1 tablet (325 mg total) by mouth 2 (two) times daily. 60 tablet 3   gabapentin (NEURONTIN) 400 MG capsule Take 1 capsule (400 mg total) by mouth at bedtime. 30 capsule 0   levothyroxine (SYNTHROID) 75 MCG tablet Take 75 mcg by mouth every morning.     lidocaine (LIDODERM) 5 % Place 1-3 patches onto the skin See admin instructions. Apply 1-3 patches transdermally every 24 hours as needed for pain and remove & discard patches within 12 hours or as directed by MD     montelukast (SINGULAIR) 10 MG tablet Take 1 tablet (10 mg total) by mouth at bedtime. 30 tablet 0   nystatin (NYAMYC) powder APPLY TOPICALLY TO LEFT ABDOMINAL FOLD AND GROIN TWICE A DAY 15 g 0   ondansetron (ZOFRAN-ODT) 4 MG disintegrating tablet Take 1 tablet (4 mg total) by mouth every 8 (eight) hours as needed for nausea or vomiting. 30 tablet 1   pantoprazole (PROTONIX) 40 MG tablet Take 40 mg by mouth daily.     pentoxifylline (TRENTAL) 400 MG CR  tablet TAKE ONE TABLET EACH DAY WITH A MEAL     Polyethylene Glycol 3350 (MIRALAX PO) Take 17 g by mouth in the morning and at bedtime.     rOPINIRole (REQUIP) 0.5 MG tablet Take 0.5 mg by mouth at bedtime.     rosuvastatin (CRESTOR) 5 MG tablet Take 1 tablet (5 mg total) by mouth daily. 30 tablet 0   sennosides-docusate sodium (SENOKOT-S) 8.6-50 MG tablet 1 tablet     SF 5000 PLUS 1.1 % CREA dental cream Take by mouth as directed.     SIMBRINZA 1-0.2 % SUSP Apply 1 drop to eye 2 (two) times daily.     torsemide (DEMADEX) 20 MG tablet Take 2 tablets (40 mg total) by mouth daily. 90 tablet 3   traMADol (ULTRAM) 50 MG tablet Take 50 mg by mouth See admin instructions. Take 50 mg by mouth at bedtime and an additional 50 mg once a day as needed for pain     TRESIBA FLEXTOUCH 100 UNIT/ML FlexTouch Pen Inject 10 Units  into the skin every evening.     No current facility-administered medications for this visit.     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   Anxiety    Arthritis    Atrial fibrillation ([place])    Avascular necrosis (HCC) hip left   leg pain also   Chronic back pain    Chronic kidney disease (CKD), stage III (moderate) (HCC)    Depression    Diverticulosis    Early cataracts, bilateral    Esophageal stricture    Gastritis    GAVE (gastric antral vascular ectasia)    GERD (gastroesophageal reflux disease)    Glaucoma    Heart murmur     " some doctors say that I have one some say that I don"t "   Hiatal hernia    History of blood transfusion    "@ least w/1st knee OR"   History of gout    History of kidney stones    Hyperlipidemia    Hypertension    Intestinal obstruction ([place])    Kidney stones    Obesity    Osteoarthritis    Osteoporosis    Pericarditis 2016   Presence of permanent cardiac pacemaker    Sjogren's disease ([place])    Thyroid disease    Type II diabetes mellitus ([place])    Vitamin B12 deficiency     ROS:   All systems reviewed and negative except as noted in the HPI.   Past Surgical History:  Procedure Laterality Date   ABDOMINAL HYSTERECTOMY     complete   CARDIOVERSION N/A 07/31/2020   Procedure: CARDIOVERSION;  Surgeon: Werner Lean, MD;  Location: [facility];  Service: Cardiovascular;  Laterality: N/A;   CHOLECYSTECTOMY OPEN     COLECTOMY     for rectovaginal fistula   COLONOSCOPY  01/07/2012   Procedure: COLONOSCOPY;  Surgeon: Lafayette Dragon, MD;  Location: [facility];  Service: Endoscopy;  Laterality: N/A;   EP IMPLANTABLE DEVICE N/A 06/18/2015   Procedure: Pacemaker Implant;  Surgeon: Evans Lance, MD;  Location: [facility];  Service: Cardiovascular;  Laterality: N/A;   ESOPHAGEAL DILATION  08/17/2020   Procedure: ESOPHAGEAL DILATION;  Surgeon: Lavena Bullion,  DO;  Location: [facility];  Service: Gastroenterology;;   ESOPHAGOGASTRODUODENOSCOPY N/A 11/24/2018   Procedure: ESOPHAGOGASTRODUODENOSCOPY (EGD);  Surgeon: Yetta Flock, MD;  Location: [facility];  Service: Gastroenterology;  Laterality: N/A;   ESOPHAGOGASTRODUODENOSCOPY (EGD) WITH ESOPHAGEAL DILATION     ESOPHAGOGASTRODUODENOSCOPY (EGD) WITH PROPOFOL N/A 08/12/2015   Procedure: ESOPHAGOGASTRODUODENOSCOPY (EGD) WITH PROPOFOL;  Surgeon: Manus Gunning, MD;  Location: [place];  Service: Gastroenterology;  Laterality: N/A;   ESOPHAGOGASTRODUODENOSCOPY (EGD) WITH PROPOFOL N/A 08/17/2020   Procedure: ESOPHAGOGASTRODUODENOSCOPY (EGD) WITH PROPOFOL;  Surgeon: Lavena Bullion, DO;  Location: [facility];  Service: Gastroenterology;  Laterality: N/A;   EYE SURGERY Bilateral    cataracts   HOT HEMOSTASIS N/A 11/24/2018   Procedure: HOT HEMOSTASIS (ARGON PLASMA COAGULATION/BICAP);  Surgeon: Yetta Flock, MD;  Location: [facility];  Service: Gastroenterology;  Laterality: N/A;   HOT HEMOSTASIS N/A 08/17/2020   Procedure: HOT HEMOSTASIS (ARGON PLASMA COAGULATION/BICAP);  Surgeon: Lavena Bullion, DO;  Location: [facility] END[facility];  Service: Gastroenterology;  Laterality: N/A;   I & D EXTREMITY Right 08/06/2016   Procedure: DEBRIDEMENT PIP RIGHT RING FINGER;  Surgeon: Daryll Brod, MD;  Location: [place] [facility];  Service: Orthopedics;  Laterality: Right;   INSERT / REPLACE / REMOVE PACEMAKER     JOINT REPLACEMENT     KNEE ARTHROSCOPY Right    MASS EXCISION Right 08/06/2016   Procedure: EXCISION CYST;  Surgeon: Daryll Brod, MD;  Location: [place];  Service: Orthopedics;  Laterality: Right;   REVISION TOTAL KNEE ARTHROPLASTY Left    TOTAL KNEE ARTHROPLASTY Bilateral      Family History  Problem Relation Age of Onset   Diabetes Mother    Hypertension Sister    Diabetes Brother    Colon cancer Neg Hx    Heart attack Neg Hx    Stroke Neg Hx    Colon polyps Neg Hx    Esophageal cancer  Neg Hx    Rectal cancer Neg Hx    Stomach cancer Neg Hx      Social History   Socioeconomic History   Marital status: Divorced    Spouse name: Not on file   Number of children: 2   Years of education: Not on file   Highest education level: Not on file  Occupational History   Occupation: retired  Tobacco Use   Smoking status: Former    Packs/day: 2.00    Years: 3.00    Total pack years: 6.00    Types: Cigarettes    Quit date: 06/23/1983    Years since quitting: 38.8    Passive exposure: Never   Smokeless tobacco: Never  Vaping Use   Vaping Use: Never used  Substance and Sexual Activity   Alcohol use: Yes    Alcohol/week: 2.0 standard drinks of alcohol    Types: 2 Glasses of wine per week    Comment: occ   Drug use: No   Sexual activity: Not Currently  Other Topics Concern   Not on file  Social History Narrative   Not on file   Social Determinants of Health   Financial Resource Strain: Not on file  Food Insecurity: Not on file  Transportation Needs: Not on file  Physical Activity: Not on file  Stress: Not on file  Social Connections: Not on file  Intimate Partner Violence: Not on file     BP (!) 152/98   Pulse 75   Ht _0  (1.473 m)   Wt 191 lb (86.6 kg)   SpO2 96%   BMI 39.92 kg/m   Physical Exam:  Well appearing NAD HEENT: Unremarkable Neck:  No JVD, no thyromegally Lymphatics:  No adenopathy Back:  No  CVA tenderness Lungs:  Clear HEART:  Regular rate rhythm, no murmurs, no rubs, no clicks Abd:  soft, positive bowel sounds, no organomegally, no rebound, no guarding Ext:  2 plus pulses, no edema, no cyanosis, no clubbing Skin:  No rashes no nodules Neuro:  CN II through XII intact, motor grossly intact  DEVICE  Normal device function.  See PaceArt for details.   Assess/Plan:  SVT - her symptoms appear to be controlled. I will uptitrate her dose of coreg. Acute on chronic heart failure - her symptoms are class 3. She will continue her  current meds.  MR - hopefully this will improve once her HR is improved.  CHB - she has a QRS of 200 ms. We discussed a biv PM upgrade but will hold off for now.   Carleene Overlie Tashea Othman,MD

## 2022-04-16 NOTE — Progress Notes (Signed)
Kidney function improving. Electrolyte stable.

## 2022-04-17 ENCOUNTER — Ambulatory Visit: Payer: Medicare Other | Admitting: Physician Assistant

## 2022-04-27 ENCOUNTER — Ambulatory Visit: Payer: Medicare Other | Admitting: Physician Assistant

## 2022-04-28 ENCOUNTER — Ambulatory Visit: Payer: Medicare Other | Admitting: Physician Assistant

## 2022-04-28 ENCOUNTER — Ambulatory Visit (INDEPENDENT_AMBULATORY_CARE_PROVIDER_SITE_OTHER): Payer: Medicare Other

## 2022-04-28 DIAGNOSIS — K31819 Angiodysplasia of stomach and duodenum without bleeding: Secondary | ICD-10-CM

## 2022-04-28 DIAGNOSIS — M19041 Primary osteoarthritis, right hand: Secondary | ICD-10-CM

## 2022-04-28 DIAGNOSIS — K5790 Diverticulosis of intestine, part unspecified, without perforation or abscess without bleeding: Secondary | ICD-10-CM

## 2022-04-28 DIAGNOSIS — Z950 Presence of cardiac pacemaker: Secondary | ICD-10-CM | POA: Diagnosis not present

## 2022-04-28 DIAGNOSIS — I5031 Acute diastolic (congestive) heart failure: Secondary | ICD-10-CM

## 2022-04-28 DIAGNOSIS — K449 Diaphragmatic hernia without obstruction or gangrene: Secondary | ICD-10-CM

## 2022-04-28 DIAGNOSIS — K222 Esophageal obstruction: Secondary | ICD-10-CM

## 2022-04-28 DIAGNOSIS — Z96653 Presence of artificial knee joint, bilateral: Secondary | ICD-10-CM

## 2022-04-28 DIAGNOSIS — Z96642 Presence of left artificial hip joint: Secondary | ICD-10-CM

## 2022-04-28 DIAGNOSIS — I480 Paroxysmal atrial fibrillation: Secondary | ICD-10-CM

## 2022-04-28 DIAGNOSIS — F32A Depression, unspecified: Secondary | ICD-10-CM

## 2022-04-28 DIAGNOSIS — I495 Sick sinus syndrome: Secondary | ICD-10-CM

## 2022-04-28 DIAGNOSIS — M19072 Primary osteoarthritis, left ankle and foot: Secondary | ICD-10-CM

## 2022-04-28 DIAGNOSIS — N183 Chronic kidney disease, stage 3 unspecified: Secondary | ICD-10-CM

## 2022-04-28 DIAGNOSIS — Z794 Long term (current) use of insulin: Secondary | ICD-10-CM

## 2022-04-28 DIAGNOSIS — M810 Age-related osteoporosis without current pathological fracture: Secondary | ICD-10-CM

## 2022-04-28 DIAGNOSIS — Z8739 Personal history of other diseases of the musculoskeletal system and connective tissue: Secondary | ICD-10-CM

## 2022-04-28 DIAGNOSIS — M3509 Sicca syndrome with other organ involvement: Secondary | ICD-10-CM

## 2022-04-28 DIAGNOSIS — M87052 Idiopathic aseptic necrosis of left femur: Secondary | ICD-10-CM

## 2022-04-28 DIAGNOSIS — E538 Deficiency of other specified B group vitamins: Secondary | ICD-10-CM

## 2022-04-28 DIAGNOSIS — Z8719 Personal history of other diseases of the digestive system: Secondary | ICD-10-CM

## 2022-04-28 DIAGNOSIS — I10 Essential (primary) hypertension: Secondary | ICD-10-CM

## 2022-04-28 DIAGNOSIS — G63 Polyneuropathy in diseases classified elsewhere: Secondary | ICD-10-CM

## 2022-04-28 DIAGNOSIS — Z8669 Personal history of other diseases of the nervous system and sense organs: Secondary | ICD-10-CM

## 2022-04-28 DIAGNOSIS — M5136 Other intervertebral disc degeneration, lumbar region: Secondary | ICD-10-CM

## 2022-04-28 DIAGNOSIS — R778 Other specified abnormalities of plasma proteins: Secondary | ICD-10-CM

## 2022-04-29 LAB — CUP PACEART REMOTE DEVICE CHECK
Battery Remaining Longevity: 22 mo
Battery Voltage: 2.93 V
Brady Statistic AP VP Percent: 88.61 %
Brady Statistic AP VS Percent: 0.01 %
Brady Statistic AS VP Percent: 11.37 %
Brady Statistic AS VS Percent: 0.01 %
Brady Statistic RA Percent Paced: 88.58 %
Brady Statistic RV Percent Paced: 99.94 %
Date Time Interrogation Session: 20231107181020
Implantable Lead Connection Status: 753985
Implantable Lead Connection Status: 753985
Implantable Lead Implant Date: 20161227
Implantable Lead Implant Date: 20161227
Implantable Lead Location: 753859
Implantable Lead Location: 753860
Implantable Lead Model: 5076
Implantable Lead Model: 5076
Implantable Pulse Generator Implant Date: 20161227
Lead Channel Impedance Value: 304 Ohm
Lead Channel Impedance Value: 304 Ohm
Lead Channel Impedance Value: 342 Ohm
Lead Channel Impedance Value: 380 Ohm
Lead Channel Pacing Threshold Amplitude: 0.5 V
Lead Channel Pacing Threshold Amplitude: 1 V
Lead Channel Pacing Threshold Pulse Width: 0.4 ms
Lead Channel Pacing Threshold Pulse Width: 0.4 ms
Lead Channel Sensing Intrinsic Amplitude: 19.25 mV
Lead Channel Sensing Intrinsic Amplitude: 19.25 mV
Lead Channel Sensing Intrinsic Amplitude: 2.25 mV
Lead Channel Sensing Intrinsic Amplitude: 2.25 mV
Lead Channel Setting Pacing Amplitude: 2 V
Lead Channel Setting Pacing Amplitude: 2.5 V
Lead Channel Setting Pacing Pulse Width: 0.4 ms
Lead Channel Setting Sensing Sensitivity: 2.8 mV
Zone Setting Status: 755011

## 2022-05-04 ENCOUNTER — Telehealth: Payer: Self-pay

## 2022-05-04 NOTE — Telephone Encounter (Signed)
..  Pre-operative Risk Assessment    Patient Name: Tricia Gonzales  DOB: 28-Sep-1940 MRN: [id number]      Request for Surgical Clearance    Procedure:   endoscopy  Date of Surgery:  Clearance TBD                                 Surgeon:  Dr. Therisa Doyne Surgeon's Group or Practice Name:  [facility] Phone number:  [phone] Fax number:  [phone]   Type of Clearance Requested:   - Medical  - Pharmacy:  Hold Apixaban (Eliquis)     Type of Anesthesia:   propofol   Additional requests/questions:    Gwenlyn Found   05/04/2022, 2:12 PM

## 2022-05-05 NOTE — Telephone Encounter (Signed)
Patient with diagnosis of atrial fibriliation on Eliquis for anticoagulation.    Procedure: endoscopy  Date of procedure: TBD   CHA2DS2-VASc Score = 5   This indicates a 7.2% annual risk of stroke. The patient's score is based upon: CHF History: 0 HTN History: 1 Diabetes History: 1 Stroke History: 0 Vascular Disease History: 0 Age Score: 2 Gender Score: 1   CrCl 24 mL/min(SrCr 1.85 04/15/2022) Platelet count 193 (02/24/2022)  Per office protocol, patient can hold Eliquis for 1 days prior to procedure.   Patient will not need bridging with Lovenox (enoxaparin) around procedure.  **This guidance is not considered finalized until pre-operative APP has relayed final recommendations.**

## 2022-05-11 ENCOUNTER — Ambulatory Visit (HOSPITAL_COMMUNITY): Payer: Medicare Other | Attending: Cardiology

## 2022-05-11 DIAGNOSIS — I517 Cardiomegaly: Secondary | ICD-10-CM | POA: Insufficient documentation

## 2022-05-11 DIAGNOSIS — I340 Nonrheumatic mitral (valve) insufficiency: Secondary | ICD-10-CM | POA: Diagnosis not present

## 2022-05-11 DIAGNOSIS — I519 Heart disease, unspecified: Secondary | ICD-10-CM | POA: Diagnosis not present

## 2022-05-11 LAB — ECHOCARDIOGRAM LIMITED
Area-P 1/2: 4.6 cm2
MV M vel: 5.74 m/s
MV Peak grad: 131.8 mmHg
Radius: 0.8 cm
S' Lateral: 4.4 cm

## 2022-05-11 NOTE — Telephone Encounter (Signed)
     Primary Cardiologist: Larae Grooms, MD  Chart reviewed as part of pre-operative protocol coverage. Given past medical history and time since last visit, based on ACC/AHA guidelines, Tricia Gonzales would be at acceptable risk for the planned procedure without further cardiovascular testing.   Patient with diagnosis of atrial fibriliation on Eliquis for anticoagulation.     Procedure: endoscopy  Date of procedure: TBD     CHA2DS2-VASc Score = 5   This indicates a 7.2% annual risk of stroke. The patient's score is based upon: CHF History: 0 HTN History: 1 Diabetes History: 1 Stroke History: 0 Vascular Disease History: 0 Age Score: 2 Gender Score: 1     CrCl 24 mL/min(SrCr 1.85 04/15/2022) Platelet count 193 (02/24/2022)   Per office protocol, patient can hold Eliquis for 1 days prior to procedure.   Patient will not need bridging with Lovenox (enoxaparin) around procedure.  I will route this recommendation to the requesting party via Epic fax function and remove from pre-op pool.  Please call with questions.  Jossie Ng. Scarlet Abad NP-C     05/11/2022, 11:23 AM [address] Office [phone] Fax [phone]

## 2022-05-19 ENCOUNTER — Inpatient Hospital Stay (HOSPITAL_BASED_OUTPATIENT_CLINIC_OR_DEPARTMENT_OTHER): Payer: Medicare Other | Admitting: Oncology

## 2022-05-19 ENCOUNTER — Inpatient Hospital Stay: Payer: Medicare Other

## 2022-05-19 VITALS — BP 125/75 | HR 66 | Temp 98.1°F | Resp 20 | Ht <= 58 in | Wt 198.4 lb

## 2022-05-19 DIAGNOSIS — N183 Chronic kidney disease, stage 3 unspecified: Secondary | ICD-10-CM | POA: Insufficient documentation

## 2022-05-19 DIAGNOSIS — N189 Chronic kidney disease, unspecified: Secondary | ICD-10-CM

## 2022-05-19 DIAGNOSIS — D631 Anemia in chronic kidney disease: Secondary | ICD-10-CM

## 2022-05-19 DIAGNOSIS — R0600 Dyspnea, unspecified: Secondary | ICD-10-CM | POA: Insufficient documentation

## 2022-05-19 LAB — CBC WITH DIFFERENTIAL (CANCER CENTER ONLY)
Abs Immature Granulocytes: 0.01 10*3/uL (ref 0.00–0.07)
Basophils Absolute: 0 10*3/uL (ref 0.0–0.1)
Basophils Relative: 0 %
Eosinophils Absolute: 0 10*3/uL (ref 0.0–0.5)
Eosinophils Relative: 1 %
HCT: 31.9 % — ABNORMAL LOW (ref 36.0–46.0)
Hemoglobin: 9.9 g/dL — ABNORMAL LOW (ref 12.0–15.0)
Immature Granulocytes: 0 %
Lymphocytes Relative: 8 %
Lymphs Abs: 0.4 10*3/uL — ABNORMAL LOW (ref 0.7–4.0)
MCH: 31.4 pg (ref 26.0–34.0)
MCHC: 31 g/dL (ref 30.0–36.0)
MCV: 101.3 fL — ABNORMAL HIGH (ref 80.0–100.0)
Monocytes Absolute: 0.4 10*3/uL (ref 0.1–1.0)
Monocytes Relative: 8 %
Neutro Abs: 4.1 10*3/uL (ref 1.7–7.7)
Neutrophils Relative %: 83 %
Platelet Count: 165 10*3/uL (ref 150–400)
RBC: 3.15 MIL/uL — ABNORMAL LOW (ref 3.87–5.11)
RDW: 16.4 % — ABNORMAL HIGH (ref 11.5–15.5)
WBC Count: 4.9 10*3/uL (ref 4.0–10.5)
nRBC: 0 % (ref 0.0–0.2)

## 2022-05-19 LAB — IRON AND TIBC
Iron: 63 ug/dL (ref 28–170)
Saturation Ratios: 16 % (ref 10.4–31.8)
TIBC: 384 ug/dL (ref 250–450)
UIBC: 321 ug/dL

## 2022-05-19 LAB — FERRITIN: Ferritin: 32 ng/mL (ref 11–307)

## 2022-05-19 MED ORDER — EPOETIN ALFA-EPBX 10000 UNIT/ML IJ SOLN
20000.0000 [IU] | Freq: Once | INTRAMUSCULAR | Status: AC
  Administered 2022-05-19: 20000 [IU] via SUBCUTANEOUS

## 2022-05-19 MED ORDER — EPOETIN ALFA-EPBX 40000 UNIT/ML IJ SOLN
40000.0000 [IU] | Freq: Once | INTRAMUSCULAR | Status: AC
  Administered 2022-05-19: 40000 [IU] via SUBCUTANEOUS

## 2022-05-19 NOTE — Progress Notes (Deleted)
[facility] OFFICE PROGRESS NOTE   Diagnosis:   INTERVAL HISTORY:   ***  Objective:  Vital signs in last 24 hours:  Blood pressure 125/75, pulse 66, temperature 98.1 F (36.7 C), temperature source Oral, resp. rate 20, height _0  (1.473 m), weight 198 lb 6.4 oz (90 kg), SpO2 100 %.    HEENT: *** Lymphatics: *** Resp: *** Cardio: *** GI: *** Vascular: *** Neuro:***  Skin:***   Portacath/PICC-without erythema  Lab Results:  Lab Results  Component Value Date   WBC 4.9 05/19/2022   HGB 9.9 (L) 05/19/2022   HCT 31.9 (L) 05/19/2022   MCV 101.3 (H) 05/19/2022   PLT 165 05/19/2022   NEUTROABS 4.1 05/19/2022    CMP  Lab Results  Component Value Date   NA 139 04/15/2022   K 5.1 04/15/2022   CL 97 04/15/2022   CO2 28 04/15/2022   GLUCOSE 118 (H) 04/15/2022   BUN 61 (H) 04/15/2022   CREATININE 1.85 (H) 04/15/2022   CALCIUM 9.2 04/15/2022   PROT 6.3 11/13/2021   ALBUMIN 4.0 01/13/2022   AST 14 (L) 11/12/2020   ALT 9 11/12/2020   ALKPHOS 85 11/12/2020   BILITOT 0.6 11/12/2020   GFRNONAA 25 (L) 01/13/2022   GFRAA 24 (L) 08/05/2020    Lab Results  Component Value Date   CA125 17.0 01/28/2009     Medications: I have reviewed the patient's current medications.   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; quantitative immunoglobulins show IgG mildly decreased 654 (range [phone]), IgA and IgM in normal range; polyclonal serum light chain elevation 11/15/2015-hemoglobin 11.2 11/29/2015-hemoglobin  11.1 12/12/2005-hemoglobin 10.4; Aranesp 100 g every 2 weeks initiated 02/07/2016-hemoglobin 11.0, Aranesp held, changed to an every 3 week schedule Progressive anemia and decreased iron stores December 2017, treated with IV iron January 2018 with improvement She did not require Aranesp for 2 months and resumed Aranesp on a monthly schedule for 08/2016 Aranesp every 6 weeks beginning 05/04/2017 Progressive anemia and decreased iron stores 07/05/2017; IV iron  given 07/28/2017 and 08/06/2017 Persistent severe anemia, Aranesp change to every 2 weeks beginning 10/19/2017 Persistent anemia, decreased iron stores 11/30/2017 IV iron 12/13/2017, 12/20/2017, Decreased iron stores 05/17/2018  IV iron 06/24/2018 07/05/2018- no serum M spike, polyclonal serum light chain elevation, IgG mildly decreased with IgA and IgM within normal range  2 units of blood 08/17/2018 IV iron 08/17/2018 and 08/24/2018 Stool heme +08/30/2018, referred to GI 2 units of blood 09/01/2018 1 unit of blood 10/25/2018 11/11/2018 colonoscopy- diverticulosis in the entire examined colon.  End-to-end colocolonic anastomosis.  One 5 mm polyp in the ascending colon.  No cause for anemia. 11/11/2018 upper endoscopy- evidence of gastric antral vascular ectasia which is the likely cause of the patient's worsening anemia. 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 Hemoglobin stabilized following treatment of the Gave Every 6-week erythropoietin 06/22/2019, erythropoietin dose increased to 60,000 units 09/14/2019 Erythropoietin 60,000 units every 3 weeks 10/24/2019 Schedule changed to every 4 weeks 12/15/2019 IV iron 02/21/2020 and 02/29/2020, 06/25/2020, 07/03/2020 Erythropoietin adjusted to every 2 weeks beginning 05/23/2020 Erythropoietin changed to every 3 weeks beginning 11/12/2020 Erythropoietin continued every  3 weeks 01/14/2021 Erythropoietin changed to every 6 weeks 03/18/2021    2.   Chronic renal insufficiency   3.    Atrial fibrillation-maintained on apixaban   4.    Osteoarthritis   5.     Diabetes   6.     Sjogren's disease   7.     Pericarditis February 2017   8.     Colonoscopy 01/07/2012-prior segmental colectomy in the sigmoid colon; moderate diverticulosis throughout the colon; otherwise normal examination    Disposition: ***  [name], MD  05/19/2022  12:48 PM

## 2022-05-19 NOTE — Progress Notes (Signed)
[place] OFFICE PROGRESS NOTE   Diagnosis: Anemia, chronic renal failure  INTERVAL HISTORY:   Tricia Gonzales returns as scheduled.  She is taking iron.  No bleeding.  She complains of dyspnea.  She has intermittent abdominal pain.  She is followed by Dr. Therisa Doyne. She last received erythropoietin on 02/24/2022.  Objective:  Vital signs in last 24 hours:  Blood pressure 125/75, pulse 66, temperature 98.1 F (36.7 C), temperature source Oral, resp. rate 20, height _0  (1.473 m), weight 198 lb 6.4 oz (90 kg), SpO2 100 %.    Resp: Rhonchi at the lower posterior chest bilaterally, no respiratory distress Cardio: Regular rhythm, distant heart sounds GI: Ender in the left mid and lower abdomen, no mass, no hepatosplenomegaly Vascular: Ace edema to lower leg bilaterally lts:  Lab Results  Component Value Date   WBC 4.9 05/19/2022   HGB 9.9 (L) 05/19/2022   HCT 31.9 (L) 05/19/2022   MCV 101.3 (H) 05/19/2022   PLT 165 05/19/2022   NEUTROABS 4.1 05/19/2022    CMP  Lab Results  Component Value Date   NA 139 04/15/2022   K 5.1 04/15/2022   CL 97 04/15/2022   CO2 28 04/15/2022   GLUCOSE 118 (H) 04/15/2022   BUN 61 (H) 04/15/2022   CREATININE 1.85 (H) 04/15/2022   CALCIUM 9.2 04/15/2022   PROT 6.3 11/13/2021   ALBUMIN 4.0 01/13/2022   AST 14 (L) 11/12/2020   ALT 9 11/12/2020   ALKPHOS 85 11/12/2020   BILITOT 0.6 11/12/2020   GFRNONAA 25 (L) 01/13/2022   GFRAA 24 (L) 08/05/2020    Lab Results  Component Value Date   CA125 17.0 01/28/2009    Medications: I have reviewed the patient's current medications.   Assessment/Plan: Normocytic anemia-chronic normal ferritin, serum iron studies-low percent transferrin saturation, normal transferrin Hemoccult positive stool February 2017, upper endoscopy 08/12/2015 with no source for bleeding 09/27/2015 erythropoietin 40,000 units weekly initiated 09/27/2015 erythropoietin level 48.3 (range 2.6-18.5) 10/11/2015  serum protein electrophoresis with no M spike observed; serum IFE shows IgA monoclonal protein with lambda light chain specificity; quantitative immunoglobulins show IgG mildly decreased 654 (range [phone]), IgA and IgM in normal range; polyclonal serum light chain elevation 11/15/2015-hemoglobin 11.2 11/29/2015-hemoglobin 11.1 12/12/2005-hemoglobin 10.4; Aranesp 100 g every 2 weeks initiated 02/07/2016-hemoglobin 11.0, Aranesp held, changed to an every 3 week schedule Progressive anemia and decreased iron stores December 2017, treated with IV iron January 2018 with improvement She did not require Aranesp for 2 months and resumed Aranesp on a monthly schedule for 08/2016 Aranesp every 6 weeks beginning 05/04/2017 Progressive anemia and decreased iron stores 07/05/2017; IV iron  given 07/28/2017 and 08/06/2017 Persistent severe anemia, Aranesp change to every 2 weeks beginning 10/19/2017 Persistent anemia, decreased iron stores 11/30/2017 IV iron 12/13/2017, 12/20/2017, Decreased iron stores 05/17/2018  IV iron 06/24/2018 07/05/2018- no serum M spike, polyclonal serum light chain elevation, IgG mildly decreased with IgA and IgM within normal range  2 units of blood 08/17/2018 IV iron 08/17/2018 and 08/24/2018 Stool heme +08/30/2018, referred to GI 2 units of blood 09/01/2018 1 unit of blood 10/25/2018 11/11/2018 colonoscopy- diverticulosis in the entire examined colon.  End-to-end colocolonic anastomosis.  One 5 mm polyp in the ascending colon.  No cause for anemia. 11/11/2018 upper endoscopy- evidence of gastric antral vascular ectasia which is the likely cause of the patient's worsening anemia. 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 Hemoglobin stabilized following treatment of  the Gave Every 6-week erythropoietin 06/22/2019, erythropoietin dose increased to 60,000 units 09/14/2019 Erythropoietin 60,000 units every 3 weeks  10/24/2019 Schedule changed to every 4 weeks 12/15/2019 IV iron 02/21/2020 and 02/29/2020, 06/25/2020, 07/03/2020 Erythropoietin adjusted to every 2 weeks beginning 05/23/2020 Erythropoietin changed to every 3 weeks beginning 11/12/2020 Erythropoietin continued every 3 weeks 01/14/2021 Erythropoietin changed to every 6 weeks 03/18/2021   2.   Chronic renal insufficiency   3.    Atrial fibrillation-maintained on apixaban   4.    Osteoarthritis   5.     Diabetes   6.     Sjogren's disease   7.     Pericarditis February 2017   8.     Colonoscopy 01/07/2012-prior segmental colectomy in the sigmoid colon; moderate diverticulosis throughout the colon; otherwise normal examination      Disposition: Tricia Gonzales has anemia secondary to chronic renal insufficiency.  She missed the last scheduled dose of erythropoietin.  She is taking iron, but the ferritin level is below goal range.  She does not wish to receive IV iron at present.  She will receive an erythropoietin injection today and again in 6 weeks.  She will be seen for office visit in 6 weeks.  She was scheduled for [facility] when she returns in 6 weeks.  She will continue follow-up with gastroenterology for evaluation of abdominal pain.  [name], MD  05/19/2022  1:12 PM

## 2022-05-20 ENCOUNTER — Ambulatory Visit: Payer: Medicare Other | Attending: Physician Assistant | Admitting: Physician Assistant

## 2022-05-20 ENCOUNTER — Encounter: Payer: Self-pay | Admitting: Physician Assistant

## 2022-05-20 VITALS — BP 146/94 | HR 66 | Ht <= 58 in | Wt 194.0 lb

## 2022-05-20 DIAGNOSIS — N184 Chronic kidney disease, stage 4 (severe): Secondary | ICD-10-CM | POA: Diagnosis not present

## 2022-05-20 DIAGNOSIS — I5022 Chronic systolic (congestive) heart failure: Secondary | ICD-10-CM | POA: Diagnosis not present

## 2022-05-20 DIAGNOSIS — I4719 Other supraventricular tachycardia: Secondary | ICD-10-CM | POA: Diagnosis not present

## 2022-05-20 DIAGNOSIS — I340 Nonrheumatic mitral (valve) insufficiency: Secondary | ICD-10-CM

## 2022-05-20 DIAGNOSIS — I480 Paroxysmal atrial fibrillation: Secondary | ICD-10-CM

## 2022-05-20 DIAGNOSIS — Z950 Presence of cardiac pacemaker: Secondary | ICD-10-CM

## 2022-05-20 DIAGNOSIS — E785 Hyperlipidemia, unspecified: Secondary | ICD-10-CM

## 2022-05-20 DIAGNOSIS — E119 Type 2 diabetes mellitus without complications: Secondary | ICD-10-CM

## 2022-05-20 DIAGNOSIS — I10 Essential (primary) hypertension: Secondary | ICD-10-CM

## 2022-05-20 NOTE — Progress Notes (Signed)
Cardiology Office Note:    Date:  05/21/2022   ID:  Tricia Gonzales, DOB 09/21/1940, MRN [id number]  PCP:  Carol Ada, [place] Providers Cardiologist:  Evalina Field, MD Electrophysiologist:  Cristopher Peru, MD     Referring MD: Carol Ada, MD   Chief Complaint  Patient presents with   Shortness of Breath    Instantly upon exertion     History of Present Illness:    Tricia Gonzales is a 81 y.o. female with a hx of PAF, chronic systolic heart failure, HTN, HLD, DM2, and CKD stage IV.  She has been followed by nephrology service.  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  Patient was previously diagnosed with PAF and underwent DCCV on 07/31/2020.  Limited echocardiogram obtained at the time showed EF 50 to 55%, moderately elevated pulmonary artery systolic pressure, moderate to severe MR.  She has a history of GI bleed and Sjogren's.  Patient was last seen by Dr. Audie Box on 02/06/2022 at which time she was maintaining sinus rhythm and has been compliant with Eliquis 2.5 mg twice a day.  Due to elevated blood pressure, carvedilol was increased to 6.25 mg twice a day.  29-monthfollow-up was recommended.   I saw the patient on 03/17/2022 for evaluation of dyspnea on exertion that has been going on for several month.  She says her symptom has been going on since her cardiac medication was adjusted including increased carvedilol and lowering of Eliquis.  I told the patient that medication adjustment is unlikely to have caused her symptom.  I suspected her shortness of breath was caused by worsening valve issue and recommended echocardiogram.  We did a remote interrogation, she is maintaining sinus rhythm and only had 1 minute of A-fib the month prior.  I asked her to increase torsemide to 20 mg twice a day for 3 days before going down to the previous dose, however she was hesitant to do so given her poor renal function.  Lab work  showed her proBNP was 65,000.  Creatinine was 2.16.  Subsequent echocardiogram obtained on 03/20/2022 showed EF 25 to 30%, moderately enlarged RV with RV systolic pressure 425.3mmHg, moderately dilated left and the right atrium, moderate to severe MR was significant artifact and eccentric jet posteriorly directed, may be underestimated, moderate MAC, severe TR.   I saw the patient in 03/27/2022 at which time she continued to have dyspnea on exertion.  I increased her torsemide to 40 mg daily.  I initially suspected her EF drop was due to worsening mitral regurgitation, however after speaking with DOD we interrogated her pacemaker, Dr. CSallyanne Kusterfelt she was in a fast atrial tachycardic episode.  He was able to pace the patient out of the tachycardic episode using her own pacemaker.  We referred the patient to EP service to help manage the tachycardic and rhythm.  During the last visit, I increased her carvedilol to 12.5 mg twice a day.  She was seen by Dr. TLovena Leon 04/16/2022, carvedilol was further increased to 18.75 mg twice a day.  Repeat limited echocardiogram obtained on 05/11/2022 showed EF 30 to 35%, moderate to severe MR.  EF has improved slightly from the previous 25 to 30%.  Patient presents today for follow-up.  She has been feeling very fatigued after her carvedilol was increased to 18.75 mg twice a day by Dr. TLovena Le  I encouraged her to continue on the  higher dose as it is controlling her heart rate in the 60s range.  Hopefully this will give her enough time for her ejection fraction to improve.  I previously spoke to her about her primary cardiologist, she initially wanted to switch cardiologist to Dr. Irish Lack, however after further discussion, she decided to stay with Dr. Audie Box.  Given her heart failure and severely elevated BNP, I have recommended to refer the patient to heart failure service, however she wished to hold off at this time.  I emphasized on the importance of having a specialist who  can aggressively titrate her medication to improve her heart failure symptoms and ejection fraction, however again she does not wish to consider heart failure service referral at this time.  I will discuss her case with Dr. Audie Box.  I did recommend another basic metabolic panel to follow-up on her renal function.  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   Anxiety    Arthritis    Atrial fibrillation ([place])    Avascular necrosis (HCC) hip left   leg pain also   Chronic back pain    Chronic kidney disease (CKD), stage III (moderate) (HCC)    Depression    Diverticulosis    Early cataracts, bilateral    Esophageal stricture    Gastritis    GAVE (gastric antral vascular ectasia)    GERD (gastroesophageal reflux disease)    Glaucoma    Heart murmur     " some doctors say that I have one some say that I don"t "   Hiatal hernia    History of blood transfusion    "@ least w/1st knee OR"   History of gout    History of kidney stones    Hyperlipidemia    Hypertension    Intestinal obstruction ([place])    Kidney stones    Obesity    Osteoarthritis    Osteoporosis    Pericarditis 2016   Presence of permanent cardiac pacemaker    Sjogren's disease ([place])    Thyroid disease    Type II diabetes mellitus ([place])    Vitamin B12 deficiency     Past Surgical History:  Procedure Laterality Date   ABDOMINAL HYSTERECTOMY     complete   CARDIOVERSION N/A 07/31/2020   Procedure: CARDIOVERSION;  Surgeon: Werner Lean, MD;  Location: [facility];  Service: Cardiovascular;  Laterality: N/A;   CHOLECYSTECTOMY OPEN     COLECTOMY     for rectovaginal fistula   COLONOSCOPY  01/07/2012   Procedure: COLONOSCOPY;  Surgeon: Lafayette Dragon, MD;  Location: [facility];  Service: Endoscopy;  Laterality: N/A;   EP IMPLANTABLE DEVICE N/A 06/18/2015   Procedure: Pacemaker Implant;  Surgeon: Evans Lance, MD;  Location: [facility];   Service: Cardiovascular;  Laterality: N/A;   ESOPHAGEAL DILATION  08/17/2020   Procedure: ESOPHAGEAL DILATION;  Surgeon: Lavena Bullion, DO;  Location: [facility] ENDOSCOPY;  Service: Gastroenterology;;   ESOPHAGOGASTRODUODENOSCOPY N/A 11/24/2018   Procedure: ESOPHAGOGASTRODUODENOSCOPY (EGD);  Surgeon: Yetta Flock, MD;  Location: [facility];  Service: Gastroenterology;  Laterality: N/A;   ESOPHAGOGASTRODUODENOSCOPY (EGD) WITH ESOPHAGEAL DILATION     ESOPHAGOGASTRODUODENOSCOPY (EGD) WITH PROPOFOL N/A 08/12/2015   Procedure: ESOPHAGOGASTRODUODENOSCOPY (EGD) WITH PROPOFOL;  Surgeon: Manus Gunning, MD;  Location: [facility];  Service: Gastroenterology;  Laterality: N/A;   ESOPHAGOGASTRODUODENOSCOPY (EGD) WITH PROPOFOL N/A 08/17/2020   Procedure: ESOPHAGOGASTRODUODENOSCOPY (EGD) WITH PROPOFOL;  Surgeon: Bryan Lemma,  Dominic Pea, DO;  Location: [facility] ENDOSCOPY;  Service: Gastroenterology;  Laterality: N/A;   EYE SURGERY Bilateral    cataracts   HOT HEMOSTASIS N/A 11/24/2018   Procedure: HOT HEMOSTASIS (ARGON PLASMA COAGULATION/BICAP);  Surgeon: Yetta Flock, MD;  Location: [facility];  Service: Gastroenterology;  Laterality: N/A;   HOT HEMOSTASIS N/A 08/17/2020   Procedure: HOT HEMOSTASIS (ARGON PLASMA COAGULATION/BICAP);  Surgeon: Lavena Bullion, DO;  Location[facility] END[facility]COPY;  Service: Gastroenterology;  Laterality: N/A;   I & D EXTREMITY Right 08/06/2016   Procedure: DEBRIDEMENT PIP RIGHT RING FINGER;  Surgeon: Daryll Brod, MD;  Location: [facility];  Service: Orthopedics;  Laterality: Right;   INSERT / REPLACE / REMOVE PACEMAKER     JOINT REPLACEMENT     KNEE ARTHROSCOPY Right    MASS EXCISION Right 08/06/2016   Procedure: EXCISION CYST;  Surgeon: Daryll Brod, MD;  Location: [facility];  Service: Orthopedics;  Laterality: Right;   REVISION TOTAL KNEE ARTHROPLASTY Left    TOTAL KNEE ARTHROPLASTY Bilateral     Current Medications: Current Meds  Medication Sig   ACCU-CHEK AVIVA PLUS test  strip 1 each by Other route daily.    acetaminophen (TYLENOL) 650 MG CR tablet Take 1,300 mg by mouth every evening.   BD PEN NEEDLE NANO U/F 32G X 4 MM MISC See admin instructions.   carvedilol (COREG) 12.5 MG tablet Take 1 1/2 tablet ( 18.75 MG ) by mouth twice a day   Cholecalciferol (VITAMIN D3) 25 MCG (1000 UT) CAPS Take 1,000 Units by mouth daily.   cycloSPORINE (RESTASIS) 0.05 % ophthalmic emulsion Place 1 drop into both eyes 2 (two) times daily.   dicyclomine (BENTYL) 10 MG capsule Take 1 capsule (10 mg total) by mouth every 8 (eight) hours as needed for spasms.   ELIQUIS 2.5 MG TABS tablet TAKE ONE TABLET TWICE DAILY   febuxostat (ULORIC) 40 MG tablet Take 1 tablet (40 mg total) by mouth every Monday, Wednesday, and Friday.   ferrous sulfate 325 (65 FE) MG EC tablet Take 1 tablet (325 mg total) by mouth 2 (two) times daily.   gabapentin (NEURONTIN) 400 MG capsule Take 1 capsule (400 mg total) by mouth at bedtime.   levothyroxine (SYNTHROID) 75 MCG tablet Take 75 mcg by mouth every morning.   lidocaine (LIDODERM) 5 % Place 1-3 patches onto the skin See admin instructions. Apply 1-3 patches transdermally every 24 hours as needed for pain and remove & discard patches within 12 hours or as directed by MD   montelukast (SINGULAIR) 10 MG tablet Take 1 tablet (10 mg total) by mouth at bedtime.   nystatin (NYAMYC) powder APPLY TOPICALLY TO LEFT ABDOMINAL FOLD AND GROIN TWICE A DAY   ondansetron (ZOFRAN-ODT) 4 MG disintegrating tablet Take 1 tablet (4 mg total) by mouth every 8 (eight) hours as needed for nausea or vomiting.   pantoprazole (PROTONIX) 40 MG tablet Take 40 mg by mouth daily.   pentoxifylline (TRENTAL) 400 MG CR tablet TAKE ONE TABLET EACH DAY WITH A MEAL   rOPINIRole (REQUIP) 0.5 MG tablet Take 0.5 mg by mouth at bedtime.   rosuvastatin (CRESTOR) 5 MG tablet Take 1 tablet (5 mg total) by mouth daily.   sennosides-docusate sodium (SENOKOT-S) 8.6-50 MG tablet 1 tablet   SF 5000 PLUS  1.1 % CREA dental cream Take by mouth as directed.   SIMBRINZA 1-0.2 % SUSP Apply 1 drop to eye 2 (two) times daily.   torsemide (DEMADEX) 20 MG tablet Take 2  tablets (40 mg total) by mouth daily.   traMADol (ULTRAM) 50 MG tablet Take 50 mg by mouth See admin instructions. Take 50 mg by mouth at bedtime and an additional 50 mg once a day as needed for pain   TRESIBA FLEXTOUCH 100 UNIT/ML FlexTouch Pen Inject 10 Units into the skin every evening.     Allergies:   Norvasc [amlodipine besylate], Other, Amlodipine besylate, Avapro [irbesartan], Glucophage [metformin hydrochloride], Lisinopril, Losartan, Morphine and related, Simvastatin, and Trulicity [dulaglutide]   Social History   Socioeconomic History   Marital status: Divorced    Spouse name: Not on file   Number of children: 2   Years of education: Not on file   Highest education level: Not on file  Occupational History   Occupation: retired  Tobacco Use   Smoking status: Former    Packs/day: 2.00    Years: 3.00    Total pack years: 6.00    Types: Cigarettes    Quit date: 06/23/1983    Years since quitting: 38.9    Passive exposure: Never   Smokeless tobacco: Never  Vaping Use   Vaping Use: Never used  Substance and Sexual Activity   Alcohol use: Yes    Alcohol/week: 2.0 standard drinks of alcohol    Types: 2 Glasses of wine per week    Comment: occ   Drug use: No   Sexual activity: Not Currently  Other Topics Concern   Not on file  Social History Narrative   Not on file   Social Determinants of Health   Financial Resource Strain: Not on file  Food Insecurity: Not on file  Transportation Needs: Not on file  Physical Activity: Not on file  Stress: Not on file  Social Connections: Not on file     Family History: The patient's family history includes Diabetes in her brother and mother; Hypertension in her sister. There is no history of Colon cancer, Heart attack, Stroke, Colon polyps, Esophageal cancer, Rectal  cancer, or Stomach cancer.  ROS:   Please see the history of present illness.     All other systems reviewed and are negative.  EKGs/Labs/Other Studies Reviewed:    The following studies were reviewed today:  Echo 03/20/2022  1. Left ventricular ejection fraction, by estimation, is 25 to 30%. The  left ventricle has severely decreased function. The left ventricle  demonstrates global hypokinesis. There is mild left ventricular  hypertrophy. Left ventricular diastolic parameters   are indeterminate.   2. Right ventricular systolic function is moderately reduced. The right  ventricular size is moderately enlarged. There is mildly elevated  pulmonary artery systolic pressure. The estimated right ventricular  systolic pressure is 41.9 mmHg.   3. Left atrial size was moderately dilated.   4. Right atrial size was mild to moderately dilated.   5. The mitral valve is degenerative. Moderate to severe mitral valve  regurgitation with significant splay artifact and eccentric jet  posteriorly directed, may be underestimated. Moderate MAC. No evidence of  mitral stenosis.   6. Tricuspid valve regurgitation is severe.   7. The aortic valve is tricuspid. There is mild calcification of the  aortic valve. There is moderate thickening of the aortic valve. Aortic  valve regurgitation is trivial. No aortic stenosis is present.   8. The inferior vena cava is dilated in size with <50% respiratory  variability, suggesting right atrial pressure of 15 mmHg.   Comparison(s): A prior study was performed on 08/16/20. Changes from prior  study are  noted.     Limited echo 05/11/2022  1. Left ventricular ejection fraction, by estimation, is 30 to 35%. The  left ventricle demonstrates global hypokinesis. There is mild concentric  left ventricular hypertrophy. Left ventricular diastolic function could  not be evaluated.   2. Right ventricular systolic function is moderately reduced. The right  ventricular  size is moderately enlarged. There is severely elevated  pulmonary artery systolic pressure.   3. The mitral valve is normal in structure. Moderate to severe mitral  valve regurgitation. No evidence of mitral stenosis. Moderate mitral  annular calcification.   4. Tricuspid valve regurgitation is severe.   5. The aortic valve is normal in structure. There is mild calcification  of the aortic valve. Aortic valve regurgitation is not visualized. Aortic  valve sclerosis is present, with no evidence of aortic valve stenosis.   6. The inferior vena cava is normal in size with greater than 50%  respiratory variability, suggesting right atrial pressure of 3 mmHg.   Comparison(s): 03/20/22 EF 25-30%. PA pressure 23mHg. Moderate-severe MR.  RV moderately enlarged.    EKG:  EKG is not ordered today.    Recent Labs: 04/02/2022: NT-Pro BNP 69,331 05/19/2022: Hemoglobin 9.9; Platelet Count 165 05/20/2022: BUN 76; Creatinine, Ser 2.41; Potassium 5.1; Sodium 143  Recent Lipid Panel No results found for: "CHOL", "TRIG", "HDL", "CHOLHDL", "VLDL", "LDLCALC", "LDLDIRECT"   Risk Assessment/Calculations:    CHA2DS2-VASc Score = 5   This indicates a 7.2% annual risk of stroke. The patient's score is based upon: CHF History: 0 HTN History: 1 Diabetes History: 1 Stroke History: 0 Vascular Disease History: 0 Age Score: 2 Gender Score: 1          Physical Exam:    VS:  BP (!) 146/94 (BP Location: Left Arm, Patient Position: Sitting, Cuff Size: Large)   Pulse 66   Ht _0  (1.473 m)   Wt 194 lb (88 kg)   SpO2 95%   BMI 40.55 kg/m        Wt Readings from Last 3 Encounters:  05/20/22 194 lb (88 kg)  05/19/22 198 lb 6.4 oz (90 kg)  04/16/22 191 lb (86.6 kg)     GEN:  Well nourished, well developed in no acute distress HEENT: Normal NECK: No JVD; No carotid bruits LYMPHATICS: No lymphadenopathy CARDIAC: RRR, no murmurs, rubs, gallops RESPIRATORY:  Clear to auscultation without  rales, wheezing or rhonchi  ABDOMEN: Soft, non-tender, non-distended MUSCULOSKELETAL:  1-2+ edema; No deformity  SKIN: Warm and dry NEUROLOGIC:  Alert and oriented x 3 PSYCHIATRIC:  Normal affect   ASSESSMENT:    1. Chronic systolic heart failure ([place]   2. Chronic kidney disease (CKD), stage IV (severe) ([place]   3. PAF (paroxysmal atrial fibrillation) (HCC)   4. Ectopic atrial tachycardia   5. Essential hypertension   6. Hyperlipidemia LDL goal <70   7. Controlled type 2 diabetes mellitus without complication, without long-term current use of insulin ([place]   8. Pacemaker   9. Mitral valve insufficiency, unspecified etiology    PLAN:    In order of problems listed above:  Chronic systolic heart failure: She was initially noted to have low ejection fraction on echocardiogram 03/20/2022, initially I suspected it may be related to worsening mitral regurgitation, however echocardiogram showed her MR is more in the moderate to severe range.  When she returned for follow-up, we noted she was in ectopic atrial tachycardia with a heart rate in the 120s.  Her device was interrogated  by DOD Dr. Sallyanne Kuster at the time who noted that she has frequent episode of ectopic atrial tachycardia which may explain her lower ejection fraction.  I increased her torsemide and carvedilol.  At the recommendation of Dr. Sallyanne Kuster, we referred the patient to Dr. Lovena Le for consideration for antiarrhythmic therapy, Dr. Lovena Le increased her carvedilol further to 18.75 mg twice a day on the last office visit.  Her heart rate is very well-controlled at this time.  Last echocardiogram showed her EF has improved slightly from the previous 25 to 30% to 30 to 35%.  She still has 1-2+ pitting edema, however her lungs is clear.  I will repeat a basic metabolic panel today to make sure her renal function is stable.  If renal function is stable, I would like for her to take extra tablet of the torsemide for the next 2 days to see if we  can reduce the swelling further  CKD stage IV: Followed by Dr. Cassandria Santee, obtain basic metabolic panel  PAF: Recent interrogation shows minimal recurrent A-fib.  Her previous interrogation only showed 1 minute of atrial fibrillation.  Ectopic atrial tachycardia: Likely responsible for her worsening ejection fraction.  Carvedilol has been increased to suppressed the atrial tachycardia  Hypertension: Blood pressure stable after up titration of carvedilol.  The patient has been feeling more fatigued and the wish to decrease her carvedilol at this time, I am hesitant to do so.  I encouraged her to continue on the carvedilol for the time being.  Hyperlipidemia: On Crestor  DM2: Managed by her primary care provider  History of symptomatic bradycardia s/p Medtronic dual-chamber pacemaker: Followed by Dr. Lovena Le  Moderate to severe MR: Unchanged on the most recent echocardiogram.           Medication Adjustments/Labs and Tests Ordered: Current medicines are reviewed at length with the patient today.  Concerns regarding medicines are outlined above.  Orders Placed This Encounter  Procedures   Basic metabolic panel   No orders of the defined types were placed in this encounter.   Patient Instructions  Medication Instructions:   Your physician recommends that you continue on your current medications as directed. Please refer to the Current Medication list given to you today.  *If you need a refill on your cardiac medications before your next appointment, please call your pharmacy*  Lab Work: Your physician recommends that you return for lab work TODAY:  BMP  If you have labs (blood work) drawn today and your tests are completely normal, you will receive your results only by: [place] (if you have [place]) OR A paper copy in the mail If you have any lab test that is abnormal or we need to change your treatment, we will call you to review the  results.  Testing/Procedures: NONE ordered at this time of appointment   Follow-Up: At [facility], you and your health needs are our priority.  As part of our continuing mission to provide you with exceptional heart care, we have created designated Provider Care Teams.  These Care Teams include your primary Cardiologist (physician) and Advanced Practice Providers (APPs -  Physician Assistants and Nurse Practitioners) who all work together to provide you with the care you need, when you need it.  We recommend signing up for the patient portal called "MyChart".  Sign up information is provided on this After Visit Summary.  MyChart is used to connect with patients for Virtual Visits (Telemedicine).  Patients are able to view lab/test  results, encounter notes, upcoming appointments, etc.  Non-urgent messages can be sent to your provider as well.   To learn more about what you can do with MyChart, go to [email or web address].    Your next appointment:   1 month(s)  The format for your next appointment:   In Person  Provider:   Almyra Deforest, PA-C        Other Instructions  Important Information About Sugar         Signed, Almyra Deforest, [place]  05/21/2022 3:59 PM    [place]

## 2022-05-20 NOTE — Patient Instructions (Addendum)
Medication Instructions:   Your physician recommends that you continue on your current medications as directed. Please refer to the Current Medication list given to you today.  *If you need a refill on your cardiac medications before your next appointment, please call your pharmacy*  Lab Work: Your physician recommends that you return for lab work TODAY:  BMP  If you have labs (blood work) drawn today and your tests are completely normal, you will receive your results only by: [place] (if you have [place]) OR A paper copy in the mail If you have any lab test that is abnormal or we need to change your treatment, we will call you to review the results.  Testing/Procedures: NONE ordered at this time of appointment   Follow-Up: At [facility], you and your health needs are our priority.  As part of our continuing mission to provide you with exceptional heart care, we have created designated Provider Care Teams.  These Care Teams include your primary Cardiologist (physician) and Advanced Practice Providers (APPs -  Physician Assistants and Nurse Practitioners) who all work together to provide you with the care you need, when you need it.  We recommend signing up for the patient portal called "MyChart".  Sign up information is provided on this After Visit Summary.  MyChart is used to connect with patients for Virtual Visits (Telemedicine).  Patients are able to view lab/test results, encounter notes, upcoming appointments, etc.  Non-urgent messages can be sent to your provider as well.   To learn more about what you can do with MyChart, go to [email or web address].    Your next appointment:   1 month(s)  The format for your next appointment:   In Person  Provider:   Almyra Deforest, PA-C        Other Instructions  Important Information About Sugar

## 2022-05-21 ENCOUNTER — Telehealth: Payer: Self-pay | Admitting: Physician Assistant

## 2022-05-21 ENCOUNTER — Inpatient Hospital Stay (HOSPITAL_COMMUNITY)
Admission: AD | Admit: 2022-05-21 | Discharge: 2022-05-28 | DRG: 291 | Disposition: A | Discharge status: Home Health | Payer: Medicare Other | Attending: Internal Medicine | Admitting: Internal Medicine

## 2022-05-21 ENCOUNTER — Encounter (HOSPITAL_COMMUNITY): Payer: Self-pay

## 2022-05-21 DIAGNOSIS — I071 Rheumatic tricuspid insufficiency: Secondary | ICD-10-CM | POA: Insufficient documentation

## 2022-05-21 DIAGNOSIS — Z794 Long term (current) use of insulin: Secondary | ICD-10-CM

## 2022-05-21 DIAGNOSIS — Z7901 Long term (current) use of anticoagulants: Secondary | ICD-10-CM | POA: Diagnosis not present

## 2022-05-21 DIAGNOSIS — N179 Acute kidney failure, unspecified: Secondary | ICD-10-CM | POA: Diagnosis present

## 2022-05-21 DIAGNOSIS — N184 Chronic kidney disease, stage 4 (severe): Secondary | ICD-10-CM | POA: Diagnosis present

## 2022-05-21 DIAGNOSIS — E119 Type 2 diabetes mellitus without complications: Secondary | ICD-10-CM

## 2022-05-21 DIAGNOSIS — I442 Atrioventricular block, complete: Secondary | ICD-10-CM | POA: Diagnosis present

## 2022-05-21 DIAGNOSIS — I130 Hypertensive heart and chronic kidney disease with heart failure and stage 1 through stage 4 chronic kidney disease, or unspecified chronic kidney disease: Secondary | ICD-10-CM | POA: Diagnosis present

## 2022-05-21 DIAGNOSIS — Z950 Presence of cardiac pacemaker: Secondary | ICD-10-CM

## 2022-05-21 DIAGNOSIS — M1990 Unspecified osteoarthritis, unspecified site: Secondary | ICD-10-CM | POA: Diagnosis present

## 2022-05-21 DIAGNOSIS — Z90710 Acquired absence of both cervix and uterus: Secondary | ICD-10-CM

## 2022-05-21 DIAGNOSIS — E669 Obesity, unspecified: Secondary | ICD-10-CM | POA: Diagnosis present

## 2022-05-21 DIAGNOSIS — Z79899 Other long term (current) drug therapy: Secondary | ICD-10-CM

## 2022-05-21 DIAGNOSIS — I081 Rheumatic disorders of both mitral and tricuspid valves: Secondary | ICD-10-CM | POA: Diagnosis present

## 2022-05-21 DIAGNOSIS — H409 Unspecified glaucoma: Secondary | ICD-10-CM | POA: Diagnosis present

## 2022-05-21 DIAGNOSIS — I5023 Acute on chronic systolic (congestive) heart failure: Secondary | ICD-10-CM | POA: Diagnosis present

## 2022-05-21 DIAGNOSIS — E1122 Type 2 diabetes mellitus with diabetic chronic kidney disease: Secondary | ICD-10-CM | POA: Diagnosis present

## 2022-05-21 DIAGNOSIS — I340 Nonrheumatic mitral (valve) insufficiency: Secondary | ICD-10-CM | POA: Diagnosis not present

## 2022-05-21 DIAGNOSIS — I495 Sick sinus syndrome: Secondary | ICD-10-CM | POA: Diagnosis present

## 2022-05-21 DIAGNOSIS — E039 Hypothyroidism, unspecified: Secondary | ICD-10-CM | POA: Diagnosis present

## 2022-05-21 DIAGNOSIS — E118 Type 2 diabetes mellitus with unspecified complications: Secondary | ICD-10-CM | POA: Diagnosis not present

## 2022-05-21 DIAGNOSIS — M810 Age-related osteoporosis without current pathological fracture: Secondary | ICD-10-CM | POA: Diagnosis present

## 2022-05-21 DIAGNOSIS — Z8249 Family history of ischemic heart disease and other diseases of the circulatory system: Secondary | ICD-10-CM

## 2022-05-21 DIAGNOSIS — I272 Pulmonary hypertension, unspecified: Secondary | ICD-10-CM | POA: Insufficient documentation

## 2022-05-21 DIAGNOSIS — I428 Other cardiomyopathies: Secondary | ICD-10-CM | POA: Diagnosis present

## 2022-05-21 DIAGNOSIS — E785 Hyperlipidemia, unspecified: Secondary | ICD-10-CM | POA: Diagnosis present

## 2022-05-21 DIAGNOSIS — Z87442 Personal history of urinary calculi: Secondary | ICD-10-CM

## 2022-05-21 DIAGNOSIS — I480 Paroxysmal atrial fibrillation: Secondary | ICD-10-CM | POA: Diagnosis present

## 2022-05-21 DIAGNOSIS — D631 Anemia in chronic kidney disease: Secondary | ICD-10-CM | POA: Diagnosis present

## 2022-05-21 DIAGNOSIS — M3500 Sicca syndrome, unspecified: Secondary | ICD-10-CM | POA: Diagnosis present

## 2022-05-21 DIAGNOSIS — Z833 Family history of diabetes mellitus: Secondary | ICD-10-CM

## 2022-05-21 DIAGNOSIS — I5021 Acute systolic (congestive) heart failure: Principal | ICD-10-CM | POA: Diagnosis present

## 2022-05-21 DIAGNOSIS — I2723 Pulmonary hypertension due to lung diseases and hypoxia: Secondary | ICD-10-CM | POA: Diagnosis present

## 2022-05-21 DIAGNOSIS — I4719 Other supraventricular tachycardia: Secondary | ICD-10-CM | POA: Diagnosis present

## 2022-05-21 DIAGNOSIS — I50810 Right heart failure, unspecified: Secondary | ICD-10-CM | POA: Diagnosis present

## 2022-05-21 DIAGNOSIS — Z87891 Personal history of nicotine dependence: Secondary | ICD-10-CM

## 2022-05-21 DIAGNOSIS — Z79890 Hormone replacement therapy (postmenopausal): Secondary | ICD-10-CM

## 2022-05-21 DIAGNOSIS — I4891 Unspecified atrial fibrillation: Secondary | ICD-10-CM | POA: Diagnosis present

## 2022-05-21 DIAGNOSIS — N183 Chronic kidney disease, stage 3 unspecified: Secondary | ICD-10-CM | POA: Diagnosis present

## 2022-05-21 DIAGNOSIS — I10 Essential (primary) hypertension: Secondary | ICD-10-CM | POA: Diagnosis present

## 2022-05-21 LAB — CBC WITH DIFFERENTIAL/PLATELET
Abs Immature Granulocytes: 0.03 10*3/uL (ref 0.00–0.07)
Basophils Absolute: 0 10*3/uL (ref 0.0–0.1)
Basophils Relative: 0 %
Eosinophils Absolute: 0 10*3/uL (ref 0.0–0.5)
Eosinophils Relative: 0 %
HCT: 31.8 % — ABNORMAL LOW (ref 36.0–46.0)
Hemoglobin: 9.8 g/dL — ABNORMAL LOW (ref 12.0–15.0)
Immature Granulocytes: 1 %
Lymphocytes Relative: 7 %
Lymphs Abs: 0.4 10*3/uL — ABNORMAL LOW (ref 0.7–4.0)
MCH: 31.4 pg (ref 26.0–34.0)
MCHC: 30.8 g/dL (ref 30.0–36.0)
MCV: 101.9 fL — ABNORMAL HIGH (ref 80.0–100.0)
Monocytes Absolute: 0.5 10*3/uL (ref 0.1–1.0)
Monocytes Relative: 9 %
Neutro Abs: 4.8 10*3/uL (ref 1.7–7.7)
Neutrophils Relative %: 83 %
Platelets: 166 10*3/uL (ref 150–400)
RBC: 3.12 MIL/uL — ABNORMAL LOW (ref 3.87–5.11)
RDW: 16.3 % — ABNORMAL HIGH (ref 11.5–15.5)
WBC: 5.8 10*3/uL (ref 4.0–10.5)
nRBC: 0 % (ref 0.0–0.2)

## 2022-05-21 LAB — COMPREHENSIVE METABOLIC PANEL
ALT: 17 U/L (ref 0–44)
AST: 21 U/L (ref 15–41)
Albumin: 3.4 g/dL — ABNORMAL LOW (ref 3.5–5.0)
Alkaline Phosphatase: 80 U/L (ref 38–126)
Anion gap: 11 (ref 5–15)
BUN: 79 mg/dL — ABNORMAL HIGH (ref 8–23)
CO2: 26 mmol/L (ref 22–32)
Calcium: 9 mg/dL (ref 8.9–10.3)
Chloride: 103 mmol/L (ref 98–111)
Creatinine, Ser: 2.51 mg/dL — ABNORMAL HIGH (ref 0.44–1.00)
GFR, Estimated: 19 mL/min — ABNORMAL LOW (ref >60)
Glucose, Bld: 180 mg/dL — ABNORMAL HIGH (ref 70–99)
Potassium: 4.4 mmol/L (ref 3.5–5.1)
Sodium: 140 mmol/L (ref 135–145)
Total Bilirubin: 0.9 mg/dL (ref 0.3–1.2)
Total Protein: 5.9 g/dL — ABNORMAL LOW (ref 6.5–8.1)

## 2022-05-21 LAB — GLUCOSE, CAPILLARY: Glucose-Capillary: 171 mg/dL — ABNORMAL HIGH (ref 70–99)

## 2022-05-21 LAB — BASIC METABOLIC PANEL
BUN/Creatinine Ratio: 32 — ABNORMAL HIGH (ref 12–28)
BUN: 76 mg/dL (ref 8–27)
CO2: 30 mmol/L — ABNORMAL HIGH (ref 20–29)
Calcium: 9.5 mg/dL (ref 8.7–10.3)
Chloride: 99 mmol/L (ref 96–106)
Creatinine, Ser: 2.41 mg/dL — ABNORMAL HIGH (ref 0.57–1.00)
Glucose: 132 mg/dL — ABNORMAL HIGH (ref 70–99)
Potassium: 5.1 mmol/L (ref 3.5–5.2)
Sodium: 143 mmol/L (ref 134–144)
eGFR: 20 mL/min/{1.73_m2} — ABNORMAL LOW (ref >59)

## 2022-05-21 LAB — BRAIN NATRIURETIC PEPTIDE: B Natriuretic Peptide: 2879.8 pg/mL — ABNORMAL HIGH (ref 0.0–100.0)

## 2022-05-21 LAB — MAGNESIUM: Magnesium: 2.3 mg/dL (ref 1.7–2.4)

## 2022-05-21 MED ORDER — SENNOSIDES-DOCUSATE SODIUM 8.6-50 MG PO TABS
2.0000 | ORAL_TABLET | Freq: Every day | ORAL | Status: DC
  Administered 2022-05-22 – 2022-05-26 (×5): 2 via ORAL
  Filled 2022-05-21 (×5): qty 2

## 2022-05-21 MED ORDER — BRIMONIDINE TARTRATE 0.2 % OP SOLN
1.0000 [drp] | Freq: Three times a day (TID) | OPHTHALMIC | Status: DC
  Administered 2022-05-21 – 2022-05-28 (×15): 1 [drp] via OPHTHALMIC
  Filled 2022-05-21: qty 5

## 2022-05-21 MED ORDER — BRINZOLAMIDE 1 % OP SUSP
1.0000 [drp] | Freq: Three times a day (TID) | OPHTHALMIC | Status: DC
  Administered 2022-05-21 – 2022-05-26 (×12): 1 [drp] via OPHTHALMIC
  Filled 2022-05-21: qty 10

## 2022-05-21 MED ORDER — CARVEDILOL 6.25 MG PO TABS
18.7500 mg | ORAL_TABLET | Freq: Two times a day (BID) | ORAL | Status: DC
  Administered 2022-05-22 – 2022-05-28 (×13): 18.75 mg via ORAL
  Filled 2022-05-21 (×15): qty 1

## 2022-05-21 MED ORDER — FEBUXOSTAT 40 MG PO TABS
40.0000 mg | ORAL_TABLET | ORAL | Status: DC
  Administered 2022-05-22 – 2022-05-27 (×3): 40 mg via ORAL
  Filled 2022-05-21 (×3): qty 1

## 2022-05-21 MED ORDER — TRAMADOL HCL 50 MG PO TABS
50.0000 mg | ORAL_TABLET | Freq: Every day | ORAL | Status: DC
  Administered 2022-05-21 – 2022-05-27 (×7): 50 mg via ORAL
  Filled 2022-05-21 (×7): qty 1

## 2022-05-21 MED ORDER — GABAPENTIN 400 MG PO CAPS
400.0000 mg | ORAL_CAPSULE | Freq: Every day | ORAL | Status: DC
  Filled 2022-05-21: qty 1

## 2022-05-21 MED ORDER — DICYCLOMINE HCL 10 MG PO CAPS
10.0000 mg | ORAL_CAPSULE | Freq: Three times a day (TID) | ORAL | Status: DC | PRN
  Administered 2022-05-24 – 2022-05-27 (×6): 10 mg via ORAL
  Filled 2022-05-21 (×6): qty 1

## 2022-05-21 MED ORDER — ROSUVASTATIN CALCIUM 5 MG PO TABS
5.0000 mg | ORAL_TABLET | Freq: Every day | ORAL | Status: DC
  Administered 2022-05-21 – 2022-05-27 (×7): 5 mg via ORAL
  Filled 2022-05-21 (×7): qty 1

## 2022-05-21 MED ORDER — ACETAMINOPHEN 500 MG PO TABS
1000.0000 mg | ORAL_TABLET | Freq: Every evening | ORAL | Status: DC
  Administered 2022-05-21 – 2022-05-27 (×7): 1000 mg via ORAL
  Filled 2022-05-21 (×7): qty 2

## 2022-05-21 MED ORDER — MONTELUKAST SODIUM 10 MG PO TABS
10.0000 mg | ORAL_TABLET | Freq: Every day | ORAL | Status: DC
  Administered 2022-05-21 – 2022-05-27 (×7): 10 mg via ORAL
  Filled 2022-05-21 (×7): qty 1

## 2022-05-21 MED ORDER — LEVOTHYROXINE SODIUM 75 MCG PO TABS
75.0000 ug | ORAL_TABLET | Freq: Every morning | ORAL | Status: DC
  Administered 2022-05-22 – 2022-05-28 (×7): 75 ug via ORAL
  Filled 2022-05-21 (×7): qty 1

## 2022-05-21 MED ORDER — APIXABAN 2.5 MG PO TABS
2.5000 mg | ORAL_TABLET | Freq: Two times a day (BID) | ORAL | Status: DC
  Administered 2022-05-21 – 2022-05-28 (×14): 2.5 mg via ORAL
  Filled 2022-05-21 (×14): qty 1

## 2022-05-21 MED ORDER — INSULIN GLARGINE-YFGN 100 UNIT/ML ~~LOC~~ SOLN
10.0000 [IU] | Freq: Every evening | SUBCUTANEOUS | Status: DC
  Administered 2022-05-22 – 2022-05-27 (×6): 10 [IU] via SUBCUTANEOUS
  Filled 2022-05-21 (×8): qty 0.1

## 2022-05-21 MED ORDER — ROSUVASTATIN CALCIUM 5 MG PO TABS: 5.0000 mg | ORAL_TABLET | Freq: Every day | ORAL | Status: DC

## 2022-05-21 MED ORDER — TRAMADOL HCL 50 MG PO TABS
50.0000 mg | ORAL_TABLET | Freq: Every day | ORAL | Status: DC | PRN
  Administered 2022-05-24 – 2022-05-27 (×4): 50 mg via ORAL
  Filled 2022-05-21 (×4): qty 1

## 2022-05-21 MED ORDER — FUROSEMIDE 10 MG/ML IJ SOLN
80.0000 mg | Freq: Once | INTRAMUSCULAR | Status: AC
  Administered 2022-05-21: 80 mg via INTRAVENOUS
  Filled 2022-05-21: qty 8

## 2022-05-21 MED ORDER — VITAMIN D 25 MCG (1000 UNIT) PO TABS
1000.0000 [IU] | ORAL_TABLET | Freq: Every day | ORAL | Status: DC
  Administered 2022-05-22 – 2022-05-28 (×7): 1000 [IU] via ORAL
  Filled 2022-05-21 (×7): qty 1

## 2022-05-21 MED ORDER — CYCLOSPORINE 0.05 % OP EMUL
1.0000 [drp] | Freq: Two times a day (BID) | OPHTHALMIC | Status: DC
  Administered 2022-05-25 – 2022-05-27 (×5): 1 [drp] via OPHTHALMIC
  Filled 2022-05-21 (×16): qty 30

## 2022-05-21 MED ORDER — PANTOPRAZOLE SODIUM 40 MG PO TBEC
40.0000 mg | DELAYED_RELEASE_TABLET | Freq: Every day | ORAL | Status: DC
  Administered 2022-05-22 – 2022-05-28 (×7): 40 mg via ORAL
  Filled 2022-05-21 (×7): qty 1

## 2022-05-21 MED ORDER — INSULIN ASPART 100 UNIT/ML IJ SOLN
0.0000 [IU] | Freq: Three times a day (TID) | INTRAMUSCULAR | Status: DC
  Administered 2022-05-22: 1 [IU] via SUBCUTANEOUS
  Administered 2022-05-22 (×2): 3 [IU] via SUBCUTANEOUS
  Administered 2022-05-23: 2 [IU] via SUBCUTANEOUS
  Administered 2022-05-23: 3 [IU] via SUBCUTANEOUS
  Administered 2022-05-23: 2 [IU] via SUBCUTANEOUS
  Administered 2022-05-24: 1 [IU] via SUBCUTANEOUS
  Administered 2022-05-24 – 2022-05-25 (×5): 2 [IU] via SUBCUTANEOUS
  Administered 2022-05-26: 1 [IU] via SUBCUTANEOUS
  Administered 2022-05-26: 2 [IU] via SUBCUTANEOUS
  Administered 2022-05-26: 1 [IU] via SUBCUTANEOUS
  Administered 2022-05-27: 2 [IU] via SUBCUTANEOUS
  Administered 2022-05-27: 1 [IU] via SUBCUTANEOUS
  Administered 2022-05-28 (×2): 2 [IU] via SUBCUTANEOUS

## 2022-05-21 MED ORDER — GABAPENTIN 100 MG PO CAPS: 100.0000 mg | ORAL_CAPSULE | Freq: Every day | ORAL | Status: DC

## 2022-05-21 MED ORDER — GABAPENTIN 100 MG PO CAPS
100.0000 mg | ORAL_CAPSULE | Freq: Every day | ORAL | Status: DC
  Administered 2022-05-21 – 2022-05-27 (×7): 100 mg via ORAL
  Filled 2022-05-21 (×7): qty 1

## 2022-05-21 MED ORDER — ROPINIROLE HCL 1 MG PO TABS
0.5000 mg | ORAL_TABLET | Freq: Every day | ORAL | Status: DC
  Administered 2022-05-21 – 2022-05-27 (×7): 0.5 mg via ORAL
  Filled 2022-05-21 (×7): qty 1

## 2022-05-21 NOTE — Progress Notes (Signed)
I have called and spoke with Tricia Gonzales, since her renal function worsened again, will hold off on the extra tablet of torsemide I mentioned yesterday, instead, I instructed the patient to hold today's dose of torsemide and resume tomorrow at 29m daily (instead of 458m. Repeat BMET in 1 week

## 2022-05-21 NOTE — Progress Notes (Signed)
I explained to the patient during office visit that we recommend keep at the current dose.

## 2022-05-21 NOTE — Telephone Encounter (Signed)
I have reviewed the patient's recent work up with her primary cardiologist Dr. Audie Box. She remain weak and volume overloaded, any attempt at diuresing her causes Cr to go up. Suspect cardiorenal. Dr. Audie Box recommended direct admission to the hospital to tune her up. I have called the patient, she is very hesitant to go to ED however open to the idea of direct admission and heart failure management while inpatient. I have called bed control to bring her in. She is aware that it is unlikely for her to get a bed tonight or even tomorrow.

## 2022-05-21 NOTE — H&P (Addendum)
Cardiology Admission History and Physical   Patient ID: Tricia Gonzales MRN: [id number]; DOB: 1941/04/12   Admission date: 05/21/2022  PCP:  Carol Ada, [place] [facility] Cardiologist:  Evalina Field, MD  Electrophysiologist:  Cristopher Peru, MD       Chief Complaint:  increased fluid, fatigue  Patient Profile:   Tricia Gonzales is a 81 y.o. female with PAF and CHB s/p PPM, chronic systolic heart failure with mod/severe MR, severe TR, HTN, HLD, DM2, and CKD stage IV who is being seen 05/21/2022 for the evaluation of acute on chronic systolic heart failure.  History of Present Illness:   Tricia Gonzales a 81 y.o. female with PAF and CHB s/p PPM, NICM with chronic systolic heart failure and mod/severe MR, severe TR, HTN, HLD, DM2, hypothyroidism and CKD stage IV who is being seen 05/21/2022 for the evaluation of acute on chronic systolic heart failure.  Briefly, patient was seen at her outpatient cardiology office yesterday with complaints of persistent fatigue and volume overload.  She had recently increased her Coreg to 18.75 mg twice daily for control of ectopic atrial tachycardias that were thought to be contributing to worsening cardiomyopathy.  Dates that she has been taking 40 mg of her torsemide daily without missed doses but does not feel like her urine output has responded.  In addition she noticed that her weight continues to increase despite diuretic doses.  She does state that she has been eating more processed foods that could have increased salt given decreased appetite over the last few months.  Review of systems notable for paroxysmal nocturnal dyspnea, lower extremity edema, dyspnea on exertion, fatigue.  She denies worsening chest pain, palpitations, syncope, fevers, or chills.  Labs resulting from her outpatient visit demonstrated acute on chronic kidney injury with creatinine of 2.4 from 1.85 (baseline appears to be around 1.8), BUN is elevated to 76.   Sodium 132.  Given worsening renal function in the setting of volume overloaded, she called that she should go to the ED to be evaluated for IV diuresis however she preferred for direct admission.  She is admitted for acute on chronic systolic heart failure and IV diuresis in the setting of cardiorenal syndrome.  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   Anxiety    Arthritis    Atrial fibrillation ([place])    Avascular necrosis (HCC) hip left   leg pain also   Chronic back pain    Chronic kidney disease (CKD), stage III (moderate) (HCC)    Depression    Diverticulosis    Early cataracts, bilateral    Esophageal stricture    Gastritis    GAVE (gastric antral vascular ectasia)    GERD (gastroesophageal reflux disease)    Glaucoma    Heart murmur     " some doctors say that I have one some say that I don"t "   Hiatal hernia    History of blood transfusion    "@ least w/1st knee OR"   History of gout    History of kidney stones    Hyperlipidemia    Hypertension    Intestinal obstruction ([place])    Kidney stones    Obesity    Osteoarthritis    Osteoporosis    Pericarditis 2016   Presence of permanent cardiac pacemaker    Sjogren's disease ([place])    Thyroid disease    Type  II diabetes mellitus ([place])    Vitamin B12 deficiency     Past Surgical History:  Procedure Laterality Date   ABDOMINAL HYSTERECTOMY     complete   CARDIOVERSION N/A 07/31/2020   Procedure: CARDIOVERSION;  Surgeon: Werner Lean, MD;  Location: [facility];  Service: Cardiovascular;  Laterality: N/A;   CHOLECYSTECTOMY OPEN     COLECTOMY     for rectovaginal fistula   COLONOSCOPY  01/07/2012   Procedure: COLONOSCOPY;  Surgeon: Lafayette Dragon, MD;  Location: [facility];  Service: Endoscopy;  Laterality: N/A;   EP IMPLANTABLE DEVICE N/A 06/18/2015   Procedure: Pacemaker Implant;  Surgeon: Evans Lance, MD;  Location: [facility];   Service: Cardiovascular;  Laterality: N/A;   ESOPHAGEAL DILATION  08/17/2020   Procedure: ESOPHAGEAL DILATION;  Surgeon: Lavena Bullion, DO;  Location: [facility];  Service: Gastroenterology;;   ESOPHAGOGASTRODUODENOSCOPY N/A 11/24/2018   Procedure: ESOPHAGOGASTRODUODENOSCOPY (EGD);  Surgeon: Yetta Flock, MD;  Location: [facility];  Service: Gastroenterology;  Laterality: N/A;   ESOPHAGOGASTRODUODENOSCOPY (EGD) WITH ESOPHAGEAL DILATION     ESOPHAGOGASTRODUODENOSCOPY (EGD) WITH PROPOFOL N/A 08/12/2015   Procedure: ESOPHAGOGASTRODUODENOSCOPY (EGD) WITH PROPOFOL;  Surgeon: Manus Gunning, MD;  Location: [place];  Service: Gastroenterology;  Laterality: N/A;   ESOPHAGOGASTRODUODENOSCOPY (EGD) WITH PROPOFOL N/A 08/17/2020   Procedure: ESOPHAGOGASTRODUODENOSCOPY (EGD) WITH PROPOFOL;  Surgeon: Lavena Bullion, DO;  Location: [facility];  Service: Gastroenterology;  Laterality: N/A;   EYE SURGERY Bilateral    cataracts   HOT HEMOSTASIS N/A 11/24/2018   Procedure: HOT HEMOSTASIS (ARGON PLASMA COAGULATION/BICAP);  Surgeon: Yetta Flock, MD;  Location: [facility];  Service: Gastroenterology;  Laterality: N/A;   HOT HEMOSTASIS N/A 08/17/2020   Procedure: HOT HEMOSTASIS (ARGON PLASMA COAGULATION/BICAP);  Surgeon: Lavena Bullion, DO;  Location[facility];  Service: Gastroenterology;  Laterality: N/A;   I & D EXTREMITY Right 08/06/2016   Procedure: DEBRIDEMENT PIP RIGHT RING FINGER;  Surgeon: Daryll Brod, MD;  Location: [place];  Service: Orthopedics;  Laterality: Right;   INSERT / REPLACE / REMOVE PACEMAKER     JOINT REPLACEMENT     KNEE ARTHROSCOPY Right    MASS EXCISION Right 08/06/2016   Procedure: EXCISION CYST;  Surgeon: Daryll Brod, MD;  Location: [place];  Service: Orthopedics;  Laterality: Right;   REVISION TOTAL KNEE ARTHROPLASTY Left    TOTAL KNEE ARTHROPLASTY Bilateral      Medications Prior to Admission: Prior to Admission medications   Medication Sig  Start Date End Date Taking? Authorizing Provider  ACCU-CHEK AVIVA PLUS test strip 1 each by Other route daily.  03/15/15   [provider]  acetaminophen (TYLENOL) 650 MG CR tablet Take 1,300 mg by mouth every evening.    [provider]  BD PEN NEEDLE NANO U/F 32G X 4 MM MISC See admin instructions. 12/25/19   [provider]  carvedilol (COREG) 12.5 MG tablet Take 1 1/2 tablet ( 18.75 MG ) by mouth twice a day 04/16/22   Evans Lance, MD  Cholecalciferol (VITAMIN D3) 25 MCG (1000 UT) CAPS Take 1,000 Units by mouth daily.    [provider]  cycloSPORINE (RESTASIS) 0.05 % ophthalmic emulsion Place 1 drop into both eyes 2 (two) times daily. 10/30/20   [provider]  dicyclomine (BENTYL) 10 MG capsule Take 1 capsule (10 mg total) by mouth every 8 (eight) hours as needed for spasms. 10/07/21   Armbruster, Carlota Raspberry, MD  ELIQUIS 2.5 MG TABS tablet TAKE  ONE TABLET TWICE DAILY 02/09/22   O'Neal, Cassie Freer, MD  febuxostat (ULORIC) 40 MG tablet Take 1 tablet (40 mg total) by mouth every Monday, Wednesday, and Friday. 05/12/19   [place] Lewis C, PA-C  ferrous sulfate 325 (65 FE) MG EC tablet Take 1 tablet (325 mg total) by mouth 2 (two) times daily. 09/27/15   Ladell Pier, MD  gabapentin (NEURONTIN) 400 MG capsule Take 1 capsule (400 mg total) by mouth at bedtime. 05/12/19   Wille Celeste, PA-C  levothyroxine (SYNTHROID) 75 MCG tablet Take 75 mcg by mouth every morning. 04/14/21   [provider]  lidocaine (LIDODERM) 5 % Place 1-3 patches onto the skin See admin instructions. Apply 1-3 patches transdermally every 24 hours as needed for pain and remove & discard patches within 12 hours or as directed by MD    [provider]  montelukast (SINGULAIR) 10 MG tablet Take 1 tablet (10 mg total) by mouth at bedtime. 05/12/19   [place] Lewis C, PA-C  nystatin [facility]) powder APPLY TOPICALLY TO LEFT ABDOMINAL FOLD AND GROIN TWICE A DAY 05/12/19    [place] Lewis C, PA-C  ondansetron (ZOFRAN-ODT) 4 MG disintegrating tablet Take 1 tablet (4 mg total) by mouth every 8 (eight) hours as needed for nausea or vomiting. 11/11/21   Armbruster, Carlota Raspberry, MD  pantoprazole (PROTONIX) 40 MG tablet Take 40 mg by mouth daily.    [provider]  pentoxifylline (TRENTAL) 400 MG CR tablet TAKE ONE TABLET EACH DAY WITH A MEAL    [provider]  Polyethylene Glycol 3350 (MIRALAX PO) Take 17 g by mouth in the morning and at bedtime. Patient not taking: Reported on 05/20/2022    [provider]  rOPINIRole (REQUIP) 0.5 MG tablet Take 0.5 mg by mouth at bedtime. 02/23/20   [provider]  rosuvastatin (CRESTOR) 5 MG tablet Take 1 tablet (5 mg total) by mouth daily. 05/12/19   [place] Lewis C, PA-C  sennosides-docusate sodium (SENOKOT-S) 8.6-50 MG tablet 1 tablet    [provider]  SF 5000 PLUS 1.1 % CREA dental cream Take by mouth as directed. 12/13/21   [provider]  SIMBRINZA 1-0.2 % SUSP Apply 1 drop to eye 2 (two) times daily. 03/24/22   [provider]  torsemide (DEMADEX) 20 MG tablet Take 2 tablets (40 mg total) by mouth daily. 03/27/22   Almyra Deforest, PA  traMADol (ULTRAM) 50 MG tablet Take 50 mg by mouth See admin instructions. Take 50 mg by mouth at bedtime and an additional 50 mg once a day as needed for pain 02/13/20   [provider]  TRESIBA FLEXTOUCH 100 UNIT/ML FlexTouch Pen Inject 10 Units into the skin every evening. 02/21/20   [provider]     Allergies:    Allergies  Allergen Reactions   Norvasc [Amlodipine Besylate] Shortness Of Breath   Other Rash and Other (See Comments)    Pt voiced she is allergic to microfiber materials/ blankets/sheets!!   Amlodipine Besylate     Other reaction(s): SOB   Avapro [Irbesartan] Other (See Comments)    Headaches   Glucophage [Metformin Hydrochloride] Other (See Comments)    "increases creatinine" and diarrhea   Lisinopril      Headache    Losartan Other (See Comments)    Headaches    Morphine And Related Other (See Comments)    "Hallucinations"   Simvastatin Other (See Comments)    Body aches   Trulicity [Dulaglutide] Other (  See Comments)    Severe mood swings    Social History:   Social History   Socioeconomic History   Marital status: Divorced    Spouse name: Not on file   Number of children: 2   Years of education: Not on file   Highest education level: Not on file  Occupational History   Occupation: retired  Tobacco Use   Smoking status: Former    Packs/day: 2.00    Years: 3.00    Total pack years: 6.00    Types: Cigarettes    Quit date: 06/23/1983    Years since quitting: 38.9    Passive exposure: Never   Smokeless tobacco: Never  Vaping Use   Vaping Use: Never used  Substance and Sexual Activity   Alcohol use: Yes    Alcohol/week: 2.0 standard drinks of alcohol    Types: 2 Glasses of wine per week    Comment: occ   Drug use: No   Sexual activity: Not Currently  Other Topics Concern   Not on file  Social History Narrative   Not on file   Social Determinants of Health   Financial Resource Strain: Not on file  Food Insecurity: Not on file  Transportation Needs: Not on file  Physical Activity: Not on file  Stress: Not on file  Social Connections: Not on file  Intimate Partner Violence: Not on file    Family History:   The patient's family history includes Diabetes in her brother and mother; Hypertension in her sister. There is no history of Colon cancer, Heart attack, Stroke, Colon polyps, Esophageal cancer, Rectal cancer, or Stomach cancer.    ROS:  Please see the history of present illness.  All other ROS reviewed and negative.     Physical Exam/Data:   Vitals:   05/21/22 2010  BP: 139/69  Pulse: 64  Resp: 18  Temp: 98.2 F (36.8 C)  TempSrc: Oral  SpO2: 93%  Weight: 86.9 kg  Height: _0  (1.473 m)   No intake or output data in the 24 hours ending  05/21/22 2111    05/21/2022    8:10 PM 05/20/2022    2:40 PM 05/19/2022   12:00 PM  Last 3 Weights  Weight (lbs) 191 lb 9.6 oz 194 lb 198 lb 6.4 oz  Weight (kg) 86.909 kg 87.998 kg 89.994 kg     Body mass index is 40.04 kg/m.  General:  Well nourished, well developed, in no acute distress HEENT: normal Neck: JVP 15 mmHg Vascular: No carotid bruits; Distal pulses 2+ bilaterally   Cardiac:  normal S1, S2; RRR; 4/6 holosystolic murmur at apex Lungs:  bibasilar crackles otherwise clear to auscultation bilaterally, no wheezing, rhonchi  Abd: soft, nontender, no hepatomegaly  Ext: 2+ pitting edema Musculoskeletal:  No deformities, BUE and BLE strength normal and equal Skin: warm and dry  Neuro:  CNs 2-12 intact, no focal abnormalities noted Psych:  Normal affect   EKG:  pending  Relevant CV Studies: The following studies were reviewed today:   Echo 03/20/2022  1. Left ventricular ejection fraction, by estimation, is 25 to 30%. The  left ventricle has severely decreased function. The left ventricle  demonstrates global hypokinesis. There is mild left ventricular  hypertrophy. Left ventricular diastolic parameters   are indeterminate.   2. Right ventricular systolic function is moderately reduced. The right  ventricular size is moderately enlarged. There is mildly elevated  pulmonary artery systolic pressure. The estimated right ventricular  systolic pressure is  41.8 mmHg.   3. Left atrial size was moderately dilated.   4. Right atrial size was mild to moderately dilated.   5. The mitral valve is degenerative. Moderate to severe mitral valve  regurgitation with significant splay artifact and eccentric jet  posteriorly directed, may be underestimated. Moderate MAC. No evidence of  mitral stenosis.   6. Tricuspid valve regurgitation is severe.   7. The aortic valve is tricuspid. There is mild calcification of the  aortic valve. There is moderate thickening of the aortic valve.  Aortic  valve regurgitation is trivial. No aortic stenosis is present.   8. The inferior vena cava is dilated in size with <50% respiratory  variability, suggesting right atrial pressure of 15 mmHg.   Comparison(s): A prior study was performed on 08/16/20. Changes from prior  study are noted.    Limited echo 05/11/2022  1. Left ventricular ejection fraction, by estimation, is 30 to 35%. The  left ventricle demonstrates global hypokinesis. There is mild concentric  left ventricular hypertrophy. Left ventricular diastolic function could  not be evaluated.   2. Right ventricular systolic function is moderately reduced. The right  ventricular size is moderately enlarged. There is severely elevated  pulmonary artery systolic pressure.   3. The mitral valve is normal in structure. Moderate to severe mitral  valve regurgitation. No evidence of mitral stenosis. Moderate mitral  annular calcification.   4. Tricuspid valve regurgitation is severe.   5. The aortic valve is normal in structure. There is mild calcification  of the aortic valve. Aortic valve regurgitation is not visualized. Aortic  valve sclerosis is present, with no evidence of aortic valve stenosis.   6. The inferior vena cava is normal in size with greater than 50%  respiratory variability, suggesting right atrial pressure of 3 mmHg.   Comparison(s): 03/20/22 EF 25-30%. PA pressure 53mHg. Moderate-severe MR.  RV moderately enlarged.    04/02/2022: NT-Pro BNP 691,47811/28/2023: Hemoglobin 9.9; Platelet Count 165 05/20/2022: BUN 76; Creatinine, Ser 2.41; Potassium 5.1; Sodium 143   Laboratory Data:  High Sensitivity Troponin:  No results for input(s): "TROPONINIHS" in the last 720 hours.    Chemistry Recent Labs  Lab 05/20/22 1557  NA 143  K 5.1  CL 99  CO2 30*  GLUCOSE 132*  BUN 76*  CREATININE 2.41*  CALCIUM 9.5    No results for input(s): "PROT", "ALBUMIN", "AST", "ALT", "ALKPHOS", "BILITOT" in the last 168  hours. Lipids No results for input(s): "CHOL", "TRIG", "HDL", "LABVLDL", "LDLCALC", "CHOLHDL" in the last 168 hours. Hematology Recent Labs  Lab 05/19/22 1146  WBC 4.9  RBC 3.15*  HGB 9.9*  HCT 31.9*  MCV 101.3*  MCH 31.4  MCHC 31.0  RDW 16.4*  PLT 165   Thyroid No results for input(s): "TSH", "FREET4" in the last 168 hours. BNPNo results for input(s): "BNP", "PROBNP" in the last 168 hours.  DDimer No results for input(s): "DDIMER" in the last 168 hours.   Radiology/Studies:  No results found.   Assessment and Plan:   Acute on chronic systolic heart failure  Evidence of volume overload on exam.  Likely multifactorial but suspect her worsening cardiomyopathy is in part due to moderate to severe mitral regurgitation in conjunction with paroxysmal atrial tachycardias, more recent worsening dietary discretion.  Device interrogated last on 11/7 so this can be updated in the morning to see her burden of tachyarrhythmia although she appears to be fairly well-controlled with her increased beta-blocker dosage.  Fortunately she is warm  and fairly compensated on exam but is considerably volume overloaded with lower extremity edema and elevated JVP (albeit in the setting of severe TR).  Plan is to update labs and electrolytes and trial IV diuresis with close monitoring of her renal function. -Will follow-up creatinine and electrolytes -Plan to dose with IV Lasix 80 mg with close monitoring of renal and electrolyte function -Order for iron studies -Repeat device interrogation in the morning to assess tachyarrhythmia burden -Consider initiation of an SGLT2 inhibitor pending renal function -Given no evidence of shock and to continue atrial tachyarrhythmia control we will continue her Coreg with ongoing IV diuresis - allergies listed to ARB and ACEi  2.  Paroxysmal A-fib and complete heart block status post pacemaker -Continue home Eliquis and beta-blocker  3.  Hypertension, hyperlipidemia,  DM2 -Continue home medication regimen   Risk Assessment/Risk Scores:     New York Heart Association (NYHA) Functional Class NYHA Class III  CHA2DS2-VASc Score = 5   This indicates a 7.2% annual risk of stroke. The patient's score is based upon: CHF History: 0 HTN History: 1 Diabetes History: 1 Stroke History: 0 Vascular Disease History: 0 Age Score: 2 Gender Score: 1      Severity of Illness: The appropriate patient status for this patient is INPATIENT. Inpatient status is judged to be reasonable and necessary in order to provide the required intensity of service to ensure the patient's safety. The patient's presenting symptoms, physical exam findings, and initial radiographic and laboratory data in the context of their chronic comorbidities is felt to place them at high risk for further clinical deterioration. Furthermore, it is not anticipated that the patient will be medically stable for discharge from the hospital within 2 midnights of admission.   * I certify that at the point of admission it is my clinical judgment that the patient will require inpatient hospital care spanning beyond 2 midnights from the point of admission due to high intensity of service, high risk for further deterioration and high frequency of surveillance required.*   For questions or updates, please contact [place] Please consult [email or web address] for contact info under   Signed, Silas Flood, MD  05/21/2022 9:11 PM

## 2022-05-22 ENCOUNTER — Encounter (HOSPITAL_COMMUNITY): Payer: Self-pay | Admitting: Cardiovascular Disease

## 2022-05-22 ENCOUNTER — Other Ambulatory Visit (HOSPITAL_COMMUNITY): Payer: Self-pay

## 2022-05-22 ENCOUNTER — Other Ambulatory Visit: Payer: Self-pay

## 2022-05-22 ENCOUNTER — Other Ambulatory Visit: Payer: Self-pay | Admitting: Nurse Practitioner

## 2022-05-22 ENCOUNTER — Inpatient Hospital Stay (HOSPITAL_COMMUNITY): Payer: Medicare Other

## 2022-05-22 DIAGNOSIS — N179 Acute kidney failure, unspecified: Secondary | ICD-10-CM

## 2022-05-22 DIAGNOSIS — I5021 Acute systolic (congestive) heart failure: Secondary | ICD-10-CM | POA: Diagnosis not present

## 2022-05-22 DIAGNOSIS — N184 Chronic kidney disease, stage 4 (severe): Secondary | ICD-10-CM

## 2022-05-22 DIAGNOSIS — I340 Nonrheumatic mitral (valve) insufficiency: Secondary | ICD-10-CM | POA: Diagnosis not present

## 2022-05-22 DIAGNOSIS — I071 Rheumatic tricuspid insufficiency: Secondary | ICD-10-CM

## 2022-05-22 DIAGNOSIS — E118 Type 2 diabetes mellitus with unspecified complications: Secondary | ICD-10-CM

## 2022-05-22 DIAGNOSIS — I480 Paroxysmal atrial fibrillation: Secondary | ICD-10-CM

## 2022-05-22 DIAGNOSIS — Z794 Long term (current) use of insulin: Secondary | ICD-10-CM

## 2022-05-22 DIAGNOSIS — I495 Sick sinus syndrome: Secondary | ICD-10-CM

## 2022-05-22 LAB — GLUCOSE, CAPILLARY
Glucose-Capillary: 203 mg/dL — ABNORMAL HIGH (ref 70–99)
Glucose-Capillary: 136 mg/dL — ABNORMAL HIGH (ref 70–99)
Glucose-Capillary: 236 mg/dL — ABNORMAL HIGH (ref 70–99)
Glucose-Capillary: 214 mg/dL — ABNORMAL HIGH (ref 70–99)

## 2022-05-22 LAB — BASIC METABOLIC PANEL
Anion gap: 6 (ref 5–15)
BUN: 82 mg/dL — ABNORMAL HIGH (ref 8–23)
CO2: 29 mmol/L (ref 22–32)
Calcium: 8.8 mg/dL — ABNORMAL LOW (ref 8.9–10.3)
Chloride: 105 mmol/L (ref 98–111)
Creatinine, Ser: 2.61 mg/dL — ABNORMAL HIGH (ref 0.44–1.00)
GFR, Estimated: 18 mL/min — ABNORMAL LOW (ref >60)
Glucose, Bld: 236 mg/dL — ABNORMAL HIGH (ref 70–99)
Potassium: 4.1 mmol/L (ref 3.5–5.1)
Sodium: 140 mmol/L (ref 135–145)

## 2022-05-22 LAB — FERRITIN: Ferritin: 37 ng/mL (ref 11–307)

## 2022-05-22 LAB — TSH: TSH: 2.716 u[IU]/mL (ref 0.350–4.500)

## 2022-05-22 LAB — CBC
HCT: 28.7 % — ABNORMAL LOW (ref 36.0–46.0)
Hemoglobin: 9.1 g/dL — ABNORMAL LOW (ref 12.0–15.0)
MCH: 31.9 pg (ref 26.0–34.0)
MCHC: 31.7 g/dL (ref 30.0–36.0)
MCV: 100.7 fL — ABNORMAL HIGH (ref 80.0–100.0)
Platelets: 139 10*3/uL — ABNORMAL LOW (ref 150–400)
RBC: 2.85 MIL/uL — ABNORMAL LOW (ref 3.87–5.11)
RDW: 16.3 % — ABNORMAL HIGH (ref 11.5–15.5)
WBC: 5.8 10*3/uL (ref 4.0–10.5)
nRBC: 0.3 % — ABNORMAL HIGH (ref 0.0–0.2)

## 2022-05-22 LAB — IRON AND TIBC
Iron: 34 ug/dL (ref 28–170)
Saturation Ratios: 10 % — ABNORMAL LOW (ref 10.4–31.8)
TIBC: 333 ug/dL (ref 250–450)
UIBC: 299 ug/dL

## 2022-05-22 LAB — MAGNESIUM: Magnesium: 2.2 mg/dL (ref 1.7–2.4)

## 2022-05-22 MED ORDER — HYDRALAZINE HCL 25 MG PO TABS
25.0000 mg | ORAL_TABLET | Freq: Three times a day (TID) | ORAL | Status: DC
  Administered 2022-05-22 – 2022-05-28 (×20): 25 mg via ORAL
  Filled 2022-05-22 (×20): qty 1

## 2022-05-22 MED ORDER — ONDANSETRON 4 MG PO TBDP
4.0000 mg | ORAL_TABLET | Freq: Three times a day (TID) | ORAL | Status: DC | PRN
  Administered 2022-05-22 – 2022-05-28 (×8): 4 mg via ORAL
  Filled 2022-05-22 (×10): qty 1

## 2022-05-22 MED ORDER — ISOSORBIDE MONONITRATE ER 30 MG PO TB24
30.0000 mg | ORAL_TABLET | Freq: Every day | ORAL | Status: DC
  Administered 2022-05-22 – 2022-05-28 (×7): 30 mg via ORAL
  Filled 2022-05-22 (×7): qty 1

## 2022-05-22 MED ORDER — FUROSEMIDE 10 MG/ML IJ SOLN
80.0000 mg | Freq: Two times a day (BID) | INTRAMUSCULAR | Status: DC
  Administered 2022-05-22 – 2022-05-24 (×5): 80 mg via INTRAVENOUS
  Filled 2022-05-22 (×5): qty 8

## 2022-05-22 MED ORDER — METOLAZONE 5 MG PO TABS
5.0000 mg | ORAL_TABLET | Freq: Once | ORAL | Status: AC
  Administered 2022-05-22: 5 mg via ORAL
  Filled 2022-05-22: qty 1

## 2022-05-22 NOTE — Progress Notes (Signed)
Heart Failure Nurse Navigator Progress Note  PCP: Carol Ada, MD PCP-Cardiologist: [place] Admission Diagnosis: Acute on chronic systolic heart failure Admitted from: Home via PCP via Direct Admit  Presentation:   Tricia Gonzales presented with persistent fatigue and volume overload, saw Hematologist on 11/28, anemia secondary to chronic renal insuffiencey, last month saw her cardiologist her carvedilol was increased and she was referred to HF at this time, however patient didn't want to move forward with that during that time, her renal function worsened and she became more fluid overloaded. BP 139/69, HR 64, 2+ edema, BNP 2,897. IV lasix given.   Patient was educated on the sign and symptoms of heart failure, daily weights, when to call her doctor or go to the ED. Diet/ fluid restrictions, taking all her medications as prescribed and attending all medical appointments, Patient verbalized her understanding, and a hospital follow up was scheduled for 06/02/22 @ 12 noon.   ECHO/ LVEF: 30-35%  Clinical Course:  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   Anxiety    Arthritis    Atrial fibrillation (HCC)    Avascular necrosis (HCC) hip left   leg pain also   Chronic back pain    Chronic kidney disease (CKD), stage III (moderate) (HCC)    Depression    Diverticulosis    Early cataracts, bilateral    Esophageal stricture    Gastritis    GAVE (gastric antral vascular ectasia)    GERD (gastroesophageal reflux disease)    Glaucoma    Heart murmur     " some doctors say that I have one some say that I don"t "   Hiatal hernia    History of blood transfusion    "@ least w/1st knee OR"   History of gout    History of kidney stones    Hyperlipidemia    Hypertension    Intestinal obstruction ([place])    Kidney stones    Obesity    Osteoarthritis    Osteoporosis    Pericarditis 2016   Presence of permanent cardiac pacemaker     Sjogren's disease ([place])    Thyroid disease    Type II diabetes mellitus ([place])    Vitamin B12 deficiency      Social History   Socioeconomic History   Marital status: Divorced    Spouse name: Not on file   Number of children: 2   Years of education: Not on file   Highest education level: High school graduate  Occupational History   Occupation: retired  Tobacco Use   Smoking status: Former    Packs/day: 2.00    Years: 3.00    Total pack years: 6.00    Types: Cigarettes    Quit date: 06/23/1983    Years since quitting: 38.9    Passive exposure: Never   Smokeless tobacco: Never  Vaping Use   Vaping Use: Never used  Substance and Sexual Activity   Alcohol use: Yes    Alcohol/week: 2.0 standard drinks of alcohol    Types: 2 Glasses of wine per week    Comment: occ   Drug use: No   Sexual activity: Not Currently  Other Topics Concern   Not on file  Social History Narrative   Not on file   Social Determinants of Health   Financial Resource Strain: Low Risk  (05/22/2022)   Overall Financial Resource Strain (CARDIA)    Difficulty of Paying  Living Expenses: Not hard at all  Food Insecurity: No Food Insecurity (05/21/2022)   Hunger Vital Sign    Worried About Running Out of Food in the Last Year: Never true    Ran Out of Food in the Last Year: Never true  Transportation Needs: No Transportation Needs (05/22/2022)   PRAPARE - Hydrologist (Medical): No    Lack of Transportation (Non-Medical): No  Physical Activity: Not on file  Stress: Not on file  Social Connections: Not on file   Education Assessment and Provision:  Detailed education and instructions provided on heart failure disease management including the following:  Signs and symptoms of Heart Failure When to call the physician Importance of daily weights Low sodium diet Fluid restriction Medication management Anticipated future follow-up appointments  Patient education given on  each of the above topics.  Patient acknowledges understanding via teach back method and acceptance of all instructions.  Education Materials:  "Living Better With Heart Failure" Booklet, HF zone tool, & Daily Weight Tracker Tool.  Patient has scale at home: yes Patient has pill box at home:   s  High Risk Criteria for Readmission and/or Poor Patient Outcomes: Heart failure hospital admissions (last 6 months): 0  No Show rate: 2 % Difficult social situation: No Demonstrates medication adherence: Yes Primary Language: English Literacy level: reading, writing, and comprehension  Barriers of Care:   Diet/ fluids ( soda)  CKD IV Needs pacer upgrade  Considerations/Referrals:   Referral made to Heart Failure Pharmacist Stewardship: yes Referral made to Heart Failure CSW/NCM TOC: No Referral made to Heart & Vascular TOC clinic: Yes, 06/02/22 @ 12 noon  Items for Follow-up on DC/TOC: Diet/ fluid restrictions ( soda) CKD IV Needs pacer upgrade   Earnestine Leys, BSN, RN Heart Failure Transport planner Only

## 2022-05-22 NOTE — Progress Notes (Signed)
Pt arrived to unit via wheelchair as direct admit. Pt A&O x4. Daughter at bedside. All questions answered. Cardiology notified of patients arrival.

## 2022-05-22 NOTE — Progress Notes (Addendum)
Rounding Note    Patient Name: Tricia Gonzales Date of Encounter: 05/22/2022  [place] Cardiologist: Evalina Field, MD   Subjective   Patient complaining of sob, abdominal distention/tightness, lack of appetite, and ankle edema.   Inpatient Medications    Scheduled Meds:  acetaminophen  1,000 mg Oral QPM   apixaban  2.5 mg Oral BID   brinzolamide  1 drop Right Eye TID   And   brimonidine  1 drop Right Eye TID   carvedilol  18.75 mg Oral BID WC   cholecalciferol  1,000 Units Oral Daily   cycloSPORINE  1 drop Both Eyes BID   febuxostat  40 mg Oral Q M,W,F   furosemide  80 mg Intravenous BID   gabapentin  100 mg Oral QHS   hydrALAZINE  25 mg Oral Q8H   insulin aspart  0-9 Units Subcutaneous TID WC   insulin glargine-yfgn  10 Units Subcutaneous QPM   levothyroxine  75 mcg Oral q morning   montelukast  10 mg Oral QHS   pantoprazole  40 mg Oral Daily   rOPINIRole  0.5 mg Oral QHS   rosuvastatin  5 mg Oral QHS   senna-docusate  2 tablet Oral Daily   traMADol  50 mg Oral QHS   Continuous Infusions:  PRN Meds: dicyclomine, ondansetron, traMADol   Vital Signs    Vitals:   05/22/22 0058 05/22/22 0059 05/22/22 0333 05/22/22 0607  BP:  133/62  (!) 140/88  Pulse:  63  68  Resp:    18  Temp:  98 F (36.7 C)  (!) 97.5 F (36.4 C)  TempSrc:  Oral  Oral  SpO2:  90%  98%  Weight: 86.9 kg 86.9 kg 87.3 kg   Height:        Intake/Output Summary (Last 24 hours) at 05/22/2022 0907 Last data filed at 05/22/2022 0400 Gross per 24 hour  Intake --  Output 800 ml  Net -800 ml      05/22/2022    3:33 AM 05/22/2022   12:59 AM 05/22/2022   12:58 AM  Last 3 Weights  Weight (lbs) 192 lb 6.4 oz 191 lb 9.6 oz 191 lb 9.6 oz  Weight (kg) 87.272 kg 86.909 kg 86.909 kg      Telemetry    AV paced rhythm - Personally Reviewed  ECG    AV dual paced rhythm, HR 69 BPM - Personally Reviewed  Physical Exam   GEN: No acute distress.  Sitting upright in the bed wearing  [place]. Appears uncomfortable  Neck: + JVD Cardiac: RRR, no murmurs, rubs, or gallops. Radial pulses 2+ bilaterally  Respiratory: Crackles and diminished breat sounds in bilateral lung bases  GI: Distended, mildly painful to palpation  MS: 2+ pitting edema in BLE; No deformity. Neuro:  Nonfocal  Psych: Normal affect   Labs    High Sensitivity Troponin:  No results for input(s): "TROPONINIHS" in the last 720 hours.   Chemistry Recent Labs  Lab 05/20/22 1557 05/21/22 2057 05/22/22 0106  NA 143 140 140  K 5.1 4.4 4.1  CL 99 103 105  CO2 30* 26 29  GLUCOSE 132* 180* 236*  BUN 76* 79* 82*  CREATININE 2.41* 2.51* 2.61*  CALCIUM 9.5 9.0 8.8*  MG  --  2.3 2.2  PROT  --  5.9*  --   ALBUMIN  --  3.4*  --   AST  --  21  --   ALT  --  17  --   ALKPHOS  --  80  --   BILITOT  --  0.9  --   GFRNONAA  --  19* 18*  ANIONGAP  --  11 6    Lipids No results for input(s): "CHOL", "TRIG", "HDL", "LABVLDL", "LDLCALC", "CHOLHDL" in the last 168 hours.  Hematology Recent Labs  Lab 05/19/22 1146 05/21/22 2057 05/22/22 0106  WBC 4.9 5.8 5.8  RBC 3.15* 3.12* 2.85*  HGB 9.9* 9.8* 9.1*  HCT 31.9* 31.8* 28.7*  MCV 101.3* 101.9* 100.7*  MCH 31.4 31.4 31.9  MCHC 31.0 30.8 31.7  RDW 16.4* 16.3* 16.3*  PLT 165 166 139*   Thyroid  Recent Labs  Lab 05/22/22 0106  TSH 2.716    BNP Recent Labs  Lab 05/21/22 2057  BNP 2,879.8*    DDimer No results for input(s): "DDIMER" in the last 168 hours.   Radiology    No results found.  Cardiac Studies   Echocardiogram 05/11/22 1. Left ventricular ejection fraction, by estimation, is 30 to 35%. The  left ventricle demonstrates global hypokinesis. There is mild concentric  left ventricular hypertrophy. Left ventricular diastolic function could  not be evaluated.   2. Right ventricular systolic function is moderately reduced. The right  ventricular size is moderately enlarged. There is severely elevated  pulmonary artery systolic pressure.    3. The mitral valve is normal in structure. Moderate to severe mitral  valve regurgitation. No evidence of mitral stenosis. Moderate mitral  annular calcification.   4. Tricuspid valve regurgitation is severe.   5. The aortic valve is normal in structure. There is mild calcification  of the aortic valve. Aortic valve regurgitation is not visualized. Aortic  valve sclerosis is present, with no evidence of aortic valve stenosis.   6. The inferior vena cava is normal in size with greater than 50%  respiratory variability, suggesting right atrial pressure of 3 mmHg.   Comparison(s): 03/20/22 EF 25-30%. PA pressure 58mHg. Moderate-severe MR.  RV moderately enlarged.   Patient Profile     81y.o. female with a past medical history of PAF and CHB s/p PPM, chronic systolic heart failure/NICM with mod/severe MR, severe TR, Htn, HLD, type 2 DM, CKD stage IV, hypothyroidism who is being seen for the evaluation of CHF  Assessment & Plan    Acute on Chronic Systolic Heart Failure  NICM  SSS s/p PPM  Atrial Tachycardia  - Most recent echocardiogram from 05/11/2022 showed EF 30-35%, mild LVH, moderately reduced RV systolic function and severely elevated pulmonary artery systolic pressure, moderate-severe MR, severe TR - Note-- patient has a PPM for sick sinus syndrome. Pacing about 88% of the time. Also noted to have had frequent episodes of ectopic atrial tachycardia on device interrogation from 03/2022. HR is better controlled now on a higher dose of carvedilol  - Suspect that reduced EF, mitral valve regurgitation are due to high pacing load and dyssynchrony, episodes of tachycardia  - Patient was seen by EP on 10/26-- recommended upgrade to a BiV PPM, but patient declined  - Patient was seen in the office on 11/29, and was noted to have ankle edema at that time. Complained of SOB, weakness, abdominal distention. Was direct admitted on 11/30  - BNP elevated to 2879.8. Patient has 2+ pitting edema,  JVD, and crackles in lung bases on exam  - Started on IV lasix 80 mg BID - Patient has baseline CKD stage IV-- creatinine 2.61 this AM. Follow renal function with daily  BMPs  - Strict I/Os, daily weights  - Continue carvedilol 18.75 mg BID (dose was increased in 03/2022 to help suppress atrial tachycardia. HR is well controlled and in the 60s this admission)  - Continue hydralazine 25 mg TID - Note on ACE/ARB/ARNI or spiro due to CKD, allergies. GFR too low for SGLT2i  Paroxysmal Atrial Fibrillation  - Continue eliquis 2.5 mg BID (dose reduced for age, renal function)   HTN - BB controlled with GDMT as above    Type 2 DM  - On home insulin   For questions or updates, please contact [place] Please consult [email or web address] for contact info under        Signed, Margie Billet, PA-C  05/22/2022, 9:07 AM

## 2022-05-22 NOTE — Progress Notes (Signed)
   Heart Failure Stewardship Pharmacist Progress Note   PCP: Carol Ada, MD PCP-Cardiologist: Evalina Field, MD    HPI:  81 y.o female with PMH significant for PAF, CHF, HTN, HLD, T2DM, CKD stage IV, anemia and hypothyroidism.  On 05/19/2022 she had a visit with her hematologist. She has anemia secondary to chronic renal insufficiency. She takes PO iron and is scheduled to receive Feraheme in 6 weeks in clinic.   On 05/20/2022 she had an appointment with the cardiologist. Her carvedilol dose was increased the month prior and she reported increased fatigue with the dose increase. She was referred to HF at that time, but she wished to not move forward with that decision. Her renal function had worsened and she continued to remain weak and volume-overloaded. She was instructed the following day to report to the hospital for management of her HF inpatient.   Upon arrival to the ED, she endorsed medication adherence, along with increased salt intake over the past few months. She is positive for LEE, dyspnea on exertion, and fatigue.  ECHO on 05/11/2022 showed EF of 30-35% with global hypokinesis, moderately reduced right ventricular systolic function, severely elevated pulmonary artery systolic pressure, severe TV regurgitation and moderate to severe MV regurgitation. Previous EF in 02/2022 was 25-30%.   Current HF Medications: Diuretic: furosemide 64m IV BID Beta Blocker: carvedilol 18.73mBID  Prior to admission HF Medications: Diuretic: torsemide 4058maily Beta blocker: carvedilol 18.79m65mD  Pertinent Lab Values: Serum creatinine 2.61, BUN 82, Potassium 4.1, Sodium 140, BNP 2,897.8, Magnesium 2.2, A1c 6.0 (07/2020)  Vital Signs: Admission weight: 191 lbs  Blood pressure: 130-40/ 60-80s Heart rate: 60s  I/O: net -0.8L  Medication Assistance / Insurance Benefits Check: Does the patient have prescription insurance?  Yes Type of insurance plan: [employer] the patient qualify for medication assistance through manufacturers or grants?   Pending Eligible grants and/or patient assistance programs: pending  Medication assistance applications in progress: none  Medication assistance applications approved: none Approved medication assistance renewals will be completed by: pending  Outpatient Pharmacy:  Prior to admission outpatient pharmacy: [facility] the [facility]? No Is the patient willing to transition their outpatient pharmacy [facility]?   Pending    Assessment: 1. Acute on chronic systolic CHF (LVEF 30-394-70%ue to NICM. NYHA class III symptoms. - Not indicated for MRA therapy based on renal function.  -Not currently indicated for SLGT2i therapy based on renal function. If it improves during her hospitalization, would consider initiation.  -[facility]. Would recommend holding off till AKI resolves. She does have allergies listed in her chart to RAAS agents, so will need to discuss this with the patient.  -Continue furosemide 80mg30mBID. Will need close renal function monitoring -Continue carvedilol 18.79mg 17m-Daily weights. Strict I/Os. Keep K>4 and Mg>2   Plan: 1) Medication changes recommended at this time: -none at this time  2) Patient assistance: -Copays: $35 for [employer]  3)  Education  - Initial education provided - Full education to be completed prior to discharge  658AllLanice ShirtsmD PGY-1 [facility] Resident

## 2022-05-22 NOTE — Plan of Care (Signed)

## 2022-05-23 DIAGNOSIS — I5023 Acute on chronic systolic (congestive) heart failure: Secondary | ICD-10-CM

## 2022-05-23 DIAGNOSIS — I480 Paroxysmal atrial fibrillation: Secondary | ICD-10-CM | POA: Diagnosis not present

## 2022-05-23 LAB — BASIC METABOLIC PANEL
Anion gap: 7 (ref 5–15)
BUN: 79 mg/dL — ABNORMAL HIGH (ref 8–23)
CO2: 34 mmol/L — ABNORMAL HIGH (ref 22–32)
Calcium: 8.9 mg/dL (ref 8.9–10.3)
Chloride: 99 mmol/L (ref 98–111)
Creatinine, Ser: 2.62 mg/dL — ABNORMAL HIGH (ref 0.44–1.00)
GFR, Estimated: 18 mL/min — ABNORMAL LOW (ref >60)
Glucose, Bld: 191 mg/dL — ABNORMAL HIGH (ref 70–99)
Potassium: 4.1 mmol/L (ref 3.5–5.1)
Sodium: 140 mmol/L (ref 135–145)

## 2022-05-23 LAB — GLUCOSE, CAPILLARY
Glucose-Capillary: 171 mg/dL — ABNORMAL HIGH (ref 70–99)
Glucose-Capillary: 176 mg/dL — ABNORMAL HIGH (ref 70–99)
Glucose-Capillary: 231 mg/dL — ABNORMAL HIGH (ref 70–99)
Glucose-Capillary: 210 mg/dL — ABNORMAL HIGH (ref 70–99)

## 2022-05-23 NOTE — Progress Notes (Signed)
Rounding Note    Patient Name: Tricia Gonzales Date of Encounter: 05/23/2022  [place] Cardiologist: Evalina Field, MD   Subjective   Continues to have volume. Net negative 2.6L Wt 191->188  Inpatient Medications    Scheduled Meds:  acetaminophen  1,000 mg Oral QPM   apixaban  2.5 mg Oral BID   brinzolamide  1 drop Right Eye TID   And   brimonidine  1 drop Right Eye TID   carvedilol  18.75 mg Oral BID WC   cholecalciferol  1,000 Units Oral Daily   cycloSPORINE  1 drop Both Eyes BID   febuxostat  40 mg Oral Q M,W,F   furosemide  80 mg Intravenous BID   gabapentin  100 mg Oral QHS   hydrALAZINE  25 mg Oral Q8H   insulin aspart  0-9 Units Subcutaneous TID WC   insulin glargine-yfgn  10 Units Subcutaneous QPM   isosorbide mononitrate  30 mg Oral Daily   levothyroxine  75 mcg Oral q morning   montelukast  10 mg Oral QHS   pantoprazole  40 mg Oral Daily   rOPINIRole  0.5 mg Oral QHS   rosuvastatin  5 mg Oral QHS   senna-docusate  2 tablet Oral Daily   traMADol  50 mg Oral QHS   Continuous Infusions:  PRN Meds: dicyclomine, ondansetron, traMADol   Vital Signs    Vitals:   05/23/22 0050 05/23/22 0358 05/23/22 0649 05/23/22 0722  BP: (!) 116/51 (!) 137/54 137/61 127/75  Pulse:  63  64  Resp:  19    Temp: 97.7 F (36.5 C) (!) 97.5 F (36.4 C)  98.3 F (36.8 C)  TempSrc: Oral Oral  Oral  SpO2:  98%  100%  Weight:  85.5 kg    Height:        Intake/Output Summary (Last 24 hours) at 05/23/2022 0841 Last data filed at 05/23/2022 0758 Gross per 24 hour  Intake 820 ml  Output 3500 ml  Net -2680 ml      05/23/2022    3:58 AM 05/22/2022    3:33 AM 05/22/2022   12:59 AM  Last 3 Weights  Weight (lbs) 188 lb 9.6 oz 192 lb 6.4 oz 191 lb 9.6 oz  Weight (kg) 85.548 kg 87.272 kg 86.909 kg      Telemetry    AV paced rhythm at base rate- Personally Reviewed  ECG    Prior ECG AV dual paced rhythm, HR 69 BPM - personally reviewed  Physical Exam    GEN: No acute distress.  Sitting up. No O2 Neck: ++ JVD; RA pressure ~ 15 cm Cardiac: RRR, + holosystolic murmur rubs, or gallops. Radial pulses 2+ bilaterally  Respiratory: Crackles and diminished breat sounds in bilateral lung bases  GI: Distended, soft MS: 2+ pitting edema in BLE; No deformity. Neuro:  Nonfocal  Psych: Normal affect   Labs    High Sensitivity Troponin:  No results for input(s): "TROPONINIHS" in the last 720 hours.   Chemistry Recent Labs  Lab 05/20/22 1557 05/21/22 2057 05/22/22 0106  NA 143 140 140  K 5.1 4.4 4.1  CL 99 103 105  CO2 30* 26 29  GLUCOSE 132* 180* 236*  BUN 76* 79* 82*  CREATININE 2.41* 2.51* 2.61*  CALCIUM 9.5 9.0 8.8*  MG  --  2.3 2.2  PROT  --  5.9*  --   ALBUMIN  --  3.4*  --   AST  --  21  --   ALT  --  17  --   ALKPHOS  --  80  --   BILITOT  --  0.9  --   GFRNONAA  --  19* 18*  ANIONGAP  --  11 6    Lipids No results for input(s): "CHOL", "TRIG", "HDL", "LABVLDL", "LDLCALC", "CHOLHDL" in the last 168 hours.  Hematology Recent Labs  Lab 05/19/22 1146 05/21/22 2057 05/22/22 0106  WBC 4.9 5.8 5.8  RBC 3.15* 3.12* 2.85*  HGB 9.9* 9.8* 9.1*  HCT 31.9* 31.8* 28.7*  MCV 101.3* 101.9* 100.7*  MCH 31.4 31.4 31.9  MCHC 31.0 30.8 31.7  RDW 16.4* 16.3* 16.3*  PLT 165 166 139*   Thyroid  Recent Labs  Lab 05/22/22 0106  TSH 2.716    BNP Recent Labs  Lab 05/21/22 2057  BNP 2,879.8*    DDimer No results for input(s): "DDIMER" in the last 168 hours.   Radiology    DG CHEST PORT 1 VIEW  Result Date: 05/22/2022 CLINICAL DATA:  Shortness of breath EXAM: PORTABLE CHEST 1 VIEW COMPARISON:  Radiograph 08/15/2020 FINDINGS: Unchanged enlarged cardiac silhouette with dual chamber pacemaker leads. There is no focal airspace disease. Central pulmonary vascular congestion. No new airspace disease. Trace pleural effusions. No evidence of pneumothorax. IMPRESSION: Cardiomegaly with pulmonary vascular congestion and trace pleural  effusions. Electronically Signed   By: Maurine Simmering M.D.   On: 05/22/2022 10:30    Cardiac Studies   Echocardiogram 05/11/22 1. Left ventricular ejection fraction, by estimation, is 30 to 35%. The  left ventricle demonstrates global hypokinesis. There is mild concentric  left ventricular hypertrophy. Left ventricular diastolic function could  not be evaluated.   2. Right ventricular systolic function is moderately reduced. The right  ventricular size is moderately enlarged. There is severely elevated  pulmonary artery systolic pressure.   3. The mitral valve is normal in structure. Moderate to severe mitral  valve regurgitation. No evidence of mitral stenosis. Moderate mitral  annular calcification.   4. Tricuspid valve regurgitation is severe.   5. The aortic valve is normal in structure. There is mild calcification  of the aortic valve. Aortic valve regurgitation is not visualized. Aortic  valve sclerosis is present, with no evidence of aortic valve stenosis.   6. The inferior vena cava is normal in size with greater than 50%  respiratory variability, suggesting right atrial pressure of 3 mmHg.  [IVC is dilated and no collapsing; RA pressure ~ 15 mmHg]  Comparison(s): 03/20/22 EF 25-30%. PA pressure 4mHg. Moderate-severe MR.  RV moderately enlarged.   Patient Profile     81y.o. 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 Pacing about 88% of the time. Also noted to have had frequent episodes of ectopic atrial tachycardia on device interrogation from 03/2022. HR is better controlled now on a higher dose of carvedilol  - Suspect that reduced EF, mitral  valve regurgitation are due to high pacing load and dyssynchrony, episodes of tachycardia  - Patient was seen by EP on 10/26-- recommended upgrade to a BiV PPM, but patient declined  - Patient was seen in the office on 11/29, and was noted to have ankle edema at that time. Complained of SOB, weakness, abdominal distention. Was direct admitted on 11/30  - BNP elevated to 2879.8. Patient has 2+ pitting edema, JVD, and crackles in lung bases on exam  -------------------------------------------------- - Started on IV lasix 80 mg BID-, will continue with good UOP, if tapers can add metolazone - Patient has baseline CKD stage IV-- creatinine 2.4->2.5->2.6 (bsl 1.8-2.0). Will continue this today 12/2 - Strict I/Os, daily weights  - Continue carvedilol 18.75 mg BID (dose was increased in 03/2022 to help suppress atrial tachycardia. HR is well controlled and in the 60s this admission)  - Continue hydralazine 25 mg TID - Note on ACE/ARB/ARNI or spiro due to CKD, allergies. GFR too low for SGLT2i  Paroxysmal Atrial Fibrillation  - A-V paced - Continue eliquis 2.5 mg BID (dose reduced for age, renal function)   HTN - BB controlled with GDMT as above    Type 2 DM  - On home insulin    Time Spent Directly with Patient:  I have spent a total of 25 minutes with the patient reviewing hospital notes, telemetry, EKGs, labs and examining the patient as well as establishing an assessment and plan that was discussed personally with the patient.  > 50% of time was spent in direct patient care.   For questions or updates, please contact [place] Please consult [email or web address] for contact info under        Signed, Janina Mayo, MD  05/23/2022, 8:41  AM

## 2022-05-24 DIAGNOSIS — I480 Paroxysmal atrial fibrillation: Secondary | ICD-10-CM | POA: Diagnosis not present

## 2022-05-24 DIAGNOSIS — I5023 Acute on chronic systolic (congestive) heart failure: Secondary | ICD-10-CM | POA: Diagnosis not present

## 2022-05-24 LAB — GLUCOSE, CAPILLARY
Glucose-Capillary: 157 mg/dL — ABNORMAL HIGH (ref 70–99)
Glucose-Capillary: 148 mg/dL — ABNORMAL HIGH (ref 70–99)
Glucose-Capillary: 175 mg/dL — ABNORMAL HIGH (ref 70–99)

## 2022-05-24 LAB — BASIC METABOLIC PANEL
Anion gap: 12 (ref 5–15)
BUN: 80 mg/dL — ABNORMAL HIGH (ref 8–23)
CO2: 32 mmol/L (ref 22–32)
Calcium: 8.9 mg/dL (ref 8.9–10.3)
Chloride: 96 mmol/L — ABNORMAL LOW (ref 98–111)
Creatinine, Ser: 2.7 mg/dL — ABNORMAL HIGH (ref 0.44–1.00)
GFR, Estimated: 17 mL/min — ABNORMAL LOW (ref >60)
Glucose, Bld: 203 mg/dL — ABNORMAL HIGH (ref 70–99)
Potassium: 4 mmol/L (ref 3.5–5.1)
Sodium: 140 mmol/L (ref 135–145)

## 2022-05-24 MED ORDER — METOLAZONE 5 MG PO TABS
5.0000 mg | ORAL_TABLET | Freq: Once | ORAL | Status: AC
  Administered 2022-05-24: 5 mg via ORAL
  Filled 2022-05-24: qty 1

## 2022-05-24 MED ORDER — FUROSEMIDE 10 MG/ML IJ SOLN
120.0000 mg | Freq: Two times a day (BID) | INTRAVENOUS | Status: DC
  Filled 2022-05-24 (×3): qty 12

## 2022-05-24 NOTE — Progress Notes (Signed)
Rounding Note    Patient Name: Tricia Gonzales Date of Encounter: 05/24/2022  [place] Cardiologist: Evalina Field, MD   Subjective   Still SOB lying flat. On [place]  Inpatient Medications    Scheduled Meds:  acetaminophen  1,000 mg Oral QPM   apixaban  2.5 mg Oral BID   brinzolamide  1 drop Right Eye TID   And   brimonidine  1 drop Right Eye TID   carvedilol  18.75 mg Oral BID WC   cholecalciferol  1,000 Units Oral Daily   cycloSPORINE  1 drop Both Eyes BID   febuxostat  40 mg Oral Q M,W,F   furosemide  80 mg Intravenous BID   gabapentin  100 mg Oral QHS   hydrALAZINE  25 mg Oral Q8H   insulin aspart  0-9 Units Subcutaneous TID WC   insulin glargine-yfgn  10 Units Subcutaneous QPM   isosorbide mononitrate  30 mg Oral Daily   levothyroxine  75 mcg Oral q morning   montelukast  10 mg Oral QHS   pantoprazole  40 mg Oral Daily   rOPINIRole  0.5 mg Oral QHS   rosuvastatin  5 mg Oral QHS   senna-docusate  2 tablet Oral Daily   traMADol  50 mg Oral QHS   Continuous Infusions:  PRN Meds: dicyclomine, ondansetron, traMADol   Vital Signs    Vitals:   05/23/22 1923 05/24/22 0014 05/24/22 0413 05/24/22 0831  BP: (!) 125/52 (!) 125/44 (!) 130/49 (!) 105/45  Pulse: 63 60 60 63  Resp: _0 Temp: 98.2 F (36.8 C) 98.1 F (36.7 C) 98 F (36.7 C) 97.9 F (36.6 C)  TempSrc: Oral Oral Oral Oral  SpO2: 98% 99% 99% 99%  Weight:   85.5 kg   Height:        Intake/Output Summary (Last 24 hours) at 05/24/2022 0920 Last data filed at 05/24/2022 0076 Gross per 24 hour  Intake 680 ml  Output 1600 ml  Net -920 ml      05/24/2022    4:13 AM 05/23/2022    3:58 AM 05/22/2022    3:33 AM  Last 3 Weights  Weight (lbs) 188 lb 8 oz 188 lb 9.6 oz 192 lb 6.4 oz  Weight (kg) 85.503 kg 85.548 kg 87.272 kg      Telemetry    AV paced rhythm at base rate- Personally Reviewed  ECG    Prior ECG AV dual paced rhythm, HR 69 BPM - personally reviewed  Physical  Exam   GEN: No acute distress.  Sitting up. No O2 Neck: ++ JVD; RA pressure ~ 15 cm Cardiac: RRR, + holosystolic murmur rubs, or gallops. Radial pulses 2+ bilaterally  Respiratory: Crackles and diminished breat sounds in bilateral lung bases  GI: Distended, soft MS: 2+ pitting edema in BLE; No deformity. Neuro:  Nonfocal  Psych: Normal affect   Labs    High Sensitivity Troponin:  No results for input(s): "TROPONINIHS" in the last 720 hours.   Chemistry Recent Labs  Lab 05/21/22 2057 05/22/22 0106 05/23/22 1821 05/24/22 0036  NA 140 140 140 140  K 4.4 4.1 4.1 4.0  CL 103 105 99 96*  CO2 26 29 34* 32  GLUCOSE 180* 236* 191* 203*  BUN 79* 82* 79* 80*  CREATININE 2.51* 2.61* 2.62* 2.70*  CALCIUM 9.0 8.8* 8.9 8.9  MG 2.3 2.2  --   --   PROT 5.9*  --   --   --  ALBUMIN 3.4*  --   --   --   AST 21  --   --   --   ALT 17  --   --   --   ALKPHOS 80  --   --   --   BILITOT 0.9  --   --   --   GFRNONAA 19* 18* 18* 17*  ANIONGAP _0 Lipids No results for input(s): "CHOL", "TRIG", "HDL", "LABVLDL", "LDLCALC", "CHOLHDL" in the last 168 hours.  Hematology Recent Labs  Lab 05/19/22 1146 05/21/22 2057 05/22/22 0106  WBC 4.9 5.8 5.8  RBC 3.15* 3.12* 2.85*  HGB 9.9* 9.8* 9.1*  HCT 31.9* 31.8* 28.7*  MCV 101.3* 101.9* 100.7*  MCH 31.4 31.4 31.9  MCHC 31.0 30.8 31.7  RDW 16.4* 16.3* 16.3*  PLT 165 166 139*   Thyroid  Recent Labs  Lab 05/22/22 0106  TSH 2.716    BNP Recent Labs  Lab 05/21/22 2057  BNP 2,879.8*    DDimer No results for input(s): "DDIMER" in the last 168 hours.   Radiology    DG CHEST PORT 1 VIEW  Result Date: 05/22/2022 CLINICAL DATA:  Shortness of breath EXAM: PORTABLE CHEST 1 VIEW COMPARISON:  Radiograph 08/15/2020 FINDINGS: Unchanged enlarged cardiac silhouette with dual chamber pacemaker leads. There is no focal airspace disease. Central pulmonary vascular congestion. No new airspace disease. Trace pleural effusions. No evidence of  pneumothorax. IMPRESSION: Cardiomegaly with pulmonary vascular congestion and trace pleural effusions. Electronically Signed   By: Maurine Simmering M.D.   On: 05/22/2022 10:30    Cardiac Studies   Echocardiogram 05/11/22 1. Left ventricular ejection fraction, by estimation, is 30 to 35%. The  left ventricle demonstrates global hypokinesis. There is mild concentric  left ventricular hypertrophy. Left ventricular diastolic function could  not be evaluated.   2. Right ventricular systolic function is moderately reduced. The right  ventricular size is moderately enlarged. There is severely elevated  pulmonary artery systolic pressure.   3. The mitral valve is normal in structure. Moderate to severe mitral  valve regurgitation. No evidence of mitral stenosis. Moderate mitral  annular calcification.   4. Tricuspid valve regurgitation is severe.   5. The aortic valve is normal in structure. There is mild calcification  of the aortic valve. Aortic valve regurgitation is not visualized. Aortic  valve sclerosis is present, with no evidence of aortic valve stenosis.   6. The inferior vena cava is normal in size with greater than 50%  respiratory variability, suggesting right atrial pressure of 3 mmHg.  [IVC is dilated and no collapsing; RA pressure ~ 15 mmHg]  Comparison(s): 03/20/22 EF 25-30%. PA pressure 58mHg. Moderate-severe MR.  RV moderately enlarged.   Patient Profile     81y.o. 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 Pacing about 88% of the time. Also noted to have had frequent episodes of  ectopic atrial tachycardia on device interrogation from 03/2022. HR is better controlled now on a higher dose of carvedilol  - Suspect that reduced EF, mitral valve regurgitation are due to high pacing load and dyssynchrony, episodes of tachycardia  - Patient was seen by EP on 10/26-- recommended upgrade to a BiV PPM, but patient declined  - Patient was seen in the office on 11/29, and was noted to have ankle edema at that time. Complained of SOB, weakness, abdominal distention. Was direct admitted on 11/30  - BNP elevated to 2879.8. Patient has 2+ pitting edema, JVD, and crackles in lung bases on exam  -------------------------------------------------- - Started on IV lasix 80 mg BID continued on 12/2. Will increase today with tapering weight. Goal net negative closer to 2L - Patient has baseline CKD stage IV-- creatinine 2.4->2.5->2.6->2.7 (bsl 1.8-2.0). - net negative 1.18L 12/3; Wt 191->188 - Continue carvedilol 18.75 mg BID (dose was increased in 03/2022 to help suppress atrial tachycardia. HR is well controlled and in the 60s this admission)  - Continue hydralazine 25 mg TID - Not on ACE/ARB/ARNI or spiro due to CKD, allergies. GFR too low for SGLT2i - Strict I/Os, daily weights   Paroxysmal Atrial Fibrillation  - A-V paced - Continue eliquis 2.5 mg BID (dose reduced for age, renal function)   HTN - BB controlled with GDMT as above    Type 2 DM  - On home insulin    Time Spent Directly with Patient:  I have spent a total of 25 minutes with the patient reviewing hospital notes, telemetry, EKGs, labs and examining the patient as well as establishing an assessment and plan that was discussed personally with the patient.  > 50% of time was spent in direct patient care.   For questions or updates, please  contact [place] Please consult [email or web address] for contact info under        Signed, Janina Mayo, MD  05/24/2022, 9:20 AM

## 2022-05-25 DIAGNOSIS — I480 Paroxysmal atrial fibrillation: Secondary | ICD-10-CM | POA: Diagnosis not present

## 2022-05-25 DIAGNOSIS — I5023 Acute on chronic systolic (congestive) heart failure: Secondary | ICD-10-CM | POA: Diagnosis not present

## 2022-05-25 LAB — BASIC METABOLIC PANEL
Anion gap: 7 (ref 5–15)
BUN: 81 mg/dL — ABNORMAL HIGH (ref 8–23)
CO2: 35 mmol/L — ABNORMAL HIGH (ref 22–32)
Calcium: 8.6 mg/dL — ABNORMAL LOW (ref 8.9–10.3)
Chloride: 97 mmol/L — ABNORMAL LOW (ref 98–111)
Creatinine, Ser: 2.98 mg/dL — ABNORMAL HIGH (ref 0.44–1.00)
GFR, Estimated: 15 mL/min — ABNORMAL LOW (ref >60)
Glucose, Bld: 182 mg/dL — ABNORMAL HIGH (ref 70–99)
Potassium: 3.9 mmol/L (ref 3.5–5.1)
Sodium: 139 mmol/L (ref 135–145)

## 2022-05-25 LAB — HEMOGLOBIN A1C
Hgb A1c MFr Bld: 7.7 % — ABNORMAL HIGH (ref 4.8–5.6)
Mean Plasma Glucose: 174 mg/dL

## 2022-05-25 LAB — GLUCOSE, CAPILLARY
Glucose-Capillary: 170 mg/dL — ABNORMAL HIGH (ref 70–99)
Glucose-Capillary: 195 mg/dL — ABNORMAL HIGH (ref 70–99)
Glucose-Capillary: 185 mg/dL — ABNORMAL HIGH (ref 70–99)
Glucose-Capillary: 158 mg/dL — ABNORMAL HIGH (ref 70–99)

## 2022-05-25 MED ORDER — PSYLLIUM 95 % PO PACK
1.0000 | PACK | Freq: Every day | ORAL | Status: DC
  Administered 2022-05-25 – 2022-05-27 (×2): 1 via ORAL
  Filled 2022-05-25 (×4): qty 1

## 2022-05-25 MED ORDER — FUROSEMIDE 10 MG/ML IJ SOLN
80.0000 mg | Freq: Two times a day (BID) | INTRAMUSCULAR | Status: DC
  Administered 2022-05-25: 80 mg via INTRAVENOUS
  Filled 2022-05-25: qty 8

## 2022-05-25 MED ORDER — METOLAZONE 2.5 MG PO TABS
2.5000 mg | ORAL_TABLET | Freq: Once | ORAL | Status: AC
  Administered 2022-05-25: 2.5 mg via ORAL
  Filled 2022-05-25: qty 1

## 2022-05-25 NOTE — Progress Notes (Addendum)
   Heart Failure Stewardship Pharmacist Progress Note   PCP: Carol Ada, MD PCP-Cardiologist: Evalina Field, MD    HPI:  81 yo female with PMH significant for PAF, CHF, HTN, HLD, T2DM, CKD stage IV, anemia and hypothyroidism.  On 05/19/2022 she had a visit with her hematologist. She has anemia secondary to chronic renal insufficiency. She takes PO iron and is scheduled to receive Feraheme in 6 weeks in clinic.   On 05/20/2022 she had an appointment with the cardiologist. Her carvedilol dose was increased the month prior and she reported increased fatigue with the dose increase. She was referred to HF at that time, but she wished to not move forward with that decision. Her renal function had worsened and she continued to remain weak and volume-overloaded. She was instructed the following day to report to the hospital for management of her HF inpatient.   Upon arrival to the ED, she endorsed medication adherence, along with increased salt intake over the past few months. She is positive for LEE, dyspnea on exertion, and fatigue.  ECHO on 05/11/2022 showed EF of 30-35% with global hypokinesis, moderately reduced right ventricular systolic function, severely elevated pulmonary artery systolic pressure, severe TV regurgitation and moderate to severe MV regurgitation. Previous EF in 02/2022 was 25-30%.   ReDS elevated at 38% today (normal 20-35%).  Current HF Medications: Beta Blocker: carvedilol 18.75 mg BID Other: hydralazine 25 mg TID + Imdur 30 mg daily  Prior to admission HF Medications: Diuretic: torsemide 35m daily Beta blocker: carvedilol 18.77mBID  Pertinent Lab Values: Serum creatinine 2.98, BUN 81, Potassium 3.9, Sodium 139, BNP 2,897.8, Magnesium 2.2, A1c 6.0 (07/2020)  Vital Signs: Weight: 191 lbs (admission weight: 191 lbs)  Blood pressure: 120-140/60s Heart rate: 60s  I/O: -1.6L yesterday, net -6.3L  Medication Assistance / Insurance Benefits Check: Does the  patient have prescription insurance?  Yes Type of insurance plan: [facility]  [facility] Is the patient willing to use [facility] at discharge? Yes Is the patient willing to transition their outpatient pharmacy [facility]?   Pending    Assessment: 1. Acute on chronic systolic CHF (LVEF 3033-44% due to NICM. NYHA class III symptoms. - Denies orthopnea. ReDS elevated on exam today. Consider low dose IV lasix given renal dysfunction. Baseline creatinine appears ~2. - Continue carvedilol 18.75 mg BID - No ACE/ARB/ARNI, MRA, or SGLT2i with renal dysfunction - Continue hydralazine 25 mg TID + Imdur 30 mg daily - Daily weights. Strict I/Os. Keep K>4 and Mg>2   Plan: 1) Medication changes recommended at this time: - Restart IV lasix  2) Patient assistance: -Copays: $35 for EnFerne Coeand Farxiga  3)  Education  - Initial education provided - Full education to be completed prior to discharge  MeKerby NoraPharmD, BCPS Heart Failure StCytogeneticisthone ([phone]

## 2022-05-25 NOTE — Progress Notes (Signed)
REDS Clip  READING = 38% (20-35% = normal     >35% = volume overload)   CHEST RULER = 26 in [place] = B  Kerby Nora, PharmD, BCPS Heart Failure Cytogeneticist Phone [phone]

## 2022-05-25 NOTE — Care Plan (Signed)
Will re-diurese. Spoke with her primary cardiologist.

## 2022-05-25 NOTE — Progress Notes (Addendum)
Rounding Note    Patient Name: Tricia Gonzales Date of Encounter: 05/25/2022  [place] Cardiologist: Evalina Field, MD   Subjective   Can lie flat at the bedside.   Inpatient Medications    Scheduled Meds:  acetaminophen  1,000 mg Oral QPM   apixaban  2.5 mg Oral BID   brinzolamide  1 drop Right Eye TID   And   brimonidine  1 drop Right Eye TID   carvedilol  18.75 mg Oral BID WC   cholecalciferol  1,000 Units Oral Daily   cycloSPORINE  1 drop Both Eyes BID   febuxostat  40 mg Oral Q M,W,F   gabapentin  100 mg Oral QHS   hydrALAZINE  25 mg Oral Q8H   insulin aspart  0-9 Units Subcutaneous TID WC   insulin glargine-yfgn  10 Units Subcutaneous QPM   isosorbide mononitrate  30 mg Oral Daily   levothyroxine  75 mcg Oral q morning   montelukast  10 mg Oral QHS   pantoprazole  40 mg Oral Daily   rOPINIRole  0.5 mg Oral QHS   rosuvastatin  5 mg Oral QHS   senna-docusate  2 tablet Oral Daily   traMADol  50 mg Oral QHS   Continuous Infusions:  PRN Meds: dicyclomine, ondansetron, traMADol   Vital Signs    Vitals:   05/24/22 2011 05/25/22 0155 05/25/22 0542 05/25/22 0838  BP: (!) 111/43 (!) 119/57 118/62 (!) 113/54  Pulse: 63 70 91 66  Resp: 18   17  Temp: 97.6 F (36.4 C) 98.4 F (36.9 C) 98.5 F (36.9 C) (!) 97.4 F (36.3 C)  TempSrc: Oral Oral Oral Oral  SpO2: 98% 97% 93% 96%  Weight:   86.9 kg   Height:        Intake/Output Summary (Last 24 hours) at 05/25/2022 0848 Last data filed at 05/25/2022 0840 Gross per 24 hour  Intake 660 ml  Output 2100 ml  Net -1440 ml      05/25/2022    5:42 AM 05/24/2022    4:13 AM 05/23/2022    3:58 AM  Last 3 Weights  Weight (lbs) 191 lb 9.3 oz 188 lb 8 oz 188 lb 9.6 oz  Weight (kg) 86.9 kg 85.503 kg 85.548 kg      Telemetry    AV paced rhythm at base rate- Personally Reviewed  ECG    Prior ECG AV dual paced rhythm, HR 69 BPM - personally reviewed  Physical Exam   GEN: No acute distress.  Sitting  up. No O2 Neck: + JVD Cardiac: RRR, + holosystolic murmur rubs, or gallops. Radial pulses 2+ bilaterally  Respiratory: Crackles and diminished breat sounds in bilateral lung bases  GI: Distended, soft MS: 2+ pitting edema in BLE; No deformity. Neuro:  Nonfocal  Psych: Normal affect   Labs    High Sensitivity Troponin:  No results for input(s): "TROPONINIHS" in the last 720 hours.   Chemistry Recent Labs  Lab 05/21/22 2057 05/22/22 0106 05/23/22 1821 05/24/22 0036 05/25/22 0044  NA 140 140 140 140 139  K 4.4 4.1 4.1 4.0 3.9  CL 103 105 99 96* 97*  CO2 26 29 34* 32 35*  GLUCOSE 180* 236* 191* 203* 182*  BUN 79* 82* 79* 80* 81*  CREATININE 2.51* 2.61* 2.62* 2.70* 2.98*  CALCIUM 9.0 8.8* 8.9 8.9 8.6*  MG 2.3 2.2  --   --   --   PROT 5.9*  --   --   --   --  ALBUMIN 3.4*  --   --   --   --   AST 21  --   --   --   --   ALT 17  --   --   --   --   ALKPHOS 80  --   --   --   --   BILITOT 0.9  --   --   --   --   GFRNONAA 19* 18* 18* 17* 15*  ANIONGAP _0 Lipids No results for input(s): "CHOL", "TRIG", "HDL", "LABVLDL", "LDLCALC", "CHOLHDL" in the last 168 hours.  Hematology Recent Labs  Lab 05/19/22 1146 05/21/22 2057 05/22/22 0106  WBC 4.9 5.8 5.8  RBC 3.15* 3.12* 2.85*  HGB 9.9* 9.8* 9.1*  HCT 31.9* 31.8* 28.7*  MCV 101.3* 101.9* 100.7*  MCH 31.4 31.4 31.9  MCHC 31.0 30.8 31.7  RDW 16.4* 16.3* 16.3*  PLT 165 166 139*   Thyroid  Recent Labs  Lab 05/22/22 0106  TSH 2.716    BNP Recent Labs  Lab 05/21/22 2057  BNP 2,879.8*    DDimer No results for input(s): "DDIMER" in the last 168 hours.   Radiology    No results found.  Cardiac Studies   Echocardiogram 05/11/22 1. Left ventricular ejection fraction, by estimation, is 30 to 35%. The  left ventricle demonstrates global hypokinesis. There is mild concentric  left ventricular hypertrophy. Left ventricular diastolic function could  not be evaluated.   2. Right ventricular systolic  function is moderately reduced. The right  ventricular size is moderately enlarged. There is severely elevated  pulmonary artery systolic pressure.   3. The mitral valve is normal in structure. Moderate to severe mitral  valve regurgitation. No evidence of mitral stenosis. Moderate mitral  annular calcification.   4. Tricuspid valve regurgitation is severe.   5. The aortic valve is normal in structure. There is mild calcification  of the aortic valve. Aortic valve regurgitation is not visualized. Aortic  valve sclerosis is present, with no evidence of aortic valve stenosis.   6. The inferior vena cava is normal in size with greater than 50%  respiratory variability, suggesting right atrial pressure of 3 mmHg.  [IVC is dilated and no collapsing; RA pressure ~ 15 mmHg]  Comparison(s): 03/20/22 EF 25-30%. PA pressure 34mHg. Moderate-severe MR.  RV moderately enlarged.   Patient Profile     81y.o. 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 Pacing about 88% of the time. Also noted to have had frequent episodes of ectopic atrial tachycardia on device interrogation from 03/2022. HR is better controlled now on a higher dose of carvedilol  - Suspect that reduced EF, mitral valve regurgitation are due to high pacing load and dyssynchrony, episodes of tachycardia  - Patient was seen by EP on 10/26-- recommended upgrade to a  BiV PPM, but patient declined  - Patient was  seen in the office on 11/29, and was noted to have ankle edema at that time. Complained of SOB, weakness, abdominal distention. Was direct admitted on 11/30  - BNP elevated to 2879.8. Patient has 2+ pitting edema, JVD, and crackles in lung bases on exam  -------------------------------------------------- - Started on IV lasix 80 mg BID continued on 12/2; admitted 11/30. Increased yesterday  today with tapering weight.  - Patient has baseline CKD stage IV-- creatinine 2.4->2.5->2.6->2.7->2.98 (bsl 1.8-2.0). -total net negative 6L;  191->188 - Considering renal fxn worsening will stop her diuretics and can transition to orals once her renal fxn has improved. Clinically, she can lie flat. Her volume status is challenging with weight and immobility. Goal for her to be able to ambulate and assess how close she is to her baseline. This is a longstanding chronic issue that will not drastically improve. Getting a REDS reading on her. If she is not able to ambulate/clinically improving will consider RHC - Continue carvedilol 18.75 mg BID (dose was increased in 03/2022 to help suppress atrial tachycardia. HR is well controlled and in the 60s this admission)  - Continue hydralazine 25 mg TID - Not on ACE/ARB/ARNI or spiro due to CKD, allergies. GFR too low for SGLT2i - Strict I/Os, daily weights   Paroxysmal Atrial Fibrillation  - A-V paced - Continue eliquis 2.5 mg BID (dose reduced for age, renal function)   HTN - BB controlled with GDMT as above    Type 2 DM  - On home insulin   Plan for dispo soon as long as she can ambulate. She can lie flat. PT/OT recs. Likely Tue/Wed.  Time Spent Directly with Patient:  I have spent a total of 35 minutes with the patient reviewing hospital notes, telemetry, EKGs, labs and examining the patient as well as establishing an assessment and plan that was discussed personally with the patient.  > 50% of time was spent in  direct patient care.   For questions or updates, please contact [place] Please consult [email or web address] for contact info under        Signed, Janina Mayo, MD  05/25/2022, 8:48 AM

## 2022-05-25 NOTE — Progress Notes (Signed)
Remote pacemaker transmission.

## 2022-05-26 DIAGNOSIS — I480 Paroxysmal atrial fibrillation: Secondary | ICD-10-CM | POA: Diagnosis not present

## 2022-05-26 DIAGNOSIS — I5023 Acute on chronic systolic (congestive) heart failure: Secondary | ICD-10-CM | POA: Diagnosis not present

## 2022-05-26 LAB — GLUCOSE, CAPILLARY
Glucose-Capillary: 184 mg/dL — ABNORMAL HIGH (ref 70–99)
Glucose-Capillary: 149 mg/dL — ABNORMAL HIGH (ref 70–99)
Glucose-Capillary: 132 mg/dL — ABNORMAL HIGH (ref 70–99)
Glucose-Capillary: 152 mg/dL — ABNORMAL HIGH (ref 70–99)

## 2022-05-26 LAB — BASIC METABOLIC PANEL
Anion gap: 9 (ref 5–15)
BUN: 85 mg/dL — ABNORMAL HIGH (ref 8–23)
CO2: 33 mmol/L — ABNORMAL HIGH (ref 22–32)
Calcium: 8.6 mg/dL — ABNORMAL LOW (ref 8.9–10.3)
Chloride: 96 mmol/L — ABNORMAL LOW (ref 98–111)
Creatinine, Ser: 3.04 mg/dL — ABNORMAL HIGH (ref 0.44–1.00)
GFR, Estimated: 15 mL/min — ABNORMAL LOW (ref >60)
Glucose, Bld: 224 mg/dL — ABNORMAL HIGH (ref 70–99)
Potassium: 4.2 mmol/L (ref 3.5–5.1)
Sodium: 138 mmol/L (ref 135–145)

## 2022-05-26 MED ORDER — ALUM & MAG HYDROXIDE-SIMETH 200-200-20 MG/5ML PO SUSP
30.0000 mL | Freq: Once | ORAL | Status: AC
  Administered 2022-05-26: 30 mL via ORAL
  Filled 2022-05-26: qty 30

## 2022-05-26 NOTE — Progress Notes (Signed)
Patient would like update from rounding team regarding plan of care.

## 2022-05-26 NOTE — Progress Notes (Signed)
   Heart Failure Stewardship Pharmacist Progress Note   PCP: Carol Ada, MD PCP-Cardiologist: Evalina Field, MD    HPI:  82 yo female with PMH significant for PAF, CHF, HTN, HLD, T2DM, CKD stage IV, anemia and hypothyroidism.  On 05/19/2022 she had a visit with her hematologist. She has anemia secondary to chronic renal insufficiency. She takes PO iron and is scheduled to receive Feraheme in 6 weeks in clinic.   On 05/20/2022 she had an appointment with the cardiologist. Her carvedilol dose was increased the month prior and she reported increased fatigue with the dose increase. She was referred to HF at that time, but she wished to not move forward with that decision. Her renal function had worsened and she continued to remain weak and volume-overloaded. She was instructed the following day to report to the hospital for management of her HF inpatient.   Upon arrival to the ED, she endorsed medication adherence, along with increased salt intake over the past few months. She is positive for LEE, dyspnea on exertion, and fatigue.  ECHO on 05/11/2022 showed EF of 30-35% with global hypokinesis, moderately reduced right ventricular systolic function, severely elevated pulmonary artery systolic pressure, severe TV regurgitation and moderate to severe MV regurgitation. Previous EF in 02/2022 was 25-30%.   ReDS elevated at 38% on 12/4 (normal 20-35%). IV lasix restarted.  Current HF Medications: Beta Blocker: carvedilol 18.75 mg BID Other: hydralazine 25 mg TID + Imdur 30 mg daily  Prior to admission HF Medications: Diuretic: torsemide 20m daily Beta blocker: carvedilol 18.72mBID  Pertinent Lab Values: Serum creatinine 3.04, BUN 85, Potassium 4.2, Sodium 138, BNP 2,897.8, Magnesium 2.2, A1c 6.0 (07/2020)  Vital Signs: Weight: 188 lbs (admission weight: 191 lbs)  Blood pressure: 130/60s Heart rate: 60s  I/O: -1.2L yesterday, net -7.1L  Medication Assistance / Insurance Benefits  Check: Does the patient have prescription insurance?  Yes Type of insurance plan: [facility]  [facility] Is the patient willing [facility] use [facility] at [facility]? Yes Is the patient willing to transition their outpatient pharmacy [facility]?   Pending    Assessment: 1. Acute on chronic systolic CHF (LVEF 3066-29% due to NICM. NYHA class III symptoms. - Lasix holiday given renal function, plan for IV lasix tomorrow - Continue carvedilol 18.75 mg BID - No ACE/ARB/ARNI, MRA, or SGLT2i with renal dysfunction - Continue hydralazine 25 mg TID + Imdur 30 mg daily - Daily weights. Strict I/Os. Keep K>4 and Mg>2   Plan: 1) Medication changes recommended at this time: - No changes  2) Patient assistance: -Copays: $35 for EnFerne Coeand Farxiga  3)  Education  - Initial education provided - Full education to be completed prior to discharge  MeKerby NoraPharmD, BCPS Heart Failure StCytogeneticisthone [phone]

## 2022-05-26 NOTE — Progress Notes (Signed)
   05/26/22 1000  Mobility  Activity Ambulated with assistance in hallway  Level of Assistance Contact guard assist, steadying assist  Assistive Device Cane  Distance Ambulated (ft) 50 ft  Activity Response Tolerated well;Tolerated fair  Mobility Referral Yes  $Mobility charge 1 Mobility   Mobility Specialist Progress Note  Pt was in bed and agreeable. X3 standing breaks d/t fatigue. Returned to chair w/ all needs met and call bell in reach.   Lucious Groves Mobility Specialist  Please contact via SecureChat or Rehab office at [phone]

## 2022-05-26 NOTE — Care Management Important Message (Signed)
Important Message  Patient Details  Name: Tricia Gonzales MRN: [id number] Date of Birth: January 15, 1941   Medicare Important Message Given:  Yes     Shelda Altes 05/26/2022, 9:25 AM

## 2022-05-26 NOTE — Evaluation (Signed)
Physical Therapy Evaluation Patient Details Name: Tricia Gonzales MRN: [id number] DOB: 1941-02-09 Today's Date: 05/26/2022  History of Present Illness  Pt is an 81 y.o. female admitted 05/21/22 with CHF. PMH includes PAF, CHF s/p PPM, CHF, HTN, HLD, DM2, CKD IV.  Clinical Impression  Pt presents with an overall decrease in functional mobility secondary to above. PTA, pt mod indep ambulating with SPC vs rollator; pt lives alone with multiple friends/family who check on her daily; pt works with OP PT. Today, pt moving well with SPC, requiring 2L O2 [place] to maintain SpO2 >/88% with ambulation (see below). Initiated educ re: activity recommendations, fall risk reduction, energy conservation. Pt would benefit from continued acute PT services to maximize functional mobility and independence prior to d/c home.     SATURATION QUALIFICATIONS:  Patient Saturations on Room Air at Rest = 89% Patient Saturations on [employer] while Ambulating = 86% Patient Saturations on 2 Liters of oxygen while Ambulating = 93%   Recommendations for follow up therapy are one component of a multi-disciplinary discharge planning process, led by the attending physician.  Recommendations may be updated based on patient status, additional functional criteria and insurance authorization.  Follow Up Recommendations Outpatient PT (continued)      Assistance Recommended at Discharge Intermittent Supervision/Assistance  Patient can return home with the following  Assistance with cooking/housework;Assist for transportation;Help with stairs or ramp for entrance    Equipment Recommendations None recommended by PT (home O2?)  Recommendations for Other Services   Mobility Specialist   Functional Status Assessment Patient has had a recent decline in their functional status and demonstrates the ability to make significant improvements in function in a reasonable and predictable amount of time.     Precautions / Restrictions  Precautions Precautions: Fall;Other (comment) Precaution Comments: Watch SpO2 (does not wear O2 baseline) Restrictions Weight Bearing Restrictions: No      Mobility  Bed Mobility               General bed mobility comments: received sitting in recliner    Transfers Overall transfer level: Needs assistance Equipment used: Straight cane Transfers: Sit to/from Stand Sit to Stand: Supervision           General transfer comment: performed 2x sit<>stand from recliner to [facility] during session, supervision for safety    Ambulation/Gait Ambulation/Gait assistance: Min guard, Supervision Gait Distance (Feet): 42 Feet (+ 42') Assistive device: Straight cane Gait Pattern/deviations: Step-through pattern, Decreased stride length, Wide base of support Gait velocity: Decreased     General Gait Details: slow, mostly steady gait with SPC and intermittent min guard for balance, progressing to supervision; pt reaching out to furniture at times for additional UE support; initial walk 42' on RA with SpO2 down to 86%, quick recovery with seated rest and pursed lip breathing; walking another 42' on 2L O2 with SpO2 >/93%  Stairs            Wheelchair Mobility    Modified Rankin (Stroke Patients Only)       Balance Overall balance assessment: Needs assistance   Sitting balance-Leahy Scale: Good     Standing balance support: No upper extremity supported, Single extremity supported, During functional activity Standing balance-Leahy Scale: Fair Standing balance comment: can static stand and take steps without UE support, preference for [facility]                             Pertinent Vitals/Pain Pain  Assessment Pain Assessment: No/denies pain    Home Living Family/patient expects to be discharged to:: Private residence Living Arrangements: Alone Available Help at Discharge: Family;Friend(s);Available PRN/intermittently Type of Home: House Home Access: Stairs to  enter Entrance Stairs-Rails: Psychiatric nurse of Steps: 4   Home Layout: One level Home Equipment: Conservation officer, nature (2 wheels);Rollator (4 wheels);Cane - single point;Shower seat Additional Comments: between daughter, son, sisters and friends, she states 1-2 people check on her daily    Prior Function Prior Level of Function : Independent/Modified Independent;Driving             Mobility Comments: mod indep limited community ambulator with [facility] or rollator. drives, but has had increased difficulty loading/unloading groceries so daughter is going to help her set-up grocery delivery. works with OPPT ADLs Comments: uses shower seat for bathing and UE support to step over tub     Hand Dominance        Extremity/Trunk Assessment   Upper Extremity Assessment Upper Extremity Assessment: Generalized weakness    Lower Extremity Assessment Lower Extremity Assessment: Generalized weakness       Communication   Communication: No difficulties  Cognition Arousal/Alertness: Awake/alert Behavior During Therapy: WFL for tasks assessed/performed Overall Cognitive Status: Within Functional Limits for tasks assessed                                          General Comments General comments (skin integrity, edema, etc.): pt required 2L O2 [place] to maintain SpO2 >/88% with activity; educ on this. initiated educ re: current O2 needs, activity recommendations, energy conservation, fall risk reduction    Exercises     Assessment/Plan    PT Assessment Patient needs continued PT services  PT Problem List         PT Treatment Interventions DME instruction;Gait training;Stair training;Functional mobility training;Therapeutic activities;Therapeutic exercise;Patient/family education;Balance training    PT Goals (Current goals can be found in the Care Plan section)  Acute Rehab PT Goals Patient Stated Goal: return home without need for supplemental oxygen PT  Goal Formulation: With patient Time For Goal Achievement: 06/09/22 Potential to Achieve Goals: Good    Frequency Min 3X/week     Co-evaluation               AM-PAC PT "6 Clicks" Mobility  Outcome Measure Help needed turning from your back to your side while in a flat bed without using bedrails?: None Help needed moving from lying on your back to sitting on the side of a flat bed without using bedrails?: None Help needed moving to and from a bed to a chair (including a wheelchair)?: A Little Help needed standing up from a chair using your arms (e.g., wheelchair or bedside chair)?: A Little Help needed to walk in hospital room?: A Little Help needed climbing 3-5 steps with a railing? : A Little 6 Click Score: 20    End of Session Equipment Utilized During Treatment: Oxygen Activity Tolerance: Patient tolerated treatment well Patient left: in chair;with call bell/phone within reach Nurse Communication: Mobility status PT Visit Diagnosis: Other abnormalities of gait and mobility (R26.89)    Time: 4970-2637 PT Time Calculation (min) (ACUTE ONLY): 20 min   Charges:   PT Evaluation $PT Eval Moderate Complexity: [place], PT, DPT Acute Rehabilitation Services  Personal: [facility] Office: [phone]  Derry Lory 05/26/2022, 12:56 PM

## 2022-05-26 NOTE — Progress Notes (Signed)
Nurse requested Mobility Specialist to perform oxygen saturation test with pt which includes removing pt from oxygen both at rest and while ambulating.  Below are the results from that testing.     Patient Saturations on Room Air at Rest = spO2 93%  Patient Saturations on Room Air while Ambulating = sp02 85% .  Rested and performed pursed lip breathing for 1 minute with sp02 at 86%.  Patient Saturations on 2 Liters of oxygen while Ambulating = sp02 93%  At end of testing pt left in room on 2  Liters of oxygen.  Reported results to nurse.

## 2022-05-26 NOTE — Progress Notes (Signed)
Rounding Note    Patient Name: Tricia Gonzales Date of Encounter: 05/26/2022  [place] Cardiologist: Evalina Field, MD   Subjective   No changes. Renal function continues to rise. Will give a diuresis holiday. Plan for another IV tomorrow  Inpatient Medications    Scheduled Meds:  acetaminophen  1,000 mg Oral QPM   apixaban  2.5 mg Oral BID   brinzolamide  1 drop Right Eye TID   And   brimonidine  1 drop Right Eye TID   carvedilol  18.75 mg Oral BID WC   cholecalciferol  1,000 Units Oral Daily   cycloSPORINE  1 drop Both Eyes BID   febuxostat  40 mg Oral Q M,W,F   gabapentin  100 mg Oral QHS   hydrALAZINE  25 mg Oral Q8H   insulin aspart  0-9 Units Subcutaneous TID WC   insulin glargine-yfgn  10 Units Subcutaneous QPM   isosorbide mononitrate  30 mg Oral Daily   levothyroxine  75 mcg Oral q morning   montelukast  10 mg Oral QHS   pantoprazole  40 mg Oral Daily   psyllium  1 packet Oral Daily   rOPINIRole  0.5 mg Oral QHS   rosuvastatin  5 mg Oral QHS   senna-docusate  2 tablet Oral Daily   traMADol  50 mg Oral QHS   Continuous Infusions:  PRN Meds: dicyclomine, ondansetron, traMADol   Vital Signs    Vitals:   05/25/22 2010 05/26/22 0015 05/26/22 0538 05/26/22 0732  BP: (!) 109/48 (!) 110/45 (!) 114/53 134/62  Pulse: 62 (!) 59 60 69  Resp: _0 Temp: 97.8 F (36.6 C) 97.8 F (36.6 C) 97.8 F (36.6 C) 97.8 F (36.6 C)  TempSrc: Oral Oral Oral Oral  SpO2: 92% 95% 98% 96%  Weight:   85.4 kg   Height:        Intake/Output Summary (Last 24 hours) at 05/26/2022 0854 Last data filed at 05/26/2022 0800 Gross per 24 hour  Intake 656 ml  Output 1490 ml  Net -834 ml      05/26/2022    5:38 AM 05/25/2022    5:42 AM 05/24/2022    4:13 AM  Last 3 Weights  Weight (lbs) 188 lb 3.2 oz 191 lb 9.3 oz 188 lb 8 oz  Weight (kg) 85.367 kg 86.9 kg 85.503 kg      Telemetry    AV paced rhythm at base rate- Personally Reviewed  ECG    Prior  ECG AV dual paced rhythm, HR 69 BPM - personally reviewed  Physical Exam   GEN: No acute distress.  Sitting up. No O2 Neck: + JVD Cardiac: RRR, + holosystolic murmur rubs, or gallops. Radial pulses 2+ bilaterally  Respiratory: Crackles and diminished breat sounds in bilateral lung bases  GI: Distended, soft MS: 2+ pitting edema in BLE; No deformity. Neuro:  Nonfocal  Psych: Normal affect   Labs    High Sensitivity Troponin:  No results for input(s): "TROPONINIHS" in the last 720 hours.   Chemistry Recent Labs  Lab 05/21/22 2057 05/22/22 0106 05/23/22 1821 05/24/22 0036 05/25/22 0044 05/26/22 0102  NA 140 140   < > 140 139 138  K 4.4 4.1   < > 4.0 3.9 4.2  CL 103 105   < > 96* 97* 96*  CO2 26 29   < > 32 35* 33*  GLUCOSE 180* 236*   < > 203* 182* 224*  BUN 79* 82*   < > 80* 81* 85*  CREATININE 2.51* 2.61*   < > 2.70* 2.98* 3.04*  CALCIUM 9.0 8.8*   < > 8.9 8.6* 8.6*  MG 2.3 2.2  --   --   --   --   PROT 5.9*  --   --   --   --   --   ALBUMIN 3.4*  --   --   --   --   --   AST 21  --   --   --   --   --   ALT 17  --   --   --   --   --   ALKPHOS 80  --   --   --   --   --   BILITOT 0.9  --   --   --   --   --   GFRNONAA 19* 18*   < > 17* 15* 15*  ANIONGAP 11 6   < > _0 < > = values in this interval not displayed.    Lipids No results for input(s): "CHOL", "TRIG", "HDL", "LABVLDL", "LDLCALC", "CHOLHDL" in the last 168 hours.  Hematology Recent Labs  Lab 05/19/22 1146 05/21/22 2057 05/22/22 0106  WBC 4.9 5.8 5.8  RBC 3.15* 3.12* 2.85*  HGB 9.9* 9.8* 9.1*  HCT 31.9* 31.8* 28.7*  MCV 101.3* 101.9* 100.7*  MCH 31.4 31.4 31.9  MCHC 31.0 30.8 31.7  RDW 16.4* 16.3* 16.3*  PLT 165 166 139*   Thyroid  Recent Labs  Lab 05/22/22 0106  TSH 2.716    BNP Recent Labs  Lab 05/21/22 2057  BNP 2,879.8*    DDimer No results for input(s): "DDIMER" in the last 168 hours.   Radiology    No results found.  Cardiac Studies   Echocardiogram 05/11/22 1. Left  ventricular ejection fraction, by estimation, is 30 to 35%. The  left ventricle demonstrates global hypokinesis. There is mild concentric  left ventricular hypertrophy. Left ventricular diastolic function could  not be evaluated.   2. Right ventricular systolic function is moderately reduced. The right  ventricular size is moderately enlarged. There is severely elevated  pulmonary artery systolic pressure.   3. The mitral valve is normal in structure. Moderate to severe mitral  valve regurgitation. No evidence of mitral stenosis. Moderate mitral  annular calcification.   4. Tricuspid valve regurgitation is severe.   5. The aortic valve is normal in structure. There is mild calcification  of the aortic valve. Aortic valve regurgitation is not visualized. Aortic  valve sclerosis is present, with no evidence of aortic valve stenosis.   6. The inferior vena cava is normal in size with greater than 50%  respiratory variability, suggesting right atrial pressure of 3 mmHg.  [IVC is dilated and no collapsing; RA pressure ~ 15 mmHg]  Comparison(s): 03/20/22 EF 25-30%. PA pressure 22mHg. Moderate-severe MR.  RV moderately enlarged.   Patient Profile     81y.o. 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 Pacing about 88% of the time. Also noted to have had frequent episodes of ectopic atrial tachycardia on device  interrogation from 03/2022. HR is better controlled now on a higher dose of carvedilol  - Suspect that reduced EF, mitral valve regurgitation are due to high pacing load and dyssynchrony, episodes of tachycardia  - Patient was seen by EP on 10/26-- recommended upgrade to a BiV PPM, but patient declined  - Patient was seen in the office on 11/29, and was noted to have ankle edema at that time. Complained of SOB, weakness, abdominal distention. Was direct admitted on 11/30  - BNP elevated to 2879.8. Patient has 2+ pitting edema, JVD, and crackles in lung bases on exam  -------------------------------------------------- - Started on IV lasix 80 mg BID continued on 12/2; admitted 11/30. Increased yesterday  today with tapering weight.  - Patient has baseline CKD stage IV-- creatinine 2.4->2.5->2.6->2.7->2.98->3.04 (bsl 1.8-2.0). -total net negative 6L;  191->188->188; wt was 86 kg in Oct, now 85. kg. Reds Vest 38% 12/4 - lasix holiday today for her renal fxn; aim for at least another day of IV 80 mg x1 ;then will transition to oral - This is a longstanding chronic issue that will not drastically improve - Continue carvedilol 18.75 mg BID (dose was increased in 03/2022 to help suppress atrial tachycardia. HR is well controlled and in the 60s this admission)  - Continue hydralazine 25 mg TID - Not on ACE/ARB/ARNI or spiro due to CKD, allergies. GFR too low for SGLT2i - Strict I/Os, daily weights  - PT/OT; very important. OOB to chair  Paroxysmal Atrial Fibrillation  - A-V paced - Continue eliquis 2.5 mg BID (dose reduced for age, renal function)   HTN - BB controlled with GDMT as above    Type 2 DM  - On home insulin   Plan for dispo soon as long as she can ambulate. She can lie flat. PT/OT recs.  Time Spent Directly with Patient:  I have spent a total of 35 minutes with the patient reviewing hospital notes, telemetry, EKGs, labs and examining the patient as well as establishing an  assessment and plan that was discussed personally with the patient.  > 50% of time was spent in direct patient care.   For questions or updates, please contact [place] Please consult [email or web address] for contact info under        Signed, Janina Mayo, MD  05/26/2022, 8:54 AM

## 2022-05-27 DIAGNOSIS — I5023 Acute on chronic systolic (congestive) heart failure: Secondary | ICD-10-CM | POA: Diagnosis not present

## 2022-05-27 DIAGNOSIS — I480 Paroxysmal atrial fibrillation: Secondary | ICD-10-CM | POA: Diagnosis not present

## 2022-05-27 LAB — GLUCOSE, CAPILLARY
Glucose-Capillary: 127 mg/dL — ABNORMAL HIGH (ref 70–99)
Glucose-Capillary: 190 mg/dL — ABNORMAL HIGH (ref 70–99)
Glucose-Capillary: 105 mg/dL — ABNORMAL HIGH (ref 70–99)
Glucose-Capillary: 223 mg/dL — ABNORMAL HIGH (ref 70–99)

## 2022-05-27 LAB — BASIC METABOLIC PANEL
Anion gap: 13 (ref 5–15)
BUN: 89 mg/dL — ABNORMAL HIGH (ref 8–23)
CO2: 33 mmol/L — ABNORMAL HIGH (ref 22–32)
Calcium: 9.1 mg/dL (ref 8.9–10.3)
Chloride: 94 mmol/L — ABNORMAL LOW (ref 98–111)
Creatinine, Ser: 3.08 mg/dL — ABNORMAL HIGH (ref 0.44–1.00)
GFR, Estimated: 15 mL/min — ABNORMAL LOW (ref >60)
Glucose, Bld: 144 mg/dL — ABNORMAL HIGH (ref 70–99)
Potassium: 4.2 mmol/L (ref 3.5–5.1)
Sodium: 140 mmol/L (ref 135–145)

## 2022-05-27 MED ORDER — METOLAZONE 5 MG PO TABS: 5.0000 mg | ORAL_TABLET | ORAL | Status: DC

## 2022-05-27 MED ORDER — TORSEMIDE 20 MG PO TABS
40.0000 mg | ORAL_TABLET | Freq: Every day | ORAL | Status: DC
  Administered 2022-05-28: 40 mg via ORAL
  Filled 2022-05-27: qty 2

## 2022-05-27 MED ORDER — ALUM & MAG HYDROXIDE-SIMETH 200-200-20 MG/5ML PO SUSP
30.0000 mL | Freq: Once | ORAL | Status: AC
  Administered 2022-05-27: 30 mL via ORAL
  Filled 2022-05-27: qty 30

## 2022-05-27 MED ORDER — FUROSEMIDE 10 MG/ML IJ SOLN
80.0000 mg | Freq: Once | INTRAMUSCULAR | Status: AC
  Administered 2022-05-27: 80 mg via INTRAVENOUS
  Filled 2022-05-27: qty 8

## 2022-05-27 NOTE — Progress Notes (Addendum)
   Heart Failure Stewardship Pharmacist Progress Note   PCP: Carol Ada, MD PCP-Cardiologist: Evalina Field, MD    HPI:  81 yo female with PMH significant for PAF, CHF, HTN, HLD, T2DM, CKD stage IV, anemia and hypothyroidism.  On 05/19/2022 she had a visit with her hematologist. She has anemia secondary to chronic renal insufficiency. She takes PO iron and is scheduled to receive Feraheme in 6 weeks in clinic.   On 05/20/2022 she had an appointment with the cardiologist. Her carvedilol dose was increased the month prior and she reported increased fatigue with the dose increase. She was referred to HF at that time, but she wished to not move forward with that decision. Her renal function had worsened and she continued to remain weak and volume-overloaded. She was instructed the following day to report to the hospital for management of her HF inpatient.   Upon arrival to the ED, she endorsed medication adherence, along with increased salt intake over the past few months. She is positive for LEE, dyspnea on exertion, and fatigue.  ECHO on 05/11/2022 showed EF of 30-35% with global hypokinesis, moderately reduced right ventricular systolic function, severely elevated pulmonary artery systolic pressure, severe TV regurgitation and moderate to severe MV regurgitation. Previous EF in 02/2022 was 25-30%.   ReDS elevated at 38% on 12/4 (normal 20-35%). IV lasix restarted.  Current HF Medications: Diuretic: furosemide 80 mg IV x 1, then torsemide 40 mg daily starting 12/7 + metolazone 5 mg qMon Beta Blocker: carvedilol 18.75 mg BID Other: hydralazine 25 mg TID + Imdur 30 mg daily  Prior to admission HF Medications: Diuretic: torsemide 20m daily Beta blocker: carvedilol 18.733mBID  Pertinent Lab Values: Serum creatinine 3.08, BUN 89, Potassium 4.2, Sodium 140, BNP 2,897.8, Magnesium 2.2, A1c 6.0 (07/2020)  Vital Signs: Weight: 190 lbs (admission weight: 191 lbs)  Blood pressure:  130/60s Heart rate: 60-110s - vpaced  I/O: -0.4L yesterday, net -6.6L  Medication Assistance / Insurance Benefits Check: Does the patient have prescription insurance?  Yes Type of insurance plan: [facility]  [facility] Is the [facility]? Yes Is the patient willing to transition their outpatient pharmacy [facility]?   Pending    Assessment: 1. Acute on chronic systolic CHF (LVEF 3049-17% due to NICM. NYHA class III symptoms. - Agree with diuretic plan as above, lasix 80 mg IV today, starting torsemide 40 mg daily tomorrow, and metolazone every Monday - Continue carvedilol 18.75 mg BID - No ACE/ARB/ARNI, MRA, or SGLT2i with renal dysfunction - Continue hydralazine 25 mg TID + Imdur 30 mg daily - Daily weights. Strict I/Os. Keep K>4 and Mg>2   Plan: 1) Medication changes recommended at this time: - Agree with changes  2) Patient assistance: -Copays: $35 for EnFerne Coeand Farxiga  3)  Education  - Patient has been educated on current HF medications and potential additions to HF medication regimen - Patient verbalizes understanding that over the next few months, these medication doses may change and more medications may be added to optimize HF regimen - Patient has been educated on basic disease state pathophysiology and goals of therapy   MeKerby NoraPharmD, BCPS Heart Failure Stewardship Pharmacist Phone ([phone]

## 2022-05-27 NOTE — Progress Notes (Signed)
   05/27/22 1538  Mobility  Activity Ambulated with assistance to bathroom  Level of Assistance Independent after set-up  Assistive Device Cane  Distance Ambulated (ft) 10 ft  Activity Response Tolerated well  Mobility Referral Yes  $Mobility charge 1 Mobility   Mobility Specialist Progress Note  Pt requesting to use BR. Left in BR with all needs met and instructions to pull string when ready to get up.   Lucious Groves Mobility Specialist  Please contact via SecureChat or Rehab office at [phone]

## 2022-05-27 NOTE — Progress Notes (Signed)
   05/27/22 1100  Mobility  Activity Ambulated with assistance to bathroom  Level of Assistance Modified independent, requires aide device or extra time  Assistive Device Cane  Distance Ambulated (ft) 15 ft  Activity Response Tolerated well  Mobility Referral Yes  $Mobility charge 1 Mobility   Mobility Specialist Progress Note  Pt requesting to use BR. Had no c/o pain throughout ambulation. Returned EOB w/ all needs met and call bell in reach.   Lucious Groves Mobility Specialist  Please contact via SecureChat or Rehab office at [phone]

## 2022-05-27 NOTE — Progress Notes (Signed)
   05/27/22 1203  Mobility  Activity Ambulated with assistance in hallway  Level of Assistance Contact guard assist, steadying assist  Assistive Device Cane  Distance Ambulated (ft) 50 ft  Activity Response Tolerated well  Mobility Referral Yes  $Mobility charge 1 Mobility   Mobility Specialist Progress Note  Pre-Mobility: 87% SpO2(RA) During Mobility: 92% SpO2 (2L) Post-Mobility: 92% SpO2(2L)  Pt was in bed and agreeable. Had c/o of SOB. Returned to EOB w/ all needs met and call bell in reach.   Lucious Groves Mobility Specialist  Please contact via SecureChat or Rehab office at [phone]

## 2022-05-27 NOTE — TOC Progression Note (Signed)
Transition of Care [facility]) - Progression Note    Patient Details  Name: Tricia Gonzales MRN: [id number] Date of Birth: 1941/05/29  Transition [facility]) CM/SW Contact  Loletha Grayer Beverely Pace, RN Phone Number: 05/27/2022, 11:29 AM  Clinical Narrative:   Case manager spoke with patient concerning discharge needs.She states that she goes to [facility] for therapy. Discussed need for oxygen at discharge. Patient understands, states she doesn't have a lot of room in her house for large tanks. CM shared this with [employer] Liaison, Cyril Mourning.     Expected Discharge Plan: Home/Self Care Barriers to Discharge: Continued Medical Work up  Expected Discharge Plan and Services Expected Discharge Plan: Home/Self Care   Discharge Planning Services: CM Consult Post Acute Care Choice: Durable Medical Equipment Living arrangements for the past 2 months: Single Family Home                 DME Arranged: Oxygen DME Agency: [employer] Date DME Agency Contacted: 05/27/22 Time DME Agency Contacted: 9030 Representative spoke with at DME Agency: Cyril Mourning HH Arranged: NA [place] Agency: NA         Social Determinants of Health (SDOH) Interventions Housing Interventions: Intervention Not Indicated Transportation Interventions: Intervention Not Indicated Alcohol Usage Interventions: Intervention Not Indicated (Score <7) Financial Strain Interventions: Intervention Not Indicated  Readmission Risk Interventions    08/20/2020    1:20 PM  Readmission Risk Prevention Plan  Transportation Screening Complete  PCP or Specialist Appt within 3-5 Days Complete  HRI or [place] Complete  Social Work Consult for [place] Planning/Counseling Complete  Palliative Care Screening Not Applicable  Medication Review Press photographer) Complete

## 2022-05-27 NOTE — Plan of Care (Signed)
Recommend Home O2 2L [place] as needed for O2 < 92%  Per PT note 05/26/2022 10:03 AM  Patient Saturations on Room Air at Rest = spO2 93%   Patient Saturations on Room Air while Ambulating = sp02 85% .  Rested and performed pursed lip breathing for 1 minute with sp02 at 86%.   Patient Saturations on 2 Liters of oxygen while Ambulating = sp02 93%

## 2022-05-27 NOTE — Progress Notes (Addendum)
Rounding Note    Patient Name: Tricia Gonzales Date of Encounter: 05/27/2022  [place] Cardiologist: Evalina Field, MD   Subjective   Did will with diuresis holiday, crt stable One more day of 80 mg IV lasix Hope to DC tomorrow on torsemide and home O2  Inpatient Medications    Scheduled Meds:  acetaminophen  1,000 mg Oral QPM   apixaban  2.5 mg Oral BID   brinzolamide  1 drop Right Eye TID   And   brimonidine  1 drop Right Eye TID   carvedilol  18.75 mg Oral BID WC   cholecalciferol  1,000 Units Oral Daily   cycloSPORINE  1 drop Both Eyes BID   febuxostat  40 mg Oral Q M,W,F   furosemide  80 mg Intravenous Once   gabapentin  100 mg Oral QHS   hydrALAZINE  25 mg Oral Q8H   insulin aspart  0-9 Units Subcutaneous TID WC   insulin glargine-yfgn  10 Units Subcutaneous QPM   isosorbide mononitrate  30 mg Oral Daily   levothyroxine  75 mcg Oral q morning   montelukast  10 mg Oral QHS   pantoprazole  40 mg Oral Daily   psyllium  1 packet Oral Daily   rOPINIRole  0.5 mg Oral QHS   rosuvastatin  5 mg Oral QHS   senna-docusate  2 tablet Oral Daily   [START ON 05/28/2022] torsemide  40 mg Oral Daily   traMADol  50 mg Oral QHS   Continuous Infusions:  PRN Meds: dicyclomine, ondansetron, traMADol   Vital Signs    Vitals:   05/26/22 2305 05/27/22 0125 05/27/22 0330 05/27/22 0732  BP: (!) 135/52  139/61 134/77  Pulse: 61  63 (!) 112  Resp: _0 Temp: 97.8 F (36.6 C)  98 F (36.7 C) 98 F (36.7 C)  TempSrc: Oral  Oral Oral  SpO2: 90%  96% 90%  Weight:  86.4 kg    Height:        Intake/Output Summary (Last 24 hours) at 05/27/2022 0824 Last data filed at 05/27/2022 0300 Gross per 24 hour  Intake 900 ml  Output 800 ml  Net 100 ml      05/27/2022    1:25 AM 05/26/2022    5:38 AM 05/25/2022    5:42 AM  Last 3 Weights  Weight (lbs) 190 lb 6.4 oz 188 lb 3.2 oz 191 lb 9.3 oz  Weight (kg) 86.365 kg 85.367 kg 86.9 kg      Telemetry    AV  paced rhythm at base rate- Personally Reviewed  ECG    Prior ECG AV dual paced rhythm, HR 69 BPM - personally reviewed  Physical Exam   GEN: No acute distress.  Sitting up. No O2 Neck: improved JVD; although at 90 degrees Cardiac: RRR, + holosystolic murmur rubs, or gallops. Radial pulses 2+ bilaterally  Respiratory: nl wob, mild decreased BS GI: Distended, soft MS: 2+ pitting edema in BLE; No deformity. Neuro:  Nonfocal  Psych: Normal affect   Labs    High Sensitivity Troponin:  No results for input(s): "TROPONINIHS" in the last 720 hours.   Chemistry Recent Labs  Lab 05/21/22 2057 05/22/22 0106 05/23/22 1821 05/25/22 0044 05/26/22 0102 05/27/22 0059  NA 140 140   < > 139 138 140  K 4.4 4.1   < > 3.9 4.2 4.2  CL 103 105   < > 97* 96* 94*  CO2 26  29   < > 35* 33* 33*  GLUCOSE 180* 236*   < > 182* 224* 144*  BUN 79* 82*   < > 81* 85* 89*  CREATININE 2.51* 2.61*   < > 2.98* 3.04* 3.08*  CALCIUM 9.0 8.8*   < > 8.6* 8.6* 9.1  MG 2.3 2.2  --   --   --   --   PROT 5.9*  --   --   --   --   --   ALBUMIN 3.4*  --   --   --   --   --   AST 21  --   --   --   --   --   ALT 17  --   --   --   --   --   ALKPHOS 80  --   --   --   --   --   BILITOT 0.9  --   --   --   --   --   GFRNONAA 19* 18*   < > 15* 15* 15*  ANIONGAP 11 6   < > _0 < > = values in this interval not displayed.    Lipids No results for input(s): "CHOL", "TRIG", "HDL", "LABVLDL", "LDLCALC", "CHOLHDL" in the last 168 hours.  Hematology Recent Labs  Lab 05/21/22 2057 05/22/22 0106  WBC 5.8 5.8  RBC 3.12* 2.85*  HGB 9.8* 9.1*  HCT 31.8* 28.7*  MCV 101.9* 100.7*  MCH 31.4 31.9  MCHC 30.8 31.7  RDW 16.3* 16.3*  PLT 166 139*   Thyroid  Recent Labs  Lab 05/22/22 0106  TSH 2.716    BNP Recent Labs  Lab 05/21/22 2057  BNP 2,879.8*    DDimer No results for input(s): "DDIMER" in the last 168 hours.   Radiology    No results found.  Cardiac Studies   Echocardiogram 05/11/22 1. Left  ventricular ejection fraction, by estimation, is 30 to 35%. The  left ventricle demonstrates global hypokinesis. There is mild concentric  left ventricular hypertrophy. Left ventricular diastolic function could  not be evaluated.   2. Right ventricular systolic function is moderately reduced. The right  ventricular size is moderately enlarged. There is severely elevated  pulmonary artery systolic pressure.   3. The mitral valve is normal in structure. Moderate to severe mitral  valve regurgitation. No evidence of mitral stenosis. Moderate mitral  annular calcification.   4. Tricuspid valve regurgitation is severe.   5. The aortic valve is normal in structure. There is mild calcification  of the aortic valve. Aortic valve regurgitation is not visualized. Aortic  valve sclerosis is present, with no evidence of aortic valve stenosis.   6. The inferior vena cava is normal in size with greater than 50%  respiratory variability, suggesting right atrial pressure of 3 mmHg.  [IVC is dilated and no collapsing; RA pressure ~ 15 mmHg]  Comparison(s): 03/20/22 EF 25-30%. PA pressure 71mHg. Moderate-severe MR.  RV moderately enlarged.   Patient Profile     81y.o. 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 Pacing about 88% of the time. Also noted to have had frequent episodes of ectopic atrial  tachycardia on device interrogation from 03/2022. HR is better controlled now on a higher dose of carvedilol  - Suspect that reduced EF, mitral valve regurgitation are due to high pacing load and dyssynchrony, episodes of tachycardia  - Patient was seen by EP on 10/26-- recommended upgrade to a BiV PPM, but patient declined  - Patient was seen in the office on 11/29, and was noted to have ankle edema at that time. Complained of SOB, weakness, abdominal distention. Was direct admitted on 11/30  - Initially BNP elevated to 2879.8. Patient has 2+ pitting edema, JVD, and crackles in lung bases on exam  -------------------------------------------------- - Started on IV lasix 80 mg BID continued on 12/2; admitted 11/30. Increased yesterday  today with tapering weight.  - Patient has baseline CKD stage IV-- creatinine 2.4->2.5->2.6->2.7->2.98->3.04 (bsl 1.8-2.0). -total net negative 6L;  191->188->188->190; wt was 86 kg in Oct, now 85. kg. Reds Vest 38% 12/4 - lasix holiday 12/5 for her renal fxn;  IV 80 mg x1 12/6, change to home oral torsemide 40 mg daily as well as metolazone 5 mg q Monday - This is a longstanding chronic issue that will not drastically improve - Continue carvedilol 18.75 mg BID (dose was increased in 03/2022 to help suppress atrial tachycardia. HR is well controlled and in the 60s this admission)  - Continue hydralazine 25 mg TID - Not on ACE/ARB/ARNI or spiro due to CKD, allergies. GFR too low for SGLT2i - Strict I/Os, daily weights  - PT/OT; very important. OOB to chair -O2 sat while ambulating is < 90%; working on home O2  Paroxysmal Atrial Fibrillation  - A-V paced - Continue eliquis 2.5 mg BID (dose reduced for age, renal function)   HTN - BB controlled with GDMT as above    Type 2 DM  - On home insulin   Plan for DC 12/7. Working on home O2. Has appt with Almyra Deforest 06/04/2022  Time Spent Directly with Patient:  I have spent a total of 35 minutes with the patient  reviewing hospital notes, telemetry, EKGs, labs and examining the patient as well as establishing an assessment and plan that was discussed personally with the patient.  > 50% of time was spent in direct patient care.   For questions or updates, please contact [place] Please consult [email or web address] for contact info under        Signed, Janina Mayo, MD  05/27/2022, 8:24 AM

## 2022-05-28 ENCOUNTER — Other Ambulatory Visit (HOSPITAL_COMMUNITY): Payer: Self-pay

## 2022-05-28 DIAGNOSIS — I340 Nonrheumatic mitral (valve) insufficiency: Secondary | ICD-10-CM | POA: Insufficient documentation

## 2022-05-28 DIAGNOSIS — I071 Rheumatic tricuspid insufficiency: Secondary | ICD-10-CM | POA: Insufficient documentation

## 2022-05-28 DIAGNOSIS — I50810 Right heart failure, unspecified: Secondary | ICD-10-CM | POA: Insufficient documentation

## 2022-05-28 DIAGNOSIS — I272 Pulmonary hypertension, unspecified: Secondary | ICD-10-CM | POA: Insufficient documentation

## 2022-05-28 LAB — BASIC METABOLIC PANEL
Anion gap: 7 (ref 5–15)
BUN: 95 mg/dL — ABNORMAL HIGH (ref 8–23)
CO2: 35 mmol/L — ABNORMAL HIGH (ref 22–32)
Calcium: 8.8 mg/dL — ABNORMAL LOW (ref 8.9–10.3)
Chloride: 95 mmol/L — ABNORMAL LOW (ref 98–111)
Creatinine, Ser: 3.35 mg/dL — ABNORMAL HIGH (ref 0.44–1.00)
GFR, Estimated: 13 mL/min — ABNORMAL LOW (ref >60)
Glucose, Bld: 200 mg/dL — ABNORMAL HIGH (ref 70–99)
Potassium: 3.9 mmol/L (ref 3.5–5.1)
Sodium: 137 mmol/L (ref 135–145)

## 2022-05-28 LAB — GLUCOSE, CAPILLARY
Glucose-Capillary: 167 mg/dL — ABNORMAL HIGH (ref 70–99)
Glucose-Capillary: 171 mg/dL — ABNORMAL HIGH (ref 70–99)
Glucose-Capillary: 138 mg/dL — ABNORMAL HIGH (ref 70–99)

## 2022-05-28 MED ORDER — HYDRALAZINE HCL 25 MG PO TABS
25.0000 mg | ORAL_TABLET | Freq: Three times a day (TID) | ORAL | 3 refills | Status: DC
  Filled 2022-05-28: qty 90, 30d supply, fill #0

## 2022-05-28 MED ORDER — METOLAZONE 5 MG PO TABS
5.0000 mg | ORAL_TABLET | ORAL | 6 refills | Status: DC
  Filled 2022-05-28: qty 12, 84d supply, fill #0

## 2022-05-28 MED ORDER — ISOSORBIDE MONONITRATE ER 30 MG PO TB24
30.0000 mg | ORAL_TABLET | Freq: Every day | ORAL | 3 refills | Status: DC
  Filled 2022-05-28: qty 90, 90d supply, fill #0

## 2022-05-28 NOTE — Progress Notes (Signed)
   Heart Failure Stewardship Pharmacist Progress Note   PCP: Carol Ada, MD PCP-Cardiologist: Evalina Field, MD    HPI:  81 yo female with PMH significant for PAF, CHF, HTN, HLD, T2DM, CKD stage IV, anemia and hypothyroidism.  On 05/19/2022 she had a visit with her hematologist. She has anemia secondary to chronic renal insufficiency. She takes PO iron and is scheduled to receive Feraheme in 6 weeks in clinic.   On 05/20/2022 she had an appointment with the cardiologist. Her carvedilol dose was increased the month prior and she reported increased fatigue with the dose increase. She was referred to HF at that time, but she wished to not move forward with that decision. Her renal function had worsened and she continued to remain weak and volume-overloaded. She was instructed the following day to report to the hospital for management of her HF inpatient.   Upon arrival to the ED, she endorsed medication adherence, along with increased salt intake over the past few months. She is positive for LEE, dyspnea on exertion, and fatigue.  ECHO on 05/11/2022 showed EF of 30-35% with global hypokinesis, moderately reduced right ventricular systolic function, severely elevated pulmonary artery systolic pressure, severe TV regurgitation and moderate to severe MV regurgitation. Previous EF in 02/2022 was 25-30%.   ReDS elevated at 38% on 12/4 (normal 20-35%). IV lasix restarted, now transitioned to torsemide + metolazone.  Current HF Medications: Diuretic: torsemide 40 mg daily + metolazone 5 mg qMon Beta Blocker: carvedilol 18.75 mg BID Other: hydralazine 25 mg TID + Imdur 30 mg daily  Prior to admission HF Medications: Diuretic: torsemide 73m daily Beta blocker: carvedilol 18.787mBID  Pertinent Lab Values: Serum creatinine 3.35, BUN 95, Potassium 3.9, Sodium 137, BNP 2,897.8, Magnesium 2.2, A1c 6.0 (07/2020)  Vital Signs: Weight: 188 lbs (admission weight: 191 lbs)  Blood pressure:  130-160/60s Heart rate: 60s - av-paced  I/O: -0.6L yesterday, net -7.7L  Medication Assistance / Insurance Benefits Check: Does the patient have prescription insurance?  Yes Type of insurance plan: [facility]  [facility] Is the [facility]? Yes Is the patient willing to [facility]?   Pending    Assessment: 1. Acute on chronic systolic CHF (LVEF 3065-68% due to NICM. NYHA class III symptoms. - Agree with diuretic plan as above - torsemide 40 mg daily and metolazone every Monday - Continue carvedilol 18.75 mg BID - No ACE/ARB/ARNI, MRA, or SGLT2i with renal dysfunction - Continue hydralazine 25 mg TID + Imdur 30 mg daily - Daily weights. Strict I/Os. Keep K>4 and Mg>2   Plan: 1) Medication changes recommended at this time: - Agree with changes, plan discharge today  2) Patient assistance: -Copays: $35 for EnFerne Coeand Farxiga  3)  Education  - Patient has been educated on current HF medications and potential additions to HF medication regimen - Patient verbalizes understanding that over the next few months, these medication doses may change and more medications may be added to optimize HF regimen - Patient has been educated on basic disease state pathophysiology and goals of therapy   MeKerby NoraPharmD, BCPS Heart Failure Stewardship Pharmacist Phone [phone]

## 2022-05-28 NOTE — Progress Notes (Signed)
   05/28/22 1208  Mobility  Activity Ambulated with assistance to bathroom  Level of Assistance Independent after set-up  Assistive Device Cane  Distance Ambulated (ft) 20 ft  Activity Response Tolerated well  Mobility Referral Yes  $Mobility charge 1 Mobility   Mobility Specialist Progress Note  Pt requesting to use BR. Had no c/o pain. Returned to chair w/ all needs met and call bell in reach.   Lucious Groves Mobility Specialist  Please contact via SecureChat or Rehab office at [phone]

## 2022-05-28 NOTE — TOC Progression Note (Signed)
Transition of Care [facility]) - Progression Note    Patient Details  Name: Tricia Gonzales MRN: [id number] Date of Birth: 1940-11-01  Transition of Care [facility]) CM/SW Contact  Loletha Grayer Beverely Pace, RN Phone Number: 05/28/2022, 11:08 AM  Clinical Narrative:    Case Manager was contacted by physical therapist requesting patient be setup with [place] versus outpt therapy. MD notified, CM contacted patient, discussed need for Home Health, and choice for agency. Referral called to Jenny Reichmann [facility] Liaison.    Expected Discharge Plan: [place] Barriers to Discharge: No Barriers Identified  Expected Discharge Plan and Services Expected Discharge Plan: [place]   Discharge Planning Services: CM Consult Post Acute Care Choice: Durable Medical Equipment Living arrangements for the past 2 months: Single Family Home                 DME Arranged: Oxygen DME Agency: [employer] Date DME Agency Contacted: 05/27/22 Time DME Agency Contacted: 1991 Representative spoke with at DME Agency: [place]: PT [place]: [place] Date [place]: 05/28/22 Time [place]: 54 Representative spoke with at [place]: Jenny Reichmann   Social Determinants of Health (SDOH) Interventions Housing Interventions: Intervention Not Indicated Transportation Interventions: Intervention Not Indicated Alcohol Usage Interventions: Intervention Not Indicated (Score <7) Financial Strain Interventions: Intervention Not Indicated  Readmission Risk Interventions    08/20/2020    1:20 PM  Readmission Risk Prevention Plan  Transportation Screening Complete  PCP or Specialist Appt within 3-5 Days Complete  HRI or [place] Complete  Social Work Consult for [place] Planning/Counseling Complete  Palliative Care Screening Not Applicable  Medication Review Press photographer) Complete

## 2022-05-28 NOTE — Discharge Summary (Addendum)
Discharge Summary    Patient ID: Tricia Gonzales MRN: [id number]; DOB: [birth year]  Admit date: 05/21/2022 Discharge date: 05/28/2022  PCP:  Tricia Gonzales, [place] Providers Cardiologist:  Tricia Field, MD  Electrophysiologist:  Tricia Peru, MD  {  Discharge Diagnoses    Principal Problem:   Acute systolic heart failure [facility]) Active Problems:   Diabetes mellitus type 2, controlled (Tricia Gonzales)   Hyperlipidemia   Hypertension   Obesity   Chronic kidney disease (CKD), stage III (moderate) (Tricia Gonzales)   Atrial fibrillation ([place])   Sinus node dysfunction (Tricia Gonzales)   Acute kidney injury superimposed on chronic kidney disease (Tricia Gonzales)   Pulmonary hypertension, unspecified (Tricia Gonzales)   Mitral regurgitation   Tricuspid regurgitation   RVF (right ventricular failure) (Tricia Gonzales)   Diagnostic Studies/Procedures    Echo 05/11/22: 1. Left ventricular ejection fraction, by estimation, is 30 to 35%. The  left ventricle demonstrates global hypokinesis. There is mild concentric  left ventricular hypertrophy. Left ventricular diastolic function could  not be evaluated.   2. Right ventricular systolic function is moderately reduced. The right  ventricular size is moderately enlarged. There is severely elevated  pulmonary artery systolic pressure.   3. The mitral valve is normal in structure. Moderate to severe mitral  valve regurgitation. No evidence of mitral stenosis. Moderate mitral  annular calcification.   4. Tricuspid valve regurgitation is severe.   5. The aortic valve is normal in structure. There is mild calcification  of the aortic valve. Aortic valve regurgitation is not visualized. Aortic  valve sclerosis is present, with no evidence of aortic valve stenosis.   6. The inferior vena cava is normal in size with greater than 50%  respiratory variability, suggesting right atrial pressure of 3 mmHg.  _____________   History of Present Illness     Tricia Gonzales is a 81 y.o. female  with PAF and CHB s/p PPM, chronic systolic heart failure with mod/severe MR, severe TR, HTN, HLD, DM2, and CKD stage IV who is being seen 05/21/2022 for the evaluation of acute on chronic systolic heart failure.   [name] a 81 y.o. female with PAF and CHB s/p PPM, NICM with chronic systolic heart failure and mod/severe MR, severe TR, HTN, HLD, DM2, hypothyroidism and CKD stage IV who is being seen 05/21/2022 for the evaluation of acute on chronic systolic heart failure.   Briefly, patient was seen at her outpatient cardiology office yesterday 05/20/22 with complaints of persistent fatigue and volume overload.  She had recently increased her Coreg to 18.75 mg twice daily for control of ectopic atrial tachycardias that were thought to be contributing to worsening cardiomyopathy.  States that she has been taking 40 mg of her torsemide daily without missed doses but does not feel like her urine output has responded.  In addition she noticed that her weight continues to increase despite diuretic doses.  She does state that she has been eating more processed foods that could have increased salt given decreased appetite over the last few months.  Review of systems notable for paroxysmal nocturnal dyspnea, lower extremity edema, dyspnea on exertion, fatigue.  She denies worsening chest pain, palpitations, syncope, fevers, or chills.   Labs resulting from her outpatient visit demonstrated acute on chronic kidney injury with creatinine of 2.4 from 1.85 (baseline appears to be around 1.8), BUN is elevated to 76.  Sodium 132.  Given worsening renal function in the setting of volume overloaded, she called that she should  go to the ED to be evaluated for IV diuresis however she preferred for direct admission.  She is admitted for acute on chronic systolic heart failure and IV diuresis in the setting of cardiorenal syndrome.  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 This was a significant decrease from prior echo of 50%.  She is paced about 88% of the time with frequent episodes of ectopic atrial tachycardia on device interrogation from 03/2022. She has dyssynchrony. HR noted to be better controlled on higher dose of coreg 18.75 mg BID. Suspicion for pacemaker mediated cardiomyopathy. EP consulted and agreed with increasing coreg. Upgrade to BiV pacer was recommended. Paroxysmal atrial fibrillation also noted. Pt intolerant to amiodarone in the past. Anticoagulated with eliquis 2.5 mg BID.  ReDS elevated to 38% on 12/4 (normal 20-35%). She was diuresed with IV lasix and metolazone. Unfortunately her renal function declined from baseline. Baseline creatinine felt to be 1.8-2.0, discharge creatinine 3.35. I will arrange for a BMP early next week.  She has declined the advanced heart failure clinic when seen by Almyra Deforest PAC. Appreciate guidance from AHF TOC pharmD, she has an appt early next week.  GDMT now includes BB, hydralazine, imdur Renal function precludes ACEI/ARB/ARNI/SGLT2i She was transitioned to torsemide and metolazone on Mondays   Pt seen and examined by Dr. Harl Bowie and felt stable for discharge. Follow up has been arranged.   Did the patient have an acute coronary syndrome (MI, NSTEMI, STEMI, etc) this admission?:  No                               Did the patient have a percutaneous coronary intervention (stent / angioplasty)?:  No.        The patient will be scheduled for a TOC follow up appointment in 7  days.  A message has been sent to the [facility] and Scheduling Pool at the office where the  patient should be seen for follow up.  _____________  Discharge Vitals Blood pressure (!) 135/54, pulse 62, temperature 98.2 F (36.8 C), temperature source Oral, resp. rate 18, height _0  (1.473 m), weight 85.5 kg, SpO2 100 %.  Filed Weights   05/26/22 0538 05/27/22 0125 05/28/22 0038  Weight: 85.4 kg 86.4 kg 85.5 kg    Labs & Radiologic Studies    CBC No results for input(s): "WBC", "NEUTROABS", "HGB", "HCT", "MCV", "PLT" in the last 72 hours. Basic Metabolic Panel Recent Labs    05/27/22 0059 05/28/22 0104  NA 140 137  K 4.2 3.9  CL 94* 95*  CO2 33* 35*  GLUCOSE 144* 200*  BUN 89* 95*  CREATININE 3.08* 3.35*  CALCIUM 9.1 8.8*   Liver Function Tests No results for input(s): "AST", "ALT", "ALKPHOS", "BILITOT", "PROT", "ALBUMIN" in the last 72 hours. No results for input(s): "LIPASE", "AMYLASE" in the last 72 hours. High Sensitivity Troponin:   No results for input(s): "TROPONINIHS" in the last 720 hours.  BNP Invalid input(s): "POCBNP" D-Dimer No results for input(s): "DDIMER" in the last 72 hours. Hemoglobin A1C No results for  input(s): "HGBA1C" in the last 72 hours. Fasting Lipid Panel No results for input(s): "CHOL", "HDL", "LDLCALC", "TRIG", "CHOLHDL", "LDLDIRECT" in the last 72 hours. Thyroid Function Tests No results for input(s): "TSH", "T4TOTAL", "T3FREE", "THYROIDAB" in the last 72 hours.  Invalid input(s): "FREET3" _____________  DG CHEST PORT 1 VIEW  Result Date: 05/22/2022 CLINICAL DATA:  Shortness of breath EXAM: PORTABLE CHEST 1 VIEW COMPARISON:  Radiograph 08/15/2020 FINDINGS: Unchanged enlarged cardiac silhouette with dual chamber pacemaker leads. There is no focal airspace disease. Central pulmonary vascular congestion. No new airspace disease. Trace pleural effusions. No evidence of pneumothorax. IMPRESSION: Cardiomegaly with pulmonary vascular congestion and trace pleural effusions. Electronically Signed   By: Maurine Simmering M.D.   On: 05/22/2022  10:30   ECHOCARDIOGRAM LIMITED  Result Date: 05/11/2022    ECHOCARDIOGRAM LIMITED REPORT   Patient Name:   LAPARIS DURRETT Inch Date of Exam: 05/11/2022 Medical Rec #:  [id number]     Height:       58.0 in Accession #:    [id number]    Weight:       191.0 lb Date of Birth:  08/23/1940     BSA:          1.786 m Patient Age:    42 years      BP:           152/98 mmHg Patient Gender: F             HR:           76 bpm. Exam Location:  [address] Procedure: Limited Echo, Cardiac Doppler and Limited Color Doppler Indications:    I51.7 Enlarged RV. Evaluate MR.  History:        Patient has prior history of Echocardiogram examinations, most                 recent 03/20/2022. CHF, Pacemaker, Arrythmias:Bradycardia, Sinus                 node dysfunction, Atrial Flutter and Atrial Fibrillation; Risk                 Factors:Hypertension, Diabetes, Dyslipidemia and Former Smoker.                 Pericarditis. Anemia. Obesity. Sjogren's disease. CKD stage 3.  Sonographer:    Basilia Jumbo BS, RDCS Referring Phys: [phone] [place]  1. Left ventricular ejection fraction, by estimation, is 30 to 35%. The left ventricle demonstrates global hypokinesis. There is mild concentric left ventricular hypertrophy. Left ventricular diastolic function could not be evaluated.  2. Right ventricular systolic function is moderately reduced. The right ventricular size is moderately enlarged. There is severely elevated pulmonary artery systolic pressure.  3. The mitral valve is normal in structure. Moderate to severe mitral valve regurgitation. No evidence of mitral stenosis. Moderate mitral annular calcification.  4. Tricuspid valve regurgitation is severe.  5. The aortic valve is normal in structure. There is mild calcification of the aortic valve. Aortic valve regurgitation is not visualized. Aortic valve sclerosis is present, with no evidence of aortic valve stenosis.  6. The inferior vena cava is normal in size with greater than 50%  respiratory variability, suggesting right atrial pressure of 3 mmHg. Comparison(s): 03/20/22 EF 25-30%. PA pressure 59mHg. Moderate-severe MR. RV moderately enlarged. FINDINGS  Left Ventricle: Left ventricular ejection fraction, by estimation, is 30 to 35%. The left ventricle demonstrates global hypokinesis. The left ventricular internal cavity size was normal in size.  There is mild concentric left ventricular hypertrophy. Left ventricular diastolic function could not be evaluated. Left ventricular diastolic function could not be evaluated due to mitral annular calcification (moderate or greater). Right Ventricle: The right ventricular size is moderately enlarged. No increase in right ventricular wall thickness. Right ventricular systolic function is moderately reduced. There is severely elevated pulmonary artery systolic pressure. The tricuspid regurgitant velocity is 3.44 m/s, and with an assumed right atrial pressure of 15 mmHg, the estimated right ventricular systolic pressure is 75.0 mmHg. Left Atrium: Left atrial size was normal in size. Right Atrium: Right atrial size was normal in size. Pericardium: There is no evidence of pericardial effusion. Mitral Valve: The mitral valve is normal in structure. Moderate mitral annular calcification. Moderate to severe mitral valve regurgitation, with posteriorly-directed jet. No evidence of mitral valve stenosis. Tricuspid Valve: The tricuspid valve is normal in structure. Tricuspid valve regurgitation is severe. No evidence of tricuspid stenosis. Aortic Valve: The aortic valve is normal in structure. There is mild calcification of the aortic valve. Aortic valve regurgitation is not visualized. Aortic valve sclerosis is present, with no evidence of aortic valve stenosis. Pulmonic Valve: The pulmonic valve was normal in structure. Pulmonic valve regurgitation is not visualized. No evidence of pulmonic stenosis. Aorta: The aortic root is normal in size and structure.  Venous: The inferior vena cava is normal in size with greater than 50% respiratory variability, suggesting right atrial pressure of 3 mmHg. IAS/Shunts: No atrial level shunt detected by color flow Doppler. LEFT VENTRICLE PLAX 2D LVIDd:         5.50 cm   Diastology LVIDs:         4.40 cm   LV e' medial:    6.32 cm/s LV PW:         1.10 cm   LV E/e' medial:  15.8 LV IVS:        1.00 cm   LV e' lateral:   10.50 cm/s LVOT diam:     2.40 cm   LV E/e' lateral: 9.5 LVOT Area:     4.52 cm  RIGHT VENTRICLE            IVC RV Basal diam:  4.30 cm    IVC diam: 2.70 cm RV Mid diam:    3.60 cm RV S prime:     4.95 cm/s TAPSE (M-mode): 1.2 cm RVSP:           62.3 mmHg LEFT ATRIUM         Index       RIGHT ATRIUM            Index LA diam:    5.70 cm 3.19 cm/m  RA Pressure: 15.00 mmHg                                 RA Area:     22.80 cm                                 RA Volume:   72.90 ml   40.82 ml/m   AORTA Ao Root diam: 3.50 cm MITRAL VALVE                  TRICUSPID VALVE  TR Peak grad:   47.3 mmHg MV Decel Time: 165 msec       TR Vmax:        344.00 cm/s MR Peak grad:    131.8 mmHg   Estimated RAP:  15.00 mmHg MR Mean grad:    82.5 mmHg    RVSP:           62.3 mmHg MR Vmax:         574.00 cm/s MR Vmean:        428.0 cm/s   SHUNTS MR PISA:         4.02 cm     Systemic Diam: 2.40 cm MR PISA Eff ROA: 22 mm MR PISA Radius:  0.80 cm MV E velocity: 99.70 cm/s MV A velocity: 49.30 cm/s MV E/A ratio:  2.02 Kardie Tobb DO Electronically signed by Berniece Salines DO Signature Date/Time: 05/11/2022/2:44:00 PM    Final    CUP PACEART REMOTE DEVICE CHECK  Result Date: 04/29/2022 Scheduled remote reviewed. Normal device function.  Next remote 91 days. LA  Disposition   Pt is being discharged home today in good condition.  Follow-up Plans & Appointments     Follow-up Information     [facility]. Go in 9 day(s).   Specialty: Cardiology Why: Hospital  follow up 06/02/22 @ 12 noon Please bring a current medication list to appointment FREE valet parking, Entrance C, off [employer] information: [address] [id number] [place], [facility] Follow up.   Specialty: [place] Why: A representative from [facility] will contact you to arrange start date and time for your therapy. Contact information: [place] [address] [phone]         Tricia Ada, MD. Go on 06/09/2022.   Specialty: Family Medicine Why: _0 :45am Contact information: [place] [address] [phone]                Discharge Instructions     Diet - low sodium heart healthy   Complete by: As directed    Diet - low sodium heart healthy   Complete by: As directed    Increase activity slowly   Complete by: As directed    Increase activity slowly   Complete by: As directed         Discharge Medications   Allergies as of 05/28/2022       Reactions   Norvasc [amlodipine Besylate] Shortness Of Breath   Other Rash, Other (See Comments)   Pt voiced she is allergic to microfiber materials/ blankets/sheets!!   Amlodipine Besylate    Other reaction(s): SOB   Avapro [irbesartan] Other (See Comments)   Headaches   Glucophage [metformin Hydrochloride] Other (See Comments)   "increases creatinine" and diarrhea   Lisinopril    Headache   Losartan Other (See Comments)   Headaches   Lumigan [bimatoprost] Other (See Comments)   Eye burning and irritation   Morphine And Related Other (See Comments)   "Hallucinations"   Simvastatin Other (See Comments)   Body aches   Trulicity [dulaglutide] Other (See Comments)   Severe mood swings        Medication List     TAKE these medications    Accu-Chek Aviva Plus test strip Generic drug: glucose blood 1 each by Other route daily.    acetaminophen  650 MG CR tablet Commonly known as: TYLENOL Take 1,300 mg by mouth every evening.   BD Pen Needle Nano U/F 32G X 4 MM Misc Generic drug: Insulin Pen Needle See admin instructions.   carvedilol 12.5 MG tablet Commonly known as: COREG Take 1 1/2 tablet ( 18.75 MG ) by mouth twice a day What changed:  how much to take how to take this when to take this   cycloSPORINE 0.05 % ophthalmic emulsion Commonly known as: RESTASIS Place 1 drop into both eyes 2 (two) times daily as needed (eye dryness).   dicyclomine 10 MG capsule Commonly known as: BENTYL Take 1 capsule (10 mg total) by mouth every 8 (eight) hours as needed for spasms.   Eliquis 2.5 MG Tabs tablet Generic drug: apixaban TAKE ONE TABLET TWICE DAILY   febuxostat 40 MG tablet Commonly known as: ULORIC Take 1 tablet (40 mg total) by mouth every Monday, Wednesday, and Friday.   ferrous sulfate 325 (65 FE) MG EC tablet Take 1 tablet (325 mg total) by mouth 2 (two) times daily. What changed: when to take this   gabapentin 400 MG capsule Commonly known as: NEURONTIN Take 1 capsule (400 mg total) by mouth at bedtime. What changed: when to take this   hydrALAZINE 25 MG tablet Commonly known as: APRESOLINE Take 1 tablet (25 mg total) by mouth every 8 (eight) hours.   isosorbide mononitrate 30 MG 24 hr tablet Commonly known as: IMDUR Take 1 tablet (30 mg total) by mouth daily. Start taking on: May 29, 2022   levothyroxine 75 MCG tablet Commonly known as: SYNTHROID Take 75 mcg by mouth at bedtime.   lidocaine 5 % Commonly known as: LIDODERM Place 1-3 patches onto the skin See admin instructions. Apply 1-3 patches transdermally every 24 hours as needed for pain and remove & discard patches within 12 hours or as directed by MD   metolazone 5 MG tablet Commonly known as: ZAROXOLYN Take 1 tablet (5 mg total) by mouth every Monday. Start taking on: June 01, 2022   montelukast 10 MG  tablet Commonly known as: SINGULAIR Take 1 tablet (10 mg total) by mouth at bedtime.   nystatin powder Commonly known as: Nyamyc APPLY TOPICALLY TO LEFT ABDOMINAL FOLD AND GROIN TWICE A DAY What changed:  how much to take how to take this when to take this reasons to take this additional instructions   ondansetron 4 MG disintegrating tablet Commonly known as: ZOFRAN-ODT Take 1 tablet (4 mg total) by mouth every 8 (eight) hours as needed for nausea or vomiting.   pantoprazole 40 MG tablet Commonly known as: PROTONIX Take 40 mg by mouth daily.   pentoxifylline 400 MG CR tablet Commonly known as: TRENTAL Take 400 mg by mouth daily. With a meal   rOPINIRole 0.5 MG tablet Commonly known as: REQUIP Take 0.5 mg by mouth at bedtime.   rosuvastatin 5 MG tablet Commonly known as: CRESTOR Take 1 tablet (5 mg total) by mouth daily.   sennosides-docusate sodium 8.6-50 MG tablet Commonly known as: SENOKOT-S Take 1 tablet by mouth daily.   SF 5000 Plus 1.1 % Crea dental cream Generic drug: sodium fluoride Take by mouth as directed.   Simbrinza 1-0.2 % Susp Generic drug: Brinzolamide-Brimonidine Place 1 drop into the right eye 2 (two) times daily.   torsemide 20 MG tablet Commonly known as: DEMADEX Take 2 tablets (40 mg total) by mouth daily.   traMADol 50 MG tablet Commonly known as: ULTRAM Take 50  mg by mouth 2 (two) times daily.   Tyler Aas FlexTouch 100 UNIT/ML FlexTouch Pen Generic drug: insulin degludec Inject 8-10 Units into the skin every evening.   Vitamin D3 25 MCG (1000 UT) Caps Take 1,000 Units by mouth daily.               Durable Medical Equipment  (From admission, onward)           Start     Ordered   05/28/22 1351  DME Oxygen  Once       Question Answer Comment  Length of Need Lifetime   Mode or (Route) Nasal cannula   Liters per Minute 2   Frequency Continuous (stationary and portable oxygen unit needed)   Oxygen delivery system Gas       05/28/22 1351               Outstanding Labs/Studies   BMP early next week  Duration of Discharge Encounter   Greater than 30 minutes including physician time.  Signed, Tami Lin Jaedyn Marrufo, PA 05/28/2022, 1:51 PM

## 2022-05-28 NOTE — Progress Notes (Signed)
Rounding Note    Patient Name: Tricia Gonzales Date of Encounter: 05/28/2022  [place] Cardiologist: Evalina Field, MD   Subjective   DC today plan on torsemide and home O2 was discussed She worries about mobility at home; discussed may need to start planning long term care/assisted living  Inpatient Medications    Scheduled Meds:  acetaminophen  1,000 mg Oral QPM   apixaban  2.5 mg Oral BID   brinzolamide  1 drop Right Eye TID   And   brimonidine  1 drop Right Eye TID   carvedilol  18.75 mg Oral BID WC   cholecalciferol  1,000 Units Oral Daily   cycloSPORINE  1 drop Both Eyes BID   febuxostat  40 mg Oral Q M,W,F   gabapentin  100 mg Oral QHS   hydrALAZINE  25 mg Oral Q8H   insulin aspart  0-9 Units Subcutaneous TID WC   insulin glargine-yfgn  10 Units Subcutaneous QPM   isosorbide mononitrate  30 mg Oral Daily   levothyroxine  75 mcg Oral q morning   [START ON 06/01/2022] metolazone  5 mg Oral Q Mon   montelukast  10 mg Oral QHS   pantoprazole  40 mg Oral Daily   psyllium  1 packet Oral Daily   rOPINIRole  0.5 mg Oral QHS   rosuvastatin  5 mg Oral QHS   senna-docusate  2 tablet Oral Daily   torsemide  40 mg Oral Daily   traMADol  50 mg Oral QHS   Continuous Infusions:  PRN Meds: dicyclomine, ondansetron, traMADol   Vital Signs    Vitals:   05/27/22 2017 05/27/22 2348 05/28/22 0038 05/28/22 0355  BP: (!) 127/51 (!) 140/62  (!) 160/66  Pulse: 61   62  Resp: _0 Temp: 97.7 F (36.5 C) 97.7 F (36.5 C)  98.5 F (36.9 C)  TempSrc: Oral Oral  Oral  SpO2: 99% 96%  98%  Weight:   85.5 kg   Height:        Intake/Output Summary (Last 24 hours) at 05/28/2022 0901 Last data filed at 05/28/2022 0356 Gross per 24 hour  Intake 360 ml  Output 1100 ml  Net -740 ml      05/28/2022   12:38 AM 05/27/2022    1:25 AM 05/26/2022    5:38 AM  Last 3 Weights  Weight (lbs) 188 lb 8 oz 190 lb 6.4 oz 188 lb 3.2 oz  Weight (kg) 85.503 kg 86.365 kg  85.367 kg      Telemetry    AV paced rhythm at base rate- Personally Reviewed  ECG    Prior ECG AV dual paced rhythm, HR 69 BPM - personally reviewed  Physical Exam   GEN: No acute distress.  Sitting up. No O2 Neck: improved JVD Cardiac: RRR, + holosystolic murmur rubs, or gallops. Radial pulses 2+ bilaterally  Respiratory: nl wob, mild decreased BS GI: Distended, soft MS: no significant pitting edema BL; No deformity. Neuro:  Nonfocal  Psych: Normal affect   Labs    High Sensitivity Troponin:  No results for input(s): "TROPONINIHS" in the last 720 hours.   Chemistry Recent Labs  Lab 05/21/22 2057 05/22/22 0106 05/23/22 1821 05/26/22 0102 05/27/22 0059 05/28/22 0104  NA 140 140   < > 138 140 137  K 4.4 4.1   < > 4.2 4.2 3.9  CL 103 105   < > 96* 94* 95*  CO2 26 29   < >  33* 33* 35*  GLUCOSE 180* 236*   < > 224* 144* 200*  BUN 79* 82*   < > 85* 89* 95*  CREATININE 2.51* 2.61*   < > 3.04* 3.08* 3.35*  CALCIUM 9.0 8.8*   < > 8.6* 9.1 8.8*  MG 2.3 2.2  --   --   --   --   PROT 5.9*  --   --   --   --   --   ALBUMIN 3.4*  --   --   --   --   --   AST 21  --   --   --   --   --   ALT 17  --   --   --   --   --   ALKPHOS 80  --   --   --   --   --   BILITOT 0.9  --   --   --   --   --   GFRNONAA 19* 18*   < > 15* 15* 13*  ANIONGAP 11 6   < > _0 < > = values in this interval not displayed.    Lipids No results for input(s): "CHOL", "TRIG", "HDL", "LABVLDL", "LDLCALC", "CHOLHDL" in the last 168 hours.  Hematology Recent Labs  Lab 05/21/22 2057 05/22/22 0106  WBC 5.8 5.8  RBC 3.12* 2.85*  HGB 9.8* 9.1*  HCT 31.8* 28.7*  MCV 101.9* 100.7*  MCH 31.4 31.9  MCHC 30.8 31.7  RDW 16.3* 16.3*  PLT 166 139*   Thyroid  Recent Labs  Lab 05/22/22 0106  TSH 2.716    BNP Recent Labs  Lab 05/21/22 2057  BNP 2,879.8*    DDimer No results for input(s): "DDIMER" in the last 168 hours.   Radiology    No results found.  Cardiac Studies   Echocardiogram  05/11/22 1. Left ventricular ejection fraction, by estimation, is 30 to 35%. The  left ventricle demonstrates global hypokinesis. There is mild concentric  left ventricular hypertrophy. Left ventricular diastolic function could  not be evaluated.   2. Right ventricular systolic function is moderately reduced. The right  ventricular size is moderately enlarged. There is severely elevated  pulmonary artery systolic pressure.   3. The mitral valve is normal in structure. Moderate to severe mitral  valve regurgitation. No evidence of mitral stenosis. Moderate mitral  annular calcification.   4. Tricuspid valve regurgitation is severe.   5. The aortic valve is normal in structure. There is mild calcification  of the aortic valve. Aortic valve regurgitation is not visualized. Aortic  valve sclerosis is present, with no evidence of aortic valve stenosis.   6. The inferior vena cava is normal in size with greater than 50%  respiratory variability, suggesting right atrial pressure of 3 mmHg.  [IVC is dilated and no collapsing; RA pressure ~ 15 mmHg]  Comparison(s): 03/20/22 EF 25-30%. PA pressure 11mHg. Moderate-severe MR.  RV moderately enlarged.   Patient Profile     81y.o. 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 Pacing about 88% of the time. Also noted to have had frequent episodes of  ectopic atrial tachycardia on device interrogation from 03/2022. HR is better controlled now on a higher dose of carvedilol  - Suspect that reduced EF, mitral valve regurgitation are due to high pacing load and dyssynchrony, episodes of tachycardia  - Patient was seen by EP on 10/26-- recommended upgrade to a BiV PPM, but patient declined  - Patient was seen in the office on 11/29, and was noted to have ankle edema at that time. Complained of SOB, weakness, abdominal distention. Was direct admitted on 11/30  - Initially BNP elevated to 2879.8. Patient has 2+ pitting edema, JVD, and crackles in lung bases on exam  -------------------------------------------------- - Started on IV lasix 80 mg BID continued on 12/2; admitted 11/30. Increased yesterday  today with tapering weight.  - Patient has baseline CKD stage IV-- creatinine 2.4->2.5->2.6->2.7->2.98->3.04->3.35 (bsl 1.8-2.0). -total net negative 7.8L;  wt was 86 kg in Oct, now 85. Kg, no real changes. Reds Vest 38% 12/4 - lasix holiday 12/5 for her renal fxn;  IV 80 mg x1 12/6, change to home oral torsemide 40 mg daily as well as metolazone 5 mg q Monday - This is a longstanding chronic issue that will not drastically improve - Continue carvedilol 18.75 mg BID (dose was increased in 03/2022 to help suppress atrial tachycardia. HR is well controlled and in the 60s this admission)  - Continue hydralazine 25 mg TID - Not on ACE/ARB/ARNI or spiro due to CKD, allergies. GFR too low for SGLT2i - Strict I/Os, daily weights  - PT/OT; very important. OOB to chair -O2 sat while ambulating is < 90%; working on home O2  Paroxysmal Atrial Fibrillation  - A-V paced - Continue eliquis 2.5 mg BID (dose reduced for age, renal function)   HTN - BB controlled with GDMT as above    Type 2 DM  - On home insulin   Plan for DC 12/7. Working on home O2. Has appt with Almyra Deforest 06/04/2022  Time Spent Directly with Patient:  I have spent a total of 35 minutes  with the patient reviewing hospital notes, telemetry, EKGs, labs and examining the patient as well as establishing an assessment and plan that was discussed personally with the patient.  > 50% of time was spent in direct patient care.   For questions or updates, please contact [place] Please consult [email or web address] for contact info under        Signed, Janina Mayo, MD  05/28/2022, 9:01 AM

## 2022-05-28 NOTE — Progress Notes (Signed)
Physical Therapy Treatment Patient Details Name: Tricia Gonzales MRN: [id number] DOB: [birth year] Today's Date: 05/28/2022   History of Present Illness Pt is an 81 y.o. female admitted 05/21/22 with CHF. PMH includes PAF, CHF s/p PPM, CHF, HTN, HLD, DM2, CKD IV.    PT Comments    Pt supine in bed on entry, reports going home today but is concerned about getting up steps into home, especially with supplemental O2. Pt is mod I for bed mobility and supervision for transfers. Pt is able to ambulate in hallway at supervision level but fatigues before getting to staircase. After standing rest break pt able to ambulate back to room, due to new supplemental O2 need had her pull O2 caddy for part of the walk. Pt reports it is "more difficult" and needs min guard for safety. Once back in room, utilized single step for step training. Pt able to ascend descend steps forward and sideways with help of rail support, however will need assistance to manage supplemental O2 source. Pt to contact son for assistance with discharge. PT changing recommendation to HHPT to work on safe navigation of steps with supplemental O2.     Recommendations for follow up therapy are one component of a multi-disciplinary discharge planning process, led by the attending physician.  Recommendations may be updated based on patient status, additional functional criteria and insurance authorization.  Follow Up Recommendations  Home health PT     Assistance Recommended at Discharge Intermittent Supervision/Assistance  Patient can return home with the following Assistance with cooking/housework;Assist for transportation;Help with stairs or ramp for entrance   Equipment Recommendations  None recommended by PT (home O2?)       Precautions / Restrictions Precautions Precautions: Fall;Other (comment) Precaution Comments: Watch SpO2 (does not wear O2 baseline) Restrictions Weight Bearing Restrictions: No     Mobility  Bed  Mobility Overal bed mobility: Needs Assistance Bed Mobility: Supine to Sit     Supine to sit: Modified independent (Device/Increase time)     General bed mobility comments: HoB elevated    Transfers Overall transfer level: Needs assistance Equipment used: Straight cane Transfers: Sit to/from Stand Sit to Stand: Supervision           General transfer comment: good power up and self steadying    Ambulation/Gait Ambulation/Gait assistance: Min guard, Supervision Gait Distance (Feet): 75 Feet (x2) Assistive device: Straight cane Gait Pattern/deviations: Step-through pattern, Decreased stride length, Wide base of support Gait velocity: Decreased Gait velocity interpretation: <1.8 ft/sec, indicate of risk for recurrent falls   General Gait Details: supervision for ambulation with cane only, min guard for ambulation pulling oxygen caddy, pt reporting it is difficult to manage oxygen   Stairs Stairs: Yes Stairs assistance: Min guard Stair Management: One rail Right Number of Stairs: 1 (x3 up and over) General stair comments: pt not able to ambulate all the way to staircase due to fatigue, single step brought to her room and utilized foot board as railing, pt able to utilize rail and cane forwards or both hands on the rail sideways, however pt unsure how she will manage oxygen bottle as well, recommend that son be there to help with bringing O2 into home at discharge      Balance Overall balance assessment: Needs assistance   Sitting balance-Leahy Scale: Good     Standing balance support: No upper extremity supported, Single extremity supported, During functional activity Standing balance-Leahy Scale: Fair Standing balance comment: can static stand and take steps without UE support,  preference for [facility]                            Cognition Arousal/Alertness: Awake/alert Behavior During Therapy: WFL for tasks assessed/performed Overall Cognitive Status: Within  Functional Limits for tasks assessed                                             General Comments General comments (skin integrity, edema, etc.): Pt requires 2L O2 via [place] to maintain SpO2 > 90%O2 throughout session, discussed energy conservation and change in recommendation to HHPT to aide in stair training with O2 so that she can be independent with getting in and out of her home for driving      Pertinent Vitals/Pain Pain Assessment Pain Assessment: No/denies pain     PT Goals (current goals can now be found in the care plan section) Acute Rehab PT Goals Patient Stated Goal: return home without need for supplemental oxygen PT Goal Formulation: With patient Time For Goal Achievement: 06/09/22 Potential to Achieve Goals: Good Progress towards PT goals: Progressing toward goals    Frequency    Min 3X/week      PT Plan Discharge plan needs to be updated       AM-PAC PT "6 Clicks" Mobility   Outcome Measure  Help needed turning from your back to your side while in a flat bed without using bedrails?: None Help needed moving from lying on your back to sitting on the side of a flat bed without using bedrails?: None Help needed moving to and from a bed to a chair (including a wheelchair)?: A Little Help needed standing up from a chair using your arms (e.g., wheelchair or bedside chair)?: A Little Help needed to walk in hospital room?: A Little Help needed climbing 3-5 steps with a railing? : A Little 6 Click Score: 20    End of Session Equipment Utilized During Treatment: Oxygen Activity Tolerance: Patient tolerated treatment well Patient left: in chair;with call bell/phone within reach Nurse Communication: Mobility status PT Visit Diagnosis: Other abnormalities of gait and mobility (R26.89)     Time: 4356-8616 PT Time Calculation (min) (ACUTE ONLY): 29 min  Charges:  $Gait Training: 8-22 mins $Therapeutic Activity: 8-22 mins                      Krystalle Pilkington B. Migdalia Dk PT, DPT Acute Rehabilitation Services Please use secure chat or  Call Office [phone]     [place] 05/28/2022, 1:43 PM

## 2022-06-02 ENCOUNTER — Telehealth: Payer: Self-pay | Admitting: Cardiovascular Disease

## 2022-06-02 ENCOUNTER — Encounter (HOSPITAL_COMMUNITY): Payer: Self-pay

## 2022-06-02 ENCOUNTER — Ambulatory Visit (HOSPITAL_BASED_OUTPATIENT_CLINIC_OR_DEPARTMENT_OTHER)
Admit: 2022-06-02 | Discharge: 2022-06-02 | Disposition: A | Discharge status: Home / Self Care | Payer: Medicare Other | Attending: Cardiology | Admitting: Cardiology

## 2022-06-02 ENCOUNTER — Other Ambulatory Visit: Payer: Self-pay

## 2022-06-02 VITALS — BP 130/70 | HR 64 | Wt 186.8 lb

## 2022-06-02 DIAGNOSIS — I083 Combined rheumatic disorders of mitral, aortic and tricuspid valves: Secondary | ICD-10-CM | POA: Insufficient documentation

## 2022-06-02 DIAGNOSIS — Z79899 Other long term (current) drug therapy: Secondary | ICD-10-CM | POA: Insufficient documentation

## 2022-06-02 DIAGNOSIS — E1122 Type 2 diabetes mellitus with diabetic chronic kidney disease: Secondary | ICD-10-CM | POA: Insufficient documentation

## 2022-06-02 DIAGNOSIS — I5032 Chronic diastolic (congestive) heart failure: Secondary | ICD-10-CM

## 2022-06-02 DIAGNOSIS — K922 Gastrointestinal hemorrhage, unspecified: Secondary | ICD-10-CM | POA: Diagnosis not present

## 2022-06-02 DIAGNOSIS — Z45010 Encounter for checking and testing of cardiac pacemaker pulse generator [battery]: Secondary | ICD-10-CM | POA: Insufficient documentation

## 2022-06-02 DIAGNOSIS — I5022 Chronic systolic (congestive) heart failure: Secondary | ICD-10-CM | POA: Diagnosis not present

## 2022-06-02 DIAGNOSIS — I130 Hypertensive heart and chronic kidney disease with heart failure and stage 1 through stage 4 chronic kidney disease, or unspecified chronic kidney disease: Secondary | ICD-10-CM | POA: Insufficient documentation

## 2022-06-02 DIAGNOSIS — I272 Pulmonary hypertension, unspecified: Secondary | ICD-10-CM | POA: Insufficient documentation

## 2022-06-02 DIAGNOSIS — K31811 Angiodysplasia of stomach and duodenum with bleeding: Secondary | ICD-10-CM | POA: Diagnosis not present

## 2022-06-02 LAB — COMPREHENSIVE METABOLIC PANEL
ALT: 9 U/L (ref 0–44)
AST: 15 U/L (ref 15–41)
Albumin: 3.1 g/dL — ABNORMAL LOW (ref 3.5–5.0)
Alkaline Phosphatase: 73 U/L (ref 38–126)
Anion gap: 13 (ref 5–15)
BUN: 115 mg/dL — ABNORMAL HIGH (ref 8–23)
CO2: 33 mmol/L — ABNORMAL HIGH (ref 22–32)
Calcium: 8.9 mg/dL (ref 8.9–10.3)
Chloride: 90 mmol/L — ABNORMAL LOW (ref 98–111)
Creatinine, Ser: 3.32 mg/dL — ABNORMAL HIGH (ref 0.44–1.00)
GFR, Estimated: 13 mL/min — ABNORMAL LOW (ref >60)
Glucose, Bld: 165 mg/dL — ABNORMAL HIGH (ref 70–99)
Potassium: 3.5 mmol/L (ref 3.5–5.1)
Sodium: 136 mmol/L (ref 135–145)
Total Bilirubin: 1.3 mg/dL — ABNORMAL HIGH (ref 0.3–1.2)
Total Protein: 5.9 g/dL — ABNORMAL LOW (ref 6.5–8.1)

## 2022-06-02 LAB — BRAIN NATRIURETIC PEPTIDE: B Natriuretic Peptide: 2414.4 pg/mL — ABNORMAL HIGH (ref 0.0–100.0)

## 2022-06-02 NOTE — Telephone Encounter (Signed)
Heart Failure Nurse Navigator Progress Note   Mailbox full, unable to leave appointment reminder message for 12 noon on 06/02/22.  Earnestine Leys, BSN, Clinical cytogeneticist Only

## 2022-06-02 NOTE — Progress Notes (Signed)
HEART & VASCULAR TRANSITION OF CARE CONSULT NOTE     Referring Physician: Dr. Beckie Busing  Primary Care: Carol Ada, MD Primary Cardiologist: Evalina Field, MD EP: Dr. Lovena Le   HPI: Referred to clinic by Dr. Harl Bowie for heart failure consultation.   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   Echo 2/22 LVEF normal 50-55%, LV mod dilated, LA severely dilated, mod-severe MR w/ mod-severely elevated RVSP 62 mmHg, RV normal. No TR.   Echo 9/23 EF down, 25-30%, RV mod reduced. Mod-severe MR. 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   Recent admission 11/23 for a/c CHF. Limited Echo EF 30-35%, mild LVH, moderately reduced RV systolic function and severely elevated pulmonary artery systolic pressure, moderate-severe MR, severe TR. Note, patient's device interrogation showed high RV pacing, about 88% of the time. Also noted to have had frequent episodes of ectopic atrial tachycardia on device interrogation from 03/2022 that required up-titration of coreg. It was suspect that reduced EF was due to high pacing load and dyssynchrony and episodes of tachycardia. She had seen EP on 10/26 and upgrade to BiV PPM was recommended however pt declined. It was also mentioned in notes that pt had also previously been offered referral to the [facility] but pt also declined.   She was diuresed 7L w/ IV Lasix. GDMT limited by a/c stage IV CKD. Creatinine trend 2.4->2.5->2.6->2.7->2.98->3.04->3.35 (bsl 1.8-2.0). GFR too low for SGLT2i. Switched back to PO torsemide 40 mg daily w/ addition of metolazone 5 mg q Monday.  Discharged 12/7. Referred to [facility] clinic. Discharge wt 191 lb.   Presents today for follow-up. Here w/ sister. Breathing overall improved, uses [place] PRN 2L/min which is baseline. Wt has been trending down since admission.  Wt today 186 lb. She reports symptoms of fatigue, GI upset and early satiety. Has trace-1+ b/l LE on exam. Reports full  compliance w/ home meds including diuretics. Reports UOP is not robust w/ 40 of torsemide. Has f/u w/ nephrology next month.    ReDs 32%.   We discussed likely etiology for her new HF and discussed device upgrade to CRT-P. She would like to discuss further w/ EP.    Cardiac Testing   2D Echo 11/23 left ventricular ejection fraction, by estimation, is 30 to 35%. The left ventricle demonstrates global hypokinesis. There is mild concentric left ventricular hypertrophy. Left ventricular diastolic function could not be evaluated. 1. Right ventricular systolic function is moderately reduced. The right ventricular size is moderately enlarged. There is severely elevated pulmonary artery systolic pressure. 2. The mitral valve is normal in structure. Moderate to severe mitral valve regurgitation. No evidence of mitral stenosis. Moderate mitral annular calcification. 3. 4. Tricuspid valve regurgitation is severe. The aortic valve is normal in structure. There is mild calcification of the aortic valve. Aortic valve regurgitation is not visualized. Aortic valve sclerosis is present, with no evidence of aortic valve stenosis. 5. The inferior vena cava is normal in size with greater than 50% respiratory variability, suggesting right atrial pressure of 3 mmHg.   Review of Systems: [y] = yes, _0  = no   General: Weight gain _1 ; Weight loss [ Y]; Anorexia [ Y]; Fatigue [ Y]; Fever _2 ; Chills _3 ; Weakness _4   Cardiac: Chest pain/pressure _5 ; Resting SOB _6 ; Exertional SOB [Y ]; Orthopnea _7 ; Pedal Edema [Y ]; Palpitations _8 ; Syncope _9 ; Presyncope _10 ; Paroxysmal  nocturnal dyspnea_0   Pulmonary: Cough _1 ; Wheezing_2 ; Hemoptysis_3 ; Sputum _4 ; Snoring _5   GI: Vomiting_6 ; Dysphagia_7 ; Melena_8 ; Hematochezia _9 ; Heartburn_10 ; Abdominal pain [Y ]; Constipation _11 ; Diarrhea _12 ; BRBPR _13   GU: Hematuria_14 ; Dysuria _15 ; Nocturia_16   Vascular: Pain in legs with walking _17 ; Pain in feet  with lying flat _18 ; Non-healing sores _19 ; Stroke _20 ; TIA _21 ; Slurred speech _22 ;  Neuro: Headaches_23 ; Vertigo_24 ; Seizures_25 ; Paresthesias_26 ;Blurred vision _27 ; Diplopia _28 ; Vision changes _29   Ortho/Skin: Arthritis _30 ; Joint pain _31 ; Muscle pain _32 ; Joint swelling _33 ; Back Pain _34 ; Rash _35   Psych: Depression_36 ; Anxiety_37   Heme: Bleeding problems _38 ; Clotting disorders _39 ; Anemia _40   Endocrine: Diabetes _41 ; Thyroid dysfunction_42    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   Anxiety    Arthritis    Atrial fibrillation ([place])    Avascular necrosis ([place]) hip left   leg pain also   Chronic back pain    Chronic kidney disease (CKD), stage III (moderate) (HCC)    Depression    Diverticulosis    Early cataracts, bilateral    Esophageal stricture    Gastritis    GAVE (gastric antral vascular ectasia)    GERD (gastroesophageal reflux disease)    Glaucoma    Heart murmur     " some doctors say that I have one some say that I don"t "   Hiatal hernia    History of blood transfusion    "@ least w/1st knee OR"   History of gout    History of kidney stones    Hyperlipidemia    Hypertension    Intestinal obstruction ([place])    Kidney stones    Obesity    Osteoarthritis    Osteoporosis    Pericarditis 2016   Presence of permanent cardiac pacemaker    Sjogren's disease ([place])    Thyroid disease    Type II diabetes mellitus ([place])    Vitamin B12 deficiency     Current Outpatient Medications  Medication Sig Dispense Refill   ACCU-CHEK AVIVA PLUS test strip 1 each by Other route daily.      acetaminophen (TYLENOL) 650 MG CR tablet Take 1,300 mg by mouth every evening.     BD PEN NEEDLE NANO U/F 32G X 4 MM MISC See admin instructions.     carvedilol (COREG) 12.5 MG tablet Take 18.75 mg by mouth 2 (two) times daily with a meal.     Cholecalciferol (VITAMIN D3) 25 MCG (1000 UT) CAPS Take 1,000 Units by mouth daily.      cycloSPORINE (RESTASIS) 0.05 % ophthalmic emulsion Place 1 drop into both eyes 2 (two) times daily as needed (eye dryness).     dicyclomine (BENTYL) 10 MG capsule Take 1 capsule (10 mg total) by mouth every 8 (eight) hours as needed for spasms. 30 capsule 3   ELIQUIS 2.5 MG TABS tablet TAKE ONE TABLET TWICE DAILY 180 tablet 1   febuxostat (ULORIC) 40 MG tablet Take 1 tablet (40 mg total) by mouth every Monday, Wednesday, and Friday. 12 tablet 0   ferrous sulfate 325 (65 FE) MG EC tablet Take 325 mg by mouth daily with breakfast.     gabapentin (NEURONTIN) 400 MG capsule Take  400 mg by mouth 2 (two) times daily.     hydrALAZINE (APRESOLINE) 25 MG tablet Take 1 tablet (25 mg total) by mouth every 8 (eight) hours. 270 tablet 3   isosorbide mononitrate (IMDUR) 30 MG 24 hr tablet Take 1 tablet (30 mg total) by mouth daily. 90 tablet 3   levothyroxine (SYNTHROID) 75 MCG tablet Take 75 mcg by mouth at bedtime.     lidocaine (LIDODERM) 5 % Place 1-3 patches onto the skin See admin instructions. Apply 1-3 patches transdermally every 24 hours as needed for pain and remove & discard patches within 12 hours or as directed by MD     metolazone (ZAROXOLYN) 5 MG tablet Take 1 tablet (5 mg total) by mouth every Monday. 30 tablet 6   montelukast (SINGULAIR) 10 MG tablet Take 1 tablet (10 mg total) by mouth at bedtime. 30 tablet 0   nystatin (NYAMYC) powder APPLY TOPICALLY TO LEFT ABDOMINAL FOLD AND GROIN TWICE A DAY (Patient taking differently: Apply 1 Application topically daily as needed (Fungal infection). APPLY TOPICALLY TO LEFT ABDOMINAL FOLD AND GROIN) 15 g 0   ondansetron (ZOFRAN-ODT) 4 MG disintegrating tablet Take 1 tablet (4 mg total) by mouth every 8 (eight) hours as needed for nausea or vomiting. 30 tablet 1   pantoprazole (PROTONIX) 40 MG tablet Take 40 mg by mouth daily.     pentoxifylline (TRENTAL) 400 MG CR tablet Take 400 mg by mouth daily. With a meal     rOPINIRole (REQUIP) 0.5 MG tablet Take  0.5 mg by mouth at bedtime.     rosuvastatin (CRESTOR) 5 MG tablet Take 1 tablet (5 mg total) by mouth daily. 30 tablet 0   sennosides-docusate sodium (SENOKOT-S) 8.6-50 MG tablet Take 1 tablet by mouth daily.     SF 5000 PLUS 1.1 % CREA dental cream Take by mouth as directed.     SIMBRINZA 1-0.2 % SUSP Place 1 drop into the right eye 2 (two) times daily.     torsemide (DEMADEX) 20 MG tablet Take 2 tablets (40 mg total) by mouth daily. 90 tablet 3   traMADol (ULTRAM) 50 MG tablet Take 50 mg by mouth 2 (two) times daily.     TRESIBA FLEXTOUCH 100 UNIT/ML FlexTouch Pen Inject 8-10 Units into the skin every evening.     No current facility-administered medications for this encounter.    Allergies  Allergen Reactions   Norvasc [Amlodipine Besylate] Shortness Of Breath   Other Rash and Other (See Comments)    Pt voiced she is allergic to microfiber materials/ blankets/sheets!!   Amlodipine Besylate     Other reaction(s): SOB   Avapro [Irbesartan] Other (See Comments)    Headaches   Glucophage [Metformin Hydrochloride] Other (See Comments)    "increases creatinine" and diarrhea   Lisinopril     Headache    Losartan Other (See Comments)    Headaches    Lumigan [Bimatoprost] Other (See Comments)    Eye burning and irritation   Morphine And Related Other (See Comments)    "Hallucinations"   Simvastatin Other (See Comments)    Body aches   Trulicity [Dulaglutide] Other (See Comments)    Severe mood swings      Social History   Socioeconomic History   Marital status: Divorced    Spouse name: Not on file   Number of children: 2   Years of education: Not on file   Highest education level: High school graduate  Occupational History   Occupation: retired  Tobacco Use   Smoking status: Former    Packs/day: 2.00    Years: 3.00    Total pack years: 6.00    Types: Cigarettes    Quit date: 06/23/1983    Years since quitting: 38.9    Passive exposure: Never   Smokeless tobacco:  Never  Vaping Use   Vaping Use: Never used  Substance and Sexual Activity   Alcohol use: Yes    Alcohol/week: 2.0 standard drinks of alcohol    Types: 2 Glasses of wine per week    Comment: occ   Drug use: No   Sexual activity: Not Currently  Other Topics Concern   Not on file  Social History Narrative   Not on file   Social Determinants of Health   Financial Resource Strain: Low Risk  (05/22/2022)   Overall Financial Resource Strain (CARDIA)    Difficulty of Paying Living Expenses: Not hard at all  Food Insecurity: No Food Insecurity (05/21/2022)   Hunger Vital Sign    Worried About Running Out of Food in the Last Year: Never true    Ran Out of Food in the Last Year: Never true  Transportation Needs: No Transportation Needs (05/22/2022)   PRAPARE - Hydrologist (Medical): No    Lack of Transportation (Non-Medical): No  Physical Activity: Not on file  Stress: Not on file  Social Connections: Not on file  Intimate Partner Violence: Not At Risk (05/21/2022)   Humiliation, Afraid, Rape, and Kick questionnaire    Fear of Current or Ex-Partner: No    Emotionally Abused: No    Physically Abused: No    Sexually Abused: No      Family History  Problem Relation Age of Onset   Diabetes Mother    Hypertension Sister    Diabetes Brother    Colon cancer Neg Hx    Heart attack Neg Hx    Stroke Neg Hx    Colon polyps Neg Hx    Esophageal cancer Neg Hx    Rectal cancer Neg Hx    Stomach cancer Neg Hx     Vitals:   06/02/22 1154  BP: 130/70  Pulse: 64  SpO2: 98%  Weight: 84.7 kg (186 lb 12.8 oz)    PHYSICAL EXAM: ReDS 32%  General:  Elderly WF, in WC, kyphotic posture using 2L [place], normal WOB  HEENT: normal Neck: supple. no JVD. Carotids 2+ bilat; no bruits. No lymphadenopathy or thryomegaly appreciated. Cor: PMI nondisplaced. Regular rate & rhythm. 2/6 MR and TR murmurs Lungs: decreased BS at the bases bilaterally  Abdomen: obese, soft,  nontender, nondistended. No hepatosplenomegaly. No bruits or masses. Good bowel sounds. Extremities: no cyanosis, clubbing, rash, trace b/l pretibial edema Neuro: alert & oriented x 3, cranial nerves grossly intact. moves all 4 extremities w/o difficulty. Affect pleasant.  ECG:AV dual paced rhythm, 61 bpm ARS duration 232 ms    ASSESSMENT & PLAN:  1. 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 Dyssynchrony noted on echo. QRS duration 232 ms on EKG today - refer back to EP for discussion regarding CRT-P upgrade  - not candidate for other advanced HF therapies given age and CKD IV - NYHA Class II-early III,  confounded by advanced age and deconditioning  - Wt is down since d/c, ReDs only 32%. She has LEE on exam, GI symptoms and poor response to torsemide. Suspect R>L sided HF. Will check CMP and BNP.  -Continue Torsemide 40 daily + once weekly metolazone. F/u w/ [facility] -GDMT limited by CKD IV -Continue Hydralazine 25 mg tid -Continue Imdur 30 mg daily  -Continue Coreg 18.75 mg bid   2. CHB s/p PPM - high percentage RV pacing, ~88% on recent device interrogation - suspect contributing to worsening HF. Dyssynchrony noted on echo. QRS 232 ms on EKG today  - we discussed CRT-P upgrade and she is interested in this. Will refer back to EP to discuss    3. Mitral Regurgitation/ Tricuspid Regurgitation  - mod-severe MR, severe TR on recent echo  - functional in setting or LA/RA, LV/RV dilation  - HF optimization w/ diuretics, GDMT and device upgrade   4. CKD Stage IV  - recent progression of CKD in setting of worsening HF  - hospital trend, 2.4->2.5->2.6->2.7->2.98->3.04->3.35 (bsl 1.8-2.0) - suspect cardiorenal  - check CMP today  - followed by [facility]  (Dr. Moshe Cipro) - doubt she would be a good candidate for HD given age and RV failure   NYHA II-early III GDMT  Diuretic- torsemide 40 mg daily + Metolazone 5 mg daily  BB- Coreg 18.75 mg bid  Ace/ARB/ARNI no CKD IV  MRA no CKD IV  SGLT2i no CKD IV GFR 13    Referred to HFSW (PCP, Medications, Transportation, ETOH Abuse, Drug Abuse, Insurance, Financial ): No  Refer to Pharmacy: No  Refer to Home Health: No  Refer to Advanced Heart Failure Clinic: No  Refer to General Cardiology: Yes (Followed by Dr. Audie Box and Dr. Lovena Le)   Follow up w/ gen cards as planned. Has f/u next wk

## 2022-06-02 NOTE — Telephone Encounter (Signed)
Tricia Gonzales would like to continue PT: 3 x's this week And then 2 x weekly for 3 weeks to follow. They state it is for strength and gate training.

## 2022-06-02 NOTE — Progress Notes (Signed)
ReDS Vest / Clip - 06/02/22 1200       ReDS Vest / Clip   Station Marker B    Ruler Value 34    ReDS Value Range Low volume    ReDS Actual Value 32

## 2022-06-02 NOTE — Telephone Encounter (Signed)
Called Greg back, LVM advising okay for PT- and to send over order. Gave fax number and call back number.

## 2022-06-02 NOTE — Patient Instructions (Signed)
There has been no changes to your medications.  Labs done today, your results will be available in MyChart, we will contact you for abnormal readings.  Thank you for allowing [place] to provider your heart failure care after your recent hospitalization. Please follow-up 3-4 weeks  If you have any questions, issues, or concerns before your next appointment please call our office at [phone], opt. [phone] and leave a message for the triage nurse.

## 2022-06-04 ENCOUNTER — Ambulatory Visit: Payer: Medicare Other | Admitting: Physician Assistant

## 2022-06-04 ENCOUNTER — Encounter: Payer: Medicare Other | Admitting: Internal Medicine

## 2022-06-05 ENCOUNTER — Other Ambulatory Visit: Payer: Self-pay

## 2022-06-05 ENCOUNTER — Encounter (HOSPITAL_COMMUNITY): Payer: Self-pay | Admitting: Internal Medicine

## 2022-06-05 ENCOUNTER — Emergency Department (HOSPITAL_COMMUNITY): Payer: Medicare Other

## 2022-06-05 ENCOUNTER — Inpatient Hospital Stay (HOSPITAL_COMMUNITY)
Admission: EM | Admit: 2022-06-05 | Discharge: 2022-06-19 | DRG: 377 | Disposition: A | Discharge status: Skilled Nursing Facility | Payer: Medicare Other | Attending: Family Medicine | Admitting: Family Medicine

## 2022-06-05 DIAGNOSIS — Z833 Family history of diabetes mellitus: Secondary | ICD-10-CM

## 2022-06-05 DIAGNOSIS — F419 Anxiety disorder, unspecified: Secondary | ICD-10-CM | POA: Diagnosis present

## 2022-06-05 DIAGNOSIS — Z9981 Dependence on supplemental oxygen: Secondary | ICD-10-CM

## 2022-06-05 DIAGNOSIS — Z66 Do not resuscitate: Secondary | ICD-10-CM | POA: Diagnosis present

## 2022-06-05 DIAGNOSIS — I130 Hypertensive heart and chronic kidney disease with heart failure and stage 1 through stage 4 chronic kidney disease, or unspecified chronic kidney disease: Secondary | ICD-10-CM | POA: Diagnosis not present

## 2022-06-05 DIAGNOSIS — Z8249 Family history of ischemic heart disease and other diseases of the circulatory system: Secondary | ICD-10-CM

## 2022-06-05 DIAGNOSIS — K5900 Constipation, unspecified: Secondary | ICD-10-CM | POA: Diagnosis not present

## 2022-06-05 DIAGNOSIS — I4510 Unspecified right bundle-branch block: Secondary | ICD-10-CM | POA: Diagnosis present

## 2022-06-05 DIAGNOSIS — N184 Chronic kidney disease, stage 4 (severe): Secondary | ICD-10-CM | POA: Diagnosis present

## 2022-06-05 DIAGNOSIS — J9611 Chronic respiratory failure with hypoxia: Secondary | ICD-10-CM | POA: Diagnosis present

## 2022-06-05 DIAGNOSIS — Z950 Presence of cardiac pacemaker: Secondary | ICD-10-CM

## 2022-06-05 DIAGNOSIS — T45515A Adverse effect of anticoagulants, initial encounter: Secondary | ICD-10-CM | POA: Diagnosis present

## 2022-06-05 DIAGNOSIS — E782 Mixed hyperlipidemia: Secondary | ICD-10-CM

## 2022-06-05 DIAGNOSIS — D62 Acute posthemorrhagic anemia: Secondary | ICD-10-CM | POA: Diagnosis not present

## 2022-06-05 DIAGNOSIS — E039 Hypothyroidism, unspecified: Secondary | ICD-10-CM | POA: Diagnosis present

## 2022-06-05 DIAGNOSIS — I495 Sick sinus syndrome: Secondary | ICD-10-CM | POA: Diagnosis present

## 2022-06-05 DIAGNOSIS — M109 Gout, unspecified: Secondary | ICD-10-CM | POA: Diagnosis present

## 2022-06-05 DIAGNOSIS — I480 Paroxysmal atrial fibrillation: Secondary | ICD-10-CM | POA: Diagnosis present

## 2022-06-05 DIAGNOSIS — I442 Atrioventricular block, complete: Secondary | ICD-10-CM | POA: Diagnosis present

## 2022-06-05 DIAGNOSIS — I4719 Other supraventricular tachycardia: Secondary | ICD-10-CM | POA: Diagnosis present

## 2022-06-05 DIAGNOSIS — R339 Retention of urine, unspecified: Secondary | ICD-10-CM | POA: Diagnosis present

## 2022-06-05 DIAGNOSIS — I340 Nonrheumatic mitral (valve) insufficiency: Secondary | ICD-10-CM | POA: Diagnosis not present

## 2022-06-05 DIAGNOSIS — I428 Other cardiomyopathies: Secondary | ICD-10-CM | POA: Diagnosis present

## 2022-06-05 DIAGNOSIS — E1122 Type 2 diabetes mellitus with diabetic chronic kidney disease: Secondary | ICD-10-CM | POA: Diagnosis present

## 2022-06-05 DIAGNOSIS — Z6841 Body Mass Index (BMI) 40.0 and over, adult: Secondary | ICD-10-CM

## 2022-06-05 DIAGNOSIS — J189 Pneumonia, unspecified organism: Secondary | ICD-10-CM | POA: Diagnosis not present

## 2022-06-05 DIAGNOSIS — G8929 Other chronic pain: Secondary | ICD-10-CM | POA: Diagnosis present

## 2022-06-05 DIAGNOSIS — Z7985 Long-term (current) use of injectable non-insulin antidiabetic drugs: Secondary | ICD-10-CM

## 2022-06-05 DIAGNOSIS — E1136 Type 2 diabetes mellitus with diabetic cataract: Secondary | ICD-10-CM | POA: Diagnosis present

## 2022-06-05 DIAGNOSIS — D6832 Hemorrhagic disorder due to extrinsic circulating anticoagulants: Secondary | ICD-10-CM | POA: Diagnosis present

## 2022-06-05 DIAGNOSIS — N170 Acute kidney failure with tubular necrosis: Secondary | ICD-10-CM | POA: Diagnosis not present

## 2022-06-05 DIAGNOSIS — D509 Iron deficiency anemia, unspecified: Secondary | ICD-10-CM | POA: Diagnosis present

## 2022-06-05 DIAGNOSIS — F32A Depression, unspecified: Secondary | ICD-10-CM | POA: Diagnosis present

## 2022-06-05 DIAGNOSIS — K5790 Diverticulosis of intestine, part unspecified, without perforation or abscess without bleeding: Secondary | ICD-10-CM | POA: Diagnosis present

## 2022-06-05 DIAGNOSIS — E876 Hypokalemia: Secondary | ICD-10-CM | POA: Diagnosis not present

## 2022-06-05 DIAGNOSIS — Z794 Long term (current) use of insulin: Secondary | ICD-10-CM | POA: Diagnosis not present

## 2022-06-05 DIAGNOSIS — Z515 Encounter for palliative care: Secondary | ICD-10-CM | POA: Diagnosis not present

## 2022-06-05 DIAGNOSIS — I2722 Pulmonary hypertension due to left heart disease: Secondary | ICD-10-CM | POA: Diagnosis present

## 2022-06-05 DIAGNOSIS — Z888 Allergy status to other drugs, medicaments and biological substances status: Secondary | ICD-10-CM

## 2022-06-05 DIAGNOSIS — Z751 Person awaiting admission to adequate facility elsewhere: Secondary | ICD-10-CM

## 2022-06-05 DIAGNOSIS — I5022 Chronic systolic (congestive) heart failure: Secondary | ICD-10-CM | POA: Diagnosis not present

## 2022-06-05 DIAGNOSIS — K921 Melena: Secondary | ICD-10-CM | POA: Diagnosis present

## 2022-06-05 DIAGNOSIS — I5082 Biventricular heart failure: Secondary | ICD-10-CM | POA: Diagnosis present

## 2022-06-05 DIAGNOSIS — U071 COVID-19: Secondary | ICD-10-CM | POA: Diagnosis present

## 2022-06-05 DIAGNOSIS — Z79890 Hormone replacement therapy (postmenopausal): Secondary | ICD-10-CM

## 2022-06-05 DIAGNOSIS — I5043 Acute on chronic combined systolic (congestive) and diastolic (congestive) heart failure: Secondary | ICD-10-CM | POA: Diagnosis not present

## 2022-06-05 DIAGNOSIS — Z87442 Personal history of urinary calculi: Secondary | ICD-10-CM

## 2022-06-05 DIAGNOSIS — K625 Hemorrhage of anus and rectum: Principal | ICD-10-CM

## 2022-06-05 DIAGNOSIS — M549 Dorsalgia, unspecified: Secondary | ICD-10-CM | POA: Diagnosis present

## 2022-06-05 DIAGNOSIS — J1282 Pneumonia due to coronavirus disease 2019: Secondary | ICD-10-CM | POA: Diagnosis present

## 2022-06-05 DIAGNOSIS — K222 Esophageal obstruction: Secondary | ICD-10-CM | POA: Diagnosis present

## 2022-06-05 DIAGNOSIS — K2971 Gastritis, unspecified, with bleeding: Secondary | ICD-10-CM | POA: Diagnosis present

## 2022-06-05 DIAGNOSIS — N179 Acute kidney failure, unspecified: Secondary | ICD-10-CM | POA: Diagnosis present

## 2022-06-05 DIAGNOSIS — D500 Iron deficiency anemia secondary to blood loss (chronic): Secondary | ICD-10-CM | POA: Diagnosis not present

## 2022-06-05 DIAGNOSIS — Z7901 Long term (current) use of anticoagulants: Secondary | ICD-10-CM

## 2022-06-05 DIAGNOSIS — Z885 Allergy status to narcotic agent status: Secondary | ICD-10-CM

## 2022-06-05 DIAGNOSIS — M3500 Sicca syndrome, unspecified: Secondary | ICD-10-CM | POA: Diagnosis present

## 2022-06-05 DIAGNOSIS — J159 Unspecified bacterial pneumonia: Secondary | ICD-10-CM | POA: Diagnosis not present

## 2022-06-05 DIAGNOSIS — K31811 Angiodysplasia of stomach and duodenum with bleeding: Principal | ICD-10-CM | POA: Diagnosis present

## 2022-06-05 DIAGNOSIS — Z96653 Presence of artificial knee joint, bilateral: Secondary | ICD-10-CM | POA: Diagnosis present

## 2022-06-05 DIAGNOSIS — K2289 Other specified disease of esophagus: Secondary | ICD-10-CM | POA: Diagnosis present

## 2022-06-05 DIAGNOSIS — M7989 Other specified soft tissue disorders: Secondary | ICD-10-CM | POA: Diagnosis not present

## 2022-06-05 DIAGNOSIS — E875 Hyperkalemia: Secondary | ICD-10-CM | POA: Diagnosis not present

## 2022-06-05 DIAGNOSIS — E119 Type 2 diabetes mellitus without complications: Secondary | ICD-10-CM

## 2022-06-05 DIAGNOSIS — K922 Gastrointestinal hemorrhage, unspecified: Secondary | ICD-10-CM | POA: Diagnosis present

## 2022-06-05 DIAGNOSIS — N823 Fistula of vagina to large intestine: Secondary | ICD-10-CM | POA: Diagnosis present

## 2022-06-05 DIAGNOSIS — D631 Anemia in chronic kidney disease: Secondary | ICD-10-CM | POA: Diagnosis present

## 2022-06-05 DIAGNOSIS — E869 Volume depletion, unspecified: Secondary | ICD-10-CM | POA: Diagnosis present

## 2022-06-05 DIAGNOSIS — N189 Chronic kidney disease, unspecified: Secondary | ICD-10-CM | POA: Diagnosis not present

## 2022-06-05 DIAGNOSIS — M810 Age-related osteoporosis without current pathological fracture: Secondary | ICD-10-CM | POA: Diagnosis present

## 2022-06-05 DIAGNOSIS — R338 Other retention of urine: Secondary | ICD-10-CM | POA: Diagnosis not present

## 2022-06-05 DIAGNOSIS — M19032 Primary osteoarthritis, left wrist: Secondary | ICD-10-CM | POA: Diagnosis present

## 2022-06-05 DIAGNOSIS — E785 Hyperlipidemia, unspecified: Secondary | ICD-10-CM | POA: Diagnosis present

## 2022-06-05 DIAGNOSIS — Z79899 Other long term (current) drug therapy: Secondary | ICD-10-CM

## 2022-06-05 DIAGNOSIS — I509 Heart failure, unspecified: Secondary | ICD-10-CM | POA: Diagnosis not present

## 2022-06-05 DIAGNOSIS — K449 Diaphragmatic hernia without obstruction or gangrene: Secondary | ICD-10-CM | POA: Diagnosis present

## 2022-06-05 DIAGNOSIS — N210 Calculus in bladder: Secondary | ICD-10-CM | POA: Diagnosis present

## 2022-06-05 DIAGNOSIS — K219 Gastro-esophageal reflux disease without esophagitis: Secondary | ICD-10-CM | POA: Diagnosis present

## 2022-06-05 DIAGNOSIS — K224 Dyskinesia of esophagus: Secondary | ICD-10-CM | POA: Diagnosis present

## 2022-06-05 DIAGNOSIS — Z87891 Personal history of nicotine dependence: Secondary | ICD-10-CM

## 2022-06-05 DIAGNOSIS — K3189 Other diseases of stomach and duodenum: Secondary | ICD-10-CM | POA: Diagnosis present

## 2022-06-05 LAB — COMPREHENSIVE METABOLIC PANEL
ALT: 12 U/L (ref 0–44)
AST: 24 U/L (ref 15–41)
Albumin: 2.6 g/dL — ABNORMAL LOW (ref 3.5–5.0)
Alkaline Phosphatase: 66 U/L (ref 38–126)
Anion gap: 15 (ref 5–15)
BUN: 161 mg/dL — ABNORMAL HIGH (ref 8–23)
CO2: 32 mmol/L (ref 22–32)
Calcium: 8.5 mg/dL — ABNORMAL LOW (ref 8.9–10.3)
Chloride: 90 mmol/L — ABNORMAL LOW (ref 98–111)
Creatinine, Ser: 4.1 mg/dL — ABNORMAL HIGH (ref 0.44–1.00)
GFR, Estimated: 10 mL/min — ABNORMAL LOW (ref >60)
Glucose, Bld: 160 mg/dL — ABNORMAL HIGH (ref 70–99)
Potassium: 3.1 mmol/L — ABNORMAL LOW (ref 3.5–5.1)
Sodium: 137 mmol/L (ref 135–145)
Total Bilirubin: 1.3 mg/dL — ABNORMAL HIGH (ref 0.3–1.2)
Total Protein: 5.5 g/dL — ABNORMAL LOW (ref 6.5–8.1)

## 2022-06-05 LAB — CBC
HCT: 21.8 % — ABNORMAL LOW (ref 36.0–46.0)
Hemoglobin: 6.9 g/dL — CL (ref 12.0–15.0)
MCH: 31.9 pg (ref 26.0–34.0)
MCHC: 31.7 g/dL (ref 30.0–36.0)
MCV: 100.9 fL — ABNORMAL HIGH (ref 80.0–100.0)
Platelets: 209 10*3/uL (ref 150–400)
RBC: 2.16 MIL/uL — ABNORMAL LOW (ref 3.87–5.11)
RDW: 16.8 % — ABNORMAL HIGH (ref 11.5–15.5)
WBC: 8.7 10*3/uL (ref 4.0–10.5)
nRBC: 0 % (ref 0.0–0.2)

## 2022-06-05 LAB — RESP PANEL BY RT-PCR (RSV, FLU A&B, COVID)  RVPGX2
Influenza A by PCR: NEGATIVE
Influenza B by PCR: NEGATIVE
Resp Syncytial Virus by PCR: NEGATIVE
SARS Coronavirus 2 by RT PCR: POSITIVE — AB

## 2022-06-05 LAB — PROTIME-INR
INR: 2.1 — ABNORMAL HIGH (ref 0.8–1.2)
Prothrombin Time: 22.9 seconds — ABNORMAL HIGH (ref 11.4–15.2)

## 2022-06-05 LAB — CBG MONITORING, ED: Glucose-Capillary: 154 mg/dL — ABNORMAL HIGH (ref 70–99)

## 2022-06-05 LAB — PREPARE RBC (CROSSMATCH)

## 2022-06-05 LAB — POC OCCULT BLOOD, ED: Fecal Occult Bld: POSITIVE — AB

## 2022-06-05 MED ORDER — SODIUM CHLORIDE 0.9% IV SOLUTION: Freq: Once | INTRAVENOUS | Status: AC

## 2022-06-05 MED ORDER — SODIUM CHLORIDE 0.9 % IV SOLN
1.0000 g | INTRAVENOUS | Status: DC
  Administered 2022-06-06 – 2022-06-08 (×3): 1 g via INTRAVENOUS
  Filled 2022-06-05 (×3): qty 10

## 2022-06-05 MED ORDER — ACETAMINOPHEN 325 MG PO TABS
650.0000 mg | ORAL_TABLET | Freq: Four times a day (QID) | ORAL | Status: DC | PRN
  Administered 2022-06-06 – 2022-06-14 (×2): 650 mg via ORAL
  Filled 2022-06-05 (×3): qty 2

## 2022-06-05 MED ORDER — PANTOPRAZOLE SODIUM 40 MG IV SOLR: 40.0000 mg | Freq: Two times a day (BID) | INTRAVENOUS | Status: DC

## 2022-06-05 MED ORDER — FENTANYL CITRATE PF 50 MCG/ML IJ SOSY
25.0000 ug | PREFILLED_SYRINGE | INTRAMUSCULAR | Status: DC | PRN
  Administered 2022-06-06 – 2022-06-13 (×9): 25 ug via INTRAVENOUS
  Filled 2022-06-05 (×9): qty 1

## 2022-06-05 MED ORDER — INSULIN ASPART 100 UNIT/ML IJ SOLN
0.0000 [IU] | Freq: Four times a day (QID) | INTRAMUSCULAR | Status: DC
  Administered 2022-06-06 (×2): 1 [IU] via SUBCUTANEOUS
  Administered 2022-06-06 (×2): 2 [IU] via SUBCUTANEOUS
  Administered 2022-06-07 (×2): 3 [IU] via SUBCUTANEOUS
  Administered 2022-06-07: 2 [IU] via SUBCUTANEOUS
  Administered 2022-06-08: 3 [IU] via SUBCUTANEOUS
  Administered 2022-06-08 – 2022-06-09 (×3): 2 [IU] via SUBCUTANEOUS
  Administered 2022-06-09 – 2022-06-10 (×3): 3 [IU] via SUBCUTANEOUS
  Administered 2022-06-10: 2 [IU] via SUBCUTANEOUS
  Administered 2022-06-10 – 2022-06-12 (×6): 3 [IU] via SUBCUTANEOUS
  Administered 2022-06-12 (×2): 2 [IU] via SUBCUTANEOUS
  Administered 2022-06-13: 5 [IU] via SUBCUTANEOUS
  Administered 2022-06-13: 3 [IU] via SUBCUTANEOUS
  Administered 2022-06-13: 2 [IU] via SUBCUTANEOUS
  Administered 2022-06-13: 7 [IU] via SUBCUTANEOUS
  Administered 2022-06-13: 3 [IU] via SUBCUTANEOUS
  Administered 2022-06-14: 9 [IU] via SUBCUTANEOUS
  Administered 2022-06-14: 3 [IU] via SUBCUTANEOUS
  Administered 2022-06-14: 1 [IU] via SUBCUTANEOUS
  Administered 2022-06-15: 9 [IU] via SUBCUTANEOUS

## 2022-06-05 MED ORDER — PANTOPRAZOLE INFUSION (NEW) - SIMPLE MED
8.0000 mg/h | INTRAVENOUS | Status: DC
  Administered 2022-06-05 – 2022-06-08 (×8): 8 mg/h via INTRAVENOUS
  Filled 2022-06-05 (×8): qty 100

## 2022-06-05 MED ORDER — TRAMADOL HCL 50 MG PO TABS
50.0000 mg | ORAL_TABLET | Freq: Four times a day (QID) | ORAL | Status: DC | PRN
  Administered 2022-06-05: 50 mg via ORAL
  Filled 2022-06-05: qty 1

## 2022-06-05 MED ORDER — LACTATED RINGERS IV BOLUS
250.0000 mL | Freq: Once | INTRAVENOUS | Status: AC
  Administered 2022-06-06: 250 mL via INTRAVENOUS

## 2022-06-05 MED ORDER — ONDANSETRON HCL 4 MG/2ML IJ SOLN
4.0000 mg | Freq: Four times a day (QID) | INTRAMUSCULAR | Status: DC | PRN
  Administered 2022-06-06 – 2022-06-17 (×12): 4 mg via INTRAVENOUS
  Filled 2022-06-05 (×13): qty 2

## 2022-06-05 MED ORDER — POTASSIUM CHLORIDE 20 MEQ PO PACK
20.0000 meq | PACK | Freq: Once | ORAL | Status: AC
  Administered 2022-06-05: 20 meq via ORAL
  Filled 2022-06-05: qty 1

## 2022-06-05 MED ORDER — ACETAMINOPHEN 650 MG RE SUPP: 650.0000 mg | Freq: Four times a day (QID) | RECTAL | Status: DC | PRN

## 2022-06-05 MED ORDER — SODIUM CHLORIDE 0.9 % IV SOLN
500.0000 mg | Freq: Once | INTRAVENOUS | Status: AC
  Administered 2022-06-05: 500 mg via INTRAVENOUS
  Filled 2022-06-05: qty 5

## 2022-06-05 MED ORDER — SODIUM CHLORIDE 0.9 % IV SOLN
1.0000 g | Freq: Once | INTRAVENOUS | Status: AC
  Administered 2022-06-05: 1 g via INTRAVENOUS
  Filled 2022-06-05: qty 10

## 2022-06-05 MED ORDER — SODIUM CHLORIDE 0.9 % IV SOLN
500.0000 mg | INTRAVENOUS | Status: DC
  Administered 2022-06-06 – 2022-06-08 (×3): 500 mg via INTRAVENOUS
  Filled 2022-06-05 (×5): qty 5

## 2022-06-05 MED ORDER — NALOXONE HCL 0.4 MG/ML IJ SOLN: 0.4000 mg | INTRAMUSCULAR | Status: DC | PRN

## 2022-06-05 MED ORDER — FUROSEMIDE 10 MG/ML IJ SOLN
40.0000 mg | Freq: Once | INTRAMUSCULAR | Status: AC
  Administered 2022-06-06: 40 mg via INTRAVENOUS
  Filled 2022-06-05: qty 4

## 2022-06-05 NOTE — ED Triage Notes (Signed)
Patient BIB EMS from home. Per daughter, pt has been having dark and tarry stools today. Pt takes eliquis. Patient d/c from hospital last week for a chf exacerbation. Increased lethargy since being home. Patient has stage 4 kidney failure.   EMS Vitals 130/70 HR 60-90 with pacemaker 96% on 2L [place]  CBG 192

## 2022-06-05 NOTE — ED Provider Notes (Signed)
[facility] EMERGENCY DEPARTMENT Provider Note   CSN: [id number] Arrival date & time: 06/05/22  1846     History {Add pertinent medical, surgical, social history, OB history to HPI:1} Chief Complaint  Patient presents with   Rectal Bleeding    Tricia Gonzales is a 81 y.o. female.  HPI 81 year old female history of congestive heart failure, A-fib, diabetes, with recent admission for heart failure presents today complaining 2 days of fever, congestion, cough, and today having a large amount of dark stool.  Patient is on Eliquis.  She feels generally weak.  She denies any current chest pain or worsening shortness of breath.  She is chronically on oxygen since her last hospitalization.  She reports some decreased urine output.     Home Medications Prior to Admission medications   Medication Sig Start Date End Date Taking? Authorizing Provider  ACCU-CHEK AVIVA PLUS test strip 1 each by Other route daily.  03/15/15   [provider]  acetaminophen (TYLENOL) 650 MG CR tablet Take 1,300 mg by mouth every evening.    [provider]  BD PEN NEEDLE NANO U/F 32G X 4 MM MISC See admin instructions. 12/25/19   [provider]  carvedilol (COREG) 12.5 MG tablet Take 18.75 mg by mouth 2 (two) times daily with a meal.    [provider]  Cholecalciferol (VITAMIN D3) 25 MCG (1000 UT) CAPS Take 1,000 Units by mouth daily.    [provider]  cycloSPORINE (RESTASIS) 0.05 % ophthalmic emulsion Place 1 drop into both eyes 2 (two) times daily as needed (eye dryness). 10/30/20   [provider]  dicyclomine (BENTYL) 10 MG capsule Take 1 capsule (10 mg total) by mouth every 8 (eight) hours as needed for spasms. 10/07/21   Armbruster, Carlota Raspberry, MD  ELIQUIS 2.5 MG TABS tablet TAKE ONE TABLET TWICE DAILY 02/09/22   O'Neal, Cassie Freer, MD  febuxostat (ULORIC) 40 MG tablet Take 1 tablet (40 mg total) by mouth every Monday, Wednesday, and Friday.  05/12/19   Granville Lewis C, PA-C  ferrous sulfate 325 (65 FE) MG EC tablet Take 325 mg by mouth daily with breakfast.    [provider]  gabapentin (NEURONTIN) 400 MG capsule Take 400 mg by mouth 2 (two) times daily.    [provider]  hydrALAZINE (APRESOLINE) 25 MG tablet Take 1 tablet (25 mg total) by mouth every 8 (eight) hours. 05/28/22   Duke, Tami Lin, PA  isosorbide mononitrate (IMDUR) 30 MG 24 hr tablet Take 1 tablet (30 mg total) by mouth daily. 05/29/22   [facility], Tami Lin, PA  levothyroxine (SYNTHROID) 75 MCG tablet Take 75 mcg by mouth at bedtime. 04/14/21   [provider]  lidocaine (LIDODERM) 5 % Place 1-3 patches onto the skin See admin instructions. Apply 1-3 patches transdermally every 24 hours as needed for pain and remove & discard patches within 12 hours or as directed by MD    [provider]  metolazone (ZAROXOLYN) 5 MG tablet Take 1 tablet (5 mg total) by mouth every Monday. 06/01/22   Duke, Tami Lin, PA  montelukast (SINGULAIR) 10 MG tablet Take 1 tablet (10 mg total) by mouth at bedtime. 05/12/19   Granville Lewis C, PA-C  nystatin ([place]) powder APPLY TOPICALLY TO LEFT ABDOMINAL FOLD AND GROIN TWICE A DAY Patient taking differently: Apply 1 Application topically daily as needed (Fungal infection). APPLY TOPICALLY TO LEFT ABDOMINAL FOLD AND GROIN 05/12/19   Wille Celeste, PA-C  ondansetron (ZOFRAN-ODT) 4 MG disintegrating tablet Take 1 tablet (4 mg total) by mouth every 8 (eight) hours as needed for nausea or vomiting. 11/11/21   Armbruster, Carlota Raspberry, MD  pantoprazole (PROTONIX) 40 MG tablet Take 40 mg by mouth daily.    [provider]  pentoxifylline (TRENTAL) 400 MG CR tablet Take 400 mg by mouth daily. With a meal    [provider]  rOPINIRole (REQUIP) 0.5 MG tablet Take 0.5 mg by mouth at bedtime. 02/23/20   [provider]  rosuvastatin (CRESTOR) 5 MG tablet Take 1 tablet (5 mg total) by mouth daily.  05/12/19   Granville Lewis C, PA-C  sennosides-docusate sodium (SENOKOT-S) 8.6-50 MG tablet Take 1 tablet by mouth daily.    [provider]  SF 5000 PLUS 1.1 % CREA dental cream Take by mouth as directed. 12/13/21   [provider]  SIMBRINZA 1-0.2 % SUSP Place 1 drop into the right eye 2 (two) times daily. 03/24/22   [provider]  torsemide (DEMADEX) 20 MG tablet Take 2 tablets (40 mg total) by mouth daily. 03/27/22   Almyra Deforest, PA  traMADol (ULTRAM) 50 MG tablet Take 50 mg by mouth 2 (two) times daily. 02/13/20   [provider]  TRESIBA FLEXTOUCH 100 UNIT/ML FlexTouch Pen Inject 8-10 Units into the skin every evening. 02/21/20   [provider]      Allergies    Norvasc [amlodipine besylate], Other, Amlodipine besylate, Avapro [irbesartan], Glucophage [metformin hydrochloride], Lisinopril, Losartan, Lumigan [bimatoprost], Morphine and related, Simvastatin, and Trulicity [dulaglutide]    Review of Systems   Review of Systems  Physical Exam Updated Vital Signs BP (!) 99/52   Pulse 83   Temp 97.6 F (36.4 C) (Oral)   Resp 17   Ht 1.473 m (_0 )   Wt 81.6 kg   SpO2 100%   BMI 37.62 kg/m  Physical Exam Vitals and nursing note reviewed.  HENT:     Head: Normocephalic.     Right Ear: External ear normal.     Left Ear: External ear normal.     Nose: Nose normal.     Mouth/Throat:     Pharynx: Oropharynx is clear.  Eyes:     Pupils: Pupils are equal, round, and reactive to light.  Cardiovascular:     Rate and Rhythm: Normal rate and regular rhythm.     Pulses: Normal pulses.     Heart sounds: Normal heart sounds.  Pulmonary:     Effort: Pulmonary effort is normal.     Breath sounds: Normal breath sounds.     Comments: Some basilar crackles which cleared with repeat deep breaths Abdominal:     General: Bowel sounds are normal.     Palpations: Abdomen is soft.     Comments: Mild tenderness to the left lower quadrant  Genitourinary:     Comments: Patient with dark stool that appears to be maroon around rectal area Musculoskeletal:        General: Normal range of motion.     Cervical back: Normal range of motion.  Skin:    General: Skin is warm.     Capillary Refill: Capillary refill takes less than 2 seconds.     Coloration: Skin is pale.  Neurological:     General: No focal deficit present.     Mental Status: She is oriented to person, place, and time.     Cranial Nerves: No cranial nerve deficit.  Psychiatric:  Mood and Affect: Mood normal.        Behavior: Behavior normal.     ED Results / Procedures / Treatments   Labs (all labs ordered are listed, but only abnormal results are displayed) Labs Reviewed  CBG MONITORING, ED - Abnormal; Notable for the following components:      Result Value   Glucose-Capillary 154 (*)    All other components within normal limits  RESP PANEL BY RT-PCR (RSV, FLU A&B, COVID)  RVPGX2  CBC  COMPREHENSIVE METABOLIC PANEL  PROTIME-INR  POC OCCULT BLOOD, ED  TYPE AND SCREEN    EKG None  Radiology No results found.  Procedures Procedures  {Document cardiac monitor, telemetry assessment procedure when appropriate:1}  Medications Ordered in ED Medications  lactated ringers bolus 250 mL (has no administration in time range)    ED Course/ Medical Decision Making/ A&P Clinical Course as of 06/05/22 2108  Fri Jun 05, 2022  2107 CBC reviewed and hemoglobin has decreased to 6.9 white blood cell count and platelets are normal [DR]  2107 POC occult blood, ED RN will collect(!) Stool aspirated and is Hemoccult positive [DR]  5003 Complete metabolic panel is reviewed significant for mild hypokalemia with potassium of 3.1 BUN has increased to 161 and creatinine 4.1 [DR]  2107 Chest x-Oliver Neuwirth reviewed and interpreted and is significant for retrocardiac consolidation which could represent pneumonia [DR]    Clinical Course User Index [DR] Pattricia Boss, MD                            Medical Decision Making Patient presents with cough fever, rectal bleeding and weakness. Patient with maroon stool hGB decreased to 6 from 9 and on eliquis AKI with bun increased to 161 and creatinine increased to 5, likely secondary to diuresis 1- rectal bleeding will transfuse 2 units Eliquis to be held 2- cap- cough and fever will tx with rocephin and zithromax 3- increased bun and creatinine-last ECHO EF 30 to 35% on 05/21/2022  Amount and/or Complexity of Data Reviewed External Data Reviewed:     Details: Principal Problem:   Acute systolic heart failure (HCC) Active Problems:   Diabetes mellitus type 2, controlled ([place])   Hyperlipidemia   Hypertension   Obesity   Chronic kidney disease (CKD), stage III (moderate) (HCC)   Atrial fibrillation (HCC)   Sinus node dysfunction (HCC)   Acute kidney injury superimposed on chronic kidney disease ([place])   Pulmonary hypertension, unspecified (HCC)   Mitral regurgitation   Tricuspid regurgitation   RVF (right ventricular failure) (HCC) Principal Problem:   Acute systolic heart failure (HCC) Active Problems:   Diabetes mellitus type 2, controlled ([place])   Hyperlipidemia   Hypertension   Obesity   Chronic kidney disease (CKD), stage III (moderate) (HCC)   Atrial fibrillation (HCC)   Sinus node dysfunction (HCC)   Acute kidney injury superimposed on chronic kidney disease ([place])   Pulmonary hypertension, unspecified (HCC)   Mitral regurgitation   Tricuspid regurgitation   RVF (right ventricular failure) (HCC) Principal Problem:   Acute systolic heart failure (HCC) Active Problems:   Diabetes mellitus type 2, controlled ([place])   Hyperlipidemia   Hypertension   Obesity   Chronic kidney disease (CKD), stage III (moderate) (HCC)   Atrial fibrillation (HCC)   Sinus node dysfunction (HCC)   Acute kidney injury superimposed on chronic kidney disease ([place])   Pulmonary hypertension, unspecified (HCC)   Mitral regurgitation  Tricuspid regurgitation   RVF (right ventricular failure) ([place]) Echo 05/11/22: 1. Left ventricular ejection fraction, by estimation, is 30 to 35%. The  left ventricle demonstrates global hypokinesis. There is mild concentric  left ventricular hypertrophy. Left ventricular diastolic function could  not be evaluated.   2. Right ventricular systolic function is moderately reduced. The right  ventricular size is moderately enlarged. There is severely elevated  pulmonary artery systolic pressure.   3. The mitral valve is normal in structure. Moderate to severe mitral  valve regurgitation. No evidence of mitral stenosis. Moderate mitral  annular calcification.   4. Tricuspid valve regurgitation is severe.   5. The aortic valve is normal in structure. There is mild calcification  of the aortic valve. Aortic valve regurgitation is not visualized. Aortic  valve sclerosis is present, with no evidence of aortic valve stenosis.   6. The inferior vena cava is normal in size with greater than 50%  respiratory variability, suggesting right atrial pressure of 3 mmHg.   Labs: ordered. Radiology: ordered. ECG/medicine tests: ordered and independent interpretation performed. Decision-making details documented in ED Course. Discussion of management or test interpretation with external provider(s): Discussed with Dr. Elisabeth Cara, on-call for cardiology  Risk Prescription drug management.     {Document critical care time when appropriate:1} {Document review of labs and clinical decision tools ie heart score, Chads2Vasc2 etc:1}  {Document your independent review of radiology images, and any outside records:1} {Document your discussion with family members, caretakers, and with consultants:1} {Document social determinants of health affecting pt's care:1} {Document your decision making why or why not admission, treatments were needed:1} Final Clinical Impression(s) / ED Diagnoses Final diagnoses:  None     Rx / DC Orders ED Discharge Orders     None

## 2022-06-05 NOTE — Progress Notes (Signed)
ACP Documentation:   Per my discussions tonight with the patient and her daughter, who is present at bedside, the patient has expressed her wishes to be DNR.  She would not want chest compressions, antiarrhythmics, electrical shocks, or intubation in the setting of cardiac arrest.     Babs Bertin, DO Hospitalist

## 2022-06-05 NOTE — Consult Note (Incomplete)
Cardiology Consultation   Patient ID: ERIE SICA MRN: [id number]; DOB: 1941/03/15  Admit date: 06/05/2022 Date of Consult: 06/05/2022  PCP:  Carol Ada, [facility] Cardiologist:  Evalina Field, MD  Electrophysiologist:  Cristopher Peru, MD  { Click here to update MD or APP on Care Team, Refresh:1}     Patient Profile:   Tricia Gonzales is a 81 y.o. 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 Jeanell Sparrow.  History of Present Illness:   The following history was obtained from the patient and family.  Ms. Cogliano was recently hospitalized at [facility] from 87/86-76/7 for acute systolic CHF.  During that hospitalization she was found to have a newly reduced EF of 30 to 35% from her prior EF of 50%.  Interrogation of her PPM revealed that she paces 80% of the time and so we will stop that her cardiomyopathy was pacemaker mediated.  She was aggressively diuresed with IV Lasix and metolazone for volume overload with some improvement of her symptoms.  Unfortunately her renal function worsened over the course of hospitalization which prohibited further advancement of her GDMT.  She was discharged on Coreg 18.75 mg twice daily, hydralazine 25 mg q8h, and Imdur she was discharged in stable condition.  She initially felt better upon leaving the hospital, but over the weekend she started feeling poorly.  She states that she developed malaise and SOB.  Over the past 3 days her symptoms worsened and she developed a cough and fever to 101 F yesterday.  Earlier today the patient started having black watery BMs which prompted her to come to the ED for evaluation.   Past Medical History:  Diagnosis Date  . Anemia   . 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 Anxiety   . Arthritis   . Atrial fibrillation  ([place])   . Avascular necrosis (HCC) hip left   leg pain also  . Chronic back pain   . Chronic kidney disease (CKD), stage III (moderate) (HCC)   . Depression   . Diverticulosis   . Early cataracts, bilateral   . Esophageal stricture   . Gastritis   . GAVE (gastric antral vascular ectasia)   . GERD (gastroesophageal reflux disease)   . Glaucoma   . Heart murmur     " some doctors say that I have one some say that I don"t "  . Hiatal hernia   . History of blood transfusion    "@ least w/1st knee OR"  . History of gout   . History of kidney stones   . Hyperlipidemia   . Hypertension   . Intestinal obstruction ([place])   . Kidney stones   . Obesity   . Osteoarthritis   . Osteoporosis   . Pericarditis 2016  . Presence of permanent cardiac pacemaker   . Sjogren's disease ([place])   . Thyroid disease   . Type II diabetes mellitus ([place])   . Vitamin B12 deficiency     Past Surgical History:  Procedure Laterality Date  . ABDOMINAL HYSTERECTOMY     complete  . CARDIOVERSION N/A 07/31/2020   Procedure: CARDIOVERSION;  Surgeon: Werner Lean, MD;  Location: [facility]OS[facility];  Service: Cardiovascular;  Laterality: N/A;  . CHOLECYSTECTOMY OPEN    .  COLECTOMY     for rectovaginal fistula  . COLONOSCOPY  01/07/2012   Procedure: COLONOSCOPY;  Surgeon: Lafayette Dragon, MD;  Location: [facility] ENDOSCOPY;  Service: Endoscopy;  Laterality: N/A;  . EP IMPLANTABLE DEVICE N/A 06/18/2015   Procedure: Pacemaker Implant;  Surgeon: Evans Lance, MD;  Location: [facility];  Service: Cardiovascular;  Laterality: N/A;  . ESOPHAGEAL DILATION  08/17/2020   Procedure: ESOPHAGEAL DILATION;  Surgeon: Lavena Bullion, DO;  Location: [facility] ENDOSCOPY;  Service: Gastroenterology;;  . ESOPHAGOGASTRODUODENOSCOPY N/A 11/24/2018   Procedure: ESOPHAGOGASTRODUODENOSCOPY (EGD);  Surgeon: Yetta Flock, MD;  Location: [facility];  Service: Gastroenterology;  Laterality: N/A;  . ESOPHAGOGASTRODUODENOSCOPY  (EGD) WITH ESOPHAGEAL DILATION    . ESOPHAGOGASTRODUODENOSCOPY (EGD) WITH PROPOFOL N/A 08/12/2015   Procedure: ESOPHAGOGASTRODUODENOSCOPY (EGD) WITH PROPOFOL;  Surgeon: Manus Gunning, MD;  Location: [facility];  Service: Gastroenterology;  Laterality: N/A;  . ESOPHAGOGASTRODUODENOSCOPY (EGD) WITH PROPOFOL N/A 08/17/2020   Procedure: ESOPHAGOGASTRODUODENOSCOPY (EGD) WITH PROPOFOL;  Surgeon: Lavena Bullion, DO;  Location: [facility];  Service: Gastroenterology;  Laterality: N/A;  . EYE SURGERY Bilateral    cataracts  . HOT HEMOSTASIS N/A 11/24/2018   Procedure: HOT HEMOSTASIS (ARGON PLASMA COAGULATION/BICAP);  Surgeon: Yetta Flock, MD;  Location: [facility];  Service: Gastroenterology;  Laterality: N/A;  . HOT HEMOSTASIS N/A 08/17/2020   Procedure: HOT HEMOSTASIS (ARGON PLASMA COAGULATION/BICAP);  Surgeon: Lavena Bullion, DO;  Location: [facility] ENDOSCOPY;  Service: Gastroenterology;  Laterality: N/A;  . I & D EXTREMITY Right 08/06/2016   Procedure: DEBRIDEMENT PIP RIGHT RING FINGER;  Surgeon: Daryll Brod, MD;  Location: [facility];  Service: Orthopedics;  Laterality: Right;  . INSERT / REPLACE / REMOVE PACEMAKER    . JOINT REPLACEMENT    . KNEE ARTHROSCOPY Right   . MASS EXCISION Right 08/06/2016   Procedure: EXCISION CYST;  Surgeon: Daryll Brod, MD;  Location: [facility];  Service: Orthopedics;  Laterality: Right;  . REVISION TOTAL KNEE ARTHROPLASTY Left   . TOTAL KNEE ARTHROPLASTY Bilateral      {Home Medications (Optional):21181}  Inpatient Medications: Scheduled Meds: . sodium chloride   Intravenous Once   Continuous Infusions: . azithromycin (ZITHROMAX) 500 mg in sodium chloride 0.9 % 250 mL IVPB    . cefTRIAXone (ROCEPHIN)  IV    . lactated ringers     PRN Meds:   Allergies:    Allergies  Allergen Reactions  . Norvasc [Amlodipine Besylate] Shortness Of Breath  . Other Rash and Other (See Comments)    Pt voiced she is allergic to microfiber materials/  blankets/sheets!!  . Amlodipine Besylate     Other reaction(s): SOB  . Avapro [Irbesartan] Other (See Comments)    Headaches  . Glucophage [Metformin Hydrochloride] Other (See Comments)    "increases creatinine" and diarrhea  . Lisinopril     Headache   . Losartan Other (See Comments)    Headaches   . Lumigan [Bimatoprost] Other (See Comments)    Eye burning and irritation  . Morphine And Related Other (See Comments)    "Hallucinations"  . Simvastatin Other (See Comments)    Body aches  . Trulicity [Dulaglutide] Other (See Comments)    Severe mood swings    Social History:   Social History   Socioeconomic History  . Marital status: Divorced    Spouse name: Not on file  . Number of children: 2  . Years of education: Not on file  . Highest education level: High school graduate  Occupational  History  . Occupation: retired  Tobacco Use  . Smoking status: Former    Packs/day: 2.00    Years: 3.00    Total pack years: 6.00    Types: Cigarettes    Quit date: 06/23/1983    Years since quitting: 38.9    Passive exposure: Never  . Smokeless tobacco: Never  Vaping Use  . Vaping Use: Never used  Substance and Sexual Activity  . Alcohol use: Yes    Alcohol/week: 2.0 standard drinks of alcohol    Types: 2 Glasses of wine per week    Comment: occ  . Drug use: No  . Sexual activity: Not Currently  Other Topics Concern  . Not on file  Social History Narrative  . Not on file   Social Determinants of Health   Financial Resource Strain: Low Risk  (05/22/2022)   Overall Financial Resource Strain (CARDIA)   . Difficulty of Paying Living Expenses: Not hard at all  Food Insecurity: No Food Insecurity (05/21/2022)   Hunger Vital Sign   . Worried About Charity fundraiser in the Last Year: Never true   . Ran Out of Food in the Last Year: Never true  Transportation Needs: No Transportation Needs (05/22/2022)   PRAPARE - Transportation   . Lack of Transportation (Medical): No   .  Lack of Transportation (Non-Medical): No  Physical Activity: Not on file  Stress: Not on file  Social Connections: Not on file  Intimate Partner Violence: Not At Risk (05/21/2022)   Humiliation, Afraid, Rape, and Kick questionnaire   . Fear of Current or Ex-Partner: No   . Emotionally Abused: No   . Physically Abused: No   . Sexually Abused: No    Family History:   *** Family History  Problem Relation Age of Onset  . Diabetes Mother   . Hypertension Sister   . Diabetes Brother   . Colon cancer Neg Hx   . Heart attack Neg Hx   . Stroke Neg Hx   . Colon polyps Neg Hx   . Esophageal cancer Neg Hx   . Rectal cancer Neg Hx   . Stomach cancer Neg Hx      ROS:  Please see the history of present illness.  *** All other ROS reviewed and negative.     Physical Exam/Data:   Vitals:   06/05/22 1850 06/05/22 1856  BP:  (!) 99/52  Pulse:  83  Resp:  17  Temp: 97.6 F (36.4 C)   TempSrc: Oral   SpO2:  100%  Weight:  81.6 kg  Height:  _0  (1.473 m)   No intake or output data in the 24 hours ending 06/05/22 2131    06/05/2022    6:56 PM 06/02/2022   11:54 AM 05/28/2022   12:38 AM  Last 3 Weights  Weight (lbs) 180 lb 186 lb 12.8 oz 188 lb 8 oz  Weight (kg) 81.647 kg 84.732 kg 85.503 kg     Body mass index is 37.62 kg/m.  General:  Well nourished, well developed, in no acute distress*** HEENT: normal Neck: no JVD Vascular: No carotid bruits; Distal pulses 2+ bilaterally Cardiac:  normal S1, S2; RRR; no murmur *** Lungs:  clear to auscultation bilaterally, no wheezing, rhonchi or rales  Abd: soft, nontender, no hepatomegaly  Ext: no edema Musculoskeletal:  No deformities, BUE and BLE strength normal and equal Skin: warm and dry  Neuro:  CNs 2-12 intact, no focal abnormalities noted Psych:  Normal  affect   EKG:  The EKG was personally reviewed and demonstrates:  *** Telemetry:  Telemetry was personally reviewed and demonstrates:  ***  Relevant CV  Studies: ***  Laboratory Data:  High Sensitivity Troponin:  No results for input(s): "TROPONINIHS" in the last 720 hours.   Chemistry Recent Labs  Lab 06/02/22 1247 06/05/22 2018  NA 136 137  K 3.5 3.1*  CL 90* 90*  CO2 33* 32  GLUCOSE 165* 160*  BUN 115* 161*  CREATININE 3.32* 4.10*  CALCIUM 8.9 8.5*  GFRNONAA 13* 10*  ANIONGAP 13 15    Recent Labs  Lab 06/02/22 1247 06/05/22 2018  PROT 5.9* 5.5*  ALBUMIN 3.1* 2.6*  AST 15 24  ALT 9 12  ALKPHOS 73 66  BILITOT 1.3* 1.3*   Lipids No results for input(s): "CHOL", "TRIG", "HDL", "LABVLDL", "LDLCALC", "CHOLHDL" in the last 168 hours.  Hematology Recent Labs  Lab 06/05/22 2018  WBC 8.7  RBC 2.16*  HGB 6.9*  HCT 21.8*  MCV 100.9*  MCH 31.9  MCHC 31.7  RDW 16.8*  PLT 209   Thyroid No results for input(s): "TSH", "FREET4" in the last 168 hours.  BNP Recent Labs  Lab 06/02/22 1247  BNP 2,414.4*    DDimer No results for input(s): "DDIMER" in the last 168 hours.   Radiology/Studies:  DG Chest Port 1 View  Result Date: 06/05/2022 CLINICAL DATA:  Cough and rectal bleeding EXAM: PORTABLE CHEST 1 VIEW COMPARISON:  Radiographs 05/22/2022 FINDINGS: Similar cardiomegaly. Aortic atherosclerotic calcification. Left chest wall dual-chamber pacemaker. Similar chronic bronchitic changes. Pulmonary vascular congestion. Left-greater-than-right basilar atelectasis. Question small left pleural effusion. No pneumothorax. No definite acute osseous abnormality. IMPRESSION: Retrocardiac atelectasis/consolidation. Pneumonia is difficult to exclude. Cardiomegaly and pulmonary vascular congestion. Electronically Signed   By: Placido Sou M.D.   On: 06/05/2022 20:07     Assessment and Plan:   ***   Risk Assessment/Risk Scores:  {Complete the following score calculators/questions to meet required metrics.  Press F2         :[id number]}   {Is the patient being seen for unstable angina, ACS, NSTEMI or STEMI?:[phone]} {Does  this patient have CHF or CHF symptoms?      :[id number]} {Does this patient have ATRIAL FIBRILLATION?:[phone]}  {Are we signing off today?:[id number]}  For questions or updates, please contact [place] Please consult [email or web address] for contact info under    Signed, Hershal Coria, MD  06/05/2022 9:31 PM

## 2022-06-05 NOTE — Consult Note (Signed)
Cardiology Consultation   Patient ID: Tricia Gonzales MRN: [id number]; DOB: [birth year]  Admit date: 06/05/2022 Date of Consult: 06/05/2022  PCP:  Carol Ada, [place] [facility] Cardiologist:  Evalina Field, MD  Electrophysiologist:  Cristopher Peru, MD  { Click here to update MD or APP on Care Team, Refresh:1}     Patient Profile:   Tricia Gonzales is a 81 y.o. female with a hx of *** who is being seen 06/05/2022 for the evaluation of *** at the request of ***.  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   Anxiety    Arthritis    Atrial fibrillation ([place])    Avascular necrosis (HCC) hip left   leg pain also   Chronic back pain    Chronic kidney disease (CKD), stage III (moderate) (HCC)    Depression    Diverticulosis    Early cataracts, bilateral    Esophageal stricture    Gastritis    GAVE (gastric antral vascular ectasia)    GERD (gastroesophageal reflux disease)    Glaucoma    Heart murmur     " some doctors say that I have one some say that I don"t "   Hiatal hernia    History of blood transfusion    "@ least w/1st knee OR"   History of gout    History of kidney stones    Hyperlipidemia    Hypertension    Intestinal obstruction ([place])    Kidney stones    Obesity    Osteoarthritis    Osteoporosis    Pericarditis 2016   Presence of permanent cardiac pacemaker    Sjogren's disease ([place])    Thyroid disease    Type II diabetes mellitus ([place])    Vitamin B12 deficiency     Past Surgical History:  Procedure Laterality Date   ABDOMINAL HYSTERECTOMY     complete   CARDIOVERSION N/A 07/31/2020   Procedure: CARDIOVERSION;  Surgeon: Werner Lean, MD;  Location: [facility];  Service: Cardiovascular;  Laterality: N/A;   CHOLECYSTECTOMY OPEN     COLECTOMY     for rectovaginal fistula   COLONOSCOPY  01/07/2012   Procedure:  COLONOSCOPY;  Surgeon: Lafayette Dragon, MD;  Location: [facility];  Service: Endoscopy;  Laterality: N/A;   EP IMPLANTABLE DEVICE N/A 06/18/2015   Procedure: Pacemaker Implant;  Surgeon: Evans Lance, MD;  Location: [place];  Service: Cardiovascular;  Laterality: N/A;   ESOPHAGEAL DILATION  08/17/2020   Procedure: ESOPHAGEAL DILATION;  Surgeon: Lavena Bullion, DO;  Location: [facility];  Service: Gastroenterology;;   ESOPHAGOGASTRODUODENOSCOPY N/A 11/24/2018   Procedure: ESOPHAGOGASTRODUODENOSCOPY (EGD);  Surgeon: Yetta Flock, MD;  Location: [facility];  Service: Gastroenterology;  Laterality: N/A;   ESOPHAGOGASTRODUODENOSCOPY (EGD) WITH ESOPHAGEAL DILATION     ESOPHAGOGASTRODUODENOSCOPY (EGD) WITH PROPOFOL N/A 08/12/2015   Procedure: ESOPHAGOGASTRODUODENOSCOPY (EGD) WITH PROPOFOL;  Surgeon: Manus Gunning, MD;  Location: [place];  Service: Gastroenterology;  Laterality: N/A;   ESOPHAGOGASTRODUODENOSCOPY (EGD) WITH PROPOFOL N/A 08/17/2020   Procedure: ESOPHAGOGASTRODUODENOSCOPY (EGD) WITH PROPOFOL;  Surgeon: Lavena Bullion, DO;  Location: [place];  Service: Gastroenterology;  Laterality: N/A;   EYE SURGERY Bilateral    cataracts   HOT HEMOSTASIS N/A 11/24/2018   Procedure: HOT HEMOSTASIS (ARGON PLASMA COAGULATION/BICAP);  Surgeon: Yetta Flock, MD;  Location:  [facility];  Service: Gastroenterology;  Laterality: N/A;   HOT HEMOSTASIS N/A 08/17/2020   Procedure: HOT HEMOSTASIS (ARGON PLASMA COAGULATION/BICAP);  Surgeon: Lavena Bullion, DO;  Location: [facility];  Service: Gastroenterology;  Laterality: N/A;   I & D EXTREMITY Right 08/06/2016   Procedure: DEBRIDEMENT PIP RIGHT RING FINGER;  Surgeon: Daryll Brod, MD;  Location: [facility];  Service: Orthopedics;  Laterality: Right;   INSERT / REPLACE / REMOVE PACEMAKER     JOINT REPLACEMENT     KNEE ARTHROSCOPY Right    MASS EXCISION Right 08/06/2016   Procedure: EXCISION CYST;  Surgeon: Daryll Brod, MD;  Location: [facility];  Service: Orthopedics;  Laterality: Right;   REVISION TOTAL KNEE ARTHROPLASTY Left    TOTAL KNEE ARTHROPLASTY Bilateral      {Home Medications (Optional):21181}  Inpatient Medications: Scheduled Meds:  sodium chloride   Intravenous Once   Continuous Infusions:  azithromycin (ZITHROMAX) 500 mg in sodium chloride 0.9 % 250 mL IVPB     cefTRIAXone (ROCEPHIN)  IV     lactated ringers     PRN Meds:   Allergies:    Allergies  Allergen Reactions   Norvasc [Amlodipine Besylate] Shortness Of Breath   Other Rash and Other (See Comments)    Pt voiced she is allergic to microfiber materials/ blankets/sheets!!   Amlodipine Besylate     Other reaction(s): SOB   Avapro [Irbesartan] Other (See Comments)    Headaches   Glucophage [Metformin Hydrochloride] Other (See Comments)    "increases creatinine" and diarrhea   Lisinopril     Headache    Losartan Other (See Comments)    Headaches    Lumigan [Bimatoprost] Other (See Comments)    Eye burning and irritation   Morphine And Related Other (See Comments)    "Hallucinations"   Simvastatin Other (See Comments)    Body aches   Trulicity [Dulaglutide] Other (See Comments)    Severe mood swings    Social History:   Social History   Socioeconomic History   Marital status: Divorced    Spouse name: Not on file   Number of children: 2   Years of education: Not on file   Highest education level: High school graduate  Occupational History   Occupation: retired  Tobacco Use   Smoking status: Former    Packs/day: 2.00    Years: 3.00    Total pack years: 6.00    Types: Cigarettes    Quit date: 06/23/1983    Years since quitting: 38.9    Passive exposure: Never   Smokeless tobacco: Never  Vaping Use   Vaping Use: Never used  Substance and Sexual Activity   Alcohol use: Yes    Alcohol/week: 2.0 standard drinks of alcohol    Types: 2 Glasses of wine per week    Comment: occ   Drug use: No   Sexual  activity: Not Currently  Other Topics Concern   Not on file  Social History Narrative   Not on file   Social Determinants of Health   Financial Resource Strain: Low Risk  (05/22/2022)   Overall Financial Resource Strain (CARDIA)    Difficulty of Paying Living Expenses: Not hard at all  Food Insecurity: No Food Insecurity (05/21/2022)   Hunger Vital Sign    Worried About Running Out of Food in the Last Year: Never true    Ran Out of Food in the Last Year: Never true  Transportation Needs: No Transportation Needs (05/22/2022)   PRAPARE -  Hydrologist (Medical): No    Lack of Transportation (Non-Medical): No  Physical Activity: Not on file  Stress: Not on file  Social Connections: Not on file  Intimate Partner Violence: Not At Risk (05/21/2022)   Humiliation, Afraid, Rape, and Kick questionnaire    Fear of Current or Ex-Partner: No    Emotionally Abused: No    Physically Abused: No    Sexually Abused: No    Family History:   *** Family History  Problem Relation Age of Onset   Diabetes Mother    Hypertension Sister    Diabetes Brother    Colon cancer Neg Hx    Heart attack Neg Hx    Stroke Neg Hx    Colon polyps Neg Hx    Esophageal cancer Neg Hx    Rectal cancer Neg Hx    Stomach cancer Neg Hx      ROS:  Please see the history of present illness.  *** All other ROS reviewed and negative.     Physical Exam/Data:   Vitals:   06/05/22 1850 06/05/22 1856  BP:  (!) 99/52  Pulse:  83  Resp:  17  Temp: 97.6 F (36.4 C)   TempSrc: Oral   SpO2:  100%  Weight:  81.6 kg  Height:  _0  (1.473 m)   No intake or output data in the 24 hours ending 06/05/22 2131    06/05/2022    6:56 PM 06/02/2022   11:54 AM 05/28/2022   12:38 AM  Last 3 Weights  Weight (lbs) 180 lb 186 lb 12.8 oz 188 lb 8 oz  Weight (kg) 81.647 kg 84.732 kg 85.503 kg     Body mass index is 37.62 kg/m.  General:  Well nourished, well developed, in no acute  distress*** HEENT: normal Neck: no JVD Vascular: No carotid bruits; Distal pulses 2+ bilaterally Cardiac:  normal S1, S2; RRR; no murmur *** Lungs:  clear to auscultation bilaterally, no wheezing, rhonchi or rales  Abd: soft, nontender, no hepatomegaly  Ext: no edema Musculoskeletal:  No deformities, BUE and BLE strength normal and equal Skin: warm and dry  Neuro:  CNs 2-12 intact, no focal abnormalities noted Psych:  Normal affect   EKG:  The EKG was personally reviewed and demonstrates:  *** Telemetry:  Telemetry was personally reviewed and demonstrates:  ***  Relevant CV Studies: ***  Laboratory Data:  High Sensitivity Troponin:  No results for input(s): "TROPONINIHS" in the last 720 hours.   Chemistry Recent Labs  Lab 06/02/22 1247 06/05/22 2018  NA 136 137  K 3.5 3.1*  CL 90* 90*  CO2 33* 32  GLUCOSE 165* 160*  BUN 115* 161*  CREATININE 3.32* 4.10*  CALCIUM 8.9 8.5*  GFRNONAA 13* 10*  ANIONGAP 13 15    Recent Labs  Lab 06/02/22 1247 06/05/22 2018  PROT 5.9* 5.5*  ALBUMIN 3.1* 2.6*  AST 15 24  ALT 9 12  ALKPHOS 73 66  BILITOT 1.3* 1.3*   Lipids No results for input(s): "CHOL", "TRIG", "HDL", "LABVLDL", "LDLCALC", "CHOLHDL" in the last 168 hours.  Hematology Recent Labs  Lab 06/05/22 2018  WBC 8.7  RBC 2.16*  HGB 6.9*  HCT 21.8*  MCV 100.9*  MCH 31.9  MCHC 31.7  RDW 16.8*  PLT 209   Thyroid No results for input(s): "TSH", "FREET4" in the last 168 hours.  BNP Recent Labs  Lab 06/02/22 1247  BNP 2,414.4*    DDimer No  results for input(s): "DDIMER" in the last 168 hours.   Radiology/Studies:  DG Chest Port 1 View  Result Date: 06/05/2022 CLINICAL DATA:  Cough and rectal bleeding EXAM: PORTABLE CHEST 1 VIEW COMPARISON:  Radiographs 05/22/2022 FINDINGS: Similar cardiomegaly. Aortic atherosclerotic calcification. Left chest wall dual-chamber pacemaker. Similar chronic bronchitic changes. Pulmonary vascular congestion. Left-greater-than-right  basilar atelectasis. Question small left pleural effusion. No pneumothorax. No definite acute osseous abnormality. IMPRESSION: Retrocardiac atelectasis/consolidation. Pneumonia is difficult to exclude. Cardiomegaly and pulmonary vascular congestion. Electronically Signed   By: Placido Sou M.D.   On: 06/05/2022 20:07     Assessment and Plan:   ***   Risk Assessment/Risk Scores:  {Complete the following score calculators/questions to meet required metrics.  Press F2         :[id number]}   {Is the patient being seen for unstable angina, ACS, NSTEMI or STEMI?[phone]} {Does this patient have CHF or CHF symptoms?      :[id number]} {Does this patient have ATRIAL FIBRILLATION?:[phone]}  {Are we signing off today?:[id number]}  For questions or updates, please contact [place] Please consult [email or web address] for contact info under    Signed, Hershal Coria, MD  06/05/2022 9:31 PM

## 2022-06-05 NOTE — H&P (Signed)
History and Physical      Tricia Gonzales VKF:[id number] DOB: 1940/12/26 DOA: 06/05/2022  PCP: Carol Ada, MD  Patient coming from: home   I have personally briefly reviewed patient's old medical records in [place]  Chief Complaint: Subjective fever  HPI: Tricia Gonzales is a 81 y.o. 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   The following history is provided by the patient as well as my discussions with the patient's daughter, who is present at bedside, in addition to my discussions with EDP and via chart review.  Patient was recently hospitalized on the cardiology service at [facility] from***for diuresis in the setting of acute on chronic systolic heart failure.  She was subsequent discharged home on 05/28/2022 for developing subjective fever over the course of the last 2 to 3 days.  This has been associated new onset productive cough in the absence of hemoptysis.  She denies associated rigors, generalized myalgias, or chills.  No associated any dysuria, gross hematuria, rash, neck stiffness.  She also denies any recent chest pain, palpitations, diaphoresis, dizziness, presyncope, or syncope.  Following recent discharge from the hospital, she has not noted any worsening of her peripheral edema.  Subsequently, she experienced a single episode of dark-colored stool earlier today, which she states is new for her, and  ultimately prompted her to present to [facility] emergency department this evening for further evaluation management thereof.  Denies any recent hematochezia.  However, she has noted new onset epigastric discomfort over the last 2 to 3 weeks.  Denies any recent nausea, vomiting, hematemesis.  In the setting of a history of atrial fibrillation, she is chronically anticoagulated on Eliquis, with most recent dose occurring on the morning of 06/04/2022.  Not on a antiplatelet medications at home.  No recent trauma.  Denies any routine or recent use of NSAIDs.  Denies any history of alcohol abuse.  No known history of underlying liver disease.   Per chart review, she has a documented history of GERD, gastritis, and GAVE.  She follows [name] on an outpatient basis, and is on daily Protonix at home.  Per chart review, she has history of anemia of chronic disease associated with baseline hemoglobin 9-11, with most recent prior hemoglobin noted to be 9.1 on 05/22/2022.  She is on daily oral iron supplementation as an outpatient.  She also has a documented history of diverticulosis.  Cardiac history is also notable for chronic biventricular systolic heart failure, with most recent echocardiogram performed in September 2023, which was notable for LVEF 25 to 30%, indeterminate diastolic parameters, moderately reduced right ventricular systolic function, moderately dilated left atrium, mild to moderately dilated right atrium, moderate mitral vegetation and severe tricuspid regurgitation.     ED Course:  Vital signs in the ED were notable for the following: Afebrile; heart rates in the range of 06-77; systolic blood pressures in the low 100s to 130s; respiratory rate 16-20, oxygen saturation 96 to 100% on her baseline 2 L nasal cannula.   Labs were  notable for the following: CMP notable for potassium 3.1, BUN 160 compared to 115 on 06/02/2022, creatinine 4.1 compared to most recent prior  value 3.2 on 06/02/2022, calcium, just from out of blue of anemia 9.7, albumin 2.6, otherwise liver enzymes within normal limits.  CBC notable for will with some count 8700, hemoglobin 6.9 cystolith microcytic/normochromic findings as well as elevated RDW, platelet count 209.  INR 2.1.  DRE***.  COVID, influenza, RSV PCR pending.  EDP performed DRE revealing the appearance of melena, associated with fecal occult blood positive result.  Per my interpretation, EKG in ED demonstrated the following: Presenting EKG relative to most recent prior performed on 06/02/2022, which appeared to be consistent with baseline today appears to be no longer paced while showing atrial fibrillation with right bundle branch block, heart rate 80, T wave inversions in 2 3, aVF, V1 through V6, less than 1 mm ST depression in V2 through V3 as well as V5, V6, in the absence of any evidence of ST elevation.  Imaging and additional notable ED work-up: Chest x-ray, per formal radiology report and in comparison to most recent prior chest x-ray from 05/22/2022 shows interval development of retrocardiac airspace opacity suggestive of pneumonia, while showing pulmonary vascular congestion without overt evidence of pulmonary or interstitial edema, also showing no evidence of pleural effusion or pneumothorax.  As the patient follows with Dr. Therisa Doyne [facility] gastroenterology as an outpatient, EDP contacted on-call [facility] (Dr. Alessandra Bevels) requesting formal consult for suspected acute upper GI bleed.  Additionally, in the setting of the patient's complicated heart failure history that includes chronic biventricular systolic heart failure prompting recent admission to the cardiology service, EDP discussed case with on-call cardiology (Dr. Conley Canal) requesting consult additional guidance regarding recs for ensuing associated  management/diuresis.    While in the ED, the following were administered: EDP ordered transfusion of 2 units PRBC,  azithromycin, Rocephin.  Subsequently, the patient was admitted for further evaluation management of suspected presenting acute upper gastrointestinal bleed complicated by acute blood loss anemia superimposed on anemia of chronic disease, with presentation also notable for suspected community-acquired pneumonia, as well as notable laboratory findings include hypokalemia as well as acute kidney injury superimposed on CKD 4.    Review of Systems: As per HPI otherwise 10 point review of systems negative.   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   Anxiety    Arthritis    Atrial fibrillation ([place])    Avascular necrosis (HCC) hip left   leg pain also   Chronic back pain    Chronic kidney disease (CKD), stage III (moderate) (HCC)    Depression    Diverticulosis    Early cataracts, bilateral    Esophageal stricture    Gastritis    GAVE (gastric antral vascular ectasia)    GERD (gastroesophageal reflux disease)    Glaucoma    Heart murmur     " some doctors say that I have one some say that I don"t "   Hiatal hernia    History of blood transfusion    "@ least w/1st knee OR"   History of gout    History of kidney stones    Hyperlipidemia    Hypertension    Intestinal obstruction ([place])    Kidney stones    Obesity    Osteoarthritis    Osteoporosis    Pericarditis 2016   Presence of permanent cardiac pacemaker  Sjogren's disease ([place])    Thyroid disease    Type II diabetes mellitus ([place])    Vitamin B12 deficiency     Past Surgical History:  Procedure Laterality Date   ABDOMINAL HYSTERECTOMY     complete   CARDIOVERSION N/A 07/31/2020   Procedure: CARDIOVERSION;  Surgeon: Werner Lean, MD;  Location: [facility] ENDOSCOPY;  Service: Cardiovascular;  Laterality: N/A;   CHOLECYSTECTOMY OPEN     COLECTOMY     for rectovaginal fistula   COLONOSCOPY  01/07/2012   Procedure: COLONOSCOPY;  Surgeon: Lafayette Dragon, MD;   Location: [facility] ENDOSCOPY;  Service: Endoscopy;  Laterality: N/A;   EP IMPLANTABLE DEVICE N/A 06/18/2015   Procedure: Pacemaker Implant;  Surgeon: Evans Lance, MD;  Location: [place] CV LAB;  Service: Cardiovascular;  Laterality: N/A;   ESOPHAGEAL DILATION  08/17/2020   Procedure: ESOPHAGEAL DILATION;  Surgeon: Lavena Bullion, DO;  Location: [place] ENDOSCOPY;  Service: Gastroenterology;;   ESOPHAGOGASTRODUODENOSCOPY N/A 11/24/2018   Procedure: ESOPHAGOGASTRODUODENOSCOPY (EGD);  Surgeon: Yetta Flock, MD;  Location: [facility];  Service: Gastroenterology;  Laterality: N/A;   ESOPHAGOGASTRODUODENOSCOPY (EGD) WITH ESOPHAGEAL DILATION     ESOPHAGOGASTRODUODENOSCOPY (EGD) WITH PROPOFOL N/A 08/12/2015   Procedure: ESOPHAGOGASTRODUODENOSCOPY (EGD) WITH PROPOFOL;  Surgeon: Manus Gunning, MD;  Location: [place];  Service: Gastroenterology;  Laterality: N/A;   ESOPHAGOGASTRODUODENOSCOPY (EGD) WITH PROPOFOL N/A 08/17/2020   Procedure: ESOPHAGOGASTRODUODENOSCOPY (EGD) WITH PROPOFOL;  Surgeon: Lavena Bullion, DO;  Location: [place];  Service: Gastroenterology;  Laterality: N/A;   EYE SURGERY Bilateral    cataracts   HOT HEMOSTASIS N/A 11/24/2018   Procedure: HOT HEMOSTASIS (ARGON PLASMA COAGULATION/BICAP);  Surgeon: Yetta Flock, MD;  Location: [facility];  Service: Gastroenterology;  Laterality: N/A;   HOT HEMOSTASIS N/A 08/17/2020   Procedure: HOT HEMOSTASIS (ARGON PLASMA COAGULATION/BICAP);  Surgeon: Lavena Bullion, DO;  Location: [facility] ENDOSCOPY;  Service: Gastroenterology;  Laterality: N/A;   I & D EXTREMITY Right 08/06/2016   Procedure: DEBRIDEMENT PIP RIGHT RING FINGER;  Surgeon: Daryll Brod, MD;  Location: [place];  Service: Orthopedics;  Laterality: Right;   INSERT / REPLACE / REMOVE PACEMAKER     JOINT REPLACEMENT     KNEE ARTHROSCOPY Right    MASS EXCISION Right 08/06/2016   Procedure: EXCISION CYST;  Surgeon: Daryll Brod, MD;  Location: [place];  Service:  Orthopedics;  Laterality: Right;   REVISION TOTAL KNEE ARTHROPLASTY Left    TOTAL KNEE ARTHROPLASTY Bilateral     Social History:  reports that she quit smoking about 38 years ago. Her smoking use included cigarettes. She has a 6.00 pack-year smoking history. She has never been exposed to tobacco smoke. She has never used smokeless tobacco. She reports current alcohol use of about 2.0 standard drinks of alcohol per week. She reports that she does not use drugs.   Allergies  Allergen Reactions   Norvasc [Amlodipine Besylate] Shortness Of Breath   Other Rash and Other (See Comments)    Pt voiced she is allergic to microfiber materials/ blankets/sheets!!   Amlodipine Besylate     Other reaction(s): SOB   Avapro [Irbesartan] Other (See Comments)    Headaches   Glucophage [Metformin Hydrochloride] Other (See Comments)    "increases creatinine" and diarrhea   Lisinopril     Headache    Losartan Other (See Comments)    Headaches    Lumigan [Bimatoprost] Other (See Comments)    Eye burning and irritation   Morphine And Related Other (See Comments)    "  Hallucinations"   Simvastatin Other (See Comments)    Body aches   Trulicity [Dulaglutide] Other (See Comments)    Severe mood swings    Family History  Problem Relation Age of Onset   Diabetes Mother    Hypertension Sister    Diabetes Brother    Colon cancer Neg Hx    Heart attack Neg Hx    Stroke Neg Hx    Colon polyps Neg Hx    Esophageal cancer Neg Hx    Rectal cancer Neg Hx    Stomach cancer Neg Hx     Family history reviewed and not pertinent ***   Prior to Admission medications   Medication Sig Start Date End Date Taking? Authorizing Provider  ACCU-CHEK AVIVA PLUS test strip 1 each by Other route daily.  03/15/15   [provider]  acetaminophen (TYLENOL) 650 MG CR tablet Take 1,300 mg by mouth every evening.    [provider]  BD PEN NEEDLE NANO U/F 32G X 4 MM MISC See admin instructions. 12/25/19    [provider]  carvedilol (COREG) 12.5 MG tablet Take 18.75 mg by mouth 2 (two) times daily with a meal.    [provider]  Cholecalciferol (VITAMIN D3) 25 MCG (1000 UT) CAPS Take 1,000 Units by mouth daily.    [provider]  cycloSPORINE (RESTASIS) 0.05 % ophthalmic emulsion Place 1 drop into both eyes 2 (two) times daily as needed (eye dryness). 10/30/20   [provider]  dicyclomine (BENTYL) 10 MG capsule Take 1 capsule (10 mg total) by mouth every 8 (eight) hours as needed for spasms. 10/07/21   Armbruster, Carlota Raspberry, MD  ELIQUIS 2.5 MG TABS tablet TAKE ONE TABLET TWICE DAILY 02/09/22   O'Neal, Cassie Freer, MD  febuxostat (ULORIC) 40 MG tablet Take 1 tablet (40 mg total) by mouth every Monday, Wednesday, and Friday. 05/12/19   Granville Lewis C, PA-C  ferrous sulfate 325 (65 FE) MG EC tablet Take 325 mg by mouth daily with breakfast.    [provider]  gabapentin (NEURONTIN) 400 MG capsule Take 400 mg by mouth 2 (two) times daily.    [provider]  hydrALAZINE (APRESOLINE) 25 MG tablet Take 1 tablet (25 mg total) by mouth every 8 (eight) hours. 05/28/22   Duke, Tami Lin, PA  isosorbide mononitrate (IMDUR) 30 MG 24 hr tablet Take 1 tablet (30 mg total) by mouth daily. 05/29/22   Duke, Tami Lin, PA  levothyroxine (SYNTHROID) 75 MCG tablet Take 75 mcg by mouth at bedtime. 04/14/21   [provider]  lidocaine (LIDODERM) 5 % Place 1-3 patches onto the skin See admin instructions. Apply 1-3 patches transdermally every 24 hours as needed for pain and remove & discard patches within 12 hours or as directed by MD    [provider]  metolazone (ZAROXOLYN) 5 MG tablet Take 1 tablet (5 mg total) by mouth every Monday. 06/01/22   Duke, Tami Lin, PA  montelukast (SINGULAIR) 10 MG tablet Take 1 tablet (10 mg total) by mouth at bedtime. 05/12/19   Granville Lewis C, PA-C  nystatin ([place]) powder APPLY TOPICALLY TO LEFT  ABDOMINAL FOLD AND GROIN TWICE A DAY Patient taking differently: Apply 1 Application topically daily as needed (Fungal infection). APPLY TOPICALLY TO LEFT ABDOMINAL FOLD AND GROIN 05/12/19   Granville Lewis C, PA-C  ondansetron (ZOFRAN-ODT) 4 MG disintegrating tablet Take 1 tablet (4 mg total) by mouth every 8 (eight) hours as needed for nausea  or vomiting. 11/11/21   Armbruster, Carlota Raspberry, MD  pantoprazole (PROTONIX) 40 MG tablet Take 40 mg by mouth daily.    [provider]  pentoxifylline (TRENTAL) 400 MG CR tablet Take 400 mg by mouth daily. With a meal    [provider]  rOPINIRole (REQUIP) 0.5 MG tablet Take 0.5 mg by mouth at bedtime. 02/23/20   [provider]  rosuvastatin (CRESTOR) 5 MG tablet Take 1 tablet (5 mg total) by mouth daily. 05/12/19   Granville Lewis C, PA-C  sennosides-docusate sodium (SENOKOT-S) 8.6-50 MG tablet Take 1 tablet by mouth daily.    [provider]  SF 5000 PLUS 1.1 % CREA dental cream Take by mouth as directed. 12/13/21   [provider]  SIMBRINZA 1-0.2 % SUSP Place 1 drop into the right eye 2 (two) times daily. 03/24/22   [provider]  torsemide (DEMADEX) 20 MG tablet Take 2 tablets (40 mg total) by mouth daily. 03/27/22   Almyra Deforest, PA  traMADol (ULTRAM) 50 MG tablet Take 50 mg by mouth 2 (two) times daily. 02/13/20   [provider]  TRESIBA FLEXTOUCH 100 UNIT/ML FlexTouch Pen Inject 8-10 Units into the skin every evening. 02/21/20   [provider]     Objective    Physical Exam: Vitals:   06/05/22 1850 06/05/22 1856  BP:  (!) 99/52  Pulse:  83  Resp:  17  Temp: 97.6 F (36.4 C)   TempSrc: Oral   SpO2:  100%  Weight:  81.6 kg  Height:  _0  (1.473 m)    General: appears to be stated age; alert, oriented Skin: warm, dry, no rash Head:  AT/[place] Mouth:  Oral mucosa membranes appear moist, normal dentition Neck: supple; trachea midline Heart:  RRR; did not appreciate any  M/R/G Lungs: CTAB, did not appreciate any wheezes, rales, or rhonchi Abdomen: + BS; soft, ND, NT Vascular: 2+ pedal pulses b/l; 2+ radial pulses b/l Extremities: no peripheral edema, no muscle wasting Neuro: strength and sensation intact in upper and lower extremities b/l ***   *** Neuro: 5/5 strength of the proximal and distal flexors and extensors of the upper and lower extremities bilaterally; sensation intact in upper and lower extremities b/l; cranial nerves II through XII grossly intact; no pronator drift; no evidence suggestive of slurred speech, dysarthria, or facial droop; Normal muscle tone. No tremors.  *** Neuro: In the setting of the patient's current mental status and associated inability to follow instructions, unable to perform full neurologic exam at this time.  As such, assessment of strength, sensation, and cranial nerves is limited at this time. Patient noted to spontaneously move all 4 extremities. No tremors.  ***    Labs on Admission: I have personally reviewed following labs and imaging studies  CBC: Recent Labs  Lab 06/05/22 2018  WBC 8.7  HGB 6.9*  HCT 21.8*  MCV 100.9*  PLT 712   Basic Metabolic Panel: Recent Labs  Lab 06/02/22 1247 06/05/22 2018  NA 136 137  K 3.5 3.1*  CL 90* 90*  CO2 33* 32  GLUCOSE 165* 160*  BUN 115* 161*  CREATININE 3.32* 4.10*  CALCIUM 8.9 8.5*   GFR: Estimated Creatinine Clearance: 9.9 mL/min (A) (by C-G formula based on SCr of 4.1 mg/dL (H)). Liver Function Tests: Recent Labs  Lab 06/02/22 1247 06/05/22 2018  AST 15 24  ALT 9 12  ALKPHOS 73 66  BILITOT 1.3* 1.3*  PROT 5.9* 5.5*  ALBUMIN 3.1*  2.6*   No results for input(s): "LIPASE", "AMYLASE" in the last 168 hours. No results for input(s): "AMMONIA" in the last 168 hours. Coagulation Profile: Recent Labs  Lab 06/05/22 2018  INR 2.1*   Cardiac Enzymes: No results for input(s): "CKTOTAL", "CKMB", "CKMBINDEX", "TROPONINI" in the last 168 hours. BNP  (last 3 results) Recent Labs    03/20/22 1028 04/02/22 1434  PROBNP 65,249* 69,331*   HbA1C: No results for input(s): "HGBA1C" in the last 72 hours. CBG: Recent Labs  Lab 06/05/22 1848  GLUCAP 154*   Lipid Profile: No results for input(s): "CHOL", "HDL", "LDLCALC", "TRIG", "CHOLHDL", "LDLDIRECT" in the last 72 hours. Thyroid Function Tests: No results for input(s): "TSH", "T4TOTAL", "FREET4", "T3FREE", "THYROIDAB" in the last 72 hours. Anemia Panel: No results for input(s): "VITAMINB12", "FOLATE", "FERRITIN", "TIBC", "IRON", "RETICCTPCT" in the last 72 hours. Urine analysis:    Component Value Date/Time   COLORURINE YELLOW 11/13/2021 [place] 11/13/2021 1138   LABSPEC 1.009 11/13/2021 1138   PHURINE 6.0 11/13/2021 1138   GLUCOSEU NEGATIVE 11/13/2021 1138   HGBUR 1+ (A) 11/13/2021 1138   BILIRUBINUR NEGATIVE 08/09/2015 0556   KETONESUR NEGATIVE 11/13/2021 1138   PROTEINUR TRACE (A) 11/13/2021 1138   UROBILINOGEN 0.2 03/11/2010 0916   NITRITE NEGATIVE 11/13/2021 1138   LEUKOCYTESUR NEGATIVE 11/13/2021 1138    Radiological Exams on Admission: DG Chest Port 1 View  Result Date: 06/05/2022 CLINICAL DATA:  Cough and rectal bleeding EXAM: PORTABLE CHEST 1 VIEW COMPARISON:  Radiographs 05/22/2022 FINDINGS: Similar cardiomegaly. Aortic atherosclerotic calcification. Left chest wall dual-chamber pacemaker. Similar chronic bronchitic changes. Pulmonary vascular congestion. Left-greater-than-right basilar atelectasis. Question small left pleural effusion. No pneumothorax. No definite acute osseous abnormality. IMPRESSION: Retrocardiac atelectasis/consolidation. Pneumonia is difficult to exclude. Cardiomegaly and pulmonary vascular congestion. Electronically Signed   By: Placido Sou M.D.   On: 06/05/2022 20:07      Assessment/Plan   Principal Problem:   Acute upper GI  bleed   ***       ***            ***             ***            ***            ***            ***             ***           ***           ***   ***  DVT prophylaxis: SCD's   Code Status: DNR; Per my discussions tonight with the patient and her daughter, who is present at bedside, the patient has expressed her wishes to be DNR.  She would not want chest compressions, antiarrhythmics, electrical shocks, or intubation in the setting of cardiac arrest.    Family Communication: I discussed the patient's case with her daughter, who was present at bedside Disposition Plan: Per Rounding Team Consults called: EDP contacted on-call [facility] (Dr. Alessandra Bevels) requesting formal consult for suspected acute upper GI bleed, as further detailed above; Additionally, EDP discussed case with on-call cardiology (Dr. Conley Canal) requesting consult further assistance with management of her complicated heart failure history involving chronic biventricular systolic heart failure, including additional guidance regarding recs for ensuing diuresis.   Admission status: Inpatient     I SPENT GREATER THAN 75 *** MINUTES IN CLINICAL CARE TIME/MEDICAL DECISION-MAKING IN COMPLETING  THIS ADMISSION.      [place] DO [facility]  From [place]   06/05/2022, 10:33 PM   ***

## 2022-06-06 ENCOUNTER — Encounter (HOSPITAL_COMMUNITY): Payer: Self-pay | Admitting: Internal Medicine

## 2022-06-06 DIAGNOSIS — D62 Acute posthemorrhagic anemia: Secondary | ICD-10-CM | POA: Diagnosis not present

## 2022-06-06 DIAGNOSIS — I5022 Chronic systolic (congestive) heart failure: Secondary | ICD-10-CM | POA: Diagnosis present

## 2022-06-06 DIAGNOSIS — E782 Mixed hyperlipidemia: Secondary | ICD-10-CM | POA: Diagnosis not present

## 2022-06-06 DIAGNOSIS — K922 Gastrointestinal hemorrhage, unspecified: Secondary | ICD-10-CM | POA: Diagnosis not present

## 2022-06-06 DIAGNOSIS — E876 Hypokalemia: Secondary | ICD-10-CM | POA: Diagnosis present

## 2022-06-06 DIAGNOSIS — J189 Pneumonia, unspecified organism: Secondary | ICD-10-CM | POA: Diagnosis present

## 2022-06-06 DIAGNOSIS — E039 Hypothyroidism, unspecified: Secondary | ICD-10-CM | POA: Diagnosis present

## 2022-06-06 LAB — URINALYSIS, COMPLETE (UACMP) WITH MICROSCOPIC
Bilirubin Urine: NEGATIVE
Glucose, UA: NEGATIVE mg/dL
Ketones, ur: NEGATIVE mg/dL
Leukocytes,Ua: NEGATIVE
Nitrite: NEGATIVE
Protein, ur: 30 mg/dL — AB
Specific Gravity, Urine: 1.013 (ref 1.005–1.030)
pH: 5 (ref 5.0–8.0)

## 2022-06-06 LAB — CBC WITH DIFFERENTIAL/PLATELET
Abs Immature Granulocytes: 0.11 10*3/uL — ABNORMAL HIGH (ref 0.00–0.07)
Basophils Absolute: 0 10*3/uL (ref 0.0–0.1)
Basophils Relative: 0 %
Eosinophils Absolute: 0 10*3/uL (ref 0.0–0.5)
Eosinophils Relative: 0 %
HCT: 23.4 % — ABNORMAL LOW (ref 36.0–46.0)
Hemoglobin: 7.5 g/dL — ABNORMAL LOW (ref 12.0–15.0)
Immature Granulocytes: 1 %
Lymphocytes Relative: 2 %
Lymphs Abs: 0.2 10*3/uL — ABNORMAL LOW (ref 0.7–4.0)
MCH: 31.6 pg (ref 26.0–34.0)
MCHC: 32.1 g/dL (ref 30.0–36.0)
MCV: 98.7 fL (ref 80.0–100.0)
Monocytes Absolute: 0.4 10*3/uL (ref 0.1–1.0)
Monocytes Relative: 4 %
Neutro Abs: 8 10*3/uL — ABNORMAL HIGH (ref 1.7–7.7)
Neutrophils Relative %: 93 %
Platelets: 204 10*3/uL (ref 150–400)
RBC: 2.37 MIL/uL — ABNORMAL LOW (ref 3.87–5.11)
RDW: 17.3 % — ABNORMAL HIGH (ref 11.5–15.5)
WBC: 8.7 10*3/uL (ref 4.0–10.5)
nRBC: 0 % (ref 0.0–0.2)

## 2022-06-06 LAB — FOLATE: Folate: 17.1 ng/mL (ref >5.9)

## 2022-06-06 LAB — CBC
HCT: 26.5 % — ABNORMAL LOW (ref 36.0–46.0)
HCT: 25.7 % — ABNORMAL LOW (ref 36.0–46.0)
Hemoglobin: 9.2 g/dL — ABNORMAL LOW (ref 12.0–15.0)
Hemoglobin: 8.3 g/dL — ABNORMAL LOW (ref 12.0–15.0)
MCH: 32.9 pg (ref 26.0–34.0)
MCH: 32.5 pg (ref 26.0–34.0)
MCHC: 34.7 g/dL (ref 30.0–36.0)
MCHC: 32.3 g/dL (ref 30.0–36.0)
MCV: 94.6 fL (ref 80.0–100.0)
MCV: 100.8 fL — ABNORMAL HIGH (ref 80.0–100.0)
Platelets: 229 10*3/uL (ref 150–400)
Platelets: 212 10*3/uL (ref 150–400)
RBC: 2.8 MIL/uL — ABNORMAL LOW (ref 3.87–5.11)
RBC: 2.55 MIL/uL — ABNORMAL LOW (ref 3.87–5.11)
RDW: 18.7 % — ABNORMAL HIGH (ref 11.5–15.5)
RDW: 19.7 % — ABNORMAL HIGH (ref 11.5–15.5)
WBC: 10 10*3/uL (ref 4.0–10.5)
WBC: 7.8 10*3/uL (ref 4.0–10.5)
nRBC: 0 % (ref 0.0–0.2)
nRBC: 0 % (ref 0.0–0.2)

## 2022-06-06 LAB — COMPREHENSIVE METABOLIC PANEL
ALT: 12 U/L (ref 0–44)
AST: 24 U/L (ref 15–41)
Albumin: 2.5 g/dL — ABNORMAL LOW (ref 3.5–5.0)
Alkaline Phosphatase: 63 U/L (ref 38–126)
Anion gap: 15 (ref 5–15)
BUN: 160 mg/dL — ABNORMAL HIGH (ref 8–23)
CO2: 32 mmol/L (ref 22–32)
Calcium: 8.5 mg/dL — ABNORMAL LOW (ref 8.9–10.3)
Chloride: 91 mmol/L — ABNORMAL LOW (ref 98–111)
Creatinine, Ser: 4 mg/dL — ABNORMAL HIGH (ref 0.44–1.00)
GFR, Estimated: 11 mL/min — ABNORMAL LOW (ref >60)
Glucose, Bld: 172 mg/dL — ABNORMAL HIGH (ref 70–99)
Potassium: 3.4 mmol/L — ABNORMAL LOW (ref 3.5–5.1)
Sodium: 138 mmol/L (ref 135–145)
Total Bilirubin: 1.5 mg/dL — ABNORMAL HIGH (ref 0.3–1.2)
Total Protein: 5.2 g/dL — ABNORMAL LOW (ref 6.5–8.1)

## 2022-06-06 LAB — SODIUM, URINE, RANDOM: Sodium, Ur: 27 mmol/L

## 2022-06-06 LAB — IRON AND TIBC
Iron: 96 ug/dL (ref 28–170)
Saturation Ratios: 47 % — ABNORMAL HIGH (ref 10.4–31.8)
TIBC: 203 ug/dL — ABNORMAL LOW (ref 250–450)
UIBC: 107 ug/dL

## 2022-06-06 LAB — HEMOGLOBIN AND HEMATOCRIT, BLOOD
HCT: 20.6 % — ABNORMAL LOW (ref 36.0–46.0)
Hemoglobin: 7 g/dL — ABNORMAL LOW (ref 12.0–15.0)

## 2022-06-06 LAB — MAGNESIUM: Magnesium: 2.2 mg/dL (ref 1.7–2.4)

## 2022-06-06 LAB — CBG MONITORING, ED
Glucose-Capillary: 166 mg/dL — ABNORMAL HIGH (ref 70–99)
Glucose-Capillary: 174 mg/dL — ABNORMAL HIGH (ref 70–99)
Glucose-Capillary: 127 mg/dL — ABNORMAL HIGH (ref 70–99)
Glucose-Capillary: 131 mg/dL — ABNORMAL HIGH (ref 70–99)

## 2022-06-06 LAB — PROCALCITONIN: Procalcitonin: 1.73 ng/mL

## 2022-06-06 LAB — RETICULOCYTES
RBC.: 2.37 MIL/uL — ABNORMAL LOW (ref 3.87–5.11)
Retic Count, Absolute: 42.5 10*3/uL (ref 19.0–186.0)
Retic Ct Pct: 1.8 % (ref 0.4–3.1)

## 2022-06-06 LAB — POTASSIUM: Potassium: 3.4 mmol/L — ABNORMAL LOW (ref 3.5–5.1)

## 2022-06-06 LAB — C-REACTIVE PROTEIN: CRP: 10.5 mg/dL — ABNORMAL HIGH (ref <1.0)

## 2022-06-06 LAB — CREATININE, URINE, RANDOM: Creatinine, Urine: 61 mg/dL

## 2022-06-06 LAB — STREP PNEUMONIAE URINARY ANTIGEN: Strep Pneumo Urinary Antigen: NEGATIVE

## 2022-06-06 LAB — VITAMIN B12: Vitamin B-12: 440 pg/mL (ref 180–914)

## 2022-06-06 LAB — D-DIMER, QUANTITATIVE: D-Dimer, Quant: 0.83 ug/mL-FEU — ABNORMAL HIGH (ref 0.00–0.50)

## 2022-06-06 LAB — FERRITIN: Ferritin: 244 ng/mL (ref 11–307)

## 2022-06-06 MED ORDER — ROSUVASTATIN CALCIUM 5 MG PO TABS
5.0000 mg | ORAL_TABLET | Freq: Every day | ORAL | Status: DC
  Administered 2022-06-06 – 2022-06-19 (×14): 5 mg via ORAL
  Filled 2022-06-06 (×14): qty 1

## 2022-06-06 MED ORDER — FEBUXOSTAT 40 MG PO TABS
40.0000 mg | ORAL_TABLET | ORAL | Status: DC
  Administered 2022-06-08 – 2022-06-19 (×6): 40 mg via ORAL
  Filled 2022-06-06 (×6): qty 1

## 2022-06-06 MED ORDER — POTASSIUM CHLORIDE 20 MEQ PO PACK
20.0000 meq | PACK | Freq: Once | ORAL | Status: AC
  Administered 2022-06-06: 20 meq via ORAL
  Filled 2022-06-06: qty 1

## 2022-06-06 MED ORDER — LEVOTHYROXINE SODIUM 75 MCG PO TABS
75.0000 ug | ORAL_TABLET | Freq: Every day | ORAL | Status: DC
  Administered 2022-06-07 – 2022-06-19 (×12): 75 ug via ORAL
  Filled 2022-06-06 (×14): qty 1

## 2022-06-06 MED ORDER — CARVEDILOL 12.5 MG PO TABS
12.5000 mg | ORAL_TABLET | Freq: Two times a day (BID) | ORAL | Status: DC
  Administered 2022-06-07 – 2022-06-19 (×26): 12.5 mg via ORAL
  Filled 2022-06-06 (×26): qty 1

## 2022-06-06 MED ORDER — VITAMIN K1 10 MG/ML IJ SOLN
5.0000 mg | Freq: Once | INTRAVENOUS | Status: AC
  Administered 2022-06-06: 5 mg via INTRAVENOUS
  Filled 2022-06-06: qty 0.5

## 2022-06-06 MED ORDER — MONTELUKAST SODIUM 10 MG PO TABS
10.0000 mg | ORAL_TABLET | Freq: Every day | ORAL | Status: DC
  Administered 2022-06-06 – 2022-06-18 (×13): 10 mg via ORAL
  Filled 2022-06-06 (×13): qty 1

## 2022-06-06 MED ORDER — ROPINIROLE HCL 1 MG PO TABS
0.5000 mg | ORAL_TABLET | Freq: Every day | ORAL | Status: DC
  Administered 2022-06-06 – 2022-06-18 (×13): 0.5 mg via ORAL
  Filled 2022-06-06 (×13): qty 1

## 2022-06-06 MED ORDER — SODIUM CHLORIDE 0.9 % IV SOLN
200.0000 mg | Freq: Once | INTRAVENOUS | Status: AC
  Administered 2022-06-06: 200 mg via INTRAVENOUS
  Filled 2022-06-06 (×2): qty 40

## 2022-06-06 MED ORDER — SODIUM CHLORIDE 0.9 % IV SOLN
100.0000 mg | Freq: Every day | INTRAVENOUS | Status: AC
  Administered 2022-06-07 – 2022-06-08 (×2): 100 mg via INTRAVENOUS
  Filled 2022-06-06 (×2): qty 20

## 2022-06-06 NOTE — Consult Note (Signed)
Referring Provider:  [place] Primary Care Physician:  Tricia Ada, MD Primary Gastroenterologist:  Dr. Therisa Doyne  Reason for Consultation: Tricia Gonzales stools/melena/GI bleed  HPI: Tricia Gonzales is a 81 y.o. female with past medical history of paroxysmal atrial fibrillation on Eliquis, sick sinus syndrome s/p pacemaker placement, CHF with EF of 30%, chronic hypoxemia on 2 L oxygen, diabetes, chronic kidney disease admitted to the hospital with fever.  Was found to have pneumonia likely from COVID-19 infection.  She was also complaining of dark stools.  GI is consulted for further evaluation.  Patient seen and examined at bedside.  Patient's family at bedside.  No further bleeding episodes today.  Has seen dark stools in the last few days.  Complaining of lower abdominal discomfort.  Denies any vomiting.  Overall not feeling good for last several days.   EGD by Dr.  Bryan Lemma  in February 2022 showed lower esophageal stricture which was dilated with up to 20 mm balloon.  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   Anxiety    Arthritis    Atrial fibrillation ([place])    Avascular necrosis (HCC) hip left   leg pain also   Chronic back pain    Chronic kidney disease (CKD), stage III (moderate) (HCC)    Depression    Diverticulosis    Early cataracts, bilateral    Esophageal stricture    Gastritis    GAVE (gastric antral vascular ectasia)    GERD (gastroesophageal reflux disease)    Glaucoma    Heart murmur     " some doctors say that I have one some say that I don"t "   Hiatal hernia    History of blood transfusion    "@ least w/1st knee OR"   History of gout    History of kidney stones    Hyperlipidemia    Hypertension    Intestinal obstruction ([place])    Kidney stones    Obesity    Osteoarthritis    Osteoporosis    Pericarditis 2016   Presence of permanent cardiac pacemaker     Sjogren's disease ([place])    Thyroid disease    Type II diabetes mellitus ([place])    Vitamin B12 deficiency     Past Surgical History:  Procedure Laterality Date   ABDOMINAL HYSTERECTOMY     complete   CARDIOVERSION N/A 07/31/2020   Procedure: CARDIOVERSION;  Surgeon: Werner Lean, MD;  Location: [facility];  Service: Cardiovascular;  Laterality: N/A;   CHOLECYSTECTOMY OPEN     COLECTOMY     for rectovaginal fistula   COLONOSCOPY  01/07/2012   Procedure: COLONOSCOPY;  Surgeon: Lafayette Dragon, MD;  Location: [facility];  Service: Endoscopy;  Laterality: N/A;   EP IMPLANTABLE DEVICE N/A 06/18/2015   Procedure: Pacemaker Implant;  Surgeon: Evans Lance, MD;  Location: [facility];  Service: Cardiovascular;  Laterality: N/A;   ESOPHAGEAL DILATION  08/17/2020   Procedure: ESOPHAGEAL DILATION;  Surgeon: Lavena Bullion, DO;  Location: [facility];  Service: Gastroenterology;;   ESOPHAGOGASTRODUODENOSCOPY N/A 11/24/2018   Procedure: ESOPHAGOGASTRODUODENOSCOPY (EGD);  Surgeon: Yetta Flock, MD;  Location: [facility];  Service: Gastroenterology;  Laterality: N/A;   ESOPHAGOGASTRODUODENOSCOPY (EGD) WITH ESOPHAGEAL DILATION     ESOPHAGOGASTRODUODENOSCOPY (EGD) WITH PROPOFOL N/A 08/12/2015   Procedure: ESOPHAGOGASTRODUODENOSCOPY (EGD) WITH PROPOFOL;  Surgeon: Manus Gunning, MD;  Location: [facility];  Service: Gastroenterology;  Laterality: N/A;   ESOPHAGOGASTRODUODENOSCOPY (EGD) WITH PROPOFOL N/A 08/17/2020   Procedure: ESOPHAGOGASTRODUODENOSCOPY (EGD) WITH PROPOFOL;  Surgeon: Lavena Bullion, DO;  Location: [facility];  Service: Gastroenterology;  Laterality: N/A;   EYE SURGERY Bilateral    cataracts   HOT HEMOSTASIS N/A 11/24/2018   Procedure: HOT HEMOSTASIS (ARGON PLASMA COAGULATION/BICAP);  Surgeon: Yetta Flock, MD;  Location: [facility];  Service: Gastroenterology;  Laterality: N/A;   HOT HEMOSTASIS N/A 08/17/2020   Procedure: HOT HEMOSTASIS  (ARGON PLASMA COAGULATION/BICAP);  Surgeon: Lavena Bullion, DO;  Location: [facility] ENDOSCOPY;  Service: Gastroenterology;  Laterality: N/A;   I & D EXTREMITY Right 08/06/2016   Procedure: DEBRIDEMENT PIP RIGHT RING FINGER;  Surgeon: Daryll Brod, MD;  [facility]: [facility];  Service: Orthopedics;  Laterality: Right;   INSERT / REPLACE / REMOVE PACEMAKER     JOINT REPLACEMENT     KNEE ARTHROSCOPY Right    MASS EXCISION Right 08/06/2016   Procedure: EXCISION CYST;  Surgeon: Daryll Brod, MD;  Location: [facility];  Service: Orthopedics;  Laterality: Right;   REVISION TOTAL KNEE ARTHROPLASTY Left    TOTAL KNEE ARTHROPLASTY Bilateral     Prior to Admission medications   Medication Sig Start Date End Date Taking? Authorizing Provider  ACCU-CHEK AVIVA PLUS test strip 1 each by Other route daily.  03/15/15   [provider]  acetaminophen (TYLENOL) 650 MG CR tablet Take 1,300 mg by mouth every evening.    [provider]  BD PEN NEEDLE NANO U/F 32G X 4 MM MISC See admin instructions. 12/25/19   [provider]  carvedilol (COREG) 12.5 MG tablet Take 18.75 mg by mouth 2 (two) times daily with a meal.    [provider]  Cholecalciferol (VITAMIN D3) 25 MCG (1000 UT) CAPS Take 1,000 Units by mouth daily.    [provider]  cycloSPORINE (RESTASIS) 0.05 % ophthalmic emulsion Place 1 drop into both eyes 2 (two) times daily as needed (eye dryness). 10/30/20   [provider]  dicyclomine (BENTYL) 10 MG capsule Take 1 capsule (10 mg total) by mouth every 8 (eight) hours as needed for spasms. 10/07/21   Armbruster, Carlota Raspberry, MD  ELIQUIS 2.5 MG TABS tablet TAKE ONE TABLET TWICE DAILY 02/09/22   O'Neal, Cassie Freer, MD  febuxostat (ULORIC) 40 MG tablet Take 1 tablet (40 mg total) by mouth every Monday, Wednesday, and Friday. 05/12/19   Granville Lewis C, PA-C  ferrous sulfate 325 (65 FE) MG EC tablet Take 325 mg by mouth daily with breakfast.    [provider]   gabapentin (NEURONTIN) 400 MG capsule Take 400 mg by mouth 2 (two) times daily.    [provider]  hydrALAZINE (APRESOLINE) 25 MG tablet Take 1 tablet (25 mg total) by mouth every 8 (eight) hours. 05/28/22   Duke, Tami Lin, PA  isosorbide mononitrate (IMDUR) 30 MG 24 hr tablet Take 1 tablet (30 mg total) by mouth daily. 05/29/22   [facility]uke, Tami Lin, PA  levothyroxine (SYNTHROID) 75 MCG tablet Take 75 mcg by mouth at bedtime. 04/14/21   [provider]  lidocaine (LIDODERM) 5 % Place 1-3 patches onto the skin See admin instructions. Apply 1-3 patches transdermally every 24 hours as needed for pain and remove & discard patches within 12 hours or as directed by MD    [provider]  metolazone (ZAROXOLYN) 5 MG tablet Take 1 tablet (5 mg total) by mouth every Monday.  06/01/22   Duke, Tami Lin, PA  montelukast (SINGULAIR) 10 MG tablet Take 1 tablet (10 mg total) by mouth at bedtime. 05/12/19   Granville Lewis C, PA-C  nystatin ([place]) powder APPLY TOPICALLY TO LEFT ABDOMINAL FOLD AND GROIN TWICE A DAY Patient taking differently: Apply 1 Application topically daily as needed (Fungal infection). APPLY TOPICALLY TO LEFT ABDOMINAL FOLD AND GROIN 05/12/19   Granville Lewis C, PA-C  ondansetron (ZOFRAN-ODT) 4 MG disintegrating tablet Take 1 tablet (4 mg total) by mouth every 8 (eight) hours as needed for nausea or vomiting. 11/11/21   Armbruster, Carlota Raspberry, MD  pantoprazole (PROTONIX) 40 MG tablet Take 40 mg by mouth daily.    [provider]  pentoxifylline (TRENTAL) 400 MG CR tablet Take 400 mg by mouth daily. With a meal    [provider]  rOPINIRole (REQUIP) 0.5 MG tablet Take 0.5 mg by mouth at bedtime. 02/23/20   [provider]  rosuvastatin (CRESTOR) 5 MG tablet Take 1 tablet (5 mg total) by mouth daily. 05/12/19   Granville Lewis C, PA-C  sennosides-docusate sodium (SENOKOT-S) 8.6-50 MG tablet Take 1 tablet by mouth daily.    [provider]  SF 5000 PLUS 1.1 % CREA dental cream Take by mouth as directed. 12/13/21   [provider]  SIMBRINZA 1-0.2 % SUSP Place 1 drop into the right eye 2 (two) times daily. 03/24/22   [provider]  torsemide (DEMADEX) 20 MG tablet Take 2 tablets (40 mg total) by mouth daily. 03/27/22   Almyra Deforest, PA  traMADol (ULTRAM) 50 MG tablet Take 50 mg by mouth 2 (two) times daily. 02/13/20   [provider]  TRESIBA FLEXTOUCH 100 UNIT/ML FlexTouch Pen Inject 8-10 Units into the skin every evening. 02/21/20   [provider]    Scheduled Meds:  carvedilol  12.5 mg Oral BID WC   [START ON 06/08/2022] febuxostat  40 mg Oral Q M,W,F   insulin aspart  0-9 Units Subcutaneous Q6H   levothyroxine  75 mcg Oral QHS   montelukast  10 mg Oral QHS   [START ON 06/09/2022] pantoprazole  40 mg Intravenous Q12H   rOPINIRole  0.5 mg Oral QHS   rosuvastatin  5 mg Oral Daily   Continuous Infusions:  azithromycin     cefTRIAXone (ROCEPHIN)  IV     pantoprazole 8 mg/hr (06/06/22 1048)   remdesivir 200 mg in sodium chloride 0.9% 250 mL IVPB 200 mg (06/06/22 1214)   Followed by   Derrill Memo ON 06/07/2022] remdesivir 100 mg in sodium chloride 0.9 % 100 mL IVPB     PRN Meds:.acetaminophen **OR** acetaminophen, fentaNYL (SUBLIMAZE) injection, naLOXone (NARCAN)  injection, ondansetron (ZOFRAN) IV  Allergies as of 06/05/2022 - Review Complete 06/05/2022  Allergen Reaction Noted   Norvasc [amlodipine besylate] Shortness Of Breath 11/22/2007   Other Rash and Other (See Comments) 08/16/2020   Amlodipine besylate  06/02/2021   Avapro [irbesartan] Other (See Comments) 09/22/2016   Glucophage [metformin hydrochloride] Other (See Comments) 09/15/2011   Lisinopril  09/15/2011   Losartan Other (See Comments) 09/15/2011   Lumigan [bimatoprost] Other (See Comments) 05/22/2022   Morphine and related Other (See Comments) 09/15/2011   Simvastatin Other (See Comments) 93/71/6967   Trulicity  [dulaglutide] Other (See Comments) 03/22/2015    Family History  Problem Relation Age of Onset   Diabetes Mother    Hypertension Sister    Diabetes Brother    Colon cancer Neg Hx    Heart attack  Neg Hx    Stroke Neg Hx    Colon polyps Neg Hx    Esophageal cancer Neg Hx    Rectal cancer Neg Hx    Stomach cancer Neg Hx     Social History   Socioeconomic History   Marital status: Divorced    Spouse name: Not on file   Number of children: 2   Years of education: Not on file   Highest education level: High school graduate  Occupational History   Occupation: retired  Tobacco Use   Smoking status: Former    Packs/day: 2.00    Years: 3.00    Total pack years: 6.00    Types: Cigarettes    Quit date: 06/23/1983    Years since quitting: 38.9    Passive exposure: Never   Smokeless tobacco: Never  Vaping Use   Vaping Use: Never used  Substance and Sexual Activity   Alcohol use: Yes    Alcohol/week: 2.0 standard drinks of alcohol    Types: 2 Glasses of wine per week    Comment: occ   Drug use: No   Sexual activity: Not Currently  Other Topics Concern   Not on file  Social History Narrative   Not on file   Social Determinants of Health   Financial Resource Strain: Low Risk  (05/22/2022)   Overall Financial Resource Strain (CARDIA)    Difficulty of Paying Living Expenses: Not hard at all  Food Insecurity: No Food Insecurity (05/21/2022)   Hunger Vital Sign    Worried About Running Out of Food in the Last Year: Never true    Ran Out of Food in the Last Year: Never true  Transportation Needs: No Transportation Needs (05/22/2022)   PRAPARE - Hydrologist (Medical): No    Lack of Transportation (Non-Medical): No  Physical Activity: Not on file  Stress: Not on file  Social Connections: Not on file  Intimate Partner Violence: Not At Risk (05/21/2022)   Humiliation, Afraid, Rape, and Kick questionnaire    Fear of Current or Ex-Partner: No     Emotionally Abused: No    Physically Abused: No    Sexually Abused: No    Review of Systems: All negative except as stated above in HPI.  Physical Exam: Vital signs: Vitals:   06/06/22 1130 06/06/22 1157  BP: (!) 143/59   Pulse: 75   Resp: 18   Temp:  97.9 F (36.6 C)  SpO2: 100%      General:   Elderly patient, not in acute distress, oxygen by nasal cannula Lungs: Decreased breath sound bilaterally, anterior exam only Heart:  Regular rate and rhythm; no murmurs, clicks, rubs,  or gallops. Abdomen: Soft, nontender, nondistended, bowel sounds present, no peritoneal signs Rectal:  Deferred  GI:  Lab Results: Recent Labs    06/05/22 2018 06/06/22 0139 06/06/22 0845 06/06/22 1030  WBC 8.7 8.7  --  10.0  HGB 6.9* 7.5* 7.0* 9.2*  HCT 21.8* 23.4* 20.6* 26.5*  PLT 209 204  --  229   BMET Recent Labs    06/05/22 2018 06/06/22 0139 06/06/22 0657  NA 137 138  --   K 3.1* 3.4* 3.4*  CL 90* 91*  --   CO2 32 32  --   GLUCOSE 160* 172*  --   BUN 161* 160*  --   CREATININE 4.10* 4.00*  --   CALCIUM 8.5* 8.5*  --    LFT Recent Labs  06/06/22 0139  PROT 5.2*  ALBUMIN 2.5*  AST 24  ALT 12  ALKPHOS 63  BILITOT 1.5*   PT/INR Recent Labs    06/05/22 2018  LABPROT 22.9*  INR 2.1*     Studies/Results: DG Chest Port 1 View  Result Date: 06/05/2022 CLINICAL DATA:  Cough and rectal bleeding EXAM: PORTABLE CHEST 1 VIEW COMPARISON:  Radiographs 05/22/2022 FINDINGS: Similar cardiomegaly. Aortic atherosclerotic calcification. Left chest wall dual-chamber pacemaker. Similar chronic bronchitic changes. Pulmonary vascular congestion. Left-greater-than-right basilar atelectasis. Question small left pleural effusion. No pneumothorax. No definite acute osseous abnormality. IMPRESSION: Retrocardiac atelectasis/consolidation. Pneumonia is difficult to exclude. Cardiomegaly and pulmonary vascular congestion. Electronically Signed   By: Placido Sou M.D.   On: 06/05/2022  20:07    Impression/Plan: -Anemia with dark stools.  Patient was on Eliquis with last dose yesterday.  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  Hemoglobin was 6.9 on admission.  Hemoglobin improved with blood transfusion. -Paroxysmal atrial fibrillation.  Eliquis on hold -COVID-19 pneumonia -CHF as well as chronic respiratory failure on home oxygen -Chronic kidney disease   Recommendations -------------------------- -Patient with multiple comorbidities with chronic respiratory failure, CHF and now admitted with COVID-19 pneumonia. -Continue supportive care with IV twice daily PPI for now -If ongoing drop in hemoglobin, consider endoscopic evaluation once stable from cardiac and respiratory standpoint. -Discussed with family at bedside.  Discussed with hospital team over the phone. -GI will follow    LOS: 1 day   Otis Brace  MD, FACP 06/06/2022, 12:28 PM  Contact #  [phone]

## 2022-06-06 NOTE — ED Notes (Addendum)
Hospitalist informed about patient's heart rhythm. No new orders at this time. Hospitalist at bedside.

## 2022-06-06 NOTE — Progress Notes (Signed)
Progress Note  Patient Name: Tricia Gonzales Date of Encounter: 06/06/2022  Primary Cardiologist: Evalina Field, MD   Subjective   Overnight found to have COVID-19 and Acute GI bleed. Patient notes no SOB and no further bleeding.  Daughter is in the room and has questions about CS lead  Inpatient Medications    Scheduled Meds:  carvedilol  12.5 mg Oral BID WC   [START ON 06/08/2022] febuxostat  40 mg Oral Q M,W,F   insulin aspart  0-9 Units Subcutaneous Q6H   levothyroxine  75 mcg Oral QHS   montelukast  10 mg Oral QHS   [START ON 06/09/2022] pantoprazole  40 mg Intravenous Q12H   rOPINIRole  0.5 mg Oral QHS   rosuvastatin  5 mg Oral Daily   Continuous Infusions:  azithromycin     cefTRIAXone (ROCEPHIN)  IV     pantoprazole 8 mg/hr (06/06/22 1048)   remdesivir 200 mg in sodium chloride 0.9% 250 mL IVPB     Followed by   Derrill Memo ON 06/07/2022] remdesivir 100 mg in sodium chloride 0.9 % 100 mL IVPB     PRN Meds: acetaminophen **OR** acetaminophen, fentaNYL (SUBLIMAZE) injection, naLOXone (NARCAN)  injection, ondansetron (ZOFRAN) IV   Vital Signs    Vitals:   06/06/22 0845 06/06/22 0930 06/06/22 1000 06/06/22 1045  BP: (!) 133/59 (!) 130/48 (!) 141/70 (!) 144/66  Pulse: 61 (!) 59 62 (!) 117  Resp: _0 Temp:      TempSrc:      SpO2: 98% 98% 97% 96%  Weight:      Height:        Intake/Output Summary (Last 24 hours) at 06/06/2022 1110 Last data filed at 06/06/2022 1048 Gross per 24 hour  Intake 1349.67 ml  Output --  Net 1349.67 ml   Filed Weights   06/05/22 1856  Weight: 81.6 kg    Telemetry    AF rate controlled - Personally Reviewed  Physical Exam   Gen: no distress, Neck: JVD with prominent V wave Cardiac: No Rubs or Gallops, systolic murmur, IRIR Respiratory: decreased breath sounds, normal effort, normal  respiratory rate GI: Soft, nontender, non-distended  MS: No  edema;  moves all extremities Integument: Skin feels warm Neuro:   At time of evaluation, alert and oriented to person/place/time/situation  Psych: Normal affect, patient feels ok   Labs    Chemistry Recent Labs  Lab 06/02/22 1247 06/05/22 2018 06/06/22 0139 06/06/22 0657  NA 136 137 138  --   K 3.5 3.1* 3.4* 3.4*  CL 90* 90* 91*  --   CO2 33* 32 32  --   GLUCOSE 165* 160* 172*  --   BUN 115* 161* 160*  --   CREATININE 3.32* 4.10* 4.00*  --   CALCIUM 8.9 8.5* 8.5*  --   PROT 5.9* 5.5* 5.2*  --   ALBUMIN 3.1* 2.6* 2.5*  --   AST _1 --   ALT _2 --   ALKPHOS 73 66 63  --   BILITOT 1.3* 1.3* 1.5*  --   GFRNONAA 13* 10* 11*  --   ANIONGAP _3 --      Hematology Recent Labs  Lab 06/05/22 2018 06/06/22 0139 06/06/22 0845 06/06/22 1030  WBC 8.7 8.7  --  10.0  RBC 2.16* 2.37*  2.37*  --  2.80*  HGB 6.9* 7.5* 7.0* 9.2*  HCT 21.8* 23.4* 20.6* 26.5*  MCV 100.9* 98.7  --  94.6  MCH 31.9 31.6  --  32.9  MCHC 31.7 32.1  --  34.7  RDW 16.8* 17.3*  --  18.7*  PLT 209 204  --  229    Cardiac EnzymesNo results for input(s): "TROPONINI" in the last 168 hours. No results for input(s): "TROPIPOC" in the last 168 hours.   BNP Recent Labs  Lab 06/02/22 1247  BNP 2,414.4*     DDimer  Recent Labs  Lab 06/06/22 0657  DDIMER 0.83*     Radiology    DG Chest Port 1 View  Result Date: 06/05/2022 CLINICAL DATA:  Cough and rectal bleeding EXAM: PORTABLE CHEST 1 VIEW COMPARISON:  Radiographs 05/22/2022 FINDINGS: Similar cardiomegaly. Aortic atherosclerotic calcification. Left chest wall dual-chamber pacemaker. Similar chronic bronchitic changes. Pulmonary vascular congestion. Left-greater-than-right basilar atelectasis. Question small left pleural effusion. No pneumothorax. No definite acute osseous abnormality. IMPRESSION: Retrocardiac atelectasis/consolidation. Pneumonia is difficult to exclude. Cardiomegaly and pulmonary vascular congestion. Electronically Signed   By: Placido Sou M.D.   On: 06/05/2022 20:07     Cardiac Studies   Cardiac Studies & Procedures       ECHOCARDIOGRAM  ECHOCARDIOGRAM LIMITED 05/11/2022  Narrative ECHOCARDIOGRAM LIMITED REPORT    Patient Name:   BRITLEE SKOLNIK Aerts Date of Exam: 05/11/2022 Medical Rec #:  [id number]     Height:       58.0 in Accession #:    [id number]    Weight:       191.0 lb Date of Birth:  Jul 07, 1940     BSA:          1.786 m Patient Age:    27 years      BP:           152/98 mmHg Patient Gender: F             HR:           76 bpm. Exam Location:  [address]  Procedure: Limited Echo, Cardiac Doppler and Limited Color Doppler  Indications:    I51.7 Enlarged RV. Evaluate MR.  History:        Patient has prior history of Echocardiogram examinations, most recent 03/20/2022. CHF, Pacemaker, Arrythmias:Bradycardia, Sinus node dysfunction, Atrial Flutter and Atrial Fibrillation; Risk Factors:Hypertension, Diabetes, Dyslipidemia and Former Smoker. Pericarditis. Anemia. Obesity. Sjogren's disease. CKD stage 3.  Sonographer:    Basilia Jumbo BS, RDCS Referring Phys: [phone] [place]   1. Left ventricular ejection fraction, by estimation, is 30 to 35%. The left ventricle demonstrates global hypokinesis. There is mild concentric left ventricular hypertrophy. Left ventricular diastolic function could not be evaluated. 2. Right ventricular systolic function is moderately reduced. The right ventricular size is moderately enlarged. There is severely elevated pulmonary artery systolic pressure. 3. The mitral valve is normal in structure. Moderate to severe mitral valve regurgitation. No evidence of mitral stenosis. Moderate mitral annular calcification. 4. Tricuspid valve regurgitation is severe. 5. The aortic valve is normal in structure. There is mild calcification of the aortic valve. Aortic valve regurgitation is not visualized. Aortic valve sclerosis is present, with no evidence of aortic valve stenosis. 6. The inferior vena cava is  normal in size with greater than 50% respiratory variability, suggesting right atrial pressure of 3 mmHg.  Comparison(s): 03/20/22 EF 25-30%. PA pressure 71mHg. Moderate-severe MR. RV moderately enlarged.  FINDINGS Left Ventricle: Left ventricular ejection fraction, by estimation, is 30 to 35%. The left ventricle demonstrates global hypokinesis. The left ventricular  internal cavity size was normal in size. There is mild concentric left ventricular hypertrophy. Left ventricular diastolic function could not be evaluated. Left ventricular diastolic function could not be evaluated due to mitral annular calcification (moderate or greater).  Right Ventricle: The right ventricular size is moderately enlarged. No increase in right ventricular wall thickness. Right ventricular systolic function is moderately reduced. There is severely elevated pulmonary artery systolic pressure. The tricuspid regurgitant velocity is 3.44 m/s, and with an assumed right atrial pressure of 15 mmHg, the estimated right ventricular systolic pressure is 12.5 mmHg.  Left Atrium: Left atrial size was normal in size.  Right Atrium: Right atrial size was normal in size.  Pericardium: There is no evidence of pericardial effusion.  Mitral Valve: The mitral valve is normal in structure. Moderate mitral annular calcification. Moderate to severe mitral valve regurgitation, with posteriorly-directed jet. No evidence of mitral valve stenosis.  Tricuspid Valve: The tricuspid valve is normal in structure. Tricuspid valve regurgitation is severe. No evidence of tricuspid stenosis.  Aortic Valve: The aortic valve is normal in structure. There is mild calcification of the aortic valve. Aortic valve regurgitation is not visualized. Aortic valve sclerosis is present, with no evidence of aortic valve stenosis.  Pulmonic Valve: The pulmonic valve was normal in structure. Pulmonic valve regurgitation is not visualized. No evidence of pulmonic  stenosis.  Aorta: The aortic root is normal in size and structure.  Venous: The inferior vena cava is normal in size with greater than 50% respiratory variability, suggesting right atrial pressure of 3 mmHg.  IAS/Shunts: No atrial level shunt detected by color flow Doppler.  LEFT VENTRICLE PLAX 2D LVIDd:         5.50 cm   Diastology LVIDs:         4.40 cm   LV e' medial:    6.32 cm/s LV PW:         1.10 cm   LV E/e' medial:  15.8 LV IVS:        1.00 cm   LV e' lateral:   10.50 cm/s LVOT diam:     2.40 cm   LV E/e' lateral: 9.5 LVOT Area:     4.52 cm   RIGHT VENTRICLE            IVC RV Basal diam:  4.30 cm    IVC diam: 2.70 cm RV Mid diam:    3.60 cm RV S prime:     4.95 cm/s TAPSE (M-mode): 1.2 cm RVSP:           62.3 mmHg  LEFT ATRIUM         Index       RIGHT ATRIUM            Index LA diam:    5.70 cm 3.19 cm/m  RA Pressure: 15.00 mmHg RA Area:     22.80 cm RA Volume:   72.90 ml   40.82 ml/m  AORTA Ao Root diam: 3.50 cm  MITRAL VALVE                  TRICUSPID VALVE TR Peak grad:   47.3 mmHg MV Decel Time: 165 msec       TR Vmax:        344.00 cm/s MR Peak grad:    131.8 mmHg   Estimated RAP:  15.00 mmHg MR Mean grad:    82.5 mmHg    RVSP:  62.3 mmHg MR Vmax:         574.00 cm/s MR Vmean:        428.0 cm/s   SHUNTS MR PISA:         4.02 cm     Systemic Diam: 2.40 cm MR PISA Eff ROA: 22 mm MR PISA Radius:  0.80 cm MV E velocity: 99.70 cm/s MV A velocity: 49.30 cm/s MV E/A ratio:  2.02  Kardie Tobb DO Electronically signed by Berniece Salines DO Signature Date/Time: 05/11/2022/2:44:00 PM    Final              Patient Profile     81 y.o. 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  -Hold diuretics as above -Avoid nephrotoxic medications   #COVID PNA -Defer management to hospital medicine  Will chart and tele check 06/07/22 and see 06/08/22; If questions on 06/07/22 please send [place] or page and will see and discuss   For questions or updates, please contact [facility] Please consult [email or web address] for contact info under Cardiology/STEMI.      Rudean Haskell, MD [place], [address] [place], [place] [address] [phone]  11:10 AM

## 2022-06-06 NOTE — ED Notes (Signed)
Patient assisted to bedside commode, standby. Bed linen changed and peri care performed.

## 2022-06-06 NOTE — ED Notes (Signed)
MD Howerter aware of patient having multiple runs of Vtach.

## 2022-06-06 NOTE — ED Notes (Signed)
MD at bedside.

## 2022-06-06 NOTE — Progress Notes (Addendum)
PROGRESS NOTE        PATIENT DETAILS Name: Tricia Gonzales Age: 81 y.o. Sex: female Date of Birth: [birth year] Admit Date: 06/05/2022 Admitting Physician Rhetta Mura, DO OVF:IEPPI, Hal Hope, MD  Brief Summary: Patient is a 81 y.o.  female with history of chronic HFrEF, PAF on Eliquis, sick sinus syndrome-s/p PPM placement, chronic hypoxic respiratory failure on home O2, DM-2, CKD 4 who presented with subjective fever-was found to have PNA/COVID-19 infection, melena/GI bleed with acute blood loss anemia and AKI on CKD 4.  Significant events: 11/30-12/07>> hospitalization- acute on chronic HFrEF 12/15>> hospitalization for COVID infection/PNA/possible GI bleed/AKI on CKD 4.  Significant studies: 11/20>> echo: EF 95-18%, RV systolic function moderately reduced. 12/15>> CXR: Retrocardiac consolidation/atelectasis.  Significant microbiology data: 12/15>> COVID PCR: Positive (CT value 26.1) 12/15>> influenza A/B/RSV: Negative  Procedures: None  Consults: GI Cardiology  Subjective: Lying comfortably in bed-denies any chest pain or shortness of breath.  Stable on 2 L of oxygen.  No melanotic stools since hospitalization.  Objective: Vitals: Blood pressure (!) 133/59, pulse 61, temperature 98.4 F (36.9 C), temperature source Oral, resp. rate 16, height _0  (1.473 m), weight 81.6 kg, SpO2 98 %.   Exam: Gen Exam:Alert awake-not in any distress HEENT:atraumatic, normocephalic Chest: B/L clear to auscultation anteriorly CVS:S1S2 regular Abdomen:soft non tender, non distended Extremities:+edema Neurology: Non focal Skin: no rash  Pertinent Labs/Radiology:    Latest Ref Rng & Units 06/06/2022    8:45 AM 06/06/2022    1:39 AM 06/05/2022    8:18 PM  CBC  WBC 4.0 - 10.5 K/uL  8.7  8.7   Hemoglobin 12.0 - 15.0 g/dL 7.0  7.5  6.9   Hematocrit 36.0 - 46.0 % 20.6  23.4  21.8   Platelets 150 - 400 K/uL  204  209     Lab Results  Component Value  Date   NA 138 06/06/2022   K 3.4 (L) 06/06/2022   CL 91 (L) 06/06/2022   CO2 32 06/06/2022     Assessment/Plan: Upper GI bleed with acute blood loss anemia No further melanotic stools since presentation (on iron supplementation at home) S/p 2 units of PRBC-however hemoglobin still 7.0 (6.9 on presentation).  Suspect this may be an error as no further melanotic stools.  Will repeat CBC-and if still in the similar range-given her extensive cardiac history will need another unit of PRBC. Continue PPI-await [facility] input.  AKI on CKD 4 AKI likely hemodynamically mediated due to GI bleed Supportive care Avoid nephrotoxic agents Watch closely-if worsens significantly-will obtain nephrology evaluation.  PNA Likely bacterial-perhaps aspiration Obtain SLP eval Although COVID-positive-procalcitonin is elevated-CXR more consistent with bacterial PNA-hence we will continue with antibiotics.  COVID-19 infection Remdesivir x 3 days Procalcitonin elevated Awaiting CRP Stable on 2 L of oxygen-this is her home regimen Hold off on steroids for now  Chronic HFrEF Volume status relatively stable Given ongoing GI bleed/AKI-diuretics on hold  PAF Rate controlled with Coreg Eliquis on hold Monitor in telemetry.  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   Weight as of this encounter: 81.6 kg.   Code status:   Code  Status: DNR reconfirmed with daughter/patient this morning.  DVT Prophylaxis: SCDs Start: 06/05/22 2151   Family Communication: Daughter-[name]-[phone] updated over the phone on 12/16   Disposition Plan: Status is: Inpatient Remains inpatient appropriate because: Upper GI bleed-acute blood loss anemia-PNA-COVID infection-AKI-not yet stable for discharge.   Planned Discharge Destination:Home health   Diet: Diet Order             Diet NPO time specified  Diet effective midnight                     Antimicrobial agents: Anti-infectives (From admission, onward)    Start     Dose/Rate Route Frequency Ordered Stop   06/06/22 1700  cefTRIAXone (ROCEPHIN) 1 g in sodium chloride 0.9 % 100 mL IVPB        1 g 200 mL/hr over 30 Minutes Intravenous Every 24 hours 06/05/22 2158     06/06/22 1700  azithromycin (ZITHROMAX) 500 mg in sodium chloride 0.9 % 250 mL IVPB        500 mg 250 mL/hr over 60 Minutes Intravenous Every 24 hours 06/05/22 2158     06/05/22 2115  cefTRIAXone (ROCEPHIN) 1 g in sodium chloride 0.9 % 100 mL IVPB        1 g 200 mL/hr over 30 Minutes Intravenous  Once 06/05/22 2108 06/05/22 2306   06/05/22 2115  azithromycin (ZITHROMAX) 500 mg in sodium chloride 0.9 % 250 mL IVPB        500 mg 250 mL/hr over 60 Minutes Intravenous  Once 06/05/22 2108 06/06/22 0109        MEDICATIONS: Scheduled Meds:  insulin aspart  0-9 Units Subcutaneous Q6H   [START ON 06/09/2022] pantoprazole  40 mg Intravenous Q12H   Continuous Infusions:  azithromycin     cefTRIAXone (ROCEPHIN)  IV     pantoprazole 8 mg/hr (06/06/22 0838)   PRN Meds:.acetaminophen **OR** acetaminophen, fentaNYL (SUBLIMAZE) injection, naLOXone (NARCAN)  injection, ondansetron (ZOFRAN) IV   I have personally reviewed following labs and imaging studies  LABORATORY DATA: CBC: Recent Labs  Lab 06/05/22 2018 06/06/22 0139 06/06/22 0845  WBC 8.7 8.7  --   NEUTROABS  --  8.0*  --   HGB 6.9*  7.5* 7.0*  HCT 21.8* 23.4* 20.6*  MCV 100.9* 98.7  --   PLT 209 204  --     Basic Metabolic Panel: Recent Labs  Lab 06/02/22 1247 06/05/22 2018 06/06/22 0139 06/06/22 0657  NA 136 137 138  --   K 3.5 3.1* 3.4* 3.4*  CL 90* 90* 91*  --   CO2 33* 32 32  --   GLUCOSE 165* 160* 172*  --   BUN 115* 161* 160*  --   CREATININE 3.32* 4.10* 4.00*  --   CALCIUM 8.9 8.5* 8.5*  --   MG  --   --  2.2  --     GFR: Estimated Creatinine Clearance: 10.1 mL/min (A) (by C-G formula based on SCr of 4 mg/dL (H)).  Liver Function Tests: Recent Labs  Lab 06/02/22 1247 06/05/22 2018 06/06/22 0139  AST _1 ALT _2 ALKPHOS 73 66 63  BILITOT 1.3* 1.3* 1.5*  PROT 5.9* 5.5* 5.2*  ALBUMIN 3.1* 2.6* 2.5*   No results for input(s): "LIPASE", "AMYLASE" in the last 168 hours. No results for input(s): "AMMONIA" in the last 168 hours.  Coagulation Profile: Recent Labs  Lab 06/05/22 2018  INR 2.1*    Cardiac Enzymes: No results for input(s): "CKTOTAL", "CKMB", "CKMBINDEX", "TROPONINI" in the last 168 hours.  BNP (last 3 results) Recent Labs    03/20/22 1028 04/02/22 1434  PROBNP 65,249* 02,542*    Lipid Profile: No results for input(s): "CHOL", "HDL", "LDLCALC", "TRIG", "CHOLHDL", "LDLDIRECT" in the last 72 hours.  Thyroid Function Tests: No results for input(s): "TSH", "T4TOTAL", "FREET4", "T3FREE", "THYROIDAB" in the last 72 hours.  Anemia Panel: Recent Labs    06/06/22 0139  FOLATE 17.1  RETICCTPCT 1.8    Urine analysis:    Component Value Date/Time   COLORURINE YELLOW 06/05/2022 0110   APPEARANCEUR HAZY (A) 06/05/2022 0110   LABSPEC 1.013 06/05/2022 0110   PHURINE 5.0 06/05/2022 0110   GLUCOSEU NEGATIVE 06/05/2022 0110   HGBUR MODERATE (A) 06/05/2022 0110   BILIRUBINUR NEGATIVE 06/05/2022 0110   KETONESUR NEGATIVE 06/05/2022 0110   PROTEINUR 30 (A) 06/05/2022 0110   UROBILINOGEN 0.2 03/11/2010 0916   NITRITE NEGATIVE 06/05/2022 0110   LEUKOCYTESUR  NEGATIVE 06/05/2022 0110    Sepsis Labs: Lactic Acid, Venous    Component Value Date/Time   LATICACIDVEN 1.0 08/07/2015 0000    MICROBIOLOGY: Recent Results (from the past 240 hour(s))  Resp panel by RT-PCR (RSV, Flu A&B, Covid) Anterior Nasal Swab     Status: Abnormal   Collection Time: 06/05/22  7:43 PM   Specimen: Anterior Nasal Swab  Result Value Ref Range Status   SARS Coronavirus 2 by RT PCR POSITIVE (A) NEGATIVE Final    Comment: (NOTE) SARS-CoV-2 target nucleic acids are DETECTED.  The SARS-CoV-2 RNA is generally detectable in upper respiratory specimens during the acute phase of infection. Positive results are indicative of the presence of the identified virus, but do not rule out bacterial infection or co-infection with other pathogens not detected by the test. Clinical correlation with patient history and other diagnostic information is necessary to determine patient infection status. The expected result is Negative.  Fact Sheet for Patients: EntrepreneurPulse.com.au  Fact Sheet for Healthcare Providers: IncredibleEmployment.be  This test is not yet approved or cleared by the [place] FDA and  has been authorized for detection and/or diagnosis of SARS-CoV-2 by FDA under an Emergency Use Authorization (EUA).  This EUA will remain in effect (meaning this test can be used) for the duration of  the COVID-19 declaration under Section 564(b)(1) of the A ct, 21 U.S.C. section 360bbb-3(b)(1), unless the authorization is terminated or revoked sooner.     Influenza A by PCR NEGATIVE NEGATIVE Final   Influenza B by PCR NEGATIVE NEGATIVE Final    Comment: (NOTE) The Xpert Xpress SARS-CoV-2/FLU/RSV plus assay is intended as an aid in the diagnosis of influenza from Nasopharyngeal swab specimens and should not be used as a sole basis for treatment. Nasal washings and aspirates are unacceptable for Xpert Xpress  SARS-CoV-2/FLU/RSV testing.  Fact Sheet for Patients: EntrepreneurPulse.com.au  Fact Sheet for Healthcare Providers: IncredibleEmployment.be  This test is not yet approved or cleared by the [place] FDA and has been authorized for detection and/or diagnosis of SARS-CoV-2 by FDA under an Emergency Use Authorization (EUA). This EUA will remain in effect (meaning this test can be used) for the duration of the COVID-19 declaration under Section 564(b)(1)  of the Act, 21 U.S.C. section 360bbb-3(b)(1), unless the authorization is terminated or revoked.     Resp Syncytial Virus by PCR NEGATIVE NEGATIVE Final    Comment: (NOTE) Fact Sheet for Patients: EntrepreneurPulse.com.au  Fact Sheet for Healthcare Providers: IncredibleEmployment.be  This test is not yet approved or cleared by the [place] FDA and has been authorized for detection and/or diagnosis of SARS-CoV-2 by FDA under an Emergency Use Authorization (EUA). This EUA will remain in effect (meaning this test can be used) for the duration of the COVID-19 declaration under Section 564(b)(1) of the Act, 21 U.S.C. section 360bbb-3(b)(1), unless the authorization is terminated or revoked.  Performed at [facility], [place] [address] [place], [place] [address]     RADIOLOGY STUDIES/RESULTS: DG Chest Port 1 View  Result Date: 06/05/2022 CLINICAL DATA:  Cough and rectal bleeding EXAM: PORTABLE CHEST 1 VIEW COMPARISON:  Radiographs 05/22/2022 FINDINGS: Similar cardiomegaly. Aortic atherosclerotic calcification. Left chest wall dual-chamber pacemaker. Similar chronic bronchitic changes. Pulmonary vascular congestion. Left-greater-than-right basilar atelectasis. Question small left pleural effusion. No pneumothorax. No definite acute osseous abnormality. IMPRESSION: Retrocardiac atelectasis/consolidation. Pneumonia is difficult to exclude. Cardiomegaly  and pulmonary vascular congestion. Electronically Signed   By: Placido Sou M.D.   On: 06/05/2022 20:07     LOS: 1 day   Oren Binet, MD  [facility]    To contact the attending provider between 7A-7P or the covering provider during after hours 7P-7A, please log into the web site [email or web address] and access using universal [place] password for that web site. If you do not have the password, please call the hospital operator.  06/06/2022, 9:22 AM

## 2022-06-07 ENCOUNTER — Inpatient Hospital Stay (HOSPITAL_COMMUNITY): Payer: Medicare Other

## 2022-06-07 ENCOUNTER — Other Ambulatory Visit (HOSPITAL_COMMUNITY): Payer: Medicare Other

## 2022-06-07 DIAGNOSIS — K922 Gastrointestinal hemorrhage, unspecified: Secondary | ICD-10-CM | POA: Diagnosis not present

## 2022-06-07 LAB — GLUCOSE, CAPILLARY
Glucose-Capillary: 182 mg/dL — ABNORMAL HIGH (ref 70–99)
Glucose-Capillary: 187 mg/dL — ABNORMAL HIGH (ref 70–99)
Glucose-Capillary: 211 mg/dL — ABNORMAL HIGH (ref 70–99)
Glucose-Capillary: 214 mg/dL — ABNORMAL HIGH (ref 70–99)
Glucose-Capillary: 221 mg/dL — ABNORMAL HIGH (ref 70–99)

## 2022-06-07 LAB — RENAL FUNCTION PANEL
Albumin: 2.4 g/dL — ABNORMAL LOW (ref 3.5–5.0)
Anion gap: 14 (ref 5–15)
BUN: 153 mg/dL — ABNORMAL HIGH (ref 8–23)
CO2: 30 mmol/L (ref 22–32)
Calcium: 8.5 mg/dL — ABNORMAL LOW (ref 8.9–10.3)
Chloride: 94 mmol/L — ABNORMAL LOW (ref 98–111)
Creatinine, Ser: 3.83 mg/dL — ABNORMAL HIGH (ref 0.44–1.00)
GFR, Estimated: 11 mL/min — ABNORMAL LOW (ref >60)
Glucose, Bld: 166 mg/dL — ABNORMAL HIGH (ref 70–99)
Phosphorus: 4.5 mg/dL (ref 2.5–4.6)
Potassium: 3.1 mmol/L — ABNORMAL LOW (ref 3.5–5.1)
Sodium: 138 mmol/L (ref 135–145)

## 2022-06-07 LAB — CBC
HCT: 27.8 % — ABNORMAL LOW (ref 36.0–46.0)
Hemoglobin: 9.5 g/dL — ABNORMAL LOW (ref 12.0–15.0)
MCH: 33.3 pg (ref 26.0–34.0)
MCHC: 34.2 g/dL (ref 30.0–36.0)
MCV: 97.5 fL (ref 80.0–100.0)
Platelets: 235 10*3/uL (ref 150–400)
RBC: 2.85 MIL/uL — ABNORMAL LOW (ref 3.87–5.11)
RDW: 19.5 % — ABNORMAL HIGH (ref 11.5–15.5)
WBC: 7.6 10*3/uL (ref 4.0–10.5)
nRBC: 0 % (ref 0.0–0.2)

## 2022-06-07 LAB — C-REACTIVE PROTEIN: CRP: 14.4 mg/dL — ABNORMAL HIGH (ref <1.0)

## 2022-06-07 MED ORDER — POTASSIUM CHLORIDE CRYS ER 20 MEQ PO TBCR
40.0000 meq | EXTENDED_RELEASE_TABLET | Freq: Once | ORAL | Status: AC
  Administered 2022-06-07: 40 meq via ORAL
  Filled 2022-06-07: qty 2

## 2022-06-07 MED ORDER — VITAMIN B-12 1000 MCG PO TABS
1000.0000 ug | ORAL_TABLET | Freq: Every day | ORAL | Status: DC
  Administered 2022-06-10 – 2022-06-19 (×10): 1000 ug via ORAL
  Filled 2022-06-07 (×10): qty 1

## 2022-06-07 MED ORDER — CYANOCOBALAMIN 1000 MCG/ML IJ SOLN
1000.0000 ug | Freq: Every day | INTRAMUSCULAR | Status: AC
  Administered 2022-06-07 – 2022-06-09 (×3): 1000 ug via SUBCUTANEOUS
  Filled 2022-06-07 (×3): qty 1

## 2022-06-07 MED ORDER — POTASSIUM CHLORIDE CRYS ER 20 MEQ PO TBCR
40.0000 meq | EXTENDED_RELEASE_TABLET | Freq: Two times a day (BID) | ORAL | Status: DC
  Administered 2022-06-07: 40 meq via ORAL
  Filled 2022-06-07: qty 2

## 2022-06-07 MED ORDER — POTASSIUM CHLORIDE CRYS ER 20 MEQ PO TBCR: 60.0000 meq | EXTENDED_RELEASE_TABLET | Freq: Two times a day (BID) | ORAL | Status: DC

## 2022-06-07 NOTE — Progress Notes (Signed)
[facility] Gastroenterology Progress Note  Tricia Gonzales 81 y.o. [birth year]  CC:   Dark stools, GI bleed  Subjective: Patient seen and examined at bedside.  Denies any bowel movement today.  Mild abdominal discomfort but denies any nausea and vomiting.  Continues to have weakness.  ROS : Positive for weakness.  Positive for shortness of breath.   Objective: Vital signs in last 24 hours: Vitals:   06/07/22 0800 06/07/22 1100  BP: (!) 143/59 (!) 147/78  Pulse: 67   Resp: 16   Temp: 98 F (36.7 C) 98.6 F (37 C)  SpO2: 97%     Physical Exam: Elderly patient, not in acute distress.  Oxygen by nasal cannula.  Abdomen mildly distended but otherwise soft, left lower quadrant discomfort on palpation, bowel sounds present, no peritoneal signs Lab Results: Recent Labs    06/06/22 0139 06/06/22 0657 06/07/22 0340  NA 138  --  138  K 3.4* 3.4* 3.1*  CL 91*  --  94*  CO2 32  --  30  GLUCOSE 172*  --  166*  BUN 160*  --  153*  CREATININE 4.00*  --  3.83*  CALCIUM 8.5*  --  8.5*  MG 2.2  --   --   PHOS  --   --  4.5   Recent Labs    06/05/22 2018 06/06/22 0139 06/07/22 0340  AST 24 24  --   ALT 12 12  --   ALKPHOS 66 63  --   BILITOT 1.3* 1.5*  --   PROT 5.5* 5.2*  --   ALBUMIN 2.6* 2.5* 2.4*   Recent Labs    06/06/22 0139 06/06/22 0845 06/06/22 1612 06/07/22 0340  WBC 8.7   < > 7.8 7.6  NEUTROABS 8.0*  --   --   --   HGB 7.5*   < > 8.3* 9.5*  HCT 23.4*   < > 25.7* 27.8*  MCV 98.7   < > 100.8* 97.5  PLT 204   < > 212 235   < > = values in this interval not displayed.   Recent Labs    06/05/22 2018  LABPROT 22.9*  INR 2.1*      Assessment/Plan: -Anemia with dark stools.  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  Hemoglobin was 6.9 on admission.  Hemoglobin improved with blood transfusion. -Paroxysmal atrial fibrillation.  Eliquis on hold -COVID-19 pneumonia -CHF as well as chronic  respiratory failure on home oxygen -Chronic kidney disease     Recommendations -------------------------- -Patient with multiple comorbidities with chronic respiratory failure, CHF and now admitted with COVID-19 pneumonia. -Continue supportive care with IV twice daily PPI for now -Okay to resume Eliquis tomorrow if hemoglobin stable and no overt bleeding. -Okay to advance diet from GI standpoint.  Call us back if she develops any overt bleeding or if there is ongoing drop in hemoglobin.  GI will sign off. - D/W Dr. Candiss Norse.    Otis Brace MD, [place] 06/07/2022, 12:50 PM  Contact #  [phone]

## 2022-06-07 NOTE — Evaluation (Signed)
Physical Therapy Evaluation Patient Details Name: Tricia Gonzales MRN: [id number] DOB: 1940-09-27 Today's Date: 06/07/2022  History of Present Illness  81 yo female admitted with fever, general malaise, and dark stools; Covid +; GI on board; with medical history significant for chronic biventricular systolic heart failure, paroxysmal atrial fibrillation complicated by sick sinus syndrome status post pacemaker placement, chronically anticoagulated on Eliquis, chronic hypoxic respiratory failure on 2 L continuous nasal cannula, type 2 diabetes mellitus, CKD 4, anemia  Clinical Impression  Pt admitted with above diagnosis. Lives at home alone with prn assist from family, in a single-level home with 4 steps to enter; Prior to admission, pt was able to manage household distances with a cane; Presents to PT with generalized malaise, decr activity tolerance, functional depencendies;  Overall min assist to get up, stand, and transfer to recliner; Pt currently with functional limitations due to the deficits listed below (see PT Problem List). Pt will benefit from skilled PT to increase their independence and safety with mobility to allow discharge to the venue listed below.          Recommendations for follow up therapy are one component of a multi-disciplinary discharge planning process, led by the attending physician.  Recommendations may be updated based on patient status, additional functional criteria and insurance authorization.  Follow Up Recommendations Home health PT      Assistance Recommended at Discharge Intermittent Supervision/Assistance  Patient can return home with the following  Assistance with cooking/housework;Assist for transportation;Help with stairs or ramp for entrance    Equipment Recommendations None recommended by PT  Recommendations for Other Services       Functional Status Assessment Patient has had a recent decline in their functional status and demonstrates the ability  to make significant improvements in function in a reasonable and predictable amount of time.     Precautions / Restrictions Precautions Precautions: Fall;Other (comment) Precaution Comments: Watch SpO2 (does not wear O2 baseline) Restrictions Weight Bearing Restrictions: No      Mobility  Bed Mobility Overal bed mobility: Needs Assistance Bed Mobility: Supine to Sit     Supine to sit: Modified independent (Device/Increase time)     General bed mobility comments: HoB elevated    Transfers Overall transfer level: Needs assistance Equipment used: Rolling walker (2 wheels) Transfers: Sit to/from Stand, Bed to chair/wheelchair/BSC Sit to Stand: Min guard   Step pivot transfers: Min guard       General transfer comment: [place]ard to stand with good power up; RW to step pivot to recliner -- pt reported some L knee pain -- cues to get to optiomal position before sitting down    Ambulation/Gait               General Gait Details: Deferred at pt's request  Stairs            Wheelchair Mobility    Modified Rankin (Stroke Patients Only)       Balance                                             Pertinent Vitals/Pain Pain Assessment Pain Assessment: No/denies pain    Home Living Family/patient expects to be discharged to:: Private residence Living Arrangements: Alone Available Help at Discharge: Family;Friend(s);Available PRN/intermittently Type of Home: House Home Access: Stairs to enter Entrance Stairs-Rails: Right;Left Entrance Stairs-Number of Steps: 4  Home Layout: One level Home Equipment: Conservation officer, nature (2 wheels);Rollator (4 wheels);Cane - single point;Shower seat Additional Comments: between daughter, son, sisters and friends, she states 1-2 people check on her daily    Prior Function Prior Level of Function : Independent/Modified Independent;Driving             Mobility Comments: mod indep limited community  ambulator with [facility] or rollator. drives, but has had increased difficulty loading/unloading groceries so daughter is going to help her set-up grocery delivery. works with [employer] Comments: uses shower seat for bathing and UE support to step over tub     Hand Dominance        Extremity/Trunk Assessment   Upper Extremity Assessment Upper Extremity Assessment: Generalized weakness    Lower Extremity Assessment Lower Extremity Assessment: Generalized weakness       Communication   Communication: No difficulties  Cognition Arousal/Alertness: Awake/alert Behavior During Therapy: WFL for tasks assessed/performed Overall Cognitive Status: Within Functional Limits for tasks assessed                                          General Comments General comments (skin integrity, edema, etc.): Session conducted on 3 L supplemental O2, with noted )2 sat drop to 86% with activity; came back up to 95% with seated rest    Exercises     Assessment/Plan    PT Assessment Patient needs continued PT services  PT Problem List Decreased strength;Decreased range of motion;Decreased activity tolerance;Decreased balance;Decreased mobility;Decreased knowledge of use of DME;Cardiopulmonary status limiting activity;Pain;Obesity       PT Treatment Interventions DME instruction;Gait training;Stair training;Functional mobility training;Therapeutic activities;Therapeutic exercise;Patient/family education;Balance training    PT Goals (Current goals can be found in the Care Plan section)  Acute Rehab PT Goals Patient Stated Goal: HOme soon PT Goal Formulation: With patient Time For Goal Achievement: 06/21/22 Potential to Achieve Goals: Good    Frequency Min 3X/week     Co-evaluation               AM-PAC PT "6 Clicks" Mobility  Outcome Measure Help needed turning from your back to your side while in a flat bed without using bedrails?: None Help needed moving from lying on  your back to sitting on the side of a flat bed without using bedrails?: None Help needed moving to and from a bed to a chair (including a wheelchair)?: A Little Help needed standing up from a chair using your arms (e.g., wheelchair or bedside chair)?: A Little Help needed to walk in hospital room?: A Lot Help needed climbing 3-5 steps with a railing? : A Lot 6 Click Score: 18    End of Session Equipment Utilized During Treatment: Gait belt Activity Tolerance: Patient tolerated treatment well Patient left: in chair;with call bell/phone within reach;with family/visitor present Nurse Communication: Mobility status;Other (comment) (and pt requests one hour in the recliner) PT Visit Diagnosis: Other abnormalities of gait and mobility (R26.89)    Time: 6381-7711 PT Time Calculation (min) (ACUTE ONLY): 29 min   Charges:   PT Evaluation $PT Eval Moderate Complexity: 1 Mod PT Treatments $Therapeutic Activity: 8-22 mins        Roney Marion, PT  Acute Rehabilitation Services Office [phone]   [place] 06/07/2022, 4:11 PM

## 2022-06-07 NOTE — Evaluation (Signed)
Clinical/Bedside Swallow Evaluation Patient Details  Name: Tricia Gonzales MRN: [id number] Date of Birth: 08/28/1940  Today's Date: 06/07/2022 Time: SLP Start Time (ACUTE ONLY): 0840 SLP Stop Time (ACUTE ONLY): 0900 SLP Time Calculation (min) (ACUTE ONLY): 20 min  Past Medical History:  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   Anxiety    Arthritis    Atrial fibrillation (HCC)    Avascular necrosis (HCC) hip left   leg pain also   Chronic back pain    Chronic kidney disease (CKD), stage III (moderate) (HCC)    Depression    Diverticulosis    Early cataracts, bilateral    Esophageal stricture    Gastritis    GAVE (gastric antral vascular ectasia)    GERD (gastroesophageal reflux disease)    Glaucoma    Heart murmur     " some doctors say that I have one some say that I don"t "   Hiatal hernia    History of blood transfusion    "@ least w/1st knee OR"   History of gout    History of kidney stones    Hyperlipidemia    Hypertension    Intestinal obstruction ([place])    Kidney stones    Obesity    Osteoarthritis    Osteoporosis    Pericarditis 2016   Presence of permanent cardiac pacemaker    Sjogren's disease ([place])    Thyroid disease    Type II diabetes mellitus ([place])    Vitamin B12 deficiency    Past Surgical History:  Past Surgical History:  Procedure Laterality Date   ABDOMINAL HYSTERECTOMY     complete   CARDIOVERSION N/A 07/31/2020   Procedure: CARDIOVERSION;  Surgeon: Werner Lean, MD;  Location: [facility];  Service: Cardiovascular;  Laterality: N/A;   CHOLECYSTECTOMY OPEN     COLECTOMY     for rectovaginal fistula   COLONOSCOPY  01/07/2012   Procedure: COLONOSCOPY;  Surgeon: Lafayette Dragon, MD;  Location: [facility];  Service: Endoscopy;  Laterality: N/A;   EP IMPLANTABLE DEVICE N/A 06/18/2015   Procedure: Pacemaker Implant;  Surgeon: Evans Lance, MD;  Location: [place] CV LAB;   Service: Cardiovascular;  Laterality: N/A;   ESOPHAGEAL DILATION  08/17/2020   Procedure: ESOPHAGEAL DILATION;  Surgeon: Lavena Bullion, DO;  Location: [place] ENDOSCOPY;  Service: Gastroenterology;;   ESOPHAGOGASTRODUODENOSCOPY N/A 11/24/2018   Procedure: ESOPHAGOGASTRODUODENOSCOPY (EGD);  Surgeon: Yetta Flock, MD;  Location: [facility];  Service: Gastroenterology;  Laterality: N/A;   ESOPHAGOGASTRODUODENOSCOPY (EGD) WITH ESOPHAGEAL DILATION     ESOPHAGOGASTRODUODENOSCOPY (EGD) WITH PROPOFOL N/A 08/12/2015   Procedure: ESOPHAGOGASTRODUODENOSCOPY (EGD) WITH PROPOFOL;  Surgeon: Manus Gunning, MD;  Location: [place];  Service: Gastroenterology;  Laterality: N/A;   ESOPHAGOGASTRODUODENOSCOPY (EGD) WITH PROPOFOL N/A 08/17/2020   Procedure: ESOPHAGOGASTRODUODENOSCOPY (EGD) WITH PROPOFOL;  Surgeon: Lavena Bullion, DO;  Location: [place];  Service: Gastroenterology;  Laterality: N/A;   EYE SURGERY Bilateral    cataracts   HOT HEMOSTASIS N/A 11/24/2018   Procedure: HOT HEMOSTASIS (ARGON PLASMA COAGULATION/BICAP);  Surgeon: Yetta Flock, MD;  Location: [facility];  Service: Gastroenterology;  Laterality: N/A;   HOT HEMOSTASIS N/A 08/17/2020   Procedure: HOT HEMOSTASIS (ARGON PLASMA COAGULATION/BICAP);  Surgeon: Lavena Bullion, DO;  Location: [facility] ENDOSCOPY;  Service: Gastroenterology;  Laterality: N/A;   I & D EXTREMITY Right 08/06/2016   Procedure: DEBRIDEMENT PIP RIGHT RING FINGER;  Surgeon: Daryll Brod,  MD;  Location: [place];  Service: Orthopedics;  Laterality: Right;   INSERT / REPLACE / REMOVE PACEMAKER     JOINT REPLACEMENT     KNEE ARTHROSCOPY Right    MASS EXCISION Right 08/06/2016   Procedure: EXCISION CYST;  Surgeon: Daryll Brod, MD;  Location: [place];  Service: Orthopedics;  Laterality: Right;   REVISION TOTAL KNEE ARTHROPLASTY Left    TOTAL KNEE ARTHROPLASTY Bilateral    HPI:  Patient is an 81  y.o. female with PMH: GERD, GAVE, esophageal stricture,  anxiety, anemia in CKD, diverticulosis, gastritis, HTN, HLD, intenstinal obstruction, hiatal hernia, chronic biventricular systolic heart failure, paroxysmal atrial fibrillation complicated by sick sinus syndrome status post pacemaker placement, chronic hypoxic respiratory failure. She presented to the hospital on 06/05/22 with  suspected acute upper GI bleed after presenting to [facility] ED from home, c/o fever and dark stools. She was recently hospitalized 11/30-12/7/23 for diuresis in the setting of acute on chronice systolic heart failure. She had EGD in February 2022 which showed lower esophageal stricture which was dilated. In ED, DRE demonstrated findings consistent with melena, with fecal occult blood positive result, COVID-19 positive. GI was consulted and recommending supportive care and IV twice daily PPI. SLP swallow evaluation ordered due to concern for potential aspiration PNA.    Assessment / Plan / Recommendation  Clinical Impression  Patient is not currently presenting with clinical s/s of dysphagia as per this bedside swallow evaulation. She has h/o GERD and esophageal stricture (dilated in 2022) but no documented h/o oral or pharyngeal phase dysphagia. GI has already seen patient and started her on a clear liquids diet. SLP observed patient with breakfast tray: juice, broth, gelatin. Patient's swallow initiation appeared timely and no overt s/s aspiration or penetration observed. SpO2 and RR remained WFL with no observed significant changes. Patient appears safe on current diet as prescribed by GI (clear liquids) and SLP recommends following GI recommendations for further upgrades of solid textures. SLP Visit Diagnosis: Dysphagia, unspecified (R13.10)    Aspiration Risk  No limitations;Mild aspiration risk    Diet Recommendation Regular;Dysphagia 3 (Mech soft);Thin liquid   Liquid Administration via: Cup;Straw Medication Administration: Whole meds with liquid Supervision: Patient able to  self feed Postural Changes: Remain upright for at least 30 minutes after po intake;Seated upright at 90 degrees    Other  Recommendations Oral Care Recommendations: Oral care BID    Recommendations for follow up therapy are one component of a multi-disciplinary discharge planning process, led by the attending physician.  Recommendations may be updated based on patient status, additional functional criteria and insurance authorization.  Follow up Recommendations No SLP follow up      Assistance Recommended at Discharge    Functional Status Assessment Patient has not had a recent decline in their functional status  Frequency and Duration   N/A         Prognosis  N/A       Swallow Study   General Date of Onset: 06/05/22 HPI: Patient is an 81  y.o. female with PMH: GERD, GAVE, esophageal stricture, anxiety, anemia in CKD, diverticulosis, gastritis, HTN, HLD, intenstinal obstruction, hiatal hernia, chronic biventricular systolic heart failure, paroxysmal atrial fibrillation complicated by sick sinus syndrome status post pacemaker placement, chronic hypoxic respiratory failure. She presented to the hospital on 06/05/22 with  suspected acute upper GI bleed after presenting to [facility] ED from home, c/o fever and dark stools. She was recently hospitalized 11/30-12/7/23 for diuresis in the setting of acute on  chronice systolic heart failure. She had EGD in February 2022 which showed lower esophageal stricture which was dilated. In ED, DRE demonstrated findings consistent with melena, with fecal occult blood positive result, COVID-19 positive. GI was consulted and recommending supportive care and IV twice daily PPI. SLP swallow evaluation ordered due to concern for potential aspiration PNA. Type of Study: Bedside Swallow Evaluation Previous Swallow Assessment: none found Diet Prior to this Study: Thin liquids Temperature Spikes Noted: No Respiratory Status: Nasal cannula History of Recent Intubation:  No Behavior/Cognition: Alert;Cooperative;Pleasant mood Oral Cavity Assessment: Within Functional Limits Oral Care Completed by SLP: No Oral Cavity - Dentition: Adequate natural dentition Vision: Functional for self-feeding Self-Feeding Abilities: Able to feed self Patient Positioning: Upright in bed Baseline Vocal Quality: Normal Volitional Cough: Strong Volitional Swallow: Able to elicit    Oral/Motor/Sensory Function Overall Oral Motor/Sensory Function: Within functional limits   Ice Chips     Thin Liquid Thin Liquid: Within functional limits Presentation: Straw;Self Fed    Nectar Thick     Honey Thick     Puree     Solid     Solid: Within functional limits Presentation: Spoon;Self Fed Other Comments: (gelatin)      Sonia Baller, MA, CCC-SLP Speech Therapy

## 2022-06-07 NOTE — Progress Notes (Signed)
PROGRESS NOTE        PATIENT DETAILS Name: Tricia Gonzales Age: 81 y.o. Sex: female Date of Birth: 02/05/1941 Admit Date: 06/05/2022 Admitting Physician Rhetta Mura, DO PNP:YYFRT, Hal Hope, MD  Brief Summary: Patient is a 81 y.o.  female with history of chronic HFrEF, PAF on Eliquis, sick sinus syndrome-s/p PPM placement, chronic hypoxic respiratory failure on home O2, DM-2, CKD 4 who presented with subjective fever-was found to have PNA/COVID-19 infection, melena/GI bleed with acute blood loss anemia and AKI on CKD 4.  Significant events: 11/30-12/07>> hospitalization- acute on chronic HFrEF 12/15>> hospitalization for COVID infection/PNA/possible GI bleed/AKI on CKD 4.  Significant studies: 11/20>> echo: EF 02-11%, RV systolic function moderately reduced. 12/15>> CXR: Retrocardiac consolidation/atelectasis.  Significant microbiology data: 12/15>> COVID PCR: Positive (CT value 26.1) 12/15>> influenza A/B/RSV: Negative  Procedures: None  Consults: GI Cardiology  Subjective:  Patient in bed, appears comfortable, denies any headache, no fever, no chest pain or pressure, no shortness of breath , no abdominal pain. No new focal weakness.  Objective: Vitals: Blood pressure (!) 143/59, pulse 67, temperature 98 F (36.7 C), temperature source Oral, resp. rate 16, height _0  (1.473 m), weight 81.6 kg, SpO2 97 %.   Exam:  Awake Alert, No new F.N deficits, Normal affect [place].AT,PERRAL Supple Neck, No JVD,   Symmetrical Chest wall movement, Good air movement bilaterally, CTAB RRR,No Gallops, Rubs or new Murmurs,  +ve B.Sounds, Abd Soft, No tenderness,   No Cyanosis, Clubbing or edema   Assessment/Plan: Upper GI bleed with acute blood loss anemia No further melanotic stools since presentation (on iron supplementation at home) S/p 2 units of PRBC-this admission, currently stable continue to monitor. Continue PPI-appreciate GI input, likely  outpatient endoscopy procedures if needed.  Borderline low B12.  Placed on replacement.    AKI on CKD 4 AKI likely hemodynamically mediated due to GI bleed Supportive care Avoid nephrotoxic agents Watch closely-if worsens significantly-will obtain nephrology evaluation.  PNA Likely bacterial-perhaps aspiration Obtain SLP eval Although COVID-positive-procalcitonin is elevated-CXR more consistent with bacterial PNA-hence we will continue with antibiotics.  COVID-19 infection Remdesivir x 3 days Procalcitonin elevated Awaiting CRP Stable on 2 L of oxygen-this is her home regimen Hold off on steroids for now  Chronic HFrEF Volume status relatively stable Given ongoing GI bleed/AKI-diuretics on hold  PAF Rate controlled with Coreg Eliquis on hold Monitor in telemetry.  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   Weight as of this encounter: 81.6 kg.   Code status:   Code Status: DNR reconfirmed with daughter/patient this morning.  DVT Prophylaxis: SCDs Start: 06/05/22 2151   Family Communication: Daughter-[name]-[phone] updated over the phone on 12/16   Disposition Plan: Status is: Inpatient Remains inpatient appropriate because: Upper GI bleed-acute blood loss anemia-PNA-COVID infection-AKI-not yet stable for discharge.   Planned Discharge Destination:Home health   Diet: Diet Order  Diet clear liquid Room service appropriate? Yes; Fluid consistency: Thin  Diet effective now                     Antimicrobial agents: Anti-infectives (From admission, onward)    Start     Dose/Rate Route Frequency Ordered Stop   06/07/22 1000  remdesivir 100 mg in sodium chloride 0.9 % 100 mL IVPB       See Hyperspace for full Linked Orders Report.   100 mg 200 mL/hr over 30 Minutes Intravenous Daily 06/06/22 0927 06/09/22 0959   06/06/22 1700  cefTRIAXone (ROCEPHIN) 1 g in sodium chloride 0.9 % 100 mL IVPB        1 g 200 mL/hr over 30 Minutes Intravenous Every 24 hours 06/05/22 2158     06/06/22 1700  azithromycin (ZITHROMAX) 500 mg in sodium chloride 0.9 % 250 mL IVPB        500 mg 250 mL/hr over 60 Minutes Intravenous Every 24 hours 06/05/22 2158     06/06/22 1100  remdesivir 200 mg in sodium chloride 0.9% 250 mL IVPB       See Hyperspace for full Linked Orders Report.   200 mg 580 mL/hr over 30 Minutes Intravenous Once 06/06/22 0927 06/06/22 1250   06/05/22 2115  cefTRIAXone (ROCEPHIN) 1 g in sodium chloride 0.9 % 100 mL IVPB        1 g 200 mL/hr over 30 Minutes Intravenous  Once 06/05/22 2108 06/05/22 2306   06/05/22 2115  azithromycin (ZITHROMAX) 500 mg in sodium chloride 0.9 % 250 mL IVPB        500 mg 250 mL/hr over 60 Minutes Intravenous  Once 06/05/22 2108 06/06/22 0109        MEDICATIONS: Scheduled Meds:  carvedilol  12.5 mg Oral BID WC   cyanocobalamin  1,000 mcg Subcutaneous Daily   [START ON 06/10/2022] vitamin B-12  1,000 mcg Oral Daily   [START ON 06/08/2022] febuxostat  40 mg Oral Q M,W,F   insulin aspart  0-9 Units Subcutaneous Q6H   levothyroxine  75 mcg Oral QHS   montelukast  10 mg Oral QHS   [START ON 06/09/2022] pantoprazole  40 mg Intravenous Q12H   potassium chloride  40 mEq Oral Once   rOPINIRole  0.5 mg Oral QHS   rosuvastatin  5 mg Oral Daily   Continuous Infusions:  azithromycin Stopped (06/06/22 1750)   cefTRIAXone  (ROCEPHIN)  IV Stopped (06/06/22 1841)   pantoprazole 8 mg/hr (06/07/22 0348)   remdesivir 100 mg in sodium chloride 0.9 % 100 mL IVPB 100 mg (06/07/22 0913)   PRN Meds:.acetaminophen **OR** acetaminophen, fentaNYL (SUBLIMAZE) injection, naLOXone (NARCAN)  injection, ondansetron (ZOFRAN) IV   I have personally reviewed following labs and imaging studies  LABORATORY DATA:  Recent Labs  Lab 06/05/22 2018 06/06/22 0139 06/06/22 0845 06/06/22 1030 06/06/22 1612 06/07/22 0340  WBC 8.7 8.7  --  10.0 7.8 7.6  HGB 6.9* 7.5* 7.0* 9.2* 8.3* 9.5*  HCT 21.8* 23.4* 20.6* 26.5* 25.7* 27.8*  PLT 209 204  --  229 212 235  MCV 100.9* 98.7  --  94.6 100.8* 97.5  MCH 31.9 31.6  --  32.9 32.5 33.3  MCHC 31.7 32.1  --  34.7 32.3 34.2  RDW 16.8* 17.3*  --  18.7* 19.7* 19.5*  LYMPHSABS  --  0.2*  --   --   --   --   MONOABS  --  0.4  --   --   --   --  EOSABS  --  0.0  --   --   --   --   BASOSABS  --  0.0  --   --   --   --     Recent Labs  Lab 06/02/22 1247 06/05/22 2018 06/06/22 0139 06/06/22 0657 06/06/22 0845 06/07/22 0340  NA 136 137 138  --   --  138  K 3.5 3.1* 3.4* 3.4*  --  3.1*  CL 90* 90* 91*  --   --  94*  CO2 33* 32 32  --   --  30  ANIONGAP _0 --   --  14  GLUCOSE 165* 160* 172*  --   --  166*  BUN 115* 161* 160*  --   --  153*  CREATININE 3.32* 4.10* 4.00*  --   --  3.83*  AST _1 --   --   --   ALT _2 --   --   --   ALKPHOS 73 66 63  --   --   --   BILITOT 1.3* 1.3* 1.5*  --   --   --   ALBUMIN 3.1* 2.6* 2.5*  --   --  2.4*  CRP  --   --   --  14.4* 10.5*  --   DDIMER  --   --   --  0.83*  --   --   PROCALCITON  --   --   --  1.73  --   --   INR  --  2.1*  --   --   --   --   BNP 2,414.4*  --   --   --   --   --   MG  --   --  2.2  --   --   --   CALCIUM 8.9 8.5* 8.5*  --   --  8.5*      Recent Labs  Lab 06/02/22 1247 06/05/22 2018 06/06/22 0139 06/06/22 0657 06/06/22 0845 06/07/22 0340  CRP  --   --   --  14.4* 10.5*  --   DDIMER   --   --   --  0.83*  --   --   PROCALCITON  --   --   --  1.73  --   --   INR  --  2.1*  --   --   --   --   BNP 2,414.4*  --   --   --   --   --   MG  --   --  2.2  --   --   --   CALCIUM 8.9 8.5* 8.5*  --   --  8.5*       RADIOLOGY STUDIES/RESULTS: DG Chest Port 1 View  Result Date: 06/07/2022 CLINICAL DATA:  81 year old female with shortness of breath. EXAM: PORTABLE CHEST 1 VIEW COMPARISON:  Portable chest 06/05/2022 and earlier. FINDINGS: Portable AP semi upright view at 0621 hours. The patient is more rotated to the left. Stable cardiomegaly and mediastinal contours. Stable left chest cardiac pacemaker. Left lung base hypo ventilation is stable. No pneumothorax or pulmonary edema. Probable small bilateral pleural effusions. Bilateral perihilar opacity persists, more apparent on the left but not significantly changed from 2 days ago. Visualized tracheal air column is within normal limits. No acute osseous abnormality identified. IMPRESSION: 1. Bilateral perihilar opacity not significantly changed from 2 days ago. This is  nonspecific, but more suspicious for perihilar edema or infection than atelectasis. 2. Otherwise stable Cardiomegaly with probable small bilateral pleural effusions. Electronically Signed   By: Genevie Ann M.D.   On: 06/07/2022 07:01   DG Chest Port 1 View  Result Date: 06/05/2022 CLINICAL DATA:  Cough and rectal bleeding EXAM: PORTABLE CHEST 1 VIEW COMPARISON:  Radiographs 05/22/2022 FINDINGS: Similar cardiomegaly. Aortic atherosclerotic calcification. Left chest wall dual-chamber pacemaker. Similar chronic bronchitic changes. Pulmonary vascular congestion. Left-greater-than-right basilar atelectasis. Question small left pleural effusion. No pneumothorax. No definite acute osseous abnormality. IMPRESSION: Retrocardiac atelectasis/consolidation. Pneumonia is difficult to exclude. Cardiomegaly and pulmonary vascular congestion. Electronically Signed   By: Placido Sou M.D.    On: 06/05/2022 20:07     LOS: 2 days   Signature  -    Lala Lund M.D on 06/07/2022 at 11:14 AM   -  To page go to [email or web address]

## 2022-06-08 DIAGNOSIS — K922 Gastrointestinal hemorrhage, unspecified: Secondary | ICD-10-CM | POA: Diagnosis not present

## 2022-06-08 LAB — CBC WITH DIFFERENTIAL/PLATELET
Abs Immature Granulocytes: 0.1 10*3/uL — ABNORMAL HIGH (ref 0.00–0.07)
Basophils Absolute: 0 10*3/uL (ref 0.0–0.1)
Basophils Relative: 0 %
Eosinophils Absolute: 0.2 10*3/uL (ref 0.0–0.5)
Eosinophils Relative: 2 %
HCT: 23.5 % — ABNORMAL LOW (ref 36.0–46.0)
Hemoglobin: 8.7 g/dL — ABNORMAL LOW (ref 12.0–15.0)
Immature Granulocytes: 1 %
Lymphocytes Relative: 5 %
Lymphs Abs: 0.3 10*3/uL — ABNORMAL LOW (ref 0.7–4.0)
MCH: 38 pg — ABNORMAL HIGH (ref 26.0–34.0)
MCHC: 37 g/dL — ABNORMAL HIGH (ref 30.0–36.0)
MCV: 102.6 fL — ABNORMAL HIGH (ref 80.0–100.0)
Monocytes Absolute: 0.6 10*3/uL (ref 0.1–1.0)
Monocytes Relative: 8 %
Neutro Abs: 5.9 10*3/uL (ref 1.7–7.7)
Neutrophils Relative %: 84 %
Platelets: 236 10*3/uL (ref 150–400)
RBC: 2.29 MIL/uL — ABNORMAL LOW (ref 3.87–5.11)
RDW: 21.7 % — ABNORMAL HIGH (ref 11.5–15.5)
Smear Review: NORMAL
WBC: 7.1 10*3/uL (ref 4.0–10.5)
nRBC: 0 % (ref 0.0–0.2)

## 2022-06-08 LAB — BASIC METABOLIC PANEL
Anion gap: 11 (ref 5–15)
BUN: 153 mg/dL — ABNORMAL HIGH (ref 8–23)
CO2: 30 mmol/L (ref 22–32)
Calcium: 8.6 mg/dL — ABNORMAL LOW (ref 8.9–10.3)
Chloride: 95 mmol/L — ABNORMAL LOW (ref 98–111)
Creatinine, Ser: 3.74 mg/dL — ABNORMAL HIGH (ref 0.44–1.00)
GFR, Estimated: 12 mL/min — ABNORMAL LOW (ref >60)
Glucose, Bld: 180 mg/dL — ABNORMAL HIGH (ref 70–99)
Potassium: 4.2 mmol/L (ref 3.5–5.1)
Sodium: 136 mmol/L (ref 135–145)

## 2022-06-08 LAB — BRAIN NATRIURETIC PEPTIDE: B Natriuretic Peptide: 2364.1 pg/mL — ABNORMAL HIGH (ref 0.0–100.0)

## 2022-06-08 LAB — GLUCOSE, CAPILLARY
Glucose-Capillary: 186 mg/dL — ABNORMAL HIGH (ref 70–99)
Glucose-Capillary: 195 mg/dL — ABNORMAL HIGH (ref 70–99)
Glucose-Capillary: 209 mg/dL — ABNORMAL HIGH (ref 70–99)
Glucose-Capillary: 211 mg/dL — ABNORMAL HIGH (ref 70–99)

## 2022-06-08 LAB — C-REACTIVE PROTEIN: CRP: 9.9 mg/dL — ABNORMAL HIGH (ref <1.0)

## 2022-06-08 LAB — MAGNESIUM: Magnesium: 2.1 mg/dL (ref 1.7–2.4)

## 2022-06-08 NOTE — Evaluation (Signed)
Occupational Therapy Evaluation Patient Details Name: Tricia Gonzales MRN: [id number] DOB: 26-Nov-1940 Today's Date: 06/08/2022   History of Present Illness 81 yo female admitted with fever, general malaise, and dark stools; Covid +; GI on board; with medical history significant for chronic biventricular systolic heart failure, paroxysmal atrial fibrillation complicated by sick sinus syndrome status post pacemaker placement, chronically anticoagulated on Eliquis, chronic hypoxic respiratory failure on 2 L continuous nasal cannula, type 2 diabetes mellitus, CKD 4, anemia   Clinical Impression   Pt is typically mod I in home environment with SPC, and DME for bathing. Today she presents with decreased activity tolerance, generalized weakness, increased dependence on supplemental O2 (dropped to 84% on RA in seated position), decreased balance, and overall mod A for ADL and min guard A for transfers. Pt complaining of L knee pain and requiring more assist for bed mobility than yesterday's PT evaluation. At this time OT will continue to follow acutely and Pt stating she is not interested in [facility] at this time. Next session to focus on HEP and energy conservation strategy as well OOB activity.       Recommendations for follow up therapy are one component of a multi-disciplinary discharge planning process, led by the attending physician.  Recommendations may be updated based on patient status, additional functional criteria and insurance authorization.   Follow Up Recommendations  No OT follow up (Pt declined HHOT at this time)     Assistance Recommended at Discharge Intermittent Supervision/Assistance  Patient can return home with the following A little help with walking and/or transfers;A lot of help with bathing/dressing/bathroom;Assistance with cooking/housework;Assist for transportation;Help with stairs or ramp for entrance    Functional Status Assessment  Patient has had a recent decline in their  functional status and demonstrates the ability to make significant improvements in function in a reasonable and predictable amount of time.  Equipment Recommendations  BSC/3in1    Recommendations for Other Services PT consult     Precautions / Restrictions Precautions Precautions: Fall;Other (comment) Precaution Comments: Watch SpO2 (does not wear O2 baseline) COVID + Restrictions Weight Bearing Restrictions: No      Mobility Bed Mobility Overal bed mobility: Needs Assistance Bed Mobility: Supine to Sit     Supine to sit: Mod assist     General bed mobility comments: assist with BLE, use of bed pad to bring hips EOB    Transfers Overall transfer level: Needs assistance Equipment used: Rolling walker (2 wheels) Transfers: Sit to/from Stand, Bed to chair/wheelchair/BSC Sit to Stand: Min guard, Min assist     Step pivot transfers: Min guard, Min assist     General transfer comment: Minguard assist to stand with good power up; RW to step pivot >BSC> recliner      Balance Overall balance assessment: Needs assistance   Sitting balance-Leahy Scale: Good     Standing balance support: No upper extremity supported, Single extremity supported, During functional activity Standing balance-Leahy Scale: Fair Standing balance comment: can static stand and take steps without UE support, preference for [facility] (but not present at this time)                           ADL either performed or assessed with clinical judgement   ADL Overall ADL's : Needs assistance/impaired Eating/Feeding: Set up   Grooming: Wash/dry hands;Wash/dry face;Set up;Sitting Grooming Details (indicate cue type and reason): unable to maintain standing at sink at this time Upper Body Bathing: Moderate  assistance   Lower Body Bathing: Moderate assistance   Upper Body Dressing : Minimal assistance;Sitting Upper Body Dressing Details (indicate cue type and reason): donning gown like robe Lower  Body Dressing: Maximal assistance;Sitting/lateral leans Lower Body Dressing Details (indicate cue type and reason): donning socks Toilet Transfer: Minimal assistance;Min guard;Stand-pivot;BSC/3in1;Rolling walker (2 wheels)   Toileting- Clothing Manipulation and Hygiene: Maximal assistance;Sit to/from stand Toileting - Clothing Manipulation Details (indicate cue type and reason): Pt able to stand min guard and OT performed Peri care while patient was in standing     Functional mobility during ADLs: Min guard;Minimal assistance;Rolling walker (2 wheels) General ADL Comments: decreased activity tolerance     Vision Patient Visual Report: No change from baseline       Perception     Praxis      Pertinent Vitals/Pain Pain Assessment Pain Assessment: Faces Faces Pain Scale: Hurts even more Pain Location: L knee Pain Descriptors / Indicators: Grimacing, Constant, Shooting Pain Intervention(s): Monitored during session, Repositioned     Hand Dominance Right   Extremity/Trunk Assessment Upper Extremity Assessment Upper Extremity Assessment: Generalized weakness   Lower Extremity Assessment Lower Extremity Assessment: Generalized weakness (pain in left knee)       Communication Communication Communication: No difficulties   Cognition Arousal/Alertness: Awake/alert Behavior During Therapy: WFL for tasks assessed/performed Overall Cognitive Status: Within Functional Limits for tasks assessed                                       General Comments  Pt briefly on RA during sitting on BSC for line management and Pt dropped to 84%, quickly back to >90% on 3L. Drops to low 90's during activity    Exercises     Shoulder Instructions      Home Living Family/patient expects to be discharged to:: Private residence Living Arrangements: Alone Available Help at Discharge: Family;Friend(s);Available PRN/intermittently Type of Home: House Home Access: Stairs to  enter [employer] of Steps: 4 Entrance Stairs-Rails: Right;Left Home Layout: One level     Bathroom Shower/Tub: Teacher, early years/pre: Standard     Home Equipment: Conservation officer, nature (2 wheels);Rollator (4 wheels);Cane - single point;Shower seat   Additional Comments: between daughter, son, sisters and friends, she states 1-2 people check on her daily      Prior Functioning/Environment Prior Level of Function : Independent/Modified Independent;Driving             Mobility Comments: mod indep limited community ambulator with [facility] or rollator. drives, but has had increased difficulty loading/unloading groceries so daughter is going to help her set-up grocery delivery. works with [employer] ADLs Comments: uses shower seat for bathing and UE support to step over tub        OT Problem List: Decreased strength;Decreased activity tolerance;Impaired balance (sitting and/or standing);Decreased safety awareness;Decreased knowledge of use of DME or AE;Cardiopulmonary status limiting activity;Obesity      OT Treatment/Interventions: Self-care/ADL training;Therapeutic exercise;Energy conservation;Therapeutic activities;Patient/family education;Balance training    OT Goals(Current goals can be found in the care plan section) Acute Rehab OT Goals Patient Stated Goal: get my strength back OT Goal Formulation: With patient Time For Goal Achievement: 06/22/22 Potential to Achieve Goals: Good ADL Goals Pt Will Perform Grooming: with modified independence;sitting Pt Will Perform Upper Body Dressing: with modified independence;sitting Pt Will Perform Lower Body Dressing: with supervision;sit to/from stand Pt Will Transfer to Toilet: with supervision;ambulating;regular height  toilet Pt Will Perform Toileting - Clothing Manipulation and hygiene: with supervision;sit to/from stand Additional ADL Goal #1: Pt will perform bed mobility at Mod I level prior to engaging in  ADL Additional ADL Goal #2: Pt will recall at least 3 energy conservation strategies to use during ADL with no cues  OT Frequency: Min 2X/week    Co-evaluation              AM-PAC OT "6 Clicks" Daily Activity     Outcome Measure Help from another person eating meals?: None Help from another person taking care of personal grooming?: A Little Help from another person toileting, which includes using toliet, bedpan, or urinal?: A Lot Help from another person bathing (including washing, rinsing, drying)?: A Lot Help from another person to put on and taking off regular upper body clothing?: A Little Help from another person to put on and taking off regular lower body clothing?: A Lot 6 Click Score: 16   End of Session Equipment Utilized During Treatment: Gait belt;Rolling walker (2 wheels);Oxygen (3L) Nurse Communication: Mobility status;Precautions  Activity Tolerance: Patient tolerated treatment well Patient left: in chair;with call bell/phone within reach (chair alarm in place - but no box to plug into in room)  OT Visit Diagnosis: Unsteadiness on feet (R26.81);Muscle weakness (generalized) (M62.81);Pain Pain - Right/Left: Left Pain - part of body: Knee                Time: 8264-1583 OT Time Calculation (min): 28 min Charges:  OT General Charges $OT Visit: 1 Visit OT Evaluation $OT Eval Moderate Complexity: 1 Mod OT Treatments $Self Care/Home Management : 8-22 mins  Jesse Sans OTR/L Acute Rehabilitation Services Office: [place] 06/08/2022, 1:08 PM

## 2022-06-08 NOTE — Progress Notes (Signed)
Mobility Specialist Progress Note   06/08/22 1435  Mobility  Activity Ambulated with assistance in room;Transferred from chair to bed  Level of Assistance Contact guard assist, steadying assist  [place] wheel walker  Distance Ambulated (ft) 10 ft  Range of Motion/Exercises Active;All extremities  Activity Response Tolerated well   Patient received in recliner and agreeable to participate. Required min A + extra time to stand from recliner chair. Ambulated min A to close min guard for safety with slow gait. Distance limited by poor endurance, increased SOB and chronic knee pain. Did c/o "wooziness" that subsided with seated rest. Unclear on oxygen saturation as pleth on pulse oximeter was inconsistent.  Was assisted into supine with mod A and was left with all needs met, call bell in reach.   Tricia Gonzales, BS EXP Mobility Specialist Please contact via SecureChat or Rehab office at [phone]

## 2022-06-08 NOTE — Progress Notes (Incomplete)
Initial Nutrition Assessment  DOCUMENTATION CODES:      INTERVENTION:  ***   NUTRITION DIAGNOSIS:     related to   as evidenced by  .  ***  GOAL:      ***  MONITOR:      REASON FOR ASSESSMENT:   Malnutrition Screening Tool    ASSESSMENT:   81 y.o. female admits related to subjective fever. PMH includes: heart failure, afib, T2DM, respiratory failure, CKD stage 4. Pt is currently receiving medical management for upper GI bleed with acute blood loss anemia.  Meds reviewed: Vit B12, sliding scale insulin. Labs reviewed.     NUTRITION - FOCUSED PHYSICAL EXAM:  {RD Focused Exam List:21252}  Diet Order:   Diet Order             Diet clear liquid Room service appropriate? Yes; Fluid consistency: Thin  Diet effective now                   EDUCATION NEEDS:      Skin:  Skin Assessment: Reviewed RN Assessment  Last BM:     Height:   Ht Readings from Last 1 Encounters:  06/05/22 _0  (1.473 m)    Weight:   Wt Readings from Last 1 Encounters:  06/05/22 81.6 kg    Ideal Body Weight:     BMI:  Body mass index is 37.62 kg/m.  Estimated Nutritional Needs:   Kcal:  1630-2040 kcals  Protein:  80-100 gm  Fluid:  >/= 1.6 L  Thalia Bloodgood, RD, LDN, CNSC.

## 2022-06-08 NOTE — Progress Notes (Signed)
Progress Note  Patient Name: GENEVIENE TESCH Date of Encounter: 06/08/2022  Primary Cardiologist: Evalina Field, MD   Subjective   Patient feels much better. Still needs O2 for therapy (was unable to wean off)  Inpatient Medications    Scheduled Meds:  carvedilol  12.5 mg Oral BID WC   cyanocobalamin  1,000 mcg Subcutaneous Daily   [START ON 06/10/2022] vitamin B-12  1,000 mcg Oral Daily   febuxostat  40 mg Oral Q M,W,F   insulin aspart  0-9 Units Subcutaneous Q6H   levothyroxine  75 mcg Oral QHS   montelukast  10 mg Oral QHS   [START ON 06/09/2022] pantoprazole  40 mg Intravenous Q12H   rOPINIRole  0.5 mg Oral QHS   rosuvastatin  5 mg Oral Daily   Continuous Infusions:  azithromycin 500 mg (06/07/22 1807)   cefTRIAXone (ROCEPHIN)  IV 1 g (06/07/22 1727)   pantoprazole 8 mg/hr (06/08/22 1104)   PRN Meds: acetaminophen **OR** acetaminophen, fentaNYL (SUBLIMAZE) injection, naLOXone (NARCAN)  injection, ondansetron (ZOFRAN) IV   Vital Signs    Vitals:   06/08/22 0400 06/08/22 0500 06/08/22 0800 06/08/22 1206  BP: 130/73  (!) 134/57   Pulse: 97 90 94   Resp: _0 Temp: 98.4 F (36.9 C)  (!) 97.5 F (36.4 C) 97.8 F (36.6 C)  TempSrc: Oral  Oral   SpO2: 100% 99% 98%   Weight:      Height:       No intake or output data in the 24 hours ending 06/08/22 1315  Filed Weights   06/05/22 1856  Weight: 81.6 kg    Telemetry    AF rate controlled - Personally Reviewed  Physical Exam   Gen: no distress Neck: JVD with prominent V wave Cardiac: No Rubs or Gallops, systolic murmur, IRIR Respiratory: decreased breath sounds, normal effort, normal  respiratory rate Needs O2 GI: Soft, nontender, non-distended  MS: No  edema;  moves all extremities Integument: Skin feels warm Neuro:  At time of evaluation, alert and oriented to person/place/time/situation  Psych: Normal affect, patient feels ok   Labs    Chemistry Recent Labs  Lab 06/02/22 1247  06/05/22 2018 06/06/22 0139 06/06/22 0657 06/07/22 0340 06/08/22 0435  NA 136 137 138  --  138 136  K 3.5 3.1* 3.4* 3.4* 3.1* 4.2  CL 90* 90* 91*  --  94* 95*  CO2 33* 32 32  --  30 30  GLUCOSE 165* 160* 172*  --  166* 180*  BUN 115* 161* 160*  --  153* 153*  CREATININE 3.32* 4.10* 4.00*  --  3.83* 3.74*  CALCIUM 8.9 8.5* 8.5*  --  8.5* 8.6*  PROT 5.9* 5.5* 5.2*  --   --   --   ALBUMIN 3.1* 2.6* 2.5*  --  2.4*  --   AST _1 --   --   --   ALT _2 --   --   --   ALKPHOS 73 66 63  --   --   --   BILITOT 1.3* 1.3* 1.5*  --   --   --   GFRNONAA 13* 10* 11*  --  11* 12*  ANIONGAP _3 --  14 11     Hematology Recent Labs  Lab 06/06/22 1612 06/07/22 0340 06/08/22 0435  WBC 7.8 7.6 7.1  RBC 2.55* 2.85* 2.29*  HGB 8.3* 9.5* 8.7*  HCT 25.7* 27.8* 23.5*  MCV 100.8* 97.5 102.6*  MCH 32.5 33.3 38.0*  MCHC 32.3 34.2 37.0*  RDW 19.7* 19.5* 21.7*  PLT 212 235 236    Cardiac EnzymesNo results for input(s): "TROPONINI" in the last 168 hours. No results for input(s): "TROPIPOC" in the last 168 hours.   BNP Recent Labs  Lab 06/02/22 1247 06/08/22 0435  BNP 2,414.4* 2,364.1*     DDimer  Recent Labs  Lab 06/06/22 0657  DDIMER 0.83*     Radiology    DG Chest Port 1 View  Result Date: 06/07/2022 CLINICAL DATA:  81 year old female with shortness of breath. EXAM: PORTABLE CHEST 1 VIEW COMPARISON:  Portable chest 06/05/2022 and earlier. FINDINGS: Portable AP semi upright view at 0621 hours. The patient is more rotated to the left. Stable cardiomegaly and mediastinal contours. Stable left chest cardiac pacemaker. Left lung base hypo ventilation is stable. No pneumothorax or pulmonary edema. Probable small bilateral pleural effusions. Bilateral perihilar opacity persists, more apparent on the left but not significantly changed from 2 days ago. Visualized tracheal air column is within normal limits. No acute osseous abnormality identified. IMPRESSION: 1. Bilateral  perihilar opacity not significantly changed from 2 days ago. This is nonspecific, but more suspicious for perihilar edema or infection than atelectasis. 2. Otherwise stable Cardiomegaly with probable small bilateral pleural effusions. Electronically Signed   By: Genevie Ann M.D.   On: 06/07/2022 07:01    Cardiac Studies   Cardiac Studies & Procedures       ECHOCARDIOGRAM  ECHOCARDIOGRAM LIMITED 05/11/2022  Narrative ECHOCARDIOGRAM LIMITED REPORT    Patient Name:   SANOE HAZAN Meiring Date of Exam: 05/11/2022 Medical Rec #:  [id number]     Height:       58.0 in Accession #:    [id number]    Weight:       191.0 lb Date of Birth:  Nov 12, 1940     BSA:          1.786 m Patient Age:    81 years      BP:           152/98 mmHg Patient Gender: F             HR:           76 bpm. Exam Location:  [address]  Procedure: Limited Echo, Cardiac Doppler and Limited Color Doppler  Indications:    I51.7 Enlarged RV. Evaluate MR.  History:        Patient has prior history of Echocardiogram examinations, most recent 03/20/2022. CHF, Pacemaker, Arrythmias:Bradycardia, Sinus node dysfunction, Atrial Flutter and Atrial Fibrillation; Risk Factors:Hypertension, Diabetes, Dyslipidemia and Former Smoker. Pericarditis. Anemia. Obesity. Sjogren's disease. CKD stage 3.  Sonographer:    Basilia Jumbo BS, RDCS Referring Phys: [phone]   1. Left ventricular ejection fraction, by estimation, is 30 to 35%. The left ventricle demonstrates global hypokinesis. There is mild concentric left ventricular hypertrophy. Left ventricular diastolic function could not be evaluated. 2. Right ventricular systolic function is moderately reduced. The right ventricular size is moderately enlarged. There is severely elevated pulmonary artery systolic pressure. 3. The mitral valve is normal in structure. Moderate to severe mitral valve regurgitation. No evidence of mitral stenosis. Moderate mitral annular  calcification. 4. Tricuspid valve regurgitation is severe. 5. The aortic valve is normal in structure. There is mild calcification of the aortic valve. Aortic valve regurgitation is not visualized. Aortic valve sclerosis is present, with no evidence of  aortic valve stenosis. 6. The inferior vena cava is normal in size with greater than 50% respiratory variability, suggesting right atrial pressure of 3 mmHg.  Comparison(s): 03/20/22 EF 25-30%. PA pressure 67mHg. Moderate-severe MR. RV moderately enlarged.  FINDINGS Left Ventricle: Left ventricular ejection fraction, by estimation, is 30 to 35%. The left ventricle demonstrates global hypokinesis. The left ventricular internal cavity size was normal in size. There is mild concentric left ventricular hypertrophy. Left ventricular diastolic function could not be evaluated. Left ventricular diastolic function could not be evaluated due to mitral annular calcification (moderate or greater).  Right Ventricle: The right ventricular size is moderately enlarged. No increase in right ventricular wall thickness. Right ventricular systolic function is moderately reduced. There is severely elevated pulmonary artery systolic pressure. The tricuspid regurgitant velocity is 3.44 m/s, and with an assumed right atrial pressure of 15 mmHg, the estimated right ventricular systolic pressure is 677.8mmHg.  Left Atrium: Left atrial size was normal in size.  Right Atrium: Right atrial size was normal in size.  Pericardium: There is no evidence of pericardial effusion.  Mitral Valve: The mitral valve is normal in structure. Moderate mitral annular calcification. Moderate to severe mitral valve regurgitation, with posteriorly-directed jet. No evidence of mitral valve stenosis.  Tricuspid Valve: The tricuspid valve is normal in structure. Tricuspid valve regurgitation is severe. No evidence of tricuspid stenosis.  Aortic Valve: The aortic valve is normal in structure.  There is mild calcification of the aortic valve. Aortic valve regurgitation is not visualized. Aortic valve sclerosis is present, with no evidence of aortic valve stenosis.  Pulmonic Valve: The pulmonic valve was normal in structure. Pulmonic valve regurgitation is not visualized. No evidence of pulmonic stenosis.  Aorta: The aortic root is normal in size and structure.  Venous: The inferior vena cava is normal in size with greater than 50% respiratory variability, suggesting right atrial pressure of 3 mmHg.  IAS/Shunts: No atrial level shunt detected by color flow Doppler.  LEFT VENTRICLE PLAX 2D LVIDd:         5.50 cm   Diastology LVIDs:         4.40 cm   LV e' medial:    6.32 cm/s LV PW:         1.10 cm   LV E/e' medial:  15.8 LV IVS:        1.00 cm   LV e' lateral:   10.50 cm/s LVOT diam:     2.40 cm   LV E/e' lateral: 9.5 LVOT Area:     4.52 cm   RIGHT VENTRICLE            IVC RV Basal diam:  4.30 cm    IVC diam: 2.70 cm RV Mid diam:    3.60 cm RV S prime:     4.95 cm/s TAPSE (M-mode): 1.2 cm RVSP:           62.3 mmHg  LEFT ATRIUM         Index       RIGHT ATRIUM            Index LA diam:    5.70 cm 3.19 cm/m  RA Pressure: 15.00 mmHg RA Area:     22.80 cm RA Volume:   72.90 ml   40.82 ml/m  AORTA Ao Root diam: 3.50 cm  MITRAL VALVE                  TRICUSPID VALVE TR Peak grad:  47.3 mmHg MV Decel Time: 165 msec       TR Vmax:        344.00 cm/s MR Peak grad:    131.8 mmHg   Estimated RAP:  15.00 mmHg MR Mean grad:    82.5 mmHg    RVSP:           62.3 mmHg MR Vmax:         574.00 cm/s MR Vmean:        428.0 cm/s   SHUNTS MR PISA:         4.02 cm     Systemic Diam: 2.40 cm MR PISA Eff ROA: 22 mm MR PISA Radius:  0.80 cm MV E velocity: 99.70 cm/s MV A velocity: 49.30 cm/s MV E/A ratio:  2.02  Kardie Tobb DO Electronically signed by Berniece Salines DO Signature Date/Time: 05/11/2022/2:44:00 PM    Final              Patient Profile     81 y.o. 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 #HTN -Hold home torsemide 40 Mg daily and once weekly metolazone until AKI resolves (improving but not resolved -Continue Coreg 18.75 mg twice daily; If BP is issues will stop hydralazine -Needs return of afterload reduction (Imdur and home hydralazine) once BP allows -Outpatient plan to upgrade PPM to BiV device - we are pushing back her    #PAF (on Eliquis) Prior DCCV 4970 Complicated by GI Bleed - holding AC till cleared by GI (potentially ~ 06/10/22 restart)   #HLD -Continuing statin   #AKI on CKD - improving; Creatinine ~3.0 at new baseline   #COVID PNA -Defer management to hospital medicine    [place] will sign off.   Medication Recommendations:  DOAC when OK with GI, coreg 18.75 Other recommendations (labs, testing, etc):  BNP and BMP in one week from DC; add back hydralazine 25 mg PO TID when able Follow up as an outpatient:  Torsemide 40 mg and metolazine will be decided after labs and at Late Dec early Jan f/u    For questions or updates, please contact [facility] Please consult [email or web address] for contact info under Cardiology/STEMI.      Rudean Haskell, MD [address], [address] [place], [place] [address] [phone]  1:15 PM

## 2022-06-08 NOTE — TOC Progression Note (Signed)
Transition of Care [facility]) - Progression Note    Patient Details  Name: Tricia Gonzales MRN: [id number] Date of Birth: [birth year]  Transition of [facility]) CM/SW Contact  Pollie Friar, RN Phone Number: 06/08/2022, 2:18 PM  Clinical Narrative:    Pt states she is active with [facility] at home but doesn't feel the PT coming out is doing a great job. CM called [facility] and asked that the PT be changed.  Pt states she doesn't feel ready to d/c tomorrow as she is still very weak. CM went over that going to a rehab would assist with the weakness but she refuses. She is home alone but states her son and daughter can check on her. No ambulation with PT yesterday. CM attempting to see who will have her today for PT to see how her activity is.  Pt asking to stay until Wednesday.  DME at home: walker/ shower seat/ oxygen. Pt manages her own medications. She states she may need her children to assist.  Pt uses family for transportation.  TOC following.   Expected Discharge Plan: [place] Barriers to Discharge: Continued Medical Work up  Expected Discharge Plan and Services Expected Discharge Plan: [place] Choice: [place] arrangements for the past 2 months: Single Family Home                           HH Arranged: PT, Nurse's Aide HH Agency: [facility] [place]: 06/08/22   Representative spoke with at [place]: [place] ([place]) Interventions    Readmission Risk Interventions    08/20/2020    1:20 PM  Readmission Risk Prevention Plan  Transportation Screening Complete  PCP or Specialist Appt within 3-5 Days Complete  HRI or [place] Complete  Social Work Consult for [place] Planning/Counseling Complete  Palliative Care Screening Not Applicable  Medication Review Press photographer) Complete

## 2022-06-08 NOTE — Progress Notes (Signed)
PROGRESS NOTE        PATIENT DETAILS Name: Tricia Gonzales Age: 81 y.o. Sex: female Date of Birth: 14-Dec-1940 Admit Date: 06/05/2022 Admitting Physician Rhetta Mura, DO QLR:[id number], Hal Hope, MD  Brief Summary: Patient is a 81 y.o.  female with history of chronic HFrEF, PAF on Eliquis, sick sinus syndrome-s/p PPM placement, chronic hypoxic respiratory failure on home O2, DM-2, CKD 4 who presented with subjective fever-was found to have PNA/COVID-19 infection, melena/GI bleed with acute blood loss anemia and AKI on CKD 4.  Significant events: 11/30-12/07>> hospitalization- acute on chronic HFrEF 12/15>> hospitalization for COVID infection/PNA/possible GI bleed/AKI on CKD 4.  Significant studies: 11/20>> echo: EF 81-59%, RV systolic function moderately reduced. 12/15>> CXR: Retrocardiac consolidation/atelectasis.  Significant microbiology data: 12/15>> COVID PCR: Positive (CT value 26.1) 12/15>> influenza A/B/RSV: Negative  Procedures: None  Consults: GI Cardiology  Subjective:   Patient in bed, appears comfortable, denies any headache, no fever, no chest pain or pressure, no shortness of breath , no abdominal pain. No focal weakness.  Objective: Vitals: Blood pressure (!) 134/57, pulse 94, temperature (!) 97.5 F (36.4 C), temperature source Oral, resp. rate 11, height _0  (1.473 m), weight 81.6 kg, SpO2 98 %.   Exam:  Awake Alert, No new F.N deficits, Normal affect [place].AT,PERRAL Supple Neck, No JVD,   Symmetrical Chest wall movement, Good air movement bilaterally, CTAB RRR,No Gallops, Rubs or new Murmurs,  +ve B.Sounds, Abd Soft, No tenderness,   No Cyanosis, Clubbing or edema    Assessment/Plan: Upper GI bleed with acute blood loss anemia No further melanotic stools since presentation (on iron supplementation at home) S/p 2 units of PRBC-this admission, currently stable continue to monitor. Continue PPI-appreciate GI input, likely  outpatient endoscopy procedures if needed.  Borderline low B12.  Placed on replacement.    AKI on CKD 4 AKI likely hemodynamically mediated due to GI bleed Supportive care Avoid nephrotoxic agents Watch closely-if worsens significantly-will obtain nephrology evaluation.  PNA Likely bacterial-perhaps aspiration Obtain SLP eval Although COVID-positive-procalcitonin is elevated-CXR more consistent with bacterial PNA-hence we will continue with antibiotics.  COVID-19 infection Remdesivir x 3 days Procalcitonin elevated Awaiting CRP Stable on 2 L of oxygen-this is her home regimen Hold off on steroids for now  Chronic HFrEF Volume status relatively stable Given ongoing GI bleed/AKI-diuretics on hold  PAF Rate controlled with Coreg Eliquis on hold, case discussed with Dr. Alessandra Bevels on 06/07/2022 will resume Eliquis in 2 days if no further bleeding. Monitor in telemetry.  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   Weight as of this encounter: 81.6 kg.   Code status:   Code Status: DNR reconfirmed with daughter/patient this morning.  DVT Prophylaxis: SCDs Start: 06/05/22 2151   Family Communication: Daughter-[name] updated over the phone on 12/16   Disposition Plan: Status is: Inpatient Remains inpatient appropriate because: Upper GI bleed-acute blood  loss anemia-PNA-COVID  infection-AKI-not yet stable for discharge.   Planned Discharge Destination:Home health   Diet: Diet Order             Diet clear liquid Room service appropriate? Yes; Fluid consistency: Thin  Diet effective now                    MEDICATIONS: Scheduled Meds:  carvedilol  12.5 mg Oral BID WC   cyanocobalamin  1,000 mcg Subcutaneous Daily   [START ON 06/10/2022] vitamin B-12  1,000 mcg Oral Daily   febuxostat  40 mg Oral Q M,W,F   insulin aspart  0-9 Units Subcutaneous Q6H   levothyroxine  75 mcg Oral QHS   montelukast  10 mg Oral QHS   [START ON 06/09/2022] pantoprazole  40 mg Intravenous Q12H   rOPINIRole  0.5 mg Oral QHS   rosuvastatin  5 mg Oral Daily   Continuous Infusions:  azithromycin 500 mg (06/07/22 1807)   cefTRIAXone (ROCEPHIN)  IV 1 g (06/07/22 1727)   pantoprazole 8 mg/hr (06/08/22 1104)   remdesivir 100 mg in sodium chloride 0.9 % 100 mL IVPB 100 mg (06/08/22 1101)   PRN Meds:.acetaminophen **OR** acetaminophen, fentaNYL (SUBLIMAZE) injection, naLOXone (NARCAN)  injection, ondansetron (ZOFRAN) IV   I have personally reviewed following labs and imaging studies  LABORATORY DATA:  Recent Labs  Lab 06/06/22 0139 06/06/22 0845 06/06/22 1030 06/06/22 1612 06/07/22 0340 06/08/22 0435  WBC 8.7  --  10.0 7.8 7.6 7.1  HGB 7.5* 7.0* 9.2* 8.3* 9.5* 8.7*  HCT 23.4* 20.6* 26.5* 25.7* 27.8* 23.5*  PLT 204  --  229 212 235 236  MCV 98.7  --  94.6 100.8* 97.5 102.6*  MCH 31.6  --  32.9 32.5 33.3 38.0*  MCHC 32.1  --  34.7 32.3 34.2 37.0*  RDW 17.3*  --  18.7* 19.7* 19.5* 21.7*  LYMPHSABS 0.2*  --   --   --   --  0.3*  MONOABS 0.4  --   --   --   --  0.6  EOSABS 0.0  --   --   --   --  0.2  BASOSABS 0.0  --   --   --   --  0.0    Recent Labs  Lab 06/02/22 1247 06/05/22 2018 06/06/22 0139 06/06/22 0657 06/06/22 0845 06/07/22 0340 06/08/22 0435  NA 136 137 138  --   --  138 136  K 3.5 3.1* 3.4* 3.4*  --  3.1* 4.2  CL 90* 90* 91*  --   --  94* 95*   CO2 33* 32 32  --   --  30 30  ANIONGAP _0 --   --  14 11  GLUCOSE 165* 160* 172*  --   --  166* 180*  BUN 115* 161* 160*  --   --  153* 153*  CREATININE 3.32* 4.10* 4.00*  --   --  3.83* 3.74*  AST _1 --   --   --   --   ALT _2 --   --   --   --   ALKPHOS 73 66 63  --   --   --   --   BILITOT 1.3* 1.3* 1.5*  --   --   --   --   ALBUMIN 3.1* 2.6* 2.5*  --   --  2.4*  --   CRP  --   --   --  14.4* 10.5*  --  9.9*  DDIMER  --   --   --  0.83*  --   --   --   PROCALCITON  --   --   --  1.73  --   --   --   INR  --  2.1*  --   --   --   --   --   BNP 2,414.4*  --   --   --   --   --  2,364.1*  MG  --   --  2.2  --   --   --  2.1  CALCIUM 8.9 8.5* 8.5*  --   --  8.5* 8.6*      Recent Labs  Lab 06/02/22 1247 06/05/22 2018 06/06/22 0139 06/06/22 0657 06/06/22 0845 06/07/22 0340 06/08/22 0435  CRP  --   --   --  14.4* 10.5*  --  9.9*  DDIMER  --   --   --  0.83*  --   --   --   PROCALCITON  --   --   --  1.73  --   --   --   INR  --  2.1*  --   --   --   --   --   BNP 2,414.4*  --   --   --   --   --  2,364.1*  MG  --   --  2.2  --   --   --  2.1  CALCIUM 8.9 8.5* 8.5*  --   --  8.5* 8.6*     RADIOLOGY STUDIES/RESULTS: DG Chest Port 1 View  Result Date: 06/07/2022 CLINICAL DATA:  81 year old female with shortness of breath. EXAM: PORTABLE CHEST 1 VIEW COMPARISON:  Portable chest 06/05/2022 and earlier. FINDINGS: Portable AP semi upright view at 0621 hours. The patient is more rotated to the left. Stable cardiomegaly and mediastinal contours. Stable left chest cardiac pacemaker. Left lung base hypo ventilation is stable. No pneumothorax or pulmonary edema. Probable small bilateral pleural effusions. Bilateral perihilar opacity persists, more apparent on the left but not significantly changed from 2 days ago. Visualized tracheal air column is within normal limits. No acute osseous abnormality identified. IMPRESSION: 1. Bilateral perihilar opacity not  significantly changed from 2 days ago. This is nonspecific, but more suspicious for perihilar edema or infection than atelectasis. 2. Otherwise stable Cardiomegaly with probable small bilateral pleural effusions. Electronically Signed   By: Genevie Ann M.D.   On: 06/07/2022 07:01     LOS: 3 days   Signature  -    Lala Lund M.D on 06/08/2022 at 11:11 AM   -  To page go to [email or web address]

## 2022-06-09 ENCOUNTER — Inpatient Hospital Stay (HOSPITAL_COMMUNITY): Payer: Medicare Other

## 2022-06-09 DIAGNOSIS — K922 Gastrointestinal hemorrhage, unspecified: Secondary | ICD-10-CM | POA: Diagnosis not present

## 2022-06-09 LAB — BASIC METABOLIC PANEL
Anion gap: 9 (ref 5–15)
BUN: 152 mg/dL — ABNORMAL HIGH (ref 8–23)
CO2: 30 mmol/L (ref 22–32)
Calcium: 8.2 mg/dL — ABNORMAL LOW (ref 8.9–10.3)
Chloride: 96 mmol/L — ABNORMAL LOW (ref 98–111)
Creatinine, Ser: 3.72 mg/dL — ABNORMAL HIGH (ref 0.44–1.00)
GFR, Estimated: 12 mL/min — ABNORMAL LOW (ref >60)
Glucose, Bld: 114 mg/dL — ABNORMAL HIGH (ref 70–99)
Potassium: 3.9 mmol/L (ref 3.5–5.1)
Sodium: 135 mmol/L (ref 135–145)

## 2022-06-09 LAB — GLUCOSE, CAPILLARY
Glucose-Capillary: 118 mg/dL — ABNORMAL HIGH (ref 70–99)
Glucose-Capillary: 181 mg/dL — ABNORMAL HIGH (ref 70–99)
Glucose-Capillary: 229 mg/dL — ABNORMAL HIGH (ref 70–99)

## 2022-06-09 LAB — CBC WITH DIFFERENTIAL/PLATELET
Abs Immature Granulocytes: 0.12 10*3/uL — ABNORMAL HIGH (ref 0.00–0.07)
Basophils Absolute: 0 10*3/uL (ref 0.0–0.1)
Basophils Relative: 0 %
Eosinophils Absolute: 0.2 10*3/uL (ref 0.0–0.5)
Eosinophils Relative: 4 %
HCT: 23.2 % — ABNORMAL LOW (ref 36.0–46.0)
Hemoglobin: 8 g/dL — ABNORMAL LOW (ref 12.0–15.0)
Immature Granulocytes: 2 %
Lymphocytes Relative: 6 %
Lymphs Abs: 0.4 10*3/uL — ABNORMAL LOW (ref 0.7–4.0)
MCH: 35.2 pg — ABNORMAL HIGH (ref 26.0–34.0)
MCHC: 34.5 g/dL (ref 30.0–36.0)
MCV: 102.2 fL — ABNORMAL HIGH (ref 80.0–100.0)
Monocytes Absolute: 0.7 10*3/uL (ref 0.1–1.0)
Monocytes Relative: 11 %
Neutro Abs: 5 10*3/uL (ref 1.7–7.7)
Neutrophils Relative %: 77 %
Platelets: 225 10*3/uL (ref 150–400)
RBC: 2.27 MIL/uL — ABNORMAL LOW (ref 3.87–5.11)
RDW: 20.1 % — ABNORMAL HIGH (ref 11.5–15.5)
WBC: 6.4 10*3/uL (ref 4.0–10.5)
nRBC: 0 % (ref 0.0–0.2)

## 2022-06-09 LAB — C-REACTIVE PROTEIN: CRP: 7.3 mg/dL — ABNORMAL HIGH (ref <1.0)

## 2022-06-09 LAB — BRAIN NATRIURETIC PEPTIDE: B Natriuretic Peptide: 2453.2 pg/mL — ABNORMAL HIGH (ref 0.0–100.0)

## 2022-06-09 LAB — MAGNESIUM: Magnesium: 2.1 mg/dL (ref 1.7–2.4)

## 2022-06-09 MED ORDER — AZITHROMYCIN 500 MG PO TABS
250.0000 mg | ORAL_TABLET | Freq: Every day | ORAL | Status: AC
  Administered 2022-06-09 – 2022-06-11 (×3): 250 mg via ORAL
  Filled 2022-06-09 (×3): qty 1

## 2022-06-09 MED ORDER — APIXABAN 2.5 MG PO TABS
2.5000 mg | ORAL_TABLET | Freq: Two times a day (BID) | ORAL | Status: DC
  Administered 2022-06-09 – 2022-06-14 (×12): 2.5 mg via ORAL
  Filled 2022-06-09 (×12): qty 1

## 2022-06-09 MED ORDER — FUROSEMIDE 40 MG PO TABS
40.0000 mg | ORAL_TABLET | Freq: Once | ORAL | Status: AC
  Administered 2022-06-09: 40 mg via ORAL
  Filled 2022-06-09: qty 1

## 2022-06-09 MED ORDER — HYDROCODONE-ACETAMINOPHEN 5-325 MG PO TABS
1.0000 | ORAL_TABLET | ORAL | Status: DC | PRN
  Administered 2022-06-09 – 2022-06-19 (×21): 1 via ORAL
  Filled 2022-06-09 (×22): qty 1

## 2022-06-09 MED ORDER — PANTOPRAZOLE SODIUM 40 MG PO TBEC
40.0000 mg | DELAYED_RELEASE_TABLET | Freq: Two times a day (BID) | ORAL | Status: DC
  Administered 2022-06-09 – 2022-06-19 (×22): 40 mg via ORAL
  Filled 2022-06-09 (×22): qty 1

## 2022-06-09 NOTE — Progress Notes (Signed)
Physical Therapy Treatment Patient Details Name: Tricia Gonzales MRN: [id number] DOB: 1940/10/17 Today's Date: 06/09/2022   History of Present Illness 81 yo female admitted with fever, general malaise, and dark stools; Covid +; GI on board; with medical history significant for chronic biventricular systolic heart failure, paroxysmal atrial fibrillation complicated by sick sinus syndrome status post pacemaker placement, chronically anticoagulated on Eliquis, chronic hypoxic respiratory failure on 2 L continuous nasal cannula, type 2 diabetes mellitus, CKD 4, anemia    PT Comments    Pt was seen for mobility on RW to stand and ck BP values, and then to monitor her control of balance.  Pt was tired before the standing ck, asking to return to bed.  Son is in visiting a covid positive room and asked him to put on a mask, which he is not wearing in the hallway either.  Follow up with her strengthening and endurance therapy needs, and encourage family to use all precautions given the uptick of UR infection cases.  Follow along with her for goals of PT on POC.   Recommendations for follow up therapy are one component of a multi-disciplinary discharge planning process, led by the attending physician.  Recommendations may be updated based on patient status, additional functional criteria and insurance authorization.  Follow Up Recommendations  Home health PT     Assistance Recommended at Discharge Intermittent Supervision/Assistance  Patient can return home with the following Assistance with cooking/housework;Assist for transportation;Help with stairs or ramp for entrance   Equipment Recommendations  None recommended by PT    Recommendations for Other Services       Precautions / Restrictions Precautions Precautions: Fall;Other (comment) Precaution Comments: Watch SpO2 (does not wear O2 baseline) COVID + Restrictions Weight Bearing Restrictions: No     Mobility  Bed Mobility Overal bed  mobility: Needs Assistance Bed Mobility: Sit to Supine       Sit to supine: Mod assist   General bed mobility comments: assisted entirely with LE's and with repositioning on bed including using bed pad to pulll up    Transfers Overall transfer level: Needs assistance Equipment used: Rolling walker (2 wheels) Transfers: Sit to/from Stand, Bed to chair/wheelchair/BSC Sit to Stand: Min assist   Step pivot transfers: Min assist            Ambulation/Gait               General Gait Details: declined over fatigue   Stairs             Wheelchair Mobility    Modified Rankin (Stroke Patients Only)       Balance Overall balance assessment: Needs assistance Sitting-balance support: Feet supported Sitting balance-Leahy Scale: Good     Standing balance support: Bilateral upper extremity supported, During functional activity Standing balance-Leahy Scale: Fair                              Cognition Arousal/Alertness: Awake/alert Behavior During Therapy: WFL for tasks assessed/performed Overall Cognitive Status: Within Functional Limits for tasks assessed                                          Exercises General Exercises - Lower Extremity Ankle Circles/Pumps: AAROM, 5 reps Quad Sets: AAROM, AROM, 10 reps Gluteal Sets: AROM, 10 reps Heel Slides: AAROM, 10 reps Hip  ABduction/ADduction: AAROM, AROM, 10 reps    General Comments General comments (skin integrity, edema, etc.): pt can stand at walker to get BP ck and noted her value was 111/99 standing      Pertinent Vitals/Pain Pain Assessment Pain Assessment: Faces Faces Pain Scale: Hurts a little bit Pain Location: L arm over BP check Pain Descriptors / Indicators: Guarding Pain Intervention(s): Monitored during session, Repositioned    Home Living                          Prior Function            PT Goals (current goals can now be found in the care  plan section) Acute Rehab PT Goals Patient Stated Goal: go home Progress towards PT goals: Not progressing toward goals - comment    Frequency    Min 3X/week      PT Plan Current plan remains appropriate    Co-evaluation              AM-PAC PT "6 Clicks" Mobility   Outcome Measure  Help needed turning from your back to your side while in a flat bed without using bedrails?: None Help needed moving from lying on your back to sitting on the side of a flat bed without using bedrails?: None Help needed moving to and from a bed to a chair (including a wheelchair)?: A Little Help needed standing up from a chair using your arms (e.g., wheelchair or bedside chair)?: A Little Help needed to walk in hospital room?: A Little Help needed climbing 3-5 steps with a railing? : A Lot 6 Click Score: 19    End of Session Equipment Utilized During Treatment: Gait belt;Oxygen Activity Tolerance: Patient limited by fatigue Patient left: in bed;with call bell/phone within reach;with bed alarm set;with family/visitor present Nurse Communication: Mobility status PT Visit Diagnosis: Other abnormalities of gait and mobility (R26.89)     Time: 5929-2446 PT Time Calculation (min) (ACUTE ONLY): 29 min  Charges:  $Therapeutic Exercise: 8-22 mins $Therapeutic Activity: 8-22 mins     Ramond Dial 06/09/2022, 4:50 PM  Mee Hives, PT PhD Acute Rehab Dept. Number: [place] and [place]

## 2022-06-09 NOTE — Progress Notes (Signed)
ANTICOAGULATION CONSULT NOTE - Initial Consult  Pharmacy Consult for Eliquis Indication: nonvalvular atrial fibrillation    Patient Measurements: Height: _0  (147.3 cm) Weight: 81.6 kg (180 lb) IBW/kg (Calculated) : 40.9   Vital Signs: Temp: 97.6 F (36.4 C) (12/19 0800) Temp Source: Oral (12/19 0800) BP: 120/56 (12/19 0800) Pulse Rate: 72 (12/19 0800)  Labs: Recent Labs    06/07/22 0340 06/08/22 0435 06/09/22 0507  HGB 9.5* 8.7* 8.0*  HCT 27.8* 23.5* 23.2*  PLT 235 236 225  CREATININE 3.83* 3.74* 3.72*    Estimated Creatinine Clearance: 10.9 mL/min (A) (by C-G formula based on SCr of 3.72 mg/dL (H)).   Medical History: 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   Anxiety    Arthritis    Atrial fibrillation (HCC)    Avascular necrosis (HCC) hip left   leg pain also   Chronic back pain    Chronic kidney disease (CKD), stage III (moderate) (HCC)    Depression    Diverticulosis    Early cataracts, bilateral    Esophageal stricture    Gastritis    GAVE (gastric antral vascular ectasia)    GERD (gastroesophageal reflux disease)    Glaucoma    Heart murmur     " some doctors say that I have one some say that I don"t "   Hiatal hernia    History of blood transfusion    "@ least w/1st knee OR"   History of gout    History of kidney stones    Hyperlipidemia    Hypertension    Intestinal obstruction ([place])    Kidney stones    Obesity    Osteoarthritis    Osteoporosis    Pericarditis 2016   Presence of permanent cardiac pacemaker    Sjogren's disease ([place])    Thyroid disease    Type II diabetes mellitus ([place])    Vitamin B12 deficiency     Medications:  Medications Prior to Admission  Medication Sig Dispense Refill Last Dose   acetaminophen (TYLENOL) 650 MG CR tablet Take 1,300 mg by mouth every evening.   Past Week   carvedilol (COREG) 12.5 MG tablet Take 18.75 mg by mouth 2 (two) times daily  with a meal.   Past Week at am   Cholecalciferol (VITAMIN D3) 25 MCG (1000 UT) CAPS Take 1,000 Units by mouth daily.   Past Week   cycloSPORINE (RESTASIS) 0.05 % ophthalmic emulsion Place 1 drop into both eyes 2 (two) times daily as needed (eye dryness).   unknown   dicyclomine (BENTYL) 10 MG capsule Take 1 capsule (10 mg total) by mouth every 8 (eight) hours as needed for spasms. 30 capsule 3 unknown   ELIQUIS 2.5 MG TABS tablet TAKE ONE TABLET TWICE DAILY 180 tablet 1 Past Week at am   febuxostat (ULORIC) 40 MG tablet Take 1 tablet (40 mg total) by mouth every Monday, Wednesday, and Friday. 12 tablet 0 Past Week   ferrous sulfate 325 (65 FE) MG EC tablet Take 325 mg by mouth daily with breakfast.   Past Week   gabapentin (NEURONTIN) 400 MG capsule Take 400 mg by mouth 2 (two) times daily.   Past Week   hydrALAZINE (APRESOLINE) 25 MG tablet Take 1 tablet (25 mg total) by mouth every 8 (eight) hours. 270 tablet 3 Past Week   isosorbide mononitrate (IMDUR) 30 MG 24 hr tablet Take 1 tablet (30 mg total) by  mouth daily. 90 tablet 3 Past Week   levothyroxine (SYNTHROID) 75 MCG tablet Take 75 mcg by mouth at bedtime.   Past Week   lidocaine (LIDODERM) 5 % Place 1-3 patches onto the skin See admin instructions. Apply 1-3 patches transdermally every 24 hours as needed for pain and remove & discard patches within 12 hours or as directed by MD   unknown   metolazone (ZAROXOLYN) 5 MG tablet Take 1 tablet (5 mg total) by mouth every Monday. 30 tablet 6 Past Week   montelukast (SINGULAIR) 10 MG tablet Take 1 tablet (10 mg total) by mouth at bedtime. 30 tablet 0 Past Week   nystatin (NYAMYC) powder APPLY TOPICALLY TO LEFT ABDOMINAL FOLD AND GROIN TWICE A DAY (Patient taking differently: Apply 1 Application topically daily as needed (Fungal infection). APPLY TOPICALLY TO LEFT ABDOMINAL FOLD AND GROIN) 15 g 0 unknown   ondansetron (ZOFRAN-ODT) 4 MG disintegrating tablet Take 1 tablet (4 mg total) by mouth every 8  (eight) hours as needed for nausea or vomiting. 30 tablet 1 unknown   pantoprazole (PROTONIX) 40 MG tablet Take 40 mg by mouth daily.   Past Week   pentoxifylline (TRENTAL) 400 MG CR tablet Take 400 mg by mouth daily. With a meal   Past Week   rOPINIRole (REQUIP) 0.5 MG tablet Take 0.5 mg by mouth at bedtime.   Past Week   rosuvastatin (CRESTOR) 5 MG tablet Take 1 tablet (5 mg total) by mouth daily. 30 tablet 0 Past Week   sennosides-docusate sodium (SENOKOT-S) 8.6-50 MG tablet Take 1 tablet by mouth daily.   Past Week   SF 5000 PLUS 1.1 % CREA dental cream Take by mouth as directed.   Past Week   SIMBRINZA 1-0.2 % SUSP Place 1 drop into the right eye 2 (two) times daily.   Past Week   torsemide (DEMADEX) 20 MG tablet Take 2 tablets (40 mg total) by mouth daily. 90 tablet 3 Past Week   traMADol (ULTRAM) 50 MG tablet Take 50 mg by mouth 2 (two) times daily.   Past Week   TRESIBA FLEXTOUCH 100 UNIT/ML FlexTouch Pen Inject 8-10 Units into the skin every evening.   Past Week   ACCU-CHEK AVIVA PLUS test strip 1 each by Other route daily.       BD PEN NEEDLE NANO U/F 32G X 4 MM MISC See admin instructions.       Assessment: 81 y.o female, past medical history includes PAF on Eliquis 2.5 mg BID prior to admission.  Presented on 06/05/22, found to have PNA/COVID-19 infection, melena/GI bleed with acute blood loss anemia and AKI on CKD 4.   No furterh melanotic stools since admission, s/p 2 units of PRBCs this admission.   GI recommends to dontinue on PPI and plan likely outpatient endoscopy procedure if needed  and okay to resume Eliquis.   Reduced dose apixaban 2.21m bid is appropriate dosing.   Plan:  Resume PTA Eliquis 2.5 mg po BID.  Continue to  monitor for s/sx of bleeding Pharmacy will sign off.   Thank you for allowing pharmacy to be part of this patients care team. RNicole Cella RMattawanaPharmacist [phone]/19/2023,11:55 AM

## 2022-06-09 NOTE — Discharge Instructions (Addendum)
Tricia Gonzales,  You were in the hospital with GI bleeding which is now stabilized. You also had worsening of your kidney function, which appears to be improved slightly, now. You have made it clear that you do not want hemodialysis, so should your kidneys completely fail, the recommendation is for a referral to hospice. For now, you will need to go to rehab to improve your strength. Prior to your discharge, you developed recurrent issues with urinary retention and will discharge with a urinary catheter. Please follow-up with the urologist for a voiding trial.   Information on my medicine - ELIQUIS (apixaban)  This medication education was reviewed with me or my healthcare representative as part of my discharge preparation.   You were taking this medication prior to this hospital admission.   Why was Eliquis prescribed for you? Eliquis was prescribed for you to reduce the risk of a blood clot forming that can cause a stroke if you have a medical condition called atrial fibrillation (a type of irregular heartbeat).  What do You need to know about Eliquis ? Take your Eliquis TWICE DAILY - one tablet in the morning and one tablet in the evening with or without food. If you have difficulty swallowing the tablet whole please discuss with your pharmacist how to take the medication safely.  Take Eliquis exactly as prescribed by your doctor and DO NOT stop taking Eliquis without talking to the doctor who prescribed the medication.  Stopping may increase your risk of developing a stroke.  Refill your prescription before you run out.  After discharge, you should have regular check-up appointments with your healthcare provider that is prescribing your Eliquis.  In the future your dose may need to be changed if your kidney function or weight changes by a significant amount or as you get older.  What do you do if you miss a dose? If you miss a dose, take it as soon as you remember on the same day and  resume taking twice daily.  Do not take more than one dose of ELIQUIS at the same time to make up a missed dose.  Important Safety Information A possible side effect of Eliquis is bleeding. You should call your healthcare provider right away if you experience any of the following: Bleeding from an injury or your nose that does not stop. Unusual colored urine (red or dark brown) or unusual colored stools (red or black). Unusual bruising for unknown reasons. A serious fall or if you hit your head (even if there is no bleeding).  Some medicines may interact with Eliquis and might increase your risk of bleeding or clotting while on Eliquis. To help avoid this, consult your healthcare provider or pharmacist prior to using any new prescription or non-prescription medications, including herbals, vitamins, non-steroidal anti-inflammatory drugs (NSAIDs) and supplements.  This website has more information on Eliquis (apixaban): http://www.eliquis.com/eliquis/home

## 2022-06-09 NOTE — Progress Notes (Signed)
PROGRESS NOTE        PATIENT DETAILS Name: Tricia Gonzales Age: 81 y.o. Sex: female Date of Birth: 12-Mar-1941 Admit Date: 06/05/2022 Admitting Physician Rhetta Mura, DO JSI:[id number], Hal Hope, MD  Brief Summary: Patient is a 81 y.o.  female with history of chronic HFrEF, PAF on Eliquis, sick sinus syndrome-s/p PPM placement, chronic hypoxic respiratory failure on home O2, DM-2, CKD 4 who presented with subjective fever-was found to have PNA/COVID-19 infection, melena/GI bleed with acute blood loss anemia and AKI on CKD 4.  Significant events: 11/30-12/07>> hospitalization- acute on chronic HFrEF 12/15>> hospitalization for COVID infection/PNA/possible GI bleed/AKI on CKD 4.  Significant studies: 11/20>> echo: EF 44-17%, RV systolic function moderately reduced. 12/15>> CXR: Retrocardiac consolidation/atelectasis.  Significant microbiology data: 12/15>> COVID PCR: Positive (CT value 26.1) 12/15>> influenza A/B/RSV: Negative  Procedures: None  Consults: GI Cardiology  Subjective:   Patient in bed, appears comfortable, denies any headache, no fever, no chest pain or pressure, no shortness of breath , no abdominal pain. No new focal weakness.   Objective: Vitals: Blood pressure (!) 120/56, pulse 72, temperature 97.6 F (36.4 C), temperature source Oral, resp. rate 18, height 4' 10" (1.473 m), weight 81.6 kg, SpO2 94 %.   Exam:  Awake Alert, No new F.N deficits, Normal affect [place].AT,PERRAL Supple Neck, No JVD,   Symmetrical Chest wall movement, Good air movement bilaterally, CTAB RRR,No Gallops, Rubs or new Murmurs,  +ve B.Sounds, Abd Soft, No tenderness,   No Cyanosis, Clubbing or edema     Assessment/Plan: Upper GI bleed with acute blood loss anemia No further melanotic stools since presentation (on iron supplementation at home) S/p 2 units of PRBC-this admission, currently stable continue to monitor. Continue PPI-appreciate GI input,  likely outpatient endoscopy procedures if needed.  As per GI will resume Eliquis on 06/09/2022 and monitor.  Borderline low B12.  Placed on replacement.    AKI on CKD 4 AKI likely hemodynamically mediated due to GI bleed Supportive care Avoid nephrotoxic agents Watch closely-if worsens significantly-will obtain nephrology evaluation.  PNA Likely bacterial-perhaps aspiration Obtain SLP eval Although COVID-positive-procalcitonin is elevated-CXR more consistent with bacterial PNA-hence we will continue with antibiotics.  COVID-19 infection Remdesivir x 3 days Procalcitonin elevated Awaiting CRP Stable on 2 L of oxygen-this is her home regimen Hold off on steroids for now  Chronic HFrEF Volume status relatively stable Given ongoing GI bleed/AKI-diuretics were held, low-dose Lasix x 1 on 06/09/2022.  PAF Rate controlled with Coreg Eliquis on hold, case discussed with Dr. Alessandra Bevels on 06/07/2022 will resume Eliquis in 2023.  Monitor in telemetry.  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   Weight as of this encounter: 81.6 kg.   Code status:   Code Status: DNR reconfirmed with daughter/patient this morning.  DVT Prophylaxis: SCDs Start: 06/05/22 2151   Family Communication: Daughter-[name]-[phone] updated over the phone on 12/16  Disposition Plan: Status is: Inpatient Remains inpatient  appropriate because: Upper GI bleed-acute blood loss anemia-PNA-COVID infection-AKI-not yet stable for discharge.   Planned Discharge Destination:Home health   Diet: Diet Order             Diet clear liquid Room service appropriate? Yes; Fluid consistency: Thin  Diet effective now                    MEDICATIONS: Scheduled Meds:  azithromycin  250 mg Oral Daily   carvedilol  12.5 mg Oral BID WC   [START ON 06/10/2022] vitamin B-12  1,000 mcg Oral Daily   febuxostat  40 mg Oral Q M,W,F   furosemide  40 mg Oral Once   insulin aspart  0-9 Units Subcutaneous Q6H   levothyroxine  75 mcg Oral QHS   montelukast  10 mg Oral QHS   pantoprazole  40 mg Oral BID AC   rOPINIRole  0.5 mg Oral QHS   rosuvastatin  5 mg Oral Daily   Continuous Infusions:   PRN Meds:.acetaminophen **OR** acetaminophen, fentaNYL (SUBLIMAZE) injection, HYDROcodone-acetaminophen, naLOXone (NARCAN)  injection, ondansetron (ZOFRAN) IV   I have personally reviewed following labs and imaging studies  LABORATORY DATA:  Recent Labs  Lab 06/06/22 0139 06/06/22 0845 06/06/22 1030 06/06/22 1612 06/07/22 0340 06/08/22 0435 06/09/22 0507  WBC 8.7  --  10.0 7.8 7.6 7.1 6.4  HGB 7.5*   < > 9.2* 8.3* 9.5* 8.7* 8.0*  HCT 23.4*   < > 26.5* 25.7* 27.8* 23.5* 23.2*  PLT 204  --  229 212 235 236 225  MCV 98.7  --  94.6 100.8* 97.5 102.6* 102.2*  MCH 31.6  --  32.9 32.5 33.3 38.0* 35.2*  MCHC 32.1  --  34.7 32.3 34.2 37.0* 34.5  RDW 17.3*  --  18.7* 19.7* 19.5* 21.7* 20.1*  LYMPHSABS 0.2*  --   --   --   --  0.3* 0.4*  MONOABS 0.4  --   --   --   --  0.6 0.7  EOSABS 0.0  --   --   --   --  0.2 0.2  BASOSABS 0.0  --   --   --   --  0.0 0.0   < > = values in this interval not displayed.    Recent Labs  Lab 06/02/22 1247 06/05/22 2018 06/06/22 0139 06/06/22 0657 06/06/22 0845 06/07/22 0340 06/08/22 0435 06/09/22 0507  NA 136 137 138  --   --  138 136 135  K 3.5 3.1* 3.4* 3.4*  --  3.1* 4.2 3.9  CL  90* 90* 91*  --   --  94* 95* 96*  CO2 33* 32 32  --   --  _0 ANIONGAP _1 --   --  _2 GLUCOSE 165* 160* 172*  --   --  166* 180* 114*  BUN 115* 161* 160*  --   --  153* 153* 152*  CREATININE 3.32* 4.10* 4.00*  --   --  3.83* 3.74* 3.72*  AST _3 --   --   --   --   --   ALT _4 --   --   --   --   --   ALKPHOS 73 66 63  --   --   --   --   --   BILITOT 1.3* 1.3* 1.5*  --   --   --   --   --  ALBUMIN 3.1* 2.6* 2.5*  --   --  2.4*  --   --   CRP  --   --   --  14.4* 10.5*  --  9.9* 7.3*  DDIMER  --   --   --  0.83*  --   --   --   --   PROCALCITON  --   --   --  1.73  --   --   --   --   INR  --  2.1*  --   --   --   --   --   --   BNP 2,414.4*  --   --   --   --   --  2,364.1* 2,453.2*  MG  --   --  2.2  --   --   --  2.1 2.1  CALCIUM 8.9 8.5* 8.5*  --   --  8.5* 8.6* 8.2*     Recent Labs  Lab 06/02/22 1247 06/05/22 2018 06/06/22 0139 06/06/22 0657 06/06/22 0845 06/07/22 0340 06/08/22 0435 06/09/22 0507  CRP  --   --   --  14.4* 10.5*  --  9.9* 7.3*  DDIMER  --   --   --  0.83*  --   --   --   --   PROCALCITON  --   --   --  1.73  --   --   --   --   INR  --  2.1*  --   --   --   --   --   --   BNP 2,414.4*  --   --   --   --   --  2,364.1* 2,453.2*  MG  --   --  2.2  --   --   --  2.1 2.1  CALCIUM 8.9 8.5* 8.5*  --   --  8.5* 8.6* 8.2*     RADIOLOGY STUDIES/RESULTS: DG Chest Port 1 View  Result Date: 06/09/2022 CLINICAL DATA:  81 year old female with shortness of breath. EXAM: PORTABLE CHEST 1 VIEW COMPARISON:  Portable chest 06/07/2022 and earlier. FINDINGS: Portable AP semi upright view at 0653 hours. Stable lung volumes. Stable cardiac size and mediastinal contours. Stable left chest pacemaker. Ongoing left lung base hypo ventilation and patchy perihilar opacity. This has not significantly changed since 06/05/2022. No pneumothorax or new pulmonary opacity. Paucity of bowel gas. Cholecystectomy clips. Stable visualized osseous structures.  IMPRESSION: 1. No significant change since 06/05/2022: perihilar opacity, left lung base hypoventilation in the setting of cardiomegaly. 2. No new cardiopulmonary abnormality. Electronically Signed   By: Genevie Ann M.D.   On: 06/09/2022 07:47     LOS: 4 days   Signature  -    Lala Lund M.D on 06/09/2022 at 11:04 AM   -  To page go to [email or web address]

## 2022-06-09 NOTE — Progress Notes (Signed)
Heart Failure Navigator Progress Note  Assessed for Heart & Vascular TOC clinic readiness.  Patient has a [place] appointment scheduled for 06/19/22. Was just seen in HF TOC on 06/02/22..   Navigator will sign off at this time.   Earnestine Leys, BSN, Clinical cytogeneticist Only

## 2022-06-09 NOTE — TOC Progression Note (Signed)
Transition of Care [facility]) - Progression Note    Patient Details  Name: Tricia Gonzales MRN: [id number] Date of Birth: [birth year]  [facility]) CM/SW [place], LCSW Phone Number: 06/09/2022, 5:44 PM  Clinical Narrative:    CSW stopped by patient's son in the hallway. He requested SNF placement for patient as she was incontinent in the bed. He requested CSW call his sister Nira Conn for further info.  CSW spoke with Nira Conn. She stated patient is requesting [facility]. CSW explained they do not often have beds and that we will have to locate a SNF that accepts COVID patients but will send the referral. She expressed concern about patient having dark stool that appeared like how she was when she had to come to the hospital. She stated she will discuss with RN as well. CSW will alert MD in morning rounds. SNF workup done. Will present bed offers as available.    Expected Discharge Plan: [place] Barriers to Discharge: Insurance Authorization, SNF Covid, SNF Pending bed offer  Expected Discharge Plan and Services Expected Discharge Plan: [place] In-house Referral: Clinical Social Work   Post Acute Care Choice: [place] arrangements for the past 2 months: [place]: PT, Nurse's Aide [place] Agency: [place] Date [place]: 06/08/22   Representative spoke with at [place]: [place]) Interventions    Readmission Risk Interventions    08/20/2020    1:20 PM  Readmission Risk Prevention Plan  Transportation Screening Complete  PCP or Specialist Appt within 3-5 Days Complete  HRI or [place] Complete  Social Work Consult for [place] Planning/Counseling Complete  Palliative Care Screening Not Applicable  Medication Review Press photographer) Complete

## 2022-06-09 NOTE — NC FL2 (Signed)
[place] LEVEL OF CARE FORM     IDENTIFICATION  Patient Name: Tricia Gonzales Birthdate: Feb 24, 1941 Sex: female Admission Date (Current Location): 06/05/2022  [facility] and [place] Number:  Herbalist and Address:  [place] [address] [place], [place] [address]      Provider Number: [id number]  Attending Physician Name and Address:  Thurnell Lose, MD  Relative Name and Phone Number:       Current Level of Care: Hospital Recommended Level of Care: [place] Prior Approval Number:    Date Approved/Denied:   PASRR Number: [id number]  Discharge Plan: SNF    Current Diagnoses: Patient Active Problem List   Diagnosis Date Noted   CAP (community acquired pneumonia) 06/06/2022   Hypokalemia 94/49/6759   Chronic systolic CHF (congestive heart failure) ([place]) 06/06/2022   Acquired hypothyroidism 06/06/2022   Acute upper GI bleed 06/05/2022   Pulmonary hypertension, unspecified ([place]) 05/28/2022   Mitral regurgitation 05/28/2022   Tricuspid regurgitation 05/28/2022   RVF (right ventricular failure) ([place]) 16/38/4665   Acute systolic heart failure ([place]) 05/22/2022   Benign esophageal stricture    Acute blood loss anemia    Symptomatic anemia    Acute diastolic heart failure ([place]) 08/15/2020   Acute renal failure superimposed on stage 4 chronic kidney disease ([place]) 08/15/2020   CHF (congestive heart failure) ([place]) 08/15/2020   Atrial flutter (HCC)    Sinus node dysfunction ([place]) 09/06/2019   Status post left hip replacement 06/28/2019   Urinary retention 05/12/2019   Polyneuropathy associated with underlying disease ([place]) 05/12/2019   Immobility 04/26/2019   Low ferritin 04/24/2019   Primary osteoarthritis of left hip 03/29/2019   GAVE (gastric antral vascular ectasia)    Avascular necrosis of bone of hip, left ([place]) 08/22/2018   Vitamin B12 deficiency    DM2 (diabetes mellitus, type 2) (HCC)     Sjogren's disease ([place])    Presence of permanent cardiac pacemaker    Pericarditis    Osteoarthritis    Obesity    Kidney stones    Intestinal obstruction (HCC)    History of gout    Hiatal hernia    History of blood transfusion    Heart murmur    GERD (gastroesophageal reflux disease)    Gastritis    Esophageal stricture    Early cataracts, bilateral    Diverticulosis    Depression    Chronic kidney disease (CKD), stage III (moderate) (HCC)    Chronic back pain    Paroxysmal atrial fibrillation (HCC)    Arthritis    Anxiety    Anemia    Osteoarthritis of finger of right hand 01/22/2016   Pain 01/22/2016   Primary osteoarthritis of both first carpometacarpal joints 01/22/2016   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/2015   Chest pain at rest    Heme positive stool    UTI (urinary tract infection) 08/09/2015   Acute pericarditis    Chest pain 08/06/2015   Diabetes mellitus type 2, controlled ([place]) 08/06/2015   HLD (hyperlipidemia) 08/06/2015   Hypertension 08/06/2015   CKD (chronic kidney disease) stage 3, GFR 30-59 ml/min (HCC) 08/06/2015   Chronic anemia 08/06/2015   Hyperkalemia    Pain in the chest    Pacemaker    Junctional bradycardia 06/17/2015   Osteoporosis 03/22/2015   Abdominal pain, left lower quadrant 01/07/2012   Syncope 09/15/2011   Junctional escape rhythm 09/15/2011   PELVIC MASS 03/27/2009  ABSCESS OF INTESTINE 02/19/2009   VITAMIN B12 DEFICIENCY 11/21/2007   OBESITY 11/21/2007   ESOPHAGEAL STRICTURE 11/21/2007   HIATAL HERNIA 11/21/2007   UNSPECIFIED INTESTINAL OBSTRUCTION 11/21/2007   DIVERTICULOSIS OF COLON 11/21/2007   OSTEOARTHRITIS 11/21/2007   DIABETES MELLITUS, HX OF 11/21/2007   ANEMIA, HX OF 11/21/2007   HYPERTENSION, HX OF 11/21/2007   REFLUX ESOPHAGITIS, HX OF 11/21/2007    Orientation RESPIRATION BLADDER Height & Weight     Self, Time, Situation, Place  O2 (3L nasal cannula) Continent Weight: 180 lb (81.6 kg) Height:  4'  10" (147.3 cm)  BEHAVIORAL SYMPTOMS/MOOD NEUROLOGICAL BOWEL NUTRITION STATUS      Continent Diet (See dc summary)  AMBULATORY STATUS COMMUNICATION OF NEEDS Skin   Limited Assist Verbally Normal                       Personal Care Assistance Level of Assistance  Bathing, Feeding, Dressing Bathing Assistance: Limited assistance Feeding assistance: Independent Dressing Assistance: Limited assistance     Functional Limitations Info  Hearing   Hearing Info: Impaired      SPECIAL CARE FACTORS FREQUENCY  PT (By licensed PT), OT (By licensed OT)     PT Frequency: 5x/week OT Frequency: 5x/week            Contractures Contractures Info: Not present    Additional Factors Info  Code Status, Allergies, Isolation Precautions Code Status Info: DNR Allergies Info: Norvasc (Amlodipine Besylate), Other, Amlodipine Besylate, Avapro (Irbesartan), Glucophage (Metformin Hydrochloride), Lisinopril, Losartan, Lumigan (Bimatoprost), Morphine And Related, Simvastatin, Trulicity (Dulaglutide)     Isolation Precautions Info: [id number]     Current Medications (06/09/2022):  This is the current hospital active medication list Current Facility-Administered Medications  Medication Dose Route Frequency Provider Last Rate Last Admin   acetaminophen (TYLENOL) tablet 650 mg  650 mg Oral Q6H PRN Howerter, Justin B, DO   650 mg at 06/06/22 4287   Or   acetaminophen (TYLENOL) suppository 650 mg  650 mg Rectal Q6H PRN Howerter, Justin B, DO       apixaban (ELIQUIS) tablet 2.5 mg  2.5 mg Oral BID Wendee Beavers, RPH   2.5 mg at 06/09/22 1413   azithromycin (ZITHROMAX) tablet 250 mg  250 mg Oral Daily Lala Lund K, MD   250 mg at 06/09/22 1026   carvedilol (COREG) tablet 12.5 mg  12.5 mg Oral BID WC Ghimire, Shanker M, MD   12.5 mg at 06/09/22 1025   [START ON 06/10/2022] cyanocobalamin (VITAMIN B12) tablet 1,000 mcg  1,000 mcg Oral Daily Thurnell Lose, MD       febuxostat (ULORIC) tablet 40  mg  40 mg Oral Q M,W,F Ghimire, Shanker M, MD   40 mg at 06/08/22 1102   fentaNYL (SUBLIMAZE) injection 25 mcg  25 mcg Intravenous Q2H PRN Howerter, Justin B, DO   25 mcg at 06/08/22 2112   HYDROcodone-acetaminophen (NORCO/VICODIN) 5-325 MG per tablet 1 tablet  1 tablet Oral Q4H PRN Quintella Baton, MD   1 tablet at 06/09/22 0034   insulin aspart (novoLOG) injection 0-9 Units  0-9 Units Subcutaneous Q6H Howerter, Justin B, DO   2 Units at 06/09/22 1413   levothyroxine (SYNTHROID) tablet 75 mcg  75 mcg Oral QHS Jonetta Osgood, MD   75 mcg at 06/08/22 0618   montelukast (SINGULAIR) tablet 10 mg  10 mg Oral QHS Jonetta Osgood, MD   10 mg at 06/08/22 2112   naloxone [facility]) injection  0.4 mg  0.4 mg Intravenous PRN Howerter, Justin B, DO       ondansetron (ZOFRAN) injection 4 mg  4 mg Intravenous Q6H PRN Howerter, Justin B, DO   4 mg at 06/06/22 1254   pantoprazole (PROTONIX) EC tablet 40 mg  40 mg Oral BID AC Thurnell Lose, MD   40 mg at 06/09/22 1025   rOPINIRole (REQUIP) tablet 0.5 mg  0.5 mg Oral QHS Ghimire, Henreitta Leber, MD   0.5 mg at 06/08/22 2111   rosuvastatin (CRESTOR) tablet 5 mg  5 mg Oral Daily Jonetta Osgood, MD   5 mg at 06/09/22 1026     Discharge Medications: Please see discharge summary for a list of discharge medications.  Relevant Imaging Results:  Relevant Lab Results:   Additional Information SSN: [id number]  Benard Halsted, LCSW

## 2022-06-10 ENCOUNTER — Inpatient Hospital Stay (HOSPITAL_COMMUNITY): Payer: Medicare Other

## 2022-06-10 DIAGNOSIS — K922 Gastrointestinal hemorrhage, unspecified: Secondary | ICD-10-CM | POA: Diagnosis not present

## 2022-06-10 LAB — CBC WITH DIFFERENTIAL/PLATELET
Abs Immature Granulocytes: 0.14 10*3/uL — ABNORMAL HIGH (ref 0.00–0.07)
Basophils Absolute: 0 10*3/uL (ref 0.0–0.1)
Basophils Relative: 0 %
Eosinophils Absolute: 0.2 10*3/uL (ref 0.0–0.5)
Eosinophils Relative: 3 %
HCT: 23.1 % — ABNORMAL LOW (ref 36.0–46.0)
Hemoglobin: 7.7 g/dL — ABNORMAL LOW (ref 12.0–15.0)
Immature Granulocytes: 2 %
Lymphocytes Relative: 7 %
Lymphs Abs: 0.4 10*3/uL — ABNORMAL LOW (ref 0.7–4.0)
MCH: 35 pg — ABNORMAL HIGH (ref 26.0–34.0)
MCHC: 33.3 g/dL (ref 30.0–36.0)
MCV: 105 fL — ABNORMAL HIGH (ref 80.0–100.0)
Monocytes Absolute: 0.6 10*3/uL (ref 0.1–1.0)
Monocytes Relative: 9 %
Neutro Abs: 5.1 10*3/uL (ref 1.7–7.7)
Neutrophils Relative %: 79 %
Platelets: 258 10*3/uL (ref 150–400)
RBC: 2.2 MIL/uL — ABNORMAL LOW (ref 3.87–5.11)
RDW: 20.6 % — ABNORMAL HIGH (ref 11.5–15.5)
WBC: 6.5 10*3/uL (ref 4.0–10.5)
nRBC: 0 % (ref 0.0–0.2)

## 2022-06-10 LAB — TYPE AND SCREEN
ABO/RH(D): A POS
Antibody Screen: NEGATIVE
PT AG Type: POSITIVE
Unit division: 0
Unit division: 0

## 2022-06-10 LAB — OSMOLALITY: Osmolality: 337 mOsm/kg (ref 275–295)

## 2022-06-10 LAB — BPAM RBC
Blood Product Expiration Date: 202401132359
Blood Product Expiration Date: 202401132359
ISSUE DATE / TIME: 202312152230
ISSUE DATE / TIME: 202312160227
Unit Type and Rh: 6200
Unit Type and Rh: 6200

## 2022-06-10 LAB — GLUCOSE, CAPILLARY
Glucose-Capillary: 176 mg/dL — ABNORMAL HIGH (ref 70–99)
Glucose-Capillary: 179 mg/dL — ABNORMAL HIGH (ref 70–99)
Glucose-Capillary: 201 mg/dL — ABNORMAL HIGH (ref 70–99)
Glucose-Capillary: 203 mg/dL — ABNORMAL HIGH (ref 70–99)
Glucose-Capillary: 253 mg/dL — ABNORMAL HIGH (ref 70–99)

## 2022-06-10 LAB — MAGNESIUM: Magnesium: 2.2 mg/dL (ref 1.7–2.4)

## 2022-06-10 LAB — BASIC METABOLIC PANEL
Anion gap: 11 (ref 5–15)
BUN: 153 mg/dL — ABNORMAL HIGH (ref 8–23)
CO2: 29 mmol/L (ref 22–32)
Calcium: 8.2 mg/dL — ABNORMAL LOW (ref 8.9–10.3)
Chloride: 94 mmol/L — ABNORMAL LOW (ref 98–111)
Creatinine, Ser: 3.87 mg/dL — ABNORMAL HIGH (ref 0.44–1.00)
GFR, Estimated: 11 mL/min — ABNORMAL LOW (ref >60)
Glucose, Bld: 184 mg/dL — ABNORMAL HIGH (ref 70–99)
Potassium: 4 mmol/L (ref 3.5–5.1)
Sodium: 134 mmol/L — ABNORMAL LOW (ref 135–145)

## 2022-06-10 LAB — OSMOLALITY, URINE: Osmolality, Ur: 338 mOsm/kg (ref 300–900)

## 2022-06-10 LAB — SODIUM, URINE, RANDOM: Sodium, Ur: 31 mmol/L

## 2022-06-10 LAB — PREPARE RBC (CROSSMATCH)

## 2022-06-10 LAB — URIC ACID: Uric Acid, Serum: 9.3 mg/dL — ABNORMAL HIGH (ref 2.5–7.1)

## 2022-06-10 LAB — BRAIN NATRIURETIC PEPTIDE: B Natriuretic Peptide: 2019.1 pg/mL — ABNORMAL HIGH (ref 0.0–100.0)

## 2022-06-10 LAB — CREATININE, URINE, RANDOM: Creatinine, Urine: 65 mg/dL

## 2022-06-10 LAB — C-REACTIVE PROTEIN: CRP: 5.9 mg/dL — ABNORMAL HIGH (ref <1.0)

## 2022-06-10 MED ORDER — LACTATED RINGERS IV SOLN: INTRAVENOUS | Status: DC

## 2022-06-10 MED ORDER — SODIUM CHLORIDE 0.9% IV SOLUTION: Freq: Once | INTRAVENOUS | Status: AC

## 2022-06-10 MED ORDER — ADULT MULTIVITAMIN W/MINERALS CH
1.0000 | ORAL_TABLET | Freq: Every day | ORAL | Status: DC
  Administered 2022-06-10 – 2022-06-19 (×10): 1 via ORAL
  Filled 2022-06-10 (×10): qty 1

## 2022-06-10 MED ORDER — TAMSULOSIN HCL 0.4 MG PO CAPS
0.4000 mg | ORAL_CAPSULE | Freq: Every day | ORAL | Status: DC
  Administered 2022-06-10 – 2022-06-19 (×10): 0.4 mg via ORAL
  Filled 2022-06-10 (×10): qty 1

## 2022-06-10 NOTE — Progress Notes (Signed)
PROGRESS NOTE        PATIENT DETAILS Name: Tricia Gonzales Age: 81 y.o. Sex: female Date of Birth: [birth year] Admit Date: 06/05/2022 Admitting Physician Rhetta Mura, DO TWS:[email or web address], Tricia Hope, MD  Brief Summary: Patient is a 81 y.o.  female with history of chronic HFrEF, PAF on Eliquis, sick sinus syndrome-s/p PPM placement, chronic hypoxic respiratory failure on home O2, DM-2, CKD 4 who presented with subjective fever-was found to have PNA/COVID-19 infection, melena/GI bleed with acute blood loss anemia and AKI on CKD 4.  Significant events: 11/30-12/07>> hospitalization- acute on chronic HFrEF 12/15>> hospitalization for COVID infection/PNA/possible GI bleed/AKI on CKD 4.  Significant studies: 11/20>> echo: EF 75-17%, RV systolic function moderately reduced. 12/15>> CXR: Retrocardiac consolidation/atelectasis.  Significant microbiology data: 12/15>> COVID PCR: Positive (CT value 26.1) 12/15>> influenza A/B/RSV: Negative  Procedures: None  Consults: GI Cardiology  Subjective:   Patient in bed, appears comfortable, denies any headache, no fever, no chest pain or pressure, no shortness of breath , no abdominal pain. No new focal weakness.   Objective: Vitals: Blood pressure (!) 116/92, pulse 66, temperature (!) 97.5 F (36.4 C), temperature source Axillary, resp. rate 14, height _0  (1.473 m), weight 81.6 kg, SpO2 100 %.   Exam:  Awake Alert, No new F.N deficits, Normal affect [place].AT,PERRAL Supple Neck, No JVD,   Symmetrical Chest wall movement, Good air movement bilaterally, CTAB RRR,No Gallops, Rubs or new Murmurs,  +ve B.Sounds, Abd Soft, No tenderness,   No Cyanosis, Clubbing or edema     Assessment/Plan: Upper GI bleed with acute blood loss anemia No further melanotic stools since presentation (on iron supplementation at home) S/p 2 units of PRBC-this admission, currently stable continue to monitor.  Giving another unit on  06/10/2022 due to AKI, H&H relatively stable. Continue PPI-seen by [facility] no endoscopy is here so far.  Resumed Eliquis 06/09/2022 as per [facility] GI Dr. Leanna Sato instructions. Closely monitoring CBC and H&H, if drops further we will reconsult GI and hold Eliquis.   Borderline low B12.  Placed on replacement.    AKI on CKD 4 AKI likely hemodynamically mediated due to GI bleed Continue to hold nephrotoxins, 1 unit packed RBC on 06/10/2022, renal ultrasound, some evidence of urinary retention on bladder scan, add Flomax, check urine electrolytes and monitor.   PNA Likely bacterial-perhaps aspiration Obtain SLP eval Although COVID-positive-procalcitonin is elevated-CXR more consistent with bacterial PNA-hence we will continue with antibiotics.   COVID-19 infection Remdesivir x 3 days Procalcitonin elevated Awaiting CRP Stable on 2 L of oxygen-this is her home regimen Hold off on steroids for now   Chronic HFrEF Volume status relatively stable Given ongoing GI bleed/AKI-diuretics were held, low-dose Lasix x 1 on 06/09/2022.   PAF Rate controlled with Coreg Eliquis on hold, case discussed with Dr. Alessandra Bevels on 06/07/2022 will resume Eliquis in 2023.  Monitor in telemetry.  History of sick sinus syndrome/complete heart block-s/p PPM placement Telemetry monitoring  Group 2 pulmonary hypertension Moderate-severe MR Severe TR Chronic hypoxic respiratory failure on 2 L of oxygen at home Telemetry monitoring Chronic issues Cardiology following  Hypokalemia Replete/recheck  HTN BP stable Continue Coreg cautiously Imdur/hydralazine on hold-due to ongoing GI bleed  HLD Continue statin  Hypothyroidism Continue Synthroid Recent TSH 12/1 stable  Obesity: Estimated body mass index is 37.62 kg/m as calculated from the following:   Height  as of this encounter: _0  (1.473 m).   Weight as of this encounter: 81.6 kg.    Code status:   Code Status: DNR reconfirmed  with daughter/patient this morning.  DVT Prophylaxis: apixaban (ELIQUIS) tablet 2.5 mg Start: 06/09/22 1245 SCDs Start: 06/05/22 2151 apixaban (ELIQUIS) tablet 2.5 mg    Family Communication:   Daughter-Tricia [phone] updated over the phone on 06/10/22   Disposition Plan: Status is: Inpatient Remains inpatient appropriate because: Upper GI bleed-acute blood loss anemia-PNA-COVID infection-AKI-not yet stable for discharge.   Planned Discharge Destination:Home health   Diet: Diet Order             DIET SOFT Room service appropriate? Yes with Assist; Fluid consistency: Thin  Diet effective now                    MEDICATIONS: Scheduled Meds:  sodium chloride   Intravenous Once   apixaban  2.5 mg Oral BID   azithromycin  250 mg Oral Daily   carvedilol  12.5 mg Oral BID WC   vitamin B-12  1,000 mcg Oral Daily   febuxostat  40 mg Oral Q M,W,F   insulin aspart  0-9 Units Subcutaneous Q6H   levothyroxine  75 mcg Oral QHS   montelukast  10 mg Oral QHS   pantoprazole  40 mg Oral BID AC   rOPINIRole  0.5 mg Oral QHS   rosuvastatin  5 mg Oral Daily   tamsulosin  0.4 mg Oral Daily   Continuous Infusions:   PRN Meds:.acetaminophen **OR** acetaminophen, fentaNYL (SUBLIMAZE) injection, HYDROcodone-acetaminophen, naLOXone (NARCAN)  injection, ondansetron (ZOFRAN) IV   I have personally reviewed following labs and imaging studies  LABORATORY DATA:  Recent Labs  Lab 06/06/22 0139 06/06/22 0845 06/06/22 1612 06/07/22 0340 06/08/22 0435 06/09/22 0507 06/10/22 0325  WBC 8.7   < > 7.8 7.6 7.1 6.4 6.5  HGB 7.5*   < > 8.3* 9.5* 8.7* 8.0* 7.7*  HCT 23.4*   < > 25.7* 27.8* 23.5* 23.2* 23.1*  PLT 204   < > 212 235 236 225 258  MCV 98.7   < > 100.8* 97.5 102.6* 102.2* 105.0*  MCH 31.6   < > 32.5 33.3 38.0* 35.2* 35.0*  MCHC 32.1   < > 32.3 34.2 37.0* 34.5 33.3  RDW 17.3*   < > 19.7* 19.5* 21.7* 20.1* 20.6*  LYMPHSABS 0.2*  --   --   --  0.3* 0.4* 0.4*  MONOABS 0.4   --   --   --  0.6 0.7 0.6  EOSABS 0.0  --   --   --  0.2 0.2 0.2  BASOSABS 0.0  --   --   --  0.0 0.0 0.0   < > = values in this interval not displayed.    Recent Labs  Lab 06/05/22 2018 06/06/22 0139 06/06/22 0657 06/06/22 0845 06/07/22 0340 06/08/22 0435 06/09/22 0507 06/10/22 0325  NA 137 138  --   --  138 136 135 134*  K 3.1* 3.4* 3.4*  --  3.1* 4.2 3.9 4.0  CL 90* 91*  --   --  94* 95* 96* 94*  CO2 32 32  --   --  _1 ANIONGAP 15 15  --   --  _2 GLUCOSE 160* 172*  --   --  166* 180* 114* 184*  BUN 161* 160*  --   --  153* 153* 152* 153*  CREATININE  4.10* 4.00*  --   --  3.83* 3.74* 3.72* 3.87*  AST 24 24  --   --   --   --   --   --   ALT 12 12  --   --   --   --   --   --   ALKPHOS 66 63  --   --   --   --   --   --   BILITOT 1.3* 1.5*  --   --   --   --   --   --   ALBUMIN 2.6* 2.5*  --   --  2.4*  --   --   --   CRP  --   --  14.4* 10.5*  --  9.9* 7.3* 5.9*  DDIMER  --   --  0.83*  --   --   --   --   --   PROCALCITON  --   --  1.73  --   --   --   --   --   INR 2.1*  --   --   --   --   --   --   --   BNP  --   --   --   --   --  2,364.1* 2,453.2* 2,019.1*  MG  --  2.2  --   --   --  2.1 2.1 2.2  CALCIUM 8.5* 8.5*  --   --  8.5* 8.6* 8.2* 8.2*     Recent Labs  Lab 06/05/22 2018 06/06/22 0139 06/06/22 0657 06/06/22 0845 06/07/22 0340 06/08/22 0435 06/09/22 0507 06/10/22 0325  CRP  --   --  14.4* 10.5*  --  9.9* 7.3* 5.9*  DDIMER  --   --  0.83*  --   --   --   --   --   PROCALCITON  --   --  1.73  --   --   --   --   --   INR 2.1*  --   --   --   --   --   --   --   BNP  --   --   --   --   --  2,364.1* 2,453.2* 2,019.1*  MG  --  2.2  --   --   --  2.1 2.1 2.2  CALCIUM 8.5* 8.5*  --   --  8.5* 8.6* 8.2* 8.2*     RADIOLOGY STUDIES/RESULTS: DG Chest Port 1 View  Result Date: 06/09/2022 CLINICAL DATA:  81 year old female with shortness of breath. EXAM: PORTABLE CHEST 1 VIEW COMPARISON:  Portable chest 06/07/2022 and earlier.  FINDINGS: Portable AP semi upright view at 0653 hours. Stable lung volumes. Stable cardiac size and mediastinal contours. Stable left chest pacemaker. Ongoing left lung base hypo ventilation and patchy perihilar opacity. This has not significantly changed since 06/05/2022. No pneumothorax or new pulmonary opacity. Paucity of bowel gas. Cholecystectomy clips. Stable visualized osseous structures. IMPRESSION: 1. No significant change since 06/05/2022: perihilar opacity, left lung base hypoventilation in the setting of cardiomegaly. 2. No new cardiopulmonary abnormality. Electronically Signed   By: Genevie Ann M.D.   On: 06/09/2022 07:47     LOS: 5 days   Signature  -    Lala Lund M.D on 06/10/2022 at 11:03 AM   -  To page go to [email or web address]

## 2022-06-10 NOTE — Progress Notes (Signed)
Initial Nutrition Assessment  DOCUMENTATION CODES:   Obesity unspecified  INTERVENTION:   Diet advancement to Soft per MD Multivitamin w/ minerals daily Encourage good PO intake  NUTRITION DIAGNOSIS:   Increased nutrient needs related to acute illness as evidenced by estimated needs.  GOAL:   Patient will meet greater than or equal to 90% of their needs  MONITOR:   PO intake, Labs, Weight trends, I & O's  REASON FOR ASSESSMENT:   Malnutrition Screening Tool    ASSESSMENT:   81 y.o. female admits related to subjective fever. PMH includes: heart failure, afib, T2DM, respiratory failure, CKD stage 4. Pt is currently receiving medical management for upper GI bleed with acute blood loss anemia and COVID.   Pt laying in bed, reports that she was excited to eat some solid foods this afternoon. States that she took it slow to not upset her stomach. Denies any vomiting, but endorses nausea very frequently currently and at home. Reports that her appetite has been poor for about 4 weeks at home PTA. States that she [place] unsure about any weight loss due to being on a diuretic. Pt does not like Ensures, RD encouraged pt to continue to each as much as possible. Denies any changes in taste or smell.   Discussed advancing diet with MD, MD agreeable to Soft diet.   Medications reviewed and include: Zithromax, Vitamin B12, NovoLog SSI, Protonix Labs reviewed: Sodium 134, BUN 153, Creatinine 3.87, Hgb A1c 7.7%, 24 hr CBGs 118-229  NUTRITION - FOCUSED PHYSICAL EXAM:  Deferred to follow-up, pt needing to use restroom  Diet Order:   Diet Order             DIET SOFT Room service appropriate? Yes with Assist; Fluid consistency: Thin  Diet effective now                   EDUCATION NEEDS:   No education needs have been identified at this time  Skin:  Skin Assessment: Reviewed RN Assessment  Last BM:  12/19  Height:   Ht Readings from Last 1 Encounters:  06/05/22 _0  (1.473  m)    Weight:   Wt Readings from Last 1 Encounters:  06/05/22 81.6 kg    BMI:  Body mass index is 37.62 kg/m.  Estimated Nutritional Needs:   Kcal:  1700-1900  Protein:  85-100 grams  Fluid:  >/= 1.7 L    Hermina Barters RD, LDN Clinical Dietitian See [facility] for contact information.

## 2022-06-10 NOTE — Progress Notes (Signed)
Occupational Therapy Treatment Patient Details Name: Tricia Gonzales MRN: [id number] DOB: Nov 27, 1940 Today's Date: 06/10/2022   History of present illness 81 yo female admitted with fever, general malaise, and dark stools; Covid +; GI on board; with medical history significant for chronic biventricular systolic heart failure, paroxysmal atrial fibrillation complicated by sick sinus syndrome status post pacemaker placement, chronically anticoagulated on Eliquis, chronic hypoxic respiratory failure on 2 L continuous nasal cannula, type 2 diabetes mellitus, CKD 4, anemia   OT comments  Satrina is making incremental progress. Upon arrival, pt reporting general fatigue from family visiting all day but still agreeable to participate. Overall she needed mod A for bed mobiltiy and min A for 3x sit<>stand transfers. Pt able to take lateral steps along the EOB with min G and RW. Prolonged sitting break needed between each transfer. VSS on 2L. She remains limited by weakness and enduracne. OT to continue to follow. POC remains appropriate.    Recommendations for follow up therapy are one component of a multi-disciplinary discharge planning process, led by the attending physician.  Recommendations may be updated based on patient status, additional functional criteria and insurance authorization.    Follow Up Recommendations  No OT follow up (pt declining - she would benefit from [facility].)     Assistance Recommended at Discharge Intermittent Supervision/Assistance  Patient can return home with the following  A little help with walking and/or transfers;A lot of help with bathing/dressing/bathroom;Assistance with cooking/housework;Assist for transportation;Help with stairs or ramp for entrance   Equipment Recommendations  BSC/3in1       Precautions / Restrictions Precautions Precautions: Fall;Other (comment) Precaution Comments: Watch SpO2 (does not wear O2 baseline) COVID + Restrictions Weight Bearing  Restrictions: No       Mobility Bed Mobility Overal bed mobility: Needs Assistance Bed Mobility: Supine to Sit, Sit to Supine     Supine to sit: Mod assist Sit to supine: Mod assist        Transfers Overall transfer level: Needs assistance Equipment used: Rolling walker (2 wheels) Transfers: Sit to/from Stand Sit to Stand: Min assist           General transfer comment: increased time and cues needed     Balance Overall balance assessment: Needs assistance Sitting-balance support: Feet supported Sitting balance-Leahy Scale: Good     Standing balance support: Bilateral upper extremity supported, During functional activity Standing balance-Leahy Scale: Fair                             ADL either performed or assessed with clinical judgement   ADL Overall ADL's : Needs assistance/impaired                         Toilet Transfer: Minimal assistance;Stand-pivot;Rolling walker (2 wheels);BSC/3in1 Toilet Transfer Details (indicate cue type and reason): simulated         Functional mobility during ADLs: Minimal assistance;Rolling walker (2 wheels) General ADL Comments: limited due to pt getting blood and general fatigue. pt declining ADLs to agreeable to sit<>stands and lateral stepping at the bed side.    Extremity/Trunk Assessment Upper Extremity Assessment Upper Extremity Assessment: Generalized weakness   Lower Extremity Assessment Lower Extremity Assessment: Generalized weakness        Vision   Vision Assessment?: No apparent visual deficits   Perception Perception Perception: Not tested   Praxis Praxis Praxis: Not tested    Cognition Arousal/Alertness: Awake/alert Behavior During  Therapy: WFL for tasks assessed/performed Overall Cognitive Status: Within Functional Limits for tasks assessed                                 General Comments: limited insight              General Comments VSS on 2L. Pt with  report of fatigue and stomach pain    Pertinent Vitals/ Pain       Pain Assessment Pain Assessment: Faces Faces Pain Scale: Hurts little more Pain Location: abdomen Pain Descriptors / Indicators: Discomfort Pain Intervention(s): Limited activity within patient's tolerance, Monitored during session   Frequency  Min 2X/week        Progress Toward Goals  OT Goals(current goals can now be found in the care plan section)  Progress towards OT goals: Progressing toward goals  Acute Rehab OT Goals Patient Stated Goal: to go home OT Goal Formulation: With patient Time For Goal Achievement: 06/22/22 Potential to Achieve Goals: Good ADL Goals Pt Will Perform Grooming: with modified independence;sitting Pt Will Perform Upper Body Dressing: with modified independence;sitting Pt Will Perform Lower Body Dressing: with supervision;sit to/from stand Pt Will Transfer to Toilet: with supervision;ambulating;regular height toilet Pt Will Perform Toileting - Clothing Manipulation and hygiene: with supervision;sit to/from stand Additional ADL Goal #1: Pt will perform bed mobility at Mod I level prior to engaging in ADL Additional ADL Goal #2: Pt will recall at least 3 energy conservation strategies to use during ADL with no cues  Plan Discharge plan remains appropriate       AM-PAC OT "6 Clicks" Daily Activity     Outcome Measure   Help from another person eating meals?: None Help from another person taking care of personal grooming?: A Little Help from another person toileting, which includes using toliet, bedpan, or urinal?: A Lot Help from another person bathing (including washing, rinsing, drying)?: A Lot Help from another person to put on and taking off regular upper body clothing?: A Little Help from another person to put on and taking off regular lower body clothing?: A Lot 6 Click Score: 16    End of Session Equipment Utilized During Treatment: Gait belt;Rollator (4  wheels);Oxygen  OT Visit Diagnosis: Unsteadiness on feet (R26.81);Muscle weakness (generalized) (M62.81);Pain Pain - Right/Left: Left Pain - part of body: Knee   Activity Tolerance Patient tolerated treatment well   Patient Left in bed;with bed alarm set;with call bell/phone within reach   Nurse Communication Mobility status        Time: 1645-1710 OT Time Calculation (min): 25 min  Charges: OT General Charges $OT Visit: 1 Visit OT Treatments $Therapeutic Activity: 23-37 mins    Elliot Cousin 06/10/2022, 5:19 PM

## 2022-06-11 ENCOUNTER — Ambulatory Visit: Payer: Medicare Other | Admitting: Physician Assistant

## 2022-06-11 DIAGNOSIS — K922 Gastrointestinal hemorrhage, unspecified: Secondary | ICD-10-CM | POA: Diagnosis not present

## 2022-06-11 LAB — CBC WITH DIFFERENTIAL/PLATELET
Abs Immature Granulocytes: 0.2 10*3/uL — ABNORMAL HIGH (ref 0.00–0.07)
Basophils Absolute: 0 10*3/uL (ref 0.0–0.1)
Basophils Relative: 0 %
Eosinophils Absolute: 0.2 10*3/uL (ref 0.0–0.5)
Eosinophils Relative: 2 %
HCT: 23.7 % — ABNORMAL LOW (ref 36.0–46.0)
Hemoglobin: 8.2 g/dL — ABNORMAL LOW (ref 12.0–15.0)
Immature Granulocytes: 2 %
Lymphocytes Relative: 6 %
Lymphs Abs: 0.5 10*3/uL — ABNORMAL LOW (ref 0.7–4.0)
MCH: 34.5 pg — ABNORMAL HIGH (ref 26.0–34.0)
MCHC: 34.6 g/dL (ref 30.0–36.0)
MCV: 99.6 fL (ref 80.0–100.0)
Monocytes Absolute: 0.6 10*3/uL (ref 0.1–1.0)
Monocytes Relative: 7 %
Neutro Abs: 7.1 10*3/uL (ref 1.7–7.7)
Neutrophils Relative %: 83 %
Platelets: 239 10*3/uL (ref 150–400)
RBC: 2.38 MIL/uL — ABNORMAL LOW (ref 3.87–5.11)
RDW: 19 % — ABNORMAL HIGH (ref 11.5–15.5)
WBC: 8.6 10*3/uL (ref 4.0–10.5)
nRBC: 0 % (ref 0.0–0.2)

## 2022-06-11 LAB — TYPE AND SCREEN
ABO/RH(D): A POS
Antibody Screen: NEGATIVE
DAT, IgG: NEGATIVE
Unit division: 0

## 2022-06-11 LAB — BPAM RBC
Blood Product Expiration Date: 202401092359
ISSUE DATE / TIME: 202312201628
Unit Type and Rh: 5100

## 2022-06-11 LAB — BASIC METABOLIC PANEL
Anion gap: 11 (ref 5–15)
BUN: 150 mg/dL — ABNORMAL HIGH (ref 8–23)
CO2: 26 mmol/L (ref 22–32)
Calcium: 8.2 mg/dL — ABNORMAL LOW (ref 8.9–10.3)
Chloride: 94 mmol/L — ABNORMAL LOW (ref 98–111)
Creatinine, Ser: 4.23 mg/dL — ABNORMAL HIGH (ref 0.44–1.00)
GFR, Estimated: 10 mL/min — ABNORMAL LOW (ref >60)
Glucose, Bld: 208 mg/dL — ABNORMAL HIGH (ref 70–99)
Potassium: 4.6 mmol/L (ref 3.5–5.1)
Sodium: 131 mmol/L — ABNORMAL LOW (ref 135–145)

## 2022-06-11 LAB — MAGNESIUM: Magnesium: 2.1 mg/dL (ref 1.7–2.4)

## 2022-06-11 LAB — C-REACTIVE PROTEIN: CRP: 4.3 mg/dL — ABNORMAL HIGH (ref <1.0)

## 2022-06-11 LAB — GLUCOSE, CAPILLARY
Glucose-Capillary: 204 mg/dL — ABNORMAL HIGH (ref 70–99)
Glucose-Capillary: 234 mg/dL — ABNORMAL HIGH (ref 70–99)
Glucose-Capillary: 230 mg/dL — ABNORMAL HIGH (ref 70–99)
Glucose-Capillary: 234 mg/dL — ABNORMAL HIGH (ref 70–99)

## 2022-06-11 LAB — BRAIN NATRIURETIC PEPTIDE: B Natriuretic Peptide: 2120.6 pg/mL — ABNORMAL HIGH (ref 0.0–100.0)

## 2022-06-11 LAB — UREA NITROGEN, URINE: Urea Nitrogen, Ur: 511 mg/dL

## 2022-06-11 MED ORDER — SALINE SPRAY 0.65 % NA SOLN
1.0000 | NASAL | Status: DC | PRN
  Administered 2022-06-11: 1 via NASAL
  Filled 2022-06-11: qty 44

## 2022-06-11 NOTE — Progress Notes (Signed)
Physical Therapy Treatment Patient Details Name: HASEL JANISH MRN: [id number] DOB: 11-Jun-1941 Today's Date: 06/11/2022   History of Present Illness 81 yo female admitted with fever, general malaise, and dark stools; Covid +; GI on board; with medical history significant for chronic biventricular systolic heart failure, paroxysmal atrial fibrillation complicated by sick sinus syndrome status post pacemaker placement, chronically anticoagulated on Eliquis, chronic hypoxic respiratory failure on 2 L continuous nasal cannula, type 2 diabetes mellitus, CKD 4, anemia    PT Comments    Patient easily fatigues with ambulation x 10 ft with RW and 3L O2. Requested return to chair and ambulated 10 ft back to chair with pulse oximeter not registering while walking. Once seated and good pleth, sats 90% with min dyspnea. Continues to require min assist to come to stand. Noted family now agrees to SNF, which is honestly the best discharge plan for her at this time.     Recommendations for follow up therapy are one component of a multi-disciplinary discharge planning process, led by the attending physician.  Recommendations may be updated based on patient status, additional functional criteria and insurance authorization.  Follow Up Recommendations  Skilled nursing-short term rehab (<3 hours/day) (pt/family now in agreement) Can patient physically be transported by private vehicle: No   Assistance Recommended at Discharge Intermittent Supervision/Assistance  Patient can return home with the following Assistance with cooking/housework;Assist for transportation;Help with stairs or ramp for entrance;A little help with walking and/or transfers;A little help with bathing/dressing/bathroom   Equipment Recommendations  None recommended by PT    Recommendations for Other Services       Precautions / Restrictions Precautions Precautions: Fall;Other (comment) Precaution Comments: Watch SpO2 (does not wear O2  baseline) COVID + Restrictions Weight Bearing Restrictions: No     Mobility  Bed Mobility               General bed mobility comments: up in recliner    Transfers Overall transfer level: Needs assistance Equipment used: Rolling walker (2 wheels) Transfers: Sit to/from Stand Sit to Stand: Min assist           General transfer comment: increased time and cues needed, boosted    Ambulation/Gait Ambulation/Gait assistance: Min guard, Supervision Gait Distance (Feet): 20 Feet Assistive device: Rolling walker (2 wheels) Gait Pattern/deviations: Step-through pattern, Decreased stride length, Wide base of support, Trunk flexed Gait velocity: Decreased     General Gait Details: walked 10 ft and wanted to turn back due to fatigue; pulse oximeter not reading while walking; upon sitting down and good pleth sats 90%   Stairs             Wheelchair Mobility    Modified Rankin (Stroke Patients Only)       Balance Overall balance assessment: Needs assistance Sitting-balance support: Feet supported Sitting balance-Leahy Scale: Good     Standing balance support: Bilateral upper extremity supported, During functional activity Standing balance-Leahy Scale: Poor                              Cognition Arousal/Alertness: Awake/alert Behavior During Therapy: WFL for tasks assessed/performed Overall Cognitive Status: Within Functional Limits for tasks assessed                                 General Comments: limited insight        Exercises General Exercises -  Lower Extremity Long Arc Quad: AROM, Both, 10 reps Hip Flexion/Marching: AROM, Both, 10 reps, Standing (with UE support on RW) Toe Raises: AROM, Both, 10 reps, Seated Heel Raises: AROM, Both, 10 reps, Seated    General Comments General comments (skin integrity, edema, etc.): on 3L throughout; when pulse ox working sats 90-95%      Pertinent Vitals/Pain Pain  Assessment Pain Assessment: Faces Faces Pain Scale: Hurts little more Pain Location: abdomen Pain Descriptors / Indicators: Discomfort Pain Intervention(s): Limited activity within patient's tolerance, Monitored during session    Home Living                          Prior Function            PT Goals (current goals can now be found in the care plan section) Acute Rehab PT Goals Patient Stated Goal: go home PT Goal Formulation: With patient Time For Goal Achievement: 06/21/22 Potential to Achieve Goals: Good Progress towards PT goals: Progressing toward goals    Frequency    Min 3X/week      PT Plan Discharge plan needs to be updated    Co-evaluation              AM-PAC PT "6 Clicks" Mobility   Outcome Measure  Help needed turning from your back to your side while in a flat bed without using bedrails?: A Lot Help needed moving from lying on your back to sitting on the side of a flat bed without using bedrails?: A Lot Help needed moving to and from a bed to a chair (including a wheelchair)?: A Little Help needed standing up from a chair using your arms (e.g., wheelchair or bedside chair)?: A Little Help needed to walk in hospital room?: A Little Help needed climbing 3-5 steps with a railing? : A Lot 6 Click Score: 15    End of Session Equipment Utilized During Treatment: Gait belt;Oxygen Activity Tolerance: Patient limited by fatigue Patient left: with call bell/phone within reach;in chair Nurse Communication: Mobility status PT Visit Diagnosis: Other abnormalities of gait and mobility (R26.89)     Time: 2297-9892 PT Time Calculation (min) (ACUTE ONLY): 26 min  Charges:  $Gait Training: 8-22 mins $Therapeutic Exercise: 8-22 mins                      Arby Barrette, PT Acute Rehabilitation Services  Office [phone]    Rexanne Mano 06/11/2022, 2:32 PM

## 2022-06-11 NOTE — Progress Notes (Signed)
PROGRESS NOTE        PATIENT DETAILS Name: Tricia Gonzales Age: 81 y.o. Sex: female Date of Birth: 06-11-1941 Admit Date: 06/05/2022 Admitting Physician Rhetta Mura, DO EEF:EOFHQ, Hal Hope, MD  Brief Summary: Patient is a 81 y.o.  female with history of chronic HFrEF, PAF on Eliquis, sick sinus syndrome-s/p PPM placement, chronic hypoxic respiratory failure on home O2, DM-2, CKD 4 who presented with subjective fever-was found to have PNA/COVID-19 infection, melena/GI bleed with acute blood loss anemia and AKI on CKD 4.  Significant events: 11/30-12/07>> hospitalization- acute on chronic HFrEF 12/15>> hospitalization for COVID infection/PNA/possible GI bleed/AKI on CKD 4.  Significant studies: 11/20>> echo: EF 19-75%, RV systolic function moderately reduced. 12/15>> CXR: Retrocardiac consolidation/atelectasis.  Significant microbiology data: 12/15>> COVID PCR: Positive (CT value 26.1) 12/15>> influenza A/B/RSV: Negative  Procedures: None  Consults: GI Cardiology  Subjective:   Patient in bed, appears comfortable, denies any headache, no fever, no chest pain or pressure, no shortness of breath , no abdominal pain. No new focal weakness.   Objective: Vitals: Blood pressure (!) 126/57, pulse 81, temperature 97.7 F (36.5 C), temperature source Oral, resp. rate 20, height _0  (1.473 m), weight 81.6 kg, SpO2 98 %.   Exam:  Awake Alert, No new F.N deficits, Normal affect [place].AT,PERRAL Supple Neck, No JVD,   Symmetrical Chest wall movement, Good air movement bilaterally, few Rales RRR,No Gallops, Rubs or new Murmurs,  +ve B.Sounds, Abd Soft, No tenderness,   No Cyanosis, Clubbing or edema    Assessment/Plan:  Upper GI bleed with acute blood loss anemia No further melanotic stools since presentation (on iron supplementation at home) S/p 2 units of PRBC-this admission, currently stable continue to monitor.  Giving another unit on 06/10/2022  due to AKI, H&H relatively stable. Continue PPI-seen by [facility] no endoscopy is here so far.  Resumed Eliquis 06/09/2022 as per [facility] Dr. Leanna Sato instructions. Closely monitoring CBC and H&H, if drops further we will reconsult GI and hold Eliquis.   Borderline low B12.  Placed on replacement.    AKI on CKD 4 AKI likely hemodynamically mediated due to GI bleed Continue to hold nephrotoxins, 1 unit packed RBC on 06/10/2022, renal ultrasound, some evidence of urinary retention on bladder scan, stable renal ultrasound, added Flomax, urine electrolytes borderline however on exam on 06/11/2022 appears slightly fluid overloaded, nephrology consulted as renal function has declined further.   PNA Likely bacterial-perhaps aspiration Obtain SLP eval Although COVID-positive-procalcitonin is elevated-CXR more consistent with bacterial PNA-hence we will continue with antibiotics.   COVID-19 infection Remdesivir x 3 days Procalcitonin elevated Awaiting CRP Stable on 2 L of oxygen-this is her home regimen Hold off on steroids for now   Chronic HFrEF Volume status relatively stable Given ongoing GI bleed/AKI-diuretics were held, low-dose Lasix x 1 on 06/09/2022.   PAF Rate controlled with Coreg Eliquis was on hold, case was discussed with Dr. Alessandra Bevels on 06/07/2022 will resume Eliquis in 2023.  Monitor in telemetry.  History of sick sinus syndrome/complete heart block-s/p PPM placement Telemetry monitoring  Group 2 pulmonary hypertension Moderate-severe MR Severe TR Chronic hypoxic respiratory failure on 2 L of oxygen at home Telemetry monitoring Chronic issues Cardiology following  Hypokalemia Replete/recheck  HTN BP stable Continue Coreg cautiously Imdur/hydralazine on hold-due to ongoing GI bleed  HLD Continue statin  Hypothyroidism Continue Synthroid Recent  TSH 12/1 stable  Obesity: Estimated body mass index is 37.62 kg/m as calculated from the  following:   Height as of this encounter: _0  (1.473 m).   Weight as of this encounter: 81.6 kg.    Code status:   Code Status: DNR reconfirmed with daughter/patient this morning.  DVT Prophylaxis: Place TED hose Start: 06/11/22 0547 apixaban (ELIQUIS) tablet 2.5 mg Start: 06/09/22 1245 SCDs Start: 06/05/22 2151 apixaban (ELIQUIS) tablet 2.5 mg    Family Communication:   Daughter-[name] updated over the phone on 06/10/22, 06/11/2022   Disposition Plan: Status is: Inpatient Remains inpatient appropriate because: Upper GI bleed-acute blood loss anemia-PNA-COVID infection-AKI-not yet stable for discharge.   Planned Discharge Destination:Home health   Diet: Diet Order             DIET SOFT Room service appropriate? Yes with Assist; Fluid consistency: Thin  Diet effective now                    MEDICATIONS: Scheduled Meds:  apixaban  2.5 mg Oral BID   carvedilol  12.5 mg Oral BID WC   vitamin B-12  1,000 mcg Oral Daily   febuxostat  40 mg Oral Q M,W,F   insulin aspart  0-9 Units Subcutaneous Q6H   levothyroxine  75 mcg Oral QHS   montelukast  10 mg Oral QHS   multivitamin with minerals  1 tablet Oral Daily   pantoprazole  40 mg Oral BID AC   rOPINIRole  0.5 mg Oral QHS   rosuvastatin  5 mg Oral Daily   tamsulosin  0.4 mg Oral Daily   Continuous Infusions:   PRN Meds:.acetaminophen **OR** acetaminophen, fentaNYL (SUBLIMAZE) injection, HYDROcodone-acetaminophen, naLOXone (NARCAN)  injection, ondansetron (ZOFRAN) IV, sodium chloride   I have personally reviewed following labs and imaging studies  LABORATORY DATA:  Recent Labs  Lab 06/06/22 0139 06/06/22 0845 06/07/22 0340 06/08/22 0435 06/09/22 0507 06/10/22 0325 06/11/22 0400  WBC 8.7   < > 7.6 7.1 6.4 6.5 8.6  HGB 7.5*   < > 9.5* 8.7* 8.0* 7.7* 8.2*  HCT 23.4*   < > 27.8* 23.5* 23.2* 23.1* 23.7*  PLT 204   < > 235 236 225 258 239  MCV 98.7   < > 97.5 102.6* 102.2* 105.0* 99.6   MCH 31.6   < > 33.3 38.0* 35.2* 35.0* 34.5*  MCHC 32.1   < > 34.2 37.0* 34.5 33.3 34.6  RDW 17.3*   < > 19.5* 21.7* 20.1* 20.6* 19.0*  LYMPHSABS 0.2*  --   --  0.3* 0.4* 0.4* 0.5*  MONOABS 0.4  --   --  0.6 0.7 0.6 0.6  EOSABS 0.0  --   --  0.2 0.2 0.2 0.2  BASOSABS 0.0  --   --  0.0 0.0 0.0 0.0   < > = values in this interval not displayed.    Recent Labs  Lab 06/05/22 2018 06/06/22 0139 06/06/22 0139 06/06/22 0657 06/06/22 0845 06/07/22 0340 06/08/22 0435 06/09/22 0507 06/10/22 0325 06/11/22 0400  NA 137 138  --   --   --  138 136 135 134* 131*  K 3.1* 3.4*  --  3.4*  --  3.1* 4.2 3.9 4.0 4.6  CL 90* 91*  --   --   --  94* 95* 96* 94* 94*  CO2 32 32  --   --   --  _1 ANIONGAP 15 15  --   --   --  _0 GLUCOSE 160* 172*  --   --   --  166* 180* 114* 184* 208*  BUN 161* 160*  --   --   --  153* 153* 152* 153* 150*  CREATININE 4.10* 4.00*  --   --   --  3.83* 3.74* 3.72* 3.87* 4.23*  AST 24 24  --   --   --   --   --   --   --   --   ALT 12 12  --   --   --   --   --   --   --   --   ALKPHOS 66 63  --   --   --   --   --   --   --   --   BILITOT 1.3* 1.5*  --   --   --   --   --   --   --   --   ALBUMIN 2.6* 2.5*  --   --   --  2.4*  --   --   --   --   CRP  --   --    < > 14.4* 10.5*  --  9.9* 7.3* 5.9* 4.3*  DDIMER  --   --   --  0.83*  --   --   --   --   --   --   PROCALCITON  --   --   --  1.73  --   --   --   --   --   --   INR 2.1*  --   --   --   --   --   --   --   --   --   BNP  --   --   --   --   --   --  2,364.1* 2,453.2* 2,019.1* 2,120.6*  MG  --  2.2  --   --   --   --  2.1 2.1 2.2 2.1  CALCIUM 8.5* 8.5*  --   --   --  8.5* 8.6* 8.2* 8.2* 8.2*   < > = values in this interval not displayed.    RADIOLOGY STUDIES/RESULTS: US RENAL  Result Date: 06/10/2022 CLINICAL DATA:  Acute kidney injury. EXAM: RENAL / URINARY TRACT ULTRASOUND COMPLETE COMPARISON:  July 17, 2010. FINDINGS: Right Kidney: Renal measurements: 9.6 x 4.9 x 4.5 cm =  volume: 110 mL. Cortical thinning is noted. Multiple cysts are noted, the largest measuring 4.3 cm. Echogenicity within normal limits. No mass or hydronephrosis visualized. Left Kidney: Renal measurements: 10.1 x 5.3 x 4.6 cm = volume: 128 mL. Cortical thinning is noted. 1.2 cm cyst is noted. Echogenicity within normal limits. No mass or hydronephrosis visualized. Bladder: Appears normal for degree of bladder distention. Other: None IMPRESSION: Bilateral renal cortical thinning is noted. Bilateral renal cysts are noted. No hydronephrosis or renal obstruction is noted. Electronically Signed   By: Marijo Conception M.D.   On: 06/10/2022 14:42     LOS: 6 days   Signature  -    Lala Lund M.D on 06/11/2022 at 10:11 AM   -  To page go to [email or web address]

## 2022-06-11 NOTE — Inpatient Diabetes Management (Signed)
Inpatient Diabetes Program Recommendations  AACE/ADA: New Consensus Statement on Inpatient Glycemic Control (2015)  Target Ranges:  Prepandial:   less than 140 mg/dL      Peak postprandial:   less than 180 mg/dL (1-2 hours)      Critically ill patients:  140 - 180 mg/dL   Lab Results  Component Value Date   GLUCAP 204 (H) 06/11/2022   HGBA1C 7.7 (H) 05/23/2022    Review of Glycemic Control  Latest Reference Range & Units 06/10/22 12:15 06/10/22 16:38 06/10/22 23:41 06/11/22 06:07  Glucose-Capillary 70 - 99 mg/dL 201 (H) 176 (H) 253 (H) 204 (H)  (H): Data is abnormally high Diabetes history: Type 2 DM Outpatient Diabetes medications: Tresiba 8-10 units QD Current orders for Inpatient glycemic control: Novolog 0-9 units Q6H  Inpatient Diabetes Program Recommendations:    Could consider adding Semglee 8 units QHS and changing diet to TID & HS.   Thanks, Bronson Curb, MSN, RNC-OB Diabetes Coordinator [phone] (8a-5p)

## 2022-06-11 NOTE — Consult Note (Signed)
Reason for Consult: AKI/CKD stage IV Referring Physician:  Candiss Norse, MD  Tricia Gonzales is an 81 y.o. 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  She was diuresed and discharged to home only to return to [facility] ED on 06/05/22 with a 3 day history of fevers, new onset productive cough, and dark watery stools.  In the ED, VSS, SpO2 95%.  Labs notable for + covid-19, Hgb 6.9, K 3.1, BUN 161, Cr 4.1, alb 2.6, BNP 2364.  She was admitted for blood transfusion as well as GI evaluation.  Cardiology was consulted as well and held home torsemide and metolazone due to AKI and diarrhea.  Eliquis was also held due to GIB.  We were consulted due to worsening renal function after an initial improvement.  The trend in Scr is seen below.  She knows about the severity and chronicity of her CKD and follows Dr. Moshe Cipro in our office.  She has spoken to her about dialysis and does not wish to pursue RRT.  She did have some urinary retention with bladder scan of 300 mL, and was started on flomax.  PVR was normal and renal US without evidence of obstruction.   She denies any N/V, anorexia, or dysgeusia.    Trend in Creatinine:   Creatinine, Ser  Date/Time Value Ref Range Status  06/11/2022 04:00 AM 4.23 (H) 0.44 - 1.00 mg/dL Final  06/10/2022 03:25 AM 3.87 (H) 0.44 - 1.00 mg/dL Final  06/09/2022 05:07 AM 3.72 (H) 0.44 - 1.00 mg/dL Final  06/08/2022 04:35 AM 3.74 (H) 0.44 - 1.00 mg/dL Final  06/07/2022 03:40 AM 3.83 (H) 0.44 - 1.00 mg/dL Final  06/06/2022 01:39 AM 4.00 (H) 0.44 - 1.00 mg/dL Final  06/05/2022 08:18 PM 4.10 (H) 0.44 - 1.00 mg/dL Final  06/02/2022 12:47 PM 3.32 (H) 0.44 - 1.00 mg/dL  Final  05/28/2022 01:04 AM 3.35 (H) 0.44 - 1.00 mg/dL Final  05/27/2022 12:59 AM 3.08 (H) 0.44 - 1.00 mg/dL Final  05/26/2022 01:02 AM 3.04 (H) 0.44 - 1.00 mg/dL Final  05/25/2022 12:44 AM 2.98 (H) 0.44 - 1.00 mg/dL Final  05/24/2022 12:36 AM 2.70 (H) 0.44 - 1.00 mg/dL Final  05/23/2022 06:21 PM 2.62 (H) 0.44 - 1.00 mg/dL Final  05/22/2022 01:06 AM 2.61 (H) 0.44 - 1.00 mg/dL Final  05/21/2022 08:57 PM 2.51 (H) 0.44 - 1.00 mg/dL Final  05/20/2022 03:57 PM 2.41 (H) 0.57 - 1.00 mg/dL Final  04/15/2022 11:23 AM 1.85 (H) 0.57 - 1.00 mg/dL Final  04/02/2022 02:34 PM 2.05 (H) 0.57 - 1.00 mg/dL Final  03/20/2022 10:28 AM 2.16 (H) 0.57 - 1.00 mg/dL Final  01/13/2022 01:05 PM 2.00 (H) 0.44 - 1.00 mg/dL Final  10/21/2021 01:17 PM 1.82 (H) 0.44 - 1.00 mg/dL Final  07/31/2021 01:37 PM 1.80 (H) 0.44 - 1.00 mg/dL Final  05/07/2021 01:34 PM 2.40 (H) 0.44 - 1.00 mg/dL Final  08/20/2020 03:47 AM 2.74 (H) 0.44 - 1.00 mg/dL Final  08/19/2020 02:45 AM 2.43 (H) 0.44 - 1.00 mg/dL Final  08/18/2020 04:14 AM 2.16 (H) 0.44 - 1.00 mg/dL Final  08/17/2020 03:18 AM 2.26 (H) 0.44 - 1.00 mg/dL Final  08/16/2020 06:19 AM 2.16 (H) 0.44 -  1.00 mg/dL Final  08/15/2020 09:29 PM 2.18 (H) 0.44 - 1.00 mg/dL Final  08/05/2020 04:20 PM 2.19 (H) 0.57 - 1.00 mg/dL Final  07/31/2020 11:06 AM 2.00 (H) 0.44 - 1.00 mg/dL Final  07/19/2020 02:43 PM 1.79 (H) 0.57 - 1.00 mg/dL Final  08/13/2015 12:11 PM 1.22 (H) 0.44 - 1.00 mg/dL Final  08/13/2015 03:15 AM 1.21 (H) 0.44 - 1.00 mg/dL Final  08/12/2015 03:41 AM 1.38 (H) 0.44 - 1.00 mg/dL Final  08/11/2015 02:48 AM 1.33 (H) 0.44 - 1.00 mg/dL Final  08/10/2015 04:55 AM 1.62 (H) 0.44 - 1.00 mg/dL Final  08/09/2015 03:20 AM 1.84 (H) 0.44 - 1.00 mg/dL Final  08/08/2015 05:28 AM 2.08 (H) 0.44 - 1.00 mg/dL Final  08/07/2015 10:58 AM 2.23 (H) 0.44 - 1.00 mg/dL Final  08/06/2015 09:04 PM 2.28 (H) 0.44 - 1.00 mg/dL Final  08/06/2015 01:21 PM 1.87 (H) 0.44 - 1.00 mg/dL Final  08/06/2015 01:46  AM 1.86 (H) 0.44 - 1.00 mg/dL Final  06/18/2015 05:56 AM 1.67 (H) 0.44 - 1.00 mg/dL Final  06/17/2015 11:52 AM 1.89 (H) 0.44 - 1.00 mg/dL Final  03/22/2015 02:06 PM 1.94 (H) 0.40 - 1.20 mg/dL Final  09/16/2011 06:40 AM 1.43 (H) 0.50 - 1.10 mg/dL Final  09/15/2011 11:02 PM 1.50 (H) 0.50 - 1.10 mg/dL Final  09/15/2011 09:27 PM 1.70 (H) 0.50 - 1.10 mg/dL Final  03/11/2010 01:20 PM 1.22 (H) 0.4 - 1.2 mg/dL Final  10/25/2009 02:00 PM 1.01 0.4 - 1.2 mg/dL Final   Creatinine  Date/Time Value Ref Range Status  11/12/2020 10:54 AM 2.08 (H) 0.44 - 1.00 mg/dL Final  09/17/2020 11:21 AM 1.95 (H) 0.44 - 1.00 mg/dL Final  02/21/2019 11:12 AM 2.11 (H) 0.44 - 1.00 mg/dL Final  07/05/2018 01:37 PM 1.69 (H) 0.44 - 1.00 mg/dL Final  12/20/2017 01:36 PM 1.43 (H) 0.44 - 1.00 mg/dL Final  11/17/2017 02:16 PM 1.87 (H) 0.60 - 1.10 mg/dL Final  10/20/2016 01:21 PM 1.6 (H) 0.6 - 1.1 mg/dL Final    PMH:   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   Anxiety    Arthritis    Atrial fibrillation (HCC)    Avascular necrosis (HCC) hip left   leg pain also   Chronic back pain    Chronic kidney disease (CKD), stage III (moderate) (HCC)    Depression    Diverticulosis    Early cataracts, bilateral    Esophageal stricture    Gastritis    GAVE (gastric antral vascular ectasia)    GERD (gastroesophageal reflux disease)    Glaucoma    Heart murmur     " some doctors say that I have one some say that I don"t "   Hiatal hernia    History of blood transfusion    "@ least w/1st knee OR"   History of gout    History of kidney stones    Hyperlipidemia    Hypertension    Intestinal obstruction ([place])    Kidney stones    Obesity    Osteoarthritis    Osteoporosis    Pericarditis 2016   Presence of permanent cardiac pacemaker    Sjogren's disease ([place])    Thyroid disease    Type II diabetes mellitus ([place])    Vitamin B12 deficiency     PSH:   Past  Surgical History:  Procedure Laterality Date   ABDOMINAL HYSTERECTOMY     complete   CARDIOVERSION N/A  07/31/2020   Procedure: CARDIOVERSION;  Surgeon: Werner Lean, MD;  Location: [facility] END[facility];  Service: Cardiovascular;  Laterality: N/A;   CHOLECYSTECTOMY OPEN     COLECTOMY     for rectovaginal fistula   COLONOSCOPY  01/07/2012   Procedure: COLONOSCOPY;  Surgeon: Lafayette Dragon, MD;  Location: [facility];  Service: Endoscopy;  Laterality: N/A;   EP IMPLANTABLE DEVICE N/A 06/18/2015   Procedure: Pacemaker Implant;  Surgeon: Evans Lance, MD;  Location: [place] CV LAB;  Service: Cardiovascular;  Laterality: N/A;   ESOPHAGEAL DILATION  08/17/2020   Procedure: ESOPHAGEAL DILATION;  Surgeon: Lavena Bullion, DO;  Location: [facility]OSCOPY;  Service: Gastroenterology;;   ESOPHAGOGASTRODUODENOSCOPY N/A 11/24/2018   Procedure: ESOPHAGOGASTRODUODENOSCOPY (EGD);  Surgeon: Yetta Flock, MD;  Location: [facility];  Service: Gastroenterology;  Laterality: N/A;   ESOPHAGOGASTRODUODENOSCOPY (EGD) WITH ESOPHAGEAL DILATION     ESOPHAGOGASTRODUODENOSCOPY (EGD) WITH PROPOFOL N/A 08/12/2015   Procedure: ESOPHAGOGASTRODUODENOSCOPY (EGD) WITH PROPOFOL;  Surgeon: Manus Gunning, MD;  Location: [facility];  Service: Gastroenterology;  Laterality: N/A;   ESOPHAGOGASTRODUODENOSCOPY (EGD) WITH PROPOFOL N/A 08/17/2020   Procedure: ESOPHAGOGASTRODUODENOSCOPY (EGD) WITH PROPOFOL;  Surgeon: Lavena Bullion, DO;  Location: [place] [facility];  Service: Gastroenterology;  Laterality: N/A;   EYE SURGERY Bilateral    cataracts   HOT HEMOSTASIS N/A 11/24/2018   Procedure: HOT HEMOSTASIS (ARGON PLASMA COAGULATION/BICAP);  Surgeon: Yetta Flock, MD;  Location: [facility];  Service: Gastroenterology;  Laterality: N/A;   HOT HEMOSTASIS N/A 08/17/2020   Procedure: HOT HEMOSTASIS (ARGON PLASMA COAGULATION/BICAP);  Surgeon: Lavena Bullion, DO;  Location: [facility] ENDOSCOPY;  Service:  Gastroenterology;  Laterality: N/A;   I & D EXTREMITY Right 08/06/2016   Procedure: DEBRIDEMENT PIP RIGHT RING FINGER;  Surgeon: Daryll Brod, MD;  Location: [facility];  Service: Orthopedics;  Laterality: Right;   INSERT / REPLACE / REMOVE PACEMAKER     JOINT REPLACEMENT     KNEE ARTHROSCOPY Right    MASS EXCISION Right 08/06/2016   Procedure: EXCISION CYST;  Surgeon: Daryll Brod, MD;  Location: [place];  Service: Orthopedics;  Laterality: Right;   REVISION TOTAL KNEE ARTHROPLASTY Left    TOTAL KNEE ARTHROPLASTY Bilateral     Allergies:  Allergies  Allergen Reactions   Norvasc [Amlodipine Besylate] Shortness Of Breath   Other Rash and Other (See Comments)    Pt voiced she is allergic to microfiber materials/ blankets/sheets!!   Amlodipine Besylate     Other reaction(s): SOB   Avapro [Irbesartan] Other (See Comments)    Headaches   Glucophage [Metformin Hydrochloride] Other (See Comments)    "increases creatinine" and diarrhea   Lisinopril     Headache    Losartan Other (See Comments)    Headaches    Lumigan [Bimatoprost] Other (See Comments)    Eye burning and irritation   Morphine And Related Other (See Comments)    "Hallucinations"   Simvastatin Other (See Comments)    Body aches   Trulicity [Dulaglutide] Other (See Comments)    Severe mood swings    Medications:   Prior to Admission medications   Medication Sig Start Date End Date Taking? Authorizing Provider  acetaminophen (TYLENOL) 650 MG CR tablet Take 1,300 mg by mouth every evening.   Yes [provider]  carvedilol (COREG) 12.5 MG tablet Take 18.75 mg by mouth 2 (two) times daily with a meal.   Yes [provider]  Cholecalciferol (VITAMIN D3) 25 MCG (1000 UT) CAPS Take 1,000 Units by mouth  daily.   Yes [provider]  cycloSPORINE (RESTASIS) 0.05 % ophthalmic emulsion Place 1 drop into both eyes 2 (two) times daily as needed (eye dryness). 10/30/20  Yes [provider]  dicyclomine  (BENTYL) 10 MG capsule Take 1 capsule (10 mg total) by mouth every 8 (eight) hours as needed for spasms. 10/07/21  Yes Armbruster, Carlota Raspberry, MD  ELIQUIS 2.5 MG TABS tablet TAKE ONE TABLET TWICE DAILY 02/09/22  Yes O'Neal, Cassie Freer, MD  febuxostat (ULORIC) 40 MG tablet Take 1 tablet (40 mg total) by mouth every Monday, Wednesday, and Friday. 05/12/19  Yes Lassen, Arlo C, PA-C  ferrous sulfate 325 (65 FE) MG EC tablet Take 325 mg by mouth daily with breakfast.   Yes [provider]  gabapentin (NEURONTIN) 400 MG capsule Take 400 mg by mouth 2 (two) times daily.   Yes [provider]  hydrALAZINE (APRESOLINE) 25 MG tablet Take 1 tablet (25 mg total) by mouth every 8 (eight) hours. 05/28/22  Yes Duke, Tami Lin, PA  isosorbide mononitrate (IMDUR) 30 MG 24 hr tablet Take 1 tablet (30 mg total) by mouth daily. 05/29/22  Yes Duke, Tami Lin, PA  levothyroxine (SYNTHROID) 75 MCG tablet Take 75 mcg by mouth at bedtime. 04/14/21  Yes [provider]  lidocaine (LIDODERM) 5 % Place 1-3 patches onto the skin See admin instructions. Apply 1-3 patches transdermally every 24 hours as needed for pain and remove & discard patches within 12 hours or as directed by MD   Yes [provider]  metolazone (ZAROXOLYN) 5 MG tablet Take 1 tablet (5 mg total) by mouth every Monday. 06/01/22  Yes Duke, Tami Lin, PA  montelukast (SINGULAIR) 10 MG tablet Take 1 tablet (10 mg total) by mouth at bedtime. 05/12/19  Yes Lassen, Arlo C, PA-C  nystatin ([place]) powder APPLY TOPICALLY TO LEFT ABDOMINAL FOLD AND GROIN TWICE A DAY Patient taking differently: Apply 1 Application topically daily as needed (Fungal infection). APPLY TOPICALLY TO LEFT ABDOMINAL FOLD AND GROIN 05/12/19  Yes Oscar La, Arlo C, PA-C  ondansetron (ZOFRAN-ODT) 4 MG disintegrating tablet Take 1 tablet (4 mg total) by mouth every 8 (eight) hours as needed for nausea or vomiting. 11/11/21  Yes Armbruster, Carlota Raspberry, MD   pantoprazole (PROTONIX) 40 MG tablet Take 40 mg by mouth daily.   Yes [provider]  pentoxifylline (TRENTAL) 400 MG CR tablet Take 400 mg by mouth daily. With a meal   Yes [provider]  rOPINIRole (REQUIP) 0.5 MG tablet Take 0.5 mg by mouth at bedtime. 02/23/20  Yes [provider]  rosuvastatin (CRESTOR) 5 MG tablet Take 1 tablet (5 mg total) by mouth daily. 05/12/19  Yes Lassen, Arlo C, PA-C  sennosides-docusate sodium (SENOKOT-S) 8.6-50 MG tablet Take 1 tablet by mouth daily.   Yes [provider]  SF 5000 PLUS 1.1 % CREA dental cream Take by mouth as directed. 12/13/21  Yes [provider]  SIMBRINZA 1-0.2 % SUSP Place 1 drop into the right eye 2 (two) times daily. 03/24/22  Yes [provider]  torsemide (DEMADEX) 20 MG tablet Take 2 tablets (40 mg total) by mouth daily. 03/27/22  Yes Almyra Deforest, PA  traMADol (ULTRAM) 50 MG tablet Take 50 mg by mouth 2 (two) times daily. 02/13/20  Yes [provider]  TRESIBA FLEXTOUCH 100 UNIT/ML FlexTouch Pen Inject 8-10 Units into the skin every evening. 02/21/20  Yes [provider]  ACCU-CHEK AVIVA PLUS test strip 1 each by  Other route daily.  03/15/15   [provider]  BD PEN NEEDLE NANO U/F 32G X 4 MM MISC See admin instructions. 12/25/19   [provider]    Inpatient medications:  apixaban  2.5 mg Oral BID   carvedilol  12.5 mg Oral BID WC   vitamin B-12  1,000 mcg Oral Daily   febuxostat  40 mg Oral Q M,W,F   insulin aspart  0-9 Units Subcutaneous Q6H   levothyroxine  75 mcg Oral QHS   montelukast  10 mg Oral QHS   multivitamin with minerals  1 tablet Oral Daily   pantoprazole  40 mg Oral BID AC   rOPINIRole  0.5 mg Oral QHS   rosuvastatin  5 mg Oral Daily   tamsulosin  0.4 mg Oral Daily    Discontinued Meds:   Medications Discontinued During This Encounter  Medication Reason   traMADol (ULTRAM) tablet 50 mg    potassium chloride SA (KLOR-CON M) CR  tablet 40 mEq    potassium chloride SA (KLOR-CON M) CR tablet 60 mEq    pantoprozole (PROTONIX) 80 mg /NS 100 mL infusion    pantoprazole (PROTONIX) injection 40 mg    cefTRIAXone (ROCEPHIN) 1 g in sodium chloride 0.9 % 100 mL IVPB    azithromycin (ZITHROMAX) 500 mg in sodium chloride 0.9 % 250 mL IVPB    lactated ringers infusion     Social History:  reports that she quit smoking about 38 years ago. Her smoking use included cigarettes. She has a 6.00 pack-year smoking history. She has never been exposed to tobacco smoke. She has never used smokeless tobacco. She reports current alcohol use of about 2.0 standard drinks of alcohol per week. She reports that she does not use drugs.  Family History:   Family History  Problem Relation Age of Onset   Diabetes Mother    Hypertension Sister    Diabetes Brother    Colon cancer Neg Hx    Heart attack Neg Hx    Stroke Neg Hx    Colon polyps Neg Hx    Esophageal cancer Neg Hx    Rectal cancer Neg Hx    Stomach cancer Neg Hx     Pertinent items are noted in HPI. Weight change:   Intake/Output Summary (Last 24 hours) at 06/11/2022 1222 Last data filed at 06/11/2022 0711 Gross per 24 hour  Intake 1178.16 ml  Output 1 ml  Net 1177.16 ml   BP 116/66   Pulse 100   Temp 97.7 F (36.5 C) (Oral)   Resp (!) 24   Ht _0  (1.473 m)   Wt 81.6 kg   SpO2 97%   BMI 37.62 kg/m  Vitals:   06/11/22 0938 06/11/22 1000 06/11/22 1100 06/11/22 1120  BP: 116/66     Pulse:  85 91 100  Resp:  (!) 27 16 (!) 24  Temp:      TempSrc:      SpO2:  99% (!) 89% 97%  Weight:      Height:         General appearance: appears stated age, fatigued, no distress, morbidly obese, and pale Head: Normocephalic, without obvious abnormality, atraumatic Resp: clear to auscultation bilaterally Cardio: irregularly irregular rhythm and no rub GI: soft, non-tender; bowel sounds normal; no masses,  no organomegaly Extremities: edema trace ankle and lower leg edema  bilaterally, no pre-sacral edema  Labs: Basic Metabolic Panel: Recent Labs  Lab 06/05/22 2018 06/06/22 0139 06/06/22 0177  06/07/22 0340 06/08/22 0435 06/09/22 0507 06/10/22 0325 06/11/22 0400  NA 137 138  --  138 136 135 134* 131*  K 3.1* 3.4* 3.4* 3.1* 4.2 3.9 4.0 4.6  CL 90* 91*  --  94* 95* 96* 94* 94*  CO2 32 32  --  _0 GLUCOSE 160* 172*  --  166* 180* 114* 184* 208*  BUN 161* 160*  --  153* 153* 152* 153* 150*  CREATININE 4.10* 4.00*  --  3.83* 3.74* 3.72* 3.87* 4.23*  ALBUMIN 2.6* 2.5*  --  2.4*  --   --   --   --   CALCIUM 8.5* 8.5*  --  8.5* 8.6* 8.2* 8.2* 8.2*  PHOS  --   --   --  4.5  --   --   --   --    Liver Function Tests: Recent Labs  Lab 06/05/22 2018 06/06/22 0139 06/07/22 0340  AST 24 24  --   ALT 12 12  --   ALKPHOS 66 63  --   BILITOT 1.3* 1.5*  --   PROT 5.5* 5.2*  --   ALBUMIN 2.6* 2.5* 2.4*   No results for input(s): "LIPASE", "AMYLASE" in the last 168 hours. No results for input(s): "AMMONIA" in the last 168 hours. CBC: Recent Labs  Lab 06/08/22 0435 06/09/22 0507 06/10/22 0325 06/11/22 0400  WBC 7.1 6.4 6.5 8.6  NEUTROABS 5.9 5.0 5.1 7.1  HGB 8.7* 8.0* 7.7* 8.2*  HCT 23.5* 23.2* 23.1* 23.7*  MCV 102.6* 102.2* 105.0* 99.6  PLT 236 225 258 239   PT/INR: _1 (inr:5) Cardiac Enzymes: )No results for input(s): "CKTOTAL", "CKMB", "CKMBINDEX", "TROPONINI" in the last 168 hours. CBG: Recent Labs  Lab 06/10/22 0744 06/10/22 1215 06/10/22 1638 06/10/22 2341 06/11/22 0607  GLUCAP 179* 201* 176* 253* 204*    Iron Studies:  Recent Labs  Lab 06/05/22 2230  IRON 96  TIBC 203*  FERRITIN 244    Xrays/Other Studies: US RENAL  Result Date: 06/10/2022 CLINICAL DATA:  Acute kidney injury. EXAM: RENAL / URINARY TRACT ULTRASOUND COMPLETE COMPARISON:  July 17, 2010. FINDINGS: Right Kidney: Renal measurements: 9.6 x 4.9 x 4.5 cm = volume: 110 mL. Cortical thinning is noted. Multiple cysts are noted, the largest  measuring 4.3 cm. Echogenicity within normal limits. No mass or hydronephrosis visualized. Left Kidney: Renal measurements: 10.1 x 5.3 x 4.6 cm = volume: 128 mL. Cortical thinning is noted. 1.2 cm cyst is noted. Echogenicity within normal limits. No mass or hydronephrosis visualized. Bladder: Appears normal for degree of bladder distention. Other: None IMPRESSION: Bilateral renal cortical thinning is noted. Bilateral renal cysts are noted. No hydronephrosis or renal obstruction is noted. Electronically Signed   By: Marijo Conception M.D.   On: 06/10/2022 14:42     Assessment/Plan:  AKI/CKD stage IV - initially likely had some volume depletion with watery stools and possible ischemic ATN in setting of ABLA and soft BP's.  Now with possible cardiorenal syndrome.  Last dose of IV lasix was 06/09/22.  She also had some urinary retention but has been started on Flomax and renal US without hydronephrosis.  She believes that furosemide is damaging her kidneys and does not want to have a dose today.  She confirms that she does not want dialysis and understands the consequences if her kidneys were to completely fail.  We did discuss that if her edema worsens, that I would recommend another dose of IV lasix.  Continue  to follow bladder scans and I&O cath as needed.  Currently without uremic symptoms, however would involve palliative care if her renal function continues to decline.  Will urine studies.   Avoid nephrotoxic medications including NSAIDs and iodinated intravenous contrast exposure unless the latter is absolutely indicated.   Preferred narcotic agents for pain control are hydromorphone, fentanyl, and methadone. Morphine should not be used.  Avoid Baclofen and avoid oral sodium phosphate and magnesium citrate based laxatives / bowel preps.  Continue strict Input and Output monitoring.  Will monitor the patient closely with you and intervene or adjust therapy as indicated by changes in clinical status/labs   PNA - likely bacterial and started on azithromycin per primary Covid-19 infection - s/p remdesivir x 3 days, stable on 2 L O2 via [place] per primary Chronic CHF with severe MR and TR, with reduced EF 30-35% and moderately reduced RV function- Cardiology was following.  Plan for outpatient upgrade of PPM to BiV device. Acute on chronic anemia with heme + stools - GI following, but holding off on EGD until her respiratory status improves. Resumed Eliquis per GI. UGIB - as above, transfuse prn. 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 HTN - BP stable DM type 2 - per primary.    Governor Rooks Therasa Lorenzi 06/11/2022, 12:22 PM

## 2022-06-12 DIAGNOSIS — K922 Gastrointestinal hemorrhage, unspecified: Secondary | ICD-10-CM | POA: Diagnosis not present

## 2022-06-12 LAB — BASIC METABOLIC PANEL
Anion gap: 9 (ref 5–15)
BUN: 151 mg/dL — ABNORMAL HIGH (ref 8–23)
CO2: 28 mmol/L (ref 22–32)
Calcium: 8.3 mg/dL — ABNORMAL LOW (ref 8.9–10.3)
Chloride: 95 mmol/L — ABNORMAL LOW (ref 98–111)
Creatinine, Ser: 4.65 mg/dL — ABNORMAL HIGH (ref 0.44–1.00)
GFR, Estimated: 9 mL/min — ABNORMAL LOW (ref >60)
Glucose, Bld: 164 mg/dL — ABNORMAL HIGH (ref 70–99)
Potassium: 4.6 mmol/L (ref 3.5–5.1)
Sodium: 132 mmol/L — ABNORMAL LOW (ref 135–145)

## 2022-06-12 LAB — CBC WITH DIFFERENTIAL/PLATELET
Abs Immature Granulocytes: 0.17 10*3/uL — ABNORMAL HIGH (ref 0.00–0.07)
Basophils Absolute: 0 10*3/uL (ref 0.0–0.1)
Basophils Relative: 0 %
Eosinophils Absolute: 0.1 10*3/uL (ref 0.0–0.5)
Eosinophils Relative: 2 %
HCT: 21.3 % — ABNORMAL LOW (ref 36.0–46.0)
Hemoglobin: 7.3 g/dL — ABNORMAL LOW (ref 12.0–15.0)
Immature Granulocytes: 2 %
Lymphocytes Relative: 5 %
Lymphs Abs: 0.4 10*3/uL — ABNORMAL LOW (ref 0.7–4.0)
MCH: 35.4 pg — ABNORMAL HIGH (ref 26.0–34.0)
MCHC: 34.3 g/dL (ref 30.0–36.0)
MCV: 103.4 fL — ABNORMAL HIGH (ref 80.0–100.0)
Monocytes Absolute: 0.7 10*3/uL (ref 0.1–1.0)
Monocytes Relative: 8 %
Neutro Abs: 7.1 10*3/uL (ref 1.7–7.7)
Neutrophils Relative %: 83 %
Platelets: 228 10*3/uL (ref 150–400)
RBC: 2.06 MIL/uL — ABNORMAL LOW (ref 3.87–5.11)
Smear Review: ADEQUATE
WBC: 8.5 10*3/uL (ref 4.0–10.5)
nRBC: 0 % (ref 0.0–0.2)

## 2022-06-12 LAB — GLUCOSE, CAPILLARY
Glucose-Capillary: 152 mg/dL — ABNORMAL HIGH (ref 70–99)
Glucose-Capillary: 181 mg/dL — ABNORMAL HIGH (ref 70–99)
Glucose-Capillary: 211 mg/dL — ABNORMAL HIGH (ref 70–99)
Glucose-Capillary: 240 mg/dL — ABNORMAL HIGH (ref 70–99)
Glucose-Capillary: 294 mg/dL — ABNORMAL HIGH (ref 70–99)

## 2022-06-12 MED ORDER — FUROSEMIDE 10 MG/ML IJ SOLN
60.0000 mg | Freq: Once | INTRAMUSCULAR | Status: AC
  Administered 2022-06-12: 60 mg via INTRAVENOUS
  Filled 2022-06-12: qty 6

## 2022-06-12 NOTE — TOC Progression Note (Addendum)
Transition of Care [facility]) - Progression Note    Patient Details  Name: Tricia Gonzales MRN: [id number] Date of Birth: February 24, 1941  Transition of Care [facility]) CM/SW [place] Phone Number: 06/12/2022, 12:52 PM  Clinical Narrative:    12:52pm-CSW spoke with [facility] who stated they do not have beds but can check back on Wednesday if patient is still hospitalized. CSW provided other bed offers to daughter. She is on her way to the hospital to talk with patient about the options but is leaning towards [facility] or [facility]. Of note, SNFs may not accept patient prior to 10 days isolation from [place].    -Daughter called CSW back after reviewing list and is requesting 1-[facility], 2-[facility] or [facility]. If they are not available then she would consider [facility] or [facility].    Expected Discharge Plan: [place] Barriers to Discharge: Insurance Authorization, SNF Covid, SNF Pending bed offer  Expected Discharge Plan and Services In-house Referral: Clinical Social Work   Post Acute Care Choice: [place] arrangements for the past 2 months: [place]: PT, Nurse's Aide [place] Agency: [place] Date [place]: 06/08/22   Representative spoke with at [place]: [place] ([place]) Interventions SDOH Screenings   Food Insecurity: No Food Insecurity (06/06/2022)  Housing: Low Risk  (06/06/2022)  Transportation Needs: No Transportation Needs (06/06/2022)  Utilities: Not At Risk (06/06/2022)  Alcohol Screen: Low Risk  (05/22/2022)  Financial Resource Strain: Low Risk  (05/22/2022)  Tobacco Use: Medium Risk (06/06/2022)    Readmission Risk Interventions    08/20/2020    1:20 PM  Readmission Risk Prevention Plan  Transportation Screening Complete  PCP or Specialist Appt within 3-5 Days Complete  HRI or [place] Complete  Social Work  Consult for [place] Planning/Counseling Complete  Palliative Care Screening Not Applicable  Medication Review Press photographer) Complete

## 2022-06-12 NOTE — Progress Notes (Signed)
PROGRESS NOTE        PATIENT DETAILS Name: Tricia Gonzales Age: 81 y.o. Sex: female Date of Birth: 1940-11-20 Admit Date: 06/05/2022 Admitting Physician Rhetta Mura, DO PVX:[id number], Hal Hope, MD  Brief Summary: Patient is a 81 y.o.  female with history of chronic HFrEF, PAF on Eliquis, sick sinus syndrome-s/p PPM placement, chronic hypoxic respiratory failure on home O2, DM-2, CKD 4 who presented with subjective fever-was found to have PNA/COVID-19 infection, melena/GI bleed with acute blood loss anemia and AKI on CKD 4.  Significant events: 11/30-12/07>> hospitalization- acute on chronic HFrEF 12/15>> hospitalization for COVID infection/PNA/possible GI bleed/AKI on CKD 4.  Significant studies: 11/20>> echo: EF 55-37%, RV systolic function moderately reduced. 12/15>> CXR: Retrocardiac consolidation/atelectasis.  Significant microbiology data: 12/15>> COVID PCR: Positive (CT value 26.1) 12/15>> influenza A/B/RSV: Negative  Procedures: None  Consults: GI Cardiology  Subjective:  Patient in bed, appears comfortable, denies any headache, no fever, no chest pain or pressure, no shortness of breath , no abdominal pain. No new focal weakness.    Objective: Vitals: Blood pressure (!) 133/52, pulse 81, temperature 98 F (36.7 C), temperature source Oral, resp. rate 17, height _0  (1.473 m), weight 93.6 kg, SpO2 98 %.   Exam:  Awake Alert, No new F.N deficits, Normal affect [place].AT,PERRAL Supple Neck, No JVD,   Symmetrical Chest wall movement, Good air movement bilaterally, few rales RRR,No Gallops, Rubs or new Murmurs,  +ve B.Sounds, Abd Soft, No tenderness,   No Cyanosis, Clubbing or edema    Assessment/Plan:  Upper GI bleed with acute blood loss anemia No further melanotic stools since presentation (on iron supplementation at home) S/p 2 units of PRBC-this admission, currently stable continue to monitor.  Giving another unit on 06/10/2022  due to AKI, H&H relatively stable. Continue PPI-seen by [facility] no endoscopy is here so far.  Resumed Eliquis 06/09/2022 as per [facility] Dr. Leanna Sato instructions. Closely monitoring CBC and H&H, if drops further we will reconsult GI and hold Eliquis.   Borderline low B12.  Placed on replacement.    AKI on CKD 4 AKI likely hemodynamically mediated due to GI bleed Continue to hold nephrotoxins, 1 unit packed RBC on 06/10/2022, renal ultrasound, some evidence of urinary retention on bladder scan, stable renal ultrasound, added Flomax, urine electrolytes borderline however on exam on 06/11/2022 appears slightly fluid overloaded, nephrology consulted , they recommended Lasix but patient refused, counseled, monitor.   PNA Likely bacterial-perhaps aspiration Obtain SLP eval Although COVID-positive-procalcitonin is elevated-CXR more consistent with bacterial PNA-hence we will continue with antibiotics.   COVID-19 infection Remdesivir x 3 days Procalcitonin elevated Awaiting CRP Stable on 2 L of oxygen-this is her home regimen Hold off on steroids for now   Chronic HFrEF Volume status relatively stable Given ongoing GI bleed/AKI-diuretics were held, low-dose Lasix x 1 on 06/09/2022.  PAF Rate controlled with Coreg Eliquis was on hold, case was discussed with Dr. Alessandra Bevels on 06/07/2022 will resume Eliquis in 2023.  Monitor in telemetry.  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   Weight as of this encounter: 93.6 kg.    Code status:   Code Status: DNR reconfirmed with daughter/patient this morning.  DVT Prophylaxis: Place TED hose Start: 06/11/22 0547 apixaban (ELIQUIS) tablet 2.5 mg Start: 06/09/22 1245 SCDs Start: 06/05/22 2151 apixaban (ELIQUIS) tablet 2.5 mg    Family Communication:   Daughter-[name] updated over the phone on 06/10/22, 06/11/2022, 06/12/22   Disposition Plan: Status is: Inpatient Remains inpatient appropriate because: Upper GI bleed-acute blood loss anemia-PNA-COVID infection-AKI-not yet stable for discharge.   Planned Discharge Destination:Home health   Diet: Diet Order             DIET SOFT Room service appropriate? Yes with Assist; Fluid consistency: Thin  Diet effective now                    MEDICATIONS: Scheduled Meds:  apixaban  2.5 mg Oral BID   carvedilol  12.5 mg Oral BID WC   vitamin B-12  1,000 mcg Oral Daily   febuxostat  40 mg Oral Q M,W,F   insulin aspart  0-9 Units Subcutaneous Q6H   levothyroxine  75 mcg Oral QHS   montelukast  10 mg Oral QHS   multivitamin with minerals  1 tablet Oral Daily   pantoprazole  40 mg Oral BID AC   rOPINIRole  0.5 mg Oral QHS   rosuvastatin  5 mg Oral Daily   tamsulosin  0.4 mg Oral Daily   Continuous Infusions:   PRN Meds:.acetaminophen **OR** acetaminophen, fentaNYL (SUBLIMAZE) injection, HYDROcodone-acetaminophen, naLOXone (NARCAN)  injection, ondansetron (ZOFRAN) IV, sodium chloride   I have personally reviewed following labs and imaging studies  LABORATORY DATA:  Recent Labs  Lab 06/08/22 0435 06/09/22 0507 06/10/22 0325 06/11/22 0400 06/12/22 0402  WBC 7.1 6.4 6.5 8.6 8.5  HGB 8.7* 8.0* 7.7* 8.2* 7.3*  HCT 23.5* 23.2* 23.1* 23.7* 21.3*  PLT 236 225 258 239 228  MCV 102.6* 102.2* 105.0* 99.6 103.4*  MCH 38.0* 35.2* 35.0* 34.5* 35.4*  MCHC 37.0* 34.5  33.3 34.6 34.3  RDW 21.7* 20.1* 20.6* 19.0* Not Measured  LYMPHSABS 0.3* 0.4* 0.4* 0.5* 0.4*  MONOABS 0.6 0.7 0.6 0.6 0.7  EOSABS 0.2 0.2 0.2 0.2 0.1  BASOSABS 0.0 0.0 0.0 0.0 0.0    Recent Labs  Lab 06/05/22 2018 06/06/22 0139 06/06/22 0139 06/06/22 0657 06/06/22 0845 06/07/22 0340 06/08/22 0435 06/09/22 0507 06/10/22 0325 06/11/22 0400 06/12/22 0402  NA 137 138  --   --   --  138 136 135 134* 131* 132*  K 3.1* 3.4*  --  3.4*  --  3.1* 4.2 3.9 4.0 4.6 4.6  CL 90* 91*  --   --   --  94* 95* 96* 94* 94* 95*  CO2 32 32  --   --   --  _1 ANIONGAP 15 15  --   --   --  _2 GLUCOSE 160* 172*  --   --   --  166* 180* 114* 184* 208* 164*  BUN 161* 160*  --   --   --  153* 153* 152* 153* 150* 151*  CREATININE 4.10* 4.00*  --   --   --  3.83* 3.74* 3.72* 3.87* 4.23* 4.65*  AST  24 24  --   --   --   --   --   --   --   --   --   ALT 12 12  --   --   --   --   --   --   --   --   --   ALKPHOS 66 63  --   --   --   --   --   --   --   --   --   BILITOT 1.3* 1.5*  --   --   --   --   --   --   --   --   --   ALBUMIN 2.6* 2.5*  --   --   --  2.4*  --   --   --   --   --   CRP  --   --    < > 14.4* 10.5*  --  9.9* 7.3* 5.9* 4.3*  --   DDIMER  --   --   --  0.83*  --   --   --   --   --   --   --   PROCALCITON  --   --   --  1.73  --   --   --   --   --   --   --   INR 2.1*  --   --   --   --   --   --   --   --   --   --   BNP  --   --   --   --   --   --  2,364.1* 2,453.2* 2,019.1* 2,120.6*  --   MG  --  2.2  --   --   --   --  2.1 2.1 2.2 2.1  --   CALCIUM 8.5* 8.5*  --   --   --  8.5* 8.6* 8.2* 8.2* 8.2* 8.3*   < > = values in this interval not displayed.    RADIOLOGY STUDIES/RESULTS: US RENAL  Result Date: 06/10/2022 CLINICAL DATA:  Acute kidney injury. EXAM: RENAL / URINARY TRACT ULTRASOUND COMPLETE COMPARISON:  July 17, 2010. FINDINGS: Right Kidney: Renal measurements: 9.6 x 4.9 x 4.5 cm = volume: 110 mL. Cortical thinning is noted. Multiple cysts  are noted, the largest measuring 4.3 cm. Echogenicity within normal limits. No mass or hydronephrosis visualized. Left Kidney: Renal measurements: 10.1 x 5.3 x 4.6 cm = volume: 128 mL. Cortical thinning is noted. 1.2 cm cyst is noted. Echogenicity within normal limits. No mass or hydronephrosis visualized. Bladder: Appears normal for degree of bladder distention. Other: None IMPRESSION: Bilateral renal cortical thinning is noted. Bilateral renal cysts are noted. No hydronephrosis or renal obstruction is noted. Electronically Signed   By: Marijo Conception M.D.   On: 06/10/2022 14:42     LOS: 7 days   Signature  -    Lala Lund M.D on 06/12/2022 at 10:18 AM   -  To page go to [email or web address]

## 2022-06-12 NOTE — Progress Notes (Signed)
Patient ID: Tricia Gonzales, female   DOB: [birth year], 81 y.o.   MRN: [id number] S: No new complaints this morning.  Bladder scan with 22m.   O:BP (!) 133/52 (BP Location: Left Arm)   Pulse 81   Temp 98 F (36.7 C) (Oral)   Resp 17   Ht _0  (1.473 m)   Wt 93.6 kg   SpO2 98%   BMI 43.13 kg/m   Intake/Output Summary (Last 24 hours) at 06/12/2022 1128 Last data filed at 06/12/2022 1000 Gross per 24 hour  Intake 360 ml  Output 450 ml  Net -90 ml   Intake/Output: I/O last 3 completed shifts: In: 774.2 [I.V.:404.2; Blood:370] Out: 450 [Urine:450]  Intake/Output this shift:  Total I/O In: 360 [P.O.:360] Out: -  Weight change:  Gen: NAD CVS: RRR Resp: CTA Abd: +_BS, soft, NT/nD Ext: trace pretibial and pre-sacral edema  Recent Labs  Lab 06/05/22 2018 06/06/22 0139 06/06/22 0657 06/07/22 0340 06/08/22 0435 06/09/22 0507 06/10/22 0325 06/11/22 0400 06/12/22 0402  NA 137 138  --  138 136 135 134* 131* 132*  K 3.1* 3.4* 3.4* 3.1* 4.2 3.9 4.0 4.6 4.6  CL 90* 91*  --  94* 95* 96* 94* 94* 95*  CO2 32 32  --  _1 GLUCOSE 160* 172*  --  166* 180* 114* 184* 208* 164*  BUN 161* 160*  --  153* 153* 152* 153* 150* 151*  CREATININE 4.10* 4.00*  --  3.83* 3.74* 3.72* 3.87* 4.23* 4.65*  ALBUMIN 2.6* 2.5*  --  2.4*  --   --   --   --   --   CALCIUM 8.5* 8.5*  --  8.5* 8.6* 8.2* 8.2* 8.2* 8.3*  PHOS  --   --   --  4.5  --   --   --   --   --   AST 24 24  --   --   --   --   --   --   --   ALT 12 12  --   --   --   --   --   --   --    Liver Function Tests: Recent Labs  Lab 06/05/22 2018 06/06/22 0139 06/07/22 0340  AST 24 24  --   ALT 12 12  --   ALKPHOS 66 63  --   BILITOT 1.3* 1.5*  --   PROT 5.5* 5.2*  --   ALBUMIN 2.6* 2.5* 2.4*   No results for input(s): "LIPASE", "AMYLASE" in the last 168 hours. No results for input(s): "AMMONIA" in the last 168 hours. CBC: Recent Labs  Lab 06/08/22 0435 06/09/22 0507 06/10/22 0325 06/11/22 0400 06/12/22 0402   WBC 7.1 6.4 6.5 8.6 8.5  NEUTROABS 5.9 5.0 5.1 7.1 7.1  HGB 8.7* 8.0* 7.7* 8.2* 7.3*  HCT 23.5* 23.2* 23.1* 23.7* 21.3*  MCV 102.6* 102.2* 105.0* 99.6 103.4*  PLT 236 225 258 239 228   Cardiac Enzymes: No results for input(s): "CKTOTAL", "CKMB", "CKMBINDEX", "TROPONINI" in the last 168 hours. CBG: Recent Labs  Lab 06/11/22 1350 06/11/22 1625 06/11/22 1951 06/12/22 0000 06/12/22 0507  GLUCAP 234* 230* 234* 211* 152*    Iron Studies: No results for input(s): "IRON", "TIBC", "TRANSFERRIN", "FERRITIN" in the last 72 hours. Studies/Results: [place]  Result Date: 06/10/2022 CLINICAL DATA:  Acute kidney injury. EXAM: RENAL / URINARY TRACT ULTRASOUND COMPLETE COMPARISON:  July 17, 2010. FINDINGS: Right Kidney: Renal measurements:  9.6 x 4.9 x 4.5 cm = volume: 110 mL. Cortical thinning is noted. Multiple cysts are noted, the largest measuring 4.3 cm. Echogenicity within normal limits. No mass or hydronephrosis visualized. Left Kidney: Renal measurements: 10.1 x 5.3 x 4.6 cm = volume: 128 mL. Cortical thinning is noted. 1.2 cm cyst is noted. Echogenicity within normal limits. No mass or hydronephrosis visualized. Bladder: Appears normal for degree of bladder distention. Other: None IMPRESSION: Bilateral renal cortical thinning is noted. Bilateral renal cysts are noted. No hydronephrosis or renal obstruction is noted. Electronically Signed   By: Marijo Conception M.D.   On: 06/10/2022 14:42    apixaban  2.5 mg Oral BID   carvedilol  12.5 mg Oral BID WC   vitamin B-12  1,000 mcg Oral Daily   febuxostat  40 mg Oral Q M,W,F   furosemide  60 mg Intravenous Once   insulin aspart  0-9 Units Subcutaneous Q6H   levothyroxine  75 mcg Oral QHS   montelukast  10 mg Oral QHS   multivitamin with minerals  1 tablet Oral Daily   pantoprazole  40 mg Oral BID AC   rOPINIRole  0.5 mg Oral QHS   rosuvastatin  5 mg Oral Daily   tamsulosin  0.4 mg Oral Daily    BMET    Component Value Date/Time   NA  132 (L) 06/12/2022 0402   NA 143 05/20/2022 1557   NA 141 10/20/2016 1321   K 4.6 06/12/2022 0402   K 4.9 10/20/2016 1321   CL 95 (L) 06/12/2022 0402   CO2 28 06/12/2022 0402   CO2 28 10/20/2016 1321   GLUCOSE 164 (H) 06/12/2022 0402   GLUCOSE 179 (H) 10/20/2016 1321   BUN 151 (H) 06/12/2022 0402   BUN 76 (HH) 05/20/2022 1557   BUN 57.3 (H) 10/20/2016 1321   CREATININE 4.65 (H) 06/12/2022 0402   CREATININE 2.08 (H) 11/12/2020 1054   CREATININE 1.6 (H) 10/20/2016 1321   CALCIUM 8.3 (L) 06/12/2022 0402   CALCIUM 9.4 10/20/2016 1321   GFRNONAA 9 (L) 06/12/2022 0402   GFRNONAA 24 (L) 11/12/2020 1054   GFRAA 24 (L) 08/05/2020 1620   GFRAA 26 (L) 02/21/2019 1112   CBC    Component Value Date/Time   WBC 8.5 06/12/2022 0402   RBC 2.06 (L) 06/12/2022 0402   HGB 7.3 (L) 06/12/2022 0402   HGB 9.9 (L) 05/19/2022 1146   HGB 9.9 (L) 07/19/2020 1443   HGB 8.4 (L) 06/18/2017 1336   HCT 21.3 (L) 06/12/2022 0402   HCT 31.0 (L) 07/19/2020 1443   HCT 25.9 (L) 06/18/2017 1336   PLT 228 06/12/2022 0402   PLT 165 05/19/2022 1146   PLT 257 07/19/2020 1443   MCV 103.4 (H) 06/12/2022 0402   MCV 97 07/19/2020 1443   MCV 91.4 06/18/2017 1336   MCH 35.4 (H) 06/12/2022 0402   MCHC 34.3 06/12/2022 0402   RDW Not Measured 06/12/2022 0402   RDW 16.4 (H) 07/19/2020 1443   RDW 15.1 (H) 06/18/2017 1336   LYMPHSABS 0.4 (L) 06/12/2022 0402   LYMPHSABS 0.6 (L) 06/18/2017 1336   MONOABS 0.7 06/12/2022 0402   MONOABS 0.5 06/18/2017 1336   EOSABS 0.1 06/12/2022 0402   EOSABS 0.0 06/18/2017 1336   BASOSABS 0.0 06/12/2022 0402   BASOSABS 0.0 06/18/2017 1336    Assessment/Plan:  AKI/CKD stage IV - initially likely had some volume depletion with watery stools and possible ischemic ATN in setting of ABLA and soft BP's.  Now with possible cardiorenal syndrome.  Last dose of IV lasix was 06/09/22.  She also had some urinary retention but has been started on Flomax and renal US without hydronephrosis.  She  believes that furosemide is damaging her kidneys and does not want to have a dose today.  She confirms that she does not want dialysis and understands the consequences if her kidneys were to completely fail.  She is amenable to have IV lasix today since she was oliguric over the past 24 hours.  Continue to follow bladder scans and I&O cath as needed.  Currently without uremic symptoms, however would involve palliative care if her renal function continues to decline.   Avoid nephrotoxic medications including NSAIDs and iodinated intravenous contrast exposure unless the latter is absolutely indicated.   Preferred narcotic agents for pain control are hydromorphone, fentanyl, and methadone. Morphine should not be used.  Avoid Baclofen and avoid oral sodium phosphate and magnesium citrate based laxatives / bowel preps.  Continue strict Input and Output monitoring.  Will monitor the patient closely with you and intervene or adjust therapy as indicated by changes in clinical status/labs  PNA - likely bacterial and started on azithromycin per primary Covid-19 infection - s/p remdesivir x 3 days, stable on 2 L O2 via [place] per primary Chronic CHF with severe MR and TR, with reduced EF 30-35% and moderately reduced RV function- Cardiology was following.  Plan for outpatient upgrade of PPM to BiV device. Acute on chronic anemia with heme + stools - GI following, but holding off on EGD until her respiratory status improves. Resumed Eliquis per GI. UGIB - as above, transfuse prn. 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 HTN - BP stable DM type 2 - per primary.   Donetta Potts, MD [facility]

## 2022-06-13 DIAGNOSIS — K922 Gastrointestinal hemorrhage, unspecified: Secondary | ICD-10-CM | POA: Diagnosis not present

## 2022-06-13 LAB — BASIC METABOLIC PANEL
Anion gap: 12 (ref 5–15)
BUN: 152 mg/dL — ABNORMAL HIGH (ref 8–23)
CO2: 27 mmol/L (ref 22–32)
Calcium: 8.4 mg/dL — ABNORMAL LOW (ref 8.9–10.3)
Chloride: 93 mmol/L — ABNORMAL LOW (ref 98–111)
Creatinine, Ser: 4.37 mg/dL — ABNORMAL HIGH (ref 0.44–1.00)
GFR, Estimated: 10 mL/min — ABNORMAL LOW (ref >60)
Glucose, Bld: 189 mg/dL — ABNORMAL HIGH (ref 70–99)
Potassium: 4.7 mmol/L (ref 3.5–5.1)
Sodium: 132 mmol/L — ABNORMAL LOW (ref 135–145)

## 2022-06-13 LAB — CBC WITH DIFFERENTIAL/PLATELET
Abs Immature Granulocytes: 0.14 10*3/uL — ABNORMAL HIGH (ref 0.00–0.07)
Basophils Absolute: 0 10*3/uL (ref 0.0–0.1)
Basophils Relative: 0 %
Eosinophils Absolute: 0.1 10*3/uL (ref 0.0–0.5)
Eosinophils Relative: 1 %
HCT: 25.1 % — ABNORMAL LOW (ref 36.0–46.0)
Hemoglobin: 8.5 g/dL — ABNORMAL LOW (ref 12.0–15.0)
Immature Granulocytes: 1 %
Lymphocytes Relative: 4 %
Lymphs Abs: 0.4 10*3/uL — ABNORMAL LOW (ref 0.7–4.0)
MCH: 35 pg — ABNORMAL HIGH (ref 26.0–34.0)
MCHC: 33.9 g/dL (ref 30.0–36.0)
MCV: 103.3 fL — ABNORMAL HIGH (ref 80.0–100.0)
Monocytes Absolute: 0.8 10*3/uL (ref 0.1–1.0)
Monocytes Relative: 7 %
Neutro Abs: 10.1 10*3/uL — ABNORMAL HIGH (ref 1.7–7.7)
Neutrophils Relative %: 87 %
Platelets: 261 10*3/uL (ref 150–400)
RBC: 2.43 MIL/uL — ABNORMAL LOW (ref 3.87–5.11)
RDW: 20 % — ABNORMAL HIGH (ref 11.5–15.5)
WBC: 11.6 10*3/uL — ABNORMAL HIGH (ref 4.0–10.5)
nRBC: 0 % (ref 0.0–0.2)

## 2022-06-13 LAB — GLUCOSE, CAPILLARY
Glucose-Capillary: 195 mg/dL — ABNORMAL HIGH (ref 70–99)
Glucose-Capillary: 215 mg/dL — ABNORMAL HIGH (ref 70–99)
Glucose-Capillary: 226 mg/dL — ABNORMAL HIGH (ref 70–99)
Glucose-Capillary: 309 mg/dL — ABNORMAL HIGH (ref 70–99)

## 2022-06-13 MED ORDER — FUROSEMIDE 10 MG/ML IJ SOLN
40.0000 mg | Freq: Once | INTRAMUSCULAR | Status: AC
  Administered 2022-06-13: 40 mg via INTRAVENOUS
  Filled 2022-06-13: qty 4

## 2022-06-13 NOTE — Progress Notes (Signed)
PROGRESS NOTE        PATIENT DETAILS Name: Tricia Gonzales Age: 81 y.o. Sex: female Date of Birth: 10-Sep-1940 Admit Date: 06/05/2022 Admitting Physician Rhetta Mura, DO SWN:IOEVO, Hal Hope, MD  Brief Summary: Patient is a 81 y.o.  female with history of chronic HFrEF, PAF on Eliquis, sick sinus syndrome-s/p PPM placement, chronic hypoxic respiratory failure on home O2, DM-2, CKD 4 who presented with subjective fever-was found to have PNA/COVID-19 infection, melena/GI bleed with acute blood loss anemia and AKI on CKD 4.  Significant events: 11/30-12/07>> hospitalization- acute on chronic HFrEF 12/15>> hospitalization for COVID infection/PNA/possible GI bleed/AKI on CKD 4.  Significant studies: 11/20>> echo: EF 35-00%, RV systolic function moderately reduced. 12/15>> CXR: Retrocardiac consolidation/atelectasis.  Significant microbiology data: 12/15>> COVID PCR: Positive (CT value 26.1) 12/15>> influenza A/B/RSV: Negative  Procedures: None  Consults: GI Cardiology  Subjective:  Patient in bed, appears comfortable, denies any headache, no fever, no chest pain or pressure, no shortness of breath , no abdominal pain. No new focal weakness.    Objective: Vitals: Blood pressure (!) 127/55, pulse 83, temperature 97.8 F (36.6 C), temperature source Oral, resp. rate 18, height _0  (1.473 m), weight 93.6 kg, SpO2 100 %.   Exam:  Awake Alert, No new F.N deficits, Normal affect [place].AT,PERRAL Supple Neck, No JVD,   Symmetrical Chest wall movement, Good air movement bilaterally, CTAB RRR,No Gallops, Rubs or new Murmurs,  +ve B.Sounds, Abd Soft, No tenderness,   No Cyanosis, Clubbing or edema     Assessment/Plan:  Upper GI bleed with acute blood loss anemia No further melanotic stools since presentation (on iron supplementation at home) S/p 2 units of PRBC-this admission, currently stable continue to monitor.  Giving another unit on 06/10/2022  due to AKI, H&H relatively stable. Continue PPI-seen by [facility] no endoscopy is here so far.  Resumed Eliquis 06/09/2022 as per [facility] GI Dr. Leanna Sato instructions. Closely monitoring CBC and H&H, if drops further we will reconsult GI and hold Eliquis.   Borderline low B12.  Placed on replacement.    AKI on CKD 4 AKI likely hemodynamically mediated due to GI bleed Continue to hold nephrotoxins, 1 unit packed RBC on 06/10/2022, renal ultrasound, some evidence of urinary retention on bladder scan, stable renal ultrasound, added Flomax, urine electrolytes borderline however on exam on 06/11/2022 appears slightly fluid overloaded, nephrology consulted , they recommended Lasix but patient refused it on 06/11/2022, counseled, she finally took Lasix on 06/12/2022 with some improvement in her renal function, continue gentle diuresis with Lasix on 06/13/2022 and monitor renal function.   PNA Likely bacterial-perhaps aspiration Obtain SLP eval Although COVID-positive-procalcitonin is elevated-CXR more consistent with bacterial PNA-hence we will continue with antibiotics.   COVID-19 infection Remdesivir x 3 days Procalcitonin elevated Awaiting CRP Stable on 2 L of oxygen-this is her home regimen Hold off on steroids for now   Chronic HFrEF Volume status relatively stable Given ongoing GI bleed/AKI-diuretics were held, low-dose Lasix x 1 on 06/09/2022.  PAF Rate controlled with Coreg Eliquis was on hold, case was discussed with Dr. Alessandra Bevels on 06/07/2022 will resume Eliquis in 2023.  Monitor in telemetry.  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   Weight as of this encounter: 93.6 kg.    Code status:   Code Status: DNR reconfirmed with daughter/patient this morning.  DVT Prophylaxis: Place TED hose Start: 06/11/22 0547 apixaban (ELIQUIS) tablet 2.5 mg Start: 06/09/22 1245 SCDs Start: 06/05/22 2151 apixaban (ELIQUIS) tablet 2.5 mg    Family Communication:   Daughter-[name] updated over the phone on 06/10/22, 06/11/2022, 06/12/22   Disposition Plan: Status is: Inpatient Remains inpatient appropriate because: Upper GI bleed-acute blood loss anemia-PNA-COVID infection-AKI-not yet stable for discharge.   Planned Discharge Destination:Home health   Diet: Diet Order             DIET SOFT Room service appropriate? Yes with Assist; Fluid consistency: Thin  Diet effective now                    MEDICATIONS: Scheduled Meds:  apixaban  2.5 mg Oral BID   carvedilol  12.5 mg Oral BID WC   vitamin B-12  1,000 mcg Oral Daily   febuxostat  40 mg Oral Q M,W,F   insulin aspart  0-9 Units Subcutaneous Q6H   levothyroxine  75 mcg Oral QHS   montelukast  10 mg Oral QHS   multivitamin with minerals  1 tablet Oral Daily   pantoprazole  40 mg Oral BID AC   rOPINIRole  0.5 mg Oral QHS   rosuvastatin  5 mg Oral Daily   tamsulosin  0.4 mg Oral Daily   Continuous Infusions:   PRN Meds:.acetaminophen **OR** acetaminophen, fentaNYL (SUBLIMAZE) injection, HYDROcodone-acetaminophen, naLOXone (NARCAN)  injection, ondansetron (ZOFRAN) IV, sodium chloride   I have personally reviewed following labs and imaging studies  LABORATORY DATA:  Recent Labs  Lab 06/09/22 0507 06/10/22 0325 06/11/22 0400 06/12/22 0402 06/13/22 0509  WBC 6.4 6.5 8.6 8.5 11.6*  HGB 8.0* 7.7* 8.2* 7.3*  8.5*  HCT 23.2* 23.1* 23.7* 21.3* 25.1*  PLT 225 258 239 228 261  MCV 102.2* 105.0* 99.6 103.4* 103.3*  MCH 35.2* 35.0* 34.5* 35.4* 35.0*  MCHC 34.5 33.3 34.6 34.3 33.9  RDW 20.1* 20.6* 19.0* Not Measured 20.0*  LYMPHSABS 0.4* 0.4* 0.5* 0.4* 0.4*  MONOABS 0.7 0.6 0.6 0.7 0.8  EOSABS 0.2 0.2 0.2 0.1 0.1  BASOSABS 0.0 0.0 0.0 0.0 0.0    Recent Labs  Lab 06/07/22 0340 06/08/22 0435 06/09/22 0507 06/10/22 0325 06/11/22 0400 06/12/22 0402 06/13/22 0509  NA 138 136 135 134* 131* 132* 132*  K 3.1* 4.2 3.9 4.0 4.6 4.6 4.7  CL 94* 95* 96* 94* 94* 95* 93*  CO2 _1 ANIONGAP _2 GLUCOSE 166* 180* 114* 184* 208* 164* 189*  BUN 153* 153* 152* 153* 150* 151* 152*  CREATININE 3.83* 3.74* 3.72* 3.87* 4.23* 4.65* 4.37*  ALBUMIN 2.4*  --   --   --   --   --   --   CRP  --  9.9* 7.3* 5.9* 4.3*  --   --   BNP  --  2,364.1* 2,453.2* 2,019.1* 2,120.6*  --   --   MG  --  2.1 2.1 2.2 2.1  --   --  CALCIUM 8.5* 8.6* 8.2* 8.2* 8.2* 8.3* 8.4*    RADIOLOGY STUDIES/RESULTS: No results found.   LOS: 8 days   Signature  -    Lala Lund M.D on 06/13/2022 at 10:16 AM   -  To page go to [email or web address]

## 2022-06-13 NOTE — Progress Notes (Addendum)
Patient ID: Tricia Gonzales, female   DOB: [birth year], 81 y.o.   MRN: [id number] S: No new complaints.  Feels better.  Happy to hear that her Scr improved overnight. O:BP (!) 127/55 (BP Location: Left Arm)   Pulse 83   Temp 97.8 F (36.6 C) (Oral)   Resp 18   Ht _0  (1.473 m)   Wt 93.6 kg   SpO2 100%   BMI 43.13 kg/m   Intake/Output Summary (Last 24 hours) at 06/13/2022 1010 Last data filed at 06/13/2022 0640 Gross per 24 hour  Intake 720 ml  Output --  Net 720 ml   Intake/Output: I/O last 3 completed shifts: In: 1080 [P.O.:1080] Out: 450 [Urine:450]  Intake/Output this shift:  No intake/output data recorded. Weight change:  Gen:NAD CVS: RRR Resp:CTA Abd: +BS, soft, NT/ND Ext: 1+ pretibial edema bilaterally  Recent Labs  Lab 06/07/22 0340 06/08/22 0435 06/09/22 0507 06/10/22 0325 06/11/22 0400 06/12/22 0402 06/13/22 0509  NA 138 136 135 134* 131* 132* 132*  K 3.1* 4.2 3.9 4.0 4.6 4.6 4.7  CL 94* 95* 96* 94* 94* 95* 93*  CO2 _1 GLUCOSE 166* 180* 114* 184* 208* 164* 189*  BUN 153* 153* 152* 153* 150* 151* 152*  CREATININE 3.83* 3.74* 3.72* 3.87* 4.23* 4.65* 4.37*  ALBUMIN 2.4*  --   --   --   --   --   --   CALCIUM 8.5* 8.6* 8.2* 8.2* 8.2* 8.3* 8.4*  PHOS 4.5  --   --   --   --   --   --    Liver Function Tests: Recent Labs  Lab 06/07/22 0340  ALBUMIN 2.4*   No results for input(s): "LIPASE", "AMYLASE" in the last 168 hours. No results for input(s): "AMMONIA" in the last 168 hours. CBC: Recent Labs  Lab 06/09/22 0507 06/10/22 0325 06/11/22 0400 06/12/22 0402 06/13/22 0509  WBC 6.4 6.5 8.6 8.5 11.6*  NEUTROABS 5.0 5.1 7.1 7.1 10.1*  HGB 8.0* 7.7* 8.2* 7.3* 8.5*  HCT 23.2* 23.1* 23.7* 21.3* 25.1*  MCV 102.2* 105.0* 99.6 103.4* 103.3*  PLT 225 258 239 228 261   Cardiac Enzymes: No results for input(s): "CKTOTAL", "CKMB", "CKMBINDEX", "TROPONINI" in the last 168 hours. CBG: Recent Labs  Lab 06/12/22 0507 06/12/22 1157  06/12/22 1644 06/12/22 2341 06/13/22 0504  GLUCAP 152* 181* 240* 294* 195*    Iron Studies: No results for input(s): "IRON", "TIBC", "TRANSFERRIN", "FERRITIN" in the last 72 hours. Studies/Results: No results found.  apixaban  2.5 mg Oral BID   carvedilol  12.5 mg Oral BID WC   vitamin B-12  1,000 mcg Oral Daily   febuxostat  40 mg Oral Q M,W,F   insulin aspart  0-9 Units Subcutaneous Q6H   levothyroxine  75 mcg Oral QHS   montelukast  10 mg Oral QHS   multivitamin with minerals  1 tablet Oral Daily   pantoprazole  40 mg Oral BID AC   rOPINIRole  0.5 mg Oral QHS   rosuvastatin  5 mg Oral Daily   tamsulosin  0.4 mg Oral Daily    BMET    Component Value Date/Time   NA 132 (L) 06/13/2022 0509   NA 143 05/20/2022 1557   NA 141 10/20/2016 1321   K 4.7 06/13/2022 0509   K 4.9 10/20/2016 1321   CL 93 (L) 06/13/2022 0509   CO2 27 06/13/2022 0509   CO2 28 10/20/2016 1321   GLUCOSE  189 (H) 06/13/2022 0509   GLUCOSE 179 (H) 10/20/2016 1321   BUN 152 (H) 06/13/2022 0509   BUN 76 (HH) 05/20/2022 1557   BUN 57.3 (H) 10/20/2016 1321   CREATININE 4.37 (H) 06/13/2022 0509   CREATININE 2.08 (H) 11/12/2020 1054   CREATININE 1.6 (H) 10/20/2016 1321   CALCIUM 8.4 (L) 06/13/2022 0509   CALCIUM 9.4 10/20/2016 1321   GFRNONAA 10 (L) 06/13/2022 0509   GFRNONAA 24 (L) 11/12/2020 1054   GFRAA 24 (L) 08/05/2020 1620   GFRAA 26 (L) 02/21/2019 1112   CBC    Component Value Date/Time   WBC 11.6 (H) 06/13/2022 0509   RBC 2.43 (L) 06/13/2022 0509   HGB 8.5 (L) 06/13/2022 0509   HGB 9.9 (L) 05/19/2022 1146   HGB 9.9 (L) 07/19/2020 1443   HGB 8.4 (L) 06/18/2017 1336   HCT 25.1 (L) 06/13/2022 0509   HCT 31.0 (L) 07/19/2020 1443   HCT 25.9 (L) 06/18/2017 1336   PLT 261 06/13/2022 0509   PLT 165 05/19/2022 1146   PLT 257 07/19/2020 1443   MCV 103.3 (H) 06/13/2022 0509   MCV 97 07/19/2020 1443   MCV 91.4 06/18/2017 1336   MCH 35.0 (H) 06/13/2022 0509   MCHC 33.9 06/13/2022 0509   RDW  20.0 (H) 06/13/2022 0509   RDW 16.4 (H) 07/19/2020 1443   RDW 15.1 (H) 06/18/2017 1336   LYMPHSABS 0.4 (L) 06/13/2022 0509   LYMPHSABS 0.6 (L) 06/18/2017 1336   MONOABS 0.8 06/13/2022 0509   MONOABS 0.5 06/18/2017 1336   EOSABS 0.1 06/13/2022 0509   EOSABS 0.0 06/18/2017 1336   BASOSABS 0.0 06/13/2022 0509   BASOSABS 0.0 06/18/2017 1336    Assessment/Plan:  AKI/CKD stage IV - initially likely had some volume depletion with watery stools and possible ischemic ATN in setting of ABLA and soft BP's.  Now with possible cardiorenal syndrome.  Last dose of IV lasix was 06/09/22.  She also had some urinary retention but has been started on Flomax and renal US without hydronephrosis.  She believes that furosemide is damaging her kidneys and does not want to have a dose today.  She confirms that she does not want dialysis and understands the consequences if her kidneys were to completely fail.  Scr improving, however BUN remains elevated due to UGIB.  Continue to follow bladder scans and I&O cath as needed.  Currently without uremic symptoms, however would involve palliative care if her renal function continues to decline.   Avoid nephrotoxic medications including NSAIDs and iodinated intravenous contrast exposure unless the latter is absolutely indicated.   Preferred narcotic agents for pain control are hydromorphone, fentanyl, and methadone. Morphine should not be used.  Avoid Baclofen and avoid oral sodium phosphate and magnesium citrate based laxatives / bowel preps.  Continue strict Input and Output monitoring.  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  Plan  for outpatient upgrade of PPM to BiV device.  For now dose IV lasix 60 mg as needed. Acute on chronic anemia with heme + stools - GI following, but holding off on EGD until her respiratory status improves. Resumed Eliquis per GI. UGIB - as  above, transfuse prn. 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 HTN - BP stable DM type 2 - per primary.  Disposition - awaiting SNF placement given debilitation.  Donetta Potts, MD [facility]

## 2022-06-13 NOTE — Plan of Care (Signed)

## 2022-06-14 ENCOUNTER — Inpatient Hospital Stay (HOSPITAL_COMMUNITY): Payer: Medicare Other

## 2022-06-14 DIAGNOSIS — K922 Gastrointestinal hemorrhage, unspecified: Secondary | ICD-10-CM | POA: Diagnosis not present

## 2022-06-14 DIAGNOSIS — M7989 Other specified soft tissue disorders: Secondary | ICD-10-CM | POA: Diagnosis not present

## 2022-06-14 LAB — CBC WITH DIFFERENTIAL/PLATELET
Abs Immature Granulocytes: 0.14 10*3/uL — ABNORMAL HIGH (ref 0.00–0.07)
Basophils Absolute: 0 10*3/uL (ref 0.0–0.1)
Basophils Relative: 0 %
Eosinophils Absolute: 0 10*3/uL (ref 0.0–0.5)
Eosinophils Relative: 0 %
HCT: 22.7 % — ABNORMAL LOW (ref 36.0–46.0)
Hemoglobin: 7.5 g/dL — ABNORMAL LOW (ref 12.0–15.0)
Immature Granulocytes: 1 %
Lymphocytes Relative: 2 %
Lymphs Abs: 0.3 10*3/uL — ABNORMAL LOW (ref 0.7–4.0)
MCH: 33.6 pg (ref 26.0–34.0)
MCHC: 33 g/dL (ref 30.0–36.0)
MCV: 101.8 fL — ABNORMAL HIGH (ref 80.0–100.0)
Monocytes Absolute: 1.1 10*3/uL — ABNORMAL HIGH (ref 0.1–1.0)
Monocytes Relative: 8 %
Neutro Abs: 13 10*3/uL — ABNORMAL HIGH (ref 1.7–7.7)
Neutrophils Relative %: 89 %
Platelets: 228 10*3/uL (ref 150–400)
RBC: 2.23 MIL/uL — ABNORMAL LOW (ref 3.87–5.11)
RDW: 19.1 % — ABNORMAL HIGH (ref 11.5–15.5)
WBC: 14.6 10*3/uL — ABNORMAL HIGH (ref 4.0–10.5)
nRBC: 0 % (ref 0.0–0.2)

## 2022-06-14 LAB — GLUCOSE, CAPILLARY
Glucose-Capillary: 145 mg/dL — ABNORMAL HIGH (ref 70–99)
Glucose-Capillary: 189 mg/dL — ABNORMAL HIGH (ref 70–99)
Glucose-Capillary: 361 mg/dL — ABNORMAL HIGH (ref 70–99)
Glucose-Capillary: 245 mg/dL — ABNORMAL HIGH (ref 70–99)
Glucose-Capillary: 393 mg/dL — ABNORMAL HIGH (ref 70–99)

## 2022-06-14 LAB — BASIC METABOLIC PANEL
Anion gap: 13 (ref 5–15)
BUN: 152 mg/dL — ABNORMAL HIGH (ref 8–23)
CO2: 27 mmol/L (ref 22–32)
Calcium: 8.7 mg/dL — ABNORMAL LOW (ref 8.9–10.3)
Chloride: 93 mmol/L — ABNORMAL LOW (ref 98–111)
Creatinine, Ser: 4.32 mg/dL — ABNORMAL HIGH (ref 0.44–1.00)
GFR, Estimated: 10 mL/min — ABNORMAL LOW (ref >60)
Glucose, Bld: 171 mg/dL — ABNORMAL HIGH (ref 70–99)
Potassium: 5.1 mmol/L (ref 3.5–5.1)
Sodium: 133 mmol/L — ABNORMAL LOW (ref 135–145)

## 2022-06-14 LAB — URIC ACID: Uric Acid, Serum: 9.2 mg/dL — ABNORMAL HIGH (ref 2.5–7.1)

## 2022-06-14 MED ORDER — DICLOFENAC SODIUM 1 % EX GEL
2.0000 g | Freq: Four times a day (QID) | CUTANEOUS | Status: DC
  Administered 2022-06-14 – 2022-06-19 (×18): 2 g via TOPICAL
  Filled 2022-06-14: qty 100

## 2022-06-14 MED ORDER — HYDROMORPHONE HCL 1 MG/ML IJ SOLN
0.5000 mg | Freq: Once | INTRAMUSCULAR | Status: AC | PRN
  Administered 2022-06-14: 0.5 mg via INTRAVENOUS
  Filled 2022-06-14: qty 0.5

## 2022-06-14 MED ORDER — METHYLPREDNISOLONE SODIUM SUCC 40 MG IJ SOLR
40.0000 mg | Freq: Once | INTRAMUSCULAR | Status: AC
  Administered 2022-06-14: 40 mg via INTRAVENOUS
  Filled 2022-06-14: qty 1

## 2022-06-14 NOTE — Progress Notes (Signed)
Orthopedic Tech Progress Note Patient Details:  Tricia Gonzales 1941-04-05 [id number]  Brace applied to L wrist and I elevated her arm on two pillows. All needs met at this time and call bell is in R hand.   Ortho Devices Type of Ortho Device: Velcro wrist forearm splint Ortho Device/Splint Location: LUE Ortho Device/Splint Interventions: Ordered, Application, Adjustment   Post Interventions Patient Tolerated: Fair Instructions Provided: Care of device, Adjustment of device  Meisha Salone Jeri Modena 06/14/2022, 2:14 PM

## 2022-06-14 NOTE — Progress Notes (Signed)
PROGRESS NOTE        PATIENT DETAILS Name: Tricia Gonzales Age: 81 y.o. Sex: female Date of Birth: [birth year] Admit Date: 06/05/2022 Admitting Physician Rhetta Mura, DO WUX:LKGMW, Hal Hope, MD  Brief Summary: Patient is a 81 y.o.  female with history of chronic HFrEF, PAF on Eliquis, sick sinus syndrome-s/p PPM placement, chronic hypoxic respiratory failure on home O2, DM-2, CKD 4 who presented with subjective fever-was found to have PNA/COVID-19 infection, melena/GI bleed with acute blood loss anemia and AKI on CKD 4.  Significant events: 11/30-12/07>> hospitalization- acute on chronic HFrEF 12/15>> hospitalization for COVID infection/PNA/possible GI bleed/AKI on CKD 4.  Significant studies: 11/20>> echo: EF 10-27%, RV systolic function moderately reduced. 12/15>> CXR: Retrocardiac consolidation/atelectasis.  Significant microbiology data: 12/15>> COVID PCR: Positive (CT value 26.1) 12/15>> influenza A/B/RSV: Negative  Procedures: None  Consults: GI Cardiology Hand   Subjective:  Patient in bed, appears comfortable, denies any headache, no fever, no chest pain or pressure, no shortness of breath , no abdominal pain. No new focal weakness.  Objective: Vitals: Blood pressure 105/64, pulse 87, temperature 98.2 F (36.8 C), temperature source Oral, resp. rate 18, height _0  (1.473 m), weight 95 kg, SpO2 96 %.   Exam:  Awake Alert, No new F.N deficits, Normal affect [place].AT,PERRAL Supple Neck, No JVD,   Symmetrical Chest wall movement, Good air movement bilaterally, CTAB RRR,No Gallops, Rubs or new Murmurs,  +ve B.Sounds, Abd Soft, No tenderness,   L.Hand and wrist swollen and tender.   Assessment/Plan:  Upper GI bleed with acute blood loss anemia No further melanotic stools since presentation (on iron supplementation at home) S/p 2 units of PRBC-this admission, currently stable continue to monitor.  Giving another unit on 06/10/2022  due to AKI, H&H relatively stable. Continue PPI-seen by [facility] no endoscopy is here so far.  Resumed Eliquis 06/09/2022 as per [facility] GI Dr. Leanna Sato instructions. Closely monitoring CBC and H&H, if drops further we will reconsult GI and hold Eliquis.   Borderline low B12.  Placed on replacement.    AKI on CKD 4 AKI likely hemodynamically mediated due to GI bleed Continue to hold nephrotoxins, 1 unit packed RBC on 06/10/2022, renal ultrasound, some evidence of urinary retention on bladder scan, stable renal ultrasound, added Flomax, urine electrolytes borderline however on exam on 06/11/2022 appears slightly fluid overloaded, nephrology consulted , they recommended Lasix but patient refused it on 06/11/2022, counseled, she finally took Lasix on 06/12/2022 with some improvement in her renal function, continue gentle diuresis with Lasix on 06/13/2022 and monitor renal function.  Acute left wrist pain starting night of 06/13/2022 according to the patient after she moved out of bed.  Question hemarthrosis, gout versus infectious arthritis.  Obtain x-rays of the left hand and wrist, hand consult requested by Dr. Burney Gauze, will check uric acid and give 1 dose of IV Solu-Medrol.  Bengay cream, wrist splint and monitor.   PNA Likely bacterial-perhaps aspiration By speech this problem has resolved.  COVID-19 infection Problem is resolved.   Chronic HFrEF Volume status relatively stable Given ongoing GI bleed/AKI-diuretics were held, low-dose Lasix x 1 on 06/09/2022.  PAF Rate controlled with Coreg Eliquis resumed after clearance from GI  History of sick sinus syndrome/complete heart block-s/p PPM placement Telemetry monitoring  Group 2 pulmonary hypertension Moderate-severe MR Severe TR Chronic hypoxic respiratory failure on 2  L of oxygen at home Telemetry monitoring Chronic issues Cardiology following  Hypokalemia Replete/recheck  HTN BP stable Continue Coreg  cautiously Imdur/hydralazine on hold-due to ongoing GI bleed  HLD Continue statin  Hypothyroidism Continue Synthroid Recent TSH 12/1 stable  Obesity: Estimated body mass index is 43.77 kg/m as calculated from the following:   Height as of this encounter: _0  (1.473 m).   Weight as of this encounter: 95 kg.    Code status:   Code Status: DNR reconfirmed with daughter/patient this morning.  DVT Prophylaxis: Place TED hose Start: 06/11/22 0547 apixaban (ELIQUIS) tablet 2.5 mg Start: 06/09/22 1245 SCDs Start: 06/05/22 2151 apixaban (ELIQUIS) tablet 2.5 mg    Family Communication:   Daughter-[name]-[phone] updated over the phone on 06/10/22, 06/11/2022, 06/12/22   Disposition Plan: Status is: Inpatient Remains inpatient appropriate because: Upper GI bleed-acute blood loss anemia-PNA-COVID infection-AKI-not yet stable for discharge.   Planned Discharge Destination:Home health   Diet: Diet Order             DIET SOFT Room service appropriate? Yes with Assist; Fluid consistency: Thin  Diet effective now                    MEDICATIONS: Scheduled Meds:  apixaban  2.5 mg Oral BID   carvedilol  12.5 mg Oral BID WC   vitamin B-12  1,000 mcg Oral Daily   diclofenac Sodium  2 g Topical QID   febuxostat  40 mg Oral Q M,W,F   insulin aspart  0-9 Units Subcutaneous Q6H   levothyroxine  75 mcg Oral QHS   methylPREDNISolone (SOLU-MEDROL) injection  40 mg Intravenous Once   montelukast  10 mg Oral QHS   multivitamin with minerals  1 tablet Oral Daily   pantoprazole  40 mg Oral BID AC   rOPINIRole  0.5 mg Oral QHS   rosuvastatin  5 mg Oral Daily   tamsulosin  0.4 mg Oral Daily   Continuous Infusions:   PRN Meds:.acetaminophen **OR** acetaminophen, HYDROcodone-acetaminophen, naLOXone (NARCAN)  injection, ondansetron (ZOFRAN) IV, sodium chloride   I have personally reviewed following labs and imaging studies  LABORATORY DATA:  Recent Labs  Lab  06/10/22 0325 06/11/22 0400 06/12/22 0402 06/13/22 0509 06/14/22 0647  WBC 6.5 8.6 8.5 11.6* 14.6*  HGB 7.7* 8.2* 7.3* 8.5* 7.5*  HCT 23.1* 23.7* 21.3* 25.1* 22.7*  PLT 258 239 228 261 228  MCV 105.0* 99.6 103.4* 103.3* 101.8*  MCH 35.0* 34.5* 35.4* 35.0* 33.6  MCHC 33.3 34.6 34.3 33.9 33.0  RDW 20.6* 19.0* Not Measured 20.0* 19.1*  LYMPHSABS 0.4* 0.5* 0.4* 0.4* 0.3*  MONOABS 0.6 0.6 0.7 0.8 1.1*  EOSABS 0.2 0.2 0.1 0.1 0.0  BASOSABS 0.0 0.0 0.0 0.0 0.0    Recent Labs  Lab 06/08/22 0435 06/09/22 0507 06/10/22 0325 06/11/22 0400 06/12/22 0402 06/13/22 0509 06/14/22 0647  NA 136 135 134* 131* 132* 132* 133*  K 4.2 3.9 4.0 4.6 4.6 4.7 5.1  CL 95* 96* 94* 94* 95* 93* 93*  CO2 _1 ANIONGAP _2 GLUCOSE 180* 114* 184* 208* 164* 189* 171*  BUN 153* 152* 153* 150* 151* 152* 152*  CREATININE 3.74* 3.72* 3.87* 4.23* 4.65* 4.37* 4.32*  CRP 9.9* 7.3* 5.9* 4.3*  --   --   --   BNP 2,364.1* 2,453.2* 2,019.1* 2,120.6*  --   --   --   MG 2.1 2.1 2.2 2.1  --   --   --  CALCIUM 8.6* 8.2* 8.2* 8.2* 8.3* 8.4* 8.7*    RADIOLOGY STUDIES/RESULTS: DG Hand Complete Left  Result Date: 06/14/2022 CLINICAL DATA:  Left hand pain EXAM: LEFT HAND - COMPLETE 3+ VIEW COMPARISON:  Left hand radiograph 11/13/2021 FINDINGS: There is severe first carpometacarpal joint osteoarthritis with bulky osteophyte formation, radial subluxation, and bony remodeling of the trapezium, similar to prior. Moderate triscaphe degenerative change. There is severe joint space narrowing and central erosive change at the index finger distal interphalangeal joint and small finger proximal phalangeal joint. There is moderate osteoarthritis of the other interphalangeal joints, prominent at the middle, ring, and small finger distal interphalangeal joints. There are angulation deformities of the index, middle, and small fingers. There is generalized soft tissue swelling of the hand. No periostitis.  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 Degree of arthritis is overall similar to prior exam in May 2023. There is no evidence of acute fracture. 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 Moderate osteoarthritis of the other interphalangeal joints. Degree of arthritis is overall similar to prior exam in May 2023. 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 Diffuse soft tissue swelling of the hand. No evidence of acute fracture. Electronically Signed   By: Maurine Simmering M.D.   On: 06/14/2022 08:40     LOS: 9 days   Signature  -    Lala Lund M.D on 06/14/2022 at 10:15 AM   -  To page go to [email or web address]

## 2022-06-14 NOTE — Progress Notes (Signed)
Occupational Therapy Treatment Patient Details Name: Tricia Gonzales MRN: [id number] DOB: Feb 11, 1941 Today's Date: 06/14/2022   History of present illness 81 yo female admitted with fever, general malaise, and dark stools; Covid +; GI on board; with medical history significant for chronic biventricular systolic heart failure, paroxysmal atrial fibrillation complicated by sick sinus syndrome status post pacemaker placement, chronically anticoagulated on Eliquis, chronic hypoxic respiratory failure on 2 L continuous nasal cannula, type 2 diabetes mellitus, CKD 4, anemia   OT comments  Patient requiring increased time for mobility and transfers due to pain at LUE hand and wrist. Patient demonstrating edema at left hand and sensitive to touch. Use of bed pad to assist with bed mobility due to difficulty using LUE. Limited treatment to toilet transfer and back to bed to allow for positioning of LUE. Patient to continue to be followed by acute OT for self care and functional transfers.    Recommendations for follow up therapy are one component of a multi-disciplinary discharge planning process, led by the attending physician.  Recommendations may be updated based on patient status, additional functional criteria and insurance authorization.    Follow Up Recommendations  No OT follow up (pt declining - would benefit fromHHOT)     Assistance Recommended at Discharge Intermittent Supervision/Assistance  Patient can return home with the following  A little help with walking and/or transfers;A lot of help with bathing/dressing/bathroom;Assistance with cooking/housework;Assist for transportation;Help with stairs or ramp for entrance   Equipment Recommendations  BSC/3in1    Recommendations for Other Services      Precautions / Restrictions Precautions Precautions: Fall;Other (comment) Precaution Comments: Watch SpO2 (does not wear O2 baseline) COVID + Restrictions Weight Bearing Restrictions: No        Mobility Bed Mobility Overal bed mobility: Needs Assistance Bed Mobility: Supine to Sit, Sit to Supine     Supine to sit: Mod assist Sit to supine: Mod assist   General bed mobility comments: assistance wtih trunk and hips due to unable to use LUE to assist    Transfers Overall transfer level: Needs assistance Equipment used: None Transfers: Sit to/from Stand, Bed to chair/wheelchair/BSC Sit to Stand: Min assist Stand pivot transfers: Min assist         General transfer comment: transfer performed from EOB to [facility] and back with use of bed rail for support with balance.     Balance Overall balance assessment: Needs assistance Sitting-balance support: Feet supported Sitting balance-Leahy Scale: Good Sitting balance - Comments: able to maintain sitting balance on EOB   Standing balance support: Single extremity supported, During functional activity Standing balance-Leahy Scale: Poor Standing balance comment: single extremity support for standing due to unable to use LUE to assist                           ADL either performed or assessed with clinical judgement   ADL Overall ADL's : Needs assistance/impaired     Grooming: Wash/dry hands;Wash/dry face;Set up;Sitting                   Toilet Transfer: Minimal assistance;Stand-pivot;BSC/3in1 Toilet Transfer Details (indicate cue type and reason): Without RW due to pain at LUE wrist/hand Toileting- Clothing Manipulation and Hygiene: Maximal assistance;Sit to/from stand Toileting - Clothing Manipulation Details (indicate cue type and reason): reliant on RUE support when standing and unable to perform toilet hygiene       General ADL Comments: patient limited by pain at LUE  Extremity/Trunk Assessment              Vision       Perception     Praxis      Cognition Arousal/Alertness: Awake/alert Behavior During Therapy: WFL for tasks assessed/performed Overall Cognitive Status:  Within Functional Limits for tasks assessed                                          Exercises      Shoulder Instructions       General Comments SpO2 96%    Pertinent Vitals/ Pain       Pain Assessment Pain Assessment: Faces Faces Pain Scale: Hurts even more Pain Location: Left hand Pain Descriptors / Indicators: Aching, Discomfort, Grimacing, Guarding, Sore Pain Intervention(s): Limited activity within patient's tolerance, Monitored during session, Repositioned  Home Living                                          Prior Functioning/Environment              Frequency  Min 2X/week        Progress Toward Goals  OT Goals(current goals can now be found in the care plan section)  Progress towards OT goals: Progressing toward goals  Acute Rehab OT Goals Patient Stated Goal: go home OT Goal Formulation: With patient Time For Goal Achievement: 06/22/22 Potential to Achieve Goals: Good ADL Goals Pt Will Perform Grooming: with modified independence;sitting Pt Will Perform Upper Body Dressing: with modified independence;sitting Pt Will Perform Lower Body Dressing: with supervision;sit to/from stand Pt Will Transfer to Toilet: with supervision;ambulating;regular height toilet Pt Will Perform Toileting - Clothing Manipulation and hygiene: with supervision;sit to/from stand Additional ADL Goal #1: Pt will perform bed mobility at Mod I level prior to engaging in ADL Additional ADL Goal #2: Pt will recall at least 3 energy conservation strategies to use during ADL with no cues  Plan Discharge plan remains appropriate    Co-evaluation                 AM-PAC OT "6 Clicks" Daily Activity     Outcome Measure   Help from another person eating meals?: None Help from another person taking care of personal grooming?: A Little Help from another person toileting, which includes using toliet, bedpan, or urinal?: A Lot Help from another  person bathing (including washing, rinsing, drying)?: A Lot Help from another person to put on and taking off regular upper body clothing?: A Little Help from another person to put on and taking off regular lower body clothing?: A Lot 6 Click Score: 16    End of Session Equipment Utilized During Treatment: Oxygen  OT Visit Diagnosis: Unsteadiness on feet (R26.81);Muscle weakness (generalized) (M62.81);Pain Pain - Right/Left: Left Pain - part of body: Hand   Activity Tolerance Patient limited by pain   Patient Left in bed;with bed alarm set;with call bell/phone within reach   Nurse Communication Mobility status        Time: 0832-0909 OT Time Calculation (min): 37 min  Charges: OT General Charges $OT Visit: 1 Visit OT Treatments $Self Care/Home Management : 23-37 mins  Lodema Hong, [place]  [facility] [place] 06/14/2022, 11:15 AM

## 2022-06-14 NOTE — Consult Note (Signed)
Called to see patient for acute left wrist pain and swelling with no antecedent trauma.  Patient is a very pleasant 81 year old female admitted for medical reasons who presented this morning with new onset pain and swelling dorsal aspect of her wrist on the left.  She is there with her family member.  She describes her pain as a throbbing type pain with associated swelling.  She denies any trauma.  Examination reveals a well-developed well-nourished female lying in bed.  She is in no acute distress.  She is alert and oriented x 3.  She has full range of motion cervical spine with no pain or radiculopathy noted.  Left upper extremity exam reveals tenderness and swelling and mild erythema over the dorsal aspect of the hand and wrist on the left.  There is no drainage.  There is no lymphadenopathy.  There is no streaking up the arm.  She is painful deep palpation only.  She has obvious osteoarthritic changes in her hands and wrist bilaterally.  Again there is no drainage or evidence of ascending infection.  Distally on the right and left there is no other signs of neurovascular compromise or compression neuropathy.  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  Impression is an 81 year old female with significant arthritis involving her hands bilaterally with acute onset pain and swelling on the dorsal aspect of her hand and wrist on the left.  At this point, recommend elevation with ice and antibiotics and anti-inflammatories.  Will reevaluate in 24 hours to see the need for any further intervention.  Most likely a flare of underlying gouty arthropathy and/or arthritis.  Could also be hemarthrosis from minimal trauma.

## 2022-06-14 NOTE — Progress Notes (Signed)
Bilateral upper extremity venous duplex has been completed. Preliminary results can be found in CV Proc through chart review.   06/14/22 2:32 PM Tricia Gonzales RVT

## 2022-06-15 DIAGNOSIS — K922 Gastrointestinal hemorrhage, unspecified: Secondary | ICD-10-CM | POA: Diagnosis not present

## 2022-06-15 LAB — GLUCOSE, CAPILLARY
Glucose-Capillary: 218 mg/dL — ABNORMAL HIGH (ref 70–99)
Glucose-Capillary: 234 mg/dL — ABNORMAL HIGH (ref 70–99)
Glucose-Capillary: 280 mg/dL — ABNORMAL HIGH (ref 70–99)
Glucose-Capillary: 351 mg/dL — ABNORMAL HIGH (ref 70–99)
Glucose-Capillary: 389 mg/dL — ABNORMAL HIGH (ref 70–99)
Glucose-Capillary: 437 mg/dL — ABNORMAL HIGH (ref 70–99)
Glucose-Capillary: 444 mg/dL — ABNORMAL HIGH (ref 70–99)

## 2022-06-15 LAB — BASIC METABOLIC PANEL
Anion gap: 12 (ref 5–15)
BUN: 157 mg/dL — ABNORMAL HIGH (ref 8–23)
CO2: 25 mmol/L (ref 22–32)
Calcium: 8.5 mg/dL — ABNORMAL LOW (ref 8.9–10.3)
Chloride: 96 mmol/L — ABNORMAL LOW (ref 98–111)
Creatinine, Ser: 3.99 mg/dL — ABNORMAL HIGH (ref 0.44–1.00)
GFR, Estimated: 11 mL/min — ABNORMAL LOW (ref >60)
Glucose, Bld: 422 mg/dL — ABNORMAL HIGH (ref 70–99)
Potassium: 5.3 mmol/L — ABNORMAL HIGH (ref 3.5–5.1)
Sodium: 133 mmol/L — ABNORMAL LOW (ref 135–145)

## 2022-06-15 LAB — CBC WITH DIFFERENTIAL/PLATELET
Abs Immature Granulocytes: 0.05 10*3/uL (ref 0.00–0.07)
Basophils Absolute: 0 10*3/uL (ref 0.0–0.1)
Basophils Relative: 0 %
Eosinophils Absolute: 0 10*3/uL (ref 0.0–0.5)
Eosinophils Relative: 0 %
HCT: 19 % — ABNORMAL LOW (ref 36.0–46.0)
Hemoglobin: 6.5 g/dL — CL (ref 12.0–15.0)
Immature Granulocytes: 1 %
Lymphocytes Relative: 2 %
Lymphs Abs: 0.1 10*3/uL — ABNORMAL LOW (ref 0.7–4.0)
MCH: 35.5 pg — ABNORMAL HIGH (ref 26.0–34.0)
MCHC: 34.2 g/dL (ref 30.0–36.0)
MCV: 103.8 fL — ABNORMAL HIGH (ref 80.0–100.0)
Monocytes Absolute: 0.2 10*3/uL (ref 0.1–1.0)
Monocytes Relative: 2 %
Neutro Abs: 6.6 10*3/uL (ref 1.7–7.7)
Neutrophils Relative %: 95 %
Platelets: 182 10*3/uL (ref 150–400)
RBC: 1.83 MIL/uL — ABNORMAL LOW (ref 3.87–5.11)
RDW: 19.9 % — ABNORMAL HIGH (ref 11.5–15.5)
WBC: 7 10*3/uL (ref 4.0–10.5)
nRBC: 0 % (ref 0.0–0.2)

## 2022-06-15 LAB — URIC ACID: Uric Acid, Serum: 8.7 mg/dL — ABNORMAL HIGH (ref 2.5–7.1)

## 2022-06-15 LAB — PREPARE RBC (CROSSMATCH)

## 2022-06-15 MED ORDER — FUROSEMIDE 10 MG/ML IJ SOLN
60.0000 mg | Freq: Once | INTRAMUSCULAR | Status: AC
  Administered 2022-06-15: 60 mg via INTRAVENOUS
  Filled 2022-06-15: qty 6

## 2022-06-15 MED ORDER — INSULIN ASPART 100 UNIT/ML IJ SOLN
0.0000 [IU] | Freq: Three times a day (TID) | INTRAMUSCULAR | Status: DC
  Administered 2022-06-15 (×2): 5 [IU] via SUBCUTANEOUS
  Administered 2022-06-16: 11 [IU] via SUBCUTANEOUS
  Administered 2022-06-16 – 2022-06-17 (×2): 5 [IU] via SUBCUTANEOUS
  Administered 2022-06-17 – 2022-06-19 (×4): 3 [IU] via SUBCUTANEOUS
  Administered 2022-06-19: 2 [IU] via SUBCUTANEOUS

## 2022-06-15 MED ORDER — COLCHICINE 0.3 MG HALF TABLET
0.3000 mg | ORAL_TABLET | Freq: Every day | ORAL | Status: AC
  Administered 2022-06-15: 0.3 mg via ORAL
  Filled 2022-06-15: qty 1

## 2022-06-15 MED ORDER — SODIUM ZIRCONIUM CYCLOSILICATE 10 G PO PACK
10.0000 g | PACK | Freq: Two times a day (BID) | ORAL | Status: AC
  Administered 2022-06-15 (×2): 10 g via ORAL
  Filled 2022-06-15 (×2): qty 1

## 2022-06-15 MED ORDER — INSULIN GLARGINE-YFGN 100 UNIT/ML ~~LOC~~ SOLN
8.0000 [IU] | Freq: Once | SUBCUTANEOUS | Status: AC
  Administered 2022-06-15: 8 [IU] via SUBCUTANEOUS
  Filled 2022-06-15: qty 0.08

## 2022-06-15 MED ORDER — INSULIN ASPART 100 UNIT/ML IJ SOLN
0.0000 [IU] | INTRAMUSCULAR | Status: DC
  Administered 2022-06-15 (×2): 15 [IU] via SUBCUTANEOUS

## 2022-06-15 MED ORDER — APIXABAN 2.5 MG PO TABS: 2.5000 mg | ORAL_TABLET | Freq: Two times a day (BID) | ORAL | Status: DC

## 2022-06-15 MED ORDER — SODIUM CHLORIDE 0.9% IV SOLUTION: Freq: Once | INTRAVENOUS | Status: AC

## 2022-06-15 NOTE — Progress Notes (Signed)
[facility] Progress Note  SUBJECTIVE:   Interval history: [facility] requested to reevaluate patient given continued melena and downtrending hemoglobin.  Tricia Gonzales is an 81 year old female with past medical history significant for atrial fibrillation on Eliquis, sick sinus syndrome, moderate/severe mitral regurgitation, chronic kidney disease, anemia in chronic kidney disease, nephrolithiasis.  She was admitted to [facility] from 05/21/2022 - 75/02/1637 for acute systolic heart failure.  She presented to [facility] emergency department on 06/05/2022 with fever, congestion, cough and dark stool.  She was initially seen evaluated by [facility] on 06/06/2022 by Dr. Alessandra Bevels -given multiple comorbidities with chronic respiratory failure, CHF and COVID-19 pneumonia recommendations were for supportive care with IV twice daily PPI.  Of note, underwent EGD in February 2022 (Dr. 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  Hemoglobin 6.5 today (was 6.9 on admission, between 7-9 during admission, baseline appears to be 9 in November 2023), she is pending blood product transfusion.  WBC 7.0, platelets 182, BUN/creatinine 157/3.99, sodium 133, potassium 5.3.  No abdominal imaging since admission.  COVID precautions have been lifted.  Patient noted that she had a brown/black bowel movement this morning.  She denied chest pain.  She noted having some shortness of breath.  She has chronic dysphagia to solids, liquids and pills.  She denied feeling significant symptom improvement in her swallowing after balloon dilation during EGD in 2022.  She has chronically altered bowel habits, loose and, has followed with Dr. Therisa Doyne in the outpatient setting.  She has attempted MiraLAX as well as Benefiber with no significant relief in discomfort.  She has diffuse abdominal discomfort.  She had some  choking sensation this a.m. and requested her supplemental oxygen be increased she is on nasal cannula 4 L at time of my evaluation.  She noted that she is on 2 L supplemental oxygen nasal cannula prior to admission.  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   Anxiety    Arthritis    Atrial fibrillation ([place])    Avascular necrosis (HCC) hip left   leg pain also   Chronic back pain    Chronic kidney disease (CKD), stage III (moderate) (HCC)    Depression    Diverticulosis    Early cataracts, bilateral    Esophageal stricture    Gastritis    GAVE (gastric antral vascular ectasia)    GERD (gastroesophageal reflux disease)    Glaucoma    Heart murmur     " some doctors say that I have one some say that I don"t "   Hiatal hernia    History of blood transfusion    "@ least w/1st knee OR"   History of gout    History of kidney stones    Hyperlipidemia    Hypertension    Intestinal obstruction ([place])    Kidney stones    Obesity    Osteoarthritis    Osteoporosis    Pericarditis 2016   Presence of permanent cardiac pacemaker    Sjogren's disease ([place])    Thyroid disease    Type II diabetes mellitus ([place])    Vitamin B12 deficiency    Past Surgical History:  Procedure Laterality Date   ABDOMINAL HYSTERECTOMY     complete   CARDIOVERSION N/A 07/31/2020   Procedure: CARDIOVERSION;  Surgeon: Werner Lean, MD;  [facility]: [facility];  Service: Cardiovascular;  Laterality: N/A;   CHOLECYSTECTOMY OPEN     COLECTOMY     for rectovaginal fistula   COLONOSCOPY  01/07/2012   Procedure: COLONOSCOPY;  Surgeon: Lafayette Dragon, MD;  Location: [facility];  Service: Endoscopy;  Laterality: N/A;   EP IMPLANTABLE DEVICE N/A 06/18/2015   Procedure: Pacemaker Implant;  Surgeon: Evans Lance, MD;  Location: [place] [facility];  Service: Cardiovascular;  Laterality: N/A;   ESOPHAGEAL DILATION  08/17/2020   Procedure: ESOPHAGEAL  DILATION;  Surgeon: Lavena Bullion, DO;  Location: [place] ENDOSCOPY;  Service: Gastroenterology;;   ESOPHAGOGASTRODUODENOSCOPY N/A 11/24/2018   Procedure: ESOPHAGOGASTRODUODENOSCOPY (EGD);  Surgeon: Yetta Flock, MD;  Location: [facility];  Service: Gastroenterology;  Laterality: N/A;   ESOPHAGOGASTRODUODENOSCOPY (EGD) WITH ESOPHAGEAL DILATION     ESOPHAGOGASTRODUODENOSCOPY (EGD) WITH PROPOFOL N/A 08/12/2015   Procedure: ESOPHAGOGASTRODUODENOSCOPY (EGD) WITH PROPOFOL;  Surgeon: Manus Gunning, MD;  Location: [place];  Service: Gastroenterology;  Laterality: N/A;   ESOPHAGOGASTRODUODENOSCOPY (EGD) WITH PROPOFOL N/A 08/17/2020   Procedure: ESOPHAGOGASTRODUODENOSCOPY (EGD) WITH PROPOFOL;  Surgeon: Lavena Bullion, DO;  Location: [place];  Service: Gastroenterology;  Laterality: N/A;   EYE SURGERY Bilateral    cataracts   HOT HEMOSTASIS N/A 11/24/2018   Procedure: HOT HEMOSTASIS (ARGON PLASMA COAGULATION/BICAP);  Surgeon: Yetta Flock, MD;  Location: [facility];  Service: Gastroenterology;  Laterality: N/A;   HOT HEMOSTASIS N/A 08/17/2020   Procedure: HOT HEMOSTASIS (ARGON PLASMA COAGULATION/BICAP);  Surgeon: Lavena Bullion, DO;  Location: [facility] ENDOSCOPY;  Service: Gastroenterology;  Laterality: N/A;   I & D EXTREMITY Right 08/06/2016   Procedure: DEBRIDEMENT PIP RIGHT RING FINGER;  Surgeon: Daryll Brod, MD;  Location: [place];  Service: Orthopedics;  Laterality: Right;   INSERT / REPLACE / REMOVE PACEMAKER     JOINT REPLACEMENT     KNEE ARTHROSCOPY Right    MASS EXCISION Right 08/06/2016   Procedure: EXCISION CYST;  Surgeon: Daryll Brod, MD;  Location: [place];  Service: Orthopedics;  Laterality: Right;   REVISION TOTAL KNEE ARTHROPLASTY Left    TOTAL KNEE ARTHROPLASTY Bilateral    Current Facility-Administered Medications  Medication Dose Route Frequency Provider Last Rate Last Admin   acetaminophen (TYLENOL) tablet 650 mg  650 mg Oral Q6H PRN Howerter, Justin B,  DO   650 mg at 06/14/22 2150   Or   acetaminophen (TYLENOL) suppository 650 mg  650 mg Rectal Q6H PRN Howerter, Justin B, DO       carvedilol (COREG) tablet 12.5 mg  12.5 mg Oral BID WC Ghimire, Shanker M, MD   12.5 mg at 06/15/22 0840   cyanocobalamin (VITAMIN B12) tablet 1,000 mcg  1,000 mcg Oral Daily Thurnell Lose, MD   1,000 mcg at 06/15/22 0841   diclofenac Sodium (VOLTAREN) 1 % topical gel 2 g  2 g Topical QID Thurnell Lose, MD   2 g at 06/15/22 0841   febuxostat (ULORIC) tablet 40 mg  40 mg Oral Q M,W,F Ghimire, Henreitta Leber, MD   40 mg at 06/15/22 0840   furosemide (LASIX) injection 60 mg  60 mg Intravenous Once Thurnell Lose, MD       HYDROcodone-acetaminophen (NORCO/VICODIN) 5-325 MG per tablet 1 tablet  1 tablet Oral Q4H PRN Quintella Baton, MD   1 tablet at 06/15/22 0856   insulin aspart (novoLOG) injection 0-15 Units  0-15 Units Subcutaneous TID WC Thurnell Lose, MD       levothyroxine (SYNTHROID) tablet 75 mcg  75 mcg Oral QHS Jonetta Osgood, MD   75 mcg at 06/15/22 0500   montelukast (SINGULAIR) tablet 10 mg  10 mg Oral QHS Jonetta Osgood, MD   10 mg at 06/14/22 2150   multivitamin with minerals tablet 1 tablet  1 tablet Oral Daily Thurnell Lose, MD   1 tablet at 06/15/22 0840   naloxone [facility]) injection 0.4 mg  0.4 mg Intravenous PRN Howerter, Justin B, DO       ondansetron (ZOFRAN) injection 4 mg  4 mg Intravenous Q6H PRN Howerter, Justin B, DO   4 mg at 06/15/22 1157   pantoprazole (PROTONIX) EC tablet 40 mg  40 mg Oral BID AC Thurnell Lose, MD   40 mg at 06/15/22 0841   rOPINIRole (REQUIP) tablet 0.5 mg  0.5 mg Oral QHS Jonetta Osgood, MD   0.5 mg at 06/14/22 2150   rosuvastatin (CRESTOR) tablet 5 mg  5 mg Oral Daily Jonetta Osgood, MD   5 mg at 06/15/22 0840   sodium chloride (OCEAN) 0.65 % nasal spray 1 spray  1 spray Each Nare PRN Thurnell Lose, MD   1 spray at 06/11/22 0934   sodium zirconium cyclosilicate (LOKELMA) packet 10 g  10  g Oral BID Thurnell Lose, MD   10 g at 06/15/22 1203   tamsulosin (FLOMAX) capsule 0.4 mg  0.4 mg Oral Daily Thurnell Lose, MD   0.4 mg at 06/15/22 0840   Allergies as of 06/05/2022 - Review Complete 06/05/2022  Allergen Reaction Noted   Norvasc [amlodipine besylate] Shortness Of Breath 11/22/2007   Other Rash and Other (See Comments) 08/16/2020   Amlodipine besylate  06/02/2021   Avapro [irbesartan] Other (See Comments) 09/22/2016   Glucophage [metformin hydrochloride] Other (See Comments) 09/15/2011   Lisinopril  09/15/2011   Losartan Other (See Comments) 09/15/2011   Lumigan [bimatoprost] Other (See Comments) 05/22/2022   Morphine and related Other (See Comments) 09/15/2011   Simvastatin Other (See Comments) 41/96/2229   Trulicity [dulaglutide] Other (See Comments) 03/22/2015   Review of Systems:  Review of Systems  Constitutional:  Negative for weight loss.  Respiratory:  Positive for sputum production.   Cardiovascular:  Negative for chest pain.  Gastrointestinal:  Positive for abdominal pain, constipation, diarrhea, melena and nausea.    OBJECTIVE:   Temp:  [97.4 F (36.3 C)-98.1 F (36.7 C)] 97.4 F (36.3 C) (12/25 0411) Pulse Rate:  [87-93] 93 (12/25 0411) Resp:  [18] 18 (12/25 0411) BP: (113-130)/(50-76) 113/76 (12/25 0411) SpO2:  [90 %-100 %] 100 % (12/25 0411) Weight:  [95.3 kg] 95.3 kg (12/25 0500) Last BM Date : 06/12/22 Physical Exam Constitutional:      General: She is not in acute distress.    Appearance: She is not ill-appearing, toxic-appearing or diaphoretic.  Cardiovascular:     Rate and Rhythm: Normal rate. Rhythm irregular.  Pulmonary:     Effort: Respiratory distress (Mild, accessory muscle use) present.     Breath sounds: Normal breath sounds.     Comments: 4 L nasal cannula supplemental oxygen in place at time of exam Abdominal:     General: Bowel sounds are normal. There is no distension.     Palpations: Abdomen is soft.      Tenderness: There is abdominal tenderness (Diffuse). There is no guarding.  Musculoskeletal:     Right lower leg: Edema present.     Left lower leg: Edema present.  Skin:    General: Skin  is warm and dry.  Neurological:     Mental Status: She is alert.     Labs: Recent Labs    06/13/22 0509 06/14/22 0647 06/15/22 0646  WBC 11.6* 14.6* 7.0  HGB 8.5* 7.5* 6.5*  HCT 25.1* 22.7* 19.0*  PLT 261 228 182   BMET Recent Labs    06/13/22 0509 06/14/22 0647 06/15/22 0646  NA 132* 133* 133*  K 4.7 5.1 5.3*  CL 93* 93* 96*  CO2 _0 GLUCOSE 189* 171* 422*  BUN 152* 152* 157*  CREATININE 4.37* 4.32* 3.99*  CALCIUM 8.4* 8.7* 8.5*   LFT No results for input(s): "PROT", "ALBUMIN", "AST", "ALT", "ALKPHOS", "BILITOT", "BILIDIR", "IBILI" in the last 72 hours. PT/INR No results for input(s): "LABPROT", "INR" in the last 72 hours. Diagnostic imaging: VAS [place] UPPER EXTREMITY VENOUS DUPLEX  Result Date: 06/14/2022 UPPER VENOUS STUDY  Patient Name:  LAKELYN STRAUS Guia  Date of Exam:   06/14/2022 Medical Rec #: [id number]      Accession #:    [id number] Date of Birth: [birth year]      Patient Gender: F Patient Age:   37 years Exam Location:  [facility] Procedure:      VAS [place] UPPER EXTREMITY VENOUS DUPLEX Referring Phys: Deno Etienne [facility] --------------------------------------------------------------------------------  Indications: Swelling Risk Factors: None identified. Limitations: Poor ultrasound/tissue interface and orthopaedic appliance. Comparison Study: No prior studies. Performing Technologist: Oliver Hum RVT  Examination Guidelines: A complete evaluation includes B-mode imaging, spectral Doppler, color Doppler, and power Doppler as needed of all accessible portions of each vessel. Bilateral testing is considered an integral part of a complete examination. Limited examinations for reoccurring indications may be performed as noted.  Right Findings:  +----------+------------+---------+-----------+----------+-------+ RIGHT     CompressiblePhasicitySpontaneousPropertiesSummary +----------+------------+---------+-----------+----------+-------+ IJV           Full       Yes       Yes                      +----------+------------+---------+-----------+----------+-------+ Subclavian    Full       Yes       Yes                      +----------+------------+---------+-----------+----------+-------+ Axillary      Full       Yes       Yes                      +----------+------------+---------+-----------+----------+-------+ Brachial      Full       Yes       Yes                      +----------+------------+---------+-----------+----------+-------+ Radial        Full                                          +----------+------------+---------+-----------+----------+-------+ Ulnar         Full                                          +----------+------------+---------+-----------+----------+-------+ Cephalic      Full                                          +----------+------------+---------+-----------+----------+-------+  Basilic       Full                                          +----------+------------+---------+-----------+----------+-------+  Left Findings: +----------+------------+---------+-----------+----------+-------+ LEFT      CompressiblePhasicitySpontaneousPropertiesSummary +----------+------------+---------+-----------+----------+-------+ IJV           Full       Yes       Yes                      +----------+------------+---------+-----------+----------+-------+ Subclavian    Full       Yes       Yes                      +----------+------------+---------+-----------+----------+-------+ Axillary      Full       Yes       Yes                      +----------+------------+---------+-----------+----------+-------+ Brachial      Full       Yes       Yes                       +----------+------------+---------+-----------+----------+-------+ Radial        Full                                          +----------+------------+---------+-----------+----------+-------+ Ulnar         Full                                          +----------+------------+---------+-----------+----------+-------+ Cephalic      Full                                          +----------+------------+---------+-----------+----------+-------+ Basilic       Full                                          +----------+------------+---------+-----------+----------+-------+  Summary:  Right: No evidence of deep vein thrombosis in the upper extremity. No evidence of superficial vein thrombosis in the upper extremity.  Left: No evidence of deep vein thrombosis in the upper extremity. No evidence of superficial vein thrombosis in the upper extremity.  *See table(s) above for measurements and observations.  Diagnosing physician: Deitra Mayo MD Electronically signed by Deitra Mayo MD on 06/14/2022 at 6:23:42 PM.    Final    DG Wrist 2 Views Left  Result Date: 06/14/2022 CLINICAL DATA:  Wrist pain EXAM: LEFT WRIST - 2 VIEW COMPARISON:  None Available. FINDINGS: There is radiocarpal and TFCC chondrocalcinosis. There is severe base of thumb osteoarthritis. There is erosive arthritis at the small finger PIP joint, see separately dictated hand radiograph. There is no evidence of acute fracture. There is mild radiocarpal degenerative change. There is generalized soft tissue swelling. Heterotopic ossification in the anteromedial soft tissues of the forearm.  IMPRESSION: Severe base of thumb osteoarthritis. Mild radiocarpal degenerative change with chondrocalcinosis, a finding most commonly seen in osteoarthritis or CPPD arthropathy. No evidence of acute fracture.  Generalized soft tissue swelling. Electronically Signed   By: Maurine Simmering M.D.   On: 06/14/2022 12:06   DG Hand  Complete Left  Result Date: 06/14/2022 CLINICAL DATA:  Left hand pain EXAM: LEFT HAND - COMPLETE 3+ VIEW COMPARISON:  Left hand radiograph 11/13/2021 FINDINGS: There is severe first carpometacarpal joint osteoarthritis with bulky osteophyte formation, radial subluxation, and bony remodeling of the trapezium, similar to prior. Moderate triscaphe degenerative change. There is severe joint space narrowing and central erosive change at the index finger distal interphalangeal joint and small finger proximal phalangeal joint. There is moderate osteoarthritis of the other interphalangeal joints, prominent at the middle, ring, and small finger distal interphalangeal joints. There are angulation deformities of the index, middle, and small fingers. There is generalized soft tissue swelling of the hand. No periostitis. 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 Degree of arthritis is overall similar to prior exam in May 2023. There is no evidence of acute fracture. 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 Moderate osteoarthritis of the other interphalangeal joints. Degree of arthritis is overall similar to prior exam in May 2023. 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 Diffuse soft tissue swelling of the hand. No evidence of acute fracture. Electronically Signed   By: Maurine Simmering M.D.   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 on EGD February 2022 Dysphagia to solids/liquids/pills  -Esophageal stricture February 2022 status post balloon dilation 20 mm, did not  greatly improve her symptoms  PLAN: -Recommend EGD to further evaluate melena and anemia, though currently appears to be high risk endoscopy candidate given increased supplemental oxygen requirements (4 L supplemental oxygen in place at time of my exam today versus baseline 2 L) and is pending blood product transfusion (she currently has lower extremity edema and in setting of CHF, transfused volume may increase work of breathing) -Continue IV PPI every 12 hours -Trend H/H, transfuse for hemoglobin less than 7 -Discussed potential for EGD 06/16/2022, though will want to revisit her respiratory status in a.m. before electing to proceed with procedure -Okay for diet today, n.p.o. at midnight for possible EGD 06/16/2022 -Dr. Watt Climes with [facility] will follow patient 06/16/22   LOS: 10 days   Danton Clap, [facility]

## 2022-06-15 NOTE — Progress Notes (Signed)
Mobility Specialist Progress Note:   06/15/22 1006  Mobility  Activity Stood at bedside  Level of Assistance Standby assist, set-up cues, supervision of patient - no hands on  Assistive Device None  Activity Response Tolerated well  Mobility Referral Yes  $Mobility charge 1 Mobility   Pt received in bed and agreeable to stand at EOB. Pt expressed desire to take it slow today, deferring ambulation. Pt left in bed with all needs met and call bell in reach.   Andrey Campanile Mobility Specialist Please contact via SecureChat or  Rehab office at [phone]

## 2022-06-15 NOTE — Progress Notes (Signed)
Patient examined at bedside this morning.  Patient with significant decrease in pain complaints and increasing range of motion and decrease swelling.  Patient cannot make composite fist and dorsal erythema is almost completely resolved.  Uric acid level is elevated on most recent draw.  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  No further intervention necessary from hand surgery.

## 2022-06-15 NOTE — Progress Notes (Signed)
PROGRESS NOTE        PATIENT DETAILS Name: Tricia Gonzales Age: 81 y.o. Sex: female Date of Birth: 11-Feb-1941 Admit Date: 06/05/2022 Admitting Physician Rhetta Mura, DO GEX:BMWUX, Hal Hope, MD  Brief Summary: Patient is a 81 y.o.  female with history of chronic HFrEF, PAF on Eliquis, sick sinus syndrome-s/p PPM placement, chronic hypoxic respiratory failure on home O2, DM-2, CKD 4 who presented with subjective fever-was found to have PNA/COVID-19 infection, melena/GI bleed with acute blood loss anemia and AKI on CKD 4.  Significant events: 11/30-12/07>> hospitalization- acute on chronic HFrEF 12/15>> hospitalization for COVID infection/PNA/possible GI bleed/AKI on CKD 4.  Significant studies: 11/20>> echo: EF 32-44%, RV systolic function moderately reduced. 12/15>> CXR: Retrocardiac consolidation/atelectasis.  Significant microbiology data: 12/15>> COVID PCR: Positive (CT value 26.1) 12/15>> influenza A/B/RSV: Negative  Procedures: None  Consults: GI Cardiology Hand   Subjective:  Patient in bed, appears comfortable, denies any headache, no fever, no chest pain or pressure, no shortness of breath , no abdominal pain. No new focal weakness.  Objective: Vitals: Blood pressure 113/76, pulse 93, temperature (!) 97.4 F (36.3 C), temperature source Oral, resp. rate 18, height _0  (1.473 m), weight 95.3 kg, SpO2 100 %.   Exam:  Awake Alert, No new F.N deficits, Normal affect [place].AT,PERRAL Supple Neck, No JVD,   Symmetrical Chest wall movement, Good air movement bilaterally, CTAB RRR,No Gallops, Rubs or new Murmurs,  +ve B.Sounds, Abd Soft, No tenderness,   L.Hand and wrist swollen and tender.   Assessment/Plan:  Upper GI bleed with acute blood loss anemia  - presented with melanotic stools in the presence of Eliquis treatment, received 2 units of packed RBC earlier this admission and getting another 2 units on 06/15/2022, she has been  placed on PPI was seen by [facility], endoscopy was not done as she was incidentally COVID-positive.  Unfortunately H&H is dropping again on 06/15/2022 and now off of COVID isolation, have reconsulted GI and holding Eliquis.  Borderline low B12.  Placed on replacement.    AKI on CKD 4   - AKI likely hemodynamically mediated due to GI bleed, has been adequately transfused, renal ultrasound nonacute, bladder scan stable, on Flomax that she had some intermittent urinary retention earlier this admission.  Nephrology on case.  Clearly fluid overloaded Lasix as tolerated.  Appreciate nephrology input.  Acute left wrist pain starting night of 06/13/2022 according to the patient after she moved out of bed.  Suspicious for gouty arthritis significantly improved after single dose IV Lasix on 06/14/2022, give her 3 doses of colchicine as well, topical [place] cream, appreciate hand surgery input, x-rays nonacute.    PNA bacterial along with COVID-19.  Clinically resolved treatment finished.   Acute on chronic systolic and diastolic heart failure EF 35%.  Being diuresed, was seen by cardiology this admission, nephrology also on board managing diuretics.  Cannot tolerate ACE/ARB due to underlying renal insufficiency.  Beta-blocker as tolerated.    PAF [place] vas 2 score of greater than 4.  On Coreg.  Eliquis hold again on 06/15/2022 as H&H dropping.    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   Weight as of this encounter: 95.3 kg.    Code status:   Code Status: DNR reconfirmed with daughter/patient this morning.  DVT Prophylaxis: apixaban (ELIQUIS) tablet 2.5 mg Start: 06/17/22 1000 Place TED hose Start: 06/11/22 0547 SCDs Start: 06/05/22 2151 apixaban (ELIQUIS) tablet 2.5 mg    Family Communication:   Daughter-[name]-[phone] updated over the phone on 06/10/22, 06/11/2022, 06/12/22   Disposition Plan: Status is: Inpatient Remains inpatient appropriate because: Upper GI bleed-acute blood loss anemia-PNA-COVID infection-AKI-not yet stable for discharge.   Planned Discharge Destination:Home health   Diet: Diet Order             DIET SOFT Room service appropriate? Yes with Assist; Fluid consistency: Thin; Fluid restriction: 1200 mL Fluid  Diet effective now                    MEDICATIONS: Scheduled Meds:  sodium chloride   Intravenous Once   [START ON 06/17/2022] apixaban  2.5 mg Oral BID   carvedilol  12.5 mg Oral BID WC   colchicine  0.3 mg Oral Daily   vitamin B-12  1,000 mcg Oral Daily   diclofenac Sodium  2 g Topical QID   febuxostat  40 mg Oral Q M,W,F   furosemide  60 mg Intravenous Once   insulin aspart  0-15 Units Subcutaneous Q4H   levothyroxine  75 mcg Oral QHS   montelukast  10 mg Oral QHS   multivitamin with minerals  1 tablet Oral Daily   pantoprazole  40 mg Oral BID AC   rOPINIRole  0.5 mg Oral QHS   rosuvastatin  5 mg Oral Daily   sodium zirconium cyclosilicate  10 g Oral BID   tamsulosin  0.4 mg Oral Daily   Continuous Infusions:   PRN Meds:.acetaminophen **OR** acetaminophen, HYDROcodone-acetaminophen, naLOXone (NARCAN)  injection, ondansetron (ZOFRAN) IV, sodium chloride   I have personally reviewed following labs and imaging studies  LABORATORY DATA:  Recent Labs  Lab 06/11/22 0400 06/12/22 0402 06/13/22 0509 06/14/22 0647 06/15/22 0646  WBC 8.6  8.5 11.6* 14.6* 7.0  HGB 8.2* 7.3* 8.5* 7.5* 6.5*  HCT 23.7* 21.3* 25.1* 22.7* 19.0*  PLT 239 228 261 228 182  MCV 99.6 103.4* 103.3* 101.8* 103.8*  MCH 34.5* 35.4* 35.0* 33.6 35.5*  MCHC 34.6 34.3 33.9 33.0 34.2  RDW 19.0* Not Measured 20.0* 19.1* 19.9*  LYMPHSABS 0.5* 0.4* 0.4* 0.3* 0.1*  MONOABS 0.6 0.7 0.8 1.1* 0.2  EOSABS 0.2 0.1 0.1 0.0 0.0  BASOSABS 0.0 0.0 0.0 0.0 0.0    Recent Labs  Lab 06/09/22 0507 06/10/22 0325 06/11/22 0400 06/12/22 0402 06/13/22 0509 06/14/22 0647 06/15/22 0646  NA 135 134* 131* 132* 132* 133* 133*  K 3.9 4.0 4.6 4.6 4.7 5.1 5.3*  CL 96* 94* 94* 95* 93* 93* 96*  CO2 _1 ANIONGAP _2 GLUCOSE 114* 184* 208* 164* 189* 171* 422*  BUN 152* 153* 150* 151* 152* 152* 157*  CREATININE 3.72* 3.87* 4.23* 4.65* 4.37* 4.32* 3.99*  CRP 7.3* 5.9* 4.3*  --   --   --   --   BNP 2,453.2* 2,019.1* 2,120.6*  --   --   --   --  MG 2.1 2.2 2.1  --   --   --   --   CALCIUM 8.2* 8.2* 8.2* 8.3* 8.4* 8.7* 8.5*    RADIOLOGY STUDIES/RESULTS: VAS [place] UPPER EXTREMITY VENOUS DUPLEX  Result Date: 06/14/2022 UPPER VENOUS STUDY  Patient Name:  CHERYLLYNN SARFF Karge  Date of Exam:   06/14/2022 Medical Rec #: [id number]      Accession #:    [id number] Date of Birth: March 25, 1941      Patient Gender: F Patient Age:   55 years Exam [facility]  [facility] Procedure:      VAS [place] UPPER EXTREMITY VENOUS DUPLEX Referring Phys: Deno Etienne [facility] --------------------------------------------------------------------------------  Indications: Swelling Risk Factors: None identified. Limitations: Poor ultrasound/tissue interface and orthopaedic appliance. Comparison Study: No prior studies. Performing Technologist: Oliver Hum RVT  Examination Guidelines: A complete evaluation includes B-mode imaging, spectral Doppler, color Doppler, and power Doppler as needed of all accessible portions of each vessel. Bilateral testing is considered an integral part of a  complete examination. Limited examinations for reoccurring indications may be performed as noted.  Right Findings: +----------+------------+---------+-----------+----------+-------+ RIGHT     CompressiblePhasicitySpontaneousPropertiesSummary +----------+------------+---------+-----------+----------+-------+ IJV           Full       Yes       Yes                      +----------+------------+---------+-----------+----------+-------+ Subclavian    Full       Yes       Yes                      +----------+------------+---------+-----------+----------+-------+ Axillary      Full       Yes       Yes                      +----------+------------+---------+-----------+----------+-------+ Brachial      Full       Yes       Yes                      +----------+------------+---------+-----------+----------+-------+ Radial        Full                                          +----------+------------+---------+-----------+----------+-------+ Ulnar         Full                                          +----------+------------+---------+-----------+----------+-------+ Cephalic      Full                                          +----------+------------+---------+-----------+----------+-------+ Basilic       Full                                          +----------+------------+---------+-----------+----------+-------+  Left Findings: +----------+------------+---------+-----------+----------+-------+ LEFT      CompressiblePhasicitySpontaneousPropertiesSummary +----------+------------+---------+-----------+----------+-------+ IJV           Full  Yes       Yes                      +----------+------------+---------+-----------+----------+-------+ Subclavian    Full       Yes       Yes                      +----------+------------+---------+-----------+----------+-------+ Axillary      Full       Yes       Yes                       +----------+------------+---------+-----------+----------+-------+ Brachial      Full       Yes       Yes                      +----------+------------+---------+-----------+----------+-------+ Radial        Full                                          +----------+------------+---------+-----------+----------+-------+ Ulnar         Full                                          +----------+------------+---------+-----------+----------+-------+ Cephalic      Full                                          +----------+------------+---------+-----------+----------+-------+ Basilic       Full                                          +----------+------------+---------+-----------+----------+-------+  Summary:  Right: No evidence of deep vein thrombosis in the upper extremity. No evidence of superficial vein thrombosis in the upper extremity.  Left: No evidence of deep vein thrombosis in the upper extremity. No evidence of superficial vein thrombosis in the upper extremity.  *See table(s) above for measurements and observations.  Diagnosing physician: Deitra Mayo MD Electronically signed by Deitra Mayo MD on 06/14/2022 at 6:23:42 PM.    Final    DG Wrist 2 Views Left  Result Date: 06/14/2022 CLINICAL DATA:  Wrist pain EXAM: LEFT WRIST - 2 VIEW COMPARISON:  None Available. FINDINGS: There is radiocarpal and TFCC chondrocalcinosis. There is severe base of thumb osteoarthritis. There is erosive arthritis at the small finger PIP joint, see separately dictated hand radiograph. There is no evidence of acute fracture. There is mild radiocarpal degenerative change. There is generalized soft tissue swelling. Heterotopic ossification in the anteromedial soft tissues of the forearm. IMPRESSION: Severe base of thumb osteoarthritis. Mild radiocarpal degenerative change with chondrocalcinosis, a finding most commonly seen in osteoarthritis or CPPD arthropathy. No evidence of acute  fracture.  Generalized soft tissue swelling. Electronically Signed   By: Maurine Simmering M.D.   On: 06/14/2022 12:06   DG Hand Complete Left  Result Date: 06/14/2022 CLINICAL DATA:  Left hand pain EXAM: LEFT HAND - COMPLETE 3+ VIEW COMPARISON:  Left hand radiograph 11/13/2021 FINDINGS: There is severe  first carpometacarpal joint osteoarthritis with bulky osteophyte formation, radial subluxation, and bony remodeling of the trapezium, similar to prior. Moderate triscaphe degenerative change. There is severe joint space narrowing and central erosive change at the index finger distal interphalangeal joint and small finger proximal phalangeal joint. There is moderate osteoarthritis of the other interphalangeal joints, prominent at the middle, ring, and small finger distal interphalangeal joints. There are angulation deformities of the index, middle, and small fingers. There is generalized soft tissue swelling of the hand. No periostitis. 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 Degree of arthritis is overall similar to prior exam in May 2023. There is no evidence of acute fracture. 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 Moderate osteoarthritis of the other interphalangeal joints. Degree of arthritis is overall similar to prior exam in May 2023. 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 Diffuse soft tissue swelling of the hand. No evidence of acute fracture. Electronically Signed   By: Maurine Simmering M.D.   On: 06/14/2022 08:40     LOS: 10 days   Signature  -    Lala Lund M.D on 06/15/2022 at 9:00 AM   -  To page go to [email or web address]

## 2022-06-15 NOTE — Progress Notes (Signed)
Patient ID: Tricia Gonzales, female   DOB: 11-18-1940, 81 y.o.   MRN: [id number] S: No new complaints.  Feels better.  Happy to hear that her Scr improved overnight. O:BP 113/76 (BP Location: Right Arm)   Pulse 93   Temp 98.1 F (36.7 C)   Resp 18   Ht _0  (1.473 m)   Wt 95.3 kg   SpO2 100%   BMI 43.91 kg/m  Exam Gen:NAD CVS: RRR Resp:CTA Abd: +BS, soft, NT/ND Ext: diffuse 1+ LE edema bilat    Date   Creat  eGFR   2017   1.22- 2.34   2018   1.60   2019   1.43- 1.87   2020   1.69- 2.02 Aug 2020  1.79- 2.43   Mar-may 2022 1.95- 2.74 17- 26 ml/min   Nov 2022  2.40  20   Feb - oct 2023 1.80- 2.16 25- 28 ml/min    11/29- 06/02/22  2.41- 3.35 13- 19 ml/min   12/12-  06/06/22 3.32- 4.00 10- 13 ml/min     12/15  4.10     06/15/22  3.99    UA 12/15 - 6-10 rbc, 0-5 wbc, 30 protein   UNa 31, UCr 65     Renal US - 9.6 / 10.1 cm. IMPRESSION: Bilateral renal cortical thinning is noted. Bilateral renal cysts are noted. No hydronephrosis or renal obstruction is noted     CXR 12/1 - IMPRESSION: Retrocardiac atelectasis/consolidation. Pneumonia is difficult to exclude. Cardiomegaly and pulmonary vascular congestion.  Assessment/Plan:  AKI on CKD 4 - b/l creatinine 1.8- 2.1 from early-mid 2023, eGFR 25/-28 ml/min. Initially was thought to have some volume depletion with watery stools and possible ischemic ATN in setting of ABLA and soft BP's.  Now with possible cardiorenal syndrome.  Last dose of IV lasix was 06/09/22.  She also had some urinary retention and was started on Flomax. Renal US w/ negative for obstruction. Pt believes that furosemide is damaging her kidneys and has been reluctant to take it. She confirms that she does not dialysis and understands the consequences if her kidneys should fail completely. Creat peaked at 4.6 on 12/22 and is down to 3.9 today.  Continue to follow bladder scans and I&O cath as needed.  Cont supportive care.  PNA - likely bacterial and started on  azithromycin per primary CHRF - on home O2 at 2 L [place] Covid-19 infection - s/p remdesivir x 3 days, stable on 2 L O2 via [place] per primary Chronic CHF - with severe MR and TR, reduced EF 30-35% and moderately reduced RV function. Seen by cardiology, plan for outpatient upgrade of PPM to BiV device.  Is getting IV lasix 36m daily as needed now.  Acute on chronic anemia with heme + stools - GI following, but holding off on EGD until her respiratory status improves. Resumed Eliquis per GI. UGIB - as above, transfuse prn. 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 HTN - BP stable DM type 2 - per primary.  Disposition - awaiting SNF placement given debilitation.  RKelly Splinter MD 06/15/2022, 2:50 PM  Recent Labs  Lab 06/14/22 0647 06/15/22 0646  HGB 7.5* 6.5*  CALCIUM 8.7* 8.5*  CREATININE 4.32* 3.99*  K 5.1 5.3*    Inpatient medications:  carvedilol  12.5 mg Oral BID WC   vitamin B-12  1,000 mcg Oral Daily   diclofenac  Sodium  2 g Topical QID   febuxostat  40 mg Oral Q M,W,F   insulin aspart  0-15 Units Subcutaneous TID WC   levothyroxine  75 mcg Oral QHS   montelukast  10 mg Oral QHS   multivitamin with minerals  1 tablet Oral Daily   pantoprazole  40 mg Oral BID AC   rOPINIRole  0.5 mg Oral QHS   rosuvastatin  5 mg Oral Daily   sodium zirconium cyclosilicate  10 g Oral BID   tamsulosin  0.4 mg Oral Daily    acetaminophen **OR** acetaminophen, HYDROcodone-acetaminophen, naLOXone (NARCAN)  injection, ondansetron (ZOFRAN) IV, sodium chloride

## 2022-06-15 NOTE — Plan of Care (Signed)
  Problem: Education: Goal: Ability to describe self-care measures that may prevent or decrease complications (Diabetes Survival Skills Education) will improve Outcome: Progressing Goal: Individualized Educational Video(s) Outcome: Progressing   Problem: Coping: Goal: Ability to adjust to condition or change in health will improve Outcome: Progressing   Problem: Health Behavior/Discharge Planning: Goal: Ability to identify and utilize available resources and services will improve Outcome: Progressing Goal: Ability to manage health-related needs will improve Outcome: Progressing   Problem: Nutritional: Goal: Maintenance of adequate nutrition will improve Outcome: Progressing Goal: Progress toward achieving an optimal weight will improve Outcome: Progressing   Problem: Skin Integrity: Goal: Risk for impaired skin integrity will decrease Outcome: Progressing

## 2022-06-16 ENCOUNTER — Encounter (HOSPITAL_COMMUNITY): Payer: Self-pay | Admitting: Internal Medicine

## 2022-06-16 ENCOUNTER — Encounter (HOSPITAL_COMMUNITY)
Admission: EM | Disposition: A | Discharge status: Skilled Nursing Facility | Payer: Self-pay | Source: Home / Self Care | Attending: Internal Medicine

## 2022-06-16 ENCOUNTER — Inpatient Hospital Stay (HOSPITAL_COMMUNITY): Payer: Medicare Other | Admitting: Anesthesiology

## 2022-06-16 DIAGNOSIS — K449 Diaphragmatic hernia without obstruction or gangrene: Secondary | ICD-10-CM

## 2022-06-16 DIAGNOSIS — K922 Gastrointestinal hemorrhage, unspecified: Secondary | ICD-10-CM | POA: Diagnosis not present

## 2022-06-16 DIAGNOSIS — K222 Esophageal obstruction: Secondary | ICD-10-CM

## 2022-06-16 DIAGNOSIS — K224 Dyskinesia of esophagus: Secondary | ICD-10-CM

## 2022-06-16 DIAGNOSIS — I130 Hypertensive heart and chronic kidney disease with heart failure and stage 1 through stage 4 chronic kidney disease, or unspecified chronic kidney disease: Secondary | ICD-10-CM

## 2022-06-16 DIAGNOSIS — I509 Heart failure, unspecified: Secondary | ICD-10-CM

## 2022-06-16 DIAGNOSIS — N189 Chronic kidney disease, unspecified: Secondary | ICD-10-CM

## 2022-06-16 DIAGNOSIS — D500 Iron deficiency anemia secondary to blood loss (chronic): Secondary | ICD-10-CM

## 2022-06-16 HISTORY — PX: ESOPHAGOGASTRODUODENOSCOPY (EGD) WITH PROPOFOL: SHX5813

## 2022-06-16 HISTORY — PX: HOT HEMOSTASIS: SHX5433

## 2022-06-16 LAB — TYPE AND SCREEN
ABO/RH(D): A POS
Antibody Screen: NEGATIVE
Unit division: 0
Unit division: 0

## 2022-06-16 LAB — BPAM RBC
Blood Product Expiration Date: 202401152359
Blood Product Expiration Date: 202401172359
ISSUE DATE / TIME: 202312251211
ISSUE DATE / TIME: 202312251520
Unit Type and Rh: 5100
Unit Type and Rh: 5100

## 2022-06-16 LAB — CBC WITH DIFFERENTIAL/PLATELET
Abs Immature Granulocytes: 0.06 10*3/uL (ref 0.00–0.07)
Basophils Absolute: 0 10*3/uL (ref 0.0–0.1)
Basophils Relative: 0 %
Eosinophils Absolute: 0 10*3/uL (ref 0.0–0.5)
Eosinophils Relative: 0 %
HCT: 26 % — ABNORMAL LOW (ref 36.0–46.0)
Hemoglobin: 8.8 g/dL — ABNORMAL LOW (ref 12.0–15.0)
Immature Granulocytes: 1 %
Lymphocytes Relative: 3 %
Lymphs Abs: 0.3 10*3/uL — ABNORMAL LOW (ref 0.7–4.0)
MCH: 32.4 pg (ref 26.0–34.0)
MCHC: 33.8 g/dL (ref 30.0–36.0)
MCV: 95.6 fL (ref 80.0–100.0)
Monocytes Absolute: 0.5 10*3/uL (ref 0.1–1.0)
Monocytes Relative: 5 %
Neutro Abs: 9 10*3/uL — ABNORMAL HIGH (ref 1.7–7.7)
Neutrophils Relative %: 91 %
Platelets: 196 10*3/uL (ref 150–400)
RBC: 2.72 MIL/uL — ABNORMAL LOW (ref 3.87–5.11)
RDW: 17.1 % — ABNORMAL HIGH (ref 11.5–15.5)
WBC: 9.9 10*3/uL (ref 4.0–10.5)
nRBC: 0 % (ref 0.0–0.2)

## 2022-06-16 LAB — BASIC METABOLIC PANEL
Anion gap: 11 (ref 5–15)
BUN: 153 mg/dL — ABNORMAL HIGH (ref 8–23)
CO2: 29 mmol/L (ref 22–32)
Calcium: 8.9 mg/dL (ref 8.9–10.3)
Chloride: 95 mmol/L — ABNORMAL LOW (ref 98–111)
Creatinine, Ser: 3.55 mg/dL — ABNORMAL HIGH (ref 0.44–1.00)
GFR, Estimated: 12 mL/min — ABNORMAL LOW (ref >60)
Glucose, Bld: 249 mg/dL — ABNORMAL HIGH (ref 70–99)
Potassium: 5 mmol/L (ref 3.5–5.1)
Sodium: 135 mmol/L (ref 135–145)

## 2022-06-16 LAB — GLUCOSE, CAPILLARY
Glucose-Capillary: 236 mg/dL — ABNORMAL HIGH (ref 70–99)
Glucose-Capillary: 243 mg/dL — ABNORMAL HIGH (ref 70–99)
Glucose-Capillary: 246 mg/dL — ABNORMAL HIGH (ref 70–99)
Glucose-Capillary: 248 mg/dL — ABNORMAL HIGH (ref 70–99)
Glucose-Capillary: 263 mg/dL — ABNORMAL HIGH (ref 70–99)
Glucose-Capillary: 320 mg/dL — ABNORMAL HIGH (ref 70–99)
Glucose-Capillary: 334 mg/dL — ABNORMAL HIGH (ref 70–99)

## 2022-06-16 SURGERY — ESOPHAGOGASTRODUODENOSCOPY (EGD) WITH PROPOFOL
Anesthesia: Monitor Anesthesia Care

## 2022-06-16 MED ORDER — FUROSEMIDE 10 MG/ML IJ SOLN
40.0000 mg | Freq: Once | INTRAMUSCULAR | Status: AC
  Administered 2022-06-16: 40 mg via INTRAVENOUS
  Filled 2022-06-16: qty 4

## 2022-06-16 MED ORDER — PROPOFOL 500 MG/50ML IV EMUL
INTRAVENOUS | Status: DC | PRN
  Administered 2022-06-16: 100 ug/kg/min via INTRAVENOUS

## 2022-06-16 MED ORDER — LIDOCAINE 2% (20 MG/ML) 5 ML SYRINGE
INTRAMUSCULAR | Status: DC | PRN
  Administered 2022-06-16: 100 mg via INTRAVENOUS

## 2022-06-16 MED ORDER — PHENYLEPHRINE 80 MCG/ML (10ML) SYRINGE FOR IV PUSH (FOR BLOOD PRESSURE SUPPORT)
PREFILLED_SYRINGE | INTRAVENOUS | Status: DC | PRN
  Administered 2022-06-16: 160 ug via INTRAVENOUS
  Administered 2022-06-16: 80 ug via INTRAVENOUS
  Administered 2022-06-16: 160 ug via INTRAVENOUS

## 2022-06-16 MED ORDER — SODIUM CHLORIDE 0.9 % IV SOLN: INTRAVENOUS | Status: DC

## 2022-06-16 MED ORDER — PROPOFOL 10 MG/ML IV BOLUS
INTRAVENOUS | Status: DC | PRN
  Administered 2022-06-16 (×2): 20 mg via INTRAVENOUS

## 2022-06-16 SURGICAL SUPPLY — 15 items

## 2022-06-16 NOTE — Op Note (Signed)
[facility] Patient Name: Tricia Gonzales Procedure Date : 06/16/2022 MRN: [id number] Attending MD: Clarene Essex , MD, [id number] Date of Birth: May 16, 1941 CSN: [id number] Age: 81 Admit Type: Inpatient Procedure:                Upper GI endoscopy Indications:              Iron deficiency anemia secondary to chronic blood                            loss, Heme positive stool Providers:                Clarene Essex, MD, Jamison Neighbor RN, RN, Gloris Ham, Technician Referring MD:              Medicines:                Monitored Anesthesia Care Complications:            No immediate complications. Estimated Blood Loss:     Estimated blood loss: none. Procedure:                Pre-Anesthesia Assessment:                           - Prior to the procedure, a History and Physical                            was performed, and patient medications and                            allergies were reviewed. The patient's tolerance of                            previous anesthesia was also reviewed. The risks                            and benefits of the procedure and the sedation                            options and risks were discussed with the patient.                            All questions were answered, and informed consent                            was obtained. Prior Anticoagulants: The patient has                            taken Eliquis (apixaban), last dose was 2 days                            prior to procedure. ASA Grade Assessment: III - A  patient with severe systemic disease. After                            reviewing the risks and benefits, the patient was                            deemed in satisfactory condition to undergo the                            procedure.                           After obtaining informed consent, the endoscope was                            passed under direct vision. Throughout the                             procedure, the patient's blood pressure, pulse, and                            oxygen saturations were monitored continuously. The                            [id number]-[id number] ([id number]) Olympus endoscope was introduced                            through the mouth, and advanced to the third part                            of duodenum. The upper GI endoscopy was                            accomplished without difficulty. The patient                            tolerated the procedure well. Scope In: Scope Out: Findings:      The larynx was normal. Biopsies for histology were taken with a cold       forceps for evaluation of celiac disease.      A small hiatal hernia was present.      One benign-appearing, intrinsic mild stenosis was found. The stenosis       was traversed.      Abnormal motility was noted in the middle third of the esophagus and in       the lower third of the esophagus. There is spasticity of the esophageal       body. The distal esophagus/lower esophageal sphincter is spastic, but       gives up passage to the endoscope.      A few localized small erosions versus AVMs with stigmata of recent       bleeding were found in the gastric antrum. Fulguration to ablate the       lesion to prevent bleeding by argon plasma at 1 liter/minute and 20       watts was successful.      The duodenal  bulb, first portion of the duodenum, second portion of the       duodenum and third portion of the duodenum were normal.      The exam was otherwise without abnormality. Impression:               - Normal larynx.                           - Small hiatal hernia.                           - Benign-appearing esophageal stenosis.                           - Abnormal esophageal motility, suspicious for                            presbyesophagus.                           - Erosive gastropathy with stigmata of recent                            bleeding versus AVMs. Treated with  argon plasma                            coagulation (APC).                           - Normal duodenal bulb, first portion of the                            duodenum, second portion of the duodenum and third                            portion of the duodenum.                           - The examination was otherwise normal. Recommendation:           - Resume previous diet today.                           - Continue present medications. Continue once a day                            pump inhibitors and reevaluate blood thinner needs                            going forward okay with me to restart in a few days                            if absolutely needed                           - Return to GI clinic PRN.                           -  Telephone GI clinic if symptomatic PRN. 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 All rights reserved. The codes documented in this report are preliminary and upon coder review may  be revised to meet current compliance requirements. Clarene Essex, MD 06/16/2022 1:20:53 PM This report has been signed  electronically. Number of Addenda: 0

## 2022-06-16 NOTE — Care Management Important Message (Signed)
Important Message  Patient Details  Name: Tricia Gonzales MRN: [id number] Date of Birth: 23-May-1941   Medicare Important Message Given:  Yes     Orbie Pyo 06/16/2022, 3:37 PM

## 2022-06-16 NOTE — Anesthesia Postprocedure Evaluation (Signed)
Anesthesia Post Note  Patient: ANAGHA LOSEKE  Procedure(s) Performed: ESOPHAGOGASTRODUODENOSCOPY (EGD) WITH PROPOFOL HOT HEMOSTASIS (ARGON PLASMA COAGULATION/BICAP)     Patient location during evaluation: PACU Anesthesia Type: MAC Level of consciousness: awake and alert Pain management: pain level controlled Vital Signs Assessment: post-procedure vital signs reviewed and stable Respiratory status: spontaneous breathing, nonlabored ventilation, respiratory function stable and patient connected to nasal cannula oxygen Cardiovascular status: stable and blood pressure returned to baseline Postop Assessment: no apparent nausea or vomiting Anesthetic complications: no   No notable events documented.  Last Vitals:  Vitals:   06/16/22 1305 06/16/22 1320  BP: (!) 118/57 (!) 146/72  Pulse: 88 86  Resp: 19 16  Temp: 36.5 C   SpO2: 99% 100%    Last Pain:  Vitals:   06/16/22 1320  TempSrc:   PainSc: 0-No pain                 Joeanna Howdyshell A.

## 2022-06-16 NOTE — Inpatient Diabetes Management (Signed)
Inpatient Diabetes Program Recommendations  AACE/ADA: New Consensus Statement on Inpatient Glycemic Control (2015)  Target Ranges:  Prepandial:   less than 140 mg/dL      Peak postprandial:   less than 180 mg/dL (1-2 hours)      Critically ill patients:  140 - 180 mg/dL   Lab Results  Component Value Date   GLUCAP 248 (H) 06/16/2022   HGBA1C 7.7 (H) 05/23/2022    Latest Reference Range & Units 06/15/22 08:30 06/15/22 12:38 06/15/22 16:03 06/15/22 20:36 06/16/22 00:11 06/16/22 03:37 06/16/22 08:01 06/16/22 12:20  Glucose-Capillary 70 - 99 mg/dL 351 (H) 234 (H) 218 (H) 280 (H) 236 (H) 263 (H) 243 (H) 248 (H)  (H): Data is abnormally high  Diabetes history: Type 2 DM Outpatient Diabetes medications: Tresiba 8-10 units QD Current orders for Inpatient glycemic control: Novolog 0-15 units tid  Inpatient Diabetes Program Recommendations:  Please consider: -Restart Semglee 8 units qd  Thank you, Nani Gasser. Tunis Gentle, RN, MSN, CDE  Diabetes Coordinator Inpatient Glycemic Control Team Team Pager [phone] (8am-5pm) 06/16/2022 1:59 PM

## 2022-06-16 NOTE — Transfer of Care (Signed)
Immediate Anesthesia Transfer of Care Note  Patient: Tricia Gonzales  Procedure(s) Performed: ESOPHAGOGASTRODUODENOSCOPY (EGD) WITH PROPOFOL  Patient Location: Short Stay  Anesthesia Type:MAC  Level of Consciousness: drowsy  Airway & Oxygen Therapy: Patient Spontanous Breathing and Patient connected to nasal cannula oxygen  Post-op Assessment: Report given to RN and Post -op Vital signs reviewed and stable  Post vital signs: Reviewed and stable  Last Vitals:  Vitals Value Taken Time  BP    Temp    Pulse    Resp    SpO2      Last Pain:  Vitals:   06/16/22 1157  TempSrc: Temporal  PainSc: 8       Patients Stated Pain Goal: 0 (73/56/70 1410)  Complications: No notable events documented.

## 2022-06-16 NOTE — Plan of Care (Signed)
  Problem: Education: Goal: Ability to describe self-care measures that may prevent or decrease complications (Diabetes Survival Skills Education) will improve Outcome: Progressing

## 2022-06-16 NOTE — Plan of Care (Signed)
  Problem: Nutritional: Goal: Progress toward achieving an optimal weight will improve Outcome: Progressing   Problem: Education: Goal: Knowledge of General Education information will improve Description: Including pain rating scale, medication(s)/side effects and non-pharmacologic comfort measures Outcome: Progressing   Problem: Activity: Goal: Risk for activity intolerance will decrease Outcome: Progressing   Problem: Elimination: Goal: Will not experience complications related to bowel motility Outcome: Progressing

## 2022-06-16 NOTE — Progress Notes (Signed)
Tricia Gonzales 12:29 PM  Subjective: Seen and examined and case discussed with my partner Dr. Randel Pigg and her hospital computer chart reviewed and she is currently not having any problems no further signs of bleeding  Objective: Signs stable afebrile no acute distress exam please see preassessment evaluation increased BUN and creatinine hemoglobin increased with transfusion.  Iron studies compatible with anemia of chronic disease  Assessment: Pulm medical problems including positive anemia history of AVMs  Plan: Get a proceed with endoscopy today with anesthesia assistance and that procedure was rediscussed with the patient  [facility]  office [phone] After 5PM or if no answer call [phone]

## 2022-06-16 NOTE — Anesthesia Procedure Notes (Signed)
Procedure Name: General with mask airway Date/Time: 06/16/2022 12:41 PM  Performed by: Erick Colace, CRNAPre-anesthesia Checklist: Patient identified, Emergency Drugs available, Suction available and Patient being monitored Patient Re-evaluated:Patient Re-evaluated prior to induction Oxygen Delivery Method: Nasal cannula Preoxygenation: Pre-oxygenation with 100% oxygen Induction Type: IV induction Airway Equipment and Method: Bite block Dental Injury: Teeth and Oropharynx as per pre-operative assessment

## 2022-06-16 NOTE — Progress Notes (Signed)
PROGRESS NOTE        PATIENT DETAILS Name: Tricia Gonzales Age: 81 y.o. Sex: female Date of Birth: [birth year] Admit Date: 06/05/2022 Admitting Physician Rhetta Mura, DO QHU:[id number], Hal Hope, MD  Brief Summary: Patient is a 81 y.o.  female with history of chronic HFrEF, PAF on Eliquis, sick sinus syndrome-s/p PPM placement, chronic hypoxic respiratory failure on home O2, DM-2, CKD 4 who presented with subjective fever-was found to have PNA/COVID-19 infection, melena/GI bleed with acute blood loss anemia and AKI on CKD 4.  She had incidental COVID-19 infection hence initially GI did not want to pursue any endoscopies, she was cleared to resume Eliquis by [facility], she subsequently developed AKI on CKD 4 seen by nephrology, left wrist gouty arthritis, upon resumption of Eliquis she developed gradual decline in H&H despite being diuresed by nephrology, she received 2 units of packed RBC on 06/15/2022, Eliquis was discontinued and GI was reconsulted, now plan is to hold Eliquis and pursue EGD on 06/16/2022.   Significant events: 11/30-12/07>> hospitalization- acute on chronic HFrEF 12/15>> hospitalization for COVID infection/PNA/possible GI bleed/AKI on CKD 4.  Significant studies: 11/20>> echo: EF 50-35%, RV systolic function moderately reduced. 12/15>> CXR: Retrocardiac consolidation/atelectasis.  Significant microbiology data: 12/15>> COVID PCR: Positive (CT value 26.1) 12/15>> influenza A/B/RSV: Negative  Procedures: None  Consults: GI Cardiology Hand surgery   Subjective: Patient in bed, appears comfortable, denies any headache, no fever, no chest pain or pressure, no shortness of breath , no abdominal pain. No focal weakness.  Objective: Vitals: Blood pressure (!) 127/59, pulse 86, temperature 97.9 F (36.6 C), temperature source Oral, resp. rate 17, height _0  (1.473 m), weight 96.7 kg, SpO2 96 %.   Exam:  Awake Alert, No new F.N  deficits, Normal affect [place].AT,PERRAL Supple Neck, No JVD,   Symmetrical Chest wall movement, Good air movement bilaterally, CTAB RRR,No Gallops, Rubs or new Murmurs,  +ve B.Sounds, Abd Soft, No tenderness,   L.Hand and wrist swollen and tender.   Assessment/Plan:  Upper GI bleed with acute blood loss anemia  - presented with melanotic stools in the presence of Eliquis treatment, received 2 units of packed RBC earlier this admission and getting another 2 units on 06/15/2022, she has been placed on PPI was seen by [facility], endoscopy was not done as she was incidentally COVID-positive.  Unfortunately H&H is dropping again on 06/15/2022 and now off of COVID isolation, have reconsulted GI likely EGD on 06/16/2022, discontinued Eliquis again on 06/15/2022, posttransfusion H&H on 06/16/2022 stable.  Await EGD and further guidance by GI.  Will defer resuming Eliquis to GI.  Borderline low B12.  Placed on replacement.    AKI on CKD 4   - AKI likely hemodynamically mediated due to GI bleed, has been adequately transfused, renal ultrasound nonacute, bladder scan stable, on Flomax that she had some intermittent urinary retention earlier this admission.  Nephrology on case.  Clearly fluid overloaded Lasix as tolerated.  Appreciate nephrology input.  Renal function improving with gradual diuresis.  Acute left wrist pain starting night of 06/13/2022 according to the patient after she moved out of bed, appears to be gouty arthritis.  Suspicious for gouty arthritis significantly improved after single dose IV Lasix on 06/14/2022, at a dose of colchicine, as she has CKD 4 avoiding multiple doses, topical Bengay cream continue, appreciate hand surgery input,  x-rays nonacute.  Much improved pain and discomfort.  PNA bacterial along with COVID-19.  Clinically resolved treatment finished.   Acute on chronic systolic and diastolic heart failure EF 35%.  Being diuresed, was seen by cardiology this admission, nephrology  also on board managing diuretics.  Cannot tolerate ACE/ARB due to underlying renal insufficiency.  Beta-blocker as tolerated.    PAF [place] vas 2 score of greater than 4.  On Coreg.  Eliquis hold again on 06/15/2022 as H&H dropping.    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   Weight as of this encounter: 96.7 kg.    Code status:   Code Status: DNR reconfirmed with daughter/patient this morning.  DVT Prophylaxis: Place and maintain sequential compression device Start: 06/15/22 0904 Place TED hose Start: 06/11/22 0547 SCDs Start: 06/05/22 2151    Family Communication:   Daughter-Tricia Gonzales-[phone] updated over the phone on 06/10/22, 06/11/2022, 06/12/22   Disposition Plan: Status is: Inpatient Remains inpatient appropriate because: Upper GI bleed-acute blood loss anemia-PNA-COVID infection-AKI-not yet stable for discharge.   Planned Discharge Destination:Home health   Diet: Diet Order             Diet NPO time specified Except for: Ice Chips, Sips with Meds  Diet effective midnight                    MEDICATIONS: Scheduled Meds:  carvedilol  12.5 mg Oral BID WC   vitamin B-12  1,000 mcg Oral Daily   diclofenac Sodium  2 g Topical QID   febuxostat  40 mg Oral Q M,W,F   furosemide  40 mg Intravenous Once   insulin aspart  0-15 Units Subcutaneous TID WC   levothyroxine   75 mcg Oral QHS   montelukast  10 mg Oral QHS   multivitamin with minerals  1 tablet Oral Daily   pantoprazole  40 mg Oral BID AC   rOPINIRole  0.5 mg Oral QHS   rosuvastatin  5 mg Oral Daily   tamsulosin  0.4 mg Oral Daily   Continuous Infusions:   PRN Meds:.acetaminophen **OR** acetaminophen, HYDROcodone-acetaminophen, naLOXone (NARCAN)  injection, ondansetron (ZOFRAN) IV, sodium chloride   I have personally reviewed following labs and imaging studies  LABORATORY DATA:  Recent Labs  Lab 06/12/22 0402 06/13/22 0509 06/14/22 0647 06/15/22 0646 06/16/22 0741  WBC 8.5 11.6* 14.6* 7.0 9.9  HGB 7.3* 8.5* 7.5* 6.5* 8.8*  HCT 21.3* 25.1* 22.7* 19.0* 26.0*  PLT 228 261 228 182 196  MCV 103.4* 103.3* 101.8* 103.8* 95.6  MCH 35.4* 35.0* 33.6 35.5* 32.4  MCHC 34.3 33.9 33.0 34.2 33.8  RDW Not Measured 20.0* 19.1* 19.9* 17.1*  LYMPHSABS 0.4* 0.4* 0.3* 0.1* 0.3*  MONOABS 0.7 0.8 1.1* 0.2 0.5  EOSABS 0.1 0.1 0.0 0.0 0.0  BASOSABS 0.0 0.0 0.0 0.0 0.0    Recent Labs  Lab 06/10/22 0325 06/11/22 0400 06/12/22 0402 06/13/22 0509 06/14/22 0647 06/15/22 0646 06/16/22 0741  NA 134* 131* 132* 132*  133* 133* 135  K 4.0 4.6 4.6 4.7 5.1 5.3* 5.0  CL 94* 94* 95* 93* 93* 96* 95*  CO2 _0 ANIONGAP _1 GLUCOSE 184* 208* 164* 189* 171* 422* 249*  BUN 153* 150* 151* 152* 152* 157* 153*  CREATININE 3.87* 4.23* 4.65* 4.37* 4.32* 3.99* 3.55*  CRP 5.9* 4.3*  --   --   --   --   --   BNP 2,019.1* 2,120.6*  --   --   --   --   --   MG 2.2 2.1  --   --   --   --   --   CALCIUM 8.2* 8.2* 8.3* 8.4* 8.7* 8.5* 8.9    RADIOLOGY STUDIES/RESULTS: VAS [place] UPPER EXTREMITY VENOUS DUPLEX  Result Date: 06/14/2022 UPPER VENOUS STUDY  Patient Name:  Tricia Gonzales  Date of Exam:   06/14/2022 Medical Rec #: [id number]      Accession #:    [id number] Date of Birth: [birth year]      Patient Gender: F Patient Age:   89 years Exam Location:  [facility] Procedure:       VAS [place] UPPER EXTREMITY VENOUS DUPLEX Referring Phys: Deno Etienne [facility] --------------------------------------------------------------------------------  Indications: Swelling Risk Factors: None identified. Limitations: Poor ultrasound/tissue interface and orthopaedic appliance. Comparison Study: No prior studies. Performing Technologist: Oliver Hum RVT  Examination Guidelines: A complete evaluation includes B-mode imaging, spectral Doppler, color Doppler, and power Doppler as needed of all accessible portions of each vessel. Bilateral testing is considered an integral part of a complete examination. Limited examinations for reoccurring indications may be performed as noted.  Right Findings: +----------+------------+---------+-----------+----------+-------+ RIGHT     CompressiblePhasicitySpontaneousPropertiesSummary +----------+------------+---------+-----------+----------+-------+ IJV           Full       Yes       Yes                      +----------+------------+---------+-----------+----------+-------+ Subclavian    Full       Yes       Yes                      +----------+------------+---------+-----------+----------+-------+ Axillary      Full       Yes       Yes                      +----------+------------+---------+-----------+----------+-------+ Brachial      Full       Yes       Yes                      +----------+------------+---------+-----------+----------+-------+ Radial        Full                                          +----------+------------+---------+-----------+----------+-------+ Ulnar         Full                                          +----------+------------+---------+-----------+----------+-------+ Cephalic      Full                                          +----------+------------+---------+-----------+----------+-------+  Basilic       Full                                           +----------+------------+---------+-----------+----------+-------+  Left Findings: +----------+------------+---------+-----------+----------+-------+ LEFT      CompressiblePhasicitySpontaneousPropertiesSummary +----------+------------+---------+-----------+----------+-------+ IJV           Full       Yes       Yes                      +----------+------------+---------+-----------+----------+-------+ Subclavian    Full       Yes       Yes                      +----------+------------+---------+-----------+----------+-------+ Axillary      Full       Yes       Yes                      +----------+------------+---------+-----------+----------+-------+ Brachial      Full       Yes       Yes                      +----------+------------+---------+-----------+----------+-------+ Radial        Full                                          +----------+------------+---------+-----------+----------+-------+ Ulnar         Full                                          +----------+------------+---------+-----------+----------+-------+ Cephalic      Full                                          +----------+------------+---------+-----------+----------+-------+ Basilic       Full                                          +----------+------------+---------+-----------+----------+-------+  Summary:  Right: No evidence of deep vein thrombosis in the upper extremity. No evidence of superficial vein thrombosis in the upper extremity.  Left: No evidence of deep vein thrombosis in the upper extremity. No evidence of superficial vein thrombosis in the upper extremity.  *See table(s) above for measurements and observations.  Diagnosing physician: Deitra Mayo MD Electronically signed by Deitra Mayo MD on 06/14/2022 at 6:23:42 PM.    Final    DG Wrist 2 Views Left  Result Date: 06/14/2022 CLINICAL DATA:  Wrist pain EXAM: LEFT WRIST - 2 VIEW COMPARISON:  None  Available. FINDINGS: There is radiocarpal and TFCC chondrocalcinosis. There is severe base of thumb osteoarthritis. There is erosive arthritis at the small finger PIP joint, see separately dictated hand radiograph. There is no evidence of acute fracture. There is mild radiocarpal degenerative change. There is generalized soft tissue swelling. Heterotopic ossification in the anteromedial soft tissues of the  forearm. IMPRESSION: Severe base of thumb osteoarthritis. Mild radiocarpal degenerative change with chondrocalcinosis, a finding most commonly seen in osteoarthritis or CPPD arthropathy. No evidence of acute fracture.  Generalized soft tissue swelling. Electronically Signed   By: Maurine Simmering M.D.   On: 06/14/2022 12:06     LOS: 11 days   Signature  -    Lala Lund M.D on 06/16/2022 at 9:13 AM   -  To page go to [email or web address]

## 2022-06-16 NOTE — Anesthesia Preprocedure Evaluation (Addendum)
Anesthesia Evaluation  Patient identified by MRN, date of birth, ID band Patient awake    Reviewed: Allergy & Precautions, NPO status , Patient's Chart, lab work & pertinent test results  Airway Mallampati: III  TM Distance: >3 FB Neck ROM: Full    Dental no notable dental hx. (+) Teeth Intact, Dental Advisory Given, Caps   Pulmonary shortness of breath, pneumonia, resolved, former smoker Hx/o Covid 2 weeks ago   Pulmonary exam normal breath sounds clear to auscultation       Cardiovascular hypertension, Pt. on medications +CHF  Normal cardiovascular exam+ dysrhythmias Atrial Fibrillation + pacemaker + Valvular Problems/Murmurs  Rhythm:Regular Rate:Normal  EKG12/18/23 Atrial fibrillation, RBBB pattern, diffuse repolarization abnormalities c/w ischemia  Echo 05/11/22 1. Left ventricular ejection fraction, by estimation, is 30 to 35%. The  left ventricle demonstrates global hypokinesis. There is mild concentric  left ventricular hypertrophy. Left ventricular diastolic function could  not be evaluated.   2. Right ventricular systolic function is moderately reduced. The right  ventricular size is moderately enlarged. There is severely elevated  pulmonary artery systolic pressure.   3. The mitral valve is normal in structure. Moderate to severe mitral  valve regurgitation. No evidence of mitral stenosis. Moderate mitral  annular calcification.   4. Tricuspid valve regurgitation is severe.   5. The aortic valve is normal in structure. There is mild calcification  of the aortic valve. Aortic valve regurgitation is not visualized. Aortic  valve sclerosis is present, with no evidence of aortic valve stenosis.   6. The inferior vena cava is normal in size with greater than 50%  respiratory variability, suggesting right atrial pressure of 3 mmHg.   Comparison(s): 03/20/22 EF 25-30%. PA pressure 72mHg. Moderate-severe MR.  RV moderately  enlarged.     Neuro/Psych  PSYCHIATRIC DISORDERS Anxiety Depression     Neuromuscular disease    GI/Hepatic Neg liver ROS, hiatal hernia,GERD  Medicated and Controlled,,  Endo/Other  diabetes, Well Controlled, Type 2Hypothyroidism  Morbid obesityHyperlipidemia  Renal/GU CRFRenal disease  negative genitourinary   Musculoskeletal  (+) Arthritis , Osteoarthritis,  Chronic back pain Gout   Abdominal  (+) + obese  Peds  Hematology  (+) Blood dyscrasia, anemia HLD   Anesthesia Other Findings anemia  Reproductive/Obstetrics                              Anesthesia Physical Anesthesia Plan  ASA: 3  Anesthesia Plan: MAC   Post-op Pain Management: Minimal or no pain anticipated   Induction: Intravenous  PONV Risk Score and Plan: 2 and Propofol infusion and Treatment may vary due to age or medical condition  Airway Management Planned: Nasal Cannula  Additional Equipment: None  Intra-op Plan:   Post-operative Plan:   Informed Consent: I have reviewed the patients History and Physical, chart, labs and discussed the procedure including the risks, benefits and alternatives for the proposed anesthesia with the patient or authorized representative who has indicated his/her understanding and acceptance.     Dental advisory given  Plan Discussed with: CRNA  Anesthesia Plan Comments:          Anesthesia Quick Evaluation

## 2022-06-16 NOTE — Progress Notes (Signed)
Physical Therapy Treatment Patient Details Name: Tricia Gonzales MRN: [id number] DOB: 07/23/1940 Today's Date: 06/16/2022   History of Present Illness 81 yo female admitted with fever, general malaise, and dark stools; Covid +; GI on board; with medical history significant for chronic biventricular systolic heart failure, paroxysmal atrial fibrillation complicated by sick sinus syndrome status post pacemaker placement, chronically anticoagulated on Eliquis, chronic hypoxic respiratory failure on 2 L continuous nasal cannula, type 2 diabetes mellitus, CKD 4, anemia    PT Comments    Pt making steady progress. Continue to recommend SNF at DC and will continue to work toward incr mobility and activity tolerance.    Recommendations for follow up therapy are one component of a multi-disciplinary discharge planning process, led by the attending physician.  Recommendations may be updated based on patient status, additional functional criteria and insurance authorization.  Follow Up Recommendations  Skilled nursing-short term rehab (<3 hours/day) Can patient physically be transported by private vehicle: Yes   Assistance Recommended at Discharge Frequent or constant Supervision/Assistance  Patient can return home with the following A little help with walking and/or transfers;A little help with bathing/dressing/bathroom;Help with stairs or ramp for entrance;Assist for transportation;Assistance with cooking/housework   Equipment Recommendations  None recommended by PT    Recommendations for Other Services       Precautions / Restrictions Precautions Precautions: Fall;Other (comment) Precaution Comments: Watch SpO2 (does not wear O2 baseline) COVID (off precautions now) Restrictions Weight Bearing Restrictions: No     Mobility  Bed Mobility Overal bed mobility: Needs Assistance Bed Mobility: Supine to Sit     Supine to sit: Min assist, HOB elevated     General bed mobility comments:  Assist to elevate trunk into sitting    Transfers Overall transfer level: Needs assistance Equipment used: Rolling walker (2 wheels) Transfers: Sit to/from Stand Sit to Stand: Min assist, Min guard           General transfer comment: Assist to bring hips up from low commode. Min guard from elevated bed.    Ambulation/Gait Ambulation/Gait assistance: Min guard Gait Distance (Feet): 20 Feet (x 2) Assistive device: Rolling walker (2 wheels) Gait Pattern/deviations: Step-through pattern, Decreased stride length, Wide base of support, Trunk flexed Gait velocity: decr Gait velocity interpretation: <1.8 ft/sec, indicate of risk for recurrent falls   General Gait Details: Assist for safety/balance   Stairs             Wheelchair Mobility    Modified Rankin (Stroke Patients Only)       Balance Overall balance assessment: Needs assistance Sitting-balance support: Feet supported Sitting balance-Leahy Scale: Good     Standing balance support: Single extremity supported, During functional activity Standing balance-Leahy Scale: Poor Standing balance comment: UE support and min guard for static standing                            Cognition Arousal/Alertness: Awake/alert Behavior During Therapy: WFL for tasks assessed/performed Overall Cognitive Status: Within Functional Limits for tasks assessed                                          Exercises      General Comments General comments (skin integrity, edema, etc.): SpO2 89-91% after amb on RA. Dropped to 88% after sitting in chair for several minutes so replaced O2.  Pertinent Vitals/Pain Pain Assessment Pain Assessment: Faces Faces Pain Scale: Hurts a little bit Pain Location: Left hand Pain Descriptors / Indicators: Grimacing, Guarding, Sore Pain Intervention(s): Limited activity within patient's tolerance, Repositioned    Home Living                           Prior Function            PT Goals (current goals can now be found in the care plan section) Progress towards PT goals: Progressing toward goals    Frequency    Min 3X/week      PT Plan Current plan remains appropriate    Co-evaluation              AM-PAC PT "6 Clicks" Mobility   Outcome Measure  Help needed turning from your back to your side while in a flat bed without using bedrails?: A Little Help needed moving from lying on your back to sitting on the side of a flat bed without using bedrails?: A Little Help needed moving to and from a bed to a chair (including a wheelchair)?: A Little Help needed standing up from a chair using your arms (e.g., wheelchair or bedside chair)?: A Little Help needed to walk in hospital room?: A Little Help needed climbing 3-5 steps with a railing? : A Lot 6 Click Score: 17    End of Session         PT Visit Diagnosis: Other abnormalities of gait and mobility (R26.89)     Time: 1610-9604 PT Time Calculation (min) (ACUTE ONLY): 32 min  Charges:  $Gait Training: 23-37 mins                     [place] [facility] [place] 06/16/2022, 10:37 AM

## 2022-06-16 NOTE — Progress Notes (Signed)
Patient ID: Tricia Gonzales, female   DOB: [birth year], 81 y.o.   MRN: [id number] S: No new complaints. Had EGD today showing some gastric erosions vs AVM, rx'd w/ APC. Creat down 3.9 > 3.5 today.    O:BP 136/80   Pulse 81   Temp 97.7 F (36.5 C) (Temporal)   Resp 13   Ht _0  (1.473 m)   Wt 96.7 kg   SpO2 99%   BMI 44.56 kg/m  Exam Gen:NAD CVS: RRR Resp:CTA Abd: +BS, soft, NT/ND Ext: diffuse 1+ LE edema bilat    Date   Creat  eGFR   2017   1.22- 2.34   2018   1.60   2019   1.43- 1.87   2020   1.69- 2.02 Aug 2020  1.79- 2.43   Mar-may 2022 1.95- 2.74 17- 26 ml/min   Nov 2022  2.40  20   Feb - oct 2023 1.80- 2.16 25- 28 ml/min    11/29- 06/02/22  2.41- 3.35 13- 19 ml/min   12/12-  06/06/22 3.32- 4.00 10- 13 ml/min     12/15  4.10     06/15/22  3.99     12/26   3.55    UA 12/15 - 6-10 rbc, 0-5 wbc, 30 protein   UNa 31, UCr 65     Renal US - 9.6 / 10.1 cm. IMPRESSION: Bilateral renal cortical thinning is noted. Bilateral renal cysts are noted. No hydronephrosis or renal obstruction is noted     CXR 12/1 - IMPRESSION: Retrocardiac atelectasis/consolidation. Pneumonia is difficult to exclude. Cardiomegaly and pulmonary vascular congestion.  Assessment/Plan:  AKI on CKD 4 - b/l creatinine 1.8- 2.1 from early-mid 2023, eGFR 25/-28 ml/min. Initially was thought to have some volume depletion with watery stools and possible ischemic ATN in setting of ABLA and soft BP's.  Now with possible cardiorenal syndrome.  Last dose of IV lasix was 06/09/22.  She also had some urinary retention and was started on Flomax. Renal US w/ negative for obstruction. Pt confirms that she does not want dialysis and understands the consequences if her kidneys should fail completely. Creat peaked at 4.6 on 12/22 and is down to 3.9 yest and 3.5 today.  Continue to follow bladder scans and I&O cath as needed.  Cont supportive care.  PNA - likely bacterial. SP course of IV abx. Per pmd.  CHRF - on home O2 at 2  L [place] Covid-19 infection - s/p remdesivir x 3 days, stable on 2 L O2 via [place] per primary Chronic CHF - with severe MR and TR, reduced EF 30-35% and moderately reduced RV function. Seen by cardiology, plan for outpatient upgrade of PPM to BiV device.  Is getting IV lasix 39m daily as needed now.  Acute on chronic anemia with heme + stools - GI following, but holding off on EGD until her respiratory status improves. Resumed Eliquis per GI. UGIB - as above, transfuse prn. 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 HTN - BP stable DM type 2 - per primary.  Disposition - awaiting SNF placement given debilitation.  RKelly Splinter MD 06/16/2022, 4:39 PM  Recent Labs  Lab 06/15/22 0646 06/16/22 0741  HGB 6.5* 8.8*  CALCIUM 8.5* 8.9  CREATININE 3.99* 3.55*  K 5.3* 5.0     Inpatient medications:  carvedilol  12.5 mg Oral BID WC   vitamin B-12  1,000  mcg Oral Daily   diclofenac Sodium  2 g Topical QID   febuxostat  40 mg Oral Q M,W,F   insulin aspart  0-15 Units Subcutaneous TID WC   levothyroxine  75 mcg Oral QHS   montelukast  10 mg Oral QHS   multivitamin with minerals  1 tablet Oral Daily   pantoprazole  40 mg Oral BID AC   rOPINIRole  0.5 mg Oral QHS   rosuvastatin  5 mg Oral Daily   tamsulosin  0.4 mg Oral Daily    acetaminophen **OR** acetaminophen, HYDROcodone-acetaminophen, naLOXone (NARCAN)  injection, ondansetron (ZOFRAN) IV, sodium chloride

## 2022-06-17 DIAGNOSIS — E039 Hypothyroidism, unspecified: Secondary | ICD-10-CM | POA: Diagnosis not present

## 2022-06-17 DIAGNOSIS — E119 Type 2 diabetes mellitus without complications: Secondary | ICD-10-CM | POA: Diagnosis not present

## 2022-06-17 DIAGNOSIS — I5022 Chronic systolic (congestive) heart failure: Secondary | ICD-10-CM | POA: Diagnosis not present

## 2022-06-17 DIAGNOSIS — K922 Gastrointestinal hemorrhage, unspecified: Secondary | ICD-10-CM | POA: Diagnosis not present

## 2022-06-17 LAB — CBC WITH DIFFERENTIAL/PLATELET
Abs Immature Granulocytes: 0.03 10*3/uL (ref 0.00–0.07)
Basophils Absolute: 0 10*3/uL (ref 0.0–0.1)
Basophils Relative: 0 %
Eosinophils Absolute: 0.2 10*3/uL (ref 0.0–0.5)
Eosinophils Relative: 3 %
HCT: 27.8 % — ABNORMAL LOW (ref 36.0–46.0)
Hemoglobin: 8.8 g/dL — ABNORMAL LOW (ref 12.0–15.0)
Immature Granulocytes: 0 %
Lymphocytes Relative: 6 %
Lymphs Abs: 0.4 10*3/uL — ABNORMAL LOW (ref 0.7–4.0)
MCH: 31.3 pg (ref 26.0–34.0)
MCHC: 31.7 g/dL (ref 30.0–36.0)
MCV: 98.9 fL (ref 80.0–100.0)
Monocytes Absolute: 0.4 10*3/uL (ref 0.1–1.0)
Monocytes Relative: 6 %
Neutro Abs: 6.1 10*3/uL (ref 1.7–7.7)
Neutrophils Relative %: 85 %
Platelets: 199 10*3/uL (ref 150–400)
RBC: 2.81 MIL/uL — ABNORMAL LOW (ref 3.87–5.11)
RDW: 16.4 % — ABNORMAL HIGH (ref 11.5–15.5)
WBC: 7.2 10*3/uL (ref 4.0–10.5)
nRBC: 0 % (ref 0.0–0.2)

## 2022-06-17 LAB — BASIC METABOLIC PANEL
Anion gap: 9 (ref 5–15)
BUN: 152 mg/dL — ABNORMAL HIGH (ref 8–23)
CO2: 31 mmol/L (ref 22–32)
Calcium: 8.7 mg/dL — ABNORMAL LOW (ref 8.9–10.3)
Chloride: 95 mmol/L — ABNORMAL LOW (ref 98–111)
Creatinine, Ser: 3.37 mg/dL — ABNORMAL HIGH (ref 0.44–1.00)
GFR, Estimated: 13 mL/min — ABNORMAL LOW (ref >60)
Glucose, Bld: 200 mg/dL — ABNORMAL HIGH (ref 70–99)
Potassium: 4.5 mmol/L (ref 3.5–5.1)
Sodium: 135 mmol/L (ref 135–145)

## 2022-06-17 LAB — GLUCOSE, CAPILLARY
Glucose-Capillary: 196 mg/dL — ABNORMAL HIGH (ref 70–99)
Glucose-Capillary: 198 mg/dL — ABNORMAL HIGH (ref 70–99)
Glucose-Capillary: 199 mg/dL — ABNORMAL HIGH (ref 70–99)
Glucose-Capillary: 202 mg/dL — ABNORMAL HIGH (ref 70–99)
Glucose-Capillary: 208 mg/dL — ABNORMAL HIGH (ref 70–99)

## 2022-06-17 MED ORDER — SENNOSIDES-DOCUSATE SODIUM 8.6-50 MG PO TABS
2.0000 | ORAL_TABLET | Freq: Two times a day (BID) | ORAL | Status: DC
  Administered 2022-06-17 – 2022-06-18 (×4): 2 via ORAL
  Filled 2022-06-17 (×4): qty 2

## 2022-06-17 MED ORDER — POLYETHYLENE GLYCOL 3350 17 G PO PACK
17.0000 g | PACK | Freq: Every day | ORAL | Status: DC
  Administered 2022-06-18: 17 g via ORAL
  Filled 2022-06-17: qty 1

## 2022-06-17 MED ORDER — INSULIN GLARGINE-YFGN 100 UNIT/ML ~~LOC~~ SOLN
8.0000 [IU] | Freq: Every day | SUBCUTANEOUS | Status: DC
  Administered 2022-06-17 – 2022-06-19 (×3): 8 [IU] via SUBCUTANEOUS
  Filled 2022-06-17 (×3): qty 0.08

## 2022-06-17 NOTE — Progress Notes (Signed)
Patient ID: Tricia Gonzales, female   DOB: 1941-03-18, 81 y.o.   MRN: [id number] S: No new complaints. Creat down to 3.3 today.   O:BP (!) 129/54 (BP Location: Right Arm)   Pulse 60   Temp 97.6 F (36.4 C) (Oral)   Resp 18   Ht 4' 10" (1.473 m)   Wt 95.2 kg   SpO2 95%   BMI 43.86 kg/m  Exam Gen:NAD CVS: RRR Resp:CTA Abd: +BS, soft, NT/ND Ext: diffuse 1+ LE edema bilat    Date   Creat  eGFR   2017   1.22- 2.34   2018   1.60   2019   1.43- 1.87   2020   1.69- 2.02 Aug 2020  1.79- 2.43   Mar-may 2022 1.95- 2.74 17- 26 ml/min   Nov 2022  2.40  20   Feb - oct 2023 1.80- 2.16 25- 28 ml/min    11/29- 06/02/22  2.41- 3.35 13- 19 ml/min   12/12-  06/06/22 3.32- 4.00 10- 13 ml/min     12/15  4.10     06/15/22  3.99     12/26   3.55    UA 12/15 - 6-10 rbc, 0-5 wbc, 30 protein   UNa 31, UCr 65     Renal US - 9.6 / 10.1 cm. IMPRESSION: Bilateral renal cortical thinning is noted. Bilateral renal cysts are noted. No hydronephrosis or renal obstruction is noted     CXR 12/1 - IMPRESSION: Retrocardiac atelectasis/consolidation. Pneumonia is difficult to exclude. Cardiomegaly and pulmonary vascular congestion.  Assessment/Plan:  AKI on CKD 4 - b/l creatinine 1.8- 2.1 from early-mid 2023, eGFR 25/-28 ml/min. Initially was thought to have some volume depletion with watery stools and possible ischemic ATN in setting of ABLA and soft BP's.  Now with possible cardiorenal syndrome.  Last dose of IV lasix was 06/09/22.  She also had some urinary retention and was started on Flomax. Renal US w/ negative for obstruction. Pt confirms that she does not want dialysis and understands the consequences if her kidneys should fail completely (pt followed by Dr Moshe Cipro at [facility]). Creat peaked at 4.6 on 12/22 and is down to 3.5 yest and 3.3 today. Suspect declining renal function will continue to be a issue for her. We discussed briefly what signs to look out for regarding esrd, and what hospice transition  usually looks like. Pt knows to f/u w/ Dr Moshe Cipro in clinic. Will sign off. Please call as needed.  PNA - likely bacterial. SP course of IV abx. Per pmd.  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 Seen by cardiology, plan for outpatient upgrade of PPM to BiV device.  Is getting IV lasix 64m daily as needed now.  Acute on chronic anemia with heme + stools - GI following, but holding off on EGD until her respiratory status improves. Resumed Eliquis per GI. UGIB - as above, transfuse prn. 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 HTN - BP stable DM type 2 - per primary.  Disposition - awaiting SNF placement given debilitation.  RKelly Splinter MD 06/17/2022, 1:43 PM  Recent Labs  Lab 06/16/22 0741 06/17/22 0528  HGB 8.8* 8.8*  CALCIUM 8.9  8.7*  CREATININE 3.55* 3.37*  K 5.0 4.5     Inpatient medications:  carvedilol  12.5 mg Oral BID WC   vitamin B-12  1,000 mcg Oral Daily   diclofenac Sodium  2 g Topical QID   febuxostat  40 mg Oral Q M,W,F   insulin aspart  0-15 Units Subcutaneous TID WC   insulin glargine-yfgn  8 Units Subcutaneous Daily   levothyroxine  75 mcg Oral QHS   montelukast  10 mg Oral QHS   multivitamin with minerals  1 tablet Oral Daily   pantoprazole  40 mg Oral BID AC   polyethylene glycol  17 g Oral Daily   rOPINIRole  0.5 mg Oral QHS   rosuvastatin  5 mg Oral Daily   senna-docusate  2 tablet Oral BID   tamsulosin  0.4 mg Oral Daily    acetaminophen **OR** acetaminophen, HYDROcodone-acetaminophen, naLOXone (NARCAN)  injection, ondansetron (ZOFRAN) IV, sodium chloride

## 2022-06-17 NOTE — Progress Notes (Signed)
[facility]                                                                               Tricia Gonzales, is a 81 y.o. female, DOB - April 16, 1941, DXA:[id number] Admit date - 06/05/2022    Outpatient Primary MD for the patient is Carol Ada, MD  LOS - 12  days    Brief summary   Patient is a 81 y.o.  female with history of chronic HFrEF, PAF on Eliquis, sick sinus syndrome-s/p PPM placement, chronic hypoxic respiratory failure on home O2, DM-2, CKD 4 who presented with subjective fever-was found to have PNA/COVID-19 infection, melena/GI bleed with acute blood loss anemia and AKI on CKD 4.   She had incidental COVID-19 infection hence initially GI did not want to pursue any endoscopies, she was cleared to resume Eliquis by [facility], she subsequently developed AKI on CKD 4 seen by nephrology, left wrist gouty arthritis, upon resumption of Eliquis she developed gradual decline in H&H despite being diuresed by nephrology, she received 2 units of packed RBC on 06/15/2022, Eliquis was discontinued and GI was re consulted. Patient underwent EGD  showing abnormal esophageal motility, erosive gastropathy with stigmata of recent bleeding. Treated with APC. GI also recommended to re evaluate restarting Eliquis  and if absolutely necessary to start in a few days. Will start the Eliquis in 5 days.   Significant events: 11/30-12/07>> hospitalization- acute on chronic HFrEF 12/15>> hospitalization for COVID infection/PNA/possible GI bleed/AKI on CKD 4.   Significant studies: 11/20>> echo: EF 20-94%, RV systolic function moderately reduced. 12/15>> CXR: Retrocardiac consolidation/atelectasis.   Significant microbiology data: 12/15>> COVID PCR: Positive (CT value 26.1) 12/15>> influenza A/B/RSV: Negative       Assessment & Plan    Assessment and Plan:  Upper GI bleed with acute blood loss anemia: S/p  4 units of prbc transfusion.  Continue with PPI.  S/P EGD ,  showing abnormal  esophageal motility, erosive gastropathy with stigmata of recent bleeding. Treated with APC. GI also recommended to re evaluate restarting Eliquis  and if absolutely necessary to start in a few days. Will start the Eliquis in 5 days.    Acute kidney injury superimposed on stage 4 CKD:  Nephrology on board.  No further recommendations.  Renal function slowly improving.   Acute left wrist pain starting night of 06/13/2022 according to the patient after she moved out of bed, appears to be gouty arthritis.  - much improved with colchicine.  - X rays are non acute.  - pain control.     Pneumonia / COVID 19 infection Completed treatment.     Acute on chronic systolic and diastolic CHF LVEF IS 70% No decompensation.  She remains on 2 lit of [place] OXYGEN     PAF  Rate controlled.  Eliquis held due to Upper GI bleed.  To be resumed in a few days if hemoglobin stays stable.     Pulmonary hypertension, mod to severe MR, Severe TR.  Chronic hypoxic respiratory failure req 2 lit of [place] oxygen  She denies any sob.   Hypertension:  Well controlled.    Hyperlipidemia:  Statin.    Hypothyroidism:  Continue with synthroid.    Body mass index is 43.86 kg/m. Morbid Obesity.   Insulin dependent DM;  CBG (last 3)  Recent Labs    06/17/22 0749 06/17/22 1231 06/17/22 1723  GLUCAP 208* 199* 196*   Resume SSI.  Continue with semglee and SSI.     RN Pressure Injury Documentation:    Malnutrition Type:  Nutrition Problem: Increased nutrient needs Etiology: acute illness   Malnutrition Characteristics:  Signs/Symptoms: estimated needs   Nutrition Interventions:  Interventions: MVI, Liberalize Diet  Estimated body mass index is 43.86 kg/m as calculated from the following:   Height as of this encounter: _0  (1.473 m).   Weight as of this encounter: 95.2 kg.  Code Status: DNR DVT Prophylaxis:  Place and maintain sequential compression device Start: 06/15/22  0904 Place TED hose Start: 06/11/22 0547 SCDs Start: 06/05/22 2151   Level of Care: Level of care: Med-Surg Family Communication: family at bedside.   Disposition Plan:    WAITING FOR SNF. She is medically stable for discharge. Discussed the plan with the patient and family at bedside. Toc aware.   Procedures:  EGD.   Consultants:   Nephrology Gastroenterology.   Antimicrobials:   Anti-infectives (From admission, onward)    Start     Dose/Rate Route Frequency Ordered Stop   06/09/22 1000  azithromycin (ZITHROMAX) tablet 250 mg        250 mg Oral Daily 06/09/22 0629 06/11/22 0935   06/07/22 1000  remdesivir 100 mg in sodium chloride 0.9 % 100 mL IVPB       See Hyperspace for full Linked Orders Report.   100 mg 200 mL/hr over 30 Minutes Intravenous Daily 06/06/22 0927 06/08/22 1131   06/06/22 1700  cefTRIAXone (ROCEPHIN) 1 g in sodium chloride 0.9 % 100 mL IVPB  Status:  Discontinued        1 g 200 mL/hr over 30 Minutes Intravenous Every 24 hours 06/05/22 2158 06/09/22 0629   06/06/22 1700  azithromycin (ZITHROMAX) 500 mg in sodium chloride 0.9 % 250 mL IVPB  Status:  Discontinued        500 mg 250 mL/hr over 60 Minutes Intravenous Every 24 hours 06/05/22 2158 06/09/22 0629   06/06/22 1100  remdesivir 200 mg in sodium chloride 0.9% 250 mL IVPB       See Hyperspace for full Linked Orders Report.   200 mg 580 mL/hr over 30 Minutes Intravenous Once 06/06/22 0927 06/06/22 1250   06/05/22 2115  cefTRIAXone (ROCEPHIN) 1 g in sodium chloride 0.9 % 100 mL IVPB        1 g 200 mL/hr over 30 Minutes Intravenous  Once 06/05/22 2108 06/05/22 2306   06/05/22 2115  azithromycin (ZITHROMAX) 500 mg in sodium chloride 0.9 % 250 mL IVPB        500 mg 250 mL/hr over 60 Minutes Intravenous  Once 06/05/22 2108 06/06/22 0109        Medications  Scheduled Meds:  carvedilol  12.5 mg Oral BID WC   vitamin B-12  1,000 mcg Oral Daily   diclofenac Sodium  2 g Topical QID   febuxostat  40 mg  Oral Q M,W,F   insulin aspart  0-15 Units Subcutaneous TID WC   insulin glargine-yfgn  8 Units Subcutaneous Daily   levothyroxine  75 mcg Oral QHS   montelukast  10 mg Oral QHS   multivitamin with minerals  1 tablet Oral Daily   pantoprazole  40 mg Oral BID AC  polyethylene glycol  17 g Oral Daily   rOPINIRole  0.5 mg Oral QHS   rosuvastatin  5 mg Oral Daily   senna-docusate  2 tablet Oral BID   tamsulosin  0.4 mg Oral Daily   Continuous Infusions: PRN Meds:.acetaminophen **OR** acetaminophen, HYDROcodone-acetaminophen, naLOXone (NARCAN)  injection, ondansetron (ZOFRAN) IV, sodium chloride    Subjective:   Tricia Gonzales was seen and examined today.  No new complaints.   Objective:   Vitals:   06/17/22 0500 06/17/22 0745 06/17/22 1230 06/17/22 1721  BP: 123/60 128/60 (!) 129/54 (!) 118/49  Pulse: 78  60 83  Resp: _0 Temp: (!) 97.2 F (36.2 C) (!) 97.5 F (36.4 C) 97.6 F (36.4 C) 97.6 F (36.4 C)  TempSrc: Oral Axillary Oral Oral  SpO2: 100% 95% 95% 98%  Weight: 95.2 kg     Height:       No intake or output data in the 24 hours ending 06/17/22 1914 Filed Weights   06/16/22 0500 06/16/22 1157 06/17/22 0500  Weight: 96.7 kg 96.7 kg 95.2 kg     Exam General exam: Appears calm and comfortable  Respiratory system: Clear to auscultation. Respiratory effort normal. On 2 lit of [place] oxygen.  Cardiovascular system: S1 & S2 heard, RRR. No JVD,  Gastrointestinal system: Abdomen is nondistended, soft and nontender.  Central nervous system: Alert and oriented. No focal neurological deficits. Extremities: Symmetric 5 x 5 power. Skin: No rashes, lesions or ulcers Psychiatry:  Mood & affect appropriate.    Data Reviewed:  I have personally reviewed following labs and imaging studies   CBC Lab Results  Component Value Date   WBC 7.2 06/17/2022   RBC 2.81 (L) 06/17/2022   HGB 8.8 (L) 06/17/2022   HCT 27.8 (L) 06/17/2022   MCV 98.9 06/17/2022   MCH 31.3  06/17/2022   PLT 199 06/17/2022   MCHC 31.7 06/17/2022   RDW 16.4 (H) 06/17/2022   LYMPHSABS 0.4 (L) 06/17/2022   MONOABS 0.4 06/17/2022   EOSABS 0.2 06/17/2022   BASOSABS 0.0 61/53/7943     Last metabolic panel Lab Results  Component Value Date   NA 135 06/17/2022   K 4.5 06/17/2022   CL 95 (L) 06/17/2022   CO2 31 06/17/2022   BUN 152 (H) 06/17/2022   CREATININE 3.37 (H) 06/17/2022   GLUCOSE 200 (H) 06/17/2022   GFRNONAA 13 (L) 06/17/2022   GFRAA 24 (L) 08/05/2020   CALCIUM 8.7 (L) 06/17/2022   PHOS 4.5 06/07/2022   PROT 5.2 (L) 06/06/2022   ALBUMIN 2.4 (L) 06/07/2022   LABGLOB 2.4 07/05/2018   AGRATIO 1.3 07/05/2018   BILITOT 1.5 (H) 06/06/2022   ALKPHOS 63 06/06/2022   AST 24 06/06/2022   ALT 12 06/06/2022   ANIONGAP 9 06/17/2022    CBG (last 3)  Recent Labs    06/17/22 0749 06/17/22 1231 06/17/22 1723  GLUCAP 208* 199* 196*      Coagulation Profile: No results for input(s): "INR", "PROTIME" in the last 168 hours.   Radiology Studies: No results found.     Hosie Poisson M.D. [facility] 06/17/2022, 7:14 PM  Available via Epic secure chat 7am-7pm After 7 pm, please refer to night coverage provider listed on amion.

## 2022-06-17 NOTE — Progress Notes (Signed)
Tricia Gonzales 9:33 AM  Subjective: Patient seen and examined and no signs of bleeding and no new complaints and she seems to be eating well  Objective: Vital signs stable afebrile no acute distress abdomen is soft nontender BUN and creatinine slight decrease hemoglobin stable  Assessment: GAVE status post APC  Plan: Please call me if I could be of any further assistance with this hospital stay and no further GI workup or recommendations at this time  [facility]  office [phone] After 5PM or if no answer call [phone]

## 2022-06-17 NOTE — TOC Progression Note (Addendum)
Transition of Care [facility]) - Progression Note    Patient Details  Name: Tricia Gonzales MRN: [id number] Date of Birth: 12/08/1940  Transition of [facility]) CM/SW [place], LCSW Phone Number: 06/17/2022, 9:03 AM  Clinical Narrative:    9am-[facility] reviewing referral.   2pm-[facility] and [facility] unable to accept patient. No beds at [facility] or [facility]. CSW updated patient's daughter. She would like to go with [facility].   [facility] can [facility] patient when ready. Will start insurance process.  Update: Insurance approval received, Ref# [id number], effective  06/18/2022-06/22/2022.  Expected Discharge Plan: [place] Barriers to Discharge: Insurance Authorization, SNF Covid, SNF Pending bed offer  Expected Discharge Plan and Services In-house Referral: Clinical Social Work   Post Acute Care Choice: [place] arrangements for the past 2 months: [employer]: PT, Nurse's Aide [place] Agency: [place] Date [place]: 06/08/22   Representative spoke with at [place]: [place] ([place]) Interventions SDOH Screenings   Food Insecurity: No Food Insecurity (06/06/2022)  Housing: Low Risk  (06/06/2022)  Transportation Needs: No Transportation Needs (06/06/2022)  Utilities: Not At Risk (06/06/2022)  Alcohol Screen: Low Risk  (05/22/2022)  Financial Resource Strain: Low Risk  (05/22/2022)  Tobacco Use: Medium Risk (06/16/2022)    Readmission Risk Interventions    08/20/2020    1:20 PM  Readmission Risk Prevention Plan  Transportation Screening Complete  PCP or Specialist Appt within 3-5 Days Complete  HRI or [place] Complete  Social Work Consult for [place] Planning/Counseling Complete  Palliative Care Screening Not Applicable  Medication Review [employer]) Complete

## 2022-06-18 ENCOUNTER — Encounter (HOSPITAL_COMMUNITY): Payer: Self-pay | Admitting: Gastroenterology

## 2022-06-18 ENCOUNTER — Inpatient Hospital Stay (HOSPITAL_COMMUNITY): Payer: Medicare Other

## 2022-06-18 DIAGNOSIS — R338 Other retention of urine: Secondary | ICD-10-CM

## 2022-06-18 DIAGNOSIS — D62 Acute posthemorrhagic anemia: Secondary | ICD-10-CM | POA: Diagnosis not present

## 2022-06-18 DIAGNOSIS — K5900 Constipation, unspecified: Secondary | ICD-10-CM

## 2022-06-18 DIAGNOSIS — N179 Acute kidney failure, unspecified: Secondary | ICD-10-CM | POA: Diagnosis not present

## 2022-06-18 DIAGNOSIS — K922 Gastrointestinal hemorrhage, unspecified: Secondary | ICD-10-CM | POA: Diagnosis not present

## 2022-06-18 DIAGNOSIS — E039 Hypothyroidism, unspecified: Secondary | ICD-10-CM | POA: Diagnosis not present

## 2022-06-18 LAB — CBC WITH DIFFERENTIAL/PLATELET
Abs Immature Granulocytes: 0.04 10*3/uL (ref 0.00–0.07)
Basophils Absolute: 0 10*3/uL (ref 0.0–0.1)
Basophils Relative: 0 %
Eosinophils Absolute: 0.3 10*3/uL (ref 0.0–0.5)
Eosinophils Relative: 4 %
HCT: 25.7 % — ABNORMAL LOW (ref 36.0–46.0)
Hemoglobin: 8.4 g/dL — ABNORMAL LOW (ref 12.0–15.0)
Immature Granulocytes: 1 %
Lymphocytes Relative: 6 %
Lymphs Abs: 0.4 10*3/uL — ABNORMAL LOW (ref 0.7–4.0)
MCH: 31.5 pg (ref 26.0–34.0)
MCHC: 32.7 g/dL (ref 30.0–36.0)
MCV: 96.3 fL (ref 80.0–100.0)
Monocytes Absolute: 0.4 10*3/uL (ref 0.1–1.0)
Monocytes Relative: 7 %
Neutro Abs: 4.7 10*3/uL (ref 1.7–7.7)
Neutrophils Relative %: 82 %
Platelets: 169 10*3/uL (ref 150–400)
RBC: 2.67 MIL/uL — ABNORMAL LOW (ref 3.87–5.11)
RDW: 16.1 % — ABNORMAL HIGH (ref 11.5–15.5)
WBC: 5.8 10*3/uL (ref 4.0–10.5)
nRBC: 0 % (ref 0.0–0.2)

## 2022-06-18 LAB — GLUCOSE, CAPILLARY
Glucose-Capillary: 164 mg/dL — ABNORMAL HIGH (ref 70–99)
Glucose-Capillary: 156 mg/dL — ABNORMAL HIGH (ref 70–99)
Glucose-Capillary: 87 mg/dL (ref 70–99)
Glucose-Capillary: 103 mg/dL — ABNORMAL HIGH (ref 70–99)
Glucose-Capillary: 97 mg/dL (ref 70–99)
Glucose-Capillary: 100 mg/dL — ABNORMAL HIGH (ref 70–99)

## 2022-06-18 MED ORDER — BISACODYL 10 MG RE SUPP
10.0000 mg | Freq: Once | RECTAL | Status: AC
  Administered 2022-06-18: 10 mg via RECTAL
  Filled 2022-06-18: qty 1

## 2022-06-18 NOTE — Discharge Summary (Signed)
Physician Discharge Summary   Patient: Tricia Gonzales MRN: [id number] DOB: October 08, 1940  Admit date:     06/05/2022  Discharge date: 06/19/22  Discharge Physician: Cordelia Poche, MD   PCP: Carol Ada, MD   Recommendations at discharge:  PCP follow-up Follow-up with nephrology, Dr. Moshe Cipro Consider palliative care/hospice referral if renal function continues to worsen; patient declines hemodialysis as treatment for renal disease Restart Eliquis on 06/22/2022  Discharge Diagnoses: Principal Problem:   Acute upper GI bleed Active Problems:   HLD (hyperlipidemia)   DM2 (diabetes mellitus, type 2) (HCC)   Paroxysmal atrial fibrillation (HCC)   Acute renal failure superimposed on stage 4 chronic kidney disease ([place])   Acute blood loss anemia   CAP (community acquired pneumonia)   Hypokalemia   Chronic systolic CHF (congestive heart failure) ([place])   Acquired hypothyroidism  Resolved Problems:   * No resolved hospital problems. *  Hospital Course: Tricia Gonzales is a 81 y.o. female with a history of chronic systolic heart failure, paroxysmal atrial fibrillation, sick sinus syndrome s/p PPM, chronic respiratory failure with hypoxia on home oxygen, diabetes mellitus type 2, CKD stage IV. Patient presented secondary to fever and was found to have COVID-19 infection with associated pneumonia in addition to evidence of acute blood loss anemia from GI bleeding. Patient managed on Remdesivir, Ceftriaxone and azithromycin for pneumonia and completed course. For GI bleed, GI was consulted and performed an upper endoscopy, revealing evidence of erosive gastropathy. Patient required 5 units of PRBC during hospitalization. Hemoglobin stabilized.  Assessment and Plan: Acute upper GI bleed [facility] gastroenterology consulted. Upper endoscopy performed on 12/26 and was significant for abnormal esophageal motility, erosive gastropathy. GI recommendation to continue PPI daily and restart anticoagulation  "in a few days if absolutely needed." Recommendations to follow-up with GI outpatient as needed.   Acute blood loss anemia Secondary to GI bleeding. Hemoglobin as low as 5.6 this admission. Patient required a total of 5 units of PRBC. Most recent hemoglobin of 8.4. Stable.   AKI on CKD stage IV Concern for possible ischemic ATN in setting of acute blood loss anemia and low-normal blood pressure and/or cardiorenal syndrome. Nephrology consulted. Patient with desire for no hemodialysis. Nephrology with recommendations for outpatient nephrology follow-up. If worsening renal function, recommend referral to hospice.   Acute urinary retention Recurrent. Patient started on Flomax. Associated constipation. Patient unable to pass voiding trial. Foley catheter placed on 12/29, prior to discharge. Recommendation for urology follow-up for voiding trial/management.   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 Orthopedic surgery consulted and recommended conservative management with ice, antibiotics and solu-medrol x1. Symptoms resolved. Uric acid obtained and is 9.3. Once symptoms are resolved and kidney function stabilized, can consider starting allopurinol, dosed renally.   COVID-19 infection Patient completed Remdesivir. No steroids given for treatment. Patient is out of window for isolation.   Pneumonia Secondary to COVID-19 infection with concern for superimposed bacterial infection. Patient treated with Remdesivir and Ceftriaxone/Azithromycin. Antibiotic course completed.   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 Patient diuresed while inpatient. Cardiology consulted and plan to upgrade patient's PPM to BiV device. Patient to follow-up with cardiology as an outpatient. Recommendation for BNP and BMP one week from discharge.   Paroxysmal atrial fibrillation Eliquis held secondary to GI bleeding. Resume Eliquis on 06/22/2022.   Pulmonary hypertension Moderate-severe mitral  regurgitation Severe regurgitation Patient follows with cardiology as an outpatient.   Primary hypertension Continue Coreg   Hyperlipidemia Continue Crestor   Hypothyroidism  Continue levothyroxine   Diabetes mellitus, type 2 Most recent hemoglobin A1C of 7.7%. Patient managed on insulin while inpatient which will be discontinued on discharge. Recommend diet modification/low carbohydrate intake.   Constipation No obstruction on abdominal x-ray. Constipation resolved with dulcolax suppository.   Morbid obesity Estimated body mass index is 43.86 kg/m as calculated from the following:   Height as of this encounter: _0  (1.473 m).   Weight as of this encounter: 95.2 kg.   Consultants: Cardiology, Gastroenterology, Orthopedic surgery Procedures performed: None  Disposition: Skilled nursing facility Diet recommendation: Renal diet   DISCHARGE MEDICATION: Allergies as of 06/19/2022       Reactions   Norvasc [amlodipine Besylate] Shortness Of Breath   Other Rash, Other (See Comments)   Pt voiced she is allergic to microfiber materials/ blankets/sheets!!   Amlodipine Besylate    Other reaction(s): SOB   Avapro [irbesartan] Other (See Comments)   Headaches   Glucophage [metformin Hydrochloride] Other (See Comments)   "increases creatinine" and diarrhea   Lisinopril    Headache   Losartan Other (See Comments)   Headaches   Lumigan [bimatoprost] Other (See Comments)   Eye burning and irritation   Morphine And Related Other (See Comments)   "Hallucinations"   Simvastatin Other (See Comments)   Body aches   Trulicity [dulaglutide] Other (See Comments)   Severe mood swings        Medication List     STOP taking these medications    hydrALAZINE 25 MG tablet Commonly known as: APRESOLINE   isosorbide mononitrate 30 MG 24 hr tablet Commonly known as: IMDUR   metolazone 5 MG tablet Commonly known as: ZAROXOLYN       TAKE these medications    Accu-Chek  Aviva Plus test strip Generic drug: glucose blood 1 each by Other route daily.   acetaminophen 650 MG CR tablet Commonly known as: TYLENOL Take 1,300 mg by mouth every evening.   apixaban 2.5 MG Tabs tablet Commonly known as: Eliquis Take 1 tablet (2.5 mg total) by mouth 2 (two) times daily. Start taking on: June 22, 2022 What changed: These instructions start on June 22, 2022. If you are unsure what to do until then, ask your doctor or other care provider.   BD Pen Needle Nano U/F 32G X 4 MM Misc Generic drug: Insulin Pen Needle See admin instructions.   carvedilol 12.5 MG tablet Commonly known as: COREG Take 1 tablet (12.5 mg total) by mouth 2 (two) times daily with a meal. What changed: how much to take   cycloSPORINE 0.05 % ophthalmic emulsion Commonly known as: RESTASIS Place 1 drop into both eyes 2 (two) times daily as needed (eye dryness).   dicyclomine 10 MG capsule Commonly known as: BENTYL Take 1 capsule (10 mg total) by mouth every 8 (eight) hours as needed for spasms.   febuxostat 40 MG tablet Commonly known as: ULORIC Take 1 tablet (40 mg total) by mouth every Monday, Wednesday, and Friday.   ferrous sulfate 325 (65 FE) MG EC tablet Take 325 mg by mouth daily with breakfast.   gabapentin 300 MG capsule Commonly known as: NEURONTIN Take 1 capsule (300 mg total) by mouth at bedtime. What changed:  medication strength how much to take when to take this   levothyroxine 75 MCG tablet Commonly known as: SYNTHROID Take 75 mcg by mouth at bedtime.   lidocaine 5 % Commonly known as: LIDODERM Place 1-3 patches onto the skin See admin instructions. Apply 1-3  patches transdermally every 24 hours as needed for pain and remove & discard patches within 12 hours or as directed by MD   montelukast 10 MG tablet Commonly known as: SINGULAIR Take 1 tablet (10 mg total) by mouth at bedtime.   multivitamin with minerals Tabs tablet Take 1 tablet by mouth  daily. Start taking on: June 20, 2022   nystatin powder Commonly known as: Nyamyc APPLY TOPICALLY TO LEFT ABDOMINAL FOLD AND GROIN TWICE A DAY What changed:  how much to take how to take this when to take this reasons to take this additional instructions   ondansetron 4 MG disintegrating tablet Commonly known as: ZOFRAN-ODT Take 1 tablet (4 mg total) by mouth every 8 (eight) hours as needed for nausea or vomiting.   pantoprazole 40 MG tablet Commonly known as: PROTONIX Take 40 mg by mouth daily.   pentoxifylline 400 MG CR tablet Commonly known as: TRENTAL Take 400 mg by mouth daily. With a meal   rOPINIRole 0.5 MG tablet Commonly known as: REQUIP Take 0.5 mg by mouth at bedtime.   rosuvastatin 5 MG tablet Commonly known as: CRESTOR Take 1 tablet (5 mg total) by mouth daily.   sennosides-docusate sodium 8.6-50 MG tablet Commonly known as: SENOKOT-S Take 1 tablet by mouth daily.   SF 5000 Plus 1.1 % Crea dental cream Generic drug: sodium fluoride Take by mouth as directed.   Simbrinza 1-0.2 % Susp Generic drug: Brinzolamide-Brimonidine Place 1 drop into the right eye 2 (two) times daily.   tamsulosin 0.4 MG Caps capsule Commonly known as: FLOMAX Take 1 capsule (0.4 mg total) by mouth daily. Start taking on: June 20, 2022   torsemide 20 MG tablet Commonly known as: DEMADEX Take 2 tablets (40 mg total) by mouth daily.   traMADol 50 MG tablet Commonly known as: ULTRAM Take 1 tablet (50 mg total) by mouth 2 (two) times daily.   Tyler Aas FlexTouch 100 UNIT/ML FlexTouch Pen Generic drug: insulin degludec Inject 8-10 Units into the skin every evening.   Vitamin D3 25 MCG (1000 UT) Caps Take 1,000 Units by mouth daily.        Contact information for follow-up providers     [facility] Follow up.   Specialty: Home Health Services Why: The home health agency will contact you for the first home visit. Contact information: [address] [place] [address] [phone]         Ardis Hughs, MD Follow up in 1 week(s).   Specialty: Urology Why: Voiding trial Contact information: [place] [address] [phone]         Corliss Parish, MD Follow up in 2 week(s).   Specialty: Nephrology Why: For hospital follow-up Contact information: [place] [address] [phone]         Clarene Essex, MD. Schedule an appointment as soon as possible for a visit.   Specialty: Gastroenterology Why: As needed Contact information: [address] [place] [address] [phone]              Contact information for after-discharge care     [facility] .   Service: Skilled Nursing Contact information: [address] [place] [phone]                    Discharge Exam: BP (!) 153/64 (BP Location: Left Arm)  Pulse 68   Temp 97.9 F (36.6 C) (Oral)   Resp 18   Ht _0  (1.473 m)   Wt 95.2 kg   SpO2 94%   BMI 43.86 kg/m   General exam: Appears calm and comfortable Respiratory system: Clear to auscultation. Respiratory effort normal. Cardiovascular system: S1 & S2 heard, RRR. 2/6 systolic murmur Gastrointestinal system: Abdomen is mildly distended, soft and tender in right quadrant. Reducible ventral hernia palpated. Normal bowel sounds heard. Central nervous system: Alert and oriented. No focal neurological deficits. Musculoskeletal: No edema. No calf tenderness Skin: No cyanosis. No rashes Psychiatry: Judgement and insight appear normal. Mood & affect appropriate.   Condition at discharge: stable  The results of significant diagnostics from this hospitalization (including imaging, microbiology, ancillary and laboratory) are listed below for reference.   Imaging Studies: DG Abd Portable 1V  Result Date: 06/18/2022 CLINICAL  DATA:  Abdominal distension and pain. EXAM: PORTABLE ABDOMEN - 1 VIEW COMPARISON:  Abdomen 01/28/2020.  CT abdomen pelvis 09/11/2021 FINDINGS: Normal bowel gas pattern. No obstruction or ileus. Contrast in the transverse and left colon from prior contrast study. Surgical clips right upper quadrant. Left hip replacement. AICD in the right ventricle. Left pleural effusion. IMPRESSION: Normal bowel gas pattern. Contrast in the transverse and left colon. Electronically Signed   By: Franchot Gallo M.D.   On: 06/18/2022 14:04   VAS [place] UPPER EXTREMITY VENOUS DUPLEX  Result Date: 06/14/2022 UPPER VENOUS STUDY  Patient Name:  Tricia Gonzales  Date of Exam:   06/14/2022 Medical Rec #: [id number]      Accession #:    [id number] Date of Birth: 14-Mar-1941      Patient Gender: F Patient Age:   81 years Exam Location:  [facility] Procedure:      VAS [place] UPPER EXTREMITY VENOUS DUPLEX Referring Phys: Deno Etienne [facility] --------------------------------------------------------------------------------  Indications: Swelling Risk Factors: None identified. Limitations: Poor ultrasound/tissue interface and orthopaedic appliance. Comparison Study: No prior studies. Performing Technologist: Oliver Hum RVT  Examination Guidelines: A complete evaluation includes B-mode imaging, spectral Doppler, color Doppler, and power Doppler as needed of all accessible portions of each vessel. Bilateral testing is considered an integral part of a complete examination. Limited examinations for reoccurring indications may be performed as noted.  Right Findings: +----------+------------+---------+-----------+----------+-------+ RIGHT     CompressiblePhasicitySpontaneousPropertiesSummary +----------+------------+---------+-----------+----------+-------+ IJV           Full       Yes       Yes                      +----------+------------+---------+-----------+----------+-------+ Subclavian    Full       Yes       Yes                       +----------+------------+---------+-----------+----------+-------+ Axillary      Full       Yes       Yes                      +----------+------------+---------+-----------+----------+-------+ Brachial      Full       Yes       Yes                      +----------+------------+---------+-----------+----------+-------+ Radial        Full                                          +----------+------------+---------+-----------+----------+-------+  Ulnar         Full                                          +----------+------------+---------+-----------+----------+-------+ Cephalic      Full                                          +----------+------------+---------+-----------+----------+-------+ Basilic       Full                                          +----------+------------+---------+-----------+----------+-------+  Left Findings: +----------+------------+---------+-----------+----------+-------+ LEFT      CompressiblePhasicitySpontaneousPropertiesSummary +----------+------------+---------+-----------+----------+-------+ IJV           Full       Yes       Yes                      +----------+------------+---------+-----------+----------+-------+ Subclavian    Full       Yes       Yes                      +----------+------------+---------+-----------+----------+-------+ Axillary      Full       Yes       Yes                      +----------+------------+---------+-----------+----------+-------+ Brachial      Full       Yes       Yes                      +----------+------------+---------+-----------+----------+-------+ Radial        Full                                          +----------+------------+---------+-----------+----------+-------+ Ulnar         Full                                          +----------+------------+---------+-----------+----------+-------+ Cephalic      Full                                           +----------+------------+---------+-----------+----------+-------+ Basilic       Full                                          +----------+------------+---------+-----------+----------+-------+  Summary:  Right: No evidence of deep vein thrombosis in the upper extremity. No evidence of superficial vein thrombosis in the upper extremity.  Left: No evidence of deep vein thrombosis in the upper extremity. No evidence of superficial vein thrombosis in the upper extremity.  *See table(s) above for measurements and observations.  Diagnosing physician: Deitra Mayo  MD Electronically signed by Deitra Mayo MD on 06/14/2022 at 6:23:42 PM.    Final    DG Wrist 2 Views Left  Result Date: 06/14/2022 CLINICAL DATA:  Wrist pain EXAM: LEFT WRIST - 2 VIEW COMPARISON:  None Available. FINDINGS: There is radiocarpal and TFCC chondrocalcinosis. There is severe base of thumb osteoarthritis. There is erosive arthritis at the small finger PIP joint, see separately dictated hand radiograph. There is no evidence of acute fracture. There is mild radiocarpal degenerative change. There is generalized soft tissue swelling. Heterotopic ossification in the anteromedial soft tissues of the forearm. IMPRESSION: Severe base of thumb osteoarthritis. Mild radiocarpal degenerative change with chondrocalcinosis, a finding most commonly seen in osteoarthritis or CPPD arthropathy. No evidence of acute fracture.  Generalized soft tissue swelling. Electronically Signed   By: Maurine Simmering M.D.   On: 06/14/2022 12:06   DG Hand Complete Left  Result Date: 06/14/2022 CLINICAL DATA:  Left hand pain EXAM: LEFT HAND - COMPLETE 3+ VIEW COMPARISON:  Left hand radiograph 11/13/2021 FINDINGS: There is severe first carpometacarpal joint osteoarthritis with bulky osteophyte formation, radial subluxation, and bony remodeling of the trapezium, similar to prior. Moderate triscaphe degenerative change. There is severe joint  space narrowing and central erosive change at the index finger distal interphalangeal joint and small finger proximal phalangeal joint. There is moderate osteoarthritis of the other interphalangeal joints, prominent at the middle, ring, and small finger distal interphalangeal joints. There are angulation deformities of the index, middle, and small fingers. There is generalized soft tissue swelling of the hand. No periostitis. 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 Degree of arthritis is overall similar to prior exam in May 2023. There is no evidence of acute fracture. 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 Moderate osteoarthritis of the other interphalangeal joints. Degree of arthritis is overall similar to prior exam in May 2023. 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 Diffuse soft tissue swelling of the hand. No evidence of acute fracture. Electronically Signed   By: Maurine Simmering M.D.   On: 06/14/2022 08:40   US RENAL  Result Date: 06/10/2022 CLINICAL DATA:  Acute kidney injury. EXAM: RENAL / URINARY TRACT ULTRASOUND COMPLETE COMPARISON:  July 17, 2010. FINDINGS: Right Kidney: Renal measurements: 9.6 x 4.9 x 4.5 cm = volume: 110 mL. Cortical thinning is noted. Multiple cysts are noted, the largest measuring 4.3 cm. Echogenicity within normal limits. No mass or hydronephrosis visualized. Left Kidney: Renal measurements: 10.1 x 5.3 x 4.6 cm = volume: 128 mL. Cortical thinning is noted. 1.2 cm cyst is noted. Echogenicity within normal limits. No mass or hydronephrosis visualized. Bladder: Appears normal for degree of bladder distention. Other: None IMPRESSION: Bilateral renal cortical thinning is noted. Bilateral renal cysts are noted. No hydronephrosis or renal obstruction is noted. Electronically Signed   By:  Marijo Conception M.D.   On: 06/10/2022 14:42   DG Chest Port 1 View  Result Date: 06/09/2022 CLINICAL DATA:  81 year old female with shortness of breath. EXAM: PORTABLE CHEST 1 VIEW COMPARISON:  Portable chest 06/07/2022 and earlier. FINDINGS: Portable AP semi upright view at 0653 hours. Stable lung volumes. Stable cardiac size and mediastinal contours. Stable left chest pacemaker. Ongoing left lung base hypo ventilation and patchy perihilar opacity. This has not significantly changed since 06/05/2022. No pneumothorax or new pulmonary opacity. Paucity of bowel gas. Cholecystectomy clips.  Stable visualized osseous structures. IMPRESSION: 1. No significant change since 06/05/2022: perihilar opacity, left lung base hypoventilation in the setting of cardiomegaly. 2. No new cardiopulmonary abnormality. Electronically Signed   By: Genevie Ann M.D.   On: 06/09/2022 07:47   DG Chest Port 1 View  Result Date: 06/07/2022 CLINICAL DATA:  81 year old female with shortness of breath. EXAM: PORTABLE CHEST 1 VIEW COMPARISON:  Portable chest 06/05/2022 and earlier. FINDINGS: Portable AP semi upright view at 0621 hours. The patient is more rotated to the left. Stable cardiomegaly and mediastinal contours. Stable left chest cardiac pacemaker. Left lung base hypo ventilation is stable. No pneumothorax or pulmonary edema. Probable small bilateral pleural effusions. Bilateral perihilar opacity persists, more apparent on the left but not significantly changed from 2 days ago. Visualized tracheal air column is within normal limits. No acute osseous abnormality identified. IMPRESSION: 1. Bilateral perihilar opacity not significantly changed from 2 days ago. This is nonspecific, but more suspicious for perihilar edema or infection than atelectasis. 2. Otherwise stable Cardiomegaly with probable small bilateral pleural effusions. Electronically Signed   By: Genevie Ann M.D.   On: 06/07/2022 07:01   DG Chest Port 1 View  Result Date:  06/05/2022 CLINICAL DATA:  Cough and rectal bleeding EXAM: PORTABLE CHEST 1 VIEW COMPARISON:  Radiographs 05/22/2022 FINDINGS: Similar cardiomegaly. Aortic atherosclerotic calcification. Left chest wall dual-chamber pacemaker. Similar chronic bronchitic changes. Pulmonary vascular congestion. Left-greater-than-right basilar atelectasis. Question small left pleural effusion. No pneumothorax. No definite acute osseous abnormality. IMPRESSION: Retrocardiac atelectasis/consolidation. Pneumonia is difficult to exclude. Cardiomegaly and pulmonary vascular congestion. Electronically Signed   By: Placido Sou M.D.   On: 06/05/2022 20:07   DG CHEST PORT 1 VIEW  Result Date: 05/22/2022 CLINICAL DATA:  Shortness of breath EXAM: PORTABLE CHEST 1 VIEW COMPARISON:  Radiograph 08/15/2020 FINDINGS: Unchanged enlarged cardiac silhouette with dual chamber pacemaker leads. There is no focal airspace disease. Central pulmonary vascular congestion. No new airspace disease. Trace pleural effusions. No evidence of pneumothorax. IMPRESSION: Cardiomegaly with pulmonary vascular congestion and trace pleural effusions. Electronically Signed   By: Maurine Simmering M.D.   On: 05/22/2022 10:30    Microbiology: Results for orders placed or performed during the hospital encounter of 06/05/22  Resp panel by RT-PCR (RSV, Flu A&B, Covid) Anterior Nasal Swab     Status: Abnormal   Collection Time: 06/05/22  7:43 PM   Specimen: Anterior Nasal Swab  Result Value Ref Range Status   SARS Coronavirus 2 by RT PCR POSITIVE (A) NEGATIVE Final    Comment: (NOTE) SARS-CoV-2 target nucleic acids are DETECTED.  The SARS-CoV-2 RNA is generally detectable in upper respiratory specimens during the acute phase of infection. Positive results are indicative of the presence of the identified virus, but do not rule out bacterial infection or co-infection with other pathogens not detected by the test. Clinical correlation with patient history and other  diagnostic information is necessary to determine patient infection status. The expected result is Negative.  Fact Sheet for Patients: EntrepreneurPulse.com.au  Fact Sheet for Healthcare Providers: IncredibleEmployment.be  This test is not yet approved or cleared by the [place] FDA and  has been authorized for detection and/or diagnosis of SARS-CoV-2 by FDA under an Emergency Use Authorization (EUA).  This EUA will remain in effect (meaning this test can be used) for the duration of  the COVID-19 declaration under Section 564(b)(1) of the A ct, 21 U.S.C. section 360bbb-3(b)(1), unless the authorization is terminated or revoked sooner.     Influenza  A by PCR NEGATIVE NEGATIVE Final   Influenza B by PCR NEGATIVE NEGATIVE Final    Comment: (NOTE) The Xpert Xpress SARS-CoV-2/FLU/RSV plus assay is intended as an aid in the diagnosis of influenza from Nasopharyngeal swab specimens and should not be used as a sole basis for treatment. Nasal washings and aspirates are unacceptable for Xpert Xpress SARS-CoV-2/FLU/RSV testing.  Fact Sheet for Patients: EntrepreneurPulse.com.au  Fact Sheet for Healthcare Providers: IncredibleEmployment.be  This test is not yet approved or cleared by the [place] FDA and has been authorized for detection and/or diagnosis of SARS-CoV-2 by FDA under an Emergency Use Authorization (EUA). This EUA will remain in effect (meaning this test can be used) for the duration of the COVID-19 declaration under Section 564(b)(1) of the Act, 21 U.S.C. section 360bbb-3(b)(1), unless the authorization is terminated or revoked.     Resp Syncytial Virus by PCR NEGATIVE NEGATIVE Final    Comment: (NOTE) Fact Sheet for Patients: EntrepreneurPulse.com.au  Fact Sheet for Healthcare Providers: IncredibleEmployment.be  This test is not yet approved or  cleared by the [place] FDA and has been authorized for detection and/or diagnosis of SARS-CoV-2 by FDA under an Emergency Use Authorization (EUA). This EUA will remain in effect (meaning this test can be used) for the duration of the COVID-19 declaration under Section 564(b)(1) of the Act, 21 U.S.C. section 360bbb-3(b)(1), unless the authorization is terminated or revoked.  Performed at [facility], [place] [address] [place],  [address]    *Note: Due to a large number of results and/or encounters for the requested time period, some results have not been displayed. A complete set of results can be found in Results Review.    Labs: CBC: Recent Labs  Lab 06/14/22 0647 06/15/22 0646 06/16/22 0741 06/17/22 0528 06/18/22 0458 06/19/22 0523  WBC 14.6* 7.0 9.9 7.2 5.8  --   NEUTROABS 13.0* 6.6 9.0* 6.1 4.7  --   HGB 7.5* 6.5* 8.8* 8.8* 8.4* 9.0*  HCT 22.7* 19.0* 26.0* 27.8* 25.7* 28.2*  MCV 101.8* 103.8* 95.6 98.9 96.3  --   PLT 228 182 196 199 169  --    Basic Metabolic Panel: Recent Labs  Lab 06/13/22 0509 06/14/22 0647 06/15/22 0646 06/16/22 0741 06/17/22 0528  NA 132* 133* 133* 135 135  K 4.7 5.1 5.3* 5.0 4.5  CL 93* 93* 96* 95* 95*  CO2 _0 GLUCOSE 189* 171* 422* 249* 200*  BUN 152* 152* 157* 153* 152*  CREATININE 4.37* 4.32* 3.99* 3.55* 3.37*  CALCIUM 8.4* 8.7* 8.5* 8.9 8.7*    CBG: Recent Labs  Lab 06/18/22 1629 06/18/22 1711 06/18/22 2100 06/19/22 0829 06/19/22 1142  GLUCAP 103* 97 100* 113* 179*    Discharge time spent: 35 minutes.  Signed: Cordelia Poche, MD [facility] 06/19/2022

## 2022-06-18 NOTE — Progress Notes (Signed)
PT Cancellation Note  Patient Details Name: CARLEAN CROWL MRN: [id number] DOB: 1941/02/27   Cancelled Treatment:    Reason Eval/Treat Not Completed: Family request hold PT at this time, nursing staff in room assisting patient. Family reports issues with constipation.  Will follow up as schedule allows.   Ellouise Newer 06/18/2022, 3:52 PM

## 2022-06-18 NOTE — Progress Notes (Signed)
PROGRESS NOTE    Tricia Gonzales  LJQ:[id number] DOB: 12/12/1940 DOA: 06/05/2022 PCP: Carol Ada, MD   Brief Narrative: No notes on file   Assessment and Plan:  Acute upper GI bleed [facility] gastroenterology consulted. Upper endoscopy performed on 12/26 and was significant for abnormal esophageal motility, erosive gastropathy. GI recommendation to continue PPI daily and restart anticoagulation "in a few days if absolutely needed." Recommendations to follow-up with GI outpatient as needed.  Acute blood loss anemia Secondary to GI bleeding. Hemoglobin as low as 5.6 this admission. Patient required a total of 5 units of PRBC. Most recent hemoglobin of 8.4. Stable.  AKI on CKD stage IV Concern for possible ischemic ATN in setting of acute blood loss anemia and low-normal blood pressure and/or cardiorenal syndrome. Nephrology consulted. Patient with desire for no hemodialysis. Nephrology with recommendations for outpatient nephrology follow-up. If worsening renal function, recommend referral to hospice.  Acute urinary retention Recurrent. Patient started on Flomax. Associated constipation. -Continue Flomax -Bladder scan q6 hours  Left wrist arthritis Possible gout. Orthopedic surgery consulted and recommended conservative management with ice, antibiotics and solu-medrol x1. Symptoms resolved. Uric acid obtained and is 9.3. Once symptoms are resolved and kidney function stabilized, can consider starting allopurinol, dosed renally.  COVID-19 infection Patient completed Remdesivir. No steroids given for treatment. Patient is out of window for isolation.  Pneumonia Secondary to COVID-19 infection with concern for superimposed bacterial infection. Patient treated with Remdesivir and Ceftriaxone/Azithromycin. Antibiotic course completed.  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 Patient diuresed while inpatient. Cardiology consulted and plan to upgrade  patient's PPM to BiV device. Patient to follow-up with cardiology as an outpatient. Recommendation for BNP and BMP one week from discharge.  Paroxysmal atrial fibrillation Eliquis held secondary to GI bleeding -Resume Eliquis on 06/22/2022  Pulmonary hypertension Moderate-severe mitral regurgitation Severe regurgitation Patient follows with cardiology as an outpatient.  Primary hypertension -Continue Coreg  Hyperlipidemia -Continue Crestor  Hypothyroidism -Continue levothyroxine  Diabetes mellitus, type 2 -Continue SSI  Constipation -Abdominal x-ray to rule out obstruction -Dulcolax suppository  Morbid obesity Estimated body mass index is 43.86 kg/m as calculated from the following:   Height as of this encounter: _0  (1.473 m).   Weight as of this encounter: 95.2 kg.   DVT prophylaxis: SCDs Code Status:   Code Status: DNR Family Communication: None at bedside Disposition Plan: Discharge to SNF in AM if urinary retention resolved   Consultants:  Nephrology Orthopedic surgery  Procedures:  Upper endoscopy  Antimicrobials: Remdesivir Ceftriaxone Azithromycin    Subjective: Patient reports significant abdominal pain. Concerned she is retaining urine again. She is also concerned about not having a bowel movement.  Objective: BP 127/75 (BP Location: Left Arm)   Pulse 87   Temp 97.7 F (36.5 C) (Axillary) Comment: pt eating ice  Resp 19   Ht _1  (1.473 m)   Wt 95.2 kg   SpO2 91%   BMI 43.86 kg/m   Examination:  General exam: Appears calm and comfortable Respiratory system: Clear to auscultation. Respiratory effort normal. Cardiovascular system: S1 & S2 heard, RRR. 2/6 systolic murmur Gastrointestinal system: Abdomen is mildly distended, soft and tender in right quadrant. Reducible ventral hernia palpated. Normal bowel sounds heard. Central nervous system: Alert and oriented. No focal neurological deficits. Musculoskeletal: No edema. No calf  tenderness Skin: No cyanosis. No rashes Psychiatry: Judgement and insight appear normal. Mood & affect appropriate.    Data Reviewed: I have personally reviewed  following labs and imaging studies  CBC Lab Results  Component Value Date   WBC 5.8 06/18/2022   RBC 2.67 (L) 06/18/2022   HGB 8.4 (L) 06/18/2022   HCT 25.7 (L) 06/18/2022   MCV 96.3 06/18/2022   MCH 31.5 06/18/2022   PLT 169 06/18/2022   MCHC 32.7 06/18/2022   RDW 16.1 (H) 06/18/2022   LYMPHSABS 0.4 (L) 06/18/2022   MONOABS 0.4 06/18/2022   EOSABS 0.3 06/18/2022   BASOSABS 0.0 01/74/9449     Last metabolic panel Lab Results  Component Value Date   NA 135 06/17/2022   K 4.5 06/17/2022   CL 95 (L) 06/17/2022   CO2 31 06/17/2022   BUN 152 (H) 06/17/2022   CREATININE 3.37 (H) 06/17/2022   GLUCOSE 200 (H) 06/17/2022   GFRNONAA 13 (L) 06/17/2022   GFRAA 24 (L) 08/05/2020   CALCIUM 8.7 (L) 06/17/2022   PHOS 4.5 06/07/2022   PROT 5.2 (L) 06/06/2022   ALBUMIN 2.4 (L) 06/07/2022   LABGLOB 2.4 07/05/2018   AGRATIO 1.3 07/05/2018   BILITOT 1.5 (H) 06/06/2022   ALKPHOS 63 06/06/2022   AST 24 06/06/2022   ALT 12 06/06/2022   ANIONGAP 9 06/17/2022    GFR: Estimated Creatinine Clearance: 13.2 mL/min (A) (by C-G formula based on SCr of 3.37 mg/dL (H)).  No results found for this or any previous visit (from the past 240 hour(s)).    Radiology Studies: DG Abd Portable 1V  Result Date: 06/18/2022 CLINICAL DATA:  Abdominal distension and pain. EXAM: PORTABLE ABDOMEN - 1 VIEW COMPARISON:  Abdomen 01/28/2020.  CT abdomen pelvis 09/11/2021 FINDINGS: Normal bowel gas pattern. No obstruction or ileus. Contrast in the transverse and left colon from prior contrast study. Surgical clips right upper quadrant. Left hip replacement. AICD in the right ventricle. Left pleural effusion. IMPRESSION: Normal bowel gas pattern. Contrast in the transverse and left colon. Electronically Signed   By: Franchot Gallo M.D.   On: 06/18/2022  14:04      LOS: 13 days    Cordelia Poche, MD [facility] 06/18/2022, 5:02 PM   If 7PM-7AM, please contact night-coverage [email or web address]

## 2022-06-18 NOTE — Plan of Care (Signed)

## 2022-06-19 ENCOUNTER — Ambulatory Visit: Payer: Medicare Other | Admitting: Physician Assistant

## 2022-06-19 DIAGNOSIS — K922 Gastrointestinal hemorrhage, unspecified: Secondary | ICD-10-CM | POA: Diagnosis not present

## 2022-06-19 LAB — GLUCOSE, CAPILLARY
Glucose-Capillary: 113 mg/dL — ABNORMAL HIGH (ref 70–99)
Glucose-Capillary: 179 mg/dL — ABNORMAL HIGH (ref 70–99)
Glucose-Capillary: 135 mg/dL — ABNORMAL HIGH (ref 70–99)

## 2022-06-19 LAB — HEMOGLOBIN AND HEMATOCRIT, BLOOD
HCT: 28.2 % — ABNORMAL LOW (ref 36.0–46.0)
Hemoglobin: 9 g/dL — ABNORMAL LOW (ref 12.0–15.0)

## 2022-06-19 MED ORDER — CARVEDILOL 12.5 MG PO TABS: 12.5000 mg | ORAL_TABLET | Freq: Two times a day (BID) | ORAL | Status: AC

## 2022-06-19 MED ORDER — SENNOSIDES-DOCUSATE SODIUM 8.6-50 MG PO TABS: 2.0000 | ORAL_TABLET | Freq: Every evening | ORAL | Status: DC | PRN

## 2022-06-19 MED ORDER — GABAPENTIN 300 MG PO CAPS: 300.0000 mg | ORAL_CAPSULE | Freq: Every day | ORAL | Status: AC

## 2022-06-19 MED ORDER — APIXABAN 2.5 MG PO TABS: 2.5000 mg | ORAL_TABLET | Freq: Two times a day (BID) | ORAL | Status: AC

## 2022-06-19 MED ORDER — ADULT MULTIVITAMIN W/MINERALS CH: 1.0000 | ORAL_TABLET | Freq: Every day | ORAL | Status: AC

## 2022-06-19 MED ORDER — TAMSULOSIN HCL 0.4 MG PO CAPS: 0.4000 mg | ORAL_CAPSULE | Freq: Every day | ORAL | Status: AC

## 2022-06-19 MED ORDER — TRAMADOL HCL 50 MG PO TABS: 50.0000 mg | ORAL_TABLET | Freq: Two times a day (BID) | ORAL | 0 refills | Status: AC

## 2022-06-19 NOTE — Progress Notes (Signed)
Occupational Therapy Treatment Patient Details Name: Tricia Gonzales MRN: [id number] DOB: 02/19/1941 Today's Date: 06/19/2022   History of present illness 81 yo female admitted with fever, general malaise, and dark stools; Covid +; GI on board; with medical history significant for chronic biventricular systolic heart failure, paroxysmal atrial fibrillation complicated by sick sinus syndrome status post pacemaker placement, chronically anticoagulated on Eliquis, chronic hypoxic respiratory failure on 2 L continuous nasal cannula, type 2 diabetes mellitus, CKD 4, anemia   OT comments  Patient received in bed and asking to use BSC. Patient performed BSC transfers x2, once from EOB with RW and once from recliner with HHA. Patient continues to require min assist but is progressing towards min guard. Patient with decreased complaints of LUE hand pain. Acute OT to continue to follow to continue to address functional transfers and self care.    Recommendations for follow up therapy are one component of a multi-disciplinary discharge planning process, led by the attending physician.  Recommendations may be updated based on patient status, additional functional criteria and insurance authorization.    Follow Up Recommendations  No OT follow up (pt declining - would benefit from [facility])     Assistance Recommended at Discharge Intermittent Supervision/Assistance  Patient can return home with the following  A little help with walking and/or transfers;A lot of help with bathing/dressing/bathroom;Assistance with cooking/housework;Assist for transportation;Help with stairs or ramp for entrance   Equipment Recommendations  BSC/3in1    Recommendations for Other Services      Precautions / Restrictions Precautions Precautions: Fall;Other (comment) Precaution Comments: Watch SpO2 (does not wear O2 baseline) COVID (off precautions now) Restrictions Weight Bearing Restrictions: No       Mobility Bed  Mobility Overal bed mobility: Needs Assistance Bed Mobility: Supine to Sit     Supine to sit: Min assist, HOB elevated     General bed mobility comments: assistance to raise trunk    Transfers Overall transfer level: Needs assistance Equipment used: Rolling walker (2 wheels), None Transfers: Sit to/from Stand Sit to Stand: Min assist, Min guard Stand pivot transfers: Min assist         General transfer comment: performed transfers to [facility] x2, once from EOB and once from recliner. RW used from bed and HHA from recliner     Balance Overall balance assessment: Needs assistance Sitting-balance support: Feet supported Sitting balance-Leahy Scale: Good Sitting balance - Comments: able to maintain sitting balance on EOB   Standing balance support: Single extremity supported, During functional activity Standing balance-Leahy Scale: Poor Standing balance comment: UE support and min guard for static standing                           ADL either performed or assessed with clinical judgement   ADL Overall ADL's : Needs assistance/impaired     Grooming: Wash/dry hands;Wash/dry face;Set up;Sitting Grooming Details (indicate cue type and reason): in recliner                 Toilet Transfer: Minimal assistance;Stand-pivot;BSC/3in1 Toilet Transfer Details (indicate cue type and reason): with and without Rw wtih min assist for balance Toileting- Clothing Manipulation and Hygiene: Maximal assistance;Sit to/from stand Toileting - Clothing Manipulation Details (indicate cue type and reason): performed standing       General ADL Comments: decreased LUE pain but continues to require max assist for toilet hygiene    Extremity/Trunk Assessment  Vision       Perception     Praxis      Cognition Arousal/Alertness: Awake/alert Behavior During Therapy: WFL for tasks assessed/performed Overall Cognitive Status: Within Functional Limits for tasks  assessed                                          Exercises      Shoulder Instructions       General Comments      Pertinent Vitals/ Pain       Pain Assessment Pain Assessment: Faces Faces Pain Scale: Hurts little more Pain Location: abdomen, left hand Pain Descriptors / Indicators: Cramping, Discomfort, Grimacing, Guarding Pain Intervention(s): Limited activity within patient's tolerance, Monitored during session, Repositioned  Home Living                                          Prior Functioning/Environment              Frequency  Min 2X/week        Progress Toward Goals  OT Goals(current goals can now be found in the care plan section)  Progress towards OT goals: Progressing toward goals  Acute Rehab OT Goals Patient Stated Goal: get better OT Goal Formulation: With patient Time For Goal Achievement: 06/22/22 Potential to Achieve Goals: Good ADL Goals Pt Will Perform Grooming: with modified independence;sitting Pt Will Perform Upper Body Dressing: with modified independence;sitting Pt Will Perform Lower Body Dressing: with supervision;sit to/from stand Pt Will Transfer to Toilet: with supervision;ambulating;regular height toilet Pt Will Perform Toileting - Clothing Manipulation and hygiene: with supervision;sit to/from stand Additional ADL Goal #1: Pt will perform bed mobility at Mod I level prior to engaging in ADL Additional ADL Goal #2: Pt will recall at least 3 energy conservation strategies to use during ADL with no cues  Plan Discharge plan remains appropriate    Co-evaluation                 AM-PAC OT "6 Clicks" Daily Activity     Outcome Measure   Help from another person eating meals?: None Help from another person taking care of personal grooming?: A Little Help from another person toileting, which includes using toliet, bedpan, or urinal?: A Lot Help from another person bathing (including  washing, rinsing, drying)?: A Lot Help from another person to put on and taking off regular upper body clothing?: A Little Help from another person to put on and taking off regular lower body clothing?: A Lot 6 Click Score: 16    End of Session Equipment Utilized During Treatment: Rolling walker (2 wheels)  OT Visit Diagnosis: Unsteadiness on feet (R26.81);Muscle weakness (generalized) (M62.81);Pain Pain - Right/Left: Left Pain - part of body: Hand   Activity Tolerance Patient tolerated treatment well   Patient Left in chair;with call bell/phone within reach;with chair alarm set   Nurse Communication Mobility status        Time: 8768-1157 OT Time Calculation (min): 42 min  Charges: OT General Charges $OT Visit: 1 Visit OT Treatments $Self Care/Home Management : 38-52 mins  Lodema Hong, [place]  Office [place] 06/19/2022, 1:29 PM

## 2022-06-19 NOTE — TOC Transition Note (Signed)
Transition of Care [facility]) - CM/SW Discharge Note   Patient Details  Name: Tricia Gonzales MRN: [id number] Date of Birth: 1941/03/13  Transition of [facility]) CM/SW Contact:  Benard Halsted, [place] Phone Number: 06/19/2022, 2:55 PM   Clinical Narrative:    Patient will DC to: [place] date: 06/19/22 Family notified: Nira Conn, Daughter Transport by: Corey Harold   Per MD patient ready for DC to [facility]. RN to call report prior to discharge ([phone]). RN, patient, patient's family, and facility notified of DC. Discharge Summary and FL2 sent to facility. DC packet on chart including signed script and DNR. Ambulance transport requested for patient.   CSW will sign off for now as social work intervention is no longer needed. Please consult [place] again if new needs arise.     Final next level of care: Skilled Nursing Facility Barriers to Discharge: Barriers Resolved   Patient Goals and CMS Choice CMS Medicare.gov Compare Post Acute Care list provided to:: Patient Choice offered to / list presented to : Patient  Discharge Placement     Existing PASRR number confirmed : 06/19/22          Patient [facility] bed at: [facility] Patient to be transferred to facility by: [place] Name of family member notified: Daughter Patient and family notified of of transfer: 06/19/22  Discharge Plan and Services Additional resources added to the After Visit Summary for   In-house Referral: Clinical Social Work   Post Acute Care Choice: Home Health                    HH Arranged: PT, Nurse's Aide [facility] Agency: [place] Date [facility] Agency Contacted: 06/08/22   Representative spoke with at [place]: [place] Determinants of Health ([place]) Interventions SDOH Screenings   Food Insecurity: No Food Insecurity (06/06/2022)  Housing: Low Risk  (06/06/2022)  Transportation Needs: No Transportation Needs (06/06/2022)  Utilities: Not At Risk (06/06/2022)  Alcohol  Screen: Low Risk  (05/22/2022)  Financial Resource Strain: Low Risk  (05/22/2022)  Tobacco Use: Medium Risk (06/18/2022)     Readmission Risk Interventions    08/20/2020    1:20 PM  Readmission Risk Prevention Plan  Transportation Screening Complete  PCP or Specialist Appt within 3-5 Days Complete  HRI or [place] Complete  Social Work Consult for [place] Planning/Counseling Complete  Palliative Care Screening Not Applicable  Medication Review Press photographer) Complete

## 2022-06-19 NOTE — Progress Notes (Signed)
Patient discharging to [facility]. Family and patient aware and agreeable to plan of care. RN removed IV's and reviewed discharge plan with patient. Report called to [place] Pu RN at facility. No questions or concerns at this time. [place] to remain in for discharge. Awaiting PTAR for transport.

## 2022-06-19 NOTE — Hospital Course (Signed)
Tricia Gonzales is a 81 y.o. female with a history of chronic systolic heart failure, paroxysmal atrial fibrillation, sick sinus syndrome s/p PPM, chronic respiratory failure with hypoxia on home oxygen, diabetes mellitus type 2, CKD stage IV. Patient presented secondary to fever and was found to have COVID-19 infection with associated pneumonia in addition to evidence of acute blood loss anemia from GI bleeding. Patient managed on Remdesivir, Ceftriaxone and azithromycin for pneumonia and completed course. For GI bleed, GI was consulted and performed an upper endoscopy, revealing evidence of erosive gastropathy. Patient required 5 units of PRBC during hospitalization. Hemoglobin stabilized.

## 2022-06-23 ENCOUNTER — Ambulatory Visit: Payer: Medicare Other | Admitting: Internal Medicine

## 2022-06-24 ENCOUNTER — Ambulatory Visit (HOSPITAL_COMMUNITY): Payer: Medicare Other

## 2022-06-29 ENCOUNTER — Telehealth: Payer: Self-pay | Admitting: Oncology

## 2022-06-29 NOTE — Telephone Encounter (Signed)
Patient daughter called in to cancel patient appointment for tomorrow and next week Thursday. Daughter advised that she had received 4 units of blood when she got discharged from hospital on December 29th, patient feels like that is good enough. Appointments were cancelled and daughter advise that they will call back to reschedule

## 2022-06-30 ENCOUNTER — Inpatient Hospital Stay: Payer: Medicare Other

## 2022-06-30 ENCOUNTER — Inpatient Hospital Stay: Payer: Medicare Other | Admitting: Nurse Practitioner

## 2022-07-01 ENCOUNTER — Ambulatory Visit: Payer: Medicare Other

## 2022-07-01 ENCOUNTER — Ambulatory Visit: Payer: Medicare Other | Admitting: Nurse Practitioner

## 2022-07-01 ENCOUNTER — Other Ambulatory Visit: Payer: Medicare Other

## 2022-07-02 ENCOUNTER — Ambulatory Visit: Payer: Medicare Other | Attending: Physician Assistant | Admitting: Physician Assistant

## 2022-07-02 ENCOUNTER — Encounter: Payer: Self-pay | Admitting: Physician Assistant

## 2022-07-02 VITALS — BP 142/60 | HR 67 | Ht <= 58 in | Wt 191.6 lb

## 2022-07-02 DIAGNOSIS — N184 Chronic kidney disease, stage 4 (severe): Secondary | ICD-10-CM

## 2022-07-02 DIAGNOSIS — D649 Anemia, unspecified: Secondary | ICD-10-CM

## 2022-07-02 DIAGNOSIS — R0602 Shortness of breath: Secondary | ICD-10-CM | POA: Diagnosis not present

## 2022-07-02 DIAGNOSIS — I10 Essential (primary) hypertension: Secondary | ICD-10-CM

## 2022-07-02 DIAGNOSIS — J90 Pleural effusion, not elsewhere classified: Secondary | ICD-10-CM | POA: Diagnosis not present

## 2022-07-02 DIAGNOSIS — R188 Other ascites: Secondary | ICD-10-CM | POA: Diagnosis not present

## 2022-07-02 DIAGNOSIS — I5023 Acute on chronic systolic (congestive) heart failure: Secondary | ICD-10-CM | POA: Diagnosis not present

## 2022-07-02 DIAGNOSIS — Z950 Presence of cardiac pacemaker: Secondary | ICD-10-CM

## 2022-07-02 NOTE — Patient Instructions (Addendum)
Medication Instructions:  Your physician recommends that you continue on your current medications as directed. Please refer to the Current Medication list given to you today.  *If you need a refill on your cardiac medications before your next appointment, please call your pharmacy*  Lab Work: NONE ordered at this time of appointment   If you have labs (blood work) drawn today and your tests are completely normal, you will receive your results only by: [place] (if you have MyChart) OR A paper copy in the mail If you have any lab test that is abnormal or we need to change your treatment, we will call you to review the results.  Testing/Procedures: A abdominal ultrasound was requested by Almyra Deforest, PA-C. This test is performed at [facility] [address] [place] [address], [place] [address]  A chest x-ray takes a picture of the organs and structures inside the chest, including the heart, lungs, and blood vessels. This test can show several things, including, whether the heart is enlarges; whether fluid is building up in the lungs; and whether pacemaker / defibrillator leads are still in place. This test is performed at [facility] [address],  [address]  Follow-Up: At [facility], you and your health needs are our priority.  As part of our continuing mission to provide you with exceptional heart care, we have created designated Provider Care Teams.  These Care Teams include your primary Cardiologist (physician) and Advanced Practice Providers (APPs -  Physician Assistants and Nurse Practitioners) who all work together to provide you with the care you need, when you need it.  We recommend signing up for the patient portal called "MyChart".  Sign up information is provided on this After Visit Summary.  MyChart is used to connect with patients for Virtual Visits (Telemedicine).  Patients are able to view lab/test results, encounter notes, upcoming appointments,  etc.  Non-urgent messages can be sent to your provider as well.   To learn more about what you can do with MyChart, go to [email or web address].    Your next appointment:   2 week(s)  Provider:   App on day Dr. Audie Box is in office      Other Instructions

## 2022-07-02 NOTE — Progress Notes (Signed)
Cardiology Office Note:    Date:  07/04/2022   ID:  Tricia Gonzales, DOB 03-06-1941, MRN [id number]  PCP:  Tricia Gonzales, [place] Providers Cardiologist:  Tricia Field, MD Electrophysiologist:  Tricia Peru, MD     Referring MD: Tricia Ada, MD   Chief Complaint  Patient presents with   Follow-up    Seen for Tricia Gonzales    History of Present Illness:    Tricia Gonzales is a 82 y.o. female with a hx of PAF, chronic systolic heart failure, HTN, HLD, DM2, and CKD stage IV-V.  She has been followed by nephrology service, Dr. Corliss Parish.  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  Patient was previously diagnosed with PAF and underwent DCCV on 07/31/2020.  Limited echocardiogram obtained at the time showed EF 50 to 55%, moderately elevated pulmonary artery systolic pressure, moderate to severe MR.  She has a history of GI bleed and Sjogren's.  Patient was last seen by Tricia Gonzales on 02/06/2022 at which time she was maintaining sinus rhythm and has been compliant with Eliquis 2.5 mg twice a day.  Due to elevated blood pressure, carvedilol was increased to 6.25 mg twice a day.  79-monthfollow-up was recommended.   I saw the patient on 03/17/2022 for evaluation of dyspnea on exertion that has been going on for several month.  She says her symptom has been going on since her cardiac medication was adjusted including increased carvedilol and lowering of Eliquis.  I told the patient that medication adjustment is unlikely to have caused her symptom.  I suspected her shortness of breath was caused by worsening valve issue and recommended echocardiogram.  We did a remote interrogation, she is maintaining sinus rhythm and only had 1 minute of A-fib the month prior.  I asked her to increase torsemide to 20 mg twice a day for 3 days before going down to the previous dose, however she was hesitant to do so given her poor renal function.   Lab work showed her proBNP was 65,000.  Creatinine was 2.16.  Subsequent echocardiogram obtained on 03/20/2022 showed EF 25 to 30%, moderately enlarged RV with RV systolic pressure 416.6mmHg, moderately dilated left and the right atrium, moderate to severe MR was significant artifact and eccentric jet posteriorly directed, may be underestimated, moderate MAC, severe TR. when she returned in October, she was noted to be in paroxysmal atrial tachycardia.  Dr. CSallyanne Kusterwas able to pace her out of atrial tachycardia using her pacemaker.  Her carvedilol was increased for suppression.  She was seen by Dr. TLovena Lewill further increase her carvedilol.  When I saw the patient in November, she was feeling very fatigued after carvedilol was increased.  I discussed the case with her primary cardiologist Dr. 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  She was subsequently admitted to the hospital for further diuresis.  Limited echocardiogram showed EF 30 to 35%, mild LVH, moderately reduced RV systolic function, severely elevated pulmonary artery systolic pressure, moderate to severe MR, severe TR.  Patient's device interrogation showed high RV pacing about 88% of the time.  Again Dr. OAudie Boxsuspected she has pacing induced cardiomyopathy and recommend discussed with the EP at some point to upgrade to biventricular pacemaker.  We also recommended referral to heart failure service, however patient declined.  She was diuresed 7 L with IV Lasix.  Creatinine unfortunately trended up from  2.4 up to 3.35 prior to discharge.  GFR was too low to start on SGLT2 inhibitor.  Patient was ultimately discharged on 40 mg daily of torsemide with addition of metolazone 5 mg every Monday.  Discharge weight was 191 pounds.  Since discharge, patient was seen by heart failure TOC clinic on 06/02/2022, her weight on that day was 186 pounds. ReDs 32%.  It was recommended again she discussed with the EP regarding CRT-P  upgrade.  She had GI symptoms and poor response to torsemide, it was suspected patient likely has right > left sided heart failure.  Since discharge, she has been readmitted to the hospital on 06/05/2022 with worsening malaise, shortness of breath and fever.  When she returned, she was found to be COVID-positive with associated pneumonia, creatinine up to 4.1.  EKG showed atrial fibrillation with right bundle branch block with ST depression in V5 and V6.  He was admitted with GI bleed and prerenal AKI along with COVID infection.  Home torsemide and metolazone were held until AKI resolves.  Patient has declined hemodialysis as a treatment for renal disease.  Nephrology service recommended palliative care/hospice referral if renal function continues to decline.  She was treated with remdesivir, ceftriaxone and azithromycin for pneumonia.  For GI bleed, GI was consulted and they performed upper endoscopy which revealed erosive gastropathy.  She has required 5 units of packed red blood cells during the hospitalization.  She was discharged on 12/29 and recommended to resume Eliquis on 06/22/2022.  Most recent creatinine was 3.37 upon discharge.  It appears torsemide was resumed at the time of discharge, however metolazone was discontinued.   Patient presents today for follow-up along with her daughter.  She is still very fatigued and has been having worsening dyspnea.  She denies any chest pain.  She feels her shortness of breath is getting worse since discharge.  On physical exam, she has 2+ pitting edema in the lower extremity.  She complained of abdominal distention, she likely has small amount of ascites.  I recommend abdominal ultrasound.  On physical exam, she has markedly diminished breath sounds in bilateral bases of the lung, I recommend a chest x-ray.  She is still on 40 mg daily of torsemide.  If renal function is stable, I would like to give her a single dose of metolazone.  I have called [facility] the [place] where she is staying, per verbal report, white blood cell count 4.6, hemoglobin 7.7, red blood cell count 2.51, hematocrit 24.0, BMP 1217, creatinine 1.93, BUN 27.9, potassium 3.9, sodium 143.  I instructed the facility to give her a single dose of 5 mg metolazone tomorrow morning 30 minutes prior to her torsemide.  She will need to follow-up in 2 to 3 weeks.  I do think she likely needs a condition for palliative care consultation, I briefly discussed this with her and her family.  She is still very much open to treatment.  I did not make a referral to palliative medicine today.  Will reassess in a few weeks.    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   Anxiety    Arthritis    Atrial fibrillation ([place])    Avascular necrosis (HCC) hip left   leg pain also   Chronic back pain    Chronic kidney disease (CKD), stage III (moderate) (HCC)    Depression    Diverticulosis    Early  cataracts, bilateral    Esophageal stricture    Gastritis    GAVE (gastric antral vascular ectasia)    GERD (gastroesophageal reflux disease)    Glaucoma    Heart murmur     " some doctors say that I have one some say that I don"t "   Hiatal hernia    History of blood transfusion    "@ least w/1st knee OR"   History of gout    History of kidney stones    Hyperlipidemia    Hypertension    Intestinal obstruction ([place])    Kidney stones    Obesity    Osteoarthritis    Osteoporosis    Pericarditis 2016   Presence of permanent cardiac pacemaker    Sjogren's disease ([place])    Thyroid disease    Type II diabetes mellitus ([place])    Vitamin B12 deficiency     Past Surgical History:  Procedure Laterality Date   ABDOMINAL HYSTERECTOMY     complete   CARDIOVERSION N/A 07/31/2020   Procedure: CARDIOVERSION;  Surgeon: Werner Lean, MD;  Location: [facility];  Service: Cardiovascular;  Laterality: N/A;    CHOLECYSTECTOMY OPEN     COLECTOMY     for rectovaginal fistula   COLONOSCOPY  01/07/2012   Procedure: COLONOSCOPY;  Surgeon: Lafayette Dragon, MD;  Location: [facility];  Service: Endoscopy;  Laterality: N/A;   EP IMPLANTABLE DEVICE N/A 06/18/2015   Procedure: Pacemaker Implant;  Surgeon: Evans Lance, MD;  Location: [facility];  Service: Cardiovascular;  Laterality: N/A;   ESOPHAGEAL DILATION  08/17/2020   Procedure: ESOPHAGEAL DILATION;  Surgeon: Lavena Bullion, DO;  Location: [facility];  Service: Gastroenterology;;   ESOPHAGOGASTRODUODENOSCOPY N/A 11/24/2018   Procedure: ESOPHAGOGASTRODUODENOSCOPY (EGD);  Surgeon: Yetta Flock, MD;  Location: [facility];  Service: Gastroenterology;  Laterality: N/A;   ESOPHAGOGASTRODUODENOSCOPY (EGD) WITH ESOPHAGEAL DILATION     ESOPHAGOGASTRODUODENOSCOPY (EGD) WITH PROPOFOL N/A 08/12/2015   Procedure: ESOPHAGOGASTRODUODENOSCOPY (EGD) WITH PROPOFOL;  Surgeon: Manus Gunning, MD;  Location: [place] [facility];  Service: Gastroenterology;  Laterality: N/A;   ESOPHAGOGASTRODUODENOSCOPY (EGD) WITH PROPOFOL N/A 08/17/2020   Procedure: ESOPHAGOGASTRODUODENOSCOPY (EGD) WITH PROPOFOL;  Surgeon: Lavena Bullion, DO;  Location: [place];  Service: Gastroenterology;  Laterality: N/A;   ESOPHAGOGASTRODUODENOSCOPY (EGD) WITH PROPOFOL N/A 06/16/2022   Procedure: ESOPHAGOGASTRODUODENOSCOPY (EGD) WITH PROPOFOL;  Surgeon: Clarene Essex, MD;  Location: [facility];  Service: Gastroenterology;  Laterality: N/A;   EYE SURGERY Bilateral    cataracts   HOT HEMOSTASIS N/A 11/24/2018   Procedure: HOT HEMOSTASIS (ARGON PLASMA COAGULATION/BICAP);  Surgeon: Yetta Flock, MD;  Location: [facility];  Service: Gastroenterology;  Laterality: N/A;   HOT HEMOSTASIS N/A 08/17/2020   Procedure: HOT HEMOSTASIS (ARGON PLASMA COAGULATION/BICAP);  Surgeon: Lavena Bullion, DO;  Location: [facility] ENDOSCOPY;  Service: Gastroenterology;  Laterality: N/A;   HOT  HEMOSTASIS N/A 06/16/2022   Procedure: HOT HEMOSTASIS (ARGON PLASMA COAGULATION/BICAP);  Surgeon: Clarene Essex, MD;  Location: [place];  Service: Gastroenterology;  Laterality: N/A;   I & D EXTREMITY Right 08/06/2016   Procedure: DEBRIDEMENT PIP RIGHT RING FINGER;  Surgeon: Daryll Brod, MD;  Location: [facility];  Service: Orthopedics;  Laterality: Right;   INSERT / REPLACE / REMOVE PACEMAKER     JOINT REPLACEMENT     KNEE ARTHROSCOPY Right    MASS EXCISION Right 08/06/2016   Procedure: EXCISION CYST;  Surgeon: Daryll Brod, MD;  Location: [place];  Service: Orthopedics;  Laterality: Right;  REVISION TOTAL KNEE ARTHROPLASTY Left    TOTAL KNEE ARTHROPLASTY Bilateral     Current Medications: Current Meds  Medication Sig   ACCU-CHEK AVIVA PLUS test strip 1 each by Other route daily.    acetaminophen (TYLENOL) 650 MG CR tablet Take 1,300 mg by mouth every evening.   apixaban (ELIQUIS) 2.5 MG TABS tablet Take 1 tablet (2.5 mg total) by mouth 2 (two) times daily.   BD PEN NEEDLE NANO U/F 32G X 4 MM MISC See admin instructions.   carvedilol (COREG) 12.5 MG tablet Take 1 tablet (12.5 mg total) by mouth 2 (two) times daily with a meal.   Cholecalciferol (VITAMIN D3) 25 MCG (1000 UT) CAPS Take 1,000 Units by mouth daily.   dicyclomine (BENTYL) 10 MG capsule Take 1 capsule (10 mg total) by mouth every 8 (eight) hours as needed for spasms.   febuxostat (ULORIC) 40 MG tablet Take 1 tablet (40 mg total) by mouth every Monday, Wednesday, and Friday.   ferrous sulfate 325 (65 FE) MG EC tablet Take 325 mg by mouth daily with breakfast.   gabapentin (NEURONTIN) 300 MG capsule Take 1 capsule (300 mg total) by mouth at bedtime.   hydrOXYzine (ATARAX) 10 MG tablet Take 10 mg by mouth 2 (two) times daily.   levothyroxine (SYNTHROID) 75 MCG tablet Take 75 mcg by mouth at bedtime.   lidocaine (LIDODERM) 5 % Place 1-3 patches onto the skin See admin instructions. Apply 1-3 patches transdermally every 24 hours as  needed for pain and remove & discard patches within 12 hours or as directed by MD   montelukast (SINGULAIR) 10 MG tablet Take 1 tablet (10 mg total) by mouth at bedtime.   Multiple Vitamin (MULTIVITAMIN WITH MINERALS) TABS tablet Take 1 tablet by mouth daily.   nystatin (NYAMYC) powder APPLY TOPICALLY TO LEFT ABDOMINAL FOLD AND GROIN TWICE A DAY (Patient taking differently: Apply 1 Application topically daily as needed (Fungal infection). APPLY TOPICALLY TO LEFT ABDOMINAL FOLD AND GROIN)   ondansetron (ZOFRAN-ODT) 4 MG disintegrating tablet Take 1 tablet (4 mg total) by mouth every 8 (eight) hours as needed for nausea or vomiting.   pantoprazole (PROTONIX) 40 MG tablet Take 40 mg by mouth daily.   pentoxifylline (TRENTAL) 400 MG CR tablet Take 400 mg by mouth daily. With a meal   rOPINIRole (REQUIP) 0.5 MG tablet Take 0.5 mg by mouth at bedtime.   rosuvastatin (CRESTOR) 5 MG tablet Take 1 tablet (5 mg total) by mouth daily.   sennosides-docusate sodium (SENOKOT-S) 8.6-50 MG tablet Take 1 tablet by mouth daily.   SF 5000 PLUS 1.1 % CREA dental cream Take by mouth as directed.   SIMBRINZA 1-0.2 % SUSP Place 1 drop into the right eye 2 (two) times daily.   tamsulosin (FLOMAX) 0.4 MG CAPS capsule Take 1 capsule (0.4 mg total) by mouth daily.   torsemide (DEMADEX) 20 MG tablet Take 2 tablets (40 mg total) by mouth daily.   traMADol (ULTRAM) 50 MG tablet Take 1 tablet (50 mg total) by mouth 2 (two) times daily.   TRESIBA FLEXTOUCH 100 UNIT/ML FlexTouch Pen Inject 8-10 Units into the skin every evening.     Allergies:   Norvasc [amlodipine besylate], Other, Amlodipine besylate, Avapro [irbesartan], Glucophage [metformin hydrochloride], Lisinopril, Losartan, Lumigan [bimatoprost], Morphine and related, Simvastatin, and Trulicity [dulaglutide]   Social History   Socioeconomic History   Marital status: Divorced    Spouse name: Not on file   Number of children: 2   Years  of education: Not on file    Highest education level: High school graduate  Occupational History   Occupation: retired  Tobacco Use   Smoking status: Former    Packs/day: 2.00    Years: 3.00    Total pack years: 6.00    Types: Cigarettes    Quit date: 06/23/1983    Years since quitting: 39.0    Passive exposure: Never   Smokeless tobacco: Never  Vaping Use   Vaping Use: Never used  Substance and Sexual Activity   Alcohol use: Yes    Alcohol/week: 2.0 standard drinks of alcohol    Types: 2 Glasses of wine per week    Comment: occ   Drug use: No   Sexual activity: Not Currently  Other Topics Concern   Not on file  Social History Narrative   Not on file   Social Determinants of Health   Financial Resource Strain: Low Risk  (05/22/2022)   Overall Financial Resource Strain (CARDIA)    Difficulty of Paying Living Expenses: Not hard at all  Food Insecurity: No Food Insecurity (06/06/2022)   Hunger Vital Sign    Worried About Running Out of Food in the Last Year: Never true    Ran Out of Food in the Last Year: Never true  Transportation Needs: No Transportation Needs (06/06/2022)   PRAPARE - Hydrologist (Medical): No    Lack of Transportation (Non-Medical): No  Physical Activity: Not on file  Stress: Not on file  Social Connections: Not on file     Family History: The patient's family history includes Diabetes in her brother and mother; Hypertension in her sister. There is no history of Colon cancer, Heart attack, Stroke, Colon polyps, Esophageal cancer, Rectal cancer, or Stomach cancer.  ROS:   Please see the history of present illness.     All other systems reviewed and are negative.  EKGs/Labs/Other Studies Reviewed:    The following studies were reviewed today:  Echo 05/11/2022  1. Left ventricular ejection fraction, by estimation, is 30 to 35%. The  left ventricle demonstrates global hypokinesis. There is mild concentric  left ventricular hypertrophy. Left  ventricular diastolic function could  not be evaluated.   2. Right ventricular systolic function is moderately reduced. The right  ventricular size is moderately enlarged. There is severely elevated  pulmonary artery systolic pressure.   3. The mitral valve is normal in structure. Moderate to severe mitral  valve regurgitation. No evidence of mitral stenosis. Moderate mitral  annular calcification.   4. Tricuspid valve regurgitation is severe.   5. The aortic valve is normal in structure. There is mild calcification  of the aortic valve. Aortic valve regurgitation is not visualized. Aortic  valve sclerosis is present, with no evidence of aortic valve stenosis.   6. The inferior vena cava is normal in size with greater than 50%  respiratory variability, suggesting right atrial pressure of 3 mmHg.   Comparison(s): 03/20/22 EF 25-30%. PA pressure 48mHg. Moderate-severe MR.  RV moderately enlarged.   EKG:  EKG is ordered today.  The ekg ordered today demonstrates ventricularly paced rhythm.  Recent Labs: 04/02/2022: NT-Pro BNP 69,331 05/22/2022: TSH 2.716 06/06/2022: ALT 12 06/11/2022: B Natriuretic Peptide 2,120.6; Magnesium 2.1 06/17/2022: BUN 152; Creatinine, Ser 3.37; Potassium 4.5; Sodium 135 06/18/2022: Platelets 169 06/19/2022: Hemoglobin 9.0  Recent Lipid Panel No results found for: "CHOL", "TRIG", "HDL", "CHOLHDL", "VLDL", "LDLCALC", "LDLDIRECT"   Risk Assessment/Calculations:    CHA2DS2-VASc Score = 5  This indicates a 7.2% annual risk of stroke. The patient's score is based upon: CHF History: 0 HTN History: 1 Diabetes History: 1 Stroke History: 0 Vascular Disease History: 0 Age Score: 2 Gender Score: 1          Physical Exam:    VS:  BP (!) 142/60 (BP Location: Left Arm, Patient Position: Sitting, Cuff Size: Large)   Pulse 67   Ht _0  (1.473 m)   Wt 191 lb 9.6 oz (86.9 kg)   SpO2 99%   BMI 40.04 kg/m        Wt Readings from Last 3 Encounters:   07/02/22 191 lb 9.6 oz (86.9 kg)  06/17/22 209 lb 14.1 oz (95.2 kg)  06/02/22 186 lb 12.8 oz (84.7 kg)     GEN:  Well nourished, well developed in no acute distress HEENT: Normal NECK: No JVD; No carotid bruits LYMPHATICS: No lymphadenopathy CARDIAC: RRR, no murmurs, rubs, gallops RESPIRATORY:  Clear to auscultation without rales, wheezing or rhonchi  ABDOMEN: Soft, non-tender, non-distended MUSCULOSKELETAL:  No edema; No deformity  SKIN: Warm and dry NEUROLOGIC:  Alert and oriented x 3 PSYCHIATRIC:  Normal affect   ASSESSMENT:    1. SOB (shortness of breath)   2. Pleural effusion   3. Other ascites   4. Acute on chronic systolic heart failure ([place])   5. Chronic kidney disease (CKD), stage IV (severe) ([place])   6. Essential hypertension   7. Presence of permanent cardiac pacemaker   8. Anemia, unspecified type    PLAN:    In order of problems listed above:  Shortness of breath: Since her recent discharge, she has been complaining of worsening dyspnea.  She appears to be volume overloaded on physical exam.  She is still on 40 mg daily of torsemide.  Her metolazone was taken off during the recent hospitalization.  Her facility obtained blood work this morning, per verbal report, her creatinine has improved to 1.93.  I instructed the facility nurse to give her a single dose of 5 mg metolazone prior to tomorrow morning's torsemide.  I emphasized repeatedly, this is a one-time dose only and not a scheduled regimen.  Pleural effusion: Pleural effusion based on physical exam.  Obtain chest x-ray  Ascites: Patient is complaining of significant abdominal distention, however on physical exam her abdomen appears to be soft.  She does look volume overloaded based on the degree of leg edema and the diminished breath sounds in bilateral bases consistent with pleural effusion, I am not sure if she has a ascites as well.  Will obtain abdominal ultrasound  Acute on chronic systolic heart  failure: Recently admitted to the hospital with heart failure, discharged on 40 mg daily of torsemide and weekly dosing of metolazone.  Unfortunately she returned back to the hospital with GI bleed and anemia, hospitalization complicated by AKI, metolazone was discontinued.  She appears to be volume overloaded today.  I confirmed over the phone that her creatinine has improved to 1.9, I instructed the facility nurse to give her a single dose of metolazone tomorrow morning.  I made it very clear if this is a one-time dose and should not be given as a scheduled regiment.  Will reassess in a few weeks.  Unfortunately she is near palliative care at this point.  I have broached the issue today however did not refer her to palliative care service yet as she still want full treatment.  I think it would be beneficial to discuss goals  of care  CKD stage IV: Followed by Dr. Corliss Parish.  Hypertension: Blood pressure stable  History of permanent pacemaker: Her recent drop in EF was felt to be pacing induced.  She has a follow-up with Dr. Lovena Le.  However given her frailty and comorbidities, I am not confident she is a good candidate for biventricular pacemaker upgrade.  Anemia: Recently underwent evaluation by GI service.  Her hemoglobin is low, I asked the patient's daughter and facility to set up close outpatient visit by GI service.           Medication Adjustments/Labs and Tests Ordered: Current medicines are reviewed at length with the patient today.  Concerns regarding medicines are outlined above.  Orders Placed This Encounter  Procedures   DG Chest 2 View   US Abdomen Complete   EKG 12-Lead   No orders of the defined types were placed in this encounter.   Patient Instructions  Medication Instructions:  Your physician recommends that you continue on your current medications as directed. Please refer to the Current Medication list given to you today.  *If you need a refill on your  cardiac medications before your next appointment, please call your pharmacy*  Lab Work: NONE ordered at this time of appointment   If you have labs (blood work) drawn today and your tests are completely normal, you will receive your results only by: [place] (if you have MyChart) OR A paper copy in the mail If you have any lab test that is abnormal or we need to change your treatment, we will call you to review the results.  Testing/Procedures: A abdominal ultrasound was requested by Tricia Deforest, PA-C. This test is performed at [facility] [address], [place] [address]  A chest x-ray takes a picture of the organs and structures inside the chest, including the heart, lungs, and blood vessels. This test can show several things, including, whether the heart is enlarges; whether fluid is building up in the lungs; and whether pacemaker / defibrillator leads are still in place. This test is performed at [facility] [address], [place] [address]  Follow-Up: At [facility], you and your health needs are our priority.  As part of our continuing mission to provide you with exceptional heart care, we have created designated Provider Care Teams.  These Care Teams include your primary Cardiologist (physician) and Advanced Practice Providers (APPs -  Physician Assistants and Nurse Practitioners) who all work together to provide you with the care you need, when you need it.  We recommend signing up for the patient portal called "MyChart".  Sign up information is provided on this After Visit Summary.  MyChart is used to connect with patients for Virtual Visits (Telemedicine).  Patients are able to view lab/test results, encounter notes, upcoming appointments, etc.  Non-urgent messages can be sent to your provider as well.   To learn more about what you can do with MyChart, go to [email or web address].    Your next appointment:   2 week(s)  Provider:   App  on day Tricia Gonzales is in office      Other Instructions    Signed, Tricia Gonzales, [place]  07/04/2022 10:25 PM    [place]

## 2022-07-04 ENCOUNTER — Encounter: Payer: Self-pay | Admitting: Physician Assistant

## 2022-07-07 ENCOUNTER — Inpatient Hospital Stay: Payer: Medicare Other

## 2022-07-07 ENCOUNTER — Ambulatory Visit (HOSPITAL_COMMUNITY): Admission: RE | Admit: 2022-07-07 | Payer: Medicare Other | Source: Ambulatory Visit

## 2022-07-07 ENCOUNTER — Other Ambulatory Visit: Payer: Self-pay | Admitting: *Deleted

## 2022-07-07 DIAGNOSIS — R188 Other ascites: Secondary | ICD-10-CM

## 2022-07-07 DIAGNOSIS — R140 Abdominal distension (gaseous): Secondary | ICD-10-CM

## 2022-07-08 ENCOUNTER — Telehealth: Payer: Self-pay | Admitting: Cardiovascular Disease

## 2022-07-08 NOTE — Telephone Encounter (Signed)
Helene Kelp from [facility] and [place] states she needs to fax over something but wants to speak to RN first.

## 2022-07-08 NOTE — Telephone Encounter (Signed)
Spoke to [place] in regards to patient, she states they completed the chest xray, and abdominal US has requested at last OV with Almyra Deforest, PA- they completed them at the facility and faxed over results. (under media tab)    I found results, and scanned them into the chart, they would like to know if MD recommended any changes, or what he recommended.   Will route to MD.  Thanks!

## 2022-07-08 NOTE — Telephone Encounter (Signed)
Called Tricia Gonzales back, advised of message from MD.   They verbalized understanding, she has an appointment with Nephrology team next week as well and they can discuss with them.   Tricia Gonzales verbalized understanding

## 2022-07-08 NOTE — Telephone Encounter (Signed)
Caller stated they are following-up on home health certification and plan of care orders for this patient.  Order# [id number].  Caller stated the document will need to signed and faxed back to her at fax# [phone].

## 2022-07-08 NOTE — Telephone Encounter (Signed)
Called home health back. Advised that we would look over orders, Dr.O'Neal was out of office until next week. They verbalized understanding.

## 2022-07-14 ENCOUNTER — Telehealth: Payer: Self-pay | Admitting: Physician Assistant

## 2022-07-14 NOTE — Telephone Encounter (Signed)
Tricia Gonzales with [facility] calling back in regards to outstanding order. She says she called 1/17 and they still have not received it. She says they need it signed dated and faxed back. Order number: [id number]

## 2022-07-14 NOTE — Progress Notes (Deleted)
Cardiology Clinic Note   Patient Name: Tricia Gonzales Date of Encounter: 07/14/2022  Primary Care Provider:  Carol Ada, MD Primary Cardiologist:  Evalina Field, MD  Patient Profile    Tricia Gonzales 82 year old female presents the clinic today for follow-up evaluation of her lower extremity edema and shortness of breath.  Past Medical History    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   Anxiety    Arthritis    Atrial fibrillation (HCC)    Avascular necrosis (HCC) hip left   leg pain also   Chronic back pain    Chronic kidney disease (CKD), stage III (moderate) (HCC)    Depression    Diverticulosis    Early cataracts, bilateral    Esophageal stricture    Gastritis    GAVE (gastric antral vascular ectasia)    GERD (gastroesophageal reflux disease)    Glaucoma    Heart murmur     " some doctors say that I have one some say that I don"t "   Hiatal hernia    History of blood transfusion    "@ least w/1st knee OR"   History of gout    History of kidney stones    Hyperlipidemia    Hypertension    Intestinal obstruction ([place])    Kidney stones    Obesity    Osteoarthritis    Osteoporosis    Pericarditis 2016   Presence of permanent cardiac pacemaker    Sjogren's disease ([place])    Thyroid disease    Type II diabetes mellitus ([place])    Vitamin B12 deficiency    Past Surgical History:  Procedure Laterality Date   ABDOMINAL HYSTERECTOMY     complete   CARDIOVERSION N/A 07/31/2020   Procedure: CARDIOVERSION;  Surgeon: Werner Lean, MD;  Location: [facility];  Service: Cardiovascular;  Laterality: N/A;   CHOLECYSTECTOMY OPEN     COLECTOMY     for rectovaginal fistula   COLONOSCOPY  01/07/2012   Procedure: COLONOSCOPY;  Surgeon: Lafayette Dragon, MD;  Location: [facility];  Service: Endoscopy;  Laterality: N/A;   EP IMPLANTABLE DEVICE N/A 06/18/2015   Procedure: Pacemaker Implant;  Surgeon: Evans Lance, MD;  Location: [facility];  Service: Cardiovascular;  Laterality: N/A;   ESOPHAGEAL DILATION  08/17/2020   Procedure: ESOPHAGEAL DILATION;  Surgeon: Lavena Bullion, DO;  Location: [facility];  Service: Gastroenterology;;   ESOPHAGOGASTRODUODENOSCOPY N/A 11/24/2018   Procedure: ESOPHAGOGASTRODUODENOSCOPY (EGD);  Surgeon: Yetta Flock, MD;  Location: [facility];  Service: Gastroenterology;  Laterality: N/A;   ESOPHAGOGASTRODUODENOSCOPY (EGD) WITH ESOPHAGEAL DILATION     ESOPHAGOGASTRODUODENOSCOPY (EGD) WITH PROPOFOL N/A 08/12/2015   Procedure: ESOPHAGOGASTRODUODENOSCOPY (EGD) WITH PROPOFOL;  Surgeon: Manus Gunning, MD;  Location: [facility];  Service: Gastroenterology;  Laterality: N/A;   ESOPHAGOGASTRODUODENOSCOPY (EGD) WITH PROPOFOL N/A 08/17/2020   Procedure: ESOPHAGOGASTRODUODENOSCOPY (EGD) WITH PROPOFOL;  Surgeon: Lavena Bullion, DO;  Location: [facility];  Service: Gastroenterology;  Laterality: N/A;   ESOPHAGOGASTRODUODENOSCOPY (EGD) WITH PROPOFOL N/A 06/16/2022   Procedure: ESOPHAGOGASTRODUODENOSCOPY (EGD) WITH PROPOFOL;  Surgeon: Clarene Essex, MD;  Location: [place];  Service: Gastroenterology;  Laterality: N/A;   EYE SURGERY Bilateral    cataracts   HOT HEMOSTASIS N/A 11/24/2018   Procedure: HOT HEMOSTASIS (ARGON PLASMA COAGULATION/BICAP);  Surgeon: Yetta Flock, MD;  Location: [facility];  Service: Gastroenterology;  Laterality: N/A;   HOT HEMOSTASIS N/A  08/17/2020   Procedure: HOT HEMOSTASIS (ARGON PLASMA COAGULATION/BICAP);  Surgeon: Lavena Bullion, DO;  [facility]COPY;  Service: Gastroenterology;  Laterality: N/A;   HOT HEMOSTASIS N/A 06/16/2022   Procedure: HOT HEMOSTASIS (ARGON PLASMA COAGULATION/BICAP);  Surgeon: Clarene Essex, MD;  Location: [place];  Service: Gastroenterology;  Laterality: N/A;   I & D EXTREMITY Right 08/06/2016   Procedure: DEBRIDEMENT PIP RIGHT RING FINGER;  Surgeon: Daryll Brod, MD;   Location: [place];  Service: Orthopedics;  Laterality: Right;   INSERT / REPLACE / REMOVE PACEMAKER     JOINT REPLACEMENT     KNEE ARTHROSCOPY Right    MASS EXCISION Right 08/06/2016   Procedure: EXCISION CYST;  Surgeon: Daryll Brod, MD;  Location: [place];  Service: Orthopedics;  Laterality: Right;   REVISION TOTAL KNEE ARTHROPLASTY Left    TOTAL KNEE ARTHROPLASTY Bilateral     Allergies  Allergies  Allergen Reactions   Norvasc [Amlodipine Besylate] Shortness Of Breath   Other Rash and Other (See Comments)    Pt voiced she is allergic to microfiber materials/ blankets/sheets!!   Amlodipine Besylate     Other reaction(s): SOB   Avapro [Irbesartan] Other (See Comments)    Headaches   Glucophage [Metformin Hydrochloride] Other (See Comments)    "increases creatinine" and diarrhea   Lisinopril     Headache    Losartan Other (See Comments)    Headaches    Lumigan [Bimatoprost] Other (See Comments)    Eye burning and irritation   Morphine And Related Other (See Comments)    "Hallucinations"   Simvastatin Other (See Comments)    Body aches   Trulicity [Dulaglutide] Other (See Comments)    Severe mood swings    History of Present Illness    Tricia Gonzales is a PMH of paroxysmal atrial fibrillation, chronic systolic CHF, HLD, HTN, type 2 diabetes, CKD stage IV-5.  She follows with nephrology service, Dr. Vanetta Mulders.  She had a Medtronic dual-chamber pacemaker placed in 2016 due to junctional bradycardia.  Limited echocardiogram obtained 2022 showed an EF of 50-55%, moderately elevated pulmonary artery systolic pressure, moderate-severe MR.  She has history of GI bleed and Sjogren's.  She was seen by Dr. Audie Box 8/23.  During that time she was maintaining sinus rhythm and reported compliance with apixaban.  (2.5 mg twice daily).  Her blood pressure was noted to be elevated and her carvedilol was increased to 6.25 mg twice daily.  She was seen by Almyra Deforest PA-C on 03/17/2022 for  dyspnea on exertion which had been ongoing months.  She reported that her symptoms have been present since her cardiac medication was adjusted including carvedilol and lowering apixaban.  It was felt that the symptoms were unlikely due to her medication adjustment.  It was felt that her shortness of breath was due to worsening valve issue and repeat echocardiogram was recommended.  She was maintaining sinus rhythm.  Her torsemide was increased to 20 mg for 3 days.  Her echocardiogram 03/20/2022 showed an EF of 25-30%, moderate enlargement of her RV with RV systolic pressure of 53.9 mmHg, moderately dilated left and right atria, moderate-severe MR with significant artifact, moderate MAC and severe TR.  She was seen in follow-up in October.  During that time she was noted to be in paroxysmal atrial tachycardia.  Dr. Sallyanne Kuster was able to pace her out of atrial tachycardia using her pacemaker.  Her carvedilol was increased for suppression.  She was seen by Dr. Lovena Le who further  increased her carvedilol.  She followed up with Almyra Deforest PA-C in November.  During that time she was very fatigued after carvedilol increased.  Her case was discussed with Dr. Audie Box.  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  She was admitted to the hospital for further diuresis.  A limited echocardiogram at that time showed an EF of 30-35%, mild LVH, moderate reduced RV systolic function, severely elevated pulmonary artery systolic pressure, moderate-severe MR, severe TR.  Her device was interrogated and showed high RV pacing at 88%.  Again it was felt that she was having pacing induced cardiomyopathy and recommended EP to upgrade to biventricular pacemaker.  It was also recommended that she be referred to heart failure service.  Patient continued to decline.  She diuresed 7 L with IV furosemide.  Her creatinine trended up from 2.4-3.35.  GFR was too low to start SGLT2 inhibitor.  She was discharged on 40 mg daily  of torsemide along with metolazone 5 mg every Monday.  Her discharge weight was 191 pounds.    She was seen in the [facility] clinic 06/02/2022.  At that time her weight was 186 pounds.  It was again recommended that she have CRT-P upgrade.  She had GI symptoms with poor response to torsemide which was felt to be right greater than left heart failure.  She was admitted to the hospital 06/05/2022 with worsening malaise, shortness of breath and fever.  She was noted to be COVID-positive with associated pneumonia.  Her creatinine increased to 4.1.  She was noted to have atrial fibrillation with right bundle branch block and ST depression and V5 and V6.  She was admitted with GI bleed and prerenal AKI in the setting of COVID infection.  Her home torsemide and metolazone were held due to her AKI.  She declined hemodialysis.  Nephrology service recommended palliative care/hospice referral if renal function continues to decline.  She was treated with remdesivir ceftriaxone and azithromycin for her pneumonia.  GI was consulted and she underwent upper endoscopy which showed erosive gastropathy.  She required 5 units of PRBCs.  She was discharged on 1229 and it was recommended that she resume Eliquis 06/22/2022.  Her creatinine at discharge was 3.37.  Her torsemide was resumed at the time of discharge.  However, her metolazone was discontinued.  She was seen in follow-up by Almyra Deforest PA-C 07/02/2022.  She presented with her daughter.  She continued to be very fatigued and was noting worsening dyspnea.  She denied chest pain.  She felt that her breathing was getting worse since discharge.  She was noted to have 2+ pitting edema.  She complained of abdominal distention.  It was felt that she had small amount of ascites.  Abdominal ultrasound was recommended.  Chest x-ray was ordered.  She was encouraged to follow-up with nephrology for diuretic management due to her significant CKD.  She presents to the clinic today for  follow-up evaluation and states***  *** denies chest pain, shortness of breath, lower extremity edema, fatigue, palpitations, melena, hematuria, hemoptysis, diaphoresis, weakness, presyncope, syncope, orthopnea, and PND.   Shortness of breath, acute on chronic systolic CHF-continues to have 2+ bilateral lower extremity pitting edema.  Weight today***.  Echocardiogram 05/11/2022 showed improved EF to 30-35%, right ventricle moderately dilated, moderate-severe mitral valve regurgitation Continue torsemide, carvedilol Heart healthy low-sodium diet Increase physical activity as tolerated  Essential hypertension-BP today***. Continue to monitor Continue current medical therapy  PPM-noted to have recent drop in  EF which was felt to be pacing induced.  Continues to follow with EP.  Previously felt that she would benefit from [place] PPM upgrade.  However due to frailty she is not a good candidate for surgery.  CKD stage IV-continues to follow with nephrology.  Nephrology is managing diuretic therapy.  Disposition: Follow-up with Dr. Audie Box or Almyra Deforest PA-C in 2-3 months.  Home Medications    Prior to Admission medications   Medication Sig Start Date End Date Taking? Authorizing Provider  ACCU-CHEK AVIVA PLUS test strip 1 each by Other route daily.  03/15/15   [provider]  acetaminophen (TYLENOL) 650 MG CR tablet Take 1,300 mg by mouth every evening.    [provider]  apixaban (ELIQUIS) 2.5 MG TABS tablet Take 1 tablet (2.5 mg total) by mouth 2 (two) times daily. 06/22/22   Mariel Aloe, MD  BD PEN NEEDLE NANO U/F 32G X 4 MM MISC See admin instructions. 12/25/19   [provider]  carvedilol (COREG) 12.5 MG tablet Take 1 tablet (12.5 mg total) by mouth 2 (two) times daily with a meal. 06/19/22   Mariel Aloe, MD  Cholecalciferol (VITAMIN D3) 25 MCG (1000 UT) CAPS Take 1,000 Units by mouth daily.    [provider]  cycloSPORINE (RESTASIS) 0.05 % ophthalmic  emulsion Place 1 drop into both eyes 2 (two) times daily as needed (eye dryness). Patient not taking: Reported on 07/02/2022 10/30/20   [provider]  dicyclomine (BENTYL) 10 MG capsule Take 1 capsule (10 mg total) by mouth every 8 (eight) hours as needed for spasms. 10/07/21   Yetta Flock, MD  febuxostat (ULORIC) 40 MG tablet Take 1 tablet (40 mg total) by mouth every Monday, Wednesday, and Friday. 05/12/19   Granville Lewis C, PA-C  ferrous sulfate 325 (65 FE) MG EC tablet Take 325 mg by mouth daily with breakfast.    [provider]  gabapentin (NEURONTIN) 300 MG capsule Take 1 capsule (300 mg total) by mouth at bedtime. 06/19/22   Mariel Aloe, MD  hydrOXYzine (ATARAX) 10 MG tablet Take 10 mg by mouth 2 (two) times daily. 06/26/22   [provider]  levothyroxine (SYNTHROID) 75 MCG tablet Take 75 mcg by mouth at bedtime. 04/14/21   [provider]  lidocaine (LIDODERM) 5 % Place 1-3 patches onto the skin See admin instructions. Apply 1-3 patches transdermally every 24 hours as needed for pain and remove & discard patches within 12 hours or as directed by MD    [provider]  montelukast (SINGULAIR) 10 MG tablet Take 1 tablet (10 mg total) by mouth at bedtime. 05/12/19   Wille Celeste, PA-C  Multiple Vitamin (MULTIVITAMIN WITH MINERALS) TABS tablet Take 1 tablet by mouth daily. 06/20/22   Mariel Aloe, MD  nystatin [facility]) powder APPLY TOPICALLY TO LEFT ABDOMINAL FOLD AND GROIN TWICE A DAY Patient taking differently: Apply 1 Application topically daily as needed (Fungal infection). APPLY TOPICALLY TO LEFT ABDOMINAL FOLD AND GROIN 05/12/19   Granville Lewis C, PA-C  ondansetron (ZOFRAN-ODT) 4 MG disintegrating tablet Take 1 tablet (4 mg total) by mouth every 8 (eight) hours as needed for nausea or vomiting. 11/11/21   Armbruster, Carlota Raspberry, MD  pantoprazole (PROTONIX) 40 MG tablet Take 40 mg by mouth daily.    [provider]   pentoxifylline (TRENTAL) 400 MG CR tablet Take 400 mg by mouth daily. With a meal    [provider]  rOPINIRole (  REQUIP) 0.5 MG tablet Take 0.5 mg by mouth at bedtime. 02/23/20   [provider]  rosuvastatin (CRESTOR) 5 MG tablet Take 1 tablet (5 mg total) by mouth daily. 05/12/19   Granville Lewis C, PA-C  sennosides-docusate sodium (SENOKOT-S) 8.6-50 MG tablet Take 1 tablet by mouth daily.    [provider]  SF 5000 PLUS 1.1 % CREA dental cream Take by mouth as directed. 12/13/21   [provider]  SIMBRINZA 1-0.2 % SUSP Place 1 drop into the right eye 2 (two) times daily. 03/24/22   [provider]  tamsulosin (FLOMAX) 0.4 MG CAPS capsule Take 1 capsule (0.4 mg total) by mouth daily. 06/20/22   Mariel Aloe, MD  torsemide (DEMADEX) 20 MG tablet Take 2 tablets (40 mg total) by mouth daily. 03/27/22   Almyra Deforest, PA  traMADol (ULTRAM) 50 MG tablet Take 1 tablet (50 mg total) by mouth 2 (two) times daily. 06/19/22   Mariel Aloe, MD  TRESIBA FLEXTOUCH 100 UNIT/ML FlexTouch Pen Inject 8-10 Units into the skin every evening. 02/21/20   [provider]    Family History    Family History  Problem Relation Age of Onset   Diabetes Mother    Hypertension Sister    Diabetes Brother    Colon cancer Neg Hx    Heart attack Neg Hx    Stroke Neg Hx    Colon polyps Neg Hx    Esophageal cancer Neg Hx    Rectal cancer Neg Hx    Stomach cancer Neg Hx    She indicated that her mother is deceased. She indicated that her father is deceased. She indicated that both of her sisters are alive. She indicated that her brother is deceased. She indicated that her maternal grandmother is deceased. She indicated that her maternal grandfather is deceased. She indicated that her paternal grandmother is deceased. She indicated that her paternal grandfather is deceased. She indicated that the status of her neg hx is unknown.  Social History    Social History    Socioeconomic History   Marital status: Divorced    Spouse name: Not on file   Number of children: 2   Years of education: Not on file   Highest education level: High school graduate  Occupational History   Occupation: retired  Tobacco Use   Smoking status: Former    Packs/day: 2.00    Years: 3.00    Total pack years: 6.00    Types: Cigarettes    Quit date: 06/23/1983    Years since quitting: 39.0    Passive exposure: Never   Smokeless tobacco: Never  Vaping Use   Vaping Use: Never used  Substance and Sexual Activity   Alcohol use: Yes    Alcohol/week: 2.0 standard drinks of alcohol    Types: 2 Glasses of wine per week    Comment: occ   Drug use: No   Sexual activity: Not Currently  Other Topics Concern   Not on file  Social History Narrative   Not on file   Social Determinants of Health   Financial Resource Strain: Low Risk  (05/22/2022)   Overall Financial Resource Strain (CARDIA)    Difficulty of Paying Living Expenses: Not hard at all  Food Insecurity: No Food Insecurity (06/06/2022)   Hunger Vital Sign    Worried About Running Out of Food in the Last Year: Never true    Ran Out of Food in the Last Year: Never true  Transportation Needs: No Transportation Needs (06/06/2022)   PRAPARE - Hydrologist (Medical): No    Lack of Transportation (Non-Medical): No  Physical Activity: Not on file  Stress: Not on file  Social Connections: Not on file  Intimate Partner Violence: Not At Risk (06/06/2022)   Humiliation, Afraid, Rape, and Kick questionnaire    Fear of Current or Ex-Partner: No    Emotionally Abused: No    Physically Abused: No    Sexually Abused: No     Review of Systems    General:  No chills, fever, night sweats or weight changes.  Cardiovascular:  No chest pain, dyspnea on exertion, edema, orthopnea, palpitations, paroxysmal nocturnal dyspnea. Dermatological: No rash, lesions/masses Respiratory: No cough,  dyspnea Urologic: No hematuria, dysuria Abdominal:   No nausea, vomiting, diarrhea, bright red blood per rectum, melena, or hematemesis Neurologic:  No visual changes, wkns, changes in mental status. All other systems reviewed and are otherwise negative except as noted above.  Physical Exam    VS:  There were no vitals taken for this visit. , BMI There is no height or weight on file to calculate BMI. GEN: Well nourished, well developed, in no acute distress. HEENT: normal. Neck: Supple, no JVD, carotid bruits, or masses. Cardiac: RRR, no murmurs, rubs, or gallops. No clubbing, cyanosis, edema.  Radials/DP/PT 2+ and equal bilaterally.  Respiratory:  Respirations regular and unlabored, clear to auscultation bilaterally. GI: Soft, nontender, nondistended, BS + x 4. MS: no deformity or atrophy. Skin: warm and dry, no rash. Neuro:  Strength and sensation are intact. Psych: Normal affect.  Accessory Clinical Findings    Recent Labs: 04/02/2022: NT-Pro BNP 69,331 05/22/2022: TSH 2.716 06/06/2022: ALT 12 06/11/2022: B Natriuretic Peptide 2,120.6; Magnesium 2.1 06/17/2022: BUN 152; Creatinine, Ser 3.37; Potassium 4.5; Sodium 135 06/18/2022: Platelets 169 06/19/2022: Hemoglobin 9.0   Recent Lipid Panel No results found for: "CHOL", "TRIG", "HDL", "CHOLHDL", "VLDL", "LDLCALC", "LDLDIRECT"  No BP recorded.  {Refresh Note OR Click here to enter BP  :1}***    ECG personally reviewed by me today- *** - No acute changes  Echocardiogram 05/11/2022 IMPRESSIONS     1. Left ventricular ejection fraction, by estimation, is 30 to 35%. The  left ventricle demonstrates global hypokinesis. There is mild concentric  left ventricular hypertrophy. Left ventricular diastolic function could  not be evaluated.   2. Right ventricular systolic function is moderately reduced. The right  ventricular size is moderately enlarged. There is severely elevated  pulmonary artery systolic pressure.   3. The  mitral valve is normal in structure. Moderate to severe mitral  valve regurgitation. No evidence of mitral stenosis. Moderate mitral  annular calcification.   4. Tricuspid valve regurgitation is severe.   5. The aortic valve is normal in structure. There is mild calcification  of the aortic valve. Aortic valve regurgitation is not visualized. Aortic  valve sclerosis is present, with no evidence of aortic valve stenosis.   6. The inferior vena cava is normal in size with greater than 50%  respiratory variability, suggesting right atrial pressure of 3 mmHg.   Comparison(s): 03/20/22 EF 25-30%. PA pressure 31mHg. Moderate-severe MR.  RV moderately enlarged.   FINDINGS   Left Ventricle: Left ventricular ejection fraction, by estimation, is 30  to 35%. The left ventricle demonstrates global hypokinesis. The left  ventricular internal cavity size was normal in size. There is mild  concentric left ventricular hypertrophy.  Left ventricular diastolic function could not be evaluated.  Left  ventricular diastolic function could not be evaluated due to mitral  annular calcification (moderate or greater).   Right Ventricle: The right ventricular size is moderately enlarged. No  increase in right ventricular wall thickness. Right ventricular systolic  function is moderately reduced. There is severely elevated pulmonary  artery systolic pressure. The tricuspid  regurgitant velocity is 3.44 m/s, and with an assumed right atrial  pressure of 15 mmHg, the estimated right ventricular systolic pressure is  06.7 mmHg.   Left Atrium: Left atrial size was normal in size.   Right Atrium: Right atrial size was normal in size.   Pericardium: There is no evidence of pericardial effusion.   Mitral Valve: The mitral valve is normal in structure. Moderate mitral  annular calcification. Moderate to severe mitral valve regurgitation, with  posteriorly-directed jet. No evidence of mitral valve stenosis.    Tricuspid Valve: The tricuspid valve is normal in structure. Tricuspid  valve regurgitation is severe. No evidence of tricuspid stenosis.   Aortic Valve: The aortic valve is normal in structure. There is mild  calcification of the aortic valve. Aortic valve regurgitation is not  visualized. Aortic valve sclerosis is present, with no evidence of aortic  valve stenosis.   Pulmonic Valve: The pulmonic valve was normal in structure. Pulmonic valve  regurgitation is not visualized. No evidence of pulmonic stenosis.   Aorta: The aortic root is normal in size and structure.   Venous: The inferior vena cava is normal in size with greater than 50%  respiratory variability, suggesting right atrial pressure of 3 mmHg.   IAS/Shunts: No atrial level shunt detected by color flow Doppler.    Assessment & Plan   1.  ***   Jossie Ng. Ronniesha Seibold NP-C     07/14/2022, 12:46 PM [address] Office [phone] Fax [phone]    I spent***minutes examining this patient, reviewing medications, and using patient centered shared decision making involving her cardiac care.  Prior to her visit I spent greater than 20 minutes reviewing her past medical history,  medications, and prior cardiac tests.

## 2022-07-14 NOTE — Telephone Encounter (Signed)
Will have MD sign request.  Will fax back once completed.  Thanks!

## 2022-07-15 NOTE — Telephone Encounter (Signed)
Contacted [facility], advised I did not have the orders, but I would be on the look out for them if they would refax.  Advised of fax number, and they will send them.  Thanks!

## 2022-07-16 ENCOUNTER — Inpatient Hospital Stay: Payer: Medicare Other

## 2022-07-16 ENCOUNTER — Inpatient Hospital Stay: Payer: Medicare Other | Attending: Oncology

## 2022-07-16 ENCOUNTER — Ambulatory Visit: Payer: Medicare Other | Admitting: General Practice

## 2022-07-16 ENCOUNTER — Inpatient Hospital Stay (HOSPITAL_BASED_OUTPATIENT_CLINIC_OR_DEPARTMENT_OTHER): Payer: Medicare Other | Admitting: Nurse Practitioner

## 2022-07-16 ENCOUNTER — Encounter: Payer: Self-pay | Admitting: Nurse Practitioner

## 2022-07-16 VITALS — BP 121/69 | HR 68 | Temp 98.1°F | Resp 18 | Ht <= 58 in | Wt 208.0 lb

## 2022-07-16 DIAGNOSIS — N183 Chronic kidney disease, stage 3 unspecified: Secondary | ICD-10-CM | POA: Diagnosis present

## 2022-07-16 DIAGNOSIS — N189 Chronic kidney disease, unspecified: Secondary | ICD-10-CM

## 2022-07-16 DIAGNOSIS — D631 Anemia in chronic kidney disease: Secondary | ICD-10-CM | POA: Insufficient documentation

## 2022-07-16 LAB — CBC WITH DIFFERENTIAL (CANCER CENTER ONLY)
Abs Immature Granulocytes: 0.06 10*3/uL (ref 0.00–0.07)
Basophils Absolute: 0 10*3/uL (ref 0.0–0.1)
Basophils Relative: 0 %
Eosinophils Absolute: 0.1 10*3/uL (ref 0.0–0.5)
Eosinophils Relative: 1 %
HCT: 26.5 % — ABNORMAL LOW (ref 36.0–46.0)
Hemoglobin: 8.1 g/dL — ABNORMAL LOW (ref 12.0–15.0)
Immature Granulocytes: 1 %
Lymphocytes Relative: 5 %
Lymphs Abs: 0.3 10*3/uL — ABNORMAL LOW (ref 0.7–4.0)
MCH: 31.5 pg (ref 26.0–34.0)
MCHC: 30.6 g/dL (ref 30.0–36.0)
MCV: 103.1 fL — ABNORMAL HIGH (ref 80.0–100.0)
Monocytes Absolute: 0.4 10*3/uL (ref 0.1–1.0)
Monocytes Relative: 7 %
Neutro Abs: 5.2 10*3/uL (ref 1.7–7.7)
Neutrophils Relative %: 86 %
Platelet Count: 200 10*3/uL (ref 150–400)
RBC: 2.57 MIL/uL — ABNORMAL LOW (ref 3.87–5.11)
RDW: 17.6 % — ABNORMAL HIGH (ref 11.5–15.5)
WBC Count: 6.1 10*3/uL (ref 4.0–10.5)
nRBC: 0 % (ref 0.0–0.2)

## 2022-07-16 LAB — IRON AND TIBC
Iron: 54 ug/dL (ref 28–170)
Saturation Ratios: 17 % (ref 10.4–31.8)
TIBC: 323 ug/dL (ref 250–450)
UIBC: 269 ug/dL

## 2022-07-16 LAB — FERRITIN: Ferritin: 117 ng/mL (ref 11–307)

## 2022-07-16 MED ORDER — EPOETIN ALFA-EPBX 40000 UNIT/ML IJ SOLN
40000.0000 [IU] | Freq: Once | INTRAMUSCULAR | Status: AC
  Administered 2022-07-16: 40000 [IU] via SUBCUTANEOUS

## 2022-07-16 MED ORDER — EPOETIN ALFA-EPBX 10000 UNIT/ML IJ SOLN
20000.0000 [IU] | Freq: Once | INTRAMUSCULAR | Status: AC
  Administered 2022-07-16: 20000 [IU] via SUBCUTANEOUS

## 2022-07-16 NOTE — Progress Notes (Signed)
[facility] OFFICE PROGRESS NOTE   Diagnosis: Anemia, chronic renal failure  INTERVAL HISTORY:   Tricia Gonzales returns for follow-up.  She last received an erythropoietin injection on 05/19/2022.  She was hospitalized 05/21/2022 through 05/28/2022 with acute on chronic systolic heart failure.    She was hospitalized 06/05/2022 through 06/19/2022 with an acute upper GI bleed.  She received blood transfusions 06/05/2022, 06/06/2022, 06/10/2022, 06/15/2022.  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  She reports stable dyspnea.  Currently on oxygen at 3 L.  She denies bleeding.  Reports she has been referred to hospice due to worsening renal function.  As states "my kidneys are shot".  She has declined dialysis.  She would ultimately like to return to her home from the nursing facility.  She is trying to make arrangements so she will have help at home.    Objective:  Vital signs in last 24 hours:  Blood pressure 121/69, pulse 68, temperature 98.1 F (36.7 C), temperature source Oral, resp. rate 18, height _0  (1.473 m), weight 208 lb (94.3 kg), SpO2 100 %.   Resp: Faint rales at both lung bases.  Distant breath sounds. Cardio: Regular rate and rhythm. GI: Abdomen mildly distended, areas of abdominal wall edema. Vascular: Pitting edema lower leg bilaterally. Neuro: Alert and oriented.   Lab Results:  Lab Results  Component Value Date   WBC 6.1 07/16/2022   HGB 8.1 (L) 07/16/2022   HCT 26.5 (L) 07/16/2022   MCV 103.1 (H) 07/16/2022   PLT 200 07/16/2022   NEUTROABS 5.2 07/16/2022    Imaging:  No results found.  Medications: I have reviewed the patient's current medications.  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; quantitative immunoglobulins show IgG mildly decreased 654 (range [phone]), IgA and IgM in normal range; polyclonal serum light chain elevation 11/15/2015-hemoglobin 11.2 11/29/2015-hemoglobin 11.1 12/12/2005-hemoglobin 10.4; Aranesp 100 g every 2 weeks initiated 02/07/2016-hemoglobin 11.0, Aranesp held, changed to an every 3 week schedule Progressive anemia and decreased iron stores December 2017, treated with IV iron January 2018 with improvement She did not require Aranesp for 2 months and resumed Aranesp on a monthly schedule for 08/2016 Aranesp every 6 weeks beginning 05/04/2017 Progressive anemia and decreased iron stores 07/05/2017; IV iron  given 07/28/2017 and 08/06/2017 Persistent severe anemia, Aranesp change to every 2 weeks beginning 10/19/2017 Persistent anemia, decreased iron stores 11/30/2017 IV iron 12/13/2017, 12/20/2017, Decreased iron stores 05/17/2018  IV iron 06/24/2018 07/05/2018- no serum M spike, polyclonal serum light chain elevation, IgG mildly decreased with IgA and IgM within normal range  2 units of blood 08/17/2018 IV iron 08/17/2018 and 08/24/2018 Stool heme +08/30/2018, referred to GI 2 units of blood 09/01/2018 1 unit of blood 10/25/2018 11/11/2018 colonoscopy- diverticulosis in the entire examined colon.  End-to-end colocolonic anastomosis.  One 5 mm polyp in the ascending colon.  No cause for anemia. 11/11/2018 upper endoscopy- evidence of gastric antral vascular ectasia which is the likely cause of the patient's worsening anemia. 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 Hemoglobin stabilized following treatment of the  Gave Every 6-week  erythropoietin 06/22/2019, erythropoietin dose increased to 60,000 units 09/14/2019 Erythropoietin 60,000 units every 3 weeks 10/24/2019 Schedule changed to every 4 weeks 12/15/2019 IV iron 02/21/2020 and 02/29/2020, 06/25/2020, 07/03/2020 Erythropoietin adjusted to every 2 weeks beginning 05/23/2020 Erythropoietin changed to every 3 weeks beginning 11/12/2020 Erythropoietin continued every 3 weeks 01/14/2021 Erythropoietin changed to every 6 weeks 03/18/2021   2.   Chronic renal insufficiency   3.    Atrial fibrillation-maintained on apixaban   4.    Osteoarthritis   5.     Diabetes   6.     Sjogren's disease   7.     Pericarditis February 2017   8.     Colonoscopy 01/07/2012-prior segmental colectomy in the sigmoid colon; moderate diverticulosis throughout the colon; otherwise normal examination  Disposition: Tricia Gonzales has anemia due to chronic renal failure, GI blood loss.  She most recently has been maintained on erythropoietin every 6 weeks.  Since her last visit she has had several hospitalizations, most recently with a GI bleed.  She is currently residing at a nursing facility.  At today's visit she reports she has been referred to hospice due to progressive decline in renal function.  We will request Dr. Lynnell Jude office note from yesterday.  She will receive an erythropoietin injection today.  Lab, follow-up, possible IV iron in 2 to 3 weeks.  Patient seen with Dr. Benay Spice.    Ned Card ANP/GNP-BC   07/16/2022  9:37 AM  Tricia Gonzales was interviewed and examined.  Tricia Gonzales has been followed [facility] the cancer center for management of anemia related to chronic GI bleeding and renal insufficiency.  She has recently been admitted with COVID-19, so she was admitted GI bleeding and progressive renal failure.  She is now in a skilled nursing facility.  Her performance status is poor.  She says she has been referred for hospice care.  She has declined dialysis.  I contacted  Dr. Clover Mealy.  She confirms Tricia Gonzales is a hospice candidate based on the renal failure diagnosis.  She appears to be appropriate for hospice based on the renal failure and multiple comorbid conditions.  She has not yet enrolled in hospice care.  We will continue to follow her for anemia until she has enrolled in hospice.  I was present for greater than 50% of today's visit.  I performed medical decision making.  Julieanne Manson, MD

## 2022-07-16 NOTE — Progress Notes (Signed)
Per Ned Card, NP ok to proceed with retacrit injection today.  Patient will be scheduled for iron infusion at a later date.  Patient made aware of change in treatment today.

## 2022-07-16 NOTE — Patient Instructions (Signed)

## 2022-07-21 ENCOUNTER — Ambulatory Visit: Payer: Medicare Other | Admitting: Internal Medicine

## 2022-07-22 NOTE — Progress Notes (Signed)
Cardiology Clinic Note   Patient Name: Tricia Gonzales Date of Encounter: 07/24/2022  Primary Care Provider:  Carol Ada, MD Primary Cardiologist:  Evalina Field, MD  Patient Profile    Tricia Gonzales 82 year old female presents the clinic today for follow-up evaluation of her lower extremity edema and shortness of breath.  Past Medical History    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   Anxiety    Arthritis    Atrial fibrillation (HCC)    Avascular necrosis (HCC) hip left   leg pain also   Chronic back pain    Chronic kidney disease (CKD), stage III (moderate) (HCC)    Depression    Diverticulosis    Early cataracts, bilateral    Esophageal stricture    Gastritis    GAVE (gastric antral vascular ectasia)    GERD (gastroesophageal reflux disease)    Glaucoma    Heart murmur     " some doctors say that I have one some say that I don"t "   Hiatal hernia    History of blood transfusion    "@ least w/1st knee OR"   History of gout    History of kidney stones    Hyperlipidemia    Hypertension    Intestinal obstruction ([place])    Kidney stones    Obesity    Osteoarthritis    Osteoporosis    Pericarditis 2016   Presence of permanent cardiac pacemaker    Sjogren's disease ([place])    Thyroid disease    Type II diabetes mellitus ([place])    Vitamin B12 deficiency    Past Surgical History:  Procedure Laterality Date   ABDOMINAL HYSTERECTOMY     complete   CARDIOVERSION N/A 07/31/2020   Procedure: CARDIOVERSION;  Surgeon: Werner Lean, MD;  Location: [facility];  Service: Cardiovascular;  Laterality: N/A;   CHOLECYSTECTOMY OPEN     COLECTOMY     for rectovaginal fistula   COLONOSCOPY  01/07/2012   Procedure: COLONOSCOPY;  Surgeon: Lafayette Dragon, MD;  Location: [facility];  Service: Endoscopy;  Laterality: N/A;   EP IMPLANTABLE DEVICE N/A 06/18/2015   Procedure: Pacemaker Implant;  Surgeon: Evans Lance, MD;  Location: [facility];  Service: Cardiovascular;  Laterality: N/A;   ESOPHAGEAL DILATION  08/17/2020   Procedure: ESOPHAGEAL DILATION;  Surgeon: Lavena Bullion, DO;  Location: [facility];  Service: Gastroenterology;;   ESOPHAGOGASTRODUODENOSCOPY N/A 11/24/2018   Procedure: ESOPHAGOGASTRODUODENOSCOPY (EGD);  Surgeon: Yetta Flock, MD;  Location: [facility];  Service: Gastroenterology;  Laterality: N/A;   ESOPHAGOGASTRODUODENOSCOPY (EGD) WITH ESOPHAGEAL DILATION     ESOPHAGOGASTRODUODENOSCOPY (EGD) WITH PROPOFOL N/A 08/12/2015   Procedure: ESOPHAGOGASTRODUODENOSCOPY (EGD) WITH PROPOFOL;  Surgeon: Manus Gunning, MD;  Location: [facility];  Service: Gastroenterology;  Laterality: N/A;   ESOPHAGOGASTRODUODENOSCOPY (EGD) WITH PROPOFOL N/A 08/17/2020   Procedure: ESOPHAGOGASTRODUODENOSCOPY (EGD) WITH PROPOFOL;  Surgeon: Lavena Bullion, DO;  Location: [facility];  Service: Gastroenterology;  Laterality: N/A;   ESOPHAGOGASTRODUODENOSCOPY (EGD) WITH PROPOFOL N/A 06/16/2022   Procedure: ESOPHAGOGASTRODUODENOSCOPY (EGD) WITH PROPOFOL;  Surgeon: Clarene Essex, MD;  Location: [facility];  Service: Gastroenterology;  Laterality: N/A;   EYE SURGERY Bilateral    cataracts   HOT HEMOSTASIS N/A 11/24/2018   Procedure: HOT HEMOSTASIS (ARGON PLASMA COAGULATION/BICAP);  Surgeon: Yetta Flock, MD;  Location: [facility];  Service: Gastroenterology;  Laterality: N/A;   HOT HEMOSTASIS N/A  08/17/2020   Procedure: HOT HEMOSTASIS (ARGON PLASMA COAGULATION/BICAP);  Surgeon: Lavena Bullion, DO;  [facility];  Service: Gastroenterology;  Laterality: N/A;   HOT HEMOSTASIS N/A 06/16/2022   Procedure: HOT HEMOSTASIS (ARGON PLASMA COAGULATION/BICAP);  Surgeon: Clarene Essex, MD;  Location: [facility];  Service: Gastroenterology;  Laterality: N/A;   I & D EXTREMITY Right 08/06/2016   Procedure: DEBRIDEMENT PIP RIGHT RING FINGER;  Surgeon: Daryll Brod, MD;   Location: [place] [facility];  Service: Orthopedics;  Laterality: Right;   INSERT / REPLACE / REMOVE PACEMAKER     JOINT REPLACEMENT     KNEE ARTHROSCOPY Right    MASS EXCISION Right 08/06/2016   Procedure: EXCISION CYST;  Surgeon: Daryll Brod, MD;  Location: [facility];  Service: Orthopedics;  Laterality: Right;   REVISION TOTAL KNEE ARTHROPLASTY Left    TOTAL KNEE ARTHROPLASTY Bilateral     Allergies  Allergies  Allergen Reactions   Norvasc [Amlodipine Besylate] Shortness Of Breath   Other Rash and Other (See Comments)    Pt voiced she is allergic to microfiber materials/ blankets/sheets!!   Amlodipine Besylate     Other reaction(s): SOB   Avapro [Irbesartan] Other (See Comments)    Headaches   Glucophage [Metformin Hydrochloride] Other (See Comments)    "increases creatinine" and diarrhea   Lisinopril     Headache    Losartan Other (See Comments)    Headaches    Lumigan [Bimatoprost] Other (See Comments)    Eye burning and irritation   Morphine And Related Other (See Comments)    "Hallucinations"   Simvastatin Other (See Comments)    Body aches   Trulicity [Dulaglutide] Other (See Comments)    Severe mood swings    History of Present Illness    Tricia Gonzales is a PMH of paroxysmal atrial fibrillation, chronic systolic CHF, HLD, HTN, type 2 diabetes, CKD stage IV-5.  She follows with nephrology service, Dr. Vanetta Mulders.  She had a Medtronic dual-chamber pacemaker placed in 2016 due to junctional bradycardia.  Limited echocardiogram obtained 2022 showed an EF of 50-55%, moderately elevated pulmonary artery systolic pressure, moderate-severe MR.  She has history of GI bleed and Sjogren's.  She was seen by Dr. Audie Box 8/23.  During that time she was maintaining sinus rhythm and reported compliance with apixaban.  (2.5 mg twice daily).  Her blood pressure was noted to be elevated and her carvedilol was increased to 6.25 mg twice daily.  She was seen by Almyra Deforest PA-C on 03/17/2022 for  dyspnea on exertion which had been ongoing months.  She reported that her symptoms have been present since her cardiac medication was adjusted including carvedilol and lowering apixaban.  It was felt that the symptoms were unlikely due to her medication adjustment.  It was felt that her shortness of breath was due to worsening valve issue and repeat echocardiogram was recommended.  She was maintaining sinus rhythm.  Her torsemide was increased to 20 mg for 3 days.  Her echocardiogram 03/20/2022 showed an EF of 25-30%, moderate enlargement of her RV with RV systolic pressure of 53.9 mmHg, moderately dilated left and right atria, moderate-severe MR with significant artifact, moderate MAC and severe TR.  She was seen in follow-up in October.  During that time she was noted to be in paroxysmal atrial tachycardia.  Dr. Sallyanne Kuster was able to pace her out of atrial tachycardia using her pacemaker.  Her carvedilol was increased for suppression.  She was seen by Dr. Lovena Le who further  increased her carvedilol.  She followed up with Almyra Deforest PA-C in November.  During that time she was very fatigued after carvedilol increased.  Her case was discussed with Dr. Audie Box.  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  She was admitted to the hospital for further diuresis.  A limited echocardiogram at that time showed an EF of 30-35%, mild LVH, moderate reduced RV systolic function, severely elevated pulmonary artery systolic pressure, moderate-severe MR, severe TR.  Her device was interrogated and showed high RV pacing at 88%.  Again it was felt that she was having pacing induced cardiomyopathy and recommended EP to upgrade to biventricular pacemaker.  It was also recommended that she be referred to heart failure service.  Patient continued to decline.  She diuresed 7 L with IV furosemide.  Her creatinine trended up from 2.4-3.35.  GFR was too low to start SGLT2 inhibitor.  She was discharged on 40 mg daily  of torsemide along with metolazone 5 mg every Monday.  Her discharge weight was 191 pounds.    She was seen in the [facility] clinic 06/02/2022.  At that time her weight was 186 pounds.  It was again recommended that she have CRT-P upgrade.  She had GI symptoms with poor response to torsemide which was felt to be right greater than left heart failure.  She was admitted to the hospital 06/05/2022 with worsening malaise, shortness of breath and fever.  She was noted to be COVID-positive with associated pneumonia.  Her creatinine increased to 4.1.  She was noted to have atrial fibrillation with right bundle branch block and ST depression and V5 and V6.  She was admitted with GI bleed and prerenal AKI in the setting of COVID infection.  Her home torsemide and metolazone were held due to her AKI.  She declined hemodialysis.  Nephrology service recommended palliative care/hospice referral if renal function continues to decline.  She was treated with remdesivir ceftriaxone and azithromycin for her pneumonia.  GI was consulted and she underwent upper endoscopy which showed erosive gastropathy.  She required 5 units of PRBCs.  She was discharged on 1229 and it was recommended that she resume Eliquis 06/22/2022.  Her creatinine at discharge was 3.37.  Her torsemide was resumed at the time of discharge.  However, her metolazone was discontinued.  She was seen in follow-up by Almyra Deforest PA-C 07/02/2022.  She presented with her daughter.  She continued to be very fatigued and was noting worsening dyspnea.  She denied chest pain.  She felt that her breathing was getting worse since discharge.  She was noted to have 2+ pitting edema.  She complained of abdominal distention.  It was felt that she had small amount of ascites.  Abdominal ultrasound was recommended.  Chest x-ray was ordered.  She was encouraged to follow-up with nephrology for diuretic management due to her significant CKD.  She presents to the clinic today for  follow-up evaluation and states she has been working with [facility].  Her kidneys to continue to get worse.  She presents with her daughter.  She reports good urine output with her current diuretic dosing.  She is hoping to get an iron infusion next week and decide whether she is going to go into hospice or not.  We reviewed her kidney function and her heart failure.  She and her daughter expressed understanding.  She presents in a wheelchair today and reports that she feels that she does not want to  continue to go to doctors appointments.  We reviewed the importance of making decisions about advanced directives.  I will have her follow-up as needed.  Today she denies chest pain, shortness of breath, lower extremity edema, fatigue, palpitations, melena, hematuria, hemoptysis, diaphoresis, weakness, presyncope, syncope, orthopnea, and PND.    Home Medications    Prior to Admission medications   Medication Sig Start Date End Date Taking? Authorizing Provider  ACCU-CHEK AVIVA PLUS test strip 1 each by Other route daily.  03/15/15   [provider]  acetaminophen (TYLENOL) 650 MG CR tablet Take 1,300 mg by mouth every evening.    [provider]  apixaban (ELIQUIS) 2.5 MG TABS tablet Take 1 tablet (2.5 mg total) by mouth 2 (two) times daily. 06/22/22   Mariel Aloe, MD  BD PEN NEEDLE NANO U/F 32G X 4 MM MISC See admin instructions. 12/25/19   [provider]  carvedilol (COREG) 12.5 MG tablet Take 1 tablet (12.5 mg total) by mouth 2 (two) times daily with a meal. 06/19/22   Mariel Aloe, MD  Cholecalciferol (VITAMIN D3) 25 MCG (1000 UT) CAPS Take 1,000 Units by mouth daily.    [provider]  cycloSPORINE (RESTASIS) 0.05 % ophthalmic emulsion Place 1 drop into both eyes 2 (two) times daily as needed (eye dryness). Patient not taking: Reported on 07/02/2022 10/30/20   [provider]  dicyclomine (BENTYL) 10 MG capsule Take 1 capsule (10 mg total) by  mouth every 8 (eight) hours as needed for spasms. 10/07/21   Yetta Flock, MD  febuxostat (ULORIC) 40 MG tablet Take 1 tablet (40 mg total) by mouth every Monday, Wednesday, and Friday. 05/12/19   Granville Lewis C, PA-C  ferrous sulfate 325 (65 FE) MG EC tablet Take 325 mg by mouth daily with breakfast.    [provider]  gabapentin (NEURONTIN) 300 MG capsule Take 1 capsule (300 mg total) by mouth at bedtime. 06/19/22   Mariel Aloe, MD  hydrOXYzine (ATARAX) 10 MG tablet Take 10 mg by mouth 2 (two) times daily. 06/26/22   [provider]  levothyroxine (SYNTHROID) 75 MCG tablet Take 75 mcg by mouth at bedtime. 04/14/21   [provider]  lidocaine (LIDODERM) 5 % Place 1-3 patches onto the skin See admin instructions. Apply 1-3 patches transdermally every 24 hours as needed for pain and remove & discard patches within 12 hours or as directed by MD    [provider]  montelukast (SINGULAIR) 10 MG tablet Take 1 tablet (10 mg total) by mouth at bedtime. 05/12/19   Wille Celeste, PA-C  Multiple Vitamin (MULTIVITAMIN WITH MINERALS) TABS tablet Take 1 tablet by mouth daily. 06/20/22   Mariel Aloe, MD  nystatin [facility]) powder APPLY TOPICALLY TO LEFT ABDOMINAL FOLD AND GROIN TWICE A DAY Patient taking differently: Apply 1 Application topically daily as needed (Fungal infection). APPLY TOPICALLY TO LEFT ABDOMINAL FOLD AND GROIN 05/12/19   Granville Lewis C, PA-C  ondansetron (ZOFRAN-ODT) 4 MG disintegrating tablet Take 1 tablet (4 mg total) by mouth every 8 (eight) hours as needed for nausea or vomiting. 11/11/21   Armbruster, Carlota Raspberry, MD  pantoprazole (PROTONIX) 40 MG tablet Take 40 mg by mouth daily.    [provider]  pentoxifylline (TRENTAL) 400 MG CR tablet Take 400 mg by mouth daily. With a meal    [provider]  rOPINIRole (REQUIP) 0.5 MG tablet Take 0.5 mg by mouth at bedtime. 02/23/20   [provider]  rosuvastatin (CRESTOR) 5  MG tablet Take 1 tablet (5 mg total) by mouth daily. 05/12/19   Granville Lewis C, PA-C  sennosides-docusate sodium (SENOKOT-S) 8.6-50 MG tablet Take 1 tablet by mouth daily.    [provider]  SF 5000 PLUS 1.1 % CREA dental cream Take by mouth as directed. 12/13/21   [provider]  SIMBRINZA 1-0.2 % SUSP Place 1 drop into the right eye 2 (two) times daily. 03/24/22   [provider]  tamsulosin (FLOMAX) 0.4 MG CAPS capsule Take 1 capsule (0.4 mg total) by mouth daily. 06/20/22   Mariel Aloe, MD  torsemide (DEMADEX) 20 MG tablet Take 2 tablets (40 mg total) by mouth daily. 03/27/22   Almyra Deforest, PA  traMADol (ULTRAM) 50 MG tablet Take 1 tablet (50 mg total) by mouth 2 (two) times daily. 06/19/22   Mariel Aloe, MD  TRESIBA FLEXTOUCH 100 UNIT/ML FlexTouch Pen Inject 8-10 Units into the skin every evening. 02/21/20   [provider]    Family History    Family History  Problem Relation Age of Onset   Diabetes Mother    Hypertension Sister    Diabetes Brother    Colon cancer Neg Hx    Heart attack Neg Hx    Stroke Neg Hx    Colon polyps Neg Hx    Esophageal cancer Neg Hx    Rectal cancer Neg Hx    Stomach cancer Neg Hx    She indicated that her mother is deceased. She indicated that her father is deceased. She indicated that both of her sisters are alive. She indicated that her brother is deceased. She indicated that her maternal grandmother is deceased. She indicated that her maternal grandfather is deceased. She indicated that her paternal grandmother is deceased. She indicated that her paternal grandfather is deceased. She indicated that the status of her neg hx is unknown.  Social History    Social History   Socioeconomic History   Marital status: Divorced    Spouse name: Not on file   Number of children: 2   Years of education: Not on file   Highest education level: High school graduate  Occupational History   Occupation: retired  Tobacco  Use   Smoking status: Former    Packs/day: 2.00    Years: 3.00    Total pack years: 6.00    Types: Cigarettes    Quit date: 06/23/1983    Years since quitting: 39.1    Passive exposure: Never   Smokeless tobacco: Never  Vaping Use   Vaping Use: Never used  Substance and Sexual Activity   Alcohol use: Yes    Alcohol/week: 2.0 standard drinks of alcohol    Types: 2 Glasses of wine per week    Comment: occ   Drug use: No   Sexual activity: Not Currently  Other Topics Concern   Not on file  Social History Narrative   Not on file   Social Determinants of Health   Financial Resource Strain: Low Risk  (05/22/2022)   Overall Financial Resource Strain (CARDIA)    Difficulty of Paying Living Expenses: Not hard at all  Food Insecurity: No Food Insecurity (06/06/2022)   Hunger Vital Sign    Worried About Running Out of Food in the Last Year: Never true    Ran Out of Food in the Last Year: Never true  Transportation Needs: No Transportation Needs (06/06/2022)   PRAPARE - Transportation  Lack of Transportation (Medical): No    Lack of Transportation (Non-Medical): No  Physical Activity: Not on file  Stress: Not on file  Social Connections: Not on file  Intimate Partner Violence: Not At Risk (06/06/2022)   Humiliation, Afraid, Rape, and Kick questionnaire    Fear of Current or Ex-Partner: No    Emotionally Abused: No    Physically Abused: No    Sexually Abused: No     Review of Systems    General:  No chills, fever, night sweats or weight changes.  Cardiovascular:  No chest pain, dyspnea on exertion, edema, orthopnea, palpitations, paroxysmal nocturnal dyspnea. Dermatological: No rash, lesions/masses Respiratory: No cough, dyspnea Urologic: No hematuria, dysuria Abdominal:   No nausea, vomiting, diarrhea, bright red blood per rectum, melena, or hematemesis Neurologic:  No visual changes, wkns, changes in mental status. All other systems reviewed and are otherwise negative  except as noted above.  Physical Exam    VS:  BP 136/66   Pulse 60   Ht _0  (1.473 m)   Wt 201 lb 6.4 oz (91.4 kg) Comment: per patient  SpO2 97%   BMI 42.09 kg/m  , BMI Body mass index is 42.09 kg/m. GEN: Well nourished, well developed, in no acute distress. HEENT: normal. Neck: Supple, no JVD, carotid bruits, or masses. Cardiac: RRR, no murmurs, rubs, or gallops. No clubbing, cyanosis, bilateral lower extremity 1-2+ pitting edema.  Radials/DP/PT 2+ and equal bilaterally.  Respiratory:  Respirations regular and unlabored, clear to auscultation bilaterally. GI: Soft, nontender, nondistended, BS + x 4. MS: no deformity or atrophy. Skin: warm and dry, no rash. Neuro:  Strength and sensation are intact. Psych: Normal affect.  Accessory Clinical Findings    Recent Labs: 04/02/2022: NT-Pro BNP 69,331 05/22/2022: TSH 2.716 06/06/2022: ALT 12 06/11/2022: B Natriuretic Peptide 2,120.6; Magnesium 2.1 06/17/2022: BUN 152; Creatinine, Ser 3.37; Potassium 4.5; Sodium 135 07/16/2022: Hemoglobin 8.1; Platelet Count 200   Recent Lipid Panel No results found for: "CHOL", "TRIG", "HDL", "CHOLHDL", "VLDL", "LDLCALC", "LDLDIRECT"       ECG personally reviewed by me today-none today.  Echocardiogram 05/11/2022 IMPRESSIONS     1. Left ventricular ejection fraction, by estimation, is 30 to 35%. The  left ventricle demonstrates global hypokinesis. There is mild concentric  left ventricular hypertrophy. Left ventricular diastolic function could  not be evaluated.   2. Right ventricular systolic function is moderately reduced. The right  ventricular size is moderately enlarged. There is severely elevated  pulmonary artery systolic pressure.   3. The mitral valve is normal in structure. Moderate to severe mitral  valve regurgitation. No evidence of mitral stenosis. Moderate mitral  annular calcification.   4. Tricuspid valve regurgitation is severe.   5. The aortic valve is normal in  structure. There is mild calcification  of the aortic valve. Aortic valve regurgitation is not visualized. Aortic  valve sclerosis is present, with no evidence of aortic valve stenosis.   6. The inferior vena cava is normal in size with greater than 50%  respiratory variability, suggesting right atrial pressure of 3 mmHg.   Comparison(s): 03/20/22 EF 25-30%. PA pressure 63mHg. Moderate-severe MR.  RV moderately enlarged.   FINDINGS   Left Ventricle: Left ventricular ejection fraction, by estimation, is 30  to 35%. The left ventricle demonstrates global hypokinesis. The left  ventricular internal cavity size was normal in size. There is mild  concentric left ventricular hypertrophy.  Left ventricular diastolic function could not be evaluated. Left  ventricular diastolic  function could not be evaluated due to mitral  annular calcification (moderate or greater).   Right Ventricle: The right ventricular size is moderately enlarged. No  increase in right ventricular wall thickness. Right ventricular systolic  function is moderately reduced. There is severely elevated pulmonary  artery systolic pressure. The tricuspid  regurgitant velocity is 3.44 m/s, and with an assumed right atrial  pressure of 15 mmHg, the estimated right ventricular systolic pressure is  94.1 mmHg.   Left Atrium: Left atrial size was normal in size.   Right Atrium: Right atrial size was normal in size.   Pericardium: There is no evidence of pericardial effusion.   Mitral Valve: The mitral valve is normal in structure. Moderate mitral  annular calcification. Moderate to severe mitral valve regurgitation, with  posteriorly-directed jet. No evidence of mitral valve stenosis.   Tricuspid Valve: The tricuspid valve is normal in structure. Tricuspid  valve regurgitation is severe. No evidence of tricuspid stenosis.   Aortic Valve: The aortic valve is normal in structure. There is mild  calcification of the aortic  valve. Aortic valve regurgitation is not  visualized. Aortic valve sclerosis is present, with no evidence of aortic  valve stenosis.   Pulmonic Valve: The pulmonic valve was normal in structure. Pulmonic valve  regurgitation is not visualized. No evidence of pulmonic stenosis.   Aorta: The aortic root is normal in size and structure.   Venous: The inferior vena cava is normal in size with greater than 50%  respiratory variability, suggesting right atrial pressure of 3 mmHg.   IAS/Shunts: No atrial level shunt detected by color flow Doppler.    Assessment & Plan   1.  Shortness of breath, acute on chronic systolic CHF-continues to have 1-2+ bilateral lower extremity pitting edema.  Echocardiogram 05/11/2022 showed improved EF to 30-35%, right ventricle moderately dilated, moderate-severe mitral valve regurgitation.  Reviewed kidney function and heart failure.  She reports that she will try a iron infusion next week before making decision about whether to go to hospice or not. Continue torsemide, carvedilol Heart healthy low-sodium diet Increase physical activity as tolerated Request lab work from [facility] Request chest x-ray and abdominal ultrasound from rehab center  Essential hypertension-BP today 136/66. Continue to monitor Continue current medical therapy  PPM-noted to have recent drop in EF which was felt to be pacing induced.  Continues to follow with EP.  Previously felt that she would benefit from [place] PPM upgrade.  However due to frailty she is not a good candidate for surgery.  CKD stage IV-continues to follow with nephrology.  Nephrology is managing diuretic therapy.  Has been referred to hospice for worsening renal function.  She has declined dialysis.  She indicated that she would like to return home from nursing facility.  Disposition: Follow-up with Dr. Audie Box or Almyra Deforest PA-C PRN.   Jossie Ng. Haani Bakula NP-C     07/24/2022, 2:32 PM [place] [address] Office [phone] Fax [phone]    I spent 16 minutes examining this patient, reviewing medications, and using patient centered shared decision making involving her cardiac care.  Prior to her visit I spent greater than 20 minutes reviewing her past medical history,  medications, and prior cardiac tests.

## 2022-07-23 ENCOUNTER — Telehealth: Payer: Self-pay | Admitting: *Deleted

## 2022-07-23 NOTE — Telephone Encounter (Signed)
   Pre-operative Risk Assessment    Patient Name: Tricia Gonzales  DOB: March 08, 1941 MRN: [id number]     Request for Surgical Clearance    Procedure:   prophylaxis and subgingival probing/descaling  Date of Surgery:  Clearance 09/02/22                                 Surgeon:  Dr. Mirna Mires DDS/Dr. Estevan Oaks  Surgeon's Group or Practice Name:  [facility] Phone number:  [phone] Fax number:  [phone]   Type of Clearance Requested:   - Medical  - Pharmacy:  Hold Apixaban (Eliquis) .   Type of Anesthesia:  Not Indicated   Additional requests/questions:  Does this patient need antibiotics?  SignedSilverio Lay   07/23/2022, 2:56 PM

## 2022-07-24 ENCOUNTER — Ambulatory Visit: Payer: Medicare Other | Attending: General Practice | Admitting: General Practice

## 2022-07-24 ENCOUNTER — Encounter: Payer: Self-pay | Admitting: General Practice

## 2022-07-24 VITALS — BP 136/66 | HR 60 | Ht <= 58 in | Wt 201.4 lb

## 2022-07-24 DIAGNOSIS — I5023 Acute on chronic systolic (congestive) heart failure: Secondary | ICD-10-CM | POA: Diagnosis not present

## 2022-07-24 DIAGNOSIS — I10 Essential (primary) hypertension: Secondary | ICD-10-CM

## 2022-07-24 DIAGNOSIS — Z950 Presence of cardiac pacemaker: Secondary | ICD-10-CM

## 2022-07-24 DIAGNOSIS — R0602 Shortness of breath: Secondary | ICD-10-CM

## 2022-07-24 DIAGNOSIS — N184 Chronic kidney disease, stage 4 (severe): Secondary | ICD-10-CM

## 2022-07-24 NOTE — Telephone Encounter (Signed)
   Patient Name: Tricia Gonzales  DOB: 1940/07/25 MRN: [id number]  Primary Cardiologist: Evalina Field, MD  Chart reviewed as part of pre-operative protocol coverage.   Simple dental extractions (i.e. 1-2 teeth), cleaning and crowns are considered low risk procedures per guidelines and generally do not require any specific cardiac clearance. It is also generally accepted that for simple extractions and dental cleanings, there is no need to interrupt blood thinner therapy.  Her overall baseline condition is very poor, she should not receive any sedation or anesthesia as she will be high risk with any medication that affect her respiration.   SBE prophylaxis is not required for the patient from a cardiac standpoint.  I will route this recommendation to the requesting party via Epic fax function and remove from pre-op pool.  Please call with questions.  Violet, [place] 07/24/2022, 3:07 PM

## 2022-07-24 NOTE — Telephone Encounter (Signed)
If it is minor dental cleaning or subgingival minor procedure, I think she should be able to handle it.  However her overall baseline condition is very poor, she should not receive any sedation or anesthesia as she will be high risk with any medication that affect her respiration.

## 2022-07-24 NOTE — Telephone Encounter (Signed)
No need to hold anticoauglation

## 2022-07-24 NOTE — Patient Instructions (Signed)
Medication Instructions:  No changes *If you need a refill on your cardiac medications before your next appointment, please call your pharmacy*  Follow-Up: At [facility], you and your health needs are our priority.  As part of our continuing mission to provide you with exceptional heart care, we have created designated Provider Care Teams.  These Care Teams include your primary Cardiologist (physician) and Advanced Practice Providers (APPs -  Physician Assistants and Nurse Practitioners) who all work together to provide you with the care you need, when you need it.  We recommend signing up for the patient portal called "MyChart".  Sign up information is provided on this After Visit Summary.  MyChart is used to connect with patients for Virtual Visits (Telemedicine).  Patients are able to view lab/test results, encounter notes, upcoming appointments, etc.  Non-urgent messages can be sent to your provider as well.   To learn more about what you can do with MyChart, go to [email or web address].    Your next appointment:    Follow up as needed  Provider:   Dr Audie Box or Vevelyn Pat, PA

## 2022-07-24 NOTE — Telephone Encounter (Signed)
Clinical pharmacist to review, I suspect no need to hold blood thinner for subgingival probing and descaling.  She does not need SBE prophylaxis.

## 2022-07-24 NOTE — Telephone Encounter (Signed)
[name] 3 times to request results and 2 times is was disconnected and the 3rd time, the receptionist said that she would "walk over a note" to Medical records asking them to call us back in regards to Tricia Gonzales. Number provided- [phone] Provider requesting most recent lab work, Chest X-Ray, and Abdominal Ultrasound.

## 2022-07-28 ENCOUNTER — Telehealth: Payer: Self-pay | Admitting: Cardiovascular Disease

## 2022-07-28 ENCOUNTER — Ambulatory Visit (INDEPENDENT_AMBULATORY_CARE_PROVIDER_SITE_OTHER)

## 2022-07-28 ENCOUNTER — Encounter (HOSPITAL_COMMUNITY): Payer: Self-pay | Admitting: Gastroenterology

## 2022-07-28 DIAGNOSIS — I495 Sick sinus syndrome: Secondary | ICD-10-CM

## 2022-07-28 NOTE — Telephone Encounter (Signed)
Daughter stated an order for a humidifier was to be placed for the patient and she is following up on getting that equipment.  Daughter stated she would like a call back to discuss getting this equipment today.

## 2022-07-28 NOTE — Telephone Encounter (Signed)
Called pt's daughter. She wants to know can they get the humidifier bottles for the O2 tanks. The daughter has been trying for over a week to get this bottles. They were seen Friday "it seemed Denyse Amass was going to order them but I don't think it was ordered." Will get message to Dr. Audie Box and Denyse Amass for further instructions.

## 2022-07-29 ENCOUNTER — Telehealth: Payer: Self-pay | Admitting: Physician Assistant

## 2022-07-29 NOTE — Telephone Encounter (Signed)
Called patient, advised of message from MD.  They stated hospice is coming in today and they will check with them.   Patient verbalized understanding.

## 2022-07-29 NOTE — Telephone Encounter (Signed)
Follow Up:    Mariann Laster from [facility] is calling back. She is calling to check on the order that was sent on 07-14-22 and resent today(07-29-22). It was [place] for [place] from 05-31-22 to 07-29-22. The order # is [id number] Please fax to [phone].

## 2022-07-30 ENCOUNTER — Encounter (HOSPITAL_COMMUNITY): Payer: Self-pay | Admitting: *Deleted

## 2022-08-03 ENCOUNTER — Inpatient Hospital Stay: Payer: Medicare Other | Admitting: Oncology

## 2022-08-03 ENCOUNTER — Inpatient Hospital Stay: Payer: Medicare Other

## 2022-08-04 NOTE — Telephone Encounter (Signed)
Mariann Laster from [facility] is calling to get update on this order. She states that they are coming to the end of deadline for order and need this sent in as soon as possible.

## 2022-08-07 ENCOUNTER — Encounter: Payer: Medicare Other | Admitting: Physician Assistant

## 2022-08-10 ENCOUNTER — Inpatient Hospital Stay: Payer: Medicare Other

## 2022-08-11 NOTE — Telephone Encounter (Signed)
I received forms, will have them signed and faxed off today 02/20.  Thanks!

## 2022-08-13 NOTE — Telephone Encounter (Signed)
Tricia Gonzales is following up. She states the forms still have not been received. She is requesting to have them faxed again if possible.  Fax#: [phone]

## 2022-08-17 NOTE — Telephone Encounter (Signed)
Faxed over forms today 02/26

## 2022-08-21 DEATH — deceased

## 2022-09-01 NOTE — Progress Notes (Signed)
Remote pacemaker transmission.   

## 2022-09-28 ENCOUNTER — Encounter: Payer: Self-pay | Admitting: Oncology

## 2022-10-20 ENCOUNTER — Encounter: Payer: Self-pay | Admitting: Oncology
